# Patient Record
Sex: Female | Born: 1945 | Race: White | Hispanic: No | Smoking: Never smoker
Health system: Southern US, Community
[De-identification: ages and names within clinical notes are randomized; demographics above are authoritative.]

## PROBLEM LIST (undated history)

## (undated) DIAGNOSIS — K219 Gastro-esophageal reflux disease without esophagitis: Secondary | ICD-10-CM

## (undated) DIAGNOSIS — F32A Depression, unspecified: Secondary | ICD-10-CM

## (undated) DIAGNOSIS — C259 Malignant neoplasm of pancreas, unspecified: Secondary | ICD-10-CM

## (undated) DIAGNOSIS — I2699 Other pulmonary embolism without acute cor pulmonale: Secondary | ICD-10-CM

## (undated) DIAGNOSIS — F329 Major depressive disorder, single episode, unspecified: Secondary | ICD-10-CM

## (undated) DIAGNOSIS — M1990 Unspecified osteoarthritis, unspecified site: Secondary | ICD-10-CM

## (undated) DIAGNOSIS — K863 Pseudocyst of pancreas: Secondary | ICD-10-CM

## (undated) DIAGNOSIS — F419 Anxiety disorder, unspecified: Secondary | ICD-10-CM

## (undated) DIAGNOSIS — I499 Cardiac arrhythmia, unspecified: Secondary | ICD-10-CM

## (undated) HISTORY — DX: Other pulmonary embolism without acute cor pulmonale: I26.99

---

## 1972-01-26 HISTORY — PX: TUBAL LIGATION: SHX77

## 2015-10-30 ENCOUNTER — Other Ambulatory Visit: Payer: Self-pay | Admitting: Neurosurgery

## 2015-12-08 ENCOUNTER — Encounter (HOSPITAL_COMMUNITY): Payer: Self-pay | Admitting: *Deleted

## 2015-12-08 ENCOUNTER — Encounter (HOSPITAL_COMMUNITY)
Admission: RE | Admit: 2015-12-08 | Discharge: 2015-12-08 | Disposition: A | Discharge status: Home / Self Care | Payer: Medicare Other | Source: Ambulatory Visit | Attending: Neurosurgery | Admitting: Neurosurgery

## 2015-12-08 DIAGNOSIS — Z01812 Encounter for preprocedural laboratory examination: Secondary | ICD-10-CM | POA: Diagnosis present

## 2015-12-08 HISTORY — DX: Gastro-esophageal reflux disease without esophagitis: K21.9

## 2015-12-08 HISTORY — DX: Unspecified osteoarthritis, unspecified site: M19.90

## 2015-12-08 HISTORY — DX: Anxiety disorder, unspecified: F41.9

## 2015-12-08 HISTORY — DX: Depression, unspecified: F32.A

## 2015-12-08 HISTORY — DX: Cardiac arrhythmia, unspecified: I49.9

## 2015-12-08 HISTORY — DX: Major depressive disorder, single episode, unspecified: F32.9

## 2015-12-08 LAB — CBC
HCT: 42.9 % (ref 36.0–46.0)
Hemoglobin: 14.3 g/dL (ref 12.0–15.0)
MCH: 30.7 pg (ref 26.0–34.0)
MCHC: 33.3 g/dL (ref 30.0–36.0)
MCV: 92.1 fL (ref 78.0–100.0)
Platelets: 212 10*3/uL (ref 150–400)
RBC: 4.66 MIL/uL (ref 3.87–5.11)
RDW: 13.2 % (ref 11.5–15.5)
WBC: 5.3 10*3/uL (ref 4.0–10.5)

## 2015-12-08 LAB — BASIC METABOLIC PANEL
Anion gap: 7 (ref 5–15)
BUN: 21 mg/dL — ABNORMAL HIGH (ref 6–20)
CO2: 27 mmol/L (ref 22–32)
Calcium: 9 mg/dL (ref 8.9–10.3)
Chloride: 106 mmol/L (ref 101–111)
Creatinine, Ser: 0.72 mg/dL (ref 0.44–1.00)
GFR calc Af Amer: >60 mL/min (ref >60)
GFR calc non Af Amer: >60 mL/min (ref >60)
Glucose, Bld: 92 mg/dL (ref 65–99)
Potassium: 4 mmol/L (ref 3.5–5.1)
Sodium: 140 mmol/L (ref 135–145)

## 2015-12-08 LAB — SURGICAL PCR SCREEN
MRSA, PCR: NEGATIVE
Staphylococcus aureus: NEGATIVE

## 2015-12-08 NOTE — Pre-Procedure Instructions (Addendum)
Sheryl Bender  12/08/2015      [id number] [place], [place] - [place] [address] Phone: [phone] Fax: [phone]    Your procedure is scheduled on 12/16/15.  Report to [facility] Admitting at 630 A.M.  Call this number if you have problems the morning of surgery:  [phone]   Remember:  Do not eat food or drink liquids after midnight.  Take these medicines the morning of surgery with A SIP OF WATER lexapro,fluvixamine,ativan if needed, metoprolol, seroquel  STOP all herbel meds, nsaids (aleve,naproxen,advil,ibuprofen) starting 12/09/15 tomorrow including aspirin,vitamins   Do not wear jewelry, make-up or nail polish.  Do not wear lotions, powders, or perfumes, or deoderant.  Do not shave 48 hours prior to surgery.  Men may shave face and neck.  Do not bring valuables to the hospital.  [facility] is not responsible for any belongings or valuables.  Contacts, dentures or bridgework may not be worn into surgery.  Leave your suitcase in the car.  After surgery it may be brought to your room.  For patients admitted to the hospital, discharge time will be determined by your treatment team.  Patients discharged the day of surgery will not be allowed to drive home.    Special instructions:   Special Instructions: [place] - Preparing for Surgery  Before surgery, you can play an important role.  Because skin is not sterile, your skin needs to be as free of germs as possible.  You can reduce the number of germs on you skin by washing with CHG (chlorahexidine gluconate) soap before surgery.  CHG is an antiseptic cleaner which kills germs and bonds with the skin to continue killing germs even after washing.  Please DO NOT use if you have an allergy to CHG or antibacterial soaps.  If your skin becomes reddened/irritated stop using the CHG and inform your nurse when you arrive at Short Stay.  Do not shave (including legs  and underarms) for at least 48 hours prior to the first CHG shower.  You may shave your face.  Please follow these instructions carefully:   1.  Shower with CHG Soap the night before surgery and the morning of Surgery.  2.  If you choose to wash your hair, wash your hair first as usual with your normal shampoo.  3.  After you shampoo, rinse your hair and body thoroughly to remove the Shampoo.  4.  Use CHG as you would any other liquid soap.  You can apply chg directly  to the skin and wash gently with scrungie or a clean washcloth.  5.  Apply the CHG Soap to your body ONLY FROM THE NECK DOWN.  Do not use on open wounds or open sores.  Avoid contact with your eyes ears, mouth and genitals (private parts).  Wash genitals (private parts)       with your normal soap.  6.  Wash thoroughly, paying special attention to the area where your surgery will be performed.  7.  Thoroughly rinse your body with warm water from the neck down.  8.  DO NOT shower/wash with your normal soap after using and rinsing off the CHG Soap.  9.  Pat yourself dry with a clean towel.            10.  Wear clean pajamas.            11.  Place clean sheets on your  bed the night of your first shower and do not sleep with pets.  Day of Surgery  Do not apply any lotions/deodorants the morning of surgery.  Please wear clean clothes to the hospital/surgery center.  Please read over the  fact sheets that you were given.

## 2015-12-09 NOTE — H&P (Signed)
Patient ID:   [id number] [name]  Date of Birth: May 04, 1945 Visit Type: Office Visit   Date: 10/29/2015 08:30 AM Provider: Marchia Meiers. Vertell Limber MD   This 70 year old female presents for back pain.  History of Present Illness: 1.  back pain    Sheryl Bender, 70 year old retired female visits for evaluation.  Initially seen in [place] by Dr. Willey Blade, who left the practice; then seen by Dr. Leafy Kindle who passed away, she visits to discuss right buttock, right lower extremity, right ankle pain increasing since March 2016.  Dr. Leafy Kindle had recommended L4-L5 laminectomies with Coflex. Symptoms began in his right buttock pain radiating to the ankle with increased activity.  Lately, the pain has increased in frequency and severity and includes supine positioning. Pain is an 8/10 today and increases as she walks. She has had a bone density scan done which showed no evidence of osteopenia or osteoporosis.  Of note, patient was voluntarily committed for depression and anxiety August 16 through the 24th in [place].  Symptoms are well controlled at present on medication.  Unfortunately, her pain has not been well controlled recently due to fear of medication interactions.  She was instructed to take only one naproxen per day which offered little benefit.  ESI x1 offered no relief Physical therapy caused increased pain  History: Tachycardia, anxiety/depression Surgical history: Tubal ligation 1974  X-rays on [place] reveal L3-4, L4-5, L5-S1 degeneration and arthritis, L5-S1 is sacralized, L4-5 anterolisthesis , L3-4 retrolisthesis, and L2-3 anterolisthesis  MRI reveals mulitlevel degeneration and arthritis at L2-3, L3-4, and L4-5, L4-5 foraminal stenosis worse on the right      Medical/Surgical/Interim History Reviewed, no change.   PAST MEDICAL HISTORY, SURGICAL HISTORY, FAMILY HISTORY, SOCIAL HISTORY AND REVIEW OF SYSTEMS  10/29/2015, which I have signed.  Family History: Reviewed,  no changes.    Social History: Tobacco use reviewed. Reviewed, no changes.     MEDICATIONS(added, continued or stopped this visit): Started Medication Directions Instruction Stopped   Fish Oil 120 mg-180 mg capsule      Glucosamine     10/28/2015 lorazepam 0.5 mg tablet take 1 tablet by oral route 2 times every day as needed     metoprolol succinate ER 25 mg tablet,extended release 24 hr take 1 tablet by oral route  every day     venlafaxine 50 mg tablet take 1 tablet by oral route  every day     vitamin d2        ALLERGIES: Ingredient Reaction Medication Name Comment  NO KNOWN ALLERGIES     No known allergies.    Vitals Date Temp F BP Pulse Ht In Wt Lb BMI BSA Pain Score  10/29/2015  111/71 68 63 145 25.69  8/10     PHYSICAL EXAM General Level of Distress: no acute distress Overall Appearance: normal    Cardiovascular Cardiac: regular rate and rhythm without murmur  Respiratory Lungs: clear to auscultation  Neurological Recent and Remote Memory: normal Attention Span and Concentration:   normal Language: normal Fund of Knowledge: normal  Right Left Sensation: normal normal Upper Extremity Coordination: normal normal  Lower Extremity Coordination: normal normal  Musculoskeletal Gait and Station: normal  Right Left Upper Extremity Muscle Strength: normal normal Lower Extremity Muscle Strength: normal normal Upper Extremity Muscle Tone:  normal normal Lower Extremity Muscle Tone: normal normal  Motor Strength Upper and lower extremity motor strength was tested in the clinically pertinent muscles. Any abnormal findings will be noted below.  Right Left EHL: 4-/5    Deep Tendon Reflexes  Right Left Biceps: normal normal Triceps: normal normal Brachiloradialis: normal normal Patellar: normal normal Achilles: normal normal  Sensory Sensation was tested at L1 to S1.   Cranial Nerves II. Optic Nerve/Visual Fields: normal III.  Oculomotor: normal IV. Trochlear: normal V. Trigeminal: normal VI. Abducens: normal VII. Facial: normal VIII. Acoustic/Vestibular: normal IX. Glossopharyngeal: normal X. Vagus: normal XI. Spinal Accessory: normal XII. Hypoglossal: normal  Motor and other Tests Lhermittes: negative Rhomberg: negative    Right Left Hoffman's: normal normal Clonus: normal normal Babinski: normal normal SLR: positive negative Patrick's Corky Sox): negative negative Toe Walk: normal normal Toe Lift: normal normal Heel Walk: normal normal SI Joint: nontender nontender   Additional Findings:  Right sciatic notch discomfort, toe touch 16 inches from floor, weak with right leg squat, UE strength is normal, 4/5 right dorsiflexion and hip abductor weakness, symmetric reflexes, hyperesthesia in right LE, positive SLR of 45 degrees on the right   DIAGNOSTIC RESULTS X-rays on [place]opy reveal L3-4, L4-5, L5-S1 degeneration and arthritis, L5-S1 is sacralized, L4-5 anterolisthesis , L3-4 retrolisthesis, and L2-3 anterolisthesis  MRI reveals mulitlevel degeneration and arthritis at L2-3, L3-4, and L4-5, L4-5 foraminal stenosis worse on the right    IMPRESSION The patient is experiencing right buttock and right leg pain that radiates into her ankle. On review of her imaging, she has significant degeneration of L2-3, L3-4, and L4-5 with foraminal stenosis at L4-5 on the right that is causing nerve compression. On confrontational testing, she has right sciatic notch discomfort, right EHL, dorsiflexion, and hip abductor weakness, hyperesthesia in her right LE, and positive SLR on the right. She has tried an epidural injection which offered no relief and PT only caused increased pain. Due to her weakness and significant lumbar degeneration, I recommend an L2-3, L3-4, L4-5 XLIF from the right with percutaneous pedicle screw fixation for alleviation of her right leg symptoms. She has had a bone density scan in the last  year or two, which showed no evidence of osteopenia or osteoporosis. If we are unable to perform an XLIF at L 45, then I would recommend an MIS TLIF at this level from the right.  Completed Orders (this encounter) Order Details Reason Side Interpretation Result Initial Treatment Date Region  Lumbar Spine- AP/Lat/Obls/Spot/Flex/Ex      10/29/2015 All Levels to All Levels  Scoliosis- AP/Lat      10/29/2015 All Levels to All Levels   Assessment/Plan # Detail Type Description   1. Assessment Lumbar radiculopathy (M54.16).       2. 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       3. 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       4. 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         Pain Assessment/Treatment Pain Scale: 8/10. Method: Numeric Pain Intensity Scale. Location: back. Onset: 04/11/2015. Duration: varies. Quality: discomforting. Pain Assessment/Treatment follow-up plan of care: Patient taking over the counter medication for relief..  Fall Risk Plan The patient has not fallen in the last year.  We got scoliosis X-rays today. Schedule L2-3, L3-4, and L4-5 XLIF. Nurse education given. Fitted for LSO brace.   Orders: Diagnostic Procedures: Assessment Procedure  M41.20 Scoliosis- AP/Lat  M54.16 Lumbar Spine- AP/Lat  M54.16 Lumbar Spine- AP/Lat/Obls/Spot/Flex/Ex             Provider:  Marchia Meiers. Vertell Limber MD  10/29/2015 10:34 AM Dictation edited by: Johnella Moloney    CC Providers: Milbert Coulter  [facility] [address] [place] [address]              Electronically signed by Marchia Meiers. Vertell Limber MD on 10/30/2015 05:16 PM

## 2015-12-16 ENCOUNTER — Inpatient Hospital Stay (HOSPITAL_COMMUNITY): Payer: Medicare Other

## 2015-12-16 ENCOUNTER — Inpatient Hospital Stay (HOSPITAL_COMMUNITY): Payer: Medicare Other | Admitting: Certified Registered Nurse Anesthetist

## 2015-12-16 ENCOUNTER — Inpatient Hospital Stay (HOSPITAL_COMMUNITY)
Admission: RE | Admit: 2015-12-16 | Discharge: 2015-12-19 | DRG: 458 | Disposition: A | Discharge status: Home Health | Payer: Medicare Other | Source: Ambulatory Visit | Attending: Neurosurgery | Admitting: Neurosurgery

## 2015-12-16 ENCOUNTER — Encounter (HOSPITAL_COMMUNITY)
Admission: RE | Disposition: A | Discharge status: Home Health | Payer: Self-pay | Source: Ambulatory Visit | Attending: Neurosurgery

## 2015-12-16 ENCOUNTER — Encounter (HOSPITAL_COMMUNITY): Payer: Self-pay | Admitting: General Practice

## 2015-12-16 ENCOUNTER — Inpatient Hospital Stay (HOSPITAL_COMMUNITY): Payer: Medicare Other | Admitting: Emergency Medicine

## 2015-12-16 DIAGNOSIS — M4126 Other idiopathic scoliosis, lumbar region: Secondary | ICD-10-CM | POA: Diagnosis present

## 2015-12-16 DIAGNOSIS — M4316 Spondylolisthesis, lumbar region: Secondary | ICD-10-CM | POA: Diagnosis present

## 2015-12-16 DIAGNOSIS — M48061 Spinal stenosis, lumbar region without neurogenic claudication: Secondary | ICD-10-CM | POA: Diagnosis present

## 2015-12-16 DIAGNOSIS — F419 Anxiety disorder, unspecified: Secondary | ICD-10-CM | POA: Diagnosis present

## 2015-12-16 DIAGNOSIS — M419 Scoliosis, unspecified: Secondary | ICD-10-CM | POA: Diagnosis present

## 2015-12-16 DIAGNOSIS — F329 Major depressive disorder, single episode, unspecified: Secondary | ICD-10-CM | POA: Diagnosis present

## 2015-12-16 DIAGNOSIS — M5116 Intervertebral disc disorders with radiculopathy, lumbar region: Secondary | ICD-10-CM | POA: Diagnosis present

## 2015-12-16 DIAGNOSIS — Z419 Encounter for procedure for purposes other than remedying health state, unspecified: Secondary | ICD-10-CM

## 2015-12-16 DIAGNOSIS — M549 Dorsalgia, unspecified: Secondary | ICD-10-CM | POA: Diagnosis present

## 2015-12-16 HISTORY — PX: ANTERIOR LAT LUMBAR FUSION: SHX1168

## 2015-12-16 HISTORY — PX: LUMBAR PERCUTANEOUS PEDICLE SCREW 3 LEVEL: SHX5562

## 2015-12-16 LAB — ABO/RH: ABO/RH(D): A POS

## 2015-12-16 LAB — TYPE AND SCREEN
ABO/RH(D): A POS
Antibody Screen: NEGATIVE

## 2015-12-16 SURGERY — ANTERIOR LATERAL LUMBAR FUSION 3 LEVELS
Anesthesia: General | Site: Flank | Laterality: Right

## 2015-12-16 MED ORDER — SENNOSIDES-DOCUSATE SODIUM 8.6-50 MG PO TABS
1.0000 | ORAL_TABLET | Freq: Every evening | ORAL | Status: DC | PRN
  Administered 2015-12-19: 1 via ORAL
  Filled 2015-12-16: qty 1

## 2015-12-16 MED ORDER — SODIUM CHLORIDE 0.9 % IV SOLN
0.0125 ug/kg/min | INTRAVENOUS | Status: AC
  Administered 2015-12-16: 11:00:00 via INTRAVENOUS
  Administered 2015-12-16: .2 ug/kg/min via INTRAVENOUS
  Filled 2015-12-16: qty 2000

## 2015-12-16 MED ORDER — GLYCOPYRROLATE 0.2 MG/ML IJ SOLN
INTRAMUSCULAR | Status: DC | PRN
  Administered 2015-12-16 (×2): 0.2 mg via INTRAVENOUS

## 2015-12-16 MED ORDER — FENTANYL CITRATE (PF) 100 MCG/2ML IJ SOLN
INTRAMUSCULAR | Status: AC
  Filled 2015-12-16: qty 2

## 2015-12-16 MED ORDER — LIDOCAINE HCL (CARDIAC) 20 MG/ML IV SOLN
INTRAVENOUS | Status: DC | PRN
  Administered 2015-12-16: 80 mg via INTRAVENOUS

## 2015-12-16 MED ORDER — OXYCODONE-ACETAMINOPHEN 5-325 MG PO TABS
1.0000 | ORAL_TABLET | ORAL | Status: DC | PRN
  Administered 2015-12-17 – 2015-12-19 (×9): 2 via ORAL
  Filled 2015-12-16 (×9): qty 2

## 2015-12-16 MED ORDER — BUPIVACAINE HCL (PF) 0.5 % IJ SOLN
INTRAMUSCULAR | Status: AC
  Filled 2015-12-16: qty 30

## 2015-12-16 MED ORDER — PROMETHAZINE HCL 25 MG/ML IJ SOLN
6.2500 mg | INTRAMUSCULAR | Status: DC | PRN
  Administered 2015-12-16: 6.25 mg via INTRAVENOUS

## 2015-12-16 MED ORDER — SODIUM CHLORIDE 0.9 % IV SOLN: 250.0000 mL | INTRAVENOUS | Status: DC

## 2015-12-16 MED ORDER — CEFAZOLIN SODIUM-DEXTROSE 2-4 GM/100ML-% IV SOLN
2.0000 g | INTRAVENOUS | Status: AC
  Administered 2015-12-16: 2 g via INTRAVENOUS
  Filled 2015-12-16: qty 100

## 2015-12-16 MED ORDER — HYDROMORPHONE HCL 1 MG/ML IJ SOLN
0.5000 mg | INTRAMUSCULAR | Status: DC | PRN
  Administered 2015-12-16 – 2015-12-18 (×3): 1 mg via INTRAVENOUS
  Filled 2015-12-16 (×3): qty 1

## 2015-12-16 MED ORDER — QUETIAPINE FUMARATE 25 MG PO TABS
12.5000 mg | ORAL_TABLET | Freq: Two times a day (BID) | ORAL | Status: DC
  Administered 2015-12-17 – 2015-12-19 (×6): 12.5 mg via ORAL
  Filled 2015-12-16 (×6): qty 1

## 2015-12-16 MED ORDER — SODIUM CHLORIDE 0.9% FLUSH
3.0000 mL | Freq: Two times a day (BID) | INTRAVENOUS | Status: DC
  Administered 2015-12-16 – 2015-12-19 (×5): 3 mL via INTRAVENOUS

## 2015-12-16 MED ORDER — THROMBIN 5000 UNITS EX SOLR
OROMUCOSAL | Status: DC | PRN
  Administered 2015-12-16: 11:00:00 via TOPICAL

## 2015-12-16 MED ORDER — CEFAZOLIN IN D5W 1 GM/50ML IV SOLN
1.0000 g | Freq: Three times a day (TID) | INTRAVENOUS | Status: AC
  Administered 2015-12-16 – 2015-12-17 (×2): 1 g via INTRAVENOUS
  Filled 2015-12-16 (×2): qty 50

## 2015-12-16 MED ORDER — LOPERAMIDE HCL 2 MG PO CAPS: 2.0000 mg | ORAL_CAPSULE | ORAL | Status: DC | PRN

## 2015-12-16 MED ORDER — EPHEDRINE SULFATE 50 MG/ML IJ SOLN
INTRAMUSCULAR | Status: DC | PRN
  Administered 2015-12-16: 10 mg via INTRAVENOUS
  Administered 2015-12-16 (×2): 5 mg via INTRAVENOUS

## 2015-12-16 MED ORDER — BISACODYL 10 MG RE SUPP: 10.0000 mg | Freq: Every day | RECTAL | Status: DC | PRN

## 2015-12-16 MED ORDER — SODIUM CHLORIDE 0.9 % IV SOLN
0.0125 ug/kg/min | INTRAVENOUS | Status: DC
  Filled 2015-12-16: qty 2000

## 2015-12-16 MED ORDER — LIDOCAINE 2% (20 MG/ML) 5 ML SYRINGE
INTRAMUSCULAR | Status: AC
  Filled 2015-12-16: qty 15

## 2015-12-16 MED ORDER — CHLORHEXIDINE GLUCONATE CLOTH 2 % EX PADS: 6.0000 | MEDICATED_PAD | Freq: Once | CUTANEOUS | Status: DC

## 2015-12-16 MED ORDER — PROPOFOL 10 MG/ML IV BOLUS
INTRAVENOUS | Status: DC | PRN
  Administered 2015-12-16: 140 mg via INTRAVENOUS

## 2015-12-16 MED ORDER — METOPROLOL SUCCINATE ER 25 MG PO TB24
25.0000 mg | ORAL_TABLET | Freq: Every day | ORAL | Status: DC
  Administered 2015-12-17 – 2015-12-19 (×2): 25 mg via ORAL
  Filled 2015-12-16 (×4): qty 1

## 2015-12-16 MED ORDER — 0.9 % SODIUM CHLORIDE (POUR BTL) OPTIME
TOPICAL | Status: DC | PRN
  Administered 2015-12-16: 1000 mL

## 2015-12-16 MED ORDER — FENTANYL CITRATE (PF) 100 MCG/2ML IJ SOLN
INTRAMUSCULAR | Status: AC
Start: 2015-12-16 — End: 2015-12-16
  Filled 2015-12-16: qty 2

## 2015-12-16 MED ORDER — LIDOCAINE-EPINEPHRINE (PF) 2 %-1:200000 IJ SOLN
INTRAMUSCULAR | Status: AC
  Filled 2015-12-16: qty 20

## 2015-12-16 MED ORDER — FLUVOXAMINE MALEATE 100 MG PO TABS
200.0000 mg | ORAL_TABLET | Freq: Every day | ORAL | Status: DC
  Administered 2015-12-17 – 2015-12-19 (×3): 200 mg via ORAL
  Filled 2015-12-16 (×3): qty 2

## 2015-12-16 MED ORDER — DEXAMETHASONE SODIUM PHOSPHATE 10 MG/ML IJ SOLN
INTRAMUSCULAR | Status: DC | PRN
  Administered 2015-12-16: 8 mg via INTRAVENOUS

## 2015-12-16 MED ORDER — THROMBIN 5000 UNITS EX SOLR
CUTANEOUS | Status: AC
  Filled 2015-12-16: qty 5000

## 2015-12-16 MED ORDER — FLEET ENEMA 7-19 GM/118ML RE ENEM: 1.0000 | ENEMA | Freq: Once | RECTAL | Status: DC | PRN

## 2015-12-16 MED ORDER — SODIUM CHLORIDE 0.9% FLUSH: 3.0000 mL | INTRAVENOUS | Status: DC | PRN

## 2015-12-16 MED ORDER — BUPIVACAINE LIPOSOME 1.3 % IJ SUSP
20.0000 mL | INTRAMUSCULAR | Status: AC
  Administered 2015-12-16: 20 mL
  Filled 2015-12-16: qty 20

## 2015-12-16 MED ORDER — ACETAMINOPHEN 500 MG PO TABS: 1000.0000 mg | ORAL_TABLET | Freq: Four times a day (QID) | ORAL | Status: DC | PRN

## 2015-12-16 MED ORDER — ONDANSETRON HCL 4 MG/2ML IJ SOLN
INTRAMUSCULAR | Status: DC | PRN
  Administered 2015-12-16: 4 mg via INTRAVENOUS

## 2015-12-16 MED ORDER — METHOCARBAMOL 500 MG PO TABS
500.0000 mg | ORAL_TABLET | Freq: Four times a day (QID) | ORAL | Status: DC | PRN
  Administered 2015-12-16 – 2015-12-19 (×6): 500 mg via ORAL
  Filled 2015-12-16 (×6): qty 1

## 2015-12-16 MED ORDER — FENTANYL CITRATE (PF) 100 MCG/2ML IJ SOLN
INTRAMUSCULAR | Status: DC | PRN
  Administered 2015-12-16: 50 ug via INTRAVENOUS
  Administered 2015-12-16: 100 ug via INTRAVENOUS
  Administered 2015-12-16: 50 ug via INTRAVENOUS

## 2015-12-16 MED ORDER — QUETIAPINE FUMARATE 25 MG PO TABS
25.0000 mg | ORAL_TABLET | Freq: Every day | ORAL | Status: DC
  Administered 2015-12-16 – 2015-12-18 (×3): 25 mg via ORAL
  Filled 2015-12-16 (×3): qty 1

## 2015-12-16 MED ORDER — MENTHOL 3 MG MT LOZG: 1.0000 | LOZENGE | OROMUCOSAL | Status: DC | PRN

## 2015-12-16 MED ORDER — ALUM & MAG HYDROXIDE-SIMETH 200-200-20 MG/5ML PO SUSP: 30.0000 mL | Freq: Four times a day (QID) | ORAL | Status: DC | PRN

## 2015-12-16 MED ORDER — LIDOCAINE-EPINEPHRINE (PF) 2 %-1:200000 IJ SOLN
INTRAMUSCULAR | Status: DC | PRN
  Administered 2015-12-16 (×2): 4 mL
  Administered 2015-12-16: 5 mL

## 2015-12-16 MED ORDER — HYDROCODONE-ACETAMINOPHEN 5-325 MG PO TABS: 1.0000 | ORAL_TABLET | ORAL | Status: DC | PRN

## 2015-12-16 MED ORDER — ACETAMINOPHEN 650 MG RE SUPP: 650.0000 mg | RECTAL | Status: DC | PRN

## 2015-12-16 MED ORDER — PROPOFOL 10 MG/ML IV BOLUS
INTRAVENOUS | Status: AC
  Filled 2015-12-16: qty 20

## 2015-12-16 MED ORDER — ACETAMINOPHEN 325 MG PO TABS: 650.0000 mg | ORAL_TABLET | ORAL | Status: DC | PRN

## 2015-12-16 MED ORDER — ZOLPIDEM TARTRATE 5 MG PO TABS
5.0000 mg | ORAL_TABLET | Freq: Every evening | ORAL | Status: DC | PRN
  Administered 2015-12-16: 5 mg via ORAL
  Filled 2015-12-16: qty 1

## 2015-12-16 MED ORDER — PROMETHAZINE HCL 25 MG/ML IJ SOLN
INTRAMUSCULAR | Status: AC
  Filled 2015-12-16: qty 1

## 2015-12-16 MED ORDER — METHOCARBAMOL 1000 MG/10ML IJ SOLN
500.0000 mg | Freq: Four times a day (QID) | INTRAVENOUS | Status: DC | PRN
  Filled 2015-12-16: qty 5

## 2015-12-16 MED ORDER — LACTATED RINGERS IV SOLN
INTRAVENOUS | Status: DC
  Administered 2015-12-16 (×4): via INTRAVENOUS

## 2015-12-16 MED ORDER — KCL IN DEXTROSE-NACL 20-5-0.45 MEQ/L-%-% IV SOLN
INTRAVENOUS | Status: DC
  Administered 2015-12-16: 17:00:00 via INTRAVENOUS
  Filled 2015-12-16 (×2): qty 1000

## 2015-12-16 MED ORDER — ACETAMINOPHEN 10 MG/ML IV SOLN
1000.0000 mg | Freq: Once | INTRAVENOUS | Status: AC
  Administered 2015-12-16: 1000 mg via INTRAVENOUS
  Filled 2015-12-16: qty 100

## 2015-12-16 MED ORDER — PANTOPRAZOLE SODIUM 40 MG IV SOLR
40.0000 mg | Freq: Every day | INTRAVENOUS | Status: DC
  Administered 2015-12-16: 40 mg via INTRAVENOUS
  Filled 2015-12-16: qty 40

## 2015-12-16 MED ORDER — ONDANSETRON HCL 4 MG/2ML IJ SOLN
4.0000 mg | INTRAMUSCULAR | Status: DC | PRN
  Administered 2015-12-16 (×2): 4 mg via INTRAVENOUS
  Filled 2015-12-16 (×2): qty 2

## 2015-12-16 MED ORDER — ESCITALOPRAM OXALATE 10 MG PO TABS
20.0000 mg | ORAL_TABLET | Freq: Every day | ORAL | Status: DC
  Administered 2015-12-17 – 2015-12-19 (×3): 20 mg via ORAL
  Filled 2015-12-16 (×3): qty 2

## 2015-12-16 MED ORDER — PHENYLEPHRINE HCL 10 MG/ML IJ SOLN
INTRAVENOUS | Status: DC | PRN
  Administered 2015-12-16: 25 ug/min via INTRAVENOUS

## 2015-12-16 MED ORDER — LORAZEPAM 0.5 MG PO TABS
0.2500 mg | ORAL_TABLET | Freq: Four times a day (QID) | ORAL | Status: DC | PRN
  Administered 2015-12-16 – 2015-12-19 (×9): 0.25 mg via ORAL
  Filled 2015-12-16 (×9): qty 1

## 2015-12-16 MED ORDER — FENTANYL CITRATE (PF) 100 MCG/2ML IJ SOLN
25.0000 ug | INTRAMUSCULAR | Status: DC | PRN
  Administered 2015-12-16: 25 ug via INTRAVENOUS
  Administered 2015-12-16: 50 ug via INTRAVENOUS
  Administered 2015-12-16: 25 ug via INTRAVENOUS

## 2015-12-16 MED ORDER — BUPIVACAINE HCL (PF) 0.5 % IJ SOLN
INTRAMUSCULAR | Status: DC | PRN
  Administered 2015-12-16: 5 mL
  Administered 2015-12-16 (×2): 4 mL

## 2015-12-16 MED ORDER — PHENOL 1.4 % MT LIQD: 1.0000 | OROMUCOSAL | Status: DC | PRN

## 2015-12-16 MED ORDER — DOCUSATE SODIUM 100 MG PO CAPS
100.0000 mg | ORAL_CAPSULE | Freq: Two times a day (BID) | ORAL | Status: DC
  Administered 2015-12-16 – 2015-12-19 (×6): 100 mg via ORAL
  Filled 2015-12-16 (×6): qty 1

## 2015-12-16 SURGICAL SUPPLY — 92 items
BLADE CLIPPER SURG (BLADE) IMPLANT
CARTRIDGE OIL MAESTRO DRILL (MISCELLANEOUS) IMPLANT
CLIP NEUROVISION LG (CLIP) ×4 IMPLANT
CONT SPEC 4OZ CLIKSEAL STRL BL (MISCELLANEOUS) ×4 IMPLANT
CORENT WIDE 10X22X55 (Orthopedic Implant) ×4 IMPLANT
COROENT WIDE 10X22X55 (Orthopedic Implant) ×2 IMPLANT
COUNTER NEEDLE 20 DBL MAG RED (NEEDLE) ×4 IMPLANT
COVER BACK TABLE 24X17X13 BIG (DRAPES) IMPLANT
COVER BACK TABLE 60X90IN (DRAPES) ×4 IMPLANT
DECANTER SPIKE VIAL GLASS SM (MISCELLANEOUS) ×8 IMPLANT
DERMABOND ADVANCED (GAUZE/BANDAGES/DRESSINGS) ×8
DERMABOND ADVANCED .7 DNX12 (GAUZE/BANDAGES/DRESSINGS) ×8 IMPLANT
DIFFUSER DRILL AIR PNEUMATIC (MISCELLANEOUS) IMPLANT
DRAPE C-ARM 42X72 X-RAY (DRAPES) ×8 IMPLANT
DRAPE C-ARMOR (DRAPES) ×8 IMPLANT
DRAPE LAPAROTOMY 100X72X124 (DRAPES) ×8 IMPLANT
DRAPE POUCH INSTRU U-SHP 10X18 (DRAPES) ×8 IMPLANT
DRAPE SURG 17X23 STRL (DRAPES) ×4 IMPLANT
DRSG OPSITE POSTOP 3X4 (GAUZE/BANDAGES/DRESSINGS) ×8 IMPLANT
DRSG OPSITE POSTOP 4X6 (GAUZE/BANDAGES/DRESSINGS) ×12 IMPLANT
DURAPREP 26ML APPLICATOR (WOUND CARE) ×8 IMPLANT
ELECT REM PT RETURN 9FT ADLT (ELECTROSURGICAL) ×8
ELECTRODE REM PT RTRN 9FT ADLT (ELECTROSURGICAL) ×4 IMPLANT
GAUZE SPONGE 4X4 12PLY STRL (GAUZE/BANDAGES/DRESSINGS) IMPLANT
GAUZE SPONGE 4X4 16PLY XRAY LF (GAUZE/BANDAGES/DRESSINGS) ×4 IMPLANT
GLOVE BIO SURGEON STRL SZ8 (GLOVE) ×8 IMPLANT
GLOVE BIOGEL PI IND STRL 7.0 (GLOVE) ×2 IMPLANT
GLOVE BIOGEL PI IND STRL 7.5 (GLOVE) ×2 IMPLANT
GLOVE BIOGEL PI IND STRL 8 (GLOVE) ×10 IMPLANT
GLOVE BIOGEL PI IND STRL 8.5 (GLOVE) ×4 IMPLANT
GLOVE BIOGEL PI INDICATOR 7.0 (GLOVE) ×2
GLOVE BIOGEL PI INDICATOR 7.5 (GLOVE) ×2
GLOVE BIOGEL PI INDICATOR 8 (GLOVE) ×10
GLOVE BIOGEL PI INDICATOR 8.5 (GLOVE) ×4
GLOVE ECLIPSE 7.5 STRL STRAW (GLOVE) ×12 IMPLANT
GLOVE ECLIPSE 8.0 STRL XLNG CF (GLOVE) ×8 IMPLANT
GLOVE EXAM NITRILE LRG STRL (GLOVE) IMPLANT
GLOVE EXAM NITRILE XL STR (GLOVE) IMPLANT
GLOVE EXAM NITRILE XS STR PU (GLOVE) IMPLANT
GLOVE SS BIOGEL STRL SZ 7.5 (GLOVE) ×2 IMPLANT
GLOVE SUPERSENSE BIOGEL SZ 7.5 (GLOVE) ×2
GLOVE SURG SS PI 7.0 STRL IVOR (GLOVE) ×12 IMPLANT
GOWN STRL REUS W/ TWL LRG LVL3 (GOWN DISPOSABLE) ×4 IMPLANT
GOWN STRL REUS W/ TWL XL LVL3 (GOWN DISPOSABLE) ×4 IMPLANT
GOWN STRL REUS W/TWL 2XL LVL3 (GOWN DISPOSABLE) ×12 IMPLANT
GOWN STRL REUS W/TWL LRG LVL3 (GOWN DISPOSABLE) ×4
GOWN STRL REUS W/TWL XL LVL3 (GOWN DISPOSABLE) ×4
GUIDEWIRE NITINOL BEVEL TIP (WIRE) ×32 IMPLANT
HEMOSTAT POWDER KIT SURGIFOAM (HEMOSTASIS) ×4 IMPLANT
IMPL COROENT LDTXL 10X18X55 (Intraocular Lens) ×2 IMPLANT
IMPLANT COROENT LDTXL 10X18X55 (Intraocular Lens) ×4 IMPLANT
IMPLANT COROENT XL 10X22X50MM (Intraocular Lens) ×4 IMPLANT
KIT BASIN OR (CUSTOM PROCEDURE TRAY) ×4 IMPLANT
KIT DILATOR XLIF 5 (KITS) ×2 IMPLANT
KIT INFUSE SMALL (Orthopedic Implant) ×4 IMPLANT
KIT POSITION SURG JACKSON T1 (MISCELLANEOUS) ×4 IMPLANT
KIT ROOM TURNOVER OR (KITS) ×4 IMPLANT
KIT SURGICAL ACCESS MAXCESS 4 (KITS) ×4 IMPLANT
KIT XLIF (KITS) ×2
MARKER SKIN DUAL TIP RULER LAB (MISCELLANEOUS) ×8 IMPLANT
MODULE NVM5 NEXT GEN EMG (NEEDLE) ×4 IMPLANT
NEEDLE HYPO 21X1.5 SAFETY (NEEDLE) ×4 IMPLANT
NEEDLE HYPO 25X1 1.5 SAFETY (NEEDLE) ×8 IMPLANT
NEEDLE I PASS (NEEDLE) ×4 IMPLANT
NS IRRIG 1000ML POUR BTL (IV SOLUTION) ×4 IMPLANT
OIL CARTRIDGE MAESTRO DRILL (MISCELLANEOUS)
PACK LAMINECTOMY NEURO (CUSTOM PROCEDURE TRAY) ×8 IMPLANT
PAD ARMBOARD 7.5X6 YLW CONV (MISCELLANEOUS) ×12 IMPLANT
PATTIES SURGICAL .5 X.5 (GAUZE/BANDAGES/DRESSINGS) IMPLANT
PATTIES SURGICAL .5 X1 (DISPOSABLE) IMPLANT
PATTIES SURGICAL 1X1 (DISPOSABLE) IMPLANT
PUTTY BONE ATTRAX 10CC STRIP (Putty) ×4 IMPLANT
PUTTY BONE ATTRAX 5CC STRIP (Putty) ×4 IMPLANT
ROD RELINE MAS LORD 5.5X100MM (Rod) ×8 IMPLANT
SCREW LOCK RELINE 5.5 TULIP (Screw) ×32 IMPLANT
SCREW MAS RELINE POLY 6.5X40 (Screw) ×16 IMPLANT
SCREW RELINE MAAS POLY 6.5X35 (Screw) ×8 IMPLANT
SCREW SHANK MAS MOD 6.5X40MM (Screw) ×8 IMPLANT
SPINE TULIP RELINE MOD (Neuro Prosthesis/Implant) ×8 IMPLANT
SPONGE LAP 4X18 X RAY DECT (DISPOSABLE) IMPLANT
STAPLER SKIN PROX WIDE 3.9 (STAPLE) ×4 IMPLANT
SUT VIC AB 1 CT1 18XBRD ANBCTR (SUTURE) ×4 IMPLANT
SUT VIC AB 1 CT1 8-18 (SUTURE) ×4
SUT VIC AB 2-0 CT1 18 (SUTURE) ×20 IMPLANT
SUT VIC AB 3-0 SH 8-18 (SUTURE) ×20 IMPLANT
SYR 20CC LL (SYRINGE) ×4 IMPLANT
SYR INSULIN 1ML 31GX6 SAFETY (SYRINGE) IMPLANT
TOWEL OR 17X24 6PK STRL BLUE (TOWEL DISPOSABLE) ×4 IMPLANT
TOWEL OR 17X26 10 PK STRL BLUE (TOWEL DISPOSABLE) ×8 IMPLANT
TRAY FOLEY W/METER SILVER 16FR (SET/KITS/TRAYS/PACK) ×4 IMPLANT
TULIP SPINE RELINE MOD (Neuro Prosthesis/Implant) ×4 IMPLANT
WATER STERILE IRR 1000ML POUR (IV SOLUTION) ×4 IMPLANT

## 2015-12-16 NOTE — Interval H&P Note (Signed)
History and Physical Interval Note:  12/16/2015 7:53 AM  Darrol Jump  has presented today for surgery, with the diagnosis of SPONDYLOLISTHESIS, LUMBAR REGION  The various methods of treatment have been discussed with the patient and family. After consideration of risks, benefits and other options for treatment, the patient has consented to  Procedure(s) with comments: RIGHT L2-3 L3-4 L4-5 ANTEROLATERAL LUMBAR INTERBODY FUSION/POSSIBLE MINIMALLY INVASIVE TRANSFORAMINAL LUMBAR INTERBODY FUSION AT L4-5/PERCUTANEOUS PEDICLE SCREWS BILATERALLY AT L2-5 (Right) - RIGHT L2-3 L3-4 L4-5 ANTEROLATERAL LUMBAR INTERBODY FUSION/POSSIBLE MINIMALLY INVASIVE TRANSFORAMINAL LUMBAR INTERBODY FUSION AT L4-5/PERCUTANEOUS PEDICLE SCREWS BILATERALLY AT L2-5 PERCUTANEOUS PEDICLE SCREWS BILATERALLY AT L2-5 (Bilateral) - PERCUTANEOUS PEDICLE SCREWS BILATERALLY AT L2-5 as a surgical intervention .  The patient's history has been reviewed, patient examined, no change in status, stable for surgery.  I have reviewed the patient's chart and labs.  Questions were answered to the patient's satisfaction.     Anitra Doxtater D

## 2015-12-16 NOTE — Brief Op Note (Signed)
12/16/2015  1:39 PM  PATIENT:  Sheryl Bender  70 y.o. female  PRE-OPERATIVE DIAGNOSIS:  SPONDYLOLISTHESIS, [place], scoliosis, stenosis, lumbago, radiculopathy L 23, L 34, L 45 levels  POST-OPERATIVE DIAGNOSIS:  SPONDYLOLISTHESIS, LUMBAR REGION, scoliosis, stenosis, lumbago, radiculopathy L 23, L 34, L 45 levels  PROCEDURE:  Procedure(s): RIGHT LUMBAR TWO-THREE, LUMBAR THREE-FOUR, LUMBAR FOUR-FIVE ANTEROLATERAL LUMBAR INTERBODY FUSION (Right) PERCUTANEOUS PEDICLE SCREWS BILATERALLY AT LUMBAR TWO-FIVE (Bilateral)  SURGEON:  Surgeon(s) and Role:    * Erline Levine, MD - Primary    * Kevan Ny Ditty, MD - Assisting  PHYSICIAN ASSISTANT:   ASSISTANTS: [place], RN   ANESTHESIA:   general  EBL:  Total I/O In: 2100 [I.V.:2100] Out: 1035 [Urine:955; Blood:80]  BLOOD ADMINISTERED:none  DRAINS: none   LOCAL MEDICATIONS USED:  MARCAINE    and LIDOCAINE   SPECIMEN:  No Specimen  DISPOSITION OF SPECIMEN:  N/A  COUNTS:  YES  TOURNIQUET:  * No tourniquets in log *  DICTATION: DICTATION: Patient is a 70 year old with 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 It was elected to take her to surgery for anterolateral decompression and posterior pedicle screw fixation.  Procedure: Patient was brought to the operating room and placed in a left lateral decubitus position on the operative table and using orthogonally projected C-arm fluoroscopy the patient was placed so that the L2-3 L3-4 and L4-5 levels were visualized in AP and lateral plane. The patient was then taped into position. The table was flexed so as to expose the L4-5 level as the patient has a high iliac crest. Skin was marked along with a posterior finger dissection incision. His flank was then prepped and draped in usual sterile fashion and incisions were made sequentially at L4-5 L3-4 and L2-3 levels. Posterior finger dissection was made to enter the retroperitoneal space and then subsequently the  probe was inserted into the psoas muscle from the right side initially at the L4-5 level. After mapping the neural elements were able to dock the probe per the midpoint of this vertebral level and without indications electrically of too close proximity to the neural tissues. Subsequently the self-retaining tractor was.after sequential dilators were utilized the shim was employed and the interspace was cleared of psoas muscle and then incised. A thorough discectomy was performed. Angled instruments were used to clear the interspace of disc material. After thorough discectomy was performed and this was performed using AP and lateral fluoroscopy a 10 lordotic by 55 x 18 mm implant was packed with BMP and Attrax. This was tamped into position using the slides and its position was confirmed on AP and lateral fluoroscopy. Subsequently exposure was performed at the L3-4 level and similar dissection was performed with locking of the self-retaining retractor. At this level were able to place a 10 lordotic by 22 x 55 mm implant packed in a similar fashion. At the L2-3 level were able to place an 8 mm standard by 50 x 22 mm implant packed in a similar fashion. He states his was assured the wounds were irrigated interrupted Vicryl sutures.  Patient was then turned into a prone position on the operating table on [place] table and using AP and lateral fluoroscopy throughout this portion of the procedure, pedicle screws were placed using Nuvasive Reline cannulated percutaneous screws. 2 screws were placed at L2 and (6.5 x 40 mm) and 2 at L3  (6.5x 40 left and 6.5 x 35 right) and 2 at L4 of a similar size and 2 at  L5 (6.5 x 40 mm). 100 mm rods were then affixed to the screw heads do a separate stab incision and locked down on the screws. All connections were then torqued and the Towers were disassembled. The wounds were irrigated and then closed with 1, 2-0 and 3-0 Vicryl stitches with long-acting Marcaine in the deep  musculature. Sterile occlusive dressing was placed with Dermabond. The patient was then extubated in the operating room and taken to recovery in stable and satisfactory condition having tolerated his operation well. Counts were correct at the end of the case.   PLAN OF CARE: Admit to inpatient   PATIENT DISPOSITION:  PACU - hemodynamically stable.   Delay start of Pharmacological VTE agent (>24hrs) due to surgical blood loss or risk of bleeding: yes

## 2015-12-16 NOTE — Progress Notes (Signed)
Awake, alert, conversant.  MAEW with good strength.  Doing well. 

## 2015-12-16 NOTE — Anesthesia Postprocedure Evaluation (Signed)
Anesthesia Post Note  [name]  Procedure(s) Performed: Procedure(s) (LRB): RIGHT LUMBAR TWO-THREE, LUMBAR THREE-FOUR, LUMBAR FOUR-FIVE ANTEROLATERAL LUMBAR INTERBODY FUSION (Right) PERCUTANEOUS PEDICLE SCREWS BILATERALLY AT LUMBAR TWO-FIVE (Bilateral)  Patient location during evaluation: PACU Anesthesia Type: General Level of consciousness: awake and alert Pain management: pain level controlled Vital Signs Assessment: post-procedure vital signs reviewed and stable Respiratory status: spontaneous breathing, nonlabored ventilation, respiratory function stable and patient connected to nasal cannula oxygen Cardiovascular status: blood pressure returned to baseline and stable Postop Assessment: no signs of nausea or vomiting Anesthetic complications: no    Last Vitals:  Vitals:   12/16/15 1415 12/16/15 1445  BP: (!) 143/93 (!) 145/78  Pulse: 71   Resp: 16 16  Temp: 36.3 C     Last Pain:  Vitals:   12/16/15 1445  TempSrc:   PainSc: [place]

## 2015-12-16 NOTE — Progress Notes (Deleted)
Patient admitted from PACU. Patient alert and oriented x 4. Patient oriented to room and made comfortable. 

## 2015-12-16 NOTE — Anesthesia Preprocedure Evaluation (Addendum)
Anesthesia Evaluation  Patient identified by MRN, date of birth, ID band Patient awake    Reviewed: Allergy & Precautions, NPO status , Patient's Chart, lab work & pertinent test results  Airway Mallampati: II  TM Distance: >3 FB Neck ROM: Full    Dental no notable dental hx.    Pulmonary neg pulmonary ROS,    Pulmonary exam normal        Cardiovascular negative cardio ROS Normal cardiovascular exam+ dysrhythmias (taking metoprolol for sinus tachycardia, EKG reviewed and showing NSR)      Neuro/Psych PSYCHIATRIC DISORDERS Anxiety Depression negative neurological ROS     GI/Hepatic Neg liver ROS, GERD  Medicated and Controlled,  Endo/Other  negative endocrine ROS  Renal/GU negative Renal ROS     Musculoskeletal negative musculoskeletal ROS (+) Arthritis ,   Abdominal   Peds  Hematology negative hematology ROS (+)   Anesthesia Other Findings   Reproductive/Obstetrics                            Anesthesia Physical Anesthesia Plan  ASA: II  Anesthesia Plan: General   Post-op Pain Management:    Induction: Intravenous  Airway Management Planned: Oral ETT  Additional Equipment: Arterial line  Intra-op Plan:   Post-operative Plan: Extubation in OR  Informed Consent: I have reviewed the patients History and Physical, chart, labs and discussed the procedure including the risks, benefits and alternatives for the proposed anesthesia with the patient or authorized representative who has indicated his/her understanding and acceptance.   Dental advisory given  Plan Discussed with: CRNA and Surgeon  Anesthesia Plan Comments:         Anesthesia Quick Evaluation

## 2015-12-16 NOTE — Transfer of Care (Signed)
Immediate Anesthesia Transfer of Care Note  [name]  Procedure(s) Performed: Procedure(s): RIGHT LUMBAR TWO-THREE, LUMBAR THREE-FOUR, LUMBAR FOUR-FIVE ANTEROLATERAL LUMBAR INTERBODY FUSION (Right) PERCUTANEOUS PEDICLE SCREWS BILATERALLY AT LUMBAR TWO-FIVE (Bilateral)  Patient Location: PACU  Anesthesia Type:General  Level of Consciousness: awake and patient cooperative  Airway & Oxygen Therapy: Patient Spontanous Breathing and Patient connected to face mask oxygen  Post-op Assessment: Report given to RN and Post -op Vital signs reviewed and stable  Post vital signs: Reviewed and stable  Last Vitals:  Vitals:   12/16/15 0648  BP: (!) 128/49  Pulse: (!) 54  Resp: 20  Temp: 36.5 C    Last Pain:  Vitals:   12/16/15 0648  TempSrc: Oral      Patients Stated Pain Goal: 3 ([id number])  Complications: No apparent anesthesia complications

## 2015-12-16 NOTE — Op Note (Signed)
12/16/2015  1:39 PM  PATIENT:  Sheryl Bender  70 y.o. female  PRE-OPERATIVE DIAGNOSIS:  SPONDYLOLISTHESIS, [place], scoliosis, stenosis, lumbago, radiculopathy L 23, L 34, L 45 levels  POST-OPERATIVE DIAGNOSIS:  SPONDYLOLISTHESIS, LUMBAR REGION, scoliosis, stenosis, lumbago, radiculopathy L 23, L 34, L 45 levels  PROCEDURE:  Procedure(s): RIGHT LUMBAR TWO-THREE, LUMBAR THREE-FOUR, LUMBAR FOUR-FIVE ANTEROLATERAL LUMBAR INTERBODY FUSION (Right) PERCUTANEOUS PEDICLE SCREWS BILATERALLY AT LUMBAR TWO-FIVE (Bilateral)  SURGEON:  Surgeon(s) and Role:    * Erline Levine, MD - Primary    * Kevan Ny Ditty, MD - Assisting  PHYSICIAN ASSISTANT:   ASSISTANTS: Poteat, RN   ANESTHESIA:   general  EBL:  Total I/O In: 2100 [I.V.:2100] Out: 1035 [Urine:955; Blood:80]  BLOOD ADMINISTERED:none  DRAINS: none   LOCAL MEDICATIONS USED:  MARCAINE    and LIDOCAINE   SPECIMEN:  No Specimen  DISPOSITION OF SPECIMEN:  N/A  COUNTS:  YES  TOURNIQUET:  * No tourniquets in log *  DICTATION: DICTATION: Patient is a 70 year old with 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 It was elected to take her to surgery for anterolateral decompression and posterior pedicle screw fixation.  Procedure: Patient was brought to the operating room and placed in a left lateral decubitus position on the operative table and using orthogonally projected C-arm fluoroscopy the patient was placed so that the L2-3 L3-4 and L4-5 levels were visualized in AP and lateral plane. The patient was then taped into position. The table was flexed so as to expose the L4-5 level as the patient has a high iliac crest. Skin was marked along with a posterior finger dissection incision. His flank was then prepped and draped in usual sterile fashion and incisions were made sequentially at L4-5 L3-4 and L2-3 levels. Posterior finger dissection was made to enter the retroperitoneal space and then subsequently the  probe was inserted into the psoas muscle from the right side initially at the L4-5 level. After mapping the neural elements were able to dock the probe per the midpoint of this vertebral level and without indications electrically of too close proximity to the neural tissues. Subsequently the self-retaining tractor was.after sequential dilators were utilized the shim was employed and the interspace was cleared of psoas muscle and then incised. A thorough discectomy was performed. Angled instruments were used to clear the interspace of disc material. After thorough discectomy was performed and this was performed using AP and lateral fluoroscopy a 10 lordotic by 55 x 18 mm implant was packed with BMP and Attrax. This was tamped into position using the slides and its position was confirmed on AP and lateral fluoroscopy. Subsequently exposure was performed at the L3-4 level and similar dissection was performed with locking of the self-retaining retractor. At this level were able to place a 10 lordotic by 22 x 55 mm implant packed in a similar fashion. At the L2-3 level were able to place an 8 mm standard by 50 x 22 mm implant packed in a similar fashion. He states his was assured the wounds were irrigated interrupted Vicryl sutures.  Patient was then turned into a prone position on the operating table on Reynolds table and using AP and lateral fluoroscopy throughout this portion of the procedure, pedicle screws were placed using Nuvasive Reline cannulated percutaneous screws. 2 screws were placed at L2 and (6.5 x 40 mm) and 2 at L3  (6.5x 40 left and 6.5 x 35 right) and 2 at L4 of a similar size and 2 at  L5 (6.5 x 40 mm). 100 mm rods were then affixed to the screw heads do a separate stab incision and locked down on the screws. All connections were then torqued and the Towers were disassembled. The wounds were irrigated and then closed with 1, 2-0 and 3-0 Vicryl stitches with long-acting Marcaine in the deep  musculature. Sterile occlusive dressing was placed with Dermabond. The patient was then extubated in the operating room and taken to recovery in stable and satisfactory condition having tolerated his operation well. Counts were correct at the end of the case.   PLAN OF CARE: Admit to inpatient   PATIENT DISPOSITION:  PACU - hemodynamically stable.   Delay start of Pharmacological VTE agent (>24hrs) due to surgical blood loss or risk of bleeding: yes

## 2015-12-16 NOTE — Anesthesia Procedure Notes (Signed)
Procedure Name: Intubation Date/Time: 12/16/2015 8:40 AM Performed by: Oletta Lamas Pre-anesthesia Checklist: Patient identified, Emergency Drugs available, Suction available and Patient being monitored Patient Re-evaluated:Patient Re-evaluated prior to inductionOxygen Delivery Method: Circle System Utilized Preoxygenation: Pre-oxygenation with 100% oxygen Intubation Type: IV induction Ventilation: Mask ventilation without difficulty Laryngoscope Size: Mac and 3 Grade View: Grade I Tube type: Oral Number of attempts: 1 Airway Equipment and Method: Stylet Placement Confirmation: ETT inserted through vocal cords under direct vision,  positive ETCO2 and breath sounds checked- equal and bilateral Secured at: 22 cm Tube secured with: Tape Dental Injury: Teeth and Oropharynx as per pre-operative assessment

## 2015-12-16 NOTE — Progress Notes (Signed)
Patient admitted from PACU. Patient alert and oriented x 4. Patient made comfortable at this time.

## 2015-12-17 ENCOUNTER — Encounter (HOSPITAL_COMMUNITY): Payer: Self-pay | Admitting: Neurosurgery

## 2015-12-17 MED ORDER — PANTOPRAZOLE SODIUM 40 MG PO TBEC
40.0000 mg | DELAYED_RELEASE_TABLET | Freq: Every day | ORAL | Status: DC
  Administered 2015-12-17 – 2015-12-18 (×2): 40 mg via ORAL
  Filled 2015-12-17 (×2): qty 1

## 2015-12-17 MED FILL — Sodium Chloride IV Soln 0.9%: INTRAVENOUS | Qty: 1000 | Status: AC

## 2015-12-17 MED FILL — Heparin Sodium (Porcine) Inj 1000 Unit/ML: INTRAMUSCULAR | Qty: 30 | Status: AC

## 2015-12-17 NOTE — Evaluation (Signed)
Occupational Therapy Evaluation Patient Details Name: Sheryl Bender MRN: [id number] DOB: 06-Mar-1945 Today's Date: 12/17/2015    History of Present Illness Adm for RIGHT L2-L5  ANTEROLATERAL LUMBAR INTERBODY FUSION (Right) PERCUTANEOUS PEDICLE SCREWS BILATERALLY AT LUMBAR TWO-FIVE (Bilateral) PMHx-anxiety, depression (voluntarily committed for this 08/2015)   Clinical Impression   Pt was independent in self care and short distance mobility prior to admission. Present with post operative pain, anxiety and generalized weakness interfering with ability to perform ADL and ADL transfers. Pt and family educated in back precautions related to ADL and IADL and multiple uses of 3 in 1. Family verbalizing understanding of all information. Will follow acutely. Family will be providing 24 hour care of pt for several more weeks.    Follow Up Recommendations  No OT follow up;Supervision/Assistance - 24 hour    Equipment Recommendations  3 in 1 bedside comode    Recommendations for Other Services       Precautions / Restrictions Precautions Precautions: Back Precaution Booklet Issued: Yes (comment) Precaution Comments: educated pt and family in back precautions related to ADL Required Braces or Orthoses: Spinal Brace Spinal Brace: Lumbar corset;Applied in sitting position      Mobility Bed Mobility        General bed mobility comments: pt in chair  Transfers Overall transfer level: Needs assistance Equipment used: None Transfers: Sit to/from Stand Sit to Stand: Min assist         General transfer comment: hands-on for safety    Balance                                            ADL Overall ADL's : Needs assistance/impaired Eating/Feeding: Independent;Sitting   Grooming: Wash/dry hands;Standing;Supervision/safety   Upper Body Bathing: Set up;Sitting   Lower Body Bathing: Min guard;Sit to/from stand   Upper Body Dressing : Minimal  assistance;Sitting   Lower Body Dressing: Min guard;Sit to/from stand Lower Body Dressing Details (indicate cue type and reason): able to cross foot over opposite knee Toilet Transfer: Minimal assistance;Ambulation;BSC Toilet Transfer Details (indicate cue type and reason): instructed in use of 3 in 1 over toilet and beside bed at night Toileting- Clothing Manipulation and Hygiene: Sit to/from stand;Min guard Toileting - Clothing Manipulation Details (indicate cue type and reason): instructed to avoid twisting with pericare   Tub/Shower Transfer Details (indicate cue type and reason): instructed in use of 3 in 1 as shower seat   General ADL Comments: Instructed in lifting precautions during IADL.     Vision     Perception     Praxis      Pertinent Vitals/Pain Pain Assessment: Faces Faces Pain Scale: Hurts even more Pain Location: back Pain Descriptors / Indicators: Aching;Grimacing;Guarding Pain Intervention(s): Monitored during session;Repositioned;Premedicated before session     Hand Dominance Right   Extremity/Trunk Assessment Upper Extremity Assessment Upper Extremity Assessment: Overall WFL for tasks assessed   Lower Extremity Assessment Lower Extremity Assessment: Defer to PT evaluation   Cervical / Trunk Assessment Cervical / Trunk Assessment: Normal   Communication Communication Communication: No difficulties   Cognition Arousal/Alertness: Awake/alert Behavior During Therapy: Anxious Overall Cognitive Status: Within Functional Limits for tasks assessed                     General Comments       Exercises       Shoulder Instructions  Home Living Family/patient expects to be discharged to:: Private residence Living Arrangements: Alone;Other relatives Available Help at Discharge: Family;Available 24 hours/day Type of Home: House Home Access: Stairs to enter [employer] of Steps: 1 (threshold) Entrance Stairs-Rails: None Home  Layout: One level     Bathroom Shower/Tub: Corporate investment banker: Standard Bathroom Accessibility: No   Home Equipment: Financial controller: Reacher        Prior Functioning/Environment Level of Independence: Independent                 OT Problem List: Decreased activity tolerance;Impaired balance (sitting and/or standing);Decreased knowledge of use of DME or AE;Pain   OT Treatment/Interventions: Self-care/ADL training;DME and/or AE instruction;Patient/family education    OT Goals(Current goals can be found in the care plan section) Acute Rehab OT Goals Patient Stated Goal: go back to bed OT Goal Formulation: With patient Time For Goal Achievement: 12/24/15 Potential to Achieve Goals: Good ADL Goals Pt Will Perform Grooming: standing;with modified independence Pt Will Perform Lower Body Bathing: with modified independence;sit to/from stand Pt Will Perform Lower Body Dressing: with modified independence;sit to/from stand Pt Will Transfer to Toilet: with modified independence;ambulating;bedside commode (over toilet) Pt Will Perform Toileting - Clothing Manipulation and hygiene: with modified independence;sit to/from stand Pt Will Perform Tub/Shower Transfer: Shower transfer;with supervision;ambulating;3 in 1 Additional ADL Goal #1: Pt will generalize back precautions in ADL and mobility.  OT Frequency: Min 2X/week   Barriers to D/C:            Co-evaluation              End of Session Equipment Utilized During Treatment: Gait belt;Back brace  Activity Tolerance: Patient tolerated treatment well Patient left: in chair;with call bell/phone within reach;with chair alarm set;with family/visitor present   Time: [id number] OT Time Calculation (min): 20 min Charges:  OT General Charges $OT Visit: 1 Procedure OT Evaluation $OT Eval Moderate Complexity: 1 Procedure G-Codes:    Malka So 12/17/2015, 1:50 PM   [phone]

## 2015-12-17 NOTE — Progress Notes (Addendum)
Pt seems confused and anxious family is in room with her. Pt is very apprehensive daughter says this is not her normal demeanor gave pt .25 mg ativan twice seems to help for a short while but, she returns back to a frightened anxious state.

## 2015-12-17 NOTE — Progress Notes (Signed)
Subjective: Patient reports anxious about back pain  Objective: Vital signs in last 24 hours: Temp:  [97.3 F (36.3 C)-98.7 F (37.1 C)] 98.4 F (36.9 C) (11/22 0450) Pulse Rate:  [66-81] 81 (11/22 0450) Resp:  [16-20] 18 (11/22 0450) BP: (124-156)/(56-93) 124/56 (11/22 0450) SpO2:  [96 %-98 %] 97 % (11/22 0450)  Intake/Output from previous day: 11/21 0701 - 11/22 0700 In: 2786.8 [I.V.:2686.8; IV Piggyback:100] Out: 1985 [Urine:1905; Blood:80] Intake/Output this shift: No intake/output data recorded.  Physical Exam: Full strength both legs.  Some discomfort (anticipated) right thigh.  Incisions CDI.  Lab Results: No results for input(s): WBC, HGB, HCT, PLT in the last 72 hours. BMET No results for input(s): NA, K, CL, CO2, GLUCOSE, BUN, CREATININE, CALCIUM in the last 72 hours.  Studies/Results: Dg Lumbar Spine 2-3 Views  Result Date: 12/16/2015 CLINICAL DATA:  Lumbar fusion EXAM: LUMBAR SPINE - 2-3 VIEW; DG C-ARM GT 120 MIN COMPARISON:  None. FINDINGS: Two views of lumbar spine submitted. There is posterior lumbar fusion with transpedicular screws and metallic rods at 123XX123 L4 and L5 level. Postsurgical disc spacer material noted at L2-L3,L3-L4 and L4-L5 level. There is anatomic alignment. Fluoroscopy time 6 minutes 11 seconds. Please see the operative report. IMPRESSION: Posterior lumbar fusion at L2, L3, L4 and L5 level with anatomic alignment. Please see the operative report. Electronically Signed   By: Lahoma Crocker M.D.   On: 12/16/2015 14:15   Dg C-arm Gt 120 Min  Result Date: 12/16/2015 CLINICAL DATA:  Lumbar fusion EXAM: LUMBAR SPINE - 2-3 VIEW; DG C-ARM GT 120 MIN COMPARISON:  None. FINDINGS: Two views of lumbar spine submitted. There is posterior lumbar fusion with transpedicular screws and metallic rods at 123XX123 L4 and L5 level. Postsurgical disc spacer material noted at L2-L3,L3-L4 and L4-L5 level. There is anatomic alignment. Fluoroscopy time 6 minutes 11 seconds.  Please see the operative report. IMPRESSION: Posterior lumbar fusion at L2, L3, L4 and L5 level with anatomic alignment. Please see the operative report. Electronically Signed   By: Lahoma Crocker M.D.   On: 12/16/2015 14:15    Assessment/Plan: Patient is doing well.  At baseline, she has problems with anxiety, rumination and depression.  I reassured her that she is doing extremely well and that she should take pain medication when she needs it, work with PT and mobilize as tolerated.    LOS: 1 day    Sheryl Shoals, MD 12/17/2015, 8:28 AM

## 2015-12-17 NOTE — Evaluation (Signed)
Physical Therapy Evaluation Patient Details Name: Sheryl Bender MRN: [id number] DOB: 11/16/1945 Today's Date: 12/17/2015   History of Present Illness  Adm for RIGHT L2-L5  ANTEROLATERAL LUMBAR INTERBODY FUSION (Right) PERCUTANEOUS PEDICLE SCREWS BILATERALLY AT LUMBAR TWO-FIVE (Bilateral) PMHx-anxiety, depression (voluntarily committed for this 08/2015)  Clinical Impression  Patient is s/p above surgery resulting in the deficits listed below (see PT Problem List). Anxiety on top of pain (and fear of pain) are most limiting factors for pt. Patient will benefit from skilled PT to increase their independence and safety with mobility (while adhering to their precautions) to allow discharge to the venue listed below.     Follow Up Recommendations No PT follow up;Supervision for mobility/OOB    Equipment Recommendations  None recommended by PT    Recommendations for Other Services OT consult     Precautions / Restrictions Precautions Precautions: Back Precaution Booklet Issued: Yes (comment) Precaution Comments: Educated briefly as pt very sleepy; daughter-in-law requested rephrasing (instead of what NOT to do, focus on what she can do) Required Braces or Orthoses: Spinal Brace Spinal Brace: Lumbar corset;Applied in sitting position      Mobility  Bed Mobility Overal bed mobility: Needs Assistance Bed Mobility: Rolling;Sidelying to Sit Rolling: Min assist Sidelying to sit: Mod assist       General bed mobility comments: HOB flat, +rail; vc for technique plus facilitation   Transfers Overall transfer level: Needs assistance Equipment used: None Transfers: Sit to/from Stand Sit to Stand: Min assist         General transfer comment: hands-on for safety  Ambulation/Gait Ambulation/Gait assistance: Min assist Ambulation Distance (Feet): 30 Feet Assistive device: None Gait Pattern/deviations: Step-through pattern;Decreased stride length   Gait velocity interpretation:  Below normal speed for age/gender General Gait Details: only wanted to walk in room due to sleepiness; hands-on full time for safety; no imbalance  Stairs            Wheelchair Mobility    Modified Rankin (Stroke Patients Only)       Balance                                             Pertinent Vitals/Pain Pain Assessment: Faces (did not request # did not want pt to focus on pain) Faces Pain Scale: Hurts even more Pain Location: back Pain Descriptors / Indicators: Operative site guarding Pain Intervention(s): Limited activity within patient's tolerance;Monitored during session;Premedicated before session;Repositioned    Home Living Family/patient expects to be discharged to:: Private residence Living Arrangements: Alone;Other relatives Available Help at Discharge: Family;Available 24 hours/day Type of Home: House (duplex) Home Access: Stairs to enter Entrance Stairs-Rails: None Entrance Stairs-Number of Steps: 1 (threshold) Home Layout: One level        Prior Function Level of Independence: Independent (walking distance of grocery store was difficult/pain)               Hand Dominance        Extremity/Trunk Assessment   Upper Extremity Assessment: Defer to OT evaluation           Lower Extremity Assessment: Overall WFL for tasks assessed      Cervical / Trunk Assessment: Normal  Communication   Communication: Other (comment) (uses child-like voice at times; family cues "strong voice")  Cognition Arousal/Alertness: Lethargic;Suspect due to medications Behavior During Therapy: Flat affect Overall Cognitive Status: Within Functional  Limits for tasks assessed                      General Comments General comments (skin integrity, edema, etc.): Family present and plan to assist pt on discharge    Exercises     Assessment/Plan    PT Assessment Patient needs continued PT services  PT Problem List Decreased activity  tolerance;Decreased mobility;Decreased knowledge of use of DME;Decreased knowledge of precautions;Pain          PT Treatment Interventions DME instruction;Gait training;Functional mobility training;Therapeutic activities;Patient/family education    PT Goals (Current goals can be found in the Care Plan section)  Acute Rehab PT Goals Patient Stated Goal: get back to walking 2 miles  PT Goal Formulation: With patient Time For Goal Achievement: 12/20/15 Potential to Achieve Goals: Good    Frequency Min 5X/week   Barriers to discharge        Co-evaluation               End of Session Equipment Utilized During Treatment: Gait belt;Back brace Activity Tolerance: Patient limited by lethargy Patient left: in chair;with call bell/phone within reach;with chair alarm set;with family/visitor present Nurse Communication: Mobility status         Time: [id number] PT Time Calculation (min) (ACUTE ONLY): 31 min   Charges:   PT Evaluation $PT Eval Low Complexity: 1 Procedure PT Treatments $Therapeutic Activity: 8-22 mins   PT G CodesJeanie Bender Sheryl Bender 2015-12-27, 11:55 AM Pager [phone]

## 2015-12-17 NOTE — Progress Notes (Signed)
RN has helped patient to use the bathroom at the said time, RN was waiting at the bathroom door to help patient back to bed when she had finished. While waiting, patient trip on her sock and her leg gave up so was seen on her her knees. She was then helped back to bed... No injury was sustained and patient denied any pains. Will keep monitoring.

## 2015-12-18 MED ORDER — GABAPENTIN 300 MG PO CAPS
300.0000 mg | ORAL_CAPSULE | Freq: Two times a day (BID) | ORAL | Status: DC
  Administered 2015-12-18 – 2015-12-19 (×3): 300 mg via ORAL
  Filled 2015-12-18 (×3): qty 1

## 2015-12-18 NOTE — Progress Notes (Signed)
Physical Therapy Treatment Patient Details Name: Sheryl Bender MRN: [id number] DOB: 01/25/1946 Today's Date: 12/18/2015    History of Present Illness Adm for RIGHT L2-L5  ANTEROLATERAL LUMBAR INTERBODY FUSION (Right) PERCUTANEOUS PEDICLE SCREWS BILATERALLY AT LUMBAR TWO-FIVE (Bilateral) PMHx-anxiety, depression (voluntarily committed for this 08/2015)    PT Comments    Pt/family had a lot of questions in regards to spinal precautions.  Pt tolerated tx well and PTA re-educated on precautions to ensure safety at home.  Pt appears less anxious during tx.    Follow Up Recommendations  No PT follow up;Supervision for mobility/OOB     Equipment Recommendations  None recommended by PT    Recommendations for Other Services       Precautions / Restrictions Precautions Precautions: Back Precaution Booklet Issued: Yes (comment) Precaution Comments: educated pt and family in back precautions related to ADL Required Braces or Orthoses: Spinal Brace Spinal Brace: Lumbar corset;Applied in sitting position    Mobility  Bed Mobility Overal bed mobility: Needs Assistance Bed Mobility: Rolling;Sidelying to Sit;Sit to Supine Rolling: Min assist Sidelying to sit: Min assist   Sit to supine: Min assist   General bed mobility comments: Pt required cues for log rolling, and assist with trunk during sidelying to sit, and sit to side lying.  Pt has difficulty maintaining precautions for back to bed.    Transfers Overall transfer level: Needs assistance Equipment used: None Transfers: Sit to/from Stand Sit to Stand: Min assist         General transfer comment: Cues for hand placement to and from seated surface.  Pt requires max cues to control eccentric loading.    Ambulation/Gait Ambulation/Gait assistance: Min guard Ambulation Distance (Feet): 200 Feet Assistive device: Rolling walker (2 wheeled) Gait Pattern/deviations: Step-through pattern;Decreased stride length;Trunk flexed    Gait velocity interpretation: Below normal speed for age/gender General Gait Details: Cues for forward gaze and RW safety in position.  Pt with increased gait speed and good tolerance to activity.     Stairs            Wheelchair Mobility    Modified Rankin (Stroke Patients Only)       Balance Overall balance assessment: Needs assistance   Sitting balance-Leahy Scale: Good       Standing balance-Leahy Scale: Fair                      Cognition Arousal/Alertness: Awake/alert Behavior During Therapy: Anxious Overall Cognitive Status: Within Functional Limits for tasks assessed                      Exercises      General Comments        Pertinent Vitals/Pain Pain Assessment: 0-10 Pain Score: 2  Pain Location: R thigh Pain Descriptors / Indicators: Aching;Guarding;Grimacing Pain Intervention(s): Monitored during session;Repositioned    Home Living                      Prior Function            PT Goals (current goals can now be found in the care plan section) Acute Rehab PT Goals Patient Stated Goal: go back to bed Potential to Achieve Goals: Good Additional Goals Additional Goal #1: Patient will demonstrate correct techniques to maintain back precautions Progress towards PT goals: Progressing toward goals    Frequency    Min 5X/week      PT Plan Current plan remains appropriate  Co-evaluation             End of Session Equipment Utilized During Treatment: Gait belt;Back brace Activity Tolerance: Patient tolerated treatment well Patient left: in bed;with call bell/phone within reach;with family/visitor present     Time: [id number] PT Time Calculation (min) (ACUTE ONLY): 21 min  Charges:  $Gait Training: 8-22 mins                    G Codes:      Cristela Blue 01-17-2016, 8:08 AM  Governor Rooks, PTA pager [phone]

## 2015-12-18 NOTE — Progress Notes (Signed)
Pt report very painful burning sensation that starts from her RLQ abd and spreads to her right thigh. Dr. Saintclair Halsted paged and notified; new order received for gabapentin 300mg  BID. Delia Heady RN

## 2015-12-18 NOTE — Progress Notes (Signed)
Patient ID: Sheryl Bender, female   DOB: Nov 05, 1945, 70 y.o.   MRN: [id number] Subjective: Patient reports some soreness in the side and upper leg. Pain not that bad. has not slept much.  Objective: Vital signs in last 24 hours: Temp:  [98.2 F (36.8 C)-99.7 F (37.6 C)] 99.7 F (37.6 C) (11/23 0045) Pulse Rate:  [73-85] 73 (11/23 0045) Resp:  [16-18] 16 (11/23 0045) BP: (100-126)/(47-58) 117/50 (11/23 0045) SpO2:  [95 %-98 %] 96 % (11/23 0045)  Intake/Output from previous day: No intake/output data recorded. Intake/Output this shift: No intake/output data recorded.  Neurologic: Grossly normal  Lab Results: Lab Results  Component Value Date   WBC 5.3 12/08/2015   HGB 14.3 12/08/2015   HCT 42.9 12/08/2015   MCV 92.1 12/08/2015   PLT 212 12/08/2015   No results found for: INR, PROTIME BMET Lab Results  Component Value Date   NA 140 12/08/2015   K 4.0 12/08/2015   CL 106 12/08/2015   CO2 27 12/08/2015   GLUCOSE 92 12/08/2015   BUN 21 (H) 12/08/2015   CREATININE 0.72 12/08/2015   CALCIUM 9.0 12/08/2015    Studies/Results: Dg Lumbar Spine 2-3 Views  Result Date: 12/16/2015 CLINICAL DATA:  Lumbar fusion EXAM: LUMBAR SPINE - 2-3 VIEW; DG C-ARM GT 120 MIN COMPARISON:  None. FINDINGS: Two views of lumbar spine submitted. There is posterior lumbar fusion with transpedicular screws and metallic rods at 123XX123 L4 and L5 level. Postsurgical disc spacer material noted at L2-L3,L3-L4 and L4-L5 level. There is anatomic alignment. Fluoroscopy time 6 minutes 11 seconds. Please see the operative report. IMPRESSION: Posterior lumbar fusion at L2, L3, L4 and L5 level with anatomic alignment. Please see the operative report. Electronically Signed   By: Lahoma Crocker M.D.   On: 12/16/2015 14:15   Dg C-arm Gt 120 Min  Result Date: 12/16/2015 CLINICAL DATA:  Lumbar fusion EXAM: LUMBAR SPINE - 2-3 VIEW; DG C-ARM GT 120 MIN COMPARISON:  None. FINDINGS: Two views of lumbar spine submitted. There  is posterior lumbar fusion with transpedicular screws and metallic rods at 123XX123 L4 and L5 level. Postsurgical disc spacer material noted at L2-L3,L3-L4 and L4-L5 level. There is anatomic alignment. Fluoroscopy time 6 minutes 11 seconds. Please see the operative report. IMPRESSION: Posterior lumbar fusion at L2, L3, L4 and L5 level with anatomic alignment. Please see the operative report. Electronically Signed   By: Lahoma Crocker M.D.   On: 12/16/2015 14:15    Assessment/Plan: Making the expected recovery. Continue to mobilize   LOS: 2 days    Uilani Sanville S 12/18/2015, 5:12 AM

## 2015-12-19 MED ORDER — HYDROCODONE-ACETAMINOPHEN 5-325 MG PO TABS: 1.0000 | ORAL_TABLET | ORAL | 0 refills | Status: DC | PRN

## 2015-12-19 NOTE — Progress Notes (Signed)
Patient ID: Sheryl Bender, female   DOB: 05-04-1945, 70 y.o.   MRN: [id number] Doing well  Pain controlled  Neuro stable strength out of 5  Mobilized with physical therapy and discharge home

## 2015-12-19 NOTE — Progress Notes (Signed)
Occupational Therapy Treatment Patient Details Name: Sheryl Bender MRN: [id number] DOB: Jun 17, 1945 Today's Date: 12/19/2015    History of present illness Adm for RIGHT L2-L5  ANTEROLATERAL LUMBAR INTERBODY FUSION (Right) PERCUTANEOUS PEDICLE SCREWS BILATERALLY AT LUMBAR TWO-FIVE (Bilateral) PMHx-anxiety, depression (voluntarily committed for this 08/2015)   OT comments  This 70 yo female admitted and underwent above seen today for tub/shower transfer education and LBD education. Pt will have 24/7 S/prn A initially. She will continue to benefit from [facility] to get independent to Mod I in her home. We will D/C due to pt to D/C today. Have asked nurse to get a HHOT/PT order from neuro MD and I have made CM aware as well of our change in recommendations.  Follow Up Recommendations  Home health OT;Supervision/Assistance - 24 hour    Equipment Recommendations  3 in 1 bedside comode       Precautions / Restrictions Precautions Precautions: Back Precaution Comments: Reinforced proper ways to move to avoid bending and twisting Required Braces or Orthoses: Spinal Brace Spinal Brace: Lumbar corset;Applied in sitting position (pt doffed) Restrictions Weight Bearing Restrictions: No       Mobility Bed Mobility Overal bed mobility: Needs Assistance     Sit to sidelying: Min guard General bed mobility comments: HOB and no rail with cues to keep both arms in front of her as she came down on her side  Transfers Overall transfer level: Needs assistance Equipment used: None Transfers: Sit to/from Stand Sit to Stand: Supervision         General transfer comment: for safety and assure pt (anxiety)    Balance Overall balance assessment: Needs assistance Sitting-balance support: No upper extremity supported;Feet supported Sitting balance-Leahy Scale: Good Sitting balance - Comments: back straight while using UEs to cross legs over to don socks   Standing balance support: No upper extremity  supported Standing balance-Leahy Scale: Good                     ADL Overall ADL's : Needs assistance/impaired                       Lower Body Dressing Details (indicate cue type and reason): Pt can cross one leg over other at knee/ankle with A of arms and pants legs for LBD         Tub/ Shower Transfer:  (side stepping) Tub/Shower Transfer Details (indicate cue type and reason): son and dtr-in-law also present                    Cognition   Behavior During Therapy: Anxious Overall Cognitive Status: Within Functional Limits for tasks assessed       Memory: Decreased short-term memory                            Pertinent Vitals/ Pain       Pain Assessment: Faces Faces Pain Scale: Hurts little more Pain Location: back Pain Descriptors / Indicators: Sore Pain Intervention(s): Limited activity within patient's tolerance;Monitored during session;Repositioned         Frequency  Min 2X/week        Progress Toward Goals  OT Goals(current goals can now be found in the care plan section)  Progress towards OT goals:  (acute OT education completed and now recommending HHOT)  Acute Rehab OT Goals Patient Stated Goal: get back to walking 2 miles   Plan Discharge  plan needs to be updated       End of Session Equipment Utilized During Treatment: Gait belt;Back brace   Activity Tolerance Patient tolerated treatment well   Patient Left in bed;with call bell/phone within reach;with family/visitor present   Nurse Communication          Time: [id number] OT Time Calculation (min): 20 min  Charges: OT General Charges $OT Visit: 1 Procedure OT Treatments $Self Care/Home Management : 8-22 mins  Almon Register 12/19/2015, 11:41 AM

## 2015-12-19 NOTE — Clinical Social Work Note (Signed)
CSW consulted for New SNF. PT is not recommending any follow up. RNCM is aware. CSW is signing off as no further needs identified.   Darden Dates, MSW, LCSW Clinical Social Worker [phone]

## 2015-12-19 NOTE — Discharge Instructions (Signed)
No lifting no bending no twisting no driving a riding a car unless he is calm back and forth to see Dr. Vertell Limber.

## 2015-12-19 NOTE — Progress Notes (Signed)
Physical Therapy Treatment Patient Details Name: Sheryl Bender MRN: [id number] DOB: 30-May-1945 Today's Date: 12/19/2015    History of Present Illness Adm for RIGHT L2-L5  ANTEROLATERAL LUMBAR INTERBODY FUSION (Right) PERCUTANEOUS PEDICLE SCREWS BILATERALLY AT LUMBAR TWO-FIVE (Bilateral) PMHx-anxiety, depression (voluntarily committed for this 08/2015)    PT Comments    Patient remains anxious re: all aspects of her mobility. She required min cues for maintaining back precautions with bed mobility. She will have family with her for a couple of weeks. Now feel HHPT will be important to continue education and complete home safety evaluation (i.e. Reassure pt she is using an appropriate chair, etc).    Follow Up Recommendations  Supervision for mobility/OOB;Home health PT     Equipment Recommendations  None recommended by PT    Recommendations for Other Services       Precautions / Restrictions Precautions Precautions: Back Precaution Comments: Reinforced proper ways to move to avoid bending and twisting Required Braces or Orthoses: Spinal Brace Spinal Brace: Lumbar corset;Applied in sitting position (pt donned)    Mobility  Bed Mobility Overal bed mobility: Needs Assistance Bed Mobility: Rolling;Sidelying to Sit Rolling: Modified independent (Device/Increase time) Sidelying to sit: Min assist       General bed mobility comments: HOB flat, used edge of mattress; vc for technique plus facilitation   Transfers Overall transfer level: Needs assistance Equipment used: None Transfers: Sit to/from Stand Sit to Stand: Supervision         General transfer comment: for safety and assure pt (anxiety)  Ambulation/Gait Ambulation/Gait assistance: Min assist Ambulation Distance (Feet): 200 Feet Assistive device: None Gait Pattern/deviations: Step-through pattern;Decreased stride length;Drifts right/left;Wide base of support Gait velocity: decr, encouraged to incr velocity for  incr stability Gait velocity interpretation: Below normal speed for age/gender General Gait Details: initially more guarded, however as she progressed, became more fluid; slight drift left with head turn during conversation   Stairs            Wheelchair Mobility    Modified Rankin (Stroke Patients Only)       Balance Overall balance assessment: Needs assistance Sitting-balance support: No upper extremity supported;Feet supported Sitting balance-Leahy Scale: Good Sitting balance - Comments: back straight while using UEs to cross legs over to don socks   Standing balance support: No upper extremity supported Standing balance-Leahy Scale: Good                      Cognition Arousal/Alertness: Awake/alert Behavior During Therapy: Anxious Overall Cognitive Status: Within Functional Limits for tasks assessed       Memory: Decreased short-term memory              Exercises      General Comments General comments (skin integrity, edema, etc.): son and dtr-in-law present      Pertinent Vitals/Pain Pain Assessment: Faces Faces Pain Scale: Hurts even more Pain Location: back Pain Descriptors / Indicators:  (pulling) Pain Intervention(s): Limited activity within patient's tolerance;Monitored during session;Premedicated before session;Repositioned    Home Living                      Prior Function            PT Goals (current goals can now be found in the care plan section) Acute Rehab PT Goals Patient Stated Goal: get back to walking 2 miles  Time For Goal Achievement: 12/20/15 Progress towards PT goals: Progressing toward goals    Frequency  Min 5X/week      PT Plan Discharge plan needs to be updated    Co-evaluation             End of Session Equipment Utilized During Treatment: Gait belt;Back brace Activity Tolerance: Patient tolerated treatment well Patient left: with family/visitor present (in gym with OT for shower  training)     Time: [id number] PT Time Calculation (min) (ACUTE ONLY): 18 min  Charges:  $Gait Training: 8-22 mins                    G CodesJeanie Cooks Gleason Ardoin January 18, 2016, 11:29 AM Pager [phone]

## 2015-12-19 NOTE — Discharge Summary (Signed)
  Physician Discharge Summary  Patient ID: Sheryl Bender MRN: [id number] DOB/AGE: Jun 29, 1945 70 y.o.  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 Postoperatively patient did fairly well with recovered in the floor on the floor was angling and voiding spontaneously tolerating her diet stable for discharge home.  Consults: Significant Diagnostic Studies: Treatments: X LIF L2-3, L3-4, L4-L5 Discharge Exam: Blood pressure 127/61, pulse 75, temperature 98.1 F (36.7 C), temperature source Oral, resp. rate 20, height 5\' 2"  (1.575 m), weight 66.5 kg (146 lb 11.2 oz), SpO2 95 %. Strength out of 5 wound clean dry and intact home  Disposition: Home  Discharge Instructions    Elevated toilet seat    Complete by:  As directed        Medication List    TAKE these medications   acetaminophen 500 MG tablet Commonly known as:  TYLENOL Take 1,000 mg by mouth every 6 (six) hours as needed for mild pain.   escitalopram 20 MG tablet Commonly known as:  LEXAPRO Take 20 mg by mouth daily.   fluvoxaMINE 100 MG tablet Commonly known as:  LUVOX Take 2 tablets by mouth daily.   HYDROcodone-acetaminophen 5-325 MG tablet Commonly known as:  NORCO/VICODIN Take 1-2 tablets by mouth every 4 (four) hours as needed (mild pain).   loperamide 2 MG capsule Commonly known as:  IMODIUM Take 2 mg by mouth as needed for diarrhea or loose stools.   LORazepam 0.5 MG tablet Commonly known as:  ATIVAN Take 0.5 tablets by mouth 4 (four) times daily as needed for anxiety.   metoprolol succinate 25 MG 24 hr tablet Commonly known as:  TOPROL-XL Take 25 mg by mouth daily.   naproxen sodium 220 MG  tablet Commonly known as:  ANAPROX Take 440 mg by mouth 2 (two) times daily with a meal.   QUEtiapine 25 MG tablet Commonly known as:  SEROQUEL TAKE 1/2 (HALF) TABLET BY MOUTH TWICE A DAY AND TAKE 1 TABLET AT BEDTIME      Follow-up Information    STERN,JOSEPH D, MD Follow up in 2 day(s).   Specialty:  Neurosurgery Contact information: [address] [place] [address] [phone]        Peggyann Shoals, MD .   Specialty:  Neurosurgery Contact information: [address] [place] [address] [phone]           Signed: Deasia Chiu P 12/19/2015, 10:03 AM

## 2015-12-19 NOTE — Care Management Note (Addendum)
Case Management Note  Patient Details  Name: Paisleigh Kubena MRN: [id number] Date of Birth: Aug 14, 1945  Subjective/Objective:                    Action/Plan: Pt discharging home with self care. Pt with orders for 3 in 1. Larene Beach with [facility] DME notified and will deliver the equipment to the room. Pt states she has family support at home.  Addendum (1530): PT now recommending [facility] services. MD placed orders. Pt given choice of [facility] agencies in [place], [place]. He son Information systems manager. CM spoke with [facility] and they accepted the referral. Information they requested was faxed.   Expected Discharge Date:                  Expected Discharge Plan:  Home/Self Care  In-House Referral:     Discharge planning Services  CM Consult  Post Acute Care Choice:  Durable Medical Equipment Choice offered to:  Patient, Adult Children  DME Arranged:  3-N-1 DME Agency:  [place]:    [place] Agency:     Status of Service:  Completed, signed off  If discussed at [place] of Stay Meetings, dates discussed:    Additional Comments:  Pollie Friar, RN 12/19/2015, 10:58 AM

## 2020-09-24 ENCOUNTER — Encounter: Payer: Self-pay | Admitting: Internal Medicine

## 2021-01-21 ENCOUNTER — Encounter: Payer: Self-pay | Admitting: Internal Medicine

## 2021-01-21 ENCOUNTER — Encounter: Payer: Self-pay | Admitting: Gastroenterology

## 2021-01-28 ENCOUNTER — Other Ambulatory Visit: Payer: Self-pay

## 2021-01-28 ENCOUNTER — Ambulatory Visit: Payer: Medicare Other | Admitting: Gastroenterology

## 2021-01-28 ENCOUNTER — Telehealth: Payer: Self-pay | Admitting: Gastroenterology

## 2021-01-28 ENCOUNTER — Encounter: Payer: Self-pay | Admitting: Gastroenterology

## 2021-01-28 VITALS — BP 128/75 | HR 75 | Temp 97.1°F | Ht 62.5 in | Wt 133.0 lb

## 2021-01-28 DIAGNOSIS — Z1211 Encounter for screening for malignant neoplasm of colon: Secondary | ICD-10-CM | POA: Diagnosis not present

## 2021-01-28 DIAGNOSIS — K8689 Other specified diseases of pancreas: Secondary | ICD-10-CM

## 2021-01-28 NOTE — Addendum Note (Signed)
Addended by: Hassan Rowan on: 01/28/2021 03:25 PM   Modules accepted: Orders

## 2021-01-28 NOTE — Progress Notes (Signed)
Referring Provider: Sherrilee Gilles, DO Primary Care Physician:  Sherrilee Gilles, DO Primary Gastroenterologist:  Dr. Gala Romney  Chief Complaint  Patient presents with   Consult    Last TCS 10 years ago in [place]    HPI:   Sheryl Bender is a 76 y.o. female presenting today at the request of Sherrilee Gilles, DO for consult colonoscopy, office visit due to age.  Reviewed referral information.  Office visit with PCP in August 2022 for routine medical wellness visit.  Her chronic medical conditions were stable.  She was referred to GI for screening colonoscopy.  She received pneumonia vaccine.    Blood work included in referral completed 08/29/2020 with no significant abnormalities on CBC or CMP.  Today:  Patient reports her last colonoscopy was about 10 years ago in [place]. Normal exam. Brother had a polyp. No Fhx of colon cancer.  States she is not having any lower GI issues.  She denies constipation, diarrhea, BRBPR, melena.  She wants to hold off on colonoscopy for now.    Her primary concern today is a pancreatic abnormality.  She reports being diagnosed with a blood clot in her lung a couple of months ago of unknown etiology.  States she was hospitalized at [facility] and had a CT scan that showed something abnormal with her pancreas.  States she was referred to Dr. Nancy Fetter, hematologist in [place], who told her she needed to see a GI doctor for her pancreas.  She is currently on Eliquis for the blood clot.  Denies ever having any shortness of breath or chest pain.  States the leg symptoms that she had was sudden onset pain when she took a deep breath.  This has resolved.  She is not sure how long she will be on Eliquis.  She denies any history of problems with her pancreas in the past.  No history of pancreatitis.  No family history of pancreatic cancer.  Her brother did have pancreatitis secondary to alcohol. Denies ever having any abdominal pain even at the time of her CT.  Reports chronic  history of intermittent morning dry heaves.  This has been present for years and she associates this with her anxiety.  Denies nausea or vomiting.  Reports she had gained up to 155 pounds over a year ago.  She did not like this as her close were fitting tight.  She started decreasing portion sizes and limiting sweets.  She lost down to 145 pounds which was her usual baseline.  States she continued to lose some weight thereafter.  She was 133 pounds today, but is unable to tell me if her weight loss is ongoing or has been stable in the 130s. Per review of referral information, patient weighed 130 pounds in August at the time of her office visit with primary care.  States she does not have any problem eating and could eat more if she wanted to, but she does not want to gain weight.  She eats a peanut butter sandwich for breakfast, leftovers for lunch, frozen meals for dinner.  Denies reflux or dysphagia.  Denies alcohol use. No NSAIDs.    Initially, I did not have patient CT report for review; however, after further review, and receptionist was able to locate her CT report which was also attached to an office visit with Dr. Nancy Fetter on 01/01/2021.  Per his note, patient was diagnosed with pulmonary embolism in September.  She also was found to have an elevated lipase of over 3000  and a CT scan revealing pancreatic duct dilation with irregular opacities.  He had ordered a repeat CT scan which was completed on 12/17/2020 and again revealed fatty atrophy of the pancreas, pancreatic duct dilation from the level of the pancreatic neck to the tail, duct within the pancreatic head not well visualized.  He recommended she see a gastroenterologist.  Patient reports she did have blood work updated with her primary care provider after her hospitalization.  Past Medical History:  Diagnosis Date   Anxiety    Arthritis    Depression    Dysrhythmia    hx palpitations greater than 5 yrs -neg echo, stress per pt ? where or  dr   GERD (gastroesophageal reflux disease)    occ   Pulmonary embolism ([place])    September 2022    Past Surgical History:  Procedure Laterality Date   ANTERIOR LAT LUMBAR FUSION Right 12/16/2015   Procedure: RIGHT LUMBAR TWO-THREE, LUMBAR THREE-FOUR, LUMBAR FOUR-FIVE ANTEROLATERAL LUMBAR INTERBODY FUSION;  Surgeon: Erline Levine, MD;  Location: [facility];  Service: Neurosurgery;  Laterality: Right;   LUMBAR PERCUTANEOUS PEDICLE SCREW 3 LEVEL Bilateral 12/16/2015   Procedure: PERCUTANEOUS PEDICLE SCREWS BILATERALLY AT LUMBAR TWO-FIVE;  Surgeon: Erline Levine, MD;  Location: [facility];  Service: Neurosurgery;  Laterality: Bilateral;   TUBAL LIGATION  1974    Current Outpatient Medications  Medication Sig Dispense Refill   busPIRone (BUSPAR) 15 MG tablet 69m daily     clonazePAM (KLONOPIN) 0.5 MG tablet Take 0.75 mg by mouth daily.     ELIQUIS 5 MG TABS tablet Take 5 mg by mouth 2 (two) times daily.     Metoprolol Succinate 25 MG CS24 Take by mouth daily.     TRINTELLIX 20 MG TABS tablet Take 20 mg by mouth daily.     No current facility-administered medications for this visit.    Allergies as of 01/28/2021 - Review Complete 01/28/2021  Allergen Reaction Noted   Other Hives 12/08/2015    Family History  Problem Relation Age of Onset   Pancreatitis Brother        alcoholic pancreatitis   Pancreatic cancer Neg Hx    Colon cancer Neg Hx     Social History   Socioeconomic History   Marital status: Widowed    Spouse name: Not on file   Number of children: Not on file   Years of education: Not on file   Highest education level: Not on file  Occupational History   Not on file  Tobacco Use   Smoking status: Never   Smokeless tobacco: Never  Substance and Sexual Activity   Alcohol use: No   Drug use: Never   Sexual activity: Not on file  Other Topics Concern   Not on file  Social History Narrative   Not on file   Social Determinants of Health   Financial Resource Strain:  Not on file  Food Insecurity: Not on file  Transportation Needs: Not on file  Physical Activity: Not on file  Stress: Not on file  Social Connections: Not on file  Intimate Partner Violence: Not on file    Review of Systems: Gen: Denies any fever, chills, cold or flulike symptoms, presyncope, syncope. CV: Denies chest pain, heart palpitations. Resp: Denies shortness of breath or cough. GI: See HPI GU : Denies urinary burning, urinary frequency, urinary hesitancy MS: Denies joint pain. Derm: Denies rash. Psych: Denies depression, anxiety. Heme: See HPI  Physical Exam: BP 128/75    Pulse 75  Temp (!) 97.1 F (36.2 C)    Ht 5' 2.5" (1.588 m)    Wt 133 lb (60.3 kg)    BMI 23.94 kg/m  General:   Alert and oriented. Pleasant and cooperative. Well-nourished and well-developed.  Head:  Normocephalic and atraumatic. Eyes:  Without icterus, sclera clear and conjunctiva pink.  Ears:  Normal auditory acuity. Lungs:  Clear to auscultation bilaterally. No wheezes, rales, or rhonchi. No distress.  Heart:  S1, S2 present without murmurs appreciated.  Abdomen:  +BS, soft, non-tender and non-distended. No HSM noted. No guarding or rebound. No masses appreciated.  Rectal:  Deferred  Msk:  Symmetrical without gross deformities. Normal posture. Pulses:  Normal pulses noted. Extremities:  Without edema. Neurologic:  Alert and  oriented x4;  grossly normal neurologically. Skin:  Intact without significant lesions or rashes. Psych: Normal mood and affect.   Labs:  08/29/2020 CBC: WBC 4.9, hemoglobin 14.9, hematocrit 45.1, MCV 94, MCH 31, MCHC 33, platelets 290. CMP: Glucose 90, BUN 18, creatinine 0.8, calcium 9.7, sodium 139, potassium 4.2, albumin 4.0, total bilirubin 0.7, alk phos 106, AST 25, ALT 16 Lipid panel: Total cholesterol 220 (H), HDL 85, LDL 118 (H), to lesser is 100.  Imaging: CT A/P with contrast 12/17/2020 Findings: Subcentimeter hypodensities in the liver are too small to  characterize but are likely cysts.  Gallbladder is within normal limits.  Spleen is normal in size and CT density.  Fatty atrophy of the pancreas.  Pancreatic duct is dilated from the level of the pancreatic neck to the tail.  The duct within the pancreatic head is not well visualized.  Kidneys are normal in size.  There is no hydronephrosis.  Bilateral renal hypodensities are consistent with cortical and parapelvic cysts.  Adrenal glands are unremarkable.  Scattered calcified atherosclerotic plaque throughout the aorta and branch vessels.  No significant abdominopelvic adenopathy is identified.  No evidence of bowel obstruction.  No free air or free fluid.  No appendicitis.  Minimal colonic diverticulosis without acute inflammation.  Visualized osseous structures are intact.  Posterior spinal fixation hardware extends from L2-L5.  The bladder distended normally.  The uterus and ovaries appear within... (the rest of the report was cut off).    Assessment:  76 year old female with history of anxiety, depression, recent pulmonary embolism in September 2022 on Eliquis, presenting today at the request of Sherrilee Gilles, DO for consult colonoscopy.  We also received referral information from Dr. Pandora Leiter, patient's hematologist regarding pancreatic duct dilation.  Colon cancer screening: Patient reports last colonoscopy was about 10 years ago in [place] with normal exam.  No family history of colon cancer, but brother has history of colon polyp.  She has no significant lower GI symptoms and prefers to hold off on colonoscopy for now until we complete evaluation of her pancreas.  Pancreatic duct dilation: Received CT A/P with contrast report dated 12/17/2020 at [facility] imaging center revealing fatty atrophy of the pancreas, pancreatic duct dilation from pancreatic neck to the tail, duct within the pancreatic head not well visualized, subcentimeter hypodensities in the liver too small to  characterize, likely cyst.  Per review of Dr. Mindi Curling ([place]) note in December, patient was admitted in September 2022 with pulmonary embolism, also found to have elevated lipase over 3000 with CT scan revealing pancreatic duct dilation with irregular opacities.  I did not receive these lab results or CT that was completed during hospitalization.  Prior CT A/P with contrast on file from May 2021 with  unremarkable pancreas.   Patient denies ever having any abdominal pain.  She has chronic history of intermittent morning dry heaving which she relates to her anxiety, no other nausea or vomiting.  She has had some weight loss over the last year or so which was intentional initially through dietary restriction, but reports weight continued to decline; however, appears her weight has been stable at least since August 2022.  She continues to be very careful with her dietary intake as she does not want to gain weight.  She denies any known history of pancreatic issues, history of pancreatitis, family history of pancreatic cancer.  Brother with history of alcoholic pancreatitis.  She denies history of alcohol use.  LFTs prior to admission in August were normal. No recent blood work received, but patient reports having labs updated with her after hospitalization. Exam today with no evidence of jaundice/scleral icterus and abdominal exam was benign.  Etiology of pancreatic duct dilation is unknown. We will plan for MRI/MRCP with pancreatic protocol for further evaluation.   Plan:  MRI/MRCP with pancreatic protocol.  Verified with MRI tech that patient's history of screws and rods in her lumbar spine are compatible with MRI. Request prior CT scan and labs that were completed during admission in September from [facility].  Request most recent labs from PCP.  We will hold off on colonoscopy for now per patient's request. This is also ideal with recent PE. Would like to wait 6 months following PE  (March).  Further recommendations to follow imaging and review of records.   Aliene Altes, PA-C [facility] 01/28/2021

## 2021-01-28 NOTE — Telephone Encounter (Signed)
[facility] does not do MRCP. Tried to call pt, LMOVM for return call.

## 2021-01-28 NOTE — Patient Instructions (Signed)
We will work on getting you scheduled for an MRI to further evaluate pancreatic duct dilation.  I am going to reach out to the radiologist to verify that we are okay to proceed with this imaging study as you have screws and rods in your back.  We will hold off on scheduling you for colonoscopy for now as you have requested.  Further recommendations to follow additional imaging.  It was a pleasure meeting you today!   Aliene Altes, PA-C [facility]

## 2021-01-28 NOTE — Telephone Encounter (Signed)
Discuss her prior back surgeries with hardware implants with MRI tech who states this is compatible with MRI.  No contraindications.  [facility] clinical pool: Please arrange MRI/MRCP with pancreatic protocol. Would like for this to be completed in the next couple of weeks.  Dx: Abnormal CT pancreas; pancreatic duct dilation *Patient is requesting to have MRI completed in [place] if she can.   Stacey:  Can we request CT and labs from [facility] that were completed during admission in September 2022? Can we also request recent labs from PCP?

## 2021-01-28 NOTE — Addendum Note (Signed)
Addended by: Zara Council C on: 01/28/2021 03:08 PM   Modules accepted: Orders

## 2021-01-28 NOTE — Telephone Encounter (Signed)
Spoke to pt, informed her MRI/MRCP isn't done in [place]. She is ok with having MRI/MRCP at [facility].  MRI/MRCP scheduled for 02/06/21 at 2:00pm. NPO 4 hours prior to test.   Called pt, informed her of MRI appt. Letter mailed.

## 2021-01-28 NOTE — Telephone Encounter (Signed)
PA for MRI submitted via AIM website. Case "adheres". Medicare decision support#: [id number] [place].

## 2021-02-02 ENCOUNTER — Telehealth: Payer: Self-pay | Admitting: Gastroenterology

## 2021-02-02 NOTE — Telephone Encounter (Signed)
Received and reviewed hospital records from [facility].  Patient was admitted 10/08/2020 - 10/09/2020 with acute unprovoked right large PE with no right heart strain but mild pulmonary hypertension.  She was treated with heparin which was transitioned to Eliquis.  Also found to have elevated lipase of 5767 on admission.  She was asymptomatic to this and lipase trended down to 3905 by the time of discharge.  LFTs wnl. CT A/P without contrast with irregular pancreatic duct measuring up to 8.3 mm, duct within the pancreatic head not visualized.  4.3 mm and 4.6 mm lesions too small to characterize within the right lobe of the liver which could represent a small cyst or hemangioma.  Gallbladder and bile ducts within normal limits.   Received and reviewed most recent labs completed with PCP dated 12/09/2020. CMP: Sodium 141, potassium 4.6, chloride 107, calcium 9.5, glucose 91, BUN 19, creatinine 0.7, total protein 6.6, albumin 3.9, total bilirubin 0.5, alk phos 98, AST 20, ALT 17. Cholesterol panel: Total cholesterol 172, HDL 88, triglycerides 61, LDL 74.  Courtney:  Please let patient know I have reviewed her records from [facility] along with her most recent labs from primary care.  Recommend we proceed with MRI as scheduled.  I would like to update her lipase at the time of her MRI as her lipase was still quite elevated when she was discharged from the hospital.    Please arrange lipase.  DX: Elevated lipase, abnormal CT pancreas

## 2021-02-03 ENCOUNTER — Telehealth: Payer: Self-pay | Admitting: Internal Medicine

## 2021-02-03 ENCOUNTER — Other Ambulatory Visit: Payer: Self-pay | Admitting: *Deleted

## 2021-02-03 DIAGNOSIS — R748 Abnormal levels of other serum enzymes: Secondary | ICD-10-CM

## 2021-02-03 DIAGNOSIS — R9389 Abnormal findings on diagnostic imaging of other specified body structures: Secondary | ICD-10-CM

## 2021-02-03 NOTE — Telephone Encounter (Signed)
See previous note

## 2021-02-03 NOTE — Telephone Encounter (Signed)
Spoke to pt informed her of recommendations. Pt voiced understanding. Informed her to have labs drawn after the MRI. Labs entered into Epic.

## 2021-02-03 NOTE — Telephone Encounter (Signed)
Noted  

## 2021-02-03 NOTE — Telephone Encounter (Signed)
Pt said she received a VM and was returning call. Not sure who called. Please call [phone]

## 2021-02-06 ENCOUNTER — Other Ambulatory Visit (HOSPITAL_COMMUNITY)
Admission: RE | Admit: 2021-02-06 | Discharge: 2021-02-06 | Disposition: A | Discharge status: Home / Self Care | Payer: Medicare Other | Source: Ambulatory Visit | Attending: Gastroenterology | Admitting: Gastroenterology

## 2021-02-06 ENCOUNTER — Ambulatory Visit (HOSPITAL_COMMUNITY)
Admission: RE | Admit: 2021-02-06 | Discharge: 2021-02-06 | Disposition: A | Discharge status: Home / Self Care | Payer: Medicare Other | Source: Ambulatory Visit | Attending: Gastroenterology | Admitting: Gastroenterology

## 2021-02-06 ENCOUNTER — Ambulatory Visit: Payer: Medicare Other | Admitting: Gastroenterology

## 2021-02-06 ENCOUNTER — Other Ambulatory Visit: Payer: Self-pay

## 2021-02-06 ENCOUNTER — Other Ambulatory Visit: Payer: Self-pay | Admitting: Gastroenterology

## 2021-02-06 DIAGNOSIS — K8689 Other specified diseases of pancreas: Secondary | ICD-10-CM | POA: Insufficient documentation

## 2021-02-06 DIAGNOSIS — R748 Abnormal levels of other serum enzymes: Secondary | ICD-10-CM | POA: Insufficient documentation

## 2021-02-06 DIAGNOSIS — R9389 Abnormal findings on diagnostic imaging of other specified body structures: Secondary | ICD-10-CM

## 2021-02-06 LAB — LIPASE, BLOOD: Lipase: 151 U/L — ABNORMAL HIGH (ref 11–51)

## 2021-02-06 MED ORDER — GADOBUTROL 1 MMOL/ML IV SOLN
7.0000 mL | Freq: Once | INTRAVENOUS | Status: AC | PRN
  Administered 2021-02-06: 7 mL via INTRAVENOUS

## 2021-02-13 ENCOUNTER — Telehealth: Payer: Self-pay

## 2021-02-13 NOTE — Telephone Encounter (Signed)
-----   Message from Irving Copas., MD sent at 02/13/2021 10:20 AM EST ----- Regarding: RE: Abnormal pancreas, elevated lipase KSA, Thanks for the update and speaking with the patient.  Bette Brienza, Please move forward with scheduling this patient an EUS with DJ or myself for pancreatic duct abnormality as per my notation below and discussion with PA Jodi Mourning.  We will have to get approval for Eliquis hold for the procedure and potential biopsy if something is found. Thanks.  GM  Tomma Rakers  ----- Message ----- From: Roselyn Reef Sent: 02/13/2021   9:28 AM EST To: Irving Copas., MD Subject: RE: Abnormal pancreas, elevated lipase         Hi Dr. Rush Landmark,   The patient is agreeable to proceeding with endoscopic ultrasound.   Thank you for your help! Please keep me posted on when she is scheduled.   Thanks again,   Aliene Altes, [facility]   ----- Message ----- From: Irving Copas., MD Sent: 02/11/2021  12:31 PM EST To: Erenest Rasher, PA-C Subject: RE: Abnormal pancreas, elevated lipase         KH, I do think that there is a role for endoscopic ultrasound even in the setting of the MRI not showing any evidence of a mass. Subtle lesions can sometimes be missed. Recommend that we pursue an EUS. We will need to get the approval for hold of Eliquis. If after you discussed this with the patient, she is amenable to consideration of EUS, please let me know and I will forward this then to my nurse so that we can work on trying to get her scheduled with DJ or myself. Will likely be a few weeks out based on our availability at this time. Thanks for reaching out and let let me know about the conversation. GM ----- Message ----- From: Roselyn Reef Sent: 02/09/2021   4:32 PM EST To: Irving Copas., MD Subject: Abnormal pancreas, elevated lipase             Hi Dr. Rush Landmark,   Wanted to get your opinion on this  case.   This 76 y.o. lady was admitted in September 2022 at [facility] with unprovoked PE and also found to have Lipase >5000 and dilated pancreatic duct on CT without pancreatitis. LFTs wnl. She had no abdominal pain and denies any history of pancreatitis or Fhx of pancreatic cancer. Repeat Lipase on 1/13 down to 151. MRI/MRCP 1/13 with no evidence of pancreatic mass or acute inflammatory changes. Diffuse dilatation of main pancreatic duct and pancreatic duct side branches are seen, with a "beaded" appearance. Pancreatic duct stricture is noted in the pancreatic neck, however no mass is identified. This is consistent with chronic pancreatitis. No evidence of peripancreatic fluid collections.  Do you think there is any role for EUS/ERCP in this case. I am not sure why she has such significant changes of chronic pancreatitis without any significant history.    Thanks,   Aliene Altes, [place] [facility]

## 2021-02-16 ENCOUNTER — Other Ambulatory Visit: Payer: Self-pay

## 2021-02-16 DIAGNOSIS — K8689 Other specified diseases of pancreas: Secondary | ICD-10-CM

## 2021-02-16 NOTE — Telephone Encounter (Signed)
The pt has been scheduled for EUS with GM at [facility] on 04/02/21 at 945 am Left message on machine to call back   Need to know who prescribes the Eliquis.

## 2021-02-16 NOTE — Telephone Encounter (Signed)
EUS scheduled, pt instructed and medications reviewed.  Patient instructions mailed to home.  Patient to call with any questions or concerns.    The pt states that she is no longer taking Eliquis. She was told to call if she resumes.

## 2021-03-25 ENCOUNTER — Encounter (HOSPITAL_COMMUNITY): Payer: Self-pay | Admitting: Gastroenterology

## 2021-03-25 NOTE — Progress Notes (Signed)
Attempted to obtain medical history via telephone, unable to reach at this time. I left a voicemail to return pre surgical testing department's phone call.  

## 2021-03-31 ENCOUNTER — Telehealth: Payer: Self-pay | Admitting: Gastroenterology

## 2021-03-31 NOTE — Telephone Encounter (Signed)
The pt is currently in the hospital in [place] and her daughter would like to cancel her 3/9 EUS case. I have cancelled the case and moved another patient into that spot from the wait list. The daughter will call back if/when she decides to reschedule. ?

## 2021-03-31 NOTE — Telephone Encounter (Signed)
Noted. Thank you for the update.

## 2021-03-31 NOTE — Telephone Encounter (Signed)
Patty, ?Thanks for the update. ?Sorry to hear about her hospitalization and I hope she is going to feel better soon. ?As the daughter wants to call back if/when she wants to have the procedure, we will update the referring providers. ?Thanks. ?GM ? ?FYI [facility] and [facility] ?

## 2021-04-02 ENCOUNTER — Encounter (HOSPITAL_COMMUNITY): Admission: RE | Payer: Self-pay | Source: Home / Self Care

## 2021-04-02 ENCOUNTER — Ambulatory Visit (HOSPITAL_COMMUNITY): Admission: RE | Admit: 2021-04-02 | Payer: Medicare Other | Source: Home / Self Care | Admitting: Gastroenterology

## 2021-04-02 SURGERY — UPPER ENDOSCOPIC ULTRASOUND (EUS) RADIAL
Anesthesia: Monitor Anesthesia Care

## 2021-05-13 NOTE — Telephone Encounter (Signed)
Patient is requesting to re-schedule her EUS with Dr Rush Landmark. She cancelled her procedure in March 2022. ?

## 2021-05-13 NOTE — Telephone Encounter (Signed)
The pt has been rescheduled to 07/09/21 at 1115 am at [facility] with GM ?She has been re instructed and new instructions mailed to home ?

## 2021-05-25 ENCOUNTER — Ambulatory Visit: Payer: Medicare Other | Admitting: Gastroenterology

## 2021-06-29 ENCOUNTER — Encounter (HOSPITAL_COMMUNITY): Payer: Self-pay | Admitting: Gastroenterology

## 2021-06-30 NOTE — Progress Notes (Signed)
Attempted to obtain medical history via telephone, unable to reach at this time. HIPAA compliant voicemail message left requesting return call to pre surgical testing department. 

## 2021-07-09 ENCOUNTER — Ambulatory Visit (HOSPITAL_COMMUNITY): Payer: Medicare Other | Admitting: Certified Registered"

## 2021-07-09 ENCOUNTER — Ambulatory Visit (HOSPITAL_COMMUNITY)
Admission: RE | Admit: 2021-07-09 | Discharge: 2021-07-09 | Disposition: A | Discharge status: Home / Self Care | Payer: Medicare Other | Source: Ambulatory Visit | Attending: Gastroenterology | Admitting: Gastroenterology

## 2021-07-09 ENCOUNTER — Encounter (HOSPITAL_COMMUNITY)
Admission: RE | Disposition: A | Discharge status: Home / Self Care | Payer: Self-pay | Source: Ambulatory Visit | Attending: Gastroenterology

## 2021-07-09 ENCOUNTER — Encounter (HOSPITAL_COMMUNITY): Payer: Self-pay | Admitting: Gastroenterology

## 2021-07-09 ENCOUNTER — Ambulatory Visit (HOSPITAL_BASED_OUTPATIENT_CLINIC_OR_DEPARTMENT_OTHER): Payer: Medicare Other | Admitting: Certified Registered"

## 2021-07-09 ENCOUNTER — Other Ambulatory Visit: Payer: Self-pay | Admitting: Gastroenterology

## 2021-07-09 ENCOUNTER — Other Ambulatory Visit: Payer: Self-pay

## 2021-07-09 DIAGNOSIS — K2289 Other specified disease of esophagus: Secondary | ICD-10-CM | POA: Insufficient documentation

## 2021-07-09 DIAGNOSIS — R12 Heartburn: Secondary | ICD-10-CM | POA: Insufficient documentation

## 2021-07-09 DIAGNOSIS — Z86711 Personal history of pulmonary embolism: Secondary | ICD-10-CM | POA: Diagnosis not present

## 2021-07-09 DIAGNOSIS — K449 Diaphragmatic hernia without obstruction or gangrene: Secondary | ICD-10-CM

## 2021-07-09 DIAGNOSIS — K2970 Gastritis, unspecified, without bleeding: Secondary | ICD-10-CM | POA: Diagnosis not present

## 2021-07-09 DIAGNOSIS — Z86718 Personal history of other venous thrombosis and embolism: Secondary | ICD-10-CM | POA: Diagnosis not present

## 2021-07-09 DIAGNOSIS — K209 Esophagitis, unspecified without bleeding: Secondary | ICD-10-CM | POA: Insufficient documentation

## 2021-07-09 DIAGNOSIS — K8689 Other specified diseases of pancreas: Secondary | ICD-10-CM | POA: Diagnosis not present

## 2021-07-09 DIAGNOSIS — I899 Noninfective disorder of lymphatic vessels and lymph nodes, unspecified: Secondary | ICD-10-CM | POA: Diagnosis not present

## 2021-07-09 DIAGNOSIS — C253 Malignant neoplasm of pancreatic duct: Secondary | ICD-10-CM | POA: Diagnosis not present

## 2021-07-09 HISTORY — PX: FINE NEEDLE ASPIRATION: SHX5430

## 2021-07-09 HISTORY — PX: EUS: SHX5427

## 2021-07-09 HISTORY — PX: ESOPHAGOGASTRODUODENOSCOPY: SHX5428

## 2021-07-09 HISTORY — PX: BIOPSY: SHX5522

## 2021-07-09 SURGERY — UPPER ENDOSCOPIC ULTRASOUND (EUS) RADIAL
Anesthesia: Monitor Anesthesia Care

## 2021-07-09 MED ORDER — ESOMEPRAZOLE MAGNESIUM 40 MG PO CPDR: 40.0000 mg | DELAYED_RELEASE_CAPSULE | Freq: Every day | ORAL | 1 refills | Status: DC

## 2021-07-09 MED ORDER — LACTATED RINGERS IV SOLN: INTRAVENOUS | Status: DC | PRN

## 2021-07-09 MED ORDER — FENTANYL CITRATE (PF) 100 MCG/2ML IJ SOLN: 25.0000 ug | INTRAMUSCULAR | Status: DC | PRN

## 2021-07-09 MED ORDER — LIDOCAINE 2% (20 MG/ML) 5 ML SYRINGE
INTRAMUSCULAR | Status: DC | PRN
  Administered 2021-07-09: 20 mg via INTRAVENOUS

## 2021-07-09 MED ORDER — OXYCODONE HCL 5 MG PO TABS: 5.0000 mg | ORAL_TABLET | Freq: Once | ORAL | Status: DC | PRN

## 2021-07-09 MED ORDER — PROPOFOL 10 MG/ML IV BOLUS
INTRAVENOUS | Status: DC | PRN
  Administered 2021-07-09 (×3): 20 mg via INTRAVENOUS

## 2021-07-09 MED ORDER — ONDANSETRON HCL 4 MG/2ML IJ SOLN: 4.0000 mg | Freq: Four times a day (QID) | INTRAMUSCULAR | Status: DC | PRN

## 2021-07-09 MED ORDER — OXYCODONE HCL 5 MG/5ML PO SOLN: 5.0000 mg | Freq: Once | ORAL | Status: DC | PRN

## 2021-07-09 MED ORDER — PROPOFOL 500 MG/50ML IV EMUL
INTRAVENOUS | Status: DC | PRN
  Administered 2021-07-09: 100 ug/kg/min via INTRAVENOUS

## 2021-07-09 MED ORDER — CIPROFLOXACIN IN D5W 400 MG/200ML IV SOLN
INTRAVENOUS | Status: DC | PRN
  Administered 2021-07-09: 400 mg via INTRAVENOUS

## 2021-07-09 MED ORDER — CIPROFLOXACIN IN D5W 400 MG/200ML IV SOLN
INTRAVENOUS | Status: AC
  Filled 2021-07-09: qty 200

## 2021-07-09 NOTE — Anesthesia Preprocedure Evaluation (Signed)
Anesthesia Evaluation  Patient identified by MRN, date of birth, ID band Patient awake    Reviewed: Allergy & Precautions, H&P , NPO status , Patient's Chart, lab work & pertinent test results  Airway Mallampati: II   Neck ROM: full    Dental   Pulmonary neg pulmonary ROS,    breath sounds clear to auscultation       Cardiovascular + dysrhythmias  Rhythm:regular Rate:Normal     Neuro/Psych PSYCHIATRIC DISORDERS Anxiety Depression    GI/Hepatic GERD  ,Pancreatic duct abnormality   Endo/Other    Renal/GU      Musculoskeletal  (+) Arthritis ,   Abdominal   Peds  Hematology   Anesthesia Other Findings   Reproductive/Obstetrics                             Anesthesia Physical Anesthesia Plan  ASA: 3  Anesthesia Plan: MAC   Post-op Pain Management:    Induction: Intravenous  PONV Risk Score and Plan: 2 and Propofol infusion and Treatment may vary due to age or medical condition  Airway Management Planned: Nasal Cannula  Additional Equipment:   Intra-op Plan:   Post-operative Plan:   Informed Consent: I have reviewed the patients History and Physical, chart, labs and discussed the procedure including the risks, benefits and alternatives for the proposed anesthesia with the patient or authorized representative who has indicated his/her understanding and acceptance.     Dental advisory given  Plan Discussed with: CRNA, Anesthesiologist and Surgeon  Anesthesia Plan Comments:         Anesthesia Quick Evaluation

## 2021-07-09 NOTE — Anesthesia Postprocedure Evaluation (Signed)
Anesthesia Post Note  [name]  Procedure(s) Performed: UPPER ENDOSCOPIC ULTRASOUND (EUS) RADIAL ESOPHAGOGASTRODUODENOSCOPY (EGD) BIOPSY FINE NEEDLE ASPIRATION (FNA) LINEAR     Patient location during evaluation: Endoscopy Anesthesia Type: MAC Level of consciousness: awake and alert Pain management: pain level controlled Vital Signs Assessment: post-procedure vital signs reviewed and stable Respiratory status: spontaneous breathing, nonlabored ventilation, respiratory function stable and patient connected to nasal cannula oxygen Cardiovascular status: stable and blood pressure returned to baseline Postop Assessment: no apparent nausea or vomiting Anesthetic complications: no   No notable events documented.  Last Vitals:  Vitals:   07/09/21 1145 07/09/21 1200  BP: (!) 145/91 (!) 153/82  Pulse: 73 65  Resp: 18 20  Temp:    SpO2: 97% 98%    Last Pain:  Vitals:   07/09/21 1200  TempSrc:   PainSc: 0-No pain                 Aubra Pappalardo S

## 2021-07-09 NOTE — H&P (Signed)
GASTROENTEROLOGY PROCEDURE H&P NOTE   Primary Care Physician: Sherrilee Gilles, DO  HPI: Sheryl Bender is a 76 y.o. female who presents for EGD/EUS to evaluate abnormal MRI pancreas with pancreatic duct dilation.  Patient also having new onset heartburn symptoms.  No dysphagia.  Past Medical History:  Diagnosis Date   Anxiety    Arthritis    Depression    Dysrhythmia    hx palpitations greater than 5 yrs -neg echo, stress per pt ? where or dr   GERD (gastroesophageal reflux disease)    occ   Pulmonary embolism ([place])    September 2022   Past Surgical History:  Procedure Laterality Date   ANTERIOR LAT LUMBAR FUSION Right 12/16/2015   Procedure: RIGHT LUMBAR TWO-THREE, LUMBAR THREE-FOUR, LUMBAR FOUR-FIVE ANTEROLATERAL LUMBAR INTERBODY FUSION;  Surgeon: Erline Levine, MD;  Location: [facility];  Service: Neurosurgery;  Laterality: Right;   LUMBAR PERCUTANEOUS PEDICLE SCREW 3 LEVEL Bilateral 12/16/2015   Procedure: PERCUTANEOUS PEDICLE SCREWS BILATERALLY AT LUMBAR TWO-FIVE;  Surgeon: Erline Levine, MD;  Location: [facility];  Service: Neurosurgery;  Laterality: Bilateral;   TUBAL LIGATION  1974   No current facility-administered medications for this encounter.   No current facility-administered medications for this encounter. Allergies  Allergen Reactions   Other Hives    Cherry wood just cut- "smelled it and broke out"      [employer]   Family History  Problem Relation Age of Onset   Pancreatitis Brother        alcoholic pancreatitis   Pancreatic cancer Neg Hx    Colon cancer Neg Hx    Social History   Socioeconomic History   Marital status: Widowed    Spouse name: Not on file   Number of children: Not on file   Years of education: Not on file   Highest education level: Not on file  Occupational History   Not on file  Tobacco Use   Smoking status: Never   Smokeless tobacco: Never  Substance and Sexual Activity   Alcohol use: No   Drug use: Never   Sexual activity:  Not on file  Other Topics Concern   Not on file  Social History Narrative   Not on file   Social Determinants of Health   Financial Resource Strain: Not on file  Food Insecurity: Not on file  Transportation Needs: Not on file  Physical Activity: Not on file  Stress: Not on file  Social Connections: Not on file  Intimate Partner Violence: Not on file    Physical Exam: There were no vitals filed for this visit. There is no height or weight on file to calculate BMI. GEN: NAD EYE: Sclerae anicteric ENT: MMM CV: Non-tachycardic GI: Soft, NT/ND NEURO:  Alert & Oriented x 3  Lab Results: No results for input(s): "WBC", "HGB", "HCT", "PLT" in the last 72 hours. BMET No results for input(s): "NA", "K", "CL", "CO2", "GLUCOSE", "BUN", "CREATININE", "CALCIUM" in the last 72 hours. LFT No results for input(s): "PROT", "ALBUMIN", "AST", "ALT", "ALKPHOS", "BILITOT", "BILIDIR", "IBILI" in the last 72 hours. PT/INR No results for input(s): "LABPROT", "INR" in the last 72 hours.   Impression / Plan: This is a 76 y.o.female who presents for EGD/EUS to evaluate abnormal MRI pancreas with pancreatic duct dilation.  Patient also having new onset heartburn symptoms.  No dysphagia.  The risks and benefits of endoscopic evaluation/treatment were discussed with the patient and/or family; these include but are not limited to the risk of perforation, infection,  bleeding, missed lesions, lack of diagnosis, severe illness requiring hospitalization, as well as anesthesia and sedation related illnesses.  The patient's history has been reviewed, patient examined, no change in status, and deemed stable for procedure.  The patient and/or family is agreeable to proceed.    Justice Britain, MD [facility] Office # [phone]

## 2021-07-09 NOTE — Anesthesia Procedure Notes (Signed)
Procedure Name: MAC Date/Time: 07/09/2021 10:32 AM  Performed by: Lavell Luster, CRNAPre-anesthesia Checklist: Patient identified, Emergency Drugs available, Suction available, Patient being monitored and Timeout performed Patient Re-evaluated:Patient Re-evaluated prior to induction Oxygen Delivery Method: Circle system utilized Preoxygenation: Pre-oxygenation with 100% oxygen Induction Type: IV induction Placement Confirmation: breath sounds checked- equal and bilateral and positive ETCO2 Dental Injury: Teeth and Oropharynx as per pre-operative assessment

## 2021-07-09 NOTE — Transfer of Care (Signed)
Immediate Anesthesia Transfer of Care Note  [name]  Procedure(s) Performed: UPPER ENDOSCOPIC ULTRASOUND (EUS) RADIAL ESOPHAGOGASTRODUODENOSCOPY (EGD) BIOPSY FINE NEEDLE ASPIRATION (FNA) LINEAR  Patient Location: PACU  Anesthesia Type:MAC  Level of Consciousness: drowsy and patient cooperative  Airway & Oxygen Therapy: Patient Spontanous Breathing  Post-op Assessment: Report given to RN, Post -op Vital signs reviewed and stable and Patient moving all extremities  Post vital signs: Reviewed and stable  Last Vitals:  Vitals Value Taken Time  BP 115/75 07/09/21 1131  Temp 36.9 C 07/09/21 1130  Pulse 72 07/09/21 1141  Resp 13 07/09/21 1141  SpO2 98 % 07/09/21 1141  Vitals shown include unvalidated device data.  Last Pain:  Vitals:   07/09/21 1130  TempSrc:   PainSc: 0-No pain         Complications: No notable events documented.

## 2021-07-09 NOTE — Op Note (Signed)
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 Estimated Blood Loss:     Estimated blood loss was minimal. Procedure:                Pre-Anesthesia Assessment:                           - Prior to the procedure, a History and Physical                            was performed, and patient medications and                            allergies were reviewed. The patient's tolerance of                            previous anesthesia was also reviewed. The risks                            and benefits of the procedure and the sedation                            options and risks were discussed with the patient.                            All questions were answered, and informed consent                            was obtained. Prior Anticoagulants: The patient has                            taken no previous anticoagulant or antiplatelet  agents. ASA Grade Assessment: III - A patient with                            severe systemic disease. After reviewing the risks                            and benefits, the patient was deemed in                            satisfactory condition to undergo the procedure.                           After obtaining informed consent, the  endoscope was                            passed under direct vision. Throughout the                            procedure, the patient's blood pressure, pulse, and                            oxygen saturations were monitored continuously. The                            [id number]-H190 ([id number]) Olympus endoscope was introduced                            through the mouth, and advanced to the second part                            of duodenum. The [id number]-Q190V ([id number]) Olympus                            duodenoscope was introduced through the mouth, and                            advanced to the area of papilla. The [id number]-[id number]                            ([id number]) Olympus linear ultrasound scope was                            introduced through the mouth, and advanced to the                            duodenum for ultrasound examination from the                            stomach and duodenum. The upper EUS was                            accomplished without difficulty. The patient  tolerated the procedure. Scope In: Scope Out: Findings:      ENDOSCOPIC FINDING: :      No gross lesions were noted in the majority esophagus.      Grade B distal esophagitis noted.      Two scattered islands of salmon-colored mucosa were present from 35 to       36 cm. No other visible abnormalities. Biopsies were taken with a cold       forceps for histology.      The Z-line was irregular and was found 36 cm from the incisors.      A 3 cm hiatal hernia was present.      Patchy moderate inflammation characterized by erosions, erythema and       granularity was found in the entire examined stomach. Biopsies were       taken with a cold forceps for histology and Helicobacter pylori testing.      No gross lesions were noted in the duodenal bulb, in the first portion       of the duodenum and in the second portion of the duodenum.      The ampulla was normal but hidden under a hood.       ENDOSONOGRAPHIC FINDING: :      An irregular region was identified in the genu of the pancreas (right       where the PD dilates significantly where the PD is strictured). The area       was a bit more homogenous and darker (not overtly hypoechoic). The area       measured 10 mm by 8 mm in maximal cross-sectional diameter. The outer       margins were irregular. An intact interface was seen between the       superior mesenteric artery, celiac trunk and splenoportal confluence       suggesting a lack of invasion. The remainder of the pancreas was       examined. The endosonographic appearance of parenchyma and the upstream       pancreatic duct indicated duct dilation (PDH - 1.1 mm -> 2.0 mm, PDN -       6.4 mm -> 7.8 mm, PDB - 4.2 mm, PDT - 3.9 mm), an irregularly contoured       duct, prominent ductal side-branches, a tortuous/ectatic duct and       parenchymal atrophy. Fine needle biopsy was performed of the region.       Color Doppler imaging was utilized prior to needle puncture to confirm a       lack of significant vascular structures within the needle path. Six       passes were made with the Acquire 22 gauge ultrasound core biopsy needle       using a transgastric approach. Visible cores of tissue was obtained.       Touch preps were performed. Final cytology results are pending.      Pancreatic parenchymal abnormalities were noted in the entire pancreas.       These consisted of atrophy and hyperechoic strands.      There was no sign of significant endosonographic abnormality in the       common bile duct (1.9 mm -> 3.1 mm). No stones and ducts with regular       contour were identified.      Endosonographic imaging of the ampulla showed no intramural       (subepithelial) lesion.  Endosonographic imaging in the visualized portion of the liver showed no       mass.      No malignant-appearing lymph nodes were visualized in the celiac region       (level 20), peripancreatic  region and porta hepatis region.      The celiac region was visualized. Impression:               EGD Impression:                           - No gross lesions in majority of esophagus. Grade                            B distal esophagitis noted. Salmon-colored mucosal                            islands distally suspicious for Barrett's                            esophagus. Biopsied.                           - Z-line irregular, 36 cm from the incisors.                           - 3 cm hiatal hernia.                           - Gastritis. Biopsied.                           - No gross lesions in the duodenal bulb, in the                            first portion of the duodenum and in the second                            portion of the duodenum.                           - Normal ampulla under a hood.                           EUS Impression:                           - The region where the PD is strictured was closely                            evaluated and a region of darker appearance was                            identified. This is not clearly a hypoechoic mass                            and could be chronic pancreatitis changes,  but was                            concerning in the genu of the pancreas. Cytology                            results are pending. However, the endosonographic                            appearance is suggestive of either chronic                            pancreatitis changes vs possibility of malignancy.                            Fine needle biopsy performed. This was staged T1 N0                            Mx by endosonographic criteria. The staging applies                            if a malignancy is confirmed.                           - Pancreatic parenchymal abnormalities consisting                            of atrophy and hyperechoic strands were noted in                            the entire pancreas.                           - There was no  sign of significant pathology in the                            common bile duct.                           - No malignant-appearing lymph nodes were                            visualized in the celiac region (level 20),                            peripancreatic region and porta hepatis region. Recommendation:           - The patient will be observed post-procedure,                            until all discharge criteria are met.                           - Discharge patient to home.                           -  Patient has a contact number available for                            emergencies. The signs and symptoms of potential                            delayed complications were discussed with the                            patient. Return to normal activities tomorrow.                            Written discharge instructions were provided to the                            patient.                           - Low fat diet.                           - Observe patient's clinical course.                           - Await cytology results, await path results and                            await tumor markers.                           - Send CA19-9 today.                           - Pending pathology and the results of CA19-9, will                            consider next steps in evaluation, if this returns                            as more chronic pancreatitis in nature.                           - The findings and recommendations were discussed                            with the patient.                           - The findings and recommendations were discussed                            with the designated responsible adult. Procedure Code(s):        --- Professional ---                           [phone], Esophagogastroduodenoscopy, flexible,  transoral; with transendoscopic ultrasound-guided                            intramural or transmural fine needle                             aspiration/biopsy(s), (includes endoscopic                            ultrasound examination limited to the esophagus,                            stomach or duodenum, and adjacent structures) Diagnosis Code(s):        --- Professional ---                           K22.8, Other specified diseases of esophagus                           K44.9, Diaphragmatic hernia without obstruction or                            gangrene                           K29.70, Gastritis, unspecified, without bleeding                           K86.89, Other specified diseases of pancreas                           K86.9, Disease of pancreas, unspecified                           I89.9, Noninfective disorder of lymphatic vessels                            and lymph nodes, unspecified                           R12, Heartburn CPT copyright 2019 [employer]. All rights reserved. The codes documented in this report are preliminary and upon coder review may  be revised to meet current compliance requirements. Justice Britain, MD 07/09/2021 11:58:51 AM Number of Addenda: 0

## 2021-07-10 ENCOUNTER — Telehealth: Payer: Self-pay | Admitting: Gastroenterology

## 2021-07-10 ENCOUNTER — Encounter: Payer: Self-pay | Admitting: Gastroenterology

## 2021-07-10 LAB — CYTOLOGY - NON PAP

## 2021-07-10 LAB — SURGICAL PATHOLOGY

## 2021-07-10 LAB — CANCER ANTIGEN 19-9: CA 19-9: 31 U/mL (ref 0–35)

## 2021-07-10 NOTE — Telephone Encounter (Signed)
The pt had EUS on 6/15 with GM and began to have esophageal pain and mid abd pain, and gas.  She is unable to eat due to the throat sensitivity.  The pain is not worsening but is about the same as yesterday.  Please advise

## 2021-07-10 NOTE — Telephone Encounter (Signed)
I called and spoke with the patient this afternoon before procedures.  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  She is urinating.  The discomfort is in the mid abdomen but is nonradiating.  She is not having nausea or any vomitus.  At this point, she could have postprocedural mild pancreatitis though I would have expected her to be feeling much worse.  I have given her the okay to use Tylenol 500 mg to 650 mg every 6 hours for the next few days.  If the pain progresses or worsens while taking this or she gets a point where she is no longer able to stay hydrated, she most certainly will need to come into the emergency department for further evaluation and rule out of pancreatitis post procedure.  The patient's gastric and esophageal biopsies have returned showing no evidence of H. pylori infection.  The patient's fine-needle biopsies are pending at the time of this telephone note.  The patient is aware that if things progress or worsen or she feels uncomfortable with this plan she should go to the hospital.  She prefers not going to [facility] if possible but would likely come to [facility] or [facility].  I told her we would update her once the fine-needle biopsies returned and we will put in a call for the patient to be followed up on Monday.  Patty, please call the patient on Monday to see how she is done.  Thanks. GM

## 2021-07-10 NOTE — Telephone Encounter (Signed)
Patient had procedure with Dr. Rush Landmark in the hospital yesterday.  She is still experiencing a lot of pain in her middrift area and wanted to know if this was normal or if she needed to come back in.  She lives in [place], [place] and would have to find someone to bring her.  Please call patient and advise.  Thank you.

## 2021-07-10 NOTE — Telephone Encounter (Signed)
Noted will call pt on Monday

## 2021-07-12 ENCOUNTER — Encounter (HOSPITAL_COMMUNITY): Payer: Self-pay | Admitting: Gastroenterology

## 2021-07-13 ENCOUNTER — Other Ambulatory Visit: Payer: Self-pay

## 2021-07-13 ENCOUNTER — Emergency Department (HOSPITAL_COMMUNITY): Payer: Medicare Other

## 2021-07-13 ENCOUNTER — Inpatient Hospital Stay (HOSPITAL_COMMUNITY)
Admission: EM | Admit: 2021-07-13 | Discharge: 2021-07-21 | DRG: 439 | Disposition: A | Discharge status: Home Health | Payer: Medicare Other | Attending: Internal Medicine | Admitting: Internal Medicine

## 2021-07-13 ENCOUNTER — Encounter (HOSPITAL_COMMUNITY): Payer: Self-pay | Admitting: *Deleted

## 2021-07-13 DIAGNOSIS — F32A Depression, unspecified: Secondary | ICD-10-CM | POA: Diagnosis present

## 2021-07-13 DIAGNOSIS — K219 Gastro-esophageal reflux disease without esophagitis: Secondary | ICD-10-CM | POA: Diagnosis not present

## 2021-07-13 DIAGNOSIS — K2970 Gastritis, unspecified, without bleeding: Secondary | ICD-10-CM | POA: Diagnosis present

## 2021-07-13 DIAGNOSIS — E538 Deficiency of other specified B group vitamins: Secondary | ICD-10-CM | POA: Diagnosis present

## 2021-07-13 DIAGNOSIS — D638 Anemia in other chronic diseases classified elsewhere: Secondary | ICD-10-CM | POA: Diagnosis present

## 2021-07-13 DIAGNOSIS — K863 Pseudocyst of pancreas: Secondary | ICD-10-CM | POA: Diagnosis present

## 2021-07-13 DIAGNOSIS — K5900 Constipation, unspecified: Secondary | ICD-10-CM | POA: Diagnosis present

## 2021-07-13 DIAGNOSIS — R5381 Other malaise: Secondary | ICD-10-CM

## 2021-07-13 DIAGNOSIS — C253 Malignant neoplasm of pancreatic duct: Secondary | ICD-10-CM | POA: Diagnosis present

## 2021-07-13 DIAGNOSIS — Z9109 Other allergy status, other than to drugs and biological substances: Secondary | ICD-10-CM

## 2021-07-13 DIAGNOSIS — Z638 Other specified problems related to primary support group: Secondary | ICD-10-CM

## 2021-07-13 DIAGNOSIS — C259 Malignant neoplasm of pancreas, unspecified: Secondary | ICD-10-CM

## 2021-07-13 DIAGNOSIS — D509 Iron deficiency anemia, unspecified: Secondary | ICD-10-CM | POA: Diagnosis present

## 2021-07-13 DIAGNOSIS — R1013 Epigastric pain: Secondary | ICD-10-CM | POA: Diagnosis not present

## 2021-07-13 DIAGNOSIS — Z981 Arthrodesis status: Secondary | ICD-10-CM

## 2021-07-13 DIAGNOSIS — F39 Unspecified mood [affective] disorder: Secondary | ICD-10-CM | POA: Diagnosis present

## 2021-07-13 DIAGNOSIS — Z79899 Other long term (current) drug therapy: Secondary | ICD-10-CM

## 2021-07-13 DIAGNOSIS — M1990 Unspecified osteoarthritis, unspecified site: Secondary | ICD-10-CM | POA: Diagnosis present

## 2021-07-13 DIAGNOSIS — K859 Acute pancreatitis without necrosis or infection, unspecified: Secondary | ICD-10-CM

## 2021-07-13 DIAGNOSIS — K2100 Gastro-esophageal reflux disease with esophagitis, without bleeding: Secondary | ICD-10-CM | POA: Diagnosis present

## 2021-07-13 DIAGNOSIS — K858 Other acute pancreatitis without necrosis or infection: Principal | ICD-10-CM | POA: Diagnosis present

## 2021-07-13 DIAGNOSIS — K861 Other chronic pancreatitis: Secondary | ICD-10-CM

## 2021-07-13 DIAGNOSIS — I10 Essential (primary) hypertension: Secondary | ICD-10-CM | POA: Diagnosis present

## 2021-07-13 DIAGNOSIS — E876 Hypokalemia: Secondary | ICD-10-CM | POA: Diagnosis not present

## 2021-07-13 DIAGNOSIS — R6881 Early satiety: Secondary | ICD-10-CM | POA: Diagnosis present

## 2021-07-13 DIAGNOSIS — R739 Hyperglycemia, unspecified: Secondary | ICD-10-CM | POA: Diagnosis present

## 2021-07-13 DIAGNOSIS — Z91018 Allergy to other foods: Secondary | ICD-10-CM

## 2021-07-13 DIAGNOSIS — Z6823 Body mass index (BMI) 23.0-23.9, adult: Secondary | ICD-10-CM

## 2021-07-13 DIAGNOSIS — R630 Anorexia: Secondary | ICD-10-CM | POA: Diagnosis present

## 2021-07-13 DIAGNOSIS — Z800 Family history of malignant neoplasm of digestive organs: Secondary | ICD-10-CM

## 2021-07-13 DIAGNOSIS — F410 Panic disorder [episodic paroxysmal anxiety] without agoraphobia: Secondary | ICD-10-CM | POA: Diagnosis present

## 2021-07-13 DIAGNOSIS — Z86711 Personal history of pulmonary embolism: Secondary | ICD-10-CM

## 2021-07-13 LAB — TROPONIN I (HIGH SENSITIVITY)
Troponin I (High Sensitivity): 4 ng/L (ref <18)
Troponin I (High Sensitivity): 4 ng/L (ref <18)

## 2021-07-13 LAB — COMPREHENSIVE METABOLIC PANEL
ALT: 25 U/L (ref 0–44)
AST: 29 U/L (ref 15–41)
Albumin: 3.4 g/dL — ABNORMAL LOW (ref 3.5–5.0)
Alkaline Phosphatase: 88 U/L (ref 38–126)
Anion gap: 8 (ref 5–15)
BUN: 16 mg/dL (ref 8–23)
CO2: 25 mmol/L (ref 22–32)
Calcium: 8.7 mg/dL — ABNORMAL LOW (ref 8.9–10.3)
Chloride: 105 mmol/L (ref 98–111)
Creatinine, Ser: 0.88 mg/dL (ref 0.44–1.00)
GFR, Estimated: >60 mL/min (ref >60)
Glucose, Bld: 114 mg/dL — ABNORMAL HIGH (ref 70–99)
Potassium: 3.4 mmol/L — ABNORMAL LOW (ref 3.5–5.1)
Sodium: 138 mmol/L (ref 135–145)
Total Bilirubin: 0.3 mg/dL (ref 0.3–1.2)
Total Protein: 7 g/dL (ref 6.5–8.1)

## 2021-07-13 LAB — LIPASE, BLOOD: Lipase: 313 U/L — ABNORMAL HIGH (ref 11–51)

## 2021-07-13 LAB — CBC WITH DIFFERENTIAL/PLATELET
Abs Immature Granulocytes: 0.05 10*3/uL (ref 0.00–0.07)
Basophils Absolute: 0 10*3/uL (ref 0.0–0.1)
Basophils Relative: 0 %
Eosinophils Absolute: 0.1 10*3/uL (ref 0.0–0.5)
Eosinophils Relative: 1 %
HCT: 40.3 % (ref 36.0–46.0)
Hemoglobin: 12.9 g/dL (ref 12.0–15.0)
Immature Granulocytes: 1 %
Lymphocytes Relative: 11 %
Lymphs Abs: 1.2 10*3/uL (ref 0.7–4.0)
MCH: 31.2 pg (ref 26.0–34.0)
MCHC: 32 g/dL (ref 30.0–36.0)
MCV: 97.6 fL (ref 80.0–100.0)
Monocytes Absolute: 1 10*3/uL (ref 0.1–1.0)
Monocytes Relative: 10 %
Neutro Abs: 8.1 10*3/uL — ABNORMAL HIGH (ref 1.7–7.7)
Neutrophils Relative %: 77 %
Platelets: 239 10*3/uL (ref 150–400)
RBC: 4.13 MIL/uL (ref 3.87–5.11)
RDW: 13.5 % (ref 11.5–15.5)
WBC: 10.4 10*3/uL (ref 4.0–10.5)
nRBC: 0 % (ref 0.0–0.2)

## 2021-07-13 LAB — AMYLASE: Amylase: 368 U/L — ABNORMAL HIGH (ref 28–100)

## 2021-07-13 MED ORDER — ONDANSETRON HCL 4 MG/2ML IJ SOLN
4.0000 mg | Freq: Four times a day (QID) | INTRAMUSCULAR | Status: DC | PRN
Start: 2021-07-13 — End: 2021-07-17
  Administered 2021-07-13 – 2021-07-17 (×4): 4 mg via INTRAVENOUS
  Filled 2021-07-13 (×4): qty 2

## 2021-07-13 MED ORDER — ESCITALOPRAM OXALATE 10 MG PO TABS
20.0000 mg | ORAL_TABLET | Freq: Every day | ORAL | Status: DC
  Administered 2021-07-14 – 2021-07-16 (×3): 20 mg via ORAL
  Filled 2021-07-13 (×4): qty 2

## 2021-07-13 MED ORDER — ESCITALOPRAM OXALATE 10 MG PO TABS: 30.0000 mg | ORAL_TABLET | ORAL | Status: DC

## 2021-07-13 MED ORDER — QUETIAPINE FUMARATE 25 MG PO TABS
25.0000 mg | ORAL_TABLET | Freq: Three times a day (TID) | ORAL | Status: DC
  Administered 2021-07-14 – 2021-07-21 (×24): 25 mg via ORAL
  Filled 2021-07-13 (×24): qty 1

## 2021-07-13 MED ORDER — MORPHINE SULFATE (PF) 2 MG/ML IV SOLN: 2.0000 mg | INTRAVENOUS | Status: DC | PRN

## 2021-07-13 MED ORDER — IOHEXOL 300 MG/ML  SOLN
100.0000 mL | Freq: Once | INTRAMUSCULAR | Status: AC | PRN
  Administered 2021-07-13: 100 mL via INTRAVENOUS

## 2021-07-13 MED ORDER — SODIUM CHLORIDE 0.9 % IV SOLN: INTRAVENOUS | Status: DC

## 2021-07-13 MED ORDER — MIRTAZAPINE 15 MG PO TABS
30.0000 mg | ORAL_TABLET | Freq: Every day | ORAL | Status: DC
  Administered 2021-07-14 – 2021-07-16 (×3): 30 mg via ORAL
  Filled 2021-07-13 (×4): qty 2

## 2021-07-13 MED ORDER — ACETAMINOPHEN 325 MG PO TABS
650.0000 mg | ORAL_TABLET | Freq: Four times a day (QID) | ORAL | Status: DC | PRN
  Administered 2021-07-13 – 2021-07-21 (×14): 650 mg via ORAL
  Filled 2021-07-13 (×15): qty 2

## 2021-07-13 MED ORDER — LACTATED RINGERS IV BOLUS
1000.0000 mL | Freq: Once | INTRAVENOUS | Status: AC
  Administered 2021-07-13: 1000 mL via INTRAVENOUS

## 2021-07-13 MED ORDER — HEPARIN SODIUM (PORCINE) 5000 UNIT/ML IJ SOLN
5000.0000 [IU] | Freq: Three times a day (TID) | INTRAMUSCULAR | Status: DC
  Administered 2021-07-14 – 2021-07-15 (×5): 5000 [IU] via SUBCUTANEOUS
  Filled 2021-07-13 (×5): qty 1

## 2021-07-13 MED ORDER — OXYCODONE HCL 5 MG PO TABS
5.0000 mg | ORAL_TABLET | ORAL | Status: DC | PRN
  Filled 2021-07-13: qty 1

## 2021-07-13 MED ORDER — LACTATED RINGERS IV SOLN: INTRAVENOUS | Status: DC

## 2021-07-13 MED ORDER — ACETAMINOPHEN 650 MG RE SUPP: 650.0000 mg | Freq: Four times a day (QID) | RECTAL | Status: DC | PRN

## 2021-07-13 MED ORDER — BUSPIRONE HCL 5 MG PO TABS
20.0000 mg | ORAL_TABLET | Freq: Two times a day (BID) | ORAL | Status: DC
  Administered 2021-07-14 – 2021-07-17 (×7): 20 mg via ORAL
  Filled 2021-07-13 (×8): qty 4

## 2021-07-13 MED ORDER — POTASSIUM CHLORIDE 10 MEQ/100ML IV SOLN
10.0000 meq | INTRAVENOUS | Status: AC
  Administered 2021-07-13 – 2021-07-14 (×3): 10 meq via INTRAVENOUS
  Filled 2021-07-13 (×3): qty 100

## 2021-07-13 MED ORDER — PANTOPRAZOLE SODIUM 40 MG PO TBEC
40.0000 mg | DELAYED_RELEASE_TABLET | Freq: Every day | ORAL | Status: DC
  Administered 2021-07-14 – 2021-07-15 (×2): 40 mg via ORAL
  Filled 2021-07-13 (×2): qty 1

## 2021-07-13 MED ORDER — ONDANSETRON HCL 4 MG PO TABS
4.0000 mg | ORAL_TABLET | Freq: Four times a day (QID) | ORAL | Status: DC | PRN
  Administered 2021-07-14: 4 mg via ORAL
  Filled 2021-07-13: qty 1

## 2021-07-13 MED ORDER — ESCITALOPRAM OXALATE 10 MG PO TABS
10.0000 mg | ORAL_TABLET | Freq: Every day | ORAL | Status: DC
  Administered 2021-07-14 – 2021-07-21 (×8): 10 mg via ORAL
  Filled 2021-07-13 (×8): qty 1

## 2021-07-13 MED ORDER — FENTANYL CITRATE PF 50 MCG/ML IJ SOSY: 25.0000 ug | PREFILLED_SYRINGE | INTRAMUSCULAR | Status: DC | PRN

## 2021-07-13 MED ORDER — BUPROPION HCL ER (XL) 300 MG PO TB24
300.0000 mg | ORAL_TABLET | Freq: Every day | ORAL | Status: DC
  Administered 2021-07-14 – 2021-07-21 (×8): 300 mg via ORAL
  Filled 2021-07-13 (×8): qty 1

## 2021-07-13 MED ORDER — METOPROLOL SUCCINATE ER 25 MG PO TB24
25.0000 mg | ORAL_TABLET | Freq: Every day | ORAL | Status: DC
  Administered 2021-07-14 – 2021-07-20 (×7): 25 mg via ORAL
  Filled 2021-07-13 (×8): qty 1

## 2021-07-13 MED ORDER — QUETIAPINE FUMARATE 100 MG PO TABS
200.0000 mg | ORAL_TABLET | Freq: Every day | ORAL | Status: DC
  Administered 2021-07-14 – 2021-07-16 (×3): 200 mg via ORAL
  Filled 2021-07-13 (×4): qty 2

## 2021-07-13 NOTE — Assessment & Plan Note (Signed)
Continue PPI ?

## 2021-07-13 NOTE — Telephone Encounter (Signed)
I spoke with the pt and she tells me she is not doing well.  She is weak, very dehydrated and continues to have bloating and abd discomfort.  She is calling her church and getting someone to take her to [facility].

## 2021-07-13 NOTE — ED Provider Notes (Signed)
[facility] EMERGENCY DEPARTMENT Provider Note   CSN: [id number] Arrival date & time: 07/13/21  1217     History {Add pertinent medical, surgical, social history, OB history to HPI:1} Chief Complaint  Patient presents with   Abdominal Pain   Chest Pain    Sheryl Bender is a 76 y.o. female.   Abdominal Pain Associated symptoms: chest pain and nausea   Chest Pain Associated symptoms: abdominal pain and nausea   Patient presents for abdominal pain.  Her medical history includes anxiety, arthritis, depression, GERD, PE.  She recently had an abnormal MRI finding on her pancreas.  She underwent EGD/EUS with Dr. Rush Landmark.  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  She has been informed of this.  Since her procedure, she has had worsened epigastric pain.  Pain does not radiate.  She has been treating her pain at home with Tylenol.  She has had some mild nausea but has not had vomiting.  She states that she forces herself to eat.  She has had some constipation which she treats with cashew nuts.  She did have a bowel movement yesterday.  Patient currently lives alone and has history of anxiety and depression.  These have worsened with her recent diagnosis.     Home Medications Prior to Admission medications   Medication Sig Start Date End Date Taking? Authorizing Provider  acetaminophen (TYLENOL) 500 MG tablet Take 1,000 mg by mouth every 8 (eight) hours as needed for moderate pain.    [provider]  buPROPion (WELLBUTRIN XL) 300 MG 24 hr tablet Take 300 mg by mouth daily. 06/15/21   [provider]  busPIRone (BUSPAR) 10 MG tablet Take 20 mg by mouth 2 (two) times daily. 01/27/21   [provider]  Cholecalciferol (VITAMIN D) 125 MCG (5000 UT) CAPS Take 5,000 Units by mouth every other day.    [provider]  ciclopirox (LOPROX) 0.77 % cream Apply 1 application. topically at bedtime. 01/20/21   [provider]  escitalopram (LEXAPRO) 20 MG tablet Take 30 mg by mouth daily.    [provider]  esomeprazole (NEXIUM) 40 MG capsule Take 1 capsule (40 mg total) by mouth daily. 07/09/21 07/09/22  Mansouraty, Telford Nab., MD  metoprolol succinate (TOPROL-XL) 25 MG 24 hr tablet Take 25 mg by mouth daily.    [provider]  mirtazapine (REMERON) 30 MG tablet Take 30 mg by mouth at bedtime. 06/15/21   [provider]  QUEtiapine (SEROQUEL) 200 MG tablet Take 200 mg by mouth at bedtime. 03/27/21   [provider]  QUEtiapine (SEROQUEL) 50 MG tablet Take 25 mg by mouth 3 (three) times daily. 04/20/21   [provider]      Allergies    Other and [place]    Review of Systems   Review of Systems  Cardiovascular:  Positive for chest pain.  Gastrointestinal:  Positive for abdominal pain and nausea.  Psychiatric/Behavioral:  Positive for dysphoric mood. The patient is nervous/anxious.   All other systems reviewed and are negative.   Physical Exam Updated Vital Signs BP 126/74 (BP Location: Right Arm)   Pulse 88   Temp 97.7 F (36.5 C) (Oral)   Resp 16   SpO2 98%  Physical Exam Vitals and nursing note reviewed.  Constitutional:      General: She is not in acute distress.    Appearance: She is well-developed and normal weight. She is not ill-appearing, toxic-appearing or diaphoretic.  HENT:  Head: Normocephalic and atraumatic.     Mouth/Throat:     Mouth: Mucous membranes are moist.     Pharynx: Oropharynx is clear.  Eyes:     Extraocular Movements: Extraocular movements intact.     Conjunctiva/sclera: Conjunctivae normal.  Cardiovascular:     Rate and Rhythm: Normal rate and regular rhythm.     Heart sounds: No murmur heard. Pulmonary:     Effort: Pulmonary effort is normal. No respiratory distress.     Breath sounds: Normal breath sounds. No wheezing or rales.  Chest:     Chest wall: No tenderness.  Abdominal:     Palpations: Abdomen  is soft.     Tenderness: There is abdominal tenderness in the epigastric area. There is no guarding or rebound.  Musculoskeletal:        General: No swelling.     Cervical back: Neck supple.  Skin:    General: Skin is warm and dry.     Capillary Refill: Capillary refill takes less than 2 seconds.     Coloration: Skin is not cyanotic, jaundiced or pale.  Neurological:     General: No focal deficit present.     Mental Status: She is alert and oriented to person, place, and time.     Cranial Nerves: No cranial nerve deficit.     Motor: No weakness.  Psychiatric:        Mood and Affect: Mood is anxious and depressed.        Behavior: Behavior normal.     ED Results / Procedures / Treatments   Labs (all labs ordered are listed, but only abnormal results are displayed) Labs Reviewed  CBC WITH DIFFERENTIAL/PLATELET - Abnormal; Notable for the following components:      Result Value   Neutro Abs 8.1 (*)    All other components within normal limits  COMPREHENSIVE METABOLIC PANEL - Abnormal; Notable for the following components:   Potassium 3.4 (*)    Glucose, Bld 114 (*)    Calcium 8.7 (*)    Albumin 3.4 (*)    All other components within normal limits  TROPONIN I (HIGH SENSITIVITY)  TROPONIN I (HIGH SENSITIVITY)    EKG EKG Interpretation  Date/Time:  Monday July 13 2021 12:40:03 EDT Ventricular Rate:  85 PR Interval:  158 QRS Duration: 68 QT Interval:  346 QTC Calculation: 411 R Axis:   40 Text Interpretation: Normal sinus rhythm Low voltage QRS No previous ECGs available Confirmed by Lajean Saver [phone]) on 07/13/2021 1:31:26 PM  Radiology DG Chest 2 View  Result Date: 07/13/2021 CLINICAL DATA:  Chest pain EXAM: CHEST - 2 VIEW COMPARISON:  None Available. FINDINGS: Heart size and mediastinal contours are within normal limits. No suspicious pulmonary opacities identified. No pleural effusion or pneumothorax visualized. No acute osseous abnormality appreciated. IMPRESSION:  No acute intrathoracic process identified. Electronically Signed   By: Ofilia Neas M.D.   On: 07/13/2021 13:16    Procedures Procedures  {Document cardiac monitor, telemetry assessment procedure when appropriate:1}  Medications Ordered in ED Medications - No data to display  ED Course/ Medical Decision Making/ A&P                           Medical Decision Making Amount and/or Complexity of Data Reviewed Labs: ordered. Radiology: ordered.   ***  {Document critical care time when appropriate:1} {Document review of labs and clinical decision tools ie heart score, Chads2Vasc2 etc:1}  {Document your  independent review of radiology images, and any outside records:1} {Document your discussion with family members, caretakers, and with consultants:1} {Document social determinants of health affecting pt's care:1} {Document your decision making why or why not admission, treatments were needed:1} Final Clinical Impression(s) / ED Diagnoses Final diagnoses:  None    Rx / DC Orders ED Discharge Orders     None

## 2021-07-13 NOTE — Telephone Encounter (Signed)
Thank you for update and getting the referrals and imaging setup. GM

## 2021-07-13 NOTE — ED Triage Notes (Signed)
Pt with recent endoscopy on Thursday, was called and told that she has pancreatic cancer.  Pt with abd pain, instructed to come through the ER for possible admission. + nausea

## 2021-07-13 NOTE — Assessment & Plan Note (Signed)
Continue Wellbutrin, BuSpar, Seroquel, Lexapro Continue to monitor

## 2021-07-13 NOTE — ED Triage Notes (Signed)
Pt states left chest pain off and on since endoscopy as well. +belching

## 2021-07-13 NOTE — H&P (Signed)
History and Physical    [name] OVZ:[id number] DOB: [birth year] DOA: 07/13/2021 DOS: the patient was seen and examined on 07/13/2021 PCP: Sherrilee Gilles, DO  Patient coming from: Home  Chief Complaint:  Chief Complaint  Patient presents with   Abdominal Pain   Chest Pain   HPI: Sheryl Bender is a 76 y.o. female with medical history significant of anxiety, depression, GERD, pulmonary embolism, and recent diagnosis of pancreatic cancer presents to the ED with a chief complaint of abdominal pain.  Patient reports that 4 days ago she had an EGD with dilation of her pancreatic duct and biopsies.  She reports the biopsies came back positive for cancer.  She reports that her GI doctors and they will have to plan for several scans to determine what treatment plan to take.  She discussed with her GI doctor that she was having terrible abdominal pain after her procedure, and he told her she was not better in 24-36 hours then to come into the ER.  Patient reports her pain started immediately after her endoscopy.  Her abdomen felt swollen and she describes the pain as sharp and cramping.  The pain is intermittent, and unpredictable.  It is no worse at night or during the day.  She took Tylenol 500-750 mg and it offered some relief, but the pain was still there.  Her last normal meal was yesterday and was obtained but her sandwich.  Her last normal bowel movement was yesterday but it was constipated.  She does take cashews to help with her constipation and reports that she took some cashews today.  She is felt nauseous but no vomiting.  She has had burning in her epigastrium and esophagus consistent with her GERD symptoms.  She has not slept well for 2 nights.  She has not been drinking enough fluids and reports her mouth is quite dry at night.  On review of systems she does report her chest pain is associated with the pancreatic pain.  She reports it feels like a tightness radiating up from the left  upper quadrant all the way to her ear.  Is also associated with a sore throat.  She has not had a chest pain today.  She has no other complaints at this time.  Of note: Patient reports she was diagnosed with her PE in [place].  She was put on a blood thinner and told that she could stop it after 60 days.  The PE is what started her work-up that revealed pancreatic abnormalities.  Patient does not smoke, does not drink alcohol, does not use illicit drugs.  She is vaccinated for COVID.  Patient is full code. Review of Systems: As mentioned in the history of present illness. All other systems reviewed and are negative. Past Medical History:  Diagnosis Date   Anxiety    Arthritis    Depression    Dysrhythmia    hx palpitations greater than 5 yrs -neg echo, stress per pt ? where or dr   GERD (gastroesophageal reflux disease)    occ   Pulmonary embolism ([place])    September 2022   Past Surgical History:  Procedure Laterality Date   ANTERIOR LAT LUMBAR FUSION Right 12/16/2015   Procedure: RIGHT LUMBAR TWO-THREE, LUMBAR THREE-FOUR, LUMBAR FOUR-FIVE ANTEROLATERAL LUMBAR INTERBODY FUSION;  Surgeon: Erline Levine, MD;  Location: [place];  Service: Neurosurgery;  Laterality: Right;   BIOPSY  07/09/2021   Procedure: BIOPSY;  Surgeon: Rush Landmark Telford Nab., MD;  Location: [place];  Service: Gastroenterology;;   ESOPHAGOGASTRODUODENOSCOPY N/A 07/09/2021   Procedure: ESOPHAGOGASTRODUODENOSCOPY (EGD);  Surgeon: Irving Copas., MD;  Location: [place];  Service: Gastroenterology;  Laterality: N/A;   EUS N/A 07/09/2021   Procedure: UPPER ENDOSCOPIC ULTRASOUND (EUS) RADIAL;  Surgeon: Irving Copas., MD;  Location: [place];  Service: Gastroenterology;  Laterality: N/A;   FINE NEEDLE ASPIRATION  07/09/2021   Procedure: FINE NEEDLE ASPIRATION (FNA) LINEAR;  Surgeon: Irving Copas., MD;  Location: [place];  Service: Gastroenterology;;   LUMBAR PERCUTANEOUS PEDICLE SCREW  3 LEVEL Bilateral 12/16/2015   Procedure: PERCUTANEOUS PEDICLE SCREWS BILATERALLY AT LUMBAR TWO-FIVE;  Surgeon: Erline Levine, MD;  Location: [facility];  Service: Neurosurgery;  Laterality: Bilateral;   TUBAL LIGATION  1974   Social History:  reports that she has never smoked. She has never used smokeless tobacco. She reports that she does not drink alcohol and does not use drugs.  Allergies  Allergen Reactions   Other Hives    Cherry wood just cut- "smelled it and broke out"      [employer]    Family History  Problem Relation Age of Onset   Pancreatitis Brother        alcoholic pancreatitis   Pancreatic cancer Neg Hx    Colon cancer Neg Hx     Prior to Admission medications   Medication Sig Start Date End Date Taking? Authorizing Provider  buPROPion (WELLBUTRIN XL) 300 MG 24 hr tablet Take 300 mg by mouth daily. 06/15/21  Yes [provider]  busPIRone (BUSPAR) 10 MG tablet Take 20 mg by mouth 2 (two) times daily. 01/27/21  Yes [provider]  Cholecalciferol (VITAMIN D) 125 MCG (5000 UT) CAPS Take 5,000 Units by mouth every other day.   Yes [provider]  Cholecalciferol (VITAMIN D3) 10 MCG (400 UNIT) tablet Take by mouth. 04/20/21  Yes [provider]  escitalopram (LEXAPRO) 20 MG tablet Take 30 mg by mouth See admin instructions. Take 1/2 tablet in the morning and 1 tablet at 830pm   Yes [provider]  esomeprazole (NEXIUM) 40 MG capsule Take 1 capsule (40 mg total) by mouth daily. Patient taking differently: Take 20 mg by mouth daily. 07/09/21 07/09/22 Yes Mansouraty, Telford Nab., MD  metoprolol succinate (TOPROL-XL) 25 MG 24 hr tablet Take 25 mg by mouth daily.   Yes [provider]  mirtazapine (REMERON) 30 MG tablet Take 30 mg by mouth at bedtime. 06/15/21  Yes [provider]  QUEtiapine (SEROQUEL) 200 MG tablet Take 200 mg by mouth at bedtime. 03/27/21  Yes [provider]  QUEtiapine (SEROQUEL) 50 MG  tablet Take 25 mg by mouth 3 (three) times daily. 04/20/21  Yes [provider]  acetaminophen (TYLENOL) 500 MG tablet Take 1,000 mg by mouth every 8 (eight) hours as needed for moderate pain.    [provider]    Physical Exam: Vitals:   07/13/21 2000 07/13/21 2030 07/13/21 2100 07/13/21 2130  BP: 120/63 137/71 129/67 131/69  Pulse: 69 81 79 80  Resp: '16 16 15 16  '$ Temp:      TempSrc:      SpO2: 94% 100% 99% 94%   1.  General: Patient lying supine in bed, visibly anxious, no acute distress   2. Psychiatric: Alert and oriented x 3, mood and behavior normal for situation, pleasant and cooperative with exam   3. Neurologic: Speech and language are normal, face is symmetric, moves all 4 extremities voluntarily, at baseline without  acute deficits on limited exam   4. HEENMT:  Head is atraumatic, normocephalic, pupils reactive to light, neck is supple, trachea is midline, mucous membranes are moist   5. Respiratory : Lungs are clear to auscultation bilaterally without wheezing, rhonchi, rales, no cyanosis, no increase in work of breathing or accessory muscle use   6. Cardiovascular : Heart rate normal, rhythm is regular, no murmurs, rubs or gallops, no peripheral edema, peripheral pulses palpated   7. Gastrointestinal:  Abdomen is soft, nondistended, tender to palpation in the right upper quadrant and left upper quadrant, bowel sounds active, no masses or organomegaly palpated   8. Skin:  Skin is warm, dry and intact without rashes, acute lesions, or ulcers on limited exam   9.Musculoskeletal:  No acute deformities or trauma, no asymmetry in tone, no peripheral edema, peripheral pulses palpated, tenderness to palpation of left calf  Data Reviewed: In the ED  Temp 97.7, heart rate 69-93, respiratory rate 13-18, blood pressure 120/63-157/88, satting at 94% No leukocytosis with a white blood cell count of 10.4, hemoglobin 12.9, platelets 239 Chemistry shows a  hypokalemia at 3.4 otherwise unremarkable Albumin is mildly low at 3.4 Amylase is elevated at 368, lipase 315 Troponin 4, 4 CT abdomen 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  The consensus is patient can be admitted here, no further procedures should be needed, and to call Dr. Rush Landmark with any extra questions.  Assessment and Plan: Mood disorder (HCC) Continue Wellbutrin, BuSpar, Seroquel, Lexapro Continue to monitor  GERD (gastroesophageal reflux disease) Continue PPI  Hypokalemia Potassium 3.4 replaced with 30 mEq potassium Recheck along with magnesium in the a.m.  Pancreatitis, acute - Recent pancreatic duct dilation 4 days ago -Abdominal pain, bloating, lipase 313, amylase 368 -CT scan shows a new fluid collection posterior to the gastric antrum and ventral to the pancreas that is most consistent with pancreatic pseudocyst -N.p.o. except for sips and chips -Trend lipase in the a.m. -Pain control with pain scale -Continue to monitor        Advance Care Planning:   Code Status: Prior full  Consults: GI  Family Communication: No family at bedside  Severity of Illness: The appropriate patient status for this patient is OBSERVATION. Observation status is judged to be reasonable and necessary in order to provide the required intensity of service to ensure the patient's safety. The patient's presenting symptoms, physical exam findings, and initial radiographic and laboratory data in the context of their medical condition is felt to place them at decreased risk for further clinical deterioration. Furthermore, it is anticipated that the patient will be medically stable for discharge from the hospital within 2 midnights of admission.   Author: Rolla Plate,  DO 07/13/2021 10:13 PM  For on call review [email or web address].

## 2021-07-13 NOTE — Telephone Encounter (Signed)
Please see separate note with new diagnosis of Cancer based on final cytology last week.  If she continues to not feel well then CBC/CMP/Lipase/Amylase should be drawn (could have drawn at [facility] if she desires).  If she can stay hydrated and has been using the pain medication and can do the labs that is OK.  Otherwise, she should go to the hospital for further evaluation (she should come to [facility] or [facility] or [facility] if that is the case).  Thanks. GM

## 2021-07-13 NOTE — ED Notes (Signed)
Patient transported to CT 

## 2021-07-13 NOTE — Telephone Encounter (Signed)
The pt tells me that she is going to [facility] for evaluation due to her not feeling well at all.  (She is in tears on the phone)

## 2021-07-13 NOTE — Assessment & Plan Note (Addendum)
Post procedure pancreatitis (fine needle biopsy).  Pancreatic cancer.   Abdominal pain continue to improve, but she has intermittent nausea with no vomiting. Continue to be very weak and deconditioned.  Diet has been advanced to soft with good toleration.   CT chest with no signs of metastatic lesions.   Plan to continue supportive medical care with as needed analgesics and antiemetics. Increase frequency of ondansetron to as needed every 4 hrs, will give one dose of phenergan today. Continue soft diet Continue antiacid therapy Out of bed to chair tid with meals, continue with Pt and Ot.   Patient would like to continue with cancer therapy with Oncology in [place]

## 2021-07-13 NOTE — Assessment & Plan Note (Addendum)
Stable renal function and electrolytes. IV fluids have been discontinued  Renal function today with serum cr at 0,74, K is 4,2 and serum bicarbonate at 25.  Plan to continue close follow up renal function.

## 2021-07-14 DIAGNOSIS — K858 Other acute pancreatitis without necrosis or infection: Secondary | ICD-10-CM | POA: Diagnosis present

## 2021-07-14 DIAGNOSIS — Z91018 Allergy to other foods: Secondary | ICD-10-CM | POA: Diagnosis not present

## 2021-07-14 DIAGNOSIS — C253 Malignant neoplasm of pancreatic duct: Secondary | ICD-10-CM | POA: Diagnosis present

## 2021-07-14 DIAGNOSIS — Z6823 Body mass index (BMI) 23.0-23.9, adult: Secondary | ICD-10-CM | POA: Diagnosis not present

## 2021-07-14 DIAGNOSIS — K5900 Constipation, unspecified: Secondary | ICD-10-CM | POA: Diagnosis not present

## 2021-07-14 DIAGNOSIS — Z981 Arthrodesis status: Secondary | ICD-10-CM | POA: Diagnosis not present

## 2021-07-14 DIAGNOSIS — F32A Depression, unspecified: Secondary | ICD-10-CM | POA: Diagnosis present

## 2021-07-14 DIAGNOSIS — D638 Anemia in other chronic diseases classified elsewhere: Secondary | ICD-10-CM | POA: Diagnosis present

## 2021-07-14 DIAGNOSIS — R739 Hyperglycemia, unspecified: Secondary | ICD-10-CM | POA: Diagnosis present

## 2021-07-14 DIAGNOSIS — E876 Hypokalemia: Secondary | ICD-10-CM | POA: Diagnosis present

## 2021-07-14 DIAGNOSIS — Z800 Family history of malignant neoplasm of digestive organs: Secondary | ICD-10-CM | POA: Diagnosis not present

## 2021-07-14 DIAGNOSIS — D509 Iron deficiency anemia, unspecified: Secondary | ICD-10-CM | POA: Diagnosis present

## 2021-07-14 DIAGNOSIS — M1990 Unspecified osteoarthritis, unspecified site: Secondary | ICD-10-CM | POA: Diagnosis present

## 2021-07-14 DIAGNOSIS — K2100 Gastro-esophageal reflux disease with esophagitis, without bleeding: Secondary | ICD-10-CM | POA: Diagnosis present

## 2021-07-14 DIAGNOSIS — F410 Panic disorder [episodic paroxysmal anxiety] without agoraphobia: Secondary | ICD-10-CM | POA: Diagnosis present

## 2021-07-14 DIAGNOSIS — K2970 Gastritis, unspecified, without bleeding: Secondary | ICD-10-CM | POA: Diagnosis present

## 2021-07-14 DIAGNOSIS — Z638 Other specified problems related to primary support group: Secondary | ICD-10-CM | POA: Diagnosis not present

## 2021-07-14 DIAGNOSIS — K863 Pseudocyst of pancreas: Secondary | ICD-10-CM | POA: Diagnosis present

## 2021-07-14 DIAGNOSIS — R1013 Epigastric pain: Secondary | ICD-10-CM | POA: Diagnosis present

## 2021-07-14 DIAGNOSIS — K861 Other chronic pancreatitis: Secondary | ICD-10-CM | POA: Diagnosis not present

## 2021-07-14 DIAGNOSIS — R6881 Early satiety: Secondary | ICD-10-CM | POA: Diagnosis present

## 2021-07-14 DIAGNOSIS — Z86711 Personal history of pulmonary embolism: Secondary | ICD-10-CM | POA: Diagnosis not present

## 2021-07-14 DIAGNOSIS — K859 Acute pancreatitis without necrosis or infection, unspecified: Secondary | ICD-10-CM | POA: Diagnosis not present

## 2021-07-14 DIAGNOSIS — I10 Essential (primary) hypertension: Secondary | ICD-10-CM | POA: Diagnosis present

## 2021-07-14 DIAGNOSIS — R630 Anorexia: Secondary | ICD-10-CM | POA: Diagnosis present

## 2021-07-14 DIAGNOSIS — E538 Deficiency of other specified B group vitamins: Secondary | ICD-10-CM | POA: Diagnosis present

## 2021-07-14 LAB — IRON AND TIBC
Iron: 17 ug/dL — ABNORMAL LOW (ref 28–170)
Saturation Ratios: 11 % (ref 10.4–31.8)
TIBC: 154 ug/dL — ABNORMAL LOW (ref 250–450)
UIBC: 137 ug/dL

## 2021-07-14 LAB — COMPREHENSIVE METABOLIC PANEL
ALT: 29 U/L (ref 0–44)
AST: 32 U/L (ref 15–41)
Albumin: 2.8 g/dL — ABNORMAL LOW (ref 3.5–5.0)
Alkaline Phosphatase: 76 U/L (ref 38–126)
Anion gap: 8 (ref 5–15)
BUN: 13 mg/dL (ref 8–23)
CO2: 20 mmol/L — ABNORMAL LOW (ref 22–32)
Calcium: 8.1 mg/dL — ABNORMAL LOW (ref 8.9–10.3)
Chloride: 106 mmol/L (ref 98–111)
Creatinine, Ser: 0.71 mg/dL (ref 0.44–1.00)
GFR, Estimated: >60 mL/min (ref >60)
Glucose, Bld: 88 mg/dL (ref 70–99)
Potassium: 4.3 mmol/L (ref 3.5–5.1)
Sodium: 134 mmol/L — ABNORMAL LOW (ref 135–145)
Total Bilirubin: 1 mg/dL (ref 0.3–1.2)
Total Protein: 5.9 g/dL — ABNORMAL LOW (ref 6.5–8.1)

## 2021-07-14 LAB — CBC WITH DIFFERENTIAL/PLATELET
Abs Immature Granulocytes: 0.08 10*3/uL — ABNORMAL HIGH (ref 0.00–0.07)
Basophils Absolute: 0 10*3/uL (ref 0.0–0.1)
Basophils Relative: 0 %
Eosinophils Absolute: 0.1 10*3/uL (ref 0.0–0.5)
Eosinophils Relative: 1 %
HCT: 35.7 % — ABNORMAL LOW (ref 36.0–46.0)
Hemoglobin: 11.4 g/dL — ABNORMAL LOW (ref 12.0–15.0)
Immature Granulocytes: 1 %
Lymphocytes Relative: 10 %
Lymphs Abs: 1 10*3/uL (ref 0.7–4.0)
MCH: 31 pg (ref 26.0–34.0)
MCHC: 31.9 g/dL (ref 30.0–36.0)
MCV: 97 fL (ref 80.0–100.0)
Monocytes Absolute: 1 10*3/uL (ref 0.1–1.0)
Monocytes Relative: 10 %
Neutro Abs: 8.1 10*3/uL — ABNORMAL HIGH (ref 1.7–7.7)
Neutrophils Relative %: 78 %
Platelets: 223 10*3/uL (ref 150–400)
RBC: 3.68 MIL/uL — ABNORMAL LOW (ref 3.87–5.11)
RDW: 13.6 % (ref 11.5–15.5)
WBC: 10.2 10*3/uL (ref 4.0–10.5)
nRBC: 0 % (ref 0.0–0.2)

## 2021-07-14 LAB — FERRITIN: Ferritin: 273 ng/mL (ref 11–307)

## 2021-07-14 LAB — LIPASE, BLOOD: Lipase: 355 U/L — ABNORMAL HIGH (ref 11–51)

## 2021-07-14 LAB — VITAMIN B12: Vitamin B-12: 175 pg/mL — ABNORMAL LOW (ref 180–914)

## 2021-07-14 LAB — RETICULOCYTES
Immature Retic Fract: 10.1 % (ref 2.3–15.9)
RBC.: 3.49 MIL/uL — ABNORMAL LOW (ref 3.87–5.11)
Retic Count, Absolute: 38.4 10*3/uL (ref 19.0–186.0)
Retic Ct Pct: 1.1 % (ref 0.4–3.1)

## 2021-07-14 LAB — FOLATE: Folate: 6.5 ng/mL (ref >5.9)

## 2021-07-14 LAB — MAGNESIUM: Magnesium: 2.2 mg/dL (ref 1.7–2.4)

## 2021-07-14 MED ORDER — OXYMETAZOLINE HCL 0.05 % NA SOLN
1.0000 | Freq: Two times a day (BID) | NASAL | Status: AC
  Administered 2021-07-14 – 2021-07-16 (×5): 1 via NASAL
  Filled 2021-07-14: qty 30

## 2021-07-14 MED ORDER — LACTATED RINGERS IV SOLN: INTRAVENOUS | Status: DC

## 2021-07-14 MED ORDER — POLYETHYLENE GLYCOL 3350 17 G PO PACK
17.0000 g | PACK | Freq: Every day | ORAL | Status: DC
  Administered 2021-07-14 – 2021-07-16 (×3): 17 g via ORAL
  Filled 2021-07-14 (×3): qty 1

## 2021-07-14 NOTE — TOC Progression Note (Signed)
Transition of Care [facility]) - Progression Note    Patient Details  Name: Sheryl Bender MRN: [id number] Date of Birth: [birth year]  [facility] CM/SW Contact  Salome Arnt, [place] Phone Number: 07/14/2021, 8:17 AM  Clinical Narrative:  Transition of Care [facility]) Screening Note   Patient Details  Name: Sheryl Bender Date of Birth: Mar 29, 1945   Transition of Care [employer] [facility]) CM/SW Contact:    Salome Arnt, [place] Phone Number: 07/14/2021, 8:17 AM    Transition of Care Department [facility]) has reviewed patient and no TOC needs have been identified at this time. We will continue to monitor patient advancement through interdisciplinary progression rounds. If new patient transition needs arise, please place a TOC consult.             Expected Discharge Plan and Services                                                 Social Determinants of Health (SDOH) Interventions    Readmission Risk Interventions     No data to display

## 2021-07-14 NOTE — Plan of Care (Signed)
Pt alert and oriented x 4. Up with assist due to weakness. Alarm on bed. Pt pain controlled with tylenol per pt request.  Problem: Education: Goal: Knowledge of General Education information will improve Description: Including pain rating scale, medication(s)/side effects and non-pharmacologic comfort measures Outcome: Progressing   Problem: Health Behavior/Discharge Planning: Goal: Ability to manage health-related needs will improve Outcome: Progressing   Problem: Clinical Measurements: Goal: Ability to maintain clinical measurements within normal limits will improve Outcome: Progressing Goal: Will remain free from infection Outcome: Progressing Goal: Diagnostic test results will improve Outcome: Progressing Goal: Respiratory complications will improve Outcome: Progressing Goal: Cardiovascular complication will be avoided Outcome: Progressing   Problem: Activity: Goal: Risk for activity intolerance will decrease Outcome: Progressing   Problem: Nutrition: Goal: Adequate nutrition will be maintained Outcome: Progressing   Problem: Coping: Goal: Level of anxiety will decrease Outcome: Progressing   Problem: Elimination: Goal: Will not experience complications related to bowel motility Outcome: Progressing Goal: Will not experience complications related to urinary retention Outcome: Progressing   Problem: Pain Managment: Goal: General experience of comfort will improve Outcome: Progressing   Problem: Safety: Goal: Ability to remain free from injury will improve Outcome: Progressing   Problem: Skin Integrity: Goal: Risk for impaired skin integrity will decrease Outcome: Progressing

## 2021-07-14 NOTE — Progress Notes (Addendum)
PROGRESS NOTE    Sheryl Bender  EKC:[id number] DOB: [birth year] DOA: 07/13/2021 PCP: Sherrilee Gilles, DO   Brief Narrative:    Sheryl Bender is a 76 y.o. female with medical history significant of anxiety, depression, GERD, pulmonary embolism, and recent diagnosis of pancreatic cancer presents to the ED with a chief complaint of abdominal pain.  Patient reports that 4 days ago she had an EGD with dilation of her pancreatic duct and biopsies.  She reports the biopsies came back positive for cancer.  She has now been admitted for postprocedural acute pancreatitis and associated pseudocyst.  Assessment & Plan:   Principal Problem:   Pancreatitis, acute Active Problems:   Hypokalemia   GERD (gastroesophageal reflux disease)   Mood disorder (HCC)   Constipation  Assessment and Plan:   Pancreatitis, acute with pseudocyst - Recent pancreatic duct dilation 4 days ago -CT scan shows a new fluid collection posterior to the gastric antrum and ventral to the pancreas that is most consistent with pancreatic pseudocyst -N.p.o. except for sips and chips -Trend lipase in the a.m. 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   Consultants:   GI  Procedures:  None  Antimicrobials:  None   Subjective: Patient seen and evaluated today with ongoing abdominal pain.  She denies any significant nausea or vomiting.  Objective: Vitals:   07/13/21 2345 07/14/21 0413 07/14/21 0815 07/14/21 0931  BP: 130/71 124/63 (!) 120/59 (!) 108/50  Pulse: 77 84 77 73  Resp: '16 18  18  '$ Temp: 98.5 F (36.9 C) 98.8 F (37.1 C)  98.6 F (37 C)  TempSrc: Oral Oral  Oral  SpO2: 97% 96%  97%  Weight: 58.3 kg     Height: 5' 2.5" (1.588 m)       Intake/Output Summary (Last 24 hours) at 07/14/2021 1246 Last data filed at 07/14/2021 1204 Gross per 24 hour  Intake 1132.64 ml  Output 200 ml  Net 932.64 ml   Filed Weights   07/13/21 2345  Weight: 58.3 kg    Examination:  General exam: Appears calm and comfortable  Respiratory system: Clear to auscultation. Respiratory effort normal. Cardiovascular system: S1 & S2 heard, RRR.  Gastrointestinal system: Abdomen is soft Central nervous system: Alert and awake Extremities: No edema Skin: No significant lesions noted Psychiatry: Flat affect.    Data Reviewed: I have personally reviewed following labs and imaging studies  CBC: Recent Labs  Lab 07/13/21 1318 07/14/21 0434  WBC 10.4 10.2  NEUTROABS 8.1* 8.1*  HGB 12.9 11.4*  HCT 40.3 35.7*  MCV 97.6 97.0  PLT 239 505   Basic Metabolic Panel: Recent Labs  Lab 07/13/21 1318 07/14/21 0434  NA 138 134*  K 3.4* 4.3  CL 105 106  CO2 25 20*  GLUCOSE 114* 88  BUN 16 13  CREATININE 0.88 0.71  CALCIUM 8.7* 8.1*  MG  --  2.2  GFR: Estimated Creatinine Clearance: 47.7 mL/min (by C-G formula based on SCr of 0.71 mg/dL). Liver Function Tests: Recent Labs  Lab 07/13/21 1318 07/14/21 0434  AST 29 32  ALT 25 29  ALKPHOS 88 76  BILITOT 0.3 1.0  PROT 7.0 5.9*  ALBUMIN 3.4* 2.8*   Recent Labs  Lab 07/13/21 1318 07/14/21 0434  LIPASE 313* 355*  AMYLASE 368*  --    No results for input(s): "AMMONIA" in the last 168  hours. Coagulation Profile: No results for input(s): "INR", "PROTIME" in the last 168 hours. Cardiac Enzymes: No results for input(s): "CKTOTAL", "CKMB", "CKMBINDEX", "TROPONINI" in the last 168 hours. BNP (last 3 results) No results for input(s): "PROBNP" in the last 8760 hours. HbA1C: No results for input(s): "HGBA1C" in the last 72 hours. CBG: No results for input(s): "GLUCAP" in the last 168 hours. Lipid Profile: No results for input(s): "CHOL", "HDL", "LDLCALC", "TRIG", "CHOLHDL", "LDLDIRECT" in the last 72 hours. Thyroid Function Tests: No results for input(s): "TSH", "T4TOTAL", "FREET4", "T3FREE", "THYROIDAB" in the last 72 hours. Anemia Panel: No results for input(s): "VITAMINB12", "FOLATE", "FERRITIN", "TIBC", "IRON", "RETICCTPCT" in the last 72 hours. Sepsis Labs: No results for input(s): "PROCALCITON", "LATICACIDVEN" in the last 168 hours.  No results found for this or any previous visit (from the past 240 hour(s)).       Radiology Studies: CT ABDOMEN PELVIS W CONTRAST  Result Date: 07/13/2021 CLINICAL DATA:  Abdominal pain.  Recent upper endoscopy 07/09/2021 EXAM: CT ABDOMEN AND PELVIS WITH CONTRAST TECHNIQUE: Multidetector CT imaging of the abdomen and pelvis was performed using the standard protocol following bolus administration of intravenous contrast. RADIATION DOSE REDUCTION: This exam was performed according to the departmental dose-optimization program which includes automated exposure control, adjustment of the mA and/or kV according to patient size and/or use of iterative reconstruction technique. CONTRAST:  133m OMNIPAQUE IOHEXOL 300 MG/ML  SOLN COMPARISON:  MRI 02/06/2021 FINDINGS: Lower chest: Lung bases are clear. Hepatobiliary: Small cyst in the RIGHT hepatic lobe. No biliary duct dilatation. The gallbladder is collapsed. Common bile duct normal caliber. Pancreas: There is duct dilatation in the mid body and tail of the pancreas. There is abrupt termination of  this duct dilatation in the head of the pancreas (images 18-23 of series 2). These findings are similar to comparison MRI. There is however a new elongated fluid collection extending from just above the junction of the stomach and duodenum continuing inferiorly to the greater curvature of stomach. The collection is just ventral to the head of the pancreas. This collection measures 4.0 x 3.1 cm in axial dimension (image 28/2) and 7.9 cm in craniocaudad dimension (image 23.5). This elongated collection has appearance of the pancreatic pseudocysts. Spleen: Normal spleen Adrenals/urinary tract: Adrenal glands normal. Bilateral parapelvic cysts renal hilum. Ureters and bladder normal. Stomach/Bowel: Stomach is normal. There is compression of the antrum the stomach by the fluid collection described the pancreatic section. Duodenum small-bowel normal. No bowel obstruction. Normal colon. Rectum normal. Vascular/Lymphatic: Abdominal aorta is normal caliber with atherosclerotic calcification. There is no retroperitoneal or periportal lymphadenopathy. No pelvic lymphadenopathy. Reproductive: Uterus normal Other: No free fluid. Musculoskeletal: No aggressive osseous lesion IMPRESSION: Number 1. New fluid collection extending posterior to the gastric antrum and ventral to the pancreas. This elongated collection extends to the greater curvature of the stomach with inflammation surrounding the collection. 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 2. Chronic dilatation of the pancreatic duct with abrupt termination of the duct dilatation in the head  of the pancreas. Findings are not changed from comparison MRI of 02/06/2021. Differential include benign and malignant stricturing of the pancreatic duct. 3. Normal gallbladder.  No biliary duct dilatation Electronically Signed   By: Suzy Bouchard M.D.   On: 07/13/2021 20:42   DG Chest 2 View  Result Date: 07/13/2021 CLINICAL DATA:  Chest pain EXAM: CHEST - 2 VIEW  COMPARISON:  None Available. FINDINGS: Heart size and mediastinal contours are within normal limits. No suspicious pulmonary opacities identified. No pleural effusion or pneumothorax visualized. No acute osseous abnormality appreciated. IMPRESSION: No acute intrathoracic process identified. Electronically Signed   By: Ofilia Neas M.D.   On: 07/13/2021 13:16        Scheduled Meds:  buPROPion  300 mg Oral Daily   busPIRone  20 mg Oral BID   escitalopram  10 mg Oral Daily   escitalopram  20 mg Oral QHS   heparin  5,000 Units Subcutaneous Q8H   metoprolol succinate  25 mg Oral Daily   mirtazapine  30 mg Oral QHS   pantoprazole  40 mg Oral Daily   polyethylene glycol  17 g Oral Daily   QUEtiapine  200 mg Oral QHS   QUEtiapine  25 mg Oral TID WC   Continuous Infusions:  lactated ringers 150 mL/hr at 07/14/21 1021     LOS: 0 days    Time spent: 35 minutes    Yusra Ravert Darleen Crocker, DO [facility]  If 7PM-7AM, please contact night-coverage [email or web address] 07/14/2021, 12:46 PM

## 2021-07-14 NOTE — Consult Note (Addendum)
$'@LOGO'E$ @   Referring Provider: Godfrey Pick, MD Primary Care Physician:  Sherrilee Gilles, DO Primary Gastroenterologist:  Dr. Gala Romney  Date of Admission: 07/13/21 Date of Consultation: 07/14/21  Reason for Consultation:  Acute pancreatitis.   HPI:  Sheryl Bender is a 76 y.o. year old female with history of anxiety, depression, admitted to [facility] in September 2022 with pulmonary embolism and with elevated Lipase of 5767 with pancreatic duct dilation but asymptomatic, subsequent MRI outpatient with chronic pancreatitis, diffuse dilation of pancreatic duct with beaded appearance and pancreatic duct stricture, recently underwent EUS on 07/09/2021 revealing grade B distal esophagitis biopsied, 3 cm hiatal hernia, gastritis biopsied, area of PD stricturing with region of darker appearance identified with no clear hypoechoic mass s/p fine-needle biopsy, pancreatic atrophy and hyperechoic strands in the entire pancreas, no malignant appearing lymph nodes.  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 pylori, esophageal biopsies negative for Barrett's.  Patient presented to the ED on 6/19 with abdominal pain, nausea, left-sided chest pain since having her procedure.  ED Course:  VSS CBC without significant normalities.  CMP with mild hypercalcemia, mild hyperglycemia, mild hypokalemia.  Lipase 313, amylase 368.  Troponins flat. CT A/P with contrast revealing new fluid collection extending posterior to the gastric antrum and ventral to the pancreas extending to the greater curvature of the stomach with inflammation surrounding the collection, suggestive of pancreatic pseudocyst.  Also with chronic dilation of pancreatic duct with abrupt termination of the pancreatic duct in the head of the pancreas, not changed from prior imaging.  ED provider discussed the case with [facility] gastroenterologist on-call, Dr. Loletha Carrow.  Dr. Loletha Carrow reviewed CT imaging and  feels that this is consistent with postprocedural pancreatitis.  Given the reassuring results of her other lab work and her minimal symptoms at this time, he does advise admission here at [facility] for observation, bowel rest, and serial lab work to ensure that patient's clinical condition does not worsen overnight.  He does not feel that any further procedures are indicated.  Patient was admitted to hospitalist for further management.  Today:  Started having 7-8/10 in severity epigastric and LUQ abdominal pain after her procedure that had been persistent aside from when taking Tylenol. Overall about the same since admission. No worsening. Pain is worsened with activity/when straightening up. Was forcing herself to eat regular meals. Not sure if eating was causing any worsening pain. Was able to drink fluids ok. Wants to at least have water now as this is causing her any problems. Denies nausea or vomiting, but was having a lot of acid reflux after the procedure. Started taking Nexium over the counter but didn't notice much improvement has she had only taken it for a few days. Reports having a little reflux today.   Mentally feels worse since being here. She is very stressed about her recent diagnosis of pancreatic cancer. States she thinks she was having a panic attack this morning. Not getting much sleep and feels this is worsening things. Reports she used to see a Marketing executive in [place], but hasn't seen her in a while. Not thinking about hurting herself.   Not having BRBPR or melena. Has been dealing with constipation and eats cashews to help with this. Bowels usually move every other day. Last BM was Saturday or Sunday. She is passing a little gas.   No longer taking Eliquis. Stopped after 60 days.   Past Medical History:  Diagnosis Date   Anxiety  Arthritis    Depression    Dysrhythmia    hx palpitations greater than 5 yrs -neg echo, stress per pt ? where or dr   GERD  (gastroesophageal reflux disease)    occ   Pulmonary embolism ([place])    September 2022    Past Surgical History:  Procedure Laterality Date   ANTERIOR LAT LUMBAR FUSION Right 12/16/2015   Procedure: RIGHT LUMBAR TWO-THREE, LUMBAR THREE-FOUR, LUMBAR FOUR-FIVE ANTEROLATERAL LUMBAR INTERBODY FUSION;  Surgeon: Erline Levine, MD;  Location: [place];  Service: Neurosurgery;  Laterality: Right;   BIOPSY  07/09/2021   Procedure: BIOPSY;  Surgeon: Rush Landmark Telford Nab., MD;  Location: [place];  Service: Gastroenterology;;   ESOPHAGOGASTRODUODENOSCOPY N/A 07/09/2021   Procedure: ESOPHAGOGASTRODUODENOSCOPY (EGD);  Surgeon: Irving Copas., MD;  Location: [place];  Service: Gastroenterology;  Laterality: N/A;   EUS N/A 07/09/2021   Procedure: UPPER ENDOSCOPIC ULTRASOUND (EUS) RADIAL;  Surgeon: Irving Copas., MD;  Location: [place];  Service: Gastroenterology;  Laterality: N/A;   FINE NEEDLE ASPIRATION  07/09/2021   Procedure: FINE NEEDLE ASPIRATION (FNA) LINEAR;  Surgeon: Irving Copas., MD;  Location: [place];  Service: Gastroenterology;;   LUMBAR PERCUTANEOUS PEDICLE SCREW 3 LEVEL Bilateral 12/16/2015   Procedure: PERCUTANEOUS PEDICLE SCREWS BILATERALLY AT LUMBAR TWO-FIVE;  Surgeon: Erline Levine, MD;  Location: [place];  Service: Neurosurgery;  Laterality: Bilateral;   TUBAL LIGATION  1974    Prior to Admission medications   Medication Sig Start Date End Date Taking? Authorizing Provider  buPROPion (WELLBUTRIN XL) 300 MG 24 hr tablet Take 300 mg by mouth daily. 06/15/21  Yes [provider]  busPIRone (BUSPAR) 10 MG tablet Take 20 mg by mouth 2 (two) times daily. 01/27/21  Yes [provider]  Cholecalciferol (VITAMIN D) 125 MCG (5000 UT) CAPS Take 5,000 Units by mouth every other day.   Yes [provider]  Cholecalciferol (VITAMIN D3) 10 MCG (400 UNIT) tablet Take by mouth. 04/20/21  Yes [provider]  escitalopram  (LEXAPRO) 20 MG tablet Take 30 mg by mouth See admin instructions. Take 1/2 tablet in the morning and 1 tablet at 830pm   Yes [provider]  esomeprazole (NEXIUM) 40 MG capsule Take 1 capsule (40 mg total) by mouth daily. Patient taking differently: Take 20 mg by mouth daily. 07/09/21 07/09/22 Yes Mansouraty, Telford Nab., MD  metoprolol succinate (TOPROL-XL) 25 MG 24 hr tablet Take 25 mg by mouth daily.   Yes [provider]  mirtazapine (REMERON) 30 MG tablet Take 30 mg by mouth at bedtime. 06/15/21  Yes [provider]  QUEtiapine (SEROQUEL) 200 MG tablet Take 200 mg by mouth at bedtime. 03/27/21  Yes [provider]  QUEtiapine (SEROQUEL) 50 MG tablet Take 25 mg by mouth 3 (three) times daily. 04/20/21  Yes [provider]  acetaminophen (TYLENOL) 500 MG tablet Take 1,000 mg by mouth every 8 (eight) hours as needed for moderate pain.    [provider]    Current Facility-Administered Medications  Medication Dose Route Frequency Provider Last Rate Last Admin   acetaminophen (TYLENOL) tablet 650 mg  650 mg Oral Q6H PRN Zierle-Ghosh, Asia B, DO   650 mg at 07/14/21 3016   Or   acetaminophen (TYLENOL) suppository 650 mg  650 mg Rectal Q6H PRN Zierle-Ghosh, Asia B, DO       buPROPion (WELLBUTRIN XL) 24 hr tablet 300 mg  300 mg Oral Daily Zierle-Ghosh, Asia B, DO   300 mg at 07/14/21  7253   busPIRone (BUSPAR) tablet 20 mg  20 mg Oral BID Zierle-Ghosh, Asia B, DO   20 mg at 07/14/21 0816   escitalopram (LEXAPRO) tablet 10 mg  10 mg Oral Daily Zierle-Ghosh, Asia B, DO   10 mg at 07/14/21 0815   escitalopram (LEXAPRO) tablet 20 mg  20 mg Oral QHS Zierle-Ghosh, Asia B, DO       fentaNYL (SUBLIMAZE) injection 25 mcg  25 mcg Intravenous Q1H PRN Zierle-Ghosh, Asia B, DO       heparin injection 5,000 Units  5,000 Units Subcutaneous Q8H Zierle-Ghosh, Asia B, DO   5,000 Units at 07/14/21 0002   lactated ringers infusion   Intravenous Continuous Erenest Rasher, PA-C 150 mL/hr at 07/14/21 1021 New Bag at 07/14/21 1021   metoprolol succinate (TOPROL-XL) 24 hr tablet 25 mg  25 mg Oral Daily Zierle-Ghosh, Asia B, DO   25 mg at 07/14/21 0815   mirtazapine (REMERON) tablet 30 mg  30 mg Oral QHS Zierle-Ghosh, Asia B, DO       morphine (PF) 2 MG/ML injection 2 mg  2 mg Intravenous Q2H PRN Zierle-Ghosh, Asia B, DO       ondansetron (ZOFRAN) tablet 4 mg  4 mg Oral Q6H PRN Zierle-Ghosh, Asia B, DO   4 mg at 07/14/21 6644   Or   ondansetron (ZOFRAN) injection 4 mg  4 mg Intravenous Q6H PRN Zierle-Ghosh, Asia B, DO   4 mg at 07/13/21 2358   oxyCODONE (Oxy IR/ROXICODONE) immediate release tablet 5 mg  5 mg Oral Q4H PRN Zierle-Ghosh, Asia B, DO       pantoprazole (PROTONIX) EC tablet 40 mg  40 mg Oral Daily Zierle-Ghosh, Asia B, DO       polyethylene glycol (MIRALAX / GLYCOLAX) packet 17 g  17 g Oral Daily Whittaker Lenis S, PA-C       QUEtiapine (SEROQUEL) tablet 200 mg  200 mg Oral QHS Zierle-Ghosh, Asia B, DO       QUEtiapine (SEROQUEL) tablet 25 mg  25 mg Oral TID WC Zierle-Ghosh, Asia B, DO   25 mg at 07/14/21 0817    Allergies as of 07/13/2021 - Review Complete 07/13/2021  Allergen Reaction Noted   Other Hives 12/08/2015   Cherry Hives 03/30/2021    Family History  Problem Relation Age of Onset   Pancreatitis Brother        alcoholic pancreatitis   Pancreatic cancer Neg Hx    Colon cancer Neg Hx     Social History   Socioeconomic History   Marital status: Widowed    Spouse name: Not on file   Number of children: Not on file   Years of education: Not on file   Highest education level: Not on file  Occupational History   Not on file  Tobacco Use   Smoking status: Never   Smokeless tobacco: Never  Substance and Sexual Activity   Alcohol use: No   Drug use: Never   Sexual activity: Not on file  Other Topics Concern   Not on file  Social History Narrative   Not on file   Social Determinants of Health   Financial Resource  Strain: Not on file  Food Insecurity: Not on file  Transportation Needs: Not on file  Physical Activity: Not on file  Stress: Not on file  Social Connections: Not on file  Intimate Partner Violence: Not on file    Review of Systems: Gen: Denies fever, chills, pre-syncope, or syncope.  CV: Denies chest pain, heart palpitations. Resp: Denies shortness of breath or cough.  GI: See HPI GU : Denies urinary burning, urinary frequency, urinary incontinence.  MS: Denies joint pain. Derm: Denies rash. Psych: Admits to anxiety and depression. No SI.  Heme: See HPI  Physical Exam: Vital signs in last 24 hours: Temp:  [97.7 F (36.5 C)-98.8 F (37.1 C)] 98.6 F (37 C) (06/20 0931) Pulse Rate:  [69-93] 73 (06/20 0931) Resp:  [15-18] 18 (06/20 0931) BP: (108-157)/(50-88) 108/50 (06/20 0931) SpO2:  [91 %-100 %] 97 % (06/20 0931) FiO2 (%):  [21 %] 21 % (06/19 2321) Weight:  [58.3 kg] 58.3 kg (06/19 2345) Last BM Date : 07/12/21 General:   Alert,  Well-developed, well-nourished, pleasant and cooperative in NAD Head:  Normocephalic and atraumatic. Eyes:  Sclera clear, no icterus.   Conjunctiva pink. Ears:  Normal auditory acuity. Lungs:  Clear throughout to auscultation.   No wheezes, crackles, or rhonchi. No acute distress. Heart:  Regular rate and rhythm; no murmurs, clicks, rubs,  or gallops. Abdomen:  Soft and nondistended. Moderate TTP in epigastric area, very mild TTP across the lower abdomen. No masses, hepatosplenomegaly or hernias noted. Normal bowel sounds, without guarding, and without rebound.   Rectal:  Deferred   Msk:  Symmetrical without gross deformities. Normal posture. Extremities:  Without edema. Neurologic:  Alert and  oriented x4;  grossly normal neurologically. Skin:  Intact without significant lesions or rashes. Psych:  Normal mood and affect.  Intake/Output from previous day: 06/19 0701 - 06/20 0700 In: 1132.6 [I.V.:946.8; IV Piggyback:185.8] Out: -   Intake/Output this shift: No intake/output data recorded.  Lab Results: Recent Labs    07/13/21 1318 07/14/21 0434  WBC 10.4 10.2  HGB 12.9 11.4*  HCT 40.3 35.7*  PLT 239 223   BMET Recent Labs    07/13/21 1318 07/14/21 0434  NA 138 134*  K 3.4* 4.3  CL 105 106  CO2 25 20*  GLUCOSE 114* 88  BUN 16 13  CREATININE 0.88 0.71  CALCIUM 8.7* 8.1*   LFT Recent Labs    07/13/21 1318 07/14/21 0434  PROT 7.0 5.9*  ALBUMIN 3.4* 2.8*  AST 29 32  ALT 25 29  ALKPHOS 88 76  BILITOT 0.3 1.0    Studies/Results: CT ABDOMEN PELVIS W CONTRAST  Result Date: 07/13/2021 CLINICAL DATA:  Abdominal pain.  Recent upper endoscopy 07/09/2021 EXAM: CT ABDOMEN AND PELVIS WITH CONTRAST TECHNIQUE: Multidetector CT imaging of the abdomen and pelvis was performed using the standard protocol following bolus administration of intravenous contrast. RADIATION DOSE REDUCTION: This exam was performed according to the departmental dose-optimization program which includes automated exposure control, adjustment of the mA and/or kV according to patient size and/or use of iterative reconstruction technique. CONTRAST:  159m OMNIPAQUE IOHEXOL 300 MG/ML  SOLN COMPARISON:  MRI 02/06/2021 FINDINGS: Lower chest: Lung bases are clear. Hepatobiliary: Small cyst in the RIGHT hepatic lobe. No biliary duct dilatation. The gallbladder is collapsed. Common bile duct normal caliber. Pancreas: There is duct dilatation in the mid body and tail of the pancreas. There is abrupt termination of this duct dilatation in the head of the pancreas (images 18-23 of series 2). These findings are similar to comparison MRI. There is however a new elongated fluid collection extending from just above the junction of the stomach and duodenum continuing inferiorly to the greater curvature of stomach. The collection is just ventral to the head of the pancreas. This collection measures 4.0 x 3.1 cm  in axial dimension (image 28/2) and 7.9 cm in  craniocaudad dimension (image 23.5). This elongated collection has appearance of the pancreatic pseudocysts. Spleen: Normal spleen Adrenals/urinary tract: Adrenal glands normal. Bilateral parapelvic cysts renal hilum. Ureters and bladder normal. Stomach/Bowel: Stomach is normal. There is compression of the antrum the stomach by the fluid collection described the pancreatic section. Duodenum small-bowel normal. No bowel obstruction. Normal colon. Rectum normal. Vascular/Lymphatic: Abdominal aorta is normal caliber with atherosclerotic calcification. There is no retroperitoneal or periportal lymphadenopathy. No pelvic lymphadenopathy. Reproductive: Uterus normal Other: No free fluid. Musculoskeletal: No aggressive osseous lesion IMPRESSION: Number 1. New fluid collection extending posterior to the gastric antrum and ventral to the pancreas. This elongated collection extends to the greater curvature of the stomach with inflammation surrounding the collection. 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 2. Chronic dilatation of the pancreatic duct with abrupt termination of the duct dilatation in the head of the pancreas. Findings are not changed from comparison MRI of 02/06/2021. Differential include benign and malignant stricturing of the pancreatic duct. 3. Normal gallbladder.  No biliary duct dilatation Electronically Signed   By: Suzy Bouchard M.D.   On: 07/13/2021 20:42   DG Chest 2 View  Result Date: 07/13/2021 CLINICAL DATA:  Chest pain EXAM: CHEST - 2 VIEW COMPARISON:  None Available. FINDINGS: Heart size and mediastinal contours are within normal limits. No suspicious pulmonary opacities identified. No pleural effusion or pneumothorax visualized. No acute osseous abnormality appreciated. IMPRESSION: No acute intrathoracic process identified. Electronically Signed   By: Ofilia Neas M.D.   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 pylori, who presented to the emergency room on 6/19 with abdominal pain, nausea, left-sided chest pain started after EUS, found to have elevated lipase and amylase, CT with new fluid collection ventral to the pancreas extending posterior to the gastric antrum into the greater curvature of the stomach with inflammation surrounding the collection suggestive of pancreatic pseudocyst.  She was admitted with acute pancreatitis, GI consulted for further management.  Acute pancreatitis with pseudocyst:  Likely postprocedural pancreatitis following EUS on 07/09/21. 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 Continues with 7-8/10 epigastric/LUQ abdominal pain, about the same since admission, but denies nausea or vomiting and is asking to at least have clear liquids. On exam, she had moderate TTP in epigastric area and very mild TTP across the lower abdomen, no abdominal distension, rebound or guarding. Lower abdominal pain may be referred pain related to pancreatic inflammation, but may also be influenced by constipation as she hasn't had a BM since Saturday or Sunday, but is passing some gas. I will start her on MiraLAX daily, keep her NPO aside from sips of water, change IV fluids to LR 150 cc/hr for the next 24 hours, and reassess in the morning. Of note, it is possible that her current picture could be related to pancreatic duct leak following recent EUS with biopsy of pancreatic duct stricture. If she doesn't improve or begins to clinically worsen, will need to re-image.   Constipation:  Start  MiraLAX 17g daily.   GERD:  Reports reflux since having EUS though she was found to have grade B distal esophagitis on EUS. Had not been on PPI prior to EUS. Started Nexium 20 mg daily after EUS without improvement. Currently on Protonix 40 mg daily with improvement. Reports very little reflux today.   Pancreatic cancer:  EUS 07/09/2021 with Dr. Rush Landmark s/p fine-needle biopsy of PD stricturing with region of darker appearance identified, pathology consistent with adenocarcinoma.  Staging exams have been ordered by Dr. Rush Landmark and referral has been placed to oncology. At this point, as she has already had CT A/P this admission, she will only need CT chest which can be completed outpatient.   Anxiety/Depression:  Chronic with some worsening since pancreatic cancer diagnosis. No SI. Reports she used to see a Marketing executive in [place], but has not seen her in a while.  States she will reach out to her after her hospital discharge.  Will defer any additional management/evaluation to hospitalist as indicated.     Plan: Change IV fluids to LR 150 cc/hr for 24 hrs and reassess.  Keep NPO aside from sips of water.  Start MiraLAX 17g daily. Encouraged patient to change positions in bed every 1-2 hours.  Continue Protonix 40 mg daily.  We will reassess in the morning.  If she doesn't improve or begins to worsen, she will need re-imaging of her abdomen. She will need outpatient CT chest for staging purposes due to recent pancreatic cancer diagnosis. Outpatient evaluation with oncology for pancreatic cancer.  Referral was placed on 6/19 by Dr. Rush Landmark.  Management of anxiety/depression per hospitalist.    LOS: 0 days    07/14/2021, 10:33 AM   Aliene Altes, PA-C [facility]

## 2021-07-14 NOTE — Progress Notes (Signed)
Went in to apply DNR band. Pt educated regarding band meaning. Pt states she didn't want to be DNR. Pt would like to be FC until speaking with DR. On call DR. Adefeso made aware.

## 2021-07-15 DIAGNOSIS — K858 Other acute pancreatitis without necrosis or infection: Secondary | ICD-10-CM | POA: Diagnosis not present

## 2021-07-15 DIAGNOSIS — C250 Malignant neoplasm of head of pancreas: Secondary | ICD-10-CM

## 2021-07-15 DIAGNOSIS — D509 Iron deficiency anemia, unspecified: Secondary | ICD-10-CM | POA: Diagnosis present

## 2021-07-15 DIAGNOSIS — K861 Other chronic pancreatitis: Secondary | ICD-10-CM

## 2021-07-15 DIAGNOSIS — I10 Essential (primary) hypertension: Secondary | ICD-10-CM | POA: Diagnosis present

## 2021-07-15 LAB — COMPREHENSIVE METABOLIC PANEL
ALT: 32 U/L (ref 0–44)
AST: 24 U/L (ref 15–41)
Albumin: 2.4 g/dL — ABNORMAL LOW (ref 3.5–5.0)
Alkaline Phosphatase: 74 U/L (ref 38–126)
Anion gap: 4 — ABNORMAL LOW (ref 5–15)
BUN: 6 mg/dL — ABNORMAL LOW (ref 8–23)
CO2: 25 mmol/L (ref 22–32)
Calcium: 8.2 mg/dL — ABNORMAL LOW (ref 8.9–10.3)
Chloride: 111 mmol/L (ref 98–111)
Creatinine, Ser: 0.68 mg/dL (ref 0.44–1.00)
GFR, Estimated: >60 mL/min (ref >60)
Glucose, Bld: 176 mg/dL — ABNORMAL HIGH (ref 70–99)
Potassium: 3.4 mmol/L — ABNORMAL LOW (ref 3.5–5.1)
Sodium: 140 mmol/L (ref 135–145)
Total Bilirubin: 0.3 mg/dL (ref 0.3–1.2)
Total Protein: 5.4 g/dL — ABNORMAL LOW (ref 6.5–8.1)

## 2021-07-15 LAB — CBC
HCT: 31.9 % — ABNORMAL LOW (ref 36.0–46.0)
Hemoglobin: 10.4 g/dL — ABNORMAL LOW (ref 12.0–15.0)
MCH: 31.5 pg (ref 26.0–34.0)
MCHC: 32.6 g/dL (ref 30.0–36.0)
MCV: 96.7 fL (ref 80.0–100.0)
Platelets: 200 10*3/uL (ref 150–400)
RBC: 3.3 MIL/uL — ABNORMAL LOW (ref 3.87–5.11)
RDW: 13.6 % (ref 11.5–15.5)
WBC: 8.3 10*3/uL (ref 4.0–10.5)
nRBC: 0 % (ref 0.0–0.2)

## 2021-07-15 LAB — LIPASE, BLOOD: Lipase: 282 U/L — ABNORMAL HIGH (ref 11–51)

## 2021-07-15 LAB — MAGNESIUM: Magnesium: 1.9 mg/dL (ref 1.7–2.4)

## 2021-07-15 MED ORDER — HYDROMORPHONE HCL 1 MG/ML IJ SOLN: 1.0000 mg | INTRAMUSCULAR | Status: DC | PRN

## 2021-07-15 MED ORDER — ENOXAPARIN SODIUM 40 MG/0.4ML IJ SOSY
40.0000 mg | PREFILLED_SYRINGE | INTRAMUSCULAR | Status: DC
  Administered 2021-07-16 – 2021-07-20 (×5): 40 mg via SUBCUTANEOUS
  Filled 2021-07-15 (×5): qty 0.4

## 2021-07-15 MED ORDER — POTASSIUM CHLORIDE CRYS ER 20 MEQ PO TBCR
40.0000 meq | EXTENDED_RELEASE_TABLET | ORAL | Status: AC
  Administered 2021-07-15 (×2): 40 meq via ORAL
  Filled 2021-07-15 (×2): qty 2

## 2021-07-15 MED ORDER — PANTOPRAZOLE SODIUM 40 MG PO TBEC
40.0000 mg | DELAYED_RELEASE_TABLET | Freq: Two times a day (BID) | ORAL | Status: DC
  Administered 2021-07-15 – 2021-07-21 (×12): 40 mg via ORAL
  Filled 2021-07-15 (×12): qty 1

## 2021-07-15 NOTE — Assessment & Plan Note (Signed)
Continue metoprolol for blood pressure control 

## 2021-07-15 NOTE — Progress Notes (Signed)
Subjective: States she is feeling very tired today. She had in epigastric area. She took some tylenol which helped some. Her mouth feels very dry. She has no nausea or vomiting. Feels that pain may have improved some since admission, though tends to worsen when she gets up and moves around. She has not had a BM today, last was maybe 'Sunday. Still attempting to finish dose of miralax. No GERD symptoms this morning but notes more acid regurgitation at night which sometimes induces coughing.   Objective: Vital signs in last 24 hours: Temp:  [98.1 F (36.7 C)-99.1 F (37.3 C)] 99.1 F (37.3 C) (06/21 0449) Pulse Rate:  [71-83] 83 (06/21 0449) Resp:  [16-18] 16 (06/21 0449) BP: (108-138)/(50-75) 116/56 (06/21 0449) SpO2:  [95 %-97 %] 95 % (06/21 0449) Last BM Date : 07/12/21 General:   Alert and oriented, pleasant Head:  Normocephalic and atraumatic. Eyes:  No icterus, sclera clear. Conjuctiva pink.  Mouth:  Without lesions, mucosa pink and moist.  Heart:  S1, S2 present, no murmurs noted.  Lungs: Clear to auscultation bilaterally, without wheezing, rales, or rhonchi.  Abdomen:  Bowel sounds present, soft, very mild distention. TTP of epigastric and Left sided abdomen No HSM or hernias noted. No rebound or guarding. No masses appreciated  Msk:  Symmetrical without gross deformities. Normal posture. Pulses:  Normal pulses noted. Extremities:  Without clubbing or edema. Neurologic:  Alert and  oriented x4;  grossly normal neurologically. Skin:  Warm and dry, intact without significant lesions.  Psych:  Alert and cooperative. Normal mood and affect.  Intake/Output from previous day: 06/20 0701 - 06/21 0700 In: 751.7 [I.V.:751.7] Out: 500 [Urine:500] Intake/Output this shift: No intake/output data recorded.  Lab Results: Recent Labs    07/13/21 1318 07/14/21 0434 07/15/21 0419  WBC 10.4 10.2 8.3  HGB 12.9 11.4* 10.4*  HCT 40.3 35.7* 31.9*  PLT 239 223 200   BMET Recent Labs     06'$ /19/23 1318 07/14/21 0434 07/15/21 0419  NA 138 134* 140  K 3.4* 4.3 3.4*  CL 105 106 111  CO2 25 20* 25  GLUCOSE 114* 88 176*  BUN 16 13 6*  CREATININE 0.88 0.71 0.68  CALCIUM 8.7* 8.1* 8.2*   LFT Recent Labs    07/13/21 1318 07/14/21 0434 07/15/21 0419  PROT 7.0 5.9* 5.4*  ALBUMIN 3.4* 2.8* 2.4*  AST 29 32 24  ALT 25 29 32  ALKPHOS 88 76 74  BILITOT 0.3 1.0 0.3   Studies/Results: CT ABDOMEN PELVIS W CONTRAST  Result Date: 07/13/2021 CLINICAL DATA:  Abdominal pain.  Recent upper endoscopy 07/09/2021 EXAM: CT ABDOMEN AND PELVIS WITH CONTRAST TECHNIQUE: Multidetector CT imaging of the abdomen and pelvis was performed using the standard protocol following bolus administration of intravenous contrast. RADIATION DOSE REDUCTION: This exam was performed according to the departmental dose-optimization program which includes automated exposure control, adjustment of the mA and/or kV according to patient size and/or use of iterative reconstruction technique. CONTRAST:  130m OMNIPAQUE IOHEXOL 300 MG/ML  SOLN COMPARISON:  MRI 02/06/2021 FINDINGS: Lower chest: Lung bases are clear. Hepatobiliary: Small cyst in the RIGHT hepatic lobe. No biliary duct dilatation. The gallbladder is collapsed. Common bile duct normal caliber. Pancreas: There is duct dilatation in the mid body and tail of the pancreas. There is abrupt termination of this duct dilatation in the head of the pancreas (images 18-23 of series 2). These findings are similar to comparison MRI. There is however a new elongated fluid collection extending from  just above the junction of the stomach and duodenum continuing inferiorly to the greater curvature of stomach. The collection is just ventral to the head of the pancreas. This collection measures 4.0 x 3.1 cm in axial dimension (image 28/2) and 7.9 cm in craniocaudad dimension (image 23.5). This elongated collection has appearance of the pancreatic pseudocysts. Spleen: Normal spleen  Adrenals/urinary tract: Adrenal glands normal. Bilateral parapelvic cysts renal hilum. Ureters and bladder normal. Stomach/Bowel: Stomach is normal. There is compression of the antrum the stomach by the fluid collection described the pancreatic section. Duodenum small-bowel normal. No bowel obstruction. Normal colon. Rectum normal. Vascular/Lymphatic: Abdominal aorta is normal caliber with atherosclerotic calcification. There is no retroperitoneal or periportal lymphadenopathy. No pelvic lymphadenopathy. Reproductive: Uterus normal Other: No free fluid. Musculoskeletal: No aggressive osseous lesion IMPRESSION: Number 1. New fluid collection extending posterior to the gastric antrum and ventral to the pancreas. This elongated collection extends to the greater curvature of the stomach with inflammation surrounding the collection. 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 2. Chronic dilatation of the pancreatic duct with abrupt termination of the duct dilatation in the head of the pancreas. Findings are not changed from comparison MRI of 02/06/2021. Differential include benign and malignant stricturing of the pancreatic duct. 3. Normal gallbladder.  No biliary duct dilatation Electronically Signed   By: Suzy Bouchard M.D.   On: 07/13/2021 20:42   DG Chest 2 View  Result Date: 07/13/2021 CLINICAL DATA:  Chest pain EXAM: CHEST - 2 VIEW COMPARISON:  None Available. FINDINGS: Heart size and mediastinal contours are within normal limits. No suspicious pulmonary opacities identified. No pleural effusion or pneumothorax visualized. No acute osseous abnormality appreciated. IMPRESSION: No acute intrathoracic process identified. Electronically Signed   By: Ofilia Neas M.D.   On: 07/13/2021 13:16    Assessment: Sheryl Bender is a 76 year old female with past medical history of anxiety, depression, recent diagnosis of pancreatic cancer, pulmonary embolism, who came to the hospital after presenting  with new onset of abdominal pain after undergoing endoscopic ultrasound.   Acute Pancreatitis with pseudocyst: upper abdominal pain that started after recent EUS as well as malaise. CT of the abdomen and pelvis with IV contrast showed presence of a new fluid collection extending posterior to the gastric antrum and ventral to the pancreas with inflammation surrounding the collection which was suggestive of possible pseudocyst versus leak.  There was also presence of chronic dilatation of the pancreatic duct with abrupt termination in the head of the pancreas. Suspected acute pancreatitis etiology as postprocedural complication which could be related to iatrogenic pancreatitis versus pancreatic ductal leak, though ductal leak less likely. Dr. Jenetta Downer discussed case with  Dr. Rush Landmark who performed her procedure and he agreed with conservative management for acute pancreatitis at this time, though, if worsening pain or decline in clinical presentation, will need re imaging and consideration of transfer to [place] [facility] for possible pancreatic duct stenting at that time.    Lipase 282, down from 355 yesterday. WBC and LFTs remains WNL. Patient feels that pain may be slightly improved since admission, though still with epigastric to Left abdominal pain with movement. Denies nausea. Tylenol has helped some with her pain. recommendations to transition to clear liquid diet yesterday, however, she remains with NPO order. Will place clear liquid diet orders. Continued pain management per hospitalist.   Pancreatic Cancer: EUS 07/09/21 with Dr. 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  Staging exams  have been ordered by Dr. Rush Landmark and referral has been placed to oncology. has already had CT A/P this admission, she will only need CT chest which can be completed outpatient.   Constipation: no BM since Sunday,  Miralax 17g started yesterday. She reports that she started having constipation a few weeks ago with pebble like stools, previously having larger, soft but formed stools, she started miralax at home but then added cashews which seemed to help move her bowels. She is still working on dose of miralax she was given this morning, reports she has not had much food intake recently, last food was probably on Sunday when she ate some toast. Consider increasing to BID dosing tomorrow if no BM.   GERD with esophagitis: reflux since EUS with grade B distal esophagitis found at that time, no PPI prior to EUS. Started on Nexium '20mg'$  thereafter, she tells me she could not get Rx nexium as her insurance would not cover it so had to get it OTC. Currently on protonix '40mg'$  daily.  Feels that symptoms have been okay so far today, but usually flare up during the end of the day. She states symptoms were really bad Sunday night with acid regurgitation which caused a lot of coughing and she was concerned she may have aspirated some.    Plan: Increase Protonix '40mg'$  to BID dosing Continue miralax, increase to BID if no BM by tomorrow  Outpatient CT chest for staging purposes due to recent pancreatic cancer diagnosis Oncology evaluation on outpatient basis Will order clear liquid diet, can slowly advance as tolerated Continue to follow for worsening pain/clinical decline as she will need re imaging at that time Pain management per hospitalist   LOS: 1 day    07/15/2021, 9:23 AM   Sheryl Bender L. Alver Sorrow, MSN, APRN, AGNP-C Adult-Gerontology Nurse Practitioner [facility]

## 2021-07-15 NOTE — Hospital Course (Addendum)
Sheryl Bender was admitted to the hospital with the working diagnosis of pancreatitis.  76 yo female with the past medical history of depression, pulmonary embolism and recent diagnosis of pancreatic cancer who presented with abdominal pain. 4 days prior to hospitalization patient had EGD with dilatation of pancreatic duct, biopsy resulted positive for pancreatic cancer. Patient reported abdominal pain post procedure, that remained constant over the following days, prompting her to come to the hospital. On her initial physical examination her blood pressure was 120/63, HR 69, RR 16 and 02 saturation 94%, lungs clear to auscultation, heart with S1 and S2 present and rhythmic, abdomen tender to palpation in the right upper quadrant, and left upper quadrant with no rebound, no lower extremity edema.   CT abdomen and pelvis with new fluid collection extending to the gastric antrum and ventral pancreas. Chronic dilatation of the pancreatic duct with abrupt termination of the duct dilatation in the head of the pancreas. Normal gallbladder.   Patient was placed on IV fluids and NPO, IV analgesics and as needed antiemetics.   GI was consulted.   06/21 diet advanced to clears.  06/22 improving symptoms, advanced to full liquids and getting CT chest for cancer staging.

## 2021-07-15 NOTE — Progress Notes (Signed)
Progress Note   [name] NWG:[id number] DOB: 28-Nov-1945 DOA: 07/13/2021     1 DOS: the patient was seen and examined on 07/15/2021   Brief hospital course: Mrs. Dorwart was admitted to the hospital with the working diagnosis of pancreatitis.  76 yo female with the past medical history of depression, pulmonary embolism and recent diagnosis of pancreatic cancer who presented with abdominal pain. 4 days prior to hospitalization patient had EGD with dilatation of pancreatic duct, biopsy resulted positive for pancreatic cancer. Patient reported abdominal pain post procedure, that remained constant over the following days, prompting her to come to the hospital. On her initial physical examination her blood pressure was 120/63, HR 69, RR 16 and 02 saturation 94%, lungs clear to auscultation, heart with S1 and S2 present and rhythmic, abdomen tender to palpation in the right upper quadrant, and left upper quadrant with no rebound, no lower extremity edema.   CT abdomen and pelvis with new fluid collection extending to the gastric antrum and ventral pancreas. Chronic dilatation of the pancreatic duct with abrupt termination of the duct dilatation in the head of the pancreas. Normal gallbladder.   Patient was placed on IV fluids and NPO, IV analgesics and as needed antiemetics.   GI was consulted.   06/21 diet advanced to clears.     Assessment and Plan: * Pancreatitis, acute Post procedure pancreatitis. Pancreatic cancer.   Patient with improvement in her abdominal pain but not yet back to baseline. Today will advance to clears per GI recommendations.  Plan to continue supportive medical therapy with IV fluids, as needed analgesics and antiemetics.  Plan for CT chest for cancer staging in am.  Out of bed to chair tid with meals.     Hypokalemia Renal function with serum cr at 0,68 K is 3,4 and serum bicarbonate at 25,  Plan to continue K correction with oral Kcl and follow up  renal function in am, Avoid hypotension and nephrotoxic medications.   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  Plan to add IV iron during this hospitalization,         Subjective: Patient continue to have abdominal pain, mild improvement but continue to be moderate to severe in intensity   Physical Exam: Vitals:   07/14/21 1545 07/14/21 2129 07/15/21 0449 07/15/21 1436  BP: 134/75 138/65 (!) 116/56 (!) 115/51  Pulse: 71 72 83 71  Resp: '18 18 16 19  '$ Temp: 98.1 F (36.7 C) 98.6 F (37 C) 99.1 F (37.3 C) 97.9 F (36.6 C)  TempSrc:  Oral Oral Oral  SpO2: 95% 96% 95% 98%  Weight:      Height:       Neurology awake and alert ENT with mild pallor Cardiovascular with S1 and S2 present and rhythmic Respiratory with no rales or wheezing Abdomen tender to deep palpation with no rebound, no distention  No lower extremity edema  Data Reviewed:    Family Communication: no family at the bedside. I spoke over the phone with the patient's daughter in law about patient's  condition, plan of care, prognosis and all questions were addressed.   Disposition: Status is: Inpatient Remains inpatient appropriate because: pancreatitis   Planned Discharge Destination: Home    Author: Tawni Millers,  MD 07/15/2021 3:49 PM  For on call review [email or web address].

## 2021-07-15 NOTE — Care Management Important Message (Signed)
Important Message  Patient Details  Name: Sheryl Bender MRN: [id number] Date of Birth: [birth year]   Medicare Important Message Given:  N/A - LOS <3 / Initial given by admissions     Tommy Medal 07/15/2021, 11:39 AM

## 2021-07-15 NOTE — Assessment & Plan Note (Addendum)
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  Continue with IV iron 2 doses.

## 2021-07-16 ENCOUNTER — Inpatient Hospital Stay (HOSPITAL_COMMUNITY): Payer: Medicare Other

## 2021-07-16 DIAGNOSIS — I10 Essential (primary) hypertension: Secondary | ICD-10-CM | POA: Diagnosis not present

## 2021-07-16 DIAGNOSIS — K2100 Gastro-esophageal reflux disease with esophagitis, without bleeding: Secondary | ICD-10-CM

## 2021-07-16 DIAGNOSIS — K858 Other acute pancreatitis without necrosis or infection: Secondary | ICD-10-CM | POA: Diagnosis not present

## 2021-07-16 DIAGNOSIS — D509 Iron deficiency anemia, unspecified: Secondary | ICD-10-CM

## 2021-07-16 LAB — BASIC METABOLIC PANEL
Anion gap: 5 (ref 5–15)
BUN: 5 mg/dL — ABNORMAL LOW (ref 8–23)
CO2: 26 mmol/L (ref 22–32)
Calcium: 8.6 mg/dL — ABNORMAL LOW (ref 8.9–10.3)
Chloride: 110 mmol/L (ref 98–111)
Creatinine, Ser: 0.76 mg/dL (ref 0.44–1.00)
GFR, Estimated: >60 mL/min (ref >60)
Glucose, Bld: 85 mg/dL (ref 70–99)
Potassium: 4.4 mmol/L (ref 3.5–5.1)
Sodium: 141 mmol/L (ref 135–145)

## 2021-07-16 MED ORDER — ALUM & MAG HYDROXIDE-SIMETH 200-200-20 MG/5ML PO SUSP
30.0000 mL | Freq: Four times a day (QID) | ORAL | Status: DC | PRN
  Administered 2021-07-16: 30 mL via ORAL
  Filled 2021-07-16: qty 30

## 2021-07-16 MED ORDER — LINACLOTIDE 72 MCG PO CAPS
72.0000 ug | ORAL_CAPSULE | Freq: Every day | ORAL | Status: DC
  Administered 2021-07-16 – 2021-07-21 (×6): 72 ug via ORAL
  Filled 2021-07-16 (×7): qty 1

## 2021-07-16 MED ORDER — SODIUM CHLORIDE 0.9 % IV SOLN
250.0000 mg | Freq: Every day | INTRAVENOUS | Status: AC
  Administered 2021-07-16 – 2021-07-17 (×2): 250 mg via INTRAVENOUS
  Filled 2021-07-16 (×3): qty 20

## 2021-07-16 NOTE — Progress Notes (Signed)
Progress Note   [name]:[id number] DOB: 1945/10/12 DOA: 07/13/2021     2 DOS: the patient was seen and examined on 07/16/2021   Brief hospital course: Sheryl Bender was admitted to the hospital with the working diagnosis of pancreatitis.  76 yo female with the past medical history of depression, pulmonary embolism and recent diagnosis of pancreatic cancer who presented with abdominal pain. 4 days prior to hospitalization patient had EGD with dilatation of pancreatic duct, biopsy resulted positive for pancreatic cancer. Patient reported abdominal pain post procedure, that remained constant over the following days, prompting her to come to the hospital. On her initial physical examination her blood pressure was 120/63, HR 69, RR 16 and 02 saturation 94%, lungs clear to auscultation, heart with S1 and S2 present and rhythmic, abdomen tender to palpation in the right upper quadrant, and left upper quadrant with no rebound, no lower extremity edema.   CT abdomen and pelvis with new fluid collection extending to the gastric antrum and ventral pancreas. Chronic dilatation of the pancreatic duct with abrupt termination of the duct dilatation in the head of the pancreas. Normal gallbladder.   Patient was placed on IV fluids and NPO, IV analgesics and as needed antiemetics.   GI was consulted.   06/21 diet advanced to clears.  06/22 improving symptoms, advanced to full liquids and getting CT chest for cancer staging.     Assessment and Plan: * Pancreatitis, acute Post procedure pancreatitis. Pancreatic cancer.   Improved abdominal pain and tolerating clear, continue to have intermittent nausea but not vomiting.   Plan to discontinue IV fluids Continue as needed analgesics and antiemetics Continue with antiacids Advance diet to full liquids.  Out of bed to chair tid with meals, PT and OT.  Further cancer staging with chest CT. Patient would like to continue cancer treatment  in [place].     Hypokalemia Electrolytes have been corrected. Her renal function is stable with serum cr at 0,76, K is 4,4 and serum bicarbonate at 26.  Plan to hold on IV fluids and continue to monitor electrolytes.   GERD (gastroesophageal reflux disease) Continue PPI  Constipation Improved with bowel regimen.   Mood disorder (HCC) Continue Wellbutrin, BuSpar, Seroquel, Lexapro Continue to monitor  Essential hypertension Continue metoprolol for blood pressure control.   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  Add IV iron during this hospitalization,         Subjective: Patient with no chest pain or dyspnea, abdominal pain with mild improvement but not back to baseline  Physical Exam: Vitals:   07/15/21 0449 07/15/21 1436 07/15/21 2059 07/16/21 1412  BP: (!) 116/56 (!) 115/51 124/81 133/68  Pulse: 83 71 77 74  Resp: '16 19 19 20  '$ Temp: 99.1 F (37.3 C) 97.9 F (36.6 C) 98.4 F (36.9 C) 98 F (36.7 C)  TempSrc: Oral Oral  Oral  SpO2: 95% 98% 96% 97%  Weight:      Height:       Neurology awake and alert ENT with positive pallor Cardiovascular with S1 and S2 present and rhythmic Respiratory with no rales or wheezing Abdomen not distended not tender to superficial palpation  No lower extremity edema  Data Reviewed:    Family Communication: no family at the bedside   Disposition: Status is: Inpatient Remains inpatient appropriate because: pancreatitis   Planned  Discharge Destination: Home    Author: Tawni Millers, MD 07/16/2021 2:32 PM  For on call review [email or web address].

## 2021-07-16 NOTE — Assessment & Plan Note (Signed)
Improved with bowel regimen

## 2021-07-17 DIAGNOSIS — K5900 Constipation, unspecified: Secondary | ICD-10-CM

## 2021-07-17 DIAGNOSIS — K2100 Gastro-esophageal reflux disease with esophagitis, without bleeding: Secondary | ICD-10-CM | POA: Diagnosis not present

## 2021-07-17 DIAGNOSIS — I10 Essential (primary) hypertension: Secondary | ICD-10-CM | POA: Diagnosis not present

## 2021-07-17 DIAGNOSIS — D509 Iron deficiency anemia, unspecified: Secondary | ICD-10-CM | POA: Diagnosis not present

## 2021-07-17 DIAGNOSIS — K858 Other acute pancreatitis without necrosis or infection: Secondary | ICD-10-CM | POA: Diagnosis not present

## 2021-07-17 LAB — BASIC METABOLIC PANEL
Anion gap: 5 (ref 5–15)
BUN: 6 mg/dL — ABNORMAL LOW (ref 8–23)
CO2: 25 mmol/L (ref 22–32)
Calcium: 8.4 mg/dL — ABNORMAL LOW (ref 8.9–10.3)
Chloride: 110 mmol/L (ref 98–111)
Creatinine, Ser: 0.74 mg/dL (ref 0.44–1.00)
GFR, Estimated: >60 mL/min (ref >60)
Glucose, Bld: 82 mg/dL (ref 70–99)
Potassium: 4.2 mmol/L (ref 3.5–5.1)
Sodium: 140 mmol/L (ref 135–145)

## 2021-07-17 MED ORDER — ESCITALOPRAM OXALATE 10 MG PO TABS
20.0000 mg | ORAL_TABLET | Freq: Every evening | ORAL | Status: DC
  Administered 2021-07-17 – 2021-07-21 (×5): 20 mg via ORAL
  Filled 2021-07-17 (×7): qty 2

## 2021-07-17 MED ORDER — QUETIAPINE FUMARATE 100 MG PO TABS
200.0000 mg | ORAL_TABLET | Freq: Every evening | ORAL | Status: DC
  Administered 2021-07-17 – 2021-07-20 (×4): 200 mg via ORAL
  Filled 2021-07-17 (×7): qty 2

## 2021-07-17 MED ORDER — BUSPIRONE HCL 5 MG PO TABS
20.0000 mg | ORAL_TABLET | Freq: Two times a day (BID) | ORAL | Status: DC
  Administered 2021-07-17 – 2021-07-21 (×8): 20 mg via ORAL
  Filled 2021-07-17 (×8): qty 4

## 2021-07-17 MED ORDER — ONDANSETRON HCL 4 MG/2ML IJ SOLN
4.0000 mg | INTRAMUSCULAR | Status: DC | PRN
  Administered 2021-07-17: 4 mg via INTRAVENOUS
  Filled 2021-07-17: qty 2

## 2021-07-17 MED ORDER — MIRTAZAPINE 15 MG PO TABS
30.0000 mg | ORAL_TABLET | Freq: Every evening | ORAL | Status: DC
  Administered 2021-07-17 – 2021-07-20 (×4): 30 mg via ORAL
  Filled 2021-07-17 (×7): qty 2

## 2021-07-17 MED ORDER — PROCHLORPERAZINE EDISYLATE 10 MG/2ML IJ SOLN
10.0000 mg | Freq: Once | INTRAMUSCULAR | Status: AC
  Administered 2021-07-17: 10 mg via INTRAVENOUS
  Filled 2021-07-17: qty 2

## 2021-07-17 MED ORDER — ONDANSETRON HCL 4 MG PO TABS
4.0000 mg | ORAL_TABLET | ORAL | Status: DC | PRN
  Administered 2021-07-17: 4 mg via ORAL
  Filled 2021-07-17: qty 1

## 2021-07-17 MED ORDER — ENSURE ENLIVE PO LIQD
237.0000 mL | Freq: Two times a day (BID) | ORAL | Status: DC
  Administered 2021-07-17 – 2021-07-21 (×7): 237 mL via ORAL

## 2021-07-17 NOTE — Progress Notes (Signed)
Nursing Progress Note   Patient assisted from bed side chair to bed. She's been up the [facility] a few times today as well. She attempted to eat some of her solid food today, however took only a few small bites. Nauseated more than yesterday. Extra one time dose of nausea meds given.  Patient could not attempt to eat any lunch due to nausea.   Would like night time meds given at 8pm due to drowsiness that follows, the order adjusted by physician.   Vitals:   07/16/21 2131 07/17/21 0611  BP: 120/63 124/62  Pulse: 73 77  Resp: 19 19  Temp: 98.7 F (37.1 C) 98.5 F (36.9 C)  SpO2: 93% 94%    Luther Bradley RN, BSN 07/17/2021 12:39 PM

## 2021-07-17 NOTE — Plan of Care (Signed)
  Problem: Acute Rehab PT Goals(only PT should resolve) Goal: Pt Will Go Supine/Side To Sit Outcome: Progressing Flowsheets (Taken 07/17/2021 1355) Pt will go Supine/Side to Sit: Independently Goal: Patient Will Transfer Sit To/From Stand Outcome: Progressing Flowsheets (Taken 07/17/2021 1355) Patient will transfer sit to/from stand: Independently Goal: Pt Will Transfer Bed To Chair/Chair To Bed Outcome: Progressing Flowsheets (Taken 07/17/2021 1355) Pt will Transfer Bed to Chair/Chair to Bed: Independently Goal: Pt Will Ambulate Outcome: Progressing Flowsheets (Taken 07/17/2021 1355) Pt will Ambulate:  > 125 feet  with modified independence  Independently  with cane   1:55 PM, 07/17/21 [name], MPT Physical Therapist with [facility] [phone] office [phone] mobile phone

## 2021-07-18 DIAGNOSIS — I10 Essential (primary) hypertension: Secondary | ICD-10-CM | POA: Diagnosis not present

## 2021-07-18 DIAGNOSIS — K858 Other acute pancreatitis without necrosis or infection: Secondary | ICD-10-CM | POA: Diagnosis not present

## 2021-07-18 DIAGNOSIS — K2100 Gastro-esophageal reflux disease with esophagitis, without bleeding: Secondary | ICD-10-CM | POA: Diagnosis not present

## 2021-07-18 DIAGNOSIS — K5900 Constipation, unspecified: Secondary | ICD-10-CM | POA: Diagnosis not present

## 2021-07-18 MED ORDER — METOPROLOL TARTRATE 5 MG/5ML IV SOLN: 5.0000 mg | INTRAVENOUS | Status: DC | PRN

## 2021-07-18 MED ORDER — SENNOSIDES-DOCUSATE SODIUM 8.6-50 MG PO TABS: 1.0000 | ORAL_TABLET | Freq: Every evening | ORAL | Status: DC | PRN

## 2021-07-18 MED ORDER — IPRATROPIUM-ALBUTEROL 0.5-2.5 (3) MG/3ML IN SOLN: 3.0000 mL | RESPIRATORY_TRACT | Status: DC | PRN

## 2021-07-18 MED ORDER — HYDRALAZINE HCL 20 MG/ML IJ SOLN: 10.0000 mg | INTRAMUSCULAR | Status: DC | PRN

## 2021-07-18 MED ORDER — GUAIFENESIN 100 MG/5ML PO LIQD: 5.0000 mL | ORAL | Status: DC | PRN

## 2021-07-18 MED ORDER — VITAMIN B-12 100 MCG PO TABS
500.0000 ug | ORAL_TABLET | Freq: Every day | ORAL | Status: DC
  Administered 2021-07-18 – 2021-07-21 (×4): 500 ug via ORAL
  Filled 2021-07-18 (×4): qty 5

## 2021-07-18 NOTE — Progress Notes (Signed)
PROGRESS NOTE    Sheryl Bender  ZOX:[id number] DOB: 04/12/1945 DOA: 07/13/2021 PCP: [name], DO   Brief Narrative:  76 year old with history of depression, PE, recent diagnosis of pancreatic cancer presented to the hospital with abdominal pain.  EUS on 6/15 showed pancreatic lesion with signs of ductal dilation underwent needle biopsy and postprocedure complained of abdominal pain.  CT abdomen pelvis showed new fluid collection to gastric antrum and ventral pancreas with chronic ductal dilation.  GI team was consulted.  Diet as tolerated.  Overall very weak.   Assessment & Plan:  Principal Problem:   Pancreatitis, acute Active Problems:   Hypokalemia   GERD (gastroesophageal reflux disease)   Constipation   Mood disorder (HCC)   Essential hypertension   Iron deficiency anemia     Assessment and Plan: * Pancreatitis, acute.  Pancreatic pseudocyst Pancreatic cancer Post procedure pancreatitis (fine needle biopsy).  Abdominal pain slowly improving but still has nausea.  Advance as tolerated.  CT chest does not show any evidence of metastatic disease.  Pain control, antiemetics, IV fluids, PPI.  Out of bed to chair. Patient would like to continue with cancer therapy with Oncology in [facility] but they are considering transitioning to [facility]   Hypokalemia Replete as needed  GERD (gastroesophageal reflux disease) PPI  Constipation Bowel regimen  Mood disorder (HCC) Continue Wellbutrin, BuSpar, Seroquel, Lexapro Need to time medications to 20:00 hrs to prevent somnolence.   Essential hypertension Continue metoprolol for blood pressure control.  IV as needed hydralazine and Lopressor  Iron deficiency anemia Iron deficiency anemia combined with anemia of chronic disease  Plan to 2 doses of IV iron during the hospitalization.  B12 deficiency - Replacement  PT/OT-evaluation   DVT prophylaxis: Lovenox Code Status: Full code Family Communication: Family is at  bedside  Status is: Inpatient Remains inpatient appropriate because: Maintain hospital stay until cleared by gastroenterology and she is able to tolerate oral without any issues.   Nutritional status    Signs/Symptoms: estimated needs  Interventions: Ensure Enlive (each supplement provides 350kcal and 20 grams of protein)  Body mass index is 23.13 kg/m.         Subjective: Seen and examined at bedside.  Tells me overall she feels very weak.  She still has some abdominal discomfort and difficulty with tolerating normal food.    Examination:  General exam: Appears calm and comfortable  Respiratory system: Clear to auscultation. Respiratory effort normal. Cardiovascular system: S1 & S2 heard, RRR. No JVD, murmurs, rubs, gallops or clicks. No pedal edema. Gastrointestinal system: Abdomen is slightly tender to deep palpation.  Has positive bowel sounds. Central nervous system: Alert and oriented. No focal neurological deficits. Extremities: Symmetric 5 x 5 power. Skin: No rashes, lesions or ulcers Psychiatry: Judgement and insight appear normal. Mood & affect appropriate.     Objective: Vitals:   07/16/21 2131 07/17/21 0611 07/17/21 1332 07/17/21 2035  BP: 120/63 124/62 121/66 (!) 144/70  Pulse: 73 77 72 80  Resp: 19 19 18 20   Temp: 98.7 F (37.1 C) 98.5 F (36.9 C) 98.6 F (37 C) 98.2 F (36.8 C)  TempSrc:   Oral Oral  SpO2: 93% 94% 95% 95%  Weight:      Height:        Intake/Output Summary (Last 24 hours) at 07/18/2021 0805 Last data filed at 07/17/2021 1300 Gross per 24 hour  Intake 480 ml  Output --  Net 480 ml   Filed Weights   07/13/21 2345  Weight: 58.3 kg     Data Reviewed:   CBC: Recent Labs  Lab 07/13/21 1318 07/14/21 0434 07/15/21 0419  WBC 10.4 10.2 8.3  NEUTROABS 8.1* 8.1*  --   HGB 12.9 11.4* 10.4*  HCT 40.3 35.7* 31.9*  MCV 97.6 97.0 96.7  PLT 239 223 200   Basic Metabolic Panel: Recent Labs  Lab 07/13/21 1318  07/14/21 0434 07/15/21 0419 07/16/21 0439 07/17/21 0442  NA 138 134* 140 141 140  K 3.4* 4.3 3.4* 4.4 4.2  CL 105 106 111 110 110  CO2 25 20* 25 26 25   GLUCOSE 114* 88 176* 85 82  BUN 16 13 6* 5* 6*  CREATININE 0.88 0.71 0.68 0.76 0.74  CALCIUM 8.7* 8.1* 8.2* 8.6* 8.4*  MG  --  2.2 1.9  --   --    GFR: Estimated Creatinine Clearance: 47.7 mL/min (by C-G formula based on SCr of 0.74 mg/dL). Liver Function Tests: Recent Labs  Lab 07/13/21 1318 07/14/21 0434 07/15/21 0419  AST 29 32 24  ALT 25 29 32  ALKPHOS 88 76 74  BILITOT 0.3 1.0 0.3  PROT 7.0 5.9* 5.4*  ALBUMIN 3.4* 2.8* 2.4*   Recent Labs  Lab 07/13/21 1318 07/14/21 0434 07/15/21 0419  LIPASE 313* 355* 282*  AMYLASE 368*  --   --    No results for input(s): "AMMONIA" in the last 168 hours. Coagulation Profile: No results for input(s): "INR", "PROTIME" in the last 168 hours. Cardiac Enzymes: No results for input(s): "CKTOTAL", "CKMB", "CKMBINDEX", "TROPONINI" in the last 168 hours. BNP (last 3 results) No results for input(s): "PROBNP" in the last 8760 hours. HbA1C: No results for input(s): "HGBA1C" in the last 72 hours. CBG: No results for input(s): "GLUCAP" in the last 168 hours. Lipid Profile: No results for input(s): "CHOL", "HDL", "LDLCALC", "TRIG", "CHOLHDL", "LDLDIRECT" in the last 72 hours. Thyroid Function Tests: No results for input(s): "TSH", "T4TOTAL", "FREET4", "T3FREE", "THYROIDAB" in the last 72 hours. Anemia Panel: No results for input(s): "VITAMINB12", "FOLATE", "FERRITIN", "TIBC", "IRON", "RETICCTPCT" in the last 72 hours. Sepsis Labs: No results for input(s): "PROCALCITON", "LATICACIDVEN" in the last 168 hours.  No results found for this or any previous visit (from the past 240 hour(s)).       Radiology Studies: CT CHEST WO CONTRAST  Result Date: 07/16/2021 CLINICAL DATA:  History of pancreas cancer.  Staging. EXAM: CT CHEST WITHOUT CONTRAST TECHNIQUE: Multidetector CT imaging of  the chest was performed following the standard protocol without IV contrast. RADIATION DOSE REDUCTION: This exam was performed according to the departmental dose-optimization program which includes automated exposure control, adjustment of the mA and/or kV according to patient size and/or use of iterative reconstruction technique. *body oncCOMPARISON:  None FINDINGS: Cardiovascular: Normal heart size. No pericardial effusion. Aortic atherosclerosis and coronary artery calcifications noted. Mediastinum/Nodes: Thyroid gland, trachea and esophagus are unremarkable. No enlarged axillary, supraclavicular, or mediastinal lymph nodes. Hilar lymph nodes are suboptimally evaluated due to lack of IV contrast material. Lungs/Pleura: There are small bilateral pleural effusions noted, right greater than left. Scattered areas of scar versus platelike atelectasis noted within both lower lobes. No airspace consolidation or pneumothorax identified. No suspicious pulmonary nodules. Upper Abdomen: Mass within the right upper quadrant of the abdomen around the head of pancreas and porta hepatic region is again noted corresponding to the recently described pancreatic pseudocyst. There is surrounding soft tissue stranding. Dilatation of the main pancreatic duct secondary to known pancreatic neoplasm is again noted. Musculoskeletal: Spondylosis identified  within the thoracic spine. No suspicious bone lesions identified IMPRESSION: 1. No evidence for metastatic disease to the chest. 2. Small bilateral pleural effusions, right greater than left. 3. Mass within the right upper quadrant of the abdomen around the head of pancreas and porta hepatic region is again noted corresponding to the recently described pancreatic pseudocyst. 4. Aortic Atherosclerosis (ICD10-I70.0). Electronically Signed   By: Signa Kell M.D.   On: 07/16/2021 16:21        Scheduled Meds:  buPROPion  300 mg Oral Daily   busPIRone  20 mg Oral BID   enoxaparin  (LOVENOX) injection  40 mg Subcutaneous Q24H   escitalopram  10 mg Oral Daily   escitalopram  20 mg Oral QPM   feeding supplement  237 mL Oral BID BM   linaclotide  72 mcg Oral QAC breakfast   metoprolol succinate  25 mg Oral Daily   mirtazapine  30 mg Oral QPM   pantoprazole  40 mg Oral BID   QUEtiapine  200 mg Oral QPM   QUEtiapine  25 mg Oral TID WC   Continuous Infusions:   LOS: 4 days   Time spent= 35 mins    Rakesha Dalporto Joline Maxcy, MD [facility]  If 7PM-7AM, please contact night-coverage  07/18/2021, 8:05 AM

## 2021-07-19 DIAGNOSIS — K2100 Gastro-esophageal reflux disease with esophagitis, without bleeding: Secondary | ICD-10-CM | POA: Diagnosis not present

## 2021-07-19 DIAGNOSIS — K858 Other acute pancreatitis without necrosis or infection: Secondary | ICD-10-CM | POA: Diagnosis not present

## 2021-07-19 DIAGNOSIS — D509 Iron deficiency anemia, unspecified: Secondary | ICD-10-CM | POA: Diagnosis not present

## 2021-07-19 DIAGNOSIS — K5900 Constipation, unspecified: Secondary | ICD-10-CM | POA: Diagnosis not present

## 2021-07-19 LAB — PROTIME-INR
INR: 1 (ref 0.8–1.2)
Prothrombin Time: 13.4 seconds (ref 11.4–15.2)

## 2021-07-19 LAB — COMPREHENSIVE METABOLIC PANEL
ALT: 52 U/L — ABNORMAL HIGH (ref 0–44)
AST: 58 U/L — ABNORMAL HIGH (ref 15–41)
Albumin: 2.6 g/dL — ABNORMAL LOW (ref 3.5–5.0)
Alkaline Phosphatase: 101 U/L (ref 38–126)
Anion gap: 9 (ref 5–15)
BUN: 12 mg/dL (ref 8–23)
CO2: 26 mmol/L (ref 22–32)
Calcium: 8.5 mg/dL — ABNORMAL LOW (ref 8.9–10.3)
Chloride: 101 mmol/L (ref 98–111)
Creatinine, Ser: 0.79 mg/dL (ref 0.44–1.00)
GFR, Estimated: >60 mL/min (ref >60)
Glucose, Bld: 95 mg/dL (ref 70–99)
Potassium: 3.6 mmol/L (ref 3.5–5.1)
Sodium: 136 mmol/L (ref 135–145)
Total Bilirubin: 0.7 mg/dL (ref 0.3–1.2)
Total Protein: 6.3 g/dL — ABNORMAL LOW (ref 6.5–8.1)

## 2021-07-19 LAB — MAGNESIUM: Magnesium: 2.1 mg/dL (ref 1.7–2.4)

## 2021-07-19 MED ORDER — POTASSIUM CHLORIDE CRYS ER 20 MEQ PO TBCR
40.0000 meq | EXTENDED_RELEASE_TABLET | Freq: Once | ORAL | Status: AC
  Administered 2021-07-19: 40 meq via ORAL
  Filled 2021-07-19: qty 2

## 2021-07-19 MED ORDER — SIMETHICONE 80 MG PO CHEW: 80.0000 mg | CHEWABLE_TABLET | Freq: Four times a day (QID) | ORAL | Status: DC | PRN

## 2021-07-19 MED ORDER — SODIUM CHLORIDE 0.9 % IV SOLN: INTRAVENOUS | Status: AC

## 2021-07-20 ENCOUNTER — Other Ambulatory Visit (HOSPITAL_COMMUNITY): Payer: Self-pay

## 2021-07-20 DIAGNOSIS — K859 Acute pancreatitis without necrosis or infection, unspecified: Secondary | ICD-10-CM

## 2021-07-20 DIAGNOSIS — I10 Essential (primary) hypertension: Secondary | ICD-10-CM | POA: Diagnosis not present

## 2021-07-20 DIAGNOSIS — K5900 Constipation, unspecified: Secondary | ICD-10-CM | POA: Diagnosis not present

## 2021-07-20 DIAGNOSIS — K2100 Gastro-esophageal reflux disease with esophagitis, without bleeding: Secondary | ICD-10-CM | POA: Diagnosis not present

## 2021-07-20 DIAGNOSIS — K858 Other acute pancreatitis without necrosis or infection: Secondary | ICD-10-CM | POA: Diagnosis not present

## 2021-07-20 DIAGNOSIS — C259 Malignant neoplasm of pancreas, unspecified: Secondary | ICD-10-CM

## 2021-07-20 LAB — COMPREHENSIVE METABOLIC PANEL
ALT: 59 U/L — ABNORMAL HIGH (ref 0–44)
AST: 57 U/L — ABNORMAL HIGH (ref 15–41)
Albumin: 2.5 g/dL — ABNORMAL LOW (ref 3.5–5.0)
Alkaline Phosphatase: 88 U/L (ref 38–126)
Anion gap: 7 (ref 5–15)
BUN: 13 mg/dL (ref 8–23)
CO2: 25 mmol/L (ref 22–32)
Calcium: 8.2 mg/dL — ABNORMAL LOW (ref 8.9–10.3)
Chloride: 105 mmol/L (ref 98–111)
Creatinine, Ser: 0.77 mg/dL (ref 0.44–1.00)
GFR, Estimated: >60 mL/min (ref >60)
Glucose, Bld: 99 mg/dL (ref 70–99)
Potassium: 3.9 mmol/L (ref 3.5–5.1)
Sodium: 137 mmol/L (ref 135–145)
Total Bilirubin: 0.4 mg/dL (ref 0.3–1.2)
Total Protein: 5.7 g/dL — ABNORMAL LOW (ref 6.5–8.1)

## 2021-07-20 LAB — MAGNESIUM: Magnesium: 2.1 mg/dL (ref 1.7–2.4)

## 2021-07-20 MED ORDER — POLYETHYLENE GLYCOL 3350 17 G PO PACK
17.0000 g | PACK | Freq: Every day | ORAL | Status: DC
  Administered 2021-07-20: 17 g via ORAL
  Filled 2021-07-20 (×2): qty 1

## 2021-07-20 NOTE — TOC Progression Note (Signed)
Transition of Care [facility]) - Progression Note    Patient Details  Name: Sheryl Bender MRN: [id number] Date of Birth: Nov 17, 1945  Transition [facility] Care [facility]) CM/SW Contact  Karn Cassis, [place] Phone Number: 07/20/2021, 1:51 PM  Clinical Narrative:  Discussed recommendation for rolling walker with pt who agrees and no preference on agency. Referred to [facility] with [employer] to drop ship to pt's home. Francis Dowse notified of anticipate d/c tomorrow. [facility] also aware of plan to d/c tomorrow. LCSW spoke with pt's son [name] with Fayrene Fearing, PT. [name] is aware that walker has been ordered and home health set up. He plans to call [facility] to discuss services further. TOC will continue to follow.      Expected Discharge Plan: Home w Home Health Services Barriers to Discharge: Continued Medical Work up  Expected Discharge Plan and Services Expected Discharge Plan: Home w Home Health Services     Post Acute Care Choice: Home Health Living arrangements for the past 2 months: Single Family Home                           HH Arranged: PT, OT HH Agency: [facility] Date HH Agency Contacted: 07/17/21       Social Determinants of Health (SDOH) Interventions    Readmission Risk Interventions    07/17/2021    1:22 PM  Readmission Risk Prevention Plan  Transportation Screening Complete  Home Care Screening Complete  Medication Review (RN CM) Complete

## 2021-07-20 NOTE — Progress Notes (Signed)
Patient has been stable, no only 1 complaint of pain.  Give tylenol prn.  Patient stated she had bm yesterday on 07-19-21. No new issues per patient.

## 2021-07-21 DIAGNOSIS — K858 Other acute pancreatitis without necrosis or infection: Secondary | ICD-10-CM | POA: Diagnosis not present

## 2021-07-21 DIAGNOSIS — I10 Essential (primary) hypertension: Secondary | ICD-10-CM | POA: Diagnosis not present

## 2021-07-21 DIAGNOSIS — K5900 Constipation, unspecified: Secondary | ICD-10-CM | POA: Diagnosis not present

## 2021-07-21 DIAGNOSIS — R5381 Other malaise: Secondary | ICD-10-CM

## 2021-07-21 DIAGNOSIS — K2100 Gastro-esophageal reflux disease with esophagitis, without bleeding: Secondary | ICD-10-CM | POA: Diagnosis not present

## 2021-07-21 DIAGNOSIS — C259 Malignant neoplasm of pancreas, unspecified: Secondary | ICD-10-CM

## 2021-07-21 LAB — COMPREHENSIVE METABOLIC PANEL
ALT: 43 U/L (ref 0–44)
AST: 30 U/L (ref 15–41)
Albumin: 2.5 g/dL — ABNORMAL LOW (ref 3.5–5.0)
Alkaline Phosphatase: 81 U/L (ref 38–126)
Anion gap: 9 (ref 5–15)
BUN: 15 mg/dL (ref 8–23)
CO2: 25 mmol/L (ref 22–32)
Calcium: 8.5 mg/dL — ABNORMAL LOW (ref 8.9–10.3)
Chloride: 107 mmol/L (ref 98–111)
Creatinine, Ser: 0.88 mg/dL (ref 0.44–1.00)
GFR, Estimated: >60 mL/min (ref >60)
Glucose, Bld: 92 mg/dL (ref 70–99)
Potassium: 3.6 mmol/L (ref 3.5–5.1)
Sodium: 141 mmol/L (ref 135–145)
Total Bilirubin: 0.3 mg/dL (ref 0.3–1.2)
Total Protein: 5.6 g/dL — ABNORMAL LOW (ref 6.5–8.1)

## 2021-07-21 LAB — MAGNESIUM: Magnesium: 2.1 mg/dL (ref 1.7–2.4)

## 2021-07-21 MED ORDER — ONDANSETRON 8 MG PO TBDP: 8.0000 mg | ORAL_TABLET | Freq: Three times a day (TID) | ORAL | 0 refills | Status: DC | PRN

## 2021-07-21 MED ORDER — POLYETHYLENE GLYCOL 3350 17 G PO PACK: 17.0000 g | PACK | Freq: Every day | ORAL | 1 refills | Status: DC

## 2021-07-21 MED ORDER — LINACLOTIDE 72 MCG PO CAPS: 72.0000 ug | ORAL_CAPSULE | Freq: Every day | ORAL | 2 refills | Status: DC

## 2021-07-21 MED ORDER — CYANOCOBALAMIN 500 MCG PO TABS: 500.0000 ug | ORAL_TABLET | Freq: Every day | ORAL | 2 refills | Status: DC

## 2021-07-21 MED ORDER — OXYCODONE HCL 5 MG PO TABS
5.0000 mg | ORAL_TABLET | Freq: Four times a day (QID) | ORAL | 0 refills | Status: DC | PRN
Start: 2021-07-21 — End: 2022-02-19

## 2021-07-21 MED ORDER — ENSURE ENLIVE PO LIQD: 237.0000 mL | Freq: Two times a day (BID) | ORAL | Status: DC

## 2021-07-21 MED ORDER — ACETAMINOPHEN 500 MG PO TABS: 1000.0000 mg | ORAL_TABLET | Freq: Three times a day (TID) | ORAL | Status: DC | PRN

## 2021-07-21 NOTE — TOC Transition Note (Signed)
Transition of Care [facility]) - CM/SW Discharge Note   Patient Details  Name: Sheryl Bender MRN: [id number] Date of Birth: March 06, 1945  Transition of Care [facility]) CM/SW Contact:  Villa Herb, LCSWA Phone Number: 07/21/2021, 4:25 PM   Clinical Narrative:    CSW updated [facility] of plan for pt to D/C home today. They requested CSW fax DC summary, H&P, face sheet, and orders. CSW faxed to [facility]. They will see pt tomorrow for start of care. TOC signing off.   Final next level of care: Home w Home Health Services Barriers to Discharge: Barriers Resolved   Patient Goals and CMS Choice Patient states their goals for this hospitalization and ongoing recovery are:: feel better CMS Medicare.gov Compare Post Acute Care list provided to:: Patient Choice offered to / list presented to : Patient  Discharge Placement                       Discharge Plan and Services     Post Acute Care Choice: Home Health                    HH Arranged: PT, OT [facility] Agency: [facility] Date HH Agency Contacted: 07/17/21      Social Determinants of Health (SDOH) Interventions     Readmission Risk Interventions    07/17/2021    1:22 PM  Readmission Risk Prevention Plan  Transportation Screening Complete  Home Care Screening Complete  Medication Review (RN CM) Complete

## 2021-07-21 NOTE — Discharge Summary (Signed)
Physician Discharge Summary   [name] MRN: [id number] DOB: 1945/09/13  Admit date:     07/13/2021  Discharge date: 07/21/21  Discharge Physician: Vassie Loll   PCP: [name], DO   Recommendations at discharge:  Repeat basic metabolic panel to follow electrolytes and renal function Repeat CBC to follow hemoglobin trend/stability Continue assessing patient response to pain and further adjust analgesic therapy Goals of care and advance care planning discussion recommended.  Discharge Diagnoses: Principal Problem:   Pancreatitis, acute Active Problems:   Hypokalemia   GERD (gastroesophageal reflux disease)   Constipation   Mood disorder (HCC)   Essential hypertension   Iron deficiency anemia   Physical deconditioning   Malignant neoplasm of pancreas [facility])  Hospital Course: Sheryl Bender was admitted to the hospital with the working diagnosis of pancreatitis, post pancreatic fine needle biopsy. New diagnosis of pancreatic adenocarcinoma.   76 yo female with the past medical history of depression, pulmonary embolism and recent diagnosis of pancreatic cancer who presented with abdominal pain. 4 days prior to hospitalization (June 15)  patient had endoscopic [place] finding a pancreatic lesion with signs of pancreatic duct dilatation. She underwent fine needle biopsy of the lesion which resulted positive for pancreatic cancer.  Patient reported abdominal pain post procedure, that remained constant over the following days, prompting her to come to the hospital.  On her initial physical examination her blood pressure was 120/63, HR 69, RR 16 and 02 saturation 94%, lungs clear to auscultation, heart with S1 and S2 present and rhythmic, abdomen tender to palpation in the right upper quadrant, and left upper quadrant with no rebound, no lower extremity edema.   CT abdomen and pelvis with new fluid collection extending to the gastric antrum and ventral pancreas. Chronic dilatation  of the pancreatic duct with abrupt termination of the duct dilatation in the head of the pancreas. Normal gallbladder.   Patient was placed on IV fluids and NPO, IV analgesics and as needed antiemetics.   GI was consulted.   Assessment and Plan: * Pancreatitis, acute.  Pancreatic pseudocyst Pancreatic cancer Post procedure pancreatitis (fine needle biopsy).  Abdominal pain slowly improving but still has nausea.  Advance as tolerated at GI discretion.   -CT chest does not show any evidence of metastatic disease.   -Patient tolerating diet; advised to maintain adequate hydration and continue the use of as needed antiemetics and analgesics. -Patient would like to continue with cancer therapy with Oncology.  Appointment has been arranged for July 29, 2021.   Hypokalemia -Continue to follow electrolytes and replete as needed.   GERD (gastroesophageal reflux disease) -Continue PPI.   Constipation -Continue to maintain adequate hydration and continue bowel regimen -Per GI recommendations MiraLAX and Linzess has been added.   Mood disorder (HCC) -Continue Wellbutrin, BuSpar, Seroquel, Lexapro -Overall stable mood appreciated today.   Essential hypertension -Continue metoprolol for blood pressure control.   -Vital signs stable currently.   Iron deficiency anemia -Iron deficiency anemia combined with anemia of chronic disease  -Status post 2 doses of IV iron -Continue outpatient follow-up of patient's hemoglobin trend. -No overt bleeding appreciated.   B12 deficiency -Continue replacement.   Physical deconditioning PT/OT Patient has been seen by physical therapy who has recommended home health PT and a walker at time of discharge.  Appreciate assistance by case manager recruiting services and equipment.    Consultants: Gastroenterology service Procedures performed: See below for x-ray reports. Disposition: Home with home health services.  Diet recommendation: Heart  healthy/low-fat diet.  DISCHARGE MEDICATION: Allergies as of 07/21/2021       Reactions   Other Hives   Cherry wood just cut- "smelled it and broke out"   [employer]        Medication List     TAKE these medications    acetaminophen 500 MG tablet Commonly known as: TYLENOL Take 2 tablets (1,000 mg total) by mouth every 8 (eight) hours as needed for moderate pain, headache or fever. What changed: reasons to take this   buPROPion 300 MG 24 hr tablet Commonly known as: WELLBUTRIN XL Take 300 mg by mouth daily.   busPIRone 10 MG tablet Commonly known as: BUSPAR Take 20 mg by mouth 2 (two) times daily.   escitalopram 20 MG tablet Commonly known as: LEXAPRO Take 30 mg by mouth See admin instructions. Take 1/2 tablet in the morning and 1 tablet at 830pm   esomeprazole 40 MG capsule Commonly known as: NexIUM Take 1 capsule (40 mg total) by mouth daily. What changed: how much to take   feeding supplement Liqd Take 237 mLs by mouth 2 (two) times daily between meals. Start taking on: July 22, 2021   linaclotide 72 MCG capsule Commonly known as: LINZESS Take 1 capsule (72 mcg total) by mouth daily before breakfast. Start taking on: July 22, 2021   metoprolol succinate 25 MG 24 hr tablet Commonly known as: TOPROL-XL Take 25 mg by mouth daily.   mirtazapine 30 MG tablet Commonly known as: REMERON Take 30 mg by mouth at bedtime.   ondansetron 8 MG disintegrating tablet Commonly known as: ZOFRAN-ODT Take 1 tablet (8 mg total) by mouth every 8 (eight) hours as needed for nausea or vomiting.   oxyCODONE 5 MG immediate release tablet Commonly known as: Oxy IR/ROXICODONE Take 1 tablet (5 mg total) by mouth every 6 (six) hours as needed for severe pain.   polyethylene glycol 17 g packet Commonly known as: MIRALAX / GLYCOLAX Take 17 g by mouth daily. Start taking on: July 22, 2021   QUEtiapine 200 MG tablet Commonly known as: SEROQUEL Take 200 mg by mouth at  bedtime.   QUEtiapine 50 MG tablet Commonly known as: SEROQUEL Take 25 mg by mouth 3 (three) times daily.   vitamin B-12 500 MCG tablet Commonly known as: CYANOCOBALAMIN Take 1 tablet (500 mcg total) by mouth daily. Start taking on: July 22, 2021   Vitamin D 125 MCG (5000 UT) Caps Take 5,000 Units by mouth every other day.   Vitamin D3 10 MCG (400 UNIT) tablet Take by mouth.               Durable Medical Equipment  (From admission, onward)           Start     Ordered   07/20/21 1142  For home use only DME Walker rolling  Once       Comments: Unsteady on feet with frequent staggering left/right with near loss of balance, safer using RW demonstrating good carryover without loss of balance  Question Answer Comment  Walker: With 5 Inch Wheels   Patient needs a walker to treat with the following condition Gait difficulty      07/20/21 1143   07/20/21 1135  For home use only DME Walker rolling  Once       Comments: Patient unsteady on feet with frequent staggering left/right with near loss of balance, safer using RW demonstrating good carryover without loss of balance or staggering.  Question  Answer Comment  Walker: With 5 Inch Wheels   Patient needs a walker to treat with the following condition Gait difficulty      07/20/21 1137   07/17/21 1430  For home use only DME Cane  Once        07/17/21 1429            Follow-up Information     [facility] Follow up.   Why: [facility] staff will call you to schedule in home therapy visits Contact information: [address] [place] [address] [phone]                Discharge Exam: Ceasar Mons Weights   07/13/21 2345  Weight: 58.3 kg   General exam: Alert, awake, oriented x 3; in no major distress.  Tolerating diet.  No fever, no nausea, no vomiting.  Still with intermittent abdominal discomfort. Respiratory system: Clear to auscultation. Respiratory effort normal.  No requiring  oxygen supplementation. Cardiovascular system:RRR. No murmurs, rubs, gallops. Gastrointestinal system: Abdomen is nondistended, soft and mildly tender to palpation in mid abdomen.  No guarding; no organomegaly or masses felt. Normal bowel sounds heard. Central nervous system: Alert and oriented. No focal neurological deficits. Extremities: No cyanosis or clubbing. Skin: No rashes, lesions or ulcers Psychiatry: Judgement and insight appear normal. Mood & affect appropriate.   Condition at discharge: Stable and improved.  The results of significant diagnostics from this hospitalization (including imaging, microbiology, ancillary and laboratory) are listed below for reference.   Imaging Studies: CT CHEST WO CONTRAST  Result Date: 07/16/2021 CLINICAL DATA:  History of pancreas cancer.  Staging. EXAM: CT CHEST WITHOUT CONTRAST TECHNIQUE: Multidetector CT imaging of the chest was performed following the standard protocol without IV contrast. RADIATION DOSE REDUCTION: This exam was performed according to the departmental dose-optimization program which includes automated exposure control, adjustment of the mA and/or kV according to patient size and/or use of iterative reconstruction technique. body onc  COMPARISON:  None FINDINGS: Cardiovascular: Normal heart size. No pericardial effusion. Aortic atherosclerosis and coronary artery calcifications noted. Mediastinum/Nodes: Thyroid gland, trachea and esophagus are unremarkable. No enlarged axillary, supraclavicular, or mediastinal lymph nodes. Hilar lymph nodes are suboptimally evaluated due to lack of IV contrast material. Lungs/Pleura: There are small bilateral pleural effusions noted, right greater than left. Scattered areas of scar versus platelike atelectasis noted within both lower lobes. No airspace consolidation or pneumothorax identified. No suspicious pulmonary nodules. Upper Abdomen: Mass within the right upper quadrant of the abdomen around the  head of pancreas and porta hepatic region is again noted corresponding to the recently described pancreatic pseudocyst. There is surrounding soft tissue stranding. Dilatation of the main pancreatic duct secondary to known pancreatic neoplasm is again noted. Musculoskeletal: Spondylosis identified within the thoracic spine. No suspicious bone lesions identified IMPRESSION: 1. No evidence for metastatic disease to the chest. 2. Small bilateral pleural effusions, right greater than left. 3. Mass within the right upper quadrant of the abdomen around the head of pancreas and porta hepatic region is again noted corresponding to the recently described pancreatic pseudocyst. 4. Aortic Atherosclerosis (ICD10-I70.0). Electronically Signed   By: Signa Kell M.D.   On: 07/16/2021 16:21   CT ABDOMEN PELVIS W CONTRAST  Result Date: 07/13/2021 CLINICAL DATA:  Abdominal pain.  Recent upper endoscopy 07/09/2021 EXAM: CT ABDOMEN AND PELVIS WITH CONTRAST TECHNIQUE: Multidetector CT imaging of the abdomen and pelvis was performed using the standard protocol following bolus administration of intravenous contrast. RADIATION DOSE  REDUCTION: This exam was performed according to the departmental dose-optimization program which includes automated exposure control, adjustment of the mA and/or kV according to patient size and/or use of iterative reconstruction technique. CONTRAST:  OMNIPAQUE IOHEXOL 300 MG/ML  SOLN COMPARISON:  MRI 02/06/2021 FINDINGS: Lower chest: Lung bases are clear. Hepatobiliary: Small cyst in the RIGHT hepatic lobe. No biliary duct dilatation. The gallbladder is collapsed. Common bile duct normal caliber. Pancreas: There is duct dilatation in the mid body and tail of the pancreas. There is abrupt termination of this duct dilatation in the head of the pancreas (images 18-23 of series 2). These findings are similar to comparison MRI. There is however a new elongated fluid collection extending from just above  the junction of the stomach and duodenum continuing inferiorly to the greater curvature of stomach. The collection is just ventral to the head of the pancreas. This collection measures 4.0 x 3.1 cm in axial dimension (image 28/2) and 7.9 cm in craniocaudad dimension (image 23.5). This elongated collection has appearance of the pancreatic pseudocysts. Spleen: Normal spleen Adrenals/urinary tract: Adrenal glands normal. Bilateral parapelvic cysts renal hilum. Ureters and bladder normal. Stomach/Bowel: Stomach is normal. There is compression of the antrum the stomach by the fluid collection described the pancreatic section. Duodenum small-bowel normal. No bowel obstruction. Normal colon. Rectum normal. Vascular/Lymphatic: Abdominal aorta is normal caliber with atherosclerotic calcification. There is no retroperitoneal or periportal lymphadenopathy. No pelvic lymphadenopathy. Reproductive: Uterus normal Other: No free fluid. Musculoskeletal: No aggressive osseous lesion IMPRESSION: Number 1. New fluid collection extending posterior to the gastric antrum and ventral to the pancreas. This elongated collection extends to the greater curvature of the stomach with inflammation surrounding the collection. 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 2. Chronic dilatation of the pancreatic duct with abrupt termination of the duct dilatation in the head of the pancreas. Findings are not changed from comparison MRI of 02/06/2021. Differential include benign and malignant stricturing of the pancreatic duct. 3. Normal gallbladder.  No biliary duct dilatation Electronically Signed   By: Genevive Bi M.D.   On: 07/13/2021 20:42   DG Chest 2 View  Result Date: 07/13/2021 CLINICAL DATA:  Chest pain EXAM: CHEST - 2 VIEW COMPARISON:  None Available. FINDINGS: Heart size and mediastinal contours are within normal limits. No suspicious pulmonary opacities identified. No pleural effusion or pneumothorax visualized. No  acute osseous abnormality appreciated. IMPRESSION: No acute intrathoracic process identified. Electronically Signed   By: Jannifer Hick M.D.   On: 07/13/2021 13:16    Microbiology: Results for orders placed or performed during the hospital encounter of 12/08/15  Surgical pcr screen     Status: None   Collection Time: 12/08/15  3:00 PM   Specimen: Nasal Mucosa; Nasal Swab  Result Value Ref Range Status   MRSA, PCR NEGATIVE NEGATIVE Final   Staphylococcus aureus NEGATIVE NEGATIVE Final    Comment:        The Xpert SA Assay (FDA approved for NASAL specimens in patients over 9 years of age), is one component of a comprehensive surveillance program.  Test performance has been validated by [facility] for patients greater than or equal to 33 year old. It is not intended to diagnose infection nor to guide or monitor treatment.     Labs: CBC: Recent Labs  Lab 07/15/21 0419  WBC 8.3  HGB 10.4*  HCT 31.9*  MCV 96.7  PLT 200   Basic Metabolic Panel: Recent Labs  Lab 07/15/21 0419 07/16/21 0439  07/17/21 0442 07/19/21 0419 07/20/21 0528 07/21/21 0356  NA 140 141 140 136 137 141  K 3.4* 4.4 4.2 3.6 3.9 3.6  CL 111 110 110 101 105 107  CO2 25 26 25 26 25 25   GLUCOSE 176* 85 82 95 99 92  BUN 6* 5* 6* 12 13 15   CREATININE 0.68 0.76 0.74 0.79 0.77 0.88  CALCIUM 8.2* 8.6* 8.4* 8.5* 8.2* 8.5*  MG 1.9  --   --  2.1 2.1 2.1   Liver Function Tests: Recent Labs  Lab 07/15/21 0419 07/19/21 0419 07/20/21 0528 07/21/21 0356  AST 24 58* 57* 30  ALT 32 52* 59* 43  ALKPHOS 74 101 88 81  BILITOT 0.3 0.7 0.4 0.3  PROT 5.4* 6.3* 5.7* 5.6*  ALBUMIN 2.4* 2.6* 2.5* 2.5*   Discharge time spent: greater than 30 minutes.  Signed: Vassie Loll, MD [facility] 07/21/2021

## 2021-07-29 ENCOUNTER — Inpatient Hospital Stay (HOSPITAL_COMMUNITY): Payer: Medicare Other | Attending: Hematology | Admitting: Hematology

## 2021-07-29 DIAGNOSIS — Z7901 Long term (current) use of anticoagulants: Secondary | ICD-10-CM | POA: Insufficient documentation

## 2021-07-29 DIAGNOSIS — C253 Malignant neoplasm of pancreatic duct: Secondary | ICD-10-CM | POA: Insufficient documentation

## 2021-07-29 DIAGNOSIS — C259 Malignant neoplasm of pancreas, unspecified: Secondary | ICD-10-CM

## 2021-07-29 DIAGNOSIS — Z86711 Personal history of pulmonary embolism: Secondary | ICD-10-CM | POA: Diagnosis not present

## 2021-07-29 DIAGNOSIS — K861 Other chronic pancreatitis: Secondary | ICD-10-CM | POA: Insufficient documentation

## 2021-07-29 NOTE — Progress Notes (Signed)
[place], [place] [address]   CLINIC:  Medical Oncology/Hematology  CONSULT NOTE  Patient Care Team: Sherrilee Gilles, DO as PCP - General (Family Medicine) [name], MD as Consulting Physician (Gastroenterology) [name], MD as Medical Oncologist (Medical Oncology) Brien Mates, RN as Oncology Nurse Navigator (Medical Oncology)  CHIEF COMPLAINTS/PURPOSE OF CONSULTATION:  Evaluation for newly diagnosed pancreatic cancer  HISTORY OF PRESENTING ILLNESS:  [name] 76 y.o. female is here because of evaluation for pancreatic cancer, at the request of [facility].  Today she reports feeling good, and she is accompanied by her son. She reports she had a PE in January diagnosed at [facility] in [place] for which she was place on Eliquis for 2 months. She denies knowledge of any infection immediately prior to this PE. She denies surgery immediately prior to her PE.  She reports losing 8-9 lbs in the past 3 months. She reports reduced appetite since developing pancreatitis in June. She denies nausea and vomiting. She reports occasional abdominal pain which she rates 2-3/10 and increased belching following eating. She reports depression. She denies history of cardiac issues, CVA, and MI. She denies tingling/numbness.  She lives at home on her own, and she is able to do all of her typical home activities. She is able to drive. 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 She denies smoking history. She reports second hand smoke exposure at her workplace. She reports her brother and maternal uncle had pancreatitis. She denies family history of cancer.    MEDICAL HISTORY:  Past Medical History:  Diagnosis Date   Anxiety    Arthritis    Depression    Dysrhythmia    hx palpitations greater than 5 yrs -neg echo, stress per pt ? where or dr   GERD (gastroesophageal reflux disease)    occ   Pulmonary embolism ([place])    September 2022    SURGICAL  HISTORY: Past Surgical History:  Procedure Laterality Date   ANTERIOR LAT LUMBAR FUSION Right 12/16/2015   Procedure: RIGHT LUMBAR TWO-THREE, LUMBAR THREE-FOUR, LUMBAR FOUR-FIVE ANTEROLATERAL LUMBAR INTERBODY FUSION;  Surgeon: Erline Levine, MD;  Location: [place];  Service: Neurosurgery;  Laterality: Right;   BIOPSY  07/09/2021   Procedure: BIOPSY;  Surgeon: Rush Landmark Telford Nab., MD;  Location: [place];  Service: Gastroenterology;;   ESOPHAGOGASTRODUODENOSCOPY N/A 07/09/2021   Procedure: ESOPHAGOGASTRODUODENOSCOPY (EGD);  Surgeon: Irving Copas., MD;  Location: [place];  Service: Gastroenterology;  Laterality: N/A;   EUS N/A 07/09/2021   Procedure: UPPER ENDOSCOPIC ULTRASOUND (EUS) RADIAL;  Surgeon: Irving Copas., MD;  Location: [place];  Service: Gastroenterology;  Laterality: N/A;   FINE NEEDLE ASPIRATION  07/09/2021   Procedure: FINE NEEDLE ASPIRATION (FNA) LINEAR;  Surgeon: Irving Copas., MD;  Location: [place];  Service: Gastroenterology;;   LUMBAR PERCUTANEOUS PEDICLE SCREW 3 LEVEL Bilateral 12/16/2015   Procedure: PERCUTANEOUS PEDICLE SCREWS BILATERALLY AT LUMBAR TWO-FIVE;  Surgeon: Erline Levine, MD;  Location: [place];  Service: Neurosurgery;  Laterality: Bilateral;   TUBAL LIGATION  1974    SOCIAL HISTORY: Social History   Socioeconomic History   Marital status: Widowed    Spouse name: Not on file   Number of children: Not on file   Years of education: Not on file   Highest education level: Not on file  Occupational History   Not on file  Tobacco Use   Smoking status: Never   Smokeless tobacco: Never  Substance and Sexual  Activity   Alcohol use: No   Drug use: Never   Sexual activity: Not on file  Other Topics Concern   Not on file  Social History Narrative   Not on file   Social Determinants of Health   Financial Resource Strain: Not on file  Food Insecurity: Not on file  Transportation Needs: Not on file  Physical  Activity: Not on file  Stress: Not on file  Social Connections: Not on file  Intimate Partner Violence: Not on file    FAMILY HISTORY: Family History  Problem Relation Age of Onset   Pancreatitis Brother        alcoholic pancreatitis   Pancreatic cancer Neg Hx    Colon cancer Neg Hx     ALLERGIES:  is allergic to other and cherry.  MEDICATIONS:  Current Outpatient Medications  Medication Sig Dispense Refill   acetaminophen (TYLENOL) 500 MG tablet Take 2 tablets (1,000 mg total) by mouth every 8 (eight) hours as needed for moderate pain, headache or fever.     buPROPion (WELLBUTRIN XL) 300 MG 24 hr tablet Take 300 mg by mouth daily.     busPIRone (BUSPAR) 10 MG tablet Take 20 mg by mouth 2 (two) times daily.     Cholecalciferol (VITAMIN D) 125 MCG (5000 UT) CAPS Take 5,000 Units by mouth every other day.     Cholecalciferol (VITAMIN D3) 10 MCG (400 UNIT) tablet Take by mouth.     escitalopram (LEXAPRO) 20 MG tablet Take 30 mg by mouth See admin instructions. Take 1/2 tablet in the morning and 1 tablet at 830pm     esomeprazole (NEXIUM) 40 MG capsule Take 1 capsule (40 mg total) by mouth daily. (Patient taking differently: Take 20 mg by mouth daily.) 30 capsule 1   feeding supplement (ENSURE ENLIVE / ENSURE PLUS) LIQD Take 237 mLs by mouth 2 (two) times daily between meals.     linaclotide (LINZESS) 72 MCG capsule Take 1 capsule (72 mcg total) by mouth daily before breakfast. 30 capsule 2   metoprolol succinate (TOPROL-XL) 25 MG 24 hr tablet Take 25 mg by mouth daily.     mirtazapine (REMERON) 30 MG tablet Take 30 mg by mouth at bedtime.     ondansetron (ZOFRAN-ODT) 8 MG disintegrating tablet Take 1 tablet (8 mg total) by mouth every 8 (eight) hours as needed for nausea or vomiting. 30 tablet 0   oxyCODONE (OXY IR/ROXICODONE) 5 MG immediate release tablet Take 1 tablet (5 mg total) by mouth every 6 (six) hours as needed for severe pain. 30 tablet 0   polyethylene glycol (MIRALAX /  GLYCOLAX) 17 g packet Take 17 g by mouth daily. 28 each 1   QUEtiapine (SEROQUEL) 200 MG tablet Take 200 mg by mouth at bedtime.     QUEtiapine (SEROQUEL) 50 MG tablet Take 25 mg by mouth 3 (three) times daily.     vitamin B-12 (CYANOCOBALAMIN) 500 MCG tablet Take 1 tablet (500 mcg total) by mouth daily. 30 tablet 2   No current facility-administered medications for this visit.    REVIEW OF SYSTEMS:   Review of Systems  Constitutional:  Positive for unexpected weight change. Negative for appetite change and fatigue.  Gastrointestinal:  Positive for abdominal pain (2/10 occasional) and constipation. Negative for nausea and vomiting.  Neurological:  Positive for dizziness. Negative for numbness.  Psychiatric/Behavioral:  Positive for depression. The patient is nervous/anxious.   All other systems reviewed and are negative.    PHYSICAL EXAMINATION: ECOG  PERFORMANCE STATUS: 1 - Symptomatic but completely ambulatory  There were no vitals filed for this visit. There were no vitals filed for this visit. Physical Exam Vitals reviewed.  Constitutional:      Appearance: Normal appearance.  Cardiovascular:     Rate and Rhythm: Normal rate and regular rhythm.     Pulses: Normal pulses.     Heart sounds: Normal heart sounds.  Pulmonary:     Effort: Pulmonary effort is normal.     Breath sounds: Normal breath sounds.  Abdominal:     Palpations: Abdomen is soft. There is no hepatomegaly, splenomegaly or mass.     Tenderness: There is abdominal tenderness in the epigastric area.  Musculoskeletal:     Right lower leg: No edema.     Left lower leg: No edema.  Lymphadenopathy:     Cervical: No cervical adenopathy.     Right cervical: No superficial, deep or posterior cervical adenopathy.    Left cervical: No superficial, deep or posterior cervical adenopathy.     Upper Body:     Right upper body: No supraclavicular adenopathy.     Left upper body: No supraclavicular adenopathy.     Lower  Body: No right inguinal adenopathy. No left inguinal adenopathy.  Neurological:     General: No focal deficit present.     Mental Status: She is alert and oriented to person, place, and time.  Psychiatric:        Mood and Affect: Mood normal.        Behavior: Behavior normal.      LABORATORY DATA:  I have reviewed the data as listed    Latest Ref Rng & Units 07/15/2021    4:19 AM 07/14/2021    4:34 AM 07/13/2021    1:18 PM  CBC  WBC 4.0 - 10.5 K/uL 8.3  10.2  10.4   Hemoglobin 12.0 - 15.0 g/dL 10.4  11.4  12.9   Hematocrit 36.0 - 46.0 % 31.9  35.7  40.3   Platelets 150 - 400 K/uL 200  223  239       Latest Ref Rng & Units 07/21/2021    3:56 AM 07/20/2021    5:28 AM 07/19/2021    4:19 AM  CMP  Glucose 70 - 99 mg/dL 92  99  95   BUN 8 - 23 mg/dL '15  13  12   '$ Creatinine 0.44 - 1.00 mg/dL 0.88  0.77  0.79   Sodium 135 - 145 mmol/L 141  137  136   Potassium 3.5 - 5.1 mmol/L 3.6  3.9  3.6   Chloride 98 - 111 mmol/L 107  105  101   CO2 22 - 32 mmol/L '25  25  26   '$ Calcium 8.9 - 10.3 mg/dL 8.5  8.2  8.5   Total Protein 6.5 - 8.1 g/dL 5.6  5.7  6.3   Total Bilirubin 0.3 - 1.2 mg/dL 0.3  0.4  0.7   Alkaline Phos 38 - 126 U/L 81  88  101   AST 15 - 41 U/L 30  57  58   ALT 0 - 44 U/L 43  59  52     RADIOGRAPHIC STUDIES: I have personally reviewed the radiological images as listed and agreed with the findings in the report.   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 - EGD/EUS (07/09/2021) by  Dr. Rush Landmark: The region where the PD is strictured, darker appearance was identified.  There was no clear hypoechoic mass.  Endosonographic appearance suggestive of either chronic pancreatitis changes versus possible malignancy.  Endosonographic staging T1 N0 MX.  Pancreatic parenchymal abnormalities consisting of atrophy and hyperechoic strands.  No sign of  significant pathology in the CBD.  No malignant appearing lymph nodes in the celiac region, peripancreatic region and porta hepatic region - Pathology (pancreatic duct FNA): Malignant cells consistent with adenocarcinoma - CA 19-9: 31 (0-35) - CTAP (07/13/2021): Fluid collection extending posterior to the gastric antrum and ventral to the pancreas.  Elongated collection extends to the greater curvature of the stomach with inflammation surrounding the collection suggestive of a pancreatic pseudocyst.  Chronic dilatation of pancreatic duct with abrupt termination of the duct dilatation in the head of the pancreas. - CT chest (07/16/2021): No evidence of metastatic disease in the chest. - 8 to 9 pound weight loss in the last 3 months, decreased appetite.   Social/family history: - She is accompanied by her son Sheryl Bender today.  She lives at home by herself.  She is independent of ADLs and IADLs.  She did office work prior to retirement.  Non-smoker and nonalcoholic. - Brother (alcoholic) and maternal half uncle had pancreatitis.  No history of malignancy.   PLAN:  Stage I (T1 N0 MX) pancreatic adenocarcinoma: - I have reviewed findings on the scans, pathology with the patient and her son in detail. - As there is no clear pancreatic mass visualized on regular CT scan of the abdomen and pelvis, I have recommended MRI of the abdomen with and without contrast, pancreatic protocol. - She will proceed with PET CT scan to evaluate for distant metastatic disease. - We have discussed further treatment options including surgery followed by adjuvant chemotherapy.  If there are any surprising findings on the scan which will make her borderline resectable, she will receive neoadjuvant chemotherapy followed by surgery. - Also recommend germline mutation testing.  We will make referral to our geneticist. - RTC after scans to discuss further plan.  2.  Pulmonary embolism: - She was diagnosed with pulmonary embolism in  January 2023 and then will hospital.  There does not appear to be any provoking factors for PE.  Reportedly Doppler was negative for DVT.  She was treated with Eliquis for 60 days, discontinued after that.   All questions were answered. The patient knows to call the clinic with any problems, questions or concerns.   [name], MD, 07/29/21 2:56 PM  [place] [phone]   I, Thana Ates, am acting as a scribe for Dr. [name].  I, [name] MD, have reviewed the above documentation for accuracy and completeness, and I agree with the above.

## 2021-07-29 NOTE — Patient Instructions (Addendum)
[place] at [facility] Discharge Instructions  You were seen and examined today by Dr. Delton Coombes. Dr. Delton Coombes is a medical oncologist, meaning that he specializes in the treatment of cancer diagnoses. Dr. Delton Coombes discussed your past medical history, family history of cancers, and the events that led to you being here today.  You were referred to Dr. Delton Coombes due to a new diagnosis of Pancreatic Cancer.  Dr. Delton Coombes has recommended a PET scan. A PET scan is a whole body CT scan that illuminates where there is cancer present within the body. This will accurately stage your cancer.  Dr. Delton Coombes has also recommended a Pancreatic Protocol MRI. This will be helpful in identifying surgical candidacy.  Follow-up with Dr. Delton Coombes as scheduled.   Thank you for choosing [place] at [facility] to provide your oncology and hematology care.  To afford each patient quality time with our provider, please arrive at least 15 minutes before your scheduled appointment time.   If you have a lab appointment with the [facility] please come in thru the Main Entrance and check in at the main information desk.  You need to re-schedule your appointment should you arrive 10 or more minutes late.  We strive to give you quality time with our providers, and arriving late affects you and other patients whose appointments are after yours.  Also, if you no show three or more times for appointments you may be dismissed from the clinic at the providers discretion.     Again, thank you for choosing [facility].  Our hope is that these requests will decrease the amount of time that you wait before being seen by our physicians.       _____________________________________________________________  Should you have questions after your visit to [facility], please contact our office at [phone] and follow the prompts.  Our office  hours are 8:00 a.m. and 4:30 p.m. Monday - Friday.  Please note that voicemails left after 4:00 p.m. may not be returned until the following business day.  We are closed weekends and major holidays.  You do have access to a nurse 24-7, just call the main number to the clinic [phone] and do not press any options, hold on the line and a nurse will answer the phone.    For prescription refill requests, have your pharmacy contact our office and allow 72 hours.

## 2021-07-30 ENCOUNTER — Other Ambulatory Visit (HOSPITAL_COMMUNITY): Payer: Self-pay

## 2021-07-30 ENCOUNTER — Ambulatory Visit (HOSPITAL_COMMUNITY)
Admission: RE | Admit: 2021-07-30 | Discharge: 2021-07-30 | Disposition: A | Discharge status: Home / Self Care | Payer: Medicare Other | Source: Ambulatory Visit | Attending: Hematology | Admitting: Hematology

## 2021-07-30 DIAGNOSIS — C259 Malignant neoplasm of pancreas, unspecified: Secondary | ICD-10-CM | POA: Insufficient documentation

## 2021-07-30 MED ORDER — FLUDEOXYGLUCOSE F - 18 (FDG) INJECTION
6.7000 | Freq: Once | INTRAVENOUS | Status: AC | PRN
  Administered 2021-07-30: 6.7 via INTRAVENOUS

## 2021-08-03 ENCOUNTER — Ambulatory Visit (HOSPITAL_COMMUNITY)
Admission: RE | Admit: 2021-08-03 | Discharge: 2021-08-03 | Disposition: A | Discharge status: Home / Self Care | Payer: Medicare Other | Source: Ambulatory Visit | Attending: Hematology | Admitting: Hematology

## 2021-08-03 ENCOUNTER — Other Ambulatory Visit (HOSPITAL_COMMUNITY): Payer: Self-pay | Admitting: Hematology

## 2021-08-03 DIAGNOSIS — C259 Malignant neoplasm of pancreas, unspecified: Secondary | ICD-10-CM | POA: Insufficient documentation

## 2021-08-03 MED ORDER — GADOBUTROL 1 MMOL/ML IV SOLN
6.0000 mL | Freq: Once | INTRAVENOUS | Status: AC | PRN
  Administered 2021-08-03: 6 mL via INTRAVENOUS

## 2021-08-05 ENCOUNTER — Inpatient Hospital Stay (HOSPITAL_BASED_OUTPATIENT_CLINIC_OR_DEPARTMENT_OTHER): Payer: Medicare Other | Admitting: Hematology

## 2021-08-05 VITALS — HR 80 | Temp 98.2°F | Resp 18 | Ht 62.5 in | Wt 120.3 lb

## 2021-08-05 DIAGNOSIS — C259 Malignant neoplasm of pancreas, unspecified: Secondary | ICD-10-CM

## 2021-08-05 DIAGNOSIS — C253 Malignant neoplasm of pancreatic duct: Secondary | ICD-10-CM | POA: Diagnosis not present

## 2021-08-05 NOTE — Patient Instructions (Signed)
[place] at [facility] Discharge Instructions  You were seen and examined today by Dr. Delton Coombes.  Dr. Delton Coombes discussed your most recent PET and MRI scan which revealed that your cancer has not spread. It shows that you have some inflammation from your pancreatis.   Dr. Delton Coombes is referring you to see [facility] Dr. Marcello Moores.    Follow-up as scheduled.    Thank you for choosing [place] at [facility] to provide your oncology and hematology care.  To afford each patient quality time with our provider, please arrive at least 15 minutes before your scheduled appointment time.   If you have a lab appointment with the [facility] please come in thru the Main Entrance and check in at the main information desk.  You need to re-schedule your appointment should you arrive 10 or more minutes late.  We strive to give you quality time with our providers, and arriving late affects you and other patients whose appointments are after yours.  Also, if you no show three or more times for appointments you may be dismissed from the clinic at the providers discretion.     Again, thank you for choosing [facility].  Our hope is that these requests will decrease the amount of time that you wait before being seen by our physicians.       _____________________________________________________________  Should you have questions after your visit to [facility] please contact our office at [phone] and follow the prompts.  Our office hours are 8:00 a.m. and 4:30 p.m. Monday - Friday.  Please note that voicemails left after 4:00 p.m. may not be returned until the following business day.  We are closed weekends and major holidays.  You do have access to a nurse 24-7, just call the main number to the clinic [phone] and do not press any options, hold on the line and a nurse will answer the phone.    For  prescription refill requests, have your pharmacy contact our office and allow 72 hours.    Due to Covid, you will need to wear a mask upon entering the hospital. If you do not have a mask, a mask will be given to you at the Main Entrance upon arrival. For doctor visits, patients may have 1 support person age 80 or older with them. For treatment visits, patients can not have anyone with them due to social distancing guidelines and our immunocompromised population.

## 2021-08-05 NOTE — Progress Notes (Signed)
Sheryl Bender,  [id number]   CLINIC:  Medical Oncology/Hematology  PCP:  Sherrilee Gilles, DO [address] [place] [address] [phone]   REASON FOR VISIT:  Follow-up for pancreatic cancer  PRIOR THERAPY: none  NGS Results: not done  CURRENT THERAPY: under work-up  BRIEF ONCOLOGIC HISTORY:  Oncology History   No history exists.    CANCER STAGING: Cancer Staging  Malignant neoplasm of pancreas [facility] Staging form: Exocrine Pancreas, AJCC 8th Edition - Clinical stage from 07/29/2021: Stage IA (cT1, cN0, cM0) - Unsigned   INTERVAL HISTORY:  [name], a 76 y.o. female, returns for routine follow-up of her pancreatic cancer. Sheryl Bender was last seen on 07/29/2021.   Today she reports feeling well, and she is accompanied by her son. She reports continued abdominal pain which has improved, and she denies sharp abdominal pain. She has been trying to eat a low-fat diet. She reports reduced appetite.  REVIEW OF SYSTEMS:  Review of Systems  Constitutional:  Positive for appetite change. Negative for fatigue.  Respiratory:  Positive for shortness of breath.   Gastrointestinal:  Positive for abdominal pain and nausea.  Neurological:  Positive for dizziness.  All other systems reviewed and are negative.   PAST MEDICAL/SURGICAL HISTORY:  Past Medical History:  Diagnosis Date   Anxiety    Arthritis    Depression    Dysrhythmia    hx palpitations greater than 5 yrs -neg echo, stress per pt ? where or dr   GERD (gastroesophageal reflux disease)    occ   Pulmonary embolism ([place])    September 2022   Past Surgical History:  Procedure Laterality Date   ANTERIOR LAT LUMBAR FUSION Right 12/16/2015   Procedure: RIGHT LUMBAR TWO-THREE, LUMBAR THREE-FOUR, LUMBAR FOUR-FIVE ANTEROLATERAL LUMBAR INTERBODY FUSION;  Surgeon: Erline Levine, MD;  Location: [place];  Service: Neurosurgery;  Laterality: Right;   BIOPSY  07/09/2021   Procedure:  BIOPSY;  Surgeon: Rush Landmark Telford Nab., MD;  Location: [place];  Service: Gastroenterology;;   ESOPHAGOGASTRODUODENOSCOPY N/A 07/09/2021   Procedure: ESOPHAGOGASTRODUODENOSCOPY (EGD);  Surgeon: Irving Copas., MD;  Location: [place];  Service: Gastroenterology;  Laterality: N/A;   EUS N/A 07/09/2021   Procedure: UPPER ENDOSCOPIC ULTRASOUND (EUS) RADIAL;  Surgeon: Irving Copas., MD;  Location: [place];  Service: Gastroenterology;  Laterality: N/A;   FINE NEEDLE ASPIRATION  07/09/2021   Procedure: FINE NEEDLE ASPIRATION (FNA) LINEAR;  Surgeon: Irving Copas., MD;  Location: [place];  Service: Gastroenterology;;   LUMBAR PERCUTANEOUS PEDICLE SCREW 3 LEVEL Bilateral 12/16/2015   Procedure: PERCUTANEOUS PEDICLE SCREWS BILATERALLY AT LUMBAR TWO-FIVE;  Surgeon: Erline Levine, MD;  Location: [place];  Service: Neurosurgery;  Laterality: Bilateral;   TUBAL LIGATION  1974    SOCIAL HISTORY:  Social History   Socioeconomic History   Marital status: Widowed    Spouse name: Not on file   Number of children: Not on file   Years of education: Not on file   Highest education level: Not on file  Occupational History   Not on file  Tobacco Use   Smoking status: Never   Smokeless tobacco: Never  Substance and Sexual Activity   Alcohol use: No   Drug use: Never   Sexual activity: Not on file  Other Topics Concern   Not on file  Social History Narrative   Not on file   Social Determinants of Health   Financial Resource Strain: Not on file  Food Insecurity: Not on file  Transportation Needs: Not on file  Physical Activity: Not on file  Stress: Not on file  Social Connections: Not on file  Intimate Partner Violence: Not on file    FAMILY HISTORY:  Family History  Problem Relation Age of Onset   Pancreatitis Brother        alcoholic pancreatitis   Pancreatic cancer Neg Hx    Colon cancer Neg Hx     CURRENT MEDICATIONS:  Current Outpatient  Medications  Medication Sig Dispense Refill   acetaminophen (TYLENOL) 500 MG tablet Take 2 tablets (1,000 mg total) by mouth every 8 (eight) hours as needed for moderate pain, headache or fever.     buPROPion (WELLBUTRIN XL) 300 MG 24 hr tablet Take 300 mg by mouth daily.     busPIRone (BUSPAR) 10 MG tablet Take 20 mg by mouth 2 (two) times daily.     Cholecalciferol (VITAMIN D) 125 MCG (5000 UT) CAPS Take 5,000 Units by mouth every other day.     Cholecalciferol (VITAMIN D3) 10 MCG (400 UNIT) tablet Take by mouth.     escitalopram (LEXAPRO) 20 MG tablet Take 30 mg by mouth See admin instructions. Take 1/2 tablet in the morning and 1 tablet at 830pm     esomeprazole (NEXIUM) 40 MG capsule Take 1 capsule (40 mg total) by mouth daily. (Patient taking differently: Take 20 mg by mouth daily.) 30 capsule 1   feeding supplement (ENSURE ENLIVE / ENSURE PLUS) LIQD Take 237 mLs by mouth 2 (two) times daily between meals.     linaclotide (LINZESS) 72 MCG capsule Take 1 capsule (72 mcg total) by mouth daily before breakfast. 30 capsule 2   metoprolol succinate (TOPROL-XL) 25 MG 24 hr tablet Take 25 mg by mouth daily.     mirtazapine (REMERON) 30 MG tablet Take 30 mg by mouth at bedtime.     ondansetron (ZOFRAN-ODT) 8 MG disintegrating tablet Take 1 tablet (8 mg total) by mouth every 8 (eight) hours as needed for nausea or vomiting. 30 tablet 0   oxyCODONE (OXY IR/ROXICODONE) 5 MG immediate release tablet Take 1 tablet (5 mg total) by mouth every 6 (six) hours as needed for severe pain. 30 tablet 0   polyethylene glycol (MIRALAX / GLYCOLAX) 17 g packet Take 17 g by mouth daily. 28 each 1   QUEtiapine (SEROQUEL) 200 MG tablet Take 200 mg by mouth at bedtime.     QUEtiapine (SEROQUEL) 50 MG tablet Take 25 mg by mouth 3 (three) times daily.     vitamin B-12 (CYANOCOBALAMIN) 500 MCG tablet Take 1 tablet (500 mcg total) by mouth daily. 30 tablet 2   No current facility-administered medications for this visit.     ALLERGIES:  Allergies  Allergen Reactions   Other Hives    Cherry wood just cut- "smelled it and broke out"      [employer]    PHYSICAL EXAM:  Performance status (ECOG): 1 - Symptomatic but completely ambulatory  There were no vitals filed for this visit. Wt Readings from Last 3 Encounters:  07/13/21 128 lb 8.5 oz (58.3 kg)  07/09/21 125 lb (56.7 kg)  01/28/21 133 lb (60.3 kg)   Physical Exam Vitals reviewed.  Constitutional:      Appearance: Normal appearance.  Cardiovascular:     Rate and Rhythm: Normal rate and regular rhythm.     Pulses: Normal pulses.     Heart sounds: Normal heart sounds.  Pulmonary:  Effort: Pulmonary effort is normal.     Breath sounds: Normal breath sounds.  Neurological:     General: No focal deficit present.     Mental Status: She is alert and oriented to person, place, and time.  Psychiatric:        Mood and Affect: Mood normal.        Behavior: Behavior normal.      LABORATORY DATA:  I have reviewed the labs as listed.     Latest Ref Rng & Units 07/15/2021    4:19 AM 07/14/2021    4:34 AM 07/13/2021    1:18 PM  CBC  WBC 4.0 - 10.5 K/uL 8.3  10.2  10.4   Hemoglobin 12.0 - 15.0 g/dL 10.4  11.4  12.9   Hematocrit 36.0 - 46.0 % 31.9  35.7  40.3   Platelets 150 - 400 K/uL 200  223  239       Latest Ref Rng & Units 07/21/2021    3:56 AM 07/20/2021    5:28 AM 07/19/2021    4:19 AM  CMP  Glucose 70 - 99 mg/dL 92  99  95   BUN 8 - 23 mg/dL '15  13  12   '$ Creatinine 0.44 - 1.00 mg/dL 0.88  0.77  0.79   Sodium 135 - 145 mmol/L 141  137  136   Potassium 3.5 - 5.1 mmol/L 3.6  3.9  3.6   Chloride 98 - 111 mmol/L 107  105  101   CO2 22 - 32 mmol/L '25  25  26   '$ Calcium 8.9 - 10.3 mg/dL 8.5  8.2  8.5   Total Protein 6.5 - 8.1 g/dL 5.6  5.7  6.3   Total Bilirubin 0.3 - 1.2 mg/dL 0.3  0.4  0.7   Alkaline Phos 38 - 126 U/L 81  88  101   AST 15 - 41 U/L 30  57  58   ALT 0 - 44 U/L 43  59  52     DIAGNOSTIC IMAGING:  I have  independently reviewed the scans and discussed with the patient.    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 - EGD/EUS (07/09/2021) by Dr. Rush Landmark: The region where the PD is strictured, darker appearance was identified.  There was no clear hypoechoic mass.  Endosonographic appearance suggestive of either chronic pancreatitis changes versus possible malignancy.  Endosonographic staging T1 N0 MX.  Pancreatic parenchymal abnormalities consisting of atrophy and hyperechoic strands.  No sign of significant pathology in the CBD.  No malignant appearing lymph nodes in the celiac region, peripancreatic region and porta hepatic region - Pathology (pancreatic duct FNA): Malignant cells consistent with adenocarcinoma - CA 19-9: 31 (0-35) - CTAP (07/13/2021): Fluid collection extending posterior to the gastric antrum and ventral to the pancreas.  Elongated collection extends to the greater curvature of the stomach with inflammation surrounding the collection suggestive of a pancreatic pseudocyst.  Chronic dilatation of pancreatic duct with abrupt termination of the duct dilatation in the head of the pancreas. - CT chest (07/16/2021): No evidence of metastatic disease in the chest. - 8 to 9 pound weight loss in the last 3 months, decreased appetite.    Social/family history: - She is accompanied by her son Sheryl Bender today.  She lives at home by herself.  She is independent of ADLs and IADLs.  She did office  work prior to retirement.  Non-smoker and nonalcoholic. - Brother (alcoholic) and maternal half uncle had pancreatitis.  No history of malignancy.  3. Pulmonary embolism: - She was diagnosed with pulmonary embolism in January 2023 and then will hospital.  There does not appear to be any provoking factors for PE.  Reportedly Doppler was negative for DVT.  She was treated with  Eliquis for 60 days, discontinued after that.   PLAN:  Stage I (T1 N0 MX) pancreatic adenocarcinoma: - PET scan (07/30/2021): Faint focus of increased metabolic activity, SUV 3.6, measuring 9 mm at the junction of the pancreatic head and body, suspicious for pancreatic adenocarcinoma.  No obvious extrapancreatic spread of tumor.  Cystic lesion along the posterior margin of the stomach and anterior margin of the pancreatic head favored pseudocyst.  Small amount of pelvic ascites. - MRI of the abdomen pancreatic protocol (08/03/2021): Similar appearance of the pancreas, with abrupt transition from dilated to decompressed duct in the pancreatic head with no evidence of underlying mass.  Pseudocyst enlarged position between pancreatic head and GE junction with significant mass effect upon the pyloric region and gastric outlet obstruction radiologically.  No evidence of abdominal metastatic disease. - I have recommended a consultation with Dr. Marcello Moores regarding surgical resection.  We will see her back 4 weeks after surgery to discuss adjuvant chemotherapy.    Orders placed this encounter:  No orders of the defined types were placed in this encounter.    [name], MD [place] [phone]   I, Thana Ates, am acting as a scribe for Dr. [name].  I, [name] MD, have reviewed the above documentation for accuracy and completeness, and I agree with the above.

## 2021-08-06 ENCOUNTER — Encounter (HOSPITAL_COMMUNITY): Payer: Self-pay | Admitting: Lab

## 2021-08-06 NOTE — Progress Notes (Unsigned)
Referral sent to [facility].  Records faxed on 7/13

## 2021-08-23 ENCOUNTER — Inpatient Hospital Stay (HOSPITAL_COMMUNITY)
Admission: AD | Admit: 2021-08-23 | Discharge: 2021-08-31 | DRG: 439 | Disposition: A | Discharge status: Home / Self Care | Payer: Medicare Other | Source: Other Acute Inpatient Hospital | Attending: Internal Medicine | Admitting: Internal Medicine

## 2021-08-23 ENCOUNTER — Encounter: Payer: Self-pay | Admitting: Gastroenterology

## 2021-08-23 ENCOUNTER — Other Ambulatory Visit: Payer: Self-pay

## 2021-08-23 DIAGNOSIS — I10 Essential (primary) hypertension: Secondary | ICD-10-CM | POA: Diagnosis not present

## 2021-08-23 DIAGNOSIS — Z91018 Allergy to other foods: Secondary | ICD-10-CM | POA: Diagnosis not present

## 2021-08-23 DIAGNOSIS — Z6822 Body mass index (BMI) 22.0-22.9, adult: Secondary | ICD-10-CM | POA: Diagnosis not present

## 2021-08-23 DIAGNOSIS — K5900 Constipation, unspecified: Secondary | ICD-10-CM | POA: Diagnosis not present

## 2021-08-23 DIAGNOSIS — K311 Adult hypertrophic pyloric stenosis: Secondary | ICD-10-CM | POA: Diagnosis present

## 2021-08-23 DIAGNOSIS — K862 Cyst of pancreas: Secondary | ICD-10-CM | POA: Diagnosis not present

## 2021-08-23 DIAGNOSIS — R630 Anorexia: Secondary | ICD-10-CM | POA: Diagnosis not present

## 2021-08-23 DIAGNOSIS — Z86711 Personal history of pulmonary embolism: Secondary | ICD-10-CM | POA: Diagnosis not present

## 2021-08-23 DIAGNOSIS — R112 Nausea with vomiting, unspecified: Secondary | ICD-10-CM | POA: Diagnosis not present

## 2021-08-23 DIAGNOSIS — R109 Unspecified abdominal pain: Secondary | ICD-10-CM | POA: Diagnosis present

## 2021-08-23 DIAGNOSIS — K3189 Other diseases of stomach and duodenum: Secondary | ICD-10-CM | POA: Diagnosis not present

## 2021-08-23 DIAGNOSIS — Z79899 Other long term (current) drug therapy: Secondary | ICD-10-CM

## 2021-08-23 DIAGNOSIS — F32A Depression, unspecified: Secondary | ICD-10-CM | POA: Diagnosis present

## 2021-08-23 DIAGNOSIS — M1990 Unspecified osteoarthritis, unspecified site: Secondary | ICD-10-CM | POA: Diagnosis present

## 2021-08-23 DIAGNOSIS — K863 Pseudocyst of pancreas: Secondary | ICD-10-CM | POA: Diagnosis present

## 2021-08-23 DIAGNOSIS — Z981 Arthrodesis status: Secondary | ICD-10-CM

## 2021-08-23 DIAGNOSIS — Z9851 Tubal ligation status: Secondary | ICD-10-CM | POA: Diagnosis not present

## 2021-08-23 DIAGNOSIS — E440 Moderate protein-calorie malnutrition: Secondary | ICD-10-CM | POA: Diagnosis present

## 2021-08-23 DIAGNOSIS — R1013 Epigastric pain: Secondary | ICD-10-CM | POA: Diagnosis present

## 2021-08-23 DIAGNOSIS — R6881 Early satiety: Secondary | ICD-10-CM | POA: Diagnosis present

## 2021-08-23 DIAGNOSIS — F419 Anxiety disorder, unspecified: Secondary | ICD-10-CM | POA: Diagnosis present

## 2021-08-23 DIAGNOSIS — Z91048 Other nonmedicinal substance allergy status: Secondary | ICD-10-CM

## 2021-08-23 DIAGNOSIS — K219 Gastro-esophageal reflux disease without esophagitis: Secondary | ICD-10-CM | POA: Diagnosis present

## 2021-08-23 DIAGNOSIS — K2100 Gastro-esophageal reflux disease with esophagitis, without bleeding: Secondary | ICD-10-CM | POA: Diagnosis not present

## 2021-08-23 DIAGNOSIS — C259 Malignant neoplasm of pancreas, unspecified: Secondary | ICD-10-CM | POA: Diagnosis present

## 2021-08-23 DIAGNOSIS — K2289 Other specified disease of esophagus: Secondary | ICD-10-CM | POA: Diagnosis not present

## 2021-08-23 HISTORY — DX: Pseudocyst of pancreas: K86.3

## 2021-08-23 HISTORY — DX: Malignant neoplasm of pancreas, unspecified: C25.9

## 2021-08-23 LAB — COMPREHENSIVE METABOLIC PANEL
ALT: 20 U/L (ref 0–44)
AST: 12 U/L — ABNORMAL LOW (ref 15–41)
Albumin: 2.7 g/dL — ABNORMAL LOW (ref 3.5–5.0)
Alkaline Phosphatase: 66 U/L (ref 38–126)
Anion gap: 9 (ref 5–15)
BUN: 15 mg/dL (ref 8–23)
CO2: 25 mmol/L (ref 22–32)
Calcium: 8.2 mg/dL — ABNORMAL LOW (ref 8.9–10.3)
Chloride: 108 mmol/L (ref 98–111)
Creatinine, Ser: 0.74 mg/dL (ref 0.44–1.00)
GFR, Estimated: >60 mL/min (ref >60)
Glucose, Bld: 91 mg/dL (ref 70–99)
Potassium: 3.7 mmol/L (ref 3.5–5.1)
Sodium: 142 mmol/L (ref 135–145)
Total Bilirubin: 0.5 mg/dL (ref 0.3–1.2)
Total Protein: 6.2 g/dL — ABNORMAL LOW (ref 6.5–8.1)

## 2021-08-23 LAB — CBC WITH DIFFERENTIAL/PLATELET
Abs Immature Granulocytes: 0.04 10*3/uL (ref 0.00–0.07)
Basophils Absolute: 0 10*3/uL (ref 0.0–0.1)
Basophils Relative: 0 %
Eosinophils Absolute: 0.1 10*3/uL (ref 0.0–0.5)
Eosinophils Relative: 1 %
HCT: 31.8 % — ABNORMAL LOW (ref 36.0–46.0)
Hemoglobin: 10.2 g/dL — ABNORMAL LOW (ref 12.0–15.0)
Immature Granulocytes: 1 %
Lymphocytes Relative: 14 %
Lymphs Abs: 1.2 10*3/uL (ref 0.7–4.0)
MCH: 31.5 pg (ref 26.0–34.0)
MCHC: 32.1 g/dL (ref 30.0–36.0)
MCV: 98.1 fL (ref 80.0–100.0)
Monocytes Absolute: 0.7 10*3/uL (ref 0.1–1.0)
Monocytes Relative: 8 %
Neutro Abs: 6.5 10*3/uL (ref 1.7–7.7)
Neutrophils Relative %: 76 %
Platelets: 323 10*3/uL (ref 150–400)
RBC: 3.24 MIL/uL — ABNORMAL LOW (ref 3.87–5.11)
RDW: 14 % (ref 11.5–15.5)
WBC: 8.6 10*3/uL (ref 4.0–10.5)
nRBC: 0 % (ref 0.0–0.2)

## 2021-08-23 MED ORDER — ONDANSETRON HCL 4 MG/2ML IJ SOLN
4.0000 mg | Freq: Four times a day (QID) | INTRAMUSCULAR | Status: DC | PRN
  Administered 2021-08-23 – 2021-08-28 (×6): 4 mg via INTRAVENOUS
  Filled 2021-08-23 (×7): qty 2

## 2021-08-23 MED ORDER — ACETAMINOPHEN 650 MG RE SUPP: 650.0000 mg | Freq: Four times a day (QID) | RECTAL | Status: DC | PRN

## 2021-08-23 MED ORDER — ESCITALOPRAM OXALATE 20 MG PO TABS
30.0000 mg | ORAL_TABLET | Freq: Every day | ORAL | Status: DC
  Administered 2021-08-23 – 2021-08-30 (×7): 30 mg via ORAL
  Filled 2021-08-23 (×8): qty 1

## 2021-08-23 MED ORDER — MIRTAZAPINE 30 MG PO TABS
30.0000 mg | ORAL_TABLET | Freq: Every day | ORAL | Status: DC
  Administered 2021-08-23 – 2021-08-29 (×7): 30 mg via ORAL
  Filled 2021-08-23 (×7): qty 1

## 2021-08-23 MED ORDER — FENTANYL CITRATE PF 50 MCG/ML IJ SOSY
50.0000 ug | PREFILLED_SYRINGE | INTRAMUSCULAR | Status: DC | PRN
  Administered 2021-08-23 – 2021-08-25 (×4): 50 ug via INTRAVENOUS
  Filled 2021-08-23 (×5): qty 1

## 2021-08-23 MED ORDER — ESCITALOPRAM OXALATE 10 MG PO TABS
15.0000 mg | ORAL_TABLET | Freq: Every day | ORAL | Status: DC
Start: 2021-08-24 — End: 2021-08-26
  Administered 2021-08-25: 15 mg via ORAL
  Filled 2021-08-23: qty 2

## 2021-08-23 MED ORDER — ACETAMINOPHEN 325 MG PO TABS
650.0000 mg | ORAL_TABLET | Freq: Four times a day (QID) | ORAL | Status: DC | PRN
  Administered 2021-08-25 – 2021-08-30 (×5): 650 mg via ORAL
  Filled 2021-08-23 (×5): qty 2

## 2021-08-23 MED ORDER — NALOXONE HCL 0.4 MG/ML IJ SOLN: 0.4000 mg | INTRAMUSCULAR | Status: DC | PRN

## 2021-08-23 MED ORDER — QUETIAPINE FUMARATE 200 MG PO TABS
200.0000 mg | ORAL_TABLET | Freq: Every day | ORAL | Status: DC
  Administered 2021-08-23 – 2021-08-30 (×8): 200 mg via ORAL
  Filled 2021-08-23 (×8): qty 1

## 2021-08-23 NOTE — Progress Notes (Signed)
Received call from [facility] about this patient who is currently at [facility]. Looks like she presented with increasing abdominal pain and nausea and vomiting and had repeat imaging. Concerned that the pseudocyst has continued to increase in size. Unable to transfer or see images unfortunately. Reviewed a recent note from Dr. Zenia Resides that looks like she was planning to monitor this and get the patient started on chemotherapy before decision of whether surgery could be offered or not and potential discussion at a multidisciplinary conference was going to be occurring soon. I told the outside hospital that if the patient were to be transferred I am happy to be available for consultation at either [facility] or [place] long.  I heard from [facility] that it could be a few days before this occurs.  As I am the hospital doctor for the next few days I can be available for potential imaging review and for attempt at cyst drainage, though it looks like based on the MRI done in July this likely is mature enough for a potential cystgastrostomy I will leave it up to the [facility] to discuss with hospital medicine should the patient come and then be available for potential procedures. I will forward this information to her primary GI, her oncologist, her hepatobiliary surgeon.   Justice Britain, MD [facility] Office # [phone]

## 2021-08-24 ENCOUNTER — Inpatient Hospital Stay (HOSPITAL_COMMUNITY): Payer: Medicare Other | Admitting: Hematology

## 2021-08-24 ENCOUNTER — Other Ambulatory Visit: Payer: Self-pay | Admitting: Licensed Clinical Social Worker

## 2021-08-24 ENCOUNTER — Encounter (HOSPITAL_COMMUNITY): Payer: Self-pay | Admitting: Internal Medicine

## 2021-08-24 DIAGNOSIS — C259 Malignant neoplasm of pancreas, unspecified: Secondary | ICD-10-CM | POA: Diagnosis not present

## 2021-08-24 DIAGNOSIS — R112 Nausea with vomiting, unspecified: Secondary | ICD-10-CM

## 2021-08-24 DIAGNOSIS — R1013 Epigastric pain: Secondary | ICD-10-CM | POA: Diagnosis present

## 2021-08-24 DIAGNOSIS — K863 Pseudocyst of pancreas: Secondary | ICD-10-CM | POA: Diagnosis not present

## 2021-08-24 HISTORY — DX: Nausea with vomiting, unspecified: R11.2

## 2021-08-24 LAB — MAGNESIUM: Magnesium: 2.1 mg/dL (ref 1.7–2.4)

## 2021-08-24 LAB — CBC WITH DIFFERENTIAL/PLATELET
Abs Immature Granulocytes: 0.04 10*3/uL (ref 0.00–0.07)
Basophils Absolute: 0 10*3/uL (ref 0.0–0.1)
Basophils Relative: 0 %
Eosinophils Absolute: 0.1 10*3/uL (ref 0.0–0.5)
Eosinophils Relative: 2 %
HCT: 32.5 % — ABNORMAL LOW (ref 36.0–46.0)
Hemoglobin: 10.3 g/dL — ABNORMAL LOW (ref 12.0–15.0)
Immature Granulocytes: 1 %
Lymphocytes Relative: 15 %
Lymphs Abs: 1 10*3/uL (ref 0.7–4.0)
MCH: 31 pg (ref 26.0–34.0)
MCHC: 31.7 g/dL (ref 30.0–36.0)
MCV: 97.9 fL (ref 80.0–100.0)
Monocytes Absolute: 0.6 10*3/uL (ref 0.1–1.0)
Monocytes Relative: 8 %
Neutro Abs: 4.8 10*3/uL (ref 1.7–7.7)
Neutrophils Relative %: 74 %
Platelets: 324 10*3/uL (ref 150–400)
RBC: 3.32 MIL/uL — ABNORMAL LOW (ref 3.87–5.11)
RDW: 14 % (ref 11.5–15.5)
WBC: 6.6 10*3/uL (ref 4.0–10.5)
nRBC: 0 % (ref 0.0–0.2)

## 2021-08-24 LAB — COMPREHENSIVE METABOLIC PANEL
ALT: 20 U/L (ref 0–44)
AST: 13 U/L — ABNORMAL LOW (ref 15–41)
Albumin: 2.5 g/dL — ABNORMAL LOW (ref 3.5–5.0)
Alkaline Phosphatase: 70 U/L (ref 38–126)
Anion gap: 9 (ref 5–15)
BUN: 19 mg/dL (ref 8–23)
CO2: 24 mmol/L (ref 22–32)
Calcium: 8.6 mg/dL — ABNORMAL LOW (ref 8.9–10.3)
Chloride: 105 mmol/L (ref 98–111)
Creatinine, Ser: 0.66 mg/dL (ref 0.44–1.00)
GFR, Estimated: >60 mL/min (ref >60)
Glucose, Bld: 83 mg/dL (ref 70–99)
Potassium: 3.6 mmol/L (ref 3.5–5.1)
Sodium: 138 mmol/L (ref 135–145)
Total Bilirubin: 0.6 mg/dL (ref 0.3–1.2)
Total Protein: 6.2 g/dL — ABNORMAL LOW (ref 6.5–8.1)

## 2021-08-24 LAB — PROTIME-INR
INR: 1.2 (ref 0.8–1.2)
Prothrombin Time: 14.7 seconds (ref 11.4–15.2)

## 2021-08-24 LAB — LIPASE, BLOOD: Lipase: 340 U/L — ABNORMAL HIGH (ref 11–51)

## 2021-08-24 MED ORDER — CALCIUM CARBONATE ANTACID 500 MG PO CHEW
1.0000 | CHEWABLE_TABLET | Freq: Two times a day (BID) | ORAL | Status: DC | PRN
Start: 2021-08-24 — End: 2021-08-31

## 2021-08-24 MED ORDER — PANTOPRAZOLE SODIUM 40 MG IV SOLR
40.0000 mg | INTRAVENOUS | Status: DC
  Administered 2021-08-24 – 2021-08-28 (×5): 40 mg via INTRAVENOUS
  Filled 2021-08-24 (×5): qty 10

## 2021-08-24 MED ORDER — HALOPERIDOL LACTATE 5 MG/ML IJ SOLN: 1.0000 mg | Freq: Four times a day (QID) | INTRAMUSCULAR | Status: DC | PRN

## 2021-08-24 MED ORDER — ORAL CARE MOUTH RINSE: 15.0000 mL | OROMUCOSAL | Status: DC | PRN

## 2021-08-24 MED ORDER — LACTATED RINGERS IV SOLN: INTRAVENOUS | Status: DC

## 2021-08-24 MED ORDER — LACTATED RINGERS IV SOLN: INTRAVENOUS | Status: AC

## 2021-08-24 NOTE — Progress Notes (Signed)
Initial Nutrition Assessment  DOCUMENTATION CODES:   Non-severe (moderate) malnutrition in context of chronic illness  INTERVENTION:   Once diet advanced: -Boost Plus TID, Each supplement provides 360kcal and 14g protein.     If diet unable to be advanced past liquids, agree with GI's recommendations for post-pyloric small bore NGT. Please consult if placed.  NUTRITION DIAGNOSIS:   Moderate Malnutrition related to chronic illness, cancer and cancer related treatments as evidenced by moderate fat depletion, severe muscle depletion.  GOAL:   Patient will meet greater than or equal to 90% of their needs  MONITOR:   PO intake, Supplement acceptance, Labs, Weight trends, I & O's  REASON FOR ASSESSMENT:   Malnutrition Screening Tool    ASSESSMENT:   HPI: Sheryl Bender is a 76 y.o. 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  Patient lying in bed, reports she has no appetite most days. She still makes herself eat 3 meals a day and tries to drink 2 Boosts daily. She is agreeable to having Boost ordered once diet is advanced. Currently says she still is having abdominal pain and some heartburn. She has had increased abdominal pain x 4-5 days. Currently NPO awaiting possible drain.  Per GI note, pt may have partial GOO. May need to consider post-pyloric small bore tube placement for nutrition support.  Per weight records, pt has lost 10 lbs since 1/4 (7% wt loss x 7 months, insignificant for time frame).  Medications: Remeron, Lactated ringers, Zofran  Labs reviewed.  NUTRITION - FOCUSED PHYSICAL EXAM:  Flowsheet Row Most Recent Value  Orbital  Region Moderate depletion  Upper Arm Region Moderate depletion  Thoracic and Lumbar Region Unable to assess  [pain]  Buccal Region Moderate depletion  Temple Region Moderate depletion  Clavicle Bone Region Severe depletion  Clavicle and Acromion Bone Region Severe depletion  Scapular Bone Region Moderate depletion  Dorsal Hand Moderate depletion  Patellar Region Unable to assess  Anterior Thigh Region Unable to assess  Posterior Calf Region Unable to assess  Edema (RD Assessment) None  Hair Reviewed  Eyes Reviewed  Mouth Reviewed  Skin Reviewed       Diet Order:   Diet Order             Diet NPO time specified  Diet effective midnight                   EDUCATION NEEDS:   No education needs have been identified at this time  Skin:  Skin Assessment: Reviewed RN Assessment  Last BM:  PTA  Height:   Ht Readings from Last 1 Encounters:  08/23/21 '5\' 2"'$  (1.575 m)    Weight:   Wt Readings from Last 1 Encounters:  08/24/21 55.7 kg    BMI:  Body mass index is 22.46 kg/m.  Estimated Nutritional Needs:   Kcal:  1650-1850  Protein:  80-90g  Fluid:  1.8L/day   Clayton Bibles, MS, RD, LDN Inpatient Clinical Dietitian Contact information available via Amion

## 2021-08-24 NOTE — Progress Notes (Signed)
PROGRESS NOTE    Jesenia Spera  EZM:[id number]  DOB: June 30, 1945  DOA: 08/23/2021 PCP: Sherrilee Gilles, DO Outpatient Specialists:   Hospital course:  76 year old female with pancreatic CA complicated by pancreatic pseudocyst was admitted last night with abdominal pain and concern for enlarging pancreatic pseudocyst.   Subjective:  Patient states that she feels unwell, still has abdominal pain but it might be somewhat improved, no further nausea or vomiting since last night, has persistent anorexia.   Objective: Vitals:   08/24/21 0500 08/24/21 0512 08/24/21 0920 08/24/21 1430  BP:  (!) 101/58 110/60 117/61  Pulse:  83 84 78  Resp:  '18 16 16  '$ Temp:  98.1 F (36.7 C) 98.5 F (36.9 C) 98.5 F (36.9 C)  TempSrc:  Oral Oral Oral  SpO2:  93% 92% 96%  Weight: 55.7 kg     Height:        Intake/Output Summary (Last 24 hours) at 08/24/2021 1809 Last data filed at 08/24/2021 1542 Gross per 24 hour  Intake 1056.56 ml  Output 650 ml  Net 406.56 ml   Filed Weights   08/23/21 2246 08/24/21 0500  Weight: 55.7 kg 55.7 kg     Exam:  General: Thin elderly female lying in bed looking unwell and tired. Eyes: sclera anicteric, conjuctiva mild injection bilaterally CVS: S1-S2, regular  Respiratory:  decreased air entry bilaterally secondary to decreased inspiratory effort, rales at bases  GI: Nondistended, positive bowel sounds, minimal tenderness to light palpation in the epigastrium and RUQ area.  No guarding.  No rebound. LE: No edema.  Neuro: A/O x 3, Moving all extremities equally with normal strength, CN 3-12 intact, grossly nonfocal.  Psych: patient is logical and coherent, judgement and insight appear normal, mood and affect appropriate to situation.   Assessment & Plan:   Ongoing abdominal pain in setting of pancreatic CA and possible enlarging pseudocyst Appreciate ongoing GI consultation There is some concern that her enlarging pseudocyst may be causing some  gastric outlet obstruction.  Repeat CT scans are being reviewed by GI for further decision making. Possible endoscopic drainage is planned. Trial of clear liquids.  Pancreatic CA T1N0, further treatment pending as an outpatient  Anxiety and depression Continue Lexapro Wellbutrin being held for possible induction of nausea although would have low threshold for restarting once her nausea is resolved.  H/oh PE September 2022 Would have low threshold for initiating Lovenox or heparin for DVT/VTE prophylaxis if no procedure is planned    DVT prophylaxis: SCD however would have low threshold for starting heparin if no procedure is planned given history of PE in September 2022 Code Status: Full Family Communication: None   Scheduled Meds:  escitalopram  15 mg Oral Daily   escitalopram  30 mg Oral Q2000   mirtazapine  30 mg Oral QHS   pantoprazole (PROTONIX) IV  40 mg Intravenous Q24H   QUEtiapine  200 mg Oral QHS   Continuous Infusions:  lactated ringers 100 mL/hr at 08/24/21 1542    Data Reviewed:  Basic Metabolic Panel: Recent Labs  Lab 08/23/21 2052 08/24/21 0522  NA 142 138  K 3.7 3.6  CL 108 105  CO2 25 24  GLUCOSE 91 83  BUN 15 19  CREATININE 0.74 0.66  CALCIUM 8.2* 8.6*  MG  --  2.1    CBC: Recent Labs  Lab 08/23/21 2052 08/24/21 0522  WBC 8.6 6.6  NEUTROABS 6.5 4.8  HGB 10.2* 10.3*  HCT 31.8* 32.5*  MCV 98.1 97.9  PLT 323 324    Studies: No results found.  Principal Problem:   Pancreatic pseudocyst Active Problems:   GERD (gastroesophageal reflux disease)   Depression   Epigastric pain   Nausea & vomiting     Draya Felker [name], [facility]  If 7PM-7AM, please contact night-coverage [email or web address]   LOS: 1 day

## 2021-08-24 NOTE — H&P (Signed)
History and Physical    PLEASE NOTE THAT DRAGON DICTATION SOFTWARE WAS USED IN THE CONSTRUCTION OF THIS NOTE.   Zorah Backes NKN:[id number] DOB: 1946-01-20 DOA: 08/23/2021  PCP: Sherrilee Gilles, DO  Patient coming from: home   I have personally briefly reviewed patient's old medical records in [place]  Chief Complaint: Abdominal pain  HPI: Sheryl Bender is a 76 y.o. 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   Patient with a known history of pancreatic adenocarcinoma for which she follows with Dr. Delton Coombes Of heme-onc at [facility], complicated by pancreatic pseudocyst, for which she follows with Dr. Zenia Resides of General surgery.  Over the last 4 to 5 days, she notes worsening sharp epigastric pain radiating into the left upper quadrant, worsening with palpation over the abdomen.  She notes poor associated pain control with outpatient prn oxycodone IR, and is noted associated intermittent nausea resulting in at least 3-4 episodes of nonbloody, nonbilious emesis over the last few days, which she notes is further impairing her ability to pursue oral analgesia at this time.  Denies any associated new onset diarrhea, melena, hematochezia.  No associated any subjective fever, chills, rigors, or generalized myalgias.  No recent chest pain, shortness of breath, cough.  Denies any dysuria or gross hematuria.  Not on any blood thinners as an outpatient, including no aspirin.    Mindi Junker ED Course:  Per EDP at [facility] [place], Conveyed that updated CT abdomen/pelvis showed enlarging pancreatic pseudocyst, now measuring 8 x 6 x 11 cm relative to  most previous dimensions of 6 x 10 cm.  Subsequently, Dr. Tamala Julian discussed patient's case and imaging with on-call gastroenterology, Dr. Rush Landmark , Who conveyed that he is amenable to consulting regarding potential drainage of the pseudocyst.  The patient was subsequently excepted by the hospitalist for transfer to [facility] long for further evaluation and management of enlarging pseudocyst resulting in poorly controlled abdominal discomfort complicated by intractable nausea/vomiting.   Review of Systems: As per HPI otherwise 10 point review of systems negative.   Past Medical History:  Diagnosis Date   Anxiety    Arthritis    Depression    Dysrhythmia    hx palpitations greater than 5 yrs -neg echo, stress per pt ? where or dr   GERD (gastroesophageal reflux disease)    occ   Pancreatic adenocarcinoma ([place])    Pancreatic pseudocyst    Pulmonary embolism [facility])    September 2022    Past Surgical History:  Procedure Laterality Date   ANTERIOR LAT LUMBAR FUSION Right 12/16/2015   Procedure: RIGHT LUMBAR TWO-THREE, LUMBAR THREE-FOUR, LUMBAR FOUR-FIVE ANTEROLATERAL LUMBAR INTERBODY FUSION;  Surgeon: Erline Levine, MD;  Location: [place];  Service: Neurosurgery;  Laterality: Right;   BIOPSY  07/09/2021   Procedure: BIOPSY;  Surgeon: Rush Landmark Telford Nab., MD;  Location: [place];  Service: Gastroenterology;;   ESOPHAGOGASTRODUODENOSCOPY N/A 07/09/2021   Procedure: ESOPHAGOGASTRODUODENOSCOPY (EGD);  Surgeon: Irving Copas., MD;  Location: [place];  Service: Gastroenterology;  Laterality: N/A;   EUS N/A 07/09/2021   Procedure: UPPER ENDOSCOPIC ULTRASOUND (EUS) RADIAL;  Surgeon: Irving Copas., MD;  Location: [place];  Service: Gastroenterology;  Laterality: N/A;   FINE NEEDLE ASPIRATION  07/09/2021  Procedure: FINE NEEDLE ASPIRATION (FNA) LINEAR;  Surgeon: Irving Copas., MD;  Location: [place];  Service: Gastroenterology;;   LUMBAR PERCUTANEOUS PEDICLE  SCREW 3 LEVEL Bilateral 12/16/2015   Procedure: PERCUTANEOUS PEDICLE SCREWS BILATERALLY AT LUMBAR TWO-FIVE;  Surgeon: Erline Levine, MD;  Location: [place];  Service: Neurosurgery;  Laterality: Bilateral;   TUBAL LIGATION  1974    Social History:  reports that she has never smoked. She has never used smokeless tobacco. She reports that she does not drink alcohol and does not use drugs.   Allergies  Allergen Reactions   Other Hives    Cherry wood just cut- "smelled it and broke out"      [employer]    Family History  Problem Relation Age of Onset   Pancreatitis Brother        alcoholic pancreatitis   Pancreatic cancer Neg Hx    Colon cancer Neg Hx     Family history reviewed and not pertinent    Prior to Admission medications   Medication Sig Start Date End Date Taking? Authorizing Provider  acetaminophen (TYLENOL) 500 MG tablet Take 2 tablets (1,000 mg total) by mouth every 8 (eight) hours as needed for moderate pain, headache or fever. 07/21/21   Barton Dubois, MD  buPROPion (WELLBUTRIN XL) 300 MG 24 hr tablet Take 300 mg by mouth daily. 06/15/21   [provider]  busPIRone (BUSPAR) 10 MG tablet Take 20 mg by mouth 2 (two) times daily. 01/27/21   [provider]  Cholecalciferol (VITAMIN D) 125 MCG (5000 UT) CAPS Take 5,000 Units by mouth every other day.    [provider]  Cholecalciferol (VITAMIN D3) 10 MCG (400 UNIT) tablet Take by mouth. 04/20/21   [provider]  escitalopram (LEXAPRO) 20 MG tablet Take 30 mg by mouth See admin instructions. Take 1/2 tablet in the morning and 1 tablet at 830pm    [provider]  esomeprazole (NEXIUM) 40 MG capsule Take 1 capsule (40 mg total) by mouth daily. Patient taking differently: Take 20 mg by mouth daily. 07/09/21 07/09/22  Mansouraty, Telford Nab., MD  feeding supplement (ENSURE ENLIVE / ENSURE PLUS) LIQD Take 237 mLs by mouth 2 (two) times daily between meals. 07/22/21   Barton Dubois, MD   linaclotide [facility]) 72 MCG capsule Take 1 capsule (72 mcg total) by mouth daily before breakfast. 07/22/21   Barton Dubois, MD  metoprolol succinate (TOPROL-XL) 25 MG 24 hr tablet Take 25 mg by mouth daily.    [provider]  mirtazapine (REMERON) 30 MG tablet Take 30 mg by mouth at bedtime. 06/15/21   [provider]  ondansetron (ZOFRAN-ODT) 8 MG disintegrating tablet Take 1 tablet (8 mg total) by mouth every 8 (eight) hours as needed for nausea or vomiting. 07/21/21   Barton Dubois, MD  oxyCODONE (OXY IR/ROXICODONE) 5 MG immediate release tablet Take 1 tablet (5 mg total) by mouth every 6 (six) hours as needed for severe pain. 07/21/21   Barton Dubois, MD  polyethylene glycol (MIRALAX / GLYCOLAX) 17 g packet Take 17 g by mouth daily. 07/22/21   Barton Dubois, MD  QUEtiapine (SEROQUEL) 200 MG tablet Take 200 mg by mouth at bedtime. 03/27/21   [provider]  QUEtiapine (SEROQUEL) 50 MG tablet Take 25 mg by mouth 3 (three) times daily. 04/20/21   [provider]  vitamin B-12 (CYANOCOBALAMIN) 500 MCG tablet Take 1 tablet (500 mcg total) by mouth daily. 07/22/21   Barton Dubois, MD  Objective    Physical Exam: Vitals:   08/23/21 2019 08/23/21 2211 08/23/21 2246 08/24/21 0028  BP: (!) 117/55   (!) 97/54  Pulse: 78   75  Resp: 18   18  Temp: 98.5 F (36.9 C)   98.5 F (36.9 C)  TempSrc: Oral   Oral  SpO2: 96%   92%  Weight:   55.7 kg   Height:  '5\' 2"'$  (1.575 m)      General: appears to be stated age; alert, oriented Skin: warm, dry, no rash Head:  AT/[place] Mouth:  Oral mucosa membranes appear dry, normal dentition Neck: supple; trachea midline Heart:  RRR; did not appreciate any M/R/G Lungs: CTAB, did not appreciate any wheezes, rales, or rhonchi Abdomen: + BS; soft, ND, tenderness over the epigastrium/left upper quadrant, in the absence of any associated guarding, rigidity, or rebound tenderness. Vascular: 2+ pedal pulses b/l; 2+ radial pulses  b/l Extremities: no peripheral edema, no muscle wasting Neuro: strength and sensation intact in upper and lower extremities b/l    Labs on Admission: I have personally reviewed following labs and imaging studies  CBC: Recent Labs  Lab 08/23/21 2052  WBC 8.6  NEUTROABS 6.5  HGB 10.2*  HCT 31.8*  MCV 98.1  PLT 093   Basic Metabolic Panel: Recent Labs  Lab 08/23/21 2052  NA 142  K 3.7  CL 108  CO2 25  GLUCOSE 91  BUN 15  CREATININE 0.74  CALCIUM 8.2*   GFR: Estimated Creatinine Clearance: 46.6 mL/min (by C-G formula based on SCr of 0.74 mg/dL). Liver Function Tests: Recent Labs  Lab 08/23/21 2052  AST 12*  ALT 20  ALKPHOS 66  BILITOT 0.5  PROT 6.2*  ALBUMIN 2.7*   No results for input(s): "LIPASE", "AMYLASE" in the last 168 hours. No results for input(s): "AMMONIA" in the last 168 hours. Coagulation Profile: No results for input(s): "INR", "PROTIME" in the last 168 hours. Cardiac Enzymes: No results for input(s): "CKTOTAL", "CKMB", "CKMBINDEX", "TROPONINI" in the last 168 hours. BNP (last 3 results) No results for input(s): "PROBNP" in the last 8760 hours. HbA1C: No results for input(s): "HGBA1C" in the last 72 hours. CBG: No results for input(s): "GLUCAP" in the last 168 hours. Lipid Profile: No results for input(s): "CHOL", "HDL", "LDLCALC", "TRIG", "CHOLHDL", "LDLDIRECT" in the last 72 hours. Thyroid Function Tests: No results for input(s): "TSH", "T4TOTAL", "FREET4", "T3FREE", "THYROIDAB" in the last 72 hours. Anemia Panel: No results for input(s): "VITAMINB12", "FOLATE", "FERRITIN", "TIBC", "IRON", "RETICCTPCT" in the last 72 hours. Urine analysis: No results found for: "COLORURINE", "APPEARANCEUR", "LABSPEC", "PHURINE", "GLUCOSEU", "HGBUR", "BILIRUBINUR", "KETONESUR", "PROTEINUR", "UROBILINOGEN", "NITRITE", "LEUKOCYTESUR"  Radiological Exams on Admission: No results found.   Assessment/Plan    Principal Problem:   Pancreatic  pseudocyst Active Problems:   GERD (gastroesophageal reflux disease)   Depression   Epigastric pain   Nausea & vomiting     #) Enlarging pancreatic pseudocyst: In the context of a history of pancreatic adenocarcinoma, dimensions of the patient's known pancreatic pseudocyst increasing per updated CT scan on 08/23/2021, as further quantified above, which appears to be associated with the patient's presenting worsening abdominal discomfort with suboptimal outpatient pain control has been further complicated by intractable nausea/vomiting.  As noted above, EDP at [place] patient's case and imaging with on-call gastroenterology, Dr. Rush Landmark , Who conveyed that he is amenable to consulting on this patient regarding potential drainage of enlarging pseudocyst..   Plan: GI consult, as above.  N.p.o. after midnight to  leave given the opportunity for drainage of pancreatic pseudocyst.  Check INR.  CMP/CBC in the morning.  Prn IV fentanyl.  Prn IV Zofran.  Continuous LR at 100 cc/h x 8 hours.  Check lipase.           #) GERD: documented h/o such; on Nexium as outpatient.   Plan: In the setting of n.p.o. status, will initiate Protonix 40 mg IV daily.         #) Depression: Documented history of such, on Lexapro and Wellbutrin as an outpatient.  Given the pro-dopaminergic nature of Wellbutrin, with associated increased risk for induction of nausea, will hold home Wellbutrin for now while continuing Lexapro in the setting of element of intractable nausea/vomiting over the last few days.  Plan: Hold Wellbutrin, and continue home Lexapro, as above.       DVT prophylaxis: SCD's   Code Status: Full code Family Communication: none Disposition Plan: Per Rounding Team Consults called: EDP at [facility] discussed patient's case with on-call GI, as further detailed above;  Admission status: Inpatient   PLEASE NOTE THAT DRAGON DICTATION SOFTWARE WAS USED IN THE  CONSTRUCTION OF THIS NOTE.   [place] DO [facility] [place]   08/24/2021, 5:06 AM

## 2021-08-24 NOTE — Consult Note (Addendum)
Consultation  Referring Provider:   [facility] Primary Care Physician:  Sherrilee Gilles, DO Primary Gastroenterologist:  Dr. Gala Romney (consulted Mansouraty for EUS)       Reason for Consultation:     Increasing pancreatic pseudocyst, epigastric pain with nausea and vomiting   Impression    Pancreatic adenocarcinoma EUS was completed on 07/09/21, and showed a stricture near the genu of the pancreas, but no discrete mass or malignant-appearing lymph nodes. This was staged as a uT1N0, and FNA of this area did confirm adenocarcinoma following with Dr. Zenia Resides for possible hepatobiliary surgery, Dr. 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 Pending repeat CT from from so above her Dr. Rush Landmark are endoscopic specialist to review-called [facility] and have requested they loaded most recent CT results from [place] patient is having some partial gastric outlet obstruction from this mass if able Dr. Rush Landmark will plan on endoscopic draining if amendable. Can consider see how patient is doing with clear liquids, if patient is unable to tolerate this consider postpyloric tube feeding/TPN if prolonged NPO  GERD On Protonix outpatient  mild protein calorie malnutrition Albumin 08/24/2021  2.5  BMI body mass index is 22.46 kg/m.  Secondary to  carcinoma - RD consult Count calories, increase protein We will need to think long-term for patients that needs nutritional replacement with possible chemotherapy    Plan   Continue PPI, can increase to BID and change to IV Daily CMET, CBC -Pain control per the inpatient medical team. -Encourage early ambulation -recommend consideration of nutritional support with a nasogastric or nasojejunal postpyloric feedings/TPN if patient will be NPO/unable to tolerate   [facility] course will depend on if pseudocyst appears amendable to endoscopic drainage.  If unable can consider IR or surgical referral.  Thank you for your kind consultation, we will continue to follow.         HPI:   Sheryl Bender is a 76 y.o. female with past medical history significant for depression, anxiety, GERD, pulmonary embolism 2022 on Eliquis.  Patient had PE September 2022 found to have abnormal CT scan of pancreatic ductal dilatation with irregular opacities.  Repeat CT scan 12/17/2020 showed fat atrophy of the pancreas, pancreatic ductal dilatation from level of pancreatic neck to the tail.  Saw Dr. Roseanne Kaufman PA on 01/28/2021. 07/09/2021 EUS with Dr. Rush Landmark no gross lesions in the esophagus grade B esophagitis, salmon-colored mucosal islands suspicious for Barrett's, 3 cm hiatal hernia, gastritis, normal duodenal bulb.  Pathology negative for H. pylori, atrophy, Barrett's EUS showed PD strictured hypoechoic mass versus chronic pancreatic changes chronic pancreatitis versus possibility of malignancy no malignant appearing lymph nodes.  Fine-needle biopsy came back positive for pancreatic adenocarcinoma.  Had subsequent pancreatitis from procedure. 08/04/2021 proceeded with MRCP to evaluate further.  Abrupt transition from dilated to [facility] compressed duct and pancreatic head no defined underlying mass.  Enlargement since 07/13/2021 of presumed pseudocyst position between the pancreatic head and gastro duodenal junction significant mass effect upon the pyloric region and gastric outlet obstruction should be clinically excluded.  Well-defined retrogastric edema fluid suggest ongoing pancreatitis no evidence of abdominal metastatic disease tiny pleural effusions. Has been following with Dr. Zenia Resides for possible hepatobiliary surgery, Dr. Delton Coombes with oncology, was being monitored and planning on starting chemotherapy before decision of whether or not surgery would be offered at  multidisciplinary  conference.  Patient states over the past 2 weeks has had about a 5 pound weight loss decreased appetite. Saturday night had woke up at 2 AM with epigastric pain no radiation to her back, nausea and vomiting.  A few nights prior to that had some mild epigastric pain but was able to go to sleep.  Otherwise denies any further issues in health. Does have history of constipation and has been put on Linzess and MiraLAX, has now been having diarrhea last bowel movement was Friday.  She has been decreasing passing gas and having abdominal bloating, has been having a lot of belching with nausea and vomiting. Patient denies melena, hematochezia.   Denies yellowing of skin or eyes, clay colored stool, dark urine, lower extremity edema. Had 1 episode of nonexertional chest pain with radiation to her neck with associated epigastric pain denies shortness of breath, diaphoresis. Denies NSAIDs, alcohol.   Abnormal ED labs: Abnormal Labs Reviewed  COMPREHENSIVE METABOLIC PANEL - Abnormal; Notable for the following components:      Result Value   Calcium 8.6 (*)    Total Protein 6.2 (*)    Albumin 2.5 (*)    AST 13 (*)    All other components within normal limits  CBC WITH DIFFERENTIAL/PLATELET - Abnormal; Notable for the following components:   RBC 3.32 (*)    Hemoglobin 10.3 (*)    HCT 32.5 (*)    All other components within normal limits  LIPASE, BLOOD - Abnormal; Notable for the following components:   Lipase 340 (*)    All other components within normal limits  COMPREHENSIVE METABOLIC PANEL - Abnormal; Notable for the following components:   Calcium 8.2 (*)    Total Protein 6.2 (*)    Albumin 2.7 (*)    AST 12 (*)    All other components within normal limits  CBC WITH DIFFERENTIAL/PLATELET - Abnormal; Notable for the following components:   RBC 3.24 (*)    Hemoglobin 10.2 (*)    HCT 31.8 (*)    All other components within normal limits     Past Medical History:   Diagnosis Date   Anxiety    Arthritis    Depression    Dysrhythmia    hx palpitations greater than 5 yrs -neg echo, stress per pt ? where or dr   GERD (gastroesophageal reflux disease)    occ   Pancreatic adenocarcinoma [facility])    Pancreatic pseudocyst    Pulmonary embolism [facility])    September 2022    Surgical History:  She  has a past surgical history that includes Tubal ligation (1974); Anterior lat lumbar fusion (Right, 12/16/2015); Lumbar percutaneous pedicle screw 3 level (Bilateral, 12/16/2015); EUS (N/A, 07/09/2021); Esophagogastroduodenoscopy (N/A, 07/09/2021); biopsy (07/09/2021); and Fine needle aspiration (07/09/2021). Family History:  Her family history includes Pancreatitis in her brother. Social History:   reports that she has never smoked. She has never used smokeless tobacco. She reports that she does not drink alcohol and does not use drugs.  Prior to Admission medications   Medication Sig Start Date End Date Taking? Authorizing Provider  acetaminophen (TYLENOL) 500 MG tablet Take 2 tablets (1,000 mg total) by mouth every 8 (eight) hours as needed for moderate pain, headache or fever. 07/21/21   Barton Dubois, MD  buPROPion (WELLBUTRIN XL) 300 MG 24 hr tablet Take 300 mg by mouth daily. 06/15/21   [provider]  busPIRone (BUSPAR) 10 MG tablet Take 20 mg by mouth 2 (two) times daily. 01/27/21  [provider]  Cholecalciferol (VITAMIN D) 125 MCG (5000 UT) CAPS Take 5,000 Units by mouth every other day.    [provider]  Cholecalciferol (VITAMIN D3) 10 MCG (400 UNIT) tablet Take by mouth. 04/20/21   [provider]  escitalopram (LEXAPRO) 20 MG tablet Take 30 mg by mouth See admin instructions. Take 1/2 tablet in the morning and 1 tablet at 830pm    [provider]  esomeprazole (NEXIUM) 40 MG capsule Take 1 capsule (40 mg total) by mouth daily. Patient taking differently: Take 20 mg by mouth daily. 07/09/21 07/09/22  Mansouraty,  Telford Nab., MD  feeding supplement (ENSURE ENLIVE / ENSURE PLUS) LIQD Take 237 mLs by mouth 2 (two) times daily between meals. 07/22/21   Barton Dubois, MD  linaclotide [facility]) 72 MCG capsule Take 1 capsule (72 mcg total) by mouth daily before breakfast. 07/22/21   Barton Dubois, MD  metoprolol succinate (TOPROL-XL) 25 MG 24 hr tablet Take 25 mg by mouth daily.    [provider]  mirtazapine (REMERON) 30 MG tablet Take 30 mg by mouth at bedtime. 06/15/21   [provider]  ondansetron (ZOFRAN-ODT) 8 MG disintegrating tablet Take 1 tablet (8 mg total) by mouth every 8 (eight) hours as needed for nausea or vomiting. 07/21/21   Barton Dubois, MD  oxyCODONE (OXY IR/ROXICODONE) 5 MG immediate release tablet Take 1 tablet (5 mg total) by mouth every 6 (six) hours as needed for severe pain. 07/21/21   Barton Dubois, MD  polyethylene glycol (MIRALAX / GLYCOLAX) 17 g packet Take 17 g by mouth daily. 07/22/21   Barton Dubois, MD  QUEtiapine (SEROQUEL) 200 MG tablet Take 200 mg by mouth at bedtime. 03/27/21   [provider]  QUEtiapine (SEROQUEL) 50 MG tablet Take 25 mg by mouth 3 (three) times daily. 04/20/21   [provider]  vitamin B-12 (CYANOCOBALAMIN) 500 MCG tablet Take 1 tablet (500 mcg total) by mouth daily. 07/22/21   Barton Dubois, MD    Current Facility-Administered Medications  Medication Dose Route Frequency Provider Last Rate Last Admin   acetaminophen (TYLENOL) tablet 650 mg  650 mg Oral Q6H PRN Howerter, Justin B, DO       Or   acetaminophen (TYLENOL) suppository 650 mg  650 mg Rectal Q6H PRN Howerter, Justin B, DO       calcium carbonate (TUMS - dosed in mg elemental calcium) chewable tablet 200 mg of elemental calcium  1 tablet Oral BID PRN Vashti Hey, MD       escitalopram (LEXAPRO) tablet 15 mg  15 mg Oral Daily Howerter, Justin B, DO       escitalopram (LEXAPRO) tablet 30 mg  30 mg Oral Q2000 Howerter, Justin B, DO   30 mg at  08/23/21 2219   fentaNYL (SUBLIMAZE) injection 50 mcg  50 mcg Intravenous Q2H PRN Howerter, Justin B, DO   50 mcg at 08/23/21 2220   lactated ringers infusion   Intravenous Continuous Howerter, Justin B, DO 100 mL/hr at 08/24/21 0528 New Bag at 08/24/21 0528   mirtazapine (REMERON) tablet 30 mg  30 mg Oral QHS Howerter, Justin B, DO   30 mg at 08/23/21 2235   naloxone [facility]) injection 0.4 mg  0.4 mg Intravenous PRN Howerter, Justin B, DO       ondansetron (ZOFRAN) injection 4 mg  4 mg Intravenous Q6H PRN Howerter, Justin B, DO   4 mg at 08/24/21 0433   Oral care mouth rinse  15 mL Mouth Rinse PRN Vashti Hey, MD       pantoprazole (PROTONIX) injection 40 mg  40 mg Intravenous Q24H Howerter, Justin B, DO   40 mg at 08/24/21 4196   QUEtiapine (SEROQUEL) tablet 200 mg  200 mg Oral QHS Howerter, Justin B, DO   200 mg at 08/23/21 2235    Allergies as of 08/23/2021 - Review Complete 08/23/2021  Allergen Reaction Noted   Other Hives 12/08/2015   Marcelline Mates Hives 03/30/2021    Review of Systems:    Constitutional: No weight loss, fever, chills, weakness or fatigue HEENT: Eyes: No change in vision               Ears, Nose, Throat:  No change in hearing or congestion Skin: No rash or itching Cardiovascular: No chest pain, chest pressure or palpitations   Respiratory: No SOB or cough Gastrointestinal: See HPI and otherwise negative Genitourinary: No dysuria or change in urinary frequency Neurological: No headache, dizziness or syncope Musculoskeletal: No new muscle or joint pain Hematologic: No bleeding or bruising Psychiatric: No history of depression or anxiety     Physical Exam:  Vital signs in last 24 hours: Temp:  [98.1 F (36.7 C)-98.5 F (36.9 C)] 98.1 F (36.7 C) (07/31 0512) Pulse Rate:  [75-83] 83 (07/31 0512) Resp:  [18] 18 (07/31 0512) BP: (97-117)/(54-58) 101/58 (07/31 0512) SpO2:  [92 %-96 %] 93 % (07/31 0512) Weight:  [55.7 kg] 55.7 kg (07/31 0500)   Last  BM recorded by nurses in past 5 days No data recorded  General:   Thin elderly chronically ill appearing female in no acute distress Head:  Normocephalic and atraumatic. Eyes: sclerae anicteric,conjunctive pink  Heart:  regular rate and rhythm Pulm: Clear anteriorly; no wheezing Abdomen:  Soft, Non-distended AB, Sluggish bowel sounds. mild tenderness in the epigastrium and in the RUQ with a fullness palpated Without guarding and Without rebound, No organomegaly appreciated. Extremities:  Without edema. Msk:  Symmetrical without gross deformities. Peripheral pulses intact.  Neurologic:  Alert and  oriented x4;  No focal deficits.  Skin:   Dry and intact without significant lesions or rashes. Psychiatric:  Cooperative. Normal mood and affect.  LAB RESULTS: Recent Labs    08/23/21 2052 08/24/21 0522  WBC 8.6 6.6  HGB 10.2* 10.3*  HCT 31.8* 32.5*  PLT 323 324   BMET Recent Labs    08/23/21 2052 08/24/21 0522  NA 142 138  K 3.7 3.6  CL 108 105  CO2 25 24  GLUCOSE 91 83  BUN 15 19  CREATININE 0.74 0.66  CALCIUM 8.2* 8.6*   LFT Recent Labs    08/24/21 0522  PROT 6.2*  ALBUMIN 2.5*  AST 13*  ALT 20  ALKPHOS 70  BILITOT 0.6   PT/INR Recent Labs    08/24/21 0522  LABPROT 14.7  INR 1.2    STUDIES: No results found.   Vladimir Crofts  08/24/2021, 9:01 AM  -----------------------------------------------------------------------------------  I have taken a history, reviewed the chart and examined the patient. I performed a substantive portion of this encounter, including complete performance of at least one of the key components, in conjunction with the APP. I agree with the APP's note, impression and recommendations  76 year old female with recently diagnosed localized pancreatic cancer, with post-EUS pancreatitis complicated by pseudocyst formation, admitted with abdominal pain and nausea/vomiting, reportedly found to have enlarging pseudocyst with mass effect on  the stomach.  The patient had been eating  a regular diet (low fat, high protein) the day up to her presentation.  She admits to forcing herself to eat because she had decreased appetite, but denied severe post-prandial abdominal pain or nausea/vomiting until last night.  She currently feels and is not in significant discomfort, but continues to report poor appetite. I have not been able to view images from the outside ED as of this moment. Dr. Rush Landmark is also following the patient remotely and will review the images and determine whether the pseudocyst is yet amenable for drainage. For now, I think a clear liquid diet today is reasonable.  She will need to be NPO for at least 2 hours before a EUS/cystgastrostomy; will hopefully be able to make that determination tomorrow.  Medford Staheli E. Candis Schatz, MD [facility]

## 2021-08-25 DIAGNOSIS — K863 Pseudocyst of pancreas: Secondary | ICD-10-CM | POA: Diagnosis not present

## 2021-08-25 DIAGNOSIS — E440 Moderate protein-calorie malnutrition: Secondary | ICD-10-CM | POA: Insufficient documentation

## 2021-08-25 LAB — CBC
HCT: 30.9 % — ABNORMAL LOW (ref 36.0–46.0)
Hemoglobin: 10 g/dL — ABNORMAL LOW (ref 12.0–15.0)
MCH: 31.5 pg (ref 26.0–34.0)
MCHC: 32.4 g/dL (ref 30.0–36.0)
MCV: 97.5 fL (ref 80.0–100.0)
Platelets: 281 10*3/uL (ref 150–400)
RBC: 3.17 MIL/uL — ABNORMAL LOW (ref 3.87–5.11)
RDW: 13.7 % (ref 11.5–15.5)
WBC: 4.5 10*3/uL (ref 4.0–10.5)
nRBC: 0 % (ref 0.0–0.2)

## 2021-08-25 LAB — COMPREHENSIVE METABOLIC PANEL
ALT: 19 U/L (ref 0–44)
AST: 15 U/L (ref 15–41)
Albumin: 2.6 g/dL — ABNORMAL LOW (ref 3.5–5.0)
Alkaline Phosphatase: 63 U/L (ref 38–126)
Anion gap: 10 (ref 5–15)
BUN: 17 mg/dL (ref 8–23)
CO2: 25 mmol/L (ref 22–32)
Calcium: 8.5 mg/dL — ABNORMAL LOW (ref 8.9–10.3)
Chloride: 107 mmol/L (ref 98–111)
Creatinine, Ser: 0.56 mg/dL (ref 0.44–1.00)
GFR, Estimated: >60 mL/min (ref >60)
Glucose, Bld: 73 mg/dL (ref 70–99)
Potassium: 3.9 mmol/L (ref 3.5–5.1)
Sodium: 142 mmol/L (ref 135–145)
Total Bilirubin: 0.6 mg/dL (ref 0.3–1.2)
Total Protein: 5.9 g/dL — ABNORMAL LOW (ref 6.5–8.1)

## 2021-08-25 NOTE — Progress Notes (Addendum)
[facility] Progress Note  CC:   Increasing pancreatic pseudocyst, epigastric pain with nausea and vomiting  Subjective: She vomited nonbloody yellow emesis x 1 this morning. She continues to have waves of central abdominal pain. She feels too lethargic after taking pain meds.  She is taking sips of clear liquids.  No bowel movement x 3 days.  No gas per the rectum today.  No chest pain or shortness of breath.  No family at the bedside.  Objective:   CTAP at [facility] 08/23/2021:  CT films requested, to be uploaded to [facility] radiology  Abdominal MRI 08/03/2021 with and without contrast: 1. Similar appearance of the pancreas, with abrupt transition from dilated to decompressed duct in the pancreatic head. No well-defined underlying mass identified. 2. Enlargement since 07/13/2021 of a presumed pseudocyst positioned between the pancreatic head and gastroduodenal junction. This causes significant mass-effect upon the pyloric region and gastric outlet obstruction should be clinically excluded. 3. More ill-defined retro gastric edema/fluid suggests ongoing pancreatitis. 4. No evidence of abdominal metastatic disease. 5. Tiny bilateral pleural effusions  CTAP 07/13/2021: Lower chest: Lung bases are clear.   Hepatobiliary: Small cyst in the RIGHT hepatic lobe. No biliary duct dilatation. The gallbladder is collapsed. Common bile duct normal caliber.   Pancreas: There is duct dilatation in the mid body and tail of the pancreas. There is abrupt termination of this duct dilatation in the head of the pancreas (images 18-23 of series 2). These findings are similar to comparison MRI. There is however a new elongated fluid collection extending from just above the junction of the stomach and duodenum continuing inferiorly to the greater curvature of stomach. The collection is just ventral to the head of the pancreas. This collection measures 4.0 x 3.1 cm in  axial dimension (image 28/2) and 7.9 cm in craniocaudad dimension (image 23.5). This elongated collection has appearance of the pancreatic pseudocysts.   Spleen: Normal spleen   Adrenals/urinary tract: Adrenal glands normal. Bilateral parapelvic cysts renal hilum. Ureters and bladder normal.   Stomach/Bowel: Stomach is normal. There is compression of the antrum the stomach by the fluid collection described the pancreatic section. Duodenum small-bowel normal. No bowel obstruction. Normal colon. Rectum normal.   Vascular/Lymphatic: Abdominal aorta is normal caliber with atherosclerotic calcification. There is no retroperitoneal or periportal lymphadenopathy. No pelvic lymphadenopathy.   Reproductive: Uterus normal   Other: No free fluid.   Musculoskeletal: No aggressive osseous lesion   IMPRESSION: Number   1. New fluid collection extending posterior to the gastric antrum and ventral to the pancreas. This elongated collection extends to the greater curvature of the stomach with inflammation surrounding the collection. 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 2. Chronic dilatation of the pancreatic duct with abrupt termination of the duct dilatation in the head of the pancreas. Findings are not changed from comparison MRI of 02/06/2021. Differential include benign and malignant stricturing of the pancreatic duct. 3. Normal gallbladder.  No biliary duct dilatation    Vital signs in last 24 hours: Temp:  [97.5 F (36.4 C)-99.7 F (37.6 C)] 99.7 F (37.6 C) (08/01 0644) Pulse Rate:  [73-81] 79 (08/01 0644) Resp:  [16-18] 16 (08/01 0644) BP: (117-125)/(59-68) 125/60 (08/01 0644) SpO2:  [90 %-96 %] 90 % (08/01 0644) Weight:  [56 kg] 56 kg (08/01 0500)   General: Fatigued appearing 76 year old female in no acute distress. Heart: Regular rate and rhythm, no murmurs Pulm: Breath sounds clear throughout. Abdomen: Soft, mild  upper abdominal distention with  tenderness to the epigastric area without rebound or guarding.  Hypoactive bowel sounds x 4 quads. Extremities:  Without edema. Neurologic:  Alert and  oriented x 4.  Speech is clear.  Moves all extremities. Psych:  Alert and cooperative. Normal mood and affect.  Intake/Output from previous day: 07/31 0701 - 08/01 0700 In: 2037.5 [I.V.:2037.5] Out: 850 [Urine:850] Intake/Output this shift: Total I/O In: -  Out: 100 [Emesis/NG output:100]  Lab Results: Recent Labs    08/23/21 2052 08/24/21 0522 08/25/21 0445  WBC 8.6 6.6 4.5  HGB 10.2* 10.3* 10.0*  HCT 31.8* 32.5* 30.9*  PLT 323 324 281   BMET Recent Labs    08/23/21 2052 08/24/21 0522 08/25/21 0445  NA 142 138 142  K 3.7 3.6 3.9  CL 108 105 107  CO2 '25 24 25  '$ GLUCOSE 91 83 73  BUN '15 19 17  '$ CREATININE 0.74 0.66 0.56  CALCIUM 8.2* 8.6* 8.5*   LFT Recent Labs    08/25/21 0445  PROT 5.9*  ALBUMIN 2.6*  AST 15  ALT 19  ALKPHOS 63  BILITOT 0.6   PT/INR Recent Labs    08/24/21 0522  LABPROT 14.7  INR 1.2   Hepatitis Panel No results for input(s): "HEPBSAG", "HCVAB", "HEPAIGM", "HEPBIGM" in the last 72 hours.  No results found.  Assessment / Plan:  56) 76 year old female with pancreatic adenocarcinoma S/P EUS 07/09/2021, FNA confirmed adenocarcinoma. 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  Followed by Dr. Zenia Resides for possible hepatobiliary surgery and Dr. Delton Coombes with oncology. She was admitted to [facility] 08/23/2021 with worsening N/V and abdominal pain. Repeat CT showed enlargement of her pseudocyst with mass effect. Transferred to [facility] 08/24/2021 for possible cystogastrostomy. CTAP films from [facility] requested, not yet uploaded.  -Dr. Rush Landmark to review [place] CT films to verify if pseudocyst amenable for possible cystgastrostomy Repeat CTAP with IV contrast today if [facility] CT films not received within the next few hours -Zofran 4 mg IV every 6  hours as needed -Pain management per the hospitalist -Continue IV fluid -Continue clear liquid diet   ADDENDUM: Dr. Rush Landmark reviewed CT imaging from [facility]. EUS with gastrostomy scheduled tomorrow. NPO after midnight.   2) GERD -Continue pantoprazole 40 mg IV every 24 hours  3) PE 09/2020  Principal Problem:   Pancreatic pseudocyst Active Problems:   GERD (gastroesophageal reflux disease)   Depression   Epigastric pain   Nausea & vomiting     LOS: 2 days   Noralyn Pick  08/25/2021, 11:20AM  ------------------------------------------------------------ I have taken a history, reviewed the chart and examined the patient. I performed a substantive portion of this encounter, including complete performance of at least one of the key components, in conjunction with the APP. I agree with the APP's note, impression and recommendations  Images available for review in PACS.  Show a large pseudocyst compressing the stomach, approximately 11x8 cm in size, appears to have a mature rind, suitable for cystgastrostomy.   Although Dr. Rush Landmark would be able to do procedure tomorrow, we are told that we will not have anesthesia support, and so the procedure will most likely be on Thursday afternoon. Would still have patient NPO after 6 am in the event there is an opening or cancellation.   Kimarion Chery E. Candis Schatz, MD [facility]

## 2021-08-25 NOTE — Plan of Care (Signed)
Pt c/o N/V this morning. PRN administered per MAR.  Problem: Education: Goal: Knowledge of General Education information will improve Description: Including pain rating scale, medication(s)/side effects and non-pharmacologic comfort measures Outcome: Progressing   Problem: Health Behavior/Discharge Planning: Goal: Ability to manage health-related needs will improve Outcome: Progressing   Problem: Clinical Measurements: Goal: Ability to maintain clinical measurements within normal limits will improve Outcome: Progressing Goal: Will remain free from infection Outcome: Progressing Goal: Diagnostic test results will improve Outcome: Progressing Goal: Respiratory complications will improve Outcome: Progressing Goal: Cardiovascular complication will be avoided Outcome: Progressing   Problem: Coping: Goal: Level of anxiety will decrease Outcome: Progressing   Problem: Elimination: Goal: Will not experience complications related to bowel motility Outcome: Progressing Goal: Will not experience complications related to urinary retention Outcome: Progressing   Problem: Pain Managment: Goal: General experience of comfort will improve Outcome: Progressing   Problem: Safety: Goal: Ability to remain free from injury will improve Outcome: Progressing   Problem: Skin Integrity: Goal: Risk for impaired skin integrity will decrease Outcome: Progressing   Problem: Activity: Goal: Risk for activity intolerance will decrease Outcome: Not Progressing   Problem: Nutrition: Goal: Adequate nutrition will be maintained Outcome: Not Progressing

## 2021-08-25 NOTE — Progress Notes (Signed)
   08/25/21 1500  Mobility  Activity Ambulated independently in hallway  Range of Motion/Exercises Active  Assistive Device None  Distance Ambulated (ft) 200 ft  Activity Response Tolerated well  Transport method Ambulatory  $Mobility charge 1 Mobility   Pt complained of pain prior to ambulation but was eager to get up & ambulate after taking pain meds. Pt was aware that ambulating was beneficial for her. Pt left in recliner with necessities in reach.   [facility]

## 2021-08-25 NOTE — Progress Notes (Signed)
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  Last abdominal imaging was MRCP on 08/03/2021 which showed enlargement of the pseudocyst when compared to June imaging. She initially presented to [place] and underwent CT abdomen/pelvis which showed enlargement of pseudocyst with mass effect.  Films were reviewed by GI after patient was transferred to [facility].  Interval History:  Patient resting in bed when seen this afternoon.  Still having ongoing abdominal pain.  She also endorses a poor appetite and did not touch much of her liquid tray.  Assessment and Plan:  Abdominal pain Pancreatic pseudocyst - GI following, appreciate assistance - Sovah images reviewed by GI; tentative plan is for EUS with gastrostomy scheduled for 08/26/2021 - N.p.o. at midnight - Continue fluids, nausea, pain control   Pancreatic CA T1N0, further treatment pending as an outpatient   Anxiety and depression Continue Lexapro Wellbutrin being held for possible induction of nausea although would have low threshold for restarting once her nausea is resolved.   H/oh PE September 2022 - SCD for now  Old records reviewed in assessment of this patient  Antimicrobials:   DVT prophylaxis:  SCDs Start: 08/23/21 2014   Code Status:   Code Status: Full Code  Mobility Assessment (last 72 hours)     Mobility Assessment     Row Name 08/25/21 0845 08/24/21 2010 08/24/21 0939 08/23/21 2248     Does patient have an order for bedrest or is patient medically unstable No - Continue assessment No - Continue assessment No - Continue assessment No - Continue assessment    What is the highest level of mobility based on the progressive mobility  assessment? Level 5 (Walks with assist in room/hall) - Balance while stepping forward/back and can walk in room with assist - Complete Level 5 (Walks with assist in room/hall) - Balance while stepping forward/back and can walk in room with assist - Complete Level 4 (Walks with assist in room) - Balance while marching in place and cannot step forward and back - Complete Level 3 (Stands with assist) - Balance while standing  and cannot march in place    Is the above level different from baseline mobility prior to current illness? -- Yes - Recommend PT order Yes - Recommend PT order Yes - Recommend PT order             Disposition Plan:  Home 2-3 days Status is: Inpt  Objective: Blood pressure 129/65, pulse 72, temperature 98.6 F (37 C), temperature source Oral, resp. rate 18, height '5\' 2"'$  (1.575 m), weight 56 kg, SpO2 94 %.  Examination:  Physical Exam Constitutional:      Appearance: Normal appearance.  HENT:     Head: Normocephalic and atraumatic.     Mouth/Throat:     Mouth: Mucous membranes are dry.  Eyes:     Extraocular Movements: Extraocular movements intact.  Cardiovascular:     Rate and Rhythm: Normal rate and regular rhythm.  Pulmonary:     Effort: Pulmonary effort is normal.     Breath sounds: Normal breath sounds.  Abdominal:     General: Bowel sounds are normal. There is no distension.     Palpations: Abdomen is soft.     Tenderness: There  is abdominal tenderness.  Musculoskeletal:        General: Normal range of motion.     Cervical back: Normal range of motion and neck supple.  Skin:    General: Skin is warm and dry.  Neurological:     General: No focal deficit present.     Mental Status: She is alert.  Psychiatric:        Mood and Affect: Mood normal.      Consultants:  GI  Procedures:    Data Reviewed: Results for orders placed or performed during the hospital encounter of 08/23/21 (from the past 24 hour(s))  Comprehensive metabolic panel      Status: Abnormal   Collection Time: 08/25/21  4:45 AM  Result Value Ref Range   Sodium 142 135 - 145 mmol/L   Potassium 3.9 3.5 - 5.1 mmol/L   Chloride 107 98 - 111 mmol/L   CO2 25 22 - 32 mmol/L   Glucose, Bld 73 70 - 99 mg/dL   BUN 17 8 - 23 mg/dL   Creatinine, Ser 0.56 0.44 - 1.00 mg/dL   Calcium 8.5 (L) 8.9 - 10.3 mg/dL   Total Protein 5.9 (L) 6.5 - 8.1 g/dL   Albumin 2.6 (L) 3.5 - 5.0 g/dL   AST 15 15 - 41 U/L   ALT 19 0 - 44 U/L   Alkaline Phosphatase 63 38 - 126 U/L   Total Bilirubin 0.6 0.3 - 1.2 mg/dL   GFR, Estimated >60 >60 mL/min   Anion gap 10 5 - 15  CBC     Status: Abnormal   Collection Time: 08/25/21  4:45 AM  Result Value Ref Range   WBC 4.5 4.0 - 10.5 K/uL   RBC 3.17 (L) 3.87 - 5.11 MIL/uL   Hemoglobin 10.0 (L) 12.0 - 15.0 g/dL   HCT 30.9 (L) 36.0 - 46.0 %   MCV 97.5 80.0 - 100.0 fL   MCH 31.5 26.0 - 34.0 pg   MCHC 32.4 30.0 - 36.0 g/dL   RDW 13.7 11.5 - 15.5 %   Platelets 281 150 - 400 K/uL   nRBC 0.0 0.0 - 0.2 %    I have Reviewed nursing notes, Vitals, and Lab results since pt's last encounter. Pertinent lab results : see above I have ordered test including BMP, CBC, Mg I have reviewed the last note from staff over past 24 hours I have discussed pt's care plan and test results with nursing staff, case manager   LOS: 2 days   Dwyane Dee, MD [facility] 08/25/2021, 4:48 PM

## 2021-08-25 NOTE — Hospital Course (Addendum)
Ms. 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  Last abdominal imaging was MRCP on 08/03/2021 which showed enlargement of the pseudocyst when compared to June imaging. She initially presented to [place] and underwent CT abdomen/pelvis which showed enlargement of pseudocyst with mass effect.  Films were reviewed by GI after patient was transferred to [facility]. She underwent EUS with cystgastrostomy creation and AXIOS stent placement with GI on 08/27/21.

## 2021-08-26 ENCOUNTER — Other Ambulatory Visit: Payer: Self-pay

## 2021-08-26 DIAGNOSIS — K863 Pseudocyst of pancreas: Secondary | ICD-10-CM | POA: Diagnosis not present

## 2021-08-26 MED ORDER — BUSPIRONE HCL 5 MG PO TABS
20.0000 mg | ORAL_TABLET | Freq: Two times a day (BID) | ORAL | Status: DC
  Administered 2021-08-26 – 2021-08-31 (×11): 20 mg via ORAL
  Filled 2021-08-26 (×12): qty 4

## 2021-08-26 MED ORDER — SALINE SPRAY 0.65 % NA SOLN
1.0000 | NASAL | Status: DC | PRN
  Filled 2021-08-26: qty 44

## 2021-08-26 MED ORDER — QUETIAPINE FUMARATE 25 MG PO TABS
25.0000 mg | ORAL_TABLET | Freq: Three times a day (TID) | ORAL | Status: DC
  Administered 2021-08-26 – 2021-08-31 (×15): 25 mg via ORAL
  Filled 2021-08-26 (×16): qty 1

## 2021-08-26 MED ORDER — BUPROPION HCL ER (XL) 300 MG PO TB24
300.0000 mg | ORAL_TABLET | Freq: Every morning | ORAL | Status: DC
  Administered 2021-08-26 – 2021-08-31 (×6): 300 mg via ORAL
  Filled 2021-08-26 (×6): qty 1

## 2021-08-26 MED ORDER — METOPROLOL SUCCINATE ER 25 MG PO TB24
25.0000 mg | ORAL_TABLET | Freq: Every day | ORAL | Status: DC
  Administered 2021-08-26 – 2021-08-31 (×6): 25 mg via ORAL
  Filled 2021-08-26 (×6): qty 1

## 2021-08-26 NOTE — Care Management Important Message (Signed)
Important Message  Patient Details IM Letter placed in Patients room. Name: Sheryl Bender MRN: [id number] Date of Birth: 09/13/1945   Medicare Important Message Given:  Yes     Kerin Salen 08/26/2021, 10:24 AM

## 2021-08-26 NOTE — H&P (View-Only) (Signed)
Gastroenterology Progress Note  CC:   Increasing pancreatic pseudocyst, epigastric pain with nausea and vomiting  Subjective: She continues to have upper abdominal pain with some nausea without vomiting.  She is taking small sips of clear liquids.  No BM but is passing gas per the rectum.  No chest pain or shortness of breath.  She ambulated in the hall earlier today and sat up in the chair for a while.    Objective:  Vital signs in last 24 hours: Temp:  [98.1 F (36.7 C)-98.6 F (37 C)] 98.1 F (36.7 C) (08/02 0558) Pulse Rate:  [72-79] 78 (08/02 0558) Resp:  [16-18] 16 (08/02 0558) BP: (116-129)/(65-88) 121/68 (08/02 0558) SpO2:  [91 %-94 %] 91 % (08/02 0558) Weight:  [61.4 kg] 61.4 kg (08/02 0558) Last BM Date :  (PTA)  General: Fatigued appearing 76 year old female in no acute distress. Heart: Regular rate and rhythm, no murmurs. Pulm: Breath sounds clear throughout. Abdomen: Soft, generalized abdominal tenderness without rebound or guarding.  Positive bowel sounds to all 4 quadrants. Extremities:  Without edema. Neurologic:  Alert and  oriented x 4. Grossly normal neurologically. Psych:  Alert and cooperative. Normal mood and affect.  Intake/Output from previous day: 08/01 0701 - 08/02 0700 In: 674 [P.O.:674] Out: 1200 [Urine:1100; Emesis/NG output:100] Intake/Output this shift: Total I/O In: 0  Out: 300 [Urine:300]  Lab Results: Recent Labs    08/23/21 2052 08/24/21 0522 08/25/21 0445  WBC 8.6 6.6 4.5  HGB 10.2* 10.3* 10.0*  HCT 31.8* 32.5* 30.9*  PLT 323 324 281   BMET Recent Labs    08/23/21 2052 08/24/21 0522 08/25/21 0445  NA 142 138 142  K 3.7 3.6 3.9  CL 108 105 107  CO2 '25 24 25  '$ GLUCOSE 91 83 73  BUN '15 19 17  '$ CREATININE 0.74 0.66 0.56  CALCIUM 8.2* 8.6* 8.5*   LFT Recent Labs    08/25/21 0445  PROT 5.9*  ALBUMIN 2.6*  AST 15  ALT 19  ALKPHOS 63  BILITOT 0.6   PT/INR Recent Labs    08/24/21 0522  LABPROT 14.7   INR 1.2   Hepatitis Panel No results for input(s): "HEPBSAG", "HCVAB", "HEPAIGM", "HEPBIGM" in the last 72 hours.  No results found.  Assessment / Plan:  63) 76 year old female with pancreatic adenocarcinoma S/P EUS 07/09/2021, FNA confirmed adenocarcinoma. 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  Followed by Dr. Zenia Resides for possible hepatobiliary surgery and Dr. Delton Coombes with oncology. She was admitted to [facility] 08/23/2021 with worsening N/V and abdominal pain. Repeat CT showed enlargement of her pseudocyst with mass effect. Transferred to [facility] 08/24/2021 for possible cystogastrostomy.  -NPO after midnight -EUS/cystogastrostomy scheduled Thurs 8/3 at 1pm with Dr. Rush Landmark -Zofran 4 mg IV every 6 hours as needed -Pain management per the hospitalist -Continue IV fluid -Continue clear liquid diet   -Await further recommendations per Dr. Candis Schatz   2) GERD -Continue Pantoprazole 40 mg IV every 24 hours   3) PE 09/2020, not on anticoagulation  Principal Problem:   Pancreatic pseudocyst Active Problems:   GERD (gastroesophageal reflux disease)   Depression   Epigastric pain   Nausea & vomiting   Malnutrition of moderate degree     LOS: 3 days   Patrecia Pour Kennedy-Smith  08/26/2021, 1:13 PM  --------------------------------------------------------------------------------  I have taken a history, reviewed the chart and examined the patient. I performed a substantive portion of this encounter, including  complete performance of at least one of the key components, in conjunction with the APP. I agree with the APP's note, impression and recommendations  No further recommendations.  Patient clinical status unchanged with mild abdominal discomfort, tolerating only small amounts of a clear liquid diet. Patient scheduled for EUS with cystgastrostomy tomorrow afternoon with Dr. Garwin Brothers E. Candis Schatz, MD [facility]

## 2021-08-26 NOTE — Progress Notes (Signed)
   08/26/21 1406  Mobility  Activity Ambulated independently in hallway  Range of Motion/Exercises Active  Level of Assistance Contact guard assist, steadying assist  Assistive Device None  Distance Ambulated (ft) 250 ft  Activity Response Tolerated well  Transport method Ambulatory  $Mobility charge 1 Mobility   Pt was eager to ambulate to ease her anxiety. Pt had a few scissor steps that were self corrected. Pt mentioned having less anxiety post ambulation. Pt left in bed with necessities in reach.    [facility]

## 2021-08-26 NOTE — Progress Notes (Signed)
The proposed treatment discussed in conference is for discussion purpose only and is not a binding recommendation.  The patients have not been physically examined, or presented with their treatment options.  Therefore, final treatment plans cannot be decided.  

## 2021-08-26 NOTE — Progress Notes (Signed)
Progress Note    Saya Mccoll   OEU:[id number]  DOB: 06/21/1945  DOA: 08/23/2021     3 PCP: Sherrilee Gilles, DO  Initial CC: Abdominal pain  Hospital Course: Ms. 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  Last abdominal imaging was MRCP on 08/03/2021 which showed enlargement of the pseudocyst when compared to June imaging. She initially presented to [place] and underwent CT abdomen/pelvis which showed enlargement of pseudocyst with mass effect.  Films were reviewed by GI after patient was transferred to [facility].  Interval History:  Patient resting in bed when seen this afternoon.  Still having ongoing abdominal pain.  She also endorses a poor appetite and did not touch much of her liquid tray.  Assessment and Plan:  Abdominal pain Pancreatic pseudocyst - GI following, appreciate assistance - Sovah images reviewed by GI; tentative plan is for EUS with gastrostomy scheduled for 08/27/2021 - N.p.o. at midnight - Continue fluids, nausea, pain control   Pancreatic CA T1N0, further treatment pending as an outpatient   Anxiety and depression - home meds reviewed with patient as she is on a large amount of psychotropics - resumed home regimen in efforts to prevent withdrawal    H/oh PE September 2022 - SCD for now - no longer on anticoagulation   Old records reviewed in assessment of this patient  Antimicrobials:   DVT prophylaxis:  SCDs Start: 08/23/21 2014   Code Status:   Code Status: Full Code  Mobility Assessment (last 72 hours)     Mobility Assessment     Row Name 08/25/21 0845 08/24/21 2010 08/24/21 0939 08/23/21 2248     Does patient have an order for bedrest or is patient medically unstable No - Continue assessment No - Continue assessment No - Continue assessment No - Continue assessment    What is the highest level of mobility based on the  progressive mobility assessment? Level 5 (Walks with assist in room/hall) - Balance while stepping forward/back and can walk in room with assist - Complete Level 5 (Walks with assist in room/hall) - Balance while stepping forward/back and can walk in room with assist - Complete Level 4 (Walks with assist in room) - Balance while marching in place and cannot step forward and back - Complete Level 3 (Stands with assist) - Balance while standing  and cannot march in place    Is the above level different from baseline mobility prior to current illness? -- Yes - Recommend PT order Yes - Recommend PT order Yes - Recommend PT order             Disposition Plan:  Home 2-3 days Status is: Inpt  Objective: Blood pressure 120/67, pulse 71, temperature 98.6 F (37 C), temperature source Oral, resp. rate 16, height '5\' 2"'$  (1.575 m), weight 61.4 kg, SpO2 93 %.  Examination:  Physical Exam Constitutional:      Appearance: Normal appearance.  HENT:     Head: Normocephalic and atraumatic.     Mouth/Throat:     Mouth: Mucous membranes are moist.  Eyes:     Extraocular Movements: Extraocular movements intact.  Cardiovascular:     Rate and Rhythm: Normal rate and regular rhythm.  Pulmonary:     Effort: Pulmonary effort is normal.     Breath sounds: Normal breath sounds.  Abdominal:     General: Bowel sounds are normal. There is no distension.     Palpations:  Abdomen is soft.     Tenderness: There is abdominal tenderness.  Musculoskeletal:        General: Normal range of motion.     Cervical back: Normal range of motion and neck supple.  Skin:    General: Skin is warm and dry.  Neurological:     General: No focal deficit present.     Mental Status: She is alert.  Psychiatric:        Mood and Affect: Mood normal.      Consultants:  GI  Procedures:    Data Reviewed: No results found for this or any previous visit (from the past 24 hour(s)).   I have Reviewed nursing notes, Vitals, and  Lab results since pt's last encounter. Pertinent lab results : see above I have ordered test including BMP, CBC, Mg I have reviewed the last note from staff over past 24 hours I have discussed pt's care plan and test results with nursing staff, case manager   LOS: 3 days   Dwyane Dee, MD [facility] 08/26/2021, 3:43 PM

## 2021-08-26 NOTE — TOC Progression Note (Signed)
Transition of Care [facility]) - Progression Note    Patient Details  Name: Sheryl Bender MRN: [id number] Date of Birth: 1945/10/11  Transition of Care [facility]) CM/SW Contact  Purcell Mouton, RN Phone Number: 08/26/2021, 2:45 PM  Clinical Narrative:    Pt from home alone. TOC will continue to follow for discharge needs.   Expected Discharge Plan: [place] Barriers to Discharge: No Barriers Identified  Expected Discharge Plan and Services Expected Discharge Plan: [place] arrangements for the past 2 months: Single Family Home                                       Social Determinants of Health (SDOH) Interventions    Readmission Risk Interventions    07/17/2021    1:22 PM  Readmission Risk Prevention Plan  Transportation Screening Complete  Home Care Screening Complete  Medication Review (RN CM) Complete

## 2021-08-26 NOTE — Progress Notes (Addendum)
[facility] Progress Note  CC:   Increasing pancreatic pseudocyst, epigastric pain with nausea and vomiting  Subjective: She continues to have upper abdominal pain with some nausea without vomiting.  She is taking small sips of clear liquids.  No BM but is passing gas per the rectum.  No chest pain or shortness of breath.  She ambulated in the hall earlier today and sat up in the chair for a while.    Objective:  Vital signs in last 24 hours: Temp:  [98.1 F (36.7 C)-98.6 F (37 C)] 98.1 F (36.7 C) (08/02 0558) Pulse Rate:  [72-79] 78 (08/02 0558) Resp:  [16-18] 16 (08/02 0558) BP: (116-129)/(65-88) 121/68 (08/02 0558) SpO2:  [91 %-94 %] 91 % (08/02 0558) Weight:  [61.4 kg] 61.4 kg (08/02 0558) Last BM Date :  (PTA)  General: Fatigued appearing 76 year old female in no acute distress. Heart: Regular rate and rhythm, no murmurs. Pulm: Breath sounds clear throughout. Abdomen: Soft, generalized abdominal tenderness without rebound or guarding.  Positive bowel sounds to all 4 quadrants. Extremities:  Without edema. Neurologic:  Alert and  oriented x 4. Grossly normal neurologically. Psych:  Alert and cooperative. Normal mood and affect.  Intake/Output from previous day: 08/01 0701 - 08/02 0700 In: 674 [P.O.:674] Out: 1200 [Urine:1100; Emesis/NG output:100] Intake/Output this shift: Total I/O In: 0  Out: 300 [Urine:300]  Lab Results: Recent Labs    08/23/21 2052 08/24/21 0522 08/25/21 0445  WBC 8.6 6.6 4.5  HGB 10.2* 10.3* 10.0*  HCT 31.8* 32.5* 30.9*  PLT 323 324 281   BMET Recent Labs    08/23/21 2052 08/24/21 0522 08/25/21 0445  NA 142 138 142  K 3.7 3.6 3.9  CL 108 105 107  CO2 '25 24 25  '$ GLUCOSE 91 83 73  BUN '15 19 17  '$ CREATININE 0.74 0.66 0.56  CALCIUM 8.2* 8.6* 8.5*   LFT Recent Labs    08/25/21 0445  PROT 5.9*  ALBUMIN 2.6*  AST 15  ALT 19  ALKPHOS 63  BILITOT 0.6   PT/INR Recent Labs    08/24/21 0522  LABPROT 14.7   INR 1.2   Hepatitis Panel No results for input(s): "HEPBSAG", "HCVAB", "HEPAIGM", "HEPBIGM" in the last 72 hours.  No results found.  Assessment / Plan:  69) 76 year old female with pancreatic adenocarcinoma S/P EUS 07/09/2021, FNA confirmed adenocarcinoma. 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  Followed by Dr. Zenia Resides for possible hepatobiliary surgery and Dr. Delton Coombes with oncology. She was admitted to [facility] 08/23/2021 with worsening N/V and abdominal pain. Repeat CT showed enlargement of her pseudocyst with mass effect. Transferred to [facility] 08/24/2021 for possible cystogastrostomy.  -NPO after midnight -EUS/cystogastrostomy scheduled Thurs 8/3 at 1pm with Dr. Rush Landmark -Zofran 4 mg IV every 6 hours as needed -Pain management per the hospitalist -Continue IV fluid -Continue clear liquid diet   -Await further recommendations per Dr. Candis Schatz   2) GERD -Continue Pantoprazole 40 mg IV every 24 hours   3) PE 09/2020, not on anticoagulation  Principal Problem:   Pancreatic pseudocyst Active Problems:   GERD (gastroesophageal reflux disease)   Depression   Epigastric pain   Nausea & vomiting   Malnutrition of moderate degree     LOS: 3 days   Patrecia Pour Kennedy-Smith  08/26/2021, 1:13 PM  --------------------------------------------------------------------------------  I have taken a history, reviewed the chart and examined the patient. I performed a substantive portion of this encounter, including  complete performance of at least one of the key components, in conjunction with the APP. I agree with the APP's note, impression and recommendations  No further recommendations.  Patient clinical status unchanged with mild abdominal discomfort, tolerating only small amounts of a clear liquid diet. Patient scheduled for EUS with cystgastrostomy tomorrow afternoon with Dr. Garwin Brothers E. Candis Schatz, MD [facility]

## 2021-08-27 ENCOUNTER — Inpatient Hospital Stay (HOSPITAL_COMMUNITY): Payer: Medicare Other | Admitting: Anesthesiology

## 2021-08-27 ENCOUNTER — Telehealth: Payer: Self-pay

## 2021-08-27 ENCOUNTER — Encounter (HOSPITAL_COMMUNITY): Admission: AD | Disposition: A | Discharge status: Home / Self Care | Payer: Self-pay | Attending: Internal Medicine

## 2021-08-27 ENCOUNTER — Encounter (HOSPITAL_COMMUNITY): Payer: Self-pay | Admitting: Internal Medicine

## 2021-08-27 DIAGNOSIS — K3189 Other diseases of stomach and duodenum: Secondary | ICD-10-CM

## 2021-08-27 DIAGNOSIS — K862 Cyst of pancreas: Secondary | ICD-10-CM

## 2021-08-27 DIAGNOSIS — R6881 Early satiety: Secondary | ICD-10-CM

## 2021-08-27 DIAGNOSIS — C259 Malignant neoplasm of pancreas, unspecified: Secondary | ICD-10-CM

## 2021-08-27 DIAGNOSIS — I10 Essential (primary) hypertension: Secondary | ICD-10-CM

## 2021-08-27 DIAGNOSIS — R630 Anorexia: Secondary | ICD-10-CM

## 2021-08-27 DIAGNOSIS — K2289 Other specified disease of esophagus: Secondary | ICD-10-CM

## 2021-08-27 DIAGNOSIS — K863 Pseudocyst of pancreas: Secondary | ICD-10-CM | POA: Diagnosis not present

## 2021-08-27 DIAGNOSIS — K8689 Other specified diseases of pancreas: Secondary | ICD-10-CM

## 2021-08-27 HISTORY — PX: PANCREATIC STENT PLACEMENT: SHX5539

## 2021-08-27 HISTORY — PX: BALLOON DILATION: SHX5330

## 2021-08-27 HISTORY — PX: CYST GASTROSTOMY: SHX6862

## 2021-08-27 HISTORY — PX: EUS: SHX5427

## 2021-08-27 HISTORY — PX: ESOPHAGOGASTRODUODENOSCOPY (EGD) WITH PROPOFOL: SHX5813

## 2021-08-27 LAB — CBC WITH DIFFERENTIAL/PLATELET
Abs Immature Granulocytes: 0.06 10*3/uL (ref 0.00–0.07)
Basophils Absolute: 0 10*3/uL (ref 0.0–0.1)
Basophils Relative: 1 %
Eosinophils Absolute: 0.1 10*3/uL (ref 0.0–0.5)
Eosinophils Relative: 1 %
HCT: 32.5 % — ABNORMAL LOW (ref 36.0–46.0)
Hemoglobin: 10.5 g/dL — ABNORMAL LOW (ref 12.0–15.0)
Immature Granulocytes: 2 %
Lymphocytes Relative: 25 %
Lymphs Abs: 0.9 10*3/uL (ref 0.7–4.0)
MCH: 31 pg (ref 26.0–34.0)
MCHC: 32.3 g/dL (ref 30.0–36.0)
MCV: 95.9 fL (ref 80.0–100.0)
Monocytes Absolute: 0.4 10*3/uL (ref 0.1–1.0)
Monocytes Relative: 11 %
Neutro Abs: 2.1 10*3/uL (ref 1.7–7.7)
Neutrophils Relative %: 60 %
Platelets: 323 10*3/uL (ref 150–400)
RBC: 3.39 MIL/uL — ABNORMAL LOW (ref 3.87–5.11)
RDW: 13.4 % (ref 11.5–15.5)
WBC: 3.5 10*3/uL — ABNORMAL LOW (ref 4.0–10.5)
nRBC: 0 % (ref 0.0–0.2)

## 2021-08-27 LAB — BASIC METABOLIC PANEL
Anion gap: 6 (ref 5–15)
BUN: 6 mg/dL — ABNORMAL LOW (ref 8–23)
CO2: 28 mmol/L (ref 22–32)
Calcium: 8.6 mg/dL — ABNORMAL LOW (ref 8.9–10.3)
Chloride: 109 mmol/L (ref 98–111)
Creatinine, Ser: 0.67 mg/dL (ref 0.44–1.00)
GFR, Estimated: >60 mL/min (ref >60)
Glucose, Bld: 80 mg/dL (ref 70–99)
Potassium: 3.9 mmol/L (ref 3.5–5.1)
Sodium: 143 mmol/L (ref 135–145)

## 2021-08-27 LAB — MAGNESIUM: Magnesium: 1.9 mg/dL (ref 1.7–2.4)

## 2021-08-27 SURGERY — ESOPHAGOGASTRODUODENOSCOPY (EGD) WITH PROPOFOL
Anesthesia: General

## 2021-08-27 MED ORDER — CIPROFLOXACIN IN D5W 400 MG/200ML IV SOLN
INTRAVENOUS | Status: AC
  Filled 2021-08-27: qty 200

## 2021-08-27 MED ORDER — PROPOFOL 10 MG/ML IV BOLUS
INTRAVENOUS | Status: AC
  Filled 2021-08-27: qty 20

## 2021-08-27 MED ORDER — DEXAMETHASONE SODIUM PHOSPHATE 10 MG/ML IJ SOLN
INTRAMUSCULAR | Status: DC | PRN
  Administered 2021-08-27: 4 mg via INTRAVENOUS

## 2021-08-27 MED ORDER — PROPOFOL 10 MG/ML IV BOLUS
INTRAVENOUS | Status: DC | PRN
  Administered 2021-08-27: 100 mg via INTRAVENOUS

## 2021-08-27 MED ORDER — ALUM & MAG HYDROXIDE-SIMETH 200-200-20 MG/5ML PO SUSP
30.0000 mL | ORAL | Status: DC | PRN
  Administered 2021-08-27 – 2021-08-28 (×2): 30 mL via ORAL
  Filled 2021-08-27: qty 30

## 2021-08-27 MED ORDER — SUGAMMADEX SODIUM 500 MG/5ML IV SOLN
INTRAVENOUS | Status: DC | PRN
  Administered 2021-08-27: 200 mg via INTRAVENOUS

## 2021-08-27 MED ORDER — FENTANYL CITRATE (PF) 100 MCG/2ML IJ SOLN
INTRAMUSCULAR | Status: AC
  Filled 2021-08-27: qty 2

## 2021-08-27 MED ORDER — ONDANSETRON HCL 4 MG/2ML IJ SOLN
INTRAMUSCULAR | Status: DC | PRN
  Administered 2021-08-27: 4 mg via INTRAVENOUS

## 2021-08-27 MED ORDER — LIDOCAINE HCL (CARDIAC) PF 100 MG/5ML IV SOSY
PREFILLED_SYRINGE | INTRAVENOUS | Status: DC | PRN
  Administered 2021-08-27: 60 mg via INTRAVENOUS

## 2021-08-27 MED ORDER — EPHEDRINE SULFATE (PRESSORS) 50 MG/ML IJ SOLN
INTRAMUSCULAR | Status: DC | PRN
  Administered 2021-08-27: 10 mg via INTRAVENOUS

## 2021-08-27 MED ORDER — FENTANYL CITRATE (PF) 100 MCG/2ML IJ SOLN
INTRAMUSCULAR | Status: DC | PRN
  Administered 2021-08-27: 100 ug via INTRAVENOUS

## 2021-08-27 MED ORDER — ROCURONIUM BROMIDE 100 MG/10ML IV SOLN
INTRAVENOUS | Status: DC | PRN
  Administered 2021-08-27: 50 mg via INTRAVENOUS

## 2021-08-27 MED ORDER — CIPROFLOXACIN HCL 500 MG PO TABS
500.0000 mg | ORAL_TABLET | Freq: Two times a day (BID) | ORAL | Status: DC
Start: 2021-08-27 — End: 2021-08-29
  Administered 2021-08-27 – 2021-08-29 (×4): 500 mg via ORAL
  Filled 2021-08-27 (×4): qty 1

## 2021-08-27 MED ORDER — SODIUM CHLORIDE 0.9 % IV SOLN: INTRAVENOUS | Status: DC

## 2021-08-27 MED ORDER — CIPROFLOXACIN IN D5W 400 MG/200ML IV SOLN
INTRAVENOUS | Status: DC | PRN
  Administered 2021-08-27: 400 mg via INTRAVENOUS

## 2021-08-27 MED ORDER — LACTATED RINGERS IV SOLN: INTRAVENOUS | Status: DC

## 2021-08-27 NOTE — Telephone Encounter (Signed)
-----   Message from Irving Copas., MD sent at 08/27/2021  4:20 PM EDT ----- Regarding: AXIOS stent follow-up Sheryl Bender, Patient needs a CT abdomen in 3 weeks. Also go ahead and look for an EGD stent pull in the hospital 45-minute case 1 to 2 weeks after the CT scan. Thanks. GM

## 2021-08-27 NOTE — Progress Notes (Signed)
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  Last abdominal imaging was MRCP on 08/03/2021 which showed enlargement of the pseudocyst when compared to June imaging. She initially presented to [place] and underwent CT abdomen/pelvis which showed enlargement of pseudocyst with mass effect.  Films were reviewed by GI after patient was transferred to [facility].  Interval History:  No events overnight.  Resting in bed when seen this morning.  She was a little nervous about the procedure later today.  Tried answering her questions to the best of my ability.  Assessment and Plan:  Abdominal pain Pancreatic pseudocyst - GI following, appreciate assistance - [place] images reviewed by GI; tentative plan is for EUS with gastrostomy scheduled for 08/27/2021 - Continue fluids, nausea, pain control   Pancreatic CA T1N0, further treatment pending as an outpatient   Anxiety and depression - home meds reviewed with patient as she is on a large amount of psychotropics - resumed home regimen in efforts to prevent withdrawal    H/oh PE September 2022 - SCD for now - no longer on anticoagulation   Old records reviewed in assessment of this patient  Antimicrobials:   DVT prophylaxis:  SCDs Start: 08/23/21 2014   Code Status:   Code Status: Full Code  Mobility Assessment (last 72 hours)     Mobility Assessment     Row Name 08/26/21 2000 08/25/21 0845 08/24/21 2010       Does patient have an order for bedrest or is patient medically unstable No - Continue assessment No - Continue assessment No - Continue assessment     What is the highest level of mobility based on the progressive mobility assessment? Level 5 (Walks with  assist in room/hall) - Balance while stepping forward/back and can walk in room with assist - Complete Level 5 (Walks with assist in room/hall) - Balance while stepping forward/back and can walk in room with assist - Complete Level 5 (Walks with assist in room/hall) - Balance while stepping forward/back and can walk in room with assist - Complete     Is the above level different from baseline mobility prior to current illness? -- -- Yes - Recommend PT order              Disposition Plan:  Home 2-3 days Status is: Inpt  Objective: Blood pressure 138/69, pulse 74, temperature 98.6 F (37 C), temperature source Oral, resp. rate 18, height '5\' 2"'$  (1.575 m), weight 59.8 kg, SpO2 92 %.  Examination:  Physical Exam Constitutional:      Appearance: Normal appearance.  HENT:     Head: Normocephalic and atraumatic.     Mouth/Throat:     Mouth: Mucous membranes are moist.  Eyes:     Extraocular Movements: Extraocular movements intact.  Cardiovascular:     Rate and Rhythm: Normal rate and regular rhythm.  Pulmonary:     Effort: Pulmonary effort is normal.     Breath sounds: Normal breath sounds.  Abdominal:     General: Bowel sounds are normal. There is no distension.     Palpations: Abdomen is soft.     Tenderness: There is abdominal tenderness.  Musculoskeletal:        General: Normal range of motion.  Cervical back: Normal range of motion and neck supple.  Skin:    General: Skin is warm and dry.  Neurological:     General: No focal deficit present.     Mental Status: She is alert.  Psychiatric:        Mood and Affect: Mood normal.      Consultants:  GI  Procedures:    Data Reviewed: Results for orders placed or performed during the hospital encounter of 08/23/21 (from the past 24 hour(s))  Basic metabolic panel     Status: Abnormal   Collection Time: 08/27/21  5:14 AM  Result Value Ref Range   Sodium 143 135 - 145 mmol/L   Potassium 3.9 3.5 - 5.1 mmol/L   Chloride  109 98 - 111 mmol/L   CO2 28 22 - 32 mmol/L   Glucose, Bld 80 70 - 99 mg/dL   BUN 6 (L) 8 - 23 mg/dL   Creatinine, Ser 0.67 0.44 - 1.00 mg/dL   Calcium 8.6 (L) 8.9 - 10.3 mg/dL   GFR, Estimated >60 >60 mL/min   Anion gap 6 5 - 15  CBC with Differential/Platelet     Status: Abnormal   Collection Time: 08/27/21  5:14 AM  Result Value Ref Range   WBC 3.5 (L) 4.0 - 10.5 K/uL   RBC 3.39 (L) 3.87 - 5.11 MIL/uL   Hemoglobin 10.5 (L) 12.0 - 15.0 g/dL   HCT 32.5 (L) 36.0 - 46.0 %   MCV 95.9 80.0 - 100.0 fL   MCH 31.0 26.0 - 34.0 pg   MCHC 32.3 30.0 - 36.0 g/dL   RDW 13.4 11.5 - 15.5 %   Platelets 323 150 - 400 K/uL   nRBC 0.0 0.0 - 0.2 %   Neutrophils Relative % 60 %   Neutro Abs 2.1 1.7 - 7.7 K/uL   Lymphocytes Relative 25 %   Lymphs Abs 0.9 0.7 - 4.0 K/uL   Monocytes Relative 11 %   Monocytes Absolute 0.4 0.1 - 1.0 K/uL   Eosinophils Relative 1 %   Eosinophils Absolute 0.1 0.0 - 0.5 K/uL   Basophils Relative 1 %   Basophils Absolute 0.0 0.0 - 0.1 K/uL   Immature Granulocytes 2 %   Abs Immature Granulocytes 0.06 0.00 - 0.07 K/uL  Magnesium     Status: None   Collection Time: 08/27/21  5:14 AM  Result Value Ref Range   Magnesium 1.9 1.7 - 2.4 mg/dL     I have Reviewed nursing notes, Vitals, and Lab results since pt's last encounter. Pertinent lab results : see above I have ordered test including BMP, CBC, Mg I have reviewed the last note from staff over past 24 hours I have discussed pt's care plan and test results with nursing staff, case manager   LOS: 4 days   Sheryl Dee, MD [facility] 08/27/2021, 12:08 PM

## 2021-08-27 NOTE — Interval H&P Note (Signed)
History and Physical Interval Note:  08/27/2021 3:00 PM  Sheryl Bender  has presented today for surgery, with the diagnosis of Pancreactic pseudocyst.  The various methods of treatment have been discussed with the patient and family. After consideration of risks, benefits and other options for treatment, the patient has consented to  Procedure(s) with comments: UPPER ENDOSCOPIC ULTRASOUND (EUS) LINEAR (N/A) - with cystgastrotomy as a surgical intervention.  The patient's history has been reviewed, patient examined, no change in status, stable for surgery.  I have reviewed the patient's chart and labs.  Questions were answered to the patient's satisfaction.    The risks of an EUS, including intestinal perforation, bleeding, infection, aspiration, and medication effects were discussed.  When a cystgastrostomy/cystenterostomy is performed as part of the EUS, there is an additional risk of pancreatitis at the rate of about 1-2%.  It was explained that procedure related pancreatitis is typically mild, although at times it can be severe and even life threatening.  The risks and benefits of endoscopic evaluation were discussed with the patient; these include but are not limited to the risk of perforation, infection, bleeding, missed lesions, lack of diagnosis, severe illness requiring hospitalization, as well as anesthesia and sedation related illnesses.  The patient and/or family is agreeable to proceed.    [employer]

## 2021-08-27 NOTE — Anesthesia Procedure Notes (Signed)
Procedure Name: Intubation Date/Time: 08/27/2021 3:09 PM  Performed by: Jonna Munro, CRNAPre-anesthesia Checklist: Patient identified, Emergency Drugs available, Suction available, Patient being monitored and Timeout performed Patient Re-evaluated:Patient Re-evaluated prior to induction Oxygen Delivery Method: Circle system utilized Preoxygenation: Pre-oxygenation with 100% oxygen Induction Type: IV induction Ventilation: Mask ventilation without difficulty Laryngoscope Size: Mac and 3 Grade View: Grade I Tube type: Oral Tube size: 7.0 mm Number of attempts: 1 Airway Equipment and Method: Stylet Placement Confirmation: ETT inserted through vocal cords under direct vision, positive ETCO2 and breath sounds checked- equal and bilateral Secured at: 22 cm Tube secured with: Tape Dental Injury: Teeth and Oropharynx as per pre-operative assessment

## 2021-08-27 NOTE — Anesthesia Postprocedure Evaluation (Signed)
Anesthesia Post Note  Patient: Kynzley Dowson  Procedure(s) Performed: UPPER ENDOSCOPIC ULTRASOUND (EUS) LINEAR ESOPHAGOGASTRODUODENOSCOPY (EGD) WITH PROPOFOL CYST GASTROSTOMY PANCREATIC STENT PLACEMENT BALLOON DILATION     Patient location during evaluation: PACU Anesthesia Type: General Level of consciousness: awake and alert Pain management: pain level controlled Vital Signs Assessment: post-procedure vital signs reviewed and stable Respiratory status: spontaneous breathing, nonlabored ventilation, respiratory function stable and patient connected to nasal cannula oxygen Cardiovascular status: blood pressure returned to baseline and stable Postop Assessment: no apparent nausea or vomiting Anesthetic complications: no   No notable events documented.  Last Vitals:  Vitals:   08/27/21 1605 08/27/21 1610  BP: (!) 166/72 (!) 159/71  Pulse: 71 68  Resp: 13 14  Temp:    SpO2: 99% 95%    Last Pain:  Vitals:   08/27/21 1602  TempSrc: Temporal  PainSc: 0-No pain                 Santa Lighter

## 2021-08-27 NOTE — Op Note (Signed)
[facility] Patient Name: Nahiara Kretzschmar Procedure Date: 08/27/2021 MRN: [id number] Attending MD: Justice Britain , MD Date of Birth: 11-05-1945 CSN: [id number] Age: 76 Admit Type: Inpatient Procedure:                Upper EUS Indications:              Pancreatic cyst on CT scan, Epigastric abdominal                            pain, Anorexia, Early satiety, Nausea with vomiting Providers:                Justice Britain, MD, Carlyn Reichert, RN, Frazier Richards, Technician Referring MD:             Blanchard Mane. Ellin Mayhew, MD,                            Hospitalists Medicines:                General Anesthesia, Cipro 390 mg IV Complications:            No immediate complications. Estimated Blood Loss:     Estimated blood loss was minimal. Procedure:                Pre-Anesthesia Assessment:                           - Prior to the procedure, a History and Physical                            was performed, and patient medications and                            allergies were reviewed. The patient's tolerance of                            previous anesthesia was also reviewed. The risks                            and benefits of the procedure and the sedation                            options and risks were discussed with the patient.                            All questions were answered, and informed consent                            was obtained. Prior Anticoagulants: The patient has                            taken no previous anticoagulant or antiplatelet  agents. ASA Grade Assessment: III - A patient with                            severe systemic disease. After reviewing the risks                            and benefits, the patient was deemed in                            satisfactory condition to undergo the procedure.                           After obtaining informed consent, the endoscope was                             passed under direct vision. Throughout the                            procedure, the patient's blood pressure, pulse, and                            oxygen saturations were monitored continuously. The                            [id number]-[id number] ([id number]) Olympus endoscope was introduced                            through the mouth, and advanced to the second part                            of duodenum. The [id number] ([id number] Olympus linear                            ultrasound scope was introduced through the mouth,                            and advanced to the stomach for ultrasound                            examination. The upper EUS was accomplished without                            difficulty. The patient tolerated the procedure. Scope In: Scope Out: Findings:      ENDOSCOPIC FINDING: :      No gross lesions were noted in the entire esophagus.      The Z-line was irregular and was found 38 cm from the incisors.      An extrinsic impression/deformity was found in the gastric       antrum/prepyloric region/pylorus.      Patchy mildly erythematous mucosa without bleeding was found in the       entire examined stomach. Previously biopsied and negative for HP so not       rebiopsied.      A moderate extrinsic impression/deformity was found in the duodenal  bulb, in the first portion of the duodenum and in the second portion of       the duodenum.      ENDOSONOGRAPHIC FINDING: :      An anechoic lesion suggestive of a cyst was identified in the pancreatic       head/genu. The lesion measured 90 mm by 62 mm in maximal cross-sectional       diameter. There was a single compartment without septae. The outer wall       of the lesion was thick. There was no associated mass. There was no       internal debris within the fluid-filled cavity. The decision was made to       create a cystogastrostomy using the AXIOS stent system to improve       symptoms of gastric  outlet. Once an appropriate position in the stomach       was identified, the common wall between the stomach and the cyst was       interrogated utilizing color Doppler imaging to identify interposed       vessels. The AXIOS stent and electrocautery device was introduced       through the working channel and advanced to the wall. Current was       applied to the cautery tip and then used to increase the diameter of the       stoma. The AXIOS device was advanced into the cyst, and a 15 x 10 mm       AXIOS stent was placed with the flanges in close approximation to the       walls of the cyst and the stomach through the cystogastrostomy. The       stent was successfully placed. Placement of a long 0.035 inch Soft       Jagwire was attempted. This passed successfully. A TTS dilator was       passed through the scope over the wire. Dilation with an 09-02-08 mm       balloon dilator was performed to 8 mm to allow for stent expansion. A 5       cm 7 Fr double pigtail stent was placed through the AXIOS into the       pseudocyst cavity using a stent introducer set. The stent was       successfully placed. Suction via Endoscope was performed with removal of       250 cc of fluid in total. Part of fluid sent for culture. Impression:               EGD Impression:                           - No gross lesions in esophagus. Z-line irregular,                            38 cm from the incisors.                           - Acquired extrinsic impression deformity in the                            gastric antrum/prepyloric region/pylorus.                           -  Erythematous mucosa in the stomach - previously                            negative for HP.                           - Duodenal extrinsic impression deformity.                           EUS Impression:                           - A cystic lesion was seen in the pancreatic head                            and genu of the pancreas. Tissue has not  been                            obtained. However, the endosonographic appearance                            is consistent with a pancreatic pseudocyst. AXIOS                            Cystgastrostomy created. Dilated AXIOS tract.                            Stented. Suctioned 250 cc of fluid in total. Fluid                            culture specimen has been sent. Moderate Sedation:      Not Applicable - Patient had care per Anesthesia. Recommendation:           - The patient will be observed post-procedure,                            until all discharge criteria are met.                           - Return patient to hospital ward for ongoing care.                           - Clear liquid diet.                           - Observe patient's clinical course.                           - Would hold chemical VTE prophylaxis for 48 hours                            to decrease risk of post-interventional bleeding.                            If anticoagulation is necessary consider heparin  drip without bolus in 6-12 hours and monitor                            closely.                           - Will stop PPI for now. If able would allow                            patient to not need PPI so that acids can enter                            into cyst cavity and help break it down.                           - Follow up cystgastrostomy fluid culture.                           - Ciprofloxacin 500 mg BID x 3-days unless any                            growth from the cystgastrostomy fluid.                           - Observe patient's clinical course.                           - Repeat CT-Abdomen in 3-weeks. If cyst has                            decompressed completely, will plan EGD with stent                            pull 1-2 weeks later.                           - When stent has been pulled, if cyst recollects,                            this would be concerning for  a pancreatic duct leak                            that could require repeat cystgastrostomy as well                            as consideration of Pancreatic duct ERCP stenting                            attempt. Hopefully this will not be the case.                           - The findings and recommendations were discussed  with the patient.                           - The findings and recommendations were discussed                            with the patient's family.                           - The findings and recommendations were discussed                            with the referring physician. Procedure Code(s):        --- Professional ---                           [phone], Esophagogastroduodenoscopy, flexible,                            transoral; with transmural drainage of pseudocyst                            (includes placement of transmural drainage                            catheter[s]/stent[s], when performed, and                            endoscopic ultrasound, when performed)                           [id number], Esophagogastroduodenoscopy, flexible,                            transoral; with dilation of gastric/duodenal                            stricture(s) (eg, balloon, bougie) Diagnosis Code(s):        --- Professional ---                           K22.8, Other specified diseases of esophagus                           K31.89, Other diseases of stomach and duodenum                           K86.2, Cyst of pancreas                           R10.13, Epigastric pain                           R63.0, Anorexia                           R68.81, Early satiety  R11.2, Nausea with vomiting, unspecified CPT copyright 2019 American Medical Association. All rights reserved. The codes documented in this report are preliminary and upon coder review may  be revised to meet current compliance requirements. Justice Britain,  MD 08/27/2021 4:08:19 PM Number of Addenda: 0

## 2021-08-27 NOTE — Transfer of Care (Signed)
Immediate Anesthesia Transfer of Care Note  Patient: Marlin Brys  Procedure(s) Performed: UPPER ENDOSCOPIC ULTRASOUND (EUS) LINEAR ESOPHAGOGASTRODUODENOSCOPY (EGD) WITH PROPOFOL CYST GASTROSTOMY PANCREATIC STENT PLACEMENT BALLOON DILATION  Patient Location: PACU  Anesthesia Type:General  Level of Consciousness: awake, alert , oriented and patient cooperative  Airway & Oxygen Therapy: Patient Spontanous Breathing and Patient connected to face mask oxygen  Post-op Assessment: Report given to RN, Post -op Vital signs reviewed and stable and Patient moving all extremities X 4  Post vital signs: Reviewed and stable  Last Vitals:  Vitals Value Taken Time  BP 166/72 08/27/21 1602  Temp    Pulse 71 08/27/21 1603  Resp 14 08/27/21 1603  SpO2 96 % 08/27/21 1603  Vitals shown include unvalidated device data.  Last Pain:  Vitals:   08/27/21 1426  TempSrc:   PainSc: 0-No pain         Complications: No notable events documented.

## 2021-08-27 NOTE — Anesthesia Preprocedure Evaluation (Signed)
Anesthesia Evaluation  Patient identified by MRN, date of birth, ID band Patient awake    Reviewed: Allergy & Precautions, NPO status , Patient's Chart, lab work & pertinent test results, reviewed documented beta blocker date and time   Airway Mallampati: II  TM Distance: >3 FB Neck ROM: Full    Dental  (+) Teeth Intact, Dental Advisory Given   Pulmonary PE (9/22)   Pulmonary exam normal breath sounds clear to auscultation       Cardiovascular hypertension, Pt. on home beta blockers Normal cardiovascular exam Rhythm:Regular Rate:Normal     Neuro/Psych PSYCHIATRIC DISORDERS Anxiety Depression negative neurological ROS     GI/Hepatic GERD  Medicated,Pancreatic pseudocyst Pancreatic adenocarcinoma    Endo/Other  negative endocrine ROS  Renal/GU negative Renal ROS     Musculoskeletal  (+) Arthritis ,   Abdominal   Peds  Hematology  (+) Blood dyscrasia, anemia ,   Anesthesia Other Findings Day of surgery medications reviewed with the patient.  Reproductive/Obstetrics                             Anesthesia Physical Anesthesia Plan  ASA: 4  Anesthesia Plan:    Post-op Pain Management:    Induction: Intravenous  PONV Risk Score and Plan: 2 and Treatment may vary due to age or medical condition  Airway Management Planned:   Additional Equipment:   Intra-op Plan:   Post-operative Plan:   Informed Consent: I have reviewed the patients History and Physical, chart, labs and discussed the procedure including the risks, benefits and alternatives for the proposed anesthesia with the patient or authorized representative who has indicated his/her understanding and acceptance.     Dental advisory given  Plan Discussed with: CRNA  Anesthesia Plan Comments:         Anesthesia Quick Evaluation

## 2021-08-27 NOTE — Plan of Care (Signed)
  Problem: Education: Goal: Knowledge of General Education information will improve Description: Including pain rating scale, medication(s)/side effects and non-pharmacologic comfort measures Outcome: Progressing   Problem: Health Behavior/Discharge Planning: Goal: Ability to manage health-related needs will improve Outcome: Progressing   Problem: Clinical Measurements: Goal: Ability to maintain clinical measurements within normal limits will improve Outcome: Progressing Goal: Will remain free from infection Outcome: Progressing Goal: Diagnostic test results will improve Outcome: Progressing Goal: Cardiovascular complication will be avoided Outcome: Progressing   Problem: Activity: Goal: Risk for activity intolerance will decrease Outcome: Progressing   Problem: Coping: Goal: Level of anxiety will decrease Outcome: Progressing   Problem: Elimination: Goal: Will not experience complications related to bowel motility Outcome: Progressing Goal: Will not experience complications related to urinary retention Outcome: Progressing   Problem: Safety: Goal: Ability to remain free from injury will improve Outcome: Progressing   Problem: Skin Integrity: Goal: Risk for impaired skin integrity will decrease Outcome: Progressing   

## 2021-08-28 ENCOUNTER — Encounter (HOSPITAL_COMMUNITY): Payer: Self-pay | Admitting: Gastroenterology

## 2021-08-28 DIAGNOSIS — K863 Pseudocyst of pancreas: Secondary | ICD-10-CM | POA: Diagnosis not present

## 2021-08-28 DIAGNOSIS — K2100 Gastro-esophageal reflux disease with esophagitis, without bleeding: Secondary | ICD-10-CM | POA: Diagnosis not present

## 2021-08-28 LAB — CBC WITH DIFFERENTIAL/PLATELET
Abs Immature Granulocytes: 0.11 10*3/uL — ABNORMAL HIGH (ref 0.00–0.07)
Basophils Absolute: 0 10*3/uL (ref 0.0–0.1)
Basophils Relative: 0 %
Eosinophils Absolute: 0 10*3/uL (ref 0.0–0.5)
Eosinophils Relative: 0 %
HCT: 32.1 % — ABNORMAL LOW (ref 36.0–46.0)
Hemoglobin: 10.2 g/dL — ABNORMAL LOW (ref 12.0–15.0)
Immature Granulocytes: 2 %
Lymphocytes Relative: 16 %
Lymphs Abs: 1.1 10*3/uL (ref 0.7–4.0)
MCH: 30.2 pg (ref 26.0–34.0)
MCHC: 31.8 g/dL (ref 30.0–36.0)
MCV: 95 fL (ref 80.0–100.0)
Monocytes Absolute: 0.6 10*3/uL (ref 0.1–1.0)
Monocytes Relative: 9 %
Neutro Abs: 5.1 10*3/uL (ref 1.7–7.7)
Neutrophils Relative %: 73 %
Platelets: 337 10*3/uL (ref 150–400)
RBC: 3.38 MIL/uL — ABNORMAL LOW (ref 3.87–5.11)
RDW: 13.2 % (ref 11.5–15.5)
WBC: 6.9 10*3/uL (ref 4.0–10.5)
nRBC: 0 % (ref 0.0–0.2)

## 2021-08-28 LAB — COMPREHENSIVE METABOLIC PANEL
ALT: 15 U/L (ref 0–44)
AST: 14 U/L — ABNORMAL LOW (ref 15–41)
Albumin: 2.3 g/dL — ABNORMAL LOW (ref 3.5–5.0)
Alkaline Phosphatase: 54 U/L (ref 38–126)
Anion gap: 8 (ref 5–15)
BUN: 7 mg/dL — ABNORMAL LOW (ref 8–23)
CO2: 26 mmol/L (ref 22–32)
Calcium: 8.1 mg/dL — ABNORMAL LOW (ref 8.9–10.3)
Chloride: 103 mmol/L (ref 98–111)
Creatinine, Ser: 0.63 mg/dL (ref 0.44–1.00)
GFR, Estimated: >60 mL/min (ref >60)
Glucose, Bld: 90 mg/dL (ref 70–99)
Potassium: 3.7 mmol/L (ref 3.5–5.1)
Sodium: 137 mmol/L (ref 135–145)
Total Bilirubin: 0.6 mg/dL (ref 0.3–1.2)
Total Protein: 5.3 g/dL — ABNORMAL LOW (ref 6.5–8.1)

## 2021-08-28 LAB — MAGNESIUM: Magnesium: 1.7 mg/dL (ref 1.7–2.4)

## 2021-08-28 MED ORDER — PROCHLORPERAZINE EDISYLATE 10 MG/2ML IJ SOLN
10.0000 mg | Freq: Four times a day (QID) | INTRAMUSCULAR | Status: DC | PRN
  Administered 2021-08-28 (×2): 10 mg via INTRAVENOUS
  Filled 2021-08-28 (×2): qty 2

## 2021-08-28 MED ORDER — SUCRALFATE 1 GM/10ML PO SUSP
1.0000 g | Freq: Three times a day (TID) | ORAL | Status: DC | PRN
  Administered 2021-08-29: 1 g via ORAL
  Filled 2021-08-28: qty 10

## 2021-08-28 MED ORDER — POLYETHYLENE GLYCOL 3350 17 G PO PACK
17.0000 g | PACK | Freq: Every day | ORAL | Status: DC
  Administered 2021-08-28 – 2021-08-31 (×4): 17 g via ORAL
  Filled 2021-08-28 (×4): qty 1

## 2021-08-28 MED ORDER — PANTOPRAZOLE SODIUM 40 MG IV SOLR: 40.0000 mg | Freq: Two times a day (BID) | INTRAVENOUS | Status: DC

## 2021-08-28 NOTE — Care Management Important Message (Signed)
Important Message  Patient Details  Name: Sheryl Bender MRN: [id number] Date of Birth: 09/19/1945   Medicare Important Message Given:  Yes     Memory Argue 08/28/2021, 1:45 PM

## 2021-08-28 NOTE — Progress Notes (Signed)
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  Last abdominal imaging was MRCP on 08/03/2021 which showed enlargement of the pseudocyst when compared to June imaging. She initially presented to [facility] and underwent CT abdomen/pelvis which showed enlargement of pseudocyst with mass effect.  Films were reviewed by GI after patient was transferred to [facility]. She underwent EUS with cystgastrostomy creation and AXIOS stent placement with GI on 08/27/21.   Interval History:  Underwent cystgastrostomy via EUS on 8/3.  This morning she is still somewhat nauseous and not wanting to take in much liquid.  Otherwise seems to be resting comfortably.  Assessment and Plan:  Abdominal pain Pancreatic pseudocyst - GI following, appreciate assistance - Sovah images reviewed by GI - s/p EUS with cystgastrostomy creation and AXIOS stent placement with GI on 08/27/21 - Continue fluids, nausea, pain control - no VTE ppx for 48 hrs post procedure -Slow diet advancement per GI - Repeat CT A/P in approximately 3 weeks with stent pulling 1-2 weeks later if cyst has decompressed per GI - continue cipro x 3 days; cultures growing GPR/GPC in pairs and may need longer course still - protonix increased but EUS report states d/c PPI to allow acid to enter cyst cavity   Pancreatic CA T1N0, further treatment pending as an outpatient   Anxiety and depression - home meds reviewed with patient as she is on a large amount of psychotropics - resumed home regimen in efforts to prevent withdrawal    H/oh PE September 2022 - SCD for now - no longer on anticoagulation   Old records reviewed in assessment of this patient  Antimicrobials:   DVT  prophylaxis:  SCDs Start: 08/23/21 2014   Code Status:   Code Status: Full Code  Mobility Assessment (last 72 hours)     Mobility Assessment     Row Name 08/27/21 2151 08/27/21 0834 08/26/21 2000       Does patient have an order for bedrest or is patient medically unstable No - Continue assessment No - Continue assessment No - Continue assessment     What is the highest level of mobility based on the progressive mobility assessment? Level 5 (Walks with assist in room/hall) - Balance while stepping forward/back and can walk in room with assist - Complete Level 5 (Walks with assist in room/hall) - Balance while stepping forward/back and can walk in room with assist - Complete Level 5 (Walks with assist in room/hall) - Balance while stepping forward/back and can walk in room with assist - Complete              Disposition Plan:  Home 2-3 days Status is: Inpt  Objective: Blood pressure 126/69, pulse 72, temperature 98.7 F (37.1 C), temperature source Oral, resp. rate 18, height '5\' 2"'$  (1.575 m), weight 59.8 kg, SpO2 92 %.  Examination:  Physical Exam Constitutional:      Appearance: Normal appearance.  HENT:     Head: Normocephalic and atraumatic.     Mouth/Throat:     Mouth: Mucous membranes are moist.  Eyes:     Extraocular Movements: Extraocular movements intact.  Cardiovascular:     Rate and Rhythm: Normal rate and regular rhythm.  Pulmonary:     Effort:  Pulmonary effort is normal.     Breath sounds: Normal breath sounds.  Abdominal:     General: Bowel sounds are normal. There is no distension.     Palpations: Abdomen is soft.     Tenderness: There is abdominal tenderness.  Musculoskeletal:        General: Normal range of motion.     Cervical back: Normal range of motion and neck supple.  Skin:    General: Skin is warm and dry.  Neurological:     General: No focal deficit present.     Mental Status: She is alert.  Psychiatric:        Mood and Affect: Mood  normal.      Consultants:  GI  Procedures:  EUS with cystgastrostomy creation and AXIOS stent placement with GI on 08/27/21  Data Reviewed: Results for orders placed or performed during the hospital encounter of 08/23/21 (from the past 24 hour(s))  Aerobic/Anaerobic Culture w Gram Stain (surgical/deep wound)     Status: None (Preliminary result)   Collection Time: 08/27/21  3:40 PM   Specimen: PATH GI biopsy; Body Fluid  Result Value Ref Range   Specimen Description      FLUID Performed at [facility] [address] [place], [place] [address]    Special Requests      NONE PANCREATIC PSEUDOCYST ASPIRATE Performed at [facility] [address] [place], [place] [address]    Gram Stain      NO WBC SEEN RARE GRAM POSITIVE RODS FEW GRAM POSITIVE COCCI IN PAIRS    Culture      TOO YOUNG TO READ Performed at [facility], [place] [address] [place], [place] [address]    Report Status PENDING   CBC with Differential/Platelet     Status: Abnormal   Collection Time: 08/28/21  4:52 AM  Result Value Ref Range   WBC 6.9 4.0 - 10.5 K/uL   RBC 3.38 (L) 3.87 - 5.11 MIL/uL   Hemoglobin 10.2 (L) 12.0 - 15.0 g/dL   HCT 32.1 (L) 36.0 - 46.0 %   MCV 95.0 80.0 - 100.0 fL   MCH 30.2 26.0 - 34.0 pg   MCHC 31.8 30.0 - 36.0 g/dL   RDW 13.2 11.5 - 15.5 %   Platelets 337 150 - 400 K/uL   nRBC 0.0 0.0 - 0.2 %   Neutrophils Relative % 73 %   Neutro Abs 5.1 1.7 - 7.7 K/uL   Lymphocytes Relative 16 %   Lymphs Abs 1.1 0.7 - 4.0 K/uL   Monocytes Relative 9 %   Monocytes Absolute 0.6 0.1 - 1.0 K/uL   Eosinophils Relative 0 %   Eosinophils Absolute 0.0 0.0 - 0.5 K/uL   Basophils Relative 0 %   Basophils Absolute 0.0 0.0 - 0.1 K/uL   Immature Granulocytes 2 %   Abs Immature Granulocytes 0.11 (H) 0.00 - 0.07 K/uL  Magnesium     Status: None   Collection Time: 08/28/21  4:52 AM  Result Value Ref Range   Magnesium 1.7 1.7 - 2.4 mg/dL  Comprehensive  metabolic panel     Status: Abnormal   Collection Time: 08/28/21  4:52 AM  Result Value Ref Range   Sodium 137 135 - 145 mmol/L   Potassium 3.7 3.5 - 5.1 mmol/L   Chloride 103 98 - 111 mmol/L   CO2 26 22 - 32 mmol/L   Glucose, Bld 90 70 - 99 mg/dL   BUN  7 (L) 8 - 23 mg/dL   Creatinine, Ser 0.63 0.44 - 1.00 mg/dL   Calcium 8.1 (L) 8.9 - 10.3 mg/dL   Total Protein 5.3 (L) 6.5 - 8.1 g/dL   Albumin 2.3 (L) 3.5 - 5.0 g/dL   AST 14 (L) 15 - 41 U/L   ALT 15 0 - 44 U/L   Alkaline Phosphatase 54 38 - 126 U/L   Total Bilirubin 0.6 0.3 - 1.2 mg/dL   GFR, Estimated >60 >60 mL/min   Anion gap 8 5 - 15     I have Reviewed nursing notes, Vitals, and Lab results since pt's last encounter. Pertinent lab results : see above I have ordered test including BMP, CBC, Mg I have reviewed the last note from staff over past 24 hours I have discussed pt's care plan and test results with nursing staff, case manager   LOS: 5 days   Dwyane Dee, MD [facility] 08/28/2021, 1:48 PM

## 2021-08-28 NOTE — Progress Notes (Addendum)
[facility] Progress Note  CC:   Increasing pancreatic pseudocyst, epigastric pain with nausea and vomiting  Subjective: She continues to have upper abdominal pain with some nausea, small episode of vomiting.   She is taking small sips of clear liquids. Having increase GERD per patient. Some continuing upper AB pain but improved.  No BM but is passing gas per the rectum.  No chest pain or shortness of breath.    Objective:  Vital signs in last 24 hours: Temp:  [97.7 F (36.5 C)-99.5 F (37.5 C)] 98.7 F (37.1 C) (08/04 0521) Pulse Rate:  [65-138] 72 (08/04 0521) Resp:  [9-20] 18 (08/04 0521) BP: (126-166)/(69-77) 126/69 (08/04 0521) SpO2:  [90 %-100 %] 92 % (08/04 0521) Weight:  [59.8 kg] 59.8 kg (08/03 1602) Last BM Date : 08/26/21  General: Fatigued appearing 76 year old female in no acute distress. Heart: Regular rate and rhythm, no murmurs. Pulm: Breath sounds clear throughout. Abdomen: Soft, generalized abdominal tenderness without rebound or guarding.  Positive bowel sounds to all 4 quadrants. Extremities:  Without edema. Neurologic:  Alert and  oriented x 4. Grossly normal neurologically. Psych:  Alert and cooperative. Normal mood and affect.  Intake/Output from previous day: 08/03 0701 - 08/04 0700 In: 840 [P.O.:240; I.V.:400; IV Piggyback:200] Out: 2450 [Urine:2450] Intake/Output this shift: Total I/O In: -  Out: 500 [Urine:500]  Lab Results: Recent Labs    08/27/21 0514 08/28/21 0452  WBC 3.5* 6.9  HGB 10.5* 10.2*  HCT 32.5* 32.1*  PLT 323 337   BMET Recent Labs    08/27/21 0514 08/28/21 0452  NA 143 137  K 3.9 3.7  CL 109 103  CO2 28 26  GLUCOSE 80 90  BUN 6* 7*  CREATININE 0.67 0.63  CALCIUM 8.6* 8.1*   LFT Recent Labs    08/28/21 0452  PROT 5.3*  ALBUMIN 2.3*  AST 14*  ALT 15  ALKPHOS 54  BILITOT 0.6   PT/INR No results for input(s): "LABPROT", "INR" in the last 72 hours.  Hepatitis Panel No results for  input(s): "HEPBSAG", "HCVAB", "HEPAIGM", "HEPBIGM" in the last 72 hours.  No results found.  Assessment / Plan:  43) 76 year old female with pancreatic adenocarcinoma S/P EUS 07/09/2021, FNA confirmed adenocarcinoma. 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   Followed by Dr. Zenia Resides for possible hepatobiliary surgery and Dr. Delton Coombes with oncology.  She was admitted to [facility] 08/23/2021 with worsening N/V and abdominal pain. Repeat CT showed enlargement of her pseudocyst with mass effect. Transferred to [facility] 08/24/2021 for possible cystogastrostomy.  S/p Cystgastrostomy with Dr. Rush Landmark .08/03 Stent in place. Will set up for outpatient CT next 2 to 3 weeks for follow-up, patient will need repeat endoscopic procedure outpatient. We will set this up with her office. Clear liquids advance a full liquids and then to soft diet over the next 48 to 72 hours. No VT prophylaxis for at least 24 hours.  Pain management per the hospitalist Continue IV fluid  Await further recommendations per Dr. Candis Schatz   2) GERD-planing of some worsening reflux this morning with some nausea and vomiting of bile -Increase pantoprazole to every 12 hours, added on Carafate as needed.   3) PE 09/2020, not on anticoagulation -Hold for at least another 24 hours.  4) constipation Added on MiraLAX once daily, Dulcolax suppository as needed.  Principal Problem:   Pancreatic pseudocyst Active Problems:   GERD (gastroesophageal reflux disease)   Depression  Epigastric pain   Nausea & vomiting   Malnutrition of moderate degree     LOS: 5 days   Sheryl Bender  08/28/2021, 11:52 AM  -----------------------------------------------------------------------------------------  I have taken a history, reviewed the chart and examined the patient. I performed a substantive portion of this encounter, including complete performance of at least one of the key components, in  conjunction with the APP. I agree with the APP's note, impression and recommendations  Patient doing fairly well following her cystgastrostomy.   Overall her symptoms of pain and nausea are improved, but she continues to have little appetite and is tolerating only small amounts of clear liquids currently.  Expect that this will improve.   Patient is having increased GERD symptoms but recommendations for limiting acid suppression to improve cyst drainage noted.  Will discontinue Protonix and try antacids PRN alone.  If symptoms intolerable off PPI, may need to restart.  Esperansa Sarabia E. Candis Schatz, MD [facility]

## 2021-08-29 DIAGNOSIS — K2100 Gastro-esophageal reflux disease with esophagitis, without bleeding: Secondary | ICD-10-CM | POA: Diagnosis not present

## 2021-08-29 DIAGNOSIS — K863 Pseudocyst of pancreas: Secondary | ICD-10-CM | POA: Diagnosis not present

## 2021-08-29 LAB — COMPREHENSIVE METABOLIC PANEL
ALT: 12 U/L (ref 0–44)
AST: 12 U/L — ABNORMAL LOW (ref 15–41)
Albumin: 2.4 g/dL — ABNORMAL LOW (ref 3.5–5.0)
Alkaline Phosphatase: 56 U/L (ref 38–126)
Anion gap: 8 (ref 5–15)
BUN: 5 mg/dL — ABNORMAL LOW (ref 8–23)
CO2: 28 mmol/L (ref 22–32)
Calcium: 8 mg/dL — ABNORMAL LOW (ref 8.9–10.3)
Chloride: 103 mmol/L (ref 98–111)
Creatinine, Ser: 0.75 mg/dL (ref 0.44–1.00)
GFR, Estimated: >60 mL/min (ref >60)
Glucose, Bld: 102 mg/dL — ABNORMAL HIGH (ref 70–99)
Potassium: 3.4 mmol/L — ABNORMAL LOW (ref 3.5–5.1)
Sodium: 139 mmol/L (ref 135–145)
Total Bilirubin: 0.4 mg/dL (ref 0.3–1.2)
Total Protein: 5.3 g/dL — ABNORMAL LOW (ref 6.5–8.1)

## 2021-08-29 LAB — CBC WITH DIFFERENTIAL/PLATELET
Abs Immature Granulocytes: 0.11 10*3/uL — ABNORMAL HIGH (ref 0.00–0.07)
Basophils Absolute: 0 10*3/uL (ref 0.0–0.1)
Basophils Relative: 0 %
Eosinophils Absolute: 0.1 10*3/uL (ref 0.0–0.5)
Eosinophils Relative: 1 %
HCT: 32.8 % — ABNORMAL LOW (ref 36.0–46.0)
Hemoglobin: 10.4 g/dL — ABNORMAL LOW (ref 12.0–15.0)
Immature Granulocytes: 1 %
Lymphocytes Relative: 9 %
Lymphs Abs: 0.9 10*3/uL (ref 0.7–4.0)
MCH: 30.7 pg (ref 26.0–34.0)
MCHC: 31.7 g/dL (ref 30.0–36.0)
MCV: 96.8 fL (ref 80.0–100.0)
Monocytes Absolute: 0.8 10*3/uL (ref 0.1–1.0)
Monocytes Relative: 8 %
Neutro Abs: 8 10*3/uL — ABNORMAL HIGH (ref 1.7–7.7)
Neutrophils Relative %: 81 %
Platelets: 320 10*3/uL (ref 150–400)
RBC: 3.39 MIL/uL — ABNORMAL LOW (ref 3.87–5.11)
RDW: 13.8 % (ref 11.5–15.5)
WBC: 9.9 10*3/uL (ref 4.0–10.5)
nRBC: 0 % (ref 0.0–0.2)

## 2021-08-29 LAB — MAGNESIUM: Magnesium: 1.9 mg/dL (ref 1.7–2.4)

## 2021-08-29 MED ORDER — POTASSIUM CHLORIDE 20 MEQ PO PACK
40.0000 meq | PACK | Freq: Once | ORAL | Status: AC
  Administered 2021-08-29: 40 meq via ORAL
  Filled 2021-08-29: qty 2

## 2021-08-29 MED ORDER — CIPROFLOXACIN HCL 500 MG PO TABS
500.0000 mg | ORAL_TABLET | Freq: Two times a day (BID) | ORAL | Status: AC
Start: 2021-08-29 — End: 2021-08-30
  Administered 2021-08-29 – 2021-08-30 (×3): 500 mg via ORAL
  Filled 2021-08-29 (×3): qty 1

## 2021-08-29 NOTE — Progress Notes (Signed)
Progress Note    Sheryl Bender   [id number]  DOB: [birth year]  DOA: 08/23/2021     6 PCP: Sherrilee Gilles, DO  Initial CC: Abdominal pain  Hospital Course: Ms. 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  Last abdominal imaging was MRCP on 08/03/2021 which showed enlargement of the pseudocyst when compared to June imaging. She initially presented to [place] and underwent CT abdomen/pelvis which showed enlargement of pseudocyst with mass effect.  Films were reviewed by GI after patient was transferred to [facility]. She underwent EUS with cystgastrostomy creation and AXIOS stent placement with GI on 08/27/21.   Interval History:  Underwent cystgastrostomy via EUS on 8/3.   Actually looks to be feeling a little better today.  Denying as much indigestion/reflux today.  Assessment and Plan:  Abdominal pain Pancreatic pseudocyst - GI following, appreciate assistance - Sovah images reviewed by GI - s/p EUS with cystgastrostomy creation and AXIOS stent placement with GI on 08/27/21 - Continue fluids, nausea, pain control - no VTE ppx for 48 hrs post procedure -Slow diet advancement per GI - Repeat CT A/P in approximately 3 weeks with stent pulling 1-2 weeks later if cyst has decompressed per GI - continue cipro x 3 days; cultures growing GPR/GPC in pairs and may need longer course still -Patient informed that we are trying to hold PPI if able but we will restart if necessary.  For now she says her reflux feels better today and is okay holding Protonix and will attempt asking for the Maalox or Tums PRN   Pancreatic CA T1N0, further treatment pending as an outpatient   Anxiety and depression - home meds reviewed with patient as she is on a large amount of psychotropics - resumed home regimen in efforts to prevent withdrawal    H/oh PE September 2022 - SCD for now - no longer on  anticoagulation   Old records reviewed in assessment of this patient  Antimicrobials: Cipro 8/3 >> current  DVT prophylaxis:  SCDs Start: 08/23/21 2014   Code Status:   Code Status: Full Code  Mobility Assessment (last 72 hours)     Mobility Assessment     Row Name 08/27/21 2151 08/27/21 0834 08/26/21 2000       Does patient have an order for bedrest or is patient medically unstable No - Continue assessment No - Continue assessment No - Continue assessment     What is the highest level of mobility based on the progressive mobility assessment? Level 5 (Walks with assist in room/hall) - Balance while stepping forward/back and can walk in room with assist - Complete Level 5 (Walks with assist in room/hall) - Balance while stepping forward/back and can walk in room with assist - Complete Level 5 (Walks with assist in room/hall) - Balance while stepping forward/back and can walk in room with assist - Complete              Disposition Plan:  Home 2-3 days Status is: Inpt  Objective: Blood pressure 124/67, pulse 76, temperature 98.3 F (36.8 C), temperature source Oral, resp. rate 20, height '5\' 2"'$  (1.575 m), weight 60.2 kg, SpO2 93 %.  Examination:  Physical Exam Constitutional:      Appearance: Normal appearance.  HENT:     Head: Normocephalic and atraumatic.     Mouth/Throat:     Mouth: Mucous membranes are moist.  Eyes:     Extraocular Movements: Extraocular  movements intact.  Cardiovascular:     Rate and Rhythm: Normal rate and regular rhythm.  Pulmonary:     Effort: Pulmonary effort is normal.     Breath sounds: Normal breath sounds.  Abdominal:     General: Bowel sounds are normal. There is no distension.     Palpations: Abdomen is soft.     Tenderness: There is abdominal tenderness.  Musculoskeletal:        General: Normal range of motion.     Cervical back: Normal range of motion and neck supple.  Skin:    General: Skin is warm and dry.  Neurological:      General: No focal deficit present.     Mental Status: She is alert.  Psychiatric:        Mood and Affect: Mood normal.      Consultants:  GI  Procedures:  EUS with cystgastrostomy creation and AXIOS stent placement with GI on 08/27/21  Data Reviewed: Results for orders placed or performed during the hospital encounter of 08/23/21 (from the past 24 hour(s))  CBC with Differential/Platelet     Status: Abnormal   Collection Time: 08/29/21  5:48 AM  Result Value Ref Range   WBC 9.9 4.0 - 10.5 K/uL   RBC 3.39 (L) 3.87 - 5.11 MIL/uL   Hemoglobin 10.4 (L) 12.0 - 15.0 g/dL   HCT 32.8 (L) 36.0 - 46.0 %   MCV 96.8 80.0 - 100.0 fL   MCH 30.7 26.0 - 34.0 pg   MCHC 31.7 30.0 - 36.0 g/dL   RDW 13.8 11.5 - 15.5 %   Platelets 320 150 - 400 K/uL   nRBC 0.0 0.0 - 0.2 %   Neutrophils Relative % 81 %   Neutro Abs 8.0 (H) 1.7 - 7.7 K/uL   Lymphocytes Relative 9 %   Lymphs Abs 0.9 0.7 - 4.0 K/uL   Monocytes Relative 8 %   Monocytes Absolute 0.8 0.1 - 1.0 K/uL   Eosinophils Relative 1 %   Eosinophils Absolute 0.1 0.0 - 0.5 K/uL   Basophils Relative 0 %   Basophils Absolute 0.0 0.0 - 0.1 K/uL   Immature Granulocytes 1 %   Abs Immature Granulocytes 0.11 (H) 0.00 - 0.07 K/uL  Magnesium     Status: None   Collection Time: 08/29/21  5:48 AM  Result Value Ref Range   Magnesium 1.9 1.7 - 2.4 mg/dL  Comprehensive metabolic panel     Status: Abnormal   Collection Time: 08/29/21  5:48 AM  Result Value Ref Range   Sodium 139 135 - 145 mmol/L   Potassium 3.4 (L) 3.5 - 5.1 mmol/L   Chloride 103 98 - 111 mmol/L   CO2 28 22 - 32 mmol/L   Glucose, Bld 102 (H) 70 - 99 mg/dL   BUN 5 (L) 8 - 23 mg/dL   Creatinine, Ser 0.75 0.44 - 1.00 mg/dL   Calcium 8.0 (L) 8.9 - 10.3 mg/dL   Total Protein 5.3 (L) 6.5 - 8.1 g/dL   Albumin 2.4 (L) 3.5 - 5.0 g/dL   AST 12 (L) 15 - 41 U/L   ALT 12 0 - 44 U/L   Alkaline Phosphatase 56 38 - 126 U/L   Total Bilirubin 0.4 0.3 - 1.2 mg/dL   GFR, Estimated >60 >60 mL/min    Anion gap 8 5 - 15     I have Reviewed nursing notes, Vitals, and Lab results since pt's last encounter. Pertinent lab results : see above I  have ordered test including BMP, CBC, Mg I have reviewed the last note from staff over past 24 hours I have discussed pt's care plan and test results with nursing staff, case manager   LOS: 6 days   Dwyane Dee, MD [facility] 08/29/2021, 12:41 PM

## 2021-08-29 NOTE — Progress Notes (Signed)
[name] ROUNDING NOTE   Subjective: Doing better today.  Minimal nausea, less discomfort.  Tolerating higher volumes of CLD, but still hesitant to consume large amounts. Small BM yesterday No f/c.   Objective: Vital signs in last 24 hours: Temp:  [98 F (36.7 C)-99.1 F (37.3 C)] 98 F (36.7 C) (08/05 1333) Pulse Rate:  [65-76] 75 (08/05 1333) Resp:  [16-20] 19 (08/05 1333) BP: (124-152)/(67-73) 124/72 (08/05 1333) SpO2:  [92 %-93 %] 92 % (08/05 1333) Weight:  [60.2 kg] 60.2 kg (08/05 0507) Last BM Date : 08/26/21 General: NAD, pleasant Caucasian fm, lying bed, comfortable.  Son at bedisde Lungs:  CTA b/l, no w/r/r Heart:  RRR, no m/r/g Abdomen:  Soft, NT, ND, +BS Ext:  No c/c/e    Intake/Output from previous day: 08/04 0701 - 08/05 0700 In: 577 [P.O.:577] Out: 1700 [Urine:1700] Intake/Output this shift: Total I/O In: -  Out: 600 [Urine:600]   Lab Results: Recent Labs    08/27/21 0514 08/28/21 0452 08/29/21 0548  WBC 3.5* 6.9 9.9  HGB 10.5* 10.2* 10.4*  PLT 323 337 320  MCV 95.9 95.0 96.8   BMET Recent Labs    08/27/21 0514 08/28/21 0452 08/29/21 0548  NA 143 137 139  K 3.9 3.7 3.4*  CL 109 103 103  CO2 '28 26 28  '$ GLUCOSE 80 90 102*  BUN 6* 7* 5*  CREATININE 0.67 0.63 0.75  CALCIUM 8.6* 8.1* 8.0*   LFT Recent Labs    08/28/21 0452 08/29/21 0548  PROT 5.3* 5.3*  ALBUMIN 2.3* 2.4*  AST 14* 12*  ALT 15 12  ALKPHOS 54 56  BILITOT 0.6 0.4   PT/INR No results for input(s): "INR" in the last 72 hours.    Imaging/Other results: No results found.    Assessment and Plan: 72) 76 year old female with pancreatic adenocarcinoma S/P EUS 07/09/2021, FNA confirmed adenocarcinoma. 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   Followed by Dr. Zenia Resides for possible hepatobiliary surgery and Dr. Delton Coombes with oncology.  She was admitted to [facility] 08/23/2021 with worsening N/V and abdominal pain.  Repeat CT showed enlargement of her pseudocyst with mass effect. Transferred to [facility] 08/24/2021 for possible cystogastrostomy.  S/p Cystgastrostomy with Dr. Rush Landmark .08/03 Stent in place. Will set up for outpatient CT next 2 to 3 weeks for follow-up, patient will need repeat endoscopic procedure outpatient. We will set this up with her office. Plan to advance diet to soft diet tomorrow Continue Ciprofloxacin one more day   2) GERD Minimal symptoms today Continue PRN Maalox/TUMS Holding PPI while cystgastrostomy in place   3) PE 09/2020, not on anticoagulation - Ok to restart anticoagulation tomorrow   4) constipation Continue MiraLAX once daily, Dulcolax suppository as needed.     Daryel November, MD  08/29/2021, 3:52 PM [facility]

## 2021-08-30 DIAGNOSIS — F32A Depression, unspecified: Secondary | ICD-10-CM | POA: Diagnosis not present

## 2021-08-30 DIAGNOSIS — K863 Pseudocyst of pancreas: Secondary | ICD-10-CM | POA: Diagnosis not present

## 2021-08-30 LAB — CBC WITH DIFFERENTIAL/PLATELET
Abs Immature Granulocytes: 0.15 10*3/uL — ABNORMAL HIGH (ref 0.00–0.07)
Basophils Absolute: 0 10*3/uL (ref 0.0–0.1)
Basophils Relative: 0 %
Eosinophils Absolute: 0.2 10*3/uL (ref 0.0–0.5)
Eosinophils Relative: 2 %
HCT: 32.6 % — ABNORMAL LOW (ref 36.0–46.0)
Hemoglobin: 10.5 g/dL — ABNORMAL LOW (ref 12.0–15.0)
Immature Granulocytes: 2 %
Lymphocytes Relative: 15 %
Lymphs Abs: 1.4 10*3/uL (ref 0.7–4.0)
MCH: 31.3 pg (ref 26.0–34.0)
MCHC: 32.2 g/dL (ref 30.0–36.0)
MCV: 97.3 fL (ref 80.0–100.0)
Monocytes Absolute: 0.8 10*3/uL (ref 0.1–1.0)
Monocytes Relative: 8 %
Neutro Abs: 6.9 10*3/uL (ref 1.7–7.7)
Neutrophils Relative %: 73 %
Platelets: 310 10*3/uL (ref 150–400)
RBC: 3.35 MIL/uL — ABNORMAL LOW (ref 3.87–5.11)
RDW: 14.3 % (ref 11.5–15.5)
WBC: 9.5 10*3/uL (ref 4.0–10.5)
nRBC: 0 % (ref 0.0–0.2)

## 2021-08-30 LAB — COMPREHENSIVE METABOLIC PANEL
ALT: 12 U/L (ref 0–44)
AST: 12 U/L — ABNORMAL LOW (ref 15–41)
Albumin: 2.4 g/dL — ABNORMAL LOW (ref 3.5–5.0)
Alkaline Phosphatase: 58 U/L (ref 38–126)
Anion gap: 5 (ref 5–15)
BUN: <5 mg/dL — ABNORMAL LOW (ref 8–23)
CO2: 27 mmol/L (ref 22–32)
Calcium: 8.6 mg/dL — ABNORMAL LOW (ref 8.9–10.3)
Chloride: 112 mmol/L — ABNORMAL HIGH (ref 98–111)
Creatinine, Ser: 0.69 mg/dL (ref 0.44–1.00)
GFR, Estimated: >60 mL/min (ref >60)
Glucose, Bld: 90 mg/dL (ref 70–99)
Potassium: 3.6 mmol/L (ref 3.5–5.1)
Sodium: 144 mmol/L (ref 135–145)
Total Bilirubin: 0.4 mg/dL (ref 0.3–1.2)
Total Protein: 5.5 g/dL — ABNORMAL LOW (ref 6.5–8.1)

## 2021-08-30 LAB — MAGNESIUM: Magnesium: 2 mg/dL (ref 1.7–2.4)

## 2021-08-30 MED ORDER — ENOXAPARIN SODIUM 40 MG/0.4ML IJ SOSY
40.0000 mg | PREFILLED_SYRINGE | INTRAMUSCULAR | Status: DC
  Administered 2021-08-30: 40 mg via SUBCUTANEOUS
  Filled 2021-08-30: qty 0.4

## 2021-08-30 NOTE — Progress Notes (Signed)
[facility] ROUNDING NOTE   Subjective: Patient continues to improve from a GI standpoint.  Nausea and abdominal pain greatly improved.  No vomiting.  She tolerated a soft diet this morning.  She remains concerned about some restless leg symptoms and night sweats last night.  She is concerned she may have serotonin syndrome, I provided reassurance but deferred any further evaluation to her hospitalist. Patient had bowel movement today.  No GERD symptoms off PPI.   Objective: Vital signs in last 24 hours: Temp:  [98.7 F (37.1 C)] 98.7 F (37.1 C) (08/06 0619) Pulse Rate:  [73-74] 73 (08/06 0619) Resp:  [16-18] 16 (08/06 0619) BP: (119-128)/(65-71) 128/71 (08/06 0619) SpO2:  [91 %-93 %] 91 % (08/06 0619) Weight:  [60.1 kg] 60.1 kg (08/06 0700) Last BM Date : 08/29/21 General: NAD, pleasant Caucasian female Lungs:  CTA b/l, no w/r/r Heart:  RRR, no m/r/g Abdomen:  Soft, NT, ND, +BS Ext:  No c/c/e    Intake/Output from previous day: 08/05 0701 - 08/06 0700 In: -  Out: 2950 [Urine:2950] Intake/Output this shift: Total I/O In: -  Out: 200 [Urine:200]   Lab Results: Recent Labs    08/28/21 0452 08/29/21 0548 08/30/21 0612  WBC 6.9 9.9 9.5  HGB 10.2* 10.4* 10.5*  PLT 337 320 310  MCV 95.0 96.8 97.3   BMET Recent Labs    08/28/21 0452 08/29/21 0548 08/30/21 0612  NA 137 139 144  K 3.7 3.4* 3.6  CL 103 103 112*  CO2 '26 28 27  '$ GLUCOSE 90 102* 90  BUN 7* 5* <5*  CREATININE 0.63 0.75 0.69  CALCIUM 8.1* 8.0* 8.6*   LFT Recent Labs    08/28/21 0452 08/29/21 0548 08/30/21 0612  PROT 5.3* 5.3* 5.5*  ALBUMIN 2.3* 2.4* 2.4*  AST 14* 12* 12*  ALT '15 12 12  '$ ALKPHOS 54 56 58  BILITOT 0.6 0.4 0.4   PT/INR No results for input(s): "INR" in the last 72 hours.    Imaging/Other results: No results found.    Assessment and Plan:  57) 76 year old female with pancreatic adenocarcinoma S/P EUS 07/09/2021, FNA confirmed adenocarcinoma. Post EUS  pancreatitis complicated by the development of a large pseudocyst.   Followed by Dr. Zenia Resides for possible hepatobiliary surgery and Dr. Delton Coombes with oncology.  She was admitted to [facility] 08/23/2021 with worsening N/V and abdominal pain. Repeat CT showed enlargement of her pseudocyst with mass effect. Transferred to [facility] 08/24/2021 for possible cystogastrostomy.  S/p Cystgastrostomy with Dr. Rush Landmark 08/03 Stent in place. Will set up for outpatient CT next 2 to 3 weeks for follow-up, patient will need repeat endoscopic procedure outpatient. We will set this up with her office. Continue soft diet Okay to discontinue ciprofloxacin today  Patient has follow-up appointment with her oncologist August 15     2) GERD Minimal symptoms off PPI Continue PRN Maalox/TUMS Holding PPI while cystgastrostomy in place   3) PE 09/2020, not on anticoagulation - Ok to restart anticoagulation today   4) constipation Continue MiraLAX once daily, Dulcolax suppository as needed.  GI will sign off at this time Dr. Carlean Purl will assume inpatient coverage tomorrow, please contact us with any further questions, concerns  Daryel November, MD  08/30/2021, 1:48 PM [facility]

## 2021-08-30 NOTE — Progress Notes (Signed)
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  Last abdominal imaging was MRCP on 08/03/2021 which showed enlargement of the pseudocyst when compared to June imaging. She initially presented to [place] and underwent CT abdomen/pelvis which showed enlargement of pseudocyst with mass effect.  Films were reviewed by GI after patient was transferred to [facility]. She underwent EUS with cystgastrostomy creation and AXIOS stent placement with GI on 08/27/21.   Interval History:  Underwent cystgastrostomy via EUS on 8/3.   Starting on soft diet today as she continues to feel improved.  Also tolerating no PPI still. She also expressed concern to me this morning for serotonin syndrome.  Discussed her symptoms with her and provided further reassurance that she does not describe warning signs of serotonin syndrome although she is on quite a bit of psychotropic medications.  I encouraged her to discuss further with her psychiatrist.  Assessment and Plan:  Abdominal pain Pancreatic pseudocyst - GI following, appreciate assistance - Sovah images reviewed by GI - s/p EUS with cystgastrostomy creation and AXIOS stent placement with GI on 08/27/21 - Continue fluids, nausea, pain control - no VTE ppx for 48 hrs post procedure -Slow diet advancement per GI - Repeat CT A/P in approximately 3 weeks with stent pulling 1-2 weeks later if cyst has decompressed per GI - continue cipro x 3 days; cultures reviewed. Course completed on 08/30/21 -Continues to tolerate being off of PPI.  Continue as is for now and if does develop symptoms, can always restart at that time   Pancreatic CA T1N0, further treatment pending as an outpatient   Anxiety and  depression - home meds reviewed with patient as she is on a large amount of psychotropics - resumed home regimen in efforts to prevent withdrawal  - patient worried about "serotonin syndrome" on 8/6 due to restless legs last night; I provided reassurance again for now; after reviewing regimen, can trial off of mirtazapine for now with close followup with psych    H/oh PE September 2022 - SCD for now - no longer on anticoagulation   Old records reviewed in assessment of this patient  Antimicrobials: Cipro 8/3 >> current  DVT prophylaxis:  enoxaparin (LOVENOX) injection 40 mg Start: 08/30/21 1500 SCDs Start: 08/23/21 2014  Code Status:   Code Status: Full Code  Mobility Assessment (last 72 hours)     Mobility Assessment     Row Name 08/27/21 2151           Does patient have an order for bedrest or is patient medically unstable No - Continue assessment       What is the highest level of mobility based on the progressive mobility assessment? Level 5 (Walks with assist in room/hall) - Balance while stepping forward/back and can walk in room with assist - Complete                Disposition Plan:  Home possibly Monday  Status is: Inpt  Objective: Blood pressure (!) 123/95, pulse 76, temperature 98.7 F (37.1 C), temperature source Oral, resp. rate 16, height '5\' 2"'$  (1.575 m), weight 60.1 kg, SpO2 (!) 89 %.  Examination:  Physical Exam Constitutional:      Appearance: Normal appearance.  HENT:  Head: Normocephalic and atraumatic.     Mouth/Throat:     Mouth: Mucous membranes are moist.  Eyes:     Extraocular Movements: Extraocular movements intact.  Cardiovascular:     Rate and Rhythm: Normal rate and regular rhythm.  Pulmonary:     Effort: Pulmonary effort is normal.     Breath sounds: Normal breath sounds.  Abdominal:     General: Bowel sounds are normal. There is no distension.     Palpations: Abdomen is soft.     Tenderness: There is abdominal tenderness.   Musculoskeletal:        General: Normal range of motion.     Cervical back: Normal range of motion and neck supple.  Skin:    General: Skin is warm and dry.  Neurological:     General: No focal deficit present.     Mental Status: She is alert.  Psychiatric:        Mood and Affect: Mood normal.      Consultants:  GI  Procedures:  EUS with cystgastrostomy creation and AXIOS stent placement with GI on 08/27/21  Data Reviewed: Results for orders placed or performed during the hospital encounter of 08/23/21 (from the past 24 hour(s))  CBC with Differential/Platelet     Status: Abnormal   Collection Time: 08/30/21  6:12 AM  Result Value Ref Range   WBC 9.5 4.0 - 10.5 K/uL   RBC 3.35 (L) 3.87 - 5.11 MIL/uL   Hemoglobin 10.5 (L) 12.0 - 15.0 g/dL   HCT 32.6 (L) 36.0 - 46.0 %   MCV 97.3 80.0 - 100.0 fL   MCH 31.3 26.0 - 34.0 pg   MCHC 32.2 30.0 - 36.0 g/dL   RDW 14.3 11.5 - 15.5 %   Platelets 310 150 - 400 K/uL   nRBC 0.0 0.0 - 0.2 %   Neutrophils Relative % 73 %   Neutro Abs 6.9 1.7 - 7.7 K/uL   Lymphocytes Relative 15 %   Lymphs Abs 1.4 0.7 - 4.0 K/uL   Monocytes Relative 8 %   Monocytes Absolute 0.8 0.1 - 1.0 K/uL   Eosinophils Relative 2 %   Eosinophils Absolute 0.2 0.0 - 0.5 K/uL   Basophils Relative 0 %   Basophils Absolute 0.0 0.0 - 0.1 K/uL   Immature Granulocytes 2 %   Abs Immature Granulocytes 0.15 (H) 0.00 - 0.07 K/uL  Magnesium     Status: None   Collection Time: 08/30/21  6:12 AM  Result Value Ref Range   Magnesium 2.0 1.7 - 2.4 mg/dL  Comprehensive metabolic panel     Status: Abnormal   Collection Time: 08/30/21  6:12 AM  Result Value Ref Range   Sodium 144 135 - 145 mmol/L   Potassium 3.6 3.5 - 5.1 mmol/L   Chloride 112 (H) 98 - 111 mmol/L   CO2 27 22 - 32 mmol/L   Glucose, Bld 90 70 - 99 mg/dL   BUN <5 (L) 8 - 23 mg/dL   Creatinine, Ser 0.69 0.44 - 1.00 mg/dL   Calcium 8.6 (L) 8.9 - 10.3 mg/dL   Total Protein 5.5 (L) 6.5 - 8.1 g/dL   Albumin 2.4 (L)  3.5 - 5.0 g/dL   AST 12 (L) 15 - 41 U/L   ALT 12 0 - 44 U/L   Alkaline Phosphatase 58 38 - 126 U/L   Total Bilirubin 0.4 0.3 - 1.2 mg/dL   GFR, Estimated >60 >60 mL/min   Anion gap 5 5 -  15     I have Reviewed nursing notes, Vitals, and Lab results since pt's last encounter. Pertinent lab results : see above I have ordered test including BMP, CBC, Mg I have reviewed the last note from staff over past 24 hours I have discussed pt's care plan and test results with nursing staff, case manager   LOS: 7 days   Dwyane Dee, MD [facility] 08/30/2021, 2:07 PM

## 2021-08-31 ENCOUNTER — Other Ambulatory Visit: Payer: Self-pay

## 2021-08-31 DIAGNOSIS — K8689 Other specified diseases of pancreas: Secondary | ICD-10-CM

## 2021-08-31 DIAGNOSIS — K863 Pseudocyst of pancreas: Secondary | ICD-10-CM | POA: Diagnosis not present

## 2021-08-31 DIAGNOSIS — C259 Malignant neoplasm of pancreas, unspecified: Secondary | ICD-10-CM

## 2021-08-31 LAB — COMPREHENSIVE METABOLIC PANEL
ALT: 13 U/L (ref 0–44)
AST: 13 U/L — ABNORMAL LOW (ref 15–41)
Albumin: 2.5 g/dL — ABNORMAL LOW (ref 3.5–5.0)
Alkaline Phosphatase: 57 U/L (ref 38–126)
Anion gap: 7 (ref 5–15)
BUN: 6 mg/dL — ABNORMAL LOW (ref 8–23)
CO2: 24 mmol/L (ref 22–32)
Calcium: 8.2 mg/dL — ABNORMAL LOW (ref 8.9–10.3)
Chloride: 110 mmol/L (ref 98–111)
Creatinine, Ser: 0.7 mg/dL (ref 0.44–1.00)
GFR, Estimated: >60 mL/min (ref >60)
Glucose, Bld: 86 mg/dL (ref 70–99)
Potassium: 3.4 mmol/L — ABNORMAL LOW (ref 3.5–5.1)
Sodium: 141 mmol/L (ref 135–145)
Total Bilirubin: 0.5 mg/dL (ref 0.3–1.2)
Total Protein: 5.6 g/dL — ABNORMAL LOW (ref 6.5–8.1)

## 2021-08-31 LAB — CBC WITH DIFFERENTIAL/PLATELET
Abs Immature Granulocytes: 0.11 10*3/uL — ABNORMAL HIGH (ref 0.00–0.07)
Basophils Absolute: 0 10*3/uL (ref 0.0–0.1)
Basophils Relative: 1 %
Eosinophils Absolute: 0.2 10*3/uL (ref 0.0–0.5)
Eosinophils Relative: 3 %
HCT: 32.8 % — ABNORMAL LOW (ref 36.0–46.0)
Hemoglobin: 10.3 g/dL — ABNORMAL LOW (ref 12.0–15.0)
Immature Granulocytes: 2 %
Lymphocytes Relative: 14 %
Lymphs Abs: 0.9 10*3/uL (ref 0.7–4.0)
MCH: 30.5 pg (ref 26.0–34.0)
MCHC: 31.4 g/dL (ref 30.0–36.0)
MCV: 97 fL (ref 80.0–100.0)
Monocytes Absolute: 0.6 10*3/uL (ref 0.1–1.0)
Monocytes Relative: 9 %
Neutro Abs: 4.6 10*3/uL (ref 1.7–7.7)
Neutrophils Relative %: 71 %
Platelets: 300 10*3/uL (ref 150–400)
RBC: 3.38 MIL/uL — ABNORMAL LOW (ref 3.87–5.11)
RDW: 14.3 % (ref 11.5–15.5)
WBC: 6.5 10*3/uL (ref 4.0–10.5)
nRBC: 0 % (ref 0.0–0.2)

## 2021-08-31 LAB — MAGNESIUM: Magnesium: 1.9 mg/dL (ref 1.7–2.4)

## 2021-08-31 MED ORDER — ALUM & MAG HYDROXIDE-SIMETH 200-200-20 MG/5ML PO SUSP
30.0000 mL | ORAL | 0 refills | Status: DC | PRN
Start: 2021-08-31 — End: 2022-04-06

## 2021-08-31 MED ORDER — CALCIUM CARBONATE ANTACID 500 MG PO CHEW: 1.0000 | CHEWABLE_TABLET | Freq: Two times a day (BID) | ORAL | Status: DC | PRN

## 2021-08-31 MED ORDER — MAGNESIUM SULFATE 2 GM/50ML IV SOLN
2.0000 g | Freq: Once | INTRAVENOUS | Status: AC
  Administered 2021-08-31: 2 g via INTRAVENOUS
  Filled 2021-08-31: qty 50

## 2021-08-31 MED ORDER — POTASSIUM CHLORIDE 20 MEQ PO PACK
40.0000 meq | PACK | Freq: Once | ORAL | Status: AC
  Administered 2021-08-31: 40 meq via ORAL
  Filled 2021-08-31: qty 2

## 2021-08-31 NOTE — Telephone Encounter (Signed)
Pt needs CT order sent to the schedulers  EGD stent pull 45  min case 10/08/21 at 945 am at [facility] with GM

## 2021-08-31 NOTE — Progress Notes (Signed)
Mobility Specialist - Progress Note   08/31/21 1129  Mobility  Activity Ambulated with assistance in hallway  Level of Assistance Contact guard assist, steadying assist  Assistive Device None  Distance Ambulated (ft) 250 ft  Activity Response Tolerated well  $Mobility charge 1 Mobility   Pt received in recliner and agreeable to mobility. Pt scissor stepped during ambulation and claimed she had a walker at home to use for steadying assist. Pt to recliner after session with all needs met and call bell and phone in reach.   Roderick Pee Mobility Specialist

## 2021-08-31 NOTE — Discharge Instructions (Signed)
Okay to take maalox or tums if you feel indigestion symptoms. If still present despite this, you may need to call for a prescription for protonix but since you tolerated not requiring any medication at the end of the hospitalization, we hope this continues as well.   Please call gastroenterology for a follow up appointment  Go to your oncology appointment as already planned.

## 2021-08-31 NOTE — Care Management Important Message (Signed)
Important Message  Patient Details IM Letter placed in Patients room. Name: Sheryl Bender MRN: [id number] Date of Birth: November 28, 1945   Medicare Important Message Given:  Yes     Kerin Salen 08/31/2021, 9:47 AM

## 2021-08-31 NOTE — Discharge Summary (Signed)
Physician Discharge Summary   Sheryl Bender VOH:[id number] DOB: Sep 04, 1945 DOA: 08/23/2021  PCP: Sheryl Gilles, DO  Admit date: 08/23/2021 Discharge date:  08/31/2021 Barriers to discharge: none  Admitted From: Home Disposition:  Home Discharging physician: Sheryl Dee, MD  Recommendations for Outpatient Follow-up:  Follow-up with GI  Home Health:  Equipment/Devices:   Discharge Condition: stable CODE STATUS: Full Diet recommendation:  Diet Orders (From admission, onward)     Start     Ordered   08/30/21 0914  DIET SOFT Room service appropriate? Yes; Fluid consistency: Thin  Diet effective now       Question Answer Comment  Room service appropriate? 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  Last abdominal imaging was MRCP on 08/03/2021 which showed enlargement of the pseudocyst when compared to June imaging. She initially presented to [facility] and underwent CT abdomen/pelvis which showed enlargement of pseudocyst with mass effect.  Films were reviewed by GI after patient was transferred to [facility]. She underwent EUS with cystgastrostomy creation and AXIOS stent placement with GI on 08/27/21.   Assessment and Plan:  Abdominal pain Pancreatic pseudocyst - GI following, appreciate assistance - Sovah images reviewed by GI - s/p EUS with cystgastrostomy creation and AXIOS stent placement with GI on 08/27/21 - Continue fluids, nausea, pain control - no VTE ppx for 48 hrs post procedure -Slow diet advancement per GI - Repeat CT A/P in approximately 3 weeks with stent pulling 1-2 weeks later if cyst has decompressed per GI - continue cipro x 3 days; cultures reviewed. Course completed on 08/30/21 -Continues to tolerate being off of PPI. She did not require tums or maalox either    Pancreatic CA T1N0, further treatment pending as an outpatient   Anxiety and depression - home meds reviewed with patient as she is on a large amount of psychotropics - resumed home regimen in efforts to prevent withdrawal  -Reassured patient she had no signs of serotonin syndrome -Home regimen continued at discharge   H/oh PE September 2022 - SCD for now - no longer on anticoagulation    The patient's chronic medical conditions were treated accordingly per the patient's home medication regimen except as noted.  On day of discharge, patient was felt deemed stable for discharge. Patient/family member advised to call PCP or come back to ER if needed.   Principal Diagnosis: Pancreatic pseudocyst  Discharge Diagnoses: Active Hospital Problems   Diagnosis Date Noted   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   GERD (gastroesophageal reflux disease) 07/13/2021    Priority: 3.   Depression 07/13/2021    Priority: 6.   Malnutrition of moderate degree 08/25/2021   Epigastric pain 08/24/2021   Nausea & vomiting 08/24/2021    Resolved Hospital Problems  No resolved problems to display.     Discharge Instructions     Increase activity slowly   Complete by: As directed       Allergies as of 08/31/2021       Reactions   Other Hives, Other (See Comments)   Cherry wood just cut- "smelled it and broke out"; cannot tolerate ANY cherry fragrances, either   Cherry Hives   Wound Dressing Adhesive Rash, Other (See Comments)   Band-Aids = local reaction        Medication List  STOP taking these medications    esomeprazole 40 MG capsule Commonly known as: NexIUM   NexIUM 24HR 20 MG capsule Generic drug: esomeprazole       TAKE these medications    acetaminophen 500 MG tablet Commonly known as: TYLENOL Take 2 tablets (1,000 mg total) by mouth every 8 (eight) hours as needed for moderate pain, headache or fever.   alum & mag hydroxide-simeth 200-200-20 MG/5ML  suspension Commonly known as: MAALOX/MYLANTA Take 30 mLs by mouth every 4 (four) hours as needed for indigestion.   buPROPion 300 MG 24 hr tablet Commonly known as: WELLBUTRIN XL Take 300 mg by mouth in the morning.   busPIRone 10 MG tablet Commonly known as: BUSPAR Take 20 mg by mouth in the morning and at bedtime.   calcium carbonate 500 MG chewable tablet Commonly known as: TUMS - dosed in mg elemental calcium Chew 1 tablet (200 mg of elemental calcium total) by mouth 2 (two) times daily as needed for indigestion or heartburn.   ciclopirox 0.77 % cream Commonly known as: LOPROX Apply 1 application  topically See admin instructions. Apply to the fingernails once a day   cyanocobalamin 500 MCG tablet Commonly known as: VITAMIN B12 Take 1 tablet (500 mcg total) by mouth daily.   escitalopram 20 MG tablet Commonly known as: LEXAPRO Take 10-20 mg by mouth See admin instructions. Take 10 mg by mouth in the morning and 20 mg at 8:30 PM   lactose free nutrition Liqd Take 237 mLs by mouth 2 (two) times daily between meals. What changed: Another medication with the same name was removed. Continue taking this medication, and follow the directions you see here.   linaclotide 72 MCG capsule Commonly known as: LINZESS Take 1 capsule (72 mcg total) by mouth daily before breakfast.   metoprolol succinate 25 MG 24 hr tablet Commonly known as: TOPROL-XL Take 25 mg by mouth daily.   mirtazapine 30 MG tablet Commonly known as: REMERON Take 30 mg by mouth at bedtime.   ondansetron 8 MG disintegrating tablet Commonly known as: ZOFRAN-ODT Take 1 tablet (8 mg total) by mouth every 8 (eight) hours as needed for nausea or vomiting. What changed: reasons to take this   oxyCODONE 5 MG immediate release tablet Commonly known as: Oxy IR/ROXICODONE Take 1 tablet (5 mg total) by mouth every 6 (six) hours as needed for severe pain.   polyethylene glycol 17 g packet Commonly known as: MIRALAX  / GLYCOLAX Take 17 g by mouth daily. What changed: how much to take   QUEtiapine 25 MG tablet Commonly known as: SEROQUEL Take 25 mg by mouth 3 (three) times daily after meals.   QUEtiapine 200 MG tablet Commonly known as: SEROQUEL Take 200 mg by mouth at bedtime.   Vitamin D3 125 MCG (5000 UT) Caps Take 5,000 Units by mouth every other day.        Follow-up Information     Mansouraty, Telford Nab., MD. Schedule an appointment as soon as possible for a visit in 2 week(s).   Specialties: Gastroenterology, Internal Medicine Why: Call for appointment Contact information: [place] [address] [phone]         Sheryl Gilles, DO. Schedule an appointment as soon as possible for a visit in 2 week(s).   Specialty: Family Medicine Contact information: [address] [place] [address] [phone]                Allergies  Allergen Reactions  Other Hives and Other (See Comments)    Cherry wood just cut- "smelled it and broke out"; cannot tolerate ANY cherry fragrances, either      Cherry Hives   Wound Dressing Adhesive Rash and Other (See Comments)    Band-Aids = local reaction    Consultations: GI  Procedures: EUS with cystgastrostomy creation and AXIOS stent placement with GI on 08/27/21  Discharge Exam: BP 134/79 (BP Location: Right Arm)   Pulse 73   Temp 98.9 F (37.2 C) (Oral)   Resp 16   Ht '5\' 2"'$  (1.575 m)   Wt 58.7 kg   SpO2 91%   BMI 23.67 kg/m  Physical Exam Constitutional:      Appearance: Normal appearance.  HENT:     Head: Normocephalic and atraumatic.     Mouth/Throat:     Mouth: Mucous membranes are moist.  Eyes:     Extraocular Movements: Extraocular movements intact.  Cardiovascular:     Rate and Rhythm: Normal rate and regular rhythm.  Pulmonary:     Effort: Pulmonary effort is normal.     Breath sounds: Normal breath sounds.  Abdominal:     General: Bowel sounds are normal. There is no distension.      Palpations: Abdomen is soft.     Comments: Minimal TTP  Musculoskeletal:        General: Normal range of motion.     Cervical back: Normal range of motion and neck supple.  Skin:    General: Skin is warm and dry.  Neurological:     General: No focal deficit present.     Mental Status: She is alert.  Psychiatric:        Mood and Affect: Mood normal.      The results of significant diagnostics from this hospitalization (including imaging, microbiology, ancillary and laboratory) are listed below for reference.   Microbiology: Recent Results (from the past 240 hour(s))  Aerobic/Anaerobic Culture w Gram Stain (surgical/deep wound)     Status: Abnormal (Preliminary result)   Collection Time: 08/27/21  3:40 PM   Specimen: PATH GI biopsy; Body Fluid  Result Value Ref Range Status   Specimen Description   Final    FLUID Performed at [facility] [address] [place], [place] [address]    Special Requests   Final    NONE PANCREATIC PSEUDOCYST ASPIRATE Performed at [facility] [address] [place], [place] [address]    Gram Stain   Final    NO WBC SEEN RARE GRAM POSITIVE RODS FEW GRAM POSITIVE COCCI IN PAIRS Performed at [facility], [place] [address], [place], [place] [address]    Culture (A)  Final    MULTIPLE ORGANISMS PRESENT, NONE PREDOMINANT NO ANAEROBES ISOLATED; CULTURE IN PROGRESS FOR 5 DAYS    Report Status PENDING  Incomplete     Labs: BNP (last 3 results) No results for input(s): "BNP" in the last 8760 hours. Basic Metabolic Panel: Recent Labs  Lab 08/27/21 0514 08/28/21 0452 08/29/21 0548 08/30/21 0612 08/31/21 0505  NA 143 137 139 144 141  K 3.9 3.7 3.4* 3.6 3.4*  CL 109 103 103 112* 110  CO2 '28 26 28 27 24  '$ GLUCOSE 80 90 102* 90 86  BUN 6* 7* 5* <5* 6*  CREATININE 0.67 0.63 0.75 0.69 0.70  CALCIUM 8.6* 8.1* 8.0* 8.6* 8.2*  MG 1.9 1.7 1.9 2.0 1.9   Liver Function Tests: Recent Labs  Lab  08/25/21  8469 08/28/21 0452 08/29/21 0548 08/30/21 0612 08/31/21 0505  AST 15 14* 12* 12* 13*  ALT '19 15 12 12 13  '$ ALKPHOS 63 54 56 58 57  BILITOT 0.6 0.6 0.4 0.4 0.5  PROT 5.9* 5.3* 5.3* 5.5* 5.6*  ALBUMIN 2.6* 2.3* 2.4* 2.4* 2.5*   No results for input(s): "LIPASE", "AMYLASE" in the last 168 hours. No results for input(s): "AMMONIA" in the last 168 hours. CBC: Recent Labs  Lab 08/27/21 0514 08/28/21 0452 08/29/21 0548 08/30/21 0612 08/31/21 0505  WBC 3.5* 6.9 9.9 9.5 6.5  NEUTROABS 2.1 5.1 8.0* 6.9 4.6  HGB 10.5* 10.2* 10.4* 10.5* 10.3*  HCT 32.5* 32.1* 32.8* 32.6* 32.8*  MCV 95.9 95.0 96.8 97.3 97.0  PLT 323 337 320 310 300   Cardiac Enzymes: No results for input(s): "CKTOTAL", "CKMB", "CKMBINDEX", "TROPONINI" in the last 168 hours. BNP: Invalid input(s): "POCBNP" CBG: No results for input(s): "GLUCAP" in the last 168 hours. D-Dimer No results for input(s): "DDIMER" in the last 72 hours. Hgb A1c No results for input(s): "HGBA1C" in the last 72 hours. Lipid Profile No results for input(s): "CHOL", "HDL", "LDLCALC", "TRIG", "CHOLHDL", "LDLDIRECT" in the last 72 hours. Thyroid function studies No results for input(s): "TSH", "T4TOTAL", "T3FREE", "THYROIDAB" in the last 72 hours.  Invalid input(s): "FREET3" Anemia work up No results for input(s): "VITAMINB12", "FOLATE", "FERRITIN", "TIBC", "IRON", "RETICCTPCT" in the last 72 hours. Urinalysis No results found for: "COLORURINE", "APPEARANCEUR", "LABSPEC", "PHURINE", "GLUCOSEU", "HGBUR", "BILIRUBINUR", "KETONESUR", "PROTEINUR", "UROBILINOGEN", "NITRITE", "LEUKOCYTESUR" Sepsis Labs Recent Labs  Lab 08/28/21 0452 08/29/21 0548 08/30/21 0612 08/31/21 0505  WBC 6.9 9.9 9.5 6.5   Microbiology Recent Results (from the past 240 hour(s))  Aerobic/Anaerobic Culture w Gram Stain (surgical/deep wound)     Status: Abnormal (Preliminary result)   Collection Time: 08/27/21  3:40 PM   Specimen: PATH GI biopsy; Body Fluid   Result Value Ref Range Status   Specimen Description   Final    FLUID Performed at [facility] [address], [place], [place] [address]    Special Requests   Final    NONE PANCREATIC PSEUDOCYST ASPIRATE Performed at [facility] [address], [place], [place] [address]    Gram Stain   Final    NO WBC SEEN RARE GRAM POSITIVE RODS FEW GRAM POSITIVE COCCI IN PAIRS Performed at [facility], [place] [address] [place],  [address]    Culture (A)  Final    MULTIPLE ORGANISMS PRESENT, NONE PREDOMINANT NO ANAEROBES ISOLATED; CULTURE IN PROGRESS FOR 5 DAYS    Report Status PENDING  Incomplete    Procedures/Studies: MR Abdomen W Wo Contrast  Result Date: 08/04/2021 CLINICAL DATA:  History of pancreatic cancer. Restaging. Upper abdominal pain. EXAM: MRI ABDOMEN WITHOUT AND WITH CONTRAST TECHNIQUE: Multiplanar multisequence MR imaging of the abdomen was performed both before and after the administration of intravenous contrast. CONTRAST:  72m GADAVIST GADOBUTROL 1 MMOL/ML IV SOLN COMPARISON:  PET of 07/30/2021. Abdominal CT of 07/13/2021. Prior MRI of 02/06/2021. FINDINGS: Lower chest: Mild cardiomegaly.  Tiny bilateral pleural effusions. Hepatobiliary: Tiny cystic lesions throughout the liver are similar to the prior MRI of 02/06/2021, favoring simple cysts. No findings to suggest hepatic metastasis. Normal gallbladder. No intrahepatic biliary duct dilatation. The common duct is normal in caliber proximally, but difficult to follow distally secondary to below findings. Pancreas: Again identified is pancreatic main and side branch duct ectasia throughout the body, tail, and neck. Surrounding parenchymal atrophy. The common  duct measures 8 mm within the head/neck junction on 20/4, similar to the prior MRI. Again followed to the level of the pancreatic head, where an abrupt transition occurs including on 20/4. There is no border deforming mass in  this area and no differential enhancement to localize a well-defined mass. The fluid collection positioned between the pancreatic head and gastroduodenal junction measures 6.0 x 6.3 by 9.8 cm on 26/4 and 11/3. This is most directly compared to the CT of 07/13/2021, where it measured 4.1 x 3.1 x 7.9 cm. There is more ill-defined edema and fluid posterior to the gastric body on 11/06 and antrum on 19/6. Spleen:  Normal in size, without focal abnormality. Adrenals/Urinary Tract: Normal adrenal glands. Bilateral renal sinus cysts. Stomach/Bowel: The gastroduodenal junction is markedly displaced by the peripancreatic presumed pseudocyst. Example image 59/18 and 11/22. Remainder of large and small bowel normal in caliber. Vascular/Lymphatic: Aortic atherosclerosis. No vascular involvement with tumor. Patent portal and splenic veins. No retroperitoneal or retrocrural adenopathy. Other:  No ascites. Musculoskeletal: Lumbar spine fixation. IMPRESSION: 1. Similar appearance of the pancreas, with abrupt transition from dilated to decompressed duct in the pancreatic head. No well-defined underlying mass identified. 2. Enlargement since 07/13/2021 of a presumed pseudocyst positioned between the pancreatic head and gastroduodenal junction. This causes significant mass-effect upon the pyloric region and gastric outlet obstruction should be clinically excluded. 3. More ill-defined retro gastric edema/fluid suggests ongoing pancreatitis. 4. No evidence of abdominal metastatic disease. 5. Tiny bilateral pleural effusions. Electronically Signed   By: Abigail Miyamoto M.D.   On: 08/04/2021 16:34   MR 3D Recon At Scanner  Result Date: 08/04/2021 CLINICAL DATA:  History of pancreatic cancer. Restaging. Upper abdominal pain. EXAM: MRI ABDOMEN WITHOUT AND WITH CONTRAST TECHNIQUE: Multiplanar multisequence MR imaging of the abdomen was performed both before and after the administration of intravenous contrast. CONTRAST:  8m GADAVIST  GADOBUTROL 1 MMOL/ML IV SOLN COMPARISON:  PET of 07/30/2021. Abdominal CT of 07/13/2021. Prior MRI of 02/06/2021. FINDINGS: Lower chest: Mild cardiomegaly.  Tiny bilateral pleural effusions. Hepatobiliary: Tiny cystic lesions throughout the liver are similar to the prior MRI of 02/06/2021, favoring simple cysts. No findings to suggest hepatic metastasis. Normal gallbladder. No intrahepatic biliary duct dilatation. The common duct is normal in caliber proximally, but difficult to follow distally secondary to below findings. Pancreas: Again identified is pancreatic main and side branch duct ectasia throughout the body, tail, and neck. Surrounding parenchymal atrophy. The common duct measures 8 mm within the head/neck junction on 20/4, similar to the prior MRI. Again followed to the level of the pancreatic head, where an abrupt transition occurs including on 20/4. There is no border deforming mass in this area and no differential enhancement to localize a well-defined mass. The fluid collection positioned between the pancreatic head and gastroduodenal junction measures 6.0 x 6.3 by 9.8 cm on 26/4 and 11/3. This is most directly compared to the CT of 07/13/2021, where it measured 4.1 x 3.1 x 7.9 cm. There is more ill-defined edema and fluid posterior to the gastric body on 11/06 and antrum on 19/6. Spleen:  Normal in size, without focal abnormality. Adrenals/Urinary Tract: Normal adrenal glands. Bilateral renal sinus cysts. Stomach/Bowel: The gastroduodenal junction is markedly displaced by the peripancreatic presumed pseudocyst. Example image 59/18 and 11/22. Remainder of large and small bowel normal in caliber. Vascular/Lymphatic: Aortic atherosclerosis. No vascular involvement with tumor. Patent portal and splenic veins. No retroperitoneal or retrocrural adenopathy. Other:  No ascites. Musculoskeletal: Lumbar spine fixation. IMPRESSION:  1. Similar appearance of the pancreas, with abrupt transition from dilated to  decompressed duct in the pancreatic head. No well-defined underlying mass identified. 2. Enlargement since 07/13/2021 of a presumed pseudocyst positioned between the pancreatic head and gastroduodenal junction. This causes significant mass-effect upon the pyloric region and gastric outlet obstruction should be clinically excluded. 3. More ill-defined retro gastric edema/fluid suggests ongoing pancreatitis. 4. No evidence of abdominal metastatic disease. 5. Tiny bilateral pleural effusions. Electronically Signed   By: Abigail Miyamoto M.D.   On: 08/04/2021 16:34     Time coordinating discharge: Over 30 minutes    Sheryl Dee, MD  [facility] 08/31/2021, 1:24 PM

## 2021-09-01 LAB — AEROBIC/ANAEROBIC CULTURE W GRAM STAIN (SURGICAL/DEEP WOUND): Gram Stain: NONE SEEN

## 2021-09-01 NOTE — Telephone Encounter (Signed)
EGD scheduled, pt instructed and medications reviewed.  Patient instructions mailed to home.  Patient to call with any questions or concerns.  The pt is aware to expect a call from the schedulers to set up the CT.  She will call back if she has not heard from them in 1 week.

## 2021-09-08 ENCOUNTER — Inpatient Hospital Stay: Payer: Medicare Other | Attending: Hematology | Admitting: Hematology

## 2021-09-08 ENCOUNTER — Inpatient Hospital Stay: Payer: Medicare Other

## 2021-09-08 VITALS — BP 102/62 | HR 70 | Temp 97.7°F | Resp 16 | Ht 62.5 in | Wt 118.0 lb

## 2021-09-08 DIAGNOSIS — C259 Malignant neoplasm of pancreas, unspecified: Secondary | ICD-10-CM

## 2021-09-08 DIAGNOSIS — Z5111 Encounter for antineoplastic chemotherapy: Secondary | ICD-10-CM | POA: Insufficient documentation

## 2021-09-08 DIAGNOSIS — Z86711 Personal history of pulmonary embolism: Secondary | ICD-10-CM | POA: Insufficient documentation

## 2021-09-08 DIAGNOSIS — C253 Malignant neoplasm of pancreatic duct: Secondary | ICD-10-CM | POA: Diagnosis present

## 2021-09-08 DIAGNOSIS — Z95828 Presence of other vascular implants and grafts: Secondary | ICD-10-CM

## 2021-09-08 DIAGNOSIS — C250 Malignant neoplasm of head of pancreas: Secondary | ICD-10-CM

## 2021-09-08 LAB — GENETIC SCREENING ORDER

## 2021-09-08 NOTE — Progress Notes (Signed)
Sheryl Bender, [place] [address]   CLINIC:  Medical Oncology/Hematology  PCP:  Sherrilee Gilles, DO [address] [place] [address] [phone]   REASON FOR VISIT:  Follow-up for pancreatic cancer  PRIOR THERAPY: none  NGS Results: not done  CURRENT THERAPY: under work-up  BRIEF ONCOLOGIC HISTORY:  Oncology History  Malignant neoplasm of pancreas ([facility])  07/21/2021 Initial Diagnosis   Malignant neoplasm of pancreas ([facility])   09/16/2021 -  Chemotherapy   Patient is on Treatment Plan : PANCREAS Modified FOLFIRINOX q14d x 4 cycles       CANCER STAGING:  Cancer Staging  Malignant neoplasm of pancreas [facility]) Staging form: Exocrine Pancreas, AJCC 8th Edition - Clinical stage from 07/29/2021: Stage IA (cT1, cN0, cM0) - Unsigned   INTERVAL HISTORY:  Sheryl Bender, a 76 y.o. female, seen for follow-up of pancreatic cancer.  She was recently hospitalized and discharged on 08/31/2021 after cyst gastrostomy creation and axial stent placement with GI on 08/27/2021.  She is eating better since discharge.  REVIEW OF SYSTEMS:  Review of Systems  Constitutional:  Negative for appetite change and fatigue.  Respiratory:  Negative for shortness of breath.   Gastrointestinal:  Positive for abdominal pain. Negative for nausea.  Neurological:  Negative for dizziness.  All other systems reviewed and are negative.   PAST MEDICAL/SURGICAL HISTORY:  Past Medical History:  Diagnosis Date   Anxiety    Arthritis    Depression    Dysrhythmia    hx palpitations greater than 5 yrs -neg echo, stress per pt ? where or dr   GERD (gastroesophageal reflux disease)    occ   Pancreatic adenocarcinoma ([place])    Pancreatic pseudocyst    Pulmonary embolism [facility])    September 2022   Past Surgical History:  Procedure Laterality Date   ANTERIOR LAT LUMBAR FUSION Right 12/16/2015   Procedure: RIGHT LUMBAR TWO-THREE, LUMBAR THREE-FOUR, LUMBAR FOUR-FIVE ANTEROLATERAL  LUMBAR INTERBODY FUSION;  Surgeon: Erline Levine, MD;  Location: [place];  Service: Neurosurgery;  Laterality: Right;   BALLOON DILATION N/A 08/27/2021   Procedure: BALLOON DILATION;  Surgeon: Rush Landmark Telford Nab., MD;  Location: [facility];  Service: Gastroenterology;  Laterality: N/A;   BIOPSY  07/09/2021   Procedure: BIOPSY;  Surgeon: Rush Landmark Telford Nab., MD;  Location: [facility];  Service: Gastroenterology;;   CYST GASTROSTOMY  08/27/2021   Procedure: CYST GASTROSTOMY;  Surgeon: Irving Copas., MD;  Location: [facility];  Service: Gastroenterology;;   ESOPHAGOGASTRODUODENOSCOPY N/A 07/09/2021   Procedure: ESOPHAGOGASTRODUODENOSCOPY (EGD);  Surgeon: Irving Copas., MD;  Location: [facility];  Service: Gastroenterology;  Laterality: N/A;   ESOPHAGOGASTRODUODENOSCOPY (EGD) WITH PROPOFOL N/A 08/27/2021   Procedure: ESOPHAGOGASTRODUODENOSCOPY (EGD) WITH PROPOFOL;  Surgeon: Rush Landmark Telford Nab., MD;  Location: [facility];  Service: Gastroenterology;  Laterality: N/A;   EUS N/A 07/09/2021   Procedure: UPPER ENDOSCOPIC ULTRASOUND (EUS) RADIAL;  Surgeon: Irving Copas., MD;  Location: [facility];  Service: Gastroenterology;  Laterality: N/A;   EUS N/A 08/27/2021   Procedure: UPPER ENDOSCOPIC ULTRASOUND (EUS) LINEAR;  Surgeon: Irving Copas., MD;  Location: [facility];  Service: Gastroenterology;  Laterality: N/A;   FINE NEEDLE ASPIRATION  07/09/2021   Procedure: FINE NEEDLE ASPIRATION (FNA) LINEAR;  Surgeon: Irving Copas., MD;  Location: [facility];  Service: Gastroenterology;;   LUMBAR PERCUTANEOUS PEDICLE SCREW 3 LEVEL Bilateral 12/16/2015   Procedure: PERCUTANEOUS PEDICLE SCREWS BILATERALLY AT LUMBAR TWO-FIVE;  Surgeon: Erline Levine, MD;  Location: [facility]  OR;  Service: Neurosurgery;  Laterality: Bilateral;   PANCREATIC STENT PLACEMENT  08/27/2021   Procedure: PANCREATIC STENT PLACEMENT;  Surgeon: Mansouraty, Gabriel Jr., MD;  Location: [facility];  Service: Gastroenterology;;   TUBAL LIGATION  1974    SOCIAL HISTORY:  Social History   Socioeconomic History   Marital status: Widowed    Spouse name: Not on file   Number of children: Not on file   Years of education: Not on file   Highest education level: Not on file  Occupational History   Not on file  Tobacco Use   Smoking status: Never   Smokeless tobacco: Never  Substance and Sexual Activity   Alcohol use: No   Drug use: Never   Sexual activity: Not on file  Other Topics Concern   Not on file  Social History Narrative   Not on file   Social Determinants of Health   Financial Resource Strain: Not on file  Food Insecurity: Not on file  Transportation Needs: Not on file  Physical Activity: Not on file  Stress: Not on file  Social Connections: Not on file  Intimate Partner Violence: Not on file    FAMILY HISTORY:  Family History  Problem Relation Age of Onset   Pancreatitis Brother        alcoholic pancreatitis   Pancreatic cancer Neg Hx    Colon cancer Neg Hx     CURRENT MEDICATIONS:  Current Outpatient Medications  Medication Sig Dispense Refill   acetaminophen (TYLENOL) 500 MG tablet Take 2 tablets (1,000 mg total) by mouth every 8 (eight) hours as needed for moderate pain, headache or fever.     alum & mag hydroxide-simeth (MAALOX/MYLANTA) 200-200-20 MG/5ML suspension Take 30 mLs by mouth every 4 (four) hours as needed for indigestion. 355 mL 0   buPROPion (WELLBUTRIN XL) 300 MG 24 hr tablet Take 300 mg by mouth in the morning.     busPIRone (BUSPAR) 10 MG tablet Take 20 mg by mouth in the morning and at bedtime.     calcium carbonate (TUMS - DOSED IN MG ELEMENTAL CALCIUM) 500 MG chewable tablet Chew 1 tablet (200 mg of elemental calcium total) by mouth 2 (two) times daily as needed for indigestion or heartburn.     Cholecalciferol (VITAMIN D3) 125 MCG (5000 UT) CAPS Take 5,000 Units by mouth every other day.     ciclopirox (LOPROX) 0.77 %  cream Apply 1 application  topically See admin instructions. Apply to the fingernails once a day     escitalopram (LEXAPRO) 20 MG tablet Take 10-20 mg by mouth See admin instructions. Take 10 mg by mouth in the morning and 20 mg at 8:30 PM     lactose free nutrition (BOOST) LIQD Take 237 mLs by mouth 2 (two) times daily between meals.     linaclotide (LINZESS) 72 MCG capsule Take 1 capsule (72 mcg total) by mouth daily before breakfast. 30 capsule 2   metoprolol succinate (TOPROL-XL) 25 MG 24 hr tablet Take 25 mg by mouth daily.     mirtazapine (REMERON) 30 MG tablet Take 30 mg by mouth at bedtime.     oxyCODONE (OXY IR/ROXICODONE) 5 MG immediate release tablet Take 1 tablet (5 mg total) by mouth every 6 (six) hours as needed for severe pain. 30 tablet 0   polyethylene glycol (MIRALAX / GLYCOLAX) 17 g packet Take 17 g by mouth daily. (Patient taking differently: Take 5 g by mouth daily.) 28 each 1   QUEtiapine (  SEROQUEL) 200 MG tablet Take 200 mg by mouth at bedtime.     QUEtiapine (SEROQUEL) 25 MG tablet Take 25 mg by mouth 3 (three) times daily after meals.     vitamin B-12 (CYANOCOBALAMIN) 500 MCG tablet Take 1 tablet (500 mcg total) by mouth daily. 30 tablet 2   ondansetron (ZOFRAN-ODT) 8 MG disintegrating tablet Take 1 tablet (8 mg total) by mouth every 8 (eight) hours as needed for nausea or vomiting. (Patient not taking: Reported on 09/08/2021) 30 tablet 0   No current facility-administered medications for this visit.    ALLERGIES:  Allergies  Allergen Reactions   Other Hives and Other (See Comments)    Cherry wood just cut- "smelled it and broke out"; cannot tolerate ANY cherry fragrances, either      Cherry Hives   Wound Dressing Adhesive Rash and Other (See Comments)    Band-Aids = local reaction    PHYSICAL EXAM:  Performance status (ECOG): 1 - Symptomatic but completely ambulatory  Vitals:   09/08/21 1304  BP: 102/62  Pulse: 70  Resp: 16  Temp: 97.7 F (36.5 C)   SpO2: 99%   Wt Readings from Last 3 Encounters:  09/08/21 118 lb (53.5 kg)  08/31/21 129 lb 6.6 oz (58.7 kg)  08/05/21 120 lb 4.8 oz (54.6 kg)   Physical Exam Vitals reviewed.  Constitutional:      Appearance: Normal appearance.  Cardiovascular:     Rate and Rhythm: Normal rate and regular rhythm.     Pulses: Normal pulses.     Heart sounds: Normal heart sounds.  Pulmonary:     Effort: Pulmonary effort is normal.     Breath sounds: Normal breath sounds.  Neurological:     General: No focal deficit present.     Mental Status: She is alert and oriented to person, place, and time.  Psychiatric:        Mood and Affect: Mood normal.        Behavior: Behavior normal.      LABORATORY DATA:  I have reviewed the labs as listed.     Latest Ref Rng & Units 08/31/2021    5:05 AM 08/30/2021    6:12 AM 08/29/2021    5:48 AM  CBC  WBC 4.0 - 10.5 K/uL 6.5  9.5  9.9   Hemoglobin 12.0 - 15.0 g/dL 10.3  10.5  10.4   Hematocrit 36.0 - 46.0 % 32.8  32.6  32.8   Platelets 150 - 400 K/uL 300  310  320       Latest Ref Rng & Units 08/31/2021    5:05 AM 08/30/2021    6:12 AM 08/29/2021    5:48 AM  CMP  Glucose 70 - 99 mg/dL 86  90  102   BUN 8 - 23 mg/dL 6  <5  5   Creatinine 0.44 - 1.00 mg/dL 0.70  0.69  0.75   Sodium 135 - 145 mmol/L 141  144  139   Potassium 3.5 - 5.1 mmol/L 3.4  3.6  3.4   Chloride 98 - 111 mmol/L 110  112  103   CO2 22 - 32 mmol/L 24  27  28   Calcium 8.9 - 10.3 mg/dL 8.2  8.6  8.0   Total Protein 6.5 - 8.1 g/dL 5.6  5.5  5.3   Total Bilirubin 0.3 - 1.2 mg/dL 0.5  0.4  0.4   Alkaline Phos 38 - 126 U/L 57  58  56     AST 15 - 41 U/L 13  12  12   ALT 0 - 44 U/L 13  12  12     DIAGNOSTIC IMAGING:  I have independently reviewed the scans and discussed with the patient.    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 - EGD/EUS (07/09/2021) by  Dr. Mansouraty: The region where the PD is strictured, darker appearance was identified.  There was no clear hypoechoic mass.  Endosonographic appearance suggestive of either chronic pancreatitis changes versus possible malignancy.  Endosonographic staging T1 N0 MX.  Pancreatic parenchymal abnormalities consisting of atrophy and hyperechoic strands.  No sign of significant pathology in the CBD.  No malignant appearing lymph nodes in the celiac region, peripancreatic region and porta hepatic region - Pathology (pancreatic duct FNA): Malignant cells consistent with adenocarcinoma - CA 19-9: 31 (0-35) - CTAP (07/13/2021): Fluid collection extending posterior to the gastric antrum and ventral to the pancreas.  Elongated collection extends to the greater curvature of the stomach with inflammation surrounding the collection suggestive of a pancreatic pseudocyst.  Chronic dilatation of pancreatic duct with abrupt termination of the duct dilatation in the head of the pancreas. - CT chest (07/16/2021): No evidence of metastatic disease in the chest. - 8 to 9 pound weight loss in the last 3 months, decreased appetite.       - PET scan (07/30/2021): Faint focus of increased metabolic activity, SUV 3.6, measuring 9 mm at the junction of the pancreatic head and body, suspicious for pancreatic adenocarcinoma.  No obvious extrapancreatic spread of tumor.  Cystic lesion along the posterior margin of the stomach and anterior margin of the pancreatic head favored pseudocyst.  Small amount of pelvic ascites. - MRI of the abdomen pancreatic protocol (08/03/2021): Similar appearance of the pancreas, with abrupt transition from dilated to decompressed duct in the pancreatic head with no evidence of underlying mass.  Pseudocyst enlarged position between pancreatic head and GE junction with significant mass effect upon the pyloric region and gastric outlet obstruction radiologically.  No evidence of abdominal metastatic disease.     Social/family history: - She is accompanied by her son Chris today.  She lives at home by herself.  She is independent of ADLs and IADLs.  She did office work prior to retirement.  Non-smoker and nonalcoholic. - Brother (alcoholic) and maternal half uncle had pancreatitis.  No history of malignancy.  3. Pulmonary embolism: - She was diagnosed with pulmonary embolism in January 2023 and then will hospital.  There does not appear to be any provoking factors for PE.  Reportedly Doppler was negative for DVT.  She was treated with Eliquis for 60 days, discontinued after that.   PLAN:  Stage I (T1 N0 MX) pancreatic adenocarcinoma: - She was evaluated by Dr. Allen.  She had cystogastrostomy and stent placement during recent hospitalization.       - Because of pseudocyst formation and pancreatitis, she was recommended to have neoadjuvant chemotherapy followed by surgery.       - We discussed neoadjuvant chemotherapy options including FOLFIRINOX and gemcitabine/Abraxane.       - Her performance status is improving gradually.  I will start out with FOLFOX and and Irinotecan with cycle 2 if she tolerates it well.  We will start FOLFOX at 80% dose.  We discussed side effects and the regimen in detail.  We will request a port placement prior   to start of therapy. - She was instructed to proceed with genetic testing for BRCA/PALB2 mutation testing. - Tentatively will start her on 09/16/2021. - Total duration of therapy will be 3 months and rescanning. - She is having a CT scan done for stent removal.    Orders placed this encounter:  Orders Placed This Encounter  Procedures   IR IMAGING GUIDED PORT INSERTION   Magnesium   CBC with Differential   Comprehensive metabolic panel   Magnesium   CBC with Differential   Comprehensive metabolic panel   Magnesium   CBC with Differential   Comprehensive metabolic panel   Magnesium   CBC with Differential   Comprehensive metabolic panel   Magnesium   CBC  with Differential   Comprehensive metabolic panel   Magnesium   CBC with Differential   Comprehensive metabolic panel       , MD [facility] [phone]      

## 2021-09-08 NOTE — Progress Notes (Signed)
START ON PATHWAY REGIMEN - Pancreatic Adenocarcinoma     A cycle is every 14 days:     Irinotecan      Oxaliplatin      Leucovorin      Fluorouracil   **Always confirm dose/schedule in your pharmacy ordering system**  Patient Characteristics: Preoperative, M0 (Clinical Staging), Resectable, Neoadjuvant Therapy Planned, BRCA1/2 and PALB2 Mutation Absent/Unknown Therapeutic Status: Preoperative, M0 (Clinical Staging) AJCC T Category: cT1b AJCC N Category: cN0 Resectability Status: Resectable AJCC M Category: cM0 AJCC 8 Stage Grouping: IA BRCA1/2 Mutation Status: Awaiting Test Results PALB2 Mutation Status: Awaiting Test Results Intent of Therapy: Curative Intent, Discussed with Patient

## 2021-09-08 NOTE — Patient Instructions (Addendum)
[place] at [facility] Discharge Instructions   You were seen and examined today by Dr. Delton Coombes.  He discussed with you the plan recommended by Dr. Zenia Resides to start chemotherapy first and then do the surgery. She is unable to do surgery right now due to the inflammation.   He discussed with you a chemotherapy regimen with you called FOLFIRINOX. The medications in this regimen are oxaliplatin, leucovorin (not chemo), irinotecan, and fluorouracil (5 FU). This regimen is given every 2 weeks. You will do this every 2 weeks for a total of three months.   We will arrange for you to have a port a cath placed for chemotherapy administration.    Thank you for choosing [place] at [facility] to provide your oncology and hematology care.  To afford each patient quality time with our provider, please arrive at least 15 minutes before your scheduled appointment time.   If you have a lab appointment with the [place] please come in thru the Main Entrance and check in at the main information desk.  You need to re-schedule your appointment should you arrive 10 or more minutes late.  We strive to give you quality time with our providers, and arriving late affects you and other patients whose appointments are after yours.  Also, if you no show three or more times for appointments you may be dismissed from the clinic at the providers discretion.     Again, thank you for choosing [facility].  Our hope is that these requests will decrease the amount of time that you wait before being seen by our physicians.       _____________________________________________________________  Should you have questions after your visit to [facility], please contact our office at [phone] and follow the prompts.  Our office hours are 8:00 a.m. and 4:30 p.m. Monday - Friday.  Please note that voicemails left after 4:00 p.m. may not be returned  until the following business day.  We are closed weekends and major holidays.  You do have access to a nurse 24-7, just call the main number to the clinic [phone] and do not press any options, hold on the line and a nurse will answer the phone.    For prescription refill requests, have your pharmacy contact our office and allow 72 hours.    Due to Covid, you will need to wear a mask upon entering the hospital. If you do not have a mask, a mask will be given to you at the Main Entrance upon arrival. For doctor visits, patients may have 1 support person age 78 or older with them. For treatment visits, patients can not have anyone with them due to social distancing guidelines and our immunocompromised population.

## 2021-09-10 ENCOUNTER — Inpatient Hospital Stay: Payer: Medicare Other | Admitting: Licensed Clinical Social Worker

## 2021-09-10 ENCOUNTER — Other Ambulatory Visit: Payer: Self-pay | Admitting: Radiology

## 2021-09-10 ENCOUNTER — Telehealth: Payer: Self-pay | Admitting: Licensed Clinical Social Worker

## 2021-09-10 NOTE — Telephone Encounter (Signed)
Called patient to see if she was still planning to keep her genetics appointment 8/17 at 1 pm. She states she is too overwhelmed for any other appointments now but would like the genetic testing to be ordered. [facility] BRCAPlus + CancerNext-Expanded+RNA panel placed today. I will call Ms. Heldman with results when they are back and counsel on the back end.

## 2021-09-11 ENCOUNTER — Other Ambulatory Visit: Payer: Self-pay

## 2021-09-11 ENCOUNTER — Other Ambulatory Visit (HOSPITAL_COMMUNITY): Payer: Self-pay | Admitting: Radiology

## 2021-09-11 ENCOUNTER — Ambulatory Visit (HOSPITAL_COMMUNITY)
Admission: RE | Admit: 2021-09-11 | Discharge: 2021-09-11 | Disposition: A | Discharge status: Home / Self Care | Payer: Medicare Other | Source: Ambulatory Visit | Attending: Gastroenterology | Admitting: Gastroenterology

## 2021-09-11 DIAGNOSIS — C259 Malignant neoplasm of pancreas, unspecified: Secondary | ICD-10-CM | POA: Insufficient documentation

## 2021-09-11 DIAGNOSIS — K8689 Other specified diseases of pancreas: Secondary | ICD-10-CM | POA: Insufficient documentation

## 2021-09-11 MED ORDER — IOHEXOL 300 MG/ML  SOLN
100.0000 mL | Freq: Once | INTRAMUSCULAR | Status: AC | PRN
  Administered 2021-09-11: 100 mL via INTRAVENOUS

## 2021-09-14 ENCOUNTER — Ambulatory Visit (HOSPITAL_COMMUNITY)
Admission: RE | Admit: 2021-09-14 | Discharge: 2021-09-14 | Disposition: A | Discharge status: Home / Self Care | Payer: Medicare Other | Source: Ambulatory Visit | Attending: Hematology | Admitting: Hematology

## 2021-09-14 ENCOUNTER — Other Ambulatory Visit: Payer: Self-pay

## 2021-09-14 ENCOUNTER — Encounter (HOSPITAL_COMMUNITY): Payer: Self-pay

## 2021-09-14 DIAGNOSIS — K219 Gastro-esophageal reflux disease without esophagitis: Secondary | ICD-10-CM | POA: Diagnosis not present

## 2021-09-14 DIAGNOSIS — C259 Malignant neoplasm of pancreas, unspecified: Secondary | ICD-10-CM

## 2021-09-14 DIAGNOSIS — F419 Anxiety disorder, unspecified: Secondary | ICD-10-CM | POA: Insufficient documentation

## 2021-09-14 DIAGNOSIS — F329 Major depressive disorder, single episode, unspecified: Secondary | ICD-10-CM | POA: Insufficient documentation

## 2021-09-14 HISTORY — PX: IR IMAGING GUIDED PORT INSERTION: IMG5740

## 2021-09-14 MED ORDER — MIDAZOLAM HCL 2 MG/2ML IJ SOLN
INTRAMUSCULAR | Status: AC | PRN
  Administered 2021-09-14 (×2): .5 mg via INTRAVENOUS
  Administered 2021-09-14: 1 mg via INTRAVENOUS

## 2021-09-14 MED ORDER — LIDOCAINE-EPINEPHRINE 1 %-1:100000 IJ SOLN
INTRAMUSCULAR | Status: AC | PRN
  Administered 2021-09-14: 20 mL

## 2021-09-14 MED ORDER — LIDOCAINE-EPINEPHRINE 1 %-1:100000 IJ SOLN
INTRAMUSCULAR | Status: AC
  Filled 2021-09-14: qty 1

## 2021-09-14 MED ORDER — FENTANYL CITRATE (PF) 100 MCG/2ML IJ SOLN
INTRAMUSCULAR | Status: AC
  Filled 2021-09-14: qty 2

## 2021-09-14 MED ORDER — FENTANYL CITRATE (PF) 100 MCG/2ML IJ SOLN
INTRAMUSCULAR | Status: AC | PRN
  Administered 2021-09-14 (×2): 25 ug via INTRAVENOUS
  Administered 2021-09-14: 50 ug via INTRAVENOUS

## 2021-09-14 MED ORDER — SODIUM CHLORIDE 0.9 % IV SOLN: INTRAVENOUS | Status: DC

## 2021-09-14 MED ORDER — HEPARIN SOD (PORK) LOCK FLUSH 100 UNIT/ML IV SOLN
INTRAVENOUS | Status: AC
  Filled 2021-09-14: qty 5

## 2021-09-14 MED ORDER — MIDAZOLAM HCL 2 MG/2ML IJ SOLN
INTRAMUSCULAR | Status: AC
  Filled 2021-09-14: qty 2

## 2021-09-14 MED ORDER — LIDOCAINE HCL 1 % IJ SOLN
INTRAMUSCULAR | Status: AC
  Filled 2021-09-14: qty 20

## 2021-09-14 NOTE — Procedures (Signed)
Interventional Radiology Procedure:   Indications: Pancreatic cancer  Procedure: Port placement  Findings: Right jugular port, tip at SVC/RA junction  Complications: None     EBL: Minimal, less than 10 ml  Plan: Discharge in one hour.  Keep port site and incisions dry for at least 24 hours.     Sheryl Zechman R. Jorden Mahl, MD  Pager: [phone]   

## 2021-09-14 NOTE — H&P (Signed)
Chief Complaint: Patient was seen in consultation today for pancreatic cancer  Referring Physician(s): Katragadda,Sreedhar  Supervising Physician: Markus Daft  Patient Status: [facility] - Out-pt  History of Present Illness: Sheryl Bender is a 76 y.o. female with past medical history of anxiety, depression, GERD, pancreatic adenocarcinoma who has plans for upcoming chemotherapy who is in need of durable venous access.  Patient referred to IR for Port-A-Cath placement.   Patient presents to [facility] today in her usual state of health.  She has been NPO.  She is not currently on blood thinners.  Her friend, [name] will be providing transportation and post-recovery care. She denies new complaints or concerns.  She has upcoming appointments this week at the cancer center.   Past Medical History:  Diagnosis Date   Anxiety    Arthritis    Depression    Dysrhythmia    hx palpitations greater than 5 yrs -neg echo, stress per pt ? where or dr   GERD (gastroesophageal reflux disease)    occ   Pancreatic adenocarcinoma ([place])    Pancreatic pseudocyst    Pulmonary embolism [facility])    September 2022    Past Surgical History:  Procedure Laterality Date   ANTERIOR LAT LUMBAR FUSION Right 12/16/2015   Procedure: RIGHT LUMBAR TWO-THREE, LUMBAR THREE-FOUR, LUMBAR FOUR-FIVE ANTEROLATERAL LUMBAR INTERBODY FUSION;  Surgeon: Erline Levine, MD;  Location: [place];  Service: Neurosurgery;  Laterality: Right;   BALLOON DILATION N/A 08/27/2021   Procedure: BALLOON DILATION;  Surgeon: Rush Landmark Telford Nab., MD;  Location: [facility];  Service: Gastroenterology;  Laterality: N/A;   BIOPSY  07/09/2021   Procedure: BIOPSY;  Surgeon: Rush Landmark Telford Nab., MD;  Location: [place];  Service: Gastroenterology;;   CYST GASTROSTOMY  08/27/2021   Procedure: CYST GASTROSTOMY;  Surgeon: Irving Copas., MD;  Location: [facility];  Service: Gastroenterology;;   ESOPHAGOGASTRODUODENOSCOPY N/A  07/09/2021   Procedure: ESOPHAGOGASTRODUODENOSCOPY (EGD);  Surgeon: Irving Copas., MD;  Location: [place];  Service: Gastroenterology;  Laterality: N/A;   ESOPHAGOGASTRODUODENOSCOPY (EGD) WITH PROPOFOL N/A 08/27/2021   Procedure: ESOPHAGOGASTRODUODENOSCOPY (EGD) WITH PROPOFOL;  Surgeon: Rush Landmark Telford Nab., MD;  Location: [facility];  Service: Gastroenterology;  Laterality: N/A;   EUS N/A 07/09/2021   Procedure: UPPER ENDOSCOPIC ULTRASOUND (EUS) RADIAL;  Surgeon: Irving Copas., MD;  Location: [place];  Service: Gastroenterology;  Laterality: N/A;   EUS N/A 08/27/2021   Procedure: UPPER ENDOSCOPIC ULTRASOUND (EUS) LINEAR;  Surgeon: Irving Copas., MD;  Location: [facility];  Service: Gastroenterology;  Laterality: N/A;   FINE NEEDLE ASPIRATION  07/09/2021   Procedure: FINE NEEDLE ASPIRATION (FNA) LINEAR;  Surgeon: Irving Copas., MD;  Location: [place];  Service: Gastroenterology;;   LUMBAR PERCUTANEOUS PEDICLE SCREW 3 LEVEL Bilateral 12/16/2015   Procedure: PERCUTANEOUS PEDICLE SCREWS BILATERALLY AT LUMBAR TWO-FIVE;  Surgeon: Erline Levine, MD;  Location: [place];  Service: Neurosurgery;  Laterality: Bilateral;   PANCREATIC STENT PLACEMENT  08/27/2021   Procedure: PANCREATIC STENT PLACEMENT;  Surgeon: Rush Landmark Telford Nab., MD;  Location: [facility];  Service: Gastroenterology;;   TUBAL LIGATION  1974    Allergies: Other, Cherry, and Wound dressing adhesive  Medications: Prior to Admission medications   Medication Sig Start Date End Date Taking? Authorizing Provider  acetaminophen (TYLENOL) 500 MG tablet Take 2 tablets (1,000 mg total) by mouth every 8 (eight) hours as needed for moderate pain, headache or fever. Patient taking differently: Take 250-500 mg by mouth every 8 (eight) hours as needed for moderate pain,  headache or fever. 07/21/21  Yes Barton Dubois, MD  Aromatic Inhalants (VICKS VAPOINHALER) INHA Inhale 1 puff into the lungs  daily as needed (congestion).   Yes [provider]  ascorbic acid (VITAMIN C) 500 MG tablet Take 500 mg by mouth daily.   Yes [provider]  buPROPion (WELLBUTRIN XL) 300 MG 24 hr tablet Take 300 mg by mouth in the morning. 06/15/21  Yes [provider]  busPIRone (BUSPAR) 10 MG tablet Take 20 mg by mouth in the morning and at bedtime. 01/27/21  Yes [provider]  calcium carbonate (TUMS - DOSED IN MG ELEMENTAL CALCIUM) 500 MG chewable tablet Chew 1 tablet (200 mg of elemental calcium total) by mouth 2 (two) times daily as needed for indigestion or heartburn. 08/31/21  Yes Dwyane Dee, MD  Cholecalciferol (VITAMIN D3) 125 MCG (5000 UT) CAPS Take 5,000 Units by mouth every other day.   Yes [provider]  ciclopirox (LOPROX) 0.77 % cream Apply 1 application  topically at bedtime. Apply to the fingernails   Yes [provider]  escitalopram (LEXAPRO) 20 MG tablet Take 20 mg by mouth daily.   Yes [provider]  lactose free nutrition (BOOST) LIQD Take 237 mLs by mouth 2 (two) times daily between meals.   Yes [provider]  linaclotide Rolan Lipa) 72 MCG capsule Take 1 capsule (72 mcg total) by mouth daily before breakfast. 07/22/21  Yes Barton Dubois, MD  metoprolol succinate (TOPROL-XL) 25 MG 24 hr tablet Take 25 mg by mouth daily.   Yes [provider]  mirtazapine (REMERON) 30 MG tablet Take 30 mg by mouth at bedtime. 06/15/21  Yes [provider]  polyethylene glycol powder (MIRALAX) 17 GM/SCOOP powder Take 1 teaspoonful once daily   Yes [provider]  QUEtiapine (SEROQUEL) 200 MG tablet Take 200 mg by mouth at bedtime. 03/27/21  Yes [provider]  QUEtiapine (SEROQUEL) 25 MG tablet Take 25 mg by mouth 3 (three) times daily after meals.   Yes [provider]  sodium chloride (OCEAN) 0.65 % SOLN nasal spray Place 1 spray into both nostrils as needed for congestion.   Yes  [provider]  vitamin B-12 (CYANOCOBALAMIN) 500 MCG tablet Take 1 tablet (500 mcg total) by mouth daily. 07/22/21  Yes Barton Dubois, MD  Zinc 25 MG TABS Take 25 mg by mouth daily.   Yes [provider]  alum & mag hydroxide-simeth (MAALOX/MYLANTA) 200-200-20 MG/5ML suspension Take 30 mLs by mouth every 4 (four) hours as needed for indigestion. Patient not taking: Reported on 09/09/2021 08/31/21   Dwyane Dee, MD  ondansetron (ZOFRAN-ODT) 8 MG disintegrating tablet Take 1 tablet (8 mg total) by mouth every 8 (eight) hours as needed for nausea or vomiting. 07/21/21   Barton Dubois, MD  oxyCODONE (OXY IR/ROXICODONE) 5 MG immediate release tablet Take 1 tablet (5 mg total) by mouth every 6 (six) hours as needed for severe pain. Patient not taking: Reported on 09/09/2021 07/21/21   Barton Dubois, MD     Family History  Problem Relation Age of Onset   Pancreatitis Brother        alcoholic pancreatitis   Pancreatic cancer Neg Hx    Colon cancer Neg Hx     Social History   Socioeconomic History   Marital status: Widowed    Spouse name: Not on file   Number of children: Not on file   Years of education: Not on file   Highest education level:  Not on file  Occupational History   Not on file  Tobacco Use   Smoking status: Never   Smokeless tobacco: Never  Substance and Sexual Activity   Alcohol use: No   Drug use: Never   Sexual activity: Not on file  Other Topics Concern   Not on file  Social History Narrative   Not on file   Social Determinants of Health   Financial Resource Strain: Not on file  Food Insecurity: Not on file  Transportation Needs: Not on file  Physical Activity: Not on file  Stress: Not on file  Social Connections: Not on file     Review of Systems: A 12 point ROS discussed and pertinent positives are indicated in the HPI above.  All other systems are negative.  Review of Systems  Constitutional:  Negative for fatigue and fever.   Respiratory:  Negative for cough and shortness of breath.   Cardiovascular:  Negative for chest pain.  Gastrointestinal:  Negative for abdominal pain and nausea.  Musculoskeletal:  Negative for back pain.  Psychiatric/Behavioral:  Negative for behavioral problems and confusion.     Vital Signs: BP 123/78   Pulse 77   Temp 97.6 F (36.4 C) (Oral)   Resp 14   Ht 5' 2.5" (1.588 m)   Wt 122 lb (55.3 kg)   SpO2 99%   BMI 21.96 kg/m   Physical Exam Vitals and nursing note reviewed.  Constitutional:      General: She is not in acute distress.    Appearance: Normal appearance. She is not ill-appearing.  HENT:     Mouth/Throat:     Mouth: Mucous membranes are moist.     Pharynx: Oropharynx is clear.  Cardiovascular:     Rate and Rhythm: Normal rate and regular rhythm.  Skin:    General: Skin is warm and dry.  Neurological:     Mental Status: She is alert.      MD Evaluation Airway: WNL Heart: WNL Abdomen: WNL Chest/ Lungs: WNL ASA  Classification: 3 Mallampati/Airway Score: One   Imaging: CT ABDOMEN W CONTRAST  Result Date: 09/14/2021 CLINICAL DATA:  76 year old female with history of pancreatic cancer. Follow-up study. EXAM: CT ABDOMEN WITH CONTRAST TECHNIQUE: Multidetector CT imaging of the abdomen was performed using the standard protocol following bolus administration of intravenous contrast. RADIATION DOSE REDUCTION: This exam was performed according to the departmental dose-optimization program which includes automated exposure control, adjustment of the mA and/or kV according to patient size and/or use of iterative reconstruction technique. CONTRAST:  125m OMNIPAQUE IOHEXOL 300 MG/ML  SOLN COMPARISON:  Abdominal MRI 08/03/2021. PET-CT 07/30/2021. CT of the abdomen and pelvis 07/13/2021. FINDINGS: Lower chest: Unremarkable. Hepatobiliary: No suspicious cystic or solid hepatic lesions. No intra or extrahepatic biliary ductal dilatation. Gallbladder is normal in  appearance. Pancreas: Again noted is diffuse dilatation of the main pancreatic duct which measures up to 11 mm in the body of the pancreas. Atrophy of the pancreatic parenchyma throughout the body and tail of the pancreas. Pancreatic parenchyma in the head and uncinate process appears preserved. No discrete pancreatic mass confidently identified on today's examination. Compared to the prior study there has been interval cystogastrostomy with complete decompression of the previously noted pancreatic pseudocyst adjacent to the head and proximal body the of the pancreas and the distal aspect of the stomach. Spleen: Unremarkable. Adrenals/Urinary Tract: Multiple low-attenuation lesions in the renal pelvises bilaterally, compatible with parapelvic cysts. No suspicious renal lesions are noted. No hydroureteronephrosis in the  visualized portions of the abdomen. Bilateral adrenal glands are normal in appearance. Stomach/Bowel: Postprocedural changes of prior cysto gastrostomy. Thickening of the antral pre-pyloric region of the stomach, which could suggest antral gastritis. No pathologic dilatation of visualized portions of small bowel or colon. Vascular/Lymphatic: Atherosclerosis of the abdominal aorta. No aneurysm or dissection noted in the abdominal or pelvic vasculature. Superior mesenteric vein, splenic vein, splenoportal confluence and portal vein are all widely patent at this time. No lymphadenopathy noted in the abdomen. Other: No significant volume of ascites noted in the visualized portions of the peritoneal cavity. Musculoskeletal: Postoperative changes of PLIF from L2-L5 with interbody grafts at L2-L3, L3-L4 and L4-L5. There are no aggressive appearing lytic or blastic lesions noted in the visualized portions of the skeleton. IMPRESSION: 1. Compared to the prior examination there has been interval cystogastrostomy with decompression of the previously noted pseudocyst between the distal body and head of the  pancreas, and the adjacent stomach. There is persistent dilatation of the main pancreatic duct which measures up to 11 mm in the body of the pancreas. This abruptly terminates in the region of the pancreatic neck. No discrete pancreatic mass confidently identified on today's examination. 2. Aortic atherosclerosis. 3. Additional incidental findings, as above. Electronically Signed   By: Vinnie Langton M.D.   On: 09/14/2021 08:52    Labs:  CBC: Recent Labs    08/28/21 0452 08/29/21 0548 08/30/21 0612 08/31/21 0505  WBC 6.9 9.9 9.5 6.5  HGB 10.2* 10.4* 10.5* 10.3*  HCT 32.1* 32.8* 32.6* 32.8*  PLT 337 320 310 300    COAGS: Recent Labs    07/19/21 0419 08/24/21 0522  INR 1.0 1.2    BMP: Recent Labs    08/28/21 0452 08/29/21 0548 08/30/21 0612 08/31/21 0505  NA 137 139 144 141  K 3.7 3.4* 3.6 3.4*  CL 103 103 112* 110  CO2 '26 28 27 24  '$ GLUCOSE 90 102* 90 86  BUN 7* 5* <5* 6*  CALCIUM 8.1* 8.0* 8.6* 8.2*  CREATININE 0.63 0.75 0.69 0.70  GFRNONAA >60 >60 >60 >60    LIVER FUNCTION TESTS: Recent Labs    08/28/21 0452 08/29/21 0548 08/30/21 0612 08/31/21 0505  BILITOT 0.6 0.4 0.4 0.5  AST 14* 12* 12* 13*  ALT '15 12 12 13  '$ ALKPHOS 54 56 58 57  PROT 5.3* 5.3* 5.5* 5.6*  ALBUMIN 2.3* 2.4* 2.4* 2.5*    TUMOR MARKERS: No results for input(s): "AFPTM", "CEA", "CA199", "CHROMGRNA" in the last 8760 hours.  Assessment and Plan: Patient with past medical history of anxiety, depression, GERD, recent diagnosis of pancreatic cancer presents with complaint of poor venous access.  IR consulted for Port-A-Cath placement at the request of Dr. Delton Coombes. Case reviewed by Dr. Anselm Pancoast who approves patient for procedure.  Patient presents today in their usual state of health.  She has been NPO and is not currently on blood thinners.   Risks and benefits of image guided port-a-catheter placement was discussed with the patient including, but not limited to bleeding, infection,  pneumothorax, or fibrin sheath development and need for additional procedures.  All of the patient's questions were answered, patient is agreeable to proceed. Consent signed and in chart.  Thank you for this interesting consult.  I greatly enjoyed meeting Delany Steury and look forward to participating in their care.  A copy of this report was sent to the requesting provider on this date.  Electronically Signed: Docia Barrier, PA 09/14/2021, 10:49 AM  I spent a total of  30 Minutes   in face to face in clinical consultation, greater than 50% of which was counseling/coordinating care for pancreatic cancer

## 2021-09-15 ENCOUNTER — Other Ambulatory Visit: Payer: Self-pay

## 2021-09-15 ENCOUNTER — Inpatient Hospital Stay: Payer: Medicare Other

## 2021-09-15 ENCOUNTER — Encounter: Payer: Self-pay | Admitting: Hematology

## 2021-09-15 DIAGNOSIS — C259 Malignant neoplasm of pancreas, unspecified: Secondary | ICD-10-CM

## 2021-09-15 DIAGNOSIS — Z95828 Presence of other vascular implants and grafts: Secondary | ICD-10-CM | POA: Insufficient documentation

## 2021-09-15 DIAGNOSIS — C250 Malignant neoplasm of head of pancreas: Secondary | ICD-10-CM

## 2021-09-15 HISTORY — DX: Presence of other vascular implants and grafts: Z95.828

## 2021-09-15 MED ORDER — PROCHLORPERAZINE MALEATE 10 MG PO TABS: 10.0000 mg | ORAL_TABLET | Freq: Four times a day (QID) | ORAL | 3 refills | Status: DC | PRN

## 2021-09-15 MED ORDER — LIDOCAINE-PRILOCAINE 2.5-2.5 % EX CREA: TOPICAL_CREAM | CUTANEOUS | 3 refills | Status: DC

## 2021-09-15 NOTE — Patient Instructions (Addendum)
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 You will be treated in the clinic every 2 weeks with a combination of chemotherapy drugs. Those drugs are irinotecan, oxaliplatin, and fluorouracil (5FU). For your first cycle of treatment, we will not give the irinotecan, just to see how you tolerate the other 2 drugs. If you do well after your first treatment, we will add the irinotecan to your subsequent treatments. There is also another drug you will receive called leucovorin - this is not chemotherapy. This is a vitamin that helps the 5FU work better. The intent of treatment is cure. You will see the doctor regularly throughout treatment.  We will obtain blood work from you prior to every treatment and monitor your results to make sure it is safe to give your treatment. The doctor monitors your response to treatment by the way you are feeling, your blood work, and by obtaining scans periodically. There will be wait times while you are here for treatment.  It will take about 30 minutes to 1 hour for your lab work to result.  Then there will be wait times while pharmacy mixes your medications.    Medications you will receive in the clinic prior to your chemotherapy medications:  Aloxi:  ALOXI is used in adults to help prevent nausea and vomiting that happens with certain chemotherapy drugs.  Aloxi is a long acting medication, and will remain in your system for about two days.   Emend:  This is an anti-nausea medication that is used with Aloxi to help prevent nausea and vomiting caused by chemotherapy.  Dexamethasone:  This is a steroid given prior to chemotherapy to help prevent allergic reactions; it may also help prevent and control nausea and diarrhea.     Oxaliplatin (Eloxatin)  About This Drug  Oxaliplatin is used to treat cancer. It is given in the vein (IV).  It takes two hours to infuse.  Possible Side Effects   Bone marrow  suppression. This is a decrease in the number of white blood cells, red blood cells, and platelets. This may raise your risk of infection, make you tired and weak (fatigue), and raise your risk of bleeding.   Tiredness   Soreness of the mouth and throat. You may have red areas, white patches, or sores that hurt.   Nausea and vomiting (throwing up)   Diarrhea (loose bowel movements)   Changes in your liver function   Effects on the nerves called peripheral neuropathy. You may feel numbness, tingling, or pain in your hands and feet, and may be worse in cold temperatures. It may be hard for you to button your clothes, open jars, or walk as usual. The effect on the nerves may get worse with more doses of the drug. These effects get better in some people after the drug is stopped but it does not get better in all people  Note: Each of the side effects above was reported in 40% or greater of patients treated with oxaliplatin. Not all possible side effects are included above.   Warnings and Precautions   Allergic reactions, including anaphylaxis, which may be life-threatening are rare but may happen in some patients. Signs of allergic reaction to this drug may be swelling of the face, feeling like your tongue or throat are swelling, trouble breathing, rash, itching, fever, chills, feeling dizzy, and/or feeling that your heart is beating in a fast or not normal way. If this happens, do not  take another dose of this drug. You should get urgent medical treatment.   Inflammation (swelling) of the lungs, which may be life-threatening. You may have a dry cough or trouble breathing.   Effects on the nerves (neuropathy) may resolve within 14 days, or it may persist beyond 14 days.   Severe decrease in white blood cells when combined with the chemotherapy agents 5-fluorouracil and leucovorin. This may be life-threatening.   Severe changes in your liver function   Abnormal heart beat and/or EKG, which  can be life-threatening   Rhabdomyolysis- damage to your muscles which may release proteins in your blood and affect how your kidneys work, which can be life-threatening. You may have severe muscle weakness and/or pain, or dark urine.  Important Information   This drug may impair your ability to drive or use machinery. Talk to your doctor and/or nurse about precautions you may need to take.   This drug may be present in the saliva, tears, sweat, urine, stool, vomit, semen, and vaginal secretions. Talk to your doctor and/or your nurse about the necessary precautions to take during this time.  * The effects on the nerves can be aggravated by exposure to cold. Avoid cold beverages, use of ice and make sure you cover your skin and dress warmly prior to being exposed to cold temperatures while you are receiving treatment with oxaliplatin*   Treating Side Effects   Manage tiredness by pacing your activities for the day.   Be sure to include periods of rest between energy-draining activities.   To decrease the risk of infection, wash your hands regularly.   Avoid close contact with people who have a cold, the flu, or other infections.  Take your temperature as your doctor or nurse tells you, and whenever you feel like you may have a fever.   To help decrease the risk of bleeding, use a soft toothbrush. Check with your nurse before using dental floss.   Be very careful when using knives or tools.   Use an electric shaver instead of a razor.   Drink plenty of fluids (a minimum of eight glasses per day is recommended).   Mouth care is very important. Your mouth care should consist of routine, gentle cleaning of your teeth or dentures and rinsing your mouth with a mixture of 1/2 teaspoon of salt in 8 ounces of water or 1/2 teaspoon of baking soda in 8 ounces of water. This should be done at least after each meal and at bedtime.   If you have mouth sores, avoid mouthwash that has alcohol. Also  avoid alcohol and smoking because they can bother your mouth and throat.   To help with nausea and vomiting, eat small, frequent meals instead of three large meals a day. Choose foods and drinks that are at room temperature. Ask your nurse or doctor about other helpful tips and medicine that is available to help stop or lessen these symptoms.   If you throw up or have loose bowel movements, you should drink more fluids so that you do not become dehydrated (lack of water in the body from losing too much fluid).   If you have diarrhea, eat low-fiber foods that are high in protein and calories and avoid foods that can irritate your digestive tracts or lead to cramping.   Ask your nurse or doctor about medicine that can lessen or stop your diarrhea.   If you have numbness and tingling in your hands and feet, be careful when cooking,  walking, and handling sharp objects and hot liquids.   Do not drink cold drinks or use ice in beverages. Drink fluids at room temperature or warmer, and drink through a straw.   Wear gloves to touch cold objects, and wear warm clothing and cover you skin during cold weather.   Food and Drug Interactions   There are no known interactions of oxaliplatin with food and other medications.   This drug may interact with other medicines. Tell your doctor and pharmacist about all the prescription and over-the-counter medicines and dietary supplements (vitamins, minerals, herbs and others) that you are taking at this time. Also, check with your doctor or pharmacist before starting any new prescription or over-the-counter medicines, or dietary supplements to make sure that there are no interactions   When to Call the Doctor  Call your doctor or nurse if you have any of these symptoms and/or any new or unusual symptoms:   Fever of 100.4 F (38 C) or higher   Chills   Tiredness that interferes with your daily activities   Feeling dizzy or lightheaded   Easy bleeding  or bruising   Feeling that your heart is beating in a fast or not normal way (palpitations)   Pain in your chest   Dry cough   Trouble breathing   Pain in your mouth or throat that makes it hard to eat or drink   Nausea that stops you from eating or drinking and/or is not relieved by prescribed medicines   Throwing up   Diarrhea, 4 times in one day or diarrhea with lack of strength or a feeling of being dizzy   Numbness, tingling, or pain in your hands and feet   Signs of possible liver problems: dark urine, pale bowel movements, bad stomach pain, feeling very tired and weak, unusual itching, or yellowing of the eyes or skin   Signs of rhabdomyolysis: decreased urine, very dark urine, muscle pain in the shoulders, thighs, or lower back; muscle weakness or trouble moving arms and legs   Signs of allergic reaction: swelling of the face, feeling like your tongue or throat are swelling, trouble breathing, rash, itching, fever, chills, feeling dizzy, and/or feeling that your heart is beating in a fast or not normal way. If this happens, call 911 for emergency care.   If you think you may be pregnant  Reproduction Warnings   Pregnancy warning: This drug may have harmful effects on the unborn baby. Women of childbearing potential should use effective methods of birth control during your cancer treatment. Let your doctor know right away if you think you may be pregnant or may have impregnated your partner.   Breastfeeding warning: It is not known if this drug passes into breast milk. For this reason, women should talk to their doctor about the risks and benefits of breastfeeding during treatment with this drug because this drug may enter the breast milk and cause harm to a breastfeeding baby.   Fertility warning: Human fertility studies have not been done with this drug. Talk with your doctor or nurse if you plan to have children. Ask for information on sperm or egg banking.   Irinotecan  (Camptosar)    About This Drug  Irinotecan is used to treat cancer. It is given in the vein (IV). It will take 1.5 hours to infuse.    Possible Side Effects   Bone marrow suppression. This is a decrease in the number of white blood cells, red blood cells, and platelets.  This may raise your risk of infection, make you tired and weak, and raise your risk of bleeding.    Soreness of the mouth and throat. You may have red areas, white patches, or sores that hurt.    Nausea and vomiting (throwing up)    Pain in your abdomen  Diarrhea (loose bowel movements)    Constipation (unable to move bowels)    Decreased appetite (decreased hunger)    Weight loss    Changes in your liver function    Pain    Weakness    Fever    Infection    Hair loss. Hair loss is often temporary, although with certain medicine, hair loss can sometimes be permanent. Hair loss may happen suddenly or gradually. If you lose hair, you may lose it from your head, face, armpits, pubic area, chest, and/or legs. You may also notice your hair getting thin.   Note: Each of the side effects above was reported in 30% or greater of patients treated with irinotecan alone or in combination with other chemotherapy. Not all possible side effects are included above.   Warnings and Precautions    Severe diarrhea and colitis which is swelling (inflammation) in the colon - symptoms are diarrhea, stomach cramping, and sometimes blood in the bowel movements. Very rarely, an abnormal hole in your stomach, small and/or large intestine can happen. Diarrhea can begin shortly after the infusion or up to a week or two after, and can be life-threatening if it leads to dehydration, and other complications.     Severe bone marrow suppression which can be life-threatening    Allergic reactions, including anaphylaxis are rare but may happen in some patients. Signs of allergic reaction to this drug may be swelling of the face, feeling like your  tongue or throat are swelling, trouble breathing, rash, itching, fever, chills, feeling dizzy, and/or feeling that your heart is beating in a fast or not normal way. If this happens, do not take another dose of this drug. You should get urgent medical treatment.    Changes in your kidney function which can cause kidney failure and be life-threatening    Inflammation (swelling) or scarring of the lungs, which can be life-threatening. You may have a cough or trouble breathing. Note: Some of the side effects above are very rare. If you have concerns and/or questions, please discuss them with your medical team. Important Information    This drug may be present in the saliva, tears, sweat, urine, stool, vomit, semen, and vaginal secretions. Talk to your doctor and/or your nurse about the necessary precautions to take during this time.    It is important that you notify your doctor and/or nurse at the first sign of diarrhea, so they can provide you with anti-diarrheal medication and give you further instructions. Notify your doctor and/ or nurse if you are taking anti-diarrheal medication and your symptoms have not improved or are worsening.    Treating Side Effects    Manage tiredness by pacing your activities for the day.    Be sure to include periods of rest between energy-draining activities.    To decrease the risk of infection, wash your hands regularly.  Avoid close contact with people who have a cold, the flu, or other infections.    Take your temperature as your doctor or nurse tells you, and whenever you feel like you may have a fever.    To help decrease the risk of bleeding, use a soft toothbrush. Check  with your nurse before using dental floss.    Be very careful when using knives or tools.    Use an electric shaver instead of a razor.    Drink plenty of fluids (a minimum of eight glasses per day is recommended).    If you throw up or have diarrhea, you should drink more fluids so  that you do not become dehydrated (lack of water in the body from losing too much fluid).    Mouth care is very important. Your mouth care should consist of routine, gentle cleaning of your teeth or dentures and rinsing your mouth with a mixture of 1/2 teaspoon of salt in 8 ounces of water or 1/2 teaspoon of baking soda in 8 ounces of water. This should be done at least after each meal and at bedtime.    If you have mouth sores, avoid mouthwash that has alcohol. Also avoid alcohol and smoking because they can bother your mouth and throat.     To help with nausea and vomiting, eat small, frequent meals instead of three large meals a day. Choose foods and drinks that are at room temperature. Ask your nurse or doctor about other helpful tips and medicine that is available to help stop or lessen these symptoms.    If you have diarrhea, eat low-fiber foods that are high in protein and calories and avoid foods that can irritate your digestive tracts or lead to cramping.    If you are not able to move your bowels, check with your doctor or nurse before you use enemas, laxatives, or suppositories.    Ask your doctor or nurse about medicines that are available to help stop or lessen constipation and/ or diarrhea.    To help with decreased appetite, eat small, frequent meals. Eat foods high in calories and protein, such as meat, poultry, fish, dry beans, tofu, eggs, nuts, milk, yogurt, cheese, ice cream, pudding, and nutritional supplements.    Consider using sauces and spices to increase taste. Daily exercise, with your doctor's approval, may increase your appetite.    To help with weight loss, drink fluids that contribute calories (whole milk, juice, soft drinks, sweetened beverages, milkshakes, and nutritional supplements) instead of water.    Keeping your pain under control is important to your well-being. Please tell your doctor or nurse if you are experiencing pain.    To help with hair loss, wash  with a mild shampoo and avoid washing your hair every day. Avoid coloring your hair.    Avoid rubbing your scalp, pat your hair or scalp dry.    Limit your use of hair spray, electric curlers, blow dryers, and curling irons.    If you are interested in getting a wig, talk to your nurse and they can help you get in touch with programs in your local area.    Food and Drug Interactions    This drug may interact with grapefruit and grapefruit juice. Talk to your doctor as this could make side effects worse.    Check with your doctor or pharmacist about all other prescription medicines and over-the-counter medicines and dietary supplements (vitamins, minerals, herbs, and others) you are taking before starting this medicine as there are known drug interactions with irinotecan. Also, check with your doctor or pharmacist before starting any new prescription or over-the-counter medicines, or dietary supplements to make sure that there are no interactions.    Avoid the use of St. John's Wort while taking irinotecan as this may  lower the levels of the drug in your body, which can make it less effective.    When to Call the Doctor   Call your doctor or nurse if you have any of these symptoms and/or any new or unusual symptoms:    Fever of 100.4 F (38 C) or higher    Chills    Pain in your chest     Dry cough    Trouble breathing and/or wheezing    Feeling dizzy or lightheaded    Easy bleeding or bruising    Tiredness or weakness that interferes with your daily activities    Pain in your mouth or throat that makes it hard to eat or drink    Nausea that stops you from eating or drinking and/or is not relieved by prescribed medicines    Throwing up   Diarrhea, 4 times in one day or diarrhea with lack of strength or a feeling of being dizzy    No bowel movement in 3 days or when you feel uncomfortable    Severe abdominal pain that does not go away    Difficulty swallowing     General pain that does not go away, or is not relieved by prescribed medicines    Blood in your stool    Lasting loss of appetite or rapid weight loss of five pounds in a week    Decreased or very dark urine    Signs of allergic reaction: swelling of the face, feeling like your tongue or throat are swelling, trouble breathing, rash, itching, fever, chills, feeling dizzy, and/or feeling that your heart is beating in a fast or not normal way. If this happens, call 911 for emergency care.    Signs of possible liver problems: dark urine, pale bowel movements, pain in your abdomen, feeling very tired and weak, unusual itching, or yellowing of the eyes or skin    If you think you may be pregnant or may have impregnated your partner    Reproduction Warnings    Pregnancy warning: This drug can have harmful effects on the unborn baby. Women of childbearing potential should use effective methods of birth control during your cancer treatment and for 6 months after treatment. Men with female partners of childbearing potential should use effective methods of birth control during your cancer treatment and for 3 months after your cancer treatment. Let your doctor know right away if you think you may be pregnant or may have impregnated your partner.    Breastfeeding warning: Women should not breastfeed during treatment and for at least 7 days after treatment because this drug can enter the breast milk and cause harm to a breastfeeding baby.  Fertility warning: In men and women both, this drug may affect your ability to have children in the future. Talk with your doctor or nurse if you plan to have children. Ask for information on sperm or egg banking. Revised August 2021   Leucovorin Calcium  About This Drug  Leucovorin is a vitamin. It is used in combination with other cancer fighting drugs such as 5-fluorouracil and methotrexate. Leucovorin is given in the vein (IV).  This drug runs at the same time as  the oxaliplatin and takes 2 hours to infuse.   Possible Side Effects  Rash and itching  Note: Leucovorin by itself has very few side effects. Other side effects you may have can be caused by the other drugs you are taking, such as 5-fluorouracil.    Warnings and Precautions  Allergic reactions, including anaphylaxis are rare but may happen in some patients. Signs of allergic reaction to this drug may be swelling of the face, feeling like your tongue or throat are swelling, trouble breathing, rash, itching, fever, chills, feeling dizzy, and/or feeling that your heart is beating in a fast or not normal way. If this happens, do not take another dose of this drug. You should get urgent medical treatment.   Food and Drug Interactions   There are no known interactions of leucovorin with food.   This drug may interact with other medicines. Tell your doctor and pharmacist about all the prescription and over-the-counter medicines and dietary supplements (vitamins, minerals, herbs and others) that you are taking at this time.   Also, check with your doctor or pharmacist before starting any new prescription or over-the-counter medicines, or dietary supplements to make sure that there are no interactions.   When to Call the Doctor  Call your doctor or nurse if you have any of these symptoms and/or any new or unusual symptoms:   A new rash or a rash that is not relieved by prescribed medicines   Signs of allergic reaction: swelling of the face, feeling like your tongue or throat are swelling, trouble breathing, rash, itching, fever, chills, feeling dizzy, and/or feeling that your heart is beating in a fast or not normal way. If this happens, call 911 for emergency care.   If you think you may be pregnant   Reproduction Warnings   Pregnancy warning: It is not known if this drug may harm an unborn child. For this reason, be sure to talk with your doctor if you are pregnant or planning to become  pregnant while receiving this drug. Let your doctor know right away if you think you may be pregnant   Breastfeeding warning: It is not known if this drug passes into breast milk. For this reason, women should talk to their doctor about the risks and benefits of breastfeeding during treatment with this drug because this drug may enter the breast milk and cause harm to a breastfeeding baby.   Fertility warning: Human fertility studies have not been done with this drug. Talk with your doctor or nurse if you plan to have children. Ask for information on sperm or egg banking.   5-Fluorouracil (Adrucil; 5FU)  About This Drug  Fluorouracil is used to treat cancer. It is given in the vein (IV). It is given as an IV push from a syringe and also as a continuous infusion given via an ambulatory pump (a pump you take home and wear for a specified amount of time).  Possible Side Effects   Bone marrow suppression. This is a decrease in the number of white blood cells, red blood cells, and platelets. This may raise your risk of infection, make you tired and weak (fatigue), and raise your risk of bleeding   Changes in the tissue of the heart and/or heart attack. Some changes may happen that can cause your heart to have less ability to pump blood.   Blurred vision or other changes in eyesight   Nausea and throwing up (vomiting)   Diarrhea (loose bowel movements)   Ulcers - sores that may cause pain or bleeding in your digestive tract, which includes your mouth, esophagus, stomach, small/large intestines and rectum   Soreness of the mouth and throat. You may have red areas, white patches, or sores that hurt.   Allergic reactions, including anaphylaxis are rare but may happen in  some patients. Signs of allergic reaction to this drug may be swelling of the face, feeling like your tongue or throat are swelling, trouble breathing, rash, itching, fever, chills, feeling dizzy, and/or feeling that your heart  is beating in a fast or not normal way. If this happens, do not take another dose of this drug. You should get urgent medical treatment.   Sensitivity to light (photosensitivity). Photosensitivity means that you may become more sensitive to the sun and/or light. You may get a skin rash/reaction if you are in the sun or are exposed to sun lamps and tanning beds. Your eyes may water more, mostly in bright light.   Changes in your nail color, nail loss and/or brittle nail   Darkening of the skin, or changes to the color of your skin and/or veins used for infusion   Rash, dry skin, or itching  Note: Not all possible side effects are included above.  Warnings and Precautions   Hand-and-foot syndrome. The palms of your hands or soles of your feet may tingle, become numb, painful, swollen, or red.   Changes in your central nervous system can happen. The central nervous system is made up of your brain and spinal cord. You could feel extreme tiredness, agitation, confusion, hallucinations (see or hear things that are not there), trouble understanding or speaking, loss of control of your bowels or bladder, eyesight changes, numbness or lack of strength to your arms, legs, face, or body, or coma. If you start to have any of these symptoms let your doctor know right away.   Side effects of this drug may be unexpectedly severe in some patients  Note: Some of the side effects above are very rare. If you have concerns and/or questions, please discuss them with your medical team.   Important Information   This drug may be present in the saliva, tears, sweat, urine, stool, vomit, semen, and vaginal secretions. Talk to your doctor and/or your nurse about the necessary precautions to take during this time.   Treating Side Effects   Manage tiredness by pacing your activities for the day.   Be sure to include periods of rest between energy-draining activities.   To help decrease the risk of infections,  wash your hands regularly.   Avoid close contact with people who have a cold, the flu, or other infections.   Take your temperature as your doctor or nurse tells you, and whenever you feel like you may have a fever.   Use a soft toothbrush. Check with your nurse before using dental floss.   Be very careful when using knives or tools.   Use an electric shaver instead of a razor.   If you have a nose bleed, sit with your head tipped slightly forward. Apply pressure by lightly pinching the bridge of your nose between your thumb and forefinger. Call your doctor if you feel dizzy or faint or if the bleeding doesn't stop after 10 to 15 minutes.   Drink plenty of fluids (a minimum of eight glasses per day is recommended).   If you throw up or have loose bowel movements, you should drink more fluids so that you do not  become dehydrated (lack of water in the body from losing too much fluid).   To help with nausea and vomiting, eat small, frequent meals instead of three large meals a day. Choose foods and drinks that are at room temperature. Ask your nurse or doctor about other helpful tips and medicine that is  available to help, stop, or lessen these symptoms.   If you have diarrhea, eat low-fiber foods that are high in protein and calories and avoid foods that can irritate your digestive tracts or lead to cramping.   Ask your nurse or doctor about medicine that can lessen or stop your diarrhea.   Mouth care is very important. Your mouth care should consist of routine, gentle cleaning of your teeth or dentures and rinsing your mouth with a mixture of 1/2 teaspoon of salt in 8 ounces of water or 1/2 teaspoon of baking soda in 8 ounces of water. This should be done at least after each meal and at bedtime.   If you have mouth sores, avoid mouthwash that has alcohol. Also avoid alcohol and smoking because they can bother your mouth and throat.   Keeping your nails moisturized may help with  brittleness.   To help with itching, moisturize your skin several times day.   Use sunscreen with SPF 30 or higher when you are outdoors even for a short time. Cover up when you are out in the sun. Wear wide-brimmed hats, long-sleeved shirts, and pants. Keep your neck, chest, and back covered. Wear dark sun glasses when in the sun or bright lights.   If you get a rash do not put anything on it unless your doctor or nurse says you may. Keep the area around the rash clean and dry. Ask your doctor for medicine if your rash bothers you.   Keeping your pain under control is important to your well-being. Please tell your doctor or nurse if you are experiencing pain.   Food and Drug Interactions   There are no known interactions of fluorouracil with food.   Check with your doctor or pharmacist about all other prescription medicines and over-the-counter medicines and dietary supplements (vitamins, minerals, herbs and others) you are taking before starting this medicine as there are known drug interactions with 5-fluoroucacil. Also, check with your doctor or pharmacist before starting any new prescription or over-the-counter medicines, or dietary supplements to make sure that there are no interactions.   When to Call the Doctor  Call your doctor or nurse if you have any of these symptoms and/or any new or unusual symptoms:   Fever of 100.4 F (38 C) or higher   Chills   Easy bleeding or bruising   Nose bleed that doesn't stop bleeding after 10-15 minutes   Trouble breathing   Feeling dizzy or lightheaded   Feeling that your heart is beating in a fast or not normal way (palpitations)   Chest pain or symptoms of a heart attack. Most heart attacks involve pain in the center of the chest that lasts more than a few minutes. The pain may go away and come back or it can be constant. It can feel like pressure, squeezing, fullness, or pain. Sometimes pain is felt in one or both arms, the back,  neck, jaw, or stomach. If any of these symptoms last 2 minutes, call 911.   Confusion and/or agitation   Hallucinations   Trouble understanding or speaking   Loss of control of bowels or bladder   Blurry vision or changes in your eyesight   Headache that does not go away   Numbness or lack of strength to your arms, legs, face, or body   Nausea that stops you from eating or drinking and/or is not relieved by prescribed medicines   Throwing up    Diarrhea, 4 times in one  day or diarrhea with lack of strength or a feeling of being dizzy   Pain in your mouth or throat that makes it hard to eat or drink   Pain along the digestive tract - especially if worse after eating   Blood in your vomit (bright red or coffee-ground) and/or stools (bright red, or black/tarry)   Coughing up blood   Tiredness that interferes with your daily activities   Painful, red, or swollen areas on your hands or feet or around your nails   A new rash or a rash that is not relieved by prescribed medicines   Develop sensitivity to sunlight/light   Numbness and/or tingling of your hands and/or feet   Signs of allergic reaction: swelling of the face, feeling like your tongue or throat are swelling, trouble breathing, rash, itching, fever, chills, feeling dizzy, and/or feeling that your heart is beating in a fast or not normal way. If this happens, call 911 for emergency care.   If you think you are pregnant or may have impregnated your partner  Reproduction Warnings   Pregnancy warning: This drug may have harmful effects on the unborn baby. Women of child bearing potential should use effective methods of birth control during your cancer treatment and 3 months after treatment. Men with female partners of childbearing potential should use effective methods of birth control during your cancer treatment and for 3 months after your cancer treatment. Let your doctor know right away if you think you may be  pregnant or may have impregnated your partner.   Breastfeeding warning: It is not known if this drug passes into breast milk. For this reason, Women should not breastfeed during treatment because this drug could enter the breast milk and cause harm to a breastfeeding baby.   Fertility warning: In men and women both, this drug may affect your ability to have children in the future. Talk with your doctor or nurse if you plan to have children. Ask for information on sperm or egg banking.   SELF CARE ACTIVITIES WHILE RECEIVING CHEMOTHERAPY:  Hydration Increase your fluid intake and drink at least 8 to 12 cups (64 ounces) of water/decaffeinated beverages per day after treatment. You can still have your cup of coffee or soda but these beverages do not count as part of your 8 to 12 cups that you need to drink daily. No alcohol intake.  Medications Continue taking your normal prescription medication as prescribed.  If you start any new herbal or new supplements please let us know first to make sure it is safe.  Mouth Care Have teeth cleaned professionally before starting treatment. Keep dentures and partial plates clean. Use soft toothbrush and do not use mouthwashes that contain alcohol. Biotene is a good mouthwash that is available at most pharmacies or may be ordered by calling [phone]. Use warm salt water gargles (1 teaspoon salt per 1 quart warm water) before and after meals and at bedtime. If you need dental work, please let the doctor know before you go for your appointment so that we can coordinate the best possible time for you in regards to your chemo regimen. You need to also let your dentist know that you are actively taking chemo. We may need to do labs prior to your dental appointment.  Skin Care Always use sunscreen that has not expired and with SPF (Sun Protection Factor) of 50 or higher. Wear hats to protect your head from the sun. Remember to use sunscreen on your hands,  ears,  face, & feet.  Use good moisturizing lotions such as udder cream, eucerin, or even Vaseline. Some chemotherapies can cause dry skin, color changes in your skin and nails.    Avoid long, hot showers or baths. Use gentle, fragrance-free soaps and laundry detergent. Use moisturizers, preferably creams or ointments rather than lotions because the thicker consistency is better at preventing skin dehydration. Apply the cream or ointment within 15 minutes of showering. Reapply moisturizer at night, and moisturize your hands every time after you wash them.  Hair Loss (if your doctor says your hair will fall out)  If your doctor says that your hair is likely to fall out, decide before you begin chemo whether you want to wear a wig. You may want to shop before treatment to match your hair color. Hats, turbans, and scarves can also camouflage hair loss, although some people prefer to leave their heads uncovered. If you go bare-headed outdoors, be sure to use sunscreen on your scalp. Cut your hair short. It eases the inconvenience of shedding lots of hair, but it also can reduce the emotional impact of watching your hair fall out. Don't perm or color your hair during chemotherapy. Those chemical treatments are already damaging to hair and can enhance hair loss. Once your chemo treatments are done and your hair has grown back, it's OK to resume dyeing or perming hair.  With chemotherapy, hair loss is almost always temporary. But when it grows back, it may be a different color or texture. In older adults who still had hair color before chemotherapy, the new growth may be completely gray.  Often, new hair is very fine and soft.  Infection Prevention Please wash your hands for at least 30 seconds using warm soapy water. Handwashing is the #1 way to prevent the spread of germs. Stay away from sick people or people who are getting over a cold. If you develop respiratory systems such as green/yellow mucus production or  productive cough or persistent cough let us know and we will see if you need an antibiotic. It is a good idea to keep a pair of gloves on when going into grocery stores/[employer] to decrease your risk of coming into contact with germs on the carts, etc. Carry alcohol hand gel with you at all times and use it frequently if out in public. If your temperature reaches 100.5 or higher please call the clinic and let us know.  If it is after hours or on the weekend please go to the ER if your temperature is over 100.5.  Please have your own personal thermometer at home to use.    Sex and bodily fluids If you are going to have sex, a condom must be used to protect the person that isn't taking chemotherapy. Chemo can decrease your libido (sex drive). For a few days after chemotherapy, chemotherapy can be excreted through your bodily fluids.  When using the toilet please close the lid and flush the toilet twice.  Do this for a few day after you have had chemotherapy.   Effects of chemotherapy on your sex life Some changes are simple and won't last long. They won't affect your sex life permanently.  Sometimes you may feel: too tired not strong enough to be very active sick or sore  not in the mood anxious or low Your anxiety might not seem related to sex. For example, you may be worried about the cancer and how your treatment is going. Or you may be  worried about money, or about how you family are coping with your illness.  These things can cause stress, which can affect your interest in sex. It's important to talk to your partner about how you feel.  Remember - the changes to your sex life don't usually last long. There's usually no medical reason to stop having sex during chemo. The drugs won't have any long term physical effects on your performance or enjoyment of sex. Cancer can't be passed on to your partner during sex  Contraception It's important to use reliable contraception during treatment. Avoid  getting pregnant while you or your partner are having chemotherapy. This is because the drugs may harm the baby. Sometimes chemotherapy drugs can leave a man or woman infertile.  This means you would not be able to have children in the future. You might want to talk to someone about permanent infertility. It can be very difficult to learn that you may no longer be able to have children. Some people find counselling helpful. There might be ways to preserve your fertility, although this is easier for men than for women. You may want to speak to a fertility expert. You can talk about sperm banking or harvesting your eggs. You can also ask about other fertility options, such as donor eggs. If you have or have had breast cancer, your doctor might advise you not to take the contraceptive pill. This is because the hormones in it might affect the cancer. It is not known for sure whether or not chemotherapy drugs can be passed on through semen or secretions from the vagina. Because of this some doctors advise people to use a barrier method if you have sex during treatment. This applies to vaginal, anal or oral sex. Generally, doctors advise a barrier method only for the time you are actually having the treatment and for about a week after your treatment. Advice like this can be worrying, but this does not mean that you have to avoid being intimate with your partner. You can still have close contact with your partner and continue to enjoy sex.  Animals If you have cats or birds we just ask that you not change the litter or change the cage.  Please have someone else do this for you while you are on chemotherapy.   Food Safety During and After Cancer Treatment Food safety is important for people both during and after cancer treatment. Cancer and cancer treatments, such as chemotherapy, radiation therapy, and stem cell/bone marrow transplantation, often weaken the immune system. This makes it harder for your body to  protect itself from foodborne illness, also called food poisoning. Foodborne illness is caused by eating food that contains harmful bacteria, parasites, or viruses.  Foods to avoid Some foods have a higher risk of becoming tainted with bacteria. These include: Unwashed fresh fruit and vegetables, especially leafy vegetables that can hide dirt and other contaminants Raw sprouts, such as alfalfa sprouts Raw or undercooked beef, especially ground beef, or other raw or undercooked meat and poultry Fatty, fried, or spicy foods immediately before or after treatment.  These can sit heavy on your stomach and make you feel nauseous. Raw or undercooked shellfish, such as oysters. Sushi and sashimi, which often contain raw fish.  Unpasteurized beverages, such as unpasteurized fruit juices, raw milk, raw yogurt, or cider Undercooked eggs, such as soft boiled, over easy, and poached; raw, unpasteurized eggs; or foods made with raw egg, such as homemade raw cookie dough and homemade mayonnaise  Simple steps for food safety  Shop smart. Do not buy food stored or displayed in an unclean area. Do not buy bruised or damaged fruits or vegetables. Do not buy cans that have cracks, dents, or bulges. Pick up foods that can spoil at the end of your shopping trip and store them in a cooler on the way home.  Prepare and clean up foods carefully. Rinse all fresh fruits and vegetables under running water, and dry them with a clean towel or paper towel. Clean the top of cans before opening them. After preparing food, wash your hands for 20 seconds with hot water and soap. Pay special attention to areas between fingers and under nails. Clean your utensils and dishes with hot water and soap. Disinfect your kitchen and cutting boards using 1 teaspoon of liquid, unscented bleach mixed into 1 quart of water.    Dispose of old food. Eat canned and packaged food before its expiration date (the "use by" or "best before"  date). Consume refrigerated leftovers within 3 to 4 days. After that time, throw out the food. Even if the food does not smell or look spoiled, it still may be unsafe. Some bacteria, such as Listeria, can grow even on foods stored in the refrigerator if they are kept for too long.  Take precautions when eating out. At restaurants, avoid buffets and salad bars where food sits out for a long time and comes in contact with many people. Food can become contaminated when someone with a virus, often a norovirus, or another "bug" handles it. Put any leftover food in a "to-go" container yourself, rather than having the server do it. And, refrigerate leftovers as soon as you get home. Choose restaurants that are clean and that are willing to prepare your food as you order it cooked.   AT HOME MEDICATIONS:                                                                                                                                                                Compazine/Prochlorperazine '10mg'$  tablet. Take 1 tablet every 6 hours as needed for nausea/vomiting. (This can make you sleepy)   EMLA cream. Apply a quarter size amount to port site 1 hour prior to chemo. Do not rub in. Cover with plastic wrap.    Diarrhea Sheet   If you are having loose stools/diarrhea, please purchase Imodium and begin taking as outlined:  At the first sign of poorly formed or loose stools you should begin taking Imodium (loperamide) 2 mg capsules.  Take two tablets ('4mg'$ ) followed by one tablet ('2mg'$ ) every 2 hours - DO NOT EXCEED 8 tablets in 24 hours.  If it is bedtime and you are having loose stools, take 2 tablets at bedtime, then 2 tablets  every 4 hours until morning.   Always call the [facility] if you are having loose stools/diarrhea that you can't get under control.  Loose stools/diarrhea leads to dehydration (loss of water) in your body.  We have other options of trying to get the loose stools/diarrhea to stop but  you must let us know!   Constipation Sheet  Colace - 100 mg capsules - take 2 capsules daily.  If this doesn't help then you can increase to 2 capsules twice daily.  Please call if the above does not work for you. Do not go more than 2 days without a bowel movement.  It is very important that you do not become constipated.  It will make you feel sick to your stomach (nausea) and can cause abdominal pain and vomiting.  Nausea Sheet   Compazine/Prochlorperazine '10mg'$  tablet. Take 1 tablet every 6 hours as needed for nausea/vomiting (This can make you drowsy).  If you are having persistent nausea (nausea that does not stop) please call the [facility] and let us know the amount of nausea that you are experiencing.  If you begin to vomit, you need to call the [facility] and if it is the weekend and you have vomited more than one time and can't get it to stop-go to the Emergency Room.  Persistent nausea/vomiting can lead to dehydration (loss of fluid in your body) and will make you feel very weak and unwell. Ice chips, sips of clear liquids, foods that are at room temperature, crackers, and toast tend to be better tolerated.   SYMPTOMS TO REPORT AS SOON AS POSSIBLE AFTER TREATMENT:  FEVER GREATER THAN 100.4 F  CHILLS WITH OR WITHOUT FEVER  NAUSEA AND VOMITING THAT IS NOT CONTROLLED WITH YOUR NAUSEA MEDICATION  UNUSUAL SHORTNESS OF BREATH  UNUSUAL BRUISING OR BLEEDING  TENDERNESS IN MOUTH AND THROAT WITH OR WITHOUT   PRESENCE OF ULCERS  URINARY PROBLEMS  BOWEL PROBLEMS  UNUSUAL RASH      Wear comfortable clothing and clothing appropriate for easy access to any Portacath or PICC line. Let us know if there is anything that we can do to make your therapy better!    What to do if you need assistance after hours or on the weekends: CALL [phone].  HOLD on the line, do not hang up.  You will hear multiple messages but at the end you will be connected with a nurse triage line.   They will contact the doctor if necessary.  Most of the time they will be able to assist you.  Do not call the hospital operator.      I have been informed and understand all of the instructions given to me and have received a copy. I have been instructed to call the clinic [phone] or my family physician as soon as possible for continued medical care, if indicated. I do not have any more questions at this time but understand that I may call the [facility] or the Patient Navigator at [phone] during office hours should I have questions or need assistance in obtaining follow-up care.

## 2021-09-15 NOTE — Progress Notes (Signed)

## 2021-09-15 NOTE — Progress Notes (Signed)
Pharmacist Chemotherapy Monitoring - Initial Assessment    Anticipated start date: 09/16/21   The following has been reviewed per standard work regarding the patient's treatment regimen: The patient's diagnosis, treatment plan and drug doses, and organ/hematologic function Lab orders and baseline tests specific to treatment regimen  The treatment plan start date, drug sequencing, and pre-medications Prior authorization status  Patient's documented medication list, including drug-drug interaction screen and prescriptions for anti-emetics and supportive care specific to the treatment regimen The drug concentrations, fluid compatibility, administration routes, and timing of the medications to be used The patient's access for treatment and lifetime cumulative dose history, if applicable  The patient's medication allergies and previous infusion related reactions, if applicable   Changes made to treatment plan:  N/A  Follow up needed:  N/A   Wynona Neat, [facility], 09/15/2021  4:26 PM

## 2021-09-16 ENCOUNTER — Inpatient Hospital Stay: Payer: Medicare Other

## 2021-09-16 VITALS — BP 108/50 | HR 65 | Temp 98.4°F | Resp 17 | Wt 116.0 lb

## 2021-09-16 DIAGNOSIS — Z95828 Presence of other vascular implants and grafts: Secondary | ICD-10-CM

## 2021-09-16 DIAGNOSIS — Z5111 Encounter for antineoplastic chemotherapy: Secondary | ICD-10-CM | POA: Diagnosis not present

## 2021-09-16 DIAGNOSIS — C250 Malignant neoplasm of head of pancreas: Secondary | ICD-10-CM

## 2021-09-16 LAB — CBC WITH DIFFERENTIAL/PLATELET
Abs Immature Granulocytes: 0.01 10*3/uL (ref 0.00–0.07)
Basophils Absolute: 0 10*3/uL (ref 0.0–0.1)
Basophils Relative: 1 %
Eosinophils Absolute: 0.1 10*3/uL (ref 0.0–0.5)
Eosinophils Relative: 2 %
HCT: 38.2 % (ref 36.0–46.0)
Hemoglobin: 12.2 g/dL (ref 12.0–15.0)
Immature Granulocytes: 0 %
Lymphocytes Relative: 26 %
Lymphs Abs: 1 10*3/uL (ref 0.7–4.0)
MCH: 30.5 pg (ref 26.0–34.0)
MCHC: 31.9 g/dL (ref 30.0–36.0)
MCV: 95.5 fL (ref 80.0–100.0)
Monocytes Absolute: 0.4 10*3/uL (ref 0.1–1.0)
Monocytes Relative: 10 %
Neutro Abs: 2.4 10*3/uL (ref 1.7–7.7)
Neutrophils Relative %: 61 %
Platelets: 245 10*3/uL (ref 150–400)
RBC: 4 MIL/uL (ref 3.87–5.11)
RDW: 14.6 % (ref 11.5–15.5)
WBC: 3.9 10*3/uL — ABNORMAL LOW (ref 4.0–10.5)
nRBC: 0 % (ref 0.0–0.2)

## 2021-09-16 LAB — COMPREHENSIVE METABOLIC PANEL
ALT: 27 U/L (ref 0–44)
AST: 24 U/L (ref 15–41)
Albumin: 3.6 g/dL (ref 3.5–5.0)
Alkaline Phosphatase: 94 U/L (ref 38–126)
Anion gap: 7 (ref 5–15)
BUN: 22 mg/dL (ref 8–23)
CO2: 25 mmol/L (ref 22–32)
Calcium: 9.2 mg/dL (ref 8.9–10.3)
Chloride: 107 mmol/L (ref 98–111)
Creatinine, Ser: 0.87 mg/dL (ref 0.44–1.00)
GFR, Estimated: >60 mL/min (ref >60)
Glucose, Bld: 118 mg/dL — ABNORMAL HIGH (ref 70–99)
Potassium: 3.8 mmol/L (ref 3.5–5.1)
Sodium: 139 mmol/L (ref 135–145)
Total Bilirubin: 0.7 mg/dL (ref 0.3–1.2)
Total Protein: 7.1 g/dL (ref 6.5–8.1)

## 2021-09-16 LAB — MAGNESIUM: Magnesium: 2.3 mg/dL (ref 1.7–2.4)

## 2021-09-16 MED ORDER — OXALIPLATIN CHEMO INJECTION 100 MG/20ML
66.0000 mg/m2 | Freq: Once | INTRAVENOUS | Status: AC
  Administered 2021-09-16: 100 mg via INTRAVENOUS
  Filled 2021-09-16: qty 20

## 2021-09-16 MED ORDER — DEXTROSE 5 % IV SOLN: Freq: Once | INTRAVENOUS | Status: AC

## 2021-09-16 MED ORDER — SODIUM CHLORIDE 0.9 % IV SOLN
150.0000 mg | Freq: Once | INTRAVENOUS | Status: AC
  Administered 2021-09-16: 150 mg via INTRAVENOUS
  Filled 2021-09-16: qty 150

## 2021-09-16 MED ORDER — SODIUM CHLORIDE 0.9 % IV SOLN
1920.0000 mg/m2 | INTRAVENOUS | Status: DC
  Administered 2021-09-16: 2950 mg via INTRAVENOUS
  Filled 2021-09-16: qty 59

## 2021-09-16 MED ORDER — LEUCOVORIN CALCIUM INJECTION 350 MG
320.0000 mg/m2 | Freq: Once | INTRAVENOUS | Status: AC
  Administered 2021-09-16: 492 mg via INTRAVENOUS
  Filled 2021-09-16: qty 24.6

## 2021-09-16 MED ORDER — SODIUM CHLORIDE 0.9 % IV SOLN
320.0000 mg/m2 | Freq: Once | INTRAVENOUS | Status: DC
  Filled 2021-09-16: qty 24.6

## 2021-09-16 MED ORDER — ATROPINE SULFATE 1 MG/ML IV SOLN
0.5000 mg | Freq: Once | INTRAVENOUS | Status: AC | PRN
  Administered 2021-09-16: 0.5 mg via INTRAVENOUS
  Filled 2021-09-16: qty 1

## 2021-09-16 MED ORDER — SODIUM CHLORIDE 0.9 % IV SOLN
10.0000 mg | Freq: Once | INTRAVENOUS | Status: AC
  Administered 2021-09-16: 10 mg via INTRAVENOUS
  Filled 2021-09-16: qty 10

## 2021-09-16 MED ORDER — PALONOSETRON HCL INJECTION 0.25 MG/5ML
0.2500 mg | Freq: Once | INTRAVENOUS | Status: AC
  Administered 2021-09-16: 0.25 mg via INTRAVENOUS
  Filled 2021-09-16: qty 5

## 2021-09-16 NOTE — Patient Instructions (Signed)
[facility]  Discharge Instructions: Thank you for choosing [facility] to provide your oncology and hematology care.  If you have a lab appointment with the [facility], please come in thru the Main Entrance and check in at the main information desk.  Wear comfortable clothing and clothing appropriate for easy access to any Portacath or PICC line.   We strive to give you quality time with your provider. You may need to reschedule your appointment if you arrive late (15 or more minutes).  Arriving late affects you and other patients whose appointments are after yours.  Also, if you miss three or more appointments without notifying the office, you may be dismissed from the clinic at the provider's discretion.      For prescription refill requests, have your pharmacy contact our office and allow 72 hours for refills to be completed.    Today you received the following chemotherapy and/or immunotherapy agents 5FU.  Oxaliplatin Injection What is this medication? OXALIPLATIN (ox AL i PLA tin) treats some types of cancer. It works by slowing down the growth of cancer cells. This medicine may be used for other purposes; ask your health care provider or pharmacist if you have questions. COMMON BRAND NAME(S): Eloxatin What should I tell my care team before I take this medication? They need to know if you have any of these conditions: Heart disease History of irregular heartbeat or rhythm Liver disease Low blood cell levels (white cells, red cells, and platelets) Lung or breathing disease, such as asthma Take medications that treat or prevent blood clots Tingling of the fingers, toes, or other nerve disorder An unusual or allergic reaction to oxaliplatin, other medications, foods, dyes, or preservatives If you or your partner are pregnant or trying to get pregnant Breast-feeding How should I use this medication? This medication is injected into a vein. It is  given by your care team in a hospital or clinic setting. Talk to your care team about the use of this medication in children. Special care may be needed. Overdosage: If you think you have taken too much of this medicine contact a poison control center or emergency room at once. NOTE: This medicine is only for you. Do not share this medicine with others. What if I miss a dose? Keep appointments for follow-up doses. It is important not to miss a dose. Call your care team if you are unable to keep an appointment. What may interact with this medication? Do not take this medication with any of the following: Cisapride Dronedarone Pimozide Thioridazine This medication may also interact with the following: Aspirin and aspirin-like medications Certain medications that treat or prevent blood clots, such as warfarin, apixaban, dabigatran, and rivaroxaban Cisplatin Cyclosporine Diuretics Medications for infection, such as acyclovir, adefovir, amphotericin B, bacitracin, cidofovir, foscarnet, ganciclovir, gentamicin, pentamidine, vancomycin NSAIDs, medications for pain and inflammation, such as ibuprofen or naproxen Other medications that cause heart rhythm changes Pamidronate Zoledronic acid This list may not describe all possible interactions. Give your health care provider a list of all the medicines, herbs, non-prescription drugs, or dietary supplements you use. Also tell them if you smoke, drink alcohol, or use illegal drugs. Some items may interact with your medicine. What should I watch for while using this medication? Your condition will be monitored carefully while you are receiving this medication. You may need blood work while taking this medication. This medication may make you feel generally unwell. This is not uncommon as chemotherapy can  affect healthy cells as well as cancer cells. Report any side effects. Continue your course of treatment even though you feel ill unless your care team  tells you to stop. This medication may increase your risk of getting an infection. Call your care team for advice if you get a fever, chills, sore throat, or other symptoms of a cold or flu. Do not treat yourself. Try to avoid being around people who are sick. Avoid taking medications that contain aspirin, acetaminophen, ibuprofen, naproxen, or ketoprofen unless instructed by your care team. These medications may hide a fever. Be careful brushing or flossing your teeth or using a toothpick because you may get an infection or bleed more easily. If you have any dental work done, tell your dentist you are receiving this medication. This medication can make you more sensitive to cold. Do not drink cold drinks or use ice. Cover exposed skin before coming in contact with cold temperatures or cold objects. When out in cold weather wear warm clothing and cover your mouth and nose to warm the air that goes into your lungs. Tell your care team if you get sensitive to the cold. Talk to your care team if you or your partner are pregnant or think either of you might be pregnant. This medication can cause serious birth defects if taken during pregnancy and for 9 months after the last dose. A negative pregnancy test is required before starting this medication. A reliable form of contraception is recommended while taking this medication and for 9 months after the last dose. Talk to your care team about effective forms of contraception. Do not father a child while taking this medication and for 6 months after the last dose. Use a condom while having sex during this time period. Do not breastfeed while taking this medication and for 3 months after the last dose. This medication may cause infertility. Talk to your care team if you are concerned about your fertility. What side effects may I notice from receiving this medication? Side effects that you should report to your care team as soon as possible: Allergic  reactions--skin rash, itching, hives, swelling of the face, lips, tongue, or throat Bleeding--bloody or black, tar-like stools, vomiting blood or Federico Maiorino material that looks like coffee grounds, red or dark Nobuo Nunziata urine, small red or purple spots on skin, unusual bruising or bleeding Dry cough, shortness of breath or trouble breathing Heart rhythm changes--fast or irregular heartbeat, dizziness, feeling faint or lightheaded, chest pain, trouble breathing Infection--fever, chills, cough, sore throat, wounds that don't heal, pain or trouble when passing urine, general feeling of discomfort or being unwell Liver injury--right upper belly pain, loss of appetite, nausea, light-colored stool, dark yellow or Tasheka Houseman urine, yellowing skin or eyes, unusual weakness or fatigue Low red blood cell level--unusual weakness or fatigue, dizziness, headache, trouble breathing Muscle injury--unusual weakness or fatigue, muscle pain, dark yellow or Eddis Pingleton urine, decrease in amount of urine Pain, tingling, or numbness in the hands or feet Sudden and severe headache, confusion, change in vision, seizures, which may be signs of posterior reversible encephalopathy syndrome (PRES) Unusual bruising or bleeding Side effects that usually do not require medical attention (report to your care team if they continue or are bothersome): Diarrhea Nausea Pain, redness, or swelling with sores inside the mouth or throat Unusual weakness or fatigue Vomiting This list may not describe all possible side effects. Call your doctor for medical advice about side effects. You may report side effects to FDA at 1-800-FDA-1088.  Where should I keep my medication? This medication is given in a hospital or clinic. It will not be stored at home. NOTE: This sheet is a summary. It may not cover all possible information. If you have questions about this medicine, talk to your doctor, pharmacist, or health care provider.  2023 Elsevier/Gold Standard  (2021-05-08 00:00:00)   Leucovorin Injection What is this medication? LEUCOVORIN (loo koe VOR in) prevents side effects from certain medications, such as methotrexate. It works by increasing folate levels. This helps protect healthy cells in your body. It may also be used to treat anemia caused by low levels of folate. It can also be used with fluorouracil, a type of chemotherapy, to treat colorectal cancer. It works by increasing the effects of fluorouracil in the body. This medicine may be used for other purposes; ask your health care provider or pharmacist if you have questions. What should I tell my care team before I take this medication? They need to know if you have any of these conditions: Anemia from low levels of vitamin B12 in the blood An unusual or allergic reaction to leucovorin, folic acid, other medications, foods, dyes, or preservatives Pregnant or trying to get pregnant Breastfeeding How should I use this medication? This medication is injected into a vein or a muscle. It is given by your care team in a hospital or clinic setting. Talk to your care team about the use of this medication in children. Special care may be needed. Overdosage: If you think you have taken too much of this medicine contact a poison control center or emergency room at once. NOTE: This medicine is only for you. Do not share this medicine with others. What if I miss a dose? Keep appointments for follow-up doses. It is important not to miss your dose. Call your care team if you are unable to keep an appointment. What may interact with this medication? Capecitabine Fluorouracil Phenobarbital Phenytoin Primidone Trimethoprim;sulfamethoxazole This list may not describe all possible interactions. Give your health care provider a list of all the medicines, herbs, non-prescription drugs, or dietary supplements you use. Also tell them if you smoke, drink alcohol, or use illegal drugs. Some items may  interact with your medicine. What should I watch for while using this medication? Your condition will be monitored carefully while you are receiving this medication. This medication may increase the side effects of 5-fluorouracil. Tell your care team if you have diarrhea or mouth sores that do not get better or that get worse. What side effects may I notice from receiving this medication? Side effects that you should report to your care team as soon as possible: Allergic reactions--skin rash, itching, hives, swelling of the face, lips, tongue, or throat This list may not describe all possible side effects. Call your doctor for medical advice about side effects. You may report side effects to FDA at 1-800-FDA-1088. Where should I keep my medication? This medication is given in a hospital or clinic. It will not be stored at home. NOTE: This sheet is a summary. It may not cover all possible information. If you have questions about this medicine, talk to your doctor, pharmacist, or health care provider.  2023 Elsevier/Gold Standard (2021-05-22 00:00:00)        To help prevent nausea and vomiting after your treatment, we encourage you to take your nausea medication as directed.  BELOW ARE SYMPTOMS THAT SHOULD BE REPORTED IMMEDIATELY: *FEVER GREATER THAN 100.4 F (38 C) OR HIGHER *CHILLS OR SWEATING *NAUSEA  AND VOMITING THAT IS NOT CONTROLLED WITH YOUR NAUSEA MEDICATION *UNUSUAL SHORTNESS OF BREATH *UNUSUAL BRUISING OR BLEEDING *URINARY PROBLEMS (pain or burning when urinating, or frequent urination) *BOWEL PROBLEMS (unusual diarrhea, constipation, pain near the anus) TENDERNESS IN MOUTH AND THROAT WITH OR WITHOUT PRESENCE OF ULCERS (sore throat, sores in mouth, or a toothache) UNUSUAL RASH, SWELLING OR PAIN  UNUSUAL VAGINAL DISCHARGE OR ITCHING   Items with * indicate a potential emergency and should be followed up as soon as possible or go to the Emergency Department if any problems should  occur.  Please show the CHEMOTHERAPY ALERT CARD or IMMUNOTHERAPY ALERT CARD at check-in to the Emergency Department and triage nurse.  Should you have questions after your visit or need to cancel or reschedule your appointment, please contact [facility] [phone]  and follow the prompts.  Office hours are 8:00 a.m. to 4:30 p.m. Monday - Friday. Please note that voicemails left after 4:00 p.m. may not be returned until the following business day.  We are closed weekends and major holidays. You have access to a nurse at all times for urgent questions. Please call the main number to the clinic [phone] and follow the prompts.  For any non-urgent questions, you may also contact your provider using MyChart. We now offer e-Visits for anyone 84 and older to request care online for non-urgent symptoms. For details visit [email or web address].   Also download the MyChart app! Go to the app store, search "MyChart", open the app, select [facility], and log in with your MyChart username and password.  Masks are optional in the cancer centers. If you would like for your care team to wear a mask while they are taking care of you, please let them know. You may have one support person who is at least 78 years old accompany you for your appointments.

## 2021-09-16 NOTE — Progress Notes (Signed)
Patient presents today for C1D1 of FOLFOX chemotherapy.  Patient is in satisfactory condition with only complaints of anxiety related to today's treatment.  Vital signs are stable.  Labs reviewed and all labs are within treatment parameters.  We will proceed with treatment per MD orders.  Patient tolerated treatment well with no complaints voiced.  5FU home infusion pump connected with no difficulty.  Patient education completed and patient verbalized understanding.  Patient left ambulatory in stable condition.  Vital signs stable at discharge.  Follow up as scheduled.

## 2021-09-17 ENCOUNTER — Encounter: Payer: Self-pay | Admitting: *Deleted

## 2021-09-17 ENCOUNTER — Other Ambulatory Visit: Payer: Self-pay

## 2021-09-17 ENCOUNTER — Inpatient Hospital Stay: Payer: Medicare Other | Admitting: Licensed Clinical Social Worker

## 2021-09-17 DIAGNOSIS — C250 Malignant neoplasm of head of pancreas: Secondary | ICD-10-CM

## 2021-09-17 NOTE — Progress Notes (Signed)
Approval received from [facility] for Lidocaine-Prilocaine Cream 01/25/21-12/16/21.

## 2021-09-17 NOTE — Progress Notes (Signed)
Sheryl Bender  Initial Assessment   Sheryl Bender is a 76 y.o. year old female contacted by phone. Clinical Social Bender was referred by medical provider for assessment of psychosocial needs.   SDOH (Social Determinants of Health) assessments performed: Yes   SDOH Screenings   Alcohol Screen: Not on file  Depression (JTT0-1): Not on file  Financial Resource Strain: Not on file  Food Insecurity: Not on file  Housing: Not on file  Physical Activity: Not on file  Social Connections: Not on file  Stress: Not on file  Tobacco Use: Low Risk  (09/15/2021)   Patient History    Smoking Tobacco Use: Never    Smokeless Tobacco Use: Never    Passive Exposure: Not on file  Transportation Needs: Not on file     Distress Screen completed: No     No data to display            Family/Social Information:  Housing Arrangement: patient lives alone Family members/support persons in your life? [name]) resides in [place] and is trying to accompany pt to all significant appointments.  Pt has another son who resides in [place] and is not able to provide support. Transportation concerns: Pt states she has a program through her health insurance that provides transportation benefits.  Pt has a number she calls to book and is then informed which transportation company will be taking the trip.  Employment: Retired Pt worked in an office prior to retiring.  Income source: Paediatric nurse concerns: No Type of concern: None Food access concerns: no Religious or spiritual practice: Editor, commissioning Currently in place:  Pt follows w/ a psychiatrist for a history of anxiety and depression for which she has taken medication for a number of years.  Pt was seeing a counselor, but did not find it to be beneficial and has felt stable on her current medication regiment.    Coping/ Adjustment to diagnosis: Patient understands treatment plan and what happens next?  yes Concerns about diagnosis and/or treatment: Overwhelmed by information and How will I care for myself Patient reported stressors: Depression, Anxiety/ nervousness, and Adjusting to my illness Hopes and/or priorities: Pt's priority is to continue w/ treatment w/ the hope of positive results. Patient enjoys  not addressed Current coping skills/ strengths: Motivation for treatment/growth     SUMMARY: Current SDOH Barriers:  Pt reports being overwhelmed by information and the follow up she needs to schedule.  Pt has been trying to write everything down as she also experiences some "brain fog" at baseline w/ her anxiety medication.  Clinical Social Bender Clinical Goal(s):  No clinical social Bender goals at this time  Interventions: Discussed common feeling and emotions when being diagnosed with cancer, and the importance of support during treatment Informed patient of the support team roles and support services at [facility] Provided [facility] contact information and encouraged patient to call with any questions or concerns Provided pt w/ contact information for [place] for additional support closer to home.   Follow Up Plan: Patient will contact CSW with any support or resource needs and CSW will follow-up with patient by phone  Patient verbalizes understanding of plan: Yes    Sheryl Combs, LCSW

## 2021-09-18 ENCOUNTER — Inpatient Hospital Stay: Payer: Medicare Other

## 2021-09-18 VITALS — BP 111/58 | HR 64 | Temp 97.7°F | Resp 18

## 2021-09-18 DIAGNOSIS — Z5111 Encounter for antineoplastic chemotherapy: Secondary | ICD-10-CM | POA: Diagnosis not present

## 2021-09-18 DIAGNOSIS — C250 Malignant neoplasm of head of pancreas: Secondary | ICD-10-CM

## 2021-09-18 DIAGNOSIS — Z95828 Presence of other vascular implants and grafts: Secondary | ICD-10-CM

## 2021-09-18 MED ORDER — SODIUM CHLORIDE 0.9% FLUSH
10.0000 mL | INTRAVENOUS | Status: DC | PRN
  Administered 2021-09-18: 10 mL

## 2021-09-18 MED ORDER — HEPARIN SOD (PORK) LOCK FLUSH 100 UNIT/ML IV SOLN
500.0000 [IU] | Freq: Once | INTRAVENOUS | Status: AC | PRN
  Administered 2021-09-18: 500 [IU]

## 2021-09-18 NOTE — Progress Notes (Signed)
Sheryl Bender presents to have home infusion pump d/c'd and for port-a-cath deaccess with flush.  Portacath located right chest wall accessed with  H 20 needle.  Good blood return present. Portacath flushed with NS and 500U/56m Heparin, and needle removed intact.  Procedure tolerated well and without incident.

## 2021-09-18 NOTE — Patient Instructions (Signed)
[facility]  Discharge Instructions: Thank you for choosing [facility] to provide your oncology and hematology care.  If you have a lab appointment with the [facility], please come in thru the Main Entrance and check in at the main information desk.  Wear comfortable clothing and clothing appropriate for easy access to any Portacath or PICC line.   We strive to give you quality time with your provider. You may need to reschedule your appointment if you arrive late (15 or more minutes).  Arriving late affects you and other patients whose appointments are after yours.  Also, if you miss three or more appointments without notifying the office, you may be dismissed from the clinic at the provider's discretion.      For prescription refill requests, have your pharmacy contact our office and allow 72 hours for refills to be completed.    Pump d/c done today.       To help prevent nausea and vomiting after your treatment, we encourage you to take your nausea medication as directed.  BELOW ARE SYMPTOMS THAT SHOULD BE REPORTED IMMEDIATELY: *FEVER GREATER THAN 100.4 F (38 C) OR HIGHER *CHILLS OR SWEATING *NAUSEA AND VOMITING THAT IS NOT CONTROLLED WITH YOUR NAUSEA MEDICATION *UNUSUAL SHORTNESS OF BREATH *UNUSUAL BRUISING OR BLEEDING *URINARY PROBLEMS (pain or burning when urinating, or frequent urination) *BOWEL PROBLEMS (unusual diarrhea, constipation, pain near the anus) TENDERNESS IN MOUTH AND THROAT WITH OR WITHOUT PRESENCE OF ULCERS (sore throat, sores in mouth, or a toothache) UNUSUAL RASH, SWELLING OR PAIN  UNUSUAL VAGINAL DISCHARGE OR ITCHING   Items with * indicate a potential emergency and should be followed up as soon as possible or go to the Emergency Department if any problems should occur.  Please show the CHEMOTHERAPY ALERT CARD or IMMUNOTHERAPY ALERT CARD at check-in to the Emergency Department and triage nurse.  Should you have questions  after your visit or need to cancel or reschedule your appointment, please contact [facility] [phone]  and follow the prompts.  Office hours are 8:00 a.m. to 4:30 p.m. Monday - Friday. Please note that voicemails left after 4:00 p.m. may not be returned until the following business day.  We are closed weekends and major holidays. You have access to a nurse at all times for urgent questions. Please call the main number to the clinic [phone] and follow the prompts.  For any non-urgent questions, you may also contact your provider using MyChart. We now offer e-Visits for anyone 7 and older to request care online for non-urgent symptoms. For details visit [email or web address].   Also download the MyChart app! Go to the app store, search "MyChart", open the app, select [facility], and log in with your MyChart username and password.  Masks are optional in the cancer centers. If you would like for your care team to wear a mask while they are taking care of you, please let them know. You may have one support person who is at least 78 years old accompany you for your appointments.

## 2021-09-23 ENCOUNTER — Telehealth: Payer: Self-pay | Admitting: Licensed Clinical Social Worker

## 2021-09-23 ENCOUNTER — Telehealth: Payer: Medicare Other | Admitting: Licensed Clinical Social Worker

## 2021-09-23 ENCOUNTER — Ambulatory Visit: Payer: Self-pay | Admitting: Licensed Clinical Social Worker

## 2021-09-23 DIAGNOSIS — C250 Malignant neoplasm of head of pancreas: Secondary | ICD-10-CM

## 2021-09-23 DIAGNOSIS — Z1379 Encounter for other screening for genetic and chromosomal anomalies: Secondary | ICD-10-CM

## 2021-09-23 NOTE — Telephone Encounter (Signed)
I contacted [name] to discuss her genetic testing results. 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 She does not have this condition herself. No other pathogenic variants were identified in the 77 genes analyzed. Detailed clinic note to follow.   The test report has been scanned into EPIC and is located under the Molecular Pathology section of the Results Review tab.  A portion of the result report is included below for reference.      Faith Rogue, MS, [facility] Genetic Counselor [email or web address] Phone: [phone]

## 2021-09-23 NOTE — Progress Notes (Signed)
REFERRING PROVIDER: Dr. Delton Coombes  PRIMARY PROVIDER:  Sherrilee Gilles, DO  PRIMARY REASON FOR VISIT:  1. 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 Genetic testing      HISTORY OF PRESENT ILLNESS:   Sheryl Bender, a 76 y.o. female, was seen for a  cancer genetics consultation at the request of Dr. Delton Coombes due to a personal history of pancreatic cancer.  Sheryl Bender presents to clinic today to discuss the possibility of a hereditary predisposition to cancer, genetic testing, and to further clarify her future cancer risks, as well as potential cancer risks for family members.   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 VUS in ATM called c.4367G>A and in [facility] called c.238G>A also identified. The remainder of testing was negative/normal. The report date is 09/20/2021.  The CancerNext-Expanded + RNAinsight gene panel offered by [employer] and includes sequencing and rearrangement analysis for the following 77 genes: IP, ALK, APC*, ATM*, AXIN2, BAP1, BARD1, BLM, BMPR1A, BRCA1*, BRCA2*, BRIP1*, CDC73, CDH1*,CDK4, CDKN1B, CDKN2A, CHEK2*, CTNNA1, DICER1, FANCC, FH, FLCN, GALNT12, KIF1B, LZTR1, MAX, MEN1, MET, MLH1*, MSH2*, MSH3, MSH6*, MUTYH*, NBN, NF1*, NF2, NTHL1, PALB2*, PHOX2B, PMS2*, POT1, PRKAR1A, PTCH1, PTEN*, RAD51C*, RAD51D*,RB1, RECQL, RET, SDHA, SDHAF2, SDHB, SDHC, SDHD, SMAD4, SMARCA4, SMARCB1, SMARCE1, STK11, SUFU, TMEM127, TP53*,TSC1, TSC2, VHL and XRCC2 (sequencing and deletion/duplication); EGFR, EGLN1,  HOXB13, KIT, MITF, PDGFRA, POLD1 and POLE (sequencing only); EPCAM and GREM1 (deletion/duplication only).     RISK FACTORS:  Ovaries intact: yes.  Hysterectomy: no.  Menopausal status: postmenopausal.  Colonoscopy: yes; normal.  Past Medical History:  Diagnosis Date   Anxiety    Arthritis    Depression    Dysrhythmia    hx palpitations greater than 5 yrs -neg echo, stress per pt ? where or dr   GERD (gastroesophageal reflux disease)    occ   Pancreatic adenocarcinoma ([place])    Pancreatic pseudocyst    Port-A-Cath in place 09/15/2021   Pulmonary embolism [facility])    September 2022    Past Surgical History:  Procedure Laterality Date   ANTERIOR LAT LUMBAR FUSION Right 12/16/2015   Procedure: RIGHT LUMBAR TWO-THREE, LUMBAR THREE-FOUR, LUMBAR FOUR-FIVE ANTEROLATERAL LUMBAR INTERBODY FUSION;  Surgeon: Erline Levine, MD;  Location: [place];  Service: Neurosurgery;  Laterality: Right;   BALLOON DILATION N/A 08/27/2021   Procedure: BALLOON DILATION;  Surgeon: Rush Landmark Telford Nab., MD;  Location: [facility];  Service: Gastroenterology;  Laterality: N/A;   BIOPSY  07/09/2021   Procedure: BIOPSY;  Surgeon: Rush Landmark Telford Nab., MD;  Location: [place];  Service: Gastroenterology;;   CYST GASTROSTOMY  08/27/2021   Procedure: CYST GASTROSTOMY;  Surgeon: Irving Copas., MD;  Location: [facility];  Service: Gastroenterology;;   ESOPHAGOGASTRODUODENOSCOPY N/A 07/09/2021   Procedure: ESOPHAGOGASTRODUODENOSCOPY (EGD);  Surgeon: Irving Copas., MD;  Location: [place];  Service: Gastroenterology;  Laterality: N/A;   ESOPHAGOGASTRODUODENOSCOPY (EGD) WITH PROPOFOL N/A 08/27/2021   Procedure: ESOPHAGOGASTRODUODENOSCOPY (EGD) WITH PROPOFOL;  Surgeon: Rush Landmark Telford Nab., MD;  Location: [facility];  Service: Gastroenterology;  Laterality: N/A;  EUS N/A 07/09/2021   Procedure: UPPER ENDOSCOPIC ULTRASOUND (EUS) RADIAL;  Surgeon: Irving Copas., MD;  Location: [place];  Service: Gastroenterology;  Laterality: N/A;   EUS N/A 08/27/2021   Procedure: UPPER ENDOSCOPIC ULTRASOUND (EUS) LINEAR;  Surgeon: Irving Copas., MD;  Location: [facility];  Service: Gastroenterology;  Laterality: N/A;   FINE NEEDLE ASPIRATION  07/09/2021   Procedure: FINE NEEDLE ASPIRATION (FNA) LINEAR;  Surgeon: Irving Copas., MD;  Location: [place];  Service: Gastroenterology;;   IR IMAGING GUIDED PORT INSERTION  09/14/2021   LUMBAR PERCUTANEOUS PEDICLE SCREW 3 LEVEL Bilateral 12/16/2015   Procedure: PERCUTANEOUS PEDICLE SCREWS BILATERALLY AT LUMBAR TWO-FIVE;  Surgeon: Erline Levine, MD;  Location: [place];  Service: Neurosurgery;  Laterality: Bilateral;   PANCREATIC STENT PLACEMENT  08/27/2021   Procedure: PANCREATIC STENT PLACEMENT;  Surgeon: Rush Landmark Telford Nab., MD;  Location: [facility];  Service: Gastroenterology;;   TUBAL LIGATION  1974    FAMILY HISTORY:  We obtained a detailed, 4-generation family history.  Significant diagnoses are listed below: Family History  Problem Relation Age of Onset   Pancreatitis Brother        alcoholic pancreatitis   Pancreatic cancer Neg Hx    Colon cancer Neg Hx    Sheryl Bender has 2 sons, 58 and 55, and she had 2 brothers. No cancers.  No known cancers in maternal or paternal relatives.  Sheryl Bender is unaware of previous family history of genetic testing for hereditary cancer risks. There is no reported Ashkenazi Jewish ancestry. There is no known consanguinity.    GENETIC COUNSELING ASSESSMENT: Sheryl Bender is a 76 y.o. female with a personal history of pancreatic cancer which is somewhat suggestive of a hereditary cancer syndrome and predisposition to cancer. We, therefore, discussed and recommended the following at today's visit.   DISCUSSION: We discussed that approximately 10% of pancreatic cancer is hereditary. Most cases of hereditary pancreatic cancer are associated with BRCA1/BRCA2  genes, although  there are other genes associated with hereditary  cancer as well. Cancers and risks are gene specific. We discussed that testing is beneficial for several reasons including knowing about cancer risks, identifying potential screening and risk-reduction options that may be appropriate, and to understand if other family members could be at risk for cancer and allow them to undergo genetic testing.   We reviewed the characteristics, features and inheritance patterns of hereditary cancer syndromes. We also discussed genetic testing, including the appropriate family members to test, the process of testing, insurance coverage and turn-around-time for results. We discussed the implications of a negative, positive and/or variant of uncertain significant result. Sheryl Bender had testing ordered earlier this month through the [facility] CancerNext-Expanded+RNA panel.   GENETIC TEST RESULTS:  The CancerNext-Expanded+RNA Panel found a single, pathogenic variant in MSH3 called c.1256C>G. 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 The remainder of testing was negative/normal.  The CancerNext-Expanded + RNAinsight gene panel offered by [employer] and includes sequencing and rearrangement analysis for the following 77 genes: IP, ALK, APC*, ATM*, AXIN2, BAP1, BARD1, BLM, BMPR1A, BRCA1*, BRCA2*, BRIP1*, CDC73, CDH1*,CDK4, CDKN1B, CDKN2A, CHEK2*, CTNNA1, DICER1, FANCC, FH, FLCN, GALNT12, KIF1B, LZTR1, MAX, MEN1, MET, MLH1*, MSH2*, MSH3, MSH6*, MUTYH*, NBN, NF1*, NF2, NTHL1, PALB2*, PHOX2B, PMS2*, POT1, PRKAR1A, PTCH1, PTEN*, RAD51C*, RAD51D*,RB1, RECQL, RET, SDHA, SDHAF2, SDHB, SDHC, SDHD, SMAD4, SMARCA4, SMARCB1, SMARCE1, STK11, SUFU, TMEM127, TP53*,TSC1, TSC2, VHL and XRCC2 (sequencing and deletion/duplication); EGFR, EGLN1,  HOXB13, KIT, MITF, PDGFRA, POLD1 and POLE (sequencing only); EPCAM and GREM1 (deletion/duplication only).  The test  report has been scanned into EPIC and is located under the Molecular Pathology section of the Results Review tab.  A portion of the result report is included below for reference. Genetic testing reported out on 09/20/2021.      Genetic testing identified a variant of uncertain significance (VUS) in the ATM and SDHC genes.  At this time, it is unknown if these variants are associated with an increased risk for cancer or if they are benign, but most uncertain variants are reclassified to benign. They should not be used to make medical management decisions. With time, we suspect the laboratory will determine the significance of these variants if any. If the laboratory reclassifies these variants, we will attempt to contact Sheryl Bender to discuss it further.   Even though a pathogenic variant was not identified, possible explanations for the cancer in the family may include: There may be no hereditary risk for cancer in the family. The cancers in Sheryl Bender and/or her family may be sporadic/familial or due to other genetic and environmental factors. There may be a gene mutation in one of these genes that current testing methods cannot detect but that chance is small. There could be another gene that has not yet been discovered, or that we have not yet tested, that is responsible for the cancer diagnoses in the family.  It is also possible there is a hereditary cause for the cancer in the family that Sheryl Bender did not inherit. The variant of uncertain significance detected in the ATM/SDHC gene may be reclassified as a pathogenic variant in the future. At this time, we do not know if this variant increases the risk for cancer.  Therefore, it is important to remain in touch with cancer genetics in the future so that we can continue to offer Sheryl Bender the most up to date genetic testing.   MSH3 People with one MSH3 mutation are carriers. People with two MSH3 mutations have an increased change to develop  colorectal polyps and colorectal cancer. This result shows Sheryl Bender does not have this increased risk since she only has one MSH3 mutation. There is insufficient evidence to suggest an increased cancer risk above that of the general population for people with only one MSH3 mutation Family members may be at risk to have inherited this mutation  ADDITIONAL GENETIC TESTING:  We discussed with Sheryl Bender that her genetic testing was fairly extensive.  If there are additional relevant genes identified to increase cancer risk that can be analyzed in the future, we would be happy to discuss and coordinate this testing at that time.     CANCER SCREENING RECOMMENDATIONS:  Sheryl Bender test result is considered negative (normal).  This means that we have not identified a hereditary cause for her personal history of cancer at this time.   An individual's cancer risk and medical management are not determined by genetic test results alone. Overall cancer risk assessment incorporates additional factors, including personal medical history, family history, and any available genetic information that may result in a personalized plan for cancer prevention and surveillance. Therefore, it is recommended she continue to follow the cancer management and screening guidelines provided by her oncology and primary healthcare provider.  RECOMMENDATIONS FOR FAMILY MEMBERS:   Relatives can have testing for the [facility] mutation. If her sons inherited the MSH3 mutation from her, and their father also carries an MSH3  mutation, they could be at risk to have MSH3-related cancer risks/increased chance for polyps. Since she did not inherit a identifiable mutation in a cancer predisposition gene included on this panel, her children could not have inherited a known mutation from her in one of these genes. Individuals in this family might be at some increased risk of developing cancer, over the general population risk, due to the family  history of cancer.  Individuals in the family should notify their providers of the family history of cancer. We recommend women in this family have a yearly mammogram beginning at age 47, or 69 years younger than the earliest onset of cancer, an annual clinical breast exam, and perform monthly breast self-exams.  Family members should have colonoscopies by at age 40, or earlier, as recommended by their providers. We do not recommend familial testing for the ATM and [place] variantsof uncertain significance (VUS).  FOLLOW-UP:  Lastly, we discussed with Sheryl Bender that cancer genetics is a rapidly advancing field and it is possible that new genetic tests will be appropriate for her and/or her family members in the future. We encouraged her to remain in contact with cancer genetics on an annual basis so we can update her personal and family histories and let her know of advances in cancer genetics that may benefit this family.   Our contact number was provided. Sheryl Bender questions were answered to her satisfaction, and she knows she is welcome to call us at anytime with additional questions or concerns.    Faith Rogue, MS, [facility] Genetic Counselor [email or web address] Phone: [phone]

## 2021-09-30 ENCOUNTER — Inpatient Hospital Stay: Payer: Medicare Other | Attending: Hematology

## 2021-09-30 ENCOUNTER — Inpatient Hospital Stay: Payer: Medicare Other

## 2021-09-30 ENCOUNTER — Inpatient Hospital Stay (HOSPITAL_BASED_OUTPATIENT_CLINIC_OR_DEPARTMENT_OTHER): Payer: Medicare Other | Admitting: Hematology

## 2021-09-30 VITALS — BP 104/61 | HR 79 | Temp 98.2°F | Resp 18

## 2021-09-30 DIAGNOSIS — C250 Malignant neoplasm of head of pancreas: Secondary | ICD-10-CM | POA: Diagnosis not present

## 2021-09-30 DIAGNOSIS — Z5111 Encounter for antineoplastic chemotherapy: Secondary | ICD-10-CM | POA: Insufficient documentation

## 2021-09-30 DIAGNOSIS — C253 Malignant neoplasm of pancreatic duct: Secondary | ICD-10-CM | POA: Diagnosis present

## 2021-09-30 DIAGNOSIS — Z95828 Presence of other vascular implants and grafts: Secondary | ICD-10-CM

## 2021-09-30 DIAGNOSIS — Z5189 Encounter for other specified aftercare: Secondary | ICD-10-CM | POA: Insufficient documentation

## 2021-09-30 DIAGNOSIS — D72819 Decreased white blood cell count, unspecified: Secondary | ICD-10-CM | POA: Diagnosis not present

## 2021-09-30 LAB — MAGNESIUM: Magnesium: 2.4 mg/dL (ref 1.7–2.4)

## 2021-09-30 LAB — CBC WITH DIFFERENTIAL/PLATELET
Abs Immature Granulocytes: 0.02 10*3/uL (ref 0.00–0.07)
Basophils Absolute: 0 10*3/uL (ref 0.0–0.1)
Basophils Relative: 0 %
Eosinophils Absolute: 0.1 10*3/uL (ref 0.0–0.5)
Eosinophils Relative: 2 %
HCT: 35.9 % — ABNORMAL LOW (ref 36.0–46.0)
Hemoglobin: 11.5 g/dL — ABNORMAL LOW (ref 12.0–15.0)
Immature Granulocytes: 1 %
Lymphocytes Relative: 33 %
Lymphs Abs: 1 10*3/uL (ref 0.7–4.0)
MCH: 30.7 pg (ref 26.0–34.0)
MCHC: 32 g/dL (ref 30.0–36.0)
MCV: 95.7 fL (ref 80.0–100.0)
Monocytes Absolute: 0.5 10*3/uL (ref 0.1–1.0)
Monocytes Relative: 15 %
Neutro Abs: 1.5 10*3/uL — ABNORMAL LOW (ref 1.7–7.7)
Neutrophils Relative %: 49 %
Platelets: 194 10*3/uL (ref 150–400)
RBC: 3.75 MIL/uL — ABNORMAL LOW (ref 3.87–5.11)
RDW: 15.2 % (ref 11.5–15.5)
WBC: 3.1 10*3/uL — ABNORMAL LOW (ref 4.0–10.5)
nRBC: 0 % (ref 0.0–0.2)

## 2021-09-30 LAB — COMPREHENSIVE METABOLIC PANEL
ALT: 25 U/L (ref 0–44)
AST: 22 U/L (ref 15–41)
Albumin: 3.5 g/dL (ref 3.5–5.0)
Alkaline Phosphatase: 91 U/L (ref 38–126)
Anion gap: 5 (ref 5–15)
BUN: 25 mg/dL — ABNORMAL HIGH (ref 8–23)
CO2: 28 mmol/L (ref 22–32)
Calcium: 8.9 mg/dL (ref 8.9–10.3)
Chloride: 106 mmol/L (ref 98–111)
Creatinine, Ser: 0.87 mg/dL (ref 0.44–1.00)
GFR, Estimated: >60 mL/min (ref >60)
Glucose, Bld: 128 mg/dL — ABNORMAL HIGH (ref 70–99)
Potassium: 3.8 mmol/L (ref 3.5–5.1)
Sodium: 139 mmol/L (ref 135–145)
Total Bilirubin: 0.5 mg/dL (ref 0.3–1.2)
Total Protein: 6.5 g/dL (ref 6.5–8.1)

## 2021-09-30 MED ORDER — SODIUM CHLORIDE 0.9 % IV SOLN: INTRAVENOUS | Status: DC

## 2021-09-30 MED ORDER — DEXTROSE 5 % IV SOLN: Freq: Once | INTRAVENOUS | Status: AC

## 2021-09-30 MED ORDER — PALONOSETRON HCL INJECTION 0.25 MG/5ML
0.2500 mg | Freq: Once | INTRAVENOUS | Status: AC
  Administered 2021-09-30: 0.25 mg via INTRAVENOUS
  Filled 2021-09-30: qty 5

## 2021-09-30 MED ORDER — OXALIPLATIN CHEMO INJECTION 100 MG/20ML
66.0000 mg/m2 | Freq: Once | INTRAVENOUS | Status: AC
  Administered 2021-09-30: 100 mg via INTRAVENOUS
  Filled 2021-09-30: qty 20

## 2021-09-30 MED ORDER — SODIUM CHLORIDE 0.9 % IV SOLN
1920.0000 mg/m2 | INTRAVENOUS | Status: DC
  Administered 2021-09-30: 2950 mg via INTRAVENOUS
  Filled 2021-09-30: qty 59

## 2021-09-30 MED ORDER — SODIUM CHLORIDE 0.9 % IV SOLN
150.0000 mg | Freq: Once | INTRAVENOUS | Status: AC
  Administered 2021-09-30: 150 mg via INTRAVENOUS
  Filled 2021-09-30: qty 150

## 2021-09-30 MED ORDER — SODIUM CHLORIDE 0.9 % IV SOLN
10.0000 mg | Freq: Once | INTRAVENOUS | Status: AC
  Administered 2021-09-30: 10 mg via INTRAVENOUS
  Filled 2021-09-30: qty 10

## 2021-09-30 MED ORDER — SODIUM CHLORIDE 0.9 % IV SOLN
320.0000 mg/m2 | Freq: Once | INTRAVENOUS | Status: AC
  Administered 2021-09-30: 492 mg via INTRAVENOUS
  Filled 2021-09-30: qty 24.6

## 2021-09-30 MED ORDER — SODIUM CHLORIDE 0.9 % IV SOLN
120.0000 mg/m2 | Freq: Once | INTRAVENOUS | Status: AC
  Administered 2021-09-30: 180 mg via INTRAVENOUS
  Filled 2021-09-30: qty 5

## 2021-09-30 MED ORDER — ATROPINE SULFATE 1 MG/ML IV SOLN
0.5000 mg | Freq: Once | INTRAVENOUS | Status: AC
  Administered 2021-09-30: 0.5 mg via INTRAVENOUS
  Filled 2021-09-30: qty 1

## 2021-09-30 NOTE — Patient Instructions (Signed)
[facility]  Discharge Instructions: Thank you for choosing [facility] to provide your oncology and hematology care.  If you have a lab appointment with the [facility], please come in thru the Main Entrance and check in at the main information desk.  Wear comfortable clothing and clothing appropriate for easy access to any Portacath or PICC line.   We strive to give you quality time with your provider. You may need to reschedule your appointment if you arrive late (15 or more minutes).  Arriving late affects you and other patients whose appointments are after yours.  Also, if you miss three or more appointments without notifying the office, you may be dismissed from the clinic at the provider's discretion.      For prescription refill requests, have your pharmacy contact our office and allow 72 hours for refills to be completed.    Today you received the following chemotherapy and/or immunotherapy agents Folfirinox   To help prevent nausea and vomiting after your treatment, we encourage you to take your nausea medication as directed.  BELOW ARE SYMPTOMS THAT SHOULD BE REPORTED IMMEDIATELY: *FEVER GREATER THAN 100.4 F (38 C) OR HIGHER *CHILLS OR SWEATING *NAUSEA AND VOMITING THAT IS NOT CONTROLLED WITH YOUR NAUSEA MEDICATION *UNUSUAL SHORTNESS OF BREATH *UNUSUAL BRUISING OR BLEEDING *URINARY PROBLEMS (pain or burning when urinating, or frequent urination) *BOWEL PROBLEMS (unusual diarrhea, constipation, pain near the anus) TENDERNESS IN MOUTH AND THROAT WITH OR WITHOUT PRESENCE OF ULCERS (sore throat, sores in mouth, or a toothache) UNUSUAL RASH, SWELLING OR PAIN  UNUSUAL VAGINAL DISCHARGE OR ITCHING   Items with * indicate a potential emergency and should be followed up as soon as possible or go to the Emergency Department if any problems should occur.  Please show the CHEMOTHERAPY ALERT CARD or IMMUNOTHERAPY ALERT CARD at check-in to the Emergency  Department and triage nurse.  Should you have questions after your visit or need to cancel or reschedule your appointment, please contact [facility] [phone]  and follow the prompts.  Office hours are 8:00 a.m. to 4:30 p.m. Monday - Friday. Please note that voicemails left after 4:00 p.m. may not be returned until the following business day.  We are closed weekends and major holidays. You have access to a nurse at all times for urgent questions. Please call the main number to the clinic [phone] and follow the prompts.  For any non-urgent questions, you may also contact your provider using MyChart. We now offer e-Visits for anyone 38 and older to request care online for non-urgent symptoms. For details visit [email or web address].   Also download the MyChart app! Go to the app store, search "MyChart", open the app, select , and log in with your MyChart username and password.  Masks are optional in the cancer centers. If you would like for your care team to wear a mask while they are taking care of you, please let them know. You may have one support person who is at least 78 years old accompany you for your appointments.  Oxaliplatin Injection What is this medication? OXALIPLATIN (ox AL i PLA tin) treats some types of cancer. It works by slowing down the growth of cancer cells. This medicine may be used for other purposes; ask your health care provider or pharmacist if you have questions. COMMON BRAND NAME(S): Eloxatin What should I tell my care team before I take this medication? They need to know if you have any of  these conditions: Heart disease History of irregular heartbeat or rhythm Liver disease Low blood cell levels (white cells, red cells, and platelets) Lung or breathing disease, such as asthma Take medications that treat or prevent blood clots Tingling of the fingers, toes, or other nerve disorder An unusual or allergic reaction to  oxaliplatin, other medications, foods, dyes, or preservatives If you or your partner are pregnant or trying to get pregnant Breast-feeding How should I use this medication? This medication is injected into a vein. It is given by your care team in a hospital or clinic setting. Talk to your care team about the use of this medication in children. Special care may be needed. Overdosage: If you think you have taken too much of this medicine contact a poison control center or emergency room at once. NOTE: This medicine is only for you. Do not share this medicine with others. What if I miss a dose? Keep appointments for follow-up doses. It is important not to miss a dose. Call your care team if you are unable to keep an appointment. What may interact with this medication? Do not take this medication with any of the following: Cisapride Dronedarone Pimozide Thioridazine This medication may also interact with the following: Aspirin and aspirin-like medications Certain medications that treat or prevent blood clots, such as warfarin, apixaban, dabigatran, and rivaroxaban Cisplatin Cyclosporine Diuretics Medications for infection, such as acyclovir, adefovir, amphotericin B, bacitracin, cidofovir, foscarnet, ganciclovir, gentamicin, pentamidine, vancomycin NSAIDs, medications for pain and inflammation, such as ibuprofen or naproxen Other medications that cause heart rhythm changes Pamidronate Zoledronic acid This list may not describe all possible interactions. Give your health care provider a list of all the medicines, herbs, non-prescription drugs, or dietary supplements you use. Also tell them if you smoke, drink alcohol, or use illegal drugs. Some items may interact with your medicine. What should I watch for while using this medication? Your condition will be monitored carefully while you are receiving this medication. You may need blood work while taking this medication. This medication may  make you feel generally unwell. This is not uncommon as chemotherapy can affect healthy cells as well as cancer cells. Report any side effects. Continue your course of treatment even though you feel ill unless your care team tells you to stop. This medication may increase your risk of getting an infection. Call your care team for advice if you get a fever, chills, sore throat, or other symptoms of a cold or flu. Do not treat yourself. Try to avoid being around people who are sick. Avoid taking medications that contain aspirin, acetaminophen, ibuprofen, naproxen, or ketoprofen unless instructed by your care team. These medications may hide a fever. Be careful brushing or flossing your teeth or using a toothpick because you may get an infection or bleed more easily. If you have any dental work done, tell your dentist you are receiving this medication. This medication can make you more sensitive to cold. Do not drink cold drinks or use ice. Cover exposed skin before coming in contact with cold temperatures or cold objects. When out in cold weather wear warm clothing and cover your mouth and nose to warm the air that goes into your lungs. Tell your care team if you get sensitive to the cold. Talk to your care team if you or your partner are pregnant or think either of you might be pregnant. This medication can cause serious birth defects if taken during pregnancy and for 9 months after the last dose.  A negative pregnancy test is required before starting this medication. A reliable form of contraception is recommended while taking this medication and for 9 months after the last dose. Talk to your care team about effective forms of contraception. Do not father a child while taking this medication and for 6 months after the last dose. Use a condom while having sex during this time period. Do not breastfeed while taking this medication and for 3 months after the last dose. This medication may cause infertility. Talk  to your care team if you are concerned about your fertility. What side effects may I notice from receiving this medication? Side effects that you should report to your care team as soon as possible: Allergic reactions--skin rash, itching, hives, swelling of the face, lips, tongue, or throat Bleeding--bloody or black, tar-like stools, vomiting blood or brown material that looks like coffee grounds, red or dark brown urine, small red or purple spots on skin, unusual bruising or bleeding Dry cough, shortness of breath or trouble breathing Heart rhythm changes--fast or irregular heartbeat, dizziness, feeling faint or lightheaded, chest pain, trouble breathing Infection--fever, chills, cough, sore throat, wounds that don't heal, pain or trouble when passing urine, general feeling of discomfort or being unwell Liver injury--right upper belly pain, loss of appetite, nausea, light-colored stool, dark yellow or brown urine, yellowing skin or eyes, unusual weakness or fatigue Low red blood cell level--unusual weakness or fatigue, dizziness, headache, trouble breathing Muscle injury--unusual weakness or fatigue, muscle pain, dark yellow or brown urine, decrease in amount of urine Pain, tingling, or numbness in the hands or feet Sudden and severe headache, confusion, change in vision, seizures, which may be signs of posterior reversible encephalopathy syndrome (PRES) Unusual bruising or bleeding Side effects that usually do not require medical attention (report to your care team if they continue or are bothersome): Diarrhea Nausea Pain, redness, or swelling with sores inside the mouth or throat Unusual weakness or fatigue Vomiting This list may not describe all possible side effects. Call your doctor for medical advice about side effects. You may report side effects to FDA at 1-800-FDA-1088. Where should I keep my medication? This medication is given in a hospital or clinic. It will not be stored at  home. NOTE: This sheet is a summary. It may not cover all possible information. If you have questions about this medicine, talk to your doctor, pharmacist, or health care provider.  2023 Elsevier/Gold Standard (2021-05-08 00:00:00)  Irinotecan Injection What is this medication? IRINOTECAN (ir in oh TEE kan) treats some types of cancer. It works by slowing down the growth of cancer cells. This medicine may be used for other purposes; ask your health care provider or pharmacist if you have questions. COMMON BRAND NAME(S): Camptosar What should I tell my care team before I take this medication? They need to know if you have any of these conditions: Dehydration Diarrhea Infection, especially a viral infection, such as chickenpox, cold sores, herpes Liver disease Low blood cell levels (white cells, red cells, and platelets) Low levels of electrolytes, such as calcium, magnesium, or potassium in your blood Recent or ongoing radiation An unusual or allergic reaction to irinotecan, other medications, foods, dyes, or preservatives If you or your partner are pregnant or trying to get pregnant Breast-feeding How should I use this medication? This medication is injected into a vein. It is given by your care team in a hospital or clinic setting. Talk to your care team about the use of this medication in  children. Special care may be needed. Overdosage: If you think you have taken too much of this medicine contact a poison control center or emergency room at once. NOTE: This medicine is only for you. Do not share this medicine with others. What if I miss a dose? Keep appointments for follow-up doses. It is important not to miss your dose. Call your care team if you are unable to keep an appointment. What may interact with this medication? Do not take this medication with any of the following: Cobicistat Itraconazole This medication may also interact with the following: Certain antibiotics, such  as clarithromycin, rifampin, rifabutin Certain antivirals for HIV or AIDS Certain medications for fungal infections, such as ketoconazole, posaconazole, voriconazole Certain medications for seizures, such as carbamazepine, phenobarbital, phenytoin Gemfibrozil Nefazodone St. John's wort This list may not describe all possible interactions. Give your health care provider a list of all the medicines, herbs, non-prescription drugs, or dietary supplements you use. Also tell them if you smoke, drink alcohol, or use illegal drugs. Some items may interact with your medicine. What should I watch for while using this medication? Your condition will be monitored carefully while you are receiving this medication. You may need blood work while taking this medication. This medication may make you feel generally unwell. This is not uncommon as chemotherapy can affect healthy cells as well as cancer cells. Report any side effects. Continue your course of treatment even though you feel ill unless your care team tells you to stop. This medication can cause serious side effects. To reduce the risk, your care team may give you other medications to take before receiving this one. Be sure to follow the directions from your care team. This medication may affect your coordination, reaction time, or judgement. Do not drive or operate machinery until you know how this medication affects you. Sit up or stand slowly to reduce the risk of dizzy or fainting spells. Drinking alcohol with this medication can increase the risk of these side effects. This medication may increase your risk of getting an infection. Call your care team for advice if you get a fever, chills, sore throat, or other symptoms of a cold or flu. Do not treat yourself. Try to avoid being around people who are sick. Avoid taking medications that contain aspirin, acetaminophen, ibuprofen, naproxen, or ketoprofen unless instructed by your care team. These  medications may hide a fever. This medication may increase your risk to bruise or bleed. Call your care team if you notice any unusual bleeding. Be careful brushing or flossing your teeth or using a toothpick because you may get an infection or bleed more easily. If you have any dental work done, tell your dentist you are receiving this medication. Talk to your care team if you or your partner are pregnant or think either of you might be pregnant. This medication can cause serious birth defects if taken during pregnancy and for 6 months after the last dose. You will need a negative pregnancy test before starting this medication. Contraception is recommended while taking this medication and for 6 months after the last dose. Your care team can help you find the option that works for you. Do not father a child while taking this medication and for 3 months after the last dose. Use a condom for contraception during this time period. Do not breastfeed while taking this medication and for 7 days after the last dose. This medication may cause infertility. Talk to your care team if you  are concerned about your fertility. What side effects may I notice from receiving this medication? Side effects that you should report to your care team as soon as possible: Allergic reactions--skin rash, itching, hives, swelling of the face, lips, tongue, or throat Dry cough, shortness of breath or trouble breathing Increased saliva or tears, increased sweating, stomach cramping, diarrhea, small pupils, unusual weakness or fatigue, slow heartbeat Infection--fever, chills, cough, sore throat, wounds that don't heal, pain or trouble when passing urine, general feeling of discomfort or being unwell Kidney injury--decrease in the amount of urine, swelling of the ankles, hands, or feet Low red blood cell level--unusual weakness or fatigue, dizziness, headache, trouble breathing Severe or prolonged diarrhea Unusual bruising or  bleeding Side effects that usually do not require medical attention (report to your care team if they continue or are bothersome): Constipation Diarrhea Hair loss Loss of appetite Nausea Stomach pain This list may not describe all possible side effects. Call your doctor for medical advice about side effects. You may report side effects to FDA at 1-800-FDA-1088. Where should I keep my medication? This medication is given in a hospital or clinic. It will not be stored at home. NOTE: This sheet is a summary. It may not cover all possible information. If you have questions about this medicine, talk to your doctor, pharmacist, or health care provider.  2023 Elsevier/Gold Standard (2021-05-21 00:00:00)  Leucovorin Injection What is this medication? LEUCOVORIN (loo koe VOR in) prevents side effects from certain medications, such as methotrexate. It works by increasing folate levels. This helps protect healthy cells in your body. It may also be used to treat anemia caused by low levels of folate. It can also be used with fluorouracil, a type of chemotherapy, to treat colorectal cancer. It works by increasing the effects of fluorouracil in the body. This medicine may be used for other purposes; ask your health care provider or pharmacist if you have questions. What should I tell my care team before I take this medication? They need to know if you have any of these conditions: Anemia from low levels of vitamin B12 in the blood An unusual or allergic reaction to leucovorin, folic acid, other medications, foods, dyes, or preservatives Pregnant or trying to get pregnant Breastfeeding How should I use this medication? This medication is injected into a vein or a muscle. It is given by your care team in a hospital or clinic setting. Talk to your care team about the use of this medication in children. Special care may be needed. Overdosage: If you think you have taken too much of this medicine contact a  poison control center or emergency room at once. NOTE: This medicine is only for you. Do not share this medicine with others. What if I miss a dose? Keep appointments for follow-up doses. It is important not to miss your dose. Call your care team if you are unable to keep an appointment. What may interact with this medication? Capecitabine Fluorouracil Phenobarbital Phenytoin Primidone Trimethoprim;sulfamethoxazole This list may not describe all possible interactions. Give your health care provider a list of all the medicines, herbs, non-prescription drugs, or dietary supplements you use. Also tell them if you smoke, drink alcohol, or use illegal drugs. Some items may interact with your medicine. What should I watch for while using this medication? Your condition will be monitored carefully while you are receiving this medication. This medication may increase the side effects of 5-fluorouracil. Tell your care team if you have diarrhea or mouth  sores that do not get better or that get worse. What side effects may I notice from receiving this medication? Side effects that you should report to your care team as soon as possible: Allergic reactions--skin rash, itching, hives, swelling of the face, lips, tongue, or throat This list may not describe all possible side effects. Call your doctor for medical advice about side effects. You may report side effects to FDA at 1-800-FDA-1088. Where should I keep my medication? This medication is given in a hospital or clinic. It will not be stored at home. NOTE: This sheet is a summary. It may not cover all possible information. If you have questions about this medicine, talk to your doctor, pharmacist, or health care provider.  2023 Elsevier/Gold Standard (2021-05-22 00:00:00)  Fluorouracil Injection What is this medication? FLUOROURACIL (flure oh YOOR a sil) treats some types of cancer. It works by slowing down the growth of cancer cells. This  medicine may be used for other purposes; ask your health care provider or pharmacist if you have questions. COMMON BRAND NAME(S): Adrucil What should I tell my care team before I take this medication? They need to know if you have any of these conditions: Blood disorders Dihydropyrimidine dehydrogenase (DPD) deficiency Infection, such as chickenpox, cold sores, herpes Kidney disease Liver disease Poor nutrition Recent or ongoing radiation therapy An unusual or allergic reaction to fluorouracil, other medications, foods, dyes, or preservatives If you or your partner are pregnant or trying to get pregnant Breast-feeding How should I use this medication? This medication is injected into a vein. It is administered by your care team in a hospital or clinic setting. Talk to your care team about the use of this medication in children. Special care may be needed. Overdosage: If you think you have taken too much of this medicine contact a poison control center or emergency room at once. NOTE: This medicine is only for you. Do not share this medicine with others. What if I miss a dose? Keep appointments for follow-up doses. It is important not to miss your dose. Call your care team if you are unable to keep an appointment. What may interact with this medication? Do not take this medication with any of the following: Live virus vaccines This medication may also interact with the following: Medications that treat or prevent blood clots, such as warfarin, enoxaparin, dalteparin This list may not describe all possible interactions. Give your health care provider a list of all the medicines, herbs, non-prescription drugs, or dietary supplements you use. Also tell them if you smoke, drink alcohol, or use illegal drugs. Some items may interact with your medicine. What should I watch for while using this medication? Your condition will be monitored carefully while you are receiving this medication. This  medication may make you feel generally unwell. This is not uncommon as chemotherapy can affect healthy cells as well as cancer cells. Report any side effects. Continue your course of treatment even though you feel ill unless your care team tells you to stop. In some cases, you may be given additional medications to help with side effects. Follow all directions for their use. This medication may increase your risk of getting an infection. Call your care team for advice if you get a fever, chills, sore throat, or other symptoms of a cold or flu. Do not treat yourself. Try to avoid being around people who are sick. This medication may increase your risk to bruise or bleed. Call your care team if you  notice any unusual bleeding. Be careful brushing or flossing your teeth or using a toothpick because you may get an infection or bleed more easily. If you have any dental work done, tell your dentist you are receiving this medication. Avoid taking medications that contain aspirin, acetaminophen, ibuprofen, naproxen, or ketoprofen unless instructed by your care team. These medications may hide a fever. Do not treat diarrhea with over the counter products. Contact your care team if you have diarrhea that lasts more than 2 days or if it is severe and watery. This medication can make you more sensitive to the sun. Keep out of the sun. If you cannot avoid being in the sun, wear protective clothing and sunscreen. Do not use sun lamps, tanning beds, or tanning booths. Talk to your care team if you or your partner wish to become pregnant or think you might be pregnant. This medication can cause serious birth defects if taken during pregnancy and for 3 months after the last dose. A reliable form of contraception is recommended while taking this medication and for 3 months after the last dose. Talk to your care team about effective forms of contraception. Do not father a child while taking this medication and for 3 months  after the last dose. Use a condom while having sex during this time period. Do not breastfeed while taking this medication. This medication may cause infertility. Talk to your care team if you are concerned about your fertility. What side effects may I notice from receiving this medication? Side effects that you should report to your care team as soon as possible: Allergic reactions--skin rash, itching, hives, swelling of the face, lips, tongue, or throat Heart attack--pain or tightness in the chest, shoulders, arms, or jaw, nausea, shortness of breath, cold or clammy skin, feeling faint or lightheaded Heart failure--shortness of breath, swelling of the ankles, feet, or hands, sudden weight gain, unusual weakness or fatigue Heart rhythm changes--fast or irregular heartbeat, dizziness, feeling faint or lightheaded, chest pain, trouble breathing High ammonia level--unusual weakness or fatigue, confusion, loss of appetite, nausea, vomiting, seizures Infection--fever, chills, cough, sore throat, wounds that don't heal, pain or trouble when passing urine, general feeling of discomfort or being unwell Low red blood cell level--unusual weakness or fatigue, dizziness, headache, trouble breathing Pain, tingling, or numbness in the hands or feet, muscle weakness, change in vision, confusion or trouble speaking, loss of balance or coordination, trouble walking, seizures Redness, swelling, and blistering of the skin over hands and feet Severe or prolonged diarrhea Unusual bruising or bleeding Side effects that usually do not require medical attention (report to your care team if they continue or are bothersome): Dry skin Headache Increased tears Nausea Pain, redness, or swelling with sores inside the mouth or throat Sensitivity to light Vomiting This list may not describe all possible side effects. Call your doctor for medical advice about side effects. You may report side effects to FDA at  1-800-FDA-1088. Where should I keep my medication? This medication is given in a hospital or clinic. It will not be stored at home. NOTE: This sheet is a summary. It may not cover all possible information. If you have questions about this medicine, talk to your doctor, pharmacist, or health care provider.  2023 Elsevier/Gold Standard (2021-05-19 00:00:00)

## 2021-09-30 NOTE — Progress Notes (Signed)
Patient has been examined by Dr. Delton Coombes, and vital signs and labs have been reviewed. ANC, Creatinine, LFTs, hemoglobin, and platelets are within treatment parameters per M.D. - pt may proceed with treatment. Adding irinotecan 80% dose to tx plan today per MD.  Primary RN and pharmacy notified.

## 2021-09-30 NOTE — Progress Notes (Signed)
[facility] [address], [place] [address]   CLINIC:  Medical Oncology/Hematology  PCP:  Sherrilee Gilles, DO [address] [place] [address] [phone]   REASON FOR VISIT:  Follow-up for pancreatic cancer  PRIOR THERAPY: none  NGS Results: not done  CURRENT THERAPY: under work-up  BRIEF ONCOLOGIC HISTORY:  Oncology History  Malignant neoplasm of pancreas ([place])  07/21/2021 Initial Diagnosis   Malignant neoplasm of pancreas (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 VUS in ATM called c.4367G>A and in [facility] called c.238G>A also identified. The remainder of testing was negative/normal. The report date is 09/20/2021.  The CancerNext-Expanded + RNAinsight gene panel offered by [employer] and includes sequencing and rearrangement analysis for the following 77 genes: IP, ALK, APC*, ATM*, AXIN2, BAP1, BARD1, BLM, BMPR1A, BRCA1*, BRCA2*, BRIP1*, CDC73, CDH1*,CDK4, CDKN1B, CDKN2A, CHEK2*, CTNNA1, DICER1, FANCC, FH, FLCN, GALNT12, KIF1B, LZTR1, MAX, MEN1, MET, MLH1*, MSH2*, MSH3, MSH6*, MUTYH*, NBN, NF1*, NF2, NTHL1, PALB2*, PHOX2B, PMS2*, POT1, PRKAR1A, PTCH1, PTEN*, RAD51C*, RAD51D*,RB1, RECQL, RET, SDHA, SDHAF2, SDHB, SDHC, SDHD, SMAD4, SMARCA4, SMARCB1, SMARCE1, STK11, SUFU, TMEM127, TP53*,TSC1, TSC2, VHL and XRCC2 (sequencing and deletion/duplication); EGFR, EGLN1, HOXB13, KIT, MITF, PDGFRA, POLD1 and POLE (sequencing only); EPCAM and GREM1 (deletion/duplication only).     CANCER STAGING:  Cancer Staging  Malignant neoplasm of pancreas [facility]) Staging form: Exocrine Pancreas, AJCC 8th Edition - Clinical stage from 07/29/2021: Stage IA  (cT1, cN0, cM0) - Unsigned   INTERVAL HISTORY:  Sheryl Bender, a 76 y.o. female, seen for follow-up of pancreatic cancer and toxicity assessment prior to cycle 2 of chemotherapy.  She has received first cycle of FOLFOX 2 weeks ago.  She had slight nausea but denied any vomiting.  She had cold sensitivity in the extremities lasted about 2 days.  Denied any tingling or numbness in the extremities.  Slight fatigue is tolerable.  She is drinking about 2 cans of boost per day.  REVIEW OF SYSTEMS:  Review of Systems  Constitutional:  Negative for appetite change and fatigue.  Respiratory:  Negative for shortness of breath.   Gastrointestinal:  Positive for nausea.  Neurological:  Negative for dizziness.  All other systems reviewed and are negative.   PAST MEDICAL/SURGICAL HISTORY:  Past Medical History:  Diagnosis Date   Anxiety    Arthritis    Depression    Dysrhythmia    hx palpitations greater than 5 yrs -neg echo, stress per pt ? where or dr   GERD (gastroesophageal reflux disease)    occ   Pancreatic adenocarcinoma ([place])    Pancreatic pseudocyst    Port-A-Cath in place 09/15/2021   Pulmonary embolism [facility])    September 2022   Past Surgical History:  Procedure Laterality Date   ANTERIOR LAT LUMBAR FUSION Right 12/16/2015   Procedure: RIGHT LUMBAR TWO-THREE, LUMBAR THREE-FOUR, LUMBAR FOUR-FIVE ANTEROLATERAL LUMBAR INTERBODY FUSION;  Surgeon: Erline Levine, MD;  Location: [place];  Service: Neurosurgery;  Laterality: Right;   BALLOON DILATION N/A 08/27/2021   Procedure: BALLOON DILATION;  Surgeon: Rush Landmark Telford Nab., MD;  Location: [facility];  Service: Gastroenterology;  Laterality: N/A;   BIOPSY  07/09/2021  Procedure: BIOPSY;  Surgeon: Irving Copas., MD;  Location: [place];  Service: Gastroenterology;;   CYST GASTROSTOMY  08/27/2021   Procedure: CYST GASTROSTOMY;  Surgeon: Irving Copas., MD;  Location: [facility];  Service: Gastroenterology;;    ESOPHAGOGASTRODUODENOSCOPY N/A 07/09/2021   Procedure: ESOPHAGOGASTRODUODENOSCOPY (EGD);  Surgeon: Irving Copas., MD;  Location: [place];  Service: Gastroenterology;  Laterality: N/A;   ESOPHAGOGASTRODUODENOSCOPY (EGD) WITH PROPOFOL N/A 08/27/2021   Procedure: ESOPHAGOGASTRODUODENOSCOPY (EGD) WITH PROPOFOL;  Surgeon: Rush Landmark Telford Nab., MD;  Location: [facility];  Service: Gastroenterology;  Laterality: N/A;   EUS N/A 07/09/2021   Procedure: UPPER ENDOSCOPIC ULTRASOUND (EUS) RADIAL;  Surgeon: Irving Copas., MD;  Location: [place];  Service: Gastroenterology;  Laterality: N/A;   EUS N/A 08/27/2021   Procedure: UPPER ENDOSCOPIC ULTRASOUND (EUS) LINEAR;  Surgeon: Irving Copas., MD;  Location: [facility];  Service: Gastroenterology;  Laterality: N/A;   FINE NEEDLE ASPIRATION  07/09/2021   Procedure: FINE NEEDLE ASPIRATION (FNA) LINEAR;  Surgeon: Irving Copas., MD;  Location: [place];  Service: Gastroenterology;;   IR IMAGING GUIDED PORT INSERTION  09/14/2021   LUMBAR PERCUTANEOUS PEDICLE SCREW 3 LEVEL Bilateral 12/16/2015   Procedure: PERCUTANEOUS PEDICLE SCREWS BILATERALLY AT LUMBAR TWO-FIVE;  Surgeon: Erline Levine, MD;  Location: [place];  Service: Neurosurgery;  Laterality: Bilateral;   PANCREATIC STENT PLACEMENT  08/27/2021   Procedure: PANCREATIC STENT PLACEMENT;  Surgeon: Rush Landmark Telford Nab., MD;  Location: [facility];  Service: Gastroenterology;;   TUBAL LIGATION  1974    SOCIAL HISTORY:  Social History   Socioeconomic History   Marital status: Widowed    Spouse name: Not on file   Number of children: Not on file   Years of education: Not on file   Highest education level: Not on file  Occupational History   Not on file  Tobacco Use   Smoking status: Never   Smokeless tobacco: Never  Substance and Sexual Activity   Alcohol use: No   Drug use: Never   Sexual activity: Not on file  Other Topics Concern   Not on file   Social History Narrative   Not on file   Social Determinants of Health   Financial Resource Strain: Not on file  Food Insecurity: Not on file  Transportation Needs: Not on file  Physical Activity: Not on file  Stress: Not on file  Social Connections: Not on file  Intimate Partner Violence: Not on file    FAMILY HISTORY:  Family History  Problem Relation Age of Onset   Pancreatitis Brother        alcoholic pancreatitis   Pancreatic cancer Neg Hx    Colon cancer Neg Hx     CURRENT MEDICATIONS:  Current Outpatient Medications  Medication Sig Dispense Refill   acetaminophen (TYLENOL) 500 MG tablet Take 2 tablets (1,000 mg total) by mouth every 8 (eight) hours as needed for moderate pain, headache or fever. (Patient taking differently: Take 250-500 mg by mouth every 8 (eight) hours as needed for moderate pain, headache or fever.)     alum & mag hydroxide-simeth (MAALOX/MYLANTA) 200-200-20 MG/5ML suspension Take 30 mLs by mouth every 4 (four) hours as needed for indigestion. 355 mL 0   Aromatic Inhalants (VICKS VAPOINHALER) INHA Inhale 1 puff into the lungs daily as needed (congestion).     ascorbic acid (VITAMIN C) 500 MG tablet Take 500 mg by mouth daily.     buPROPion (WELLBUTRIN XL) 300 MG 24 hr tablet Take 300 mg by  mouth in the morning.     busPIRone (BUSPAR) 10 MG tablet Take 20 mg by mouth in the morning and at bedtime.     calcium carbonate (TUMS - DOSED IN MG ELEMENTAL CALCIUM) 500 MG chewable tablet Chew 1 tablet (200 mg of elemental calcium total) by mouth 2 (two) times daily as needed for indigestion or heartburn.     Cholecalciferol (VITAMIN D3) 125 MCG (5000 UT) CAPS Take 5,000 Units by mouth every other day.     ciclopirox (LOPROX) 0.77 % cream Apply 1 application  topically at bedtime. Apply to the fingernails     escitalopram (LEXAPRO) 20 MG tablet Take 20 mg by mouth daily.     fluorouracil CALGB 26378 1,920 mg/m2 in sodium chloride 0.9 % 150 mL Inject 1,920 mg/m2  into the vein over 48 hr.     lactose free nutrition (BOOST) LIQD Take 237 mLs by mouth 2 (two) times daily between meals.     LEUCOVORIN CALCIUM IV Inject into the vein every 14 (fourteen) days.     lidocaine-prilocaine (EMLA) cream Apply a small amount to port a cath site (do not rub in) and cover with plastic wrap 1 hour prior to infusion appointments 30 g 3   linaclotide (LINZESS) 72 MCG capsule Take 1 capsule (72 mcg total) by mouth daily before breakfast. 30 capsule 2   metoprolol succinate (TOPROL-XL) 25 MG 24 hr tablet Take 25 mg by mouth daily.     mirtazapine (REMERON) 30 MG tablet Take 30 mg by mouth at bedtime.     OXALIPLATIN IV Inject into the vein every 14 (fourteen) days.     oxyCODONE (OXY IR/ROXICODONE) 5 MG immediate release tablet Take 1 tablet (5 mg total) by mouth every 6 (six) hours as needed for severe pain. 30 tablet 0   polyethylene glycol powder (MIRALAX) 17 GM/SCOOP powder Take 1 teaspoonful once daily     prochlorperazine (COMPAZINE) 10 MG tablet Take 1 tablet (10 mg total) by mouth every 6 (six) hours as needed for nausea or vomiting (Nausea or vomiting). 60 tablet 3   QUEtiapine (SEROQUEL) 200 MG tablet Take 200 mg by mouth at bedtime.     QUEtiapine (SEROQUEL) 25 MG tablet Take 25 mg by mouth 3 (three) times daily after meals.     sodium chloride (OCEAN) 0.65 % SOLN nasal spray Place 1 spray into both nostrils as needed for congestion.     vitamin B-12 (CYANOCOBALAMIN) 500 MCG tablet Take 1 tablet (500 mcg total) by mouth daily. 30 tablet 2   Zinc 25 MG TABS Take 25 mg by mouth daily.     ondansetron (ZOFRAN-ODT) 8 MG disintegrating tablet Take 1 tablet (8 mg total) by mouth every 8 (eight) hours as needed for nausea or vomiting. 30 tablet 0   No current facility-administered medications for this visit.   Facility-Administered Medications Ordered in Other Visits  Medication Dose Route Frequency Provider Last Rate Last Admin   0.9 %  sodium chloride infusion    Intravenous Continuous [name], MD   Stopped at 09/30/21 1528   fluorouracil (ADRUCIL) 2,950 mg in sodium chloride 0.9 % 91 mL chemo infusion  1,920 mg/m2 (Treatment Plan Recorded) Intravenous 1 day or 1 dose [name], MD   Infusion Verify at 09/30/21 1552    ALLERGIES:  Allergies  Allergen Reactions   Other Hives and Other (See Comments)    Cherry wood just cut- "smelled it and broke out"; cannot tolerate ANY cherry fragrances, either  Cherry Hives   Wound Dressing Adhesive Rash and Other (See Comments)    Band-Aids = local reaction    PHYSICAL EXAM:  Performance status (ECOG): 1 - Symptomatic but completely ambulatory  There were no vitals filed for this visit.  Wt Readings from Last 3 Encounters:  09/30/21 114 lb 4.8 oz (51.8 kg)  09/16/21 116 lb (52.6 kg)  09/14/21 122 lb (55.3 kg)   Physical Exam Vitals reviewed.  Constitutional:      Appearance: Normal appearance.  Cardiovascular:     Rate and Rhythm: Normal rate and regular rhythm.     Pulses: Normal pulses.     Heart sounds: Normal heart sounds.  Pulmonary:     Effort: Pulmonary effort is normal.     Breath sounds: Normal breath sounds.  Neurological:     General: No focal deficit present.     Mental Status: She is alert and oriented to person, place, and time.  Psychiatric:        Mood and Affect: Mood normal.        Behavior: Behavior normal.      LABORATORY DATA:  I have reviewed the labs as listed.     Latest Ref Rng & Units 09/30/2021    8:28 AM 09/16/2021    7:55 AM 08/31/2021    5:05 AM  CBC  WBC 4.0 - 10.5 K/uL 3.1  3.9  6.5   Hemoglobin 12.0 - 15.0 g/dL 11.5  12.2  10.3   Hematocrit 36.0 - 46.0 % 35.9  38.2  32.8   Platelets 150 - 400 K/uL 194  245  300       Latest Ref Rng & Units 09/30/2021    8:28 AM 09/16/2021    7:55 AM 08/31/2021    5:05 AM  CMP  Glucose 70 - 99 mg/dL 128  118  86   BUN 8 - 23 mg/dL 25  22  6    Creatinine 0.44 - 1.00 mg/dL 0.87  0.87  0.70    Sodium 135 - 145 mmol/L 139  139  141   Potassium 3.5 - 5.1 mmol/L 3.8  3.8  3.4   Chloride 98 - 111 mmol/L 106  107  110   CO2 22 - 32 mmol/L 28  25  24    Calcium 8.9 - 10.3 mg/dL 8.9  9.2  8.2   Total Protein 6.5 - 8.1 g/dL 6.5  7.1  5.6   Total Bilirubin 0.3 - 1.2 mg/dL 0.5  0.7  0.5   Alkaline Phos 38 - 126 U/L 91  94  57   AST 15 - 41 U/L 22  24  13    ALT 0 - 44 U/L 25  27  13      DIAGNOSTIC IMAGING:  I have independently reviewed the scans and discussed with the patient.    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 - EGD/EUS (07/09/2021) by Dr. Rush Landmark: The region where the PD is strictured, darker appearance was identified.  There was no clear hypoechoic mass.  Endosonographic appearance suggestive of either chronic pancreatitis changes versus possible malignancy.  Endosonographic staging T1 N0 MX.  Pancreatic parenchymal abnormalities consisting of atrophy and hyperechoic strands.  No sign of significant pathology in the CBD.  No malignant appearing lymph nodes in the celiac region, peripancreatic region and porta hepatic region - Pathology (pancreatic duct FNA): Malignant cells consistent  with adenocarcinoma - CA 19-9: 31 (0-35) - CTAP (07/13/2021): Fluid collection extending posterior to the gastric antrum and ventral to the pancreas.  Elongated collection extends to the greater curvature of the stomach with inflammation surrounding the collection suggestive of a pancreatic pseudocyst.  Chronic dilatation of pancreatic duct with abrupt termination of the duct dilatation in the head of the pancreas. - CT chest (07/16/2021): No evidence of metastatic disease in the chest. - 8 to 9 pound weight loss in the last 3 months, decreased appetite.       - PET scan (07/30/2021): Faint focus of increased metabolic activity, SUV 3.6, measuring 9 mm at the junction  of the pancreatic head and body, suspicious for pancreatic adenocarcinoma.  No obvious extrapancreatic spread of tumor.  Cystic lesion along the posterior margin of the stomach and anterior margin of the pancreatic head favored pseudocyst.  Small amount of pelvic ascites. - MRI of the abdomen pancreatic protocol (08/03/2021): Similar appearance of the pancreas, with abrupt transition from dilated to decompressed duct in the pancreatic head with no evidence of underlying mass.  Pseudocyst enlarged position between pancreatic head and GE junction with significant mass effect upon the pyloric region and gastric outlet obstruction radiologically.  No evidence of abdominal metastatic disease. - FOLFOX on 09/16/2021, FOLFIRINOX cycle 2 on 09/30/2021    Social/family history: - She is accompanied by her son [name] today.  She lives at home by herself.  She is independent of ADLs and IADLs.  She did office work prior to retirement.  Non-smoker and nonalcoholic. - Brother (alcoholic) and maternal half uncle had pancreatitis.  No history of malignancy.  3. Pulmonary embolism: - She was diagnosed with pulmonary embolism in January 2023 and then will hospital.  There does not appear to be any provoking factors for PE.  Reportedly Doppler was negative for DVT.  She was treated with Eliquis for 60 days, discontinued after that.   PLAN:  Stage I (T1 N0 MX) pancreatic adenocarcinoma: -Received cycle 1 of FOLFOX, 20% dose reduction on 09/16/2021 and tolerated reasonably well except slight nausea and fatigue. - Reviewed labs today which showed normal LFTs.  CBC was grossly normal with mild leukopenia and ANC of 1.5. - We talked about introducing Irinotecan to the current regimen.  We discussed side effects in detail. - She will proceed with cycle 2 of FOLFIRINOX today with 20% dose reduction. - I will add pegylated G-CSF on day 3 because of leukopenia and neutropenia. - RTC 2 weeks for follow-up with repeat labs and  treatment.    Orders placed this encounter:  No orders of the defined types were placed in this encounter.     [name], MD [place] [phone]

## 2021-09-30 NOTE — Progress Notes (Signed)
Pt presents today for Folfirinox per provider's order. Vital signs and labs WNL for treatment today. Dr.K is adding irinotecan 80% dose. Pt also needs Udenyca (or similar) added to day 3 pump disconnection per Dr.K  Okay to proceed with treatment today per Dr.K.   Folfirinox given today per MD orders. Tolerated infusion without adverse affects. Vital signs stable. No complaints at this time. Discharged from clinic via wheelchair in stable condition. Alert and oriented x 3. F/U with [facility] as scheduled.

## 2021-09-30 NOTE — Patient Instructions (Addendum)
[place] at [facility] Discharge Instructions   You were seen and examined today by Dr. Delton Coombes.  He reviewed the results of your lab work which are normal/stable. Your white blood cell count is low. This is an effect from the chemotherapy. We will add a white blood cell booster shot to your treatment plan. These shots may cause bone aches and pain. You should take Claritin starting the day of treatment and for several days after having your pump taken off to help alleviate the body aches.   We will proceed with your treatment today.   Return as scheduled.    Thank you for choosing [place] at [facility] to provide your oncology and hematology care.  To afford each patient quality time with our provider, please arrive at least 15 minutes before your scheduled appointment time.   If you have a lab appointment with the [facility] please come in thru the Main Entrance and check in at the main information desk.  You need to re-schedule your appointment should you arrive 10 or more minutes late.  We strive to give you quality time with our providers, and arriving late affects you and other patients whose appointments are after yours.  Also, if you no show three or more times for appointments you may be dismissed from the clinic at the providers discretion.     Again, thank you for choosing [facility].  Our hope is that these requests will decrease the amount of time that you wait before being seen by our physicians.       _____________________________________________________________  Should you have questions after your visit to [facility], please contact our office at [phone] and follow the prompts.  Our office hours are 8:00 a.m. and 4:30 p.m. Monday - Friday.  Please note that voicemails left after 4:00 p.m. may not be returned until the following business day.  We are closed weekends and major holidays.   You do have access to a nurse 24-7, just call the main number to the clinic [phone] and do not press any options, hold on the line and a nurse will answer the phone.    For prescription refill requests, have your pharmacy contact our office and allow 72 hours.    Due to Covid, you will need to wear a mask upon entering the hospital. If you do not have a mask, a mask will be given to you at the Main Entrance upon arrival. For doctor visits, patients may have 1 support person age 62 or older with them. For treatment visits, patients can not have anyone with them due to social distancing guidelines and our immunocompromised population.

## 2021-10-01 ENCOUNTER — Encounter (HOSPITAL_COMMUNITY): Payer: Self-pay | Admitting: Gastroenterology

## 2021-10-02 ENCOUNTER — Inpatient Hospital Stay: Payer: Medicare Other

## 2021-10-02 ENCOUNTER — Other Ambulatory Visit: Payer: Self-pay

## 2021-10-02 VITALS — BP 124/70 | HR 81 | Temp 98.0°F | Resp 18

## 2021-10-02 DIAGNOSIS — Z95828 Presence of other vascular implants and grafts: Secondary | ICD-10-CM

## 2021-10-02 DIAGNOSIS — Z5111 Encounter for antineoplastic chemotherapy: Secondary | ICD-10-CM | POA: Diagnosis not present

## 2021-10-02 DIAGNOSIS — C250 Malignant neoplasm of head of pancreas: Secondary | ICD-10-CM

## 2021-10-02 MED ORDER — SODIUM CHLORIDE 0.9% FLUSH
10.0000 mL | INTRAVENOUS | Status: DC | PRN
  Administered 2021-10-02: 10 mL

## 2021-10-02 MED ORDER — LACTULOSE 10 GM/15ML PO SOLN: 10.0000 g | Freq: Every day | ORAL | 0 refills | Status: DC

## 2021-10-02 MED ORDER — PEGFILGRASTIM-JMDB 6 MG/0.6ML ~~LOC~~ SOSY
6.0000 mg | PREFILLED_SYRINGE | Freq: Once | SUBCUTANEOUS | Status: AC
  Administered 2021-10-02: 6 mg via SUBCUTANEOUS
  Filled 2021-10-02: qty 0.6

## 2021-10-02 MED ORDER — HEPARIN SOD (PORK) LOCK FLUSH 100 UNIT/ML IV SOLN
500.0000 [IU] | Freq: Once | INTRAVENOUS | Status: AC | PRN
  Administered 2021-10-02: 500 [IU]

## 2021-10-02 NOTE — Patient Instructions (Signed)
[facility]  Discharge Instructions: Thank you for choosing [facility] to provide your oncology and hematology care.  If you have a lab appointment with the Cancer Center, please come in thru the Main Entrance and check in at the main information desk.  Wear comfortable clothing and clothing appropriate for easy access to any Portacath or PICC line.   We strive to give you quality time with your provider. You may need to reschedule your appointment if you arrive late (15 or more minutes).  Arriving late affects you and other patients whose appointments are after yours.  Also, if you miss three or more appointments without notifying the office, you may be dismissed from the clinic at the provider's discretion.      For prescription refill requests, have your pharmacy contact our office and allow 72 hours for refills to be completed.     To help prevent nausea and vomiting after your treatment, we encourage you to take your nausea medication as directed.  BELOW ARE SYMPTOMS THAT SHOULD BE REPORTED IMMEDIATELY: *FEVER GREATER THAN 100.4 F (38 C) OR HIGHER *CHILLS OR SWEATING *NAUSEA AND VOMITING THAT IS NOT CONTROLLED WITH YOUR NAUSEA MEDICATION *UNUSUAL SHORTNESS OF BREATH *UNUSUAL BRUISING OR BLEEDING *URINARY PROBLEMS (pain or burning when urinating, or frequent urination) *BOWEL PROBLEMS (unusual diarrhea, constipation, pain near the anus) TENDERNESS IN MOUTH AND THROAT WITH OR WITHOUT PRESENCE OF ULCERS (sore throat, sores in mouth, or a toothache) UNUSUAL RASH, SWELLING OR PAIN  UNUSUAL VAGINAL DISCHARGE OR ITCHING   Items with * indicate a potential emergency and should be followed up as soon as possible or go to the Emergency Department if any problems should occur.  Please show the CHEMOTHERAPY ALERT CARD or IMMUNOTHERAPY ALERT CARD at check-in to the Emergency Department and triage nurse.  Should you have questions after your visit or need to  cancel or reschedule your appointment, please contact [facility] [phone]  and follow the prompts.  Office hours are 8:00 a.m. to 4:30 p.m. Monday - Friday. Please note that voicemails left after 4:00 p.m. may not be returned until the following business day.  We are closed weekends and major holidays. You have access to a nurse at all times for urgent questions. Please call the main number to the clinic [phone] and follow the prompts.  For any non-urgent questions, you may also contact your provider using MyChart. We now offer e-Visits for anyone 18 and older to request care online for non-urgent symptoms. For details visit [email or web address].   Also download the MyChart app! Go to the app store, search "MyChart", open the app, select [facility], and log in with your MyChart username and password.  Masks are optional in the cancer centers. If you would like for your care team to wear a mask while they are taking care of you, please let them know. You may have one support person who is at least 78 years old accompany you for your appointments.  

## 2021-10-02 NOTE — Progress Notes (Signed)
Reviewed constipation medications and regimen.  A prescription for lactulose was sent to the pharmacy with instructions.  Reviewed the triage line to call if no bowel movement for further instructions.  Patient verbalized understanding.    Patient tolerated injection with no complaints voiced.  Site clean and dry with no bruising or swelling noted at site.  See MAR for details.  Band aid applied.  Patient stable during and after injection.  Vss with discharge and left in satisfactory condition with no s/s of distress noted.    Patient for chemotherapy pump disconnect with no complaints voiced.  Patients port flushed without difficulty.  Good blood return noted with no bruising or swelling noted at site.  Band aid applied.  VSS with discharge and left ambulatory with no s/s of distress noted.

## 2021-10-07 ENCOUNTER — Other Ambulatory Visit: Payer: Self-pay

## 2021-10-08 ENCOUNTER — Encounter (HOSPITAL_COMMUNITY)
Admission: RE | Disposition: A | Discharge status: Home / Self Care | Payer: Self-pay | Source: Ambulatory Visit | Attending: Gastroenterology

## 2021-10-08 ENCOUNTER — Other Ambulatory Visit: Payer: Self-pay

## 2021-10-08 ENCOUNTER — Ambulatory Visit (HOSPITAL_BASED_OUTPATIENT_CLINIC_OR_DEPARTMENT_OTHER): Payer: Medicare Other | Admitting: Anesthesiology

## 2021-10-08 ENCOUNTER — Ambulatory Visit (HOSPITAL_COMMUNITY): Payer: Medicare Other | Admitting: Anesthesiology

## 2021-10-08 ENCOUNTER — Ambulatory Visit (HOSPITAL_COMMUNITY)
Admission: RE | Admit: 2021-10-08 | Discharge: 2021-10-08 | Disposition: A | Discharge status: Home / Self Care | Payer: Medicare Other | Source: Ambulatory Visit | Attending: Gastroenterology | Admitting: Gastroenterology

## 2021-10-08 ENCOUNTER — Encounter (HOSPITAL_COMMUNITY): Payer: Self-pay | Admitting: Gastroenterology

## 2021-10-08 DIAGNOSIS — K219 Gastro-esophageal reflux disease without esophagitis: Secondary | ICD-10-CM | POA: Insufficient documentation

## 2021-10-08 DIAGNOSIS — I10 Essential (primary) hypertension: Secondary | ICD-10-CM

## 2021-10-08 DIAGNOSIS — Z859 Personal history of malignant neoplasm, unspecified: Secondary | ICD-10-CM

## 2021-10-08 DIAGNOSIS — Z09 Encounter for follow-up examination after completed treatment for conditions other than malignant neoplasm: Secondary | ICD-10-CM | POA: Insufficient documentation

## 2021-10-08 DIAGNOSIS — Z8507 Personal history of malignant neoplasm of pancreas: Secondary | ICD-10-CM | POA: Insufficient documentation

## 2021-10-08 DIAGNOSIS — Z08 Encounter for follow-up examination after completed treatment for malignant neoplasm: Secondary | ICD-10-CM

## 2021-10-08 DIAGNOSIS — K449 Diaphragmatic hernia without obstruction or gangrene: Secondary | ICD-10-CM | POA: Insufficient documentation

## 2021-10-08 DIAGNOSIS — K3189 Other diseases of stomach and duodenum: Secondary | ICD-10-CM | POA: Insufficient documentation

## 2021-10-08 DIAGNOSIS — Z4659 Encounter for fitting and adjustment of other gastrointestinal appliance and device: Secondary | ICD-10-CM | POA: Diagnosis not present

## 2021-10-08 DIAGNOSIS — K8689 Other specified diseases of pancreas: Secondary | ICD-10-CM

## 2021-10-08 DIAGNOSIS — K2289 Other specified disease of esophagus: Secondary | ICD-10-CM | POA: Insufficient documentation

## 2021-10-08 DIAGNOSIS — C259 Malignant neoplasm of pancreas, unspecified: Secondary | ICD-10-CM

## 2021-10-08 DIAGNOSIS — F418 Other specified anxiety disorders: Secondary | ICD-10-CM

## 2021-10-08 HISTORY — PX: ESOPHAGOGASTRODUODENOSCOPY (EGD) WITH PROPOFOL: SHX5813

## 2021-10-08 HISTORY — PX: STENT REMOVAL: SHX6421

## 2021-10-08 SURGERY — ESOPHAGOGASTRODUODENOSCOPY (EGD) WITH PROPOFOL
Anesthesia: Monitor Anesthesia Care

## 2021-10-08 MED ORDER — LIDOCAINE HCL (CARDIAC) PF 100 MG/5ML IV SOSY
PREFILLED_SYRINGE | INTRAVENOUS | Status: DC | PRN
  Administered 2021-10-08: 60 mg via INTRAVENOUS

## 2021-10-08 MED ORDER — PROPOFOL 10 MG/ML IV BOLUS
INTRAVENOUS | Status: DC | PRN
  Administered 2021-10-08 (×2): 20 mg via INTRAVENOUS
  Administered 2021-10-08: 40 mg via INTRAVENOUS
  Administered 2021-10-08: 20 mg via INTRAVENOUS

## 2021-10-08 MED ORDER — SODIUM CHLORIDE 0.9 % IV SOLN: INTRAVENOUS | Status: DC

## 2021-10-08 MED ORDER — MIDAZOLAM HCL 2 MG/2ML IJ SOLN
INTRAMUSCULAR | Status: AC
  Filled 2021-10-08: qty 2

## 2021-10-08 MED ORDER — LACTATED RINGERS IV SOLN: INTRAVENOUS | Status: DC

## 2021-10-08 MED ORDER — OMEPRAZOLE 40 MG PO CPDR: 40.0000 mg | DELAYED_RELEASE_CAPSULE | Freq: Every day | ORAL | 12 refills | Status: DC

## 2021-10-08 MED ORDER — LINACLOTIDE 72 MCG PO CAPS: 72.0000 ug | ORAL_CAPSULE | Freq: Every day | ORAL | 12 refills | Status: DC

## 2021-10-08 MED ORDER — PROPOFOL 10 MG/ML IV BOLUS
INTRAVENOUS | Status: AC
  Filled 2021-10-08: qty 20

## 2021-10-08 SURGICAL SUPPLY — 15 items
BLOCK BITE 60FR ADLT L/F BLUE (MISCELLANEOUS) ×2 IMPLANT
ELECT REM PT RETURN 9FT ADLT (ELECTROSURGICAL) IMPLANT
ELECTRODE REM PT RTRN 9FT ADLT (ELECTROSURGICAL) IMPLANT
FORCEP RJ3 GP 1.8X160 W-NEEDLE (CUTTING FORCEPS) IMPLANT
FORCEPS BIOP RAD 4 LRG CAP 4 (CUTTING FORCEPS) IMPLANT
NDL SCLEROTHERAPY 25GX240 (NEEDLE) IMPLANT
NEEDLE SCLEROTHERAPY 25GX240 (NEEDLE) IMPLANT
PROBE APC STR FIRE (PROBE) IMPLANT
PROBE INJECTION GOLD (MISCELLANEOUS)
PROBE INJECTION GOLD 7FR (MISCELLANEOUS) IMPLANT
SNARE SHORT THROW 13M SML OVAL (MISCELLANEOUS) IMPLANT
SYR 50ML LL SCALE MARK (SYRINGE) IMPLANT
TUBING ENDO SMARTCAP PENTAX (MISCELLANEOUS) ×4 IMPLANT
TUBING IRRIGATION ENDOGATOR (MISCELLANEOUS) ×2 IMPLANT
WATER STERILE IRR 1000ML POUR (IV SOLUTION) IMPLANT

## 2021-10-08 NOTE — Transfer of Care (Signed)
Immediate Anesthesia Transfer of Care Note  [name]  Procedure(s) Performed: ESOPHAGOGASTRODUODENOSCOPY (EGD) WITH PROPOFOL STENT REMOVAL  Patient Location: PACU  Anesthesia Type:MAC  Level of Consciousness: awake, drowsy and patient cooperative  Airway & Oxygen Therapy: Patient Spontanous Breathing  Post-op Assessment: Report given to RN, Post -op Vital signs reviewed and stable and Patient moving all extremities X 4  Post vital signs: Reviewed and stable  Last Vitals:  Vitals Value Taken Time  BP 94/45 10/08/21 1036  Temp    Pulse 69 10/08/21 1037  Resp 21 10/08/21 1037  SpO2 93 % 10/08/21 1037  Vitals shown include unvalidated device data.  Last Pain:  Vitals:   10/08/21 0824  TempSrc: Temporal  PainSc: 0-No pain         Complications: No notable events documented.

## 2021-10-08 NOTE — Op Note (Addendum)
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 Ellin Mayhew, MD Medicines:                Monitored Anesthesia Care Complications:            No immediate complications. Estimated Blood Loss:     Estimated blood loss was minimal. Procedure:                Pre-Anesthesia Assessment:                           - Prior to the procedure, a History and Physical                            was performed, and patient medications and                            allergies were reviewed. The patient's tolerance of                            previous anesthesia was also reviewed. The risks                            and benefits of the procedure and the sedation                            options and risks were discussed with the patient.                            All questions were answered, and informed consent                            was obtained. Prior Anticoagulants: The patient has                            taken no previous anticoagulant or antiplatelet  agents. ASA Grade Assessment: III - A patient with                            severe systemic disease. After reviewing the risks                            and benefits, the patient was deemed in                            satisfactory condition to undergo the procedure.                           After obtaining informed consent, the endoscope was                             passed under direct vision. Throughout the                            procedure, the patient's blood pressure, pulse, and                            oxygen saturations were monitored continuously. The                            [id number] ([id number]) Olympus therapeutic endoscope                            was introduced through the mouth, and advanced to                            the second part of duodenum. The upper GI endoscopy                            was accomplished without difficulty. The patient                            tolerated the procedure. Scope In: Scope Out: Findings:      No gross lesions were noted in the entire esophagus.      The Z-line was irregular and was found 33 cm from the incisors.      A 3 cm hiatal hernia was present.      Multiple dispersed small erosions with no bleeding and no stigmata of       recent bleeding were found in the entire examined stomach. Biopsies have       previously been obtained so they were not repeated.      A previously placed AXIOS cystgastrostomy stent with a double-pigtail       stent were found on the greater curvature of the stomach. Stent removals       were accomplished with a Raptor grasping device.      The cystgastrostomy tract was then entered and direct vision pursued.       The cyst was clean with some mild oozing noted but no large component       still present. A large amount of pink, viable tissue was  found within       the pseudocyst on direct vision.      No gross lesions were noted in the duodenal bulb, in the first portion       of the duodenum and in the second portion of the duodenum. Impression:               - No gross lesions in esophagus. Z-line irregular,                            33 cm from the incisors.                           - 3 cm hiatal hernia.                           - Erosive gastropathy with no bleeding and no                            stigmata of recent bleeding -  previously biopsied                            and negative for HP.                           - Pre-existing AXIOS/double pigtail cystgastrostomy                            stents were noted and removed.                           - Pseudocyst cavity entered and has substantially                            decreased in size and has healthy wall appearance.                            Mild ooze noted after stent removal.                           - No gross lesions in the duodenal bulb, in the                            first portion of the duodenum and in the second                            portion of the duodenum. Moderate Sedation:      Not Applicable - Patient had care per Anesthesia. Recommendation:           - The patient will be observed post-procedure,                            until all discharge criteria are met.                           - Discharge patient to home.                           -  Patient has a contact number available for                            emergencies. The signs and symptoms of potential                            delayed complications were discussed with the                            patient. Return to normal activities tomorrow.                            Written discharge instructions were provided to the                            patient.                           - Low fat diet.                           - Restart PPI once daily.                           - Continue present medications otherwise.                           - Repeat a CT-Abdomen in 43-monthto re-evaluate and                            ensure we are not seeing recurrence of cyst that                            could require repeat drainage +/- ERCP attempt at                            PD stenting.                           - The findings and recommendations were discussed                            with the patient.                           - The findings and recommendations were  discussed                            with the patient's family. Procedure Code(s):        --- Professional ---                           [phone] Esophagogastroduodenoscopy, flexible,                            transoral; with removal of foreign body(s)  48999, Unlisted procedure, pancreas Diagnosis Code(s):        --- Professional ---                           K22.8, Other specified diseases of esophagus                           K44.9, Diaphragmatic hernia without obstruction or                            gangrene                           K31.89, Other diseases of stomach and duodenum                           Z97.8, Presence of other specified devices                           Z46.59, Encounter for fitting and adjustment of                            other gastrointestinal appliance and device                           Z09, Encounter for follow-up examination after                            completed treatment for conditions other than                            malignant neoplasm                           Z85.9, Personal history of malignant neoplasm,                            unspecified CPT copyright 2019 [employer]. All rights reserved. The codes documented in this report are preliminary and upon coder review may  be revised to meet current compliance requirements. Justice Britain, MD 10/08/2021 10:36:45 AM Number of Addenda: 0

## 2021-10-08 NOTE — Discharge Instructions (Signed)
YOU HAD AN ENDOSCOPIC PROCEDURE TODAY: Refer to the procedure report and other information in the discharge instructions given to you for any specific questions about what was found during the examination. If this information does not answer your questions, please call [place] office at [phone] to clarify.   YOU SHOULD EXPECT: Some feelings of bloating in the abdomen. Passage of more gas than usual. Walking can help get rid of the air that was put into your GI tract during the procedure and reduce the bloating. If you had a lower endoscopy (such as a colonoscopy or flexible sigmoidoscopy) you may notice spotting of blood in your stool or on the toilet paper. Some abdominal soreness may be present for a day or two, also.  DIET: Your first meal following the procedure should be a light meal and then it is ok to progress to your normal diet. A half-sandwich or bowl of soup is an example of a good first meal. Heavy or fried foods are harder to digest and may make you feel nauseous or bloated. Drink plenty of fluids but you should avoid alcoholic beverages for 24 hours. If you had a esophageal dilation, please see attached instructions for diet.    ACTIVITY: Your care partner should take you home directly after the procedure. You should plan to take it easy, moving slowly for the rest of the day. You can resume normal activity the day after the procedure however YOU SHOULD NOT DRIVE, use power tools, machinery or perform tasks that involve climbing or major physical exertion for 24 hours (because of the sedation medicines used during the test).   SYMPTOMS TO REPORT IMMEDIATELY: A gastroenterologist can be reached at any hour. Please call [phone]  for any of the following symptoms:   Following upper endoscopy (EGD, EUS, ERCP, esophageal dilation) Vomiting of blood or coffee ground material  New, significant abdominal pain  New, significant chest pain or pain under the shoulder blades  Painful or  persistently difficult swallowing  New shortness of breath  Black, tarry-looking or red, bloody stools  FOLLOW UP:  If any biopsies were taken you will be contacted by phone or by letter within the next 1-3 weeks. Call [phone]  if you have not heard about the biopsies in 3 weeks.  Please also call with any specific questions about appointments or follow up tests.  

## 2021-10-08 NOTE — Anesthesia Postprocedure Evaluation (Addendum)
Anesthesia Post Note  [name]  Procedure(s) Performed: ESOPHAGOGASTRODUODENOSCOPY (EGD) WITH PROPOFOL STENT REMOVAL     Patient location during evaluation: PACU Anesthesia Type: MAC Level of consciousness: awake and alert Pain management: pain level controlled Vital Signs Assessment: post-procedure vital signs reviewed and stable Respiratory status: spontaneous breathing, nonlabored ventilation and respiratory function stable Cardiovascular status: stable and blood pressure returned to baseline Postop Assessment: no apparent nausea or vomiting Anesthetic complications: no   No notable events documented.  Last Vitals:  Vitals:   10/08/21 0824  BP: 127/83  Resp: 12  Temp: 36.5 C  SpO2: 94%    Last Pain:  Vitals:   10/08/21 0824  TempSrc: Temporal  PainSc: 0-No pain                 Al Bracewell A.

## 2021-10-08 NOTE — Anesthesia Preprocedure Evaluation (Addendum)
Anesthesia Evaluation  Patient identified by MRN, date of birth, ID band Patient awake    Reviewed: Allergy & Precautions, NPO status , Patient's Chart, lab work & pertinent test results, reviewed documented beta blocker date and time   Airway Mallampati: II  TM Distance: >3 FB Neck ROM: Full    Dental  (+) Teeth Intact, Dental Advisory Given   Pulmonary PE (9/22)   Pulmonary exam normal breath sounds clear to auscultation       Cardiovascular hypertension, Pt. on home beta blockers and Pt. on medications Normal cardiovascular exam+ dysrhythmias  Rhythm:Regular Rate:Normal     Neuro/Psych PSYCHIATRIC DISORDERS Anxiety Depression negative neurological ROS     GI/Hepatic GERD  Medicated,Pancreatic pseudocyst Pancreatic adenocarcinoma  Indwelling pancreatic stent   Endo/Other  negative endocrine ROS  Renal/GU negative Renal ROS     Musculoskeletal  (+) Arthritis , Osteoarthritis,    Abdominal   Peds  Hematology  (+) Blood dyscrasia, anemia ,   Anesthesia Other Findings   Reproductive/Obstetrics                            Anesthesia Physical  Anesthesia Plan  ASA: 4  Anesthesia Plan: MAC   Post-op Pain Management: Minimal or no pain anticipated   Induction: Intravenous  PONV Risk Score and Plan: 2 and Treatment may vary due to age or medical condition and Propofol infusion  Airway Management Planned: Natural Airway, Simple Face Mask and Nasal Cannula  Additional Equipment:   Intra-op Plan:   Post-operative Plan:   Informed Consent: I have reviewed the patients History and Physical, chart, labs and discussed the procedure including the risks, benefits and alternatives for the proposed anesthesia with the patient or authorized representative who has indicated his/her understanding and acceptance.     Dental advisory given  Plan Discussed with: CRNA and  Anesthesiologist  Anesthesia Plan Comments:        Anesthesia Quick Evaluation

## 2021-10-08 NOTE — Progress Notes (Unsigned)
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 VUS in ATM called c.4367G>A and in [facility] called c.238G>A also identified. The remainder of testing was negative/normal. The report date is 09/20/2021.  The CancerNext-Expanded + RNAinsight gene panel offered by [employer] and includes sequencing and rearrangement analysis for the following 77 genes: IP, ALK, APC*, ATM*, AXIN2, BAP1, BARD1, BLM, BMPR1A, BRCA1*, BRCA2*, BRIP1*, CDC73, CDH1*,CDK4, CDKN1B, CDKN2A, CHEK2*, CTNNA1, DICER1, FANCC, FH, FLCN, GALNT12, KIF1B, LZTR1, MAX, MEN1, MET, MLH1*, MSH2*, MSH3, MSH6*, MUTYH*, NBN, NF1*, NF2, NTHL1, PALB2*, PHOX2B, PMS2*, POT1, PRKAR1A, PTCH1, PTEN*, RAD51C*, RAD51D*,RB1, RECQL, RET, SDHA, SDHAF2, SDHB, SDHC, SDHD, SMAD4, SMARCA4, SMARCB1, SMARCE1, STK11, SUFU, TMEM127, TP53*,TSC1, TSC2, VHL and XRCC2 (sequencing and deletion/duplication); EGFR, EGLN1, HOXB13, KIT, MITF, PDGFRA, POLD1 and POLE (sequencing only); EPCAM and GREM1 (deletion/duplication only).     CANCER STAGING:  Cancer Staging  Malignant neoplasm of pancreas [facility]) Staging form: Exocrine Pancreas, AJCC 8th Edition - Clinical stage from 07/29/2021: Stage IA  (cT1, cN0, cM0) - Unsigned   INTERVAL HISTORY:  Sheryl Bender, a 76 y.o. female, seen for follow-up of pancreatic cancer and toxicity assessment prior to cycle 3 of chemotherapy.  She has received two cycles of FOLFOX. She had slight nausea but denied any vomiting.  She had cold sensitivity in the extremities lasted about 2 days.  Denied any tingling or numbness in the extremities.  Slight fatigue is tolerable.  She is drinking about 2 cans of boost per day.  Today she is seen unaccompanied and states that she has struggled with anxiety.  She is followed by a psychiatrist in [place].  She is on multiple meds including BuSpar, Wellbutrin, and Lexapro.  She is having night sweats.  She has a history of hypoglycemia in the past.  I have advised her to try having a snack before bed that includes protein and/or fat to see if her night sweats are related to her blood sugar dropping.  We we will proceed with treatment today.  REVIEW OF SYSTEMS:  Review of Systems  Constitutional:  Positive for diaphoresis (has had night sweats since going on antidepressants). Negative for appetite change and fatigue.  Respiratory:  Negative for shortness of breath.   Gastrointestinal:  Positive for constipation (managed with meds) and diarrhea (alternates with constipation and managed now with meds). Negative for nausea.  Neurological:  Negative for dizziness (if she gets up too fast).  Psychiatric/Behavioral:  Positive for sleep disturbance (wakes up often).   All other systems reviewed and are negative.   PAST MEDICAL/SURGICAL HISTORY:  Past Medical History:  Diagnosis Date   Anxiety    Arthritis    Depression    Dysrhythmia    hx palpitations greater  than 5 yrs -neg echo, stress per pt ? where or dr   GERD (gastroesophageal reflux disease)    occ   Pancreatic adenocarcinoma ([place])    Pancreatic pseudocyst    Port-A-Cath in place 09/15/2021   Pulmonary embolism [facility])    September 2022   Past Surgical  History:  Procedure Laterality Date   ANTERIOR LAT LUMBAR FUSION Right 12/16/2015   Procedure: RIGHT LUMBAR TWO-THREE, LUMBAR THREE-FOUR, LUMBAR FOUR-FIVE ANTEROLATERAL LUMBAR INTERBODY FUSION;  Surgeon: Erline Levine, MD;  Location: [place] [facility];  Service: Neurosurgery;  Laterality: Right;   BALLOON DILATION N/A 08/27/2021   Procedure: BALLOON DILATION;  Surgeon: Rush Landmark Telford Nab., MD;  Location: [facility];  Service: Gastroenterology;  Laterality: N/A;   BIOPSY  07/09/2021   Procedure: BIOPSY;  Surgeon: Rush Landmark Telford Nab., MD;  Location: [place];  Service: Gastroenterology;;   CYST GASTROSTOMY  08/27/2021   Procedure: CYST GASTROSTOMY;  Surgeon: Irving Copas., MD;  [facility];  Service: Gastroenterology;;   ESOPHAGOGASTRODUODENOSCOPY N/A 07/09/2021   Procedure: ESOPHAGOGASTRODUODENOSCOPY (EGD);  Surgeon: Irving Copas., MD;  Location: [place];  Service: Gastroenterology;  Laterality: N/A;   ESOPHAGOGASTRODUODENOSCOPY (EGD) WITH PROPOFOL N/A 08/27/2021   Procedure: ESOPHAGOGASTRODUODENOSCOPY (EGD) WITH PROPOFOL;  Surgeon: Rush Landmark Telford Nab., MD;  [facility];  Service: Gastroenterology;  Laterality: N/A;   EUS N/A 07/09/2021   Procedure: UPPER ENDOSCOPIC ULTRASOUND (EUS) RADIAL;  Surgeon: Irving Copas., MD;  Location: [place];  Service: Gastroenterology;  Laterality: N/A;   EUS N/A 08/27/2021   Procedure: UPPER ENDOSCOPIC ULTRASOUND (EUS) LINEAR;  Surgeon: Irving Copas., MD;  [facility];  Service: Gastroenterology;  Laterality: N/A;   FINE NEEDLE ASPIRATION  07/09/2021   Procedure: FINE NEEDLE ASPIRATION (FNA) LINEAR;  Surgeon: Irving Copas., MD;  Location: [place];  Service: Gastroenterology;;   IR IMAGING GUIDED PORT INSERTION  09/14/2021   LUMBAR PERCUTANEOUS PEDICLE SCREW 3 LEVEL Bilateral 12/16/2015   Procedure: PERCUTANEOUS PEDICLE SCREWS BILATERALLY AT LUMBAR TWO-FIVE;  Surgeon: Erline Levine, MD;  Location: [place];  Service: Neurosurgery;  Laterality: Bilateral;   PANCREATIC STENT PLACEMENT  08/27/2021   Procedure: PANCREATIC STENT PLACEMENT;  Surgeon: Rush Landmark Telford Nab., MD;  Location: [facility];  Service: Gastroenterology;;   TUBAL LIGATION  1974    SOCIAL HISTORY:  Social History   Socioeconomic History   Marital status: Widowed    Spouse name: Not on file   Number of children: Not on file   Years of education: Not on file   Highest education level: Not on file  Occupational History   Not on file  Tobacco Use   Smoking status: Never   Smokeless tobacco: Never  Substance and Sexual Activity   Alcohol use: No   Drug use: Never   Sexual activity: Not on file  Other Topics Concern   Not on file  Social History Narrative   Not on file   Social Determinants of Health   Financial Resource Strain: Not on file  Food Insecurity: Not on file  Transportation Needs: Not on file  Physical Activity: Not on file  Stress: Not on file  Social Connections: Not on file  Intimate Partner Violence: Not on file    FAMILY HISTORY:  Family History  Problem Relation Age of Onset   Pancreatitis Brother        alcoholic pancreatitis   Pancreatic cancer Neg Hx    Colon cancer Neg Hx     CURRENT MEDICATIONS:  Current Outpatient Medications  Medication Sig Dispense Refill   acetaminophen (TYLENOL) 500 MG tablet Take 2 tablets (1,000 mg total) by mouth every 8 (eight) hours as needed for moderate pain, headache or fever. (Patient taking differently: Take 250-1,000 mg by mouth every 8 (eight) hours as needed for moderate pain, headache or fever.)     alum & mag hydroxide-simeth (MAALOX/MYLANTA) 200-200-20 MG/5ML suspension Take 30 mLs by mouth every 4 (four) hours as needed for indigestion. 355 mL 0   Aromatic Inhalants (VICKS VAPOINHALER) INHA Inhale 1 puff into the lungs daily as needed (congestion).     ascorbic acid (VITAMIN C) 500 MG tablet Take 500 mg by mouth  in the morning.     buPROPion (WELLBUTRIN XL) 300 MG 24 hr tablet Take 300 mg by mouth daily after breakfast.     busPIRone (BUSPAR) 10 MG tablet Take 20 mg by mouth in the morning and at bedtime.     calcium carbonate (TUMS - DOSED IN MG ELEMENTAL CALCIUM) 500 MG chewable tablet Chew 1 tablet (200 mg of elemental calcium total) by mouth 2 (two) times daily as needed for indigestion or heartburn. (Patient taking differently: Chew 1-2 tablets by mouth 2 (two) times daily as needed for indigestion or heartburn.)     Cholecalciferol (VITAMIN D3) 125 MCG (5000 UT) CAPS Take 5,000 Units by mouth every other day.     ciclopirox (LOPROX) 0.77 % cream Apply 1 application  topically at bedtime. Apply to the fingernails     escitalopram (LEXAPRO) 20 MG tablet Take 20 mg by mouth at bedtime.     fluorouracil CALGB 40102 1,920 mg/m2 in sodium chloride 0.9 % 150 mL Inject 1,920 mg/m2 into the vein over 48 hr.     lactose free nutrition (BOOST) LIQD Take 237 mLs by mouth 2 (two) times daily between meals.     lactulose (CHRONULAC) 10 GM/15ML solution Take 15 mLs (10 g total) by mouth at bedtime. Take 15 ml at bedtime every night to assist with bowel movements. If bowel movement has not occurred in 3-4 days take 15 ml every 3 hours until bowel movement has occurred. 236 mL 0   LEUCOVORIN CALCIUM IV Inject into the vein every 14 (fourteen) days.     lidocaine-prilocaine (EMLA) cream Apply a small amount to port a cath site (do not rub in) and cover with plastic wrap 1 hour prior to infusion appointments 30 g 3   linaclotide (LINZESS) 72 MCG capsule Take 1 capsule (72 mcg total) by mouth daily before breakfast. 30 capsule 12   metoprolol succinate (TOPROL-XL) 25 MG 24 hr tablet Take 25 mg by mouth daily after breakfast.     mirtazapine (REMERON) 30 MG tablet Take 30 mg by mouth at bedtime.     omeprazole (PRILOSEC) 40 MG capsule Take 1 capsule (40 mg total) by mouth daily. 30 capsule 12   ondansetron (ZOFRAN-ODT) 8  MG disintegrating tablet Take 1 tablet (8 mg total) by mouth every 8 (eight) hours as needed for nausea or vomiting. 30 tablet 0   OXALIPLATIN IV Inject into the vein every 14 (fourteen) days.     oxyCODONE (OXY IR/ROXICODONE) 5 MG immediate release tablet Take 1 tablet (5 mg total) by mouth every 6 (six) hours as needed for severe pain. 30 tablet 0   polyethylene glycol powder (MIRALAX) 17 GM/SCOOP powder Take 17 g by mouth daily. Hold for diarrhea/loose stools     prochlorperazine (COMPAZINE) 10 MG tablet Take 1 tablet (10 mg total) by mouth  every 6 (six) hours as needed for nausea or vomiting (Nausea or vomiting). 60 tablet 3   QUEtiapine (SEROQUEL) 200 MG tablet Take 200 mg by mouth at bedtime.     QUEtiapine (SEROQUEL) 25 MG tablet Take 25 mg by mouth 3 (three) times daily after meals. (In the morning, 1300 & 1800)     sodium chloride (OCEAN) 0.65 % SOLN nasal spray Place 1 spray into both nostrils as needed for congestion.     vitamin B-12 (CYANOCOBALAMIN) 500 MCG tablet Take 1 tablet (500 mcg total) by mouth daily. 30 tablet 2   Zinc 25 MG TABS Take 25 mg by mouth daily.     No current facility-administered medications for this visit.   Facility-Administered Medications Ordered in Other Visits  Medication Dose Route Frequency Provider Last Rate Last Admin   lactated ringers infusion   Intravenous Continuous Mansouraty, Telford Nab., MD   Stopped at 10/08/21 1052    ALLERGIES:  Allergies  Allergen Reactions   Other Hives and Other (See Comments)    Cherry wood just cut- "smelled it and broke out"; cannot tolerate ANY cherry fragrances, either      Cherry Hives   Wound Dressing Adhesive Rash and Other (See Comments)    Band-Aids = local reaction    PHYSICAL EXAM:  Performance status (ECOG): 1 - Symptomatic but completely ambulatory Vital Signs: Blood pressure 129/81, pulse 86, respirations 18, temp 98.2, O2 sat 96%, weight 114.4 pounds, height 5 foot 2 inches, appetite level 30%,  energy level 50%, pain    Wt Readings from Last 3 Encounters:  10/08/21 114 lb (51.7 kg)  09/30/21 114 lb 4.8 oz (51.8 kg)  09/16/21 116 lb (52.6 kg)   Physical Exam Vitals reviewed.  Constitutional:      Appearance: Normal appearance.  Cardiovascular:     Rate and Rhythm: Normal rate and regular rhythm.     Pulses: Normal pulses.     Heart sounds: Normal heart sounds.  Pulmonary:     Effort: Pulmonary effort is normal.     Breath sounds: Normal breath sounds.  Neurological:     General: No focal deficit present.     Mental Status: She is alert and oriented to person, place, and time.  Psychiatric:        Mood and Affect: Mood normal.        Behavior: Behavior normal.     LABORATORY DATA:  I have reviewed the labs as listed.     Latest Ref Rng & Units 09/30/2021    8:28 AM 09/16/2021    7:55 AM 08/31/2021    5:05 AM  CBC  WBC 4.0 - 10.5 K/uL 3.1  3.9  6.5   Hemoglobin 12.0 - 15.0 g/dL 11.5  12.2  10.3   Hematocrit 36.0 - 46.0 % 35.9  38.2  32.8   Platelets 150 - 400 K/uL 194  245  300       Latest Ref Rng & Units 09/30/2021    8:28 AM 09/16/2021    7:55 AM 08/31/2021    5:05 AM  CMP  Glucose 70 - 99 mg/dL 128  118  86   BUN 8 - 23 mg/dL 25  22  6    Creatinine 0.44 - 1.00 mg/dL 0.87  0.87  0.70   Sodium 135 - 145 mmol/L 139  139  141   Potassium 3.5 - 5.1 mmol/L 3.8  3.8  3.4   Chloride 98 - 111 mmol/L 106  107  110   CO2 22 - 32 mmol/L 28  25  24    Calcium 8.9 - 10.3 mg/dL 8.9  9.2  8.2   Total Protein 6.5 - 8.1 g/dL 6.5  7.1  5.6   Total Bilirubin 0.3 - 1.2 mg/dL 0.5  0.7  0.5   Alkaline Phos 38 - 126 U/L 91  94  57   AST 15 - 41 U/L 22  24  13    ALT 0 - 44 U/L 25  27  13      DIAGNOSTIC IMAGING:  I have independently reviewed the scans and discussed with the patient.    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 - EGD/EUS  (07/09/2021) by Dr. Rush Landmark: The region where the PD is strictured, darker appearance was identified.  There was no clear hypoechoic mass.  Endosonographic appearance suggestive of either chronic pancreatitis changes versus possible malignancy.  Endosonographic staging T1 N0 MX.  Pancreatic parenchymal abnormalities consisting of atrophy and hyperechoic strands.  No sign of significant pathology in the CBD.  No malignant appearing lymph nodes in the celiac region, peripancreatic region and porta hepatic region - Pathology (pancreatic duct FNA): Malignant cells consistent with adenocarcinoma - CA 19-9: 31 (0-35) - CTAP (07/13/2021): Fluid collection extending posterior to the gastric antrum and ventral to the pancreas.  Elongated collection extends to the greater curvature of the stomach with inflammation surrounding the collection suggestive of a pancreatic pseudocyst.  Chronic dilatation of pancreatic duct with abrupt termination of the duct dilatation in the head of the pancreas. - CT chest (07/16/2021): No evidence of metastatic disease in the chest. - 8 to 9 pound weight loss in the last 3 months, decreased appetite.       - PET scan (07/30/2021): Faint focus of increased metabolic activity, SUV 3.6, measuring 9 mm at the junction of the pancreatic head and body, suspicious for pancreatic adenocarcinoma.  No obvious extrapancreatic spread of tumor.  Cystic lesion along the posterior margin of the stomach and anterior margin of the pancreatic head favored pseudocyst.  Small amount of pelvic ascites. - MRI of the abdomen pancreatic protocol (08/03/2021): Similar appearance of the pancreas, with abrupt transition from dilated to decompressed duct in the pancreatic head with no evidence of underlying mass.  Pseudocyst enlarged position between pancreatic head and GE junction with significant mass effect upon the pyloric region and gastric outlet obstruction radiologically.  No evidence of abdominal metastatic  disease. - FOLFOX on 09/16/2021, FOLFIRINOX cycle 2 on 09/30/2021    Social/family history: - She is unaccompanied today. She lives at home by herself.  She is independent of ADLs and IADLs.  She did office work prior to retirement.  Non-smoker and nonalcoholic. - Brother (alcoholic) and maternal half uncle had pancreatitis.  No history of malignancy.  3. Pulmonary embolism: - She was diagnosed with pulmonary embolism in January 2023 and then was in hospital.  There does not appear to be any provoking factors for PE.  Reportedly Doppler was negative for DVT.  She was treated with Eliquis for 60 days, discontinued after that.  4. Anxiety/ Depression -we will defer management of meds to patient's psychiatrist  PLAN:  Stage I (T1 N0 MX) pancreatic adenocarcinoma: -Received cycle 1 of FOLFOX, 20% dose reduction on 09/16/2021 and tolerated reasonably well except slight nausea and fatigue. - Reviewed labs today which showed normal LFTs.  CBC was  grossly normal with mild leukopenia and ANC of 1.5. - We talked about introducing Irinotecan to the current regimen.  We discussed side effects in detail. - She will proceed with cycle 3 of FOLFIRINOX today with existing 20% dose reduction. - I will add pegylated G-CSF on day 3 because of leukopenia and neutropenia. - RTC 2 weeks for follow-up with repeat labs and treatment.    Orders placed this encounter:  No orders of the defined types were placed in this encounter.    Arnell Asal, NP, [place] [phone]

## 2021-10-08 NOTE — H&P (Signed)
GASTROENTEROLOGY PROCEDURE H&P NOTE   Primary Care Physician: Sherrilee Gilles, DO  HPI: Sheryl Bender is a 76 y.o. female who presents for EGD for stent pull of previous pancreatic pseudocyst gastrostomy.  Past Medical History:  Diagnosis Date   Anxiety    Arthritis    Depression    Dysrhythmia    hx palpitations greater than 5 yrs -neg echo, stress per pt ? where or dr   GERD (gastroesophageal reflux disease)    occ   Pancreatic adenocarcinoma ([place])    Pancreatic pseudocyst    Port-A-Cath in place 09/15/2021   Pulmonary embolism [facility])    September 2022   Past Surgical History:  Procedure Laterality Date   ANTERIOR LAT LUMBAR FUSION Right 12/16/2015   Procedure: RIGHT LUMBAR TWO-THREE, LUMBAR THREE-FOUR, LUMBAR FOUR-FIVE ANTEROLATERAL LUMBAR INTERBODY FUSION;  Surgeon: Erline Levine, MD;  Location: [place];  Service: Neurosurgery;  Laterality: Right;   BALLOON DILATION N/A 08/27/2021   Procedure: BALLOON DILATION;  Surgeon: Rush Landmark Telford Nab., MD;  Location: [facility];  Service: Gastroenterology;  Laterality: N/A;   BIOPSY  07/09/2021   Procedure: BIOPSY;  Surgeon: Rush Landmark Telford Nab., MD;  Location: [place];  Service: Gastroenterology;;   CYST GASTROSTOMY  08/27/2021   Procedure: CYST GASTROSTOMY;  Surgeon: Irving Copas., MD;  [facility];  Service: Gastroenterology;;   ESOPHAGOGASTRODUODENOSCOPY N/A 07/09/2021   Procedure: ESOPHAGOGASTRODUODENOSCOPY (EGD);  Surgeon: Irving Copas., MD;  Location: [place];  Service: Gastroenterology;  Laterality: N/A;   ESOPHAGOGASTRODUODENOSCOPY (EGD) WITH PROPOFOL N/A 08/27/2021   Procedure: ESOPHAGOGASTRODUODENOSCOPY (EGD) WITH PROPOFOL;  Surgeon: Rush Landmark Telford Nab., MD;  [facility];  Service: Gastroenterology;  Laterality: N/A;   EUS N/A 07/09/2021   Procedure: UPPER ENDOSCOPIC ULTRASOUND (EUS) RADIAL;  Surgeon: Irving Copas., MD;  Location: [place];  Service:  Gastroenterology;  Laterality: N/A;   EUS N/A 08/27/2021   Procedure: UPPER ENDOSCOPIC ULTRASOUND (EUS) LINEAR;  Surgeon: Irving Copas., MD;  [facility];  Service: Gastroenterology;  Laterality: N/A;   FINE NEEDLE ASPIRATION  07/09/2021   Procedure: FINE NEEDLE ASPIRATION (FNA) LINEAR;  Surgeon: Irving Copas., MD;  Location: [place];  Service: Gastroenterology;;   IR IMAGING GUIDED PORT INSERTION  09/14/2021   LUMBAR PERCUTANEOUS PEDICLE SCREW 3 LEVEL Bilateral 12/16/2015   Procedure: PERCUTANEOUS PEDICLE SCREWS BILATERALLY AT LUMBAR TWO-FIVE;  Surgeon: Erline Levine, MD;  Location: [place];  Service: Neurosurgery;  Laterality: Bilateral;   PANCREATIC STENT PLACEMENT  08/27/2021   Procedure: PANCREATIC STENT PLACEMENT;  Surgeon: Rush Landmark Telford Nab., MD;  [facility];  Service: Gastroenterology;;   TUBAL LIGATION  1974   Current Facility-Administered Medications  Medication Dose Route Frequency Provider Last Rate Last Admin   lactated ringers infusion   Intravenous Continuous Mansouraty, Telford Nab., MD 20 mL/hr at 10/08/21 0831 New Bag at 10/08/21 0831    Current Facility-Administered Medications:    lactated ringers infusion, , Intravenous, Continuous, Mansouraty, Telford Nab., MD, Last Rate: 20 mL/hr at 10/08/21 0831, New Bag at 10/08/21 0831 Allergies  Allergen Reactions   Other Hives and Other (See Comments)    Cherry wood just cut- "smelled it and broke out"; cannot tolerate ANY cherry fragrances, either      Cherry Hives   Wound Dressing Adhesive Rash and Other (See Comments)    Band-Aids = local reaction   Family History  Problem Relation Age of Onset   Pancreatitis Brother        alcoholic pancreatitis   Pancreatic cancer  Neg Hx    Colon cancer Neg Hx    Social History   Socioeconomic History   Marital status: Widowed    Spouse name: Not on file   Number of children: Not on file   Years of education: Not on file   Highest  education level: Not on file  Occupational History   Not on file  Tobacco Use   Smoking status: Never   Smokeless tobacco: Never  Substance and Sexual Activity   Alcohol use: No   Drug use: Never   Sexual activity: Not on file  Other Topics Concern   Not on file  Social History Narrative   Not on file   Social Determinants of Health   Financial Resource Strain: Not on file  Food Insecurity: Not on file  Transportation Needs: Not on file  Physical Activity: Not on file  Stress: Not on file  Social Connections: Not on file  Intimate Partner Violence: Not on file    Physical Exam: Today's Vitals   10/08/21 0824  BP: 127/83  Resp: 12  Temp: 97.7 F (36.5 C)  TempSrc: Temporal  SpO2: 94%  Weight: 51.7 kg  Height: '5\' 2"'$  (1.575 m)  PainSc: 0-No pain   Body mass index is 20.85 kg/m. GEN: NAD EYE: Sclerae anicteric ENT: MMM CV: Non-tachycardic GI: Soft, NT/ND NEURO:  Alert & Oriented x 3  Lab Results: No results for input(s): "WBC", "HGB", "HCT", "PLT" in the last 72 hours. BMET No results for input(s): "NA", "K", "CL", "CO2", "GLUCOSE", "BUN", "CREATININE", "CALCIUM" in the last 72 hours. LFT No results for input(s): "PROT", "ALBUMIN", "AST", "ALT", "ALKPHOS", "BILITOT", "BILIDIR", "IBILI" in the last 72 hours. PT/INR No results for input(s): "LABPROT", "INR" in the last 72 hours.   Impression / Plan: This is a 76 y.o.female who presents for EGD for stent pull of previous pancreatic pseudocyst gastrostomy.  The risks and benefits of endoscopic evaluation/treatment were discussed with the patient and/or family; these include but are not limited to the risk of perforation, infection, bleeding, missed lesions, lack of diagnosis, severe illness requiring hospitalization, as well as anesthesia and sedation related illnesses.  The patient's history has been reviewed, patient examined, no change in status, and deemed stable for procedure.  The patient and/or family is  agreeable to proceed.    Justice Britain, MD [facility] Office # [phone]

## 2021-10-11 ENCOUNTER — Encounter (HOSPITAL_COMMUNITY): Payer: Self-pay | Admitting: Gastroenterology

## 2021-10-13 ENCOUNTER — Inpatient Hospital Stay: Payer: Medicare Other

## 2021-10-13 ENCOUNTER — Inpatient Hospital Stay (HOSPITAL_BASED_OUTPATIENT_CLINIC_OR_DEPARTMENT_OTHER): Payer: Medicare Other | Admitting: Nurse Practitioner

## 2021-10-13 VITALS — BP 128/74 | HR 86 | Temp 97.7°F | Resp 18 | Ht 62.0 in | Wt 114.4 lb

## 2021-10-13 DIAGNOSIS — C250 Malignant neoplasm of head of pancreas: Secondary | ICD-10-CM

## 2021-10-13 DIAGNOSIS — F419 Anxiety disorder, unspecified: Secondary | ICD-10-CM

## 2021-10-13 DIAGNOSIS — Z5111 Encounter for antineoplastic chemotherapy: Secondary | ICD-10-CM | POA: Diagnosis not present

## 2021-10-13 DIAGNOSIS — Z95828 Presence of other vascular implants and grafts: Secondary | ICD-10-CM

## 2021-10-13 DIAGNOSIS — F32A Depression, unspecified: Secondary | ICD-10-CM | POA: Diagnosis not present

## 2021-10-13 LAB — CBC WITH DIFFERENTIAL/PLATELET
Abs Immature Granulocytes: 0.2 10*3/uL — ABNORMAL HIGH (ref 0.00–0.07)
Band Neutrophils: 3 %
Basophils Absolute: 0 10*3/uL (ref 0.0–0.1)
Basophils Relative: 0 %
Eosinophils Absolute: 0.1 10*3/uL (ref 0.0–0.5)
Eosinophils Relative: 1 %
HCT: 38.1 % (ref 36.0–46.0)
Hemoglobin: 12.4 g/dL (ref 12.0–15.0)
Lymphocytes Relative: 14 %
Lymphs Abs: 1.2 10*3/uL (ref 0.7–4.0)
MCH: 31.1 pg (ref 26.0–34.0)
MCHC: 32.5 g/dL (ref 30.0–36.0)
MCV: 95.5 fL (ref 80.0–100.0)
Metamyelocytes Relative: 2 %
Monocytes Absolute: 0.7 10*3/uL (ref 0.1–1.0)
Monocytes Relative: 8 %
Neutro Abs: 6.7 10*3/uL (ref 1.7–7.7)
Neutrophils Relative %: 72 %
Platelets: 160 10*3/uL (ref 150–400)
RBC: 3.99 MIL/uL (ref 3.87–5.11)
RDW: 16.4 % — ABNORMAL HIGH (ref 11.5–15.5)
WBC: 8.9 10*3/uL (ref 4.0–10.5)
nRBC: 0 % (ref 0.0–0.2)

## 2021-10-13 LAB — COMPREHENSIVE METABOLIC PANEL
ALT: 37 U/L (ref 0–44)
AST: 30 U/L (ref 15–41)
Albumin: 3.8 g/dL (ref 3.5–5.0)
Alkaline Phosphatase: 120 U/L (ref 38–126)
Anion gap: 9 (ref 5–15)
BUN: 28 mg/dL — ABNORMAL HIGH (ref 8–23)
CO2: 26 mmol/L (ref 22–32)
Calcium: 9.2 mg/dL (ref 8.9–10.3)
Chloride: 103 mmol/L (ref 98–111)
Creatinine, Ser: 0.9 mg/dL (ref 0.44–1.00)
GFR, Estimated: >60 mL/min (ref >60)
Glucose, Bld: 101 mg/dL — ABNORMAL HIGH (ref 70–99)
Potassium: 4.1 mmol/L (ref 3.5–5.1)
Sodium: 138 mmol/L (ref 135–145)
Total Bilirubin: 0.4 mg/dL (ref 0.3–1.2)
Total Protein: 7 g/dL (ref 6.5–8.1)

## 2021-10-13 LAB — MAGNESIUM: Magnesium: 2.3 mg/dL (ref 1.7–2.4)

## 2021-10-13 MED ORDER — SODIUM CHLORIDE 0.9 % IV SOLN
320.0000 mg/m2 | Freq: Once | INTRAVENOUS | Status: AC
  Administered 2021-10-13: 492 mg via INTRAVENOUS
  Filled 2021-10-13: qty 24.6

## 2021-10-13 MED ORDER — SODIUM CHLORIDE 0.9% FLUSH
10.0000 mL | INTRAVENOUS | Status: DC | PRN
  Administered 2021-10-13: 10 mL

## 2021-10-13 MED ORDER — SODIUM CHLORIDE 0.9 % IV SOLN
150.0000 mg | Freq: Once | INTRAVENOUS | Status: AC
  Administered 2021-10-13: 150 mg via INTRAVENOUS
  Filled 2021-10-13: qty 150

## 2021-10-13 MED ORDER — DEXTROSE 5 % IV SOLN: Freq: Once | INTRAVENOUS | Status: AC

## 2021-10-13 MED ORDER — SODIUM CHLORIDE 0.9 % IV SOLN
120.0000 mg/m2 | Freq: Once | INTRAVENOUS | Status: AC
  Administered 2021-10-13: 180 mg via INTRAVENOUS
  Filled 2021-10-13: qty 4

## 2021-10-13 MED ORDER — PALONOSETRON HCL INJECTION 0.25 MG/5ML
0.2500 mg | Freq: Once | INTRAVENOUS | Status: AC
  Administered 2021-10-13: 0.25 mg via INTRAVENOUS
  Filled 2021-10-13: qty 5

## 2021-10-13 MED ORDER — ATROPINE SULFATE 1 MG/ML IV SOLN
0.5000 mg | Freq: Once | INTRAVENOUS | Status: AC
  Administered 2021-10-13: 0.5 mg via INTRAVENOUS
  Filled 2021-10-13: qty 1

## 2021-10-13 MED ORDER — OXALIPLATIN CHEMO INJECTION 100 MG/20ML
66.0000 mg/m2 | Freq: Once | INTRAVENOUS | Status: AC
  Administered 2021-10-13: 100 mg via INTRAVENOUS
  Filled 2021-10-13: qty 20

## 2021-10-13 MED ORDER — SODIUM CHLORIDE 0.9 % IV SOLN
10.0000 mg | Freq: Once | INTRAVENOUS | Status: AC
  Administered 2021-10-13: 10 mg via INTRAVENOUS
  Filled 2021-10-13: qty 10

## 2021-10-13 MED ORDER — SODIUM CHLORIDE 0.9 % IV SOLN
1920.0000 mg/m2 | INTRAVENOUS | Status: DC
  Administered 2021-10-13: 2950 mg via INTRAVENOUS
  Filled 2021-10-13: qty 59

## 2021-10-13 NOTE — Progress Notes (Signed)
Chaplain engaged in an initial visit with Sheryl Bender.  Sheryl Bender shared that she had not been able to rest the night before due to some anxiety and fear.  She was afraid of missing the appointment and oversleeping.  Chaplain encouraged her to rest during her treatment.  Chaplain also offered support and community.     10/13/21 1100  Clinical Encounter Type  Visited With Patient  Visit Type Initial;Spiritual support

## 2021-10-15 ENCOUNTER — Inpatient Hospital Stay: Payer: Medicare Other

## 2021-10-15 ENCOUNTER — Inpatient Hospital Stay: Payer: Medicare Other | Admitting: Dietician

## 2021-10-15 ENCOUNTER — Other Ambulatory Visit: Payer: Medicare Other

## 2021-10-15 VITALS — BP 102/52 | HR 80 | Temp 98.6°F | Resp 18

## 2021-10-15 DIAGNOSIS — C250 Malignant neoplasm of head of pancreas: Secondary | ICD-10-CM

## 2021-10-15 DIAGNOSIS — Z5111 Encounter for antineoplastic chemotherapy: Secondary | ICD-10-CM | POA: Diagnosis not present

## 2021-10-15 DIAGNOSIS — Z95828 Presence of other vascular implants and grafts: Secondary | ICD-10-CM

## 2021-10-15 MED ORDER — PEGFILGRASTIM-JMDB 6 MG/0.6ML ~~LOC~~ SOSY
6.0000 mg | PREFILLED_SYRINGE | Freq: Once | SUBCUTANEOUS | Status: AC
  Administered 2021-10-15: 6 mg via SUBCUTANEOUS
  Filled 2021-10-15: qty 0.6

## 2021-10-15 MED ORDER — HEPARIN SOD (PORK) LOCK FLUSH 100 UNIT/ML IV SOLN
500.0000 [IU] | Freq: Once | INTRAVENOUS | Status: AC | PRN
  Administered 2021-10-15: 500 [IU]

## 2021-10-15 MED ORDER — SODIUM CHLORIDE 0.9% FLUSH
10.0000 mL | INTRAVENOUS | Status: DC | PRN
  Administered 2021-10-15: 10 mL

## 2021-10-15 NOTE — Progress Notes (Signed)
Jenah Vanasten presents to have home infusion pump d/c'd and for port-a-cath deaccess with flush.  Portacath located right chest wall accessed with  H 20 needle.  Good blood return present. Portacath flushed with NS and 500U/40m Heparin, and needle removed intact.  Procedure tolerated well and without incident.   Fulphila injection was given per orders as well.   Vitals stable and discharged home from clinic ambulatory. Follow up as scheduled.

## 2021-10-15 NOTE — Patient Instructions (Signed)
[facility]  Discharge Instructions: Thank you for choosing [facility] to provide your oncology and hematology care.  If you have a lab appointment with the [facility], please come in thru the Main Entrance and check in at the main information desk.  Wear comfortable clothing and clothing appropriate for easy access to any Portacath or PICC line.   We strive to give you quality time with your provider. You may need to reschedule your appointment if you arrive late (15 or more minutes).  Arriving late affects you and other patients whose appointments are after yours.  Also, if you miss three or more appointments without notifying the office, you may be dismissed from the clinic at the provider's discretion.      For prescription refill requests, have your pharmacy contact our office and allow 72 hours for refills to be completed.    Pump d/c, and injection today.    To help prevent nausea and vomiting after your treatment, we encourage you to take your nausea medication as directed.  BELOW ARE SYMPTOMS THAT SHOULD BE REPORTED IMMEDIATELY: *FEVER GREATER THAN 100.4 F (38 C) OR HIGHER *CHILLS OR SWEATING *NAUSEA AND VOMITING THAT IS NOT CONTROLLED WITH YOUR NAUSEA MEDICATION *UNUSUAL SHORTNESS OF BREATH *UNUSUAL BRUISING OR BLEEDING *URINARY PROBLEMS (pain or burning when urinating, or frequent urination) *BOWEL PROBLEMS (unusual diarrhea, constipation, pain near the anus) TENDERNESS IN MOUTH AND THROAT WITH OR WITHOUT PRESENCE OF ULCERS (sore throat, sores in mouth, or a toothache) UNUSUAL RASH, SWELLING OR PAIN  UNUSUAL VAGINAL DISCHARGE OR ITCHING   Items with * indicate a potential emergency and should be followed up as soon as possible or go to the Emergency Department if any problems should occur.  Please show the CHEMOTHERAPY ALERT CARD or IMMUNOTHERAPY ALERT CARD at check-in to the Emergency Department and triage nurse.  Should you have  questions after your visit or need to cancel or reschedule your appointment, please contact [facility] [phone]  and follow the prompts.  Office hours are 8:00 a.m. to 4:30 p.m. Monday - Friday. Please note that voicemails left after 4:00 p.m. may not be returned until the following business day.  We are closed weekends and major holidays. You have access to a nurse at all times for urgent questions. Please call the main number to the clinic [phone] and follow the prompts.  For any non-urgent questions, you may also contact your provider using MyChart. We now offer e-Visits for anyone 74 and older to request care online for non-urgent symptoms. For details visit [email or web address].   Also download the MyChart app! Go to the app store, search "MyChart", open the app, select [facility], and log in with your MyChart username and password.  Masks are optional in the cancer centers. If you would like for your care team to wear a mask while they are taking care of you, please let them know. You may have one support person who is at least 78 years old accompany you for your appointments.

## 2021-10-15 NOTE — Progress Notes (Signed)
Nutrition Assessment   Reason for Assessment: MST   ASSESSMENT: 76 year old female with stage I pancreatic cancer. She is receiving modified FOLFIRINOX q14d.   Past medical history includes GERD, acute pancreatitis with pseudocyst, hypokalemia, depression, anxiety, IDA, constipation  Met with patient during pump disconnect. She reports unable to sleep the last 2 nights due to pump. Patient states she is exhausted. Patient denies nausea, vomiting. She has intermittent constipation. Patient is taking linzess + miralax. She endorses cold sensitivity lasting longer in extremities. Patient avoiding cold drinks. Says her appetite is not good, but forcing herself to eat. Patient lives alone and cooking for one person is hard. Patient is unsure what to eat. Reports low-fat diet order per GI. She reports having anxiety/depression which has been difficult to manage recently. Patient no longer feels comfortable driving, states she is too anxious. Patient is followed by psychiatry.   Nutrition Focused Physical Exam:   Orbital Region: mild Buccal Region: moderate Upper Arm Region: severe Thoracic and Lumbar Region: uta  Temple Region: mild Clavicle Bone Region: moderate Shoulder and Acromion Bone Region: moderate Scapular Bone Region: uta Dorsal Hand: mild Patellar Region: uta Anterior Thigh Region: uta Posterior Calf Region: uta Edema (RD assessment): uta Hair: reviewed Eyes: reviewed Mouth: reviewed Skin: reviewed Nails: reviewed  Medications: D3, buspar, lexapro, linzess, toprol, prilosec, zofran, oxycodone, compazine, seroquel, B12   Labs: 9/19 - BUN 28, glucose 101   Anthropometrics: Weights have decreased 14 lbs (11%) from usual weight in 3 months which is significant  Height: 5'2" Weight: 114 lb 6.4 oz  UBW: 128 lb (6/19) BMI: 20.92  NUTRITION DIAGNOSIS: Unintentional weight loss related to pancreatic cancer as evidenced by low fat diet, 11% wt loss in 3 months which is  significant   INTERVENTION:  Educated on small frequent meals and snacks with adequate calories and protein Discussed types of fat, encouraged limited intake of saturated fats (avoid fried, greasy, fatty meats)  Continue drinking 2 Boost Plus/equivalent - coupons provided Discussed strategies for constipation - handout with tips provided Snack ideas + high protein foods provided  Supportive listening and encouragement   MONITORING, EVALUATION, GOAL: Patient will tolerate increased calories and protein to minimize further weight loss   Next Visit: Monday October 16 during infusion

## 2021-10-28 ENCOUNTER — Inpatient Hospital Stay: Payer: Medicare Other

## 2021-10-28 ENCOUNTER — Inpatient Hospital Stay: Payer: Medicare Other | Attending: Hematology

## 2021-10-28 ENCOUNTER — Inpatient Hospital Stay (HOSPITAL_BASED_OUTPATIENT_CLINIC_OR_DEPARTMENT_OTHER): Payer: Medicare Other | Admitting: Hematology

## 2021-10-28 VITALS — BP 127/71 | HR 84 | Temp 98.4°F | Resp 18

## 2021-10-28 DIAGNOSIS — C253 Malignant neoplasm of pancreatic duct: Secondary | ICD-10-CM | POA: Diagnosis present

## 2021-10-28 DIAGNOSIS — Z5111 Encounter for antineoplastic chemotherapy: Secondary | ICD-10-CM | POA: Diagnosis present

## 2021-10-28 DIAGNOSIS — C250 Malignant neoplasm of head of pancreas: Secondary | ICD-10-CM

## 2021-10-28 DIAGNOSIS — Z5189 Encounter for other specified aftercare: Secondary | ICD-10-CM | POA: Insufficient documentation

## 2021-10-28 DIAGNOSIS — Z95828 Presence of other vascular implants and grafts: Secondary | ICD-10-CM

## 2021-10-28 LAB — CBC WITH DIFFERENTIAL/PLATELET
Abs Immature Granulocytes: 0.54 10*3/uL — ABNORMAL HIGH (ref 0.00–0.07)
Basophils Absolute: 0.1 10*3/uL (ref 0.0–0.1)
Basophils Relative: 0 %
Eosinophils Absolute: 0.1 10*3/uL (ref 0.0–0.5)
Eosinophils Relative: 1 %
HCT: 37.5 % (ref 36.0–46.0)
Hemoglobin: 12.1 g/dL (ref 12.0–15.0)
Immature Granulocytes: 5 %
Lymphocytes Relative: 13 %
Lymphs Abs: 1.5 10*3/uL (ref 0.7–4.0)
MCH: 31.6 pg (ref 26.0–34.0)
MCHC: 32.3 g/dL (ref 30.0–36.0)
MCV: 97.9 fL (ref 80.0–100.0)
Monocytes Absolute: 0.9 10*3/uL (ref 0.1–1.0)
Monocytes Relative: 8 %
Neutro Abs: 8.7 10*3/uL — ABNORMAL HIGH (ref 1.7–7.7)
Neutrophils Relative %: 73 %
Platelets: 141 10*3/uL — ABNORMAL LOW (ref 150–400)
RBC: 3.83 MIL/uL — ABNORMAL LOW (ref 3.87–5.11)
RDW: 18.6 % — ABNORMAL HIGH (ref 11.5–15.5)
WBC: 11.7 10*3/uL — ABNORMAL HIGH (ref 4.0–10.5)
nRBC: 0 % (ref 0.0–0.2)

## 2021-10-28 LAB — COMPREHENSIVE METABOLIC PANEL
ALT: 68 U/L — ABNORMAL HIGH (ref 0–44)
AST: 45 U/L — ABNORMAL HIGH (ref 15–41)
Albumin: 3.8 g/dL (ref 3.5–5.0)
Alkaline Phosphatase: 138 U/L — ABNORMAL HIGH (ref 38–126)
Anion gap: 8 (ref 5–15)
BUN: 30 mg/dL — ABNORMAL HIGH (ref 8–23)
CO2: 26 mmol/L (ref 22–32)
Calcium: 9.2 mg/dL (ref 8.9–10.3)
Chloride: 105 mmol/L (ref 98–111)
Creatinine, Ser: 0.83 mg/dL (ref 0.44–1.00)
GFR, Estimated: >60 mL/min (ref >60)
Glucose, Bld: 111 mg/dL — ABNORMAL HIGH (ref 70–99)
Potassium: 3.6 mmol/L (ref 3.5–5.1)
Sodium: 139 mmol/L (ref 135–145)
Total Bilirubin: 0.3 mg/dL (ref 0.3–1.2)
Total Protein: 6.8 g/dL (ref 6.5–8.1)

## 2021-10-28 LAB — MAGNESIUM: Magnesium: 2.4 mg/dL (ref 1.7–2.4)

## 2021-10-28 MED ORDER — SODIUM CHLORIDE 0.9 % IV SOLN
10.0000 mg | Freq: Once | INTRAVENOUS | Status: AC
  Administered 2021-10-28: 10 mg via INTRAVENOUS
  Filled 2021-10-28: qty 1

## 2021-10-28 MED ORDER — SODIUM CHLORIDE 0.9% FLUSH: 10.0000 mL | INTRAVENOUS | Status: DC | PRN

## 2021-10-28 MED ORDER — PALONOSETRON HCL INJECTION 0.25 MG/5ML
0.2500 mg | Freq: Once | INTRAVENOUS | Status: AC
  Administered 2021-10-28: 0.25 mg via INTRAVENOUS
  Filled 2021-10-28: qty 5

## 2021-10-28 MED ORDER — ATROPINE SULFATE 1 MG/ML IV SOLN
0.5000 mg | Freq: Once | INTRAVENOUS | Status: AC
  Administered 2021-10-28: 0.5 mg via INTRAVENOUS
  Filled 2021-10-28: qty 1

## 2021-10-28 MED ORDER — OXALIPLATIN CHEMO INJECTION 100 MG/20ML
66.0000 mg/m2 | Freq: Once | INTRAVENOUS | Status: AC
  Administered 2021-10-28: 100 mg via INTRAVENOUS
  Filled 2021-10-28: qty 20

## 2021-10-28 MED ORDER — SODIUM CHLORIDE 0.9 % IV SOLN
150.0000 mg | Freq: Once | INTRAVENOUS | Status: AC
  Administered 2021-10-28: 150 mg via INTRAVENOUS
  Filled 2021-10-28: qty 5

## 2021-10-28 MED ORDER — SODIUM CHLORIDE 0.9 % IV SOLN
320.0000 mg/m2 | Freq: Once | INTRAVENOUS | Status: AC
  Administered 2021-10-28: 492 mg via INTRAVENOUS
  Filled 2021-10-28: qty 24.6

## 2021-10-28 MED ORDER — SODIUM CHLORIDE 0.9 % IV SOLN
1920.0000 mg/m2 | INTRAVENOUS | Status: DC
  Administered 2021-10-28: 2950 mg via INTRAVENOUS
  Filled 2021-10-28: qty 59

## 2021-10-28 MED ORDER — SODIUM CHLORIDE 0.9 % IV SOLN
120.0000 mg/m2 | Freq: Once | INTRAVENOUS | Status: AC
  Administered 2021-10-28: 180 mg via INTRAVENOUS
  Filled 2021-10-28: qty 5

## 2021-10-28 MED ORDER — DEXTROSE 5 % IV SOLN: Freq: Once | INTRAVENOUS | Status: AC

## 2021-10-28 NOTE — Patient Instructions (Addendum)
[facility] at [facility] Discharge Instructions   You were seen and examined today by Dr. Delton Coombes.  He reviewed the results of your lab work which are normal/stable.   We will proceed with the 3rd cycle of treatment today. You will receive a total of 6 cycles. We will repeat a scan after cycle 6.   Return as scheduled in 2 weeks.    Thank you for choosing [facility] at [facility] to provide your oncology and hematology care.  To afford each patient quality time with our provider, please arrive at least 15 minutes before your scheduled appointment time.   If you have a lab appointment with the [facility] please come in thru the Main Entrance and check in at the main information desk.  You need to re-schedule your appointment should you arrive 10 or more minutes late.  We strive to give you quality time with our providers, and arriving late affects you and other patients whose appointments are after yours.  Also, if you no show three or more times for appointments you may be dismissed from the clinic at the providers discretion.     Again, thank you for choosing [facility].  Our hope is that these requests will decrease the amount of time that you wait before being seen by our physicians.       _____________________________________________________________  Should you have questions after your visit to [facility], please contact our office at [phone] and follow the prompts.  Our office hours are 8:00 a.m. and 4:30 p.m. Monday - Friday.  Please note that voicemails left after 4:00 p.m. may not be returned until the following business day.  We are closed weekends and major holidays.  You do have access to a nurse 24-7, just call the main number to the clinic [phone] and do not press any options, hold on the line and a nurse will answer the phone.    For prescription refill requests, have your pharmacy  contact our office and allow 72 hours.    Due to Covid, you will need to wear a mask upon entering the hospital. If you do not have a mask, a mask will be given to you at the Main Entrance upon arrival. For doctor visits, patients may have 1 support person age 14 or older with them. For treatment visits, patients can not have anyone with them due to social distancing guidelines and our immunocompromised population.

## 2021-10-28 NOTE — Progress Notes (Signed)
Patients port flushed without difficulty.  Good blood return noted with no bruising or swelling noted at site.  Patient remains accessed for chemotherapy treatment.  

## 2021-10-28 NOTE — Progress Notes (Signed)
Per Dr Delton Coombes -   Please give chemo at previous doses:  Oxaliplatin 65 mg/m2 IVPB x 1 Irinotecan 120 mg/m2 IVPB x 1 Leucovorin 320 mg/m2 IVPB x 1 Fluorouracil 1920 mg/m2 CIV x 1  T.O. Dr Rhys Martini, PharmD

## 2021-10-28 NOTE — Progress Notes (Signed)
Patient has been examined by Dr. Katragadda, and vital signs and labs have been reviewed. ANC, Creatinine, LFTs, hemoglobin, and platelets are within treatment parameters per M.D. - pt may proceed with treatment.  Primary RN and pharmacy notified.  

## 2021-10-28 NOTE — Progress Notes (Signed)
Ok for treatment verbal order Dr. Delton Coombes.   Patient tolerated chemotherapy with no complaints voiced.  Side effects with management reviewed with understanding verbalized.  Port site clean and dry with no bruising or swelling noted at site.  Good blood return noted before and after administration of chemotherapy.  Chemotherapy pump running with no alarms noted.  Patient left in satisfactory condition with VSS and no s/s of distress noted.

## 2021-10-28 NOTE — Patient Instructions (Signed)
[facility]  Discharge Instructions: Thank you for choosing [facility] to provide your oncology and hematology care.  If you have a lab appointment with the [facility], please come in thru the Main Entrance and check in at the main information desk.  Wear comfortable clothing and clothing appropriate for easy access to any Portacath or PICC line.   We strive to give you quality time with your provider. You may need to reschedule your appointment if you arrive late (15 or more minutes).  Arriving late affects you and other patients whose appointments are after yours.  Also, if you miss three or more appointments without notifying the office, you may be dismissed from the clinic at the provider's discretion.      For prescription refill requests, have your pharmacy contact our office and allow 72 hours for refills to be completed.    Today you received the following chemotherapy and/or immunotherapy agents oxaliplatin, leucovorin, irinotecan and adrucil.        To help prevent nausea and vomiting after your treatment, we encourage you to take your nausea medication as directed.  BELOW ARE SYMPTOMS THAT SHOULD BE REPORTED IMMEDIATELY: *FEVER GREATER THAN 100.4 F (38 C) OR HIGHER *CHILLS OR SWEATING *NAUSEA AND VOMITING THAT IS NOT CONTROLLED WITH YOUR NAUSEA MEDICATION *UNUSUAL SHORTNESS OF BREATH *UNUSUAL BRUISING OR BLEEDING *URINARY PROBLEMS (pain or burning when urinating, or frequent urination) *BOWEL PROBLEMS (unusual diarrhea, constipation, pain near the anus) TENDERNESS IN MOUTH AND THROAT WITH OR WITHOUT PRESENCE OF ULCERS (sore throat, sores in mouth, or a toothache) UNUSUAL RASH, SWELLING OR PAIN  UNUSUAL VAGINAL DISCHARGE OR ITCHING   Items with * indicate a potential emergency and should be followed up as soon as possible or go to the Emergency Department if any problems should occur.  Please show the CHEMOTHERAPY ALERT CARD or  IMMUNOTHERAPY ALERT CARD at check-in to the Emergency Department and triage nurse.  Should you have questions after your visit or need to cancel or reschedule your appointment, please contact [facility] [phone]  and follow the prompts.  Office hours are 8:00 a.m. to 4:30 p.m. Monday - Friday. Please note that voicemails left after 4:00 p.m. may not be returned until the following business day.  We are closed weekends and major holidays. You have access to a nurse at all times for urgent questions. Please call the main number to the clinic [phone] and follow the prompts.  For any non-urgent questions, you may also contact your provider using MyChart. We now offer e-Visits for anyone 62 and older to request care online for non-urgent symptoms. For details visit [email or web address].   Also download the MyChart app! Go to the app store, search "MyChart", open the app, select [facility], and log in with your MyChart username and password.  Masks are optional in the cancer centers. If you would like for your care team to wear a mask while they are taking care of you, please let them know. You may have one support person who is at least 78 years old accompany you for your appointments.

## 2021-10-30 ENCOUNTER — Inpatient Hospital Stay: Payer: Medicare Other

## 2021-10-30 VITALS — BP 124/67 | HR 72 | Temp 97.6°F | Resp 18

## 2021-10-30 DIAGNOSIS — C250 Malignant neoplasm of head of pancreas: Secondary | ICD-10-CM

## 2021-10-30 DIAGNOSIS — Z5111 Encounter for antineoplastic chemotherapy: Secondary | ICD-10-CM | POA: Diagnosis not present

## 2021-10-30 DIAGNOSIS — Z95828 Presence of other vascular implants and grafts: Secondary | ICD-10-CM

## 2021-10-30 MED ORDER — HEPARIN SOD (PORK) LOCK FLUSH 100 UNIT/ML IV SOLN
500.0000 [IU] | Freq: Once | INTRAVENOUS | Status: AC | PRN
  Administered 2021-10-30: 500 [IU]

## 2021-10-30 MED ORDER — SODIUM CHLORIDE 0.9% FLUSH
10.0000 mL | INTRAVENOUS | Status: DC | PRN
  Administered 2021-10-30: 10 mL

## 2021-10-30 MED ORDER — PEGFILGRASTIM-JMDB 6 MG/0.6ML ~~LOC~~ SOSY
6.0000 mg | PREFILLED_SYRINGE | Freq: Once | SUBCUTANEOUS | Status: AC
  Administered 2021-10-30: 6 mg via SUBCUTANEOUS
  Filled 2021-10-30: qty 0.6

## 2021-10-30 NOTE — Patient Instructions (Signed)
[facility]  Discharge Instructions: Thank you for choosing [facility] to provide your oncology and hematology care.  If you have a lab appointment with the [facility], please come in thru the Main Entrance and check in at the main information desk.  Wear comfortable clothing and clothing appropriate for easy access to any Portacath or PICC line.   We strive to give you quality time with your provider. You may need to reschedule your appointment if you arrive late (15 or more minutes).  Arriving late affects you and other patients whose appointments are after yours.  Also, if you miss three or more appointments without notifying the office, you may be dismissed from the clinic at the provider's discretion.      For prescription refill requests, have your pharmacy contact our office and allow 72 hours for refills to be completed.    Today you received the following chemotherapy and/or immunotherapy agents Pump stop fulphila      To help prevent nausea and vomiting after your treatment, we encourage you to take your nausea medication as directed.  BELOW ARE SYMPTOMS THAT SHOULD BE REPORTED IMMEDIATELY: *FEVER GREATER THAN 100.4 F (38 C) OR HIGHER *CHILLS OR SWEATING *NAUSEA AND VOMITING THAT IS NOT CONTROLLED WITH YOUR NAUSEA MEDICATION *UNUSUAL SHORTNESS OF BREATH *UNUSUAL BRUISING OR BLEEDING *URINARY PROBLEMS (pain or burning when urinating, or frequent urination) *BOWEL PROBLEMS (unusual diarrhea, constipation, pain near the anus) TENDERNESS IN MOUTH AND THROAT WITH OR WITHOUT PRESENCE OF ULCERS (sore throat, sores in mouth, or a toothache) UNUSUAL RASH, SWELLING OR PAIN  UNUSUAL VAGINAL DISCHARGE OR ITCHING   Items with * indicate a potential emergency and should be followed up as soon as possible or go to the Emergency Department if any problems should occur.  Please show the CHEMOTHERAPY ALERT CARD or IMMUNOTHERAPY ALERT CARD at check-in to  the Emergency Department and triage nurse.  Should you have questions after your visit or need to cancel or reschedule your appointment, please contact [facility] [phone]  and follow the prompts.  Office hours are 8:00 a.m. to 4:30 p.m. Monday - Friday. Please note that voicemails left after 4:00 p.m. may not be returned until the following business day.  We are closed weekends and major holidays. You have access to a nurse at all times for urgent questions. Please call the main number to the clinic [phone] and follow the prompts.  For any non-urgent questions, you may also contact your provider using MyChart. We now offer e-Visits for anyone 36 and older to request care online for non-urgent symptoms. For details visit [email or web address].   Also download the MyChart app! Go to the app store, search "MyChart", open the app, select [facility], and log in with your MyChart username and password.  Masks are optional in the cancer centers. If you would like for your care team to wear a mask while they are taking care of you, please let them know. You may have one support person who is at least 78 years old accompany you for your appointments.

## 2021-10-30 NOTE — Progress Notes (Signed)
Patient presents today for 5FU pump stop and disconnection after 46 hour continous infusion.   5FU pump deaccessed.  Patients port flushed without difficulty.  Good blood return noted with no bruising or swelling noted at site.  needle removed intact.  Band aid applied.  Fulphila administration without incident; injection site WNL; see MAR for injection details.  Patient tolerated procedure well and without incident.  No questions or complaints noted at this time. VSS with discharge and left in satisfactory condition via ambulatory with no s/s of distress noted.

## 2021-11-06 ENCOUNTER — Encounter: Payer: Self-pay | Admitting: Hematology

## 2021-11-06 NOTE — Progress Notes (Signed)
[place], [place] [address]   CLINIC:  Medical Oncology/Hematology  PCP:  Sherrilee Gilles, DO [address] [place] [address] [phone]   REASON FOR VISIT:  Follow-up for pancreatic cancer  PRIOR THERAPY: none  NGS Results: not done  CURRENT THERAPY: under work-up  BRIEF ONCOLOGIC HISTORY:  Oncology History  Malignant neoplasm of pancreas ([place])  07/21/2021 Initial Diagnosis   Malignant neoplasm of pancreas (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 VUS in ATM called c.4367G>A and in [facility] called c.238G>A also identified. The remainder of testing was negative/normal. The report date is 09/20/2021.  The CancerNext-Expanded + RNAinsight gene panel offered by [employer] and includes sequencing and rearrangement analysis for the following 77 genes: IP, ALK, APC*, ATM*, AXIN2, BAP1, BARD1, BLM, BMPR1A, BRCA1*, BRCA2*, BRIP1*, CDC73, CDH1*,CDK4, CDKN1B, CDKN2A, CHEK2*, CTNNA1, DICER1, FANCC, FH, FLCN, GALNT12, KIF1B, LZTR1, MAX, MEN1, MET, MLH1*, MSH2*, MSH3, MSH6*, MUTYH*, NBN, NF1*, NF2, NTHL1, PALB2*, PHOX2B, PMS2*, POT1, PRKAR1A, PTCH1, PTEN*, RAD51C*, RAD51D*,RB1, RECQL, RET, SDHA, SDHAF2, SDHB, SDHC, SDHD, SMAD4, SMARCA4, SMARCB1, SMARCE1, STK11, SUFU, TMEM127, TP53*,TSC1, TSC2, VHL and XRCC2 (sequencing and deletion/duplication); EGFR, EGLN1, HOXB13, KIT, MITF, PDGFRA, POLD1 and POLE (sequencing only); EPCAM and GREM1 (deletion/duplication only).     CANCER STAGING:  Cancer Staging  Malignant neoplasm of pancreas [facility]) Staging form: Exocrine Pancreas, AJCC 8th Edition - Clinical stage from 07/29/2021: Stage IA  (cT1, cN0, cM0) - Unsigned   INTERVAL HISTORY:  Ms. Shatiqua Heroux, a 76 y.o. female, seen for follow-up of pancreatic cancer and toxicity assessment prior to cycle 4 of chemotherapy.  Cycle 3 of chemotherapy was on 10/13/2021.  She reported cold sensitivity lasting for 10 days.  Denied any tingling or numbness next of days.  Weight is stable.  REVIEW OF SYSTEMS:  Review of Systems  Constitutional:  Negative for appetite change and fatigue.  Respiratory:  Negative for shortness of breath.   Gastrointestinal:  Positive for constipation and diarrhea.  Neurological:  Positive for dizziness.  Psychiatric/Behavioral:  Positive for depression.   All other systems reviewed and are negative.   PAST MEDICAL/SURGICAL HISTORY:  Past Medical History:  Diagnosis Date   Anxiety    Arthritis    Depression    Dysrhythmia    hx palpitations greater than 5 yrs -neg echo, stress per pt ? where or dr   GERD (gastroesophageal reflux disease)    occ   Pancreatic adenocarcinoma ([place])    Pancreatic pseudocyst    Port-A-Cath in place 09/15/2021   Pulmonary embolism [facility])    September 2022   Past Surgical History:  Procedure Laterality Date   ANTERIOR LAT LUMBAR FUSION Right 12/16/2015   Procedure: RIGHT LUMBAR TWO-THREE, LUMBAR THREE-FOUR, LUMBAR FOUR-FIVE ANTEROLATERAL LUMBAR INTERBODY FUSION;  Surgeon: Erline Levine, MD;  Location: [facility];  Service: Neurosurgery;  Laterality: Right;   BALLOON DILATION N/A 08/27/2021   Procedure: BALLOON DILATION;  Surgeon: Rush Landmark Telford Nab., MD;  Location: [facility];  Service: Gastroenterology;  Laterality: N/A;   BIOPSY  07/09/2021   Procedure: BIOPSY;  Surgeon: Rush Landmark Telford Nab., MD;  Location: [facility];  Service: Gastroenterology;;  CYST GASTROSTOMY  08/27/2021   Procedure: CYST GASTROSTOMY;  Surgeon: Rush Landmark Telford Nab., MD;  Location: [facility];  Service: Gastroenterology;;   ESOPHAGOGASTRODUODENOSCOPY N/A 07/09/2021   Procedure:  ESOPHAGOGASTRODUODENOSCOPY (EGD);  Surgeon: Irving Copas., MD;  Location: [facility];  Service: Gastroenterology;  Laterality: N/A;   ESOPHAGOGASTRODUODENOSCOPY (EGD) WITH PROPOFOL N/A 08/27/2021   Procedure: ESOPHAGOGASTRODUODENOSCOPY (EGD) WITH PROPOFOL;  Surgeon: Rush Landmark Telford Nab., MD;  Location: [facility];  Service: Gastroenterology;  Laterality: N/A;   ESOPHAGOGASTRODUODENOSCOPY (EGD) WITH PROPOFOL N/A 10/08/2021   Procedure: ESOPHAGOGASTRODUODENOSCOPY (EGD) WITH PROPOFOL;  Surgeon: Rush Landmark Telford Nab., MD;  Location: [facility];  Service: Gastroenterology;  Laterality: N/A;   EUS N/A 07/09/2021   Procedure: UPPER ENDOSCOPIC ULTRASOUND (EUS) RADIAL;  Surgeon: Irving Copas., MD;  Location: [facility];  Service: Gastroenterology;  Laterality: N/A;   EUS N/A 08/27/2021   Procedure: UPPER ENDOSCOPIC ULTRASOUND (EUS) LINEAR;  Surgeon: Irving Copas., MD;  Location: [facility];  Service: Gastroenterology;  Laterality: N/A;   FINE NEEDLE ASPIRATION  07/09/2021   Procedure: FINE NEEDLE ASPIRATION (FNA) LINEAR;  Surgeon: Irving Copas., MD;  Location: [facility];  Service: Gastroenterology;;   IR IMAGING GUIDED PORT INSERTION  09/14/2021   LUMBAR PERCUTANEOUS PEDICLE SCREW 3 LEVEL Bilateral 12/16/2015   Procedure: PERCUTANEOUS PEDICLE SCREWS BILATERALLY AT LUMBAR TWO-FIVE;  Surgeon: Erline Levine, MD;  Location: [facility];  Service: Neurosurgery;  Laterality: Bilateral;   PANCREATIC STENT PLACEMENT  08/27/2021   Procedure: PANCREATIC STENT PLACEMENT;  Surgeon: Rush Landmark Telford Nab., MD;  Location: [facility];  Service: Gastroenterology;;   Lavell Islam REMOVAL  10/08/2021   Procedure: STENT REMOVAL;  Surgeon: Irving Copas., MD;  Location: [facility];  Service: Gastroenterology;;   TUBAL LIGATION  1974    SOCIAL HISTORY:  Social History   Socioeconomic History   Marital status: Widowed    Spouse name: Not on file   Number of children: Not on  file   Years of education: Not on file   Highest education level: Not on file  Occupational History   Not on file  Tobacco Use   Smoking status: Never   Smokeless tobacco: Never  Substance and Sexual Activity   Alcohol use: No   Drug use: Never   Sexual activity: Not on file  Other Topics Concern   Not on file  Social History Narrative   Not on file   Social Determinants of Health   Financial Resource Strain: Not on file  Food Insecurity: Not on file  Transportation Needs: Not on file  Physical Activity: Not on file  Stress: Not on file  Social Connections: Not on file  Intimate Partner Violence: Not on file    FAMILY HISTORY:  Family History  Problem Relation Age of Onset   Pancreatitis Brother        alcoholic pancreatitis   Pancreatic cancer Neg Hx    Colon cancer Neg Hx     CURRENT MEDICATIONS:  Current Outpatient Medications  Medication Sig Dispense Refill   acetaminophen (TYLENOL) 500 MG tablet Take 2 tablets (1,000 mg total) by mouth every 8 (eight) hours as needed for moderate pain, headache or fever. (Patient taking differently: Take 250-1,000 mg by mouth every 8 (eight) hours as needed for moderate pain, headache or fever.)     alum & mag hydroxide-simeth (MAALOX/MYLANTA) 200-200-20 MG/5ML suspension Take 30 mLs by mouth every 4 (four) hours as needed for indigestion. 355 mL 0   Aromatic Inhalants (VICKS VAPOINHALER) INHA Inhale 1 puff into the  lungs daily as needed (congestion).     ascorbic acid (VITAMIN C) 500 MG tablet Take 500 mg by mouth in the morning.     buPROPion (WELLBUTRIN XL) 300 MG 24 hr tablet Take 300 mg by mouth daily after breakfast.     busPIRone (BUSPAR) 10 MG tablet Take 20 mg by mouth in the morning and at bedtime.     calcium carbonate (TUMS - DOSED IN MG ELEMENTAL CALCIUM) 500 MG chewable tablet Chew 1 tablet (200 mg of elemental calcium total) by mouth 2 (two) times daily as needed for indigestion or heartburn. (Patient taking  differently: Chew 1-2 tablets by mouth 2 (two) times daily as needed for indigestion or heartburn.)     Cholecalciferol (VITAMIN D3) 125 MCG (5000 UT) CAPS Take 5,000 Units by mouth every other day.     ciclopirox (LOPROX) 0.77 % cream Apply 1 application  topically at bedtime. Apply to the fingernails     escitalopram (LEXAPRO) 20 MG tablet Take 20 mg by mouth at bedtime.     fluorouracil CALGB 16109 1,920 mg/m2 in sodium chloride 0.9 % 150 mL Inject 1,920 mg/m2 into the vein over 48 hr.     lactose free nutrition (BOOST) LIQD Take 237 mLs by mouth 2 (two) times daily between meals.     lactulose (CHRONULAC) 10 GM/15ML solution Take 15 mLs (10 g total) by mouth at bedtime. Take 15 ml at bedtime every night to assist with bowel movements. If bowel movement has not occurred in 3-4 days take 15 ml every 3 hours until bowel movement has occurred. 236 mL 0   LEUCOVORIN CALCIUM IV Inject into the vein every 14 (fourteen) days.     lidocaine-prilocaine (EMLA) cream Apply a small amount to port a cath site (do not rub in) and cover with plastic wrap 1 hour prior to infusion appointments 30 g 3   linaclotide (LINZESS) 72 MCG capsule Take 1 capsule (72 mcg total) by mouth daily before breakfast. 30 capsule 12   metoprolol succinate (TOPROL-XL) 25 MG 24 hr tablet Take 25 mg by mouth daily after breakfast.     mirtazapine (REMERON) 30 MG tablet Take 30 mg by mouth at bedtime.     omeprazole (PRILOSEC) 40 MG capsule Take 1 capsule (40 mg total) by mouth daily. 30 capsule 12   ondansetron (ZOFRAN-ODT) 8 MG disintegrating tablet Take 1 tablet (8 mg total) by mouth every 8 (eight) hours as needed for nausea or vomiting. 30 tablet 0   OXALIPLATIN IV Inject into the vein every 14 (fourteen) days.     oxyCODONE (OXY IR/ROXICODONE) 5 MG immediate release tablet Take 1 tablet (5 mg total) by mouth every 6 (six) hours as needed for severe pain. 30 tablet 0   polyethylene glycol powder (MIRALAX) 17 GM/SCOOP powder Take 17  g by mouth daily. Hold for diarrhea/loose stools     prochlorperazine (COMPAZINE) 10 MG tablet Take 1 tablet (10 mg total) by mouth every 6 (six) hours as needed for nausea or vomiting (Nausea or vomiting). 60 tablet 3   QUEtiapine (SEROQUEL) 200 MG tablet Take 200 mg by mouth at bedtime.     QUEtiapine (SEROQUEL) 25 MG tablet Take 25 mg by mouth 3 (three) times daily after meals. (In the morning, 1300 & 1800)     sodium chloride (OCEAN) 0.65 % SOLN nasal spray Place 1 spray into both nostrils as needed for congestion.     vitamin B-12 (CYANOCOBALAMIN) 500 MCG tablet Take 1 tablet (  500 mcg total) by mouth daily. 30 tablet 2   Zinc 25 MG TABS Take 25 mg by mouth daily.     No current facility-administered medications for this visit.    ALLERGIES:  Allergies  Allergen Reactions   Other Hives and Other (See Comments)    Cherry wood just cut- "smelled it and broke out"; cannot tolerate ANY cherry fragrances, either      Cherry Hives   Wound Dressing Adhesive Rash and Other (See Comments)    Band-Aids = local reaction    PHYSICAL EXAM:  Performance status (ECOG): 1 - Symptomatic but completely ambulatory  There were no vitals filed for this visit.  Wt Readings from Last 3 Encounters:  10/28/21 115 lb 11.2 oz (52.5 kg)  10/13/21 114 lb 6.4 oz (51.9 kg)  10/08/21 114 lb (51.7 kg)   Physical Exam Vitals reviewed.  Constitutional:      Appearance: Normal appearance.  Cardiovascular:     Rate and Rhythm: Normal rate and regular rhythm.     Pulses: Normal pulses.     Heart sounds: Normal heart sounds.  Pulmonary:     Effort: Pulmonary effort is normal.     Breath sounds: Normal breath sounds.  Neurological:     General: No focal deficit present.     Mental Status: She is alert and oriented to person, place, and time.  Psychiatric:        Mood and Affect: Mood normal.        Behavior: Behavior normal.      LABORATORY DATA:  I have reviewed the labs as listed.     Latest  Ref Rng & Units 10/28/2021    8:11 AM 10/13/2021    8:01 AM 09/30/2021    8:28 AM  CBC  WBC 4.0 - 10.5 K/uL 11.7  8.9  3.1   Hemoglobin 12.0 - 15.0 g/dL 12.1  12.4  11.5   Hematocrit 36.0 - 46.0 % 37.5  38.1  35.9   Platelets 150 - 400 K/uL 141  160  194       Latest Ref Rng & Units 10/28/2021    8:11 AM 10/13/2021    8:01 AM 09/30/2021    8:28 AM  CMP  Glucose 70 - 99 mg/dL 111  101  128   BUN 8 - 23 mg/dL _0 Creatinine 0.44 - 1.00 mg/dL 0.83  0.90  0.87   Sodium 135 - 145 mmol/L 139  138  139   Potassium 3.5 - 5.1 mmol/L 3.6  4.1  3.8   Chloride 98 - 111 mmol/L 105  103  106   CO2 22 - 32 mmol/L _1 Calcium 8.9 - 10.3 mg/dL 9.2  9.2  8.9   Total Protein 6.5 - 8.1 g/dL 6.8  7.0  6.5   Total Bilirubin 0.3 - 1.2 mg/dL 0.3  0.4  0.5   Alkaline Phos 38 - 126 U/L 138  120  91   AST 15 - 41 U/L 45  30  22   ALT 0 - 44 U/L 68  37  25     DIAGNOSTIC IMAGING:  I have independently reviewed the scans and discussed with the patient.    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 - EGD/EUS (07/09/2021) by Dr. Rush Landmark: The region where the PD  is strictured, darker appearance was identified.  There was no clear hypoechoic mass.  Endosonographic appearance suggestive of either chronic pancreatitis changes versus possible malignancy.  Endosonographic staging T1 N0 MX.  Pancreatic parenchymal abnormalities consisting of atrophy and hyperechoic strands.  No sign of significant pathology in the CBD.  No malignant appearing lymph nodes in the celiac region, peripancreatic region and porta hepatic region - Pathology (pancreatic duct FNA): Malignant cells consistent with adenocarcinoma - CA 19-9: 31 (0-35) - CTAP (07/13/2021): Fluid collection extending posterior to the gastric antrum and ventral to the pancreas.  Elongated collection extends to the greater  curvature of the stomach with inflammation surrounding the collection suggestive of a pancreatic pseudocyst.  Chronic dilatation of pancreatic duct with abrupt termination of the duct dilatation in the head of the pancreas. - CT chest (07/16/2021): No evidence of metastatic disease in the chest. - 8 to 9 pound weight loss in the last 3 months, decreased appetite.       - PET scan (07/30/2021): Faint focus of increased metabolic activity, SUV 3.6, measuring 9 mm at the junction of the pancreatic head and body, suspicious for pancreatic adenocarcinoma.  No obvious extrapancreatic spread of tumor.  Cystic lesion along the posterior margin of the stomach and anterior margin of the pancreatic head favored pseudocyst.  Small amount of pelvic ascites. - MRI of the abdomen pancreatic protocol (08/03/2021): Similar appearance of the pancreas, with abrupt transition from dilated to decompressed duct in the pancreatic head with no evidence of underlying mass.  Pseudocyst enlarged position between pancreatic head and GE junction with significant mass effect upon the pyloric region and gastric outlet obstruction radiologically.  No evidence of abdominal metastatic disease. - FOLFOX on 09/16/2021, FOLFIRINOX cycle 2 on 09/30/2021    Social/family history: - She is accompanied by her son [name] today.  She lives at home by herself.  She is independent of ADLs and IADLs.  She did office work prior to retirement.  Non-smoker and nonalcoholic. - Brother (alcoholic) and maternal half uncle had pancreatitis.  No history of malignancy.  3. Pulmonary embolism: - She was diagnosed with pulmonary embolism in January 2023 and then will hospital.  There does not appear to be any provoking factors for PE.  Reportedly Doppler was negative for DVT.  She was treated with Eliquis for 60 days, discontinued after that.   PLAN:  Stage I (T1 N0 MX) pancreatic adenocarcinoma: - Cycle 3 was on 10/13/2021. - She had cold sensitivity lasting 10  days.  Denied any tingling or numbness in extremities. - Weight is stable.  Has slight diarrhea alternating with constipation. - Reviewed labs today which showed elevated AST and ALT of 45 and 68 respectively. - CBC was grossly normal. - Proceed with cycle 4 today with 20% dose reduction. - RTC 2 weeks for follow-up with repeat labs and tumor marker.    Orders placed this encounter:  Orders Placed This Encounter  Procedures   Cancer antigen 19-9       [name], MD [place] [phone]

## 2021-11-09 ENCOUNTER — Inpatient Hospital Stay: Payer: Medicare Other

## 2021-11-09 ENCOUNTER — Inpatient Hospital Stay: Payer: Medicare Other | Admitting: Dietician

## 2021-11-09 ENCOUNTER — Inpatient Hospital Stay (HOSPITAL_BASED_OUTPATIENT_CLINIC_OR_DEPARTMENT_OTHER): Payer: Medicare Other | Admitting: Hematology

## 2021-11-09 VITALS — BP 125/62 | HR 84 | Temp 97.9°F | Resp 18

## 2021-11-09 DIAGNOSIS — C250 Malignant neoplasm of head of pancreas: Secondary | ICD-10-CM | POA: Diagnosis not present

## 2021-11-09 DIAGNOSIS — Z95828 Presence of other vascular implants and grafts: Secondary | ICD-10-CM | POA: Diagnosis not present

## 2021-11-09 DIAGNOSIS — Z5111 Encounter for antineoplastic chemotherapy: Secondary | ICD-10-CM | POA: Diagnosis not present

## 2021-11-09 LAB — CBC WITH DIFFERENTIAL/PLATELET
Abs Immature Granulocytes: 0.3 10*3/uL — ABNORMAL HIGH (ref 0.00–0.07)
Band Neutrophils: 2 %
Basophils Absolute: 0 10*3/uL (ref 0.0–0.1)
Basophils Relative: 0 %
Eosinophils Absolute: 0 10*3/uL (ref 0.0–0.5)
Eosinophils Relative: 0 %
HCT: 35.9 % — ABNORMAL LOW (ref 36.0–46.0)
Hemoglobin: 11.7 g/dL — ABNORMAL LOW (ref 12.0–15.0)
Lymphocytes Relative: 13 %
Lymphs Abs: 1.7 10*3/uL (ref 0.7–4.0)
MCH: 32.2 pg (ref 26.0–34.0)
MCHC: 32.6 g/dL (ref 30.0–36.0)
MCV: 98.9 fL (ref 80.0–100.0)
Metamyelocytes Relative: 2 %
Monocytes Absolute: 0.6 10*3/uL (ref 0.1–1.0)
Monocytes Relative: 5 %
Neutro Abs: 10.2 10*3/uL — ABNORMAL HIGH (ref 1.7–7.7)
Neutrophils Relative %: 78 %
Platelets: 158 10*3/uL (ref 150–400)
RBC: 3.63 MIL/uL — ABNORMAL LOW (ref 3.87–5.11)
RDW: 19.6 % — ABNORMAL HIGH (ref 11.5–15.5)
WBC: 12.8 10*3/uL — ABNORMAL HIGH (ref 4.0–10.5)
nRBC: 0.2 % (ref 0.0–0.2)

## 2021-11-09 LAB — COMPREHENSIVE METABOLIC PANEL
ALT: 48 U/L — ABNORMAL HIGH (ref 0–44)
AST: 37 U/L (ref 15–41)
Albumin: 3.5 g/dL (ref 3.5–5.0)
Alkaline Phosphatase: 143 U/L — ABNORMAL HIGH (ref 38–126)
Anion gap: 9 (ref 5–15)
BUN: 21 mg/dL (ref 8–23)
CO2: 26 mmol/L (ref 22–32)
Calcium: 9.3 mg/dL (ref 8.9–10.3)
Chloride: 105 mmol/L (ref 98–111)
Creatinine, Ser: 0.81 mg/dL (ref 0.44–1.00)
GFR, Estimated: >60 mL/min (ref >60)
Glucose, Bld: 104 mg/dL — ABNORMAL HIGH (ref 70–99)
Potassium: 3.9 mmol/L (ref 3.5–5.1)
Sodium: 140 mmol/L (ref 135–145)
Total Bilirubin: 0.2 mg/dL — ABNORMAL LOW (ref 0.3–1.2)
Total Protein: 6.5 g/dL (ref 6.5–8.1)

## 2021-11-09 LAB — MAGNESIUM: Magnesium: 2.1 mg/dL (ref 1.7–2.4)

## 2021-11-09 MED ORDER — SODIUM CHLORIDE 0.9% FLUSH
10.0000 mL | INTRAVENOUS | Status: DC | PRN
  Administered 2021-11-09: 10 mL

## 2021-11-09 MED ORDER — SODIUM CHLORIDE 0.9 % IV SOLN
150.0000 mg | Freq: Once | INTRAVENOUS | Status: AC
  Administered 2021-11-09: 150 mg via INTRAVENOUS
  Filled 2021-11-09: qty 150

## 2021-11-09 MED ORDER — SODIUM CHLORIDE 0.9 % IV SOLN
10.0000 mg | Freq: Once | INTRAVENOUS | Status: AC
  Administered 2021-11-09: 10 mg via INTRAVENOUS
  Filled 2021-11-09: qty 10

## 2021-11-09 MED ORDER — DEXTROSE 5 % IV SOLN: Freq: Once | INTRAVENOUS | Status: AC

## 2021-11-09 MED ORDER — OXALIPLATIN CHEMO INJECTION 100 MG/20ML
65.0000 mg/m2 | Freq: Once | INTRAVENOUS | Status: AC
  Administered 2021-11-09: 100 mg via INTRAVENOUS
  Filled 2021-11-09: qty 20

## 2021-11-09 MED ORDER — SODIUM CHLORIDE 0.9 % IV SOLN
320.0000 mg/m2 | Freq: Once | INTRAVENOUS | Status: AC
  Administered 2021-11-09: 492 mg via INTRAVENOUS
  Filled 2021-11-09: qty 24.6

## 2021-11-09 MED ORDER — PALONOSETRON HCL INJECTION 0.25 MG/5ML
0.2500 mg | Freq: Once | INTRAVENOUS | Status: AC
  Administered 2021-11-09: 0.25 mg via INTRAVENOUS
  Filled 2021-11-09: qty 5

## 2021-11-09 MED ORDER — ATROPINE SULFATE 1 MG/ML IV SOLN
0.5000 mg | Freq: Once | INTRAVENOUS | Status: AC
  Administered 2021-11-09: 0.5 mg via INTRAVENOUS
  Filled 2021-11-09: qty 1

## 2021-11-09 MED ORDER — SODIUM CHLORIDE 0.9 % IV SOLN
120.0000 mg/m2 | Freq: Once | INTRAVENOUS | Status: AC
  Administered 2021-11-09: 180 mg via INTRAVENOUS
  Filled 2021-11-09: qty 4

## 2021-11-09 MED ORDER — SODIUM CHLORIDE 0.9 % IV SOLN
1920.0000 mg/m2 | INTRAVENOUS | Status: DC
  Administered 2021-11-09: 2950 mg via INTRAVENOUS
  Filled 2021-11-09: qty 59

## 2021-11-09 NOTE — Progress Notes (Signed)
Per Dr Delton Coombes -    Please give today and all future chemo at previous doses:   Oxaliplatin 65 mg/m2 IVPB x 1 Irinotecan 120 mg/m2 IVPB x 1 Leucovorin 320 mg/m2 IVPB x 1 Fluorouracil 1920 mg/m2 CIV x 1   T.O. Dr Rhys Martini, PharmD

## 2021-11-09 NOTE — Progress Notes (Signed)
Nutrition Follow-up:  Pt with stage I pancreatic cancer. She is receiving modified FOLFIRINOX q14d.     Met with patient in infusion. She reports appetite is about the same and makes herself eat several small meals/day. Patient remains on low-fat diet. This has been challenging. Patient recalls eating a variety of foods (greek yogurt, whole wheat bread, belvita bars, english muffin pizza, [place] hotdogs, air fryer chicken nuggets, low-fat frozen entrees, raisin bran, peaches, sweet potatoes). Patient is drinking 2 Boost HP (250 kcal, 20 g). She reports getting reimbursed for oral supplements via [place] program. Patient denies nausea, vomiting. Patient has intermittent constipation at baseline. She is taking linzess + miralax as needed. Patient reports cold sensitivity lasting longer. This improved over the weekend from prior treatment.   Medications: improved   Labs: glucose 104  Anthropometrics: Wt 118 lb 12.8 oz today increased   10/4 - 115 lb 11.2 oz 9/19 - 114 lb 6.4 oz   NUTRITION DIAGNOSIS: Unintentional weight loss improved    INTERVENTION:  Continue strategies for increasing calories and protein with small frequent meals and snacks Continue drinking 2 Boost HP     MONITORING, EVALUATION, GOAL: weight trends, intake   NEXT VISIT: To be scheduled as needed

## 2021-11-09 NOTE — Progress Notes (Signed)
[place], [place] [address]   CLINIC:  Medical Oncology/Hematology  PCP:  Sherrilee Gilles, DO [address] 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 VUS in ATM called c.4367G>A and in [facility] called c.238G>A also identified. The remainder of testing was negative/normal. The report date is 09/20/2021.  The CancerNext-Expanded + RNAinsight gene panel offered by [employer] and includes sequencing and rearrangement analysis for the following 77 genes: IP, ALK, APC*, ATM*, AXIN2, BAP1, BARD1, BLM, BMPR1A, BRCA1*, BRCA2*, BRIP1*, CDC73, CDH1*,CDK4, CDKN1B, CDKN2A, CHEK2*, CTNNA1, DICER1, FANCC, FH, FLCN, GALNT12, KIF1B, LZTR1, MAX, MEN1, MET, MLH1*, MSH2*, MSH3, MSH6*, MUTYH*, NBN, NF1*, NF2, NTHL1, PALB2*, PHOX2B, PMS2*, POT1, PRKAR1A, PTCH1, PTEN*, RAD51C*, RAD51D*,RB1, RECQL, RET, SDHA, SDHAF2, SDHB, SDHC, SDHD, SMAD4, SMARCA4, SMARCB1, SMARCE1, STK11, SUFU, TMEM127, TP53*,TSC1, TSC2, VHL and XRCC2 (sequencing and deletion/duplication); EGFR, EGLN1, HOXB13, KIT, MITF, PDGFRA, POLD1 and POLE (sequencing only); EPCAM and GREM1 (deletion/duplication only).     CANCER STAGING:  Cancer Staging  Malignant neoplasm of pancreas [facility]) Staging form: Exocrine Pancreas, AJCC 8th Edition - Clinical stage from 07/29/2021: Stage IA  (cT1, cN0, cM0) - Unsigned   INTERVAL HISTORY:  Sheryl Bender, a 76 y.o. female, seen for follow-up of pancreatic cancer and toxicity assessment prior to cycle 5.  Energy levels are 50%.  Appetite is low at 30% but she is forcing herself to eat.  She gained 3 pounds.  She felt more tired after last cycle.  Cold sensitivity lasted longer for 11 days.  REVIEW OF SYSTEMS:  Review of Systems  Constitutional:  Negative for appetite change and fatigue.  Respiratory:  Negative for shortness of breath.   Gastrointestinal:  Positive for constipation and diarrhea.  Psychiatric/Behavioral:  Positive for depression.   All other systems reviewed and are negative.   PAST MEDICAL/SURGICAL HISTORY:  Past Medical History:  Diagnosis Date   Anxiety    Arthritis    Depression    Dysrhythmia    hx palpitations greater than 5 yrs -neg echo, stress per pt ? where or dr   GERD (gastroesophageal reflux disease)    occ   Pancreatic adenocarcinoma ([place])    Pancreatic pseudocyst    Port-A-Cath in place 09/15/2021   Pulmonary embolism [facility])    September 2022   Past Surgical History:  Procedure Laterality Date   ANTERIOR LAT LUMBAR FUSION Right 12/16/2015   Procedure: RIGHT LUMBAR TWO-THREE, LUMBAR THREE-FOUR, LUMBAR FOUR-FIVE ANTEROLATERAL LUMBAR INTERBODY FUSION;  Surgeon: Erline Levine, MD;  Location: [place];  Service: Neurosurgery;  Laterality: Right;   BALLOON DILATION N/A 08/27/2021   Procedure: BALLOON DILATION;  Surgeon: Rush Landmark Telford Nab., MD;  Location: [facility];  Service: Gastroenterology;  Laterality: N/A;   BIOPSY  07/09/2021   Procedure: BIOPSY;  Surgeon: Rush Landmark Telford Nab., MD;  Location: [place];  Service: Gastroenterology;;  CYST GASTROSTOMY  08/27/2021   Procedure: CYST GASTROSTOMY;  Surgeon: Rush Landmark Telford Nab., MD;  Location: [facility];  Service: Gastroenterology;;   ESOPHAGOGASTRODUODENOSCOPY N/A 07/09/2021   Procedure: ESOPHAGOGASTRODUODENOSCOPY (EGD);   Surgeon: Irving Copas., MD;  Location: [place];  Service: Gastroenterology;  Laterality: N/A;   ESOPHAGOGASTRODUODENOSCOPY (EGD) WITH PROPOFOL N/A 08/27/2021   Procedure: ESOPHAGOGASTRODUODENOSCOPY (EGD) WITH PROPOFOL;  Surgeon: Rush Landmark Telford Nab., MD;  Location: [facility];  Service: Gastroenterology;  Laterality: N/A;   ESOPHAGOGASTRODUODENOSCOPY (EGD) WITH PROPOFOL N/A 10/08/2021   Procedure: ESOPHAGOGASTRODUODENOSCOPY (EGD) WITH PROPOFOL;  Surgeon: Rush Landmark Telford Nab., MD;  Location: [facility];  Service: Gastroenterology;  Laterality: N/A;   EUS N/A 07/09/2021   Procedure: UPPER ENDOSCOPIC ULTRASOUND (EUS) RADIAL;  Surgeon: Irving Copas., MD;  Location: [place];  Service: Gastroenterology;  Laterality: N/A;   EUS N/A 08/27/2021   Procedure: UPPER ENDOSCOPIC ULTRASOUND (EUS) LINEAR;  Surgeon: Irving Copas., MD;  Location: [facility];  Service: Gastroenterology;  Laterality: N/A;   FINE NEEDLE ASPIRATION  07/09/2021   Procedure: FINE NEEDLE ASPIRATION (FNA) LINEAR;  Surgeon: Irving Copas., MD;  Location: [place];  Service: Gastroenterology;;   IR IMAGING GUIDED PORT INSERTION  09/14/2021   LUMBAR PERCUTANEOUS PEDICLE SCREW 3 LEVEL Bilateral 12/16/2015   Procedure: PERCUTANEOUS PEDICLE SCREWS BILATERALLY AT LUMBAR TWO-FIVE;  Surgeon: Erline Levine, MD;  Location: [place];  Service: Neurosurgery;  Laterality: Bilateral;   PANCREATIC STENT PLACEMENT  08/27/2021   Procedure: PANCREATIC STENT PLACEMENT;  Surgeon: Rush Landmark Telford Nab., MD;  Location: [facility];  Service: Gastroenterology;;   Lavell Islam REMOVAL  10/08/2021   Procedure: STENT REMOVAL;  Surgeon: Irving Copas., MD;  Location: [facility];  Service: Gastroenterology;;   TUBAL LIGATION  1974    SOCIAL HISTORY:  Social History   Socioeconomic History   Marital status: Widowed    Spouse name: Not on file   Number of children: Not on file   Years of education: Not on  file   Highest education level: Not on file  Occupational History   Not on file  Tobacco Use   Smoking status: Never   Smokeless tobacco: Never  Substance and Sexual Activity   Alcohol use: No   Drug use: Never   Sexual activity: Not on file  Other Topics Concern   Not on file  Social History Narrative   Not on file   Social Determinants of Health   Financial Resource Strain: Not on file  Food Insecurity: Not on file  Transportation Needs: Not on file  Physical Activity: Not on file  Stress: Not on file  Social Connections: Not on file  Intimate Partner Violence: Not on file    FAMILY HISTORY:  Family History  Problem Relation Age of Onset   Pancreatitis Brother        alcoholic pancreatitis   Pancreatic cancer Neg Hx    Colon cancer Neg Hx     CURRENT MEDICATIONS:  Current Outpatient Medications  Medication Sig Dispense Refill   acetaminophen (TYLENOL) 500 MG tablet Take 2 tablets (1,000 mg total) by mouth every 8 (eight) hours as needed for moderate pain, headache or fever. (Patient taking differently: Take 250-1,000 mg by mouth every 8 (eight) hours as needed for moderate pain, headache or fever.)     alum & mag hydroxide-simeth (MAALOX/MYLANTA) 200-200-20 MG/5ML suspension Take 30 mLs by mouth every 4 (four) hours as needed for indigestion. 355 mL 0   Aromatic Inhalants (VICKS VAPOINHALER) INHA Inhale 1 puff into the  lungs daily as needed (congestion).     ascorbic acid (VITAMIN C) 500 MG tablet Take 500 mg by mouth in the morning.     buPROPion (WELLBUTRIN XL) 300 MG 24 hr tablet Take 300 mg by mouth daily after breakfast.     busPIRone (BUSPAR) 10 MG tablet Take 20 mg by mouth in the morning and at bedtime.     calcium carbonate (TUMS - DOSED IN MG ELEMENTAL CALCIUM) 500 MG chewable tablet Chew 1 tablet (200 mg of elemental calcium total) by mouth 2 (two) times daily as needed for indigestion or heartburn. (Patient taking differently: Chew 1-2 tablets by mouth 2  (two) times daily as needed for indigestion or heartburn.)     Cholecalciferol (VITAMIN D3) 125 MCG (5000 UT) CAPS Take 5,000 Units by mouth every other day.     ciclopirox (LOPROX) 0.77 % cream Apply 1 application  topically at bedtime. Apply to the fingernails     escitalopram (LEXAPRO) 20 MG tablet Take 20 mg by mouth at bedtime.     fluorouracil CALGB 22633 1,920 mg/m2 in sodium chloride 0.9 % 150 mL Inject 1,920 mg/m2 into the vein over 48 hr.     lactose free nutrition (BOOST) LIQD Take 237 mLs by mouth 2 (two) times daily between meals.     lactulose (CHRONULAC) 10 GM/15ML solution Take 15 mLs (10 g total) by mouth at bedtime. Take 15 ml at bedtime every night to assist with bowel movements. If bowel movement has not occurred in 3-4 days take 15 ml every 3 hours until bowel movement has occurred. 236 mL 0   LEUCOVORIN CALCIUM IV Inject into the vein every 14 (fourteen) days.     lidocaine-prilocaine (EMLA) cream Apply a small amount to port a cath site (do not rub in) and cover with plastic wrap 1 hour prior to infusion appointments 30 g 3   linaclotide (LINZESS) 72 MCG capsule Take 1 capsule (72 mcg total) by mouth daily before breakfast. 30 capsule 12   metoprolol succinate (TOPROL-XL) 25 MG 24 hr tablet Take 25 mg by mouth daily after breakfast.     mirtazapine (REMERON) 30 MG tablet Take 30 mg by mouth at bedtime.     omeprazole (PRILOSEC) 40 MG capsule Take 1 capsule (40 mg total) by mouth daily. 30 capsule 12   ondansetron (ZOFRAN-ODT) 8 MG disintegrating tablet Take 1 tablet (8 mg total) by mouth every 8 (eight) hours as needed for nausea or vomiting. 30 tablet 0   OXALIPLATIN IV Inject into the vein every 14 (fourteen) days.     oxyCODONE (OXY IR/ROXICODONE) 5 MG immediate release tablet Take 1 tablet (5 mg total) by mouth every 6 (six) hours as needed for severe pain. 30 tablet 0   polyethylene glycol powder (MIRALAX) 17 GM/SCOOP powder Take 17 g by mouth daily. Hold for  diarrhea/loose stools     prochlorperazine (COMPAZINE) 10 MG tablet Take 1 tablet (10 mg total) by mouth every 6 (six) hours as needed for nausea or vomiting (Nausea or vomiting). 60 tablet 3   QUEtiapine (SEROQUEL) 200 MG tablet Take 200 mg by mouth at bedtime.     QUEtiapine (SEROQUEL) 25 MG tablet Take 25 mg by mouth 3 (three) times daily after meals. (In the morning, 1300 & 1800)     sodium chloride (OCEAN) 0.65 % SOLN nasal spray Place 1 spray into both nostrils as needed for congestion.     vitamin B-12 (CYANOCOBALAMIN) 500 MCG tablet Take 1 tablet (  500 mcg total) by mouth daily. 30 tablet 2   Zinc 25 MG TABS Take 25 mg by mouth daily.     No current facility-administered medications for this visit.    ALLERGIES:  Allergies  Allergen Reactions   Other Hives and Other (See Comments)    Cherry wood just cut- "smelled it and broke out"; cannot tolerate ANY cherry fragrances, either      Cherry Hives   Wound Dressing Adhesive Rash and Other (See Comments)    Band-Aids = local reaction    PHYSICAL EXAM:  Performance status (ECOG): 1 - Symptomatic but completely ambulatory  There were no vitals filed for this visit.  Wt Readings from Last 3 Encounters:  10/28/21 115 lb 11.2 oz (52.5 kg)  10/13/21 114 lb 6.4 oz (51.9 kg)  10/08/21 114 lb (51.7 kg)   Physical Exam Vitals reviewed.  Constitutional:      Appearance: Normal appearance.  Cardiovascular:     Rate and Rhythm: Normal rate and regular rhythm.     Pulses: Normal pulses.     Heart sounds: Normal heart sounds.  Pulmonary:     Effort: Pulmonary effort is normal.     Breath sounds: Normal breath sounds.  Neurological:     General: No focal deficit present.     Mental Status: She is alert and oriented to person, place, and time.  Psychiatric:        Mood and Affect: Mood normal.        Behavior: Behavior normal.      LABORATORY DATA:  I have reviewed the labs as listed.     Latest Ref Rng & Units 10/28/2021     8:11 AM 10/13/2021    8:01 AM 09/30/2021    8:28 AM  CBC  WBC 4.0 - 10.5 K/uL 11.7  8.9  3.1   Hemoglobin 12.0 - 15.0 g/dL 12.1  12.4  11.5   Hematocrit 36.0 - 46.0 % 37.5  38.1  35.9   Platelets 150 - 400 K/uL 141  160  194       Latest Ref Rng & Units 10/28/2021    8:11 AM 10/13/2021    8:01 AM 09/30/2021    8:28 AM  CMP  Glucose 70 - 99 mg/dL 111  101  128   BUN 8 - 23 mg/dL _0 Creatinine 0.44 - 1.00 mg/dL 0.83  0.90  0.87   Sodium 135 - 145 mmol/L 139  138  139   Potassium 3.5 - 5.1 mmol/L 3.6  4.1  3.8   Chloride 98 - 111 mmol/L 105  103  106   CO2 22 - 32 mmol/L _1 Calcium 8.9 - 10.3 mg/dL 9.2  9.2  8.9   Total Protein 6.5 - 8.1 g/dL 6.8  7.0  6.5   Total Bilirubin 0.3 - 1.2 mg/dL 0.3  0.4  0.5   Alkaline Phos 38 - 126 U/L 138  120  91   AST 15 - 41 U/L 45  30  22   ALT 0 - 44 U/L 68  37  25     DIAGNOSTIC IMAGING:  I have independently reviewed the scans and discussed with the patient.    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 - EGD/EUS (07/09/2021) by Dr. Rush Landmark: The region where the PD  is strictured, darker appearance was identified.  There was no clear hypoechoic mass.  Endosonographic appearance suggestive of either chronic pancreatitis changes versus possible malignancy.  Endosonographic staging T1 N0 MX.  Pancreatic parenchymal abnormalities consisting of atrophy and hyperechoic strands.  No sign of significant pathology in the CBD.  No malignant appearing lymph nodes in the celiac region, peripancreatic region and porta hepatic region - Pathology (pancreatic duct FNA): Malignant cells consistent with adenocarcinoma - CA 19-9: 31 (0-35) - CTAP (07/13/2021): Fluid collection extending posterior to the gastric antrum and ventral to the pancreas.  Elongated collection extends to the greater curvature of the stomach  with inflammation surrounding the collection suggestive of a pancreatic pseudocyst.  Chronic dilatation of pancreatic duct with abrupt termination of the duct dilatation in the head of the pancreas. - CT chest (07/16/2021): No evidence of metastatic disease in the chest. - 8 to 9 pound weight loss in the last 3 months, decreased appetite.       - PET scan (07/30/2021): Faint focus of increased metabolic activity, SUV 3.6, measuring 9 mm at the junction of the pancreatic head and body, suspicious for pancreatic adenocarcinoma.  No obvious extrapancreatic spread of tumor.  Cystic lesion along the posterior margin of the stomach and anterior margin of the pancreatic head favored pseudocyst.  Small amount of pelvic ascites. - MRI of the abdomen pancreatic protocol (08/03/2021): Similar appearance of the pancreas, with abrupt transition from dilated to decompressed duct in the pancreatic head with no evidence of underlying mass.  Pseudocyst enlarged position between pancreatic head and GE junction with significant mass effect upon the pyloric region and gastric outlet obstruction radiologically.  No evidence of abdominal metastatic disease. - FOLFOX on 09/16/2021, FOLFIRINOX cycle 2 on 09/30/2021    Social/family history: - She is accompanied by her son Sheryl Bender today.  She lives at home by herself.  She is independent of ADLs and IADLs.  She did office work prior to retirement.  Non-smoker and nonalcoholic. - Brother (alcoholic) and maternal half uncle had pancreatitis.  No history of malignancy.  3. Pulmonary embolism: - She was diagnosed with pulmonary embolism in January 2023 and then will hospital.  There does not appear to be any provoking factors for PE.  Reportedly Doppler was negative for DVT.  She was treated with Eliquis for 60 days, discontinued after that.   PLAN:  Stage I (T1 N0 MX) pancreatic adenocarcinoma: - She felt more tired after cycle 4. - Cold sensitivity lasted longer for about 11 days.   Denies any continuous tingling or numbness in the extremities. - Reviewed labs today which showed AST has normalized.  ALT improved to 48 from 68.  Total bilirubin is normal.  CBC was grossly normal. - Proceed with cycle 5 today.  RTC 2 weeks for follow-up.  We will check tumor marker.  We will consider scans after cycle 6.  2.  Weight loss: - She is continuing to eat better for the last 1 month.  She Has gained about 3 pounds.  She has not started Megace.    Orders placed this encounter:  No orders of the defined types were placed in this encounter.      [name], MD [place] [phone]

## 2021-11-09 NOTE — Patient Instructions (Signed)
[facility]  Discharge Instructions: Thank you for choosing [facility] to provide your oncology and hematology care.  If you have a lab appointment with the Cancer Center, please come in thru the Main Entrance and check in at the main information desk.  Wear comfortable clothing and clothing appropriate for easy access to any Portacath or PICC line.   We strive to give you quality time with your provider. You may need to reschedule your appointment if you arrive late (15 or more minutes).  Arriving late affects you and other patients whose appointments are after yours.  Also, if you miss three or more appointments without notifying the office, you may be dismissed from the clinic at the provider's discretion.      For prescription refill requests, have your pharmacy contact our office and allow 72 hours for refills to be completed.    Today you received the following chemotherapy and/or immunotherapy agents FOLFIRINOX, return as scheduled.   To help prevent nausea and vomiting after your treatment, we encourage you to take your nausea medication as directed.  BELOW ARE SYMPTOMS THAT SHOULD BE REPORTED IMMEDIATELY: *FEVER GREATER THAN 100.4 F (38 C) OR HIGHER *CHILLS OR SWEATING *NAUSEA AND VOMITING THAT IS NOT CONTROLLED WITH YOUR NAUSEA MEDICATION *UNUSUAL SHORTNESS OF BREATH *UNUSUAL BRUISING OR BLEEDING *URINARY PROBLEMS (pain or burning when urinating, or frequent urination) *BOWEL PROBLEMS (unusual diarrhea, constipation, pain near the anus) TENDERNESS IN MOUTH AND THROAT WITH OR WITHOUT PRESENCE OF ULCERS (sore throat, sores in mouth, or a toothache) UNUSUAL RASH, SWELLING OR PAIN  UNUSUAL VAGINAL DISCHARGE OR ITCHING   Items with * indicate a potential emergency and should be followed up as soon as possible or go to the Emergency Department if any problems should occur.  Please show the CHEMOTHERAPY ALERT CARD or IMMUNOTHERAPY ALERT CARD at  check-in to the Emergency Department and triage nurse.  Should you have questions after your visit or need to cancel or reschedule your appointment, please contact [facility] [phone]  and follow the prompts.  Office hours are 8:00 a.m. to 4:30 p.m. Monday - Friday. Please note that voicemails left after 4:00 p.m. may not be returned until the following business day.  We are closed weekends and major holidays. You have access to a nurse at all times for urgent questions. Please call the main number to the clinic [phone] and follow the prompts.  For any non-urgent questions, you may also contact your provider using MyChart. We now offer e-Visits for anyone 18 and older to request care online for non-urgent symptoms. For details visit [email or web address].   Also download the MyChart app! Go to the app store, search "MyChart", open the app, select [facility], and log in with your MyChart username and password.  Masks are optional in the cancer centers. If you would like for your care team to wear a mask while they are taking care of you, please let them know. You may have one support person who is at least 78 years old accompany you for your appointments.  

## 2021-11-09 NOTE — Patient Instructions (Addendum)
[facility] Discharge Instructions   You were seen and examined today by Dr. Katragadda.  He reviewed the results of your lab work which are normal/stable.   We will proceed with your treatment today.    Thank you for choosing [facility] to provide your oncology and hematology care.  To afford each patient quality time with our provider, please arrive at least 15 minutes before your scheduled appointment time.   If you have a lab appointment with the Cancer Center please come in thru the Main Entrance and check in at the main information desk.  You need to re-schedule your appointment should you arrive 10 or more minutes late.  We strive to give you quality time with our providers, and arriving late affects you and other patients whose appointments are after yours.  Also, if you no show three or more times for appointments you may be dismissed from the clinic at the providers discretion.     Again, thank you for choosing [facility].  Our hope is that these requests will decrease the amount of time that you wait before being seen by our physicians.       _____________________________________________________________  Should you have questions after your visit to [facility], please contact our office at [phone] and follow the prompts.  Our office hours are 8:00 a.m. and 4:30 p.m. Monday - Friday.  Please note that voicemails left after 4:00 p.m. may not be returned until the following business day.  We are closed weekends and major holidays.  You do have access to a nurse 24-7, just call the main number to the clinic [phone] and do not press any options, hold on the line and a nurse will answer the phone.    For prescription refill requests, have your pharmacy contact our office and allow 72 hours.    Due to Covid, you will need to wear a mask upon entering the hospital. If you do  not have a mask, a mask will be given to you at the Main Entrance upon arrival. For doctor visits, patients may have 1 support person age 18 or older with them. For treatment visits, patients can not have anyone with them due to social distancing guidelines and our immunocompromised population.      

## 2021-11-09 NOTE — Progress Notes (Signed)
Patient okay for treatment today per Dr. Delton Coombes with patient remaining at 20% dose reduction, pharmacy aware. Patient tolerated chemotherapy with no complaints voiced. Side effects with management reviewed understanding verbalized. Port site clean and dry with no bruising or swelling noted at site. Good blood return noted before and after administration of chemotherapy. Chemo pump connected with no alarms noted. Patient left in satisfactory condition with VSS and no s/s of distress noted.

## 2021-11-09 NOTE — Progress Notes (Signed)
Patient has been examined by Dr. Katragadda, and vital signs and labs have been reviewed. ANC, Creatinine, LFTs, hemoglobin, and platelets are within treatment parameters per M.D. - pt may proceed with treatment.  Primary RN and pharmacy notified.  

## 2021-11-10 LAB — CANCER ANTIGEN 19-9: CA 19-9: 18 U/mL (ref 0–35)

## 2021-11-11 ENCOUNTER — Inpatient Hospital Stay: Payer: Medicare Other

## 2021-11-11 VITALS — BP 110/71 | HR 82 | Temp 97.6°F | Resp 16

## 2021-11-11 DIAGNOSIS — Z95828 Presence of other vascular implants and grafts: Secondary | ICD-10-CM

## 2021-11-11 DIAGNOSIS — C250 Malignant neoplasm of head of pancreas: Secondary | ICD-10-CM

## 2021-11-11 DIAGNOSIS — Z5111 Encounter for antineoplastic chemotherapy: Secondary | ICD-10-CM | POA: Diagnosis not present

## 2021-11-11 MED ORDER — PEGFILGRASTIM-JMDB 6 MG/0.6ML ~~LOC~~ SOSY
6.0000 mg | PREFILLED_SYRINGE | Freq: Once | SUBCUTANEOUS | Status: AC
  Administered 2021-11-11: 6 mg via SUBCUTANEOUS
  Filled 2021-11-11: qty 0.6

## 2021-11-11 MED ORDER — HEPARIN SOD (PORK) LOCK FLUSH 100 UNIT/ML IV SOLN
500.0000 [IU] | Freq: Once | INTRAVENOUS | Status: AC | PRN
  Administered 2021-11-11: 500 [IU]

## 2021-11-11 MED ORDER — SODIUM CHLORIDE 0.9% FLUSH
10.0000 mL | INTRAVENOUS | Status: DC | PRN
  Administered 2021-11-11: 10 mL

## 2021-11-11 NOTE — Progress Notes (Signed)
Patient presents today for 5FU pump stop and disconnection after 46 hour continous infusion.   5FU pump deaccessed.  Patients port flushed without difficulty.  Good blood return noted with no bruising or swelling noted at site.  Needle removed intact.  Band aid applied.  Fuphila administration without incident; injection site WNL; see MAR for injection details.  Patient tolerated procedure well and without incident.  No questions or complaints noted at this time.

## 2021-11-11 NOTE — Patient Instructions (Signed)
[facility]  Discharge Instructions: Thank you for choosing [facility] to provide your oncology and hematology care.  If you have a lab appointment with the [facility], please come in thru the Main Entrance and check in at the main information desk.  Wear comfortable clothing and clothing appropriate for easy access to any Portacath or PICC line.   We strive to give you quality time with your provider. You may need to reschedule your appointment if you arrive late (15 or more minutes).  Arriving late affects you and other patients whose appointments are after yours.  Also, if you miss three or more appointments without notifying the office, you may be dismissed from the clinic at the provider's discretion.      For prescription refill requests, have your pharmacy contact our office and allow 72 hours for refills to be completed.    Today you received the following chemotherapy and/or immunotherapy agents Fulphila      To help prevent nausea and vomiting after your treatment, we encourage you to take your nausea medication as directed.  BELOW ARE SYMPTOMS THAT SHOULD BE REPORTED IMMEDIATELY: *FEVER GREATER THAN 100.4 F (38 C) OR HIGHER *CHILLS OR SWEATING *NAUSEA AND VOMITING THAT IS NOT CONTROLLED WITH YOUR NAUSEA MEDICATION *UNUSUAL SHORTNESS OF BREATH *UNUSUAL BRUISING OR BLEEDING *URINARY PROBLEMS (pain or burning when urinating, or frequent urination) *BOWEL PROBLEMS (unusual diarrhea, constipation, pain near the anus) TENDERNESS IN MOUTH AND THROAT WITH OR WITHOUT PRESENCE OF ULCERS (sore throat, sores in mouth, or a toothache) UNUSUAL RASH, SWELLING OR PAIN  UNUSUAL VAGINAL DISCHARGE OR ITCHING   Items with * indicate a potential emergency and should be followed up as soon as possible or go to the Emergency Department if any problems should occur.  Please show the CHEMOTHERAPY ALERT CARD or IMMUNOTHERAPY ALERT CARD at check-in to the  Emergency Department and triage nurse.  Should you have questions after your visit or need to cancel or reschedule your appointment, please contact [facility] [phone]  and follow the prompts.  Office hours are 8:00 a.m. to 4:30 p.m. Monday - Friday. Please note that voicemails left after 4:00 p.m. may not be returned until the following business day.  We are closed weekends and major holidays. You have access to a nurse at all times for urgent questions. Please call the main number to the clinic [phone] and follow the prompts.  For any non-urgent questions, you may also contact your provider using MyChart. We now offer e-Visits for anyone 34 and older to request care online for non-urgent symptoms. For details visit [email or web address].   Also download the MyChart app! Go to the app store, search "MyChart", open the app, select [facility], and log in with your MyChart username and password.  Masks are optional in the cancer centers. If you would like for your care team to wear a mask while they are taking care of you, please let them know. You may have one support person who is at least 78 years old accompany you for your appointments.

## 2021-11-24 ENCOUNTER — Inpatient Hospital Stay: Payer: Medicare Other

## 2021-11-24 ENCOUNTER — Inpatient Hospital Stay (HOSPITAL_BASED_OUTPATIENT_CLINIC_OR_DEPARTMENT_OTHER): Payer: Medicare Other | Admitting: Hematology

## 2021-11-24 VITALS — BP 118/66 | HR 78 | Temp 97.9°F | Resp 18

## 2021-11-24 DIAGNOSIS — Z95828 Presence of other vascular implants and grafts: Secondary | ICD-10-CM

## 2021-11-24 DIAGNOSIS — C250 Malignant neoplasm of head of pancreas: Secondary | ICD-10-CM

## 2021-11-24 DIAGNOSIS — Z5111 Encounter for antineoplastic chemotherapy: Secondary | ICD-10-CM | POA: Diagnosis not present

## 2021-11-24 LAB — COMPREHENSIVE METABOLIC PANEL
ALT: 58 U/L — ABNORMAL HIGH (ref 0–44)
AST: 46 U/L — ABNORMAL HIGH (ref 15–41)
Albumin: 3.4 g/dL — ABNORMAL LOW (ref 3.5–5.0)
Alkaline Phosphatase: 119 U/L (ref 38–126)
Anion gap: 8 (ref 5–15)
BUN: 22 mg/dL (ref 8–23)
CO2: 26 mmol/L (ref 22–32)
Calcium: 9.1 mg/dL (ref 8.9–10.3)
Chloride: 106 mmol/L (ref 98–111)
Creatinine, Ser: 0.79 mg/dL (ref 0.44–1.00)
GFR, Estimated: >60 mL/min (ref >60)
Glucose, Bld: 108 mg/dL — ABNORMAL HIGH (ref 70–99)
Potassium: 3.8 mmol/L (ref 3.5–5.1)
Sodium: 140 mmol/L (ref 135–145)
Total Bilirubin: 0.3 mg/dL (ref 0.3–1.2)
Total Protein: 6.5 g/dL (ref 6.5–8.1)

## 2021-11-24 LAB — CBC WITH DIFFERENTIAL/PLATELET
Abs Immature Granulocytes: 0.54 10*3/uL — ABNORMAL HIGH (ref 0.00–0.07)
Basophils Absolute: 0.1 10*3/uL (ref 0.0–0.1)
Basophils Relative: 1 %
Eosinophils Absolute: 0 10*3/uL (ref 0.0–0.5)
Eosinophils Relative: 0 %
HCT: 35.1 % — ABNORMAL LOW (ref 36.0–46.0)
Hemoglobin: 11.5 g/dL — ABNORMAL LOW (ref 12.0–15.0)
Immature Granulocytes: 4 %
Lymphocytes Relative: 11 %
Lymphs Abs: 1.4 10*3/uL (ref 0.7–4.0)
MCH: 31.8 pg (ref 26.0–34.0)
MCHC: 32.8 g/dL (ref 30.0–36.0)
MCV: 97 fL (ref 80.0–100.0)
Monocytes Absolute: 0.9 10*3/uL (ref 0.1–1.0)
Monocytes Relative: 8 %
Neutro Abs: 9.5 10*3/uL — ABNORMAL HIGH (ref 1.7–7.7)
Neutrophils Relative %: 76 %
Platelets: 201 10*3/uL (ref 150–400)
RBC: 3.62 MIL/uL — ABNORMAL LOW (ref 3.87–5.11)
RDW: 18.6 % — ABNORMAL HIGH (ref 11.5–15.5)
WBC: 12.5 10*3/uL — ABNORMAL HIGH (ref 4.0–10.5)
nRBC: 0 % (ref 0.0–0.2)

## 2021-11-24 LAB — MAGNESIUM: Magnesium: 2.1 mg/dL (ref 1.7–2.4)

## 2021-11-24 MED ORDER — SODIUM CHLORIDE 0.9% FLUSH
10.0000 mL | INTRAVENOUS | Status: DC | PRN
  Administered 2021-11-24: 10 mL via INTRAVENOUS

## 2021-11-24 MED ORDER — SODIUM CHLORIDE 0.9 % IV SOLN
150.0000 mg | Freq: Once | INTRAVENOUS | Status: AC
  Administered 2021-11-24: 150 mg via INTRAVENOUS
  Filled 2021-11-24: qty 150

## 2021-11-24 MED ORDER — SODIUM CHLORIDE 0.9 % IV SOLN
120.0000 mg/m2 | Freq: Once | INTRAVENOUS | Status: AC
  Administered 2021-11-24: 180 mg via INTRAVENOUS
  Filled 2021-11-24: qty 5

## 2021-11-24 MED ORDER — OXALIPLATIN CHEMO INJECTION 100 MG/20ML
65.0000 mg/m2 | Freq: Once | INTRAVENOUS | Status: AC
  Administered 2021-11-24: 100 mg via INTRAVENOUS
  Filled 2021-11-24: qty 20

## 2021-11-24 MED ORDER — SODIUM CHLORIDE 0.9 % IV SOLN
10.0000 mg | Freq: Once | INTRAVENOUS | Status: AC
  Administered 2021-11-24: 10 mg via INTRAVENOUS
  Filled 2021-11-24: qty 1

## 2021-11-24 MED ORDER — SODIUM CHLORIDE 0.9 % IV SOLN
320.0000 mg/m2 | Freq: Once | INTRAVENOUS | Status: AC
  Administered 2021-11-24: 492 mg via INTRAVENOUS
  Filled 2021-11-24: qty 24.6

## 2021-11-24 MED ORDER — DEXTROSE 5 % IV SOLN: Freq: Once | INTRAVENOUS | Status: AC

## 2021-11-24 MED ORDER — SODIUM CHLORIDE 0.9 % IV SOLN
1920.0000 mg/m2 | INTRAVENOUS | Status: DC
  Administered 2021-11-24: 2950 mg via INTRAVENOUS
  Filled 2021-11-24: qty 59

## 2021-11-24 MED ORDER — PALONOSETRON HCL INJECTION 0.25 MG/5ML
0.2500 mg | Freq: Once | INTRAVENOUS | Status: AC
  Administered 2021-11-24: 0.25 mg via INTRAVENOUS
  Filled 2021-11-24: qty 5

## 2021-11-24 MED ORDER — ATROPINE SULFATE 1 MG/ML IV SOLN
0.5000 mg | Freq: Once | INTRAVENOUS | Status: AC
  Administered 2021-11-24: 0.5 mg via INTRAVENOUS
  Filled 2021-11-24: qty 1

## 2021-11-24 MED ORDER — SODIUM CHLORIDE 0.9 % IV SOLN: INTRAVENOUS | Status: DC

## 2021-11-24 NOTE — Patient Instructions (Signed)
[facility]  Discharge Instructions: Thank you for choosing [facility] to provide your oncology and hematology care.  If you have a lab appointment with the [facility], please come in thru the Main Entrance and check in at the main information desk.  Wear comfortable clothing and clothing appropriate for easy access to any Portacath or PICC line.   We strive to give you quality time with your provider. You may need to reschedule your appointment if you arrive late (15 or more minutes).  Arriving late affects you and other patients whose appointments are after yours.  Also, if you miss three or more appointments without notifying the office, you may be dismissed from the clinic at the provider's discretion.      For prescription refill requests, have your pharmacy contact our office and allow 72 hours for refills to be completed.    Today you received the following chemotherapy and/or immunotherapy agents Folfirinox   To help prevent nausea and vomiting after your treatment, we encourage you to take your nausea medication as directed.  BELOW ARE SYMPTOMS THAT SHOULD BE REPORTED IMMEDIATELY: *FEVER GREATER THAN 100.4 F (38 C) OR HIGHER *CHILLS OR SWEATING *NAUSEA AND VOMITING THAT IS NOT CONTROLLED WITH YOUR NAUSEA MEDICATION *UNUSUAL SHORTNESS OF BREATH *UNUSUAL BRUISING OR BLEEDING *URINARY PROBLEMS (pain or burning when urinating, or frequent urination) *BOWEL PROBLEMS (unusual diarrhea, constipation, pain near the anus) TENDERNESS IN MOUTH AND THROAT WITH OR WITHOUT PRESENCE OF ULCERS (sore throat, sores in mouth, or a toothache) UNUSUAL RASH, SWELLING OR PAIN  UNUSUAL VAGINAL DISCHARGE OR ITCHING   Items with * indicate a potential emergency and should be followed up as soon as possible or go to the Emergency Department if any problems should occur.  Please show the CHEMOTHERAPY ALERT CARD or IMMUNOTHERAPY ALERT CARD at check-in to the Emergency  Department and triage nurse.  Should you have questions after your visit or need to cancel or reschedule your appointment, please contact [facility] [phone]  and follow the prompts.  Office hours are 8:00 a.m. to 4:30 p.m. Monday - Friday. Please note that voicemails left after 4:00 p.m. may not be returned until the following business day.  We are closed weekends and major holidays. You have access to a nurse at all times for urgent questions. Please call the main number to the clinic [phone] and follow the prompts.  For any non-urgent questions, you may also contact your provider using MyChart. We now offer e-Visits for anyone 87 and older to request care online for non-urgent symptoms. For details visit [email or web address].   Also download the MyChart app! Go to the app store, search "MyChart", open the app, select [facility], and log in with your MyChart username and password.  Masks are optional in the cancer centers. If you would like for your care team to wear a mask while they are taking care of you, please let them know. You may have one support person who is at least 78 years old accompany you for your appointments.

## 2021-11-24 NOTE — Progress Notes (Signed)
Patients port flushed without difficulty.  Good blood return noted with no bruising or swelling noted at site.  Patient remains accessed for chemotherapy treatment.  

## 2021-11-24 NOTE — Progress Notes (Signed)
[facility] [place], [place] [address]   CLINIC:  Medical Oncology/Hematology  PCP:  Sherrilee Gilles, DO [address] [place] [address] [phone]   REASON FOR VISIT:  Follow-up for pancreatic cancer  PRIOR THERAPY: none  NGS Results: not done  CURRENT THERAPY: under work-up  BRIEF ONCOLOGIC HISTORY:  Oncology History  Malignant neoplasm of pancreas ([facility])  07/21/2021 Initial Diagnosis   Malignant neoplasm of pancreas (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 VUS in ATM called c.4367G>A and in [facility] called c.238G>A also identified. The remainder of testing was negative/normal. The report date is 09/20/2021.  The CancerNext-Expanded + RNAinsight gene panel offered by [employer] and includes sequencing and rearrangement analysis for the following 77 genes: IP, ALK, APC*, ATM*, AXIN2, BAP1, BARD1, BLM, BMPR1A, BRCA1*, BRCA2*, BRIP1*, CDC73, CDH1*,CDK4, CDKN1B, CDKN2A, CHEK2*, CTNNA1, DICER1, FANCC, FH, FLCN, GALNT12, KIF1B, LZTR1, MAX, MEN1, MET, MLH1*, MSH2*, MSH3, MSH6*, MUTYH*, NBN, NF1*, NF2, NTHL1, PALB2*, PHOX2B, PMS2*, POT1, PRKAR1A, PTCH1, PTEN*, RAD51C*, RAD51D*,RB1, RECQL, RET, SDHA, SDHAF2, SDHB, SDHC, SDHD, SMAD4, SMARCA4, SMARCB1, SMARCE1, STK11, SUFU, TMEM127, TP53*,TSC1, TSC2, VHL and XRCC2 (sequencing and deletion/duplication); EGFR, EGLN1, HOXB13, KIT, MITF, PDGFRA, POLD1 and POLE (sequencing only); EPCAM and GREM1 (deletion/duplication only).     CANCER STAGING:  Cancer Staging  Malignant neoplasm of pancreas [facility]) Staging form: Exocrine Pancreas, AJCC 8th Edition - Clinical stage from 07/29/2021: Stage IA  (cT1, cN0, cM0) - Unsigned   INTERVAL HISTORY:  Ms. Khushboo Chuck, a 76 y.o. female, seen for follow-up of pancreatic cancer and toxicity assessment prior to cycle 6 of chemotherapy.  She is accompanied by her son today.  She reported cold sensitivity which lasted about 12 days.  She lost about 2 pounds since last visit.  She had diarrhea for a day or 2.  She also had nausea and vomiting for 1 day.  No constant tingling or numbness reported.  REVIEW OF SYSTEMS:  Review of Systems  Constitutional:  Negative for appetite change and fatigue.  Respiratory:  Positive for shortness of breath (On exertion).   Gastrointestinal:  Positive for diarrhea, nausea and vomiting.  Psychiatric/Behavioral:  Positive for depression.   All other systems reviewed and are negative.   PAST MEDICAL/SURGICAL HISTORY:  Past Medical History:  Diagnosis Date   Anxiety    Arthritis    Depression    Dysrhythmia    hx palpitations greater than 5 yrs -neg echo, stress per pt ? where or dr   GERD (gastroesophageal reflux disease)    occ   Pancreatic adenocarcinoma ([facility])    Pancreatic pseudocyst    Port-A-Cath in place 09/15/2021   Pulmonary embolism [facility])    September 2022   Past Surgical History:  Procedure Laterality Date   ANTERIOR LAT LUMBAR FUSION Right 12/16/2015   Procedure: RIGHT LUMBAR TWO-THREE, LUMBAR THREE-FOUR, LUMBAR FOUR-FIVE ANTEROLATERAL LUMBAR INTERBODY FUSION;  Surgeon: Erline Levine, MD;  Location: [facility];  Service: Neurosurgery;  Laterality: Right;   BALLOON DILATION N/A 08/27/2021   Procedure: BALLOON DILATION;  Surgeon: Rush Landmark Telford Nab., MD;  Location: [facility];  Service: Gastroenterology;  Laterality: N/A;   BIOPSY  07/09/2021   Procedure: BIOPSY;  Surgeon: Irving Copas., MD;  Location: [facility];  Service: Gastroenterology;;   CYST GASTROSTOMY  08/27/2021   Procedure: CYST GASTROSTOMY;  Surgeon: Irving Copas., MD;  Location: [facility];  Service:  Gastroenterology;;   ESOPHAGOGASTRODUODENOSCOPY N/A 07/09/2021   Procedure: ESOPHAGOGASTRODUODENOSCOPY (EGD);  Surgeon: Irving Copas., MD;  Location: [facility];  Service: Gastroenterology;  Laterality: N/A;   ESOPHAGOGASTRODUODENOSCOPY (EGD) WITH PROPOFOL N/A 08/27/2021   Procedure: ESOPHAGOGASTRODUODENOSCOPY (EGD) WITH PROPOFOL;  Surgeon: Rush Landmark Telford Nab., MD;  Location: [facility];  Service: Gastroenterology;  Laterality: N/A;   ESOPHAGOGASTRODUODENOSCOPY (EGD) WITH PROPOFOL N/A 10/08/2021   Procedure: ESOPHAGOGASTRODUODENOSCOPY (EGD) WITH PROPOFOL;  Surgeon: Rush Landmark Telford Nab., MD;  Location: [facility];  Service: Gastroenterology;  Laterality: N/A;   EUS N/A 07/09/2021   Procedure: UPPER ENDOSCOPIC ULTRASOUND (EUS) RADIAL;  Surgeon: Irving Copas., MD;  Location: [facility];  Service: Gastroenterology;  Laterality: N/A;   EUS N/A 08/27/2021   Procedure: UPPER ENDOSCOPIC ULTRASOUND (EUS) LINEAR;  Surgeon: Irving Copas., MD;  Location: [facility];  Service: Gastroenterology;  Laterality: N/A;   FINE NEEDLE ASPIRATION  07/09/2021   Procedure: FINE NEEDLE ASPIRATION (FNA) LINEAR;  Surgeon: Irving Copas., MD;  Location: [facility];  Service: Gastroenterology;;   IR IMAGING GUIDED PORT INSERTION  09/14/2021   LUMBAR PERCUTANEOUS PEDICLE SCREW 3 LEVEL Bilateral 12/16/2015   Procedure: PERCUTANEOUS PEDICLE SCREWS BILATERALLY AT LUMBAR TWO-FIVE;  Surgeon: Erline Levine, MD;  Location: [facility];  Service: Neurosurgery;  Laterality: Bilateral;   PANCREATIC STENT PLACEMENT  08/27/2021   Procedure: PANCREATIC STENT PLACEMENT;  Surgeon: Rush Landmark Telford Nab., MD;  Location: [facility];  Service: Gastroenterology;;   Lavell Islam REMOVAL  10/08/2021   Procedure: STENT REMOVAL;  Surgeon: Irving Copas., MD;  Location: [facility];  Service: Gastroenterology;;   TUBAL LIGATION  1974    SOCIAL HISTORY:  Social History   Socioeconomic History    Marital status: Widowed    Spouse name: Not on file   Number of children: Not on file   Years of education: Not on file   Highest education level: Not on file  Occupational History   Not on file  Tobacco Use   Smoking status: Never   Smokeless tobacco: Never  Substance and Sexual Activity   Alcohol use: No   Drug use: Never   Sexual activity: Not on file  Other Topics Concern   Not on file  Social History Narrative   Not on file   Social Determinants of Health   Financial Resource Strain: Not on file  Food Insecurity: Not on file  Transportation Needs: Not on file  Physical Activity: Not on file  Stress: Not on file  Social Connections: Not on file  Intimate Partner Violence: Not on file    FAMILY HISTORY:  Family History  Problem Relation Age of Onset   Pancreatitis Brother        alcoholic pancreatitis   Pancreatic cancer Neg Hx    Colon cancer Neg Hx     CURRENT MEDICATIONS:  Current Outpatient Medications  Medication Sig Dispense Refill   acetaminophen (TYLENOL) 500 MG tablet Take 2 tablets (1,000 mg total) by mouth every 8 (eight) hours as needed for moderate pain, headache or fever. (Patient taking differently: Take 250-1,000 mg by mouth every 8 (eight) hours as needed for moderate pain, headache or fever.)     alum & mag hydroxide-simeth (MAALOX/MYLANTA) 200-200-20 MG/5ML suspension Take 30 mLs by mouth every 4 (four)  hours as needed for indigestion. 355 mL 0   Aromatic Inhalants (VICKS VAPOINHALER) INHA Inhale 1 puff into the lungs daily as needed (congestion).     ascorbic acid (VITAMIN C) 500 MG tablet Take 500 mg by mouth in the morning.     buPROPion (WELLBUTRIN XL) 300 MG 24 hr tablet Take 300 mg by mouth daily after breakfast.     busPIRone (BUSPAR) 10 MG tablet Take 20 mg by mouth in the morning and at bedtime.     calcium carbonate (TUMS - DOSED IN MG ELEMENTAL CALCIUM) 500 MG chewable tablet Chew 1 tablet (200 mg of elemental calcium total) by mouth 2  (two) times daily as needed for indigestion or heartburn. (Patient taking differently: Chew 1-2 tablets by mouth 2 (two) times daily as needed for indigestion or heartburn.)     Cholecalciferol (VITAMIN D3) 125 MCG (5000 UT) CAPS Take 5,000 Units by mouth every other day.     ciclopirox (LOPROX) 0.77 % cream Apply 1 application  topically at bedtime. Apply to the fingernails     escitalopram (LEXAPRO) 20 MG tablet Take 20 mg by mouth at bedtime.     fluorouracil CALGB 02774 1,920 mg/m2 in sodium chloride 0.9 % 150 mL Inject 1,920 mg/m2 into the vein over 48 hr.     lactose free nutrition (BOOST) LIQD Take 237 mLs by mouth 2 (two) times daily between meals.     lactulose (CHRONULAC) 10 GM/15ML solution Take 15 mLs (10 g total) by mouth at bedtime. Take 15 ml at bedtime every night to assist with bowel movements. If bowel movement has not occurred in 3-4 days take 15 ml every 3 hours until bowel movement has occurred. 236 mL 0   LEUCOVORIN CALCIUM IV Inject into the vein every 14 (fourteen) days.     lidocaine-prilocaine (EMLA) cream Apply a small amount to port a cath site (do not rub in) and cover with plastic wrap 1 hour prior to infusion appointments 30 g 3   linaclotide (LINZESS) 72 MCG capsule Take 1 capsule (72 mcg total) by mouth daily before breakfast. 30 capsule 12   metoprolol succinate (TOPROL-XL) 25 MG 24 hr tablet Take 25 mg by mouth daily after breakfast.     mirtazapine (REMERON) 30 MG tablet Take 30 mg by mouth at bedtime.     omeprazole (PRILOSEC) 40 MG capsule Take 1 capsule (40 mg total) by mouth daily. 30 capsule 12   ondansetron (ZOFRAN-ODT) 8 MG disintegrating tablet Take 1 tablet (8 mg total) by mouth every 8 (eight) hours as needed for nausea or vomiting. 30 tablet 0   OXALIPLATIN IV Inject into the vein every 14 (fourteen) days.     oxyCODONE (OXY IR/ROXICODONE) 5 MG immediate release tablet Take 1 tablet (5 mg total) by mouth every 6 (six) hours as needed for severe pain. 30  tablet 0   polyethylene glycol powder (MIRALAX) 17 GM/SCOOP powder Take 17 g by mouth daily. Hold for diarrhea/loose stools     prochlorperazine (COMPAZINE) 10 MG tablet Take 1 tablet (10 mg total) by mouth every 6 (six) hours as needed for nausea or vomiting (Nausea or vomiting). 60 tablet 3   QUEtiapine (SEROQUEL) 200 MG tablet Take 200 mg by mouth at bedtime.     QUEtiapine (SEROQUEL) 25 MG tablet Take 25 mg by mouth 3 (three) times daily after meals. (In the morning, 1300 & 1800)     sodium chloride (OCEAN) 0.65 % SOLN nasal spray Place 1 spray  into both nostrils as needed for congestion.     vitamin B-12 (CYANOCOBALAMIN) 500 MCG tablet Take 1 tablet (500 mcg total) by mouth daily. 30 tablet 2   Zinc 25 MG TABS Take 25 mg by mouth daily.     No current facility-administered medications for this visit.   Facility-Administered Medications Ordered in Other Visits  Medication Dose Route Frequency Provider Last Rate Last Admin   0.9 %  sodium chloride infusion   Intravenous Continuous [name], MD   Stopped at 11/24/21 1630   fluorouracil (ADRUCIL) 2,950 mg in sodium chloride 0.9 % 91 mL chemo infusion  1,920 mg/m2 (Treatment Plan Recorded) Intravenous 1 day or 1 dose [name], MD   Infusion Verify at 11/24/21 1652   sodium chloride flush (NS) 0.9 % injection 10 mL  10 mL Intravenous PRN [name], MD   10 mL at 11/24/21 0804    ALLERGIES:  Allergies  Allergen Reactions   Other Hives and Other (See Comments)    Cherry wood just cut- "smelled it and broke out"; cannot tolerate ANY cherry fragrances, either      Cherry Hives   Wound Dressing Adhesive Rash and Other (See Comments)    Band-Aids = local reaction    PHYSICAL EXAM:  Performance status (ECOG): 1 - Symptomatic but completely ambulatory  Vitals:   11/24/21 0913  BP: (!) 103/59  Pulse: 82  Resp: 18  Temp: 97.7 F (36.5 C)  SpO2: 97%    Wt Readings from Last 3 Encounters:  11/24/21  116 lb 1.6 oz (52.7 kg)  11/09/21 118 lb 12.8 oz (53.9 kg)  10/28/21 115 lb 11.2 oz (52.5 kg)   Physical Exam Vitals reviewed.  Constitutional:      Appearance: Normal appearance.  Cardiovascular:     Rate and Rhythm: Normal rate and regular rhythm.     Pulses: Normal pulses.     Heart sounds: Normal heart sounds.  Pulmonary:     Effort: Pulmonary effort is normal.     Breath sounds: Normal breath sounds.  Neurological:     General: No focal deficit present.     Mental Status: She is alert and oriented to person, place, and time.  Psychiatric:        Mood and Affect: Mood normal.        Behavior: Behavior normal.      LABORATORY DATA:  I have reviewed the labs as listed.     Latest Ref Rng & Units 11/24/2021    8:32 AM 11/09/2021    8:16 AM 10/28/2021    8:11 AM  CBC  WBC 4.0 - 10.5 K/uL 12.5  12.8  11.7   Hemoglobin 12.0 - 15.0 g/dL 11.5  11.7  12.1   Hematocrit 36.0 - 46.0 % 35.1  35.9  37.5   Platelets 150 - 400 K/uL 201  158  141       Latest Ref Rng & Units 11/24/2021    8:32 AM 11/09/2021    8:16 AM 10/28/2021    8:11 AM  CMP  Glucose 70 - 99 mg/dL 108  104  111   BUN 8 - 23 mg/dL _0 Creatinine 0.44 - 1.00 mg/dL 0.79  0.81  0.83   Sodium 135 - 145 mmol/L 140  140  139   Potassium 3.5 - 5.1 mmol/L 3.8  3.9  3.6   Chloride 98 - 111 mmol/L 106  105  105   CO2  22 - 32 mmol/L _0 Calcium 8.9 - 10.3 mg/dL 9.1  9.3  9.2   Total Protein 6.5 - 8.1 g/dL 6.5  6.5  6.8   Total Bilirubin 0.3 - 1.2 mg/dL 0.3  0.2  0.3   Alkaline Phos 38 - 126 U/L 119  143  138   AST 15 - 41 U/L 46  37  45   ALT 0 - 44 U/L 58  48  68     DIAGNOSTIC IMAGING:  I have independently reviewed the scans and discussed with the patient.    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 - EGD/EUS (07/09/2021) by Dr. Rush Landmark: The region where  the PD is strictured, darker appearance was identified.  There was no clear hypoechoic mass.  Endosonographic appearance suggestive of either chronic pancreatitis changes versus possible malignancy.  Endosonographic staging T1 N0 MX.  Pancreatic parenchymal abnormalities consisting of atrophy and hyperechoic strands.  No sign of significant pathology in the CBD.  No malignant appearing lymph nodes in the celiac region, peripancreatic region and porta hepatic region - Pathology (pancreatic duct FNA): Malignant cells consistent with adenocarcinoma - CA 19-9: 31 (0-35) - CTAP (07/13/2021): Fluid collection extending posterior to the gastric antrum and ventral to the pancreas.  Elongated collection extends to the greater curvature of the stomach with inflammation surrounding the collection suggestive of a pancreatic pseudocyst.  Chronic dilatation of pancreatic duct with abrupt termination of the duct dilatation in the head of the pancreas. - CT chest (07/16/2021): No evidence of metastatic disease in the chest. - 8 to 9 pound weight loss in the last 3 months, decreased appetite.       - PET scan (07/30/2021): Faint focus of increased metabolic activity, SUV 3.6, measuring 9 mm at the junction of the pancreatic head and body, suspicious for pancreatic adenocarcinoma.  No obvious extrapancreatic spread of tumor.  Cystic lesion along the posterior margin of the stomach and anterior margin of the pancreatic head favored pseudocyst.  Small amount of pelvic ascites. - MRI of the abdomen pancreatic protocol (08/03/2021): Similar appearance of the pancreas, with abrupt transition from dilated to decompressed duct in the pancreatic head with no evidence of underlying mass.  Pseudocyst enlarged position between pancreatic head and GE junction with significant mass effect upon the pyloric region and gastric outlet obstruction radiologically.  No evidence of abdominal metastatic disease. - FOLFOX on 09/16/2021, FOLFIRINOX cycle  2 on 09/30/2021    Social/family history: - She is accompanied by her son [name] today.  She lives at home by herself.  She is independent of ADLs and IADLs.  She did office work prior to retirement.  Non-smoker and nonalcoholic. - Brother (alcoholic) and maternal half uncle had pancreatitis.  No history of malignancy.  3. Pulmonary embolism: - She was diagnosed with pulmonary embolism in January 2023 and then will hospital.  There does not appear to be any provoking factors for PE.  Reportedly Doppler was negative for DVT.  She was treated with Eliquis for 60 days, discontinued after that.   PLAN:  Stage I (T1 N0 MX) pancreatic adenocarcinoma: - She tolerated last cycle reasonably well. - She has cold sensitivity lasted about 12 days. - Reviewed labs today which showed mildly elevated AST and ALT with normal bilirubin.  CBC was grossly normal.  CA 19-9 was 18. - Proceed  with cycle 6 today with decreased dose oxaliplatin. - RTC 2 weeks for follow-up with CT pancreatic protocol. - We will also arrange follow-up with Dr. Zenia Resides for surgery.  2.  Weight loss: - She lost 2 pounds since last visit although she gained 3 pounds 2 weeks ago. - She was encouraged to eat better and not lose weight.    Orders placed this encounter:  Orders Placed This Encounter  Procedures   CT Abdomen Pelvis [place], MD [facility] [phone]

## 2021-11-24 NOTE — Progress Notes (Signed)
Patient has been examined by Dr. Katragadda, and vital signs and labs have been reviewed. ANC, Creatinine, LFTs, hemoglobin, and platelets are within treatment parameters per M.D. - pt may proceed with treatment.  Primary RN and pharmacy notified.  

## 2021-11-24 NOTE — Patient Instructions (Addendum)
[place] at [facility] Discharge Instructions   You were seen and examined today by Dr. Delton Coombes.  He reviewed the results of your lab work which are normal/stable.   We will proceed with your treatment today.  We will obtain a scan prior to your next visit.   You may get the RSV and Covid vaccines.   We will refer you back to Dr. Zenia Resides for surgical consult.   Return as scheduled.    Thank you for choosing [place] at [facility] to provide your oncology and hematology care.  To afford each patient quality time with our provider, please arrive at least 15 minutes before your scheduled appointment time.   If you have a lab appointment with the [facility] please come in thru the Main Entrance and check in at the main information desk.  You need to re-schedule your appointment should you arrive 10 or more minutes late.  We strive to give you quality time with our providers, and arriving late affects you and other patients whose appointments are after yours.  Also, if you no show three or more times for appointments you may be dismissed from the clinic at the providers discretion.     Again, thank you for choosing [facility].  Our hope is that these requests will decrease the amount of time that you wait before being seen by our physicians.       _____________________________________________________________  Should you have questions after your visit to [facility], please contact our office at [phone] and follow the prompts.  Our office hours are 8:00 a.m. and 4:30 p.m. Monday - Friday.  Please note that voicemails left after 4:00 p.m. may not be returned until the following business day.  We are closed weekends and major holidays.  You do have access to a nurse 24-7, just call the main number to the clinic [phone] and do not press any options, hold on the line and a nurse will answer the phone.     For prescription refill requests, have your pharmacy contact our office and allow 72 hours.    Due to Covid, you will need to wear a mask upon entering the hospital. If you do not have a mask, a mask will be given to you at the Main Entrance upon arrival. For doctor visits, patients may have 1 support person age 1 or older with them. For treatment visits, patients can not have anyone with them due to social distancing guidelines and our immunocompromised population.

## 2021-11-24 NOTE — Progress Notes (Signed)
Pt presents today for Folfirinox per provider's order. Vital signs and labs WNL for treatment. Okay to proceed with treatment today per Dr.K.  Folfirinox given today per MD orders. Tolerated infusion without adverse affects. Vital signs stable. No complaints at this time. Discharged from clinic ambulatory in stable condition. Alert and oriented x 3. F/U with [facility] as scheduled. 5FU ambulatory pump infusing.

## 2021-11-25 ENCOUNTER — Encounter: Payer: Self-pay | Admitting: Lab

## 2021-11-25 ENCOUNTER — Other Ambulatory Visit: Payer: Self-pay

## 2021-11-25 NOTE — Progress Notes (Unsigned)
Referral sent to [facility] Dr Zenia Resides.  Records faxed on 11/1

## 2021-11-26 ENCOUNTER — Inpatient Hospital Stay: Payer: Medicare Other | Attending: Hematology

## 2021-11-26 VITALS — BP 113/71 | HR 83 | Temp 97.2°F | Resp 18

## 2021-11-26 DIAGNOSIS — Z95828 Presence of other vascular implants and grafts: Secondary | ICD-10-CM

## 2021-11-26 DIAGNOSIS — C253 Malignant neoplasm of pancreatic duct: Secondary | ICD-10-CM | POA: Diagnosis not present

## 2021-11-26 DIAGNOSIS — C250 Malignant neoplasm of head of pancreas: Secondary | ICD-10-CM

## 2021-11-26 DIAGNOSIS — Z5189 Encounter for other specified aftercare: Secondary | ICD-10-CM | POA: Diagnosis not present

## 2021-11-26 MED ORDER — HEPARIN SOD (PORK) LOCK FLUSH 100 UNIT/ML IV SOLN
500.0000 [IU] | Freq: Once | INTRAVENOUS | Status: AC | PRN
  Administered 2021-11-26: 500 [IU]

## 2021-11-26 MED ORDER — SODIUM CHLORIDE 0.9% FLUSH
10.0000 mL | INTRAVENOUS | Status: DC | PRN
  Administered 2021-11-26: 10 mL

## 2021-11-26 MED ORDER — PEGFILGRASTIM-JMDB 6 MG/0.6ML ~~LOC~~ SOSY
6.0000 mg | PREFILLED_SYRINGE | Freq: Once | SUBCUTANEOUS | Status: AC
  Administered 2021-11-26: 6 mg via SUBCUTANEOUS
  Filled 2021-11-26: qty 0.6

## 2021-11-26 NOTE — Progress Notes (Signed)
Patient presents today for 5FU pump stop and disconnection after 46 hour continous infusion.   5FU pump deaccessed.  Patients port flushed without difficulty.  Good blood return noted with no bruising or swelling noted at site.  Needle removed intact.  Band aid applied.  Fulphila administration without incident; injection site WNL; see MAR for injection details. VSS with discharge and left in satisfactory condition via wheelchair with no s/s of distress noted.

## 2021-11-26 NOTE — Patient Instructions (Signed)
[facility]  Discharge Instructions: Thank you for choosing [facility] to provide your oncology and hematology care.  If you have a lab appointment with the [facility], please come in thru the Main Entrance and check in at the main information desk.  Wear comfortable clothing and clothing appropriate for easy access to any Portacath or PICC line.   We strive to give you quality time with your provider. You may need to reschedule your appointment if you arrive late (15 or more minutes).  Arriving late affects you and other patients whose appointments are after yours.  Also, if you miss three or more appointments without notifying the office, you may be dismissed from the clinic at the provider's discretion.      For prescription refill requests, have your pharmacy contact our office and allow 72 hours for refills to be completed.    Today you received the following chemotherapy and/or immunotherapy agents Pump dc      To help prevent nausea and vomiting after your treatment, we encourage you to take your nausea medication as directed.  BELOW ARE SYMPTOMS THAT SHOULD BE REPORTED IMMEDIATELY: *FEVER GREATER THAN 100.4 F (38 C) OR HIGHER *CHILLS OR SWEATING *NAUSEA AND VOMITING THAT IS NOT CONTROLLED WITH YOUR NAUSEA MEDICATION *UNUSUAL SHORTNESS OF BREATH *UNUSUAL BRUISING OR BLEEDING *URINARY PROBLEMS (pain or burning when urinating, or frequent urination) *BOWEL PROBLEMS (unusual diarrhea, constipation, pain near the anus) TENDERNESS IN MOUTH AND THROAT WITH OR WITHOUT PRESENCE OF ULCERS (sore throat, sores in mouth, or a toothache) UNUSUAL RASH, SWELLING OR PAIN  UNUSUAL VAGINAL DISCHARGE OR ITCHING   Items with * indicate a potential emergency and should be followed up as soon as possible or go to the Emergency Department if any problems should occur.  Please show the CHEMOTHERAPY ALERT CARD or IMMUNOTHERAPY ALERT CARD at check-in to the Emergency  Department and triage nurse.  Should you have questions after your visit or need to cancel or reschedule your appointment, please contact [facility] [phone]  and follow the prompts.  Office hours are 8:00 a.m. to 4:30 p.m. Monday - Friday. Please note that voicemails left after 4:00 p.m. may not be returned until the following business day.  We are closed weekends and major holidays. You have access to a nurse at all times for urgent questions. Please call the main number to the clinic [phone] and follow the prompts.  For any non-urgent questions, you may also contact your provider using MyChart. We now offer e-Visits for anyone 54 and older to request care online for non-urgent symptoms. For details visit [email or web address].   Also download the MyChart app! Go to the app store, search "MyChart", open the app, select [facility], and log in with your MyChart username and password.  Masks are optional in the cancer centers. If you would like for your care team to wear a mask while they are taking care of you, please let them know. You may have one support person who is at least 78 years old accompany you for your appointments.

## 2021-12-03 DIAGNOSIS — K861 Other chronic pancreatitis: Secondary | ICD-10-CM

## 2021-12-08 ENCOUNTER — Ambulatory Visit (HOSPITAL_COMMUNITY)
Admission: RE | Admit: 2021-12-08 | Discharge: 2021-12-08 | Disposition: A | Discharge status: Home / Self Care | Payer: Medicare Other | Source: Ambulatory Visit | Attending: Hematology | Admitting: Hematology

## 2021-12-08 ENCOUNTER — Encounter (HOSPITAL_COMMUNITY): Payer: Self-pay | Admitting: Radiology

## 2021-12-08 DIAGNOSIS — C250 Malignant neoplasm of head of pancreas: Secondary | ICD-10-CM | POA: Diagnosis present

## 2021-12-08 MED ORDER — IOHEXOL 300 MG/ML  SOLN
100.0000 mL | Freq: Once | INTRAMUSCULAR | Status: AC | PRN
  Administered 2021-12-08: 100 mL via INTRAVENOUS

## 2021-12-15 ENCOUNTER — Inpatient Hospital Stay (HOSPITAL_BASED_OUTPATIENT_CLINIC_OR_DEPARTMENT_OTHER): Payer: Medicare Other | Admitting: Hematology

## 2021-12-15 VITALS — BP 115/71 | HR 82 | Temp 97.8°F | Resp 18 | Ht 63.39 in | Wt 115.7 lb

## 2021-12-15 DIAGNOSIS — C250 Malignant neoplasm of head of pancreas: Secondary | ICD-10-CM | POA: Diagnosis not present

## 2021-12-15 DIAGNOSIS — C253 Malignant neoplasm of pancreatic duct: Secondary | ICD-10-CM | POA: Diagnosis not present

## 2021-12-15 NOTE — Progress Notes (Signed)
[place], [place] [address]   CLINIC:  Medical Oncology/Hematology  PCP:  Sherrilee Gilles, DO [address] 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 VUS in ATM called c.4367G>A and in [facility] called c.238G>A also identified. The remainder of testing was negative/normal. The report date is 09/20/2021.  The CancerNext-Expanded + RNAinsight gene panel offered by [employer] and includes sequencing and rearrangement analysis for the following 77 genes: IP, ALK, APC*, ATM*, AXIN2, BAP1, BARD1, BLM, BMPR1A, BRCA1*, BRCA2*, BRIP1*, CDC73, CDH1*,CDK4, CDKN1B, CDKN2A, CHEK2*, CTNNA1, DICER1, FANCC, FH, FLCN, GALNT12, KIF1B, LZTR1, MAX, MEN1, MET, MLH1*, MSH2*, MSH3, MSH6*, MUTYH*, NBN, NF1*, NF2, NTHL1, PALB2*, PHOX2B, PMS2*, POT1, PRKAR1A, PTCH1, PTEN*, RAD51C*, RAD51D*,RB1, RECQL, RET, SDHA, SDHAF2, SDHB, SDHC, SDHD, SMAD4, SMARCA4, SMARCB1, SMARCE1, STK11, SUFU, TMEM127, TP53*,TSC1, TSC2, VHL and XRCC2 (sequencing and deletion/duplication); EGFR, EGLN1, HOXB13, KIT, MITF, PDGFRA, POLD1 and POLE (sequencing only); EPCAM and GREM1 (deletion/duplication only).     CANCER STAGING:  Cancer Staging  Malignant neoplasm of pancreas [facility]) Staging form: Exocrine Pancreas, AJCC 8th Edition - Clinical stage from 07/29/2021: Stage IA  (cT1, cN0, cM0) - Unsigned   INTERVAL HISTORY:  [name], a 76 y.o. female, seen for follow-up of pancreatic cancer.  She has completed 6 cycles of FOLFIRINOX chemotherapy on 11/24/2021.  She reports energy levels are 50%.  REVIEW OF SYSTEMS:  Review of Systems  Constitutional:  Negative for appetite change and fatigue.  Gastrointestinal:  Positive for constipation and diarrhea.  Psychiatric/Behavioral:  Positive for sleep disturbance.   All other systems reviewed and are negative.   PAST MEDICAL/SURGICAL HISTORY:  Past Medical History:  Diagnosis Date   Anxiety    Arthritis    Depression    Dysrhythmia    hx palpitations greater than 5 yrs -neg echo, stress per pt ? where or dr   GERD (gastroesophageal reflux disease)    occ   Pancreatic adenocarcinoma ([place])    Pancreatic pseudocyst    Port-A-Cath in place 09/15/2021   Pulmonary embolism [facility])    September 2022   Past Surgical History:  Procedure Laterality Date   ANTERIOR LAT LUMBAR FUSION Right 12/16/2015   Procedure: RIGHT LUMBAR TWO-THREE, LUMBAR THREE-FOUR, LUMBAR FOUR-FIVE ANTEROLATERAL LUMBAR INTERBODY FUSION;  Surgeon: Erline Levine, MD;  Location: [facility];  Service: Neurosurgery;  Laterality: Right;   BALLOON DILATION N/A 08/27/2021   Procedure: BALLOON DILATION;  Surgeon: Rush Landmark Telford Nab., MD;  Location: [facility];  Service: Gastroenterology;  Laterality: N/A;   BIOPSY  07/09/2021   Procedure: BIOPSY;  Surgeon: Rush Landmark Telford Nab., MD;  Location: [facility];  Service: Gastroenterology;;   CYST GASTROSTOMY  08/27/2021   Procedure: CYST GASTROSTOMY;  Surgeon: Irving Copas., MD;  Location: [facility];  Service: Gastroenterology;;   ESOPHAGOGASTRODUODENOSCOPY N/A 07/09/2021   Procedure: ESOPHAGOGASTRODUODENOSCOPY (EGD);  Surgeon: Rush Landmark,  Telford Nab., MD;  Location[facility];  Service: Gastroenterology;  Laterality: N/A;   ESOPHAGOGASTRODUODENOSCOPY (EGD) WITH PROPOFOL N/A 08/27/2021    Procedure: ESOPHAGOGASTRODUODENOSCOPY (EGD) WITH PROPOFOL;  Surgeon: Rush Landmark Telford Nab., MD;  Location: [facility];  Service: Gastroenterology;  Laterality: N/A;   ESOPHAGOGASTRODUODENOSCOPY (EGD) WITH PROPOFOL N/A 10/08/2021   Procedure: ESOPHAGOGASTRODUODENOSCOPY (EGD) WITH PROPOFOL;  Surgeon: Rush Landmark Telford Nab., MD;  Location: [facility];  Service: Gastroenterology;  Laterality: N/A;   EUS N/A 07/09/2021   Procedure: UPPER ENDOSCOPIC ULTRASOUND (EUS) RADIAL;  Surgeon: Irving Copas., MD;  Location: [facility];  Service: Gastroenterology;  Laterality: N/A;   EUS N/A 08/27/2021   Procedure: UPPER ENDOSCOPIC ULTRASOUND (EUS) LINEAR;  Surgeon: Irving Copas., MD;  Location: [facility];  Service: Gastroenterology;  Laterality: N/A;   FINE NEEDLE ASPIRATION  07/09/2021   Procedure: FINE NEEDLE ASPIRATION (FNA) LINEAR;  Surgeon: Irving Copas., MD;  Location: [facility];  Service: Gastroenterology;;   IR IMAGING GUIDED PORT INSERTION  09/14/2021   LUMBAR PERCUTANEOUS PEDICLE SCREW 3 LEVEL Bilateral 12/16/2015   Procedure: PERCUTANEOUS PEDICLE SCREWS BILATERALLY AT LUMBAR TWO-FIVE;  Surgeon: Erline Levine, MD;  Location: [place];  Service: Neurosurgery;  Laterality: Bilateral;   PANCREATIC STENT PLACEMENT  08/27/2021   Procedure: PANCREATIC STENT PLACEMENT;  Surgeon: Rush Landmark Telford Nab., MD;  Location: [facility];  Service: Gastroenterology;;   Lavell Islam REMOVAL  10/08/2021   Procedure: STENT REMOVAL;  Surgeon: Irving Copas., MD;  Location: [facility];  Service: Gastroenterology;;   TUBAL LIGATION  1974    SOCIAL HISTORY:  Social History   Socioeconomic History   Marital status: Widowed    Spouse name: Not on file   Number of children: Not on file   Years of education: Not on file   Highest education level: Not on file  Occupational History   Not on file  Tobacco Use   Smoking status: Never   Smokeless tobacco: Never  Substance and Sexual  Activity   Alcohol use: No   Drug use: Never   Sexual activity: Not on file  Other Topics Concern   Not on file  Social History Narrative   Not on file   Social Determinants of Health   Financial Resource Strain: Not on file  Food Insecurity: Not on file  Transportation Needs: Not on file  Physical Activity: Not on file  Stress: Not on file  Social Connections: Not on file  Intimate Partner Violence: Not on file    FAMILY HISTORY:  Family History  Problem Relation Age of Onset   Pancreatitis Brother        alcoholic pancreatitis   Pancreatic cancer Neg Hx    Colon cancer Neg Hx     CURRENT MEDICATIONS:  Current Outpatient Medications  Medication Sig Dispense Refill   acetaminophen (TYLENOL) 500 MG tablet Take 2 tablets (1,000 mg total) by mouth every 8 (eight) hours as needed for moderate pain, headache or fever. (Patient taking differently: Take 250-1,000 mg by mouth every 8 (eight) hours as needed for moderate pain, headache or fever.)     alum & mag hydroxide-simeth (MAALOX/MYLANTA) 200-200-20 MG/5ML suspension Take 30 mLs by mouth every 4 (four) hours as needed for indigestion. 355 mL 0   Aromatic Inhalants (VICKS VAPOINHALER) INHA Inhale 1 puff into the lungs daily as needed (congestion).     ascorbic acid (VITAMIN C) 500 MG tablet Take 500 mg by mouth in the morning.     buPROPion (WELLBUTRIN XL) 300 MG 24 hr  tablet Take 300 mg by mouth daily after breakfast.     busPIRone (BUSPAR) 10 MG tablet Take 20 mg by mouth in the morning and at bedtime.     calcium carbonate (TUMS - DOSED IN MG ELEMENTAL CALCIUM) 500 MG chewable tablet Chew 1 tablet (200 mg of elemental calcium total) by mouth 2 (two) times daily as needed for indigestion or heartburn. (Patient taking differently: Chew 1-2 tablets by mouth 2 (two) times daily as needed for indigestion or heartburn.)     Cholecalciferol (VITAMIN D3) 125 MCG (5000 UT) CAPS Take 5,000 Units by mouth every other day.     ciclopirox  (LOPROX) 0.77 % cream Apply 1 application  topically at bedtime. Apply to the fingernails     escitalopram (LEXAPRO) 20 MG tablet Take 20 mg by mouth at bedtime.     fluorouracil CALGB 83419 1,920 mg/m2 in sodium chloride 0.9 % 150 mL Inject 1,920 mg/m2 into the vein over 48 hr.     lactose free nutrition (BOOST) LIQD Take 237 mLs by mouth 2 (two) times daily between meals.     lactulose (CHRONULAC) 10 GM/15ML solution Take 15 mLs (10 g total) by mouth at bedtime. Take 15 ml at bedtime every night to assist with bowel movements. If bowel movement has not occurred in 3-4 days take 15 ml every 3 hours until bowel movement has occurred. 236 mL 0   LEUCOVORIN CALCIUM IV Inject into the vein every 14 (fourteen) days.     lidocaine-prilocaine (EMLA) cream Apply a small amount to port a cath site (do not rub in) and cover with plastic wrap 1 hour prior to infusion appointments 30 g 3   linaclotide (LINZESS) 72 MCG capsule Take 1 capsule (72 mcg total) by mouth daily before breakfast. 30 capsule 12   metoprolol succinate (TOPROL-XL) 25 MG 24 hr tablet Take 25 mg by mouth daily after breakfast.     mirtazapine (REMERON) 30 MG tablet Take 30 mg by mouth at bedtime.     omeprazole (PRILOSEC) 40 MG capsule Take 1 capsule (40 mg total) by mouth daily. 30 capsule 12   ondansetron (ZOFRAN-ODT) 8 MG disintegrating tablet Take 1 tablet (8 mg total) by mouth every 8 (eight) hours as needed for nausea or vomiting. 30 tablet 0   OXALIPLATIN IV Inject into the vein every 14 (fourteen) days.     oxyCODONE (OXY IR/ROXICODONE) 5 MG immediate release tablet Take 1 tablet (5 mg total) by mouth every 6 (six) hours as needed for severe pain. 30 tablet 0   polyethylene glycol powder (MIRALAX) 17 GM/SCOOP powder Take 17 g by mouth daily. Hold for diarrhea/loose stools     prochlorperazine (COMPAZINE) 10 MG tablet Take 1 tablet (10 mg total) by mouth every 6 (six) hours as needed for nausea or vomiting (Nausea or vomiting). 60  tablet 3   QUEtiapine (SEROQUEL) 200 MG tablet Take 200 mg by mouth at bedtime.     QUEtiapine (SEROQUEL) 25 MG tablet Take 25 mg by mouth 3 (three) times daily after meals. (In the morning, 1300 & 1800)     sodium chloride (OCEAN) 0.65 % SOLN nasal spray Place 1 spray into both nostrils as needed for congestion.     vitamin B-12 (CYANOCOBALAMIN) 500 MCG tablet Take 1 tablet (500 mcg total) by mouth daily. 30 tablet 2   Zinc 25 MG TABS Take 25 mg by mouth daily.     No current facility-administered medications for this visit.  ALLERGIES:  Allergies  Allergen Reactions   Other Hives and Other (See Comments)    Cherry wood just cut- "smelled it and broke out"; cannot tolerate ANY cherry fragrances, either      Cherry Hives   Wound Dressing Adhesive Rash and Other (See Comments)    Band-Aids = local reaction    PHYSICAL EXAM:  Performance status (ECOG): 1 - Symptomatic but completely ambulatory  There were no vitals filed for this visit.   Wt Readings from Last 3 Encounters:  11/24/21 116 lb 1.6 oz (52.7 kg)  11/09/21 118 lb 12.8 oz (53.9 kg)  10/28/21 115 lb 11.2 oz (52.5 kg)   Physical Exam Vitals reviewed.  Constitutional:      Appearance: Normal appearance.  Cardiovascular:     Rate and Rhythm: Normal rate and regular rhythm.     Pulses: Normal pulses.     Heart sounds: Normal heart sounds.  Pulmonary:     Effort: Pulmonary effort is normal.     Breath sounds: Normal breath sounds.  Neurological:     General: No focal deficit present.     Mental Status: She is alert and oriented to person, place, and time.  Psychiatric:        Mood and Affect: Mood normal.        Behavior: Behavior normal.     LABORATORY DATA:  I have reviewed the labs as listed.     Latest Ref Rng & Units 11/24/2021    8:32 AM 11/09/2021    8:16 AM 10/28/2021    8:11 AM  CBC  WBC 4.0 - 10.5 K/uL 12.5  12.8  11.7   Hemoglobin 12.0 - 15.0 g/dL 11.5  11.7  12.1   Hematocrit 36.0 - 46.0 %  35.1  35.9  37.5   Platelets 150 - 400 K/uL 201  158  141       Latest Ref Rng & Units 11/24/2021    8:32 AM 11/09/2021    8:16 AM 10/28/2021    8:11 AM  CMP  Glucose 70 - 99 mg/dL 108  104  111   BUN 8 - 23 mg/dL _0 Creatinine 0.44 - 1.00 mg/dL 0.79  0.81  0.83   Sodium 135 - 145 mmol/L 140  140  139   Potassium 3.5 - 5.1 mmol/L 3.8  3.9  3.6   Chloride 98 - 111 mmol/L 106  105  105   CO2 22 - 32 mmol/L _1 Calcium 8.9 - 10.3 mg/dL 9.1  9.3  9.2   Total Protein 6.5 - 8.1 g/dL 6.5  6.5  6.8   Total Bilirubin 0.3 - 1.2 mg/dL 0.3  0.2  0.3   Alkaline Phos 38 - 126 U/L 119  143  138   AST 15 - 41 U/L 46  37  45   ALT 0 - 44 U/L 58  48  68     DIAGNOSTIC IMAGING:  I have independently reviewed the scans and discussed with the patient.    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 - EGD/EUS (07/09/2021) by Dr. Rush Landmark: The region where the PD is strictured, darker appearance was identified.  There was no clear hypoechoic mass.  Endosonographic appearance suggestive of either chronic pancreatitis changes versus possible malignancy.  Endosonographic staging T1 N0 MX.  Pancreatic  parenchymal abnormalities consisting of atrophy and hyperechoic strands.  No sign of significant pathology in the CBD.  No malignant appearing lymph nodes in the celiac region, peripancreatic region and porta hepatic region - Pathology (pancreatic duct FNA): Malignant cells consistent with adenocarcinoma - CA 19-9: 31 (0-35) - CTAP (07/13/2021): Fluid collection extending posterior to the gastric antrum and ventral to the pancreas.  Elongated collection extends to the greater curvature of the stomach with inflammation surrounding the collection suggestive of a pancreatic pseudocyst.  Chronic dilatation of pancreatic duct with abrupt termination of the duct dilatation  in the head of the pancreas. - CT chest (07/16/2021): No evidence of metastatic disease in the chest. - 8 to 9 pound weight loss in the last 3 months, decreased appetite.       - PET scan (07/30/2021): Faint focus of increased metabolic activity, SUV 3.6, measuring 9 mm at the junction of the pancreatic head and body, suspicious for pancreatic adenocarcinoma.  No obvious extrapancreatic spread of tumor.  Cystic lesion along the posterior margin of the stomach and anterior margin of the pancreatic head favored pseudocyst.  Small amount of pelvic ascites. - MRI of the abdomen pancreatic protocol (08/03/2021): Similar appearance of the pancreas, with abrupt transition from dilated to decompressed duct in the pancreatic head with no evidence of underlying mass.  Pseudocyst enlarged position between pancreatic head and GE junction with significant mass effect upon the pyloric region and gastric outlet obstruction radiologically.  No evidence of abdominal metastatic disease. - FOLFOX on 09/16/2021, FOLFIRINOX cycle 2 on 09/30/2021    Social/family history: - She is accompanied by her son [name] today.  She lives at home by herself.  She is independent of ADLs and IADLs.  She did office work prior to retirement.  Non-smoker and nonalcoholic. - Brother (alcoholic) and maternal half uncle had pancreatitis.  No history of malignancy.  3. Pulmonary embolism: - She was diagnosed with pulmonary embolism in January 2023 and then will hospital.  There does not appear to be any provoking factors for PE.  Reportedly Doppler was negative for DVT.  She was treated with Eliquis for 60 days, discontinued after that.   PLAN:  Stage I (T1 N0 MX) pancreatic adenocarcinoma: - CTAP pancreatic protocol (12/08/2021): Unchanged appearance of the pancreas.  No evidence of lymphadenopathy or metastatic disease.  Previously seen large pseudocyst anterior to the Peri pancreatic head is resolved. - I have recommended follow-up with Dr.  Zenia Resides for input regarding pancreatectomy.  2.  Weight loss: - Her weight is stable. - She was encouraged to improve on eating.    Orders placed this encounter:  No orders of the defined types were placed in this encounter.       [name], MD [facility] [phone]

## 2021-12-15 NOTE — Patient Instructions (Addendum)
[place]  Discharge Instructions   You were seen and examined today by Dr. Delton Coombes.  He reviewed the results of your CT scan. It shows that the pseudocyst is gone.   Follow up with Dr. Zenia Resides as scheduled on 12/23/21.    Thank you for choosing [place] [facility] to provide your oncology and hematology care.   To afford each patient quality time with our provider, please arrive at least 15 minutes before your scheduled appointment time. You may need to reschedule your appointment if you arrive late (10 or more minutes). Arriving late affects you and other patients whose appointments are after yours.  Also, if you miss three or more appointments without notifying the office, you may be dismissed from the clinic at the provider's discretion.    Again, thank you for choosing [facility].  Our hope is that these requests will decrease the amount of time that you wait before being seen by our physicians.   If you have a lab appointment with the [facility] please come in thru the Main Entrance and check in at the main information desk.           _____________________________________________________________  Should you have questions after your visit to [facility], please contact our office at [phone] and follow the prompts.  Our office hours are 8:00 a.m. to 4:30 p.m. Monday - Thursday and 8:00 a.m. to 2:30 p.m. Friday.  Please note that voicemails left after 4:00 p.m. may not be returned until the following business day.  We are closed weekends and all major holidays.  You do have access to a nurse 24-7, just call the main number to the clinic [phone] and do not press any options, hold on the line and a nurse will answer the phone.    For prescription refill requests, have your pharmacy contact our office and allow 72 hours.    Masks are optional in the cancer centers. If you would like for your care  team to wear a mask while they are taking care of you, please let them know. You may have one support person who is at least 78 years old accompany you for your appointments.

## 2021-12-16 ENCOUNTER — Other Ambulatory Visit: Payer: Self-pay

## 2021-12-23 ENCOUNTER — Ambulatory Visit: Payer: Self-pay | Admitting: Surgery

## 2021-12-23 DIAGNOSIS — C250 Malignant neoplasm of head of pancreas: Secondary | ICD-10-CM

## 2021-12-23 MED ORDER — PIPERACILLIN SOD-TAZOBACTAM SO 2.25 (2-0.25) G IV SOLR: 4.5000 g | INTRAVENOUS | Status: AC

## 2021-12-23 MED ORDER — PIPERACILLIN-TAZOBACTAM 3.375 G IVPB 30 MIN: 3.3750 g | INTRAVENOUS | Status: DC

## 2021-12-23 NOTE — H&P (Signed)
History of Present Illness: Sheryl Bender is a 76 y.o. female who is seen today for follow up of pancreatic adenocarcinoma. Briefly, she had this diagnosed in June of 2023 on EUS, which showed a stricture in the genu of the pancreatic duct but no discrete mass (stage uT1N0). She subsequently developed acute pancreatitis with a large pseudocyst. She saw me initially in consultation in July. 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 Rush Landmark. The stents were removed on 9/14. She has been undergoing systemic treatment with modified FOLFIRINOX and recently completed cycle 6 earlier this month. Recent restaging scans on 11/14 showed no evidence of disease progression or metastatic disease, and her pseudocyst had resolved. Today she says her appetite is improving. She has intermittent pain in the upper abdomen but maintained a stable weight throughout chemotherapy. She still lives independently.   CA19-9 remains normal at 18, but was never elevated.     Review of Systems: A complete review of systems was obtained from the patient.  I have reviewed this information and discussed as appropriate with the patient.  See HPI as well for other ROS.       Medical History: Past Medical History Past Medical History: Diagnosis Date  Anxiety    Arthritis    Hypertension        Patient Active Problem List Diagnosis  Pancreatic adenocarcinoma (CMS-HCC)  Acute pancreatitis     Past Surgical History Past Surgical History: Procedure Laterality Date  MINI-LAPAROTOMY W/ TUBAL LIGATION   06/1972  Back Surgery    11/2015      AllergiesExpand by Default Allergies Allergen Reactions  Lampeter wood just cut- "smelled it and broke out"  Adhesive Rash     bandades-local reaction      Current Outpatient Medications on  File Prior to Visit Medication Sig Dispense Refill  buPROPion (WELLBUTRIN XL) 300 MG XL tablet Take by mouth      busPIRone (BUSPAR) 15 MG tablet Take 1 tablet by mouth 2 (two) times daily      cholecalciferol (VITAMIN D3) 5,000 unit capsule Take by mouth      cyanocobalamin (VITAMIN B12) 500 MCG tablet Take by mouth      escitalopram oxalate (LEXAPRO) 20 MG tablet Take 40 mg by mouth once daily      linaCLOtide (LINZESS) 72 mcg capsule Take by mouth      metoprolol succinate (TOPROL-XL) 25 MG XL tablet Take 1 tablet by mouth once daily      mirtazapine (REMERON) 30 MG tablet Take 30 mg by mouth at bedtime      QUEtiapine (SEROQUEL) 25 MG tablet 1/2 TABLET TWICE PER DAY AND 1 TABLET AT BEDTIME AS DIRECTED ORALLY 30 DAYS       No current facility-administered medications on file prior to visit.     Family History No family history on file.     Social History   Tobacco Use Smoking Status Never Smokeless Tobacco Never     Social History Social History    Socioeconomic History  Marital status: Widowed Tobacco Use  Smoking status: Never  Smokeless tobacco: Never Substance and Sexual Activity  Alcohol use: Not Currently  Drug use: Never      Objective:     Vitals:   12/23/21 1022 BP: 120/66 Pulse: 87 Temp: 36.4 C (97.6 F) SpO2:  97% Weight: 54.5 kg (120 lb 3.2 oz)   Body mass index is 21.98 kg/m.   Physical Exam Vitals reviewed.  Constitutional:      General: She is not in acute distress.    Appearance: Normal appearance.  HENT:     Head: Normocephalic and atraumatic.  Eyes:     General: No scleral icterus.    Conjunctiva/sclera: Conjunctivae normal.  Cardiovascular:     Rate and Rhythm: Normal rate and regular rhythm.  Pulmonary:     Effort: Pulmonary effort is normal. No respiratory distress.     Breath sounds: Normal breath sounds.  Abdominal:     General: There is no distension.     Palpations: Abdomen is soft.     Tenderness: There is no  abdominal tenderness.  Musculoskeletal:        General: No swelling or deformity. Normal range of motion.  Skin:    General: Skin is warm and dry.     Coloration: Skin is not jaundiced.  Neurological:     General: No focal deficit present.     Mental Status: She is alert and oriented to person, place, and time.              Assessment and Plan:    Diagnoses and all orders for this visit:   Pancreatic adenocarcinoma (CMS-HCC)       This is a 76 yo female with a cT1 adenocarcinoma of the head of the pancreas. I reviewed her imaging. Her newest CT scan shows no visible mass, but there is main PD dilation distal to the known PD stricture, with gland atrophy. No signs of vascular invasion. This is resectable disease and she has had no progression or evidence of metastatic disease on chemotherapy. Her previous pseudocyst has resolved. Surgical resection via a Whipple procedure was recommended. I had an extensive discussion with the patient and her son, and the details of this operation were discussed, including the benefits and risks such as bleeding, infection, 15% risk of pancreatic fistula, risk of bile leak, 10-15% risk of delayed gastric emptying, and risks of pneumonia, blood clot, renal failure, and respiratory. The patient expressed understanding and agrees to proceed with surgery. She will be scheduled in January and should complete 1-2 more cycles of chemotherapy prior to surgery, I will discuss timing with Dr. Delton Coombes.  Michaelle Birks, [facility], Hepatobiliary and Pancreatic Surgery 12/23/21 11:42 AM

## 2021-12-24 ENCOUNTER — Other Ambulatory Visit: Payer: Self-pay

## 2021-12-25 ENCOUNTER — Encounter: Payer: Self-pay | Admitting: Hematology

## 2021-12-28 ENCOUNTER — Other Ambulatory Visit: Payer: Self-pay

## 2021-12-28 DIAGNOSIS — C250 Malignant neoplasm of head of pancreas: Secondary | ICD-10-CM

## 2021-12-29 ENCOUNTER — Inpatient Hospital Stay: Payer: Medicare Other

## 2021-12-29 ENCOUNTER — Inpatient Hospital Stay (HOSPITAL_BASED_OUTPATIENT_CLINIC_OR_DEPARTMENT_OTHER): Payer: Medicare Other | Admitting: Hematology

## 2021-12-29 ENCOUNTER — Inpatient Hospital Stay: Payer: Medicare Other | Attending: Hematology

## 2021-12-29 VITALS — BP 116/59 | HR 82 | Temp 97.9°F | Resp 18

## 2021-12-29 DIAGNOSIS — C250 Malignant neoplasm of head of pancreas: Secondary | ICD-10-CM | POA: Diagnosis not present

## 2021-12-29 DIAGNOSIS — Z95828 Presence of other vascular implants and grafts: Secondary | ICD-10-CM

## 2021-12-29 DIAGNOSIS — Z5189 Encounter for other specified aftercare: Secondary | ICD-10-CM | POA: Diagnosis not present

## 2021-12-29 DIAGNOSIS — Z5111 Encounter for antineoplastic chemotherapy: Secondary | ICD-10-CM | POA: Insufficient documentation

## 2021-12-29 DIAGNOSIS — C253 Malignant neoplasm of pancreatic duct: Secondary | ICD-10-CM | POA: Diagnosis present

## 2021-12-29 LAB — CBC WITH DIFFERENTIAL/PLATELET
Abs Immature Granulocytes: 0.01 10*3/uL (ref 0.00–0.07)
Basophils Absolute: 0 10*3/uL (ref 0.0–0.1)
Basophils Relative: 0 %
Eosinophils Absolute: 0.1 10*3/uL (ref 0.0–0.5)
Eosinophils Relative: 2 %
HCT: 35 % — ABNORMAL LOW (ref 36.0–46.0)
Hemoglobin: 11.3 g/dL — ABNORMAL LOW (ref 12.0–15.0)
Immature Granulocytes: 0 %
Lymphocytes Relative: 27 %
Lymphs Abs: 1 10*3/uL (ref 0.7–4.0)
MCH: 33.3 pg (ref 26.0–34.0)
MCHC: 32.3 g/dL (ref 30.0–36.0)
MCV: 103.2 fL — ABNORMAL HIGH (ref 80.0–100.0)
Monocytes Absolute: 0.5 10*3/uL (ref 0.1–1.0)
Monocytes Relative: 13 %
Neutro Abs: 2.1 10*3/uL (ref 1.7–7.7)
Neutrophils Relative %: 58 %
Platelets: 184 10*3/uL (ref 150–400)
RBC: 3.39 MIL/uL — ABNORMAL LOW (ref 3.87–5.11)
RDW: 16.8 % — ABNORMAL HIGH (ref 11.5–15.5)
WBC: 3.6 10*3/uL — ABNORMAL LOW (ref 4.0–10.5)
nRBC: 0 % (ref 0.0–0.2)

## 2021-12-29 LAB — COMPREHENSIVE METABOLIC PANEL
ALT: 32 U/L (ref 0–44)
AST: 30 U/L (ref 15–41)
Albumin: 3.5 g/dL (ref 3.5–5.0)
Alkaline Phosphatase: 118 U/L (ref 38–126)
Anion gap: 10 (ref 5–15)
BUN: 26 mg/dL — ABNORMAL HIGH (ref 8–23)
CO2: 26 mmol/L (ref 22–32)
Calcium: 9.2 mg/dL (ref 8.9–10.3)
Chloride: 106 mmol/L (ref 98–111)
Creatinine, Ser: 0.71 mg/dL (ref 0.44–1.00)
GFR, Estimated: >60 mL/min (ref >60)
Glucose, Bld: 101 mg/dL — ABNORMAL HIGH (ref 70–99)
Potassium: 4 mmol/L (ref 3.5–5.1)
Sodium: 142 mmol/L (ref 135–145)
Total Bilirubin: 0.3 mg/dL (ref 0.3–1.2)
Total Protein: 6.6 g/dL (ref 6.5–8.1)

## 2021-12-29 LAB — MAGNESIUM: Magnesium: 2.3 mg/dL (ref 1.7–2.4)

## 2021-12-29 MED ORDER — PALONOSETRON HCL INJECTION 0.25 MG/5ML
0.2500 mg | Freq: Once | INTRAVENOUS | Status: AC
  Administered 2021-12-29: 0.25 mg via INTRAVENOUS
  Filled 2021-12-29: qty 5

## 2021-12-29 MED ORDER — SODIUM CHLORIDE 0.9% FLUSH
10.0000 mL | INTRAVENOUS | Status: DC | PRN
  Administered 2021-12-29: 10 mL

## 2021-12-29 MED ORDER — SODIUM CHLORIDE 0.9 % IV SOLN
10.0000 mg | Freq: Once | INTRAVENOUS | Status: AC
  Administered 2021-12-29: 10 mg via INTRAVENOUS
  Filled 2021-12-29: qty 10

## 2021-12-29 MED ORDER — SODIUM CHLORIDE 0.9 % IV SOLN
120.0000 mg/m2 | Freq: Once | INTRAVENOUS | Status: AC
  Administered 2021-12-29: 180 mg via INTRAVENOUS
  Filled 2021-12-29: qty 5

## 2021-12-29 MED ORDER — ATROPINE SULFATE 1 MG/ML IV SOLN
0.5000 mg | Freq: Once | INTRAVENOUS | Status: AC | PRN
  Administered 2021-12-29: 0.5 mg via INTRAVENOUS
  Filled 2021-12-29: qty 1

## 2021-12-29 MED ORDER — SODIUM CHLORIDE 0.9% FLUSH
10.0000 mL | Freq: Once | INTRAVENOUS | Status: AC
  Administered 2021-12-29: 10 mL

## 2021-12-29 MED ORDER — HEPARIN SOD (PORK) LOCK FLUSH 100 UNIT/ML IV SOLN: 500.0000 [IU] | Freq: Once | INTRAVENOUS | Status: DC | PRN

## 2021-12-29 MED ORDER — SODIUM CHLORIDE 0.9 % IV SOLN
150.0000 mg | Freq: Once | INTRAVENOUS | Status: AC
  Administered 2021-12-29: 150 mg via INTRAVENOUS
  Filled 2021-12-29: qty 5

## 2021-12-29 MED ORDER — SODIUM CHLORIDE 0.9 % IV SOLN
320.0000 mg/m2 | Freq: Once | INTRAVENOUS | Status: AC
  Administered 2021-12-29: 492 mg via INTRAVENOUS
  Filled 2021-12-29: qty 24.6

## 2021-12-29 MED ORDER — DEXTROSE 5 % IV SOLN: Freq: Once | INTRAVENOUS | Status: AC

## 2021-12-29 MED ORDER — SODIUM CHLORIDE 0.9 % IV SOLN
1920.0000 mg/m2 | INTRAVENOUS | Status: DC
  Administered 2021-12-29: 2950 mg via INTRAVENOUS
  Filled 2021-12-29: qty 59

## 2021-12-29 MED ORDER — OXALIPLATIN CHEMO INJECTION 100 MG/20ML
65.0000 mg/m2 | Freq: Once | INTRAVENOUS | Status: AC
  Administered 2021-12-29: 100 mg via INTRAVENOUS
  Filled 2021-12-29: qty 20

## 2021-12-29 NOTE — Progress Notes (Signed)
Patient tolerated chemotherapy with no complaints voiced. Side effects with management reviewed understanding verbalized. Port site clean and dry with no bruising or swelling noted at site. Good blood return noted before and after administration of chemotherapy. Chemo pump connected. Patient left in satisfactory condition with VSS and no s/s of distress noted.

## 2021-12-29 NOTE — Progress Notes (Signed)
Patient has been examined by Dr. Katragadda, and vital signs and labs have been reviewed. ANC, Creatinine, LFTs, hemoglobin, and platelets are within treatment parameters per M.D. - pt may proceed with treatment.  Primary RN and pharmacy notified.  

## 2021-12-29 NOTE — Patient Instructions (Addendum)
[place] at [facility] Discharge Instructions   You were seen and examined today by Dr. Delton Coombes.  He reviewed the results of your lab work which are normal/stable. You protein in the blood (albumin) has improved to normal today. This indicates that your nutrition is much-improved. Continue to advance your diet as tolerated.   We will proceed with your treatment today.  Dr. Raliegh Ip will reach out to Dr. Zenia Resides to see if we need to arrange for you to have another treatment in 2 weeks.   Return as scheduled.    Thank you for choosing [place] at [facility] to provide your oncology and hematology care.  To afford each patient quality time with our provider, please arrive at least 15 minutes before your scheduled appointment time.   If you have a lab appointment with the [place] please come in thru the Main Entrance and check in at the main information desk.  You need to re-schedule your appointment should you arrive 10 or more minutes late.  We strive to give you quality time with our providers, and arriving late affects you and other patients whose appointments are after yours.  Also, if you no show three or more times for appointments you may be dismissed from the clinic at the providers discretion.     Again, thank you for choosing [facility]  Our hope is that these requests will decrease the amount of time that you wait before being seen by our physicians.       _____________________________________________________________  Should you have questions after your visit to [facility], please contact our office at [phone] and follow the prompts.  Our office hours are 8:00 a.m. and 4:30 p.m. Monday - Friday.  Please note that voicemails left after 4:00 p.m. may not be returned until the following business day.  We are closed weekends and major holidays.  You do have access to a nurse 24-7, just call the main  number to the clinic [phone] and do not press any options, hold on the line and a nurse will answer the phone.    For prescription refill requests, have your pharmacy contact our office and allow 72 hours.    Due to Covid, you will need to wear a mask upon entering the hospital. If you do not have a mask, a mask will be given to you at the Main Entrance upon arrival. For doctor visits, patients may have 1 support person age 18 or older with them. For treatment visits, patients can not have anyone with them due to social distancing guidelines and our immunocompromised population.

## 2021-12-29 NOTE — Patient Instructions (Signed)
[place]  Discharge Instructions: Thank you for choosing [place] to provide your oncology and hematology care.  If you have a lab appointment with the [facility], please come in thru the Main Entrance and check in at the main information desk.  Wear comfortable clothing and clothing appropriate for easy access to any Portacath or PICC line.   We strive to give you quality time with your provider. You may need to reschedule your appointment if you arrive late (15 or more minutes).  Arriving late affects you and other patients whose appointments are after yours.  Also, if you miss three or more appointments without notifying the office, you may be dismissed from the clinic at the provider's discretion.      For prescription refill requests, have your pharmacy contact our office and allow 72 hours for refills to be completed.    Today you received the following chemotherapy and/or immunotherapy agents FOLFIRINOX, return as scheduled.   To help prevent nausea and vomiting after your treatment, we encourage you to take your nausea medication as directed.  BELOW ARE SYMPTOMS THAT SHOULD BE REPORTED IMMEDIATELY: *FEVER GREATER THAN 100.4 F (38 C) OR HIGHER *CHILLS OR SWEATING *NAUSEA AND VOMITING THAT IS NOT CONTROLLED WITH YOUR NAUSEA MEDICATION *UNUSUAL SHORTNESS OF BREATH *UNUSUAL BRUISING OR BLEEDING *URINARY PROBLEMS (pain or burning when urinating, or frequent urination) *BOWEL PROBLEMS (unusual diarrhea, constipation, pain near the anus) TENDERNESS IN MOUTH AND THROAT WITH OR WITHOUT PRESENCE OF ULCERS (sore throat, sores in mouth, or a toothache) UNUSUAL RASH, SWELLING OR PAIN  UNUSUAL VAGINAL DISCHARGE OR ITCHING   Items with * indicate a potential emergency and should be followed up as soon as possible or go to the Emergency Department if any problems should occur.  Please show the CHEMOTHERAPY ALERT CARD or IMMUNOTHERAPY ALERT CARD at  check-in to the Emergency Department and triage nurse.  Should you have questions after your visit or need to cancel or reschedule your appointment, please contact [place] [phone]  and follow the prompts.  Office hours are 8:00 a.m. to 4:30 p.m. Monday - Friday. Please note that voicemails left after 4:00 p.m. may not be returned until the following business day.  We are closed weekends and major holidays. You have access to a nurse at all times for urgent questions. Please call the main number to the clinic [phone] and follow the prompts.  For any non-urgent questions, you may also contact your provider using MyChart. We now offer e-Visits for anyone 27 and older to request care online for non-urgent symptoms. For details visit [email or web address].   Also download the MyChart app! Go to the app store, search "MyChart", open the app, select [place], and log in with your MyChart username and password.  Masks are optional in the cancer centers. If you would like for your care team to wear a mask while they are taking care of you, please let them know. You may have one support person who is at least 78 years old accompany you for your appointments.

## 2021-12-31 ENCOUNTER — Inpatient Hospital Stay: Payer: Medicare Other

## 2021-12-31 ENCOUNTER — Ambulatory Visit: Payer: Medicare Other | Admitting: Dietician

## 2021-12-31 VITALS — BP 100/63 | HR 74 | Resp 18

## 2021-12-31 DIAGNOSIS — Z5111 Encounter for antineoplastic chemotherapy: Secondary | ICD-10-CM | POA: Diagnosis not present

## 2021-12-31 DIAGNOSIS — C250 Malignant neoplasm of head of pancreas: Secondary | ICD-10-CM

## 2021-12-31 DIAGNOSIS — Z95828 Presence of other vascular implants and grafts: Secondary | ICD-10-CM

## 2021-12-31 MED ORDER — SODIUM CHLORIDE 0.9% FLUSH
10.0000 mL | INTRAVENOUS | Status: DC | PRN
  Administered 2021-12-31: 10 mL

## 2021-12-31 MED ORDER — PEGFILGRASTIM-JMDB 6 MG/0.6ML ~~LOC~~ SOSY
6.0000 mg | PREFILLED_SYRINGE | Freq: Once | SUBCUTANEOUS | Status: AC
  Administered 2021-12-31: 6 mg via SUBCUTANEOUS
  Filled 2021-12-31: qty 0.6

## 2021-12-31 MED ORDER — HEPARIN SOD (PORK) LOCK FLUSH 100 UNIT/ML IV SOLN
500.0000 [IU] | Freq: Once | INTRAVENOUS | Status: AC | PRN
  Administered 2021-12-31: 500 [IU]

## 2021-12-31 NOTE — Progress Notes (Signed)
Pt presents today for Fulphila injection and 5FU chemotherapy pump disconnection per provider's order. Vital signs stable, port flushed easily without difficulty with 10 mL of normal saline and 5 mL of heparin. Good blood return noted. Needle removed intact and no swelling or bruising noted at the site.  Discharged from clinic ambulatory in stable condition. Alert and oriented x 3. F/U with [facility] as scheduled.

## 2021-12-31 NOTE — Patient Instructions (Signed)
[place]  Discharge Instructions: Thank you for choosing [facility] to provide your oncology and hematology care.  If you have a lab appointment with the [facility], please come in thru the Main Entrance and check in at the main information desk.  Wear comfortable clothing and clothing appropriate for easy access to any Portacath or PICC line.   We strive to give you quality time with your provider. You may need to reschedule your appointment if you arrive late (15 or more minutes).  Arriving late affects you and other patients whose appointments are after yours.  Also, if you miss three or more appointments without notifying the office, you may be dismissed from the clinic at the provider's discretion.      For prescription refill requests, have your pharmacy contact our office and allow 72 hours for refills to be completed.    Today you received 5FU pump d/c and Fulphila injection     BELOW ARE SYMPTOMS THAT SHOULD BE REPORTED IMMEDIATELY: *FEVER GREATER THAN 100.4 F (38 C) OR HIGHER *CHILLS OR SWEATING *NAUSEA AND VOMITING THAT IS NOT CONTROLLED WITH YOUR NAUSEA MEDICATION *UNUSUAL SHORTNESS OF BREATH *UNUSUAL BRUISING OR BLEEDING *URINARY PROBLEMS (pain or burning when urinating, or frequent urination) *BOWEL PROBLEMS (unusual diarrhea, constipation, pain near the anus) TENDERNESS IN MOUTH AND THROAT WITH OR WITHOUT PRESENCE OF ULCERS (sore throat, sores in mouth, or a toothache) UNUSUAL RASH, SWELLING OR PAIN  UNUSUAL VAGINAL DISCHARGE OR ITCHING   Items with * indicate a potential emergency and should be followed up as soon as possible or go to the Emergency Department if any problems should occur.  Please show the CHEMOTHERAPY ALERT CARD or IMMUNOTHERAPY ALERT CARD at check-in to the Emergency Department and triage nurse.  Should you have questions after your visit or need to cancel or reschedule your appointment, please contact  [place] [phone]  and follow the prompts.  Office hours are 8:00 a.m. to 4:30 p.m. Monday - Friday. Please note that voicemails left after 4:00 p.m. may not be returned until the following business day.  We are closed weekends and major holidays. You have access to a nurse at all times for urgent questions. Please call the main number to the clinic [phone] and follow the prompts.  For any non-urgent questions, you may also contact your provider using MyChart. We now offer e-Visits for anyone 8 and older to request care online for non-urgent symptoms. For details visit [email or web address].   Also download the MyChart app! Go to the app store, search "MyChart", open the app, select [place], and log in with your MyChart username and password.  Masks are optional in the cancer centers. If you would like for your care team to wear a mask while they are taking care of you, please let them know. You may have one support person who is at least 78 years old accompany you for your appointments.

## 2021-12-31 NOTE — Progress Notes (Signed)
Nutrition Follow-up:  Patient with stage I pancreatic cancer. She is receiving neoadjuvant Folfirnox q14d.  Met with patient during pump disconnect. She reports slowly increasing fat in her diet. Patient is fearful of recurrent acute pancreatitis that would result surgical delay. She is eating small frequent meals and snacks with good sources of lean proteins. Patient continues drinking 2 Boost HP. She denies nausea, vomiting, constipation. Patient endorses 3 small loose bowel movements today. She has taken an imodium.    Medications: reviewed   Labs: 12/5 - glucose 101, BUN 26  Anthropometrics: Last wt 121 lb 6.4 oz on 12/5 increased   11/21 - 115 lb 11.2 oz 10/31 - 116 lb 1.6 oz  NUTRITION DIAGNOSIS: Unintentional weight loss improved    INTERVENTION:  Reinforced including good sources of protein with meals and snacks  Continue drinking 2 Boost Plus HP/equivalent Discussed hydration -encouraged one cup fluid for each watery bowel movement     MONITORING, EVALUATION, GOAL: weight trends, intake   NEXT VISIT: To be scheduled - planning for Whipple 02/01/22

## 2022-01-12 ENCOUNTER — Ambulatory Visit: Payer: Medicare Other | Admitting: Hematology

## 2022-01-12 ENCOUNTER — Inpatient Hospital Stay (HOSPITAL_BASED_OUTPATIENT_CLINIC_OR_DEPARTMENT_OTHER): Payer: Medicare Other | Admitting: Hematology

## 2022-01-12 ENCOUNTER — Inpatient Hospital Stay: Payer: Medicare Other

## 2022-01-12 ENCOUNTER — Encounter: Payer: Self-pay | Admitting: Hematology

## 2022-01-12 ENCOUNTER — Other Ambulatory Visit: Payer: Medicare Other

## 2022-01-12 ENCOUNTER — Ambulatory Visit: Payer: Medicare Other

## 2022-01-12 VITALS — Wt 122.1 lb

## 2022-01-12 VITALS — BP 123/73 | HR 85 | Temp 98.9°F | Resp 18

## 2022-01-12 DIAGNOSIS — C259 Malignant neoplasm of pancreas, unspecified: Secondary | ICD-10-CM

## 2022-01-12 DIAGNOSIS — C250 Malignant neoplasm of head of pancreas: Secondary | ICD-10-CM

## 2022-01-12 DIAGNOSIS — Z95828 Presence of other vascular implants and grafts: Secondary | ICD-10-CM

## 2022-01-12 DIAGNOSIS — Z5111 Encounter for antineoplastic chemotherapy: Secondary | ICD-10-CM | POA: Diagnosis not present

## 2022-01-12 LAB — CBC WITH DIFFERENTIAL/PLATELET
Abs Immature Granulocytes: 0.08 10*3/uL — ABNORMAL HIGH (ref 0.00–0.07)
Basophils Absolute: 0 10*3/uL (ref 0.0–0.1)
Basophils Relative: 0 %
Eosinophils Absolute: 0.1 10*3/uL (ref 0.0–0.5)
Eosinophils Relative: 1 %
HCT: 34.9 % — ABNORMAL LOW (ref 36.0–46.0)
Hemoglobin: 11.2 g/dL — ABNORMAL LOW (ref 12.0–15.0)
Immature Granulocytes: 1 %
Lymphocytes Relative: 17 %
Lymphs Abs: 1.2 10*3/uL (ref 0.7–4.0)
MCH: 33.6 pg (ref 26.0–34.0)
MCHC: 32.1 g/dL (ref 30.0–36.0)
MCV: 104.8 fL — ABNORMAL HIGH (ref 80.0–100.0)
Monocytes Absolute: 0.6 10*3/uL (ref 0.1–1.0)
Monocytes Relative: 8 %
Neutro Abs: 5.3 10*3/uL (ref 1.7–7.7)
Neutrophils Relative %: 73 %
Platelets: 133 10*3/uL — ABNORMAL LOW (ref 150–400)
RBC: 3.33 MIL/uL — ABNORMAL LOW (ref 3.87–5.11)
RDW: 15.9 % — ABNORMAL HIGH (ref 11.5–15.5)
WBC: 7.3 10*3/uL (ref 4.0–10.5)
nRBC: 0 % (ref 0.0–0.2)

## 2022-01-12 LAB — COMPREHENSIVE METABOLIC PANEL
ALT: 28 U/L (ref 0–44)
AST: 28 U/L (ref 15–41)
Albumin: 3.5 g/dL (ref 3.5–5.0)
Alkaline Phosphatase: 145 U/L — ABNORMAL HIGH (ref 38–126)
Anion gap: 6 (ref 5–15)
BUN: 30 mg/dL — ABNORMAL HIGH (ref 8–23)
CO2: 25 mmol/L (ref 22–32)
Calcium: 8.7 mg/dL — ABNORMAL LOW (ref 8.9–10.3)
Chloride: 109 mmol/L (ref 98–111)
Creatinine, Ser: 0.78 mg/dL (ref 0.44–1.00)
GFR, Estimated: >60 mL/min (ref >60)
Glucose, Bld: 124 mg/dL — ABNORMAL HIGH (ref 70–99)
Potassium: 3.7 mmol/L (ref 3.5–5.1)
Sodium: 140 mmol/L (ref 135–145)
Total Bilirubin: 0.4 mg/dL (ref 0.3–1.2)
Total Protein: 6.5 g/dL (ref 6.5–8.1)

## 2022-01-12 LAB — MAGNESIUM: Magnesium: 2.1 mg/dL (ref 1.7–2.4)

## 2022-01-12 MED ORDER — SODIUM CHLORIDE 0.9 % IV SOLN
150.0000 mg | Freq: Once | INTRAVENOUS | Status: AC
  Administered 2022-01-12: 150 mg via INTRAVENOUS
  Filled 2022-01-12: qty 5

## 2022-01-12 MED ORDER — OXALIPLATIN CHEMO INJECTION 100 MG/20ML
65.0000 mg/m2 | Freq: Once | INTRAVENOUS | Status: AC
  Administered 2022-01-12: 100 mg via INTRAVENOUS
  Filled 2022-01-12: qty 20

## 2022-01-12 MED ORDER — SODIUM CHLORIDE 0.9 % IV SOLN
320.0000 mg/m2 | Freq: Once | INTRAVENOUS | Status: AC
  Administered 2022-01-12: 492 mg via INTRAVENOUS
  Filled 2022-01-12: qty 24.6

## 2022-01-12 MED ORDER — DEXTROSE 5 % IV SOLN: Freq: Once | INTRAVENOUS | Status: AC

## 2022-01-12 MED ORDER — PALONOSETRON HCL INJECTION 0.25 MG/5ML
0.2500 mg | Freq: Once | INTRAVENOUS | Status: AC
  Administered 2022-01-12: 0.25 mg via INTRAVENOUS
  Filled 2022-01-12: qty 5

## 2022-01-12 MED ORDER — ATROPINE SULFATE 1 MG/ML IV SOLN
0.5000 mg | Freq: Once | INTRAVENOUS | Status: AC
  Administered 2022-01-12: 0.5 mg via INTRAVENOUS

## 2022-01-12 MED ORDER — SODIUM CHLORIDE 0.9 % IV SOLN
120.0000 mg/m2 | Freq: Once | INTRAVENOUS | Status: AC
  Administered 2022-01-12: 180 mg via INTRAVENOUS
  Filled 2022-01-12: qty 5

## 2022-01-12 MED ORDER — SODIUM CHLORIDE 0.9% FLUSH: 10.0000 mL | Freq: Once | INTRAVENOUS | Status: DC

## 2022-01-12 MED ORDER — SODIUM CHLORIDE 0.9 % IV SOLN
1920.0000 mg/m2 | INTRAVENOUS | Status: DC
  Administered 2022-01-12: 2950 mg via INTRAVENOUS
  Filled 2022-01-12: qty 59

## 2022-01-12 MED ORDER — SODIUM CHLORIDE 0.9 % IV SOLN: INTRAVENOUS | Status: DC

## 2022-01-12 MED ORDER — SODIUM CHLORIDE 0.9 % IV SOLN
10.0000 mg | Freq: Once | INTRAVENOUS | Status: AC
  Administered 2022-01-12: 10 mg via INTRAVENOUS
  Filled 2022-01-12: qty 10

## 2022-01-12 NOTE — Progress Notes (Signed)
Patients port flushed without difficulty.  Good blood return noted with no bruising or swelling noted at site.  Stable during access and blood draw.  Patient to remain accessed for treatment. 

## 2022-01-12 NOTE — Progress Notes (Signed)
Sheryl Bender, [place] [address]   CLINIC:  Medical Oncology/Hematology  PCP:  Sherrilee Gilles, DO 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 VUS in ATM called c.4367G>A and in [facility] called c.238G>A also identified. The remainder of testing was negative/normal. The report date is 09/20/2021.  The CancerNext-Expanded + RNAinsight gene panel offered by [employer] and includes sequencing and rearrangement analysis for the following 77 genes: IP, ALK, APC*, ATM*, AXIN2, BAP1, BARD1, BLM, BMPR1A, BRCA1*, BRCA2*, BRIP1*, CDC73, CDH1*,CDK4, CDKN1B, CDKN2A, CHEK2*, CTNNA1, DICER1, FANCC, FH, FLCN, GALNT12, KIF1B, LZTR1, MAX, MEN1, MET, MLH1*, MSH2*, MSH3, MSH6*, MUTYH*, NBN, NF1*, NF2, NTHL1, PALB2*, PHOX2B, PMS2*, POT1, PRKAR1A, PTCH1, PTEN*, RAD51C*, RAD51D*,RB1, RECQL, RET, SDHA, SDHAF2, SDHB, SDHC, SDHD, SMAD4, SMARCA4, SMARCB1, SMARCE1, STK11, SUFU, TMEM127, TP53*,TSC1, TSC2, VHL and XRCC2 (sequencing and deletion/duplication); EGFR, EGLN1, HOXB13, KIT, MITF, PDGFRA, POLD1 and POLE (sequencing only); EPCAM and GREM1 (deletion/duplication only).     CANCER STAGING:  Cancer Staging  Malignant neoplasm of pancreas [facility]) Staging form: Exocrine Pancreas, AJCC 8th Edition - Clinical stage from 07/29/2021: Stage IA  (cT1, cN0, cM0) - Unsigned   INTERVAL HISTORY:  [facility] Bender, a 76 y.o. female, seen for follow-up of pancreatic cancer.  She has tolerated last cycle of chemotherapy 2 weeks ago reasonably well.  She had cold sensitivity lasting all 2 weeks.  She also reported some worsening of memory since the start of chemotherapy.  REVIEW OF SYSTEMS:  Review of Systems  Constitutional:  Negative for appetite change and fatigue.  Respiratory:  Positive for shortness of breath.   Cardiovascular:  Positive for chest pain.  Gastrointestinal:  Positive for constipation and diarrhea.  Neurological:  Positive for headaches.  Psychiatric/Behavioral:  Positive for depression and sleep disturbance. The patient is nervous/anxious.   All other systems reviewed and are negative.   PAST MEDICAL/SURGICAL HISTORY:  Past Medical History:  Diagnosis Date   Anxiety    Arthritis    Depression    Dysrhythmia    hx palpitations greater than 5 yrs -neg echo, stress per pt ? where or dr   GERD (gastroesophageal reflux disease)    occ   Pancreatic adenocarcinoma ([place])    Pancreatic pseudocyst    Port-A-Cath in place 09/15/2021   Pulmonary embolism [facility])    September 2022   Past Surgical History:  Procedure Laterality Date   ANTERIOR LAT LUMBAR FUSION Right 12/16/2015   Procedure: RIGHT LUMBAR TWO-THREE, LUMBAR THREE-FOUR, LUMBAR FOUR-FIVE ANTEROLATERAL LUMBAR INTERBODY FUSION;  Surgeon: Erline Levine, MD;  Location: [facility];  Service: Neurosurgery;  Laterality: Right;   BALLOON DILATION N/A 08/27/2021   Procedure: BALLOON DILATION;  Surgeon: Rush Landmark Telford Nab., MD;  Location[facility];  Service: Gastroenterology;  Laterality: N/A;   BIOPSY  07/09/2021   Procedure: BIOPSY;  Surgeon: Irving Copas.,  MD;  Location: [facility];  Service: Gastroenterology;;   CYST GASTROSTOMY  08/27/2021   Procedure: CYST GASTROSTOMY;  Surgeon: Irving Copas., MD;  [facility]: [facility];  Service:  Gastroenterology;;   ESOPHAGOGASTRODUODENOSCOPY N/A 07/09/2021   Procedure: ESOPHAGOGASTRODUODENOSCOPY (EGD);  Surgeon: Irving Copas., MD;  Location: [facility];  Service: Gastroenterology;  Laterality: N/A;   ESOPHAGOGASTRODUODENOSCOPY (EGD) WITH PROPOFOL N/A 08/27/2021   Procedure: ESOPHAGOGASTRODUODENOSCOPY (EGD) WITH PROPOFOL;  Surgeon: Rush Landmark Telford Nab., MD;  [facility];  Service: Gastroenterology;  Laterality: N/A;   ESOPHAGOGASTRODUODENOSCOPY (EGD) WITH PROPOFOL N/A 10/08/2021   Procedure: ESOPHAGOGASTRODUODENOSCOPY (EGD) WITH PROPOFOL;  Surgeon: Rush Landmark Telford Nab., MD;  [facility];  Service: Gastroenterology;  Laterality: N/A;   EUS N/A 07/09/2021   Procedure: UPPER ENDOSCOPIC ULTRASOUND (EUS) RADIAL;  Surgeon: Irving Copas., MD;  Location: [facility];  Service: Gastroenterology;  Laterality: N/A;   EUS N/A 08/27/2021   Procedure: UPPER ENDOSCOPIC ULTRASOUND (EUS) LINEAR;  Surgeon: Irving Copas., MD;  [facility];  Service: Gastroenterology;  Laterality: N/A;   FINE NEEDLE ASPIRATION  07/09/2021   Procedure: FINE NEEDLE ASPIRATION (FNA) LINEAR;  Surgeon: Irving Copas., MD;  Location: [facility];  Service: Gastroenterology;;   IR IMAGING GUIDED PORT INSERTION  09/14/2021   LUMBAR PERCUTANEOUS PEDICLE SCREW 3 LEVEL Bilateral 12/16/2015   Procedure: PERCUTANEOUS PEDICLE SCREWS BILATERALLY AT LUMBAR TWO-FIVE;  Surgeon: Erline Levine, MD;  Location: [facility];  Service: Neurosurgery;  Laterality: Bilateral;   PANCREATIC STENT PLACEMENT  08/27/2021   Procedure: PANCREATIC STENT PLACEMENT;  Surgeon: Rush Landmark Telford Nab., MD;  Location[facility];  Service: Gastroenterology;;   Lavell Islam REMOVAL  10/08/2021   Procedure: STENT REMOVAL;  Surgeon: Irving Copas., MD;  Location: [facility];  Service: Gastroenterology;;   TUBAL LIGATION  1974    SOCIAL HISTORY:  Social History   Socioeconomic History    Marital status: Widowed    Spouse name: Not on file   Number of children: Not on file   Years of education: Not on file   Highest education level: Not on file  Occupational History   Not on file  Tobacco Use   Smoking status: Never   Smokeless tobacco: Never  Substance and Sexual Activity   Alcohol use: No   Drug use: Never   Sexual activity: Not on file  Other Topics Concern   Not on file  Social History Narrative   Not on file   Social Determinants of Health   Financial Resource Strain: Not on file  Food Insecurity: Not on file  Transportation Needs: Not on file  Physical Activity: Not on file  Stress: Not on file  Social Connections: Not on file  Intimate Partner Violence: Not on file    FAMILY HISTORY:  Family History  Problem Relation Age of Onset   Pancreatitis Brother        alcoholic pancreatitis   Pancreatic cancer Neg Hx    Colon cancer Neg Hx     CURRENT MEDICATIONS:  Current Outpatient Medications  Medication Sig Dispense Refill   acetaminophen (TYLENOL) 500 MG tablet Take 2 tablets (1,000 mg total) by mouth every 8 (eight) hours as needed for moderate pain, headache or fever. (Patient taking differently: Take 250-1,000 mg by mouth every 8 (eight) hours as needed for moderate pain, headache or fever.)     alum & mag hydroxide-simeth (MAALOX/MYLANTA) 200-200-20 MG/5ML suspension Take 30 mLs by mouth every 4 (four) hours as needed for indigestion. 355 mL 0  Aromatic Inhalants (VICKS VAPOINHALER) INHA Inhale 1 puff into the lungs daily as needed (congestion).     ascorbic acid (VITAMIN C) 500 MG tablet Take 500 mg by mouth in the morning.     buPROPion (WELLBUTRIN XL) 300 MG 24 hr tablet Take 300 mg by mouth daily after breakfast.     busPIRone (BUSPAR) 10 MG tablet Take 20 mg by mouth in the morning and at bedtime.     calcium carbonate (TUMS - DOSED IN MG ELEMENTAL CALCIUM) 500 MG chewable tablet Chew 1 tablet (200 mg of elemental calcium total) by mouth 2  (two) times daily as needed for indigestion or heartburn. (Patient taking differently: Chew 1-2 tablets by mouth 2 (two) times daily as needed for indigestion or heartburn.)     Cholecalciferol (VITAMIN D3) 125 MCG (5000 UT) CAPS Take 5,000 Units by mouth every other day.     ciclopirox (LOPROX) 0.77 % cream Apply 1 application  topically at bedtime. Apply to the fingernails     escitalopram (LEXAPRO) 20 MG tablet Take 20 mg by mouth at bedtime.     fluorouracil CALGB 33354 1,920 mg/m2 in sodium chloride 0.9 % 150 mL Inject 1,920 mg/m2 into the vein over 48 hr.     lactose free nutrition (BOOST) LIQD Take 237 mLs by mouth 2 (two) times daily between meals.     lactulose (CHRONULAC) 10 GM/15ML solution Take 15 mLs (10 g total) by mouth at bedtime. Take 15 ml at bedtime every night to assist with bowel movements. If bowel movement has not occurred in 3-4 days take 15 ml every 3 hours until bowel movement has occurred. 236 mL 0   LEUCOVORIN CALCIUM IV Inject into the vein every 14 (fourteen) days.     lidocaine-prilocaine (EMLA) cream Apply a small amount to port a cath site (do not rub in) and cover with plastic wrap 1 hour prior to infusion appointments 30 g 3   linaclotide (LINZESS) 72 MCG capsule Take 1 capsule (72 mcg total) by mouth daily before breakfast. 30 capsule 12   metoprolol succinate (TOPROL-XL) 25 MG 24 hr tablet Take 25 mg by mouth daily after breakfast.     mirtazapine (REMERON) 30 MG tablet Take 30 mg by mouth at bedtime.     omeprazole (PRILOSEC) 40 MG capsule Take 1 capsule (40 mg total) by mouth daily. 30 capsule 12   ondansetron (ZOFRAN-ODT) 8 MG disintegrating tablet Take 1 tablet (8 mg total) by mouth every 8 (eight) hours as needed for nausea or vomiting. 30 tablet 0   OXALIPLATIN IV Inject into the vein every 14 (fourteen) days.     oxyCODONE (OXY IR/ROXICODONE) 5 MG immediate release tablet Take 1 tablet (5 mg total) by mouth every 6 (six) hours as needed for severe pain. 30  tablet 0   polyethylene glycol powder (MIRALAX) 17 GM/SCOOP powder Take 17 g by mouth daily. Hold for diarrhea/loose stools     prochlorperazine (COMPAZINE) 10 MG tablet Take 1 tablet (10 mg total) by mouth every 6 (six) hours as needed for nausea or vomiting (Nausea or vomiting). 60 tablet 3   QUEtiapine (SEROQUEL) 200 MG tablet Take 200 mg by mouth at bedtime.     QUEtiapine (SEROQUEL) 25 MG tablet Take 25 mg by mouth 3 (three) times daily after meals. (In the morning, 1300 & 1800)     sodium chloride (OCEAN) 0.65 % SOLN nasal spray Place 1 spray into both nostrils as needed for congestion.  vitamin B-12 (CYANOCOBALAMIN) 500 MCG tablet Take 1 tablet (500 mcg total) by mouth daily. 30 tablet 2   Zinc 25 MG TABS Take 25 mg by mouth daily.     No current facility-administered medications for this visit.   Facility-Administered Medications Ordered in Other Visits  Medication Dose Route Frequency Provider Last Rate Last Admin   0.9 %  sodium chloride infusion   Intravenous Continuous [name], MD   Stopped at 01/12/22 1539   fluorouracil (ADRUCIL) 2,950 mg in sodium chloride 0.9 % 91 mL chemo infusion  1,920 mg/m2 (Treatment Plan Recorded) Intravenous 1 day or 1 dose [name], MD   Infusion Verify at 01/12/22 1555    ALLERGIES:  Allergies  Allergen Reactions   Other Hives and Other (See Comments)    Cherry wood just cut- "smelled it and broke out"; cannot tolerate ANY cherry fragrances, either      Cherry Hives   Wound Dressing Adhesive Rash and Other (See Comments)    Band-Aids = local reaction    PHYSICAL EXAM:  Performance status (ECOG): 1 - Symptomatic but completely ambulatory  There were no vitals filed for this visit.   Wt Readings from Last 3 Encounters:  01/12/22 122 lb 2.2 oz (55.4 kg)  12/29/21 121 lb 6.4 oz (55.1 kg)  12/15/21 115 lb 11.2 oz (52.5 kg)   Physical Exam Vitals reviewed.  Constitutional:      Appearance: Normal appearance.   Cardiovascular:     Rate and Rhythm: Normal rate and regular rhythm.     Pulses: Normal pulses.     Heart sounds: Normal heart sounds.  Pulmonary:     Effort: Pulmonary effort is normal.     Breath sounds: Normal breath sounds.  Neurological:     General: No focal deficit present.     Mental Status: She is alert and oriented to person, place, and time.  Psychiatric:        Mood and Affect: Mood normal.        Behavior: Behavior normal.      LABORATORY DATA:  I have reviewed the labs as listed.     Latest Ref Rng & Units 01/12/2022    7:52 AM 12/29/2021    8:00 AM 11/24/2021    8:32 AM  CBC  WBC 4.0 - 10.5 K/uL 7.3  3.6  12.5   Hemoglobin 12.0 - 15.0 g/dL 11.2  11.3  11.5   Hematocrit 36.0 - 46.0 % 34.9  35.0  35.1   Platelets 150 - 400 K/uL 133  184  201       Latest Ref Rng & Units 01/12/2022    7:52 AM 12/29/2021    8:00 AM 11/24/2021    8:32 AM  CMP  Glucose 70 - 99 mg/dL 124  101  108   BUN 8 - 23 mg/dL _0 Creatinine 0.44 - 1.00 mg/dL 0.78  0.71  0.79   Sodium 135 - 145 mmol/L 140  142  140   Potassium 3.5 - 5.1 mmol/L 3.7  4.0  3.8   Chloride 98 - 111 mmol/L 109  106  106   CO2 22 - 32 mmol/L _1 Calcium 8.9 - 10.3 mg/dL 8.7  9.2  9.1   Total Protein 6.5 - 8.1 g/dL 6.5  6.6  6.5   Total Bilirubin 0.3 - 1.2 mg/dL 0.4  0.3  0.3   Alkaline Phos 38 - 126  U/L 145  118  119   AST 15 - 41 U/L 28  30  46   ALT 0 - 44 U/L 28  32  58     DIAGNOSTIC IMAGING:  I have independently reviewed the scans and discussed with the patient.    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 - EGD/EUS (07/09/2021) by Dr. Rush Landmark: The region where the PD is strictured, darker appearance was identified.  There was no clear hypoechoic mass.  Endosonographic appearance suggestive of either chronic pancreatitis changes versus possible  malignancy.  Endosonographic staging T1 N0 MX.  Pancreatic parenchymal abnormalities consisting of atrophy and hyperechoic strands.  No sign of significant pathology in the CBD.  No malignant appearing lymph nodes in the celiac region, peripancreatic region and porta hepatic region - Pathology (pancreatic duct FNA): Malignant cells consistent with adenocarcinoma - CA 19-9: 31 (0-35) - CTAP (07/13/2021): Fluid collection extending posterior to the gastric antrum and ventral to the pancreas.  Elongated collection extends to the greater curvature of the stomach with inflammation surrounding the collection suggestive of a pancreatic pseudocyst.  Chronic dilatation of pancreatic duct with abrupt termination of the duct dilatation in the head of the pancreas. - CT chest (07/16/2021): No evidence of metastatic disease in the chest. - 8 to 9 pound weight loss in the last 3 months, decreased appetite.       - PET scan (07/30/2021): Faint focus of increased metabolic activity, SUV 3.6, measuring 9 mm at the junction of the pancreatic head and body, suspicious for pancreatic adenocarcinoma.  No obvious extrapancreatic spread of tumor.  Cystic lesion along the posterior margin of the stomach and anterior margin of the pancreatic head favored pseudocyst.  Small amount of pelvic ascites. - MRI of the abdomen pancreatic protocol (08/03/2021): Similar appearance of the pancreas, with abrupt transition from dilated to decompressed duct in the pancreatic head with no evidence of underlying mass.  Pseudocyst enlarged position between pancreatic head and GE junction with significant mass effect upon the pyloric region and gastric outlet obstruction radiologically.  No evidence of abdominal metastatic disease. - FOLFOX on 09/16/2021, FOLFIRINOX cycle 2 on 09/30/2021    Social/family history: - She is accompanied by her son [name] today.  She lives at home by herself.  She is independent of ADLs and IADLs.  She did office work prior  to retirement.  Non-smoker and nonalcoholic. - Brother (alcoholic) and maternal half uncle had pancreatitis.  No history of malignancy.  3. Pulmonary embolism: - She was diagnosed with pulmonary embolism in January 2023 and then will hospital.  There does not appear to be any provoking factors for PE.  Reportedly Doppler was negative for DVT.  She was treated with Eliquis for 60 days, discontinued after that.   PLAN:  Stage I (T1 N0 MX) pancreatic adenocarcinoma: - CTAP (12/08/2021): Unchanged appearance of the pancreas.  No evidence of lymphadenopathy or metastatic disease.  Previously seen large pseudocyst anterior to the Peri pancreatic head is resolved. - She has tolerated cycle 7 on 12/28/2021 very well. - We reviewed labs today which showed alk phos elevated at 145 and normal LFTs.  CBC was grossly normal. - Recommend proceeding with cycle 8 today.  This will be her last cycle prior to her surgery on 02/15/2021. - RTC 4 weeks for follow-up with CT AP pancreatic protocol with and without contrast.  We  will also send a CA 19-9 level today.  2.  Weight loss: - She is eating reasonably well and maintaining her weight.    Orders placed this encounter:  Orders Placed This Encounter  Procedures   CT Abdomen Pelvis W Wo Contrast   Magnesium   CBC with Differential   Comprehensive metabolic panel     [name], MD [facility] [phone]

## 2022-01-12 NOTE — Patient Instructions (Signed)
[facility]  Discharge Instructions: Thank you for choosing [facility] to provide your oncology and hematology care.  If you have a lab appointment with the [facility], please come in thru the Main Entrance and check in at the main information desk.  Wear comfortable clothing and clothing appropriate for easy access to any Portacath or PICC line.   We strive to give you quality time with your provider. You may need to reschedule your appointment if you arrive late (15 or more minutes).  Arriving late affects you and other patients whose appointments are after yours.  Also, if you miss three or more appointments without notifying the office, you may be dismissed from the clinic at the provider's discretion.      For prescription refill requests, have your pharmacy contact our office and allow 72 hours for refills to be completed.    Today you received the following chemotherapy and/or immunotherapy agents Folfirinox    To help prevent nausea and vomiting after your treatment, we encourage you to take your nausea medication as directed.  BELOW ARE SYMPTOMS THAT SHOULD BE REPORTED IMMEDIATELY: *FEVER GREATER THAN 100.4 F (38 C) OR HIGHER *CHILLS OR SWEATING *NAUSEA AND VOMITING THAT IS NOT CONTROLLED WITH YOUR NAUSEA MEDICATION *UNUSUAL SHORTNESS OF BREATH *UNUSUAL BRUISING OR BLEEDING *URINARY PROBLEMS (pain or burning when urinating, or frequent urination) *BOWEL PROBLEMS (unusual diarrhea, constipation, pain near the anus) TENDERNESS IN MOUTH AND THROAT WITH OR WITHOUT PRESENCE OF ULCERS (sore throat, sores in mouth, or a toothache) UNUSUAL RASH, SWELLING OR PAIN  UNUSUAL VAGINAL DISCHARGE OR ITCHING   Items with * indicate a potential emergency and should be followed up as soon as possible or go to the Emergency Department if any problems should occur.  Please show the CHEMOTHERAPY ALERT CARD or IMMUNOTHERAPY ALERT CARD at check-in to the  Emergency Department and triage nurse.  Should you have questions after your visit or need to cancel or reschedule your appointment, please contact [facility] [phone]  and follow the prompts.  Office hours are 8:00 a.m. to 4:30 p.m. Monday - Friday. Please note that voicemails left after 4:00 p.m. may not be returned until the following business day.  We are closed weekends and major holidays. You have access to a nurse at all times for urgent questions. Please call the main number to the clinic [phone] and follow the prompts.  For any non-urgent questions, you may also contact your provider using MyChart. We now offer e-Visits for anyone 82 and older to request care online for non-urgent symptoms. For details visit [email or web address].   Also download the MyChart app! Go to the app store, search "MyChart", open the app, select [facility], and log in with your MyChart username and password.  Masks are optional in the cancer centers. If you would like for your care team to wear a mask while they are taking care of you, please let them know. You may have one support person who is at least 78 years old accompany you for your appointments.

## 2022-01-12 NOTE — Progress Notes (Signed)
Patient has been assessed, vital signs and labs have been reviewed by Dr. Katragadda. ANC, Creatinine, LFTs, and Platelets are within treatment parameters per Dr. Katragadda. The patient is good to proceed with treatment at this time. Primary RN and pharmacy aware.  

## 2022-01-12 NOTE — Progress Notes (Signed)
Pt presents today for Folfirinox per provider's order. Vital signs and labs WNL for treatment. Okay to proceed with treatment today per Dr.K.  Folfirinox given today per MD orders. Tolerated infusion without adverse affects. Vital signs stable. No complaints at this time. Discharged from clinic via wheelchair in stable condition. Alert and oriented x 3. F/U with [facility] as scheduled.  5FU ambulatory pump infusing.

## 2022-01-12 NOTE — Patient Instructions (Addendum)
[facility]  Discharge Instructions  You were seen and examined today by Dr. Delton Coombes.  Your labs are stable. Please proceed with your treatment as originally planned. This is the last treatment prior to surgery.  You will have a CT scan in 4 weeks.  Follow-up as scheduled.  Thank you for choosing [facility] to provide your oncology and hematology care.   To afford each patient quality time with our provider, please arrive at least 15 minutes before your scheduled appointment time. You may need to reschedule your appointment if you arrive late (10 or more minutes). Arriving late affects you and other patients whose appointments are after yours.  Also, if you miss three or more appointments without notifying the office, you may be dismissed from the clinic at the provider's discretion.    Again, thank you for choosing [facility].  Our hope is that these requests will decrease the amount of time that you wait before being seen by our physicians.   If you have a lab appointment with the [facility] please come in thru the Main Entrance and check in at the main information desk.           _____________________________________________________________  Should you have questions after your visit to [facility], please contact our office at [phone] and follow the prompts.  Our office hours are 8:00 a.m. to 4:30 p.m. Monday - Thursday and 8:00 a.m. to 2:30 p.m. Friday.  Please note that voicemails left after 4:00 p.m. may not be returned until the following business day.  We are closed weekends and all major holidays.  You do have access to a nurse 24-7, just call the main number to the clinic [phone] and do not press any options, hold on the line and a nurse will answer the phone.    For prescription refill requests, have your pharmacy contact our office and allow 72 hours.    Masks are optional in  the cancer centers. If you would like for your care team to wear a mask while they are taking care of you, please let them know. You may have one support person who is at least 78 years old accompany you for your appointments.

## 2022-01-14 ENCOUNTER — Inpatient Hospital Stay: Payer: Medicare Other

## 2022-01-14 VITALS — BP 100/54 | HR 83 | Temp 99.5°F | Resp 18

## 2022-01-14 DIAGNOSIS — Z95828 Presence of other vascular implants and grafts: Secondary | ICD-10-CM

## 2022-01-14 DIAGNOSIS — C250 Malignant neoplasm of head of pancreas: Secondary | ICD-10-CM

## 2022-01-14 DIAGNOSIS — Z5111 Encounter for antineoplastic chemotherapy: Secondary | ICD-10-CM | POA: Diagnosis not present

## 2022-01-14 LAB — CANCER ANTIGEN 19-9: CA 19-9: 10 U/mL (ref 0–35)

## 2022-01-14 MED ORDER — HEPARIN SOD (PORK) LOCK FLUSH 100 UNIT/ML IV SOLN
500.0000 [IU] | Freq: Once | INTRAVENOUS | Status: AC | PRN
  Administered 2022-01-14: 500 [IU]

## 2022-01-14 MED ORDER — SODIUM CHLORIDE 0.9% FLUSH
10.0000 mL | INTRAVENOUS | Status: DC | PRN
  Administered 2022-01-14: 10 mL

## 2022-01-14 MED ORDER — PEGFILGRASTIM-JMDB 6 MG/0.6ML ~~LOC~~ SOSY
6.0000 mg | PREFILLED_SYRINGE | Freq: Once | SUBCUTANEOUS | Status: AC
  Administered 2022-01-14: 6 mg via SUBCUTANEOUS
  Filled 2022-01-14: qty 0.6

## 2022-01-14 NOTE — Progress Notes (Signed)
Patient presents today for 5FU pump stop and disconnection after 46 hour continous infusion.   5FU pump deaccessed.  Patients port flushed without difficulty.  Good blood return noted with no bruising or swelling noted at site.  Needle removed intact.  Band aid applied. Fulphila administration without incident; injection site WNL; see MAR for injection details.  Patient tolerated procedure well and without incident.  No questions or complaints noted at this time. VSS with discharge and left in satisfactory condition via wheelchair with no s/s of distress noted.

## 2022-01-14 NOTE — Patient Instructions (Signed)
[facility]  Discharge Instructions: Thank you for choosing [facility] to provide your oncology and hematology care.  If you have a lab appointment with the [facility], please come in thru the Main Entrance and check in at the main information desk.  Wear comfortable clothing and clothing appropriate for easy access to any Portacath or PICC line.   We strive to give you quality time with your provider. You may need to reschedule your appointment if you arrive late (15 or more minutes).  Arriving late affects you and other patients whose appointments are after yours.  Also, if you miss three or more appointments without notifying the office, you may be dismissed from the clinic at the provider's discretion.      For prescription refill requests, have your pharmacy contact our office and allow 72 hours for refills to be completed.    Today you received the following chemotherapy and/or immunotherapy agents fulphila      To help prevent nausea and vomiting after your treatment, we encourage you to take your nausea medication as directed.  BELOW ARE SYMPTOMS THAT SHOULD BE REPORTED IMMEDIATELY: *FEVER GREATER THAN 100.4 F (38 C) OR HIGHER *CHILLS OR SWEATING *NAUSEA AND VOMITING THAT IS NOT CONTROLLED WITH YOUR NAUSEA MEDICATION *UNUSUAL SHORTNESS OF BREATH *UNUSUAL BRUISING OR BLEEDING *URINARY PROBLEMS (pain or burning when urinating, or frequent urination) *BOWEL PROBLEMS (unusual diarrhea, constipation, pain near the anus) TENDERNESS IN MOUTH AND THROAT WITH OR WITHOUT PRESENCE OF ULCERS (sore throat, sores in mouth, or a toothache) UNUSUAL RASH, SWELLING OR PAIN  UNUSUAL VAGINAL DISCHARGE OR ITCHING   Items with * indicate a potential emergency and should be followed up as soon as possible or go to the Emergency Department if any problems should occur.  Please show the CHEMOTHERAPY ALERT CARD or IMMUNOTHERAPY ALERT CARD at check-in to the  Emergency Department and triage nurse.  Should you have questions after your visit or need to cancel or reschedule your appointment, please contact [facility] [phone]  and follow the prompts.  Office hours are 8:00 a.m. to 4:30 p.m. Monday - Friday. Please note that voicemails left after 4:00 p.m. may not be returned until the following business day.  We are closed weekends and major holidays. You have access to a nurse at all times for urgent questions. Please call the main number to the clinic [phone] and follow the prompts.  For any non-urgent questions, you may also contact your provider using MyChart. We now offer e-Visits for anyone 46 and older to request care online for non-urgent symptoms. For details visit [email or web address].   Also download the MyChart app! Go to the app store, search "MyChart", open the app, select [facility], and log in with your MyChart username and password.  Masks are optional in the cancer centers. If you would like for your care team to wear a mask while they are taking care of you, please let them know. You may have one support person who is at least 78 years old accompany you for your appointments.

## 2022-01-22 ENCOUNTER — Encounter: Payer: Self-pay | Admitting: Hematology

## 2022-01-22 NOTE — Progress Notes (Signed)
[place], [place] [address]   CLINIC:  Medical Oncology/Hematology  PCP:  Sherrilee Gilles, DO [address] 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 VUS in ATM called c.4367G>A and in [facility] called c.238G>A also identified. The remainder of testing was negative/normal. The report date is 09/20/2021.  The CancerNext-Expanded + RNAinsight gene panel offered by [employer] and includes sequencing and rearrangement analysis for the following 77 genes: IP, ALK, APC*, ATM*, AXIN2, BAP1, BARD1, BLM, BMPR1A, BRCA1*, BRCA2*, BRIP1*, CDC73, CDH1*,CDK4, CDKN1B, CDKN2A, CHEK2*, CTNNA1, DICER1, FANCC, FH, FLCN, GALNT12, KIF1B, LZTR1, MAX, MEN1, MET, MLH1*, MSH2*, MSH3, MSH6*, MUTYH*, NBN, NF1*, NF2, NTHL1, PALB2*, PHOX2B, PMS2*, POT1, PRKAR1A, PTCH1, PTEN*, RAD51C*, RAD51D*,RB1, RECQL, RET, SDHA, SDHAF2, SDHB, SDHC, SDHD, SMAD4, SMARCA4, SMARCB1, SMARCE1, STK11, SUFU, TMEM127, TP53*,TSC1, TSC2, VHL and XRCC2 (sequencing and deletion/duplication); EGFR, EGLN1, HOXB13, KIT, MITF, PDGFRA, POLD1 and POLE (sequencing only); EPCAM and GREM1 (deletion/duplication only).     CANCER STAGING:  Cancer Staging  Malignant neoplasm of pancreas [facility]) Staging form: Exocrine Pancreas, AJCC 8th Edition - Clinical stage from 07/29/2021: Stage IA  (cT1, cN0, cM0) - Unsigned   INTERVAL HISTORY:  Ms. Kourtlyn Charlet, a 76 y.o. female, seen for follow-up of pancreatic cancer and toxicity assessment prior to cycle 7 of FOLFIRINOX chemotherapy.  She reports energy levels are 50%.  Diarrhea and constipation alternating and stable.  Dyspnea on exertion is also stable.  She has been eating well and gained 6 pounds.  REVIEW OF SYSTEMS:  Review of Systems  Constitutional:  Negative for appetite change and fatigue.  Respiratory:  Positive for shortness of breath.   Gastrointestinal:  Positive for constipation and diarrhea.  Neurological:  Positive for dizziness.  Psychiatric/Behavioral:  Positive for sleep disturbance.   All other systems reviewed and are negative.   PAST MEDICAL/SURGICAL HISTORY:  Past Medical History:  Diagnosis Date   Anxiety    Arthritis    Depression    Dysrhythmia    hx palpitations greater than 5 yrs -neg echo, stress per pt ? where or dr   GERD (gastroesophageal reflux disease)    occ   Pancreatic adenocarcinoma ([place])    Pancreatic pseudocyst    Port-A-Cath in place 09/15/2021   Pulmonary embolism [facility])    September 2022   Past Surgical History:  Procedure Laterality Date   ANTERIOR LAT LUMBAR FUSION Right 12/16/2015   Procedure: RIGHT LUMBAR TWO-THREE, LUMBAR THREE-FOUR, LUMBAR FOUR-FIVE ANTEROLATERAL LUMBAR INTERBODY FUSION;  Surgeon: Erline Levine, MD;  Location: [facility];  Service: Neurosurgery;  Laterality: Right;   BALLOON DILATION N/A 08/27/2021   Procedure: BALLOON DILATION;  Surgeon: Rush Landmark Telford Nab., MD;  Location: [facility];  Service: Gastroenterology;  Laterality: N/A;   BIOPSY  07/09/2021   Procedure: BIOPSY;  Surgeon: Rush Landmark Telford Nab., MD;  Location: [facility];  Service:  Gastroenterology;;   CYST GASTROSTOMY  08/27/2021   Procedure: CYST GASTROSTOMY;  Surgeon: Rush Landmark Telford Nab., MD;  Location: [facility];  Service: Gastroenterology;;   ESOPHAGOGASTRODUODENOSCOPY N/A  07/09/2021   Procedure: ESOPHAGOGASTRODUODENOSCOPY (EGD);  Surgeon: Irving Copas., MD;  Location: [facility];  Service: Gastroenterology;  Laterality: N/A;   ESOPHAGOGASTRODUODENOSCOPY (EGD) WITH PROPOFOL N/A 08/27/2021   Procedure: ESOPHAGOGASTRODUODENOSCOPY (EGD) WITH PROPOFOL;  Surgeon: Rush Landmark Telford Nab., MD;  Location: [facility];  Service: Gastroenterology;  Laterality: N/A;   ESOPHAGOGASTRODUODENOSCOPY (EGD) WITH PROPOFOL N/A 10/08/2021   Procedure: ESOPHAGOGASTRODUODENOSCOPY (EGD) WITH PROPOFOL;  Surgeon: Rush Landmark Telford Nab., MD;  Location: [facility];  Service: Gastroenterology;  Laterality: N/A;   EUS N/A 07/09/2021   Procedure: UPPER ENDOSCOPIC ULTRASOUND (EUS) RADIAL;  Surgeon: Irving Copas., MD;  Location: [facility];  Service: Gastroenterology;  Laterality: N/A;   EUS N/A 08/27/2021   Procedure: UPPER ENDOSCOPIC ULTRASOUND (EUS) LINEAR;  Surgeon: Irving Copas., MD;  Location: [facility];  Service: Gastroenterology;  Laterality: N/A;   FINE NEEDLE ASPIRATION  07/09/2021   Procedure: FINE NEEDLE ASPIRATION (FNA) LINEAR;  Surgeon: Irving Copas., MD;  Location: [facility];  Service: Gastroenterology;;   IR IMAGING GUIDED PORT INSERTION  09/14/2021   LUMBAR PERCUTANEOUS PEDICLE SCREW 3 LEVEL Bilateral 12/16/2015   Procedure: PERCUTANEOUS PEDICLE SCREWS BILATERALLY AT LUMBAR TWO-FIVE;  Surgeon: Erline Levine, MD;  Location: [facility];  Service: Neurosurgery;  Laterality: Bilateral;   PANCREATIC STENT PLACEMENT  08/27/2021   Procedure: PANCREATIC STENT PLACEMENT;  Surgeon: Rush Landmark Telford Nab., MD;  Location: [facility];  Service: Gastroenterology;;   Lavell Islam REMOVAL  10/08/2021   Procedure: STENT REMOVAL;  Surgeon: Irving Copas., MD;  Location: [facility];  Service: Gastroenterology;;   TUBAL LIGATION  1974    SOCIAL HISTORY:  Social History   Socioeconomic History   Marital status: Widowed    Spouse name: Not on file    Number of children: Not on file   Years of education: Not on file   Highest education level: Not on file  Occupational History   Not on file  Tobacco Use   Smoking status: Never   Smokeless tobacco: Never  Substance and Sexual Activity   Alcohol use: No   Drug use: Never   Sexual activity: Not on file  Other Topics Concern   Not on file  Social History Narrative   Not on file   Social Determinants of Health   Financial Resource Strain: Not on file  Food Insecurity: Not on file  Transportation Needs: Not on file  Physical Activity: Not on file  Stress: Not on file  Social Connections: Not on file  Intimate Partner Violence: Not on file    FAMILY HISTORY:  Family History  Problem Relation Age of Onset   Pancreatitis Brother        alcoholic pancreatitis   Pancreatic cancer Neg Hx    Colon cancer Neg Hx     CURRENT MEDICATIONS:  Current Outpatient Medications  Medication Sig Dispense Refill   acetaminophen (TYLENOL) 500 MG tablet Take 2 tablets (1,000 mg total) by mouth every 8 (eight) hours as needed for moderate pain, headache or fever. (Patient taking differently: Take 750-1,000 mg by mouth every 8 (eight) hours as needed for moderate pain, headache or fever.)     alum & mag hydroxide-simeth (MAALOX/MYLANTA) 200-200-20 MG/5ML suspension Take 30 mLs by mouth every 4 (four) hours as needed for indigestion. 355 mL 0   Aromatic Inhalants (VICKS VAPOINHALER) INHA Inhale 1  puff into the lungs daily as needed (congestion).     buPROPion (WELLBUTRIN XL) 300 MG 24 hr tablet Take 300 mg by mouth daily after breakfast.     busPIRone (BUSPAR) 10 MG tablet Take 20 mg by mouth 2 (two) times daily.     calcium carbonate (TUMS - DOSED IN MG ELEMENTAL CALCIUM) 500 MG chewable tablet Chew 1 tablet (200 mg of elemental calcium total) by mouth 2 (two) times daily as needed for indigestion or heartburn. (Patient taking differently: Chew 1-2 tablets by mouth 2 (two) times daily as needed for  indigestion or heartburn.)     Cholecalciferol (VITAMIN D3) 125 MCG (5000 UT) CAPS Take 5,000 Units by mouth every other day.     escitalopram (LEXAPRO) 20 MG tablet Take 20 mg by mouth at bedtime.     fluorouracil CALGB 75916 1,920 mg/m2 in sodium chloride 0.9 % 150 mL Inject 1,920 mg/m2 into the vein over 48 hr.     lactose free nutrition (BOOST) LIQD Take 237 mLs by mouth 2 (two) times daily between meals.     lactulose (CHRONULAC) 10 GM/15ML solution Take 15 mLs (10 g total) by mouth at bedtime. Take 15 ml at bedtime every night to assist with bowel movements. If bowel movement has not occurred in 3-4 days take 15 ml every 3 hours until bowel movement has occurred. (Patient not taking: Reported on 01/22/2022) 236 mL 0   LEUCOVORIN CALCIUM IV Inject into the vein every 14 (fourteen) days.     linaclotide (LINZESS) 72 MCG capsule Take 1 capsule (72 mcg total) by mouth daily before breakfast. 30 capsule 12   metoprolol succinate (TOPROL-XL) 25 MG 24 hr tablet Take 25 mg by mouth daily after breakfast.     mirtazapine (REMERON) 30 MG tablet Take 30 mg by mouth at bedtime.     omeprazole (PRILOSEC) 40 MG capsule Take 1 capsule (40 mg total) by mouth daily. 30 capsule 12   OXALIPLATIN IV Inject into the vein every 14 (fourteen) days.     oxyCODONE (OXY IR/ROXICODONE) 5 MG immediate release tablet Take 1 tablet (5 mg total) by mouth every 6 (six) hours as needed for severe pain. (Patient not taking: Reported on 01/22/2022) 30 tablet 0   QUEtiapine (SEROQUEL) 200 MG tablet Take 200 mg by mouth at bedtime.     QUEtiapine (SEROQUEL) 25 MG tablet Take 25 mg by mouth 3 (three) times daily after meals.     sodium chloride (OCEAN) 0.65 % SOLN nasal spray Place 1 spray into both nostrils as needed for congestion.     vitamin B-12 (CYANOCOBALAMIN) 500 MCG tablet Take 1 tablet (500 mcg total) by mouth daily. 30 tablet 2   lidocaine-prilocaine (EMLA) cream Apply a small amount to port a cath site (do not rub in)  and cover with plastic wrap 1 hour prior to infusion appointments 30 g 3   neomycin-bacitracin-polymyxin (NEOSPORIN) OINT Apply 1 Application topically as needed for wound care.     ondansetron (ZOFRAN-ODT) 8 MG disintegrating tablet Take 1 tablet (8 mg total) by mouth every 8 (eight) hours as needed for nausea or vomiting. 30 tablet 0   Polyethyl Glycol-Propyl Glycol (SYSTANE OP) Place 1 drop into both eyes at bedtime.     prochlorperazine (COMPAZINE) 10 MG tablet Take 1 tablet (10 mg total) by mouth every 6 (six) hours as needed for nausea or vomiting (Nausea or vomiting). 60 tablet 3   No current facility-administered medications for this visit.  ALLERGIES:  Allergies  Allergen Reactions   Other Hives and Other (See Comments)    Cherry wood just cut- "smelled it and broke out"; cannot tolerate ANY cherry fragrances, either      Cherry Hives   Wound Dressing Adhesive Rash and Other (See Comments)    Band-Aids = local reaction    PHYSICAL EXAM:  Performance status (ECOG): 1 - Symptomatic but completely ambulatory  There were no vitals filed for this visit.   Wt Readings from Last 3 Encounters:  01/12/22 122 lb 2.2 oz (55.4 kg)  12/29/21 121 lb 6.4 oz (55.1 kg)  12/15/21 115 lb 11.2 oz (52.5 kg)   Physical Exam Vitals reviewed.  Constitutional:      Appearance: Normal appearance.  Cardiovascular:     Rate and Rhythm: Normal rate and regular rhythm.     Pulses: Normal pulses.     Heart sounds: Normal heart sounds.  Pulmonary:     Effort: Pulmonary effort is normal.     Breath sounds: Normal breath sounds.  Neurological:     General: No focal deficit present.     Mental Status: She is alert and oriented to person, place, and time.  Psychiatric:        Mood and Affect: Mood normal.        Behavior: Behavior normal.      LABORATORY DATA:  I have reviewed the labs as listed.     Latest Ref Rng & Units 01/12/2022    7:52 AM 12/29/2021    8:00 AM 11/24/2021     8:32 AM  CBC  WBC 4.0 - 10.5 K/uL 7.3  3.6  12.5   Hemoglobin 12.0 - 15.0 g/dL 11.2  11.3  11.5   Hematocrit 36.0 - 46.0 % 34.9  35.0  35.1   Platelets 150 - 400 K/uL 133  184  201       Latest Ref Rng & Units 01/12/2022    7:52 AM 12/29/2021    8:00 AM 11/24/2021    8:32 AM  CMP  Glucose 70 - 99 mg/dL 124  101  108   BUN 8 - 23 mg/dL _0 Creatinine 0.44 - 1.00 mg/dL 0.78  0.71  0.79   Sodium 135 - 145 mmol/L 140  142  140   Potassium 3.5 - 5.1 mmol/L 3.7  4.0  3.8   Chloride 98 - 111 mmol/L 109  106  106   CO2 22 - 32 mmol/L _1 Calcium 8.9 - 10.3 mg/dL 8.7  9.2  9.1   Total Protein 6.5 - 8.1 g/dL 6.5  6.6  6.5   Total Bilirubin 0.3 - 1.2 mg/dL 0.4  0.3  0.3   Alkaline Phos 38 - 126 U/L 145  118  119   AST 15 - 41 U/L 28  30  46   ALT 0 - 44 U/L 28  32  58     DIAGNOSTIC IMAGING:  I have independently reviewed the scans and discussed with the patient.    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 - EGD/EUS (07/09/2021) by Dr. Rush Landmark: The region where the PD is strictured, darker appearance was identified.  There was no clear hypoechoic mass.  Endosonographic appearance suggestive of either chronic pancreatitis changes versus possible malignancy.  Endosonographic staging T1 N0 MX.  Pancreatic parenchymal abnormalities consisting of atrophy and hyperechoic strands.  No sign of significant pathology in the CBD.  No malignant appearing lymph nodes in the celiac region, peripancreatic region and porta hepatic region - Pathology (pancreatic duct FNA): Malignant cells consistent with adenocarcinoma - CA 19-9: 31 (0-35) - CTAP (07/13/2021): Fluid collection extending posterior to the gastric antrum and ventral to the pancreas.  Elongated collection extends to the greater curvature of the stomach with inflammation surrounding the  collection suggestive of a pancreatic pseudocyst.  Chronic dilatation of pancreatic duct with abrupt termination of the duct dilatation in the head of the pancreas. - CT chest (07/16/2021): No evidence of metastatic disease in the chest. - 8 to 9 pound weight loss in the last 3 months, decreased appetite.       - PET scan (07/30/2021): Faint focus of increased metabolic activity, SUV 3.6, measuring 9 mm at the junction of the pancreatic head and body, suspicious for pancreatic adenocarcinoma.  No obvious extrapancreatic spread of tumor.  Cystic lesion along the posterior margin of the stomach and anterior margin of the pancreatic head favored pseudocyst.  Small amount of pelvic ascites. - MRI of the abdomen pancreatic protocol (08/03/2021): Similar appearance of the pancreas, with abrupt transition from dilated to decompressed duct in the pancreatic head with no evidence of underlying mass.  Pseudocyst enlarged position between pancreatic head and GE junction with significant mass effect upon the pyloric region and gastric outlet obstruction radiologically.  No evidence of abdominal metastatic disease. - FOLFOX on 09/16/2021, FOLFIRINOX cycle 2 on 09/30/2021    Social/family history: - She is accompanied by her son [name] today.  She lives at home by herself.  She is independent of ADLs and IADLs.  She did office work prior to retirement.  Non-smoker and nonalcoholic. - Brother (alcoholic) and maternal half uncle had pancreatitis.  No history of malignancy.  3. Pulmonary embolism: - She was diagnosed with pulmonary embolism in January 2023 and then will hospital.  There does not appear to be any provoking factors for PE.  Reportedly Doppler was negative for DVT.  She was treated with Eliquis for 60 days, discontinued after that.   PLAN:  Stage I (T1 N0 MX) pancreatic adenocarcinoma: - CTAP pancreatic protocol on 12/08/2021 showed unchanged appearance of the pancreas with no evidence of lymphadenopathy or  metastatic disease.  Previously seen large pseudocyst anterior to the Peri pancreatic head is better. - I have communicated with Dr. Zenia Resides.  She wanted [place] to proceed with another cycle of chemotherapy with tentative date of surgery on 02/01/2022. - Reviewed labs which showed normal LFTs.  White count is 3.6 with normal ANC and platelet count. - Proceed with cycle 7 today.  RTC 2 weeks for follow-up with possible chemotherapy.  2.  Weight loss: - She is eating well.  She gained 6 pounds since last visit.    Orders placed this encounter:  Orders Placed This Encounter  Procedures   CBC with Differential         [name], MD [facility] [phone]

## 2022-01-26 ENCOUNTER — Other Ambulatory Visit: Payer: Medicare Other

## 2022-01-26 ENCOUNTER — Ambulatory Visit: Payer: Medicare Other | Admitting: Hematology

## 2022-01-26 ENCOUNTER — Other Ambulatory Visit (HOSPITAL_COMMUNITY): Payer: Medicare Other

## 2022-01-26 NOTE — Pre-Procedure Instructions (Signed)
Surgical Instructions    Your procedure is scheduled on Monday, January 22.  Report to [facility] Main Entrance "A" at 5:30 A.M., then check in with the Admitting office.  Call this number if you have problems the morning of surgery:  [phone]   If you have any questions prior to your surgery date call [phone]: Open Monday-Friday 8am-4pm If you experience any cold or flu symptoms such as cough, fever, chills, shortness of breath, etc. between now and your scheduled surgery, please notify us at the above number     Remember:  Do not eat after midnight the night before your surgery  You may drink clear liquids until 4:30AM the morning of your surgery.   Clear liquids allowed are: Water, Non-Citrus Juices (without pulp), Carbonated Beverages, Clear Tea, Black Coffee ONLY (NO MILK, CREAM OR POWDERED CREAMER of any kind), and Gatorade  Patient Instructions  The night before surgery:   The night before surgery (if you do NOT have diabetes):  Drink TWO (2) Pre-Surgery Clear Ensure then night before surgery. Drink in one sitting. Do not sip.  This drink was given to you during your hospital  pre-op appointment visit.  No food after midnight. ONLY clear liquids after midnight  The day of surgery (if you do NOT have diabetes):  Drink ONE (1) Pre-Surgery Clear Ensure by 4:30AM the morning of surgery. Drink in one sitting. Do not sip.  This drink was given to you during your hospital  pre-op appointment visit.  Nothing else to drink after completing the  Pre-Surgery Clear Ensure.           If you have questions, please contact your surgeon's office.     Take these medicines the morning of surgery with A SIP OF WATER:  buPROPion (WELLBUTRIN XL)  busPIRone (BUSPAR)  metoprolol succinate (TOPROL-XL)  omeprazole (PRILOSEC)  QUEtiapine (SEROQUEL)   ondansetron (ZOFRAN-ODT)  if needed acetaminophen (TYLENOL)  if needed prochlorperazine (COMPAZINE)  if needed sodium chloride  (OCEAN) 0.65 % SOLN nasal spray  if needed  As of today, STOP taking any Aspirin (unless otherwise instructed by your surgeon) Aleve, Naproxen, Ibuprofen, Motrin, Advil, Goody's, BC's, all herbal medications, fish oil, and all vitamins.             [facility] is not responsible for any belongings or valuables.    Do NOT Smoke (Tobacco/Vaping)  24 hours prior to your procedure  If you use a CPAP at night, you may bring your mask for your overnight stay.   Contacts, glasses, hearing aids, dentures or partials may not be worn into surgery, please bring cases for these belongings   For patients admitted to the hospital, discharge time will be determined by your treatment team.   Patients discharged the day of surgery will not be allowed to drive home, and someone needs to stay with them for 24 hours.   SURGICAL WAITING ROOM VISITATION Patients having surgery or a procedure may have no more than 2 support people in the waiting area - these visitors may rotate.   Children under the age of 71 must have an adult with them who is not the patient. If the patient needs to stay at the hospital during part of their recovery, the visitor guidelines for inpatient rooms apply. Pre-op nurse will coordinate an appropriate time for 1 support person to accompany patient in pre-op.  This support person may not rotate.   Please refer to [email or web address] for the visitor guidelines for Inpatients (after  your surgery is over and you are in a regular room).    Special instructions:    Oral Hygiene is also important to reduce your risk of infection.  Remember - BRUSH YOUR TEETH THE MORNING OF SURGERY WITH YOUR REGULAR TOOTHPASTE   [facility]- Preparing For Surgery  Before surgery, you can play an important role. Because skin is not sterile, your skin needs to be as free of germs as possible. You can reduce the number of germs on your skin by washing  with CHG (chlorahexidine gluconate) Soap before surgery.  CHG is an antiseptic cleaner which kills germs and bonds with the skin to continue killing germs even after washing.     Please do not use if you have an allergy to CHG or antibacterial soaps. If your skin becomes reddened/irritated stop using the CHG.  Do not shave (including legs and underarms) for at least 48 hours prior to first CHG shower. It is OK to shave your face.  Please follow these instructions carefully.     Shower the NIGHT BEFORE SURGERY and the MORNING OF SURGERY with CHG Soap.   If you chose to wash your hair, wash your hair first as usual with your normal shampoo. After you shampoo, rinse your hair and body thoroughly to remove the shampoo.  Then [employer] and genitals (private parts) with your normal soap and rinse thoroughly to remove soap.  After that Use CHG Soap as you would any other liquid soap. You can apply CHG directly to the skin and wash gently with a scrungie or a clean washcloth.   Apply the CHG Soap to your body ONLY FROM THE NECK DOWN.  Do not use on open wounds or open sores. Avoid contact with your eyes, ears, mouth and genitals (private parts). Wash Face and genitals (private parts)  with your normal soap.   Wash thoroughly, paying special attention to the area where your surgery will be performed.  Thoroughly rinse your body with warm water from the neck down.  DO NOT shower/wash with your normal soap after using and rinsing off the CHG Soap.  Pat yourself dry with a CLEAN TOWEL.  Wear CLEAN PAJAMAS to bed the night before surgery  Place CLEAN SHEETS on your bed the night before your surgery  DO NOT SLEEP WITH PETS.   Day of Surgery:  Take a shower with CHG soap. Wear Clean/Comfortable clothing the morning of surgery Do not wear jewelry or makeup. Do not wear lotions, powders, perfumes/cologne or deodorant. Do not shave 48 hours prior to surgery.  Men may shave face and neck. Do not  bring valuables to the hospital. Do not wear nail polish, gel polish, artificial nails, or any other type of covering on natural nails (fingers and toes) If you have artificial nails or gel coating that need to be removed by a nail salon, please have this removed prior to surgery. Artificial nails or gel coating may interfere with anesthesia's ability to adequately monitor your vital signs. Remember to brush your teeth WITH YOUR REGULAR TOOTHPASTE.    If you received a COVID test during your pre-op visit, it is requested that you wear a mask when out in public, stay away from anyone that may not be feeling well, and notify your surgeon if you develop symptoms. If you have been in contact with anyone that has tested positive in the last 10 days, please notify your surgeon.    Please read over the following fact sheets that  you were given.

## 2022-01-27 ENCOUNTER — Encounter (HOSPITAL_COMMUNITY): Payer: Self-pay

## 2022-01-27 ENCOUNTER — Encounter (HOSPITAL_COMMUNITY)
Admission: RE | Admit: 2022-01-27 | Discharge: 2022-01-27 | Disposition: A | Discharge status: Home / Self Care | Payer: Medicare Other | Source: Ambulatory Visit | Attending: Hematology | Admitting: Hematology

## 2022-01-27 ENCOUNTER — Other Ambulatory Visit: Payer: Self-pay

## 2022-01-27 VITALS — BP 132/76 | HR 77 | Temp 98.0°F | Resp 18 | Ht 63.0 in | Wt 122.9 lb

## 2022-01-27 DIAGNOSIS — I2510 Atherosclerotic heart disease of native coronary artery without angina pectoris: Secondary | ICD-10-CM | POA: Diagnosis not present

## 2022-01-27 DIAGNOSIS — Z01812 Encounter for preprocedural laboratory examination: Secondary | ICD-10-CM | POA: Diagnosis not present

## 2022-01-27 DIAGNOSIS — Z01818 Encounter for other preprocedural examination: Secondary | ICD-10-CM

## 2022-01-27 NOTE — Progress Notes (Addendum)
PCP - Sherrilee Gilles Cardiologist - Danton Clap- pt reports she had a recent cardiac workup- records requested. Pt denies cardiac issues other than palpitations that she has had for years. Pt reports that these get worse with anxiety.   PPM/ICD - denies    Chest x-ray - N/A EKG - 07/13/21 Stress Test - pt reports having echo and stress test done within the last year- records requested ECHO -  Cardiac Cath - N/A  Sleep Study - N/A   Fasting Blood Sugar - N/A   Last dose of GLP1 agonist-  N/A   Blood Thinner Instructions:N/A Aspirin Instructions:N/A  ERAS Protcol -ERAS + ensures   COVID TEST- N/A   Anesthesia review: Follow up requested records. Patient had labs done on 01/12/22. Pt has appointment with hematologist on 02/11/22. Labs ordered for future in epic per hematologist. Per Jeneen Rinks PA, ok to hold off on lab work today. If lab work not complete after 02/11/22, then lab work can be done [employer]. Pt needs T/S on day of surgery.   Pt reports that she is taking Keflex for a UTI. Pt states she had burning with urination yesterday, and she will complete her course of antibiotics prior to surgery. Pt states she will notify MD if symptoms worsen/don't get better.   Pt reports she has had some thoughts of harming herself/ending her life within the past month. Pt states she is not actively having these thoughts and has talked herself out of these thoughts. Pt reports she has felt much better since starting medications for anxiety/depression. Pt states that she does not need to talk to anyone today about these past thoughts. Pt states she has an MD that she sees for her anxiety and depression, and she will reach out to this MD if needed. Pt states she also has friends and family she can talk to. Jeneen Rinks, [place] aware.   Patient denies shortness of breath, fever, cough and chest pain at PAT appointment   All instructions explained to the patient, with a verbal understanding of the material.  Patient agrees to go over the instructions while at home for a better understanding.  The opportunity to ask questions was provided.

## 2022-02-04 NOTE — Anesthesia Preprocedure Evaluation (Addendum)
Anesthesia Evaluation  Patient identified by MRN, date of birth, ID band Patient awake    Reviewed: Allergy & Precautions, NPO status , Patient's Chart, lab work & pertinent test results  Airway Mallampati: II  TM Distance: >3 FB Neck ROM: Full    Dental no notable dental hx.    Pulmonary PE   Pulmonary exam normal        Cardiovascular hypertension, Pt. on medications and Pt. on home beta blockers  Rhythm:Regular Rate:Normal     Neuro/Psych   Anxiety Depression    negative neurological ROS     GI/Hepatic Neg liver ROS,GERD  ,,Pancreatic Ca   Endo/Other  negative endocrine ROS    Renal/GU negative Renal ROS  negative genitourinary   Musculoskeletal  (+) Arthritis , Osteoarthritis,    Abdominal Normal abdominal exam  (+)   Peds  Hematology  (+) Blood dyscrasia, anemia Lab Results      Component                Value               Date                      WBC                      4.2                 02/15/2022                HGB                      11.2 (L)            02/15/2022                HCT                      34.3 (L)            02/15/2022                MCV                      104.9 (H)           02/15/2022                PLT                      166                 02/15/2022             Lab Results      Component                Value               Date                      NA                       139                 02/15/2022                K  4.1                 02/15/2022                CO2                      25                  02/15/2022                GLUCOSE                  85                  02/15/2022                BUN                      29 (H)              02/15/2022                CREATININE               0.86                02/15/2022                CALCIUM                  8.6 (L)             02/15/2022                GFRNONAA                 >60                  02/15/2022              Anesthesia Other Findings   Reproductive/Obstetrics                             Anesthesia Physical Anesthesia Plan  ASA: 3  Anesthesia Plan: General   Post-op Pain Management: Tylenol PO (pre-op)*   Induction: Intravenous  PONV Risk Score and Plan: 3 and Ondansetron and Dexamethasone  Airway Management Planned: Mask and Oral ETT  Additional Equipment: Arterial line  Intra-op Plan:   Post-operative Plan: Possible Post-op intubation/ventilation  Informed Consent: I have reviewed the patients History and Physical, chart, labs and discussed the procedure including the risks, benefits and alternatives for the proposed anesthesia with the patient or authorized representative who has indicated his/her understanding and acceptance.     Dental advisory given  Plan Discussed with: CRNA  Anesthesia Plan Comments: (PAT note by Karoline Caldwell, PA-C: Follows with cardiology at [facility] for history of HTN, HLD, PE 09/2020 (completed 3 months of [place], occurred around the same time as her cancer diagnosis), sinus tachycardia.  Exercise stress echocardiogram in May 2022 was negative for ischemia.  She was last seen 10/21/2021 and noted to be doing well from cardiac standpoint.  Discussed plans to undergo surgery related to recent diagnosis of pancreatic cancer.  No changes were made to her management.  She was recommended to follow-up in 1 year.  CMP and CBC 01/12/2022 reviewed, mild anemia with hemoglobin 11.2, mild thrombocytopenia with platelets 133, otherwise unremarkable.  EKG 07/13/2021: NSR.  Rate 85.  Low voltage QRS.  Stress echocardiography 05/30/2020 (copy on chart): Impressions: Negative for ischemia.  Hypertensive response to exercise and chest pain with exercise-though patient could not say if this discomfort was similar to what led to testing. Stress echo results: Left ventricular ejection fraction was normal at rest  and with stress.  Normal stress echo.  )        Anesthesia Quick Evaluation

## 2022-02-04 NOTE — Progress Notes (Signed)
Anesthesia Chart Review:  Follows with cardiology at [facility] for history of HTN, HLD, PE 09/2020 (completed 3 months of [place], occurred around the same time as her cancer diagnosis), sinus tachycardia.  Exercise stress echocardiogram in May 2022 was negative for ischemia.  She was last seen 10/21/2021 and noted to be doing well from cardiac standpoint.  Discussed plans to undergo surgery related to recent diagnosis of pancreatic cancer.  No changes were made to her management.  She was recommended to follow-up in 1 year.  CMP and CBC 01/12/2022 reviewed, mild anemia with hemoglobin 11.2, mild thrombocytopenia with platelets 133, otherwise unremarkable.  EKG 07/13/2021: NSR.  Rate 85.  Low voltage QRS.  Stress echocardiography 05/30/2020 (copy on chart): Impressions: Negative for ischemia.  Hypertensive response to exercise and chest pain with exercise-though patient could not say if this discomfort was similar to what led to testing. Stress echo results: Left ventricular ejection fraction was normal at rest and with stress.  Normal stress echo.   Wynonia Musty [facility] Short Stay Center/Anesthesiology Phone [phone] 02/04/2022 2:18 PM

## 2022-02-08 ENCOUNTER — Ambulatory Visit (HOSPITAL_COMMUNITY)
Admission: RE | Admit: 2022-02-08 | Discharge: 2022-02-08 | Disposition: A | Discharge status: Home / Self Care | Payer: Medicare Other | Source: Ambulatory Visit | Attending: Hematology | Admitting: Hematology

## 2022-02-08 DIAGNOSIS — C250 Malignant neoplasm of head of pancreas: Secondary | ICD-10-CM | POA: Insufficient documentation

## 2022-02-08 MED ORDER — IOHEXOL 300 MG/ML  SOLN
100.0000 mL | Freq: Once | INTRAMUSCULAR | Status: AC | PRN
  Administered 2022-02-08: 100 mL via INTRAVENOUS

## 2022-02-11 ENCOUNTER — Inpatient Hospital Stay: Payer: Medicare Other | Attending: Hematology | Admitting: Hematology

## 2022-02-11 ENCOUNTER — Encounter: Payer: Self-pay | Admitting: Hematology

## 2022-02-11 VITALS — BP 118/74 | HR 80 | Temp 97.6°F | Resp 18 | Wt 125.8 lb

## 2022-02-11 DIAGNOSIS — R634 Abnormal weight loss: Secondary | ICD-10-CM | POA: Insufficient documentation

## 2022-02-11 DIAGNOSIS — C253 Malignant neoplasm of pancreatic duct: Secondary | ICD-10-CM | POA: Diagnosis present

## 2022-02-11 DIAGNOSIS — Z800 Family history of malignant neoplasm of digestive organs: Secondary | ICD-10-CM | POA: Diagnosis not present

## 2022-02-11 DIAGNOSIS — Z86711 Personal history of pulmonary embolism: Secondary | ICD-10-CM | POA: Insufficient documentation

## 2022-02-11 DIAGNOSIS — C250 Malignant neoplasm of head of pancreas: Secondary | ICD-10-CM

## 2022-02-11 NOTE — Progress Notes (Signed)
[place],  [address]   CLINIC:  Medical Oncology/Hematology  PCP:  Sherrilee Gilles, DO [address] [place] [address] [phone]   REASON FOR VISIT:  Follow-up for pancreatic cancer  PRIOR THERAPY: none  NGS Results: not done  CURRENT THERAPY: 8 cycles of FOLFIRINOX completed on 01/12/2022  BRIEF ONCOLOGIC HISTORY:  Oncology History  Malignant neoplasm of pancreas ([place])  07/21/2021 Initial Diagnosis   Malignant neoplasm of pancreas ([place])   09/16/2021 -  Chemotherapy   Patient is on Treatment Plan : PANCREAS Modified FOLFIRINOX q14d x 4 cycles      Genetic Testing   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 VUS in ATM called c.4367G>A and in [facility] called c.238G>A also identified. The remainder of testing was negative/normal. The report date is 09/20/2021.  The CancerNext-Expanded + RNAinsight gene panel offered by [employer] and includes sequencing and rearrangement analysis for the following 77 genes: IP, ALK, APC*, ATM*, AXIN2, BAP1, BARD1, BLM, BMPR1A, BRCA1*, BRCA2*, BRIP1*, CDC73, CDH1*,CDK4, CDKN1B, CDKN2A, CHEK2*, CTNNA1, DICER1, FANCC, FH, FLCN, GALNT12, KIF1B, LZTR1, MAX, MEN1, MET, MLH1*, MSH2*, MSH3, MSH6*, MUTYH*, NBN, NF1*, NF2, NTHL1, PALB2*, PHOX2B, PMS2*, POT1, PRKAR1A, PTCH1, PTEN*, RAD51C*, RAD51D*,RB1, RECQL, RET, SDHA, SDHAF2, SDHB, SDHC, SDHD, SMAD4, SMARCA4, SMARCB1, SMARCE1, STK11, SUFU, TMEM127, TP53*,TSC1, TSC2, VHL and XRCC2 (sequencing and deletion/duplication); EGFR, EGLN1, HOXB13, KIT, MITF, PDGFRA, POLD1 and POLE (sequencing only); EPCAM and GREM1 (deletion/duplication only).     CANCER STAGING:  Cancer Staging  Malignant neoplasm of pancreas [facility]) Staging form: Exocrine Pancreas, AJCC 8th Edition -  Clinical stage from 07/29/2021: Stage IA (cT1, cN0, cM0) - Unsigned   INTERVAL HISTORY:  Ms. Dynasty Holquin, a 77 y.o. female, seen for follow-up of pancreatic cancer.  She has finished cycle 8 on 01/12/2022.  She reportedly had a recent UTI which was treated with Keflex.  She was wondering if that is sufficient.  She still has very mild burning.  Her primary doctor reportedly sent a UA.  She is waiting a callback from them.  REVIEW OF SYSTEMS:  Review of Systems  Constitutional:  Negative for appetite change.  Psychiatric/Behavioral:  The patient is nervous/anxious.   All other systems reviewed and are negative.   PAST MEDICAL/SURGICAL HISTORY:  Past Medical History:  Diagnosis Date   Anxiety    Arthritis    Depression    Dysrhythmia    hx palpitations greater than 5 yrs -neg echo, stress per pt ? where or dr   GERD (gastroesophageal reflux disease)    occ   Nausea & vomiting 08/24/2021   Pancreatic adenocarcinoma ([place])    Pancreatic pseudocyst    Port-A-Cath in place 09/15/2021   Pulmonary embolism [facility])    September 2022   Past Surgical History:  Procedure Laterality Date   ANTERIOR LAT LUMBAR FUSION Right 12/16/2015   Procedure: RIGHT LUMBAR TWO-THREE, LUMBAR THREE-FOUR, LUMBAR FOUR-FIVE ANTEROLATERAL LUMBAR INTERBODY FUSION;  Surgeon: Erline Levine, MD;  Location: [place];  Service: Neurosurgery;  Laterality: Right;   BALLOON DILATION N/A 08/27/2021   Procedure: BALLOON DILATION;  Surgeon: Rush Landmark Telford Nab., MD;  Location: [facility];  Service: Gastroenterology;  Laterality: N/A;   BIOPSY  07/09/2021   Procedure: BIOPSY;  Surgeon: Rush Landmark Telford Nab., MD;  Location: [place];  Service: Gastroenterology;;   CYST  GASTROSTOMY  08/27/2021   Procedure: CYST GASTROSTOMY;  Surgeon: Rush Landmark Telford Nab., MD;  Location: [facility];  Service: Gastroenterology;;   ESOPHAGOGASTRODUODENOSCOPY N/A 07/09/2021   Procedure: ESOPHAGOGASTRODUODENOSCOPY (EGD);  Surgeon: Irving Copas., MD;  Location: [place];  Service: Gastroenterology;  Laterality: N/A;   ESOPHAGOGASTRODUODENOSCOPY (EGD) WITH PROPOFOL N/A 08/27/2021   Procedure: ESOPHAGOGASTRODUODENOSCOPY (EGD) WITH PROPOFOL;  Surgeon: Rush Landmark Telford Nab., MD;  Location: [facility];  Service: Gastroenterology;  Laterality: N/A;   ESOPHAGOGASTRODUODENOSCOPY (EGD) WITH PROPOFOL N/A 10/08/2021   Procedure: ESOPHAGOGASTRODUODENOSCOPY (EGD) WITH PROPOFOL;  Surgeon: Rush Landmark Telford Nab., MD;  Location: [facility];  Service: Gastroenterology;  Laterality: N/A;   EUS N/A 07/09/2021   Procedure: UPPER ENDOSCOPIC ULTRASOUND (EUS) RADIAL;  Surgeon: Irving Copas., MD;  Location: [place];  Service: Gastroenterology;  Laterality: N/A;   EUS N/A 08/27/2021   Procedure: UPPER ENDOSCOPIC ULTRASOUND (EUS) LINEAR;  Surgeon: Irving Copas., MD;  Location: [facility];  Service: Gastroenterology;  Laterality: N/A;   FINE NEEDLE ASPIRATION  07/09/2021   Procedure: FINE NEEDLE ASPIRATION (FNA) LINEAR;  Surgeon: Irving Copas., MD;  Location: [place];  Service: Gastroenterology;;   IR IMAGING GUIDED PORT INSERTION  09/14/2021   LUMBAR PERCUTANEOUS PEDICLE SCREW 3 LEVEL Bilateral 12/16/2015   Procedure: PERCUTANEOUS PEDICLE SCREWS BILATERALLY AT LUMBAR TWO-FIVE;  Surgeon: Erline Levine, MD;  Location: [place];  Service: Neurosurgery;  Laterality: Bilateral;   PANCREATIC STENT PLACEMENT  08/27/2021   Procedure: PANCREATIC STENT PLACEMENT;  Surgeon: Rush Landmark Telford Nab., MD;  Location: [facility];  Service: Gastroenterology;;   Lavell Islam REMOVAL  10/08/2021   Procedure: STENT REMOVAL;  Surgeon: Irving Copas., MD;  Location: [facility];  Service: Gastroenterology;;   TUBAL LIGATION  1974    SOCIAL HISTORY:  Social History   Socioeconomic History   Marital status: Widowed    Spouse name: Not on file   Number of children: Not on file   Years of education: Not on file   Highest  education level: Not on file  Occupational History   Not on file  Tobacco Use   Smoking status: Never   Smokeless tobacco: Never  Vaping Use   Vaping Use: Never used  Substance and Sexual Activity   Alcohol use: No   Drug use: Never   Sexual activity: Not on file  Other Topics Concern   Not on file  Social History Narrative   Not on file   Social Determinants of Health   Financial Resource Strain: Not on file  Food Insecurity: Not on file  Transportation Needs: Not on file  Physical Activity: Not on file  Stress: Not on file  Social Connections: Not on file  Intimate Partner Violence: Not on file    FAMILY HISTORY:  Family History  Problem Relation Age of Onset   Pancreatitis Brother        alcoholic pancreatitis   Pancreatic cancer Neg Hx    Colon cancer Neg Hx     CURRENT MEDICATIONS:  Current Outpatient Medications  Medication Sig Dispense Refill   acetaminophen (TYLENOL) 500 MG tablet Take 2 tablets (1,000 mg total) by mouth every 8 (eight) hours as needed for moderate pain, headache or fever. (Patient taking differently: Take 750-1,000 mg by mouth every 8 (eight) hours as needed for moderate pain, headache or fever.)     alum & mag hydroxide-simeth (MAALOX/MYLANTA) 200-200-20 MG/5ML suspension Take 30 mLs by mouth every 4 (four) hours as needed for indigestion. 355 mL 0   Aromatic Inhalants (  VICKS VAPOINHALER) INHA Inhale 1 puff into the lungs daily as needed (congestion).     buPROPion (WELLBUTRIN XL) 300 MG 24 hr tablet Take 300 mg by mouth daily after breakfast.     busPIRone (BUSPAR) 10 MG tablet Take 20 mg by mouth 2 (two) times daily.     calcium carbonate (TUMS - DOSED IN MG ELEMENTAL CALCIUM) 500 MG chewable tablet Chew 1 tablet (200 mg of elemental calcium total) by mouth 2 (two) times daily as needed for indigestion or heartburn. (Patient taking differently: Chew 1-2 tablets by mouth 2 (two) times daily as needed for indigestion or heartburn.)      cephALEXin (KEFLEX) 500 MG capsule Take 500 mg by mouth 2 (two) times daily.     Cholecalciferol (VITAMIN D3) 125 MCG (5000 UT) CAPS Take 5,000 Units by mouth every other day.     escitalopram (LEXAPRO) 20 MG tablet Take 20 mg by mouth at bedtime.     fluorouracil CALGB 41287 1,920 mg/m2 in sodium chloride 0.9 % 150 mL Inject 1,920 mg/m2 into the vein over 48 hr.     lactose free nutrition (BOOST) LIQD Take 237 mLs by mouth 2 (two) times daily between meals.     lactulose (CHRONULAC) 10 GM/15ML solution Take 15 mLs (10 g total) by mouth at bedtime. Take 15 ml at bedtime every night to assist with bowel movements. If bowel movement has not occurred in 3-4 days take 15 ml every 3 hours until bowel movement has occurred. 236 mL 0   LEUCOVORIN CALCIUM IV Inject into the vein every 14 (fourteen) days.     lidocaine-prilocaine (EMLA) cream Apply a small amount to port a cath site (do not rub in) and cover with plastic wrap 1 hour prior to infusion appointments 30 g 3   linaclotide (LINZESS) 72 MCG capsule Take 1 capsule (72 mcg total) by mouth daily before breakfast. 30 capsule 12   metoprolol succinate (TOPROL-XL) 25 MG 24 hr tablet Take 25 mg by mouth daily after breakfast.     mirtazapine (REMERON) 30 MG tablet Take 30 mg by mouth at bedtime.     neomycin-bacitracin-polymyxin (NEOSPORIN) OINT Apply 1 Application topically as needed for wound care.     omeprazole (PRILOSEC) 40 MG capsule Take 1 capsule (40 mg total) by mouth daily. 30 capsule 12   ondansetron (ZOFRAN-ODT) 8 MG disintegrating tablet Take 1 tablet (8 mg total) by mouth every 8 (eight) hours as needed for nausea or vomiting. 30 tablet 0   OXALIPLATIN IV Inject into the vein every 14 (fourteen) days.     oxyCODONE (OXY IR/ROXICODONE) 5 MG immediate release tablet Take 1 tablet (5 mg total) by mouth every 6 (six) hours as needed for severe pain. 30 tablet 0   Polyethyl Glycol-Propyl Glycol (SYSTANE OP) Place 1 drop into both eyes at bedtime.      prochlorperazine (COMPAZINE) 10 MG tablet Take 1 tablet (10 mg total) by mouth every 6 (six) hours as needed for nausea or vomiting (Nausea or vomiting). 60 tablet 3   QUEtiapine (SEROQUEL) 200 MG tablet Take 200 mg by mouth at bedtime.     QUEtiapine (SEROQUEL) 25 MG tablet Take 25 mg by mouth 3 (three) times daily after meals.     sodium chloride (OCEAN) 0.65 % SOLN nasal spray Place 1 spray into both nostrils as needed for congestion.     vitamin B-12 (CYANOCOBALAMIN) 500 MCG tablet Take 1 tablet (500 mcg total) by mouth daily. 30 tablet 2  No current facility-administered medications for this visit.    ALLERGIES:  Allergies  Allergen Reactions   Other Hives and Other (See Comments)    Cherry wood just cut- "smelled it and broke out"; cannot tolerate ANY cherry fragrances, either      Cherry Hives   Wound Dressing Adhesive Rash and Other (See Comments)    Band-Aids = local reaction    PHYSICAL EXAM:  Performance status (ECOG): 1 - Symptomatic but completely ambulatory  Vitals:   02/11/22 1535  BP: 118/74  Pulse: 80  Resp: 18  Temp: 97.6 F (36.4 C)  SpO2: 97%     Wt Readings from Last 3 Encounters:  02/11/22 125 lb 12.8 oz (57.1 kg)  01/27/22 122 lb 14.4 oz (55.7 kg)  01/12/22 122 lb 2.2 oz (55.4 kg)   Physical Exam Vitals reviewed.  Constitutional:      Appearance: Normal appearance.  Cardiovascular:     Rate and Rhythm: Normal rate and regular rhythm.     Pulses: Normal pulses.     Heart sounds: Normal heart sounds.  Pulmonary:     Effort: Pulmonary effort is normal.     Breath sounds: Normal breath sounds.  Neurological:     General: No focal deficit present.     Mental Status: She is alert and oriented to person, place, and time.  Psychiatric:        Mood and Affect: Mood normal.        Behavior: Behavior normal.      LABORATORY DATA:  I have reviewed the labs as listed.     Latest Ref Rng & Units 01/12/2022    7:52 AM 12/29/2021    8:00 AM  11/24/2021    8:32 AM  CBC  WBC 4.0 - 10.5 K/uL 7.3  3.6  12.5   Hemoglobin 12.0 - 15.0 g/dL 11.2  11.3  11.5   Hematocrit 36.0 - 46.0 % 34.9  35.0  35.1   Platelets 150 - 400 K/uL 133  184  201       Latest Ref Rng & Units 01/12/2022    7:52 AM 12/29/2021    8:00 AM 11/24/2021    8:32 AM  CMP  Glucose 70 - 99 mg/dL 124  101  108   BUN 8 - 23 mg/dL '30  26  22   '$ Creatinine 0.44 - 1.00 mg/dL 0.78  0.71  0.79   Sodium 135 - 145 mmol/L 140  142  140   Potassium 3.5 - 5.1 mmol/L 3.7  4.0  3.8   Chloride 98 - 111 mmol/L 109  106  106   CO2 22 - 32 mmol/L '25  26  26   '$ Calcium 8.9 - 10.3 mg/dL 8.7  9.2  9.1   Total Protein 6.5 - 8.1 g/dL 6.5  6.6  6.5   Total Bilirubin 0.3 - 1.2 mg/dL 0.4  0.3  0.3   Alkaline Phos 38 - 126 U/L 145  118  119   AST 15 - 41 U/L 28  30  46   ALT 0 - 44 U/L 28  32  58     DIAGNOSTIC IMAGING:  I have independently reviewed the scans and discussed with the patient.    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 - EGD/EUS (07/09/2021) by Dr. Rush Landmark: The region where the PD is strictured, darker appearance was  identified.  There was no clear hypoechoic mass.  Endosonographic appearance suggestive of either chronic pancreatitis changes versus possible malignancy.  Endosonographic staging T1 N0 MX.  Pancreatic parenchymal abnormalities consisting of atrophy and hyperechoic strands.  No sign of significant pathology in the CBD.  No malignant appearing lymph nodes in the celiac region, peripancreatic region and porta hepatic region - Pathology (pancreatic duct FNA): Malignant cells consistent with adenocarcinoma - CA 19-9: 31 (0-35) - CTAP (07/13/2021): Fluid collection extending posterior to the gastric antrum and ventral to the pancreas.  Elongated collection extends to the greater curvature of the stomach with inflammation  surrounding the collection suggestive of a pancreatic pseudocyst.  Chronic dilatation of pancreatic duct with abrupt termination of the duct dilatation in the head of the pancreas. - CT chest (07/16/2021): No evidence of metastatic disease in the chest. - 8 to 9 pound weight loss in the last 3 months, decreased appetite.       - PET scan (07/30/2021): Faint focus of increased metabolic activity, SUV 3.6, measuring 9 mm at the junction of the pancreatic head and body, suspicious for pancreatic adenocarcinoma.  No obvious extrapancreatic spread of tumor.  Cystic lesion along the posterior margin of the stomach and anterior margin of the pancreatic head favored pseudocyst.  Small amount of pelvic ascites. - MRI of the abdomen pancreatic protocol (08/03/2021): Similar appearance of the pancreas, with abrupt transition from dilated to decompressed duct in the pancreatic head with no evidence of underlying mass.  Pseudocyst enlarged position between pancreatic head and GE junction with significant mass effect upon the pyloric region and gastric outlet obstruction radiologically.  No evidence of abdominal metastatic disease. - FOLFOX on 09/16/2021, FOLFIRINOX cycle 2 on 09/30/2021    Social/family history: - She is accompanied by her son [name] today.  She lives at home by herself.  She is independent of ADLs and IADLs.  She did office work prior to retirement.  Non-smoker and nonalcoholic. - Brother (alcoholic) and maternal half uncle had pancreatitis.  No history of malignancy.  3. Pulmonary embolism: - She was diagnosed with pulmonary embolism in January 2023 and then will hospital.  There does not appear to be any provoking factors for PE.  Reportedly Doppler was negative for DVT.  She was treated with Eliquis for 60 days, discontinued after that.   PLAN:  Stage I (T1 N0 MX) pancreatic adenocarcinoma: - CTAP (02/08/2022): Stable exam with no new or progressive findings.  Similar appearance of diffuse main duct  dilatation in the body and tail of the pancreas with abrupt truncation at the level of the pancreatic head.  No evidence of metastatic disease in the abdomen or pelvis. - CA 19-9 is 10. - She will proceed with her surgery on 02/15/2022. - Recommend follow-up in 6 weeks to discuss pathology and further plan.  2.  Weight loss: - She is eating very well all sorts of foods.  She is continuing to gain weight.    Orders placed this encounter:  No orders of the defined types were placed in this encounter.    [name], MD [place] [phone]

## 2022-02-11 NOTE — Patient Instructions (Addendum)
[facility]  Discharge Instructions  You were seen and examined today by Dr. Delton Coombes.  Dr. Delton Coombes has reviewed your recent CT scan which is stable. The pseudocyst is gone.  Proceed with surgery as planned.  Follow-up as scheduled 4 weeks after surgery.  Thank you for choosing [facility] to provide your oncology and hematology care.   To afford each patient quality time with our provider, please arrive at least 15 minutes before your scheduled appointment time. You may need to reschedule your appointment if you arrive late (10 or more minutes). Arriving late affects you and other patients whose appointments are after yours.  Also, if you miss three or more appointments without notifying the office, you may be dismissed from the clinic at the provider's discretion.    Again, thank you for choosing [facility].  Our hope is that these requests will decrease the amount of time that you wait before being seen by our physicians.   If you have a lab appointment with the [facility] please come in thru the Main Entrance and check in at the main information desk.           _____________________________________________________________  Should you have questions after your visit to [facility], please contact our office at [phone] and follow the prompts.  Our office hours are 8:00 a.m. to 4:30 p.m. Monday - Thursday and 8:00 a.m. to 2:30 p.m. Friday.  Please note that voicemails left after 4:00 p.m. may not be returned until the following business day.  We are closed weekends and all major holidays.  You do have access to a nurse 24-7, just call the main number to the clinic [phone] and do not press any options, hold on the line and a nurse will answer the phone.    For prescription refill requests, have your pharmacy contact our office and allow 72 hours.    Masks are optional in the cancer centers.  If you would like for your care team to wear a mask while they are taking care of you, please let them know. You may have one support person who is at least 79 years old accompany you for your appointments.

## 2022-02-12 ENCOUNTER — Other Ambulatory Visit: Payer: Self-pay

## 2022-02-12 NOTE — Progress Notes (Addendum)
Patient called inquiring about vitamins prior to surgery. Patient instructed to stop all vitamins and supplements now (Fri 02/12/22) until after surgery.   Reviewed how to use CHG soap the night prior to surgery and on DOS.  Reviewed pre-surgery drink instructions and informed drink to be completed by 0430 am on DOS.  Nothing to drink after the pre-surgery drink.

## 2022-02-15 ENCOUNTER — Inpatient Hospital Stay (HOSPITAL_COMMUNITY): Payer: Medicare Other | Admitting: Physician Assistant

## 2022-02-15 ENCOUNTER — Encounter (HOSPITAL_COMMUNITY): Payer: Self-pay | Admitting: Surgery

## 2022-02-15 ENCOUNTER — Other Ambulatory Visit: Payer: Self-pay

## 2022-02-15 ENCOUNTER — Inpatient Hospital Stay (HOSPITAL_COMMUNITY)
Admission: RE | Admit: 2022-02-15 | Discharge: 2022-02-19 | DRG: 415 | Disposition: A | Discharge status: Home / Self Care | Payer: Medicare Other | Attending: Surgery | Admitting: Surgery

## 2022-02-15 ENCOUNTER — Encounter (HOSPITAL_COMMUNITY)
Admission: RE | Disposition: A | Discharge status: Home / Self Care | Payer: Self-pay | Source: Home / Self Care | Attending: Surgery

## 2022-02-15 DIAGNOSIS — Z86711 Personal history of pulmonary embolism: Secondary | ICD-10-CM | POA: Diagnosis not present

## 2022-02-15 DIAGNOSIS — Z91048 Other nonmedicinal substance allergy status: Secondary | ICD-10-CM | POA: Diagnosis not present

## 2022-02-15 DIAGNOSIS — D630 Anemia in neoplastic disease: Secondary | ICD-10-CM

## 2022-02-15 DIAGNOSIS — F419 Anxiety disorder, unspecified: Secondary | ICD-10-CM | POA: Diagnosis present

## 2022-02-15 DIAGNOSIS — K219 Gastro-esophageal reflux disease without esophagitis: Secondary | ICD-10-CM | POA: Diagnosis present

## 2022-02-15 DIAGNOSIS — K9171 Accidental puncture and laceration of a digestive system organ or structure during a digestive system procedure: Secondary | ICD-10-CM | POA: Diagnosis not present

## 2022-02-15 DIAGNOSIS — F418 Other specified anxiety disorders: Secondary | ICD-10-CM | POA: Diagnosis not present

## 2022-02-15 DIAGNOSIS — C259 Malignant neoplasm of pancreas, unspecified: Secondary | ICD-10-CM | POA: Diagnosis present

## 2022-02-15 DIAGNOSIS — F32A Depression, unspecified: Secondary | ICD-10-CM | POA: Diagnosis present

## 2022-02-15 DIAGNOSIS — Z981 Arthrodesis status: Secondary | ICD-10-CM

## 2022-02-15 DIAGNOSIS — Z79899 Other long term (current) drug therapy: Secondary | ICD-10-CM

## 2022-02-15 DIAGNOSIS — C250 Malignant neoplasm of head of pancreas: Secondary | ICD-10-CM | POA: Diagnosis present

## 2022-02-15 DIAGNOSIS — Y838 Other surgical procedures as the cause of abnormal reaction of the patient, or of later complication, without mention of misadventure at the time of the procedure: Secondary | ICD-10-CM | POA: Diagnosis not present

## 2022-02-15 DIAGNOSIS — I10 Essential (primary) hypertension: Secondary | ICD-10-CM | POA: Diagnosis present

## 2022-02-15 DIAGNOSIS — Z9221 Personal history of antineoplastic chemotherapy: Secondary | ICD-10-CM

## 2022-02-15 DIAGNOSIS — E785 Hyperlipidemia, unspecified: Secondary | ICD-10-CM | POA: Diagnosis present

## 2022-02-15 HISTORY — PX: LAPAROSCOPY: SHX197

## 2022-02-15 HISTORY — PX: WHIPPLE PROCEDURE: SHX2667

## 2022-02-15 LAB — BASIC METABOLIC PANEL
Anion gap: 8 (ref 5–15)
BUN: 29 mg/dL — ABNORMAL HIGH (ref 8–23)
CO2: 25 mmol/L (ref 22–32)
Calcium: 8.6 mg/dL — ABNORMAL LOW (ref 8.9–10.3)
Chloride: 106 mmol/L (ref 98–111)
Creatinine, Ser: 0.86 mg/dL (ref 0.44–1.00)
GFR, Estimated: >60 mL/min (ref >60)
Glucose, Bld: 85 mg/dL (ref 70–99)
Potassium: 4.1 mmol/L (ref 3.5–5.1)
Sodium: 139 mmol/L (ref 135–145)

## 2022-02-15 LAB — CBC
HCT: 34.3 % — ABNORMAL LOW (ref 36.0–46.0)
Hemoglobin: 11.2 g/dL — ABNORMAL LOW (ref 12.0–15.0)
MCH: 34.3 pg — ABNORMAL HIGH (ref 26.0–34.0)
MCHC: 32.7 g/dL (ref 30.0–36.0)
MCV: 104.9 fL — ABNORMAL HIGH (ref 80.0–100.0)
Platelets: 166 10*3/uL (ref 150–400)
RBC: 3.27 MIL/uL — ABNORMAL LOW (ref 3.87–5.11)
RDW: 13.8 % (ref 11.5–15.5)
WBC: 4.2 10*3/uL (ref 4.0–10.5)
nRBC: 0 % (ref 0.0–0.2)

## 2022-02-15 LAB — PREPARE RBC (CROSSMATCH)

## 2022-02-15 SURGERY — WHIPPLE PROCEDURE
Anesthesia: General

## 2022-02-15 MED ORDER — ONDANSETRON HCL 4 MG/2ML IJ SOLN: 4.0000 mg | Freq: Four times a day (QID) | INTRAMUSCULAR | Status: DC | PRN

## 2022-02-15 MED ORDER — MIRTAZAPINE 30 MG PO TABS
30.0000 mg | ORAL_TABLET | Freq: Every day | ORAL | Status: DC
  Administered 2022-02-15 – 2022-02-18 (×4): 30 mg via ORAL
  Filled 2022-02-15 (×4): qty 1

## 2022-02-15 MED ORDER — BUPROPION HCL ER (XL) 300 MG PO TB24
300.0000 mg | ORAL_TABLET | Freq: Every day | ORAL | Status: DC
  Administered 2022-02-16 – 2022-02-19 (×4): 300 mg via ORAL
  Filled 2022-02-15 (×5): qty 1

## 2022-02-15 MED ORDER — DEXAMETHASONE SODIUM PHOSPHATE 10 MG/ML IJ SOLN
INTRAMUSCULAR | Status: DC | PRN
  Administered 2022-02-15: 5 mg via INTRAVENOUS

## 2022-02-15 MED ORDER — FENTANYL CITRATE (PF) 250 MCG/5ML IJ SOLN
INTRAMUSCULAR | Status: AC
  Filled 2022-02-15: qty 5

## 2022-02-15 MED ORDER — DIPHENHYDRAMINE HCL 50 MG/ML IJ SOLN: 12.5000 mg | Freq: Four times a day (QID) | INTRAMUSCULAR | Status: DC | PRN

## 2022-02-15 MED ORDER — METHOCARBAMOL 500 MG PO TABS
500.0000 mg | ORAL_TABLET | Freq: Three times a day (TID) | ORAL | Status: DC
  Administered 2022-02-15 – 2022-02-18 (×11): 500 mg via ORAL
  Filled 2022-02-15 (×11): qty 1

## 2022-02-15 MED ORDER — PIPERACILLIN-TAZOBACTAM 3.375 G IVPB 30 MIN
3.3750 g | INTRAVENOUS | Status: AC
  Administered 2022-02-15: 3.375 g via INTRAVENOUS
  Filled 2022-02-15: qty 50

## 2022-02-15 MED ORDER — OXYCODONE HCL 5 MG PO TABS
5.0000 mg | ORAL_TABLET | ORAL | Status: DC | PRN
  Administered 2022-02-16: 10 mg via ORAL
  Administered 2022-02-17: 5 mg via ORAL
  Administered 2022-02-17: 10 mg via ORAL
  Administered 2022-02-18 (×2): 5 mg via ORAL
  Filled 2022-02-15: qty 2
  Filled 2022-02-15 (×3): qty 1
  Filled 2022-02-15 (×3): qty 2

## 2022-02-15 MED ORDER — ACETAMINOPHEN 500 MG PO TABS
1000.0000 mg | ORAL_TABLET | Freq: Once | ORAL | Status: AC
  Administered 2022-02-15: 1000 mg via ORAL
  Filled 2022-02-15: qty 2

## 2022-02-15 MED ORDER — PROPOFOL 500 MG/50ML IV EMUL
INTRAVENOUS | Status: DC | PRN
  Administered 2022-02-15: 20 ug/kg/min via INTRAVENOUS

## 2022-02-15 MED ORDER — LIDOCAINE 2% (20 MG/ML) 5 ML SYRINGE
INTRAMUSCULAR | Status: DC | PRN
  Administered 2022-02-15: 40 mg via INTRAVENOUS

## 2022-02-15 MED ORDER — ONDANSETRON HCL 4 MG/2ML IJ SOLN
INTRAMUSCULAR | Status: DC | PRN
  Administered 2022-02-15: 4 mg via INTRAVENOUS

## 2022-02-15 MED ORDER — FENTANYL CITRATE (PF) 100 MCG/2ML IJ SOLN
INTRAMUSCULAR | Status: AC
  Filled 2022-02-15: qty 2

## 2022-02-15 MED ORDER — POLYETHYL GLYCOL-PROPYL GLYCOL 0.4-0.3 % OP GEL
Freq: Every day | OPHTHALMIC | Status: DC
  Filled 2022-02-15: qty 10

## 2022-02-15 MED ORDER — 0.9 % SODIUM CHLORIDE (POUR BTL) OPTIME
TOPICAL | Status: DC | PRN
  Administered 2022-02-15 (×4): 1000 mL

## 2022-02-15 MED ORDER — FENTANYL CITRATE (PF) 100 MCG/2ML IJ SOLN
25.0000 ug | INTRAMUSCULAR | Status: DC | PRN
  Administered 2022-02-15 (×3): 50 ug via INTRAVENOUS

## 2022-02-15 MED ORDER — FENTANYL CITRATE (PF) 250 MCG/5ML IJ SOLN
INTRAMUSCULAR | Status: DC | PRN
  Administered 2022-02-15: 100 ug via INTRAVENOUS
  Administered 2022-02-15: 50 ug via INTRAVENOUS
  Administered 2022-02-15: 100 ug via INTRAVENOUS

## 2022-02-15 MED ORDER — ACETAMINOPHEN 500 MG PO TABS
1000.0000 mg | ORAL_TABLET | Freq: Three times a day (TID) | ORAL | Status: DC
  Administered 2022-02-15 – 2022-02-18 (×11): 1000 mg via ORAL
  Filled 2022-02-15 (×11): qty 2

## 2022-02-15 MED ORDER — QUETIAPINE FUMARATE 200 MG PO TABS
200.0000 mg | ORAL_TABLET | Freq: Every day | ORAL | Status: DC
  Administered 2022-02-15 – 2022-02-18 (×4): 200 mg via ORAL
  Filled 2022-02-15 (×4): qty 1

## 2022-02-15 MED ORDER — PROPOFOL 10 MG/ML IV BOLUS
INTRAVENOUS | Status: AC
  Filled 2022-02-15: qty 20

## 2022-02-15 MED ORDER — DIPHENHYDRAMINE HCL 12.5 MG/5ML PO ELIX: 12.5000 mg | ORAL_SOLUTION | Freq: Four times a day (QID) | ORAL | Status: DC | PRN

## 2022-02-15 MED ORDER — PHENYLEPHRINE HCL-NACL 20-0.9 MG/250ML-% IV SOLN
INTRAVENOUS | Status: DC | PRN
  Administered 2022-02-15: 30 ug/min via INTRAVENOUS

## 2022-02-15 MED ORDER — ENOXAPARIN SODIUM 40 MG/0.4ML IJ SOSY
40.0000 mg | PREFILLED_SYRINGE | INTRAMUSCULAR | Status: DC
  Administered 2022-02-16 – 2022-02-18 (×3): 40 mg via SUBCUTANEOUS
  Filled 2022-02-15 (×4): qty 0.4

## 2022-02-15 MED ORDER — CHLORHEXIDINE GLUCONATE 0.12 % MT SOLN
15.0000 mL | Freq: Once | OROMUCOSAL | Status: AC
  Administered 2022-02-15: 15 mL via OROMUCOSAL
  Filled 2022-02-15: qty 15

## 2022-02-15 MED ORDER — LACTATED RINGERS IV SOLN: INTRAVENOUS | Status: DC

## 2022-02-15 MED ORDER — KETAMINE HCL 10 MG/ML IJ SOLN
INTRAMUSCULAR | Status: DC | PRN
  Administered 2022-02-15: 25 mg via INTRAVENOUS
  Administered 2022-02-15: 5 mg via INTRAVENOUS
  Administered 2022-02-15 (×2): 10 mg via INTRAVENOUS

## 2022-02-15 MED ORDER — LACTATED RINGERS IV SOLN: INTRAVENOUS | Status: DC | PRN

## 2022-02-15 MED ORDER — ORAL CARE MOUTH RINSE: 15.0000 mL | Freq: Once | OROMUCOSAL | Status: AC

## 2022-02-15 MED ORDER — HEMOSTATIC AGENTS (NO CHARGE) OPTIME
TOPICAL | Status: DC | PRN
  Administered 2022-02-15 (×2): 1 via TOPICAL

## 2022-02-15 MED ORDER — ONDANSETRON 4 MG PO TBDP: 4.0000 mg | ORAL_TABLET | Freq: Four times a day (QID) | ORAL | Status: DC | PRN

## 2022-02-15 MED ORDER — QUETIAPINE FUMARATE 25 MG PO TABS
25.0000 mg | ORAL_TABLET | Freq: Three times a day (TID) | ORAL | Status: DC
  Administered 2022-02-15 – 2022-02-19 (×12): 25 mg via ORAL
  Filled 2022-02-15 (×15): qty 1

## 2022-02-15 MED ORDER — BUSPIRONE HCL 10 MG PO TABS
20.0000 mg | ORAL_TABLET | Freq: Two times a day (BID) | ORAL | Status: DC
  Administered 2022-02-15 – 2022-02-18 (×7): 20 mg via ORAL
  Filled 2022-02-15 (×8): qty 2

## 2022-02-15 MED ORDER — CHLORHEXIDINE GLUCONATE CLOTH 2 % EX PADS: 6.0000 | MEDICATED_PAD | Freq: Once | CUTANEOUS | Status: DC

## 2022-02-15 MED ORDER — PANTOPRAZOLE SODIUM 40 MG PO TBEC
40.0000 mg | DELAYED_RELEASE_TABLET | Freq: Every day | ORAL | Status: DC
  Administered 2022-02-16 – 2022-02-18 (×3): 40 mg via ORAL
  Filled 2022-02-15 (×3): qty 1

## 2022-02-15 MED ORDER — DOCUSATE SODIUM 100 MG PO CAPS
100.0000 mg | ORAL_CAPSULE | Freq: Two times a day (BID) | ORAL | Status: DC
  Administered 2022-02-15 – 2022-02-18 (×8): 100 mg via ORAL
  Filled 2022-02-15 (×8): qty 1

## 2022-02-15 MED ORDER — ARTIFICIAL TEARS OPHTHALMIC OINT
TOPICAL_OINTMENT | Freq: Every day | OPHTHALMIC | Status: DC
  Filled 2022-02-15: qty 3.5

## 2022-02-15 MED ORDER — SUGAMMADEX SODIUM 200 MG/2ML IV SOLN
INTRAVENOUS | Status: DC | PRN
  Administered 2022-02-15: 100 mg via INTRAVENOUS

## 2022-02-15 MED ORDER — ROCURONIUM BROMIDE 10 MG/ML (PF) SYRINGE
PREFILLED_SYRINGE | INTRAVENOUS | Status: DC | PRN
  Administered 2022-02-15: 30 mg via INTRAVENOUS
  Administered 2022-02-15: 50 mg via INTRAVENOUS

## 2022-02-15 MED ORDER — ENSURE PRE-SURGERY PO LIQD: 296.0000 mL | Freq: Once | ORAL | Status: DC

## 2022-02-15 MED ORDER — KETAMINE HCL 50 MG/5ML IJ SOSY
PREFILLED_SYRINGE | INTRAMUSCULAR | Status: AC
  Filled 2022-02-15: qty 5

## 2022-02-15 MED ORDER — METOPROLOL SUCCINATE ER 25 MG PO TB24
25.0000 mg | ORAL_TABLET | Freq: Every day | ORAL | Status: DC
  Administered 2022-02-16 – 2022-02-19 (×4): 25 mg via ORAL
  Filled 2022-02-15 (×4): qty 1

## 2022-02-15 MED ORDER — HYDROMORPHONE HCL 1 MG/ML IJ SOLN
0.5000 mg | INTRAMUSCULAR | Status: DC | PRN
  Administered 2022-02-15 – 2022-02-17 (×6): 0.5 mg via INTRAVENOUS
  Filled 2022-02-15 (×6): qty 0.5

## 2022-02-15 MED ORDER — ENSURE PRE-SURGERY PO LIQD: 592.0000 mL | Freq: Once | ORAL | Status: DC

## 2022-02-15 MED ORDER — PROPOFOL 10 MG/ML IV BOLUS
INTRAVENOUS | Status: DC | PRN
  Administered 2022-02-15: 70 mg via INTRAVENOUS

## 2022-02-15 MED ORDER — ESCITALOPRAM OXALATE 20 MG PO TABS
20.0000 mg | ORAL_TABLET | Freq: Every day | ORAL | Status: DC
  Administered 2022-02-15 – 2022-02-18 (×4): 20 mg via ORAL
  Filled 2022-02-15 (×4): qty 1

## 2022-02-15 SURGICAL SUPPLY — 118 items
BAG BILE T-TUBES STRL (MISCELLANEOUS) IMPLANT
BAG COUNTER SPONGE SURGICOUNT (BAG) ×1 IMPLANT
BAG DRAINAGE 600ML DEPOT (BAG) IMPLANT
BIOPATCH RED 1 DISK 7.0 (GAUZE/BANDAGES/DRESSINGS) ×1 IMPLANT
BLADE CLIPPER SURG (BLADE) IMPLANT
BOOT SUTURE AID YELLOW STND (SUTURE) ×2 IMPLANT
CANISTER SUCT 3000ML PPV (MISCELLANEOUS) ×1 IMPLANT
CHLORAPREP W/TINT 26 (MISCELLANEOUS) ×1 IMPLANT
CLIP TI LARGE 6 (CLIP) ×1 IMPLANT
CLIP TI MEDIUM 24 (CLIP) ×1 IMPLANT
CLIP TI WIDE RED SMALL 24 (CLIP) ×1 IMPLANT
CNTNR URN SCR LID CUP LEK RST (MISCELLANEOUS) IMPLANT
CONT SPEC 4OZ STRL OR WHT (MISCELLANEOUS)
COUNTER NEEDLE 20 DBL MAG RED (NEEDLE) IMPLANT
COVER MAYO STAND STRL (DRAPES) IMPLANT
COVER PROBE W GEL 5X96 (DRAPES) ×1 IMPLANT
COVER SURGICAL LIGHT HANDLE (MISCELLANEOUS) ×1 IMPLANT
DERMABOND ADVANCED .7 DNX12 (GAUZE/BANDAGES/DRESSINGS) ×2 IMPLANT
DRAIN CHANNEL 19F RND (DRAIN) ×1 IMPLANT
DRAIN PENROSE 0.5X18 (DRAIN) ×1 IMPLANT
DRAPE INCISE IOBAN 66X45 STRL (DRAPES) ×1 IMPLANT
DRAPE LAPAROSCOPIC ABDOMINAL (DRAPES) ×1 IMPLANT
DRAPE WARM FLUID 44X44 (DRAPES) ×1 IMPLANT
DRSG COVADERM PLUS 2X2 (GAUZE/BANDAGES/DRESSINGS) IMPLANT
DRSG TEGADERM 4X4.75 (GAUZE/BANDAGES/DRESSINGS) ×1 IMPLANT
DRSG TELFA 3X8 NADH STRL (GAUZE/BANDAGES/DRESSINGS) IMPLANT
ELECT BLADE 4.0 EZ CLEAN MEGAD (MISCELLANEOUS) ×1 IMPLANT
ELECT BLADE 6.5 EXT (BLADE) ×1 IMPLANT
ELECT CAUTERY BLADE 6.4 (BLADE) ×1 IMPLANT
ELECT NDL BLADE 2-5/6 (NEEDLE) ×1 IMPLANT
ELECT NEEDLE BLADE 2-5/6 (NEEDLE) ×1 IMPLANT
ELECT PAD DSPR THERM+ ADLT (MISCELLANEOUS) ×1 IMPLANT
ELECT REM PT RETURN 9FT ADLT (ELECTROSURGICAL) ×1 IMPLANT
ELECTRODE BLDE 4.0 EZ CLN MEGD (MISCELLANEOUS) ×1 IMPLANT
ELECTRODE REM PT RTRN 9FT ADLT (ELECTROSURGICAL) ×1 IMPLANT
EVACUATOR SILICONE 100CC (DRAIN) IMPLANT
GAUZE 4X4 16PLY ~~LOC~~+RFID DBL (SPONGE) IMPLANT
GAUZE SPONGE 4X4 12PLY STRL (GAUZE/BANDAGES/DRESSINGS) IMPLANT
GEL ULTRASOUND 20GR AQUASONIC (MISCELLANEOUS) IMPLANT
GLOVE SURG POLY MICRO LF SZ5.5 (GLOVE) ×1 IMPLANT
GLOVE SURG SYN 5.5 (GLOVE) ×2 IMPLANT
GLOVE SURG SYN 5.5 PF PI (GLOVE) ×2 IMPLANT
GLOVE SURG UNDER POLY LF SZ6 (GLOVE) ×1 IMPLANT
GOWN STRL REUS W/ TWL LRG LVL3 (GOWN DISPOSABLE) ×5 IMPLANT
GOWN STRL REUS W/TWL LRG LVL3 (GOWN DISPOSABLE) ×5
HAND PENCIL TRP OPTION (MISCELLANEOUS) ×1 IMPLANT
HANDLE SUCTION POOLE (INSTRUMENTS) ×1 IMPLANT
HEMOSTAT SNOW SURGICEL 2X4 (HEMOSTASIS) IMPLANT
HEMOSTAT SURGICEL 2X14 (HEMOSTASIS) IMPLANT
KIT BASIN OR (CUSTOM PROCEDURE TRAY) ×1 IMPLANT
KIT MARKER MARGIN INK (KITS) IMPLANT
KIT TUBE JEJUNAL 16FR (CATHETERS) IMPLANT
KIT TURNOVER KIT B (KITS) ×1 IMPLANT
L-HOOK LAP DISP 36CM (ELECTROSURGICAL) IMPLANT
LHOOK LAP DISP 36CM (ELECTROSURGICAL) IMPLANT
LOOP VESSEL MAXI BLUE (MISCELLANEOUS) ×1 IMPLANT
LOOP VESSEL MINI RED (MISCELLANEOUS) ×1 IMPLANT
MARKER SKIN DUAL TIP RULER LAB (MISCELLANEOUS) ×1 IMPLANT
NDL INSUFFLATION 14GA 120MM (NEEDLE) ×1 IMPLANT
NEEDLE INSUFFLATION 14GA 120MM (NEEDLE) ×1 IMPLANT
NS IRRIG 1000ML POUR BTL (IV SOLUTION) ×2 IMPLANT
PACK GENERAL/GYN (CUSTOM PROCEDURE TRAY) IMPLANT
PAD ARMBOARD 7.5X6 YLW CONV (MISCELLANEOUS) ×2 IMPLANT
PENCIL SMOKE EVACUATOR (MISCELLANEOUS) ×1 IMPLANT
RELOAD PROXIMATE 75MM BLUE (ENDOMECHANICALS) IMPLANT
RELOAD PROXIMATE 75MM GREEN (ENDOMECHANICALS) IMPLANT
RELOAD STAPLE 75 3.8 BLU REG (ENDOMECHANICALS) ×1 IMPLANT
RELOAD STAPLE 75 4.5 GRN THCK (ENDOMECHANICALS) IMPLANT
RETRACTOR WND ALEXIS 25 LRG (MISCELLANEOUS) IMPLANT
RETRACTOR WOUND ALXS 34CM XLRG (MISCELLANEOUS) IMPLANT
RTRCTR WOUND ALEXIS 25CM LRG (MISCELLANEOUS) IMPLANT
RTRCTR WOUND ALEXIS 34CM XLRG (MISCELLANEOUS) ×1 IMPLANT
SCISSORS LAP 5X35 DISP (ENDOMECHANICALS) IMPLANT
SET IRRIG TUBING LAPAROSCOPIC (IRRIGATION / IRRIGATOR) IMPLANT
SET TUBE SMOKE EVAC HIGH FLOW (TUBING) ×1 IMPLANT
SHEARS FOC LG CVD HARMONIC 17C (MISCELLANEOUS) IMPLANT
SHEARS HARMONIC 23CM COAG (MISCELLANEOUS) ×1 IMPLANT
SLEEVE Z-THREAD 5X100MM (TROCAR) IMPLANT
SPONGE INTESTINAL PEANUT (DISPOSABLE) IMPLANT
SPONGE SURGIFOAM ABS GEL 100 (HEMOSTASIS) IMPLANT
SPONGE T-LAP 18X18 ~~LOC~~+RFID (SPONGE) ×2 IMPLANT
STAPLER PROXIMATE 75MM BLUE (STAPLE) ×1 IMPLANT
STAPLER VISISTAT 35W (STAPLE) IMPLANT
SUCTION POOLE HANDLE (INSTRUMENTS) ×1 IMPLANT
SUT ETHILON 2 0 FS 18 (SUTURE) ×1 IMPLANT
SUT ETHILON 2 LR (SUTURE) IMPLANT
SUT MNCRL AB 4-0 PS2 18 (SUTURE) ×1 IMPLANT
SUT PDS AB 1 TP1 96 (SUTURE) ×2 IMPLANT
SUT PDS AB 3-0 SH 27 (SUTURE) IMPLANT
SUT PDS AB 4-0 RB1 27 (SUTURE) ×6 IMPLANT
SUT PDS II 5-0 RB-2 VIOLET (SUTURE) ×6 IMPLANT
SUT PROLENE 3 0 SH 48 (SUTURE) ×1 IMPLANT
SUT PROLENE 4 0 RB 1 (SUTURE) ×2
SUT PROLENE 4-0 RB1 .5 CRCL 36 (SUTURE) ×2 IMPLANT
SUT SILK 2 0 TIES 10X30 (SUTURE) ×1 IMPLANT
SUT SILK 2 0SH CR/8 30 (SUTURE) ×1 IMPLANT
SUT SILK 3 0 TIES 10X30 (SUTURE) ×1 IMPLANT
SUT SILK 3 0SH CR/8 30 (SUTURE) ×1 IMPLANT
SUT VIC AB 2-0 CT1 27 (SUTURE)
SUT VIC AB 2-0 CT1 TAPERPNT 27 (SUTURE) IMPLANT
SUT VIC AB 2-0 SH 18 (SUTURE) IMPLANT
SUT VIC AB 3-0 MH 27 (SUTURE) ×2 IMPLANT
SUT VIC AB 3-0 SH 18 (SUTURE) ×1 IMPLANT
SUT VIC AB 3-0 SH 27 (SUTURE) ×2
SUT VIC AB 3-0 SH 27X BRD (SUTURE) ×2 IMPLANT
SUT VIC AB 3-0 SH 8-18 (SUTURE) ×1 IMPLANT
SUT VICRYL AB 2 0 TIES (SUTURE) IMPLANT
SYR BULB IRRIG 60ML STRL (SYRINGE) IMPLANT
TAPE UMBILICAL 1/8 X36 TWILL (MISCELLANEOUS) IMPLANT
TOWEL GREEN STERILE (TOWEL DISPOSABLE) ×1 IMPLANT
TOWEL GREEN STERILE FF (TOWEL DISPOSABLE) ×1 IMPLANT
TRAY FOLEY MTR SLVR 14FR STAT (SET/KITS/TRAYS/PACK) ×1 IMPLANT
TRAY LAPAROSCOPIC MC (CUSTOM PROCEDURE TRAY) ×1 IMPLANT
TROCAR XCEL BLUNT TIP 100MML (ENDOMECHANICALS) IMPLANT
TROCAR Z-THREAD OPTICAL 5X100M (TROCAR) ×1 IMPLANT
TUBE FEEDING 8FR 16IN STR KANG (MISCELLANEOUS) IMPLANT
TUBE FEEDING ENTERAL 5FR 16IN (TUBING) IMPLANT
WARMER LAPAROSCOPE (MISCELLANEOUS) ×1 IMPLANT

## 2022-02-15 NOTE — Progress Notes (Signed)
Patient set on automatic trays

## 2022-02-15 NOTE — Anesthesia Procedure Notes (Addendum)
Arterial Line Insertion Start/End1/22/2024 7:18 AM, 02/15/2022 7:20 AM Performed by: Darral Dash, DO, anesthesiologist  Patient location: Pre-op. Preanesthetic checklist: patient identified, IV checked, site marked, risks and benefits discussed, surgical consent, monitors and equipment checked, pre-op evaluation, timeout performed and anesthesia consent Lidocaine 1% used for infiltration Right, radial was placed Catheter size: 20 G Hand hygiene performed  and maximum sterile barriers used   Attempts: 1 Procedure performed using ultrasound guided technique. Ultrasound Notes:anatomy identified, needle tip was noted to be adjacent to the nerve/plexus identified and no ultrasound evidence of intravascular and/or intraneural injection Following insertion, dressing applied and Biopatch. Post procedure assessment: normal and unchanged  Post procedure complications: unsuccessful attempts and second provider assisted. Patient tolerated the procedure well with no immediate complications.

## 2022-02-15 NOTE — Progress Notes (Signed)
Patient arrived to [facility] room 6 alert and oriented. Pain level 4/10/ foley in place. JP to charge position. Midline clean dry and ointact. Family at bedside. Bed in lowest position . Call light in reach will continue to monitor pt.

## 2022-02-15 NOTE — H&P (Signed)
Sheryl Bender is an 77 y.o. female.   Chief Complaint: pancreatic cancer HPI: Sheryl Bender is a 77 y.o. female with pancreatic adenocarcinoma. Briefly, she had this diagnosed in June of 2023 on EUS, which showed a stricture in the genu of the pancreatic duct but no discrete mass (stage uT1N0). She subsequently developed acute pancreatitis with a large pseudocyst. She saw me initially in consultation in July. 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 Rush Landmark. The stents were removed on 9/14. She has been undergoing systemic treatment with modified FOLFIRINOX and recently completed cycle 8 on 01/12/22. She had new restaging scans on 1/15 that showed no evidence of disease progression or metastatic disease. CA19-9 remains normal at 18, but was never elevated. She is here today for surgery.   Past Medical History:  Diagnosis Date   Anxiety    Arthritis    Depression    Dysrhythmia    hx palpitations greater than 5 yrs -neg echo, stress per pt ? where or dr   GERD (gastroesophageal reflux disease)    occ   Nausea & vomiting 08/24/2021   Pancreatic adenocarcinoma ([place])    Pancreatic pseudocyst    Port-A-Cath in place 09/15/2021   Pulmonary embolism [facility])    September 2022    Past Surgical History:  Procedure Laterality Date   ANTERIOR LAT LUMBAR FUSION Right 12/16/2015   Procedure: RIGHT LUMBAR TWO-THREE, LUMBAR THREE-FOUR, LUMBAR FOUR-FIVE ANTEROLATERAL LUMBAR INTERBODY FUSION;  Surgeon: Erline Levine, MD;  Location: [place];  Service: Neurosurgery;  Laterality: Right;   BALLOON DILATION N/A 08/27/2021   Procedure: BALLOON DILATION;  Surgeon: Rush Landmark Telford Nab., MD;  Location: [facility];  Service: Gastroenterology;  Laterality: N/A;   BIOPSY  07/09/2021   Procedure: BIOPSY;  Surgeon: Rush Landmark Telford Nab., MD;  Location: [place];   Service: Gastroenterology;;   CYST GASTROSTOMY  08/27/2021   Procedure: CYST GASTROSTOMY;  Surgeon: Irving Copas., MD;  [facility]: [facility];  Service: Gastroenterology;;   ESOPHAGOGASTRODUODENOSCOPY N/A 07/09/2021   Procedure: ESOPHAGOGASTRODUODENOSCOPY (EGD);  Surgeon: Irving Copas., MD;  Location: [place];  Service: Gastroenterology;  Laterality: N/A;   ESOPHAGOGASTRODUODENOSCOPY (EGD) WITH PROPOFOL N/A 08/27/2021   Procedure: ESOPHAGOGASTRODUODENOSCOPY (EGD) WITH PROPOFOL;  Surgeon: Rush Landmark Telford Nab., MD;  [facility];  Service: Gastroenterology;  Laterality: N/A;   ESOPHAGOGASTRODUODENOSCOPY (EGD) WITH PROPOFOL N/A 10/08/2021   Procedure: ESOPHAGOGASTRODUODENOSCOPY (EGD) WITH PROPOFOL;  Surgeon: Rush Landmark Telford Nab., MD;  [facility];  Service: Gastroenterology;  Laterality: N/A;   EUS N/A 07/09/2021   Procedure: UPPER ENDOSCOPIC ULTRASOUND (EUS) RADIAL;  Surgeon: Irving Copas., MD;  Location: [place];  Service: Gastroenterology;  Laterality: N/A;   EUS N/A 08/27/2021   Procedure: UPPER ENDOSCOPIC ULTRASOUND (EUS) LINEAR;  Surgeon: Irving Copas., MD;  [facility];  Service: Gastroenterology;  Laterality: N/A;   FINE NEEDLE ASPIRATION  07/09/2021   Procedure: FINE NEEDLE ASPIRATION (FNA) LINEAR;  Surgeon: Irving Copas., MD;  Location: [place];  Service: Gastroenterology;;   IR IMAGING GUIDED PORT INSERTION  09/14/2021   LUMBAR PERCUTANEOUS PEDICLE SCREW 3 LEVEL Bilateral 12/16/2015   Procedure: PERCUTANEOUS PEDICLE SCREWS BILATERALLY AT LUMBAR TWO-FIVE;  Surgeon: Erline Levine, MD;  Location: [place];  Service: Neurosurgery;  Laterality: Bilateral;   PANCREATIC STENT PLACEMENT  08/27/2021   Procedure: PANCREATIC STENT PLACEMENT;  Surgeon: Rush Landmark Telford Nab., MD;  [facility];  Service:  Gastroenterology;;   STENT REMOVAL  10/08/2021   Procedure: STENT REMOVAL;  Surgeon: Irving Copas., MD;  Location: [facility];  Service: Gastroenterology;;   TUBAL LIGATION  1974    Family History  Problem Relation Age of Onset   Pancreatitis Brother        alcoholic pancreatitis   Pancreatic cancer Neg Hx    Colon cancer Neg Hx    Social History:  reports that she has never smoked. She has never used smokeless tobacco. She reports that she does not drink alcohol and does not use drugs.  Allergies:  Allergies  Allergen Reactions   Other Hives and Other (See Comments)    Cherry wood just cut- "smelled it and broke out"; cannot tolerate ANY cherry fragrances, either      Cherry Hives   Wound Dressing Adhesive Rash and Other (See Comments)    Band-Aids = local reaction    Medications Prior to Admission  Medication Sig Dispense Refill   acetaminophen (TYLENOL) 500 MG tablet Take 2 tablets (1,000 mg total) by mouth every 8 (eight) hours as needed for moderate pain, headache or fever. (Patient taking differently: Take 750-1,000 mg by mouth every 8 (eight) hours as needed for moderate pain, headache or fever.)     Aromatic Inhalants (VICKS VAPOINHALER) INHA Inhale 1 puff into the lungs daily as needed (congestion).     buPROPion (WELLBUTRIN XL) 300 MG 24 hr tablet Take 300 mg by mouth daily after breakfast.     busPIRone (BUSPAR) 10 MG tablet Take 20 mg by mouth 2 (two) times daily.     cephALEXin (KEFLEX) 500 MG capsule Take 500 mg by mouth 2 (two) times daily.     Cholecalciferol (VITAMIN D3) 125 MCG (5000 UT) CAPS Take 5,000 Units by mouth every other day.     escitalopram (LEXAPRO) 20 MG tablet Take 20 mg by mouth at bedtime.     lactose free nutrition (BOOST) LIQD Take 237 mLs by mouth 2 (two) times daily between meals.     lidocaine-prilocaine (EMLA) cream Apply a small amount to port a cath site (do not rub in) and cover with plastic wrap 1 hour prior to infusion appointments 30 g 3   linaclotide (LINZESS) 72 MCG capsule Take 1 capsule (72 mcg total) by mouth  daily before breakfast. 30 capsule 12   metoprolol succinate (TOPROL-XL) 25 MG 24 hr tablet Take 25 mg by mouth daily after breakfast.     mirtazapine (REMERON) 30 MG tablet Take 30 mg by mouth at bedtime.     neomycin-bacitracin-polymyxin (NEOSPORIN) OINT Apply 1 Application topically as needed for wound care.     omeprazole (PRILOSEC) 40 MG capsule Take 1 capsule (40 mg total) by mouth daily. 30 capsule 12   ondansetron (ZOFRAN-ODT) 8 MG disintegrating tablet Take 1 tablet (8 mg total) by mouth every 8 (eight) hours as needed for nausea or vomiting. 30 tablet 0   Polyethyl Glycol-Propyl Glycol (SYSTANE OP) Place 1 drop into both eyes at bedtime.     QUEtiapine (SEROQUEL) 200 MG tablet Take 200 mg by mouth at bedtime.     QUEtiapine (SEROQUEL) 25 MG tablet Take 25 mg by mouth 3 (three) times daily after meals.     vitamin B-12 (CYANOCOBALAMIN) 500 MCG tablet Take 1 tablet (500 mcg total) by mouth daily. 30 tablet 2   alum & mag hydroxide-simeth (MAALOX/MYLANTA) 200-200-20 MG/5ML suspension Take 30 mLs by mouth every 4 (four) hours as needed for indigestion. [place]  mL 0   calcium carbonate (TUMS - DOSED IN MG ELEMENTAL CALCIUM) 500 MG chewable tablet Chew 1 tablet (200 mg of elemental calcium total) by mouth 2 (two) times daily as needed for indigestion or heartburn. (Patient taking differently: Chew 1-2 tablets by mouth 2 (two) times daily as needed for indigestion or heartburn.)     fluorouracil CALGB 32951 1,920 mg/m2 in sodium chloride 0.9 % 150 mL Inject 1,920 mg/m2 into the vein over 48 hr.     lactulose (CHRONULAC) 10 GM/15ML solution Take 15 mLs (10 g total) by mouth at bedtime. Take 15 ml at bedtime every night to assist with bowel movements. If bowel movement has not occurred in 3-4 days take 15 ml every 3 hours until bowel movement has occurred. 236 mL 0   LEUCOVORIN CALCIUM IV Inject into the vein every 14 (fourteen) days.     OXALIPLATIN IV Inject into the vein every 14 (fourteen) days.      oxyCODONE (OXY IR/ROXICODONE) 5 MG immediate release tablet Take 1 tablet (5 mg total) by mouth every 6 (six) hours as needed for severe pain. 30 tablet 0   prochlorperazine (COMPAZINE) 10 MG tablet Take 1 tablet (10 mg total) by mouth every 6 (six) hours as needed for nausea or vomiting (Nausea or vomiting). 60 tablet 3   sodium chloride (OCEAN) 0.65 % SOLN nasal spray Place 1 spray into both nostrils as needed for congestion.      Results for orders placed or performed during the hospital encounter of 02/15/22 (from the past 48 hour(s))  CBC     Status: Abnormal   Collection Time: 02/15/22  5:54 AM  Result Value Ref Range   WBC 4.2 4.0 - 10.5 K/uL   RBC 3.27 (L) 3.87 - 5.11 MIL/uL   Hemoglobin 11.2 (L) 12.0 - 15.0 g/dL   HCT 34.3 (L) 36.0 - 46.0 %   MCV 104.9 (H) 80.0 - 100.0 fL   MCH 34.3 (H) 26.0 - 34.0 pg   MCHC 32.7 30.0 - 36.0 g/dL   RDW 13.8 11.5 - 15.5 %   Platelets 166 150 - 400 K/uL   nRBC 0.0 0.0 - 0.2 %    Comment: Performed at [facility], [place] [address] [place], [place] [address]  Basic metabolic panel     Status: Abnormal   Collection Time: 02/15/22  5:54 AM  Result Value Ref Range   Sodium 139 135 - 145 mmol/L   Potassium 4.1 3.5 - 5.1 mmol/L    Comment: HEMOLYSIS AT THIS LEVEL MAY AFFECT RESULT   Chloride 106 98 - 111 mmol/L   CO2 25 22 - 32 mmol/L   Glucose, Bld 85 70 - 99 mg/dL    Comment: Glucose reference range applies only to samples taken after fasting for at least 8 hours.   BUN 29 (H) 8 - 23 mg/dL   Creatinine, Ser 0.86 0.44 - 1.00 mg/dL   Calcium 8.6 (L) 8.9 - 10.3 mg/dL   GFR, Estimated >60 >60 mL/min    Comment: (NOTE) Calculated using the CKD-EPI Creatinine Equation (2021)    Anion gap 8 5 - 15    Comment: Performed at [facility] [address], [place], [place] [address]  Type and screen     Status: None (Preliminary result)   Collection Time: 02/15/22  6:01 AM  Result Value Ref Range   ABO/RH(D) A POS    Antibody Screen NEG     Sample Expiration  02/18/2022,2359 Performed at [facility] [address] [place], [place] [address]    Unit Number [id number]    Blood Component Type RED CELLS,LR    Unit division 00    Status of Unit ALLOCATED    Transfusion Status OK TO TRANSFUSE    Crossmatch Result Compatible    Unit Number [id number]    Blood Component Type RBC LR PHER1    Unit division 00    Status of Unit ALLOCATED    Transfusion Status OK TO TRANSFUSE    Crossmatch Result Compatible   Prepare RBC (crossmatch)     Status: None   Collection Time: 02/15/22  6:30 AM  Result Value Ref Range   Order Confirmation      ORDER PROCESSED BY BLOOD BANK Performed at [facility], [place] [address] [place], [place] [address]    No results found.  Review of Systems  Blood pressure 130/66, pulse 72, temperature (!) 97.4 F (36.3 C), temperature source Oral, resp. rate 18, height '5\' 2"'$  (1.575 m), weight 56.7 kg, SpO2 97 %. Physical Exam Vitals reviewed.  Constitutional:      General: She is not in acute distress.    Appearance: Normal appearance.  HENT:     Head: Normocephalic and atraumatic.  Eyes:     General: No scleral icterus.    Conjunctiva/sclera: Conjunctivae normal.  Cardiovascular:     Rate and Rhythm: Normal rate and regular rhythm.  Pulmonary:     Effort: Pulmonary effort is normal. No respiratory distress.  Abdominal:     General: There is no distension.     Palpations: Abdomen is soft.     Tenderness: There is no abdominal tenderness.  Skin:    General: Skin is warm and dry.     Coloration: Skin is not jaundiced.  Neurological:     General: No focal deficit present.     Mental Status: She is alert and oriented to person, place, and time.      Assessment/Plan 77 yo female with adenocarcinoma of the pancreatic head. Proceed to OR for staging laparoscopy and [facility]. Informed consent obtained, benefits and risks have been extensively reviewed with the patient and  her son. Admit to ICU postoperatively.  Dwan Bolt, MD 02/15/2022, 7:22 AM

## 2022-02-15 NOTE — TOC Initial Note (Signed)
Transition of Care [facility]) - Initial/Assessment Note    Patient Details  Name: Sheryl Bender MRN: [id number] Date of Birth: Jan 01, 1946  [facility] CM/SW Contact:    Ninfa Meeker, RN Phone Number: 02/15/2022, 4:39 PM  Clinical Narrative:                 Transition of Care Screening Note:          Patient Goals and CMS Choice            Expected Discharge Plan and Services                                              Prior Living Arrangements/Services                       Activities of Daily Living      Permission Sought/Granted                  Emotional Assessment              Admission diagnosis:  Pancreatic adenocarcinoma ([place]) [C25.9] Pancreatic cancer [facility]) [C25.9] Patient Active Problem List   Diagnosis Date Noted   Pancreatic adenocarcinoma ([place]) 02/15/2022   Pancreatic cancer ([place]) 02/15/2022   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   Genetic testing 09/23/2021   Port-A-Cath in place 09/15/2021   Malnutrition of moderate degree 08/25/2021   Epigastric pain 08/24/2021   Nausea & vomiting 08/24/2021   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   Physical deconditioning    Malignant neoplasm of pancreas ([place])    Iron deficiency anemia 07/15/2021   Essential hypertension 07/15/2021   Constipation    Pancreatitis, acute 07/13/2021   Hypokalemia 07/13/2021   GERD (gastroesophageal reflux disease) 07/13/2021   Depression 07/13/2021   Dilation of pancreatic duct 01/28/2021   Colon cancer screening 01/28/2021   Lumbar scoliosis 12/16/2015   PCP:  Sherrilee Gilles, DO Pharmacy:   [id number] [place], [place] [id number]: [phone]: [phone]    Social Determinants of Health (SDOH) Social History: SDOH Screenings   Tobacco Use: Low Risk  (02/15/2022)   SDOH Interventions:     Readmission Risk Interventions    07/17/2021     1:22 PM  Readmission Risk Prevention Plan  Transportation Screening Complete  Home Care Screening Complete  Medication Review (RN CM) Complete

## 2022-02-15 NOTE — Op Note (Addendum)
Date: 02/15/22  [name] MRN: [id number]  Preoperative Diagnosis: Pancreatic adenocarcinoma Postoperative Diagnosis: Same  Procedure:  Staging laparoscopy Exploratory laparotomy Cholecystectomy  Surgeon: Michaelle Birks, MD Assistant: Johnathan Hausen, MD  EBL: 300 mL  Anesthesia: General endotracheal  Specimens: Gallbladder  Indications: Sheryl Bender is a 77 yo female who was diagnosed with pancreatic adenocarcinoma last year after being incidentally noted to have pancreatic duct dilation on a CT scan. She had an EUS in June of 2023 with a biopsy, which confirmed adenocarcinoma. Following the procedure she developed acute pancreatitis and developed a large pseudocyst, from which she developed gastric outlet obstruction and required endoscopic drainage of the cyst. She recovered and has completed 8 cycles neoadjuvant FOLFIRINOX. After an extensive discussion of the risks and benefits of surgery, she agreed to proceed with resection.  Findings: Changes of prior pancreatitis in the lesser sac, especially along the inferior border of the pancreas and root of the mesentery. There was extensive thick scarring with obliteration of tissue planes, preventing safe dissection of the SMV. Thus resection was aborted. Cholecystectomy was performed. No evidence of metastatic disease within the abdomen.  Procedure details: Informed consent was obtained in the preoperative area prior to the procedure. The patient was brought to the operating room and placed on the table in the supine position. General anesthesia was induced and appropriate lines and drains were placed for intraoperative monitoring. Perioperative antibiotics were administered per SCIP guidelines. The abdomen was prepped and draped in the usual sterile fashion. A pre-procedure timeout was taken verifying patient identity, surgical site and procedure to be performed.  A supraumbilical skin incision was made, and the subcutaneous  tissue was bluntly spread. The umbilical stalk was grasped and elevated, a Veress needle was inserted through the fascia, and intraperitoneal placement was confirmed with the saline drop test. A 1m Visiport was placed and the abdomen was insufflated. The peritoneal cavity was inspected, including the liver, both hemidiaphragms, and the peritoneal surface throughout the abdomen. There was no evidence of metastatic disease. The port was removed and the abdomen was desufflated.  An upper midline skin incision was made starting at the xiphoid process and extending just inferior to the umbilicus. The subcutaneous tissue was divided with cautery and linea alba was opened. The falciform ligament was divided with Harmonic shears and taken down off the abdominal wall. An Allexis wound protector and Bookwalter fixed retractor were placed. The liver was palpated and no nodules or masses were palpable. The duodenum was widely kocherized to expose the IVC. There was evidence of lymphatic obstruction in the retroperitoneum and in the root of the small bowel mesentery. Next the gastrocolic omentum was opened with harmonic shears to enter the lesser sac. The distal stomach was adherent to the anterior surface of the pancreas, consistent with prior pancreatitis. Some of these adhesions were thin and filmy and were taken down with cautery. The pancreas felt diffusely firm. The inferior border of the pancreas was initially difficult to distinguish as there was diffuse, thick inflammatory tissue present, which pulled the transverse mesocolon into this inflammatory process. Multiple attempts were made to create a plane at the inferior border of the pancreas using blunt dissection, lateral to the neck to avoid inadvertent injury to the SMV. This resulted in bleeding from multiple small venous branches, which was oversewn with 4-0 prolene figure-of-eight sutures. This area was gently packed with surgicel snow and gauze.  A  cholecystectomy was performed in dome-down fashion. The cystic artery was ligated with Harmonic  shears, and the cystic duct was ligated with a 2-0 silk tie. The gallbladder was passed off the field and sent for routine pathology. Next the portal dissection was started. The lesser omentum was opened with cautery. There was mildly thickened tissue over the porta, consistent with previous inflammation. The common hepatic artery pulse was palpated. The common bile duct was visualized and circumferentially dissected out just distal to the cystic duct insertion. There was no evidence of a replaced right hepatic artery. The right gastric artery was circumferentially dissected out and ligated with a 3-0 silk suture ligature.   At this point we again attempted to expose the SMV. Multiple attempts were made to create a plane at the inferior border of the pancreas, but this resulted in venous bleeding, which was controlled with a 4-0 prolene figure-of-eight suture. The more distal SMV also could not be dissected out due to extension of the inflammatory tissue towards the root of the mesentery. My partner and I had a discussion at this point and both agreed that we could not safely dissect out the SMV to obtain adequate vascular control, and due to the obliteration of the tissue planes by thick, firm inflammatory tissue, we felt that proceeding further would carry high risk of a major injury to the SMV, which would be extremely difficult to expose and repair. Thus the resection was aborted. The patient had not had any evidence of biliary obstruction, or symptoms of duodenal obstruction, so no bypass procedures were performed. The abdomen was irrigated with warmed saline and appeared hemostatic. There was a 4cm longitudinal serosal tear on the anterior surface of the third portion of the duodenum, which was repaired with 3-0 silk Lembert sutures. There was leakage of chyle noted in the retroperitoneum, although no specific  site of leakage could be identified. A 19-Fr JP drain was brought onto the field and placed in the right retroperitoneum. The drain was brought out through the RLQ abdominal wall and secured to the skin with 2-0 Nylon suture. The retractors and wound protector were removed. The fascia was closed at midline with a running looped 1 PDS suture. Scarpa's layer was closed with a running 3-0 Vicryl and the skin was closed with a running 4-0 monocryl suture. Dermabond was applied.  The patient tolerated the procedure well. All counts were correct x2 at the end of the procedure. The patient was extubated and taken to PACU in stable condition.  Michaelle Birks, MD 02/15/22 10:33 AM

## 2022-02-15 NOTE — Transfer of Care (Signed)
Immediate Anesthesia Transfer of Care Note  [name]  Procedure(s) Performed: ATTEMPTED WHIPPLE PROCEDURE STAGING DIAGNOSTIC  Patient Location: PACU  Anesthesia Type:General  Level of Consciousness: drowsy and patient cooperative  Airway & Oxygen Therapy: Patient Spontanous Breathing and Patient connected to nasal cannula oxygen  Post-op Assessment: Report given to RN, Post -op Vital signs reviewed and stable, and Patient moving all extremities X 4  Post vital signs: Reviewed and stable  Last Vitals:  Vitals Value Taken Time  BP 133/70 02/15/22 1025  Temp 36.6 C 02/15/22 1025  Pulse 66 02/15/22 1027  Resp 13 02/15/22 1027  SpO2 97 % 02/15/22 1027  Vitals shown include unvalidated device data.  Last Pain:  Vitals:   02/15/22 0641  TempSrc:   PainSc: 0-No pain         Complications: No notable events documented.

## 2022-02-15 NOTE — Anesthesia Procedure Notes (Signed)
Procedure Name: Intubation Date/Time: 02/15/2022 8:07 AM  Performed by: Darletta Moll, CRNAPre-anesthesia Checklist: Patient identified, Emergency Drugs available, Suction available and Patient being monitored Patient Re-evaluated:Patient Re-evaluated prior to induction Oxygen Delivery Method: Circle system utilized Preoxygenation: Pre-oxygenation with 100% oxygen Induction Type: IV induction Ventilation: Mask ventilation without difficulty Laryngoscope Size: Mac Grade View: Grade I Tube type: Oral Tube size: 7.0 mm Number of attempts: 1 Airway Equipment and Method: Stylet Placement Confirmation: ETT inserted through vocal cords under direct vision, positive ETCO2 and breath sounds checked- equal and bilateral Secured at: 21 cm Tube secured with: Tape Dental Injury: Teeth and Oropharynx as per pre-operative assessment

## 2022-02-16 ENCOUNTER — Encounter (HOSPITAL_COMMUNITY): Payer: Self-pay | Admitting: Surgery

## 2022-02-16 LAB — CBC
HCT: 28.5 % — ABNORMAL LOW (ref 36.0–46.0)
Hemoglobin: 9.6 g/dL — ABNORMAL LOW (ref 12.0–15.0)
MCH: 34.9 pg — ABNORMAL HIGH (ref 26.0–34.0)
MCHC: 33.7 g/dL (ref 30.0–36.0)
MCV: 103.6 fL — ABNORMAL HIGH (ref 80.0–100.0)
Platelets: 183 K/uL (ref 150–400)
RBC: 2.75 MIL/uL — ABNORMAL LOW (ref 3.87–5.11)
RDW: 14.1 % (ref 11.5–15.5)
WBC: 6.9 K/uL (ref 4.0–10.5)
nRBC: 0 % (ref 0.0–0.2)

## 2022-02-16 LAB — COMPREHENSIVE METABOLIC PANEL WITH GFR
ALT: 68 U/L — ABNORMAL HIGH (ref 0–44)
AST: 63 U/L — ABNORMAL HIGH (ref 15–41)
Albumin: 2.7 g/dL — ABNORMAL LOW (ref 3.5–5.0)
Alkaline Phosphatase: 92 U/L (ref 38–126)
Anion gap: 7 (ref 5–15)
BUN: 23 mg/dL (ref 8–23)
CO2: 25 mmol/L (ref 22–32)
Calcium: 8.1 mg/dL — ABNORMAL LOW (ref 8.9–10.3)
Chloride: 103 mmol/L (ref 98–111)
Creatinine, Ser: 0.76 mg/dL (ref 0.44–1.00)
GFR, Estimated: >60 mL/min (ref >60)
Glucose, Bld: 103 mg/dL — ABNORMAL HIGH (ref 70–99)
Potassium: 4.2 mmol/L (ref 3.5–5.1)
Sodium: 135 mmol/L (ref 135–145)
Total Bilirubin: 0.3 mg/dL (ref 0.3–1.2)
Total Protein: 5.1 g/dL — ABNORMAL LOW (ref 6.5–8.1)

## 2022-02-16 LAB — POCT I-STAT 7, (LYTES, BLD GAS, ICA,H+H)
Acid-Base Excess: 0 mmol/L (ref 0.0–2.0)
Bicarbonate: 25.1 mmol/L (ref 20.0–28.0)
Calcium, Ion: 1.2 mmol/L (ref 1.15–1.40)
HCT: 29 % — ABNORMAL LOW (ref 36.0–46.0)
Hemoglobin: 9.9 g/dL — ABNORMAL LOW (ref 12.0–15.0)
O2 Saturation: 100 %
Potassium: 4.2 mmol/L (ref 3.5–5.1)
Sodium: 140 mmol/L (ref 135–145)
TCO2: 26 mmol/L (ref 22–32)
pCO2 arterial: 41.3 mmHg (ref 32–48)
pH, Arterial: 7.392 (ref 7.35–7.45)
pO2, Arterial: 312 mmHg — ABNORMAL HIGH (ref 83–108)

## 2022-02-16 LAB — SURGICAL PATHOLOGY

## 2022-02-16 MED ORDER — CALCIUM CARBONATE ANTACID 500 MG PO CHEW
1.0000 | CHEWABLE_TABLET | Freq: Three times a day (TID) | ORAL | Status: DC | PRN
  Administered 2022-02-16: 200 mg via ORAL
  Filled 2022-02-16: qty 1

## 2022-02-16 NOTE — Progress Notes (Signed)
Mobility Specialist - Progress Note   02/16/22 1200  Mobility  Activity Transferred from chair to bed  Level of Assistance Standby assist, set-up cues, supervision of patient - no hands on  Assistive Device None  Distance Ambulated (ft) 4 ft  Activity Response Tolerated well  Mobility Referral Yes  $Mobility charge 1 Mobility    Pt received in recliner requesting to get back to bed. C/o pain that limited distance. Left in bed w/ bed alarm on and call bell in reach.   [facility] Specialist Please contact via SecureChat or Rehab office at [phone]

## 2022-02-16 NOTE — Anesthesia Postprocedure Evaluation (Signed)
Anesthesia Post Note  Patient: Alece Koppel  Procedure(s) Performed: ATTEMPTED WHIPPLE PROCEDURE STAGING DIAGNOSTIC     Patient location during evaluation: PACU Anesthesia Type: General Level of consciousness: awake and alert Pain management: pain level controlled Vital Signs Assessment: post-procedure vital signs reviewed and stable Respiratory status: spontaneous breathing, nonlabored ventilation, respiratory function stable and patient connected to nasal cannula oxygen Cardiovascular status: blood pressure returned to baseline and stable Postop Assessment: no apparent nausea or vomiting Anesthetic complications: no   No notable events documented.  Last Vitals:  Vitals:   02/16/22 0853 02/16/22 1506  BP: 118/62 (!) 99/54  Pulse:  74  Resp:  17  Temp:  37 C  SpO2:  96%    Last Pain:  Vitals:   02/16/22 1506  TempSrc: Oral  PainSc:                  March Rummage Sollie Vultaggio

## 2022-02-16 NOTE — TOC Initial Note (Addendum)
Transition of Care (TOC) - Initial/Assessment Note   Spoke to patient at bedside. Patient from home alone. Plans to continue treatment. She does not drive anymore but her insurance pays for transportation to treatments.   Son Sheryl Bender and DIL Sheryl Bender live in [place] , currently they are in [place] visiting a friend with medical issues. Son Sheryl Bender lives 15 minutes from her. PT recommendation no PT follow up. Patient has a JP drain, prior to discharge bedside nurse at hospital will teach patient and family how to manage / care for drain at home.   NCM asked if NCM could call a family member to discuss no PT follow up recommendation and drain care , to see if they were able to assist at discharge if needed. Patient declined. She stated Sheryl Bender is at work and does not want him to "get in trouble". She states he plans to visit her this evening and she will discuss JP drain etc with him.   She is planning on applying for medicaid then personnel care services ( she is aware lengthy process) , she has also spoken to [employer] regarding personnel care services.   Patient Details  Name: Sheryl Bender MRN: [id number] Date of Birth: [birth year]  Transition of Care [facility]) CM/SW Contact:    Marilu Favre, RN Phone Number: 02/16/2022, 12:15 PM  Clinical Narrative:                   Expected Discharge Plan: Home/Self Care Barriers to Discharge: Continued Medical Work up   Patient Goals and CMS Choice Patient states their goals for this hospitalization and ongoing recovery are:: to return to home          Expected Discharge Plan and Services   Discharge Planning Services: CM Consult   Living arrangements for the past 2 months: Single Family Home                 DME Arranged: N/A         HH Arranged: NA          Prior Living Arrangements/Services Living arrangements for the past 2 months: Single Family Home Lives with:: Self Patient language and need for interpreter  reviewed:: Yes Do you feel safe going back to the place where you live?: Yes      Need for Family Participation in Patient Care: Yes (Comment) Care giver support system in place?:  (see note, son lives 15 minutes away, she did NOT want me to call any family) Current home services: DME Criminal Activity/Legal Involvement Pertinent to Current Situation/Hospitalization: No - Comment as needed  Activities of Daily Living      Permission Sought/Granted   Permission granted to share information with : No              Emotional Assessment Appearance:: Appears stated age Attitude/Demeanor/Rapport: Engaged Affect (typically observed): Accepting Orientation: : Oriented to Self, Oriented to Place, Oriented to  Time, Oriented to Situation Alcohol / Substance Use: Not Applicable Psych Involvement: No (comment)  Admission diagnosis:  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] Pancreatic cancer [facility]) [C25.9] Patient Active Problem List   Diagnosis Date Noted   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   Pancreatic cancer ([place]) 02/15/2022   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   Genetic testing 09/23/2021   Port-A-Cath in place 09/15/2021   Malnutrition of moderate degree 08/25/2021   Epigastric pain 08/24/2021   Nausea & vomiting 08/24/2021   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   Physical deconditioning  Malignant neoplasm of pancreas ([place])    Iron deficiency anemia 07/15/2021   Essential hypertension 07/15/2021   Constipation    Pancreatitis, acute 07/13/2021   Hypokalemia 07/13/2021   GERD (gastroesophageal reflux disease) 07/13/2021   Depression 07/13/2021   Dilation of pancreatic duct 01/28/2021   Colon cancer screening 01/28/2021   Lumbar scoliosis 12/16/2015   PCP:  Sherrilee Gilles, DO Pharmacy:   [id number] [place], [place] [id number]: [phone]: [phone]    Social Determinants of Health  (SDOH) Social History: SDOH Screenings   Tobacco Use: Low Risk  (02/15/2022)   SDOH Interventions:     Readmission Risk Interventions    07/17/2021    1:22 PM  Readmission Risk Prevention Plan  Transportation Screening Complete  Home Care Screening Complete  Medication Review (RN CM) Complete

## 2022-02-16 NOTE — Progress Notes (Signed)
    1 Day Post-Op  Subjective: No acute issues overnight. Reports mild nausea last night, none this morning. Pain controlled.   Objective: Vital signs in last 24 hours: Temp:  [97.9 F (36.6 C)-98.8 F (37.1 C)] 98.8 F (37.1 C) (01/23 0513) Pulse Rate:  [67-84] 80 (01/23 0513) Resp:  [11-22] 18 (01/23 0059) BP: (114-137)/(53-75) 114/53 (01/23 0513) SpO2:  [94 %-99 %] 97 % (01/23 0513) Last BM Date : 02/14/22  Intake/Output from previous day: 01/22 0701 - 01/23 0700 In: 2008.3 [P.O.:120; I.V.:1838.3; IV Piggyback:50] Out: 1761 [Urine:1045; Drains:185; Blood:300] Intake/Output this shift: No intake/output data recorded.  PE: General: resting comfortably, NAD Neuro: alert and oriented, no focal deficits Resp: normal work of breathing on nasal cannula Abdomen: soft, nondistended, mildly tender to palpation. Upper midline incision clean and dry with mild ecchymosis, no erythema or induration. RUQ JP with slightly chylous fluid. Extremities: warm and well-perfused GU: foley draining clear yellow urine   Lab Results:  Recent Labs    02/15/22 0554 02/16/22 0603  WBC 4.2 6.9  HGB 11.2* 9.6*  HCT 34.3* 28.5*  PLT 166 183   BMET Recent Labs    02/15/22 0554 02/16/22 0603  NA 139 135  K 4.1 4.2  CL 106 103  CO2 25 25  GLUCOSE 85 103*  BUN 29* 23  CREATININE 0.86 0.76  CALCIUM 8.6* 8.1*   PT/INR No results for input(s): "LABPROT", "INR" in the last 72 hours. CMP     Component Value Date/Time   NA 135 02/16/2022 0603   K 4.2 02/16/2022 0603   CL 103 02/16/2022 0603   CO2 25 02/16/2022 0603   GLUCOSE 103 (H) 02/16/2022 0603   BUN 23 02/16/2022 0603   CREATININE 0.76 02/16/2022 0603   CALCIUM 8.1 (L) 02/16/2022 0603   PROT 5.1 (L) 02/16/2022 0603   ALBUMIN 2.7 (L) 02/16/2022 0603   AST 63 (H) 02/16/2022 0603   ALT 68 (H) 02/16/2022 0603   ALKPHOS 92 02/16/2022 0603   BILITOT 0.3 02/16/2022 0603   GFRNONAA >60 02/16/2022 0603   GFRAA >60 12/08/2015 1501    Lipase     Component Value Date/Time   LIPASE 340 (H) 08/24/2021 0522       Studies/Results: No results found.  Anti-infectives: Anti-infectives (From admission, onward)    Start     Dose/Rate Route Frequency Ordered Stop   02/15/22 0815  piperacillin-tazobactam (ZOSYN) IVPB 3.375 g        3.375 g 100 mL/hr over 30 Minutes Intravenous To Surgery 02/15/22 0805 02/15/22 0808        Assessment/Plan 77 yo female with pancreatic adenocarcinoma, POD1 s/p exploratory laparotomy, cholecystectomy (Whipple aborted). - Clear liquid diet - IVF at 75 ml/hr - D/C foley - Chyle leak: monitor volume - Multimodal pain control - Mobilize, PT ordered - VTE: lovenox, SCDs - Dispo: med-surg  I discussed with the patient this morning and yesterday with her family that her cancer was unfortunately unresectable due to the presence of significant chronic inflammation. Will plan to refer to radiation oncology after discharge. Patient will also continue chemotherapy.    LOS: 1 day    Michaelle Birks, MD [facility] Surgery General, Hepatobiliary and Pancreatic Surgery 02/16/22 8:07 AM

## 2022-02-16 NOTE — Progress Notes (Signed)
Pt requesting med for heart burn. Message sent to shelby allen,MD. waiting for order to be put in.

## 2022-02-16 NOTE — Evaluation (Signed)
Physical Therapy Evaluation Patient Details Name: Sheryl Bender MRN: [id number] DOB: 1945-07-18 Today's Date: 02/16/2022  History of Present Illness  77 y.o. female presents to [facility] hospital on 02/15/2022 for whipple procedure. PMH includes OA, anxiety, depression, pancreatic adenocarcinoma, PE.  Clinical Impression  Pt presents to PT with deficits in activity tolerance, power, gait, endurance. Pt is limited by abdominal pain at this time, but is mobilizing well for limited periods. Pt is able to transfer to recliner but declines further mobility due to pain. PT encourages frequent mobilization with increased ambulation distances with staff assistance once pain is better managed. PT anticipates the pt will progress well and that no post-acute PT services will be indicated.       Recommendations for follow up therapy are one component of a multi-disciplinary discharge planning process, led by the attending physician.  Recommendations may be updated based on patient status, additional functional criteria and insurance authorization.  Follow Up Recommendations No PT follow up      Assistance Recommended at Discharge PRN  Patient can return home with the following  Assist for transportation;Assistance with cooking/housework    Equipment Recommendations None recommended by PT  Recommendations for Other Services       Functional Status Assessment Patient has had a recent decline in their functional status and demonstrates the ability to make significant improvements in function in a reasonable and predictable amount of time.     Precautions / Restrictions Precautions Precautions: Fall Precaution Comments: JP drain abdomen Restrictions Weight Bearing Restrictions: No      Mobility  Bed Mobility Overal bed mobility: Needs Assistance Bed Mobility: Rolling, Sidelying to Sit Rolling: Min guard Sidelying to sit: Min assist            Transfers Overall transfer level: Needs  assistance Equipment used: None Transfers: Sit to/from Stand Sit to Stand: Min guard                Ambulation/Gait Ambulation/Gait assistance: Min guard Gait Distance (Feet): 3 Feet Assistive device: None Gait Pattern/deviations: Step-to pattern Gait velocity: reduced Gait velocity interpretation: <1.8 ft/sec, indicate of risk for recurrent falls   General Gait Details: slowed step-to gait, pt declines further ambulation due to pain  Stairs            Wheelchair Mobility    Modified Rankin (Stroke Patients Only)       Balance Overall balance assessment: Needs assistance Sitting-balance support: No upper extremity supported, Feet supported Sitting balance-Leahy Scale: Good     Standing balance support: No upper extremity supported, During functional activity Standing balance-Leahy Scale: Fair                               Pertinent Vitals/Pain Pain Assessment Pain Assessment: 0-10 Pain Score: 9  Pain Location: abdomen Pain Descriptors / Indicators: Sore Pain Intervention(s): Patient requesting pain meds-RN notified    Home Living Family/patient expects to be discharged to:: Private residence Living Arrangements: Alone Available Help at Discharge: Other (Comment);Family;Available PRN/intermittently;Friend(s) (church friends have been assisting with transport recently) Type of Home: House Home Access: Stairs to enter Entrance Stairs-Rails: None Entrance Stairs-Number of Steps: 1   Home Layout: One level Home Equipment: Conservation officer, nature (2 wheels);Shower seat      Prior Function Prior Level of Function : Independent/Modified Independent             Mobility Comments: has stopped driving recently  Hand Dominance   Dominant Hand: Right    Extremity/Trunk Assessment   Upper Extremity Assessment Upper Extremity Assessment: Overall WFL for tasks assessed    Lower Extremity Assessment Lower Extremity Assessment: Overall  WFL for tasks assessed    Cervical / Trunk Assessment Cervical / Trunk Assessment: Other exceptions (abdominal surgery)  Communication   Communication: No difficulties (does have hearing aides but cannot find them during session)  Cognition Arousal/Alertness: Awake/alert Behavior During Therapy: Anxious Overall Cognitive Status: Within Functional Limits for tasks assessed                                          General Comments General comments (skin integrity, edema, etc.): VSS on RA    Exercises     Assessment/Plan    PT Assessment Patient needs continued PT services  PT Problem List Decreased activity tolerance;Decreased balance;Decreased mobility;Pain       PT Treatment Interventions DME instruction;Gait training;Stair training;Functional mobility training;Therapeutic activities;Therapeutic exercise;Balance training;Neuromuscular re-education;Patient/family education    PT Goals (Current goals can be found in the Care Plan section)  Acute Rehab PT Goals Patient Stated Goal: to return to independence PT Goal Formulation: With patient Time For Goal Achievement: 03/02/22 Potential to Achieve Goals: Good    Frequency Min 3X/week     Co-evaluation               AM-PAC PT "6 Clicks" Mobility  Outcome Measure Help needed turning from your back to your side while in a flat bed without using bedrails?: A Little Help needed moving from lying on your back to sitting on the side of a flat bed without using bedrails?: A Little Help needed moving to and from a bed to a chair (including a wheelchair)?: A Little Help needed standing up from a chair using your arms (e.g., wheelchair or bedside chair)?: A Little Help needed to walk in hospital room?: A Little Help needed climbing 3-5 steps with a railing? : Total 6 Click Score: 16    End of Session   Activity Tolerance: Patient limited by pain Patient left: in chair;with chair alarm set;with call  bell/phone within reach Nurse Communication: Mobility status PT Visit Diagnosis: Other abnormalities of gait and mobility (R26.89);Pain Pain - part of body:  (abdomen)    Time: 2248-2500 PT Time Calculation (min) (ACUTE ONLY): 30 min   Charges:   PT Evaluation $PT Eval Low Complexity: 1 Low          Zenaida Niece, PT, DPT Acute Rehabilitation Office [phone]   Zenaida Niece 02/16/2022, 10:08 AM

## 2022-02-17 MED ORDER — ORAL CARE MOUTH RINSE: 15.0000 mL | OROMUCOSAL | Status: DC | PRN

## 2022-02-17 NOTE — Progress Notes (Signed)
    2 Days Post-Op  Subjective: No acute issues overnight. Tolerating clear liquids, no nausea or vomiting. Mobilized with PT.   Objective: Vital signs in last 24 hours: Temp:  [98 F (36.7 C)-98.6 F (37 C)] 98 F (36.7 C) (01/24 0431) Pulse Rate:  [74-83] 75 (01/24 0431) Resp:  [17] 17 (01/24 0431) BP: (99-118)/(53-62) 100/53 (01/24 0431) SpO2:  [93 %-96 %] 93 % (01/24 0431) Last BM Date : 02/14/22  Intake/Output from previous day: 01/23 0701 - 01/24 0700 In: 1697.1 [P.O.:920; I.V.:777.1] Out: 58 [Drains:85] Intake/Output this shift: No intake/output data recorded.  PE: General: resting comfortably, NAD Neuro: alert and oriented, no focal deficits Resp: normal work of breathing on nasal cannula Abdomen: soft, mildly distended, nontender. Upper midline incision clean and dry with mild ecchymosis, no erythema or induration. RUQ JP with serosanguinous fluid. Extremities: warm and well-perfused    Lab Results:  Recent Labs    02/15/22 0554 02/15/22 0855 02/16/22 0603  WBC 4.2  --  6.9  HGB 11.2* 9.9* 9.6*  HCT 34.3* 29.0* 28.5*  PLT 166  --  183   BMET Recent Labs    02/15/22 0554 02/15/22 0855 02/16/22 0603  NA 139 140 135  K 4.1 4.2 4.2  CL 106  --  103  CO2 25  --  25  GLUCOSE 85  --  103*  BUN 29*  --  23  CREATININE 0.86  --  0.76  CALCIUM 8.6*  --  8.1*   PT/INR No results for input(s): "LABPROT", "INR" in the last 72 hours. CMP     Component Value Date/Time   NA 135 02/16/2022 0603   K 4.2 02/16/2022 0603   CL 103 02/16/2022 0603   CO2 25 02/16/2022 0603   GLUCOSE 103 (H) 02/16/2022 0603   BUN 23 02/16/2022 0603   CREATININE 0.76 02/16/2022 0603   CALCIUM 8.1 (L) 02/16/2022 0603   PROT 5.1 (L) 02/16/2022 0603   ALBUMIN 2.7 (L) 02/16/2022 0603   AST 63 (H) 02/16/2022 0603   ALT 68 (H) 02/16/2022 0603   ALKPHOS 92 02/16/2022 0603   BILITOT 0.3 02/16/2022 0603   GFRNONAA >60 02/16/2022 0603   GFRAA >60 12/08/2015 1501   Lipase      Component Value Date/Time   LIPASE 340 (H) 08/24/2021 0522       Studies/Results: No results found.    Assessment/Plan 77 yo female with pancreatic adenocarcinoma, POD2 s/p exploratory laparotomy, cholecystectomy (Whipple aborted). - Advance to soft diet, SLIV - Drain output is no longer chylous, anticipate removal prior to discharge. - Multimodal pain control - Ambulate TID - VTE: lovenox, SCDs - Dispo: med-surg    LOS: 2 days    Michaelle Birks, MD [facility] Surgery General, Hepatobiliary and Pancreatic Surgery 02/17/22 7:47 AM

## 2022-02-17 NOTE — Progress Notes (Signed)
Mobility Specialist - Progress Note   02/17/22 0900  Mobility  Activity Ambulated with assistance in hallway  Level of Assistance Contact guard assist, steadying assist  Assistive Device Other (Comment) (Hallway Rails/ Furniture)  Distance Ambulated (ft) 100 ft  Activity Response Tolerated well  Mobility Referral Yes  $Mobility charge 1 Mobility    Pt received in recliner agreeable to mobility. MinG assist d/t lateral stumbling x4 in hallway. Stated she uses furniture in home to remain steady, encouraged use of hallway rails. Left in recliner w/ call bell in her lap and all needs met.   [place] Specialist Please contact via SecureChat or Rehab office at [phone]

## 2022-02-18 MED ORDER — ALUM & MAG HYDROXIDE-SIMETH 200-200-20 MG/5ML PO SUSP: 30.0000 mL | ORAL | Status: DC | PRN

## 2022-02-18 MED ORDER — LINACLOTIDE 72 MCG PO CAPS
72.0000 ug | ORAL_CAPSULE | Freq: Every day | ORAL | Status: DC
  Administered 2022-02-18 – 2022-02-19 (×2): 72 ug via ORAL
  Filled 2022-02-18 (×2): qty 1

## 2022-02-18 MED ORDER — POLYETHYLENE GLYCOL 3350 17 G PO PACK
17.0000 g | PACK | Freq: Every day | ORAL | Status: DC
  Administered 2022-02-18: 17 g via ORAL
  Filled 2022-02-18: qty 1

## 2022-02-18 NOTE — Progress Notes (Signed)
Mobility Specialist - Progress Note   02/18/22 1557  Mobility  Activity Ambulated with assistance in hallway  Level of Assistance Contact guard assist, steadying assist  Assistive Device None  Distance Ambulated (ft) 100 ft  Activity Response Tolerated well  Mobility Referral Yes  $Mobility charge 1 Mobility    Pt received in room and agreeable. No complaints throughout. Left in bed w/ all needs met.   [place] Specialist Please contact via SecureChat or Rehab office at [phone]

## 2022-02-18 NOTE — Progress Notes (Signed)
    3 Days Post-Op  Subjective: No acute issues. Tolerating soft diet, denies nausea/vomiting. Pain controlled. Ambulating. Passing small amounts of flatus, no bowel movements yet.   Objective: Vital signs in last 24 hours: Temp:  [97.9 F (36.6 C)-99.7 F (37.6 C)] 97.9 F (36.6 C) (01/25 0441) Pulse Rate:  [71-86] 73 (01/25 0441) Resp:  [16-18] 17 (01/25 0441) BP: (107-121)/(46-63) 107/46 (01/25 0441) SpO2:  [95 %-97 %] 95 % (01/25 0441) Last BM Date : 02/14/22  Intake/Output from previous day: 01/24 0701 - 01/25 0700 In: 1000 [P.O.:1000] Out: 105 [Drains:105] Intake/Output this shift: No intake/output data recorded.  PE: General: resting comfortably, NAD Neuro: alert and oriented, no focal deficits Resp: normal work of breathing Abdomen: soft, minimally distended, nontender. Upper midline incision clean and dry with mild ecchymosis, no erythema or induration. RUQ JP with serosanguinous fluid. Extremities: warm and well-perfused    Lab Results:  Recent Labs    02/15/22 0855 02/16/22 0603  WBC  --  6.9  HGB 9.9* 9.6*  HCT 29.0* 28.5*  PLT  --  183   BMET Recent Labs    02/15/22 0855 02/16/22 0603  NA 140 135  K 4.2 4.2  CL  --  103  CO2  --  25  GLUCOSE  --  103*  BUN  --  23  CREATININE  --  0.76  CALCIUM  --  8.1*   PT/INR No results for input(s): "LABPROT", "INR" in the last 72 hours. CMP     Component Value Date/Time   NA 135 02/16/2022 0603   K 4.2 02/16/2022 0603   CL 103 02/16/2022 0603   CO2 25 02/16/2022 0603   GLUCOSE 103 (H) 02/16/2022 0603   BUN 23 02/16/2022 0603   CREATININE 0.76 02/16/2022 0603   CALCIUM 8.1 (L) 02/16/2022 0603   PROT 5.1 (L) 02/16/2022 0603   ALBUMIN 2.7 (L) 02/16/2022 0603   AST 63 (H) 02/16/2022 0603   ALT 68 (H) 02/16/2022 0603   ALKPHOS 92 02/16/2022 0603   BILITOT 0.3 02/16/2022 0603   GFRNONAA >60 02/16/2022 0603   GFRAA >60 12/08/2015 1501   Lipase     Component Value Date/Time   LIPASE 340  (H) 08/24/2021 0522       Studies/Results: No results found.    Assessment/Plan 77 yo female with pancreatic adenocarcinoma, POD3 s/p exploratory laparotomy, cholecystectomy (Whipple aborted). - Regular diet - Drain remains serosanguinous, remove today. - Multimodal pain control - Ambulate TID - Bowel regimen: miralax, colace, resume home Linzess. - VTE: lovenox, SCDs - Dispo: med-surg, anticipate discharge home tomorrow.    LOS: 3 days    Michaelle Birks, MD [facility] Surgery General, Hepatobiliary and Pancreatic Surgery 02/18/22 7:42 AM

## 2022-02-18 NOTE — Care Management Important Message (Signed)
Important Message  Patient Details  Name: Sheryl Bender MRN: [id number] Date of Birth: 1945/11/24   Medicare Important Message Given:  Yes     Hannah Beat 02/18/2022, 11:26 AM

## 2022-02-18 NOTE — Progress Notes (Signed)
Mobility Specialist - Progress Note   02/18/22 0900  Mobility  Activity Transferred from bed to chair  Level of Assistance Standby assist, set-up cues, supervision of patient - no hands on  Assistive Device None  Distance Ambulated (ft) 4 ft  Activity Response Tolerated well  Mobility Referral Yes  $Mobility charge 1 Mobility    Pt received in bed agreeable to mobility. Requested to sit in recliner for breakfast. No complaints throughout. Left in recliner w/ call bell in reach and all needs met.   [place] Specialist Please contact via SecureChat or Rehab office at [phone]

## 2022-02-19 LAB — BPAM RBC
Blood Product Expiration Date: 202402142359
Blood Product Expiration Date: 202402142359
ISSUE DATE / TIME: 202401220858
ISSUE DATE / TIME: 202401220858
Unit Type and Rh: 6200
Unit Type and Rh: 6200

## 2022-02-19 LAB — TYPE AND SCREEN
ABO/RH(D): A POS
Antibody Screen: NEGATIVE
Unit division: 0
Unit division: 0

## 2022-02-19 MED ORDER — OXYCODONE HCL 5 MG PO TABS: 5.0000 mg | ORAL_TABLET | Freq: Four times a day (QID) | ORAL | 0 refills | Status: DC | PRN

## 2022-02-19 MED ORDER — METHOCARBAMOL 500 MG PO TABS: 500.0000 mg | ORAL_TABLET | Freq: Three times a day (TID) | ORAL | 0 refills | Status: DC | PRN

## 2022-02-19 NOTE — Progress Notes (Signed)
Attempted to call patient's son who is also POA to discuss discharge plan multiple times, with no answer.

## 2022-02-19 NOTE — Progress Notes (Signed)
Mobility Specialist - Progress Note   02/19/22 0900  Mobility  Activity Ambulated with assistance in hallway  Level of Assistance Contact guard assist, steadying assist  Assistive Device None  Distance Ambulated (ft) 80 ft  Activity Response Tolerated well  Mobility Referral Yes  $Mobility charge 1 Mobility    Pt received in recliner agreeable to mobility. Distance limited by c/o fatigue. Left in recliner w/ all needs met and call bell in reach.   [place] Specialist Please contact via SecureChat or Rehab office at [phone]

## 2022-02-19 NOTE — Discharge Summary (Signed)
Physician Discharge Summary   Patient ID: Sheryl Bender [id number] 77 y.o. 1945/01/31  Admit date: 02/15/2022  Discharge date and time: 02/19/2022 11:09 AM   Admitting Physician: Dwan Bolt, MD   Discharge Physician: Michaelle Birks, MD  Admission Diagnoses: Pancreatic adenocarcinoma [facility]) [C25.9] Pancreatic cancer [facility]) [C25.9]  Discharge Diagnoses: Same  Admission Condition: stable  Discharged Condition: stable  Indication for Admission: Sheryl Bender is a 77 yo female with pancreatic adenocarcinoma, and developed acute pancreatitis followed by a large symptomatic pancreatic pseudocyst following an EUS with biopsy. This required treatment with an endoscopic cystgastrostomy. She was treated with neoadjuvant chemotherapy, and restaging scans showed complete resolution of her pseudocyst with no evidence of disease progression. After an extensive discussion of the risks and benefits of surgery, she agreed to proceed with a Whipple.  Hospital Course: The patient was taken to the OR on 02/15/22 for a planned Whipple, however intra-op was found to have extensive thick adhesions surrounding the SMV and root of the mesentery secondary to prior pancreatitis, and the resection was aborted. A cholecystectomy was performed. For further details of the procedure please see separately dictated operative note. Postoperatively she was admitted to the med-surg floor in stable condition. Her diet was advanced from clear liquids to a soft diet, which she tolerated without difficulty. She was evaluated by physical therapy and cleared for discharge home without further follow up. Her drain was initially chylous on POD1, but cleared and was removed prior to discharge. She had return of bowel function. On the morning of POD4, she was ambulating, tolerating a regular diet, having bowel function, and pain was controlled on oral medications. She was examined and deemed appropriate for discharge home with outpatient  follow up.  Consults: None  Significant Diagnostic Studies: N/A  Treatments: analgesia: acetaminophen, Dilaudid, and oxycodone and surgery: exploratory laparotomy, cholecystectomy  Discharge Exam: General: resting comfortably, NAD Neuro: alert and oriented, no focal deficits Resp: normal work of breathing on room air Abdomen: soft, nondistended, nontender to palpation, incision clean and dry Extremities: warm and well-perfused   Disposition: Discharge disposition: 01-Home or Self Care       Patient Instructions:  Allergies as of 02/19/2022       Reactions   Other Hives, Other (See Comments)   Cherry wood just cut- "smelled it and broke out"; cannot tolerate ANY cherry fragrances, either   Cherry Hives   Wound Dressing Adhesive Rash, Other (See Comments)   Band-Aids = local reaction        Medication List     TAKE these medications    acetaminophen 500 MG tablet Commonly known as: TYLENOL Take 2 tablets (1,000 mg total) by mouth every 8 (eight) hours as needed for moderate pain, headache or fever. What changed: how much to take   alum & mag hydroxide-simeth 200-200-20 MG/5ML suspension Commonly known as: MAALOX/MYLANTA Take 30 mLs by mouth every 4 (four) hours as needed for indigestion.   buPROPion 300 MG 24 hr tablet Commonly known as: WELLBUTRIN XL Take 300 mg by mouth daily after breakfast.   busPIRone 10 MG tablet Commonly known as: BUSPAR Take 20 mg by mouth 2 (two) times daily.   calcium carbonate 500 MG chewable tablet Commonly known as: TUMS - dosed in mg elemental calcium Chew 1 tablet (200 mg of elemental calcium total) by mouth 2 (two) times daily as needed for indigestion or heartburn. What changed: how much to take   cephALEXin 500 MG capsule Commonly known as: KEFLEX Take 500  mg by mouth 2 (two) times daily.   cyanocobalamin 500 MCG tablet Commonly known as: VITAMIN B12 Take 1 tablet (500 mcg total) by mouth daily.   escitalopram 20  MG tablet Commonly known as: LEXAPRO Take 20 mg by mouth at bedtime.   fluorouracil CALGB 27782 1,920 mg/m2 in sodium chloride 0.9 % 150 mL Inject 1,920 mg/m2 into the vein over 48 hr.   lactose free nutrition Liqd Take 237 mLs by mouth 2 (two) times daily between meals.   lactulose 10 GM/15ML solution Commonly known as: CHRONULAC Take 15 mLs (10 g total) by mouth at bedtime. Take 15 ml at bedtime every night to assist with bowel movements. If bowel movement has not occurred in 3-4 days take 15 ml every 3 hours until bowel movement has occurred.   LEUCOVORIN CALCIUM IV Inject into the vein every 14 (fourteen) days.   lidocaine-prilocaine cream Commonly known as: EMLA Apply a small amount to port a cath site (do not rub in) and cover with plastic wrap 1 hour prior to infusion appointments   linaclotide 72 MCG capsule Commonly known as: LINZESS Take 1 capsule (72 mcg total) by mouth daily before breakfast.   methocarbamol 500 MG tablet Commonly known as: ROBAXIN Take 1 tablet (500 mg total) by mouth every 8 (eight) hours as needed for muscle spasms.   metoprolol succinate 25 MG 24 hr tablet Commonly known as: TOPROL-XL Take 25 mg by mouth daily after breakfast.   mirtazapine 30 MG tablet Commonly known as: REMERON Take 30 mg by mouth at bedtime.   neomycin-bacitracin-polymyxin Oint Commonly known as: NEOSPORIN Apply 1 Application topically as needed for wound care.   omeprazole 40 MG capsule Commonly known as: PRILOSEC Take 1 capsule (40 mg total) by mouth daily.   ondansetron 8 MG disintegrating tablet Commonly known as: ZOFRAN-ODT Take 1 tablet (8 mg total) by mouth every 8 (eight) hours as needed for nausea or vomiting.   OXALIPLATIN IV Inject into the vein every 14 (fourteen) days.   oxyCODONE 5 MG immediate release tablet Commonly known as: Oxy IR/ROXICODONE Take 1 tablet (5 mg total) by mouth every 6 (six) hours as needed for severe pain.   prochlorperazine  10 MG tablet Commonly known as: COMPAZINE Take 1 tablet (10 mg total) by mouth every 6 (six) hours as needed for nausea or vomiting (Nausea or vomiting).   QUEtiapine 25 MG tablet Commonly known as: SEROQUEL Take 25 mg by mouth 3 (three) times daily after meals.   QUEtiapine 200 MG tablet Commonly known as: SEROQUEL Take 200 mg by mouth at bedtime.   sodium chloride 0.65 % Soln nasal spray Commonly known as: OCEAN Place 1 spray into both nostrils as needed for congestion.   SYSTANE OP Place 1 drop into both eyes at bedtime.   Vicks VapoInhaler Inha Inhale 1 puff into the lungs daily as needed (congestion).   Vitamin D3 125 MCG (5000 UT) Caps Take 5,000 Units by mouth every other day.       Activity: no driving for 2 weeks and no heavy lifting for 8 weeks Diet: regular diet Wound Care: keep wound clean and dry  Follow-up with Dr. Zenia Resides on 03/10/22.  Signed: Dwan Bolt 02/19/2022 5:46 PM

## 2022-02-19 NOTE — Progress Notes (Signed)
Pain well-controlled with oral medications. Patient is tolerating a regular diet and had a bowel movement yesterday. She has been ambulating. Drain removed yesterday. Anticipate discharge home today.

## 2022-02-19 NOTE — Discharge Instructions (Addendum)
[facility] DISCHARGE INSTRUCTIONS  Activity No heavy lifting greater than 15 pounds for 6 weeks after surgery. Ok to shower, but do not bathe or submerge incision underwater. Do not drive while taking narcotic pain medication.  Wound Care Your incision is covered with skin glue called Dermabond. This will peel off on its own over time. You may shower and allow warm soapy water to run over your incision. Gently pat dry. Do not submerge your incision underwater. Monitor your incision for any new redness, tenderness, or drainage.  When to Call us: Fever greater than 100.5 New redness, drainage, or swelling at incision site Severe pain, nausea, or vomiting Jaundice (yellowing of the whites of the eyes or skin)  Follow-up You have an appointment scheduled with Dr. Zenia Resides on March 10, 2022 at 1:50pm. This will be at the [facility] Surgery office at [address] [place], [place], [place]. Please arrive at least 15 minutes prior to your scheduled appointment time.  For questions or concerns, please call the office at [phone].

## 2022-03-01 ENCOUNTER — Inpatient Hospital Stay: Payer: Medicare Other | Attending: Hematology | Admitting: Hematology

## 2022-03-01 ENCOUNTER — Other Ambulatory Visit (HOSPITAL_COMMUNITY): Payer: Self-pay

## 2022-03-01 ENCOUNTER — Encounter: Payer: Self-pay | Admitting: Hematology

## 2022-03-01 VITALS — BP 115/65 | HR 79 | Temp 98.0°F | Resp 18 | Wt 122.9 lb

## 2022-03-01 DIAGNOSIS — R634 Abnormal weight loss: Secondary | ICD-10-CM | POA: Insufficient documentation

## 2022-03-01 DIAGNOSIS — C250 Malignant neoplasm of head of pancreas: Secondary | ICD-10-CM

## 2022-03-01 DIAGNOSIS — R140 Abdominal distension (gaseous): Secondary | ICD-10-CM | POA: Diagnosis not present

## 2022-03-01 DIAGNOSIS — Z86711 Personal history of pulmonary embolism: Secondary | ICD-10-CM | POA: Diagnosis not present

## 2022-03-01 DIAGNOSIS — R5383 Other fatigue: Secondary | ICD-10-CM | POA: Diagnosis not present

## 2022-03-01 DIAGNOSIS — C253 Malignant neoplasm of pancreatic duct: Secondary | ICD-10-CM | POA: Diagnosis present

## 2022-03-01 MED ORDER — CAPECITABINE 500 MG PO TABS
1500.0000 mg | ORAL_TABLET | Freq: Two times a day (BID) | ORAL | 0 refills | Status: DC
  Filled 2022-03-01: qty 180, 30d supply, fill #0

## 2022-03-01 NOTE — Patient Instructions (Addendum)
[facility]  Discharge Instructions  You were seen and examined today by Dr. Delton Coombes.  Dr. Delton Coombes has recommended chemotherapy and radiation therapy as discussed with Dr. Zenia Resides due to the inability to do the curative surgery.  Because the surgery was not achievable, there is likely not a curative option for your cancer.  Dr. Delton Coombes will refer you to Radiation Oncology in [place].  Dr. Delton Coombes will start you on a chemotherapy pill known as Xeloda. This is taken only on the days that you receive radiation. While you are on Xeloda, Dr. Delton Coombes will follow-up with you weekly.  Follow-up as scheduled.  Thank you for choosing [facility] to provide your oncology and hematology care.   To afford each patient quality time with our provider, please arrive at least 15 minutes before your scheduled appointment time. You may need to reschedule your appointment if you arrive late (10 or more minutes). Arriving late affects you and other patients whose appointments are after yours.  Also, if you miss three or more appointments without notifying the office, you may be dismissed from the clinic at the provider's discretion.    Again, thank you for choosing [facility].  Our hope is that these requests will decrease the amount of time that you wait before being seen by our physicians.   If you have a lab appointment with the [facility] please come in thru the Main Entrance and check in at the main information desk.           _____________________________________________________________  Should you have questions after your visit to [facility], please contact our office at [phone] and follow the prompts.  Our office hours are 8:00 a.m. to 4:30 p.m. Monday - Thursday and 8:00 a.m. to 2:30 p.m. Friday.  Please note that voicemails left after 4:00 p.m. may not be returned until the following business  day.  We are closed weekends and all major holidays.  You do have access to a nurse 24-7, just call the main number to the clinic [phone] and do not press any options, hold on the line and a nurse will answer the phone.    For prescription refill requests, have your pharmacy contact our office and allow 72 hours.    Masks are optional in the cancer centers. If you would like for your care team to wear a mask while they are taking care of you, please let them know. You may have one support person who is at least 79 years old accompany you for your appointments.

## 2022-03-01 NOTE — Progress Notes (Signed)
[employer], [place] [address]   CLINIC:  Medical Oncology/Hematology  PCP:  Sherrilee Gilles, DO [address] 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 VUS in ATM called c.4367G>A and in [facility] called c.238G>A also identified. The remainder of testing was negative/normal. The report date is 09/20/2021.  The CancerNext-Expanded + RNAinsight gene panel offered by [employer] and includes sequencing and rearrangement analysis for the following 77 genes: IP, ALK, APC*, ATM*, AXIN2, BAP1, BARD1, BLM, BMPR1A, BRCA1*, BRCA2*, BRIP1*, CDC73, CDH1*,CDK4, CDKN1B, CDKN2A, CHEK2*, CTNNA1, DICER1, FANCC, FH, FLCN, GALNT12, KIF1B, LZTR1, MAX, MEN1, MET, MLH1*, MSH2*, MSH3, MSH6*, MUTYH*, NBN, NF1*, NF2, NTHL1, PALB2*, PHOX2B, PMS2*, POT1, PRKAR1A, PTCH1, PTEN*, RAD51C*, RAD51D*,RB1, RECQL, RET, SDHA, SDHAF2, SDHB, SDHC, SDHD, SMAD4, SMARCA4, SMARCB1, SMARCE1, STK11, SUFU, TMEM127, TP53*,TSC1, TSC2, VHL and XRCC2 (sequencing and deletion/duplication); EGFR, EGLN1, HOXB13, KIT, MITF, PDGFRA, POLD1 and POLE (sequencing only); EPCAM and GREM1 (deletion/duplication only).     CANCER STAGING:  Cancer Staging  Malignant neoplasm of pancreas [facility]) Staging form: Exocrine Pancreas, AJCC 8th Edition -  Clinical stage from 07/29/2021: Stage IA (cT1, cN0, cM0) - Unsigned   INTERVAL HISTORY:  Ms. Shandora Koogler, a 77 y.o. female, seen for follow-up of pancreatic cancer.  She has completed 6 cycles of FOLFIRINOX chemotherapy on 11/24/2021. She was last seen by me on 12/15/21.  The patient was taken to the OR on 02/15/22 for a planned Whipple. Intraoperatively, she was found to have extensive thick adhesions surrounding the SMV and root of the mesentery secondary to prior pancreatitis, and the resection was aborted. A cholecystectomy was performed.   Today, she states that she is disappointed that her procedure did not go as planned. Her appetite level is at 70%, and she often has to make herself eat. Her energy level is at 70% with her lingering fatigue. She reports having mild abdominal bloating which has progressively improved since her surgery.   REVIEW OF SYSTEMS:  Review of Systems  Constitutional:  Positive for fatigue. Negative for chills and fever.  HENT:   Negative for lump/mass, mouth sores, nosebleeds, sore throat and trouble swallowing.   Eyes:  Negative for eye problems.  Respiratory:  Negative for cough and shortness of breath.   Cardiovascular:  Negative for chest pain, leg swelling and palpitations.  Gastrointestinal:  Negative for abdominal pain, constipation, diarrhea, nausea and vomiting.  Genitourinary:  Negative for bladder incontinence, difficulty urinating, dysuria, frequency, hematuria and nocturia.   Musculoskeletal:  Negative for arthralgias, back pain, flank pain, myalgias and neck pain.  Skin:  Negative for itching and rash.  Neurological:  Positive for dizziness. Negative for headaches and numbness.  Hematological:  Does not bruise/bleed easily.  Psychiatric/Behavioral:  Negative for depression, sleep disturbance and suicidal ideas. The patient is not nervous/anxious.   All other  systems reviewed and are negative.   PAST MEDICAL/SURGICAL HISTORY:  Past Medical  History:  Diagnosis Date   Anxiety    Arthritis    Depression    Dysrhythmia    hx palpitations greater than 5 yrs -neg echo, stress per pt ? where or dr   GERD (gastroesophageal reflux disease)    occ   Nausea & vomiting 08/24/2021   Pancreatic adenocarcinoma ([place])    Pancreatic pseudocyst    Port-A-Cath in place 09/15/2021   Pulmonary embolism [facility])    September 2022   Past Surgical History:  Procedure Laterality Date   ANTERIOR LAT LUMBAR FUSION Right 12/16/2015   Procedure: RIGHT LUMBAR TWO-THREE, LUMBAR THREE-FOUR, LUMBAR FOUR-FIVE ANTEROLATERAL LUMBAR INTERBODY FUSION;  Surgeon: Erline Levine, MD;  Location: [facility];  Service: Neurosurgery;  Laterality: Right;   BALLOON DILATION N/A 08/27/2021   Procedure: BALLOON DILATION;  Surgeon: Rush Landmark Telford Nab., MD;  Location: [facility];  Service: Gastroenterology;  Laterality: N/A;   BIOPSY  07/09/2021   Procedure: BIOPSY;  Surgeon: Rush Landmark Telford Nab., MD;  Location: [facility];  Service: Gastroenterology;;   CYST GASTROSTOMY  08/27/2021   Procedure: CYST GASTROSTOMY;  Surgeon: Irving Copas., MD;  Location: [facility];  Service: Gastroenterology;;   ESOPHAGOGASTRODUODENOSCOPY N/A 07/09/2021   Procedure: ESOPHAGOGASTRODUODENOSCOPY (EGD);  Surgeon: Irving Copas., MD;  Location: [facility];  Service: Gastroenterology;  Laterality: N/A;   ESOPHAGOGASTRODUODENOSCOPY (EGD) WITH PROPOFOL N/A 08/27/2021   Procedure: ESOPHAGOGASTRODUODENOSCOPY (EGD) WITH PROPOFOL;  Surgeon: Rush Landmark Telford Nab., MD;  Location: [facility];  Service: Gastroenterology;  Laterality: N/A;   ESOPHAGOGASTRODUODENOSCOPY (EGD) WITH PROPOFOL N/A 10/08/2021   Procedure: ESOPHAGOGASTRODUODENOSCOPY (EGD) WITH PROPOFOL;  Surgeon: Rush Landmark Telford Nab., MD;  Location: [facility];  Service: Gastroenterology;  Laterality: N/A;   EUS N/A 07/09/2021   Procedure: UPPER ENDOSCOPIC ULTRASOUND (EUS) RADIAL;  Surgeon: Irving Copas., MD;   Location: [facility];  Service: Gastroenterology;  Laterality: N/A;   EUS N/A 08/27/2021   Procedure: UPPER ENDOSCOPIC ULTRASOUND (EUS) LINEAR;  Surgeon: Irving Copas., MD;  Location: [facility];  Service: Gastroenterology;  Laterality: N/A;   FINE NEEDLE ASPIRATION  07/09/2021   Procedure: FINE NEEDLE ASPIRATION (FNA) LINEAR;  Surgeon: Irving Copas., MD;  Location: [facility];  Service: Gastroenterology;;   IR IMAGING GUIDED PORT INSERTION  09/14/2021   LAPAROSCOPY N/A 02/15/2022   Procedure: STAGING DIAGNOSTIC;  Surgeon: Dwan Bolt, MD;  Location: [facility];  Service: General;  Laterality: N/A;   LUMBAR PERCUTANEOUS PEDICLE SCREW 3 LEVEL Bilateral 12/16/2015   Procedure: PERCUTANEOUS PEDICLE SCREWS BILATERALLY AT LUMBAR TWO-FIVE;  Surgeon: Erline Levine, MD;  Location: [facility];  Service: Neurosurgery;  Laterality: Bilateral;   PANCREATIC STENT PLACEMENT  08/27/2021   Procedure: PANCREATIC STENT PLACEMENT;  Surgeon: Irving Copas., MD;  Location: [facility];  Service: Gastroenterology;;   Lavell Islam REMOVAL  10/08/2021   Procedure: STENT REMOVAL;  Surgeon: Irving Copas., MD;  Location: [facility];  Service: Gastroenterology;;   TUBAL LIGATION  [place] N/A 02/15/2022   Procedure: ATTEMPTED WHIPPLE PROCEDURE;  Surgeon: Dwan Bolt, MD;  Location: [facility];  Service: General;  Laterality: N/A;    SOCIAL HISTORY:  Social History   Socioeconomic History   Marital status: Widowed    Spouse name: Not on file   Number of children: Not on file   Years of education: Not on file   Highest education level: Not on file  Occupational History   Not on file  Tobacco Use   Smoking status: Never   Smokeless tobacco: Never  Vaping Use   Vaping Use: Never used  Substance and Sexual Activity   Alcohol use: No   Drug use: Never   Sexual activity: Not on file  Other Topics Concern   Not on file  Social History Narrative   Not on file   Social  Determinants of Health   Financial Resource Strain: Not on file  Food Insecurity: Not on file  Transportation Needs: Not on file  Physical Activity: Not on file  Stress: Not on file  Social Connections: Not on file  Intimate Partner Violence: Not on file    FAMILY HISTORY:  Family History  Problem Relation Age of Onset   Pancreatitis Brother        alcoholic pancreatitis   Pancreatic cancer Neg Hx    Colon cancer Neg Hx     CURRENT MEDICATIONS:  Current Outpatient Medications  Medication Sig Dispense Refill   acetaminophen (TYLENOL) 500 MG tablet Take 2 tablets (1,000 mg total) by mouth every 8 (eight) hours as needed for moderate pain, headache or fever. (Patient taking differently: Take 750-1,000 mg by mouth every 8 (eight) hours as needed for moderate pain, headache or fever.)     alum & mag hydroxide-simeth (MAALOX/MYLANTA) 200-200-20 MG/5ML suspension Take 30 mLs by mouth every 4 (four) hours as needed for indigestion. 355 mL 0   Aromatic Inhalants (VICKS VAPOINHALER) INHA Inhale 1 puff into the lungs daily as needed (congestion).     buPROPion (WELLBUTRIN XL) 300 MG 24 hr tablet Take 300 mg by mouth daily after breakfast.     busPIRone (BUSPAR) 10 MG tablet Take 20 mg by mouth 2 (two) times daily.     calcium carbonate (TUMS - DOSED IN MG ELEMENTAL CALCIUM) 500 MG chewable tablet Chew 1 tablet (200 mg of elemental calcium total) by mouth 2 (two) times daily as needed for indigestion or heartburn. (Patient taking differently: Chew 1-2 tablets by mouth 2 (two) times daily as needed for indigestion or heartburn.)     capecitabine (XELODA) 500 MG tablet Take 3 tablets (1,500 mg total) by mouth 2 (two) times daily after a meal. 180 tablet 0   cephALEXin (KEFLEX) 500 MG capsule Take 500 mg by mouth 2 (two) times daily.     Cholecalciferol (VITAMIN D3) 125 MCG (5000 UT) CAPS Take 5,000 Units by mouth every other day.     escitalopram (LEXAPRO) 20 MG tablet Take 20 mg by mouth at  bedtime.     fluorouracil CALGB 40981 1,920 mg/m2 in sodium chloride 0.9 % 150 mL Inject 1,920 mg/m2 into the vein over 48 hr.     lactose free nutrition (BOOST) LIQD Take 237 mLs by mouth 2 (two) times daily between meals.     lactulose (CHRONULAC) 10 GM/15ML solution Take 15 mLs (10 g total) by mouth at bedtime. Take 15 ml at bedtime every night to assist with bowel movements. If bowel movement has not occurred in 3-4 days take 15 ml every 3 hours until bowel movement has occurred. 236 mL 0   lidocaine-prilocaine (EMLA) cream Apply a small amount to port a cath site (do not rub in) and cover with plastic wrap 1 hour prior to infusion appointments 30 g 3   linaclotide (LINZESS) 72 MCG capsule Take 1 capsule (72 mcg total) by mouth daily before breakfast. 30 capsule 12   methocarbamol (ROBAXIN) 500 MG tablet Take 1 tablet (500 mg total) by mouth  every 8 (eight) hours as needed for muscle spasms. 15 tablet 0   metoprolol succinate (TOPROL-XL) 25 MG 24 hr tablet Take 25 mg by mouth daily after breakfast.     mirtazapine (REMERON) 30 MG tablet Take 30 mg by mouth at bedtime.     neomycin-bacitracin-polymyxin (NEOSPORIN) OINT Apply 1 Application topically as needed for wound care.     omeprazole (PRILOSEC) 40 MG capsule Take 1 capsule (40 mg total) by mouth daily. 30 capsule 12   ondansetron (ZOFRAN-ODT) 8 MG disintegrating tablet Take 1 tablet (8 mg total) by mouth every 8 (eight) hours as needed for nausea or vomiting. 30 tablet 0   oxyCODONE (OXY IR/ROXICODONE) 5 MG immediate release tablet Take 1 tablet (5 mg total) by mouth every 6 (six) hours as needed for severe pain. 20 tablet 0   Polyethyl Glycol-Propyl Glycol (SYSTANE OP) Place 1 drop into both eyes at bedtime.     prochlorperazine (COMPAZINE) 10 MG tablet Take 1 tablet (10 mg total) by mouth every 6 (six) hours as needed for nausea or vomiting (Nausea or vomiting). 60 tablet 3   QUEtiapine (SEROQUEL) 200 MG tablet Take 200 mg by mouth at  bedtime.     QUEtiapine (SEROQUEL) 25 MG tablet Take 25 mg by mouth 3 (three) times daily after meals.     sodium chloride (OCEAN) 0.65 % SOLN nasal spray Place 1 spray into both nostrils as needed for congestion.     vitamin B-12 (CYANOCOBALAMIN) 500 MCG tablet Take 1 tablet (500 mcg total) by mouth daily. 30 tablet 2   LEUCOVORIN CALCIUM IV Inject into the vein every 14 (fourteen) days. (Patient not taking: Reported on 03/01/2022)     OXALIPLATIN IV Inject into the vein every 14 (fourteen) days. (Patient not taking: Reported on 03/01/2022)     No current facility-administered medications for this visit.    ALLERGIES:  Allergies  Allergen Reactions   Other Hives and Other (See Comments)    Cherry wood just cut- "smelled it and broke out"; cannot tolerate ANY cherry fragrances, either      Cherry Hives   Wound Dressing Adhesive Rash and Other (See Comments)    Band-Aids = local reaction    PHYSICAL EXAM:  Performance status (ECOG): 1 - Symptomatic but completely ambulatory  Vitals:   03/01/22 0953  BP: 115/65  Pulse: 79  Resp: 18  Temp: 98 F (36.7 C)  SpO2: 96%    Wt Readings from Last 3 Encounters:  03/01/22 55.7 kg (122 lb 14.4 oz)  02/15/22 56.7 kg (125 lb)  02/11/22 57.1 kg (125 lb 12.8 oz)   Physical Exam Vitals reviewed. Exam conducted with a chaperone present.  Constitutional:      Appearance: Normal appearance.  Cardiovascular:     Rate and Rhythm: Normal rate and regular rhythm.     Pulses: Normal pulses.     Heart sounds: Normal heart sounds.  Pulmonary:     Effort: Pulmonary effort is normal.     Breath sounds: Normal breath sounds.  Abdominal:     General: A surgical scar is present.     Palpations: Abdomen is soft. There is no hepatomegaly, splenomegaly or mass.     Tenderness: There is no abdominal tenderness.     Comments: Midline abdominal surgical scar healing well no signs or symptoms of infection  Lymphadenopathy:     Upper Body:     Right  upper body: No supraclavicular, axillary or pectoral adenopathy.  Left upper body: No supraclavicular, axillary or pectoral adenopathy.  Neurological:     General: No focal deficit present.     Mental Status: She is alert and oriented to person, place, and time.  Psychiatric:        Mood and Affect: Mood normal.        Behavior: Behavior normal.      LABORATORY DATA:  I have reviewed the labs as listed.     Latest Ref Rng & Units 02/16/2022    6:03 AM 02/15/2022    8:55 AM 02/15/2022    5:54 AM  CBC  WBC 4.0 - 10.5 K/uL 6.9   4.2   Hemoglobin 12.0 - 15.0 g/dL 9.6  9.9  11.2   Hematocrit 36.0 - 46.0 % 28.5  29.0  34.3   Platelets 150 - 400 K/uL 183   166       Latest Ref Rng & Units 02/16/2022    6:03 AM 02/15/2022    8:55 AM 02/15/2022    5:54 AM  CMP  Glucose 70 - 99 mg/dL 103   85   BUN 8 - 23 mg/dL 23   29   Creatinine 0.44 - 1.00 mg/dL 0.76   0.86   Sodium 135 - 145 mmol/L 135  140  139   Potassium 3.5 - 5.1 mmol/L 4.2  4.2  4.1   Chloride 98 - 111 mmol/L 103   106   CO2 22 - 32 mmol/L 25   25   Calcium 8.9 - 10.3 mg/dL 8.1   8.6   Total Protein 6.5 - 8.1 g/dL 5.1     Total Bilirubin 0.3 - 1.2 mg/dL 0.3     Alkaline Phos 38 - 126 U/L 92     AST 15 - 41 U/L 63     ALT 0 - 44 U/L 68       DIAGNOSTIC IMAGING:  I have independently reviewed the scans and discussed with the patient. CT Abdomen Pelvis W Wo Contrast  Result Date: 02/08/2022 CLINICAL DATA:  Pancreatic cancer.  Restaging. EXAM: CT ABDOMEN AND PELVIS WITHOUT AND WITH CONTRAST TECHNIQUE: Multidetector CT imaging of the abdomen and pelvis was performed following the standard protocol before and following the bolus administration of intravenous contrast. RADIATION DOSE REDUCTION: This exam was performed according to the departmental dose-optimization program which includes automated exposure control, adjustment of the mA and/or kV according to patient size and/or use of iterative reconstruction technique.  CONTRAST:  165m OMNIPAQUE IOHEXOL 300 MG/ML  SOLN COMPARISON:  02/07/2021 FINDINGS: Lower chest: Unremarkable. Hepatobiliary: No suspicious focal abnormality within the liver parenchyma. Stable tiny hypodensities in the liver parenchyma are too small to characterize but likely benign. No followup imaging is recommended. There is no evidence for gallstones, gallbladder wall thickening, or pericholecystic fluid. No intrahepatic or extrahepatic biliary dilation. Pancreas: Similar appearance of diffuse main duct dilatation in the body and tail of the pancreas with abrupt truncation at the level of the pancreatic head. As on prior imaging, there is no discrete or measurable mass lesion in the pancreas. Spleen: Bilobed splenic parenchyma without a discrete or focal abnormality. Adrenals/Urinary Tract: No adrenal nodule or mass. Central sinus cysts are noted in both kidneys as before. No evidence for hydroureter. The urinary bladder appears normal for the degree of distention. Stomach/Bowel: Stomach is unremarkable. No gastric wall thickening. No evidence of outlet obstruction. Wall thickening in the antral region is similar to prior, likely related to peristalsis. Duodenum is normally  positioned as is the ligament of Treitz. No small bowel wall thickening. No small bowel dilatation. The terminal ileum is normal. The appendix is normal. No gross colonic mass. No colonic wall thickening. Vascular/Lymphatic: There is mild atherosclerotic calcification of the abdominal aorta without aneurysm. There is no gastrohepatic or hepatoduodenal ligament lymphadenopathy. No retroperitoneal or mesenteric lymphadenopathy. No pelvic sidewall lymphadenopathy. Reproductive: The uterus is unremarkable.  There is no adnexal mass. Other: No intraperitoneal free fluid. Musculoskeletal: No worrisome lytic or sclerotic osseous abnormality. Posterior lumbar fusion hardware evident from L2-L5. IMPRESSION: 1. Stable exam. No new or progressive  interval findings. 2. Similar appearance of diffuse main duct dilatation in the body and tail of the pancreas with abrupt truncation at the level of the pancreatic head. As on prior imaging, there is no discrete or measurable mass lesion in the pancreas. 3. No evidence for metastatic disease in the abdomen or pelvis. 4.  Aortic Atherosclerosis (ICD10-I70.0). Electronically Signed   By: Misty Stanley M.D.   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 - EGD/EUS (07/09/2021) by Dr. Rush Landmark: The region where the PD is strictured, darker appearance was identified.  There was no clear hypoechoic mass.  Endosonographic appearance suggestive of either chronic pancreatitis changes versus possible malignancy.  Endosonographic staging T1 N0 MX.  Pancreatic parenchymal abnormalities consisting of atrophy and hyperechoic strands.  No sign of significant pathology in the CBD.  No malignant appearing lymph nodes in the celiac region, peripancreatic region and porta hepatic region - Pathology (pancreatic duct FNA): Malignant cells consistent with adenocarcinoma - CA 19-9: 31 (0-35) - CTAP (07/13/2021): Fluid collection extending posterior to the gastric antrum and ventral to the pancreas.  Elongated collection extends to the greater curvature of the stomach with inflammation surrounding the collection suggestive of a pancreatic pseudocyst.  Chronic dilatation of pancreatic duct with abrupt termination of the duct dilatation in the head of the pancreas. - CT chest (07/16/2021): No evidence of metastatic disease in the chest. - 8 to 9 pound weight loss in the last 3 months, decreased appetite.       - PET scan (07/30/2021): Faint focus of increased metabolic activity, SUV 3.6, measuring 9 mm at the junction of the pancreatic head and body, suspicious for pancreatic  adenocarcinoma.  No obvious extrapancreatic spread of tumor.  Cystic lesion along the posterior margin of the stomach and anterior margin of the pancreatic head favored pseudocyst.  Small amount of pelvic ascites. - MRI of the abdomen pancreatic protocol (08/03/2021): Similar appearance of the pancreas, with abrupt transition from dilated to decompressed duct in the pancreatic head with no evidence of underlying mass.  Pseudocyst enlarged position between pancreatic head and GE junction with significant mass effect upon the pyloric region and gastric outlet obstruction radiologically.  No evidence of abdominal metastatic disease. - FOLFOX on 09/16/2021, FOLFIRINOX cycle 2 on 09/30/2021 - 02/15/2022: Staging laparoscopy, exploratory laparotomy.  Whipple's aborted due to extensive thick scarring with obliteration of tissue planes preventing safe dissection of SMV.    Social/family history: - She is accompanied by her son [name] today.  She lives at home by herself.  She is independent of ADLs and IADLs.  She did office work prior to retirement.  Non-smoker and nonalcoholic. - Brother (alcoholic) and maternal half uncle had pancreatitis.  No history of malignancy.  3. Pulmonary embolism: -  She was diagnosed with pulmonary embolism in January 2023 and then will hospital.  There does not appear to be any provoking factors for PE.  Reportedly Doppler was negative for DVT.  She was treated with Eliquis for 60 days, discontinued after that.   PLAN:  Stage I (T1 N0 MX) pancreatic adenocarcinoma: - CTAP pancreatic protocol on 12/08/2021: Unchanged appearance of the pancreas with no evidence of lymphadenopathy or metastatic disease.  Previously seen large pseudocyst anterior to the Peri pancreatic head is resolved. - Staging laparoscopy, exploratory laparotomy and cholecystectomy on 02/15/2022.  Changes of prior pancreatitis in the lesser sac, especially along the inferior border of the pancreas and root of the  mesentery with extensive thick scarring with obliteration of tissue planes preventing safe dissection of the SMV.  Thus resection was aborted.  No evidence of metastatic disease within the abdomen. - I have talked to her about chemoradiation therapy as the best next step.  There is no clear curative therapy at this point as she could not have surgery done. - I will make a referral to radiation oncology in [place] due to proximity. - We talked about Xeloda with concurrent radiation.  We will give her 1500 mg twice daily Monday through Friday throughout the course of radiation. - We discussed side effects including the rare chance of hand-foot skin reaction. - She will come back at the time of start of radiation.  2.  Weight loss: - Weight is stable.  She is eating all kinds of foods.    Orders placed this encounter:  Orders Placed This Encounter  Procedures   CBC with Differential   Comprehensive metabolic panel   Magnesium   Cancer antigen 19-9     I,[name],acting as a scribe for [name], MD.,have documented all relevant documentation on the behalf of [name], MD,as directed by  [name], MD while in the presence of [name], MD.  I, [name] MD, have reviewed the above documentation for accuracy and completeness, and I agree with the above.    [name], MD [place] [phone]

## 2022-03-02 ENCOUNTER — Other Ambulatory Visit: Payer: Self-pay

## 2022-03-02 ENCOUNTER — Other Ambulatory Visit (HOSPITAL_COMMUNITY): Payer: Self-pay

## 2022-03-02 ENCOUNTER — Telehealth: Payer: Self-pay

## 2022-03-02 NOTE — Telephone Encounter (Signed)
Oral Oncology [facility]ocate Encounter   Received notification that prior authorization for Capecitabine is required.   PA submitted verbally to [employer]/ [facility] on 03/03/22  Case ID: [id number]  Status is pending  [facility]'s Phone Number for follow-up Sheryl Bender, Sheryl Bender Sheryl Bender  [phone] (phone) [phone] (fax) 03/02/2022 9:31 AM

## 2022-03-03 ENCOUNTER — Other Ambulatory Visit: Payer: Self-pay

## 2022-03-03 ENCOUNTER — Other Ambulatory Visit (HOSPITAL_COMMUNITY): Payer: Self-pay

## 2022-03-03 ENCOUNTER — Telehealth: Payer: Self-pay | Admitting: Pharmacist

## 2022-03-03 DIAGNOSIS — C250 Malignant neoplasm of head of pancreas: Secondary | ICD-10-CM

## 2022-03-03 MED ORDER — CAPECITABINE 500 MG PO TABS
1500.0000 mg | ORAL_TABLET | Freq: Two times a day (BID) | ORAL | 0 refills | Status: DC
  Filled 2022-03-03 – 2022-03-12 (×3): qty 180, 30d supply, fill #0

## 2022-03-03 NOTE — Telephone Encounter (Signed)
Oral Oncology Pharmacist Encounter  Received new prescription for Xeloda (capecitabine) for the treatment of stage I pancreatic cancer (non-surgical candidate) in conjunction with radiation, planned duration of until the end of radiation.  CMP from 02/16/22 assessed, no relevant lab abnormalities. Prescription dose and frequency assessed.   Current medication list in Epic reviewed, a few DDIs with capecitabine identified: Omeprazole: Proton Pump Inhibitors (PPI) may diminish the therapeutic effect of capecitabine, varying information on the clinical impact. Recommend evaluating the need for a PPI/acid suppression. If acid suppression is needed, attempt switching to a H2 antagonist (eg, famotidine) if possible. Quetiapine: QT-prolonging Antipsychotics may enhance the QTc-prolonging effect of fluorouracil products. Recommended to monitor for QTc interval prolongation and ventricular arrhythmias (including torsades de pointes) when these drugs are combined.   Evaluated chart and no patient barriers to medication adherence identified.   Prescription has been e-scribed to the [facility] for benefits analysis and approval.  Oral Oncology Clinic will continue to follow for insurance authorization, copayment issues, initial counseling and start date.   Darl Pikes, PharmD, BCPS, BCOP, CPP Hematology/Oncology Clinical Pharmacist Practitioner [place]/DB/AP Oral [facility] [phone]  03/03/2022 10:51 AM

## 2022-03-05 ENCOUNTER — Other Ambulatory Visit (HOSPITAL_COMMUNITY): Payer: Self-pay

## 2022-03-05 ENCOUNTER — Encounter: Payer: Self-pay | Admitting: Hematology

## 2022-03-05 NOTE — Telephone Encounter (Addendum)
Oral Oncology Patient Advocate Encounter  Received notification that the request for prior authorization for Capecitabine has been denied due to medication being a Medicare Part B Drug.   Patient has a separate PPO insurance to cover Part B Drugs.  Patient's co-pay is $151.39 through Part B Plan: [employer]  Patient's co-pay using [facility] would be ~ $115 - $121   [name], [name] [facility] [place]  [phone] (phone) [phone] (fax) 03/05/2022 11:38 AM

## 2022-03-08 ENCOUNTER — Other Ambulatory Visit: Payer: Self-pay

## 2022-03-08 NOTE — Telephone Encounter (Signed)
Called patient to go over medication cost. Patient is agreeable to cost of Capecitabine (~$115 to $121) through [facility].

## 2022-03-12 ENCOUNTER — Other Ambulatory Visit (HOSPITAL_COMMUNITY): Payer: Self-pay

## 2022-03-12 ENCOUNTER — Other Ambulatory Visit: Payer: Self-pay

## 2022-03-12 NOTE — Telephone Encounter (Signed)
Oral Chemotherapy Pharmacist Encounter  Patient Education I spoke with patient for overview of new oral chemotherapy medication: Xeloda (capecitabine) for the treatment of pancreatic cancer, planned duration during radiation.   Pt is doing well. Counseled patient on administration, dosing, side effects, monitoring, drug-food interactions, safe handling, storage, and disposal. Patient will take 3 tablets (1528m) by mouth 2 times a day. She will only take on days she receives radiation therapy.  Side effects include but not limited to:  Diarrhea: patient has loperamide at home and knows how to take it. She was instructed to call the office if she has 4 or more loose stools per day. Nausea/vomiting: patient has anti-emetics on hand. Hand-Foot Syndrome: patient use a urea-based moisturizer. Mouth irritation Fatigue Infection  Reviewed with patient importance of keeping a medication schedule and plan for any missed doses.  After discussion with patient there are no patient barriers to medication adherence identified.   Ms. LShetleyvoiced understanding and appreciation. All questions answered. Medication handout provided.  [facility]. Patient knows to call the office with questions or concerns. Oral Chemotherapy Navigation Clinic will continue to follow.  KLaray Anger PharmD PGY-2 Pharmacy Resident Hematology/Oncology [phone] 03/12/2022 10:20 AM

## 2022-03-15 ENCOUNTER — Other Ambulatory Visit: Payer: Self-pay

## 2022-03-15 ENCOUNTER — Ambulatory Visit: Payer: Medicare Other | Admitting: Hematology

## 2022-03-15 NOTE — Telephone Encounter (Signed)
Patient radiation start date is pending her consult with Radonc. She knows not to start her capecitabine until her first day of radiation.

## 2022-03-16 ENCOUNTER — Other Ambulatory Visit (HOSPITAL_COMMUNITY): Payer: Self-pay

## 2022-03-24 ENCOUNTER — Other Ambulatory Visit (HOSPITAL_COMMUNITY): Payer: Self-pay

## 2022-03-30 ENCOUNTER — Inpatient Hospital Stay: Payer: Medicare Other | Admitting: Hematology

## 2022-03-30 ENCOUNTER — Inpatient Hospital Stay: Payer: Medicare Other

## 2022-04-02 NOTE — Progress Notes (Signed)
Notification received from [facility] that patient will start XRT on Monday, March 11th. I have called the patient to remind her how to take Xeloda only on the days of Radiation, 10 to 12 hours between doses. Reinforced the importance of weekly follow-up with Dr. Delton Coombes while taking Xeloda. Patient scheduled for XRT 04/05/2022 through 05/13/2022 therefore patient has been subsequently scheduled for follow-up with Dr. Delton Coombes 04/06/2022 through 05/18/2022. Patient verbalized understanding and is agreeable with plan of care. All questions addressed and answered to the patient's satisfaction.

## 2022-04-05 ENCOUNTER — Other Ambulatory Visit: Payer: Self-pay

## 2022-04-06 ENCOUNTER — Encounter: Payer: Self-pay | Admitting: Hematology

## 2022-04-06 ENCOUNTER — Inpatient Hospital Stay: Payer: Medicare Other

## 2022-04-06 ENCOUNTER — Inpatient Hospital Stay: Payer: Medicare Other | Attending: Hematology | Admitting: Hematology

## 2022-04-06 DIAGNOSIS — D649 Anemia, unspecified: Secondary | ICD-10-CM | POA: Diagnosis not present

## 2022-04-06 DIAGNOSIS — K1379 Other lesions of oral mucosa: Secondary | ICD-10-CM | POA: Diagnosis not present

## 2022-04-06 DIAGNOSIS — C250 Malignant neoplasm of head of pancreas: Secondary | ICD-10-CM | POA: Diagnosis not present

## 2022-04-06 DIAGNOSIS — Z86711 Personal history of pulmonary embolism: Secondary | ICD-10-CM | POA: Diagnosis not present

## 2022-04-06 DIAGNOSIS — C253 Malignant neoplasm of pancreatic duct: Secondary | ICD-10-CM | POA: Diagnosis not present

## 2022-04-06 DIAGNOSIS — D72819 Decreased white blood cell count, unspecified: Secondary | ICD-10-CM | POA: Diagnosis not present

## 2022-04-06 DIAGNOSIS — R42 Dizziness and giddiness: Secondary | ICD-10-CM | POA: Insufficient documentation

## 2022-04-06 LAB — CBC WITH DIFFERENTIAL/PLATELET
Abs Immature Granulocytes: 0 10*3/uL (ref 0.00–0.07)
Basophils Absolute: 0 10*3/uL (ref 0.0–0.1)
Basophils Relative: 0 %
Eosinophils Absolute: 0.1 10*3/uL (ref 0.0–0.5)
Eosinophils Relative: 2 %
HCT: 33.5 % — ABNORMAL LOW (ref 36.0–46.0)
Hemoglobin: 11.1 g/dL — ABNORMAL LOW (ref 12.0–15.0)
Immature Granulocytes: 0 %
Lymphocytes Relative: 31 %
Lymphs Abs: 1.1 10*3/uL (ref 0.7–4.0)
MCH: 32.8 pg (ref 26.0–34.0)
MCHC: 33.1 g/dL (ref 30.0–36.0)
MCV: 99.1 fL (ref 80.0–100.0)
Monocytes Absolute: 0.5 10*3/uL (ref 0.1–1.0)
Monocytes Relative: 14 %
Neutro Abs: 1.9 10*3/uL (ref 1.7–7.7)
Neutrophils Relative %: 53 %
Platelets: 204 10*3/uL (ref 150–400)
RBC: 3.38 MIL/uL — ABNORMAL LOW (ref 3.87–5.11)
RDW: 13.4 % (ref 11.5–15.5)
WBC: 3.6 10*3/uL — ABNORMAL LOW (ref 4.0–10.5)
nRBC: 0 % (ref 0.0–0.2)

## 2022-04-06 LAB — COMPREHENSIVE METABOLIC PANEL
ALT: 22 U/L (ref 0–44)
AST: 25 U/L (ref 15–41)
Albumin: 3.5 g/dL (ref 3.5–5.0)
Alkaline Phosphatase: 111 U/L (ref 38–126)
Anion gap: 8 (ref 5–15)
BUN: 34 mg/dL — ABNORMAL HIGH (ref 8–23)
CO2: 24 mmol/L (ref 22–32)
Calcium: 8.6 mg/dL — ABNORMAL LOW (ref 8.9–10.3)
Chloride: 105 mmol/L (ref 98–111)
Creatinine, Ser: 0.79 mg/dL (ref 0.44–1.00)
GFR, Estimated: >60 mL/min (ref >60)
Glucose, Bld: 111 mg/dL — ABNORMAL HIGH (ref 70–99)
Potassium: 4.2 mmol/L (ref 3.5–5.1)
Sodium: 137 mmol/L (ref 135–145)
Total Bilirubin: 0.5 mg/dL (ref 0.3–1.2)
Total Protein: 6.7 g/dL (ref 6.5–8.1)

## 2022-04-06 LAB — MAGNESIUM: Magnesium: 2.3 mg/dL (ref 1.7–2.4)

## 2022-04-06 MED ORDER — SODIUM CHLORIDE 0.9% FLUSH
10.0000 mL | Freq: Once | INTRAVENOUS | Status: AC
  Administered 2022-04-06: 10 mL

## 2022-04-06 MED ORDER — HEPARIN SOD (PORK) LOCK FLUSH 100 UNIT/ML IV SOLN
500.0000 [IU] | Freq: Once | INTRAVENOUS | Status: AC
  Administered 2022-04-06: 500 [IU] via INTRAVENOUS

## 2022-04-06 NOTE — Patient Instructions (Signed)
[facility]  Discharge Instructions: Thank you for choosing [facility] to provide your oncology and hematology care.  If you have a lab appointment with the [facility], please come in thru the Main Entrance and check in at the main information desk.  Wear comfortable clothing and clothing appropriate for easy access to any Portacath or PICC line.   We strive to give you quality time with your provider. You may need to reschedule your appointment if you arrive late (15 or more minutes).  Arriving late affects you and other patients whose appointments are after yours.  Also, if you miss three or more appointments without notifying the office, you may be dismissed from the clinic at the provider's discretion.      For prescription refill requests, have your pharmacy contact our office and allow 72 hours for refills to be completed.    Today you received the following chemotherapy and/or immunotherapy agents port flush with lab work.       To help prevent nausea and vomiting after your treatment, we encourage you to take your nausea medication as directed.  BELOW ARE SYMPTOMS THAT SHOULD BE REPORTED IMMEDIATELY: *FEVER GREATER THAN 100.4 F (38 C) OR HIGHER *CHILLS OR SWEATING *NAUSEA AND VOMITING THAT IS NOT CONTROLLED WITH YOUR NAUSEA MEDICATION *UNUSUAL SHORTNESS OF BREATH *UNUSUAL BRUISING OR BLEEDING *URINARY PROBLEMS (pain or burning when urinating, or frequent urination) *BOWEL PROBLEMS (unusual diarrhea, constipation, pain near the anus) TENDERNESS IN MOUTH AND THROAT WITH OR WITHOUT PRESENCE OF ULCERS (sore throat, sores in mouth, or a toothache) UNUSUAL RASH, SWELLING OR PAIN  UNUSUAL VAGINAL DISCHARGE OR ITCHING   Items with * indicate a potential emergency and should be followed up as soon as possible or go to the Emergency Department if any problems should occur.  Please show the CHEMOTHERAPY ALERT CARD or IMMUNOTHERAPY ALERT CARD at  check-in to the Emergency Department and triage nurse.  Should you have questions after your visit or need to cancel or reschedule your appointment, please contact [facility] [phone]  and follow the prompts.  Office hours are 8:00 a.m. to 4:30 p.m. Monday - Friday. Please note that voicemails left after 4:00 p.m. may not be returned until the following business day.  We are closed weekends and major holidays. You have access to a nurse at all times for urgent questions. Please call the main number to the clinic [phone] and follow the prompts.  For any non-urgent questions, you may also contact your provider using MyChart. We now offer e-Visits for anyone 54 and older to request care online for non-urgent symptoms. For details visit [email or web address].   Also download the MyChart app! Go to the app store, search "MyChart", open the app, select [facility], and log in with your MyChart username and password.

## 2022-04-06 NOTE — Progress Notes (Signed)
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 - EGD/EUS (07/09/2021) by Dr. Rush Landmark: The region where the PD is strictured, darker appearance was identified.  There was no clear hypoechoic mass.  Endosonographic appearance suggestive of either chronic pancreatitis changes versus possible malignancy.  Endosonographic staging T1 N0 MX.  Pancreatic parenchymal abnormalities consisting of atrophy and hyperechoic strands.  No sign of significant pathology in the CBD.  No malignant appearing lymph nodes in the celiac region, peripancreatic region and porta hepatic region - Pathology (pancreatic duct FNA): Malignant cells consistent with adenocarcinoma - CA 19-9: 31 (0-35) - CTAP (07/13/2021): Fluid collection extending posterior to the gastric antrum and ventral to the pancreas.  Elongated collection extends to the greater curvature of the stomach with inflammation surrounding the collection suggestive of a pancreatic pseudocyst.  Chronic dilatation of pancreatic duct with abrupt termination of the duct dilatation in the head of the pancreas. - CT chest (07/16/2021): No evidence of metastatic disease in the chest. - 8 to 9 pound weight loss in the last 3 months, decreased appetite.       - PET scan (07/30/2021): Faint focus of increased metabolic activity, SUV 3.6, measuring 9 mm at the junction of the pancreatic head and body, suspicious for pancreatic  adenocarcinoma.  No obvious extrapancreatic spread of tumor.  Cystic lesion along the posterior margin of the stomach and anterior margin of the pancreatic head favored pseudocyst.  Small amount of pelvic ascites. - MRI of the abdomen pancreatic protocol (08/03/2021): Similar appearance of the pancreas, with abrupt transition from dilated to decompressed duct in the pancreatic head with no evidence of underlying mass.  Pseudocyst enlarged position between pancreatic head and GE junction with significant mass effect upon the pyloric region and gastric outlet obstruction radiologically.  No evidence of abdominal metastatic disease. - FOLFOX on 09/16/2021, FOLFIRINOX cycle 2 on 09/30/2021 - 02/15/2022: Staging laparoscopy, exploratory laparotomy.  Whipple's aborted due to extensive thick scarring with obliteration of tissue planes preventing safe dissection of SMV. - XRT with Xeloda started on 04/05/2022    Social/family history: - She is accompanied by her son Sheryl Bender today.  She lives at home by herself.  She is independent of ADLs and IADLs.  She did office work prior to retirement.  Non-smoker and nonalcoholic. - Brother (alcoholic) and maternal half uncle had pancreatitis.  No history of malignancy.  3. Pulmonary embolism: - She was diagnosed with pulmonary embolism in January 2023 and then will hospital.  There does not appear to be any provoking factors for PE.  Reportedly Doppler was negative for DVT.  She was treated with Eliquis for 60 days, discontinued after that.  Plan: Stage I (T1 N0 MX) pancreatic adenocarcinoma: - XRT started on 04/05/2022, for total of 6 weeks. - She started taking Xeloda 3 tablets twice daily yesterday. - She denies any nausea/vomiting/diarrhea. - I have counseled her to use moisturizing lotion on the palms and soles. - Reviewed labs today which showed normal LFTs.  CBC  shows mild leukopenia with ANC of 1.9.  Hemoglobin improved to 11.1. - Continue Xeloda 3 tablets twice  daily Monday through Friday.  Use Compazine if she develops nausea or vomiting. - Use a solution of water, baking soda and salt to rinse her mouth every 2-3 hours during daytime. - RTC 1 week for follow-up with repeat labs.   2.  Weight loss: - She is eating multiple small meals per day.  She has gained 3 pounds since last visit.  Orders Placed This Encounter  Procedures   CBC with Differential/Platelet    Standing Status:   Future    Standing Expiration Date:   04/06/2023    Order Specific Question:   Release to patient    Answer:   Immediate   Comprehensive metabolic panel    Standing Status:   Future    Standing Expiration Date:   04/06/2023    Order Specific Question:   Release to patient    Answer:   Immediate   Magnesium    Standing Status:   Future    Standing Expiration Date:   04/06/2023    Order Specific Question:   Release to patient    Answer:   Immediate      I,[name],acting as a scribe for [name], MD.,have documented all relevant documentation on the behalf of [name], MD,as directed by  [name], MD while in the presence of [name], MD.   I, [name] MD, have reviewed the above documentation for accuracy and completeness, and I agree with the above.   [name], MD   3/12/20244:41 PM  CHIEF COMPLAINT:   Diagnosis: pancreatic cancer    Cancer Staging  Malignant neoplasm of pancreas [facility]) Staging form: Exocrine Pancreas, AJCC 8th Edition - Clinical stage from 07/29/2021: Stage IA (cT1, cN0, cM0) - Unsigned    Prior Therapy: none  Current Therapy:  FOLFIRINOX   HISTORY OF PRESENT ILLNESS:   Oncology History  Malignant neoplasm of pancreas ([place])  07/21/2021 Initial Diagnosis   Malignant neoplasm of pancreas ([place])   09/16/2021 -  Chemotherapy   Patient is on Treatment Plan : PANCREAS Modified FOLFIRINOX q14d x 4 cycles      Genetic Testing   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 VUS in ATM called c.4367G>A and in [facility] called c.238G>A also identified. The remainder of testing was negative/normal. The report date is 09/20/2021.  The CancerNext-Expanded + RNAinsight gene panel offered by [employer] and includes sequencing and rearrangement analysis for the following 77 genes: IP, ALK, APC*, ATM*, AXIN2, BAP1, BARD1, BLM, BMPR1A, BRCA1*, BRCA2*, BRIP1*, CDC73, CDH1*,CDK4, CDKN1B, CDKN2A, CHEK2*, CTNNA1, DICER1, FANCC, FH, FLCN, GALNT12, KIF1B, LZTR1, MAX, MEN1, MET, MLH1*, MSH2*, MSH3, MSH6*, MUTYH*, NBN, NF1*, NF2, NTHL1, PALB2*, PHOX2B, PMS2*, POT1, PRKAR1A, PTCH1, PTEN*, RAD51C*, RAD51D*,RB1, RECQL, RET, SDHA, SDHAF2, SDHB, SDHC, SDHD, SMAD4, SMARCA4, SMARCB1, SMARCE1, STK11, SUFU, TMEM127, TP53*,TSC1, TSC2, VHL and XRCC2 (sequencing and deletion/duplication); EGFR, EGLN1, HOXB13, KIT, MITF, PDGFRA, POLD1 and POLE (sequencing only); EPCAM and GREM1 (deletion/duplication only).      INTERVAL HISTORY:   Sheryl is a 77 y.o. female presenting to clinic today for follow up of pancreatic cancer. She was last seen by me on 03/01/22.  Today, she states that she is doing well overall. Her appetite level is at 75%. Her energy  level is at 50%. She denies any nausea/vomiting/diarrhea.  She started radiation and Xeloda yesterday.   PAST MEDICAL HISTORY:   Past Medical History: Past Medical History:  Diagnosis Date   Anxiety    Arthritis    Depression    Dysrhythmia    hx palpitations greater than 5 yrs -neg echo, stress per pt ? where or dr   GERD (gastroesophageal reflux disease)    occ   Nausea & vomiting 08/24/2021   Pancreatic adenocarcinoma ([place])    Pancreatic pseudocyst    Port-A-Cath in place 09/15/2021   Pulmonary embolism [facility])    September 2022    Surgical  History: Past Surgical History:  Procedure Laterality Date   ANTERIOR LAT LUMBAR FUSION Right 12/16/2015   Procedure: RIGHT LUMBAR TWO-THREE, LUMBAR THREE-FOUR, LUMBAR FOUR-FIVE ANTEROLATERAL LUMBAR INTERBODY FUSION;  Surgeon: Erline Levine, MD;  Location: [place];  Service: Neurosurgery;  Laterality: Right;   BALLOON DILATION N/A 08/27/2021   Procedure: BALLOON DILATION;  Surgeon: Rush Landmark Telford Nab., MD;  Location[facility];  Service: Gastroenterology;  Laterality: N/A;   BIOPSY  07/09/2021   Procedure: BIOPSY;  Surgeon: Rush Landmark Telford Nab., MD;  Location: [place];  Service: Gastroenterology;;   CYST GASTROSTOMY  08/27/2021   Procedure: CYST GASTROSTOMY;  Surgeon: Irving Copas., MD;  Location: [facility];  Service: Gastroenterology;;   ESOPHAGOGASTRODUODENOSCOPY N/A 07/09/2021   Procedure: ESOPHAGOGASTRODUODENOSCOPY (EGD);  Surgeon: Irving Copas., MD;  Location: [place];  Service: Gastroenterology;  Laterality: N/A;   ESOPHAGOGASTRODUODENOSCOPY (EGD) WITH PROPOFOL N/A 08/27/2021   Procedure: ESOPHAGOGASTRODUODENOSCOPY (EGD) WITH PROPOFOL;  Surgeon: Rush Landmark Telford Nab., MD;  Location: [facility];  Service: Gastroenterology;  Laterality: N/A;   ESOPHAGOGASTRODUODENOSCOPY (EGD) WITH PROPOFOL N/A 10/08/2021   Procedure: ESOPHAGOGASTRODUODENOSCOPY (EGD) WITH PROPOFOL;  Surgeon: Rush Landmark Telford Nab., MD;  Location: [facility];  Service: Gastroenterology;  Laterality: N/A;   EUS N/A 07/09/2021   Procedure: UPPER ENDOSCOPIC ULTRASOUND (EUS) RADIAL;  Surgeon: Irving Copas., MD;  Location: [place];  Service: Gastroenterology;  Laterality: N/A;   EUS N/A 08/27/2021   Procedure: UPPER ENDOSCOPIC ULTRASOUND (EUS) LINEAR;  Surgeon: Irving Copas., MD;  Location: [facility];  Service: Gastroenterology;  Laterality: N/A;   FINE NEEDLE ASPIRATION  07/09/2021   Procedure: FINE NEEDLE ASPIRATION (FNA) LINEAR;  Surgeon: Irving Copas., MD;   Location: [facility];  Service: Gastroenterology;;   IR IMAGING GUIDED PORT INSERTION  09/14/2021   LAPAROSCOPY N/A 02/15/2022   Procedure: STAGING DIAGNOSTIC;  Surgeon: Dwan Bolt, MD;  Location: [place];  Service: General;  Laterality: N/A;   LUMBAR PERCUTANEOUS PEDICLE SCREW 3 LEVEL Bilateral 12/16/2015   Procedure: PERCUTANEOUS PEDICLE SCREWS BILATERALLY AT LUMBAR TWO-FIVE;  Surgeon: Erline Levine, MD;  Location: [place];  Service: Neurosurgery;  Laterality: Bilateral;   PANCREATIC STENT PLACEMENT  08/27/2021   Procedure: PANCREATIC STENT PLACEMENT;  Surgeon: Irving Copas., MD;  Location: [facility];  Service: Gastroenterology;;   Lavell Islam REMOVAL  10/08/2021   Procedure: STENT REMOVAL;  Surgeon: Irving Copas., MD;  Location: [facility];  Service: Gastroenterology;;   TUBAL LIGATION  1974   WHIPPLE PROCEDURE N/A 02/15/2022   Procedure: ATTEMPTED WHIPPLE PROCEDURE;  Surgeon: Dwan Bolt, MD;  Location: [place];  Service: General;  Laterality: N/A;    Social History: Social History   Socioeconomic History   Marital status: Widowed    Spouse name: Not on file   Number of children: Not on file   Years of education: Not on file  Highest education level: Not on file  Occupational History   Not on file  Tobacco Use   Smoking status: Never   Smokeless tobacco: Never  Vaping Use   Vaping Use: Never used  Substance and Sexual Activity   Alcohol use: No   Drug use: Never   Sexual activity: Not on file  Other Topics Concern   Not on file  Social History Narrative   Not on file   Social Determinants of Health   Financial Resource Strain: Not on file  Food Insecurity: Not on file  Transportation Needs: Not on file  Physical Activity: Not on file  Stress: Not on file  Social Connections: Not on file  Intimate Partner Violence: Not on file    Family History: Family History  Problem Relation Age of Onset   Pancreatitis Brother        alcoholic  pancreatitis   Pancreatic cancer Neg Hx    Colon cancer Neg Hx     Current Medications:  Current Outpatient Medications:    acetaminophen (TYLENOL) 500 MG tablet, Take 2 tablets (1,000 mg total) by mouth every 8 (eight) hours as needed for moderate pain, headache or fever. (Patient taking differently: Take 750-1,000 mg by mouth every 8 (eight) hours as needed for moderate pain, headache or fever.), Disp: , Rfl:    Aromatic Inhalants (VICKS VAPOINHALER) INHA, Inhale 1 puff into the lungs daily as needed (congestion)., Disp: , Rfl:    buPROPion (WELLBUTRIN XL) 300 MG 24 hr tablet, Take 300 mg by mouth daily after breakfast., Disp: , Rfl:    busPIRone (BUSPAR) 10 MG tablet, Take 20 mg by mouth 2 (two) times daily., Disp: , Rfl:    calcium carbonate (TUMS - DOSED IN MG ELEMENTAL CALCIUM) 500 MG chewable tablet, Chew 1 tablet (200 mg of elemental calcium total) by mouth 2 (two) times daily as needed for indigestion or heartburn. (Patient taking differently: Chew 1-2 tablets by mouth 2 (two) times daily as needed for indigestion or heartburn.), Disp: , Rfl:    capecitabine (XELODA) 500 MG tablet, Take 3 tablets (1,500 mg total) by mouth 2 (two) times daily after a meal. Take Monday- Friday. Take only on days of radiation., Disp: 180 tablet, Rfl: 0   Cholecalciferol (VITAMIN D3) 125 MCG (5000 UT) CAPS, Take 5,000 Units by mouth every other day., Disp: , Rfl:    escitalopram (LEXAPRO) 20 MG tablet, Take 20 mg by mouth at bedtime., Disp: , Rfl:    fluorouracil CALGB 25956 1,920 mg/m2 in sodium chloride 0.9 % 150 mL, Inject 1,920 mg/m2 into the vein over 48 hr., Disp: , Rfl:    lactose free nutrition (BOOST) LIQD, Take 237 mLs by mouth 2 (two) times daily between meals., Disp: , Rfl:    lactulose (CHRONULAC) 10 GM/15ML solution, Take 15 mLs (10 g total) by mouth at bedtime. Take 15 ml at bedtime every night to assist with bowel movements. If bowel movement has not occurred in 3-4 days take 15 ml every 3  hours until bowel movement has occurred., Disp: 236 mL, Rfl: 0   lidocaine-prilocaine (EMLA) cream, Apply a small amount to port a cath site (do not rub in) and cover with plastic wrap 1 hour prior to infusion appointments, Disp: 30 g, Rfl: 3   linaclotide (LINZESS) 72 MCG capsule, Take 1 capsule (72 mcg total) by mouth daily before breakfast., Disp: 30 capsule, Rfl: 12   methocarbamol (ROBAXIN) 500 MG tablet, Take 1 tablet (500 mg total) by  mouth every 8 (eight) hours as needed for muscle spasms., Disp: 15 tablet, Rfl: 0   metoprolol succinate (TOPROL-XL) 25 MG 24 hr tablet, Take 25 mg by mouth daily after breakfast., Disp: , Rfl:    mirtazapine (REMERON) 30 MG tablet, Take 30 mg by mouth at bedtime., Disp: , Rfl:    omeprazole (PRILOSEC) 40 MG capsule, Take 1 capsule (40 mg total) by mouth daily., Disp: 30 capsule, Rfl: 12   ondansetron (ZOFRAN-ODT) 8 MG disintegrating tablet, Take 1 tablet (8 mg total) by mouth every 8 (eight) hours as needed for nausea or vomiting., Disp: 30 tablet, Rfl: 0   Polyethyl Glycol-Propyl Glycol (SYSTANE OP), Place 1 drop into both eyes at bedtime., Disp: , Rfl:    QUEtiapine (SEROQUEL) 25 MG tablet, Take 25 mg by mouth 3 (three) times daily after meals., Disp: , Rfl:    vitamin B-12 (CYANOCOBALAMIN) 500 MCG tablet, Take 1 tablet (500 mcg total) by mouth daily., Disp: 30 tablet, Rfl: 2   Allergies: Allergies  Allergen Reactions   Other Hives and Other (See Comments)    Cherry wood just cut- "smelled it and broke out"; cannot tolerate ANY cherry fragrances, either      Cherry Hives   Wound Dressing Adhesive Rash and Other (See Comments)    Band-Aids = local reaction    REVIEW OF SYSTEMS:   Review of Systems  Constitutional:  Negative for chills, fatigue and fever.  HENT:   Negative for lump/mass, mouth sores, nosebleeds, sore throat and trouble swallowing.   Eyes:  Negative for eye problems.  Respiratory:  Negative for cough and shortness of breath.    Cardiovascular:  Negative for chest pain, leg swelling and palpitations.  Gastrointestinal:  Negative for abdominal pain, constipation, diarrhea, nausea and vomiting.  Genitourinary:  Negative for bladder incontinence, difficulty urinating, dysuria, frequency, hematuria and nocturia.   Musculoskeletal:  Negative for arthralgias, back pain, flank pain, myalgias and neck pain.  Skin:  Negative for itching and rash.  Neurological:  Negative for dizziness, headaches and numbness.  Hematological:  Does not bruise/bleed easily.  Psychiatric/Behavioral:  Negative for depression, sleep disturbance and suicidal ideas. The patient is not nervous/anxious.   All other systems reviewed and are negative.    VITALS:   There were no vitals taken for this visit.  Wt Readings from Last 3 Encounters:  04/06/22 125 lb 9.6 oz (57 kg)  03/01/22 122 lb 14.4 oz (55.7 kg)  02/15/22 125 lb (56.7 kg)    There is no height or weight on file to calculate BMI.  Performance status (ECOG): 1 - Symptomatic but completely ambulatory  PHYSICAL EXAM:   Physical Exam Vitals and nursing note reviewed. Exam conducted with a chaperone present.  Constitutional:      Appearance: Normal appearance.  Cardiovascular:     Rate and Rhythm: Normal rate and regular rhythm.     Pulses: Normal pulses.     Heart sounds: Normal heart sounds.  Pulmonary:     Effort: Pulmonary effort is normal.     Breath sounds: Normal breath sounds.  Abdominal:     Palpations: Abdomen is soft. There is no hepatomegaly, splenomegaly or mass.     Tenderness: There is no abdominal tenderness.  Musculoskeletal:     Right lower leg: No edema.     Left lower leg: No edema.  Lymphadenopathy:     Cervical: No cervical adenopathy.     Right cervical: No superficial, deep or posterior cervical adenopathy.  Left cervical: No superficial, deep or posterior cervical adenopathy.     Upper Body:     Right upper body: No supraclavicular or axillary  adenopathy.     Left upper body: No supraclavicular or axillary adenopathy.  Neurological:     General: No focal deficit present.     Mental Status: She is alert and oriented to person, place, and time.  Psychiatric:        Mood and Affect: Mood normal.        Behavior: Behavior normal.     LABS:      Latest Ref Rng & Units 04/06/2022    3:03 PM 02/16/2022    6:03 AM 02/15/2022    8:55 AM  CBC  WBC 4.0 - 10.5 K/uL 3.6  6.9    Hemoglobin 12.0 - 15.0 g/dL 11.1  9.6  9.9   Hematocrit 36.0 - 46.0 % 33.5  28.5  29.0   Platelets 150 - 400 K/uL 204  183        Latest Ref Rng & Units 04/06/2022    3:03 PM 02/16/2022    6:03 AM 02/15/2022    8:55 AM  CMP  Glucose 70 - 99 mg/dL 111  103    BUN 8 - 23 mg/dL 34  23    Creatinine 0.44 - 1.00 mg/dL 0.79  0.76    Sodium 135 - 145 mmol/L 137  135  140   Potassium 3.5 - 5.1 mmol/L 4.2  4.2  4.2   Chloride 98 - 111 mmol/L 105  103    CO2 22 - 32 mmol/L 24  25    Calcium 8.9 - 10.3 mg/dL 8.6  8.1    Total Protein 6.5 - 8.1 g/dL 6.7  5.1    Total Bilirubin 0.3 - 1.2 mg/dL 0.5  0.3    Alkaline Phos 38 - 126 U/L 111  92    AST 15 - 41 U/L 25  63    ALT 0 - 44 U/L 22  68       No results found for: "CEA1", "CEA" / No results found for: "CEA1", "CEA" No results found for: "PSA1" Lab Results  Component Value Date   [id number] 10 01/12/2022   No results found for: "CAN125"  No results found for: "TOTALPROTELP", "ALBUMINELP", "A1GS", "A2GS", "BETS", "BETA2SER", "GAMS", "MSPIKE", "SPEI" Lab Results  Component Value Date   TIBC 154 (L) 07/14/2021   FERRITIN 273 07/14/2021   IRONPCTSAT 11 07/14/2021   No results found for: "LDH"   STUDIES:   No results found.

## 2022-04-06 NOTE — Patient Instructions (Signed)
[facility]  Discharge Instructions  You were seen and examined today by Dr. Delton Coombes.  Dr. Delton Coombes discussed your most recent lab work which revealed that everything looks good.  Follow-up as scheduled.    Thank you for choosing [facility] to provide your oncology and hematology care.   To afford each patient quality time with our provider, please arrive at least 15 minutes before your scheduled appointment time. You may need to reschedule your appointment if you arrive late (10 or more minutes). Arriving late affects you and other patients whose appointments are after yours.  Also, if you miss three or more appointments without notifying the office, you may be dismissed from the clinic at the provider's discretion.    Again, thank you for choosing [facility].  Our hope is that these requests will decrease the amount of time that you wait before being seen by our physicians.   If you have a lab appointment with the [facility] please come in thru the Main Entrance and check in at the main information desk.           _____________________________________________________________  Should you have questions after your visit to [facility], please contact our office at [phone] and follow the prompts.  Our office hours are 8:00 a.m. to 4:30 p.m. Monday - Thursday and 8:00 a.m. to 2:30 p.m. Friday.  Please note that voicemails left after 4:00 p.m. may not be returned until the following business day.  We are closed weekends and all major holidays.  You do have access to a nurse 24-7, just call the main number to the clinic [phone] and do not press any options, hold on the line and a nurse will answer the phone.    For prescription refill requests, have your pharmacy contact our office and allow 72 hours.    Masks are optional in the cancer centers. If you would like for your care team to wear a  mask while they are taking care of you, please let them know. You may have one support person who is at least 79 years old accompany you for your appointments.

## 2022-04-06 NOTE — Progress Notes (Signed)
Darrol Jump presented for Portacath access and flush with lab work.   Portacath located right chest wall accessed with  H 20 needle.  Good blood return present. Portacath flushed with 39m NS and 500U/573mHeparin and needle removed intact.  Procedure tolerated well and without incident. No complaints at this time. Discharged from clinic ambulatory in stable condition. Alert and oriented x 3. [facility]

## 2022-04-08 ENCOUNTER — Other Ambulatory Visit (HOSPITAL_COMMUNITY): Payer: Self-pay

## 2022-04-12 NOTE — Progress Notes (Signed)
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 - EGD/EUS (07/09/2021) by Dr. Rush Landmark: The region where the PD is strictured, darker appearance was identified.  There was no clear hypoechoic mass.  Endosonographic appearance suggestive of either chronic pancreatitis changes versus possible malignancy.  Endosonographic staging T1 N0 MX.  Pancreatic parenchymal abnormalities consisting of atrophy and hyperechoic strands.  No sign of significant pathology in the CBD.  No malignant appearing lymph nodes in the celiac region, peripancreatic region and porta hepatic region - Pathology (pancreatic duct FNA): Malignant cells consistent with adenocarcinoma - CA 19-9: 31 (0-35) - CTAP (07/13/2021): Fluid collection extending posterior to the gastric antrum and ventral to the pancreas.  Elongated collection extends to the greater curvature of the stomach with inflammation surrounding the collection suggestive of a pancreatic pseudocyst.  Chronic dilatation of pancreatic duct with abrupt termination of the duct dilatation in the head of the pancreas. - CT chest (07/16/2021): No evidence of metastatic disease in the chest. - 8 to 9 pound weight loss in the last 3 months, decreased appetite.       - PET scan (07/30/2021): Faint focus of increased metabolic activity, SUV 3.6, measuring 9 mm at the junction of the pancreatic head and body, suspicious for pancreatic  adenocarcinoma.  No obvious extrapancreatic spread of tumor.  Cystic lesion along the posterior margin of the stomach and anterior margin of the pancreatic head favored pseudocyst.  Small amount of pelvic ascites. - MRI of the abdomen pancreatic protocol (08/03/2021): Similar appearance of the pancreas, with abrupt transition from dilated to decompressed duct in the pancreatic head with no evidence of underlying mass.  Pseudocyst enlarged position between pancreatic head and GE junction with significant mass effect upon the pyloric region and gastric outlet obstruction radiologically.  No evidence of abdominal metastatic disease. - FOLFOX on 09/16/2021, FOLFIRINOX cycle 2 on 09/30/2021 - 02/15/2022: Staging laparoscopy, exploratory laparotomy.  Whipple's aborted due to extensive thick scarring with obliteration of tissue planes preventing safe dissection of SMV. - XRT with Xeloda started on 04/05/2022    Social/family history: - She is accompanied by her son [name] today.  She lives at home by herself.  She is independent of ADLs and IADLs.  She did office work prior to retirement.  Non-smoker and nonalcoholic. - Brother (alcoholic) and maternal half uncle had pancreatitis.  No history of malignancy.  3. Pulmonary embolism: - She was diagnosed with pulmonary embolism in January 2023 and then will hospital.  There does not appear to be any provoking factors for PE.  Reportedly Doppler was negative for DVT.  She was treated with Eliquis for 60 days, discontinued after that.  Plan: Stage I (T1 N0 MX) pancreatic adenocarcinoma: - XRT started on 04/05/2022, for a total of 6 weeks. - She is taking Xeloda 3 tablets twice daily. - She denies any GI side effects including nausea, vomiting, diarrhea or constipation. - She reports tiredness which started on 04/10/2022.  I have encouraged her to be physically  active to improve on the tiredness. - She has reported soreness in the right inner upper lip.  I have  recommended that she use a mouthwash with 1 quart of water and 1 teaspoon of baking soda and salt and rinse her mouth 3-4 times daily. - We reviewed labs today which showed normal electrolytes and renal function.  LFTs are normal.  CBC was grossly normal with mild anemia with hemoglobin 11.1. - Continue Xeloda 1500 mg twice daily Monday through Friday.  RTC 1 week for follow-up with repeat labs.   2.  Weight loss: - She was encouraged to eat multiple small meals per day. - She has gained 3 pounds since last week.  Orders Placed This Encounter  Procedures   CBC with Differential/Platelet    Standing Status:   Future    Standing Expiration Date:   04/13/2023    Order Specific Question:   Release to patient    Answer:   Immediate   Comprehensive metabolic panel    Standing Status:   Future    Standing Expiration Date:   04/13/2023    Order Specific Question:   Release to patient    Answer:   Immediate   Magnesium    Standing Status:   Future    Standing Expiration Date:   04/13/2023    Order Specific Question:   Release to patient    Answer:   Immediate    I,[name],acting as a scribe for [name], MD.,have documented all relevant documentation on the behalf of [name], MD,as directed by  [name], MD while in the presence of [name], MD.  I, [name] MD, have reviewed the above documentation for accuracy and completeness, and I agree with the above.   [name], MD   3/19/20244:17 PM  CHIEF COMPLAINT:   Diagnosis: pancreatic cancer    Cancer Staging  Malignant neoplasm of pancreas [facility]) Staging form: Exocrine Pancreas, AJCC 8th Edition - Clinical stage from 07/29/2021: Stage IA (cT1, cN0, cM0) - Unsigned    Prior Therapy: FOLFIRINOX  Current Therapy: Xeloda and radiation   HISTORY OF PRESENT ILLNESS:   Oncology History  Malignant neoplasm of pancreas ([place])  07/21/2021 Initial Diagnosis   Malignant  neoplasm of pancreas ([place])   09/16/2021 -  Chemotherapy   Patient is on Treatment Plan : PANCREAS Modified FOLFIRINOX q14d x 4 cycles      Genetic Testing   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 VUS in ATM called c.4367G>A and in [facility] called c.238G>A also identified. The remainder of testing was negative/normal. The report date is 09/20/2021.  The CancerNext-Expanded + RNAinsight gene panel offered by [employer] and includes sequencing and rearrangement analysis for the following 77 genes: IP, ALK, APC*, ATM*, AXIN2, BAP1, BARD1, BLM, BMPR1A, BRCA1*, BRCA2*, BRIP1*, CDC73, CDH1*,CDK4, CDKN1B, CDKN2A, CHEK2*, CTNNA1, DICER1, FANCC, FH, FLCN, GALNT12, KIF1B, LZTR1, MAX, MEN1, MET, MLH1*, MSH2*, MSH3, MSH6*, MUTYH*, NBN, NF1*, NF2, NTHL1, PALB2*, PHOX2B, PMS2*, POT1, PRKAR1A, PTCH1, PTEN*, RAD51C*, RAD51D*,RB1, RECQL, RET, SDHA, SDHAF2, SDHB, SDHC, SDHD, SMAD4, SMARCA4, SMARCB1, SMARCE1, STK11, SUFU, TMEM127, TP53*,TSC1, TSC2, VHL and XRCC2 (sequencing and deletion/duplication); EGFR, EGLN1, HOXB13, KIT, MITF, PDGFRA, POLD1 and POLE (sequencing only); EPCAM and GREM1 (deletion/duplication only).      INTERVAL HISTORY:   Barbette is a 77 y.o. female presenting to clinic  today for follow up of pancreatic cancer. She was last seen by me on 04/06/22.  Today, she states that she is doing well overall. Her appetite level is at 75%. Her energy level is at 10%.  Denies any nausea, vomiting, diarrhea or constipation.  Feels tired for the last 3 days.  PAST MEDICAL HISTORY:   Past Medical History: Past Medical History:  Diagnosis Date   Anxiety    Arthritis    Depression    Dysrhythmia    hx palpitations greater than 5 yrs -neg echo, stress per pt ? where or dr   GERD (gastroesophageal reflux disease)     occ   Nausea & vomiting 08/24/2021   Pancreatic adenocarcinoma ([place])    Pancreatic pseudocyst    Port-A-Cath in place 09/15/2021   Pulmonary embolism [facility])    September 2022    Surgical History: Past Surgical History:  Procedure Laterality Date   ANTERIOR LAT LUMBAR FUSION Right 12/16/2015   Procedure: RIGHT LUMBAR TWO-THREE, LUMBAR THREE-FOUR, LUMBAR FOUR-FIVE ANTEROLATERAL LUMBAR INTERBODY FUSION;  Surgeon: Erline Levine, MD;  Location: [place];  Service: Neurosurgery;  Laterality: Right;   BALLOON DILATION N/A 08/27/2021   Procedure: BALLOON DILATION;  Surgeon: Rush Landmark Telford Nab., MD;  Location: [facility];  Service: Gastroenterology;  Laterality: N/A;   BIOPSY  07/09/2021   Procedure: BIOPSY;  Surgeon: Rush Landmark Telford Nab., MD;  Location: [place];  Service: Gastroenterology;;   CYST GASTROSTOMY  08/27/2021   Procedure: CYST GASTROSTOMY;  Surgeon: Irving Copas., MD;  Location: [facility];  Service: Gastroenterology;;   ESOPHAGOGASTRODUODENOSCOPY N/A 07/09/2021   Procedure: ESOPHAGOGASTRODUODENOSCOPY (EGD);  Surgeon: Irving Copas., MD;  Location: [place];  Service: Gastroenterology;  Laterality: N/A;   ESOPHAGOGASTRODUODENOSCOPY (EGD) WITH PROPOFOL N/A 08/27/2021   Procedure: ESOPHAGOGASTRODUODENOSCOPY (EGD) WITH PROPOFOL;  Surgeon: Rush Landmark Telford Nab., MD;  Location: [facility];  Service: Gastroenterology;  Laterality: N/A;   ESOPHAGOGASTRODUODENOSCOPY (EGD) WITH PROPOFOL N/A 10/08/2021   Procedure: ESOPHAGOGASTRODUODENOSCOPY (EGD) WITH PROPOFOL;  Surgeon: Rush Landmark Telford Nab., MD;  Location: [facility];  Service: Gastroenterology;  Laterality: N/A;   EUS N/A 07/09/2021   Procedure: UPPER ENDOSCOPIC ULTRASOUND (EUS) RADIAL;  Surgeon: Irving Copas., MD;  Location: [place];  Service: Gastroenterology;  Laterality: N/A;   EUS N/A 08/27/2021   Procedure: UPPER ENDOSCOPIC ULTRASOUND (EUS) LINEAR;  Surgeon: Irving Copas., MD;   Location: [facility];  Service: Gastroenterology;  Laterality: N/A;   FINE NEEDLE ASPIRATION  07/09/2021   Procedure: FINE NEEDLE ASPIRATION (FNA) LINEAR;  Surgeon: Irving Copas., MD;  Location: [facility];  Service: Gastroenterology;;   IR IMAGING GUIDED PORT INSERTION  09/14/2021   LAPAROSCOPY N/A 02/15/2022   Procedure: STAGING DIAGNOSTIC;  Surgeon: Dwan Bolt, MD;  Location: [place];  Service: General;  Laterality: N/A;   LUMBAR PERCUTANEOUS PEDICLE SCREW 3 LEVEL Bilateral 12/16/2015   Procedure: PERCUTANEOUS PEDICLE SCREWS BILATERALLY AT LUMBAR TWO-FIVE;  Surgeon: Erline Levine, MD;  Location: [place];  Service: Neurosurgery;  Laterality: Bilateral;   PANCREATIC STENT PLACEMENT  08/27/2021   Procedure: PANCREATIC STENT PLACEMENT;  Surgeon: Irving Copas., MD;  Location: [facility];  Service: Gastroenterology;;   Lavell Islam REMOVAL  10/08/2021   Procedure: STENT REMOVAL;  Surgeon: Irving Copas., MD;  Location: [facility];  Service: Gastroenterology;;   TUBAL LIGATION  1974   WHIPPLE PROCEDURE N/A 02/15/2022   Procedure: ATTEMPTED WHIPPLE PROCEDURE;  Surgeon: Dwan Bolt, MD;  Location: [place];  Service: General;  Laterality: N/A;    Social  History: Social History   Socioeconomic History   Marital status: Widowed    Spouse name: Not on file   Number of children: Not on file   Years of education: Not on file   Highest education level: Not on file  Occupational History   Not on file  Tobacco Use   Smoking status: Never   Smokeless tobacco: Never  Vaping Use   Vaping Use: Never used  Substance and Sexual Activity   Alcohol use: No   Drug use: Never   Sexual activity: Not on file  Other Topics Concern   Not on file  Social History Narrative   Not on file   Social Determinants of Health   Financial Resource Strain: Not on file  Food Insecurity: Not on file  Transportation Needs: Not on file  Physical Activity: Not on file  Stress: Not on file   Social Connections: Not on file  Intimate Partner Violence: Not on file    Family History: Family History  Problem Relation Age of Onset   Pancreatitis Brother        alcoholic pancreatitis   Pancreatic cancer Neg Hx    Colon cancer Neg Hx     Current Medications:  Current Outpatient Medications:    acetaminophen (TYLENOL) 500 MG tablet, Take 2 tablets (1,000 mg total) by mouth every 8 (eight) hours as needed for moderate pain, headache or fever. (Patient taking differently: Take 750-1,000 mg by mouth every 8 (eight) hours as needed for moderate pain, headache or fever.), Disp: , Rfl:    Aromatic Inhalants (VICKS VAPOINHALER) INHA, Inhale 1 puff into the lungs daily as needed (congestion)., Disp: , Rfl:    buPROPion (WELLBUTRIN XL) 300 MG 24 hr tablet, Take 300 mg by mouth daily after breakfast., Disp: , Rfl:    busPIRone (BUSPAR) 10 MG tablet, Take 20 mg by mouth 2 (two) times daily., Disp: , Rfl:    calcium carbonate (TUMS - DOSED IN MG ELEMENTAL CALCIUM) 500 MG chewable tablet, Chew 1 tablet (200 mg of elemental calcium total) by mouth 2 (two) times daily as needed for indigestion or heartburn. (Patient taking differently: Chew 1-2 tablets by mouth 2 (two) times daily as needed for indigestion or heartburn.), Disp: , Rfl:    capecitabine (XELODA) 500 MG tablet, Take 3 tablets (1,500 mg total) by mouth 2 (two) times daily after a meal. Take Monday- Friday. Take only on days of radiation., Disp: 180 tablet, Rfl: 0   Cholecalciferol (VITAMIN D3) 125 MCG (5000 UT) CAPS, Take 5,000 Units by mouth every other day., Disp: , Rfl:    escitalopram (LEXAPRO) 20 MG tablet, Take 20 mg by mouth at bedtime., Disp: , Rfl:    fluorouracil CALGB 57846 1,920 mg/m2 in sodium chloride 0.9 % 150 mL, Inject 1,920 mg/m2 into the vein over 48 hr., Disp: , Rfl:    lactose free nutrition (BOOST) LIQD, Take 237 mLs by mouth 2 (two) times daily between meals., Disp: , Rfl:    lactulose (CHRONULAC) 10 GM/15ML  solution, Take 15 mLs (10 g total) by mouth at bedtime. Take 15 ml at bedtime every night to assist with bowel movements. If bowel movement has not occurred in 3-4 days take 15 ml every 3 hours until bowel movement has occurred., Disp: 236 mL, Rfl: 0   lidocaine-prilocaine (EMLA) cream, Apply a small amount to port a cath site (do not rub in) and cover with plastic wrap 1 hour prior to infusion appointments, Disp: 30 g, Rfl:  3   linaclotide (LINZESS) 72 MCG capsule, Take 1 capsule (72 mcg total) by mouth daily before breakfast., Disp: 30 capsule, Rfl: 12   methocarbamol (ROBAXIN) 500 MG tablet, Take 1 tablet (500 mg total) by mouth every 8 (eight) hours as needed for muscle spasms., Disp: 15 tablet, Rfl: 0   metoprolol succinate (TOPROL-XL) 25 MG 24 hr tablet, Take 25 mg by mouth daily after breakfast., Disp: , Rfl:    mirtazapine (REMERON) 30 MG tablet, Take 30 mg by mouth at bedtime., Disp: , Rfl:    omeprazole (PRILOSEC) 40 MG capsule, Take 1 capsule (40 mg total) by mouth daily., Disp: 30 capsule, Rfl: 12   ondansetron (ZOFRAN-ODT) 8 MG disintegrating tablet, Take 1 tablet (8 mg total) by mouth every 8 (eight) hours as needed for nausea or vomiting., Disp: 30 tablet, Rfl: 0   Polyethyl Glycol-Propyl Glycol (SYSTANE OP), Place 1 drop into both eyes at bedtime., Disp: , Rfl:    QUEtiapine (SEROQUEL) 25 MG tablet, Take 25 mg by mouth 3 (three) times daily after meals., Disp: , Rfl:    vitamin B-12 (CYANOCOBALAMIN) 500 MCG tablet, Take 1 tablet (500 mcg total) by mouth daily., Disp: 30 tablet, Rfl: 2   Allergies: Allergies  Allergen Reactions   Other Hives and Other (See Comments)    Cherry wood just cut- "smelled it and broke out"; cannot tolerate ANY cherry fragrances, either      Cherry Hives   Wound Dressing Adhesive Rash and Other (See Comments)    Band-Aids = local reaction    REVIEW OF SYSTEMS:   Review of Systems  Constitutional:  Positive for fatigue. Negative for chills and  fever.  HENT:   Negative for lump/mass, mouth sores, nosebleeds, sore throat and trouble swallowing.   Eyes:  Negative for eye problems.  Respiratory:  Negative for cough and shortness of breath.   Cardiovascular:  Negative for chest pain, leg swelling and palpitations.  Gastrointestinal:  Negative for abdominal pain, constipation, diarrhea, nausea and vomiting.  Genitourinary:  Negative for bladder incontinence, difficulty urinating, dysuria, frequency, hematuria and nocturia.   Musculoskeletal:  Negative for arthralgias, back pain, flank pain, myalgias and neck pain.  Skin:  Negative for itching and rash.  Neurological:  Negative for dizziness, headaches and numbness.  Hematological:  Does not bruise/bleed easily.  Psychiatric/Behavioral:  Negative for depression, sleep disturbance and suicidal ideas. The patient is not nervous/anxious.   All other systems reviewed and are negative.    VITALS:   There were no vitals taken for this visit.  Wt Readings from Last 3 Encounters:  04/13/22 128 lb 9.6 oz (58.3 kg)  04/06/22 125 lb 9.6 oz (57 kg)  03/01/22 122 lb 14.4 oz (55.7 kg)    There is no height or weight on file to calculate BMI.  Performance status (ECOG): 1 - Symptomatic but completely ambulatory  PHYSICAL EXAM:   Physical Exam Vitals and nursing note reviewed. Exam conducted with a chaperone present.  Constitutional:      Appearance: Normal appearance.  Cardiovascular:     Rate and Rhythm: Normal rate and regular rhythm.     Pulses: Normal pulses.     Heart sounds: Normal heart sounds.  Pulmonary:     Effort: Pulmonary effort is normal.     Breath sounds: Normal breath sounds.  Abdominal:     Palpations: Abdomen is soft. There is no hepatomegaly, splenomegaly or mass.     Tenderness: There is no abdominal tenderness.  Musculoskeletal:  Right lower leg: No edema.     Left lower leg: No edema.  Lymphadenopathy:     Cervical: No cervical adenopathy.     Right  cervical: No superficial, deep or posterior cervical adenopathy.    Left cervical: No superficial, deep or posterior cervical adenopathy.     Upper Body:     Right upper body: No supraclavicular or axillary adenopathy.     Left upper body: No supraclavicular or axillary adenopathy.  Neurological:     General: No focal deficit present.     Mental Status: She is alert and oriented to person, place, and time.  Psychiatric:        Mood and Affect: Mood normal.        Behavior: Behavior normal.     LABS:      Latest Ref Rng & Units 04/13/2022    1:16 PM 04/06/2022    3:03 PM 02/16/2022    6:03 AM  CBC  WBC 4.0 - 10.5 K/uL 4.6  3.6  6.9   Hemoglobin 12.0 - 15.0 g/dL 11.1  11.1  9.6   Hematocrit 36.0 - 46.0 % 33.5  33.5  28.5   Platelets 150 - 400 K/uL 215  204  183       Latest Ref Rng & Units 04/13/2022    1:16 PM 04/06/2022    3:03 PM 02/16/2022    6:03 AM  CMP  Glucose 70 - 99 mg/dL 110  111  103   BUN 8 - 23 mg/dL 32  34  23   Creatinine 0.44 - 1.00 mg/dL 0.72  0.79  0.76   Sodium 135 - 145 mmol/L 135  137  135   Potassium 3.5 - 5.1 mmol/L 4.2  4.2  4.2   Chloride 98 - 111 mmol/L 105  105  103   CO2 22 - 32 mmol/L 23  24  25    Calcium 8.9 - 10.3 mg/dL 8.6  8.6  8.1   Total Protein 6.5 - 8.1 g/dL 6.7  6.7  5.1   Total Bilirubin 0.3 - 1.2 mg/dL 0.5  0.5  0.3   Alkaline Phos 38 - 126 U/L 106  111  92   AST 15 - 41 U/L 24  25  63   ALT 0 - 44 U/L 17  22  68      No results found for: "CEA1", "CEA" / No results found for: "CEA1", "CEA" No results found for: "PSA1" Lab Results  Component Value Date   [id number] 10 01/12/2022   No results found for: "CAN125"  No results found for: "TOTALPROTELP", "ALBUMINELP", "A1GS", "A2GS", "BETS", "BETA2SER", "GAMS", "MSPIKE", "SPEI" Lab Results  Component Value Date   TIBC 154 (L) 07/14/2021   FERRITIN 273 07/14/2021   IRONPCTSAT 11 07/14/2021   No results found for: "LDH"   STUDIES:   No results found.

## 2022-04-13 ENCOUNTER — Inpatient Hospital Stay: Payer: Medicare Other

## 2022-04-13 ENCOUNTER — Inpatient Hospital Stay: Payer: Medicare Other | Admitting: Hematology

## 2022-04-13 ENCOUNTER — Encounter: Payer: Self-pay | Admitting: Hematology

## 2022-04-13 VITALS — BP 118/65 | HR 72 | Temp 98.6°F | Resp 20 | Ht 63.39 in | Wt 128.6 lb

## 2022-04-13 DIAGNOSIS — C250 Malignant neoplasm of head of pancreas: Secondary | ICD-10-CM

## 2022-04-13 DIAGNOSIS — Z95828 Presence of other vascular implants and grafts: Secondary | ICD-10-CM

## 2022-04-13 DIAGNOSIS — C253 Malignant neoplasm of pancreatic duct: Secondary | ICD-10-CM | POA: Diagnosis not present

## 2022-04-13 LAB — CBC WITH DIFFERENTIAL/PLATELET
Abs Immature Granulocytes: 0.02 10*3/uL (ref 0.00–0.07)
Basophils Absolute: 0 10*3/uL (ref 0.0–0.1)
Basophils Relative: 0 %
Eosinophils Absolute: 0.1 10*3/uL (ref 0.0–0.5)
Eosinophils Relative: 2 %
HCT: 33.5 % — ABNORMAL LOW (ref 36.0–46.0)
Hemoglobin: 11.1 g/dL — ABNORMAL LOW (ref 12.0–15.0)
Immature Granulocytes: 0 %
Lymphocytes Relative: 18 %
Lymphs Abs: 0.8 10*3/uL (ref 0.7–4.0)
MCH: 32.9 pg (ref 26.0–34.0)
MCHC: 33.1 g/dL (ref 30.0–36.0)
MCV: 99.4 fL (ref 80.0–100.0)
Monocytes Absolute: 0.4 10*3/uL (ref 0.1–1.0)
Monocytes Relative: 9 %
Neutro Abs: 3.2 10*3/uL (ref 1.7–7.7)
Neutrophils Relative %: 71 %
Platelets: 215 10*3/uL (ref 150–400)
RBC: 3.37 MIL/uL — ABNORMAL LOW (ref 3.87–5.11)
RDW: 13 % (ref 11.5–15.5)
WBC: 4.6 10*3/uL (ref 4.0–10.5)
nRBC: 0 % (ref 0.0–0.2)

## 2022-04-13 LAB — COMPREHENSIVE METABOLIC PANEL
ALT: 17 U/L (ref 0–44)
AST: 24 U/L (ref 15–41)
Albumin: 3.6 g/dL (ref 3.5–5.0)
Alkaline Phosphatase: 106 U/L (ref 38–126)
Anion gap: 7 (ref 5–15)
BUN: 32 mg/dL — ABNORMAL HIGH (ref 8–23)
CO2: 23 mmol/L (ref 22–32)
Calcium: 8.6 mg/dL — ABNORMAL LOW (ref 8.9–10.3)
Chloride: 105 mmol/L (ref 98–111)
Creatinine, Ser: 0.72 mg/dL (ref 0.44–1.00)
GFR, Estimated: >60 mL/min (ref >60)
Glucose, Bld: 110 mg/dL — ABNORMAL HIGH (ref 70–99)
Potassium: 4.2 mmol/L (ref 3.5–5.1)
Sodium: 135 mmol/L (ref 135–145)
Total Bilirubin: 0.5 mg/dL (ref 0.3–1.2)
Total Protein: 6.7 g/dL (ref 6.5–8.1)

## 2022-04-13 LAB — MAGNESIUM: Magnesium: 2.1 mg/dL (ref 1.7–2.4)

## 2022-04-13 MED ORDER — SODIUM CHLORIDE 0.9% FLUSH
10.0000 mL | Freq: Once | INTRAVENOUS | Status: AC
  Administered 2022-04-13: 10 mL via INTRAVENOUS

## 2022-04-13 MED ORDER — HEPARIN SOD (PORK) LOCK FLUSH 100 UNIT/ML IV SOLN
500.0000 [IU] | Freq: Once | INTRAVENOUS | Status: AC
  Administered 2022-04-13: 500 [IU] via INTRAVENOUS

## 2022-04-13 NOTE — Progress Notes (Signed)
Port flushed with good blood return noted. No bruising or swelling at site. Bandaid applied and patient discharged in satisfactory condition. VVS stable with no signs or symptoms of distressed noted. 

## 2022-04-13 NOTE — Patient Instructions (Addendum)
[facility]  Discharge Instructions  You were seen and examined today by Dr. Delton Coombes.  Dr. Delton Coombes discussed your most recent lab work which revealed that everything looks good.  Mix a quart of water with one teaspoon of salt and one teaspoon of baking soda and swish and spit it every 2 hours daily at least 3 times daily.  Follow-up as scheduled in 1 week.    Thank you for choosing [facility] to provide your oncology and hematology care.   To afford each patient quality time with our provider, please arrive at least 15 minutes before your scheduled appointment time. You may need to reschedule your appointment if you arrive late (10 or more minutes). Arriving late affects you and other patients whose appointments are after yours.  Also, if you miss three or more appointments without notifying the office, you may be dismissed from the clinic at the provider's discretion.    Again, thank you for choosing [facility].  Our hope is that these requests will decrease the amount of time that you wait before being seen by our physicians.   If you have a lab appointment with the [facility] please come in thru the Main Entrance and check in at the main information desk.           _____________________________________________________________  Should you have questions after your visit to [facility], please contact our office at [phone] and follow the prompts.  Our office hours are 8:00 a.m. to 4:30 p.m. Monday - Thursday and 8:00 a.m. to 2:30 p.m. Friday.  Please note that voicemails left after 4:00 p.m. may not be returned until the following business day.  We are closed weekends and all major holidays.  You do have access to a nurse 24-7, just call the main number to the clinic [phone] and do not press any options, hold on the line and a nurse will answer the phone.    For prescription refill  requests, have your pharmacy contact our office and allow 72 hours.    Masks are optional in the cancer centers. If you would like for your care team to wear a mask while they are taking care of you, please let them know. You may have one support person who is at least 79 years old accompany you for your appointments.

## 2022-04-14 ENCOUNTER — Other Ambulatory Visit: Payer: Self-pay | Admitting: *Deleted

## 2022-04-14 ENCOUNTER — Encounter: Payer: Self-pay | Admitting: *Deleted

## 2022-04-14 MED ORDER — GABAPENTIN 100 MG PO CAPS: 100.0000 mg | ORAL_CAPSULE | Freq: Two times a day (BID) | ORAL | 0 refills | Status: DC

## 2022-04-14 NOTE — Telephone Encounter (Signed)
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  Per Dr. Delton Coombes, will send Gabapentin 100 mg bid.  Patient aware.

## 2022-04-19 ENCOUNTER — Other Ambulatory Visit (HOSPITAL_COMMUNITY): Payer: Self-pay

## 2022-04-20 ENCOUNTER — Inpatient Hospital Stay: Payer: Medicare Other | Admitting: Hematology

## 2022-04-20 ENCOUNTER — Inpatient Hospital Stay: Payer: Medicare Other

## 2022-04-20 DIAGNOSIS — C250 Malignant neoplasm of head of pancreas: Secondary | ICD-10-CM

## 2022-04-20 DIAGNOSIS — C253 Malignant neoplasm of pancreatic duct: Secondary | ICD-10-CM | POA: Diagnosis not present

## 2022-04-20 LAB — MAGNESIUM: Magnesium: 2 mg/dL (ref 1.7–2.4)

## 2022-04-20 LAB — COMPREHENSIVE METABOLIC PANEL
ALT: 15 U/L (ref 0–44)
AST: 25 U/L (ref 15–41)
Albumin: 3.4 g/dL — ABNORMAL LOW (ref 3.5–5.0)
Alkaline Phosphatase: 90 U/L (ref 38–126)
Anion gap: 9 (ref 5–15)
BUN: 36 mg/dL — ABNORMAL HIGH (ref 8–23)
CO2: 24 mmol/L (ref 22–32)
Calcium: 8.4 mg/dL — ABNORMAL LOW (ref 8.9–10.3)
Chloride: 103 mmol/L (ref 98–111)
Creatinine, Ser: 0.83 mg/dL (ref 0.44–1.00)
GFR, Estimated: >60 mL/min (ref >60)
Glucose, Bld: 122 mg/dL — ABNORMAL HIGH (ref 70–99)
Potassium: 3.8 mmol/L (ref 3.5–5.1)
Sodium: 136 mmol/L (ref 135–145)
Total Bilirubin: 0.2 mg/dL — ABNORMAL LOW (ref 0.3–1.2)
Total Protein: 6.3 g/dL — ABNORMAL LOW (ref 6.5–8.1)

## 2022-04-20 LAB — CBC WITH DIFFERENTIAL/PLATELET
Abs Immature Granulocytes: 0.01 10*3/uL (ref 0.00–0.07)
Basophils Absolute: 0 10*3/uL (ref 0.0–0.1)
Basophils Relative: 0 %
Eosinophils Absolute: 0.1 10*3/uL (ref 0.0–0.5)
Eosinophils Relative: 2 %
HCT: 33.9 % — ABNORMAL LOW (ref 36.0–46.0)
Hemoglobin: 11.1 g/dL — ABNORMAL LOW (ref 12.0–15.0)
Immature Granulocytes: 0 %
Lymphocytes Relative: 13 %
Lymphs Abs: 0.5 10*3/uL — ABNORMAL LOW (ref 0.7–4.0)
MCH: 32.5 pg (ref 26.0–34.0)
MCHC: 32.7 g/dL (ref 30.0–36.0)
MCV: 99.1 fL (ref 80.0–100.0)
Monocytes Absolute: 0.6 10*3/uL (ref 0.1–1.0)
Monocytes Relative: 14 %
Neutro Abs: 2.9 10*3/uL (ref 1.7–7.7)
Neutrophils Relative %: 71 %
Platelets: 193 10*3/uL (ref 150–400)
RBC: 3.42 MIL/uL — ABNORMAL LOW (ref 3.87–5.11)
RDW: 13.5 % (ref 11.5–15.5)
WBC: 4.2 10*3/uL (ref 4.0–10.5)
nRBC: 0 % (ref 0.0–0.2)

## 2022-04-20 MED ORDER — SODIUM CHLORIDE 0.9% FLUSH
10.0000 mL | Freq: Once | INTRAVENOUS | Status: AC
  Administered 2022-04-20: 10 mL

## 2022-04-20 MED ORDER — HEPARIN SOD (PORK) LOCK FLUSH 100 UNIT/ML IV SOLN
500.0000 [IU] | Freq: Once | INTRAVENOUS | Status: AC
  Administered 2022-04-20: 500 [IU] via INTRAVENOUS

## 2022-04-20 MED ORDER — BETAMETHASONE DIPROPIONATE 0.05 % EX CREA: TOPICAL_CREAM | Freq: Two times a day (BID) | CUTANEOUS | 3 refills | Status: DC

## 2022-04-20 NOTE — Patient Instructions (Addendum)
[facility]  Discharge Instructions  You were seen and examined today by Dr. Delton Coombes.  Dr. Delton Coombes discussed your most recent lab work which revealed that everything looks stable.  Dr. Delton Coombes is decreasing the Xeloda to 1000 mg to twice daily.  Take the Compazine for nausea. Use the Betamethasone cream twice daily.  Follow-up as scheduled in 1 week with labs.    Thank you for choosing [facility] to provide your oncology and hematology care.   To afford each patient quality time with our provider, please arrive at least 15 minutes before your scheduled appointment time. You may need to reschedule your appointment if you arrive late (10 or more minutes). Arriving late affects you and other patients whose appointments are after yours.  Also, if you miss three or more appointments without notifying the office, you may be dismissed from the clinic at the provider's discretion.    Again, thank you for choosing [facility].  Our hope is that these requests will decrease the amount of time that you wait before being seen by our physicians.   If you have a lab appointment with the [facility] please come in thru the Main Entrance and check in at the main information desk.           _____________________________________________________________  Should you have questions after your visit to [facility], please contact our office at [phone] and follow the prompts.  Our office hours are 8:00 a.m. to 4:30 p.m. Monday - Thursday and 8:00 a.m. to 2:30 p.m. Friday.  Please note that voicemails left after 4:00 p.m. may not be returned until the following business day.  We are closed weekends and all major holidays.  You do have access to a nurse 24-7, just call the main number to the clinic [phone] and do not press any options, hold on the line and a nurse will answer the phone.    For prescription  refill requests, have your pharmacy contact our office and allow 72 hours.    Masks are optional in the cancer centers. If you would like for your care team to wear a mask while they are taking care of you, please let them know. You may have one support person who is at least 79 years old accompany you for your appointments.

## 2022-04-20 NOTE — Patient Instructions (Signed)
[facility]  Discharge Instructions: Thank you for choosing [facility] to provide your oncology and hematology care.  If you have a lab appointment with the Cancer Center, please come in thru the Main Entrance and check in at the main information desk.  Wear comfortable clothing and clothing appropriate for easy access to any Portacath or PICC line.   We strive to give you quality time with your provider. You may need to reschedule your appointment if you arrive late (15 or more minutes).  Arriving late affects you and other patients whose appointments are after yours.  Also, if you miss three or more appointments without notifying the office, you may be dismissed from the clinic at the provider's discretion.      For prescription refill requests, have your pharmacy contact our office and allow 72 hours for refills to be completed.    Today you received the following chemotherapy and/or immunotherapy agents port flush with lab work.       To help prevent nausea and vomiting after your treatment, we encourage you to take your nausea medication as directed.  BELOW ARE SYMPTOMS THAT SHOULD BE REPORTED IMMEDIATELY: *FEVER GREATER THAN 100.4 F (38 C) OR HIGHER *CHILLS OR SWEATING *NAUSEA AND VOMITING THAT IS NOT CONTROLLED WITH YOUR NAUSEA MEDICATION *UNUSUAL SHORTNESS OF BREATH *UNUSUAL BRUISING OR BLEEDING *URINARY PROBLEMS (pain or burning when urinating, or frequent urination) *BOWEL PROBLEMS (unusual diarrhea, constipation, pain near the anus) TENDERNESS IN MOUTH AND THROAT WITH OR WITHOUT PRESENCE OF ULCERS (sore throat, sores in mouth, or a toothache) UNUSUAL RASH, SWELLING OR PAIN  UNUSUAL VAGINAL DISCHARGE OR ITCHING   Items with * indicate a potential emergency and should be followed up as soon as possible or go to the Emergency Department if any problems should occur.  Please show the CHEMOTHERAPY ALERT CARD or IMMUNOTHERAPY ALERT CARD at  check-in to the Emergency Department and triage nurse.  Should you have questions after your visit or need to cancel or reschedule your appointment, please contact [facility] [phone]  and follow the prompts.  Office hours are 8:00 a.m. to 4:30 p.m. Monday - Friday. Please note that voicemails left after 4:00 p.m. may not be returned until the following business day.  We are closed weekends and major holidays. You have access to a nurse at all times for urgent questions. Please call the main number to the clinic [phone] and follow the prompts.  For any non-urgent questions, you may also contact your provider using MyChart. We now offer e-Visits for anyone 18 and older to request care online for non-urgent symptoms. For details visit [email or web address].   Also download the MyChart app! Go to the app store, search "MyChart", open the app, select [facility], and log in with your MyChart username and password.   

## 2022-04-20 NOTE — Progress Notes (Signed)
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 - EGD/EUS (07/09/2021) by Dr. Rush Landmark: The region where the PD is strictured, darker appearance was identified.  There was no clear hypoechoic mass.  Endosonographic appearance suggestive of either chronic pancreatitis changes versus possible malignancy.  Endosonographic staging T1 N0 MX.  Pancreatic parenchymal abnormalities consisting of atrophy and hyperechoic strands.  No sign of significant pathology in the CBD.  No malignant appearing lymph nodes in the celiac region, peripancreatic region and porta hepatic region - Pathology (pancreatic duct FNA): Malignant cells consistent with adenocarcinoma - CA 19-9: 31 (0-35) - CTAP (07/13/2021): Fluid collection extending posterior to the gastric antrum and ventral to the pancreas.  Elongated collection extends to the greater curvature of the stomach with inflammation surrounding the collection suggestive of a pancreatic pseudocyst.  Chronic dilatation of pancreatic duct with abrupt termination of the duct dilatation in the head of the pancreas. - CT chest (07/16/2021): No evidence of metastatic disease in the chest. - 8 to 9 pound weight loss in the last 3 months, decreased appetite.       - PET scan (07/30/2021): Faint focus of increased metabolic activity, SUV 3.6, measuring 9 mm at the junction of the pancreatic head and body, suspicious for pancreatic  adenocarcinoma.  No obvious extrapancreatic spread of tumor.  Cystic lesion along the posterior margin of the stomach and anterior margin of the pancreatic head favored pseudocyst.  Small amount of pelvic ascites. - MRI of the abdomen pancreatic protocol (08/03/2021): Similar appearance of the pancreas, with abrupt transition from dilated to decompressed duct in the pancreatic head with no evidence of underlying mass.  Pseudocyst enlarged position between pancreatic head and GE junction with significant mass effect upon the pyloric region and gastric outlet obstruction radiologically.  No evidence of abdominal metastatic disease. - FOLFOX on 09/16/2021, FOLFIRINOX cycle 2 on 09/30/2021 - 02/15/2022: Staging laparoscopy, exploratory laparotomy.  Whipple's aborted due to extensive thick scarring with obliteration of tissue planes preventing safe dissection of SMV. - XRT with Xeloda started on 04/05/2022    Social/family history: - She is accompanied by her son Sheryl Bender today.  She lives at home by herself.  She is independent of ADLs and IADLs.  She did office work prior to retirement.  Non-smoker and nonalcoholic. - Brother (alcoholic) and maternal half uncle had pancreatitis.  No history of malignancy.  3. Pulmonary embolism: - She was diagnosed with pulmonary embolism in January 2023 and then will hospital.  There does not appear to be any provoking factors for PE.  Reportedly Doppler was negative for DVT.  She was treated with Eliquis for 60 days, discontinued after that.  Plan: Stage I (T1 N0 MX) pancreatic adenocarcinoma: - She is taking Xeloda 3 tablets twice daily.  She mistakenly took Xeloda on last Saturday. - She reported erythema in the feet, right more than left which started few days ago.  She had nausea since Saturday.  She reports some decrease in energy.  She has  occasional dizziness. - Reviewed labs today which shows normal LFTs.  CBC was grossly normal. - Continue Xeloda 1000 mg twice  daily Monday through Friday with radiation.  RTC 1 week for follow-up.   2.  Weight loss: - She is eating multiple small meals per day.  She has gained close to 4 pounds since start of chemo XRT.  3.  Grade 1 HFSR: -She has erythema in the bottom of the feet, right more than left with mild soreness.  No blistering or skin peeling. - Recommend starting betamethasone dipropionate 0.05% to twice daily. - Recommend decreasing Xeloda dose to 1000 mg twice daily.  Orders Placed This Encounter  Procedures   CBC with Differential/Platelet    Standing Status:   Future    Standing Expiration Date:   04/20/2023    Order Specific Question:   Release to patient    Answer:   Immediate   Comprehensive metabolic panel    Standing Status:   Future    Standing Expiration Date:   04/20/2023    Order Specific Question:   Release to patient    Answer:   Immediate   Magnesium    Standing Status:   Future    Standing Expiration Date:   04/20/2023    Order Specific Question:   Release to patient    Answer:   Immediate    I,[name],acting as a scribe for Sheryl Jack, MD.,have documented all relevant documentation on the behalf of Sheryl Jack, MD,as directed by  Sheryl Jack, MD while in the presence of Sheryl Jack, MD.  I, Sheryl Jack MD, have reviewed the above documentation for accuracy and completeness, and I agree with the above.    Sheryl Jack, MD   3/26/20245:54 PM  CHIEF COMPLAINT:   Diagnosis: pancreatic cancer    Cancer Staging  Malignant neoplasm of pancreas [facility]) Staging form: Exocrine Pancreas, AJCC 8th Edition - Clinical stage from 07/29/2021: Stage IA (cT1, cN0, cM0) - Unsigned    Prior Therapy: FOLFIRINOX  Current Therapy: Xeloda and radiation  HISTORY OF PRESENT ILLNESS:   Oncology History  Malignant neoplasm of pancreas ([place])  07/21/2021 Initial Diagnosis   Malignant neoplasm of pancreas ([place])   09/16/2021 -   Chemotherapy   Patient is on Treatment Plan : PANCREAS Modified FOLFIRINOX q14d x 4 cycles      Genetic Testing   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 VUS in ATM called c.4367G>A and in [facility] called c.238G>A also identified. The remainder of testing was negative/normal. The report date is 09/20/2021.  The CancerNext-Expanded + RNAinsight gene panel offered by [employer] and includes sequencing and rearrangement analysis for the following 77 genes: IP, ALK, APC*, ATM*, AXIN2, BAP1, BARD1, BLM, BMPR1A, BRCA1*, BRCA2*, BRIP1*, CDC73, CDH1*,CDK4, CDKN1B, CDKN2A, CHEK2*, CTNNA1, DICER1, FANCC, FH, FLCN, GALNT12, KIF1B, LZTR1, MAX, MEN1, MET, MLH1*, MSH2*, MSH3, MSH6*, MUTYH*, NBN, NF1*, NF2, NTHL1, PALB2*, PHOX2B, PMS2*, POT1, PRKAR1A, PTCH1, PTEN*, RAD51C*, RAD51D*,RB1, RECQL, RET, SDHA, SDHAF2, SDHB, SDHC, SDHD, SMAD4, SMARCA4, SMARCB1, SMARCE1, STK11, SUFU, TMEM127, TP53*,TSC1, TSC2, VHL and XRCC2 (sequencing and deletion/duplication); EGFR, EGLN1, HOXB13, KIT, MITF, PDGFRA, POLD1 and POLE (sequencing only); EPCAM and GREM1 (deletion/duplication only).      INTERVAL HISTORY:   Sheryl Bender is a 77 y.o. female presenting to clinic today for follow up of pancreatic cancer. She was last  seen by me on 04/13/22.  Today, she states that she is doing okay overall. Her appetite level is at 50%. Her energy level is at 40%. She reports swelling and redness on the bottom of her feet R>L. She has been applying Vaseline to her feet in the morning and at night before bed with improvement. She states that the soles of her feet feel sore. She avoids walking around barefoot even in her house. She states that she struggles to trim her toenails and was told that she would need to get a letter in order for her insurance to  cover this being done with podiatry. She reports that her lower lip has been chapped and sore. She is using Chapstick. She states that her mouth is very dry when she wakes up in the middle of the night. She notes a day of nausea last week. She takes Zofran prn. Patient also reports intermittent dizziness, particularly when leaning her head back too far. She tries to stand slowly to avoid feeling lightheaded.  PAST MEDICAL HISTORY:   Past Medical History: Past Medical History:  Diagnosis Date   Anxiety    Arthritis    Depression    Dysrhythmia    hx palpitations greater than 5 yrs -neg echo, stress per pt ? where or dr   GERD (gastroesophageal reflux disease)    occ   Nausea & vomiting 08/24/2021   Pancreatic adenocarcinoma ([place])    Pancreatic pseudocyst    Port-A-Cath in place 09/15/2021   Pulmonary embolism [facility])    September 2022    Surgical History: Past Surgical History:  Procedure Laterality Date   ANTERIOR LAT LUMBAR FUSION Right 12/16/2015   Procedure: RIGHT LUMBAR TWO-THREE, LUMBAR THREE-FOUR, LUMBAR FOUR-FIVE ANTEROLATERAL LUMBAR INTERBODY FUSION;  Surgeon: Erline Levine, MD;  Location: [place];  Service: Neurosurgery;  Laterality: Right;   BALLOON DILATION N/A 08/27/2021   Procedure: BALLOON DILATION;  Surgeon: Rush Landmark Telford Nab., MD;  Location: [facility];  Service: Gastroenterology;  Laterality: N/A;   BIOPSY  07/09/2021   Procedure: BIOPSY;  Surgeon: Rush Landmark Telford Nab., MD;  Location: [place];  Service: Gastroenterology;;   CYST GASTROSTOMY  08/27/2021   Procedure: CYST GASTROSTOMY;  Surgeon: Irving Copas., MD;  Location: [facility];  Service: Gastroenterology;;   ESOPHAGOGASTRODUODENOSCOPY N/A 07/09/2021   Procedure: ESOPHAGOGASTRODUODENOSCOPY (EGD);  Surgeon: Irving Copas., MD;  Location: [place];  Service: Gastroenterology;  Laterality: N/A;   ESOPHAGOGASTRODUODENOSCOPY (EGD) WITH PROPOFOL N/A 08/27/2021   Procedure:  ESOPHAGOGASTRODUODENOSCOPY (EGD) WITH PROPOFOL;  Surgeon: Rush Landmark Telford Nab., MD;  Location: [facility];  Service: Gastroenterology;  Laterality: N/A;   ESOPHAGOGASTRODUODENOSCOPY (EGD) WITH PROPOFOL N/A 10/08/2021   Procedure: ESOPHAGOGASTRODUODENOSCOPY (EGD) WITH PROPOFOL;  Surgeon: Rush Landmark Telford Nab., MD;  Location: [facility];  Service: Gastroenterology;  Laterality: N/A;   EUS N/A 07/09/2021   Procedure: UPPER ENDOSCOPIC ULTRASOUND (EUS) RADIAL;  Surgeon: Irving Copas., MD;  Location: [place];  Service: Gastroenterology;  Laterality: N/A;   EUS N/A 08/27/2021   Procedure: UPPER ENDOSCOPIC ULTRASOUND (EUS) LINEAR;  Surgeon: Irving Copas., MD;  Location: [facility];  Service: Gastroenterology;  Laterality: N/A;   FINE NEEDLE ASPIRATION  07/09/2021   Procedure: FINE NEEDLE ASPIRATION (FNA) LINEAR;  Surgeon: Irving Copas., MD;  Location: [place];  Service: Gastroenterology;;   IR IMAGING GUIDED PORT INSERTION  09/14/2021   LAPAROSCOPY N/A 02/15/2022   Procedure: STAGING DIAGNOSTIC;  Surgeon: Dwan Bolt, MD;  Location: [place];  Service: General;  Laterality: N/A;  LUMBAR PERCUTANEOUS PEDICLE SCREW 3 LEVEL Bilateral 12/16/2015   Procedure: PERCUTANEOUS PEDICLE SCREWS BILATERALLY AT LUMBAR TWO-FIVE;  Surgeon: Erline Levine, MD;  Location: [place];  Service: Neurosurgery;  Laterality: Bilateral;   PANCREATIC STENT PLACEMENT  08/27/2021   Procedure: PANCREATIC STENT PLACEMENT;  Surgeon: Irving Copas., MD;  Location: [facility];  Service: Gastroenterology;;   Lavell Islam REMOVAL  10/08/2021   Procedure: STENT REMOVAL;  Surgeon: Irving Copas., MD;  Location: [facility];  Service: Gastroenterology;;   TUBAL LIGATION  1974   WHIPPLE PROCEDURE N/A 02/15/2022   Procedure: ATTEMPTED WHIPPLE PROCEDURE;  Surgeon: Dwan Bolt, MD;  Location: [place];  Service: General;  Laterality: N/A;    Social History: Social History   Socioeconomic  History   Marital status: Widowed    Spouse name: Not on file   Number of children: Not on file   Years of education: Not on file   Highest education level: Not on file  Occupational History   Not on file  Tobacco Use   Smoking status: Never   Smokeless tobacco: Never  Vaping Use   Vaping Use: Never used  Substance and Sexual Activity   Alcohol use: No   Drug use: Never   Sexual activity: Not on file  Other Topics Concern   Not on file  Social History Narrative   Not on file   Social Determinants of Health   Financial Resource Strain: Not on file  Food Insecurity: Not on file  Transportation Needs: Not on file  Physical Activity: Not on file  Stress: Not on file  Social Connections: Not on file  Intimate Partner Violence: Not on file    Family History: Family History  Problem Relation Age of Onset   Pancreatitis Brother        alcoholic pancreatitis   Pancreatic cancer Neg Hx    Colon cancer Neg Hx     Current Medications:  Current Outpatient Medications:    acetaminophen (TYLENOL) 500 MG tablet, Take 2 tablets (1,000 mg total) by mouth every 8 (eight) hours as needed for moderate pain, headache or fever. (Patient taking differently: Take 750-1,000 mg by mouth every 8 (eight) hours as needed for moderate pain, headache or fever.), Disp: , Rfl:    Aromatic Inhalants (VICKS VAPOINHALER) INHA, Inhale 1 puff into the lungs daily as needed (congestion)., Disp: , Rfl:    betamethasone dipropionate 0.05 % cream, Apply topically 2 (two) times daily., Disp: 30 g, Rfl: 3   buPROPion (WELLBUTRIN XL) 300 MG 24 hr tablet, Take 300 mg by mouth daily after breakfast., Disp: , Rfl:    busPIRone (BUSPAR) 10 MG tablet, Take 20 mg by mouth 2 (two) times daily., Disp: , Rfl:    calcium carbonate (TUMS - DOSED IN MG ELEMENTAL CALCIUM) 500 MG chewable tablet, Chew 1 tablet (200 mg of elemental calcium total) by mouth 2 (two) times daily as needed for indigestion or heartburn. (Patient  taking differently: Chew 1-2 tablets by mouth 2 (two) times daily as needed for indigestion or heartburn.), Disp: , Rfl:    capecitabine (XELODA) 500 MG tablet, Take 3 tablets (1,500 mg total) by mouth 2 (two) times daily after a meal. Take Monday- Friday. Take only on days of radiation., Disp: 180 tablet, Rfl: 0   Cholecalciferol (VITAMIN D3) 125 MCG (5000 UT) CAPS, Take 5,000 Units by mouth every other day., Disp: , Rfl:    escitalopram (LEXAPRO) 20 MG tablet, Take 20 mg by mouth at bedtime., Disp: ,  Rfl:    fluorouracil CALGB 91478 1,920 mg/m2 in sodium chloride 0.9 % 150 mL, Inject 1,920 mg/m2 into the vein over 48 hr., Disp: , Rfl:    gabapentin (NEURONTIN) 100 MG capsule, Take 1 capsule (100 mg total) by mouth 2 (two) times daily., Disp: 60 capsule, Rfl: 0   lactose free nutrition (BOOST) LIQD, Take 237 mLs by mouth 2 (two) times daily between meals., Disp: , Rfl:    lactulose (CHRONULAC) 10 GM/15ML solution, Take 15 mLs (10 g total) by mouth at bedtime. Take 15 ml at bedtime every night to assist with bowel movements. If bowel movement has not occurred in 3-4 days take 15 ml every 3 hours until bowel movement has occurred., Disp: 236 mL, Rfl: 0   lidocaine-prilocaine (EMLA) cream, Apply a small amount to port a cath site (do not rub in) and cover with plastic wrap 1 hour prior to infusion appointments, Disp: 30 g, Rfl: 3   linaclotide (LINZESS) 72 MCG capsule, Take 1 capsule (72 mcg total) by mouth daily before breakfast., Disp: 30 capsule, Rfl: 12   methocarbamol (ROBAXIN) 500 MG tablet, Take 1 tablet (500 mg total) by mouth every 8 (eight) hours as needed for muscle spasms., Disp: 15 tablet, Rfl: 0   metoprolol succinate (TOPROL-XL) 25 MG 24 hr tablet, Take 25 mg by mouth daily after breakfast., Disp: , Rfl:    mirtazapine (REMERON) 30 MG tablet, Take 30 mg by mouth at bedtime., Disp: , Rfl:    omeprazole (PRILOSEC) 40 MG capsule, Take 1 capsule (40 mg total) by mouth daily., Disp: 30 capsule,  Rfl: 12   ondansetron (ZOFRAN-ODT) 8 MG disintegrating tablet, Take 1 tablet (8 mg total) by mouth every 8 (eight) hours as needed for nausea or vomiting., Disp: 30 tablet, Rfl: 0   Polyethyl Glycol-Propyl Glycol (SYSTANE OP), Place 1 drop into both eyes at bedtime., Disp: , Rfl:    QUEtiapine (SEROQUEL) 25 MG tablet, Take 25 mg by mouth 3 (three) times daily after meals., Disp: , Rfl:    vitamin B-12 (CYANOCOBALAMIN) 500 MCG tablet, Take 1 tablet (500 mcg total) by mouth daily., Disp: 30 tablet, Rfl: 2   Allergies: Allergies  Allergen Reactions   Other Hives and Other (See Comments)    Cherry wood just cut- "smelled it and broke out"; cannot tolerate ANY cherry fragrances, either      Cherry Hives   Wound Dressing Adhesive Rash and Other (See Comments)    Band-Aids = local reaction    REVIEW OF SYSTEMS:   Review of Systems  Constitutional:  Positive for appetite change and fatigue. Negative for chills and fever.  HENT:   Negative for lump/mass, mouth sores, nosebleeds, sore throat and trouble swallowing.   Eyes:  Negative for eye problems.  Respiratory:  Positive for shortness of breath (with exertion). Negative for cough.   Cardiovascular:  Negative for chest pain, leg swelling and palpitations.  Gastrointestinal:  Positive for constipation, diarrhea and nausea. Negative for abdominal pain and vomiting.  Genitourinary:  Negative for bladder incontinence, difficulty urinating, dysuria, frequency, hematuria and nocturia.   Musculoskeletal:  Negative for arthralgias, back pain, flank pain, myalgias and neck pain.  Skin:  Negative for itching and rash.  Neurological:  Positive for dizziness. Negative for headaches and numbness.       + tingling in hands and feet  Hematological:  Does not bruise/bleed easily.  Psychiatric/Behavioral:  Positive for depression and sleep disturbance. Negative for suicidal ideas. The patient is nervous/anxious.  All other systems reviewed and are  negative.    VITALS:   There were no vitals taken for this visit.  Wt Readings from Last 3 Encounters:  04/20/22 128 lb 12.8 oz (58.4 kg)  04/13/22 128 lb 9.6 oz (58.3 kg)  04/06/22 125 lb 9.6 oz (57 kg)    There is no height or weight on file to calculate BMI.  Performance status (ECOG): 1 - Symptomatic but completely ambulatory  PHYSICAL EXAM:   Physical Exam Vitals and nursing note reviewed. Exam conducted with a chaperone present.  Constitutional:      Appearance: Normal appearance.  HENT:     Mouth/Throat:     Comments: 1 tiny ulcer on lower lip Cardiovascular:     Rate and Rhythm: Normal rate and regular rhythm.     Pulses: Normal pulses.     Heart sounds: Normal heart sounds.  Pulmonary:     Effort: Pulmonary effort is normal.     Breath sounds: Normal breath sounds.  Abdominal:     Palpations: Abdomen is soft. There is no hepatomegaly, splenomegaly or mass.     Tenderness: There is no abdominal tenderness.  Musculoskeletal:     Right lower leg: No edema.     Left lower leg: No edema.  Lymphadenopathy:     Cervical: No cervical adenopathy.     Right cervical: No superficial, deep or posterior cervical adenopathy.    Left cervical: No superficial, deep or posterior cervical adenopathy.     Upper Body:     Right upper body: No supraclavicular or axillary adenopathy.     Left upper body: No supraclavicular or axillary adenopathy.  Skin:    Comments: Redness soles of the feet R>L, no blisters or skin peeling  Neurological:     General: No focal deficit present.     Mental Status: She is alert and oriented to person, place, and time.  Psychiatric:        Mood and Affect: Mood normal.        Behavior: Behavior normal.     LABS:      Latest Ref Rng & Units 04/20/2022    2:35 PM 04/13/2022    1:16 PM 04/06/2022    3:03 PM  CBC  WBC 4.0 - 10.5 K/uL 4.2  4.6  3.6   Hemoglobin 12.0 - 15.0 g/dL 11.1  11.1  11.1   Hematocrit 36.0 - 46.0 % 33.9  33.5  33.5    Platelets 150 - 400 K/uL 193  215  204       Latest Ref Rng & Units 04/20/2022    2:35 PM 04/13/2022    1:16 PM 04/06/2022    3:03 PM  CMP  Glucose 70 - 99 mg/dL 122  110  111   BUN 8 - 23 mg/dL 36  32  34   Creatinine 0.44 - 1.00 mg/dL 0.83  0.72  0.79   Sodium 135 - 145 mmol/L 136  135  137   Potassium 3.5 - 5.1 mmol/L 3.8  4.2  4.2   Chloride 98 - 111 mmol/L 103  105  105   CO2 22 - 32 mmol/L 24  23  24    Calcium 8.9 - 10.3 mg/dL 8.4  8.6  8.6   Total Protein 6.5 - 8.1 g/dL 6.3  6.7  6.7   Total Bilirubin 0.3 - 1.2 mg/dL 0.2  0.5  0.5   Alkaline Phos 38 - 126 U/L 90  106  111  AST 15 - 41 U/L 25  24  25    ALT 0 - 44 U/L 15  17  22     No results found for: "CEA1", "CEA" / No results found for: "CEA1", "CEA" No results found for: "PSA1" Lab Results  Component Value Date   [id number] 01/12/2022   No results found for: "CAN125"  No results found for: "TOTALPROTELP", "ALBUMINELP", "A1GS", "A2GS", "BETS", "BETA2SER", "GAMS", "MSPIKE", "SPEI" Lab Results  Component Value Date   TIBC 154 (L) 07/14/2021   FERRITIN 273 07/14/2021   IRONPCTSAT 11 07/14/2021   No results found for: "LDH"  STUDIES:   No results found.

## 2022-04-20 NOTE — Progress Notes (Signed)
Sheryl Bender presented for Portacath access and flush with lab work. Portacath located right chest wall accessed with  H 20 needle.  Good blood return present. Portacath flushed with 5ml NS and 500U/16ml Heparin and needle removed intact. Vital signs stable. No complaints at this time. Discharged from clinic ambulatory in stable condition. Alert and oriented x 3. F/U with [facility] as scheduled.

## 2022-04-26 NOTE — Progress Notes (Signed)
[facility] [address] [place] [address]    Clinic Day:  04/27/2022  Referring physician: Sherrilee Gilles, DO  Patient Care Team: Sherrilee Gilles, DO as PCP - General (Family Medicine) Gala Romney, Cristopher Estimable, MD as Consulting Physician (Gastroenterology) [name], MD as Medical Oncologist (Medical Oncology) Brien Mates, RN as Oncology Nurse Navigator (Medical Oncology)   ASSESSMENT & PLAN:   Assessment: 1. 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 - EGD/EUS (07/09/2021) by Dr. Rush Landmark: The region where the PD is strictured, darker appearance was identified.  There was no clear hypoechoic mass.  Endosonographic appearance suggestive of either chronic pancreatitis changes versus possible malignancy.  Endosonographic staging T1 N0 MX.  Pancreatic parenchymal abnormalities consisting of atrophy and hyperechoic strands.  No sign of significant pathology in the CBD.  No malignant appearing lymph nodes in the celiac region, peripancreatic region and porta hepatic region - Pathology (pancreatic duct FNA): Malignant cells consistent with adenocarcinoma - CA 19-9: 31 (0-35) - CTAP (07/13/2021): Fluid collection extending posterior to the gastric antrum and ventral to the pancreas.  Elongated collection extends to the greater curvature of the stomach with inflammation surrounding the collection suggestive of a pancreatic pseudocyst.  Chronic dilatation of pancreatic duct with abrupt termination of the duct dilatation in the head of the pancreas. - CT chest (07/16/2021): No evidence of metastatic disease in the chest. - 8 to 9 pound weight loss in the last 3 months, decreased appetite. - PET scan (07/30/2021): Faint focus of increased metabolic activity, SUV 3.6, measuring 9 mm at the junction of the pancreatic head and body, suspicious for pancreatic  adenocarcinoma.  No obvious extrapancreatic spread of tumor.  Cystic lesion along the posterior margin of the stomach and anterior margin of the pancreatic head favored pseudocyst.  Small amount of pelvic ascites. - MRI of the abdomen pancreatic protocol (08/03/2021): Similar appearance of the pancreas, with abrupt transition from dilated to decompressed duct in the pancreatic head with no evidence of underlying mass.  Pseudocyst enlarged position between pancreatic head and GE junction with significant mass effect upon the pyloric region and gastric outlet obstruction radiologically.  No evidence of abdominal metastatic disease. - FOLFOX on 09/16/2021, FOLFIRINOX cycle 2 on 09/30/2021 - 02/15/2022: Staging laparoscopy, exploratory laparotomy.  Whipple's aborted due to extensive thick scarring with obliteration of tissue planes preventing safe dissection of SMV. - XRT with Xeloda started on 04/05/2022, Xeloda dose reduced to 1000 mg twice daily on 04/20/2022 due to grade 1 HFSR   2. Social/family history: - She is accompanied by her son Sheryl Bender today.  She lives at home by herself.  She is independent of ADLs and IADLs.  She did office work prior to retirement.  Non-smoker and nonalcoholic. - Brother (alcoholic) and maternal half uncle had pancreatitis.  No history of malignancy.  3. Pulmonary embolism: - She was diagnosed with pulmonary embolism in January 2023 and then will hospital.  There does not appear to be any provoking factors for PE.  Reportedly Doppler was negative for DVT.  She was treated with Eliquis for 60 days, discontinued after that.   Plan: 1. Stage I (T1 N0 MX) pancreatic adenocarcinoma: - At last visit, I decreased Xeloda 2000 mg twice daily. - She is tolerating it well.  She complains of feeling tired.  Reports a decreased appetite since Saturday.  Occasional nausea today and took Zofran  which helps. - Labs: Normal LFTs with albumin 3.1.  Creatinine and electrolytes normal.  Potassium  low at 3.2.  CBC shows mild leukopenia with white count 3.2 but ANC normal. - Continue Xeloda 1000 mg twice daily.  RTC 1 week for follow-up.   2.  Weight loss: - Continue multiple small meals per day.  Her appetite is low and she gained 1.6 pounds.  She will increase boost if she is not eating solid foods.   3.  Grade 1 HFSR: - Erythema in the bottom of the feet, right more than left with mild soreness has improved.  No blistering or skin peeling. - Continue betamethasone dipropionate 0.05% twice daily.  Orders Placed This Encounter  Procedures   CBC with Differential/Platelet    Standing Status:   Future    Standing Expiration Date:   04/27/2023    Order Specific Question:   Release to patient    Answer:   Immediate   Comprehensive metabolic panel    Standing Status:   Future    Standing Expiration Date:   04/27/2023    Order Specific Question:   Release to patient    Answer:   Immediate   Magnesium    Standing Status:   Future    Standing Expiration Date:   04/27/2023    Order Specific Question:   Release to patient    Answer:   Immediate      I,Katie Daubenspeck,acting as a scribe for [name], MD.,have documented all relevant documentation on the behalf of [name], MD,as directed by  [name], MD while in the presence of [name], MD.   I, [name] MD, have reviewed the above documentation for accuracy and completeness, and I agree with the above.   [name], MD   4/2/20244:25 PM  CHIEF COMPLAINT:   Diagnosis: pancreatic cancer    Cancer Staging  Malignant neoplasm of pancreas Staging form: Exocrine Pancreas, AJCC 8th Edition - Clinical stage from 07/29/2021: Stage IA (cT1, cN0, cM0) - Unsigned    Prior Therapy: FOLFIRINOX   Current Therapy:  Xeloda and radiation    HISTORY OF PRESENT ILLNESS:   Oncology History  Malignant neoplasm of pancreas  07/21/2021 Initial Diagnosis   Malignant neoplasm  of pancreas ([place])   09/16/2021 -  Chemotherapy   Patient is on Treatment Plan : PANCREAS Modified FOLFIRINOX q14d x 4 cycles      Genetic Testing   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 VUS in ATM called c.4367G>A and in [facility] called c.238G>A also identified. The remainder of testing was negative/normal. The report date is 09/20/2021.  The CancerNext-Expanded + RNAinsight gene panel offered by [employer] and includes sequencing and rearrangement analysis for the following 77 genes: IP, ALK, APC*, ATM*, AXIN2, BAP1, BARD1, BLM, BMPR1A, BRCA1*, BRCA2*, BRIP1*, CDC73, CDH1*,CDK4, CDKN1B, CDKN2A, CHEK2*, CTNNA1, DICER1, FANCC, FH, FLCN, GALNT12, KIF1B, LZTR1, MAX, MEN1, MET, MLH1*, MSH2*, MSH3, MSH6*, MUTYH*, NBN, NF1*, NF2, NTHL1, PALB2*, PHOX2B, PMS2*, POT1, PRKAR1A, PTCH1, PTEN*, RAD51C*, RAD51D*,RB1, RECQL, RET, SDHA, SDHAF2, SDHB, SDHC, SDHD, SMAD4, SMARCA4, SMARCB1, SMARCE1, STK11, SUFU, TMEM127, TP53*,TSC1, TSC2, VHL and XRCC2 (sequencing and deletion/duplication); EGFR, EGLN1, HOXB13, KIT, MITF, PDGFRA, POLD1 and POLE (sequencing only); EPCAM and GREM1 (deletion/duplication only).      INTERVAL HISTORY:   Sheryl Bender is a 77 y.o. female presenting  to clinic today for follow up of pancreatic cancer . She was last seen by me on 04/20/22.  Today, she states that she is doing well overall. Her appetite level is at 10%. Her energy level is at 25%.  PAST MEDICAL HISTORY:   Past Medical History: Past Medical History:  Diagnosis Date   Anxiety    Arthritis    Depression    Dysrhythmia    hx palpitations greater than 5 yrs -neg echo, stress per pt ? where or dr   GERD (gastroesophageal reflux disease)    occ   Nausea & vomiting 08/24/2021   Pancreatic adenocarcinoma    Pancreatic pseudocyst     Port-A-Cath in place 09/15/2021   Pulmonary embolism    September 2022    Surgical History: Past Surgical History:  Procedure Laterality Date   ANTERIOR LAT LUMBAR FUSION Right 12/16/2015   Procedure: RIGHT LUMBAR TWO-THREE, LUMBAR THREE-FOUR, LUMBAR FOUR-FIVE ANTEROLATERAL LUMBAR INTERBODY FUSION;  Surgeon: Erline Levine, MD;  Location: [facility];  Service: Neurosurgery;  Laterality: Right;   BALLOON DILATION N/A 08/27/2021   Procedure: BALLOON DILATION;  Surgeon: Rush Landmark Telford Nab., MD;  Location: [facility];  Service: Gastroenterology;  Laterality: N/A;   BIOPSY  07/09/2021   Procedure: BIOPSY;  Surgeon: Rush Landmark Telford Nab., MD;  Location: [facility];  Service: Gastroenterology;;   CYST GASTROSTOMY  08/27/2021   Procedure: CYST GASTROSTOMY;  Surgeon: Irving Copas., MD;  Location: [facility];  Service: Gastroenterology;;   ESOPHAGOGASTRODUODENOSCOPY N/A 07/09/2021   Procedure: ESOPHAGOGASTRODUODENOSCOPY (EGD);  Surgeon: Irving Copas., MD;  Location: [facility];  Service: Gastroenterology;  Laterality: N/A;   ESOPHAGOGASTRODUODENOSCOPY (EGD) WITH PROPOFOL N/A 08/27/2021   Procedure: ESOPHAGOGASTRODUODENOSCOPY (EGD) WITH PROPOFOL;  Surgeon: Rush Landmark Telford Nab., MD;  Location: [facility];  Service: Gastroenterology;  Laterality: N/A;   ESOPHAGOGASTRODUODENOSCOPY (EGD) WITH PROPOFOL N/A 10/08/2021   Procedure: ESOPHAGOGASTRODUODENOSCOPY (EGD) WITH PROPOFOL;  Surgeon: Rush Landmark Telford Nab., MD;  Location: [facility];  Service: Gastroenterology;  Laterality: N/A;   EUS N/A 07/09/2021   Procedure: UPPER ENDOSCOPIC ULTRASOUND (EUS) RADIAL;  Surgeon: Irving Copas., MD;  Location: [facility];  Service: Gastroenterology;  Laterality: N/A;   EUS N/A 08/27/2021   Procedure: UPPER ENDOSCOPIC ULTRASOUND (EUS) LINEAR;  Surgeon: Irving Copas., MD;  Location: [facility];  Service: Gastroenterology;  Laterality: N/A;   FINE NEEDLE ASPIRATION  07/09/2021    Procedure: FINE NEEDLE ASPIRATION (FNA) LINEAR;  Surgeon: Irving Copas., MD;  Location: [facility];  Service: Gastroenterology;;   IR IMAGING GUIDED PORT INSERTION  09/14/2021   LAPAROSCOPY N/A 02/15/2022   Procedure: STAGING DIAGNOSTIC;  Surgeon: Dwan Bolt, MD;  Location: [facility];  Service: General;  Laterality: N/A;   LUMBAR PERCUTANEOUS PEDICLE SCREW 3 LEVEL Bilateral 12/16/2015   Procedure: PERCUTANEOUS PEDICLE SCREWS BILATERALLY AT LUMBAR TWO-FIVE;  Surgeon: Erline Levine, MD;  Location: [facility];  Service: Neurosurgery;  Laterality: Bilateral;   PANCREATIC STENT PLACEMENT  08/27/2021   Procedure: PANCREATIC STENT PLACEMENT;  Surgeon: Irving Copas., MD;  Location: [facility];  Service: Gastroenterology;;   Lavell Islam REMOVAL  10/08/2021   Procedure: STENT REMOVAL;  Surgeon: Irving Copas., MD;  Location: [facility];  Service: Gastroenterology;;   TUBAL LIGATION  1974   WHIPPLE PROCEDURE N/A 02/15/2022   Procedure: ATTEMPTED WHIPPLE PROCEDURE;  Surgeon: Dwan Bolt, MD;  Location: [facility];  Service: General;  Laterality: N/A;    Social History: Social History   Socioeconomic History   Marital status: Widowed  Spouse name: Not on file   Number of children: Not on file   Years of education: Not on file   Highest education level: Not on file  Occupational History   Not on file  Tobacco Use   Smoking status: Never   Smokeless tobacco: Never  Vaping Use   Vaping Use: Never used  Substance and Sexual Activity   Alcohol use: No   Drug use: Never   Sexual activity: Not on file  Other Topics Concern   Not on file  Social History Narrative   Not on file   Social Determinants of Health   Financial Resource Strain: Not on file  Food Insecurity: Not on file  Transportation Needs: Not on file  Physical Activity: Not on file  Stress: Not on file  Social Connections: Not on file  Intimate Partner Violence: Not on file    Family History: Family  History  Problem Relation Age of Onset   Pancreatitis Brother        alcoholic pancreatitis   Pancreatic cancer Neg Hx    Colon cancer Neg Hx     Current Medications:  Current Outpatient Medications:    acetaminophen (TYLENOL) 500 MG tablet, Take 2 tablets (1,000 mg total) by mouth every 8 (eight) hours as needed for moderate pain, headache or fever. (Patient taking differently: Take 750-1,000 mg by mouth every 8 (eight) hours as needed for moderate pain, headache or fever.), Disp: , Rfl:    Aromatic Inhalants (VICKS VAPOINHALER) INHA, Inhale 1 puff into the lungs daily as needed (congestion)., Disp: , Rfl:    betamethasone dipropionate 0.05 % cream, Apply topically 2 (two) times daily., Disp: 30 g, Rfl: 3   buPROPion (WELLBUTRIN XL) 300 MG 24 hr tablet, Take 300 mg by mouth daily after breakfast., Disp: , Rfl:    busPIRone (BUSPAR) 10 MG tablet, Take 20 mg by mouth 2 (two) times daily., Disp: , Rfl:    calcium carbonate (TUMS - DOSED IN MG ELEMENTAL CALCIUM) 500 MG chewable tablet, Chew 1 tablet (200 mg of elemental calcium total) by mouth 2 (two) times daily as needed for indigestion or heartburn. (Patient taking differently: Chew 1-2 tablets by mouth 2 (two) times daily as needed for indigestion or heartburn.), Disp: , Rfl:    capecitabine (XELODA) 500 MG tablet, Take 3 tablets (1,500 mg total) by mouth 2 (two) times daily after a meal. Take Monday- Friday. Take only on days of radiation., Disp: 180 tablet, Rfl: 0   Cholecalciferol (VITAMIN D3) 125 MCG (5000 UT) CAPS, Take 5,000 Units by mouth every other day., Disp: , Rfl:    escitalopram (LEXAPRO) 20 MG tablet, Take 20 mg by mouth at bedtime., Disp: , Rfl:    fluorouracil CALGB 09811 1,920 mg/m2 in sodium chloride 0.9 % 150 mL, Inject 1,920 mg/m2 into the vein over 48 hr., Disp: , Rfl:    gabapentin (NEURONTIN) 100 MG capsule, Take 1 capsule (100 mg total) by mouth 2 (two) times daily., Disp: 60 capsule, Rfl: 0   lactose free nutrition  (BOOST) LIQD, Take 237 mLs by mouth 2 (two) times daily between meals., Disp: , Rfl:    lactulose (CHRONULAC) 10 GM/15ML solution, Take 15 mLs (10 g total) by mouth at bedtime. Take 15 ml at bedtime every night to assist with bowel movements. If bowel movement has not occurred in 3-4 days take 15 ml every 3 hours until bowel movement has occurred., Disp: 236 mL, Rfl: 0   lidocaine-prilocaine (EMLA) cream, Apply  a small amount to port a cath site (do not rub in) and cover with plastic wrap 1 hour prior to infusion appointments, Disp: 30 g, Rfl: 3   linaclotide (LINZESS) 72 MCG capsule, Take 1 capsule (72 mcg total) by mouth daily before breakfast., Disp: 30 capsule, Rfl: 12   methocarbamol (ROBAXIN) 500 MG tablet, Take 1 tablet (500 mg total) by mouth every 8 (eight) hours as needed for muscle spasms., Disp: 15 tablet, Rfl: 0   metoprolol succinate (TOPROL-XL) 25 MG 24 hr tablet, Take 25 mg by mouth daily after breakfast., Disp: , Rfl:    mirtazapine (REMERON) 30 MG tablet, Take 30 mg by mouth at bedtime., Disp: , Rfl:    omeprazole (PRILOSEC) 40 MG capsule, Take 1 capsule (40 mg total) by mouth daily., Disp: 30 capsule, Rfl: 12   ondansetron (ZOFRAN-ODT) 8 MG disintegrating tablet, Take 1 tablet (8 mg total) by mouth every 8 (eight) hours as needed for nausea or vomiting., Disp: 30 tablet, Rfl: 0   Polyethyl Glycol-Propyl Glycol (SYSTANE OP), Place 1 drop into both eyes at bedtime., Disp: , Rfl:    QUEtiapine (SEROQUEL) 25 MG tablet, Take 25 mg by mouth 3 (three) times daily after meals., Disp: , Rfl:    vitamin B-12 (CYANOCOBALAMIN) 500 MCG tablet, Take 1 tablet (500 mcg total) by mouth daily., Disp: 30 tablet, Rfl: 2   Allergies: Allergies  Allergen Reactions   Other Hives and Other (See Comments)    Cherry wood just cut- "smelled it and broke out"; cannot tolerate ANY cherry fragrances, either      Cherry Hives   Wound Dressing Adhesive Rash and Other (See Comments)    Band-Aids = local  reaction    REVIEW OF SYSTEMS:   Review of Systems  Constitutional:  Negative for chills, fatigue and fever.  HENT:   Negative for lump/mass, mouth sores, nosebleeds, sore throat and trouble swallowing.   Eyes:  Negative for eye problems.  Respiratory:  Negative for cough and shortness of breath.   Cardiovascular:  Negative for chest pain, leg swelling and palpitations.  Gastrointestinal:  Positive for constipation, diarrhea and nausea. Negative for abdominal pain and vomiting.  Genitourinary:  Negative for bladder incontinence, difficulty urinating, dysuria, frequency, hematuria and nocturia.   Musculoskeletal:  Negative for arthralgias, back pain, flank pain, myalgias and neck pain.  Skin:  Negative for itching and rash.  Neurological:  Positive for dizziness and numbness. Negative for headaches.  Hematological:  Does not bruise/bleed easily.  Psychiatric/Behavioral:  Negative for depression, sleep disturbance and suicidal ideas. The patient is not nervous/anxious.   All other systems reviewed and are negative.    VITALS:   Blood pressure 116/68, pulse 96, temperature 99 F (37.2 C), temperature source Oral, resp. rate 18, weight 129 lb 9.6 oz (58.8 kg), SpO2 91 %.  Wt Readings from Last 3 Encounters:  04/27/22 129 lb 9.6 oz (58.8 kg)  04/27/22 129 lb 9.6 oz (58.8 kg)  04/20/22 128 lb 12.8 oz (58.4 kg)    Body mass index is 22.68 kg/m.  Performance status (ECOG): 1 - Symptomatic but completely ambulatory  PHYSICAL EXAM:   Physical Exam Vitals and nursing note reviewed. Exam conducted with a chaperone present.  Constitutional:      Appearance: Normal appearance.  Cardiovascular:     Rate and Rhythm: Normal rate and regular rhythm.     Pulses: Normal pulses.     Heart sounds: Normal heart sounds.  Pulmonary:     Effort:  Pulmonary effort is normal.     Breath sounds: Normal breath sounds.  Abdominal:     Palpations: Abdomen is soft. There is no hepatomegaly,  splenomegaly or mass.     Tenderness: There is no abdominal tenderness.  Musculoskeletal:     Right lower leg: No edema.     Left lower leg: No edema.  Lymphadenopathy:     Cervical: No cervical adenopathy.     Right cervical: No superficial, deep or posterior cervical adenopathy.    Left cervical: No superficial, deep or posterior cervical adenopathy.     Upper Body:     Right upper body: No supraclavicular or axillary adenopathy.     Left upper body: No supraclavicular or axillary adenopathy.  Skin:    Findings: Erythema (In both soles of the feet) present.  Neurological:     General: No focal deficit present.     Mental Status: She is alert and oriented to person, place, and time.  Psychiatric:        Mood and Affect: Mood normal.        Behavior: Behavior normal.     LABS:      Latest Ref Rng & Units 04/27/2022    1:58 PM 04/20/2022    2:35 PM 04/13/2022    1:16 PM  CBC  WBC 4.0 - 10.5 K/uL 3.2  4.2  4.6   Hemoglobin 12.0 - 15.0 g/dL 10.9  11.1  11.1   Hematocrit 36.0 - 46.0 % 32.6  33.9  33.5   Platelets 150 - 400 K/uL 179  193  215       Latest Ref Rng & Units 04/27/2022    1:58 PM 04/20/2022    2:35 PM 04/13/2022    1:16 PM  CMP  Glucose 70 - 99 mg/dL 114  122  110   BUN 8 - 23 mg/dL 21  36  32   Creatinine 0.44 - 1.00 mg/dL 0.71  0.83  0.72   Sodium 135 - 145 mmol/L 135  136  135   Potassium 3.5 - 5.1 mmol/L 3.2  3.8  4.2   Chloride 98 - 111 mmol/L 105  103  105   CO2 22 - 32 mmol/L 24  24  23    Calcium 8.9 - 10.3 mg/dL 8.2  8.4  8.6   Total Protein 6.5 - 8.1 g/dL 5.7  6.3  6.7   Total Bilirubin 0.3 - 1.2 mg/dL 0.4  0.2  0.5   Alkaline Phos 38 - 126 U/L 85  90  106   AST 15 - 41 U/L 25  25  24    ALT 0 - 44 U/L 20  15  17       No results found for: "CEA1", "CEA" / No results found for: "CEA1", "CEA" No results found for: "PSA1" Lab Results  Component Value Date   [id number] 10 01/12/2022   No results found for: "CAN125"  No results found for:  "TOTALPROTELP", "ALBUMINELP", "A1GS", "A2GS", "BETS", "BETA2SER", "GAMS", "MSPIKE", "SPEI" Lab Results  Component Value Date   TIBC 154 (L) 07/14/2021   FERRITIN 273 07/14/2021   IRONPCTSAT 11 07/14/2021   No results found for: "LDH"   STUDIES:   No results found.

## 2022-04-27 ENCOUNTER — Inpatient Hospital Stay: Payer: Medicare Other | Attending: Hematology | Admitting: Hematology

## 2022-04-27 ENCOUNTER — Other Ambulatory Visit: Payer: Self-pay

## 2022-04-27 ENCOUNTER — Encounter: Payer: Self-pay | Admitting: Hematology

## 2022-04-27 ENCOUNTER — Inpatient Hospital Stay: Payer: Medicare Other

## 2022-04-27 VITALS — BP 116/68 | HR 96 | Temp 99.0°F | Resp 18 | Wt 129.6 lb

## 2022-04-27 DIAGNOSIS — Z86711 Personal history of pulmonary embolism: Secondary | ICD-10-CM | POA: Diagnosis not present

## 2022-04-27 DIAGNOSIS — D72819 Decreased white blood cell count, unspecified: Secondary | ICD-10-CM | POA: Diagnosis not present

## 2022-04-27 DIAGNOSIS — L539 Erythematous condition, unspecified: Secondary | ICD-10-CM | POA: Insufficient documentation

## 2022-04-27 DIAGNOSIS — C253 Malignant neoplasm of pancreatic duct: Secondary | ICD-10-CM | POA: Insufficient documentation

## 2022-04-27 DIAGNOSIS — C250 Malignant neoplasm of head of pancreas: Secondary | ICD-10-CM

## 2022-04-27 LAB — CBC WITH DIFFERENTIAL/PLATELET
Abs Immature Granulocytes: 0.01 10*3/uL (ref 0.00–0.07)
Basophils Absolute: 0 10*3/uL (ref 0.0–0.1)
Basophils Relative: 0 %
Eosinophils Absolute: 0.1 10*3/uL (ref 0.0–0.5)
Eosinophils Relative: 2 %
HCT: 32.6 % — ABNORMAL LOW (ref 36.0–46.0)
Hemoglobin: 10.9 g/dL — ABNORMAL LOW (ref 12.0–15.0)
Immature Granulocytes: 0 %
Lymphocytes Relative: 11 %
Lymphs Abs: 0.4 10*3/uL — ABNORMAL LOW (ref 0.7–4.0)
MCH: 32.6 pg (ref 26.0–34.0)
MCHC: 33.4 g/dL (ref 30.0–36.0)
MCV: 97.6 fL (ref 80.0–100.0)
Monocytes Absolute: 0.8 10*3/uL (ref 0.1–1.0)
Monocytes Relative: 25 %
Neutro Abs: 1.9 10*3/uL (ref 1.7–7.7)
Neutrophils Relative %: 62 %
Platelets: 179 10*3/uL (ref 150–400)
RBC: 3.34 MIL/uL — ABNORMAL LOW (ref 3.87–5.11)
RDW: 14.6 % (ref 11.5–15.5)
WBC: 3.2 10*3/uL — ABNORMAL LOW (ref 4.0–10.5)
nRBC: 0 % (ref 0.0–0.2)

## 2022-04-27 LAB — COMPREHENSIVE METABOLIC PANEL
ALT: 20 U/L (ref 0–44)
AST: 25 U/L (ref 15–41)
Albumin: 3.1 g/dL — ABNORMAL LOW (ref 3.5–5.0)
Alkaline Phosphatase: 85 U/L (ref 38–126)
Anion gap: 6 (ref 5–15)
BUN: 21 mg/dL (ref 8–23)
CO2: 24 mmol/L (ref 22–32)
Calcium: 8.2 mg/dL — ABNORMAL LOW (ref 8.9–10.3)
Chloride: 105 mmol/L (ref 98–111)
Creatinine, Ser: 0.71 mg/dL (ref 0.44–1.00)
GFR, Estimated: >60 mL/min (ref >60)
Glucose, Bld: 114 mg/dL — ABNORMAL HIGH (ref 70–99)
Potassium: 3.2 mmol/L — ABNORMAL LOW (ref 3.5–5.1)
Sodium: 135 mmol/L (ref 135–145)
Total Bilirubin: 0.4 mg/dL (ref 0.3–1.2)
Total Protein: 5.7 g/dL — ABNORMAL LOW (ref 6.5–8.1)

## 2022-04-27 LAB — MAGNESIUM: Magnesium: 1.7 mg/dL (ref 1.7–2.4)

## 2022-04-27 MED ORDER — HEPARIN SOD (PORK) LOCK FLUSH 100 UNIT/ML IV SOLN
500.0000 [IU] | Freq: Once | INTRAVENOUS | Status: AC
  Administered 2022-04-27: 500 [IU] via INTRAVENOUS

## 2022-04-27 MED ORDER — SODIUM CHLORIDE 0.9% FLUSH
10.0000 mL | INTRAVENOUS | Status: DC | PRN
  Administered 2022-04-27: 10 mL via INTRAVENOUS

## 2022-04-27 NOTE — Patient Instructions (Addendum)
[facility]  Discharge Instructions  You were seen and examined today by Dr. Delton Coombes.  Dr. Delton Coombes discussed your most recent lab work which revealed that everything looks good.  Continue taking the Xeloda 1000 mg twice daily while your doing there radiation.  Follow-up as scheduled in 1 week.    Thank you for choosing [facility] to provide your oncology and hematology care.   To afford each patient quality time with our provider, please arrive at least 15 minutes before your scheduled appointment time. You may need to reschedule your appointment if you arrive late (10 or more minutes). Arriving late affects you and other patients whose appointments are after yours.  Also, if you miss three or more appointments without notifying the office, you may be dismissed from the clinic at the provider's discretion.    Again, thank you for choosing [facility].  Our hope is that these requests will decrease the amount of time that you wait before being seen by our physicians.   If you have a lab appointment with the [facility] - please note that after April 8th, all labs will be drawn in the cancer center.  You do not have to check in or register with the main entrance as you have in the past but will complete your check-in at the cancer center.            _____________________________________________________________  Should you have questions after your visit to [facility], please contact our office at [phone] and follow the prompts.  Our office hours are 8:00 a.m. to 4:30 p.m. Monday - Thursday and 8:00 a.m. to 2:30 p.m. Friday.  Please note that voicemails left after 4:00 p.m. may not be returned until the following business day.  We are closed weekends and all major holidays.  You do have access to a nurse 24-7, just call the main number to the clinic [phone] and do not press any options, hold on  the line and a nurse will answer the phone.    For prescription refill requests, have your pharmacy contact our office and allow 72 hours.    Masks are no longer required in the cancer centers. If you would like for your care team to wear a mask while they are taking care of you, please let them know. You may have one support person who is at least 79 years old accompany you for your appointments.

## 2022-04-27 NOTE — Progress Notes (Signed)
Darrol Jump presented for Portacath access and flush. Portacath located left chest wall accessed with  H 20 needle. Good blood return present. Portacath flushed with 20ml NS and 500U/35ml Heparin and needle removed intact. Procedure without incident. Patient tolerated procedure well.

## 2022-04-28 ENCOUNTER — Telehealth: Payer: Self-pay | Admitting: *Deleted

## 2022-04-28 ENCOUNTER — Emergency Department (HOSPITAL_COMMUNITY)
Admission: EM | Admit: 2022-04-28 | Discharge: 2022-04-28 | Disposition: A | Discharge status: Home / Self Care | Payer: Medicare Other | Attending: Emergency Medicine | Admitting: Emergency Medicine

## 2022-04-28 ENCOUNTER — Emergency Department (HOSPITAL_COMMUNITY): Payer: Medicare Other

## 2022-04-28 ENCOUNTER — Other Ambulatory Visit: Payer: Self-pay

## 2022-04-28 ENCOUNTER — Encounter (HOSPITAL_COMMUNITY): Payer: Self-pay

## 2022-04-28 DIAGNOSIS — R112 Nausea with vomiting, unspecified: Secondary | ICD-10-CM | POA: Insufficient documentation

## 2022-04-28 DIAGNOSIS — Z79899 Other long term (current) drug therapy: Secondary | ICD-10-CM | POA: Insufficient documentation

## 2022-04-28 DIAGNOSIS — D72819 Decreased white blood cell count, unspecified: Secondary | ICD-10-CM | POA: Insufficient documentation

## 2022-04-28 DIAGNOSIS — C259 Malignant neoplasm of pancreas, unspecified: Secondary | ICD-10-CM | POA: Diagnosis not present

## 2022-04-28 DIAGNOSIS — R531 Weakness: Secondary | ICD-10-CM

## 2022-04-28 DIAGNOSIS — R110 Nausea: Secondary | ICD-10-CM

## 2022-04-28 DIAGNOSIS — R1013 Epigastric pain: Secondary | ICD-10-CM | POA: Diagnosis present

## 2022-04-28 DIAGNOSIS — E876 Hypokalemia: Secondary | ICD-10-CM | POA: Insufficient documentation

## 2022-04-28 DIAGNOSIS — R1010 Upper abdominal pain, unspecified: Secondary | ICD-10-CM

## 2022-04-28 LAB — COMPREHENSIVE METABOLIC PANEL
ALT: 32 U/L (ref 0–44)
AST: 32 U/L (ref 15–41)
Albumin: 2.8 g/dL — ABNORMAL LOW (ref 3.5–5.0)
Alkaline Phosphatase: 78 U/L (ref 38–126)
Anion gap: 6 (ref 5–15)
BUN: 17 mg/dL (ref 8–23)
CO2: 24 mmol/L (ref 22–32)
Calcium: 7.8 mg/dL — ABNORMAL LOW (ref 8.9–10.3)
Chloride: 104 mmol/L (ref 98–111)
Creatinine, Ser: 0.67 mg/dL (ref 0.44–1.00)
GFR, Estimated: >60 mL/min (ref >60)
Glucose, Bld: 116 mg/dL — ABNORMAL HIGH (ref 70–99)
Potassium: 2.9 mmol/L — ABNORMAL LOW (ref 3.5–5.1)
Sodium: 134 mmol/L — ABNORMAL LOW (ref 135–145)
Total Bilirubin: 0.4 mg/dL (ref 0.3–1.2)
Total Protein: 5.3 g/dL — ABNORMAL LOW (ref 6.5–8.1)

## 2022-04-28 LAB — CBC
HCT: 31.4 % — ABNORMAL LOW (ref 36.0–46.0)
Hemoglobin: 10.5 g/dL — ABNORMAL LOW (ref 12.0–15.0)
MCH: 32.7 pg (ref 26.0–34.0)
MCHC: 33.4 g/dL (ref 30.0–36.0)
MCV: 97.8 fL (ref 80.0–100.0)
Platelets: 155 10*3/uL (ref 150–400)
RBC: 3.21 MIL/uL — ABNORMAL LOW (ref 3.87–5.11)
RDW: 14.8 % (ref 11.5–15.5)
WBC: 3.1 10*3/uL — ABNORMAL LOW (ref 4.0–10.5)
nRBC: 0 % (ref 0.0–0.2)

## 2022-04-28 LAB — MAGNESIUM: Magnesium: 1.7 mg/dL (ref 1.7–2.4)

## 2022-04-28 LAB — LIPASE, BLOOD: Lipase: 20 U/L (ref 11–51)

## 2022-04-28 MED ORDER — IOHEXOL 300 MG/ML  SOLN
100.0000 mL | Freq: Once | INTRAMUSCULAR | Status: AC | PRN
  Administered 2022-04-28: 100 mL via INTRAVENOUS

## 2022-04-28 MED ORDER — MORPHINE SULFATE (PF) 2 MG/ML IV SOLN
2.0000 mg | Freq: Once | INTRAVENOUS | Status: AC
  Administered 2022-04-28: 2 mg via INTRAVENOUS
  Filled 2022-04-28: qty 1

## 2022-04-28 MED ORDER — ONDANSETRON HCL 4 MG/2ML IJ SOLN
4.0000 mg | Freq: Once | INTRAMUSCULAR | Status: AC
  Administered 2022-04-28: 4 mg via INTRAVENOUS
  Filled 2022-04-28: qty 2

## 2022-04-28 MED ORDER — POTASSIUM CHLORIDE CRYS ER 20 MEQ PO TBCR
40.0000 meq | EXTENDED_RELEASE_TABLET | Freq: Once | ORAL | Status: AC
  Administered 2022-04-28: 40 meq via ORAL
  Filled 2022-04-28: qty 2

## 2022-04-28 MED ORDER — POTASSIUM CHLORIDE CRYS ER 20 MEQ PO TBCR: EXTENDED_RELEASE_TABLET | ORAL | 0 refills | Status: DC

## 2022-04-28 MED ORDER — HEPARIN SOD (PORK) LOCK FLUSH 100 UNIT/ML IV SOLN
500.0000 [IU] | Freq: Once | INTRAVENOUS | Status: AC
  Administered 2022-04-28: 500 [IU]
  Filled 2022-04-28: qty 5

## 2022-04-28 MED ORDER — LACTATED RINGERS IV BOLUS
1000.0000 mL | Freq: Once | INTRAVENOUS | Status: AC
  Administered 2022-04-28: 1000 mL via INTRAVENOUS

## 2022-04-28 NOTE — ED Triage Notes (Addendum)
Pt c/o increasing n/v/d x3 days and upper abdominal pain starting today.  Pain score 5/10.  Hx of pancreatic CA.  Pt has been taking radiation and oral chemo medication x almost 4 weeks.    Pt was seen by Oncologist yesterday and directed to take Imodium and Zofran.

## 2022-04-28 NOTE — ED Provider Notes (Signed)
[place] Provider Note   CSN: [id number] Arrival date & time: 04/28/22  [id number]     History  Chief Complaint  Patient presents with   Abdominal Pain   Emesis   Diarrhea    Sheryl Bender is a 77 y.o. female.  Pt with hx pancreatitic ca, on xeloda and radiation tx (attempt at resection/[facility] aborted 01/2022 due to extensive adhesions/scarring in area), presents with epigastric paina nausea. Saw oncologist w same yesterday - was advised to decrease xeloda dose. Episodes of emesis, denies bloody or bilious emesis.  1-2 loose bms within past day, no severe diarrhea. No fever or chills. No dysuria. No chest pain or sob.   The history is provided by the patient, medical records and a relative.  Abdominal Pain Associated symptoms: diarrhea and vomiting   Associated symptoms: no chest pain, no chills, no cough, no dysuria, no fever, no shortness of breath and no sore throat   Emesis Associated symptoms: abdominal pain and diarrhea   Associated symptoms: no chills, no cough, no fever, no headaches and no sore throat   Diarrhea Associated symptoms: abdominal pain and vomiting   Associated symptoms: no chills, no fever and no headaches        Home Medications Prior to Admission medications   Medication Sig Start Date End Date Taking? Authorizing Provider  acetaminophen (TYLENOL) 500 MG tablet Take 2 tablets (1,000 mg total) by mouth every 8 (eight) hours as needed for moderate pain, headache or fever. Patient taking differently: Take 750-1,000 mg by mouth every 8 (eight) hours as needed for moderate pain, headache or fever. 07/21/21   Barton Dubois, MD  Aromatic Inhalants (VICKS VAPOINHALER) INHA Inhale 1 puff into the lungs daily as needed (congestion).    [provider]  betamethasone dipropionate 0.05 % cream Apply topically 2 (two) times daily. 04/20/22   [name], MD  buPROPion (WELLBUTRIN XL) 300 MG 24 hr tablet  Take 300 mg by mouth daily after breakfast. 06/15/21   [provider]  busPIRone (BUSPAR) 10 MG tablet Take 20 mg by mouth 2 (two) times daily. 01/27/21   [provider]  calcium carbonate (TUMS - DOSED IN MG ELEMENTAL CALCIUM) 500 MG chewable tablet Chew 1 tablet (200 mg of elemental calcium total) by mouth 2 (two) times daily as needed for indigestion or heartburn. Patient taking differently: Chew 1-2 tablets by mouth 2 (two) times daily as needed for indigestion or heartburn. 08/31/21   Dwyane Dee, MD  capecitabine (XELODA) 500 MG tablet Take 3 tablets (1,500 mg total) by mouth 2 (two) times daily after a meal. Take Monday- Friday. Take only on days of radiation. 03/03/22   [name], MD  Cholecalciferol (VITAMIN D3) 125 MCG (5000 UT) CAPS Take 5,000 Units by mouth every other day.    [provider]  escitalopram (LEXAPRO) 20 MG tablet Take 20 mg by mouth at bedtime.    [provider]  fluorouracil CALGB 96295 1,920 mg/m2 in sodium chloride 0.9 % 150 mL Inject 1,920 mg/m2 into the vein over 48 hr.    [provider]  gabapentin (NEURONTIN) 100 MG capsule Take 1 capsule (100 mg total) by mouth 2 (two) times daily. 04/14/22   [name], MD  lactose free nutrition (BOOST) LIQD Take 237 mLs by mouth 2 (two) times daily between meals.    [provider]  lactulose (CHRONULAC) 10 GM/15ML solution Take 15 mLs (10 g total) by mouth at bedtime.  Take 15 ml at bedtime every night to assist with bowel movements. If bowel movement has not occurred in 3-4 days take 15 ml every 3 hours until bowel movement has occurred. 10/02/21   Harriett Rush, PA-C  lidocaine-prilocaine (EMLA) cream Apply a small amount to port a cath site (do not rub in) and cover with plastic wrap 1 hour prior to infusion appointments 09/15/21   [name], MD  linaclotide [facility]) 72 MCG capsule Take 1 capsule (72 mcg total) by mouth daily before  breakfast. 10/08/21   Mansouraty, Telford Nab., MD  methocarbamol (ROBAXIN) 500 MG tablet Take 1 tablet (500 mg total) by mouth every 8 (eight) hours as needed for muscle spasms. 02/19/22   Dwan Bolt, MD  metoprolol succinate (TOPROL-XL) 25 MG 24 hr tablet Take 25 mg by mouth daily after breakfast.    [provider]  mirtazapine (REMERON) 30 MG tablet Take 30 mg by mouth at bedtime. 06/15/21   [provider]  omeprazole (PRILOSEC) 40 MG capsule Take 1 capsule (40 mg total) by mouth daily. 10/08/21   Mansouraty, Telford Nab., MD  ondansetron (ZOFRAN-ODT) 8 MG disintegrating tablet Take 1 tablet (8 mg total) by mouth every 8 (eight) hours as needed for nausea or vomiting. 07/21/21   Barton Dubois, MD  Polyethyl Glycol-Propyl Glycol (SYSTANE OP) Place 1 drop into both eyes at bedtime.    [provider]  QUEtiapine (SEROQUEL) 25 MG tablet Take 25 mg by mouth 3 (three) times daily after meals.    [provider]  vitamin B-12 (CYANOCOBALAMIN) 500 MCG tablet Take 1 tablet (500 mcg total) by mouth daily. 07/22/21   Barton Dubois, MD      Allergies    Other, [place], and Wound dressing adhesive    Review of Systems   Review of Systems  Constitutional:  Negative for chills and fever.  HENT:  Negative for sore throat.   Eyes:  Negative for redness.  Respiratory:  Negative for cough and shortness of breath.   Cardiovascular:  Negative for chest pain.  Gastrointestinal:  Positive for abdominal pain, diarrhea and vomiting.  Genitourinary:  Negative for dysuria and flank pain.  Musculoskeletal:  Negative for back pain and neck pain.  Skin:  Negative for rash.  Neurological:  Negative for headaches.  Hematological:  Does not bruise/bleed easily.  Psychiatric/Behavioral:  Negative for confusion.     Physical Exam Updated Vital Signs BP 113/72   Pulse 90   Temp 97.7 F (36.5 C) (Oral)   Resp 16   Ht 1.6 m (5\' 3" )   Wt 58.5 kg   SpO2 95%   BMI 22.85 kg/m   Physical Exam Vitals and nursing note reviewed.  Constitutional:      Appearance: Normal appearance. She is well-developed.  HENT:     Head: Atraumatic.     Nose: Nose normal.     Mouth/Throat:     Mouth: Mucous membranes are moist.  Eyes:     General: No scleral icterus.    Conjunctiva/sclera: Conjunctivae normal.  Neck:     Trachea: No tracheal deviation.  Cardiovascular:     Rate and Rhythm: Normal rate and regular rhythm.     Pulses: Normal pulses.     Heart sounds: Normal heart sounds. No murmur heard.    No friction rub. No gallop.  Pulmonary:     Effort: Pulmonary effort is normal. No respiratory distress.     Breath sounds: Normal breath sounds.  Abdominal:  General: Bowel sounds are normal. There is no distension.     Palpations: Abdomen is soft.     Tenderness: There is no abdominal tenderness. There is no guarding.  Genitourinary:    Comments: No cva tenderness.  Musculoskeletal:        General: No swelling.     Cervical back: Normal range of motion and neck supple. No rigidity. No muscular tenderness.  Skin:    General: Skin is warm and dry.     Findings: No rash.  Neurological:     Mental Status: She is alert.     Comments: Alert, speech normal.   Psychiatric:        Mood and Affect: Mood normal.     ED Results / Procedures / Treatments   Labs (all labs ordered are listed, but only abnormal results are displayed) Results for orders placed or performed during the hospital encounter of 04/28/22  CBC  Result Value Ref Range   WBC 3.1 (L) 4.0 - 10.5 K/uL   RBC 3.21 (L) 3.87 - 5.11 MIL/uL   Hemoglobin 10.5 (L) 12.0 - 15.0 g/dL   HCT 31.4 (L) 36.0 - 46.0 %   MCV 97.8 80.0 - 100.0 fL   MCH 32.7 26.0 - 34.0 pg   MCHC 33.4 30.0 - 36.0 g/dL   RDW 14.8 11.5 - 15.5 %   Platelets 155 150 - 400 K/uL   nRBC 0.0 0.0 - 0.2 %  Comprehensive metabolic panel  Result Value Ref Range   Sodium 134 (L) 135 - 145 mmol/L   Potassium 2.9 (L) 3.5 - 5.1 mmol/L    Chloride 104 98 - 111 mmol/L   CO2 24 22 - 32 mmol/L   Glucose, Bld 116 (H) 70 - 99 mg/dL   BUN 17 8 - 23 mg/dL   Creatinine, Ser 0.67 0.44 - 1.00 mg/dL   Calcium 7.8 (L) 8.9 - 10.3 mg/dL   Total Protein 5.3 (L) 6.5 - 8.1 g/dL   Albumin 2.8 (L) 3.5 - 5.0 g/dL   AST 32 15 - 41 U/L   ALT 32 0 - 44 U/L   Alkaline Phosphatase 78 38 - 126 U/L   Total Bilirubin 0.4 0.3 - 1.2 mg/dL   GFR, Estimated >60 >60 mL/min   Anion gap 6 5 - 15  Lipase, blood  Result Value Ref Range   Lipase 20 11 - 51 U/L      EKG None  Radiology CT Abdomen Pelvis W Contrast  Result Date: 04/28/2022 CLINICAL DATA:  Abdominal pain.  History of pancreatic cancer. EXAM: CT ABDOMEN AND PELVIS WITH CONTRAST TECHNIQUE: Multidetector CT imaging of the abdomen and pelvis was performed using the standard protocol following bolus administration of intravenous contrast. RADIATION DOSE REDUCTION: This exam was performed according to the departmental dose-optimization program which includes automated exposure control, adjustment of the mA and/or kV according to patient size and/or use of iterative reconstruction technique. CONTRAST:  168mL OMNIPAQUE IOHEXOL 300 MG/ML  SOLN COMPARISON:  02/08/2022 FINDINGS: Lower chest: No acute pulmonary findings. No pleural effusion. The heart is normal in size. No pericardial effusion. Hepatobiliary: No hepatic lesions or intrahepatic biliary dilatation. The gallbladder is not identified and may be surgically absent or completely collapsed. Mild common bile duct dilatation. Pancreas: Severe atrophy of the pancreatic body and tail with marked pancreatic ductal dilatation with an abrupt cut off at the body head junction region without measurable tumor. Spleen: 2 separate spleens are noted.  No splenic lesions. Adrenals/Urinary Tract:  Adrenal glands and kidneys are unremarkable and stable. Stable bilateral parapelvic renal cysts not requiring any further imaging evaluation or follow-up. The bladder is  unremarkable. Moderate distension. Stomach/Bowel: Gastric wall thickening and surrounding interstitial changes could be due to gastritis or peptic ulcer disease or possibly secondary to radiation change. Interstitial changes surround the pancreatic head, second and third portions of the duodenum and the mesenteric vessels likely radiation related or inflammatory process. Vascular/Lymphatic: Stable aortic and branch vessel calcifications but no aneurysm or dissection. The major venous structures are patent. No mesenteric or retroperitoneal lymphadenopathy. Reproductive: The uterus and ovaries are unremarkable. Other: Small amount of free pelvic fluid. Musculoskeletal: No significant bony findings. 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 No worrisome bone lesions. IMPRESSION: 1. 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 2. Interstitial changes surround the pancreatic head, second and third portions of the duodenum and the mesenteric vessels likely radiation related or inflammatory process. 3. Gastric wall thickening and surrounding interstitial changes could be due to gastritis or peptic ulcer disease or possibly secondary to radiation change. 4. Small amount of free pelvic fluid. 5. Aortic atherosclerosis. Aortic Atherosclerosis (ICD10-I70.0). Electronically Signed   By: Marijo Sanes M.D.   On: 04/28/2022 14:43    Procedures Procedures    Medications Ordered in ED Medications  potassium chloride SA (KLOR-CON M) CR tablet 40 mEq (has no administration in time range)  morphine (PF) 2 MG/ML injection 2 mg (2 mg Intravenous Given 04/28/22 1122)  ondansetron (ZOFRAN) injection 4 mg (4 mg Intravenous Given 04/28/22 1121)  lactated ringers bolus 1,000 mL (1,000 mLs Intravenous New Bag/Given 04/28/22 1123)    ED Course/ Medical Decision  Making/ A&P                             Medical Decision Making Problems Addressed: General weakness: acute illness or injury with systemic symptoms that poses a threat to life or bodily functions Hypokalemia: acute illness or injury Malignant neoplasm of pancreas, unspecified location of malignancy: chronic illness or injury with exacerbation, progression, or side effects of treatment that poses a threat to life or bodily functions Nausea: acute illness or injury with systemic symptoms that poses a threat to life or bodily functions Upper abdominal pain: acute illness or injury  Amount and/or Complexity of Data Reviewed Independent Historian:     Details: Family/friend, hx External Data Reviewed: labs, radiology and notes. Labs: ordered. Decision-making details documented in ED Course. Radiology: ordered and independent interpretation performed. Decision-making details documented in ED Course.  Risk Prescription drug management. Parenteral controlled substances. Decision regarding hospitalization.   Iv ns. Continuous pulse ox and cardiac monitoring. Labs ordered/sent. Imaging ordered.   Differential diagnosis includes pancreatitis, sbo, gastroenteritis, gastritis, treatment side effect, etc. Dispo decision including potential need for admission considered - will get labs and imaging and reassess.   Reviewed nursing notes and prior charts for additional history. External reports reviewed. Additional history from: family/friend.   Morphine iv, zofran iv, LR bolus iv.   Cardiac monitor: sinus rhythm, rate 90.  Labs reviewed/interpreted by me - wbc 3. K low. Mg added. Kcl po.   CT reviewed/interpreted by me - radiation changes, but no new/increased mass, no abscess, no sbo.   Discussed labs, imaging w pt, recommend continue prilosec, rehydration/push fluids, close pcp/onc f/u.  Return precautions provided.            Final Clinical Impression(s) / ED  Diagnoses Final diagnoses:  None    Rx / DC Orders ED Discharge Orders     None         Lajean Saver, MD 04/28/22 1528

## 2022-04-28 NOTE — Telephone Encounter (Signed)
Patient called this morning to advise that she has been vomiting and diarrhea, associated with profound weakness.  Denies significant pain, however says she feels like she dis previously with pancreatitis.  She is going to have a friend bring her to the ER for evaluation.

## 2022-04-28 NOTE — Discharge Instructions (Addendum)
It was our pleasure to provide your ER care today - we hope that you feel better.  Drink plenty of fluids/stay well hydrated. Consider supplementing nutrition with Boost, Ensure or other nutritious shake. Continue prilosec. Take zofran as need.   Follow up closely with your doctor/oncologist - have them review recent ct scan.   From today's, labs, your potassium level is low - eat plenty of fruits and vegetables, take potassium supplement as prescribed, and follow up with primary care doctor in one week.   Return to ER if worse, new symptoms, fevers, new/severe pain, persistent vomiting, or other emergency concern.

## 2022-05-03 NOTE — Progress Notes (Signed)
[facility] [address], [place] [address]    Clinic Day:  05/04/2022  Referring physician: [name], DO  Patient Care Team: [name], DO as PCP - General (Family Medicine) Jena Gauss, Gerrit Friends, MD as Consulting Physician (Gastroenterology) Doreatha Massed, MD as Medical Oncologist (Medical Oncology) Therese Sarah, RN as Oncology Nurse Navigator (Medical Oncology)   ASSESSMENT & PLAN:   Assessment: 1. 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 - EGD/EUS (07/09/2021) by Dr. Meridee Score: The region where the PD is strictured, darker appearance was identified.  There was no clear hypoechoic mass.  Endosonographic appearance suggestive of either chronic pancreatitis changes versus possible malignancy.  Endosonographic staging T1 N0 MX.  Pancreatic parenchymal abnormalities consisting of atrophy and hyperechoic strands.  No sign of significant pathology in the CBD.  No malignant appearing lymph nodes in the celiac region, peripancreatic region and porta hepatic region - Pathology (pancreatic duct FNA): Malignant cells consistent with adenocarcinoma - CA 19-9: 31 (0-35) - CTAP (07/13/2021): Fluid collection extending posterior to the gastric antrum and ventral to the pancreas.  Elongated collection extends to the greater curvature of the stomach with inflammation surrounding the collection suggestive of a pancreatic pseudocyst.  Chronic dilatation of pancreatic duct with abrupt termination of the duct dilatation in the head of the pancreas. - CT chest (07/16/2021): No evidence of metastatic disease in the chest. - 8 to 9 pound weight loss in the last 3 months, decreased appetite. - PET scan (07/30/2021): Faint focus of increased metabolic activity, SUV 3.6, measuring 9 mm at the junction of the pancreatic head and body, suspicious for pancreatic  adenocarcinoma.  No obvious extrapancreatic spread of tumor.  Cystic lesion along the posterior margin of the stomach and anterior margin of the pancreatic head favored pseudocyst.  Small amount of pelvic ascites. - MRI of the abdomen pancreatic protocol (08/03/2021): Similar appearance of the pancreas, with abrupt transition from dilated to decompressed duct in the pancreatic head with no evidence of underlying mass.  Pseudocyst enlarged position between pancreatic head and GE junction with significant mass effect upon the pyloric region and gastric outlet obstruction radiologically.  No evidence of abdominal metastatic disease. - FOLFOX on 09/16/2021, FOLFIRINOX cycle 2 on 09/30/2021 - 02/15/2022: Staging laparoscopy, exploratory laparotomy.  Whipple's aborted due to extensive thick scarring with obliteration of tissue planes preventing safe dissection of SMV. - XRT with Xeloda started on 04/05/2022, Xeloda dose reduced to 1000 mg twice daily on 04/20/2022 due to grade 1 HFSR   2. Social/family history: - She is accompanied by her son [name] today.  She lives at home by herself.  She is independent of ADLs and IADLs.  She did office work prior to retirement.  Non-smoker and nonalcoholic. - Brother (alcoholic) and maternal half uncle had pancreatitis.  No history of malignancy.  3. Pulmonary embolism: - She was diagnosed with pulmonary embolism in January 2023 and then will hospital.  There does not appear to be any provoking factors for PE.  Reportedly Doppler was negative for DVT.  She was treated with Eliquis for 60 days, discontinued after that.   Plan: 1. Stage I (T1 N0 MX) pancreatic adenocarcinoma: - She was seen by me on 04/27/2022 and went to the ER on 04/28/2022 due to weakness, abdominal pain and nausea.  She was also having intermittent diarrhea. - CT AP (04/28/2022): Stable severe atrophy of  the pancreatic body and tail with no clear mass.  Marked pancreatic ductal dilatation stable.  Interstitial  changes around the pancreatic head, second and third portions of duodenum and mesenteric vessels likely radiation related or inflammatory process.  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 - She was sent by Dr. Jeannette HowQuaranta today for fluids.  She has missed XRT on 04/28/2022.  She did not take any Xeloda yesterday.  She took this morning dose of 2 pills. - We reviewed labs from today with normal magnesium, creatinine and LFTs.  Potassium was low at 3.1.  CBC was grossly normal. - I have recommended 1 L of fluid with electrolytes today. - Continue Imodium for diarrhea. - Recommend holding Xeloda for the rest of the week. - She was told to come back on Thursday for labs and more fluids. - RTC 1 week for follow-up.   2.  Weight loss: - Her weight was stable on last visit on 04/27/2022.  However she lost 300 pounds in the last 1 week.  She is not eating much because of decreased taste.  She is drinking 2 boost per day. - I have recommended that she drink boost +3 to 5 cans/day.   3.  Grade 1 HFSR: - Erythema in the bottom of the feet, right more than left is stable. - Continue betamethasone dipropionate 0.05% twice daily.  Orders Placed This Encounter  Procedures   Comprehensive metabolic panel    Standing Status:   Future    Standing Expiration Date:   05/04/2023    Order Specific Question:   Release to patient    Answer:   Immediate   Magnesium    Standing Status:   Future    Standing Expiration Date:   05/04/2023    Order Specific Question:   Release to patient    Answer:   Immediate      I,Katie Daubenspeck,acting as a scribe for Doreatha MassedSreedhar Jacob Chamblee, MD.,have documented all relevant documentation on the behalf of Doreatha MassedSreedhar Kashaun Bebo, MD,as directed by  Doreatha MassedSreedhar Damarri Rampy, MD while in the presence of Doreatha MassedSreedhar Danajah Birdsell, MD.   I, Doreatha MassedSreedhar Trendon Zaring MD, have reviewed the above documentation for accuracy and completeness, and I agree  with the above.   Doreatha MassedSreedhar Adrean Heitz, MD   4/9/20243:40 PM  CHIEF COMPLAINT:   Diagnosis: pancreatic cancer    Cancer Staging  Malignant neoplasm of pancreas Staging form: Exocrine Pancreas, AJCC 8th Edition - Clinical stage from 07/29/2021: Stage IA (cT1, cN0, cM0) - Unsigned    Prior Therapy: FOLFIRINOX   Current Therapy:  Xeloda and radiation    HISTORY OF PRESENT ILLNESS:   Oncology History  Malignant neoplasm of pancreas  07/21/2021 Initial Diagnosis   Malignant neoplasm of pancreas (HCC)   09/16/2021 -  Chemotherapy   Patient is on Treatment Plan : PANCREAS Modified FOLFIRINOX q14d x 4 cycles      Genetic Testing   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 VUS in ATM called c.4367G>A and in [facility] called c.238G>A also identified. The remainder of testing was negative/normal. The report date is 09/20/2021.  The CancerNext-Expanded + RNAinsight gene panel offered by [employer] [employer] and includes sequencing and rearrangement analysis for the following 77 genes: IP, ALK, APC*, ATM*, AXIN2, BAP1, BARD1, BLM, BMPR1A, BRCA1*, BRCA2*, BRIP1*, CDC73, CDH1*,CDK4, CDKN1B, CDKN2A, CHEK2*, CTNNA1,  DICER1, FANCC, FH, FLCN, GALNT12, KIF1B, LZTR1, MAX, MEN1, MET, MLH1*, MSH2*, MSH3, MSH6*, MUTYH*, NBN, NF1*, NF2, NTHL1, PALB2*, PHOX2B, PMS2*, POT1, PRKAR1A, PTCH1, PTEN*, RAD51C*, RAD51D*,RB1, RECQL, RET, SDHA, SDHAF2, SDHB, SDHC, SDHD, SMAD4, SMARCA4, SMARCB1, SMARCE1, STK11, SUFU, TMEM127, TP53*,TSC1, TSC2, VHL and XRCC2 (sequencing and deletion/duplication); EGFR, EGLN1, HOXB13, KIT, MITF, PDGFRA, POLD1 and POLE (sequencing only); EPCAM and GREM1 (deletion/duplication only).      INTERVAL HISTORY:   Sheryl Bender is a 77 y.o. female presenting to clinic today for follow up of pancreatic cancer. She was  last seen by me on 04/27/22.  After our last visit, she presented to the ED the following day with abdominal pain, emesis, and diarrhea. CT A/P showed: stable severe atrophy of pancreatic body and tail with marked pancreatic ductal dilatation without measurable tumor; interstitial changes around pancreatic head, likely radiation related; gastric wall thickening and surrounding interstitial changes.  Today, she states that she is doing well overall. Her appetite level is at 0%. Her energy level is at 25%.  PAST MEDICAL HISTORY:   Past Medical History: Past Medical History:  Diagnosis Date   Anxiety    Arthritis    Depression    Dysrhythmia    hx palpitations greater than 5 yrs -neg echo, stress per pt ? where or dr   GERD (gastroesophageal reflux disease)    occ   Nausea & vomiting 08/24/2021   Pancreatic adenocarcinoma    Pancreatic pseudocyst    Port-A-Cath in place 09/15/2021   Pulmonary embolism    September 2022    Surgical History: Past Surgical History:  Procedure Laterality Date   ANTERIOR LAT LUMBAR FUSION Right 12/16/2015   Procedure: RIGHT LUMBAR TWO-THREE, LUMBAR THREE-FOUR, LUMBAR FOUR-FIVE ANTEROLATERAL LUMBAR INTERBODY FUSION;  Surgeon: Maeola Harman, MD;  Location: [facility] OR;  Service: Neurosurgery;  Laterality: Right;   BALLOON DILATION N/A 08/27/2021   Procedure: BALLOON DILATION;  Surgeon: Meridee Score Netty Starring., MD;  Location: [facility] ENDOSCOPY;  Service: Gastroenterology;  Laterality: N/A;   BIOPSY  07/09/2021   Procedure: BIOPSY;  Surgeon: Meridee Score Netty Starring., MD;  Location: [facility] ENDOSCOPY;  Service: Gastroenterology;;   CYST GASTROSTOMY  08/27/2021   Procedure: CYST GASTROSTOMY;  Surgeon: Lemar Lofty., MD;  Location: [facility] ENDOSCOPY;  Service: Gastroenterology;;   ESOPHAGOGASTRODUODENOSCOPY N/A 07/09/2021   Procedure: ESOPHAGOGASTRODUODENOSCOPY (EGD);  Surgeon: Lemar Lofty., MD;  Location: [facility] ENDOSCOPY;  Service: Gastroenterology;  Laterality: N/A;    ESOPHAGOGASTRODUODENOSCOPY (EGD) WITH PROPOFOL N/A 08/27/2021   Procedure: ESOPHAGOGASTRODUODENOSCOPY (EGD) WITH PROPOFOL;  Surgeon: Meridee Score Netty Starring., MD;  Location: [facility] ENDOSCOPY;  Service: Gastroenterology;  Laterality: N/A;   ESOPHAGOGASTRODUODENOSCOPY (EGD) WITH PROPOFOL N/A 10/08/2021   Procedure: ESOPHAGOGASTRODUODENOSCOPY (EGD) WITH PROPOFOL;  Surgeon: Meridee Score Netty Starring., MD;  Location: [facility] ENDOSCOPY;  Service: Gastroenterology;  Laterality: N/A;   EUS N/A 07/09/2021   Procedure: UPPER ENDOSCOPIC ULTRASOUND (EUS) RADIAL;  Surgeon: Lemar Lofty., MD;  Location: [facility] ENDOSCOPY;  Service: Gastroenterology;  Laterality: N/A;   EUS N/A 08/27/2021   Procedure: UPPER ENDOSCOPIC ULTRASOUND (EUS) LINEAR;  Surgeon: Lemar Lofty., MD;  Location: [facility] ENDOSCOPY;  Service: Gastroenterology;  Laterality: N/A;   FINE NEEDLE ASPIRATION  07/09/2021   Procedure: FINE NEEDLE ASPIRATION (FNA) LINEAR;  Surgeon: Lemar Lofty., MD;  Location: [facility] ENDOSCOPY;  Service: Gastroenterology;;   IR IMAGING GUIDED PORT INSERTION  09/14/2021   LAPAROSCOPY N/A 02/15/2022   Procedure: STAGING DIAGNOSTIC;  Surgeon: Fritzi Mandes, MD;  Location[facility] OR;  Service: General;  Laterality: N/A;   LUMBAR PERCUTANEOUS  PEDICLE SCREW 3 LEVEL Bilateral 12/16/2015   Procedure: PERCUTANEOUS PEDICLE SCREWS BILATERALLY AT LUMBAR TWO-FIVE;  Surgeon: Maeola Harman, MD;  Location: [facility] OR;  Service: Neurosurgery;  Laterality: Bilateral;   PANCREATIC STENT PLACEMENT  08/27/2021   Procedure: PANCREATIC STENT PLACEMENT;  Surgeon: Lemar Lofty., MD;  Location: [facility]COPY;  Service: Gastroenterology;;   Francine Graven REMOVAL  10/08/2021   Procedure: STENT REMOVAL;  Surgeon: Lemar Lofty., MD;  Location: [facility]OSCOPY;  Service: Gastroenterology;;   TUBAL LIGATION  1974   WHIPPLE PROCEDURE N/A 02/15/2022   Procedure: ATTEMPTED WHIPPLE PROCEDURE;  Surgeon: Fritzi Mandes, MD;  Location: [facility] OR;  Service: General;   Laterality: N/A;    Social History: Social History   Socioeconomic History   Marital status: Widowed    Spouse name: Not on file   Number of children: Not on file   Years of education: Not on file   Highest education level: Not on file  Occupational History   Not on file  Tobacco Use   Smoking status: Never   Smokeless tobacco: Never  Vaping Use   Vaping Use: Never used  Substance and Sexual Activity   Alcohol use: No   Drug use: Never   Sexual activity: Not on file  Other Topics Concern   Not on file  Social History Narrative   Not on file   Social Determinants of Health   Financial Resource Strain: Not on file  Food Insecurity: Not on file  Transportation Needs: Not on file  Physical Activity: Not on file  Stress: Not on file  Social Connections: Not on file  Intimate Partner Violence: Not on file    Family History: Family History  Problem Relation Age of Onset   Pancreatitis Brother        alcoholic pancreatitis   Pancreatic cancer Neg Hx    Colon cancer Neg Hx     Current Medications:  Current Outpatient Medications:    acetaminophen (TYLENOL) 500 MG tablet, Take 2 tablets (1,000 mg total) by mouth every 8 (eight) hours as needed for moderate pain, headache or fever. (Patient taking differently: Take 750-1,000 mg by mouth every 8 (eight) hours as needed for moderate pain, headache or fever.), Disp: , Rfl:    Aromatic Inhalants (VICKS VAPOINHALER) INHA, Inhale 1 puff into the lungs daily as needed (congestion)., Disp: , Rfl:    betamethasone dipropionate 0.05 % cream, Apply topically 2 (two) times daily., Disp: 30 g, Rfl: 3   buPROPion (WELLBUTRIN XL) 300 MG 24 hr tablet, Take 300 mg by mouth daily after breakfast., Disp: , Rfl:    busPIRone (BUSPAR) 10 MG tablet, Take 20 mg by mouth 2 (two) times daily., Disp: , Rfl:    calcium carbonate (TUMS - DOSED IN MG ELEMENTAL CALCIUM) 500 MG chewable tablet, Chew 1 tablet (200 mg of elemental calcium total) by mouth  2 (two) times daily as needed for indigestion or heartburn. (Patient taking differently: Chew 1-2 tablets by mouth 2 (two) times daily as needed for indigestion or heartburn.), Disp: , Rfl:    capecitabine (XELODA) 500 MG tablet, Take 3 tablets (1,500 mg total) by mouth 2 (two) times daily after a meal. Take Monday- Friday. Take only on days of radiation., Disp: 180 tablet, Rfl: 0   Cholecalciferol (VITAMIN D3) 125 MCG (5000 UT) CAPS, Take 5,000 Units by mouth every other day., Disp: , Rfl:    escitalopram (LEXAPRO) 20 MG tablet, Take 20 mg by mouth at bedtime., Disp: , Rfl:  fluorouracil CALGB 54008 1,920 mg/m2 in sodium chloride 0.9 % 150 mL, Inject 1,920 mg/m2 into the vein over 48 hr., Disp: , Rfl:    lactose free nutrition (BOOST) LIQD, Take 237 mLs by mouth 2 (two) times daily between meals., Disp: , Rfl:    lidocaine-prilocaine (EMLA) cream, Apply a small amount to port a cath site (do not rub in) and cover with plastic wrap 1 hour prior to infusion appointments, Disp: 30 g, Rfl: 3   linaclotide (LINZESS) 72 MCG capsule, Take 1 capsule (72 mcg total) by mouth daily before breakfast., Disp: 30 capsule, Rfl: 12   methocarbamol (ROBAXIN) 500 MG tablet, Take 1 tablet (500 mg total) by mouth every 8 (eight) hours as needed for muscle spasms., Disp: 15 tablet, Rfl: 0   metoprolol succinate (TOPROL-XL) 25 MG 24 hr tablet, Take 25 mg by mouth daily after breakfast., Disp: , Rfl:    mirtazapine (REMERON) 30 MG tablet, Take 30 mg by mouth at bedtime., Disp: , Rfl:    omeprazole (PRILOSEC) 40 MG capsule, Take 1 capsule (40 mg total) by mouth daily., Disp: 30 capsule, Rfl: 12   ondansetron (ZOFRAN-ODT) 8 MG disintegrating tablet, Take 1 tablet (8 mg total) by mouth every 8 (eight) hours as needed for nausea or vomiting., Disp: 30 tablet, Rfl: 0   Polyethyl Glycol-Propyl Glycol (SYSTANE OP), Place 1 drop into both eyes at bedtime., Disp: , Rfl:    potassium chloride SA (KLOR-CON M) 20 MEQ tablet, One po  bid x 3 days, then one po once a day, Disp: 20 tablet, Rfl: 0   QUEtiapine (SEROQUEL) 25 MG tablet, Take 25 mg by mouth 3 (three) times daily after meals., Disp: , Rfl:    vitamin B-12 (CYANOCOBALAMIN) 500 MCG tablet, Take 1 tablet (500 mcg total) by mouth daily., Disp: 30 tablet, Rfl: 2   gabapentin (NEURONTIN) 100 MG capsule, Take 1 capsule (100 mg total) by mouth 2 (two) times daily. (Patient not taking: Reported on 04/28/2022), Disp: 60 capsule, Rfl: 0   lactulose (CHRONULAC) 10 GM/15ML solution, Take 15 mLs (10 g total) by mouth at bedtime. Take 15 ml at bedtime every night to assist with bowel movements. If bowel movement has not occurred in 3-4 days take 15 ml every 3 hours until bowel movement has occurred. (Patient not taking: Reported on 04/28/2022), Disp: 236 mL, Rfl: 0   Allergies: Allergies  Allergen Reactions   Other Hives and Other (See Comments)    Cherry wood just cut- "smelled it and broke out"; cannot tolerate ANY cherry fragrances, either      Cherry Hives   Wound Dressing Adhesive Rash and Other (See Comments)    Band-Aids = local reaction    REVIEW OF SYSTEMS:   Review of Systems  Constitutional:  Positive for fatigue. Negative for chills and fever.  HENT:   Negative for lump/mass, mouth sores, nosebleeds, sore throat and trouble swallowing.   Eyes:  Negative for eye problems.  Respiratory:  Negative for cough and shortness of breath.   Cardiovascular:  Negative for chest pain, leg swelling and palpitations.  Gastrointestinal:  Positive for diarrhea and nausea. Negative for abdominal pain, constipation and vomiting.  Genitourinary:  Negative for bladder incontinence, difficulty urinating, dysuria, frequency, hematuria and nocturia.   Musculoskeletal:  Negative for arthralgias, back pain, flank pain, myalgias and neck pain.  Skin:  Negative for itching and rash.  Neurological:  Positive for dizziness and numbness. Negative for headaches.  Hematological:  Does not  bruise/bleed  easily.  Psychiatric/Behavioral:  Positive for sleep disturbance. Negative for depression and suicidal ideas. The patient is not nervous/anxious.   All other systems reviewed and are negative.    VITALS:   Blood pressure 120/71, pulse 94, temperature (!) 97.4 F (36.3 C), temperature source Tympanic, resp. rate 18, weight 125 lb 9.6 oz (57 kg), SpO2 97 %.  Wt Readings from Last 3 Encounters:  05/04/22 125 lb 9.6 oz (57 kg)  04/28/22 129 lb (58.5 kg)  04/27/22 129 lb 9.6 oz (58.8 kg)    Body mass index is 22.25 kg/m.  Performance status (ECOG): 1 - Symptomatic but completely ambulatory  PHYSICAL EXAM:   Physical Exam Vitals and nursing note reviewed. Exam conducted with a chaperone present.  Constitutional:      Appearance: Normal appearance.  Cardiovascular:     Rate and Rhythm: Normal rate and regular rhythm.     Pulses: Normal pulses.     Heart sounds: Normal heart sounds.  Pulmonary:     Effort: Pulmonary effort is normal.     Breath sounds: Normal breath sounds.  Abdominal:     Palpations: Abdomen is soft. There is no hepatomegaly, splenomegaly or mass.     Tenderness: There is abdominal tenderness.     Comments: Generalized tenderness with distention  Musculoskeletal:     Right lower leg: No edema.     Left lower leg: No edema.  Lymphadenopathy:     Cervical: No cervical adenopathy.     Right cervical: No superficial, deep or posterior cervical adenopathy.    Left cervical: No superficial, deep or posterior cervical adenopathy.     Upper Body:     Right upper body: No supraclavicular or axillary adenopathy.     Left upper body: No supraclavicular or axillary adenopathy.  Neurological:     General: No focal deficit present.     Mental Status: She is alert and oriented to person, place, and time.  Psychiatric:        Mood and Affect: Mood normal.        Behavior: Behavior normal.     LABS:      Latest Ref Rng & Units 05/04/2022    1:14 PM  04/28/2022   11:20 AM 04/27/2022    1:58 PM  CBC  WBC 4.0 - 10.5 K/uL 5.0  3.1  3.2   Hemoglobin 12.0 - 15.0 g/dL 16.1  09.6  04.5   Hematocrit 36.0 - 46.0 % 34.0  31.4  32.6   Platelets 150 - 400 K/uL 197  155  179       Latest Ref Rng & Units 05/04/2022    1:14 PM 04/28/2022   11:20 AM 04/27/2022    1:58 PM  CMP  Glucose 70 - 99 mg/dL 99  409  811   BUN 8 - 23 mg/dL 18  17  21    Creatinine 0.44 - 1.00 mg/dL 9.14  7.82  9.56   Sodium 135 - 145 mmol/L 135  134  135   Potassium 3.5 - 5.1 mmol/L 3.1  2.9  3.2   Chloride 98 - 111 mmol/L 100  104  105   CO2 22 - 32 mmol/L 26  24  24    Calcium 8.9 - 10.3 mg/dL 8.4  7.8  8.2   Total Protein 6.5 - 8.1 g/dL 5.8  5.3  5.7   Total Bilirubin 0.3 - 1.2 mg/dL 0.6  0.4  0.4   Alkaline Phos 38 - 126 U/L 80  78  85   AST 15 - 41 U/L 20  32  25   ALT 0 - 44 U/L 24  32  20      No results found for: "CEA1", "CEA" / No results found for: "CEA1", "CEA" No results found for: "PSA1" Lab Results  Component Value Date   ZOX096 10 01/12/2022   No results found for: "CAN125"  No results found for: "TOTALPROTELP", "ALBUMINELP", "A1GS", "A2GS", "BETS", "BETA2SER", "GAMS", "MSPIKE", "SPEI" Lab Results  Component Value Date   TIBC 154 (L) 07/14/2021   FERRITIN 273 07/14/2021   IRONPCTSAT 11 07/14/2021   No results found for: "LDH"   STUDIES:   CT Abdomen Pelvis W Contrast  Result Date: 04/28/2022 CLINICAL DATA:  Abdominal pain.  History of pancreatic cancer. EXAM: CT ABDOMEN AND PELVIS WITH CONTRAST TECHNIQUE: Multidetector CT imaging of the abdomen and pelvis was performed using the standard protocol following bolus administration of intravenous contrast. RADIATION DOSE REDUCTION: This exam was performed according to the departmental dose-optimization program which includes automated exposure control, adjustment of the mA and/or kV according to patient size and/or use of iterative reconstruction technique. CONTRAST:  OMNIPAQUE IOHEXOL 300 MG/ML   SOLN COMPARISON:  02/08/2022 FINDINGS: Lower chest: No acute pulmonary findings. No pleural effusion. The heart is normal in size. No pericardial effusion. Hepatobiliary: No hepatic lesions or intrahepatic biliary dilatation. The gallbladder is not identified and may be surgically absent or completely collapsed. Mild common bile duct dilatation. Pancreas: Severe atrophy of the pancreatic body and tail with marked pancreatic ductal dilatation with an abrupt cut off at the body head junction region without measurable tumor. Spleen: 2 separate spleens are noted.  No splenic lesions. Adrenals/Urinary Tract: Adrenal glands and kidneys are unremarkable and stable. Stable bilateral parapelvic renal cysts not requiring any further imaging evaluation or follow-up. The bladder is unremarkable. Moderate distension. Stomach/Bowel: Gastric wall thickening and surrounding interstitial changes could be due to gastritis or peptic ulcer disease or possibly secondary to radiation change. Interstitial changes surround the pancreatic head, second and third portions of the duodenum and the mesenteric vessels likely radiation related or inflammatory process. Vascular/Lymphatic: Stable aortic and branch vessel calcifications but no aneurysm or dissection. The major venous structures are patent. No mesenteric or retroperitoneal lymphadenopathy. Reproductive: The uterus and ovaries are unremarkable. Other: Small amount of free pelvic fluid. Musculoskeletal: No significant bony findings. 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 No worrisome bone lesions. IMPRESSION: 1. 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 2. Interstitial changes surround the pancreatic head, second and third portions of the duodenum and the mesenteric vessels likely radiation related or  inflammatory process. 3. Gastric wall thickening and surrounding interstitial changes could be due to gastritis or peptic ulcer disease or possibly secondary to radiation change. 4. Small amount of free pelvic fluid. 5. Aortic atherosclerosis. Aortic Atherosclerosis (ICD10-I70.0). Electronically Signed   By: Rudie Meyer M.D.   On: 04/28/2022 14:43

## 2022-05-04 ENCOUNTER — Inpatient Hospital Stay: Payer: Medicare Other

## 2022-05-04 ENCOUNTER — Inpatient Hospital Stay: Payer: Medicare Other | Admitting: Hematology

## 2022-05-04 ENCOUNTER — Encounter: Payer: Self-pay | Admitting: *Deleted

## 2022-05-04 ENCOUNTER — Encounter: Payer: Self-pay | Admitting: Hematology

## 2022-05-04 VITALS — BP 120/71 | HR 94 | Temp 97.4°F | Resp 18 | Wt 125.6 lb

## 2022-05-04 DIAGNOSIS — C250 Malignant neoplasm of head of pancreas: Secondary | ICD-10-CM

## 2022-05-04 DIAGNOSIS — Z95828 Presence of other vascular implants and grafts: Secondary | ICD-10-CM

## 2022-05-04 DIAGNOSIS — C253 Malignant neoplasm of pancreatic duct: Secondary | ICD-10-CM | POA: Diagnosis not present

## 2022-05-04 LAB — CBC WITH DIFFERENTIAL/PLATELET
Abs Immature Granulocytes: 0.2 10*3/uL — ABNORMAL HIGH (ref 0.00–0.07)
Band Neutrophils: 4 %
Basophils Absolute: 0.1 10*3/uL (ref 0.0–0.1)
Basophils Relative: 1 %
Eosinophils Absolute: 0.1 10*3/uL (ref 0.0–0.5)
Eosinophils Relative: 2 %
HCT: 34 % — ABNORMAL LOW (ref 36.0–46.0)
Hemoglobin: 11.2 g/dL — ABNORMAL LOW (ref 12.0–15.0)
Lymphocytes Relative: 14 %
Lymphs Abs: 0.7 10*3/uL (ref 0.7–4.0)
MCH: 32.4 pg (ref 26.0–34.0)
MCHC: 32.9 g/dL (ref 30.0–36.0)
MCV: 98.3 fL (ref 80.0–100.0)
Metamyelocytes Relative: 1 %
Monocytes Absolute: 0.5 10*3/uL (ref 0.1–1.0)
Monocytes Relative: 9 %
Myelocytes: 2 %
Neutro Abs: 3.6 10*3/uL (ref 1.7–7.7)
Neutrophils Relative %: 67 %
Platelets: 197 10*3/uL (ref 150–400)
RBC: 3.46 MIL/uL — ABNORMAL LOW (ref 3.87–5.11)
RDW: 16 % — ABNORMAL HIGH (ref 11.5–15.5)
WBC: 5 10*3/uL (ref 4.0–10.5)
nRBC: 0 % (ref 0.0–0.2)

## 2022-05-04 LAB — COMPREHENSIVE METABOLIC PANEL
ALT: 24 U/L (ref 0–44)
AST: 20 U/L (ref 15–41)
Albumin: 2.9 g/dL — ABNORMAL LOW (ref 3.5–5.0)
Alkaline Phosphatase: 80 U/L (ref 38–126)
Anion gap: 9 (ref 5–15)
BUN: 18 mg/dL (ref 8–23)
CO2: 26 mmol/L (ref 22–32)
Calcium: 8.4 mg/dL — ABNORMAL LOW (ref 8.9–10.3)
Chloride: 100 mmol/L (ref 98–111)
Creatinine, Ser: 0.7 mg/dL (ref 0.44–1.00)
GFR, Estimated: >60 mL/min (ref >60)
Glucose, Bld: 99 mg/dL (ref 70–99)
Potassium: 3.1 mmol/L — ABNORMAL LOW (ref 3.5–5.1)
Sodium: 135 mmol/L (ref 135–145)
Total Bilirubin: 0.6 mg/dL (ref 0.3–1.2)
Total Protein: 5.8 g/dL — ABNORMAL LOW (ref 6.5–8.1)

## 2022-05-04 LAB — MAGNESIUM: Magnesium: 1.8 mg/dL (ref 1.7–2.4)

## 2022-05-04 MED ORDER — POTASSIUM CHLORIDE IN NACL 20-0.9 MEQ/L-% IV SOLN: INTRAVENOUS | Status: DC

## 2022-05-04 MED ORDER — SODIUM CHLORIDE 0.9% FLUSH
10.0000 mL | Freq: Once | INTRAVENOUS | Status: AC
  Administered 2022-05-04: 10 mL via INTRAVENOUS

## 2022-05-04 MED ORDER — MAGNESIUM SULFATE 2 GM/50ML IV SOLN
2.0000 g | Freq: Once | INTRAVENOUS | Status: AC
  Administered 2022-05-04: 2 g via INTRAVENOUS
  Filled 2022-05-04: qty 50

## 2022-05-04 MED ORDER — POTASSIUM CHLORIDE IN NACL 20-0.9 MEQ/L-% IV SOLN
Freq: Once | INTRAVENOUS | Status: AC
  Filled 2022-05-04: qty 1000

## 2022-05-04 MED ORDER — HEPARIN SOD (PORK) LOCK FLUSH 100 UNIT/ML IV SOLN
500.0000 [IU] | Freq: Once | INTRAVENOUS | Status: AC
  Administered 2022-05-04: 500 [IU] via INTRAVENOUS

## 2022-05-04 NOTE — Progress Notes (Signed)
Patient presents today for fluids per providers order.  Vital stable.  Per [name] okay to start House fluids prior to lab results.    Stable during infusion without adverse affects.  Vital signs stable.  No complaints at this time.  Discharge from clinic via wheelchair in stable condition.  Alert and oriented X 3.  Follow up with [facility] as scheduled.

## 2022-05-04 NOTE — Patient Instructions (Signed)
[facility]  Discharge Instructions: Thank you for choosing [facility] to provide your oncology and hematology care.  If you have a lab appointment with the Cancer Center - please note that after April 8th, 2024, all labs will be drawn in the cancer center.  You do not have to check in or register with the main entrance as you have in the past but will complete your check-in in the cancer center.  Wear comfortable clothing and clothing appropriate for easy access to any Portacath or PICC line.   We strive to give you quality time with your provider. You may need to reschedule your appointment if you arrive late (15 or more minutes).  Arriving late affects you and other patients whose appointments are after yours.  Also, if you miss three or more appointments without notifying the office, you may be dismissed from the clinic at the provider's discretion.      For prescription refill requests, have your pharmacy contact our office and allow 72 hours for refills to be completed.    Today you received the following chemotherapy and/or immunotherapy agents House fluids      To help prevent nausea and vomiting after your treatment, we encourage you to take your nausea medication as directed.  BELOW ARE SYMPTOMS THAT SHOULD BE REPORTED IMMEDIATELY: *FEVER GREATER THAN 100.4 F (38 C) OR HIGHER *CHILLS OR SWEATING *NAUSEA AND VOMITING THAT IS NOT CONTROLLED WITH YOUR NAUSEA MEDICATION *UNUSUAL SHORTNESS OF BREATH *UNUSUAL BRUISING OR BLEEDING *URINARY PROBLEMS (pain or burning when urinating, or frequent urination) *BOWEL PROBLEMS (unusual diarrhea, constipation, pain near the anus) TENDERNESS IN MOUTH AND THROAT WITH OR WITHOUT PRESENCE OF ULCERS (sore throat, sores in mouth, or a toothache) UNUSUAL RASH, SWELLING OR PAIN  UNUSUAL VAGINAL DISCHARGE OR ITCHING   Items with * indicate a potential emergency and should be followed up as soon as possible or go to  the Emergency Department if any problems should occur.  Please show the CHEMOTHERAPY ALERT CARD or IMMUNOTHERAPY ALERT CARD at check-in to the Emergency Department and triage nurse.  Should you have questions after your visit or need to cancel or reschedule your appointment, please contact [facility] AT [facility] [phone]  and follow the prompts.  Office hours are 8:00 a.m. to 4:30 p.m. Monday - Friday. Please note that voicemails left after 4:00 p.m. may not be returned until the following business day.  We are closed weekends and major holidays. You have access to a nurse at all times for urgent questions. Please call the main number to the clinic [phone] and follow the prompts.  For any non-urgent questions, you may also contact your provider using MyChart. We now offer e-Visits for anyone 80 and older to request care online for non-urgent symptoms. For details visit [email or web address].   Also download the MyChart app! Go to the app store, search "MyChart", open the app, select [facility], and log in with your MyChart username and password.

## 2022-05-04 NOTE — Patient Instructions (Signed)
[facility]  Discharge Instructions  You were seen and examined today by [name].  [name] discussed your most recent lab work and CT scan which revealed that everything looks improved since your Emergency Department visit. Except your protein is low.   [name] wants you to get Boost plus and drink 3 boost daily if you are eating some. If you are not eating then drink 5 boost plus daily but be careful for diarrhea due to the sugar content in the boost being high.  Hold the Xeloda tomorrow and Thursday. We will have you scheduled for IV fluids and labs.  Follow-up as scheduled.    Thank you for choosing [facility] to provide your oncology and hematology care.   To afford each patient quality time with our provider, please arrive at least 15 minutes before your scheduled appointment time. You may need to reschedule your appointment if you arrive late (10 or more minutes). Arriving late affects you and other patients whose appointments are after yours.  Also, if you miss three or more appointments without notifying the office, you may be dismissed from the clinic at the provider's discretion.    Again, thank you for choosing [facility].  Our hope is that these requests will decrease the amount of time that you wait before being seen by our physicians.   If you have a lab appointment with the Cancer Center - please note that after April 8th, all labs will be drawn in the cancer center.  You do not have to check in or register with the main entrance as you have in the past but will complete your check-in at the cancer center.            _____________________________________________________________  Should you have questions after your visit to [facility], please contact our office at [phone] and follow the prompts.  Our office hours are 8:00 a.m. to 4:30 p.m. Monday - Thursday and 8:00  a.m. to 2:30 p.m. Friday.  Please note that voicemails left after 4:00 p.m. may not be returned until the following business day.  We are closed weekends and all major holidays.  You do have access to a nurse 24-7, just call the main number to the clinic [phone] and do not press any options, hold on the line and a nurse will answer the phone.    For prescription refill requests, have your pharmacy contact our office and allow 72 hours.    Masks are no longer required in the cancer centers. If you would like for your care team to wear a mask while they are taking care of you, please let them know. You may have one support person who is at least 79 years old accompany you for your appointments.

## 2022-05-04 NOTE — Progress Notes (Signed)
Copy of recent CT AP faxed to [facility] per their request.  She is a mutual patient.  F [phone] P - [phone].

## 2022-05-04 NOTE — Progress Notes (Signed)
Per Dr.Katragadda, ok to start hydration fluids with potassium with out waiting for lab results.

## 2022-05-05 ENCOUNTER — Observation Stay (HOSPITAL_COMMUNITY)
Admission: EM | Admit: 2022-05-05 | Discharge: 2022-05-09 | Disposition: A | Discharge status: Home Health | Payer: Medicare Other | Attending: Internal Medicine | Admitting: Internal Medicine

## 2022-05-05 ENCOUNTER — Emergency Department (HOSPITAL_COMMUNITY): Payer: Medicare Other

## 2022-05-05 ENCOUNTER — Encounter (HOSPITAL_COMMUNITY): Payer: Self-pay | Admitting: Emergency Medicine

## 2022-05-05 ENCOUNTER — Other Ambulatory Visit: Payer: Self-pay

## 2022-05-05 DIAGNOSIS — Z79899 Other long term (current) drug therapy: Secondary | ICD-10-CM | POA: Diagnosis not present

## 2022-05-05 DIAGNOSIS — K560 Paralytic ileus: Secondary | ICD-10-CM | POA: Diagnosis not present

## 2022-05-05 DIAGNOSIS — M6281 Muscle weakness (generalized): Secondary | ICD-10-CM | POA: Insufficient documentation

## 2022-05-05 DIAGNOSIS — C259 Malignant neoplasm of pancreas, unspecified: Secondary | ICD-10-CM | POA: Diagnosis not present

## 2022-05-05 DIAGNOSIS — Z86711 Personal history of pulmonary embolism: Secondary | ICD-10-CM | POA: Diagnosis not present

## 2022-05-05 DIAGNOSIS — R2689 Other abnormalities of gait and mobility: Secondary | ICD-10-CM | POA: Insufficient documentation

## 2022-05-05 DIAGNOSIS — R2681 Unsteadiness on feet: Secondary | ICD-10-CM | POA: Insufficient documentation

## 2022-05-05 DIAGNOSIS — R197 Diarrhea, unspecified: Secondary | ICD-10-CM

## 2022-05-05 DIAGNOSIS — I10 Essential (primary) hypertension: Secondary | ICD-10-CM | POA: Diagnosis not present

## 2022-05-05 DIAGNOSIS — F32A Depression, unspecified: Secondary | ICD-10-CM | POA: Diagnosis present

## 2022-05-05 DIAGNOSIS — R112 Nausea with vomiting, unspecified: Secondary | ICD-10-CM | POA: Diagnosis present

## 2022-05-05 DIAGNOSIS — K219 Gastro-esophageal reflux disease without esophagitis: Secondary | ICD-10-CM | POA: Diagnosis present

## 2022-05-05 DIAGNOSIS — E876 Hypokalemia: Secondary | ICD-10-CM | POA: Diagnosis not present

## 2022-05-05 LAB — COMPREHENSIVE METABOLIC PANEL
ALT: 21 U/L (ref 0–44)
AST: 22 U/L (ref 15–41)
Albumin: 2.6 g/dL — ABNORMAL LOW (ref 3.5–5.0)
Alkaline Phosphatase: 67 U/L (ref 38–126)
Anion gap: 7 (ref 5–15)
BUN: 13 mg/dL (ref 8–23)
CO2: 26 mmol/L (ref 22–32)
Calcium: 8.1 mg/dL — ABNORMAL LOW (ref 8.9–10.3)
Chloride: 101 mmol/L (ref 98–111)
Creatinine, Ser: 0.57 mg/dL (ref 0.44–1.00)
GFR, Estimated: >60 mL/min (ref >60)
Glucose, Bld: 117 mg/dL — ABNORMAL HIGH (ref 70–99)
Potassium: 3.6 mmol/L (ref 3.5–5.1)
Sodium: 134 mmol/L — ABNORMAL LOW (ref 135–145)
Total Bilirubin: 0.2 mg/dL — ABNORMAL LOW (ref 0.3–1.2)
Total Protein: 5.3 g/dL — ABNORMAL LOW (ref 6.5–8.1)

## 2022-05-05 LAB — CBC
HCT: 31.8 % — ABNORMAL LOW (ref 36.0–46.0)
Hemoglobin: 10.4 g/dL — ABNORMAL LOW (ref 12.0–15.0)
MCH: 31.9 pg (ref 26.0–34.0)
MCHC: 32.7 g/dL (ref 30.0–36.0)
MCV: 97.5 fL (ref 80.0–100.0)
Platelets: 181 10*3/uL (ref 150–400)
RBC: 3.26 MIL/uL — ABNORMAL LOW (ref 3.87–5.11)
RDW: 16.4 % — ABNORMAL HIGH (ref 11.5–15.5)
WBC: 4.9 10*3/uL (ref 4.0–10.5)
nRBC: 0 % (ref 0.0–0.2)

## 2022-05-05 LAB — LACTIC ACID, PLASMA: Lactic Acid, Venous: 1.1 mmol/L (ref 0.5–1.9)

## 2022-05-05 LAB — LIPASE, BLOOD: Lipase: 17 U/L (ref 11–51)

## 2022-05-05 MED ORDER — FENTANYL CITRATE PF 50 MCG/ML IJ SOSY
50.0000 ug | PREFILLED_SYRINGE | Freq: Once | INTRAMUSCULAR | Status: AC
  Administered 2022-05-05: 50 ug via INTRAVENOUS
  Filled 2022-05-05: qty 1

## 2022-05-05 MED ORDER — SODIUM CHLORIDE 0.9 % IV BOLUS
500.0000 mL | Freq: Once | INTRAVENOUS | Status: AC
  Administered 2022-05-05: 500 mL via INTRAVENOUS

## 2022-05-05 MED ORDER — SIMETHICONE 40 MG/0.6ML PO SUSP
40.0000 mg | Freq: Once | ORAL | Status: AC
  Administered 2022-05-06: 40 mg via ORAL
  Filled 2022-05-05: qty 0.6

## 2022-05-05 MED ORDER — ONDANSETRON HCL 4 MG/2ML IJ SOLN
4.0000 mg | Freq: Once | INTRAMUSCULAR | Status: AC
  Administered 2022-05-05: 4 mg via INTRAVENOUS
  Filled 2022-05-05: qty 2

## 2022-05-05 MED ORDER — IOHEXOL 300 MG/ML  SOLN
100.0000 mL | Freq: Once | INTRAMUSCULAR | Status: AC | PRN
  Administered 2022-05-05: 100 mL via INTRAVENOUS

## 2022-05-05 NOTE — ED Notes (Signed)
Introduced myself to patient. Patient reports projectile vomiting and feeling unwell. Patient received her last dose of chemo this week. Patient states normally she receives it through her port, but the gave it to her orally and since then she has felt sick.

## 2022-05-05 NOTE — ED Triage Notes (Signed)
Pt complains of vomiting started a few hours ago. Pt had diarrhea 1 week ago and was seen here and treated. Pt has been taking immodium for diarrhea. Last bowel movement 2 days ago. Pt feels bloated.    Pt took chemo pill yesterday morning. Saw oncologist yesterday and told them she was nauseated but wasn't vomiting at that time. Today the vomiting started.

## 2022-05-05 NOTE — ED Notes (Signed)
Port accessed

## 2022-05-05 NOTE — ED Notes (Signed)
With CT 

## 2022-05-06 ENCOUNTER — Observation Stay (HOSPITAL_COMMUNITY): Payer: Medicare Other

## 2022-05-06 ENCOUNTER — Other Ambulatory Visit: Payer: Medicare Other

## 2022-05-06 ENCOUNTER — Ambulatory Visit: Payer: Medicare Other

## 2022-05-06 DIAGNOSIS — F32A Depression, unspecified: Secondary | ICD-10-CM | POA: Diagnosis not present

## 2022-05-06 DIAGNOSIS — K2100 Gastro-esophageal reflux disease with esophagitis, without bleeding: Secondary | ICD-10-CM

## 2022-05-06 DIAGNOSIS — K560 Paralytic ileus: Secondary | ICD-10-CM | POA: Diagnosis not present

## 2022-05-06 DIAGNOSIS — I10 Essential (primary) hypertension: Secondary | ICD-10-CM | POA: Diagnosis not present

## 2022-05-06 DIAGNOSIS — R197 Diarrhea, unspecified: Secondary | ICD-10-CM

## 2022-05-06 DIAGNOSIS — C250 Malignant neoplasm of head of pancreas: Secondary | ICD-10-CM

## 2022-05-06 DIAGNOSIS — R112 Nausea with vomiting, unspecified: Secondary | ICD-10-CM | POA: Diagnosis not present

## 2022-05-06 DIAGNOSIS — C259 Malignant neoplasm of pancreas, unspecified: Secondary | ICD-10-CM

## 2022-05-06 LAB — COMPREHENSIVE METABOLIC PANEL
ALT: 19 U/L (ref 0–44)
AST: 17 U/L (ref 15–41)
Albumin: 2.3 g/dL — ABNORMAL LOW (ref 3.5–5.0)
Alkaline Phosphatase: 63 U/L (ref 38–126)
Anion gap: 4 — ABNORMAL LOW (ref 5–15)
BUN: 12 mg/dL (ref 8–23)
CO2: 27 mmol/L (ref 22–32)
Calcium: 7.9 mg/dL — ABNORMAL LOW (ref 8.9–10.3)
Chloride: 102 mmol/L (ref 98–111)
Creatinine, Ser: 0.52 mg/dL (ref 0.44–1.00)
GFR, Estimated: >60 mL/min (ref >60)
Glucose, Bld: 102 mg/dL — ABNORMAL HIGH (ref 70–99)
Potassium: 3.9 mmol/L (ref 3.5–5.1)
Sodium: 133 mmol/L — ABNORMAL LOW (ref 135–145)
Total Bilirubin: 0.5 mg/dL (ref 0.3–1.2)
Total Protein: 4.7 g/dL — ABNORMAL LOW (ref 6.5–8.1)

## 2022-05-06 LAB — CBC WITH DIFFERENTIAL/PLATELET
Abs Immature Granulocytes: 0.06 10*3/uL (ref 0.00–0.07)
Basophils Absolute: 0 10*3/uL (ref 0.0–0.1)
Basophils Relative: 1 %
Eosinophils Absolute: 0.1 10*3/uL (ref 0.0–0.5)
Eosinophils Relative: 1 %
HCT: 30 % — ABNORMAL LOW (ref 36.0–46.0)
Hemoglobin: 9.8 g/dL — ABNORMAL LOW (ref 12.0–15.0)
Immature Granulocytes: 2 %
Lymphocytes Relative: 7 %
Lymphs Abs: 0.3 10*3/uL — ABNORMAL LOW (ref 0.7–4.0)
MCH: 32.2 pg (ref 26.0–34.0)
MCHC: 32.7 g/dL (ref 30.0–36.0)
MCV: 98.7 fL (ref 80.0–100.0)
Monocytes Absolute: 0.9 10*3/uL (ref 0.1–1.0)
Monocytes Relative: 23 %
Neutro Abs: 2.7 10*3/uL (ref 1.7–7.7)
Neutrophils Relative %: 66 %
Platelets: 172 10*3/uL (ref 150–400)
RBC: 3.04 MIL/uL — ABNORMAL LOW (ref 3.87–5.11)
RDW: 16.6 % — ABNORMAL HIGH (ref 11.5–15.5)
WBC: 4.1 10*3/uL (ref 4.0–10.5)
nRBC: 0 % (ref 0.0–0.2)

## 2022-05-06 LAB — URINALYSIS, ROUTINE W REFLEX MICROSCOPIC
Bilirubin Urine: NEGATIVE
Bilirubin Urine: NEGATIVE
Glucose, UA: NEGATIVE mg/dL
Glucose, UA: NEGATIVE mg/dL
Hgb urine dipstick: NEGATIVE
Hgb urine dipstick: NEGATIVE
Ketones, ur: NEGATIVE mg/dL
Ketones, ur: NEGATIVE mg/dL
Leukocytes,Ua: NEGATIVE
Nitrite: NEGATIVE
Nitrite: NEGATIVE
Protein, ur: NEGATIVE mg/dL
Protein, ur: NEGATIVE mg/dL
Renal Epithelial: 2
Specific Gravity, Urine: >1.046 — ABNORMAL HIGH (ref 1.005–1.030)
Specific Gravity, Urine: 1.017 (ref 1.005–1.030)
pH: 5 (ref 5.0–8.0)
pH: 5 (ref 5.0–8.0)

## 2022-05-06 LAB — MAGNESIUM: Magnesium: 2 mg/dL (ref 1.7–2.4)

## 2022-05-06 MED ORDER — METHOCARBAMOL 500 MG PO TABS: 500.0000 mg | ORAL_TABLET | Freq: Three times a day (TID) | ORAL | Status: DC | PRN

## 2022-05-06 MED ORDER — LACTATED RINGERS IV SOLN: INTRAVENOUS | Status: DC

## 2022-05-06 MED ORDER — BISACODYL 10 MG RE SUPP
10.0000 mg | Freq: Once | RECTAL | Status: AC
  Administered 2022-05-06: 10 mg via RECTAL
  Filled 2022-05-06: qty 1

## 2022-05-06 MED ORDER — PANTOPRAZOLE SODIUM 40 MG IV SOLR
40.0000 mg | INTRAVENOUS | Status: DC
  Administered 2022-05-06 – 2022-05-09 (×4): 40 mg via INTRAVENOUS
  Filled 2022-05-06 (×4): qty 10

## 2022-05-06 MED ORDER — METOPROLOL SUCCINATE ER 25 MG PO TB24
25.0000 mg | ORAL_TABLET | Freq: Every day | ORAL | Status: DC
  Administered 2022-05-06: 25 mg via ORAL
  Filled 2022-05-06: qty 1

## 2022-05-06 MED ORDER — ACETAMINOPHEN 650 MG RE SUPP: 650.0000 mg | Freq: Four times a day (QID) | RECTAL | Status: DC | PRN

## 2022-05-06 MED ORDER — MORPHINE SULFATE (PF) 2 MG/ML IV SOLN
2.0000 mg | INTRAVENOUS | Status: DC | PRN
  Filled 2022-05-06: qty 1

## 2022-05-06 MED ORDER — ESCITALOPRAM OXALATE 10 MG PO TABS
20.0000 mg | ORAL_TABLET | Freq: Every day | ORAL | Status: DC
  Administered 2022-05-06 – 2022-05-08 (×3): 20 mg via ORAL
  Filled 2022-05-06 (×3): qty 2

## 2022-05-06 MED ORDER — NUTRISOURCE FIBER PO PACK
1.0000 | PACK | Freq: Two times a day (BID) | ORAL | Status: DC
  Administered 2022-05-07 – 2022-05-09 (×4): 1 via ORAL
  Filled 2022-05-06 (×8): qty 1

## 2022-05-06 MED ORDER — ACETAMINOPHEN 325 MG PO TABS
650.0000 mg | ORAL_TABLET | Freq: Four times a day (QID) | ORAL | Status: DC | PRN
  Administered 2022-05-08: 650 mg via ORAL
  Filled 2022-05-06: qty 2

## 2022-05-06 MED ORDER — SIMETHICONE 80 MG PO CHEW
80.0000 mg | CHEWABLE_TABLET | Freq: Four times a day (QID) | ORAL | Status: DC
  Administered 2022-05-06 – 2022-05-09 (×11): 80 mg via ORAL
  Filled 2022-05-06 (×11): qty 1

## 2022-05-06 MED ORDER — MIRTAZAPINE 15 MG PO TABS
30.0000 mg | ORAL_TABLET | Freq: Every day | ORAL | Status: DC
  Administered 2022-05-06 – 2022-05-08 (×3): 30 mg via ORAL
  Filled 2022-05-06 (×3): qty 2

## 2022-05-06 MED ORDER — METOCLOPRAMIDE HCL 5 MG/ML IJ SOLN
10.0000 mg | Freq: Once | INTRAMUSCULAR | Status: AC
  Administered 2022-05-06: 10 mg via INTRAVENOUS
  Filled 2022-05-06: qty 2

## 2022-05-06 MED ORDER — PANTOPRAZOLE SODIUM 40 MG PO TBEC: 40.0000 mg | DELAYED_RELEASE_TABLET | Freq: Every day | ORAL | Status: DC

## 2022-05-06 MED ORDER — HEPARIN SODIUM (PORCINE) 5000 UNIT/ML IJ SOLN
5000.0000 [IU] | Freq: Three times a day (TID) | INTRAMUSCULAR | Status: DC
  Administered 2022-05-06 – 2022-05-09 (×11): 5000 [IU] via SUBCUTANEOUS
  Filled 2022-05-06 (×11): qty 1

## 2022-05-06 MED ORDER — BUSPIRONE HCL 5 MG PO TABS
20.0000 mg | ORAL_TABLET | Freq: Two times a day (BID) | ORAL | Status: DC
  Administered 2022-05-06 – 2022-05-07 (×3): 20 mg via ORAL
  Administered 2022-05-07: 10 mg via ORAL
  Administered 2022-05-08 – 2022-05-09 (×3): 20 mg via ORAL
  Filled 2022-05-06 (×7): qty 4

## 2022-05-06 MED ORDER — QUETIAPINE FUMARATE 25 MG PO TABS
25.0000 mg | ORAL_TABLET | Freq: Three times a day (TID) | ORAL | Status: DC
  Administered 2022-05-06 – 2022-05-09 (×11): 25 mg via ORAL
  Filled 2022-05-06 (×11): qty 1

## 2022-05-06 MED ORDER — ONDANSETRON HCL 4 MG PO TABS: 4.0000 mg | ORAL_TABLET | Freq: Four times a day (QID) | ORAL | Status: DC | PRN

## 2022-05-06 MED ORDER — DICYCLOMINE HCL 10 MG PO CAPS
10.0000 mg | ORAL_CAPSULE | Freq: Once | ORAL | Status: AC
  Administered 2022-05-06: 10 mg via ORAL
  Filled 2022-05-06: qty 1

## 2022-05-06 MED ORDER — OXYCODONE HCL 5 MG PO TABS: 5.0000 mg | ORAL_TABLET | ORAL | Status: DC | PRN

## 2022-05-06 MED ORDER — ONDANSETRON HCL 4 MG/2ML IJ SOLN
4.0000 mg | Freq: Four times a day (QID) | INTRAMUSCULAR | Status: DC | PRN
  Administered 2022-05-08: 4 mg via INTRAVENOUS
  Filled 2022-05-06 (×2): qty 2

## 2022-05-06 MED ORDER — BUPROPION HCL ER (XL) 300 MG PO TB24
300.0000 mg | ORAL_TABLET | Freq: Every day | ORAL | Status: DC
  Administered 2022-05-06 – 2022-05-09 (×4): 300 mg via ORAL
  Filled 2022-05-06 (×3): qty 1
  Filled 2022-05-06: qty 2

## 2022-05-06 NOTE — Assessment & Plan Note (Signed)
Continue metoprolol. 

## 2022-05-06 NOTE — Assessment & Plan Note (Signed)
-   Continue Wellbutrin, Lexapro - Continue BuSpar and Seroquel - Continue to monitor

## 2022-05-06 NOTE — Assessment & Plan Note (Signed)
-   Holding Xeloda - Consult oncology

## 2022-05-06 NOTE — TOC Progression Note (Signed)
  Transition of Care [facility]) Screening Note   Patient Details  Name: Afsheen Lewallen Date of Birth: 10/13/1945   Transition of Care [facility]) CM/SW Contact:    Elliot Gault, LCSW Phone Number: 05/06/2022, 10:53 AM    Transition of Care Department [facility]) has reviewed patient and no TOC needs have been identified at this time. We will continue to monitor patient advancement through interdisciplinary progression rounds. If new patient transition needs arise, please place a TOC consult.

## 2022-05-06 NOTE — ED Provider Notes (Signed)
[place] EMERGENCY DEPARTMENT AT [facility] Provider Note   CSN: [id number] Arrival date & time: 05/05/22  2103     History  Chief Complaint  Patient presents with   Emesis    Sheryl Bender is a 77 y.o. female.  77 year old female who presents the ER today secondary to emesis.  Patient has a history of pancreatic cancer status post cystectomy.  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  She is getting radiation and chemo currently.  She states that she had diarrhea about a week ago and was taken some Imodium.  Her diarrhea improved but now she has diminished bowel movements, abdominal bloating and abdominal pain.  Last bowel movement was earlier today but not normal.  Last time she passed gas was earlier today as well.  No fevers.  Had episode of nonbloody nonbilious emesis after trying to drink some fluids.   Emesis      Home Medications Prior to Admission medications   Medication Sig Start Date End Date Taking? Authorizing Provider  acetaminophen (TYLENOL) 500 MG tablet Take 2 tablets (1,000 mg total) by mouth every 8 (eight) hours as needed for moderate pain, headache or fever. Patient taking differently: Take 750-1,000 mg by mouth every 8 (eight) hours as needed for moderate pain, headache or fever. 07/21/21   Vassie LollMadera, Carlos, MD  Aromatic Inhalants (VICKS VAPOINHALER) INHA Inhale 1 puff into the lungs daily as needed (congestion).    [provider]  betamethasone dipropionate 0.05 % cream Apply topically 2 (two) times daily. 04/20/22   Doreatha MassedKatragadda, Sreedhar, MD  buPROPion (WELLBUTRIN XL) 300 MG 24 hr tablet Take 300 mg by mouth daily after breakfast. 06/15/21   [provider]  busPIRone (BUSPAR) 10 MG tablet Take 20 mg by mouth 2 (two) times daily. 01/27/21   [provider]  calcium carbonate (TUMS - DOSED IN MG ELEMENTAL CALCIUM) 500 MG chewable tablet Chew 1 tablet (200 mg of elemental calcium total) by mouth 2 (two) times daily as  needed for indigestion or heartburn. Patient taking differently: Chew 1-2 tablets by mouth 2 (two) times daily as needed for indigestion or heartburn. 08/31/21   Lewie ChamberGirguis, David, MD  capecitabine (XELODA) 500 MG tablet Take 3 tablets (1,500 mg total) by mouth 2 (two) times daily after a meal. Take Monday- Friday. Take only on days of radiation. 03/03/22   Doreatha MassedKatragadda, Sreedhar, MD  Cholecalciferol (VITAMIN D3) 125 MCG (5000 UT) CAPS Take 5,000 Units by mouth every other day.    [provider]  escitalopram (LEXAPRO) 20 MG tablet Take 20 mg by mouth at bedtime.    [provider]  fluorouracil CALGB [id number] 1,920 mg/m2 in sodium chloride 0.9 % 150 mL Inject 1,920 mg/m2 into the vein over 48 hr.    [provider]  gabapentin (NEURONTIN) 100 MG capsule Take 1 capsule (100 mg total) by mouth 2 (two) times daily. Patient not taking: Reported on 04/28/2022 04/14/22   Doreatha MassedKatragadda, Sreedhar, MD  lactose free nutrition (BOOST) LIQD Take 237 mLs by mouth 2 (two) times daily between meals.    [provider]  lactulose (CHRONULAC) 10 GM/15ML solution Take 15 mLs (10 g total) by mouth at bedtime. Take 15 ml at bedtime every night to assist with bowel movements. If bowel movement has not occurred in 3-4 days take 15 ml every 3 hours until bowel movement has occurred. Patient not taking: Reported on 04/28/2022 10/02/21   Carnella GuadalajaraPennington, Rebekah M, PA-C  lidocaine-prilocaine (EMLA) cream Apply a  small amount to port a cath site (do not rub in) and cover with plastic wrap 1 hour prior to infusion appointments 09/15/21   Doreatha Massed, MD  linaclotide [facility]) 72 MCG capsule Take 1 capsule (72 mcg total) by mouth daily before breakfast. 10/08/21   Mansouraty, Netty Starring., MD  methocarbamol (ROBAXIN) 500 MG tablet Take 1 tablet (500 mg total) by mouth every 8 (eight) hours as needed for muscle spasms. 02/19/22   Fritzi Mandes, MD  metoprolol succinate (TOPROL-XL) 25 MG 24 hr tablet Take 25 mg by  mouth daily after breakfast.    [provider]  mirtazapine (REMERON) 30 MG tablet Take 30 mg by mouth at bedtime. 06/15/21   [provider]  omeprazole (PRILOSEC) 40 MG capsule Take 1 capsule (40 mg total) by mouth daily. 10/08/21   Mansouraty, Netty Starring., MD  ondansetron (ZOFRAN-ODT) 8 MG disintegrating tablet Take 1 tablet (8 mg total) by mouth every 8 (eight) hours as needed for nausea or vomiting. 07/21/21   Vassie Loll, MD  Polyethyl Glycol-Propyl Glycol (SYSTANE OP) Place 1 drop into both eyes at bedtime.    [provider]  potassium chloride SA (KLOR-CON M) 20 MEQ tablet One po bid x 3 days, then one po once a day 04/28/22   Cathren Laine, MD  QUEtiapine (SEROQUEL) 25 MG tablet Take 25 mg by mouth 3 (three) times daily after meals.    [provider]  vitamin B-12 (CYANOCOBALAMIN) 500 MCG tablet Take 1 tablet (500 mcg total) by mouth daily. 07/22/21   Vassie Loll, MD      Allergies    Other, Cherry, and Wound dressing adhesive    Review of Systems   Review of Systems  Gastrointestinal:  Positive for vomiting.    Physical Exam Updated Vital Signs BP 113/61   Pulse 85   Temp 99.1 F (37.3 C) (Oral)   Resp 15   Wt 56.9 kg   SpO2 96%   BMI 22.22 kg/m  Physical Exam Vitals and nursing note reviewed.  Constitutional:      Appearance: She is well-developed.  HENT:     Head: Normocephalic and atraumatic.     Mouth/Throat:     Mouth: Mucous membranes are moist.     Pharynx: Oropharynx is clear.  Eyes:     Pupils: Pupils are equal, round, and reactive to light.  Cardiovascular:     Rate and Rhythm: Normal rate and regular rhythm.  Pulmonary:     Effort: No respiratory distress.     Breath sounds: No stridor.  Abdominal:     General: Abdomen is flat. There is no distension.     Tenderness: There is abdominal tenderness (Mostly in the epigastric area but also mild distention.).  Musculoskeletal:        General: No swelling. Normal  range of motion.     Cervical back: Normal range of motion.  Skin:    General: Skin is warm and dry.  Neurological:     General: No focal deficit present.     Mental Status: She is alert.     ED Results / Procedures / Treatments   Labs (all labs ordered are listed, but only abnormal results are displayed) Labs Reviewed  COMPREHENSIVE METABOLIC PANEL - Abnormal; Notable for the following components:      Result Value   Sodium 134 (*)    Glucose, Bld 117 (*)    Calcium 8.1 (*)    Total Protein 5.3 (*)  Albumin 2.6 (*)    Total Bilirubin 0.2 (*)    All other components within normal limits  CBC - Abnormal; Notable for the following components:   RBC 3.26 (*)    Hemoglobin 10.4 (*)    HCT 31.8 (*)    RDW 16.4 (*)    All other components within normal limits  URINALYSIS, ROUTINE W REFLEX MICROSCOPIC - Abnormal; Notable for the following components:   APPearance HAZY (*)    Specific Gravity, Urine >1.046 (*)    Leukocytes,Ua SMALL (*)    Bacteria, UA RARE (*)    All other components within normal limits  LIPASE, BLOOD  LACTIC ACID, PLASMA    EKG None  Radiology CT ABDOMEN PELVIS W CONTRAST  Result Date: 05/06/2022 CLINICAL DATA:  Abdominal pain, acute, nonlocalized. Vomiting. History of pancreatic cancer. EXAM: CT ABDOMEN AND PELVIS WITH CONTRAST TECHNIQUE: Multidetector CT imaging of the abdomen and pelvis was performed using the standard protocol following bolus administration of intravenous contrast. RADIATION DOSE REDUCTION: This exam was performed according to the departmental dose-optimization program which includes automated exposure control, adjustment of the mA and/or kV according to patient size and/or use of iterative reconstruction technique. CONTRAST:  OMNIPAQUE IOHEXOL 300 MG/ML  SOLN COMPARISON:  04/28/2022 FINDINGS: Lower chest: Trace bilateral pleural effusion. Left base atelectasis. Hepatobiliary: Prior cholecystectomy. No biliary ductal dilatation or  suspicious focal hepatic abnormality. Pancreas: Severe atrophy of the pancreatic body and tail with ductal dilatation. Appearance is stable since prior study. Spleen: Two separate spleens again noted. No focal abnormality. No change. Adrenals/Urinary Tract: Bilateral renal parapelvic cysts, stable. No hydronephrosis. Adrenal glands and urinary bladder unremarkable. Stomach/Bowel: Dilated small bowel loops into the pelvis. Small bowel is fairly dilated to the ileocecal valve without significant caliber change. Moderate stool burden throughout the colon which is normal caliber. Stomach decompressed. Vascular/Lymphatic: Aortic atherosclerosis. No evidence of aneurysm or adenopathy. Reproductive: Uterus and adnexa unremarkable.  No mass. Other: Small amount of free fluid in the cul-de-sac, increased since prior study. Interstitial changes/stranding around the pancreatic head and 2/3 portions of the duodenum are stable since prior study and may be related to radiation changes. Musculoskeletal: Postoperative changes in the lumbar spine. Degenerative changes in the thoracolumbar spine. No acute bony abnormality. IMPRESSION: Dilated small bowel diffusely without significant caliber change in the distal small bowel. This could reflect ileus although the colon is not significantly dilated. Distal small bowel obstruction difficult to completely exclude. Small amount of free fluid in the pelvis. Trace bilateral pleural effusions. Stable pancreatic atrophy and ductal dilatation. Stable stranding/interstitial changes around the pancreatic head and adjacent duodenum. Aortic atherosclerosis. Electronically Signed   By: Charlett Nose M.D.   On: 05/06/2022 00:11    Procedures Procedures    Medications Ordered in ED Medications  lactated ringers infusion ( Intravenous New Bag/Given 05/06/22 0302)  sodium chloride 0.9 % bolus 500 mL (0 mLs Intravenous Stopped 05/05/22 2332)  ondansetron (ZOFRAN) injection 4 mg (4 mg Intravenous  Given 05/05/22 2256)  fentaNYL (SUBLIMAZE) injection 50 mcg (50 mcg Intravenous Given 05/05/22 2337)  simethicone (MYLICON) 40 MG/0.6ML suspension 40 mg (40 mg Oral Given 05/06/22 0041)  iohexol (OMNIPAQUE) 300 MG/ML solution 100 mL (100 mLs Intravenous Contrast Given 05/05/22 2344)  metoCLOPramide (REGLAN) injection 10 mg (10 mg Intravenous Given 05/06/22 0228)  dicyclomine (BENTYL) capsule 10 mg (10 mg Oral Given 05/06/22 0227)    ED Course/ Medical Decision Making/ A&P  Medical Decision Making Amount and/or Complexity of Data Reviewed Labs: ordered. Radiology: ordered.  Risk OTC drugs. Prescription drug management. Decision regarding hospitalization.  Eval for ileus vs obstruction. Symptomatic treatment in the meantime.   CT A/P done and showed small bowel distension without obvious free air (independently viewed and interpreted by myself and radiology read reviewed). Radiology considers ileus vs obstruction. Persistent nausea and pain, will treat and d/w medicine about obs for symptomatic treatment and resolution of symptoms.    Final Clinical Impression(s) / ED Diagnoses Final diagnoses:  Nausea and vomiting, unspecified vomiting type    Rx / DC Orders ED Discharge Orders     None         Meg Niemeier, Barbara Cower, MD 05/06/22 [phone]

## 2022-05-06 NOTE — Assessment & Plan Note (Signed)
Continue PPI ?

## 2022-05-06 NOTE — Consult Note (Signed)
[facility] Consult  Reason for Consult: ? pSBO versus ileus  Referring Physician: Dr. Renford Dills   Chief Complaint   Emesis     HPI: Sheryl Bender is a 77 y.o. female with a history of pancreatic cancer s/p neoadjuvant chemotherapy, Ex lap and cholecystectomy with aborted whipple as her cancer was not operable due to scar tissue she reports. She had this surgery in January with Dr. Freida Busman at [facility]. She has been seen by [name] and started on Xeloda. She has been having diarrhea and been taking imodium to control the diarrhea.   She then started to have abdominal pain, nausea and vomiting. She came to the ED. She reports now starting to have diarrhea again.   I reviewed patient's chart earlier today and her imaging and did not feel like she had a SBO but more likely ileus or dysmotility induced by the imodium. She is on Xeloda and this causes diarrhea. Recommended starting diet, and fiber to help control the diarrhea instead of imodium.   Past Medical History:  Diagnosis Date   Anxiety    Arthritis    Depression    Dysrhythmia    hx palpitations greater than 5 yrs -neg echo, stress per pt ? where or dr   GERD (gastroesophageal reflux disease)    occ   Nausea & vomiting 08/24/2021   Pancreatic adenocarcinoma    Pancreatic pseudocyst    Port-A-Cath in place 09/15/2021   Pulmonary embolism    September 2022    Past Surgical History:  Procedure Laterality Date   ANTERIOR LAT LUMBAR FUSION Right 12/16/2015   Procedure: RIGHT LUMBAR TWO-THREE, LUMBAR THREE-FOUR, LUMBAR FOUR-FIVE ANTEROLATERAL LUMBAR INTERBODY FUSION;  Surgeon: Maeola Harman, MD;  Location: [facility] OR;  Service: Neurosurgery;  Laterality: Right;   BALLOON DILATION N/A 08/27/2021   Procedure: BALLOON DILATION;  Surgeon: Meridee Score Netty Starring., MD;  Location: [facility];  Service: Gastroenterology;  Laterality: N/A;   BIOPSY  07/09/2021   Procedure: BIOPSY;  Surgeon: Meridee Score Netty Starring., MD;   Location: [facility] ENDOSCOPY;  Service: Gastroenterology;;   CYST GASTROSTOMY  08/27/2021   Procedure: CYST GASTROSTOMY;  Surgeon: Lemar Lofty., MD;  Location: [facility] ENDOSCOPY;  Service: Gastroenterology;;   ESOPHAGOGASTRODUODENOSCOPY N/A 07/09/2021   Procedure: ESOPHAGOGASTRODUODENOSCOPY (EGD);  Surgeon: Lemar Lofty., MD;  Location: [facility] ENDOSCOPY;  Service: Gastroenterology;  Laterality: N/A;   ESOPHAGOGASTRODUODENOSCOPY (EGD) WITH PROPOFOL N/A 08/27/2021   Procedure: ESOPHAGOGASTRODUODENOSCOPY (EGD) WITH PROPOFOL;  Surgeon: Meridee Score Netty Starring., MD;  Location: [facility] ENDOSCOPY;  Service: Gastroenterology;  Laterality: N/A;   ESOPHAGOGASTRODUODENOSCOPY (EGD) WITH PROPOFOL N/A 10/08/2021   Procedure: ESOPHAGOGASTRODUODENOSCOPY (EGD) WITH PROPOFOL;  Surgeon: Meridee Score Netty Starring., MD;  Location: [facility] ENDOSCOPY;  Service: Gastroenterology;  Laterality: N/A;   EUS N/A 07/09/2021   Procedure: UPPER ENDOSCOPIC ULTRASOUND (EUS) RADIAL;  Surgeon: Lemar Lofty., MD;  Location: [facility] ENDOSCOPY;  Service: Gastroenterology;  Laterality: N/A;   EUS N/A 08/27/2021   Procedure: UPPER ENDOSCOPIC ULTRASOUND (EUS) LINEAR;  Surgeon: Lemar Lofty., MD;  Location: [facility] ENDOSCOPY;  Service: Gastroenterology;  Laterality: N/A;   FINE NEEDLE ASPIRATION  07/09/2021   Procedure: FINE NEEDLE ASPIRATION (FNA) LINEAR;  Surgeon: Lemar Lofty., MD;  Location: [facility] ENDOSCOPY;  Service: Gastroenterology;;   IR IMAGING GUIDED PORT INSERTION  09/14/2021   LAPAROSCOPY N/A 02/15/2022   Procedure: STAGING DIAGNOSTIC;  Surgeon: Fritzi Mandes, MD;  Location: [facility] OR;  Service: General;  Laterality: N/A;   LUMBAR PERCUTANEOUS PEDICLE SCREW 3 LEVEL Bilateral 12/16/2015   Procedure:  PERCUTANEOUS PEDICLE SCREWS BILATERALLY AT LUMBAR TWO-FIVE;  Surgeon: Maeola HarmanJoseph Stern, MD;  [facility];  Service: Neurosurgery;  Laterality: Bilateral;   PANCREATIC STENT PLACEMENT  08/27/2021   Procedure: PANCREATIC STENT PLACEMENT;   Surgeon: Meridee ScoreMansouraty, Netty StarringGabriel Jr., MD;  [facility];  Service: Gastroenterology;;   Francine GravenSTENT REMOVAL  10/08/2021   Procedure: STENT REMOVAL;  Surgeon: Lemar LoftyMansouraty, Gabriel Jr., MD;  [facility];  Service: Gastroenterology;;   TUBAL LIGATION  1974   WHIPPLE PROCEDURE N/A 02/15/2022   Procedure: ATTEMPTED WHIPPLE PROCEDURE;  Surgeon: Fritzi MandesAllen, Shelby L, MD;  Location: [facility] OR;  Service: General;  Laterality: N/A;    Family History  Problem Relation Age of Onset   Pancreatitis Brother        alcoholic pancreatitis   Pancreatic cancer Neg Hx    Colon cancer Neg Hx     Social History   Tobacco Use   Smoking status: Never   Smokeless tobacco: Never  Vaping Use   Vaping Use: Never used  Substance Use Topics   Alcohol use: No   Drug use: Never    Medications: I have reviewed the patient's current medications. Prior to Admission:  Medications Prior to Admission  Medication Sig Dispense Refill Last Dose   acetaminophen (TYLENOL) 500 MG tablet Take 2 tablets (1,000 mg total) by mouth every 8 (eight) hours as needed for moderate pain, headache or fever. (Patient taking differently: Take 750-1,000 mg by mouth every 8 (eight) hours as needed for moderate pain, headache or fever.)   unk   Aromatic Inhalants (VICKS VAPOINHALER) INHA Inhale 1 puff into the lungs daily as needed (congestion).   unk   betamethasone dipropionate 0.05 % cream Apply topically 2 (two) times daily. 30 g 3 05/05/2022   buPROPion (WELLBUTRIN XL) 300 MG 24 hr tablet Take 300 mg by mouth daily after breakfast.   05/05/2022   busPIRone (BUSPAR) 10 MG tablet Take 20 mg by mouth 3 (three) times daily.   05/05/2022   calcium carbonate (TUMS - DOSED IN MG ELEMENTAL CALCIUM) 500 MG chewable tablet Chew 1 tablet (200 mg of elemental calcium total) by mouth 2 (two) times daily as needed for indigestion or heartburn. (Patient taking differently: Chew 1-2 tablets by mouth 2 (two) times daily as needed for indigestion or  heartburn.)   unk   Cholecalciferol (VITAMIN D3) 125 MCG (5000 UT) CAPS Take 5,000 Units by mouth every other day.   05/05/2022   escitalopram (LEXAPRO) 20 MG tablet Take 20 mg by mouth at bedtime.   05/05/2022   lactose free nutrition (BOOST) LIQD Take 237 mLs by mouth 2 (two) times daily between meals.   05/05/2022   linaclotide (LINZESS) 72 MCG capsule Take 1 capsule (72 mcg total) by mouth daily before breakfast. 30 capsule 12 05/05/2022   methocarbamol (ROBAXIN) 500 MG tablet Take 1 tablet (500 mg total) by mouth every 8 (eight) hours as needed for muscle spasms. 15 tablet 0 unk   metoprolol succinate (TOPROL-XL) 25 MG 24 hr tablet Take 25 mg by mouth daily after breakfast.   05/05/2022 at am   mirtazapine (REMERON) 30 MG tablet Take 30 mg by mouth at bedtime.   Past Week   omeprazole (PRILOSEC) 40 MG capsule Take 1 capsule (40 mg total) by mouth daily. 30 capsule 12 05/05/2022   ondansetron (ZOFRAN-ODT) 8 MG disintegrating tablet Take 1 tablet (8 mg total) by mouth every 8 (eight) hours as needed for nausea or vomiting. 30 tablet 0 Past Week  Polyethyl Glycol-Propyl Glycol (SYSTANE OP) Place 1 drop into both eyes at bedtime.   Past Week   potassium chloride SA (KLOR-CON M) 20 MEQ tablet One po bid x 3 days, then one po once a day (Patient taking differently: Take 20 mEq by mouth daily. One po bid x 3 days, then one po once a day) 20 tablet 0 05/05/2022   QUEtiapine (SEROQUEL) 200 MG tablet Take 200 mg by mouth at bedtime.   Past Week   QUEtiapine (SEROQUEL) 25 MG tablet Take 25 mg by mouth 3 (three) times daily.   05/05/2022   vitamin B-12 (CYANOCOBALAMIN) 500 MCG tablet Take 1 tablet (500 mcg total) by mouth daily. 30 tablet 2 05/05/2022   capecitabine (XELODA) 500 MG tablet Take 3 tablets (1,500 mg total) by mouth 2 (two) times daily after a meal. Take Monday- Friday. Take only on days of radiation. (Patient not taking: Reported on 05/06/2022) 180 tablet 0 Not Taking   fluorouracil CALGB [address] 1,920  mg/m2 in sodium chloride 0.9 % 150 mL Inject 1,920 mg/m2 into the vein over 48 hr.      gabapentin (NEURONTIN) 100 MG capsule Take 1 capsule (100 mg total) by mouth 2 (two) times daily. (Patient not taking: Reported on 04/28/2022) 60 capsule 0 Not Taking   lactulose (CHRONULAC) 10 GM/15ML solution Take 15 mLs (10 g total) by mouth at bedtime. Take 15 ml at bedtime every night to assist with bowel movements. If bowel movement has not occurred in 3-4 days take 15 ml every 3 hours until bowel movement has occurred. (Patient not taking: Reported on 04/28/2022) 236 mL 0 Not Taking   lidocaine-prilocaine (EMLA) cream Apply a small amount to port a cath site (do not rub in) and cover with plastic wrap 1 hour prior to infusion appointments 30 g 3    Scheduled:  buPROPion  300 mg Oral QPC breakfast   busPIRone  20 mg Oral BID   escitalopram  20 mg Oral QHS   fiber  1 packet Oral BID   heparin  5,000 Units Subcutaneous Q8H   mirtazapine  30 mg Oral QHS   pantoprazole (PROTONIX) IV  40 mg Intravenous Q24H   QUEtiapine  25 mg Oral TID PC   simethicone  80 mg Oral QID   Continuous:  lactated ringers Stopped (05/06/22 0452)   UEA:[id number] **OR** acetaminophen, methocarbamol, morphine injection, ondansetron **OR** ondansetron (ZOFRAN) IV, oxyCODONE  Allergies  Allergen Reactions   Other Hives and Other (See Comments)    Cherry wood just cut- "smelled it and broke out"; cannot tolerate ANY cherry fragrances, either      Cherry Hives   Wound Dressing Adhesive Rash and Other (See Comments)    Band-Aids = local reaction     ROS:  A comprehensive review of systems was negative except for: Gastrointestinal: positive for abdominal pain, diarrhea, nausea, and distention  Blood pressure 111/64, pulse 83, temperature 97.8 F (36.6 C), temperature source Oral, resp. rate 16, weight 56.9 kg, SpO2 97 %. Physical Exam Vitals reviewed.  HENT:     Head: Normocephalic.     Nose: Nose normal.  Eyes:      Extraocular Movements: Extraocular movements intact.  Cardiovascular:     Rate and Rhythm: Normal rate.  Pulmonary:     Effort: Pulmonary effort is normal.  Abdominal:     General: There is distension.     Palpations: Abdomen is soft.     Tenderness: There is abdominal tenderness.  Comments: Mild tenderness   Musculoskeletal:        General: No swelling.  Skin:    General: Skin is warm.  Neurological:     General: No focal deficit present.     Mental Status: She is alert.  Psychiatric:        Mood and Affect: Mood normal.     Results: Results for orders placed or performed during the hospital encounter of 05/05/22 (from the past 48 hour(s))  Lipase, blood     Status: None   Collection Time: 05/05/22 10:58 PM  Result Value Ref Range   Lipase 17 11 - 51 U/L    Comment: Performed at [facility], [address], [place], [place] [address]  Comprehensive metabolic panel     Status: Abnormal   Collection Time: 05/05/22 10:58 PM  Result Value Ref Range   Sodium 134 (L) 135 - 145 mmol/L   Potassium 3.6 3.5 - 5.1 mmol/L   Chloride 101 98 - 111 mmol/L   CO2 26 22 - 32 mmol/L   Glucose, Bld 117 (H) 70 - 99 mg/dL    Comment: Glucose reference range applies only to samples taken after fasting for at least 8 hours.   BUN 13 8 - 23 mg/dL   Creatinine, Ser 6.21 0.44 - 1.00 mg/dL   Calcium 8.1 (L) 8.9 - 10.3 mg/dL   Total Protein 5.3 (L) 6.5 - 8.1 g/dL   Albumin 2.6 (L) 3.5 - 5.0 g/dL   AST 22 15 - 41 U/L   ALT 21 0 - 44 U/L   Alkaline Phosphatase 67 38 - 126 U/L   Total Bilirubin 0.2 (L) 0.3 - 1.2 mg/dL   GFR, Estimated >30 >86 mL/min    Comment: (NOTE) Calculated using the CKD-EPI Creatinine Equation (2021)    Anion gap 7 5 - 15    Comment: Performed at [facility], [address], [place], [place] [address]  CBC     Status: Abnormal   Collection Time: 05/05/22 10:58 PM  Result Value Ref Range   WBC 4.9 4.0 - 10.5 K/uL   RBC 3.26 (L) 3.87 - 5.11 MIL/uL   Hemoglobin 10.4  (L) 12.0 - 15.0 g/dL   HCT 96.2 (L) 95.2 - 84.1 %   MCV 97.5 80.0 - 100.0 fL   MCH 31.9 26.0 - 34.0 pg   MCHC 32.7 30.0 - 36.0 g/dL   RDW 32.4 (H) 40.1 - 02.7 %   Platelets 181 150 - 400 K/uL   nRBC 0.0 0.0 - 0.2 %    Comment: Performed at [facility], [address] [place], [place] [address]  Lactic acid, plasma     Status: None   Collection Time: 05/05/22 11:41 PM  Result Value Ref Range   Lactic Acid, Venous 1.1 0.5 - 1.9 mmol/L    Comment: Performed at [facility], [address] [place], [place] [address]  Urinalysis, Routine w reflex microscopic -Urine, Clean Catch     Status: Abnormal   Collection Time: 05/06/22  2:04 AM  Result Value Ref Range   Color, Urine YELLOW YELLOW   APPearance HAZY (A) CLEAR   Specific Gravity, Urine >1.046 (H) 1.005 - 1.030   pH 5.0 5.0 - 8.0   Glucose, UA NEGATIVE NEGATIVE mg/dL   Hgb urine dipstick NEGATIVE NEGATIVE   Bilirubin Urine NEGATIVE NEGATIVE   Ketones, ur NEGATIVE NEGATIVE mg/dL   Protein, ur NEGATIVE NEGATIVE mg/dL   Nitrite NEGATIVE NEGATIVE   Leukocytes,Ua SMALL (A)  NEGATIVE   RBC / HPF 6-10 0 - 5 RBC/hpf   WBC, UA 0-5 0 - 5 WBC/hpf   Bacteria, UA RARE (A) NONE SEEN   Squamous Epithelial / HPF 21-50 0 - 5 /HPF   Renal Epithelial 2     Comment: Performed at [facility], [address], [place], [place] [address]  Comprehensive metabolic panel     Status: Abnormal   Collection Time: 05/06/22  5:28 AM  Result Value Ref Range   Sodium 133 (L) 135 - 145 mmol/L   Potassium 3.9 3.5 - 5.1 mmol/L   Chloride 102 98 - 111 mmol/L   CO2 27 22 - 32 mmol/L   Glucose, Bld 102 (H) 70 - 99 mg/dL    Comment: Glucose reference range applies only to samples taken after fasting for at least 8 hours.   BUN 12 8 - 23 mg/dL   Creatinine, Ser 6.04 0.44 - 1.00 mg/dL   Calcium 7.9 (L) 8.9 - 10.3 mg/dL   Total Protein 4.7 (L) 6.5 - 8.1 g/dL   Albumin 2.3 (L) 3.5 - 5.0 g/dL   AST 17 15 - 41 U/L   ALT 19 0 - 44 U/L   Alkaline Phosphatase 63 38 -  126 U/L   Total Bilirubin 0.5 0.3 - 1.2 mg/dL   GFR, Estimated >54 >09 mL/min    Comment: (NOTE) Calculated using the CKD-EPI Creatinine Equation (2021)    Anion gap 4 (L) 5 - 15    Comment: Performed at [facility], [address] [place], [place] [address]  Magnesium     Status: None   Collection Time: 05/06/22  5:28 AM  Result Value Ref Range   Magnesium 2.0 1.7 - 2.4 mg/dL    Comment: Performed at [facility], [address] [place], [place] [address]  CBC with Differential/Platelet     Status: Abnormal   Collection Time: 05/06/22  5:28 AM  Result Value Ref Range   WBC 4.1 4.0 - 10.5 K/uL   RBC 3.04 (L) 3.87 - 5.11 MIL/uL   Hemoglobin 9.8 (L) 12.0 - 15.0 g/dL   HCT 56.2 (L) 13.0 - 86.5 %   MCV 98.7 80.0 - 100.0 fL   MCH 32.2 26.0 - 34.0 pg   MCHC 32.7 30.0 - 36.0 g/dL   RDW 78.4 (H) 69.6 - 29.5 %   Platelets 172 150 - 400 K/uL   nRBC 0.0 0.0 - 0.2 %   Neutrophils Relative % 66 %   Neutro Abs 2.7 1.7 - 7.7 K/uL   Lymphocytes Relative 7 %   Lymphs Abs 0.3 (L) 0.7 - 4.0 K/uL   Monocytes Relative 23 %   Monocytes Absolute 0.9 0.1 - 1.0 K/uL   Eosinophils Relative 1 %   Eosinophils Absolute 0.1 0.0 - 0.5 K/uL   Basophils Relative 1 %   Basophils Absolute 0.0 0.0 - 0.1 K/uL   Immature Granulocytes 2 %   Abs Immature Granulocytes 0.06 0.00 - 0.07 K/uL    Comment: Performed at [facility], [address] [place], [place] [address]  Urinalysis, Routine w reflex microscopic -Urine, Clean Catch     Status: None   Collection Time: 05/06/22 12:25 PM  Result Value Ref Range   Color, Urine YELLOW YELLOW   APPearance CLEAR CLEAR   Specific Gravity, Urine 1.017 1.005 - 1.030   pH 5.0 5.0 - 8.0   Glucose, UA NEGATIVE NEGATIVE mg/dL   Hgb urine dipstick NEGATIVE NEGATIVE   Bilirubin Urine  NEGATIVE NEGATIVE   Ketones, ur NEGATIVE NEGATIVE mg/dL   Protein, ur NEGATIVE NEGATIVE mg/dL   Nitrite NEGATIVE NEGATIVE   Leukocytes,Ua NEGATIVE NEGATIVE    Comment: Performed at [facility], [address] [place], [place] [address]   Personally reviewed- some dilated small bowel, no obvious transition DG Abd 1 View  Result Date: 05/06/2022 CLINICAL DATA:  Nausea and vomiting EXAM: ABDOMEN - 1 VIEW COMPARISON:  Previous studies including the CT done on May 07, 2022 FINDINGS: There is dilation of small bowel loops measuring up to 3.3 cm in diameter. There is prominence of mucosal folds in dilated small bowel loops. Gas and stool are present in the colon. Stomach is not distended. Degenerative changes and postsurgical changes are noted in the lumbar spine. There is contrast in the urinary bladder from previous CT. IMPRESSION: Lays mild dilation of small bowel loops suggesting ileus or partial obstruction. Electronically Signed   By: Ernie Avena M.D.   On: 05/06/2022 08:18   CT ABDOMEN PELVIS W CONTRAST  Result Date: 05/06/2022 CLINICAL DATA:  Abdominal pain, acute, nonlocalized. Vomiting. History of pancreatic cancer. EXAM: CT ABDOMEN AND PELVIS WITH CONTRAST TECHNIQUE: Multidetector CT imaging of the abdomen and pelvis was performed using the standard protocol following bolus administration of intravenous contrast. RADIATION DOSE REDUCTION: This exam was performed according to the departmental dose-optimization program which includes automated exposure control, adjustment of the mA and/or kV according to patient size and/or use of iterative reconstruction technique. CONTRAST:  OMNIPAQUE IOHEXOL 300 MG/ML  SOLN COMPARISON:  04/28/2022 FINDINGS: Lower chest: Trace bilateral pleural effusion. Left base atelectasis. Hepatobiliary: Prior cholecystectomy. No biliary ductal dilatation or suspicious focal hepatic abnormality. Pancreas: Severe atrophy of the pancreatic body and tail with ductal dilatation. Appearance is stable since prior study. Spleen: Two separate spleens again noted. No focal abnormality. No change. Adrenals/Urinary Tract: Bilateral renal parapelvic cysts, stable.  No hydronephrosis. Adrenal glands and urinary bladder unremarkable. Stomach/Bowel: Dilated small bowel loops into the pelvis. Small bowel is fairly dilated to the ileocecal valve without significant caliber change. Moderate stool burden throughout the colon which is normal caliber. Stomach decompressed. Vascular/Lymphatic: Aortic atherosclerosis. No evidence of aneurysm or adenopathy. Reproductive: Uterus and adnexa unremarkable.  No mass. Other: Small amount of free fluid in the cul-de-sac, increased since prior study. Interstitial changes/stranding around the pancreatic head and 2/3 portions of the duodenum are stable since prior study and may be related to radiation changes. Musculoskeletal: Postoperative changes in the lumbar spine. Degenerative changes in the thoracolumbar spine. No acute bony abnormality. IMPRESSION: Dilated small bowel diffusely without significant caliber change in the distal small bowel. This could reflect ileus although the colon is not significantly dilated. Distal small bowel obstruction difficult to completely exclude. Small amount of free fluid in the pelvis. Trace bilateral pleural effusions. Stable pancreatic atrophy and ductal dilatation. Stable stranding/interstitial changes around the pancreatic head and adjacent duodenum. Aortic atherosclerosis. Electronically Signed   By: Charlett Nose M.D.   On: 05/06/2022 00:11     Assessment & Plan:  Sheryl Bender is a 77 y.o. female with what is more likely ileus/ dysmotility from the imodium/ Xeloda combination.  She has known pancreatic adenocarcinoma that is non operative and she is on Xeloda and radiation. She is on a full liquid diet now.  -Ordered fiber  supplement BID -Ordered GasX QID to see if that helps with her distention  -No acute surgical intervention needed    All questions were answered to the satisfaction of the  patient.    Lucretia Roers 05/06/2022, 6:20 PM

## 2022-05-06 NOTE — Progress Notes (Signed)
Brief same day  note:  Patient is a 77 year old female with history of anxiety, depression, GERD, pancreatic adenocarcinoma, PE who presents with complaint of vomiting.  Recent history of diarrhea and was taking Imodium which subsequently caused constipation and then she started nauseated and vomiting ,patient also reported abdominal pain.  Abdominal imaging showed possible ileus versus partial SBO.  General surgery also consulted today.  Patient seen and examined at bedside this morning.  She feels better today.  Her abdominal distention has improved.  She has good bowel sounds and she passed gas.  She did not complain of severe abdominal pain but he still has some discomfort.  KUB done this morning features of ileus/partial SBO.  Assessment and plan:  Intractable nausea/vomiting/ileitis/partial SBO: Started on IV fluids, antiemetics.  Xeloda will hold.  CT abdomen showed possible ileus versus distal small bowel obstruction. KUB this morning showed features of ileus, could not rule out partial small bowel obstruction.  General surgery already following. Abdomen appears benign today, mild distended but has good bowel sounds.  Given a dose of Dulcolax suppository.  Started on clear liquid diet.  GERD: Continue PPI  History of pancreatic adenocarcinoma: Follows with oncology. she had a ex lap and cholecystectomy and they aborted the whipple as they could not get clear margins . CT abdomen/pelvis showed stable pancreatic atrophy and ductal dilatation. Stable stranding/interstitial changes around the pancreatic head and adjacent duodenum.  Lipase level normal.  Depression: Continue Wellbutrin, Lexapro, BuSpar, Seroquel

## 2022-05-06 NOTE — Assessment & Plan Note (Signed)
-   Continue Zofran as needed for nausea and vomiting - Holding Xeloda - CT abdomen showed possible ileus versus distal small bowel obstruction - Monitor any changes with KUB in a.m. - N.p.o. except for ice chips and sips with meds - Consults oncology, this consult has not been called, for intractable nausea and vomiting on chemo - Continue to monitor

## 2022-05-06 NOTE — H&P (Addendum)
History and Physical    [name] TMH:[id number] DOB: 01/21/1946 DOA: 05/05/2022 DOS: the patient was seen and examined on 05/06/2022 PCP: [name], DO  Patient coming from: Home  Chief Complaint:  Chief Complaint  Patient presents with   Emesis   HPI: Sheryl Bender is a 77 y.o. female with medical history significant of anxiety, depression, GERD, pancreatic adenocarcinoma, PE, and more presents the ED with a chief complaint of projectile vomiting.  Patient reports that her GI system has been "out of whack."  She reports 1 week ago she was having diarrhea.  She took Imodium.  She was taking it once or twice a day.  When the diarrhea stopped she no acute of bowel movements.  She then started having nausea and vomiting.  She reports she can eat, because anything would come right back up.  She called her oncologist who told her she can eat solid foods and to drink 5 boost per day.  On the day of presentation she was working her second boost when it came right back up, and she decided she could not stay at home.  Patient reports she has not had a bowel movement since Monday.  Normal for her is every other day.  Since being on Xeloda she has been having a bowel movement every day.  She is on fluid and radiation for about 5 weeks.  Patient denies any hematochezia or melena.  She does have associated abdominal pain that feels like a pressure and an ache.  She has had worsening acid reflux since being on Xeloda as well.  Her PPI helps with the reflux.  Patient reports that the pain in her abdomen moves around.  On the day of presentation she felt her abdomen was so tight That she might have a baby.  Now it is just sore, but the tightness in the distention has improved per her report.  She denies any fevers.  She reports a change in her urine output, with feeling like she has the urge to go but cannot get a few drops out.  She does understand that this is likely due to dehydration since she  has not been able to eat or drink.  Patient reports a decrease in flatulence as well.  She has no other complaints at this time.  Patient does not smoke, does not drink, does not use illicit drugs.  She is not vaccinated for COVID or flu this year.  Patient is full code. Review of Systems: As mentioned in the history of present illness. All other systems reviewed and are negative. Past Medical History:  Diagnosis Date   Anxiety    Arthritis    Depression    Dysrhythmia    hx palpitations greater than 5 yrs -neg echo, stress per pt ? where or dr   GERD (gastroesophageal reflux disease)    occ   Nausea & vomiting 08/24/2021   Pancreatic adenocarcinoma    Pancreatic pseudocyst    Port-A-Cath in place 09/15/2021   Pulmonary embolism    September 2022   Past Surgical History:  Procedure Laterality Date   ANTERIOR LAT LUMBAR FUSION Right 12/16/2015   Procedure: RIGHT LUMBAR TWO-THREE, LUMBAR THREE-FOUR, LUMBAR FOUR-FIVE ANTEROLATERAL LUMBAR INTERBODY FUSION;  Surgeon: Maeola Harman, MD;  Location: [facility];  Service: Neurosurgery;  Laterality: Right;   BALLOON DILATION N/A 08/27/2021   Procedure: BALLOON DILATION;  Surgeon: Meridee Score Netty Starring., MD;  Location: [facility];  Service: Gastroenterology;  Laterality: N/A;   BIOPSY  07/09/2021   Procedure: BIOPSY;  Surgeon: Lemar LoftyMansouraty, Gabriel Jr., MD;  [facility];  Service: Gastroenterology;;   CYST GASTROSTOMY  08/27/2021   Procedure: CYST GASTROSTOMY;  Surgeon: Lemar LoftyMansouraty, Gabriel Jr., MD;  [facility];  Service: Gastroenterology;;   ESOPHAGOGASTRODUODENOSCOPY N/A 07/09/2021   Procedure: ESOPHAGOGASTRODUODENOSCOPY (EGD);  Surgeon: Lemar LoftyMansouraty, Gabriel Jr., MD;  [facility];  Service: Gastroenterology;  Laterality: N/A;   ESOPHAGOGASTRODUODENOSCOPY (EGD) WITH PROPOFOL N/A 08/27/2021   Procedure: ESOPHAGOGASTRODUODENOSCOPY (EGD) WITH PROPOFOL;  Surgeon: Meridee ScoreMansouraty, Netty StarringGabriel Jr., MD;  Location: [facility];  Service:  Gastroenterology;  Laterality: N/A;   ESOPHAGOGASTRODUODENOSCOPY (EGD) WITH PROPOFOL N/A 10/08/2021   Procedure: ESOPHAGOGASTRODUODENOSCOPY (EGD) WITH PROPOFOL;  Surgeon: Meridee ScoreMansouraty, Netty StarringGabriel Jr., MD;  Location: [facility];  Service: Gastroenterology;  Laterality: N/A;   EUS N/A 07/09/2021   Procedure: UPPER ENDOSCOPIC ULTRASOUND (EUS) RADIAL;  Surgeon: Lemar LoftyMansouraty, Gabriel Jr., MD;  [facility];  Service: Gastroenterology;  Laterality: N/A;   EUS N/A 08/27/2021   Procedure: UPPER ENDOSCOPIC ULTRASOUND (EUS) LINEAR;  Surgeon: Lemar LoftyMansouraty, Gabriel Jr., MD;  Location: [facility];  Service: Gastroenterology;  Laterality: N/A;   FINE NEEDLE ASPIRATION  07/09/2021   Procedure: FINE NEEDLE ASPIRATION (FNA) LINEAR;  Surgeon: Lemar LoftyMansouraty, Gabriel Jr., MD;  [facility];  Service: Gastroenterology;;   IR IMAGING GUIDED PORT INSERTION  09/14/2021   LAPAROSCOPY N/A 02/15/2022   Procedure: STAGING DIAGNOSTIC;  Surgeon: Fritzi MandesAllen, Shelby L, MD;  [facility];  Service: General;  Laterality: N/A;   LUMBAR PERCUTANEOUS PEDICLE SCREW 3 LEVEL Bilateral 12/16/2015   Procedure: PERCUTANEOUS PEDICLE SCREWS BILATERALLY AT LUMBAR TWO-FIVE;  Surgeon: Maeola HarmanJoseph Stern, MD;  [facility];  Service: Neurosurgery;  Laterality: Bilateral;   PANCREATIC STENT PLACEMENT  08/27/2021   Procedure: PANCREATIC STENT PLACEMENT;  Surgeon: Lemar LoftyMansouraty, Gabriel Jr., MD;  [facility];  Service: Gastroenterology;;   Francine GravenSTENT REMOVAL  10/08/2021   Procedure: STENT REMOVAL;  Surgeon: Lemar LoftyMansouraty, Gabriel Jr., MD;  [facility];  Service: Gastroenterology;;   TUBAL LIGATION  1974   WHIPPLE PROCEDURE N/A 02/15/2022   Procedure: ATTEMPTED WHIPPLE PROCEDURE;  Surgeon: Fritzi MandesAllen, Shelby L, MD;  [facility];  Service: General;  Laterality: N/A;   Social History:  reports that she has never smoked. She has never used smokeless tobacco. She reports that she does not drink alcohol and does not use drugs.  Allergies   Allergen Reactions   Other Hives and Other (See Comments)    Cherry wood just cut- "smelled it and broke out"; cannot tolerate ANY cherry fragrances, either      Cherry Hives   Wound Dressing Adhesive Rash and Other (See Comments)    Band-Aids = local reaction    Family History  Problem Relation Age of Onset   Pancreatitis Brother        alcoholic pancreatitis   Pancreatic cancer Neg Hx    Colon cancer Neg Hx     Prior to Admission medications   Medication Sig Start Date End Date Taking? Authorizing Provider  acetaminophen (TYLENOL) 500 MG tablet Take 2 tablets (1,000 mg total) by mouth every 8 (eight) hours as needed for moderate pain, headache or fever. Patient taking differently: Take 750-1,000 mg by mouth every 8 (eight) hours as needed for moderate pain, headache or fever. 07/21/21   Vassie LollMadera, Carlos, MD  Aromatic Inhalants (VICKS VAPOINHALER) INHA Inhale 1 puff into the lungs daily as needed (congestion).    [provider]  betamethasone dipropionate 0.05 % cream Apply topically 2 (two) times daily. 04/20/22  Doreatha Massed, MD  buPROPion (WELLBUTRIN XL) 300 MG 24 hr tablet Take 300 mg by mouth daily after breakfast. 06/15/21   [provider]  busPIRone (BUSPAR) 10 MG tablet Take 20 mg by mouth 2 (two) times daily. 01/27/21   [provider]  calcium carbonate (TUMS - DOSED IN MG ELEMENTAL CALCIUM) 500 MG chewable tablet Chew 1 tablet (200 mg of elemental calcium total) by mouth 2 (two) times daily as needed for indigestion or heartburn. Patient taking differently: Chew 1-2 tablets by mouth 2 (two) times daily as needed for indigestion or heartburn. 08/31/21   Lewie Chamber, MD  capecitabine (XELODA) 500 MG tablet Take 3 tablets (1,500 mg total) by mouth 2 (two) times daily after a meal. Take Monday- Friday. Take only on days of radiation. 03/03/22   Doreatha Massed, MD  Cholecalciferol (VITAMIN D3) 125 MCG (5000 UT) CAPS Take 5,000 Units by mouth  every other day.    [provider]  escitalopram (LEXAPRO) 20 MG tablet Take 20 mg by mouth at bedtime.    [provider]  fluorouracil CALGB 92446 1,920 mg/m2 in sodium chloride 0.9 % 150 mL Inject 1,920 mg/m2 into the vein over 48 hr.    [provider]  gabapentin (NEURONTIN) 100 MG capsule Take 1 capsule (100 mg total) by mouth 2 (two) times daily. Patient not taking: Reported on 04/28/2022 04/14/22   Doreatha Massed, MD  lactose free nutrition (BOOST) LIQD Take 237 mLs by mouth 2 (two) times daily between meals.    [provider]  lactulose (CHRONULAC) 10 GM/15ML solution Take 15 mLs (10 g total) by mouth at bedtime. Take 15 ml at bedtime every night to assist with bowel movements. If bowel movement has not occurred in 3-4 days take 15 ml every 3 hours until bowel movement has occurred. Patient not taking: Reported on 04/28/2022 10/02/21   Carnella Guadalajara, PA-C  lidocaine-prilocaine (EMLA) cream Apply a small amount to port a cath site (do not rub in) and cover with plastic wrap 1 hour prior to infusion appointments 09/15/21   Doreatha Massed, MD  linaclotide Karlene Einstein) 72 MCG capsule Take 1 capsule (72 mcg total) by mouth daily before breakfast. 10/08/21   Mansouraty, Netty Starring., MD  methocarbamol (ROBAXIN) 500 MG tablet Take 1 tablet (500 mg total) by mouth every 8 (eight) hours as needed for muscle spasms. 02/19/22   Fritzi Mandes, MD  metoprolol succinate (TOPROL-XL) 25 MG 24 hr tablet Take 25 mg by mouth daily after breakfast.    [provider]  mirtazapine (REMERON) 30 MG tablet Take 30 mg by mouth at bedtime. 06/15/21   [provider]  omeprazole (PRILOSEC) 40 MG capsule Take 1 capsule (40 mg total) by mouth daily. 10/08/21   Mansouraty, Netty Starring., MD  ondansetron (ZOFRAN-ODT) 8 MG disintegrating tablet Take 1 tablet (8 mg total) by mouth every 8 (eight) hours as needed for nausea or vomiting. 07/21/21   Vassie Loll, MD   Polyethyl Glycol-Propyl Glycol (SYSTANE OP) Place 1 drop into both eyes at bedtime.    [provider]  potassium chloride SA (KLOR-CON M) 20 MEQ tablet One po bid x 3 days, then one po once a day 04/28/22   Cathren Laine, MD  QUEtiapine (SEROQUEL) 25 MG tablet Take 25 mg by mouth 3 (three) times daily after meals.    [provider]  vitamin B-12 (CYANOCOBALAMIN) 500 MCG tablet Take 1 tablet (500 mcg total) by mouth daily. 07/22/21  Vassie Loll, MD    Physical Exam: Vitals:   05/06/22 0130 05/06/22 0205 05/06/22 0230 05/06/22 0232  BP: (!) 106/56 (!) 101/54 113/61   Pulse: 82 (!) 104 85   Resp:  17 15   Temp:    99.1 F (37.3 C)  TempSrc:    Oral  SpO2: 93% 96% 96%   Weight:       1.  General: Patient lying supine in bed,  no acute distress   2. Psychiatric: Alert and oriented x 3, mood and behavior normal for situation, pleasant and cooperative with exam   3. Neurologic: Speech and language are normal, face is symmetric, moves all 4 extremities voluntarily, at baseline without acute deficits on limited exam   4. HEENMT:  Head is atraumatic, normocephalic, pupils reactive to light, neck is supple, trachea is midline, mucous membranes are moist   5. Respiratory : Lungs are clear to auscultation bilaterally without wheezing, rhonchi, rales, no cyanosis, no increase in work of breathing or accessory muscle use   6. Cardiovascular : Heart rate normal, rhythm is regular, no murmurs, rubs or gallops, no peripheral edema, peripheral pulses palpated   7. Gastrointestinal:  Abdomen is soft, slightly distended more towards the upper abdomen than the lower abdomen, moderately tender to palpation, bowel sounds hypoactive, no masses or organomegaly palpated   8. Skin:  Skin is warm, dry and intact without rashes, acute lesions, or ulcers on limited exam   9.Musculoskeletal:  No acute deformities or trauma, no asymmetry in tone, no peripheral edema, peripheral  pulses palpated, no tenderness to palpation in the extremities  Data Reviewed: In the ED Afebrile, heart rate 82-104, respiratory rate 15-19, blood pressure within normal limits, satting 93-98% No leukocytosis with white blood cell count of 4.9, hemoglobin 10.4, platelets 181 Chemistries unremarkable Lactic acid 1.1 UA is not necessarily indicative of UTI Lipase 17 CT abdomen pelvis shows ileus versus distal small bowel obstruction Patient was given Bentyl, fentanyl, Reglan, Zofran, simethicone, NS 500 mL bolus, LR infusion in the ED Admission requested for intractable nausea and vomiting  Assessment and Plan: * Intractable nausea and vomiting - Continue Zofran as needed for nausea and vomiting - Holding Xeloda - CT abdomen showed possible ileus versus distal small bowel obstruction - Monitor any changes with KUB in a.m. - N.p.o. except for ice chips and sips with meds - Consults oncology, this consult has not been called, for intractable nausea and vomiting on chemo - Continue to monitor  GERD (gastroesophageal reflux disease) - Continue PPI  Depression - Continue Wellbutrin, Lexapro - Continue BuSpar and Seroquel - Continue to monitor  Essential hypertension - Continue metoprolol      Advance Care Planning:   Code Status: Full Code  Consults: Oncology  Family Communication: No family at bedside  Severity of Illness: The appropriate patient status for this patient is OBSERVATION. Observation status is judged to be reasonable and necessary in order to provide the required intensity of service to ensure the patient's safety. The patient's presenting symptoms, physical exam findings, and initial radiographic and laboratory data in the context of their medical condition is felt to place them at decreased risk for further clinical deterioration. Furthermore, it is anticipated that the patient will be medically stable for discharge from the hospital within 2 midnights of  admission.   Author: Lilyan Gilford, DO 05/06/2022 4:43 AM  For on call review [email or web address].

## 2022-05-07 DIAGNOSIS — R197 Diarrhea, unspecified: Secondary | ICD-10-CM | POA: Diagnosis not present

## 2022-05-07 DIAGNOSIS — R112 Nausea with vomiting, unspecified: Secondary | ICD-10-CM | POA: Diagnosis not present

## 2022-05-07 LAB — BASIC METABOLIC PANEL
Anion gap: 4 — ABNORMAL LOW (ref 5–15)
BUN: 9 mg/dL (ref 8–23)
CO2: 28 mmol/L (ref 22–32)
Calcium: 7.9 mg/dL — ABNORMAL LOW (ref 8.9–10.3)
Chloride: 106 mmol/L (ref 98–111)
Creatinine, Ser: 0.66 mg/dL (ref 0.44–1.00)
GFR, Estimated: >60 mL/min (ref >60)
Glucose, Bld: 89 mg/dL (ref 70–99)
Potassium: 3.6 mmol/L (ref 3.5–5.1)
Sodium: 138 mmol/L (ref 135–145)

## 2022-05-07 MED ORDER — SODIUM CHLORIDE 0.9 % IV SOLN: INTRAVENOUS | Status: DC

## 2022-05-07 MED ORDER — CHLORHEXIDINE GLUCONATE CLOTH 2 % EX PADS
6.0000 | MEDICATED_PAD | Freq: Every day | CUTANEOUS | Status: DC
  Administered 2022-05-07 – 2022-05-09 (×3): 6 via TOPICAL

## 2022-05-07 NOTE — Progress Notes (Signed)
PROGRESS NOTE  Sheryl Bender  KZL:[id number] DOB: May 06, 1945 DOA: 05/05/2022 PCP: [name], DO   Brief Narrative: Patient is a 77 year old female with history of anxiety, depression, GERD, pancreatic adenocarcinoma, PE who presents with complaint of vomiting.  Recent history of diarrhea and was taking Imodium which subsequently caused constipation and then she started nauseated and vomiting ,patient also reported abdominal pain.  Abdominal imaging showed possible ileus versus partial SBO.  General surgery also consulted. She started having large liquid bowel movement on the day of admission.  Planning for monitoring 1 more night with possible plan to discharge home tomorrow as per general surgery  Assessment & Plan:  Principal Problem:   Intractable nausea and vomiting Active Problems:   GERD (gastroesophageal reflux disease)   Depression   Essential hypertension   Pancreatic cancer   Adynamic ileus   Diarrhea   Intractable nausea/vomiting/ileitis/partial SBO: Started on IV fluids, antiemetics.  Xeloda on hold.  CT abdomen showed possible ileus versus distal small bowel obstruction. KUB  showed features of ileus, could not rule out partial small bowel obstruction.  General surgery already following. She started having multiple liquidy bowel movements since 4/11. Has some mild abdominal distention today but tolerating soft diet.  Continue bowel regimen.  General surgery recommend to monitor 1 more day with current diet.   GERD: Continue PPI   History of pancreatic adenocarcinoma: Follows with oncology. she had a ex lap and cholecystectomy and they aborted the whipple as they could not get clear margins . CT abdomen/pelvis showed stable pancreatic atrophy and ductal dilatation. Stable stranding/interstitial changes around the pancreatic head and adjacent duodenum.  Lipase level normal. Sent message to Dr [name] about her presence here.   Depression: Continue Wellbutrin,  Lexapro, BuSpar, Seroquel          DVT prophylaxis:heparin injection 5,000 Units Start: 05/06/22 0600 SCDs Start: 05/06/22 0442     Code Status: Full Code  Family Communication: Called both sons and daughter on phone for update, calls received  Patient status:Obs  Patient is from :Home  Anticipated discharge [id number]  Estimated DC date:tomorrow   Consultants: General surgery  Procedures: None  Antimicrobials:  Anti-infectives (From admission, onward)    None       Subjective:  Patient seen and examined at bedside today.  Hemodynamically stable.  Had multiple bowel movements yesterday.  None today.  Complains of some mild abdominal distention today but denies any significant abdominal discomfort or pain.  No nausea or vomiting.  Tolerating soft diet Objective: Vitals:   05/06/22 1734 05/06/22 2016 05/07/22 0151 05/07/22 0420  BP: 124/63 111/83 (!) 106/55 (!) 120/99  Pulse: 76 80 77 79  Resp: 18 18 16 18   Temp: 98.4 F (36.9 C) 97.9 F (36.6 C) 98 F (36.7 C) 98.7 F (37.1 C)  TempSrc: Oral Oral  Oral  SpO2: 94% 96% 94% 95%  Weight:        Intake/Output Summary (Last 24 hours) at 05/07/2022 1206 Last data filed at 05/07/2022 9233 Gross per 24 hour  Intake 240 ml  Output --  Net 240 ml   Filed Weights   05/05/22 2136  Weight: 56.9 kg    Examination:  General exam: Overall comfortable, not in distress HEENT: PERRL Respiratory system:  no wheezes or crackles  Cardiovascular system: S1 & S2 heard, RRR.  Gastrointestinal system: Abdomen is distended, soft and nontender.BS present Central nervous system: Alert and oriented Extremities: No edema, no clubbing ,no cyanosis Skin: No rashes, no ulcers,no  icterus     Data Reviewed: I have personally reviewed following labs and imaging studies  CBC: Recent Labs  Lab 05/04/22 1314 05/29/22 2258 05/06/22 0528  WBC 5.0 4.9 4.1  NEUTROABS 3.6  --  2.7  HGB 11.2* 10.4* 9.8*  HCT 34.0* 31.8* 30.0*   MCV 98.3 97.5 98.7  PLT 197 181 172   Basic Metabolic Panel: Recent Labs  Lab 05/04/22 1314 May 29, 2022 2258 05/06/22 0528 05/07/22 0415  NA 135 134* 133* 138  K 3.1* 3.6 3.9 3.6  CL 100 101 102 106  CO2 GLUCOSE 99 117* 102* 89  BUN CREATININE 0.70 0.57 0.52 0.66  CALCIUM 8.4* 8.1* 7.9* 7.9*  MG 1.8  --  2.0  --      No results found for this or any previous visit (from the past 240 hour(s)).   Radiology Studies: DG Abd 1 View  Result Date: 05/06/2022 CLINICAL DATA:  Nausea and vomiting EXAM: ABDOMEN - 1 VIEW COMPARISON:  Previous studies including the CT done on 05/29/22 FINDINGS: There is dilation of small bowel loops measuring up to 3.3 cm in diameter. There is prominence of mucosal folds in dilated small bowel loops. Gas and stool are present in the colon. Stomach is not distended. Degenerative changes and postsurgical changes are noted in the lumbar spine. There is contrast in the urinary bladder from previous CT. IMPRESSION: Lays mild dilation of small bowel loops suggesting ileus or partial obstruction. Electronically Signed   By: Ernie Avena M.D.   On: 05/06/2022 08:18   CT ABDOMEN PELVIS W CONTRAST  Result Date: 05/06/2022 CLINICAL DATA:  Abdominal pain, acute, nonlocalized. Vomiting. History of pancreatic cancer. EXAM: CT ABDOMEN AND PELVIS WITH CONTRAST TECHNIQUE: Multidetector CT imaging of the abdomen and pelvis was performed using the standard protocol following bolus administration of intravenous contrast. RADIATION DOSE REDUCTION: This exam was performed according to the departmental dose-optimization program which includes automated exposure control, adjustment of the mA and/or kV according to patient size and/or use of iterative reconstruction technique. CONTRAST:  OMNIPAQUE IOHEXOL 300 MG/ML  SOLN COMPARISON:  04/28/2022 FINDINGS: Lower chest: Trace bilateral pleural effusion. Left base atelectasis. Hepatobiliary: Prior  cholecystectomy. No biliary ductal dilatation or suspicious focal hepatic abnormality. Pancreas: Severe atrophy of the pancreatic body and tail with ductal dilatation. Appearance is stable since prior study. Spleen: Two separate spleens again noted. No focal abnormality. No change. Adrenals/Urinary Tract: Bilateral renal parapelvic cysts, stable. No hydronephrosis. Adrenal glands and urinary bladder unremarkable. Stomach/Bowel: Dilated small bowel loops into the pelvis. Small bowel is fairly dilated to the ileocecal valve without significant caliber change. Moderate stool burden throughout the colon which is normal caliber. Stomach decompressed. Vascular/Lymphatic: Aortic atherosclerosis. No evidence of aneurysm or adenopathy. Reproductive: Uterus and adnexa unremarkable.  No mass. Other: Small amount of free fluid in the cul-de-sac, increased since prior study. Interstitial changes/stranding around the pancreatic head and 2/3 portions of the duodenum are stable since prior study and may be related to radiation changes. Musculoskeletal: Postoperative changes in the lumbar spine. Degenerative changes in the thoracolumbar spine. No acute bony abnormality. IMPRESSION: Dilated small bowel diffusely without significant caliber change in the distal small bowel. This could reflect ileus although the colon is not significantly dilated. Distal small bowel obstruction difficult to completely exclude. Small amount of free fluid in the pelvis. Trace bilateral pleural effusions. Stable pancreatic atrophy and ductal dilatation. Stable stranding/interstitial changes around the pancreatic head and  adjacent duodenum. Aortic atherosclerosis. Electronically Signed   By: Charlett Nose M.D.   On: 05/06/2022 00:11    Scheduled Meds:  buPROPion  300 mg Oral QPC breakfast   busPIRone  20 mg Oral BID   Chlorhexidine Gluconate Cloth  6 each Topical Daily   escitalopram  20 mg Oral QHS   fiber  1 packet Oral BID   heparin  5,000 Units  Subcutaneous Q8H   mirtazapine  30 mg Oral QHS   pantoprazole (PROTONIX) IV  40 mg Intravenous Q24H   QUEtiapine  25 mg Oral TID PC   simethicone  80 mg Oral QID   Continuous Infusions:  lactated ringers 100 mL/hr at 05/06/22 2024     LOS: 0 days   Burnadette Pop, MD [facility] P4/12/2022, 12:06 PM

## 2022-05-07 NOTE — Care Management Obs Status (Signed)
MEDICARE OBSERVATION STATUS NOTIFICATION   Patient Details  Name: Sheryl Bender MRN: [id number] Date of Birth: March 03, 1945   Medicare Observation Status Notification Given:  Yes    [name] 05/07/2022, 2:02 PM

## 2022-05-07 NOTE — Discharge Instructions (Signed)
Xeloda Diarrhea  Try eating bland foods, such as white rice and boiled or baked chicken. Avoid raw fruits, vegetables, whole-grain breads, cereals, and seeds. Soluble fiber is found in some foods and absorbs fluid, which can help relieve diarrhea. Foods high in soluble fiber include: applesauce, bananas (ripe), canned fruit, orange sections, boiled potatoes, white rice, products made with white flour, oatmeal, cream of rice, cream of wheat, and farina.

## 2022-05-07 NOTE — Progress Notes (Signed)
[facility] Progress Note     Subjective: Feels less distended. Eating her diet. Had a diarrhea Bm this Am. Worried about going home.   Objective: Vital signs in last 24 hours: Temp:  [97.9 F (36.6 C)-98.7 F (37.1 C)] 98.4 F (36.9 C) (04/12 1404) Pulse Rate:  [77-85] 85 (04/12 1404) Resp:  [16-18] 18 (04/12 1404) BP: (106-120)/(55-99) 120/71 (04/12 1404) SpO2:  [94 %-99 %] 99 % (04/12 1404) Last BM Date : 05/06/22  Intake/Output from previous day: 04/11 0701 - 04/12 0700 In: 646.7 [P.O.:240; I.V.:406.7] Out: -  Intake/Output this shift: Total I/O In: 2136.6 [P.O.:360; I.V.:1776.6] Out: -   General appearance: alert and no distress GI: soft, mildly distended, nontender  Lab Results:  Recent Labs    2022/05/22 2258 05/06/22 0528  WBC 4.9 4.1  HGB 10.4* 9.8*  HCT 31.8* 30.0*  PLT 181 172   BMET Recent Labs    05/06/22 0528 05/07/22 0415  NA 133* 138  K 3.9 3.6  CL 102 106  CO2 27 28  GLUCOSE 102* 89  BUN 12 9  CREATININE 0.52 0.66  CALCIUM 7.9* 7.9*   PT/INR No results for input(s): "LABPROT", "INR" in the last 72 hours.  Studies/Results: DG Abd 1 View  Result Date: 05/06/2022 CLINICAL DATA:  Nausea and vomiting EXAM: ABDOMEN - 1 VIEW COMPARISON:  Previous studies including the CT done on 05-22-22 FINDINGS: There is dilation of small bowel loops measuring up to 3.3 cm in diameter. There is prominence of mucosal folds in dilated small bowel loops. Gas and stool are present in the colon. Stomach is not distended. Degenerative changes and postsurgical changes are noted in the lumbar spine. There is contrast in the urinary bladder from previous CT. IMPRESSION: Lays mild dilation of small bowel loops suggesting ileus or partial obstruction. Electronically Signed   By: Ernie Avena M.D.   On: 05/06/2022 08:18   CT ABDOMEN PELVIS W CONTRAST  Result Date: 05/06/2022 CLINICAL DATA:  Abdominal pain, acute, nonlocalized. Vomiting. History  of pancreatic cancer. EXAM: CT ABDOMEN AND PELVIS WITH CONTRAST TECHNIQUE: Multidetector CT imaging of the abdomen and pelvis was performed using the standard protocol following bolus administration of intravenous contrast. RADIATION DOSE REDUCTION: This exam was performed according to the departmental dose-optimization program which includes automated exposure control, adjustment of the mA and/or kV according to patient size and/or use of iterative reconstruction technique. CONTRAST:  OMNIPAQUE IOHEXOL 300 MG/ML  SOLN COMPARISON:  04/28/2022 FINDINGS: Lower chest: Trace bilateral pleural effusion. Left base atelectasis. Hepatobiliary: Prior cholecystectomy. No biliary ductal dilatation or suspicious focal hepatic abnormality. Pancreas: Severe atrophy of the pancreatic body and tail with ductal dilatation. Appearance is stable since prior study. Spleen: Two separate spleens again noted. No focal abnormality. No change. Adrenals/Urinary Tract: Bilateral renal parapelvic cysts, stable. No hydronephrosis. Adrenal glands and urinary bladder unremarkable. Stomach/Bowel: Dilated small bowel loops into the pelvis. Small bowel is fairly dilated to the ileocecal valve without significant caliber change. Moderate stool burden throughout the colon which is normal caliber. Stomach decompressed. Vascular/Lymphatic: Aortic atherosclerosis. No evidence of aneurysm or adenopathy. Reproductive: Uterus and adnexa unremarkable.  No mass. Other: Small amount of free fluid in the cul-de-sac, increased since prior study. Interstitial changes/stranding around the pancreatic head and 2/3 portions of the duodenum are stable since prior study and may be related to radiation changes. Musculoskeletal: Postoperative changes in the lumbar spine. Degenerative changes in the thoracolumbar spine. No acute bony abnormality. IMPRESSION: Dilated small bowel diffusely  without significant caliber change in the distal small bowel. This could  reflect ileus although the colon is not significantly dilated. Distal small bowel obstruction difficult to completely exclude. Small amount of free fluid in the pelvis. Trace bilateral pleural effusions. Stable pancreatic atrophy and ductal dilatation. Stable stranding/interstitial changes around the pancreatic head and adjacent duodenum. Aortic atherosclerosis. Electronically Signed   By: Charlett Nose M.D.   On: 05/06/2022 00:11    Anti-infectives: Anti-infectives (From admission, onward)    None       Assessment/Plan: Patient with what is likely dysmotility issues after having xeloda diarrhea and taking imodium. She is feeling better.  She is on fiber supplementation and gas x.   She is nervous about discharge today given the continued diarrhea.   Xeloda Diarrhea  Try eating bland foods, such as white rice and boiled or baked chicken. Avoid raw fruits, vegetables, whole-grain breads, cereals, and seeds. Soluble fiber is found in some foods and absorbs fluid, which can help relieve diarrhea. Foods high in soluble fiber include: applesauce, bananas (ripe), canned fruit, orange sections, boiled potatoes, white rice, products made with white flour, oatmeal, cream of rice, cream of wheat, and farina.  Updated team. Hopefully home in 24-48 hours after eating and having less diarrhea.    LOS: 0 days    Lucretia Roers 05/07/2022

## 2022-05-08 DIAGNOSIS — R112 Nausea with vomiting, unspecified: Secondary | ICD-10-CM | POA: Diagnosis not present

## 2022-05-08 DIAGNOSIS — R197 Diarrhea, unspecified: Secondary | ICD-10-CM | POA: Diagnosis not present

## 2022-05-08 LAB — BASIC METABOLIC PANEL
Anion gap: 3 — ABNORMAL LOW (ref 5–15)
BUN: 6 mg/dL — ABNORMAL LOW (ref 8–23)
CO2: 27 mmol/L (ref 22–32)
Calcium: 7.7 mg/dL — ABNORMAL LOW (ref 8.9–10.3)
Chloride: 107 mmol/L (ref 98–111)
Creatinine, Ser: 0.64 mg/dL (ref 0.44–1.00)
GFR, Estimated: >60 mL/min (ref >60)
Glucose, Bld: 93 mg/dL (ref 70–99)
Potassium: 3.2 mmol/L — ABNORMAL LOW (ref 3.5–5.1)
Sodium: 137 mmol/L (ref 135–145)

## 2022-05-08 MED ORDER — POTASSIUM CHLORIDE CRYS ER 20 MEQ PO TBCR
40.0000 meq | EXTENDED_RELEASE_TABLET | Freq: Once | ORAL | Status: AC
  Administered 2022-05-08: 40 meq via ORAL
  Filled 2022-05-08: qty 2

## 2022-05-08 MED ORDER — BISACODYL 10 MG RE SUPP
10.0000 mg | Freq: Once | RECTAL | Status: AC
  Administered 2022-05-08: 10 mg via RECTAL
  Filled 2022-05-08: qty 1

## 2022-05-08 NOTE — Progress Notes (Signed)
[facility] Progress Note     Subjective: Doing fair. Feels like she is having cramps. Received suppository this AM and had a Bm mixed with formed and loose stool. She is nervous about home due to being alone. She denies 3BM yesterday. She says she may have had one.   Objective: Vital signs in last 24 hours: Temp:  [98.4 F (36.9 C)-99.3 F (37.4 C)] 99.3 F (37.4 C) (04/13 0430) Pulse Rate:  [85-92] 91 (04/13 0911) Resp:  [16-18] 16 (04/13 0430) BP: (120-129)/(64-71) 124/64 (04/13 0911) SpO2:  [92 %-99 %] 92 % (04/13 0430) Last BM Date : 05/06/22  Intake/Output from previous day: 04/12 0701 - 04/13 0700 In: 2496.6 [P.O.:720; I.V.:1776.6] Out: -  Intake/Output this shift: Total I/O In: 120 [P.O.:120] Out: -   General appearance: alert and no distress GI: less distended, mildly tender  Lab Results:  Recent Labs    05/05/22 2258 05/06/22 0528  WBC 4.9 4.1  HGB 10.4* 9.8*  HCT 31.8* 30.0*  PLT 181 172   BMET Recent Labs    05/07/22 0415 05/08/22 0340  NA 138 137  K 3.6 3.2*  CL 106 107  CO2 28 27  GLUCOSE 89 93  BUN 9 6*  CREATININE 0.66 0.64  CALCIUM 7.9* 7.7*   PT/INR No results for input(s): "LABPROT", "INR" in the last 72 hours.  Studies/Results: No results found.  Anti-infectives: Anti-infectives (From admission, onward)    None       Assessment/Plan: Patient with some dysmotility issues after having diarrhea likely from xeloda and  taking imodium 4mg  BID. She is off imodium. She had a BM today. I encouraged her to try to manage her with fiber and less imodium.  She reports some cramping today.   Could try bentyl if continues to have cramping No operative intervention needed Will sign off.  Discussed with Dr. Margo Aye.   LOS: 0 days    Lucretia Roers 05/08/2022

## 2022-05-08 NOTE — Progress Notes (Signed)
PROGRESS NOTE  Sheryl Bender  ZOX:[id number] DOB: 12/17/1945 DOA: 05/05/2022 PCP: [name], DO   Brief Narrative: Patient is a 77 year old female with history of anxiety, depression, GERD, pancreatic adenocarcinoma, PE who presents with complaint of vomiting.  Recent history of diarrhea and was taking Imodium which subsequently caused constipation and then she started nauseated and vomiting ,patient also reported abdominal pain.  Abdominal imaging showed possible ileus versus partial SBO.  General surgery also consulted. She started having large liquid bowel movement on the day of admission.  Seen by general surgery, following.  05/08/2022: The patient was seen and examined at bedside.  States she feels weak.  Nausea is improved.  No reported vomiting today.  Plan to monitor another day per surgery, possible discharge tomorrow 05/09/22.  PT OT evaluation with fall precautions added.   Assessment & Plan:  Principal Problem:   Intractable nausea and vomiting Active Problems:   GERD (gastroesophageal reflux disease)   Depression   Essential hypertension   Pancreatic cancer   Adynamic ileus   Diarrhea   Improving intractable nausea/vomiting/ileitis/partial SBO:  Currently on IV fluid, as needed antiemetics Admission CT abdomen and pelvis showed possible ileus versus distal small bowel obstruction.  Seen by general surgery, following. Dulcolax suppository given on 05/08/2022 Monitor stool output Tolerating a diet Continue to monitor today with possible discharge on 05/09/2022.  Generalized weakness PT OT assessment Fall precautions Lives at home alone.  GERD Continue Protonix.   History of pancreatic adenocarcinoma:  Follows with oncology. she had a ex lap and cholecystectomy and they aborted the whipple as they could not get clear margins . CT abdomen/pelvis showed stable pancreatic atrophy and ductal dilatation. Stable stranding/interstitial changes around the pancreatic head  and adjacent duodenum.  Lipase level normal. Followed by [name], medical oncology, outpatient.  Chronic anxiety/depression Resume home regimen.   Hypokalemia Serum potassium 3.2 Repleted intravenously. Obtain magnesium level in the morning and repeat BMP.   Anemia of chronic disease Likely related to her pancreatic adenocarcinoma No reported overt bleeding Hemoglobin 9.8, continue to monitor.    DVT prophylaxis:heparin injection 5,000 Units Start: 05/06/22 0600 SCDs Start: 05/06/22 0442     Code Status: Full Code  Family Communication: None at bedside.  Patient status:Obs  Patient is from :Home  Anticipated discharge WJ:XBJY  Estimated DC date: 05/09/2022.   Consultants: General surgery  Procedures: None  Antimicrobials:  Anti-infectives (From admission, onward)    None        Objective: Vitals:   05/07/22 1404 05/07/22 2013 05/08/22 0430 05/08/22 0911  BP: 120/71 129/66 123/71 124/64  Pulse: 85 87 92 91  Resp: Temp: 98.4 F (36.9 C) 98.4 F (36.9 C) 99.3 F (37.4 C)   TempSrc: Oral Oral Oral   SpO2: 99% 96% 92%   Weight:        Intake/Output Summary (Last 24 hours) at 05/08/2022 1143 Last data filed at 05/08/2022 0849 Gross per 24 hour  Intake 2376.6 ml  Output --  Net 2376.6 ml   Filed Weights   05/05/22 2136  Weight: 56.9 kg    Examination:  General exam: Frail-appearing no acute distress.  She is alert oriented x 3. HEENT: PERRL Respiratory system: No wheezes or rales.  Poor inspiratory effort. Cardiovascular system: Regular rate and rhythm no rubs or gallops. Gastrointestinal system: Abdomen is mildly distended.  Hypoactive bowel sounds noted. Central nervous system: Alert and oriented x 3. Extremities: No peripheral edema. Skin: No rashes or  ulcerative lesions noted.   Data Reviewed: I have personally reviewed following labs and imaging studies  CBC: Recent Labs  Lab 05/04/22 1314 05/05/22 2258  05/06/22 0528  WBC 5.0 4.9 4.1  NEUTROABS 3.6  --  2.7  HGB 11.2* 10.4* 9.8*  HCT 34.0* 31.8* 30.0*  MCV 98.3 97.5 98.7  PLT 197 181 172   Basic Metabolic Panel: Recent Labs  Lab 05/04/22 1314 05/05/22 2258 05/06/22 0528 05/07/22 0415 05/08/22 0340  NA 135 134* 133* 138 137  K 3.1* 3.6 3.9 3.6 3.2*  CL 100 101 102 106 107  CO2 26 26 27 28 27   GLUCOSE 99 117* 102* 89 93  BUN 18 13 12 9  6*  CREATININE 0.70 0.57 0.52 0.66 0.64  CALCIUM 8.4* 8.1* 7.9* 7.9* 7.7*  MG 1.8  --  2.0  --   --      No results found for this or any previous visit (from the past 240 hour(s)).   Radiology Studies: No results found.  Scheduled Meds:  buPROPion  300 mg Oral QPC breakfast   busPIRone  20 mg Oral BID   Chlorhexidine Gluconate Cloth  6 each Topical Daily   escitalopram  20 mg Oral QHS   fiber  1 packet Oral BID   heparin  5,000 Units Subcutaneous Q8H   mirtazapine  30 mg Oral QHS   pantoprazole (PROTONIX) IV  40 mg Intravenous Q24H   QUEtiapine  25 mg Oral TID PC   simethicone  80 mg Oral QID   Continuous Infusions:  sodium chloride 50 mL/hr at 05/07/22 1528     LOS: 0 days   Darlin Drop, MD [facility] P4/13/2024, 11:43 AM

## 2022-05-09 DIAGNOSIS — R112 Nausea with vomiting, unspecified: Secondary | ICD-10-CM | POA: Diagnosis not present

## 2022-05-09 LAB — BASIC METABOLIC PANEL
Anion gap: 6 (ref 5–15)
BUN: 6 mg/dL — ABNORMAL LOW (ref 8–23)
CO2: 29 mmol/L (ref 22–32)
Calcium: 8.2 mg/dL — ABNORMAL LOW (ref 8.9–10.3)
Chloride: 107 mmol/L (ref 98–111)
Creatinine, Ser: 0.69 mg/dL (ref 0.44–1.00)
GFR, Estimated: >60 mL/min (ref >60)
Glucose, Bld: 91 mg/dL (ref 70–99)
Potassium: 4.1 mmol/L (ref 3.5–5.1)
Sodium: 142 mmol/L (ref 135–145)

## 2022-05-09 LAB — MAGNESIUM: Magnesium: 1.9 mg/dL (ref 1.7–2.4)

## 2022-05-09 MED ORDER — PANTOPRAZOLE SODIUM 40 MG PO TBEC: 40.0000 mg | DELAYED_RELEASE_TABLET | Freq: Every day | ORAL | Status: DC

## 2022-05-09 MED ORDER — HEPARIN SOD (PORK) LOCK FLUSH 100 UNIT/ML IV SOLN
500.0000 [IU] | Freq: Once | INTRAVENOUS | Status: AC
  Administered 2022-05-09: 500 [IU] via INTRAVENOUS
  Filled 2022-05-09: qty 5

## 2022-05-09 MED ORDER — SIMETHICONE 80 MG PO CHEW: 80.0000 mg | CHEWABLE_TABLET | Freq: Four times a day (QID) | ORAL | 0 refills | Status: DC

## 2022-05-09 MED ORDER — NUTRISOURCE FIBER PO PACK: 1.0000 | PACK | Freq: Two times a day (BID) | ORAL | 0 refills | Status: AC

## 2022-05-09 MED ORDER — METOPROLOL SUCCINATE ER 25 MG PO TB24: 12.5000 mg | ORAL_TABLET | Freq: Every day | ORAL | 0 refills | Status: DC

## 2022-05-09 NOTE — Evaluation (Signed)
Physical Therapy Evaluation Patient Details Name: Sheryl Bender MRN: [id number] DOB: 10/09/1945 Today's Date: 05/09/2022  History of Present Illness  Sheryl Bender is a 77 y.o. female with medical history significant of anxiety, depression, GERD, pancreatic adenocarcinoma, PE, and more presents the ED with a chief complaint of projectile vomiting.  Patient reports that her GI system has been "out of whack."  She reports 1 week ago she was having diarrhea.  She took Imodium.  She was taking it once or twice a day.  When the diarrhea stopped she no acute of bowel movements.  She then started having nausea and vomiting.  She reports she can eat, because anything would come right back up.  She called her oncologist who told her she can eat solid foods and to drink 5 boost per day.  On the day of presentation she was working her second boost when it came right back up, and she decided she could not stay at home.  Patient reports she has not had a bowel movement since Monday.  Normal for her is every other day.  Since being on Xeloda she has been having a bowel movement every day.  She is on fluid and radiation for about 5 weeks.  Patient denies any hematochezia or melena.  She does have associated abdominal pain that feels like a pressure and an ache.  She has had worsening acid reflux since being on Xeloda as well.  Her PPI helps with the reflux.  Patient reports that the pain in her abdomen moves around.  On the day of presentation she felt her abdomen was so tight  That she might have a baby.  Now it is just sore, but the tightness in the distention has improved per her report.  She denies any fevers.  She reports a change in her urine output, with feeling like she has the urge to go but cannot get a few drops out.  She does understand that this is likely due to dehydration since she has not been able to eat or drink.  Patient reports a decrease in flatulence as well.  She has no other complaints at this time.    Clinical Impression  Patient functioning near baseline for functional mobility and gait other than occasional drifting left/right during ambulation, able to self correct without loss of balance and limited mostly due to c/o fatigue.  Patient tolerated sitting up in chair after therapy.  Patient will benefit from continued skilled physical therapy in hospital and recommended venue below to increase strength, balance, endurance for safe ADLs and gait.          Recommendations for follow up therapy are one component of a multi-disciplinary discharge planning process, led by the attending physician.  Recommendations may be updated based on patient status, additional functional criteria and insurance authorization.  Follow Up Recommendations       Assistance Recommended at Discharge PRN  Patient can return home with the following  A little help with walking and/or transfers;Help with stairs or ramp for entrance;Assistance with cooking/housework    Equipment Recommendations None recommended by PT  Recommendations for Other Services       Functional Status Assessment Patient has had a recent decline in their functional status and demonstrates the ability to make significant improvements in function in a reasonable and predictable amount of time.     Precautions / Restrictions Precautions Precautions: Fall Restrictions Weight Bearing Restrictions: No      Mobility  Bed Mobility Overal bed  mobility: Modified Independent                  Transfers Overall transfer level: Modified independent                      Ambulation/Gait Ambulation/Gait assistance: Supervision, Min guard Gait Distance (Feet): 100 Feet Assistive device: None Gait Pattern/deviations: Decreased step length - right, Decreased step length - left, Decreased stride length, Drifts right/left Gait velocity: decreased     General Gait Details: slightly labored cadence with occasional drifting  left/right without loss of balance without requring use of an AD, limited mostly due to fatigue  Stairs            Wheelchair Mobility    Modified Rankin (Stroke Patients Only)       Balance Overall balance assessment: Needs assistance Sitting-balance support: Feet supported, No upper extremity supported Sitting balance-Leahy Scale: Good Sitting balance - Comments: seated at EOB   Standing balance support: During functional activity, No upper extremity supported Standing balance-Leahy Scale: Fair Standing balance comment: fair/good                             Pertinent Vitals/Pain Pain Assessment Pain Assessment: 0-10 Pain Score: 6  Pain Location: right side of stomach Pain Descriptors / Indicators: Discomfort, Sore Pain Intervention(s): Limited activity within patient's tolerance, Monitored during session, Repositioned    Home Living Family/patient expects to be discharged to:: Private residence Living Arrangements: Alone Available Help at Discharge: Other (Comment);Family;Available PRN/intermittently;Friend(s) Type of Home: House Home Access: Stairs to enter Entrance Stairs-Rails: None Entrance Stairs-Number of Steps: 1   Home Layout: One level Home Equipment: Agricultural consultant (2 wheels);Shower seat      Prior Function Prior Level of Function : Independent/Modified Independent             Mobility Comments: household and short distanced community ambulator without AD, has stopped driving recently ADLs Comments: Independent with all ADL's     Hand Dominance   Dominant Hand: Right    Extremity/Trunk Assessment   Upper Extremity Assessment Upper Extremity Assessment: Overall WFL for tasks assessed    Lower Extremity Assessment Lower Extremity Assessment: Generalized weakness    Cervical / Trunk Assessment Cervical / Trunk Assessment: Kyphotic  Communication   Communication: No difficulties  Cognition Arousal/Alertness:  Awake/alert Behavior During Therapy: WFL for tasks assessed/performed Overall Cognitive Status: Within Functional Limits for tasks assessed                                          General Comments      Exercises     Assessment/Plan    PT Assessment Patient needs continued PT services  PT Problem List Decreased strength;Decreased activity tolerance;Decreased balance;Decreased mobility       PT Treatment Interventions DME instruction;Gait training;Stair training;Functional mobility training;Therapeutic activities;Therapeutic exercise;Patient/family education;Balance training    PT Goals (Current goals can be found in the Care Plan section)  Acute Rehab PT Goals Patient Stated Goal: return home with family to assist PT Goal Formulation: With patient Time For Goal Achievement: 05/14/22 Potential to Achieve Goals: Good    Frequency Min 2X/week     Co-evaluation               AM-PAC PT "6 Clicks" Mobility  Outcome Measure Help needed turning from  your back to your side while in a flat bed without using bedrails?: None Help needed moving from lying on your back to sitting on the side of a flat bed without using bedrails?: None Help needed moving to and from a bed to a chair (including a wheelchair)?: None Help needed standing up from a chair using your arms (e.g., wheelchair or bedside chair)?: None Help needed to walk in hospital room?: A Little Help needed climbing 3-5 steps with a railing? : A Little 6 Click Score: 22    End of Session   Activity Tolerance: Patient tolerated treatment well;Patient limited by fatigue Patient left: in chair;with call bell/phone within reach Nurse Communication: Mobility status PT Visit Diagnosis: Unsteadiness on feet (R26.81);Other abnormalities of gait and mobility (R26.89);Muscle weakness (generalized) (M62.81)    Time: 4917-9150 PT Time Calculation (min) (ACUTE ONLY): 20 min   Charges:   PT Evaluation $PT  Eval Moderate Complexity: 1 Mod PT Treatments $Therapeutic Activity: 8-22 mins        11:50 AM, 05/09/22 [name], MPT Physical Therapist with [facility] [phone] office [phone] mobile phone

## 2022-05-09 NOTE — Discharge Summary (Signed)
Discharge Summary  Sheryl Bender ONG:[id number] DOB: 11/12/1945  PCP: [name], DO  Admit date: 05/05/2022 Discharge date: 05/09/2022  Time spent: 35 minutes.  Recommendations for Outpatient Follow-up:  Follow up with your primary care provider in 1 to 2 weeks. Follow-up with your medical oncologist/radiation oncologist within a week. Take your medications as prescribed. Continue PT with fall precautions.  Discharge Diagnoses:  Active Hospital Problems   Diagnosis Date Noted   Intractable nausea and vomiting 05/06/2022   GERD (gastroesophageal reflux disease) 07/13/2021    Priority: 3.   Depression 07/13/2021    Priority: 6.   Essential hypertension 07/15/2021    Priority: 7.   Adynamic ileus 05/06/2022   Diarrhea 05/06/2022   Pancreatic cancer 02/15/2022    Resolved Hospital Problems  No resolved problems to display.    Discharge Condition: Stable.  Diet recommendation: Resume previous diet.  Vitals:   05/09/22 0420 05/09/22 0823  BP: 104/61 121/69  Pulse: 83 95  Resp: 18   Temp: 98 F (36.7 C)   SpO2: 92% 95%    History of present illness:  Patient is a 77 year old female with history of anxiety, depression, GERD, pancreatic adenocarcinoma, PE who presents with complaint of nausea and vomiting.  Recent history of diarrhea, was taking Imodium which subsequently caused constipation and then nausea and vomiting.  Endorsed associated abdominal pain.    Workup revealed ileus versus partial SBO.  Seen by general surgery.  Her symptoms resolved.  She was able to have multiple bowel movements with no recurrent vomiting.  Okay to discharge from general surgery.  Seen by PT with recommendation for home health PT.   05/09/2022: The patient was seen and examined at bedside.  There were no acute events overnight.  Vital signs and labs reviewed and are stable.  No new complaints.  States she will call radiation oncology tomorrow to set up an appointment for her last  radiation session.  Hospital Course:  Principal Problem:   Intractable nausea and vomiting Active Problems:   GERD (gastroesophageal reflux disease)   Depression   Essential hypertension   Pancreatic cancer   Adynamic ileus   Diarrhea  Resolved intractable nausea/vomiting/ileus/partial SBO:  Avoid dehydration Avoid use of Imodium Number show primary care provider   Generalized weakness PT recommended home health PT Continue fall precautions   GERD Resume home regimen.   History of pancreatic adenocarcinoma:  Follow-up with medical oncology and radiation oncology.   Chronic anxiety/depression Resume home regimen.   Resolved hypokalemia   Anemia of chronic disease No overt bleeding reported.       Consultants: General surgery    Discharge Exam: BP 121/69 (BP Location: Left Arm)   Pulse 95   Temp 98 F (36.7 C) (Oral)   Resp 18   Wt 56.9 kg   SpO2 95%   BMI 22.22 kg/m  General: 77 y.o. year-old female well developed well nourished in no acute distress.  Alert and oriented x3. Cardiovascular: Regular rate and rhythm with no rubs or gallops.  No thyromegaly or JVD noted.   Respiratory: Clear to auscultation with no wheezes or rales. Good inspiratory effort. Abdomen: Soft nontender nondistended with normal bowel sounds x4 quadrants. Musculoskeletal: No lower extremity edema. 2/4 pulses in all 4 extremities. Skin: No ulcerative lesions noted or rashes, Psychiatry: Mood is appropriate for condition and setting  Discharge Instructions You were cared for by a hospitalist during your hospital stay. If you have any questions about your discharge medications or the care  you received while you were in the hospital after you are discharged, you can call the unit and asked to speak with the hospitalist on call if the hospitalist that took care of you is not available. Once you are discharged, your primary care physician will handle any further medical issues. Please note  that NO REFILLS for any discharge medications will be authorized once you are discharged, as it is imperative that you return to your primary care physician (or establish a relationship with a primary care physician if you do not have one) for your aftercare needs so that they can reassess your need for medications and monitor your lab values.   Allergies as of 05/09/2022       Reactions   Other Hives, Other (See Comments)   Cherry wood just cut- "smelled it and broke out"; cannot tolerate ANY cherry fragrances, either   Cherry Hives   Wound Dressing Adhesive Rash, Other (See Comments)   Band-Aids = local reaction        Medication List     STOP taking these medications    capecitabine 500 MG tablet Commonly known as: XELODA   gabapentin 100 MG capsule Commonly known as: NEURONTIN   lactulose 10 GM/15ML solution Commonly known as: CHRONULAC       TAKE these medications    acetaminophen 500 MG tablet Commonly known as: TYLENOL Take 2 tablets (1,000 mg total) by mouth every 8 (eight) hours as needed for moderate pain, headache or fever. What changed: how much to take   betamethasone dipropionate 0.05 % cream Apply topically 2 (two) times daily.   buPROPion 300 MG 24 hr tablet Commonly known as: WELLBUTRIN XL Take 300 mg by mouth daily after breakfast. Takes differently from prescribed: 20mg  BID   busPIRone 10 MG tablet Commonly known as: BUSPAR Take 20 mg by mouth 3 (three) times daily.   calcium carbonate 500 MG chewable tablet Commonly known as: TUMS - dosed in mg elemental calcium Chew 1 tablet (200 mg of elemental calcium total) by mouth 2 (two) times daily as needed for indigestion or heartburn. What changed: how much to take   cyanocobalamin 500 MCG tablet Commonly known as: VITAMIN B12 Take 1 tablet (500 mcg total) by mouth daily.   escitalopram 20 MG tablet Commonly known as: LEXAPRO Take 20 mg by mouth at bedtime.   fiber Pack packet Take 1 packet  by mouth 2 (two) times daily.   fluorouracil CALGB 52841 1,920 mg/m2 in sodium chloride 0.9 % 150 mL Inject 1,920 mg/m2 into the vein over 48 hr.   lactose free nutrition Liqd Take 237 mLs by mouth 2 (two) times daily between meals.   lidocaine-prilocaine cream Commonly known as: EMLA Apply a small amount to port a cath site (do not rub in) and cover with plastic wrap 1 hour prior to infusion appointments   linaclotide 72 MCG capsule Commonly known as: LINZESS Take 1 capsule (72 mcg total) by mouth daily before breakfast.   methocarbamol 500 MG tablet Commonly known as: ROBAXIN Take 1 tablet (500 mg total) by mouth every 8 (eight) hours as needed for muscle spasms.   metoprolol succinate 25 MG 24 hr tablet Commonly known as: TOPROL-XL Take 0.5 tablets (12.5 mg total) by mouth daily after breakfast. What changed: how much to take   mirtazapine 30 MG tablet Commonly known as: REMERON Take 30 mg by mouth at bedtime.   omeprazole 40 MG capsule Commonly known as: PRILOSEC Take 1 capsule (40  mg total) by mouth daily.   ondansetron 8 MG disintegrating tablet Commonly known as: ZOFRAN-ODT Take 1 tablet (8 mg total) by mouth every 8 (eight) hours as needed for nausea or vomiting.   potassium chloride SA 20 MEQ tablet Commonly known as: KLOR-CON M One po bid x 3 days, then one po once a day What changed:  how much to take how to take this when to take this   QUEtiapine 25 MG tablet Commonly known as: SEROQUEL Take 25 mg by mouth 3 (three) times daily. What changed: Another medication with the same name was removed. Continue taking this medication, and follow the directions you see here.   simethicone 80 MG chewable tablet Commonly known as: MYLICON Chew 1 tablet (80 mg total) by mouth 4 (four) times daily.   SYSTANE OP Place 1 drop into both eyes at bedtime.   Vicks VapoInhaler Inha Inhale 1 puff into the lungs daily as needed (congestion).   Vitamin D3 125 MCG (5000  UT) capsule Take 5,000 Units by mouth every other day.       Allergies  Allergen Reactions   Other Hives and Other (See Comments)    Cherry wood just cut- "smelled it and broke out"; cannot tolerate ANY cherry fragrances, either      Cherry Hives   Wound Dressing Adhesive Rash and Other (See Comments)    Band-Aids = local reaction    Follow-up Information     [name], DO. Call today.   Specialty: Family Medicine Why: Please call for a posthospital follow-up appointment. Contact information: [address] [place] [address] [phone]                  The results of significant diagnostics from this hospitalization (including imaging, microbiology, ancillary and laboratory) are listed below for reference.    Significant Diagnostic Studies: DG Abd 1 View  Result Date: 05/06/2022 CLINICAL DATA:  Nausea and vomiting EXAM: ABDOMEN - 1 VIEW COMPARISON:  Previous studies including the CT done on 2022/05/18 FINDINGS: There is dilation of small bowel loops measuring up to 3.3 cm in diameter. There is prominence of mucosal folds in dilated small bowel loops. Gas and stool are present in the colon. Stomach is not distended. Degenerative changes and postsurgical changes are noted in the lumbar spine. There is contrast in the urinary bladder from previous CT. IMPRESSION: Lays mild dilation of small bowel loops suggesting ileus or partial obstruction. Electronically Signed   By: Ernie Avena M.D.   On: 05/06/2022 08:18   CT ABDOMEN PELVIS W CONTRAST  Result Date: 05/06/2022 CLINICAL DATA:  Abdominal pain, acute, nonlocalized. Vomiting. History of pancreatic cancer. EXAM: CT ABDOMEN AND PELVIS WITH CONTRAST TECHNIQUE: Multidetector CT imaging of the abdomen and pelvis was performed using the standard protocol following bolus administration of intravenous contrast. RADIATION DOSE REDUCTION: This exam was performed according to the departmental dose-optimization program  which includes automated exposure control, adjustment of the mA and/or kV according to patient size and/or use of iterative reconstruction technique. CONTRAST:  OMNIPAQUE IOHEXOL 300 MG/ML  SOLN COMPARISON:  04/28/2022 FINDINGS: Lower chest: Trace bilateral pleural effusion. Left base atelectasis. Hepatobiliary: Prior cholecystectomy. No biliary ductal dilatation or suspicious focal hepatic abnormality. Pancreas: Severe atrophy of the pancreatic body and tail with ductal dilatation. Appearance is stable since prior study. Spleen: Two separate spleens again noted. No focal abnormality. No change. Adrenals/Urinary Tract: Bilateral renal parapelvic cysts, stable. No hydronephrosis. Adrenal glands and urinary bladder unremarkable. Stomach/Bowel:  Dilated small bowel loops into the pelvis. Small bowel is fairly dilated to the ileocecal valve without significant caliber change. Moderate stool burden throughout the colon which is normal caliber. Stomach decompressed. Vascular/Lymphatic: Aortic atherosclerosis. No evidence of aneurysm or adenopathy. Reproductive: Uterus and adnexa unremarkable.  No mass. Other: Small amount of free fluid in the cul-de-sac, increased since prior study. Interstitial changes/stranding around the pancreatic head and 2/3 portions of the duodenum are stable since prior study and may be related to radiation changes. Musculoskeletal: Postoperative changes in the lumbar spine. Degenerative changes in the thoracolumbar spine. No acute bony abnormality. IMPRESSION: Dilated small bowel diffusely without significant caliber change in the distal small bowel. This could reflect ileus although the colon is not significantly dilated. Distal small bowel obstruction difficult to completely exclude. Small amount of free fluid in the pelvis. Trace bilateral pleural effusions. Stable pancreatic atrophy and ductal dilatation. Stable stranding/interstitial changes around the pancreatic head and adjacent  duodenum. Aortic atherosclerosis. Electronically Signed   By: Charlett Nose M.D.   On: 05/06/2022 00:11   CT Abdomen Pelvis W Contrast  Result Date: 04/28/2022 CLINICAL DATA:  Abdominal pain.  History of pancreatic cancer. EXAM: CT ABDOMEN AND PELVIS WITH CONTRAST TECHNIQUE: Multidetector CT imaging of the abdomen and pelvis was performed using the standard protocol following bolus administration of intravenous contrast. RADIATION DOSE REDUCTION: This exam was performed according to the departmental dose-optimization program which includes automated exposure control, adjustment of the mA and/or kV according to patient size and/or use of iterative reconstruction technique. CONTRAST:  OMNIPAQUE IOHEXOL 300 MG/ML  SOLN COMPARISON:  02/08/2022 FINDINGS: Lower chest: No acute pulmonary findings. No pleural effusion. The heart is normal in size. No pericardial effusion. Hepatobiliary: No hepatic lesions or intrahepatic biliary dilatation. The gallbladder is not identified and may be surgically absent or completely collapsed. Mild common bile duct dilatation. Pancreas: Severe atrophy of the pancreatic body and tail with marked pancreatic ductal dilatation with an abrupt cut off at the body head junction region without measurable tumor. Spleen: 2 separate spleens are noted.  No splenic lesions. Adrenals/Urinary Tract: Adrenal glands and kidneys are unremarkable and stable. Stable bilateral parapelvic renal cysts not requiring any further imaging evaluation or follow-up. The bladder is unremarkable. Moderate distension. Stomach/Bowel: Gastric wall thickening and surrounding interstitial changes could be due to gastritis or peptic ulcer disease or possibly secondary to radiation change. Interstitial changes surround the pancreatic head, second and third portions of the duodenum and the mesenteric vessels likely radiation related or inflammatory process. Vascular/Lymphatic: Stable aortic and branch vessel  calcifications but no aneurysm or dissection. The major venous structures are patent. No mesenteric or retroperitoneal lymphadenopathy. Reproductive: The uterus and ovaries are unremarkable. Other: Small amount of free pelvic fluid. Musculoskeletal: No significant bony findings. 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 No worrisome bone lesions. IMPRESSION: 1. 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 2. Interstitial changes surround the pancreatic head, second and third portions of the duodenum and the mesenteric vessels likely radiation related or inflammatory process. 3. Gastric wall thickening and surrounding interstitial changes could be due to gastritis or peptic ulcer disease or possibly secondary to radiation change. 4. Small amount of free pelvic fluid. 5. Aortic atherosclerosis. Aortic Atherosclerosis (ICD10-I70.0). Electronically Signed   By: Rudie Meyer M.D.   On: 04/28/2022 14:43    Microbiology:  No results found for this or any previous visit (from the past 240 hour(s)).   Labs: Basic Metabolic Panel: Recent Labs  Lab 05/04/22 1314 05/05/22 2258 05/06/22 0528 05/07/22 0415 05/08/22 0340 05/09/22 0525  NA 135 134* 133* 138 137 142  K 3.1* 3.6 3.9 3.6 3.2* 4.1  CL 100 101 102 106 107 107  CO2 26 26 27 28 27 29   GLUCOSE 99 117* 102* 89 93 91  BUN 18 13 12 9  6* 6*  CREATININE 0.70 0.57 0.52 0.66 0.64 0.69  CALCIUM 8.4* 8.1* 7.9* 7.9* 7.7* 8.2*  MG 1.8  --  2.0  --   --  1.9   Liver Function Tests: Recent Labs  Lab 05/04/22 1314 05/05/22 2258 05/06/22 0528  AST 20 22 17   ALT 24 21 19   ALKPHOS 80 67 63  BILITOT 0.6 0.2* 0.5  PROT 5.8* 5.3* 4.7*  ALBUMIN 2.9* 2.6* 2.3*   Recent Labs  Lab 05/05/22 2258  LIPASE 17   No results for input(s): "AMMONIA" in the last 168 hours. CBC: Recent Labs   Lab 05/04/22 1314 05/05/22 2258 05/06/22 0528  WBC 5.0 4.9 4.1  NEUTROABS 3.6  --  2.7  HGB 11.2* 10.4* 9.8*  HCT 34.0* 31.8* 30.0*  MCV 98.3 97.5 98.7  PLT 197 181 172   Cardiac Enzymes: No results for input(s): "CKTOTAL", "CKMB", "CKMBINDEX", "TROPONINI" in the last 168 hours. BNP: BNP (last 3 results) No results for input(s): "BNP" in the last 8760 hours.  ProBNP (last 3 results) No results for input(s): "PROBNP" in the last 8760 hours.  CBG: No results for input(s): "GLUCAP" in the last 168 hours.     Signed:  Darlin Drop, MD [facility] 05/09/2022, 3:58 PM

## 2022-05-09 NOTE — Plan of Care (Signed)
  Problem: Acute Rehab PT Goals(only PT should resolve) Goal: Pt Will Go Supine/Side To Sit Outcome: Progressing Flowsheets (Taken 05/09/2022 1151) Pt will go Supine/Side to Sit: Independently Goal: Patient Will Transfer Sit To/From Stand Outcome: Progressing Flowsheets (Taken 05/09/2022 1151) Patient will transfer sit to/from stand: Independently Goal: Pt Will Transfer Bed To Chair/Chair To Bed Outcome: Progressing Flowsheets (Taken 05/09/2022 1151) Pt will Transfer Bed to Chair/Chair to Bed: Independently Goal: Pt Will Ambulate Outcome: Progressing Flowsheets (Taken 05/09/2022 1151) Pt will Ambulate:  > 125 feet  with modified independence  with least restrictive assistive device   11:52 AM, 05/09/22 [name], MPT Physical Therapist with [facility] [phone] office [phone] mobile phone

## 2022-05-09 NOTE — TOC Transition Note (Signed)
Transition of Care [facility]) - CM/SW Discharge Note   Patient Details  Name: Sheryl Bender MRN: [id number] Date of Birth: 1945/02/02  [facility] CM/SW Contact:  Catalina Gravel, LCSW Phone Number: 05/09/2022, 1:13 PM   Clinical Narrative:    CSW met pt at bedside, discussed HHPT recommendation.  Pt in agreement. CSW reached out to [place]- no-due to [place]. [facility]-No contract,[facility]-no. Pt wants service. Will follow up with pt  on cell # by end of day if I do not get a yes from provider, so she will not be expecting a call. DC today     Barriers to Discharge: No Barriers Identified   Patient Goals and CMS Choice      Discharge Placement                         Discharge Plan and Services Additional resources added to the After Visit Summary for                                       Social Determinants of Health (SDOH) Interventions SDOH Screenings   Tobacco Use: Low Risk  (05/05/2022)     Readmission Risk Interventions    07/17/2021    1:22 PM  Readmission Risk Prevention Plan  Transportation Screening Complete  Home Care Screening Complete  Medication Review (RN CM) Complete

## 2022-05-10 NOTE — Progress Notes (Signed)
[facility] [address], [place] [address]    Clinic Day:  05/10/2022  Referring physician: [name], DO  Patient Care Team: [name], DO as PCP - General (Family Medicine) Jena Gauss, Gerrit Friends, MD as Consulting Physician (Gastroenterology) Doreatha Massed, MD as Medical Oncologist (Medical Oncology) Therese Sarah, RN as Oncology Nurse Navigator (Medical Oncology)   ASSESSMENT & PLAN:   Assessment: 1. 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 - EGD/EUS (07/09/2021) by Dr. Meridee Score: The region where the PD is strictured, darker appearance was identified.  There was no clear hypoechoic mass.  Endosonographic appearance suggestive of either chronic pancreatitis changes versus possible malignancy.  Endosonographic staging T1 N0 MX.  Pancreatic parenchymal abnormalities consisting of atrophy and hyperechoic strands.  No sign of significant pathology in the CBD.  No malignant appearing lymph nodes in the celiac region, peripancreatic region and porta hepatic region - Pathology (pancreatic duct FNA): Malignant cells consistent with adenocarcinoma - CA 19-9: 31 (0-35) - CTAP (07/13/2021): Fluid collection extending posterior to the gastric antrum and ventral to the pancreas.  Elongated collection extends to the greater curvature of the stomach with inflammation surrounding the collection suggestive of a pancreatic pseudocyst.  Chronic dilatation of pancreatic duct with abrupt termination of the duct dilatation in the head of the pancreas. - CT chest (07/16/2021): No evidence of metastatic disease in the chest. - 8 to 9 pound weight loss in the last 3 months, decreased appetite. - PET scan (07/30/2021): Faint focus of increased metabolic activity, SUV 3.6, measuring 9 mm at the junction of the pancreatic head and body, suspicious for pancreatic  adenocarcinoma.  No obvious extrapancreatic spread of tumor.  Cystic lesion along the posterior margin of the stomach and anterior margin of the pancreatic head favored pseudocyst.  Small amount of pelvic ascites. - MRI of the abdomen pancreatic protocol (08/03/2021): Similar appearance of the pancreas, with abrupt transition from dilated to decompressed duct in the pancreatic head with no evidence of underlying mass.  Pseudocyst enlarged position between pancreatic head and GE junction with significant mass effect upon the pyloric region and gastric outlet obstruction radiologically.  No evidence of abdominal metastatic disease. - FOLFOX on 09/16/2021, FOLFIRINOX cycle 2 on 09/30/2021 - 02/15/2022: Staging laparoscopy, exploratory laparotomy.  Whipple's aborted due to extensive thick scarring with obliteration of tissue planes preventing safe dissection of SMV. - XRT with Xeloda started on 04/05/2022, Xeloda dose reduced to 1000 mg twice daily on 04/20/2022 due to grade 1 HFSR   2. Social/family history: - She is accompanied by her son [name] today.  She lives at home by herself.  She is independent of ADLs and IADLs.  She did office work prior to retirement.  Non-smoker and nonalcoholic. - Brother (alcoholic) and maternal half uncle had pancreatitis.  No history of malignancy.  3. Pulmonary embolism: - She was diagnosed with pulmonary embolism in January 2023 and then will hospital.  There does not appear to be any provoking factors for PE.  Reportedly Doppler was negative for DVT.  She was treated with Eliquis for 60 days, discontinued after that.   Plan: 1. Stage I (T1 N0 MX) pancreatic adenocarcinoma: - She was seen by me on 04/27/2022 and went to the ER on 04/28/2022 due to weakness, abdominal pain and nausea.  She was also having intermittent diarrhea. - CT AP (04/28/2022): Stable severe atrophy of  the pancreatic body and tail with no clear mass.  Marked pancreatic ductal dilatation stable.  Interstitial  changes around the pancreatic head, second and third portions of duodenum and mesenteric vessels likely radiation related or inflammatory process.  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 - She was sent by Dr. Jeannette How today for fluids.  She has missed XRT on 04/28/2022.  She did not take any Xeloda yesterday.  She took this morning dose of 2 pills. - We reviewed labs from today with normal magnesium, creatinine and LFTs.  Potassium was low at 3.1.  CBC was grossly normal. - I have recommended 1 L of fluid with electrolytes today. - Continue Imodium for diarrhea. - Recommend holding Xeloda for the rest of the week. - She was told to come back on Thursday for labs and more fluids. - RTC 1 week for follow-up.   2.  Weight loss: - Her weight was stable on last visit on 04/27/2022.  However she lost 300 pounds in the last 1 week.  She is not eating much because of decreased taste.  She is drinking 2 boost per day. - I have recommended that she drink boost +3 to 5 cans/day.   3.  Grade 1 HFSR: - Erythema in the bottom of the feet, right more than left is stable. - Continue betamethasone dipropionate 0.05% twice daily.  No orders of the defined types were placed in this encounter.     I,[name],acting as a Neurosurgeon for [employer], MD.,have documented all relevant documentation on the behalf of Doreatha Massed, MD,as directed by  Doreatha Massed, MD while in the presence of Doreatha Massed, MD.  ***   Pura Spice   4/15/20247:06 PM  CHIEF COMPLAINT:   Diagnosis: pancreatic cancer    Cancer Staging  Malignant neoplasm of pancreas Staging form: Exocrine Pancreas, AJCC 8th Edition - Clinical stage from 07/29/2021: Stage IA (cT1, cN0, cM0) - Unsigned    Prior Therapy: FOLFIRINOX   Current Therapy:  Xeloda and radiation    HISTORY OF PRESENT ILLNESS:   Oncology History  Malignant neoplasm of  pancreas  07/21/2021 Initial Diagnosis   Malignant neoplasm of pancreas (HCC)   09/16/2021 -  Chemotherapy   Patient is on Treatment Plan : PANCREAS Modified FOLFIRINOX q14d x 4 cycles      Genetic Testing   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 VUS in ATM called c.4367G>A and in [facility] called c.238G>A also identified. The remainder of testing was negative/normal. The report date is 09/20/2021.  The CancerNext-Expanded + RNAinsight gene panel offered by [employer] and includes sequencing and rearrangement analysis for the following 77 genes: IP, ALK, APC*, ATM*, AXIN2, BAP1, BARD1, BLM, BMPR1A, BRCA1*, BRCA2*, BRIP1*, CDC73, CDH1*,CDK4, CDKN1B, CDKN2A, CHEK2*, CTNNA1, DICER1, FANCC, FH, FLCN, GALNT12, KIF1B, LZTR1, MAX, MEN1, MET, MLH1*, MSH2*, MSH3, MSH6*, MUTYH*, NBN, NF1*, NF2, NTHL1, PALB2*, PHOX2B, PMS2*, POT1, PRKAR1A, PTCH1, PTEN*, RAD51C*, RAD51D*,RB1, RECQL, RET, SDHA, SDHAF2, SDHB, SDHC, SDHD, SMAD4, SMARCA4, SMARCB1, SMARCE1, STK11, SUFU, TMEM127, TP53*,TSC1, TSC2, VHL and XRCC2 (sequencing and deletion/duplication); EGFR, EGLN1, HOXB13, KIT, MITF, PDGFRA, POLD1 and POLE (sequencing only); EPCAM and GREM1 (deletion/duplication only).      INTERVAL HISTORY:   Kimyah is a 77 y.o. female presenting to clinic today for follow up of pancreatic cancer. She was last  seen by me on 05/04/22.  She was hospitalized for x4 days beginning on 05/05/22 for nausea/vomiting. Her imaging in the ED showed a ileus vs partial SBO which resolved without needing intervention.  Today, she states that she is doing well overall. Her appetite level is at ***%. Her energy level is at ***%.   PAST MEDICAL HISTORY:   Past Medical History: Past Medical History:  Diagnosis Date   Anxiety    Arthritis    Depression     Dysrhythmia    hx palpitations greater than 5 yrs -neg echo, stress per pt ? where or dr   GERD (gastroesophageal reflux disease)    occ   Nausea & vomiting 08/24/2021   Pancreatic adenocarcinoma    Pancreatic pseudocyst    Port-A-Cath in place 09/15/2021   Pulmonary embolism    September 2022    Surgical History: Past Surgical History:  Procedure Laterality Date   ANTERIOR LAT LUMBAR FUSION Right 12/16/2015   Procedure: RIGHT LUMBAR TWO-THREE, LUMBAR THREE-FOUR, LUMBAR FOUR-FIVE ANTEROLATERAL LUMBAR INTERBODY FUSION;  Surgeon: Maeola Harman, MD;  Location: [facility] OR;  Service: Neurosurgery;  Laterality: Right;   BALLOON DILATION N/A 08/27/2021   Procedure: BALLOON DILATION;  Surgeon: Meridee Score Netty Starring., MD;  Location: [facility] ENDOSCOPY;  Service: Gastroenterology;  Laterality: N/A;   BIOPSY  07/09/2021   Procedure: BIOPSY;  Surgeon: Meridee Score Netty Starring., MD;  Location: [facility] ENDOSCOPY;  Service: Gastroenterology;;   CYST GASTROSTOMY  08/27/2021   Procedure: CYST GASTROSTOMY;  Surgeon: Lemar Lofty., MD;  Location: [facility] ENDOSCOPY;  Service: Gastroenterology;;   ESOPHAGOGASTRODUODENOSCOPY N/A 07/09/2021   Procedure: ESOPHAGOGASTRODUODENOSCOPY (EGD);  Surgeon: Lemar Lofty., MD;  Location: [facility] ENDOSCOPY;  Service: Gastroenterology;  Laterality: N/A;   ESOPHAGOGASTRODUODENOSCOPY (EGD) WITH PROPOFOL N/A 08/27/2021   Procedure: ESOPHAGOGASTRODUODENOSCOPY (EGD) WITH PROPOFOL;  Surgeon: Meridee Score Netty Starring., MD;  Location: [facility] ENDOSCOPY;  Service: Gastroenterology;  Laterality: N/A;   ESOPHAGOGASTRODUODENOSCOPY (EGD) WITH PROPOFOL N/A 10/08/2021   Procedure: ESOPHAGOGASTRODUODENOSCOPY (EGD) WITH PROPOFOL;  Surgeon: Meridee Score Netty Starring., MD;  Location: [facility] ENDOSCOPY;  Service: Gastroenterology;  Laterality: N/A;   EUS N/A 07/09/2021   Procedure: UPPER ENDOSCOPIC ULTRASOUND (EUS) RADIAL;  Surgeon: Lemar Lofty., MD;  Location: [facility] ENDOSCOPY;  Service: Gastroenterology;   Laterality: N/A;   EUS N/A 08/27/2021   Procedure: UPPER ENDOSCOPIC ULTRASOUND (EUS) LINEAR;  Surgeon: Lemar Lofty., MD;  Location: [facility] ENDOSCOPY;  Service: Gastroenterology;  Laterality: N/A;   FINE NEEDLE ASPIRATION  07/09/2021   Procedure: FINE NEEDLE ASPIRATION (FNA) LINEAR;  Surgeon: Lemar Lofty., MD;  Location: [facility] ENDOSCOPY;  Service: Gastroenterology;;   IR IMAGING GUIDED PORT INSERTION  09/14/2021   LAPAROSCOPY N/A 02/15/2022   Procedure: STAGING DIAGNOSTIC;  Surgeon: Fritzi Mandes, MD;  Location[facility] OR;  Service: General;  Laterality: N/A;   LUMBAR PERCUTANEOUS PEDICLE SCREW 3 LEVEL Bilateral 12/16/2015   Procedure: PERCUTANEOUS PEDICLE SCREWS BILATERALLY AT LUMBAR TWO-FIVE;  Surgeon: Maeola Harman, MD;  Location: [facility] OR;  Service: Neurosurgery;  Laterality: Bilateral;   PANCREATIC STENT PLACEMENT  08/27/2021   Procedure: PANCREATIC STENT PLACEMENT;  Surgeon: Lemar Lofty., MD;  Location: [facility] ENDOSCOPY;  Service: Gastroenterology;;   Francine Graven REMOVAL  10/08/2021   Procedure: STENT REMOVAL;  Surgeon: Lemar Lofty., MD;  Location: [facility] ENDOSCOPY;  Service: Gastroenterology;;   TUBAL LIGATION  1974   WHIPPLE PROCEDURE N/A 02/15/2022   Procedure: ATTEMPTED WHIPPLE PROCEDURE;  Surgeon: Fritzi Mandes, MD;  Location: [facility] OR;  Service: General;  Laterality: N/A;  Social History: Social History   Socioeconomic History   Marital status: Widowed    Spouse name: Not on file   Number of children: Not on file   Years of education: Not on file   Highest education level: Not on file  Occupational History   Not on file  Tobacco Use   Smoking status: Never   Smokeless tobacco: Never  Vaping Use   Vaping Use: Never used  Substance and Sexual Activity   Alcohol use: No   Drug use: Never   Sexual activity: Not on file  Other Topics Concern   Not on file  Social History Narrative   Not on file   Social Determinants of Health   Financial Resource Strain:  Not on file  Food Insecurity: Not on file  Transportation Needs: Not on file  Physical Activity: Not on file  Stress: Not on file  Social Connections: Not on file  Intimate Partner Violence: Not on file    Family History: Family History  Problem Relation Age of Onset   Pancreatitis Brother        alcoholic pancreatitis   Pancreatic cancer Neg Hx    Colon cancer Neg Hx     Current Medications:  Current Outpatient Medications:    acetaminophen (TYLENOL) 500 MG tablet, Take 2 tablets (1,000 mg total) by mouth every 8 (eight) hours as needed for moderate pain, headache or fever. (Patient taking differently: Take 750-1,000 mg by mouth every 8 (eight) hours as needed for moderate pain, headache or fever.), Disp: , Rfl:    Aromatic Inhalants (VICKS VAPOINHALER) INHA, Inhale 1 puff into the lungs daily as needed (congestion)., Disp: , Rfl:    betamethasone dipropionate 0.05 % cream, Apply topically 2 (two) times daily., Disp: 30 g, Rfl: 3   buPROPion (WELLBUTRIN XL) 300 MG 24 hr tablet, Take 300 mg by mouth daily after breakfast. Takes differently from prescribed: 20mg  BID, Disp: , Rfl:    busPIRone (BUSPAR) 10 MG tablet, Take 20 mg by mouth 3 (three) times daily., Disp: , Rfl:    calcium carbonate (TUMS - DOSED IN MG ELEMENTAL CALCIUM) 500 MG chewable tablet, Chew 1 tablet (200 mg of elemental calcium total) by mouth 2 (two) times daily as needed for indigestion or heartburn. (Patient taking differently: Chew 1-2 tablets by mouth 2 (two) times daily as needed for indigestion or heartburn.), Disp: , Rfl:    Cholecalciferol (VITAMIN D3) 125 MCG (5000 UT) CAPS, Take 5,000 Units by mouth every other day., Disp: , Rfl:    escitalopram (LEXAPRO) 20 MG tablet, Take 20 mg by mouth at bedtime., Disp: , Rfl:    fiber (NUTRISOURCE FIBER) PACK packet, Take 1 packet by mouth 2 (two) times daily., Disp: 60 packet, Rfl: 0   fluorouracil CALGB 94076 1,920 mg/m2 in sodium chloride 0.9 % 150 mL, Inject 1,920  mg/m2 into the vein over 48 hr., Disp: , Rfl:    lactose free nutrition (BOOST) LIQD, Take 237 mLs by mouth 2 (two) times daily between meals., Disp: , Rfl:    lidocaine-prilocaine (EMLA) cream, Apply a small amount to port a cath site (do not rub in) and cover with plastic wrap 1 hour prior to infusion appointments, Disp: 30 g, Rfl: 3   linaclotide (LINZESS) 72 MCG capsule, Take 1 capsule (72 mcg total) by mouth daily before breakfast., Disp: 30 capsule, Rfl: 12   methocarbamol (ROBAXIN) 500 MG tablet, Take 1 tablet (500 mg total) by mouth every 8 (eight) hours as  needed for muscle spasms., Disp: 15 tablet, Rfl: 0   metoprolol succinate (TOPROL-XL) 25 MG 24 hr tablet, Take 0.5 tablets (12.5 mg total) by mouth daily after breakfast., Disp: 14 tablet, Rfl: 0   mirtazapine (REMERON) 30 MG tablet, Take 30 mg by mouth at bedtime., Disp: , Rfl:    omeprazole (PRILOSEC) 40 MG capsule, Take 1 capsule (40 mg total) by mouth daily., Disp: 30 capsule, Rfl: 12   ondansetron (ZOFRAN-ODT) 8 MG disintegrating tablet, Take 1 tablet (8 mg total) by mouth every 8 (eight) hours as needed for nausea or vomiting., Disp: 30 tablet, Rfl: 0   Polyethyl Glycol-Propyl Glycol (SYSTANE OP), Place 1 drop into both eyes at bedtime., Disp: , Rfl:    potassium chloride SA (KLOR-CON M) 20 MEQ tablet, One po bid x 3 days, then one po once a day (Patient taking differently: Take 20 mEq by mouth daily. One po bid x 3 days, then one po once a day), Disp: 20 tablet, Rfl: 0   QUEtiapine (SEROQUEL) 25 MG tablet, Take 25 mg by mouth 3 (three) times daily., Disp: , Rfl:    simethicone (MYLICON) 80 MG chewable tablet, Chew 1 tablet (80 mg total) by mouth 4 (four) times daily., Disp: 30 tablet, Rfl: 0   vitamin B-12 (CYANOCOBALAMIN) 500 MCG tablet, Take 1 tablet (500 mcg total) by mouth daily., Disp: 30 tablet, Rfl: 2   Allergies: Allergies  Allergen Reactions   Other Hives and Other (See Comments)    Cherry wood just cut- "smelled it and  broke out"; cannot tolerate ANY cherry fragrances, either      Cherry Hives   Wound Dressing Adhesive Rash and Other (See Comments)    Band-Aids = local reaction    REVIEW OF SYSTEMS:   Review of Systems  Constitutional:  Negative for chills, fatigue and fever.  HENT:   Negative for lump/mass, mouth sores, nosebleeds, sore throat and trouble swallowing.   Eyes:  Negative for eye problems.  Respiratory:  Negative for cough and shortness of breath.   Cardiovascular:  Negative for chest pain, leg swelling and palpitations.  Gastrointestinal:  Negative for abdominal pain, constipation, diarrhea, nausea and vomiting.  Genitourinary:  Negative for bladder incontinence, difficulty urinating, dysuria, frequency, hematuria and nocturia.   Musculoskeletal:  Negative for arthralgias, back pain, flank pain, myalgias and neck pain.  Skin:  Negative for itching and rash.  Neurological:  Negative for dizziness, headaches and numbness.  Hematological:  Does not bruise/bleed easily.  Psychiatric/Behavioral:  Negative for depression, sleep disturbance and suicidal ideas. The patient is not nervous/anxious.   All other systems reviewed and are negative.    VITALS:   There were no vitals taken for this visit.  Wt Readings from Last 3 Encounters:  05/05/22 125 lb 7.1 oz (56.9 kg)  05/04/22 125 lb 9.6 oz (57 kg)  04/28/22 129 lb (58.5 kg)    There is no height or weight on file to calculate BMI.  Performance status (ECOG): 1 - Symptomatic but completely ambulatory  PHYSICAL EXAM:   Physical Exam Vitals and nursing note reviewed. Exam conducted with a chaperone present.  Constitutional:      Appearance: Normal appearance.  Cardiovascular:     Rate and Rhythm: Normal rate and regular rhythm.     Pulses: Normal pulses.     Heart sounds: Normal heart sounds.  Pulmonary:     Effort: Pulmonary effort is normal.     Breath sounds: Normal breath sounds.  Abdominal:  Palpations: Abdomen is  soft. There is no hepatomegaly, splenomegaly or mass.     Tenderness: There is no abdominal tenderness.  Musculoskeletal:     Right lower leg: No edema.     Left lower leg: No edema.  Lymphadenopathy:     Cervical: No cervical adenopathy.     Right cervical: No superficial, deep or posterior cervical adenopathy.    Left cervical: No superficial, deep or posterior cervical adenopathy.     Upper Body:     Right upper body: No supraclavicular or axillary adenopathy.     Left upper body: No supraclavicular or axillary adenopathy.  Neurological:     General: No focal deficit present.     Mental Status: She is alert and oriented to person, place, and time.  Psychiatric:        Mood and Affect: Mood normal.        Behavior: Behavior normal.     LABS:      Latest Ref Rng & Units 05/06/2022    5:28 AM 2022/05/18   10:58 PM 05/04/2022    1:14 PM  CBC  WBC 4.0 - 10.5 K/uL 4.1  4.9  5.0   Hemoglobin 12.0 - 15.0 g/dL 9.8  16.1  09.6   Hematocrit 36.0 - 46.0 % 30.0  31.8  34.0   Platelets 150 - 400 K/uL 172  181  197       Latest Ref Rng & Units 05/09/2022    5:25 AM 05/08/2022    3:40 AM 05/07/2022    4:15 AM  CMP  Glucose 70 - 99 mg/dL 91  93  89   BUN 8 - 23 mg/dL 6  6  9    Creatinine 0.44 - 1.00 mg/dL 0.45  4.09  8.11   Sodium 135 - 145 mmol/L 142  137  138   Potassium 3.5 - 5.1 mmol/L 4.1  3.2  3.6   Chloride 98 - 111 mmol/L 107  107  106   CO2 22 - 32 mmol/L 29  27  28    Calcium 8.9 - 10.3 mg/dL 8.2  7.7  7.9      No results found for: "CEA1", "CEA" / No results found for: "CEA1", "CEA" No results found for: "PSA1" Lab Results  Component Value Date   BJY782 10 01/12/2022   No results found for: "CAN125"  No results found for: "TOTALPROTELP", "ALBUMINELP", "A1GS", "A2GS", "BETS", "BETA2SER", "GAMS", "MSPIKE", "SPEI" Lab Results  Component Value Date   TIBC 154 (L) 07/14/2021   FERRITIN 273 07/14/2021   IRONPCTSAT 11 07/14/2021   No results found for:  "LDH"   STUDIES:   DG Abd 1 View  Result Date: 05/06/2022 CLINICAL DATA:  Nausea and vomiting EXAM: ABDOMEN - 1 VIEW COMPARISON:  Previous studies including the CT done on 05/18/2022 FINDINGS: There is dilation of small bowel loops measuring up to 3.3 cm in diameter. There is prominence of mucosal folds in dilated small bowel loops. Gas and stool are present in the colon. Stomach is not distended. Degenerative changes and postsurgical changes are noted in the lumbar spine. There is contrast in the urinary bladder from previous CT. IMPRESSION: Lays mild dilation of small bowel loops suggesting ileus or partial obstruction. Electronically Signed   By: Ernie Avena M.D.   On: 05/06/2022 08:18   CT ABDOMEN PELVIS W CONTRAST  Result Date: 05/06/2022 CLINICAL DATA:  Abdominal pain, acute, nonlocalized. Vomiting. History of pancreatic cancer. EXAM: CT ABDOMEN AND PELVIS WITH CONTRAST  TECHNIQUE: Multidetector CT imaging of the abdomen and pelvis was performed using the standard protocol following bolus administration of intravenous contrast. RADIATION DOSE REDUCTION: This exam was performed according to the departmental dose-optimization program which includes automated exposure control, adjustment of the mA and/or kV according to patient size and/or use of iterative reconstruction technique. CONTRAST:  OMNIPAQUE IOHEXOL 300 MG/ML  SOLN COMPARISON:  04/28/2022 FINDINGS: Lower chest: Trace bilateral pleural effusion. Left base atelectasis. Hepatobiliary: Prior cholecystectomy. No biliary ductal dilatation or suspicious focal hepatic abnormality. Pancreas: Severe atrophy of the pancreatic body and tail with ductal dilatation. Appearance is stable since prior study. Spleen: Two separate spleens again noted. No focal abnormality. No change. Adrenals/Urinary Tract: Bilateral renal parapelvic cysts, stable. No hydronephrosis. Adrenal glands and urinary bladder unremarkable. Stomach/Bowel: Dilated small  bowel loops into the pelvis. Small bowel is fairly dilated to the ileocecal valve without significant caliber change. Moderate stool burden throughout the colon which is normal caliber. Stomach decompressed. Vascular/Lymphatic: Aortic atherosclerosis. No evidence of aneurysm or adenopathy. Reproductive: Uterus and adnexa unremarkable.  No mass. Other: Small amount of free fluid in the cul-de-sac, increased since prior study. Interstitial changes/stranding around the pancreatic head and 2/3 portions of the duodenum are stable since prior study and may be related to radiation changes. Musculoskeletal: Postoperative changes in the lumbar spine. Degenerative changes in the thoracolumbar spine. No acute bony abnormality. IMPRESSION: Dilated small bowel diffusely without significant caliber change in the distal small bowel. This could reflect ileus although the colon is not significantly dilated. Distal small bowel obstruction difficult to completely exclude. Small amount of free fluid in the pelvis. Trace bilateral pleural effusions. Stable pancreatic atrophy and ductal dilatation. Stable stranding/interstitial changes around the pancreatic head and adjacent duodenum. Aortic atherosclerosis. Electronically Signed   By: Charlett Nose M.D.   On: 05/06/2022 00:11   CT Abdomen Pelvis W Contrast  Result Date: 04/28/2022 CLINICAL DATA:  Abdominal pain.  History of pancreatic cancer. EXAM: CT ABDOMEN AND PELVIS WITH CONTRAST TECHNIQUE: Multidetector CT imaging of the abdomen and pelvis was performed using the standard protocol following bolus administration of intravenous contrast. RADIATION DOSE REDUCTION: This exam was performed according to the departmental dose-optimization program which includes automated exposure control, adjustment of the mA and/or kV according to patient size and/or use of iterative reconstruction technique. CONTRAST:  OMNIPAQUE IOHEXOL 300 MG/ML  SOLN COMPARISON:  02/08/2022 FINDINGS: Lower  chest: No acute pulmonary findings. No pleural effusion. The heart is normal in size. No pericardial effusion. Hepatobiliary: No hepatic lesions or intrahepatic biliary dilatation. The gallbladder is not identified and may be surgically absent or completely collapsed. Mild common bile duct dilatation. Pancreas: Severe atrophy of the pancreatic body and tail with marked pancreatic ductal dilatation with an abrupt cut off at the body head junction region without measurable tumor. Spleen: 2 separate spleens are noted.  No splenic lesions. Adrenals/Urinary Tract: Adrenal glands and kidneys are unremarkable and stable. Stable bilateral parapelvic renal cysts not requiring any further imaging evaluation or follow-up. The bladder is unremarkable. Moderate distension. Stomach/Bowel: Gastric wall thickening and surrounding interstitial changes could be due to gastritis or peptic ulcer disease or possibly secondary to radiation change. Interstitial changes surround the pancreatic head, second and third portions of the duodenum and the mesenteric vessels likely radiation related or inflammatory process. Vascular/Lymphatic: Stable aortic and branch vessel calcifications but no aneurysm or dissection. The major venous structures are patent. No mesenteric or retroperitoneal lymphadenopathy. Reproductive: The uterus and ovaries are unremarkable. Other:  Small amount of free pelvic fluid. Musculoskeletal: No significant bony findings. 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 No worrisome bone lesions. IMPRESSION: 1. 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 2. Interstitial changes surround the pancreatic head, second and third portions of the duodenum and the mesenteric vessels likely radiation related or inflammatory process. 3. Gastric wall thickening and  surrounding interstitial changes could be due to gastritis or peptic ulcer disease or possibly secondary to radiation change. 4. Small amount of free pelvic fluid. 5. Aortic atherosclerosis. Aortic Atherosclerosis (ICD10-I70.0). Electronically Signed   By: Rudie Meyer M.D.   On: 04/28/2022 14:43

## 2022-05-11 ENCOUNTER — Inpatient Hospital Stay (HOSPITAL_BASED_OUTPATIENT_CLINIC_OR_DEPARTMENT_OTHER): Payer: Medicare Other | Admitting: Hematology

## 2022-05-11 ENCOUNTER — Inpatient Hospital Stay: Payer: Medicare Other

## 2022-05-11 VITALS — BP 106/69 | HR 100 | Temp 97.6°F | Resp 20 | Wt 127.6 lb

## 2022-05-11 DIAGNOSIS — C259 Malignant neoplasm of pancreas, unspecified: Secondary | ICD-10-CM | POA: Diagnosis not present

## 2022-05-11 DIAGNOSIS — C250 Malignant neoplasm of head of pancreas: Secondary | ICD-10-CM

## 2022-05-11 DIAGNOSIS — Z95828 Presence of other vascular implants and grafts: Secondary | ICD-10-CM

## 2022-05-11 DIAGNOSIS — C253 Malignant neoplasm of pancreatic duct: Secondary | ICD-10-CM | POA: Diagnosis not present

## 2022-05-11 LAB — COMPREHENSIVE METABOLIC PANEL
ALT: 16 U/L (ref 0–44)
AST: 24 U/L (ref 15–41)
Albumin: 2.7 g/dL — ABNORMAL LOW (ref 3.5–5.0)
Alkaline Phosphatase: 73 U/L (ref 38–126)
Anion gap: 7 (ref 5–15)
BUN: 14 mg/dL (ref 8–23)
CO2: 28 mmol/L (ref 22–32)
Calcium: 8.3 mg/dL — ABNORMAL LOW (ref 8.9–10.3)
Chloride: 103 mmol/L (ref 98–111)
Creatinine, Ser: 0.76 mg/dL (ref 0.44–1.00)
GFR, Estimated: >60 mL/min (ref >60)
Glucose, Bld: 133 mg/dL — ABNORMAL HIGH (ref 70–99)
Potassium: 3.6 mmol/L (ref 3.5–5.1)
Sodium: 138 mmol/L (ref 135–145)
Total Bilirubin: <0.1 mg/dL — ABNORMAL LOW (ref 0.3–1.2)
Total Protein: 5.5 g/dL — ABNORMAL LOW (ref 6.5–8.1)

## 2022-05-11 LAB — CBC WITH DIFFERENTIAL/PLATELET
Abs Immature Granulocytes: 0.06 10*3/uL (ref 0.00–0.07)
Basophils Absolute: 0 10*3/uL (ref 0.0–0.1)
Basophils Relative: 0 %
Eosinophils Absolute: 0.1 10*3/uL (ref 0.0–0.5)
Eosinophils Relative: 3 %
HCT: 30.1 % — ABNORMAL LOW (ref 36.0–46.0)
Hemoglobin: 9.6 g/dL — ABNORMAL LOW (ref 12.0–15.0)
Immature Granulocytes: 2 %
Lymphocytes Relative: 5 %
Lymphs Abs: 0.2 10*3/uL — ABNORMAL LOW (ref 0.7–4.0)
MCH: 32.3 pg (ref 26.0–34.0)
MCHC: 31.9 g/dL (ref 30.0–36.0)
MCV: 101.3 fL — ABNORMAL HIGH (ref 80.0–100.0)
Monocytes Absolute: 0.4 10*3/uL (ref 0.1–1.0)
Monocytes Relative: 12 %
Neutro Abs: 2.9 10*3/uL (ref 1.7–7.7)
Neutrophils Relative %: 78 %
Platelets: 182 10*3/uL (ref 150–400)
RBC: 2.97 MIL/uL — ABNORMAL LOW (ref 3.87–5.11)
RDW: 18.1 % — ABNORMAL HIGH (ref 11.5–15.5)
WBC: 3.7 10*3/uL — ABNORMAL LOW (ref 4.0–10.5)
nRBC: 0 % (ref 0.0–0.2)

## 2022-05-11 LAB — MAGNESIUM: Magnesium: 1.9 mg/dL (ref 1.7–2.4)

## 2022-05-11 MED ORDER — HEPARIN SOD (PORK) LOCK FLUSH 100 UNIT/ML IV SOLN
500.0000 [IU] | Freq: Once | INTRAVENOUS | Status: AC
  Administered 2022-05-11: 500 [IU] via INTRAVENOUS

## 2022-05-11 MED ORDER — SODIUM CHLORIDE 0.9% FLUSH
10.0000 mL | Freq: Once | INTRAVENOUS | Status: AC
  Administered 2022-05-11: 10 mL via INTRAVENOUS

## 2022-05-11 NOTE — Patient Instructions (Addendum)
[facility] at [facility] Discharge Instructions   You were seen and examined today by [name].  He reviewed the results of your lab work which are mostly normal/stable.   You may start back on the Xeloda - take 2 pills twice a day on the days of radiation. Once you are done with radiation you may stop the Xeloda pills.   Return as scheduled next week.    Thank you for choosing [facility] at [facility] to provide your oncology and hematology care.  To afford each patient quality time with our provider, please arrive at least 15 minutes before your scheduled appointment time.   If you have a lab appointment with the Cancer Center please come in thru the Main Entrance and check in at the main information desk.  You need to re-schedule your appointment should you arrive 10 or more minutes late.  We strive to give you quality time with our providers, and arriving late affects you and other patients whose appointments are after yours.  Also, if you no show three or more times for appointments you may be dismissed from the clinic at the providers discretion.     Again, thank you for choosing [facility].  Our hope is that these requests will decrease the amount of time that you wait before being seen by our physicians.       _____________________________________________________________  Should you have questions after your visit to [facility], please contact our office at [phone] and follow the prompts.  Our office hours are 8:00 a.m. and 4:30 p.m. Monday - Friday.  Please note that voicemails left after 4:00 p.m. may not be returned until the following business day.  We are closed weekends and major holidays.  You do have access to a nurse 24-7, just call the main number to the clinic [phone] and do not press any options, hold on the line and a nurse will answer the phone.    For prescription refill  requests, have your pharmacy contact our office and allow 72 hours.    Due to Covid, you will need to wear a mask upon entering the hospital. If you do not have a mask, a mask will be given to you at the Main Entrance upon arrival. For doctor visits, patients may have 1 support person age 41 or older with them. For treatment visits, patients can not have anyone with them due to social distancing guidelines and our immunocompromised population.

## 2022-05-15 ENCOUNTER — Other Ambulatory Visit: Payer: Self-pay

## 2022-05-17 NOTE — Progress Notes (Signed)
[facility] [address] [place] [address]    Clinic Day:  05/18/2022  Referring physician: [name], DO  Patient Care Team: [name], DO as PCP - General (Family Medicine) Jena Gauss, Gerrit Friends, MD as Consulting Physician (Gastroenterology) Doreatha Massed, MD as Medical Oncologist (Medical Oncology) Therese Sarah, RN as Oncology Nurse Navigator (Medical Oncology)   ASSESSMENT & PLAN:   Assessment: 1. 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 - EGD/EUS (07/09/2021) by Dr. Meridee Score: The region where the PD is strictured, darker appearance was identified.  There was no clear hypoechoic mass.  Endosonographic appearance suggestive of either chronic pancreatitis changes versus possible malignancy.  Endosonographic staging T1 N0 MX.  Pancreatic parenchymal abnormalities consisting of atrophy and hyperechoic strands.  No sign of significant pathology in the CBD.  No malignant appearing lymph nodes in the celiac region, peripancreatic region and porta hepatic region - Pathology (pancreatic duct FNA): Malignant cells consistent with adenocarcinoma - CA 19-9: 31 (0-35) - CTAP (07/13/2021): Fluid collection extending posterior to the gastric antrum and ventral to the pancreas.  Elongated collection extends to the greater curvature of the stomach with inflammation surrounding the collection suggestive of a pancreatic pseudocyst.  Chronic dilatation of pancreatic duct with abrupt termination of the duct dilatation in the head of the pancreas. - CT chest (07/16/2021): No evidence of metastatic disease in the chest. - 8 to 9 pound weight loss in the last 3 months, decreased appetite. - PET scan (07/30/2021): Faint focus of increased metabolic activity, SUV 3.6, measuring 9 mm at the junction of the pancreatic head and body, suspicious for pancreatic  adenocarcinoma.  No obvious extrapancreatic spread of tumor.  Cystic lesion along the posterior margin of the stomach and anterior margin of the pancreatic head favored pseudocyst.  Small amount of pelvic ascites. - MRI of the abdomen pancreatic protocol (08/03/2021): Similar appearance of the pancreas, with abrupt transition from dilated to decompressed duct in the pancreatic head with no evidence of underlying mass.  Pseudocyst enlarged position between pancreatic head and GE junction with significant mass effect upon the pyloric region and gastric outlet obstruction radiologically.  No evidence of abdominal metastatic disease. - FOLFOX on 09/16/2021, FOLFIRINOX cycle 2 on 09/30/2021 - 02/15/2022: Staging laparoscopy, exploratory laparotomy.  Whipple's aborted due to extensive thick scarring with obliteration of tissue planes preventing safe dissection of SMV. - XRT with Xeloda started on 04/05/2022, Xeloda dose reduced to 1000 mg twice daily on 04/20/2022 due to grade 1 HFSR, XRT completed on 05/18/2022   2. Social/family history: - She is accompanied by her son [name] today.  She lives at home by herself.  She is independent of ADLs and IADLs.  She did office work prior to retirement.  Non-smoker and nonalcoholic. - Brother (alcoholic) and maternal half uncle had pancreatitis.  No history of malignancy.  3. Pulmonary embolism: - She was diagnosed with pulmonary embolism in January 2023 and then will hospital.  There does not appear to be any provoking factors for PE.  Reportedly Doppler was negative for DVT.  She was treated with Eliquis for 60 days, discontinued after that.    Plan: 1. Stage I (T1 N0 MX) pancreatic adenocarcinoma: - Hospitalization from 05/07/2022 through 05/09/2022 with diarrhea and vomiting. - CTAP on 05/15/2022: Dilated small bowel suggestive of small bowel obstruction. - This morning she has been feeling very weak.  She also had to take Zofran as she felt nauseous. - She completed  XRT today. - Labs today: Normal LFTs and creatinine.  Lites normal.  White count is 2.8 with ANC of 1.9.  Platelets are normal.  Hemoglobin is 10.4. - She will take her last dose of Xeloda this evening. - Recommend follow-up in 5 weeks with repeat CT of the abdomen and pelvis with pancreatic protocol and CT of the chest along with CA 19-9 levels.   2.  Weight loss: - She lost about 4 pounds since last week.  I have encouraged her to drink boost +2 to 3 cans/day along with eating solid foods.   3.  Grade 1 HFSR: - She has erythema in the bottom of the feet with skin peeling right more than left. - Continue betamethasone dipropionate 0.05% twice daily.  Orders Placed This Encounter  Procedures   CT Abdomen Pelvis W Wo Contrast    Pancreatic protocol    Standing Status:   Future    Standing Expiration Date:   05/18/2023    Order Specific Question:   If indicated for the ordered procedure, I authorize the administration of contrast media per Radiology protocol    Answer:   Yes    Order Specific Question:   Does the patient have a contrast media/X-ray dye allergy?    Answer:   No    Order Specific Question:   Preferred imaging location?    Answer:   [facility]    Order Specific Question:   Release to patient    Answer:   Immediate [1]    Order Specific Question:   If indicated for the ordered procedure, I authorize the administration of oral contrast media per Radiology protocol    Answer:   Yes   CT Chest W Contrast    Standing Status:   Future    Standing Expiration Date:   05/18/2023    Order Specific Question:   If indicated for the ordered procedure, I authorize the administration of contrast media per Radiology protocol    Answer:   Yes    Order Specific Question:   Does the patient have a contrast media/X-ray dye allergy?    Answer:   No    Order Specific Question:   Preferred imaging location?    Answer:   [facility]    Order Specific Question:   Release to  patient    Answer:   Immediate [1]   CBC with Differential/Platelet    Standing Status:   Future    Standing Expiration Date:   05/18/2023    Order Specific Question:   Release to patient    Answer:   Immediate   Comprehensive metabolic panel    Standing Status:   Future    Standing Expiration Date:   05/18/2023    Order Specific Question:   Release to patient    Answer:   Immediate   Cancer antigen 19-9    Standing Status:   Future    Standing Expiration Date:   05/18/2023      I,Katie Daubenspeck,acting as a scribe for Doreatha Massed, MD.,have documented all relevant documentation on the behalf of Doreatha Massed, MD,as directed by  Doreatha Massed, MD while in the presence of Doreatha Massed, MD.   I, Doreatha Massed MD, have reviewed the above documentation for accuracy and completeness, and I agree with the above.   Doreatha Massed, MD   4/23/20243:47 PM  CHIEF COMPLAINT:  Diagnosis: pancreatic cancer    Cancer Staging  Malignant neoplasm of pancreas Staging form: Exocrine Pancreas, AJCC 8th Edition - Clinical stage from 07/29/2021: Stage IA (cT1, cN0, cM0) - Unsigned    Prior Therapy: FOLFIRINOX   Current Therapy:  Xeloda and radiation    HISTORY OF PRESENT ILLNESS:   Oncology History  Malignant neoplasm of pancreas  07/21/2021 Initial Diagnosis   Malignant neoplasm of pancreas (HCC)   09/16/2021 -  Chemotherapy   Patient is on Treatment Plan : PANCREAS Modified FOLFIRINOX q14d x 4 cycles      Genetic Testing   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 VUS in ATM called c.4367G>A and in [facility] called c.238G>A also identified. The remainder of testing was negative/normal. The report date is 09/20/2021.  The CancerNext-Expanded + RNAinsight gene  panel offered by [employer] and includes sequencing and rearrangement analysis for the following 77 genes: IP, ALK, APC*, ATM*, AXIN2, BAP1, BARD1, BLM, BMPR1A, BRCA1*, BRCA2*, BRIP1*, CDC73, CDH1*,CDK4, CDKN1B, CDKN2A, CHEK2*, CTNNA1, DICER1, FANCC, FH, FLCN, GALNT12, KIF1B, LZTR1, MAX, MEN1, MET, MLH1*, MSH2*, MSH3, MSH6*, MUTYH*, NBN, NF1*, NF2, NTHL1, PALB2*, PHOX2B, PMS2*, POT1, PRKAR1A, PTCH1, PTEN*, RAD51C*, RAD51D*,RB1, RECQL, RET, SDHA, SDHAF2, SDHB, SDHC, SDHD, SMAD4, SMARCA4, SMARCB1, SMARCE1, STK11, SUFU, TMEM127, TP53*,TSC1, TSC2, VHL and XRCC2 (sequencing and deletion/duplication); EGFR, EGLN1, HOXB13, KIT, MITF, PDGFRA, POLD1 and POLE (sequencing only); EPCAM and GREM1 (deletion/duplication only).      INTERVAL HISTORY:   Sheryl Bender is a 77 y.o. female presenting to clinic today for follow up of pancreatic cancer. She was last seen by me on 05/11/22.  Today, she states that she is doing well overall. Her appetite level is at 50%. Her energy level is at 10%.  PAST MEDICAL HISTORY:   Past Medical History: Past Medical History:  Diagnosis Date   Anxiety    Arthritis    Depression    Dysrhythmia    hx palpitations greater than 5 yrs -neg echo, stress per pt ? where or dr   GERD (gastroesophageal reflux disease)    occ   Nausea & vomiting 08/24/2021   Pancreatic adenocarcinoma    Pancreatic pseudocyst    Port-A-Cath in place 09/15/2021   Pulmonary embolism    September 2022    Surgical History: Past Surgical History:  Procedure Laterality Date   ANTERIOR LAT LUMBAR FUSION Right 12/16/2015   Procedure: RIGHT LUMBAR TWO-THREE, LUMBAR THREE-FOUR, LUMBAR FOUR-FIVE ANTEROLATERAL LUMBAR INTERBODY FUSION;  Surgeon: Maeola Harman, MD;  Location: [facility] OR;  Service: Neurosurgery;  Laterality: Right;   BALLOON DILATION N/A 08/27/2021   Procedure: BALLOON DILATION;  Surgeon: Meridee Score Netty Starring., MD;  Location: [facility] ENDOSCOPY;  Service: Gastroenterology;  Laterality: N/A;   BIOPSY   07/09/2021   Procedure: BIOPSY;  Surgeon: Meridee Score Netty Starring., MD;  Location: [facility] ENDOSCOPY;  Service: Gastroenterology;;   CYST GASTROSTOMY  08/27/2021   Procedure: CYST GASTROSTOMY;  Surgeon: Lemar Lofty., MD;  Location: [facility] ENDOSCOPY;  Service: Gastroenterology;;   ESOPHAGOGASTRODUODENOSCOPY N/A 07/09/2021   Procedure: ESOPHAGOGASTRODUODENOSCOPY (EGD);  Surgeon: Lemar Lofty., MD;  Location: [facility] ENDOSCOPY;  Service: Gastroenterology;  Laterality: N/A;   ESOPHAGOGASTRODUODENOSCOPY (EGD) WITH PROPOFOL N/A 08/27/2021   Procedure: ESOPHAGOGASTRODUODENOSCOPY (EGD) WITH PROPOFOL;  Surgeon: Meridee Score Netty Starring., MD;  Location: [facility] ENDOSCOPY;  Service: Gastroenterology;  Laterality: N/A;   ESOPHAGOGASTRODUODENOSCOPY (EGD) WITH PROPOFOL N/A 10/08/2021  Procedure: ESOPHAGOGASTRODUODENOSCOPY (EGD) WITH PROPOFOL;  Surgeon: Meridee Score Netty Starring., MD;  Location: [facility] ENDOSCOPY;  Service: Gastroenterology;  Laterality: N/A;   EUS N/A 07/09/2021   Procedure: UPPER ENDOSCOPIC ULTRASOUND (EUS) RADIAL;  Surgeon: Lemar Lofty., MD;  Location: [facility] ENDOSCOPY;  Service: Gastroenterology;  Laterality: N/A;   EUS N/A 08/27/2021   Procedure: UPPER ENDOSCOPIC ULTRASOUND (EUS) LINEAR;  Surgeon: Lemar Lofty., MD;  Location: [facility] ENDOSCOPY;  Service: Gastroenterology;  Laterality: N/A;   FINE NEEDLE ASPIRATION  07/09/2021   Procedure: FINE NEEDLE ASPIRATION (FNA) LINEAR;  Surgeon: Lemar Lofty., MD;  Location: [facility] ENDOSCOPY;  Service: Gastroenterology;;   IR IMAGING GUIDED PORT INSERTION  09/14/2021   LAPAROSCOPY N/A 02/15/2022   Procedure: STAGING DIAGNOSTIC;  Surgeon: Fritzi Mandes, MD;  Location: [facility] OR;  Service: General;  Laterality: N/A;   LUMBAR PERCUTANEOUS PEDICLE SCREW 3 LEVEL Bilateral 12/16/2015   Procedure: PERCUTANEOUS PEDICLE SCREWS BILATERALLY AT LUMBAR TWO-FIVE;  Surgeon: Maeola Harman, MD;  Location: [facility] OR;  Service: Neurosurgery;  Laterality: Bilateral;    PANCREATIC STENT PLACEMENT  08/27/2021   Procedure: PANCREATIC STENT PLACEMENT;  Surgeon: Lemar Lofty., MD;  Location: [facility] ENDOSCOPY;  Service: Gastroenterology;;   Francine Graven REMOVAL  10/08/2021   Procedure: STENT REMOVAL;  Surgeon: Lemar Lofty., MD;  Location: [facility] ENDOSCOPY;  Service: Gastroenterology;;   TUBAL LIGATION  1974   WHIPPLE PROCEDURE N/A 02/15/2022   Procedure: ATTEMPTED WHIPPLE PROCEDURE;  Surgeon: Fritzi Mandes, MD;  Location: [facility] OR;  Service: General;  Laterality: N/A;    Social History: Social History   Socioeconomic History   Marital status: Widowed    Spouse name: Not on file   Number of children: Not on file   Years of education: Not on file   Highest education level: Not on file  Occupational History   Not on file  Tobacco Use   Smoking status: Never   Smokeless tobacco: Never  Vaping Use   Vaping Use: Never used  Substance and Sexual Activity   Alcohol use: No   Drug use: Never   Sexual activity: Not on file  Other Topics Concern   Not on file  Social History Narrative   Not on file   Social Determinants of Health   Financial Resource Strain: Not on file  Food Insecurity: Not on file  Transportation Needs: Not on file  Physical Activity: Not on file  Stress: Not on file  Social Connections: Not on file  Intimate Partner Violence: Not on file    Family History: Family History  Problem Relation Age of Onset   Pancreatitis Brother        alcoholic pancreatitis   Pancreatic cancer Neg Hx    Colon cancer Neg Hx     Current Medications:  Current Outpatient Medications:    acetaminophen (TYLENOL) 500 MG tablet, Take 2 tablets (1,000 mg total) by mouth every 8 (eight) hours as needed for moderate pain, headache or fever. (Patient taking differently: Take 750-1,000 mg by mouth every 8 (eight) hours as needed for moderate pain, headache or fever.), Disp: , Rfl:    Aromatic Inhalants (VICKS VAPOINHALER) INHA, Inhale 1 puff into the  lungs daily as needed (congestion)., Disp: , Rfl:    betamethasone dipropionate 0.05 % cream, Apply topically 2 (two) times daily., Disp: 30 g, Rfl: 3   buPROPion (WELLBUTRIN XL) 300 MG 24 hr tablet, Take 300 mg by mouth daily after breakfast. Takes differently from prescribed: 20mg  BID, Disp: , Rfl:  busPIRone (BUSPAR) 10 MG tablet, Take 20 mg by mouth 3 (three) times daily., Disp: , Rfl:    calcium carbonate (TUMS - DOSED IN MG ELEMENTAL CALCIUM) 500 MG chewable tablet, Chew 1 tablet (200 mg of elemental calcium total) by mouth 2 (two) times daily as needed for indigestion or heartburn. (Patient taking differently: Chew 1-2 tablets by mouth 2 (two) times daily as needed for indigestion or heartburn.), Disp: , Rfl:    capecitabine (XELODA) 500 MG tablet, Take 1,000 mg/m2 by mouth 2 (two) times daily after a meal., Disp: , Rfl:    Cholecalciferol (VITAMIN D3) 125 MCG (5000 UT) CAPS, Take 5,000 Units by mouth every other day., Disp: , Rfl:    escitalopram (LEXAPRO) 20 MG tablet, Take 20 mg by mouth at bedtime., Disp: , Rfl:    fiber (NUTRISOURCE FIBER) PACK packet, Take 1 packet by mouth 2 (two) times daily., Disp: 60 packet, Rfl: 0   fluorouracil CALGB 09811 1,920 mg/m2 in sodium chloride 0.9 % 150 mL, Inject 1,920 mg/m2 into the vein over 48 hr., Disp: , Rfl:    lactose free nutrition (BOOST) LIQD, Take 237 mLs by mouth 2 (two) times daily between meals., Disp: , Rfl:    lidocaine-prilocaine (EMLA) cream, Apply a small amount to port a cath site (do not rub in) and cover with plastic wrap 1 hour prior to infusion appointments, Disp: 30 g, Rfl: 3   linaclotide (LINZESS) 72 MCG capsule, Take 1 capsule (72 mcg total) by mouth daily before breakfast., Disp: 30 capsule, Rfl: 12   methocarbamol (ROBAXIN) 500 MG tablet, Take 1 tablet (500 mg total) by mouth every 8 (eight) hours as needed for muscle spasms., Disp: 15 tablet, Rfl: 0   metoprolol succinate (TOPROL-XL) 25 MG 24 hr tablet, Take 0.5 tablets  (12.5 mg total) by mouth daily after breakfast., Disp: 14 tablet, Rfl: 0   mirtazapine (REMERON) 30 MG tablet, Take 30 mg by mouth at bedtime., Disp: , Rfl:    omeprazole (PRILOSEC) 40 MG capsule, Take 1 capsule (40 mg total) by mouth daily., Disp: 30 capsule, Rfl: 12   ondansetron (ZOFRAN-ODT) 8 MG disintegrating tablet, Take 1 tablet (8 mg total) by mouth every 8 (eight) hours as needed for nausea or vomiting., Disp: 30 tablet, Rfl: 0   Polyethyl Glycol-Propyl Glycol (SYSTANE OP), Place 1 drop into both eyes at bedtime., Disp: , Rfl:    potassium chloride SA (KLOR-CON M) 20 MEQ tablet, One po bid x 3 days, then one po once a day (Patient taking differently: Take 20 mEq by mouth daily. One po bid x 3 days, then one po once a day), Disp: 20 tablet, Rfl: 0   QUEtiapine (SEROQUEL) 25 MG tablet, Take 25 mg by mouth 3 (three) times daily., Disp: , Rfl:    simethicone (MYLICON) 80 MG chewable tablet, Chew 1 tablet (80 mg total) by mouth 4 (four) times daily., Disp: 30 tablet, Rfl: 0   vitamin B-12 (CYANOCOBALAMIN) 500 MCG tablet, Take 1 tablet (500 mcg total) by mouth daily., Disp: 30 tablet, Rfl: 2   Allergies: Allergies  Allergen Reactions   Other Hives and Other (See Comments)    Cherry wood just cut- "smelled it and broke out"; cannot tolerate ANY cherry fragrances, either      Cherry Hives   Wound Dressing Adhesive Rash and Other (See Comments)    Band-Aids = local reaction    REVIEW OF SYSTEMS:   Review of Systems  Constitutional:  Positive for fatigue.  Negative for chills and fever.  HENT:   Negative for lump/mass, mouth sores, nosebleeds, sore throat and trouble swallowing.   Eyes:  Negative for eye problems.  Respiratory:  Positive for shortness of breath. Negative for cough.   Cardiovascular:  Negative for chest pain, leg swelling and palpitations.  Gastrointestinal:  Positive for nausea. Negative for abdominal pain, constipation, diarrhea and vomiting.  Genitourinary:  Negative  for bladder incontinence, difficulty urinating, dysuria, frequency, hematuria and nocturia.   Musculoskeletal:  Negative for arthralgias, back pain, flank pain, myalgias and neck pain.  Skin:  Negative for itching and rash.  Neurological:  Positive for dizziness and numbness. Negative for headaches.  Hematological:  Does not bruise/bleed easily.  Psychiatric/Behavioral:  Negative for depression, sleep disturbance and suicidal ideas. The patient is not nervous/anxious.   All other systems reviewed and are negative.    VITALS:   Blood pressure 108/62, pulse 87, temperature 98.2 F (36.8 C), temperature source Oral, resp. rate 18, weight 121 lb 3.2 oz (55 kg), SpO2 97 %.  Wt Readings from Last 3 Encounters:  05/18/22 121 lb 3.2 oz (55 kg)  05/18/22 121 lb 3.2 oz (55 kg)  05/11/22 127 lb 9.6 oz (57.9 kg)    Body mass index is 21.47 kg/m.  Performance status (ECOG): 1 - Symptomatic but completely ambulatory  PHYSICAL EXAM:   Physical Exam Vitals and nursing note reviewed. Exam conducted with a chaperone present.  Constitutional:      Appearance: Normal appearance.  Cardiovascular:     Rate and Rhythm: Normal rate and regular rhythm.     Pulses: Normal pulses.     Heart sounds: Normal heart sounds.  Pulmonary:     Effort: Pulmonary effort is normal.     Breath sounds: Normal breath sounds.  Abdominal:     Palpations: Abdomen is soft. There is no hepatomegaly, splenomegaly or mass.     Tenderness: There is no abdominal tenderness.  Musculoskeletal:     Right lower leg: No edema.     Left lower leg: No edema.  Lymphadenopathy:     Cervical: No cervical adenopathy.     Right cervical: No superficial, deep or posterior cervical adenopathy.    Left cervical: No superficial, deep or posterior cervical adenopathy.     Upper Body:     Right upper body: No supraclavicular or axillary adenopathy.     Left upper body: No supraclavicular or axillary adenopathy.  Neurological:      General: No focal deficit present.     Mental Status: She is alert and oriented to person, place, and time.  Psychiatric:        Mood and Affect: Mood normal.        Behavior: Behavior normal.    LABS:      Latest Ref Rng & Units 05/18/2022    2:36 PM 05/11/2022   12:59 PM 05/06/2022    5:28 AM  CBC  WBC 4.0 - 10.5 K/uL 2.8  3.7  4.1   Hemoglobin 12.0 - 15.0 g/dL 09.8  9.6  9.8   Hematocrit 36.0 - 46.0 % 31.7  30.1  30.0   Platelets 150 - 400 K/uL 173  182  172       Latest Ref Rng & Units 05/18/2022    2:36 PM 05/11/2022   12:59 PM 05/09/2022    5:25 AM  CMP  Glucose 70 - 99 mg/dL 119  147  91   BUN 8 - 23 mg/dL 25  14  6   Creatinine 0.44 - 1.00 mg/dL 1.61  0.96  0.45   Sodium 135 - 145 mmol/L 136  138  142   Potassium 3.5 - 5.1 mmol/L 4.0  3.6  4.1   Chloride 98 - 111 mmol/L 104  103  107   CO2 22 - 32 mmol/L 25  28  29    Calcium 8.9 - 10.3 mg/dL 8.9  8.3  8.2   Total Protein 6.5 - 8.1 g/dL 6.3  5.5    Total Bilirubin 0.3 - 1.2 mg/dL 0.6  <4.0    Alkaline Phos 38 - 126 U/L 95  73    AST 15 - 41 U/L 32  24    ALT 0 - 44 U/L 23  16       No results found for: "CEA1", "CEA" / No results found for: "CEA1", "CEA" No results found for: "PSA1" Lab Results  Component Value Date   JWJ191 10 01/12/2022   No results found for: "CAN125"  No results found for: "TOTALPROTELP", "ALBUMINELP", "A1GS", "A2GS", "BETS", "BETA2SER", "GAMS", "MSPIKE", "SPEI" Lab Results  Component Value Date   TIBC 154 (L) 07/14/2021   FERRITIN 273 07/14/2021   IRONPCTSAT 11 07/14/2021   No results found for: "LDH"   STUDIES:   DG Abd 1 View  Result Date: 05/06/2022 CLINICAL DATA:  Nausea and vomiting EXAM: ABDOMEN - 1 VIEW COMPARISON:  Previous studies including the CT done on 05-21-22 FINDINGS: There is dilation of small bowel loops measuring up to 3.3 cm in diameter. There is prominence of mucosal folds in dilated small bowel loops. Gas and stool are present in the colon. Stomach is not  distended. Degenerative changes and postsurgical changes are noted in the lumbar spine. There is contrast in the urinary bladder from previous CT. IMPRESSION: Lays mild dilation of small bowel loops suggesting ileus or partial obstruction. Electronically Signed   By: Ernie Avena M.D.   On: 05/06/2022 08:18   CT ABDOMEN PELVIS W CONTRAST  Result Date: 05/06/2022 CLINICAL DATA:  Abdominal pain, acute, nonlocalized. Vomiting. History of pancreatic cancer. EXAM: CT ABDOMEN AND PELVIS WITH CONTRAST TECHNIQUE: Multidetector CT imaging of the abdomen and pelvis was performed using the standard protocol following bolus administration of intravenous contrast. RADIATION DOSE REDUCTION: This exam was performed according to the departmental dose-optimization program which includes automated exposure control, adjustment of the mA and/or kV according to patient size and/or use of iterative reconstruction technique. CONTRAST:  OMNIPAQUE IOHEXOL 300 MG/ML  SOLN COMPARISON:  04/28/2022 FINDINGS: Lower chest: Trace bilateral pleural effusion. Left base atelectasis. Hepatobiliary: Prior cholecystectomy. No biliary ductal dilatation or suspicious focal hepatic abnormality. Pancreas: Severe atrophy of the pancreatic body and tail with ductal dilatation. Appearance is stable since prior study. Spleen: Two separate spleens again noted. No focal abnormality. No change. Adrenals/Urinary Tract: Bilateral renal parapelvic cysts, stable. No hydronephrosis. Adrenal glands and urinary bladder unremarkable. Stomach/Bowel: Dilated small bowel loops into the pelvis. Small bowel is fairly dilated to the ileocecal valve without significant caliber change. Moderate stool burden throughout the colon which is normal caliber. Stomach decompressed. Vascular/Lymphatic: Aortic atherosclerosis. No evidence of aneurysm or adenopathy. Reproductive: Uterus and adnexa unremarkable.  No mass. Other: Small amount of free fluid in the cul-de-sac,  increased since prior study. Interstitial changes/stranding around the pancreatic head and 2/3 portions of the duodenum are stable since prior study and may be related to radiation changes. Musculoskeletal: Postoperative changes in the lumbar spine. Degenerative changes in the  thoracolumbar spine. No acute bony abnormality. IMPRESSION: Dilated small bowel diffusely without significant caliber change in the distal small bowel. This could reflect ileus although the colon is not significantly dilated. Distal small bowel obstruction difficult to completely exclude. Small amount of free fluid in the pelvis. Trace bilateral pleural effusions. Stable pancreatic atrophy and ductal dilatation. Stable stranding/interstitial changes around the pancreatic head and adjacent duodenum. Aortic atherosclerosis. Electronically Signed   By: Charlett Nose M.D.   On: 05/06/2022 00:11   CT Abdomen Pelvis W Contrast  Result Date: 04/28/2022 CLINICAL DATA:  Abdominal pain.  History of pancreatic cancer. EXAM: CT ABDOMEN AND PELVIS WITH CONTRAST TECHNIQUE: Multidetector CT imaging of the abdomen and pelvis was performed using the standard protocol following bolus administration of intravenous contrast. RADIATION DOSE REDUCTION: This exam was performed according to the departmental dose-optimization program which includes automated exposure control, adjustment of the mA and/or kV according to patient size and/or use of iterative reconstruction technique. CONTRAST:  OMNIPAQUE IOHEXOL 300 MG/ML  SOLN COMPARISON:  02/08/2022 FINDINGS: Lower chest: No acute pulmonary findings. No pleural effusion. The heart is normal in size. No pericardial effusion. Hepatobiliary: No hepatic lesions or intrahepatic biliary dilatation. The gallbladder is not identified and may be surgically absent or completely collapsed. Mild common bile duct dilatation. Pancreas: Severe atrophy of the pancreatic body and tail with marked pancreatic ductal dilatation  with an abrupt cut off at the body head junction region without measurable tumor. Spleen: 2 separate spleens are noted.  No splenic lesions. Adrenals/Urinary Tract: Adrenal glands and kidneys are unremarkable and stable. Stable bilateral parapelvic renal cysts not requiring any further imaging evaluation or follow-up. The bladder is unremarkable. Moderate distension. Stomach/Bowel: Gastric wall thickening and surrounding interstitial changes could be due to gastritis or peptic ulcer disease or possibly secondary to radiation change. Interstitial changes surround the pancreatic head, second and third portions of the duodenum and the mesenteric vessels likely radiation related or inflammatory process. Vascular/Lymphatic: Stable aortic and branch vessel calcifications but no aneurysm or dissection. The major venous structures are patent. No mesenteric or retroperitoneal lymphadenopathy. Reproductive: The uterus and ovaries are unremarkable. Other: Small amount of free pelvic fluid. Musculoskeletal: No significant bony findings. 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 No worrisome bone lesions. IMPRESSION: 1. 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 2. Interstitial changes surround the pancreatic head, second and third portions of the duodenum and the mesenteric vessels likely radiation related or inflammatory process. 3. Gastric wall thickening and surrounding interstitial changes could be due to gastritis or peptic ulcer disease or possibly secondary to radiation change. 4. Small amount of free pelvic fluid. 5. Aortic atherosclerosis. Aortic Atherosclerosis (ICD10-I70.0). Electronically Signed   By: Rudie Meyer M.D.   On: 04/28/2022 14:43

## 2022-05-18 ENCOUNTER — Inpatient Hospital Stay (HOSPITAL_BASED_OUTPATIENT_CLINIC_OR_DEPARTMENT_OTHER): Payer: Medicare Other | Admitting: Hematology

## 2022-05-18 ENCOUNTER — Encounter: Payer: Self-pay | Admitting: Hematology

## 2022-05-18 ENCOUNTER — Inpatient Hospital Stay: Payer: Medicare Other

## 2022-05-18 VITALS — BP 108/62 | HR 87 | Temp 98.2°F | Resp 18 | Wt 121.2 lb

## 2022-05-18 DIAGNOSIS — C250 Malignant neoplasm of head of pancreas: Secondary | ICD-10-CM

## 2022-05-18 DIAGNOSIS — Z95828 Presence of other vascular implants and grafts: Secondary | ICD-10-CM

## 2022-05-18 DIAGNOSIS — C253 Malignant neoplasm of pancreatic duct: Secondary | ICD-10-CM | POA: Diagnosis not present

## 2022-05-18 LAB — COMPREHENSIVE METABOLIC PANEL
ALT: 23 U/L (ref 0–44)
AST: 32 U/L (ref 15–41)
Albumin: 3.3 g/dL — ABNORMAL LOW (ref 3.5–5.0)
Alkaline Phosphatase: 95 U/L (ref 38–126)
Anion gap: 7 (ref 5–15)
BUN: 25 mg/dL — ABNORMAL HIGH (ref 8–23)
CO2: 25 mmol/L (ref 22–32)
Calcium: 8.9 mg/dL (ref 8.9–10.3)
Chloride: 104 mmol/L (ref 98–111)
Creatinine, Ser: 0.81 mg/dL (ref 0.44–1.00)
GFR, Estimated: >60 mL/min (ref >60)
Glucose, Bld: 121 mg/dL — ABNORMAL HIGH (ref 70–99)
Potassium: 4 mmol/L (ref 3.5–5.1)
Sodium: 136 mmol/L (ref 135–145)
Total Bilirubin: 0.6 mg/dL (ref 0.3–1.2)
Total Protein: 6.3 g/dL — ABNORMAL LOW (ref 6.5–8.1)

## 2022-05-18 LAB — CBC WITH DIFFERENTIAL/PLATELET
Abs Immature Granulocytes: 0.04 10*3/uL (ref 0.00–0.07)
Basophils Absolute: 0 10*3/uL (ref 0.0–0.1)
Basophils Relative: 0 %
Eosinophils Absolute: 0.2 10*3/uL (ref 0.0–0.5)
Eosinophils Relative: 7 %
HCT: 31.7 % — ABNORMAL LOW (ref 36.0–46.0)
Hemoglobin: 10.4 g/dL — ABNORMAL LOW (ref 12.0–15.0)
Immature Granulocytes: 1 %
Lymphocytes Relative: 6 %
Lymphs Abs: 0.2 10*3/uL — ABNORMAL LOW (ref 0.7–4.0)
MCH: 33 pg (ref 26.0–34.0)
MCHC: 32.8 g/dL (ref 30.0–36.0)
MCV: 100.6 fL — ABNORMAL HIGH (ref 80.0–100.0)
Monocytes Absolute: 0.5 10*3/uL (ref 0.1–1.0)
Monocytes Relative: 18 %
Neutro Abs: 1.9 10*3/uL (ref 1.7–7.7)
Neutrophils Relative %: 68 %
Platelets: 173 10*3/uL (ref 150–400)
RBC: 3.15 MIL/uL — ABNORMAL LOW (ref 3.87–5.11)
RDW: 19.1 % — ABNORMAL HIGH (ref 11.5–15.5)
WBC: 2.8 10*3/uL — ABNORMAL LOW (ref 4.0–10.5)
nRBC: 0 % (ref 0.0–0.2)

## 2022-05-18 LAB — MAGNESIUM: Magnesium: 2.2 mg/dL (ref 1.7–2.4)

## 2022-05-18 MED ORDER — HEPARIN SOD (PORK) LOCK FLUSH 100 UNIT/ML IV SOLN
500.0000 [IU] | Freq: Once | INTRAVENOUS | Status: AC
  Administered 2022-05-18: 500 [IU] via INTRAVENOUS

## 2022-05-18 MED ORDER — SODIUM CHLORIDE 0.9% FLUSH
10.0000 mL | Freq: Once | INTRAVENOUS | Status: AC
  Administered 2022-05-18: 10 mL via INTRAVENOUS

## 2022-05-18 NOTE — Progress Notes (Signed)
Port flushed with good blood return noted. No bruising or swelling at site. Bandaid applied and patient discharged in satisfactory condition. VVS stable with no signs or symptoms of distressed noted. 

## 2022-05-18 NOTE — Patient Instructions (Addendum)
[facility]  Discharge Instructions  You were seen and examined today by [name].  [name] discussed your most recent lab work and CT scan which revealed that everything looks stable and improving.  [name] is repeating CT scan and labs before your next appointment. You should start to see improvements daily.  Follow-up as scheduled in 5 weeks.    Thank you for choosing [facility] to provide your oncology and hematology care.   To afford each patient quality time with our provider, please arrive at least 15 minutes before your scheduled appointment time. You may need to reschedule your appointment if you arrive late (10 or more minutes). Arriving late affects you and other patients whose appointments are after yours.  Also, if you miss three or more appointments without notifying the office, you may be dismissed from the clinic at the provider's discretion.    Again, thank you for choosing [facility].  Our hope is that these requests will decrease the amount of time that you wait before being seen by our physicians.   If you have a lab appointment with the Cancer Center - please note that after April 8th, all labs will be drawn in the cancer center.  You do not have to check in or register with the main entrance as you have in the past but will complete your check-in at the cancer center.            _____________________________________________________________  Should you have questions after your visit to [facility], please contact our office at [phone] and follow the prompts.  Our office hours are 8:00 a.m. to 4:30 p.m. Monday - Thursday and 8:00 a.m. to 2:30 p.m. Friday.  Please note that voicemails left after 4:00 p.m. may not be returned until the following business day.  We are closed weekends and all major holidays.  You do have access to a nurse 24-7, just call the main  number to the clinic [phone] and do not press any options, hold on the line and a nurse will answer the phone.    For prescription refill requests, have your pharmacy contact our office and allow 72 hours.    Masks are no longer required in the cancer centers. If you would like for your care team to wear a mask while they are taking care of you, please let them know. You may have one support person who is at least 79 years old accompany you for your appointments.

## 2022-05-18 NOTE — Progress Notes (Signed)
Patient has continued on Xeloda with some diarrhea, however not as severe as it has been in the past.

## 2022-05-19 ENCOUNTER — Other Ambulatory Visit: Payer: Self-pay

## 2022-06-22 ENCOUNTER — Inpatient Hospital Stay: Payer: Medicare Other | Attending: Hematology

## 2022-06-22 ENCOUNTER — Ambulatory Visit: Payer: Medicare Other | Admitting: Hematology

## 2022-06-22 ENCOUNTER — Ambulatory Visit (HOSPITAL_COMMUNITY)
Admission: RE | Admit: 2022-06-22 | Discharge: 2022-06-22 | Disposition: A | Discharge status: Home / Self Care | Payer: Medicare Other | Source: Ambulatory Visit | Attending: Hematology | Admitting: Hematology

## 2022-06-22 VITALS — BP 111/66 | HR 84 | Temp 97.9°F | Resp 18

## 2022-06-22 DIAGNOSIS — C259 Malignant neoplasm of pancreas, unspecified: Secondary | ICD-10-CM | POA: Insufficient documentation

## 2022-06-22 DIAGNOSIS — Z9049 Acquired absence of other specified parts of digestive tract: Secondary | ICD-10-CM | POA: Insufficient documentation

## 2022-06-22 DIAGNOSIS — K7689 Other specified diseases of liver: Secondary | ICD-10-CM | POA: Diagnosis not present

## 2022-06-22 DIAGNOSIS — I700 Atherosclerosis of aorta: Secondary | ICD-10-CM | POA: Diagnosis not present

## 2022-06-22 DIAGNOSIS — Z95828 Presence of other vascular implants and grafts: Secondary | ICD-10-CM

## 2022-06-22 DIAGNOSIS — N281 Cyst of kidney, acquired: Secondary | ICD-10-CM | POA: Diagnosis not present

## 2022-06-22 DIAGNOSIS — C250 Malignant neoplasm of head of pancreas: Secondary | ICD-10-CM | POA: Insufficient documentation

## 2022-06-22 LAB — COMPREHENSIVE METABOLIC PANEL
ALT: 29 U/L (ref 0–44)
AST: 30 U/L (ref 15–41)
Albumin: 3.4 g/dL — ABNORMAL LOW (ref 3.5–5.0)
Alkaline Phosphatase: 104 U/L (ref 38–126)
Anion gap: 10 (ref 5–15)
BUN: 32 mg/dL — ABNORMAL HIGH (ref 8–23)
CO2: 21 mmol/L — ABNORMAL LOW (ref 22–32)
Calcium: 9 mg/dL (ref 8.9–10.3)
Chloride: 106 mmol/L (ref 98–111)
Creatinine, Ser: 0.77 mg/dL (ref 0.44–1.00)
GFR, Estimated: >60 mL/min (ref >60)
Glucose, Bld: 111 mg/dL — ABNORMAL HIGH (ref 70–99)
Potassium: 4 mmol/L (ref 3.5–5.1)
Sodium: 137 mmol/L (ref 135–145)
Total Bilirubin: 0.4 mg/dL (ref 0.3–1.2)
Total Protein: 6.4 g/dL — ABNORMAL LOW (ref 6.5–8.1)

## 2022-06-22 LAB — CBC WITH DIFFERENTIAL/PLATELET
Abs Immature Granulocytes: 0.01 10*3/uL (ref 0.00–0.07)
Basophils Absolute: 0 10*3/uL (ref 0.0–0.1)
Basophils Relative: 0 %
Eosinophils Absolute: 0.1 10*3/uL (ref 0.0–0.5)
Eosinophils Relative: 1 %
HCT: 32.8 % — ABNORMAL LOW (ref 36.0–46.0)
Hemoglobin: 10.8 g/dL — ABNORMAL LOW (ref 12.0–15.0)
Immature Granulocytes: 0 %
Lymphocytes Relative: 40 %
Lymphs Abs: 1.5 10*3/uL (ref 0.7–4.0)
MCH: 34 pg (ref 26.0–34.0)
MCHC: 32.9 g/dL (ref 30.0–36.0)
MCV: 103.1 fL — ABNORMAL HIGH (ref 80.0–100.0)
Monocytes Absolute: 0.5 10*3/uL (ref 0.1–1.0)
Monocytes Relative: 14 %
Neutro Abs: 1.7 10*3/uL (ref 1.7–7.7)
Neutrophils Relative %: 45 %
Platelets: 136 10*3/uL — ABNORMAL LOW (ref 150–400)
RBC: 3.18 MIL/uL — ABNORMAL LOW (ref 3.87–5.11)
RDW: 17.1 % — ABNORMAL HIGH (ref 11.5–15.5)
WBC: 3.8 10*3/uL — ABNORMAL LOW (ref 4.0–10.5)
nRBC: 0 % (ref 0.0–0.2)

## 2022-06-22 MED ORDER — IOHEXOL 300 MG/ML  SOLN
100.0000 mL | Freq: Once | INTRAMUSCULAR | Status: AC | PRN
  Administered 2022-06-22: 100 mL via INTRAVENOUS

## 2022-06-22 MED ORDER — SODIUM CHLORIDE 0.9% FLUSH
10.0000 mL | Freq: Once | INTRAVENOUS | Status: AC
  Administered 2022-06-22: 10 mL via INTRAVENOUS

## 2022-06-22 MED ORDER — HEPARIN SOD (PORK) LOCK FLUSH 100 UNIT/ML IV SOLN
INTRAVENOUS | Status: AC
  Filled 2022-06-22: qty 5

## 2022-06-22 MED ORDER — HEPARIN SOD (PORK) LOCK FLUSH 100 UNIT/ML IV SOLN
500.0000 [IU] | Freq: Once | INTRAVENOUS | Status: AC
  Administered 2022-06-22: 500 [IU] via INTRAVENOUS

## 2022-06-22 NOTE — Patient Instructions (Signed)
[facility]  Discharge Instructions: Thank you for choosing [facility] to provide your oncology and hematology care.  If you have a lab appointment with the Cancer Center - please note that after April 8th, 2024, all labs will be drawn in the cancer center.  You do not have to check in or register with the main entrance as you have in the past but will complete your check-in in the cancer center.  Wear comfortable clothing and clothing appropriate for easy access to any Portacath or PICC line.   We strive to give you quality time with your provider. You may need to reschedule your appointment if you arrive late (15 or more minutes).  Arriving late affects you and other patients whose appointments are after yours.  Also, if you miss three or more appointments without notifying the office, you may be dismissed from the clinic at the provider's discretion.      For prescription refill requests, have your pharmacy contact our office and allow 72 hours for refills to be completed.    Today you received the following port flush with lab draw, return as scheduled.    To help prevent nausea and vomiting after your treatment, we encourage you to take your nausea medication as directed.  BELOW ARE SYMPTOMS THAT SHOULD BE REPORTED IMMEDIATELY: *FEVER GREATER THAN 100.4 F (38 C) OR HIGHER *CHILLS OR SWEATING *NAUSEA AND VOMITING THAT IS NOT CONTROLLED WITH YOUR NAUSEA MEDICATION *UNUSUAL SHORTNESS OF BREATH *UNUSUAL BRUISING OR BLEEDING *URINARY PROBLEMS (pain or burning when urinating, or frequent urination) *BOWEL PROBLEMS (unusual diarrhea, constipation, pain near the anus) TENDERNESS IN MOUTH AND THROAT WITH OR WITHOUT PRESENCE OF ULCERS (sore throat, sores in mouth, or a toothache) UNUSUAL RASH, SWELLING OR PAIN  UNUSUAL VAGINAL DISCHARGE OR ITCHING   Items with * indicate a potential emergency and should be followed up as soon as possible or go to the  Emergency Department if any problems should occur.  Please show the CHEMOTHERAPY ALERT CARD or IMMUNOTHERAPY ALERT CARD at check-in to the Emergency Department and triage nurse.  Should you have questions after your visit or need to cancel or reschedule your appointment, please contact [facility] [phone]  and follow the prompts.  Office hours are 8:00 a.m. to 4:30 p.m. Monday - Friday. Please note that voicemails left after 4:00 p.m. may not be returned until the following business day.  We are closed weekends and major holidays. You have access to a nurse at all times for urgent questions. Please call the main number to the clinic [phone] and follow the prompts.  For any non-urgent questions, you may also contact your provider using MyChart. We now offer e-Visits for anyone 18 and older to request care online for non-urgent symptoms. For details visit [email or web address].   Also download the MyChart app! Go to the app store, search "MyChart", open the app, select [facility], and log in with your MyChart username and password.   

## 2022-06-22 NOTE — Progress Notes (Signed)
Port flushed with good blood return noted. No bruising or swelling at site. Patient discharged in satisfactory condition. VVS stable with no signs or symptoms of distressed noted. 

## 2022-06-23 LAB — CANCER ANTIGEN 19-9: CA 19-9: 11 U/mL (ref 0–35)

## 2022-06-29 ENCOUNTER — Inpatient Hospital Stay: Payer: Medicare Other | Attending: Hematology | Admitting: Hematology

## 2022-06-29 VITALS — BP 120/72 | HR 83 | Temp 98.4°F | Resp 18 | Wt 126.0 lb

## 2022-06-29 DIAGNOSIS — C253 Malignant neoplasm of pancreatic duct: Secondary | ICD-10-CM | POA: Diagnosis present

## 2022-06-29 DIAGNOSIS — Z86711 Personal history of pulmonary embolism: Secondary | ICD-10-CM | POA: Diagnosis not present

## 2022-06-29 DIAGNOSIS — C250 Malignant neoplasm of head of pancreas: Secondary | ICD-10-CM

## 2022-06-29 NOTE — Progress Notes (Signed)
[facility] [address], [place] [address]    Clinic Day:  06/29/2022  Referring physician: [name], DO  Patient Care Team: [name], DO as PCP - General (Family Medicine) Jena Gauss, Gerrit Friends, MD as Consulting Physician (Gastroenterology) Doreatha Massed, MD as Medical Oncologist (Medical Oncology) Therese Sarah, RN as Oncology Nurse Navigator (Medical Oncology)   ASSESSMENT & PLAN:   Assessment: 1. 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 - EGD/EUS (07/09/2021) by Dr. Meridee Score: The region where the PD is strictured, darker appearance was identified.  There was no clear hypoechoic mass.  Endosonographic appearance suggestive of either chronic pancreatitis changes versus possible malignancy.  Endosonographic staging T1 N0 MX.  Pancreatic parenchymal abnormalities consisting of atrophy and hyperechoic strands.  No sign of significant pathology in the CBD.  No malignant appearing lymph nodes in the celiac region, peripancreatic region and porta hepatic region - Pathology (pancreatic duct FNA): Malignant cells consistent with adenocarcinoma - CA 19-9: 31 (0-35) - CTAP (07/13/2021): Fluid collection extending posterior to the gastric antrum and ventral to the pancreas.  Elongated collection extends to the greater curvature of the stomach with inflammation surrounding the collection suggestive of a pancreatic pseudocyst.  Chronic dilatation of pancreatic duct with abrupt termination of the duct dilatation in the head of the pancreas. - CT chest (07/16/2021): No evidence of metastatic disease in the chest. - 8 to 9 pound weight loss in the last 3 months, decreased appetite. - PET scan (07/30/2021): Faint focus of increased metabolic activity, SUV 3.6, measuring 9 mm at the junction of the pancreatic head and body, suspicious for pancreatic  adenocarcinoma.  No obvious extrapancreatic spread of tumor.  Cystic lesion along the posterior margin of the stomach and anterior margin of the pancreatic head favored pseudocyst.  Small amount of pelvic ascites. - MRI of the abdomen pancreatic protocol (08/03/2021): Similar appearance of the pancreas, with abrupt transition from dilated to decompressed duct in the pancreatic head with no evidence of underlying mass.  Pseudocyst enlarged position between pancreatic head and GE junction with significant mass effect upon the pyloric region and gastric outlet obstruction radiologically.  No evidence of abdominal metastatic disease. - FOLFOX on 09/16/2021, FOLFIRINOX cycle 2 on 09/30/2021 - 02/15/2022: Staging laparoscopy, exploratory laparotomy.  Whipple's aborted due to extensive thick scarring with obliteration of tissue planes preventing safe dissection of SMV. - XRT with Xeloda started on 04/05/2022, Xeloda dose reduced to 1000 mg twice daily on 04/20/2022 due to grade 1 HFSR, XRT completed on 05/18/2022   2. Social/family history: - She is accompanied by her son [name] today.  She lives at home by herself.  She is independent of ADLs and IADLs.  She did office work prior to retirement.  Non-smoker and nonalcoholic. - Brother (alcoholic) and maternal half uncle had pancreatitis.  No history of malignancy.  3. Pulmonary embolism: - She was diagnosed with pulmonary embolism in January 2023 and then will hospital.  There does not appear to be any provoking factors for PE.  Reportedly Doppler was negative for DVT.  She was treated with Eliquis for 60 days, discontinued after that.    Plan: 1. Stage I (T1 N0 MX) pancreatic adenocarcinoma: - She is doing physical therapy 3 times weekly - She is slowly getting stronger. - Reviewed labs from 06/22/2022: Albumin improved to 3.4.  LFTs are normal.  White count and  hemoglobin also slightly improved.  CA 19-9 is 11. - CT AP pancreatic protocol and CT chest were  reviewed: Stable pancreatic atrophy and ductal dilatation without residual mass.  Radiation changes in the distal stomach, proximal duodenum and right colon. - RTC 3 months for follow-up with repeat labs and CTAP pancreatic protocol.   2.  Weight loss: - Close she is eating better.  She gained 5 pounds since last visit.     Orders Placed This Encounter  Procedures   CT Abdomen Pelvis W Wo Contrast    Pancreatic Protocol    Standing Status:   Future    Standing Expiration Date:   06/29/2023    Order Specific Question:   If indicated for the ordered procedure, I authorize the administration of contrast media per Radiology protocol    Answer:   Yes    Order Specific Question:   Does the patient have a contrast media/X-ray dye allergy?    Answer:   No    Order Specific Question:   Preferred imaging location?    Answer:   [facility]    Order Specific Question:   Release to patient    Answer:   Immediate [1]    Order Specific Question:   If indicated for the ordered procedure, I authorize the administration of oral contrast media per Radiology protocol    Answer:   Yes   CBC with Differential/Platelet    Standing Status:   Future    Standing Expiration Date:   06/29/2023    Order Specific Question:   Release to patient    Answer:   Immediate   Comprehensive metabolic panel    Standing Status:   Future    Standing Expiration Date:   06/29/2023    Order Specific Question:   Release to patient    Answer:   Immediate   Cancer antigen 19-9    Standing Status:   Future    Standing Expiration Date:   06/29/2023   Ferritin    Standing Status:   Future    Standing Expiration Date:   06/29/2023    Order Specific Question:   Release to patient    Answer:   Immediate   Iron and TIBC    Standing Status:   Future    Standing Expiration Date:   06/29/2023    Order Specific Question:   Release to patient    Answer:   Immediate      I,Katie Daubenspeck,acting as a scribe for Doreatha Massed, MD.,have documented all relevant documentation on the behalf of Doreatha Massed, MD,as directed by  Doreatha Massed, MD while in the presence of Doreatha Massed, MD.   I, Doreatha Massed MD, have reviewed the above documentation for accuracy and completeness, and I agree with the above.   Doreatha Massed, MD   6/4/20245:34 PM  CHIEF COMPLAINT:   Diagnosis: pancreatic cancer    Cancer Staging  Malignant neoplasm of pancreas [facility]) Staging form: Exocrine Pancreas, AJCC 8th Edition - Clinical stage from 07/29/2021: Stage IA (cT1, cN0, cM0) - Unsigned    Prior Therapy: 1. FOLFIRINOX 2.  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 VUS in ATM called c.4367G>A and in [facility] called c.238G>A also identified. The remainder of testing was negative/normal. The report date is 09/20/2021.  The CancerNext-Expanded + RNAinsight gene panel offered by [employer] and includes sequencing and rearrangement analysis for the following 77 genes: IP, ALK, APC*, ATM*, AXIN2, BAP1, BARD1, BLM, BMPR1A, BRCA1*, BRCA2*, BRIP1*, CDC73, CDH1*,CDK4, CDKN1B, CDKN2A, CHEK2*, CTNNA1, DICER1, FANCC, FH, FLCN, GALNT12, KIF1B, LZTR1, MAX, MEN1, MET, MLH1*, MSH2*, MSH3, MSH6*, MUTYH*, NBN, NF1*, NF2, NTHL1, PALB2*, PHOX2B, PMS2*, POT1, PRKAR1A, PTCH1, PTEN*, RAD51C*, RAD51D*,RB1, RECQL, RET, SDHA, SDHAF2, SDHB, SDHC, SDHD, SMAD4, SMARCA4, SMARCB1, SMARCE1, STK11,  SUFU, TMEM127, TP53*,TSC1, TSC2, VHL and XRCC2 (sequencing and deletion/duplication); EGFR, EGLN1, HOXB13, KIT, MITF, PDGFRA, POLD1 and POLE (sequencing only); EPCAM and GREM1 (deletion/duplication only).      INTERVAL HISTORY:   Sheryl Bender is a 77 y.o. female presenting to clinic today for follow up of pancreatic cancer. She was last seen by me on 05/11/22.  Today, she states that she is doing well overall. Her appetite level is at 50 %. Her energy level is at 50 %.  PAST MEDICAL HISTORY:   Past Medical History: Past Medical History:  Diagnosis Date   Anxiety    Arthritis    Depression    Dysrhythmia    hx palpitations greater than 5 yrs -neg echo, stress per pt ? where or dr   GERD (gastroesophageal reflux disease)    occ   Nausea & vomiting 08/24/2021   Pancreatic adenocarcinoma (HCC)    Pancreatic pseudocyst    Port-A-Cath in place 09/15/2021   Pulmonary embolism [facility])    September 2022    Surgical History: Past Surgical History:  Procedure Laterality Date   ANTERIOR LAT LUMBAR FUSION Right 12/16/2015   Procedure: RIGHT LUMBAR TWO-THREE, LUMBAR THREE-FOUR, LUMBAR FOUR-FIVE ANTEROLATERAL LUMBAR INTERBODY FUSION;  Surgeon: Maeola Harman, MD;  Location: [facility] OR;  Service: Neurosurgery;  Laterality: Right;   BALLOON DILATION N/A 08/27/2021   Procedure: BALLOON DILATION;  Surgeon: Meridee Score Netty Starring., MD;  Location: [facility] ENDOSCOPY;  Service: Gastroenterology;  Laterality: N/A;   BIOPSY  07/09/2021   Procedure: BIOPSY;  Surgeon: Meridee Score Netty Starring., MD;  Location: [facility] ENDOSCOPY;  Service: Gastroenterology;;   CYST GASTROSTOMY  08/27/2021   Procedure: CYST GASTROSTOMY;  Surgeon: Lemar Lofty., MD;  Location: [facility] ENDOSCOPY;  Service: Gastroenterology;;   ESOPHAGOGASTRODUODENOSCOPY N/A 07/09/2021   Procedure: ESOPHAGOGASTRODUODENOSCOPY (EGD);  Surgeon: Lemar Lofty., MD;  Location: [facility] ENDOSCOPY;  Service: Gastroenterology;  Laterality: N/A;   ESOPHAGOGASTRODUODENOSCOPY  (EGD) WITH PROPOFOL N/A 08/27/2021   Procedure: ESOPHAGOGASTRODUODENOSCOPY (EGD) WITH PROPOFOL;  Surgeon: Meridee Score Netty Starring., MD;  Location: [facility] ENDOSCOPY;  Service: Gastroenterology;  Laterality: N/A;   ESOPHAGOGASTRODUODENOSCOPY (EGD) WITH PROPOFOL N/A 10/08/2021   Procedure: ESOPHAGOGASTRODUODENOSCOPY (EGD) WITH PROPOFOL;  Surgeon: Meridee Score Netty Starring., MD;  Location: [facility] ENDOSCOPY;  Service: Gastroenterology;  Laterality: N/A;   EUS N/A 07/09/2021   Procedure: UPPER ENDOSCOPIC ULTRASOUND (EUS) RADIAL;  Surgeon: Lemar Lofty., MD;  Location: [facility] ENDOSCOPY;  Service: Gastroenterology;  Laterality: N/A;   EUS N/A 08/27/2021   Procedure: UPPER ENDOSCOPIC ULTRASOUND (EUS) LINEAR;  Surgeon: Lemar Lofty., MD;  Location: [facility] ENDOSCOPY;  Service: Gastroenterology;  Laterality: N/A;   FINE NEEDLE ASPIRATION  07/09/2021   Procedure: FINE NEEDLE ASPIRATION (FNA) LINEAR;  Surgeon: Meridee Score,  Netty Starring., MD;  Location: [facility] ENDOSCOPY;  Service: Gastroenterology;;   IR IMAGING GUIDED PORT INSERTION  09/14/2021   LAPAROSCOPY N/A 02/15/2022   Procedure: STAGING DIAGNOSTIC;  Surgeon: Fritzi Mandes, MD;  Location: [facility] OR;  Service: General;  Laterality: N/A;   LUMBAR PERCUTANEOUS PEDICLE SCREW 3 LEVEL Bilateral 12/16/2015   Procedure: PERCUTANEOUS PEDICLE SCREWS BILATERALLY AT LUMBAR TWO-FIVE;  Surgeon: Maeola Harman, MD;  Location: [facility] OR;  Service: Neurosurgery;  Laterality: Bilateral;   PANCREATIC STENT PLACEMENT  08/27/2021   Procedure: PANCREATIC STENT PLACEMENT;  Surgeon: Lemar Lofty., MD;  Location: [facility] ENDOSCOPY;  Service: Gastroenterology;;   Francine Graven REMOVAL  10/08/2021   Procedure: STENT REMOVAL;  Surgeon: Lemar Lofty., MD;  Location: [facility] ENDOSCOPY;  Service: Gastroenterology;;   TUBAL LIGATION  1974   WHIPPLE PROCEDURE N/A 02/15/2022   Procedure: ATTEMPTED WHIPPLE PROCEDURE;  Surgeon: Fritzi Mandes, MD;  Location: [facility] OR;  Service: General;  Laterality: N/A;     Social History: Social History   Socioeconomic History   Marital status: Widowed    Spouse name: Not on file   Number of children: Not on file   Years of education: Not on file   Highest education level: Not on file  Occupational History   Not on file  Tobacco Use   Smoking status: Never   Smokeless tobacco: Never  Vaping Use   Vaping Use: Never used  Substance and Sexual Activity   Alcohol use: No   Drug use: Never   Sexual activity: Not on file  Other Topics Concern   Not on file  Social History Narrative   Not on file   Social Determinants of Health   Financial Resource Strain: Not on file  Food Insecurity: Not on file  Transportation Needs: Not on file  Physical Activity: Not on file  Stress: Not on file  Social Connections: Not on file  Intimate Partner Violence: Not on file    Family History: Family History  Problem Relation Age of Onset   Pancreatitis Brother        alcoholic pancreatitis   Pancreatic cancer Neg Hx    Colon cancer Neg Hx     Current Medications:  Current Outpatient Medications:    acetaminophen (TYLENOL) 500 MG tablet, Take 2 tablets (1,000 mg total) by mouth every 8 (eight) hours as needed for moderate pain, headache or fever. (Patient taking differently: Take 750-1,000 mg by mouth every 8 (eight) hours as needed for moderate pain, headache or fever.), Disp: , Rfl:    Aromatic Inhalants (VICKS VAPOINHALER) INHA, Inhale 1 puff into the lungs daily as needed (congestion)., Disp: , Rfl:    betamethasone dipropionate 0.05 % cream, Apply topically 2 (two) times daily., Disp: 30 g, Rfl: 3   buPROPion (WELLBUTRIN XL) 300 MG 24 hr tablet, Take 300 mg by mouth daily after breakfast. Takes differently from prescribed: 20mg  BID, Disp: , Rfl:    busPIRone (BUSPAR) 10 MG tablet, Take 20 mg by mouth 3 (three) times daily., Disp: , Rfl:    calcium carbonate (TUMS - DOSED IN MG ELEMENTAL CALCIUM) 500 MG chewable tablet, Chew 1 tablet (200 mg of  elemental calcium total) by mouth 2 (two) times daily as needed for indigestion or heartburn. (Patient taking differently: Chew 1-2 tablets by mouth 2 (two) times daily as needed for indigestion or heartburn.), Disp: , Rfl:    Cholecalciferol (VITAMIN D3) 125 MCG (5000 UT) CAPS, Take 5,000 Units by mouth every other day., Disp: ,  Rfl:    escitalopram (LEXAPRO) 20 MG tablet, Take 20 mg by mouth at bedtime., Disp: , Rfl:    fluorouracil CALGB 82956 1,920 mg/m2 in sodium chloride 0.9 % 150 mL, Inject 1,920 mg/m2 into the vein over 48 hr., Disp: , Rfl:    lactose free nutrition (BOOST) LIQD, Take 237 mLs by mouth 2 (two) times daily between meals., Disp: , Rfl:    lidocaine-prilocaine (EMLA) cream, Apply a small amount to port a cath site (do not rub in) and cover with plastic wrap 1 hour prior to infusion appointments, Disp: 30 g, Rfl: 3   linaclotide (LINZESS) 72 MCG capsule, Take 1 capsule (72 mcg total) by mouth daily before breakfast., Disp: 30 capsule, Rfl: 12   methocarbamol (ROBAXIN) 500 MG tablet, Take 1 tablet (500 mg total) by mouth every 8 (eight) hours as needed for muscle spasms., Disp: 15 tablet, Rfl: 0   metoprolol succinate (TOPROL-XL) 25 MG 24 hr tablet, Take 0.5 tablets (12.5 mg total) by mouth daily after breakfast., Disp: 14 tablet, Rfl: 0   mirtazapine (REMERON) 30 MG tablet, Take 30 mg by mouth at bedtime., Disp: , Rfl:    omeprazole (PRILOSEC) 40 MG capsule, Take 1 capsule (40 mg total) by mouth daily., Disp: 30 capsule, Rfl: 12   ondansetron (ZOFRAN-ODT) 8 MG disintegrating tablet, Take 1 tablet (8 mg total) by mouth every 8 (eight) hours as needed for nausea or vomiting., Disp: 30 tablet, Rfl: 0   Polyethyl Glycol-Propyl Glycol (SYSTANE OP), Place 1 drop into both eyes at bedtime., Disp: , Rfl:    potassium chloride SA (KLOR-CON M) 20 MEQ tablet, One po bid x 3 days, then one po once a day (Patient taking differently: Take 20 mEq by mouth daily. One po bid x 3 days, then one po  once a day), Disp: 20 tablet, Rfl: 0   simethicone (MYLICON) 80 MG chewable tablet, Chew 1 tablet (80 mg total) by mouth 4 (four) times daily., Disp: 30 tablet, Rfl: 0   vitamin B-12 (CYANOCOBALAMIN) 500 MCG tablet, Take 1 tablet (500 mcg total) by mouth daily., Disp: 30 tablet, Rfl: 2   Allergies: Allergies  Allergen Reactions   Other Hives and Other (See Comments)    Cherry wood just cut- "smelled it and broke out"; cannot tolerate ANY cherry fragrances, either      Cherry Hives   Wound Dressing Adhesive Rash and Other (See Comments)    Band-Aids = local reaction    REVIEW OF SYSTEMS:   Review of Systems  Constitutional:  Negative for chills and fever.  HENT:   Negative for lump/mass, mouth sores, nosebleeds, sore throat and trouble swallowing.   Eyes:  Negative for eye problems.  Respiratory:  Positive for shortness of breath. Negative for cough.   Cardiovascular:  Negative for chest pain, leg swelling and palpitations.  Gastrointestinal:  Negative for abdominal pain, constipation, diarrhea and vomiting.  Genitourinary:  Negative for bladder incontinence, difficulty urinating, dysuria, frequency, hematuria and nocturia.   Musculoskeletal:  Negative for arthralgias, back pain, flank pain, myalgias and neck pain.  Skin:  Negative for itching and rash.  Neurological:  Positive for dizziness and numbness. Negative for headaches.  Hematological:  Does not bruise/bleed easily.  Psychiatric/Behavioral:  Positive for depression. Negative for sleep disturbance and suicidal ideas. The patient is nervous/anxious.   All other systems reviewed and are negative.    VITALS:   Blood pressure 120/72, pulse 83, temperature 98.4 F (36.9 C), temperature source Oral, resp.  rate 18, weight 126 lb (57.2 kg), SpO2 98 %.  Wt Readings from Last 3 Encounters:  06/29/22 126 lb (57.2 kg)  05/18/22 121 lb 3.2 oz (55 kg)  05/18/22 121 lb 3.2 oz (55 kg)    Body mass index is 22.32  kg/m.  Performance status (ECOG): 1 - Symptomatic but completely ambulatory  PHYSICAL EXAM:   Physical Exam Vitals and nursing note reviewed. Exam conducted with a chaperone present.  Constitutional:      Appearance: Normal appearance.  Cardiovascular:     Rate and Rhythm: Normal rate and regular rhythm.     Pulses: Normal pulses.     Heart sounds: Normal heart sounds.  Pulmonary:     Effort: Pulmonary effort is normal.     Breath sounds: Normal breath sounds.  Abdominal:     Palpations: Abdomen is soft. There is no hepatomegaly, splenomegaly or mass.     Tenderness: There is no abdominal tenderness.  Musculoskeletal:     Right lower leg: No edema.     Left lower leg: No edema.  Lymphadenopathy:     Cervical: No cervical adenopathy.     Right cervical: No superficial, deep or posterior cervical adenopathy.    Left cervical: No superficial, deep or posterior cervical adenopathy.     Upper Body:     Right upper body: No supraclavicular or axillary adenopathy.     Left upper body: No supraclavicular or axillary adenopathy.  Neurological:     General: No focal deficit present.     Mental Status: She is alert and oriented to person, place, and time.  Psychiatric:        Mood and Affect: Mood normal.        Behavior: Behavior normal.     LABS:      Latest Ref Rng & Units 06/22/2022   11:05 AM 05/18/2022    2:36 PM 05/11/2022   12:59 PM  CBC  WBC 4.0 - 10.5 K/uL 3.8  2.8  3.7   Hemoglobin 12.0 - 15.0 g/dL 16.1  09.6  9.6   Hematocrit 36.0 - 46.0 % 32.8  31.7  30.1   Platelets 150 - 400 K/uL 136  173  182       Latest Ref Rng & Units 06/22/2022   11:05 AM 05/18/2022    2:36 PM 05/11/2022   12:59 PM  CMP  Glucose 70 - 99 mg/dL 045  409  811   BUN 8 - 23 mg/dL 32  25  14   Creatinine 0.44 - 1.00 mg/dL 9.14  7.82  9.56   Sodium 135 - 145 mmol/L 137  136  138   Potassium 3.5 - 5.1 mmol/L 4.0  4.0  3.6   Chloride 98 - 111 mmol/L 106  104  103   CO2 22 - 32 mmol/L 21  25   28    Calcium 8.9 - 10.3 mg/dL 9.0  8.9  8.3   Total Protein 6.5 - 8.1 g/dL 6.4  6.3  5.5   Total Bilirubin 0.3 - 1.2 mg/dL 0.4  0.6  <2.1   Alkaline Phos 38 - 126 U/L 104  95  73   AST 15 - 41 U/L 30  32  24   ALT 0 - 44 U/L 29  23  16       No results found for: "CEA1", "CEA" / No results found for: "CEA1", "CEA" No results found for: "PSA1" Lab Results  Component Value Date   CAN199  11 06/22/2022   No results found for: "CAN125"  No results found for: "TOTALPROTELP", "ALBUMINELP", "A1GS", "A2GS", "BETS", "BETA2SER", "GAMS", "MSPIKE", "SPEI" Lab Results  Component Value Date   TIBC 154 (L) 07/14/2021   FERRITIN 273 07/14/2021   IRONPCTSAT 11 07/14/2021   No results found for: "LDH"   STUDIES:   CT Abdomen Pelvis W Wo Contrast  Result Date: 06/27/2022 CLINICAL DATA:  Follow-up pancreatic cancer, status post radiation and chemotherapy EXAM: CT CHEST WITH CONTRAST CT ABDOMEN AND PELVIS WITH AND WITHOUT CONTRAST TECHNIQUE: Multidetector CT imaging of the chest was performed during intravenous contrast administration. Multidetector CT imaging of the abdomen and pelvis was performed following the standard protocol before and during bolus administration of intravenous contrast. RADIATION DOSE REDUCTION: This exam was performed according to the departmental dose-optimization program which includes automated exposure control, adjustment of the mA and/or kV according to patient size and/or use of iterative reconstruction technique. CONTRAST:  OMNIPAQUE IOHEXOL 300 MG/ML  SOLN COMPARISON:  CT abdomen/pelvis dated 05/05/2022. PET-CT dated 07/30/2021. FINDINGS: CT CHEST FINDINGS Cardiovascular: Heart is normal in size.  No pericardial effusion. No evidence of thoracic aortic aneurysm. Mild three-vessel coronary atherosclerosis. Right chest port terminates at the cavoatrial junction. Mediastinum/Nodes: No suspicious mediastinal lymphadenopathy. Visualized thyroid is unremarkable. Lungs/Pleura:  No suspicious pulmonary nodules. Minimal dependent atelectasis in the bilateral lower lobes. No focal consolidation. No pleural effusion or pneumothorax. Musculoskeletal: Degenerative changes of the thoracic spine. CT ABDOMEN AND PELVIS FINDINGS Hepatobiliary: Small hepatic cysts measuring up to 6 mm (series 9/image 126). Status post cholecystectomy. No intrahepatic or extrahepatic duct dilatation. Pancreas: Stable atrophy the pancreatic body/tail with a dilatation of the main pancreatic duct measuring up to 8 mm. Abrupt cut of the duct in the region of the pancreatic neck without measurable obstructing mass. Spleen: Within normal limits. Adrenals/Urinary Tract: Adrenal glands are within normal limits. 11 mm simple posterior right lower pole renal cyst (series 9/image 139), benign (Bosniak I). No follow-up is recommended. Bilateral renal sinus cysts, left greater than right. No hydronephrosis. Bladder is within normal limits. Stomach/Bowel: Stomach is notable for mild wall thickening involving the gastric antrum (series 9/image 162), likely reflecting radiation changes. Suspected secondary inflammatory changes involving the proximal duodenum. No evidence of bowel obstruction. Normal appendix (series 9/image 212). Mild pericolonic stranding along the gallbladder fossa/hepatic flexure (series 9/image 169). No colonic wall thickening or mass is evident on CT. Vascular/Lymphatic: No evidence of abdominal aortic aneurysm. Atherosclerotic calcifications of the abdominal aorta and branch vessels. No suspicious abdominopelvic lymphadenopathy. Reproductive: Uterus is within normal limits. Bilateral ovaries are within normal limits. Other: No abdominopelvic ascites. Musculoskeletal: Status post PLIF at L C5. Degenerative changes of the lumbar spine. 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 Radiation changes involving the distal stomach, proximal duodenum, and right  colon at the hepatic flexure. No findings suspicious for metastatic disease. Electronically Signed   By: Charline Bills M.D.   On: 06/27/2022 02:01   CT Chest W Contrast  Result Date: 06/27/2022 CLINICAL DATA:  Follow-up pancreatic cancer, status post radiation and chemotherapy EXAM: CT CHEST WITH CONTRAST CT ABDOMEN AND PELVIS WITH AND WITHOUT CONTRAST TECHNIQUE: Multidetector CT imaging of the chest was performed during intravenous contrast administration. Multidetector CT imaging of the abdomen and pelvis was performed following the standard protocol before and during bolus administration of intravenous contrast. RADIATION DOSE REDUCTION: This exam was performed according to the departmental dose-optimization program which includes automated exposure control, adjustment of the  mA and/or kV according to patient size and/or use of iterative reconstruction technique. CONTRAST:  OMNIPAQUE IOHEXOL 300 MG/ML  SOLN COMPARISON:  CT abdomen/pelvis dated 05/05/2022. PET-CT dated 07/30/2021. FINDINGS: CT CHEST FINDINGS Cardiovascular: Heart is normal in size.  No pericardial effusion. No evidence of thoracic aortic aneurysm. Mild three-vessel coronary atherosclerosis. Right chest port terminates at the cavoatrial junction. Mediastinum/Nodes: No suspicious mediastinal lymphadenopathy. Visualized thyroid is unremarkable. Lungs/Pleura: No suspicious pulmonary nodules. Minimal dependent atelectasis in the bilateral lower lobes. No focal consolidation. No pleural effusion or pneumothorax. Musculoskeletal: Degenerative changes of the thoracic spine. CT ABDOMEN AND PELVIS FINDINGS Hepatobiliary: Small hepatic cysts measuring up to 6 mm (series 9/image 126). Status post cholecystectomy. No intrahepatic or extrahepatic duct dilatation. Pancreas: Stable atrophy the pancreatic body/tail with a dilatation of the main pancreatic duct measuring up to 8 mm. Abrupt cut of the duct in the region of the pancreatic neck without  measurable obstructing mass. Spleen: Within normal limits. Adrenals/Urinary Tract: Adrenal glands are within normal limits. 11 mm simple posterior right lower pole renal cyst (series 9/image 139), benign (Bosniak I). No follow-up is recommended. Bilateral renal sinus cysts, left greater than right. No hydronephrosis. Bladder is within normal limits. Stomach/Bowel: Stomach is notable for mild wall thickening involving the gastric antrum (series 9/image 162), likely reflecting radiation changes. Suspected secondary inflammatory changes involving the proximal duodenum. No evidence of bowel obstruction. Normal appendix (series 9/image 212). Mild pericolonic stranding along the gallbladder fossa/hepatic flexure (series 9/image 169). No colonic wall thickening or mass is evident on CT. Vascular/Lymphatic: No evidence of abdominal aortic aneurysm. Atherosclerotic calcifications of the abdominal aorta and branch vessels. No suspicious abdominopelvic lymphadenopathy. Reproductive: Uterus is within normal limits. Bilateral ovaries are within normal limits. Other: No abdominopelvic ascites. Musculoskeletal: Status post PLIF at L C5. Degenerative changes of the lumbar spine. 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 Radiation changes involving the distal stomach, proximal duodenum, and right colon at the hepatic flexure. No findings suspicious for metastatic disease. Electronically Signed   By: Charline Bills M.D.   On: 06/27/2022 02:01

## 2022-06-29 NOTE — Patient Instructions (Signed)
[facility]  Discharge Instructions  You were seen and examined today by [name].  [name] discussed your most recent lab work and CT scan which revealed that everything is improving. Your scan looks stable.  [name] is going to repeat scan and labs before your next appointment.   Follow-up as scheduled in 3 months.    Thank you for choosing [facility] to provide your oncology and hematology care.   To afford each patient quality time with our provider, please arrive at least 15 minutes before your scheduled appointment time. You may need to reschedule your appointment if you arrive late (10 or more minutes). Arriving late affects you and other patients whose appointments are after yours.  Also, if you miss three or more appointments without notifying the office, you may be dismissed from the clinic at the provider's discretion.    Again, thank you for choosing [facility].  Our hope is that these requests will decrease the amount of time that you wait before being seen by our physicians.   If you have a lab appointment with the Cancer Center - please note that after April 8th, all labs will be drawn in the cancer center.  You do not have to check in or register with the main entrance as you have in the past but will complete your check-in at the cancer center.            _____________________________________________________________  Should you have questions after your visit to [facility], please contact our office at [phone] and follow the prompts.  Our office hours are 8:00 a.m. to 4:30 p.m. Monday - Thursday and 8:00 a.m. to 2:30 p.m. Friday.  Please note that voicemails left after 4:00 p.m. may not be returned until the following business day.  We are closed weekends and all major holidays.  You do have access to a nurse 24-7, just call the main number to the clinic [phone]  and do not press any options, hold on the line and a nurse will answer the phone.    For prescription refill requests, have your pharmacy contact our office and allow 72 hours.    Masks are no longer required in the cancer centers. If you would like for your care team to wear a mask while they are taking care of you, please let them know. You may have one support person who is at least 79 years old accompany you for your appointments.

## 2022-06-30 ENCOUNTER — Other Ambulatory Visit: Payer: Self-pay

## 2022-07-23 NOTE — Progress Notes (Signed)
Patient called complaining of pancreatic pain. 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  Patient informed to push fluids and follow a bland diet. Informed that if worsens that she needs to go to the ED. Patient will call us back on Monday to follow up on how she gets.

## 2022-09-21 ENCOUNTER — Other Ambulatory Visit: Payer: Self-pay | Admitting: Gastroenterology

## 2022-09-30 ENCOUNTER — Ambulatory Visit (HOSPITAL_COMMUNITY)
Admission: RE | Admit: 2022-09-30 | Discharge: 2022-09-30 | Disposition: A | Discharge status: Home / Self Care | Payer: Medicare Other | Source: Ambulatory Visit | Attending: Hematology | Admitting: Hematology

## 2022-09-30 ENCOUNTER — Inpatient Hospital Stay: Payer: Medicare Other | Attending: Hematology

## 2022-09-30 DIAGNOSIS — Z86711 Personal history of pulmonary embolism: Secondary | ICD-10-CM | POA: Insufficient documentation

## 2022-09-30 DIAGNOSIS — C253 Malignant neoplasm of pancreatic duct: Secondary | ICD-10-CM | POA: Insufficient documentation

## 2022-09-30 DIAGNOSIS — C250 Malignant neoplasm of head of pancreas: Secondary | ICD-10-CM

## 2022-09-30 DIAGNOSIS — D509 Iron deficiency anemia, unspecified: Secondary | ICD-10-CM | POA: Insufficient documentation

## 2022-09-30 LAB — CBC WITH DIFFERENTIAL/PLATELET
Abs Immature Granulocytes: 0.01 10*3/uL (ref 0.00–0.07)
Basophils Absolute: 0 10*3/uL (ref 0.0–0.1)
Basophils Relative: 0 %
Eosinophils Absolute: 0 10*3/uL (ref 0.0–0.5)
Eosinophils Relative: 1 %
HCT: 29.5 % — ABNORMAL LOW (ref 36.0–46.0)
Hemoglobin: 9.5 g/dL — ABNORMAL LOW (ref 12.0–15.0)
Immature Granulocytes: 0 %
Lymphocytes Relative: 31 %
Lymphs Abs: 1.3 10*3/uL (ref 0.7–4.0)
MCH: 32.9 pg (ref 26.0–34.0)
MCHC: 32.2 g/dL (ref 30.0–36.0)
MCV: 102.1 fL — ABNORMAL HIGH (ref 80.0–100.0)
Monocytes Absolute: 0.6 10*3/uL (ref 0.1–1.0)
Monocytes Relative: 15 %
Neutro Abs: 2.1 10*3/uL (ref 1.7–7.7)
Neutrophils Relative %: 53 %
Platelets: 240 10*3/uL (ref 150–400)
RBC: 2.89 MIL/uL — ABNORMAL LOW (ref 3.87–5.11)
RDW: 14.4 % (ref 11.5–15.5)
WBC: 4.1 10*3/uL (ref 4.0–10.5)
nRBC: 0 % (ref 0.0–0.2)

## 2022-09-30 LAB — COMPREHENSIVE METABOLIC PANEL
ALT: 29 U/L (ref 0–44)
AST: 31 U/L (ref 15–41)
Albumin: 3.6 g/dL (ref 3.5–5.0)
Alkaline Phosphatase: 129 U/L — ABNORMAL HIGH (ref 38–126)
Anion gap: 7 (ref 5–15)
BUN: 28 mg/dL — ABNORMAL HIGH (ref 8–23)
CO2: 25 mmol/L (ref 22–32)
Calcium: 8.4 mg/dL — ABNORMAL LOW (ref 8.9–10.3)
Chloride: 102 mmol/L (ref 98–111)
Creatinine, Ser: 0.72 mg/dL (ref 0.44–1.00)
GFR, Estimated: >60 mL/min (ref >60)
Glucose, Bld: 94 mg/dL (ref 70–99)
Potassium: 4 mmol/L (ref 3.5–5.1)
Sodium: 134 mmol/L — ABNORMAL LOW (ref 135–145)
Total Bilirubin: 0.6 mg/dL (ref 0.3–1.2)
Total Protein: 6.5 g/dL (ref 6.5–8.1)

## 2022-09-30 LAB — IRON AND TIBC
Iron: 84 ug/dL (ref 28–170)
Saturation Ratios: 22 % (ref 10.4–31.8)
TIBC: 376 ug/dL (ref 250–450)
UIBC: 292 ug/dL

## 2022-09-30 LAB — FERRITIN: Ferritin: 21 ng/mL (ref 11–307)

## 2022-09-30 MED ORDER — IOHEXOL 300 MG/ML  SOLN
100.0000 mL | Freq: Once | INTRAMUSCULAR | Status: AC | PRN
  Administered 2022-09-30: 100 mL via INTRAVENOUS

## 2022-09-30 MED ORDER — HEPARIN SOD (PORK) LOCK FLUSH 100 UNIT/ML IV SOLN
500.0000 [IU] | Freq: Once | INTRAVENOUS | Status: AC
  Administered 2022-09-30: 500 [IU] via INTRAVENOUS

## 2022-09-30 MED ORDER — HEPARIN SOD (PORK) LOCK FLUSH 100 UNIT/ML IV SOLN: 500.0000 [IU] | Freq: Once | INTRAVENOUS | Status: DC

## 2022-09-30 MED ORDER — SODIUM CHLORIDE 0.9% FLUSH
10.0000 mL | Freq: Once | INTRAVENOUS | Status: AC
  Administered 2022-09-30: 10 mL via INTRAVENOUS

## 2022-09-30 MED ORDER — HEPARIN SOD (PORK) LOCK FLUSH 100 UNIT/ML IV SOLN
INTRAVENOUS | Status: AC
  Filled 2022-09-30: qty 5

## 2022-09-30 NOTE — Patient Instructions (Signed)

## 2022-09-30 NOTE — Progress Notes (Signed)
Patients port flushed without difficulty.  Good blood return noted with no bruising or swelling noted at site.  Band aid applied.  VSS with discharge and left in satisfactory condition with no s/s of distress noted.   

## 2022-10-01 LAB — CANCER ANTIGEN 19-9: CA 19-9: 32 U/mL (ref 0–35)

## 2022-10-06 ENCOUNTER — Inpatient Hospital Stay (HOSPITAL_BASED_OUTPATIENT_CLINIC_OR_DEPARTMENT_OTHER): Payer: Medicare Other | Admitting: Hematology

## 2022-10-06 VITALS — BP 113/66 | HR 81 | Temp 98.4°F | Resp 18 | Wt 129.2 lb

## 2022-10-06 DIAGNOSIS — Z86711 Personal history of pulmonary embolism: Secondary | ICD-10-CM | POA: Diagnosis not present

## 2022-10-06 DIAGNOSIS — C253 Malignant neoplasm of pancreatic duct: Secondary | ICD-10-CM | POA: Diagnosis present

## 2022-10-06 DIAGNOSIS — C250 Malignant neoplasm of head of pancreas: Secondary | ICD-10-CM

## 2022-10-06 DIAGNOSIS — D509 Iron deficiency anemia, unspecified: Secondary | ICD-10-CM | POA: Diagnosis not present

## 2022-10-06 NOTE — Patient Instructions (Addendum)
[facility]  Discharge Instructions  You were seen and examined today by [name].  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 Your labs show that your iron is low.  [name] recommends that you have IV iron infusion.   Follow-up as scheduled in 4 weeks.    Thank you for choosing [facility] to provide your oncology and hematology care.   To afford each patient quality time with our provider, please arrive at least 15 minutes before your scheduled appointment time. You may need to reschedule your appointment if you arrive late (10 or more minutes). Arriving late affects you and other patients whose appointments are after yours.  Also, if you miss three or more appointments without notifying the office, you may be dismissed from the clinic at the provider's discretion.    Again, thank you for choosing [facility].  Our hope is that these requests will decrease the amount of time that you wait before being seen by our physicians.   If you have a lab appointment with the Cancer Center - please note that after April 8th, all labs will be drawn in the cancer center.  You do not have to check in or register with the main entrance as you have in the past but will complete your check-in at the cancer center.            _____________________________________________________________  Should you have questions after your visit to [facility], please contact our office at [phone] and follow the prompts.  Our office hours are 8:00 a.m. to 4:30 p.m. Monday - Thursday and 8:00 a.m. to 2:30 p.m. Friday.  Please note that voicemails left after 4:00 p.m. may not be returned until the following business day.  We are closed weekends and all major holidays.  You do have access to a nurse 24-7, just call the main number to the clinic  [phone] and do not press any options, hold on the line and a nurse will answer the phone.    For prescription refill requests, have your pharmacy contact our office and allow 72 hours.    Masks are no longer required in the cancer centers. If you would like for your care team to wear a mask while they are taking care of you, please let them know. You may have one support person who is at least 79 years old accompany you for your appointments.

## 2022-10-06 NOTE — Progress Notes (Signed)
[facility] [address] [place] [address]    Clinic Day:  10/06/2022  Referring physician: [name], DO  Patient Care Team: [name], DO as PCP - General (Family Medicine) Jena Gauss, Gerrit Friends, MD as Consulting Physician (Gastroenterology) Doreatha Massed, MD as Medical Oncologist (Medical Oncology) Therese Sarah, RN as Oncology Nurse Navigator (Medical Oncology)   ASSESSMENT & PLAN:   Assessment: 1. 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 - EGD/EUS (07/09/2021) by Dr. Meridee Score: The region where the PD is strictured, darker appearance was identified.  There was no clear hypoechoic mass.  Endosonographic appearance suggestive of either chronic pancreatitis changes versus possible malignancy.  Endosonographic staging T1 N0 MX.  Pancreatic parenchymal abnormalities consisting of atrophy and hyperechoic strands.  No sign of significant pathology in the CBD.  No malignant appearing lymph nodes in the celiac region, peripancreatic region and porta hepatic region - Pathology (pancreatic duct FNA): Malignant cells consistent with adenocarcinoma - CA 19-9: 31 (0-35) - CTAP (07/13/2021): Fluid collection extending posterior to the gastric antrum and ventral to the pancreas.  Elongated collection extends to the greater curvature of the stomach with inflammation surrounding the collection suggestive of a pancreatic pseudocyst.  Chronic dilatation of pancreatic duct with abrupt termination of the duct dilatation in the head of the pancreas. - CT chest (07/16/2021): No evidence of metastatic disease in the chest. - 8 to 9 pound weight loss in the last 3 months, decreased appetite. - PET scan (07/30/2021): Faint focus of increased metabolic activity, SUV 3.6, measuring 9 mm at the junction of the pancreatic head and body, suspicious for pancreatic  adenocarcinoma.  No obvious extrapancreatic spread of tumor.  Cystic lesion along the posterior margin of the stomach and anterior margin of the pancreatic head favored pseudocyst.  Small amount of pelvic ascites. - MRI of the abdomen pancreatic protocol (08/03/2021): Similar appearance of the pancreas, with abrupt transition from dilated to decompressed duct in the pancreatic head with no evidence of underlying mass.  Pseudocyst enlarged position between pancreatic head and GE junction with significant mass effect upon the pyloric region and gastric outlet obstruction radiologically.  No evidence of abdominal metastatic disease. - FOLFOX on 09/16/2021, FOLFIRINOX cycle 2 on 09/30/2021 - 02/15/2022: Staging laparoscopy, exploratory laparotomy.  Whipple's aborted due to extensive thick scarring with obliteration of tissue planes preventing safe dissection of SMV. - XRT with Xeloda started on 04/05/2022, Xeloda dose reduced to 1000 mg twice daily on 04/20/2022 due to grade 1 HFSR, XRT completed on 05/18/2022   2. Social/family history: - She is accompanied by her son [name] today.  She lives at home by herself.  She is independent of ADLs and IADLs.  She did office work prior to retirement.  Non-smoker and nonalcoholic. - Brother (alcoholic) and maternal half uncle had pancreatitis.  No history of malignancy.  3. Pulmonary embolism: - She was diagnosed with pulmonary embolism in January 2023 and then will hospital.  There does not appear to be any provoking factors for PE.  Reportedly Doppler was negative for DVT.  She was treated with Eliquis for 60 days, discontinued after that.    Plan: 1. Stage I (T1 N0 MX) pancreatic adenocarcinoma: - She reports that her eating is gradually improving.  She is cutting back on fatty foods. - Labs from 09/30/2022: CA 19-9 is 32.  LFTs are normal except mildly elevated  alk phos. - Reviewed CTAP from 09/30/2022: Dilated main pancreatic duct measuring 10 mm, mildly progressive.   Suspected 1.8 cm pancreatic head mass.  No other evidence of metastatic disease. - Recommend PET CT scan and MRI of the abdomen. - Per patient request, will send her for second opinion at [facility].   2.  Iron deficiency anemia: - Ferritin is 21 and hemoglobin decreased to 9.5. - We will schedule her for Feraheme weekly x 2.  We discussed side effects including rare chance of anaphylactic reaction.  We will schedule for him after the MRI.     Orders Placed This Encounter  Procedures   MR Abdomen W Wo Contrast    Next available    Standing Status:   Future    Standing Expiration Date:   10/06/2023    Order Specific Question:   If indicated for the ordered procedure, I authorize the administration of contrast media per Radiology protocol    Answer:   Yes    Order Specific Question:   What is the patient's sedation requirement?    Answer:   No Sedation    Order Specific Question:   Does the patient have a pacemaker or implanted devices?    Answer:   No    Order Specific Question:   Preferred imaging location?    Answer:   [facility] (table limit [phone])    Order Specific Question:   Release to patient    Answer:   Immediate   NM PET Image Restag (PS) Skull Base To Thigh    Next available    Standing Status:   Future    Standing Expiration Date:   10/06/2023    Order Specific Question:   If indicated for the ordered procedure, I authorize the administration of a radiopharmaceutical per Radiology protocol    Answer:   Yes    Order Specific Question:   Preferred imaging location?    Answer:   [facility]    Order Specific Question:   Release to patient    Answer:   Immediate      I,Katie Daubenspeck,acting as a scribe for Doreatha Massed, MD.,have documented all relevant documentation on the behalf of Doreatha Massed, MD,as directed by  Doreatha Massed, MD while in the presence of Doreatha Massed, MD.   I, Doreatha Massed MD, have reviewed the above  documentation for accuracy and completeness, and I agree with the above.   Doreatha Massed, MD   9/11/20244:39 PM  CHIEF COMPLAINT:   Diagnosis: pancreatic cancer    Cancer Staging  Malignant neoplasm of pancreas [facility]) Staging form: Exocrine Pancreas, AJCC 8th Edition - Clinical stage from 07/29/2021: Stage IA (cT1, cN0, cM0) - Unsigned    Prior Therapy: 1. FOLFIRINOX 2.  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 VUS in ATM called c.4367G>A and in [facility] called c.238G>A also identified. The remainder of testing was negative/normal. The report date is 09/20/2021.  The CancerNext-Expanded +  RNAinsight gene panel offered by [employer] and includes sequencing and rearrangement analysis for the following 77 genes: IP, ALK, APC*, ATM*, AXIN2, BAP1, BARD1, BLM, BMPR1A, BRCA1*, BRCA2*, BRIP1*, CDC73, CDH1*,CDK4, CDKN1B, CDKN2A, CHEK2*, CTNNA1, DICER1, FANCC, FH, FLCN, GALNT12, KIF1B, LZTR1, MAX, MEN1, MET, MLH1*, MSH2*, MSH3, MSH6*, MUTYH*, NBN, NF1*, NF2, NTHL1, PALB2*, PHOX2B, PMS2*, POT1, PRKAR1A, PTCH1, PTEN*, RAD51C*, RAD51D*,RB1, RECQL, RET, SDHA, SDHAF2, SDHB, SDHC, SDHD, SMAD4, SMARCA4, SMARCB1, SMARCE1, STK11, SUFU, TMEM127, TP53*,TSC1, TSC2, VHL and XRCC2 (sequencing and deletion/duplication); EGFR, EGLN1, HOXB13, KIT, MITF, PDGFRA, POLD1 and POLE (sequencing only); EPCAM and GREM1 (deletion/duplication only).      INTERVAL HISTORY:   Jesus is a  77 y.o. female presenting to clinic today for follow up of pancreatic cancer. She was last seen by me on 05/11/22.  Today, she states that she is doing well overall. Her appetite level is at 50 %. Her energy level is at 50 %.  PAST MEDICAL HISTORY:   Past Medical History: Past Medical History:  Diagnosis Date   Anxiety    Arthritis    Depression    Dysrhythmia    hx palpitations greater than 5 yrs -neg echo, stress per pt ? where or dr   GERD (gastroesophageal reflux disease)    occ   Nausea & vomiting 08/24/2021   Pancreatic adenocarcinoma (HCC)    Pancreatic pseudocyst    Port-A-Cath in place 09/15/2021   Pulmonary embolism [facility])    September 2022    Surgical History: Past Surgical History:  Procedure Laterality Date   ANTERIOR LAT LUMBAR FUSION Right 12/16/2015   Procedure: RIGHT LUMBAR TWO-THREE, LUMBAR THREE-FOUR, LUMBAR FOUR-FIVE ANTEROLATERAL LUMBAR INTERBODY FUSION;  Surgeon: Maeola Harman, MD;  Location: [facility] OR;  Service: Neurosurgery;  Laterality: Right;   BALLOON DILATION N/A 08/27/2021   Procedure: BALLOON DILATION;  Surgeon: Meridee Score Netty Starring., MD;  Location: [facility] ENDOSCOPY;  Service: Gastroenterology;  Laterality: N/A;   BIOPSY  07/09/2021   Procedure: BIOPSY;  Surgeon: Meridee Score Netty Starring., MD;  Location: [facility] ENDOSCOPY;  Service: Gastroenterology;;   CYST GASTROSTOMY  08/27/2021   Procedure: CYST GASTROSTOMY;  Surgeon: Lemar Lofty., MD;  Location: [facility] ENDOSCOPY;  Service: Gastroenterology;;   ESOPHAGOGASTRODUODENOSCOPY N/A 07/09/2021   Procedure: ESOPHAGOGASTRODUODENOSCOPY (EGD);  Surgeon: Lemar Lofty., MD;  Location: [facility] ENDOSCOPY;  Service: Gastroenterology;  Laterality: N/A;   ESOPHAGOGASTRODUODENOSCOPY (EGD) WITH PROPOFOL N/A 08/27/2021   Procedure: ESOPHAGOGASTRODUODENOSCOPY (EGD) WITH PROPOFOL;  Surgeon: Meridee Score Netty Starring., MD;  Location: [facility] ENDOSCOPY;  Service: Gastroenterology;  Laterality: N/A;   ESOPHAGOGASTRODUODENOSCOPY (EGD) WITH  PROPOFOL N/A 10/08/2021   Procedure: ESOPHAGOGASTRODUODENOSCOPY (EGD) WITH PROPOFOL;  Surgeon: Meridee Score Netty Starring., MD;  Location: [facility] ENDOSCOPY;  Service: Gastroenterology;  Laterality: N/A;   EUS N/A 07/09/2021   Procedure: UPPER ENDOSCOPIC ULTRASOUND (EUS) RADIAL;  Surgeon: Lemar Lofty., MD;  Location: [facility] ENDOSCOPY;  Service: Gastroenterology;  Laterality: N/A;   EUS N/A 08/27/2021   Procedure: UPPER ENDOSCOPIC ULTRASOUND (EUS) LINEAR;  Surgeon: Lemar Lofty., MD;  Location: [facility] ENDOSCOPY;  Service: Gastroenterology;  Laterality: N/A;   FINE NEEDLE ASPIRATION  07/09/2021   Procedure: FINE NEEDLE ASPIRATION (FNA) LINEAR;  Surgeon: Lemar Lofty., MD;  Location: [facility] ENDOSCOPY;  Service: Gastroenterology;;   IR IMAGING GUIDED PORT INSERTION  09/14/2021   LAPAROSCOPY N/A 02/15/2022   Procedure: STAGING DIAGNOSTIC;  Surgeon: Fritzi Mandes, MD;  Location: [facility] OR;  Service: General;  Laterality: N/A;   LUMBAR PERCUTANEOUS PEDICLE SCREW 3 LEVEL Bilateral 12/16/2015   Procedure: PERCUTANEOUS PEDICLE SCREWS BILATERALLY  AT LUMBAR TWO-FIVE;  Surgeon: Maeola Harman, MD;  Location: [facility] OR;  Service: Neurosurgery;  Laterality: Bilateral;   PANCREATIC STENT PLACEMENT  08/27/2021   Procedure: PANCREATIC STENT PLACEMENT;  Surgeon: Lemar Lofty., MD;  Location: [facility] ENDOSCOPY;  Service: Gastroenterology;;   Francine Graven REMOVAL  10/08/2021   Procedure: STENT REMOVAL;  Surgeon: Lemar Lofty., MD;  Location: [facility] ENDOSCOPY;  Service: Gastroenterology;;   TUBAL LIGATION  1974   WHIPPLE PROCEDURE N/A 02/15/2022   Procedure: ATTEMPTED WHIPPLE PROCEDURE;  Surgeon: Fritzi Mandes, MD;  Location: [facility] OR;  Service: General;  Laterality: N/A;    Social History: Social History   Socioeconomic History   Marital status: Widowed    Spouse name: Not on file   Number of children: Not on file   Years of education: Not on file   Highest education level: Not on file  Occupational History    Not on file  Tobacco Use   Smoking status: Never   Smokeless tobacco: Never  Vaping Use   Vaping status: Never Used  Substance and Sexual Activity   Alcohol use: No   Drug use: Never   Sexual activity: Not on file  Other Topics Concern   Not on file  Social History Narrative   Not on file   Social Determinants of Health   Financial Resource Strain: Not on file  Food Insecurity: Not on file  Transportation Needs: Not on file  Physical Activity: Not on file  Stress: Not on file  Social Connections: Not on file  Intimate Partner Violence: Not on file    Family History: Family History  Problem Relation Age of Onset   Pancreatitis Brother        alcoholic pancreatitis   Pancreatic cancer Neg Hx    Colon cancer Neg Hx     Current Medications:  Current Outpatient Medications:    acetaminophen (TYLENOL) 500 MG tablet, Take 2 tablets (1,000 mg total) by mouth every 8 (eight) hours as needed for moderate pain, headache or fever. (Patient taking differently: Take 750-1,000 mg by mouth every 8 (eight) hours as needed for moderate pain, headache or fever.), Disp: , Rfl:    Aromatic Inhalants (VICKS VAPOINHALER) INHA, Inhale 1 puff into the lungs daily as needed (congestion)., Disp: , Rfl:    betamethasone dipropionate 0.05 % cream, Apply topically 2 (two) times daily., Disp: 30 g, Rfl: 3   buPROPion (WELLBUTRIN XL) 300 MG 24 hr tablet, Take 300 mg by mouth daily after breakfast. Takes differently from prescribed: 20mg  BID, Disp: , Rfl:    busPIRone (BUSPAR) 10 MG tablet, Take 20 mg by mouth 3 (three) times daily., Disp: , Rfl:    calcium carbonate (TUMS - DOSED IN MG ELEMENTAL CALCIUM) 500 MG chewable tablet, Chew 1 tablet (200 mg of elemental calcium total) by mouth 2 (two) times daily as needed for indigestion or heartburn. (Patient taking differently: Chew 1-2 tablets by mouth 2 (two) times daily as needed for indigestion or heartburn.), Disp: , Rfl:    Cholecalciferol (VITAMIN D3)  125 MCG (5000 UT) CAPS, Take 5,000 Units by mouth every other day., Disp: , Rfl:    escitalopram (LEXAPRO) 20 MG tablet, Take 20 mg by mouth at bedtime., Disp: , Rfl:    fluorouracil CALGB 44034 1,920 mg/m2 in sodium chloride 0.9 % 150 mL, Inject 1,920 mg/m2 into the vein over 48 hr., Disp: , Rfl:    lactose free nutrition (BOOST) LIQD, Take 237 mLs by mouth 2 (two) times  daily between meals., Disp: , Rfl:    lidocaine-prilocaine (EMLA) cream, Apply a small amount to port a cath site (do not rub in) and cover with plastic wrap 1 hour prior to infusion appointments, Disp: 30 g, Rfl: 3   linaclotide (LINZESS) 72 MCG capsule, Take 1 capsule (72 mcg total) by mouth daily before breakfast., Disp: 30 capsule, Rfl: 12   methocarbamol (ROBAXIN) 500 MG tablet, Take 1 tablet (500 mg total) by mouth every 8 (eight) hours as needed for muscle spasms., Disp: 15 tablet, Rfl: 0   metoprolol succinate (TOPROL-XL) 25 MG 24 hr tablet, Take 0.5 tablets (12.5 mg total) by mouth daily after breakfast., Disp: 14 tablet, Rfl: 0   mirtazapine (REMERON) 30 MG tablet, Take 30 mg by mouth at bedtime., Disp: , Rfl:    omeprazole (PRILOSEC) 40 MG capsule, Take 1 capsule (40 mg total) by mouth daily., Disp: 30 capsule, Rfl: 12   ondansetron (ZOFRAN-ODT) 8 MG disintegrating tablet, Take 1 tablet (8 mg total) by mouth every 8 (eight) hours as needed for nausea or vomiting., Disp: 30 tablet, Rfl: 0   Polyethyl Glycol-Propyl Glycol (SYSTANE OP), Place 1 drop into both eyes at bedtime., Disp: , Rfl:    potassium chloride SA (KLOR-CON M) 20 MEQ tablet, One po bid x 3 days, then one po once a day (Patient taking differently: Take 20 mEq by mouth daily. One po bid x 3 days, then one po once a day), Disp: 20 tablet, Rfl: 0   simethicone (MYLICON) 80 MG chewable tablet, Chew 1 tablet (80 mg total) by mouth 4 (four) times daily., Disp: 30 tablet, Rfl: 0   vitamin B-12 (CYANOCOBALAMIN) 500 MCG tablet, Take 1 tablet (500 mcg total) by mouth  daily., Disp: 30 tablet, Rfl: 2   Allergies: Allergies  Allergen Reactions   Other Hives and Other (See Comments)    Cherry wood just cut- "smelled it and broke out"; cannot tolerate ANY cherry fragrances, either      Cherry Hives   Wound Dressing Adhesive Rash and Other (See Comments)    Band-Aids = local reaction    REVIEW OF SYSTEMS:   Review of Systems  Constitutional:  Negative for chills and fever.  HENT:   Negative for lump/mass, mouth sores, nosebleeds, sore throat and trouble swallowing.   Eyes:  Negative for eye problems.  Respiratory:  Positive for shortness of breath. Negative for cough.   Cardiovascular:  Negative for chest pain, leg swelling and palpitations.  Gastrointestinal:  Negative for abdominal pain, constipation, diarrhea and vomiting.  Genitourinary:  Negative for bladder incontinence, difficulty urinating, dysuria, frequency, hematuria and nocturia.   Musculoskeletal:  Negative for arthralgias, back pain, flank pain, myalgias and neck pain.  Skin:  Negative for itching and rash.  Neurological:  Positive for dizziness and numbness. Negative for headaches.  Hematological:  Does not bruise/bleed easily.  Psychiatric/Behavioral:  Positive for depression. Negative for sleep disturbance and suicidal ideas. The patient is nervous/anxious.   All other systems reviewed and are negative.    VITALS:   Blood pressure 113/66, pulse 81, temperature 98.4 F (36.9 C), temperature source Oral, resp. rate 18, weight 129 lb 3.2 oz (58.6 kg), SpO2 98%.  Wt Readings from Last 3 Encounters:  10/06/22 129 lb 3.2 oz (58.6 kg)  06/29/22 126 lb (57.2 kg)  05/18/22 121 lb 3.2 oz (55 kg)    Body mass index is 22.89 kg/m.  Performance status (ECOG): 1 - Symptomatic but completely ambulatory  PHYSICAL EXAM:  Physical Exam Vitals and nursing note reviewed. Exam conducted with a chaperone present.  Constitutional:      Appearance: Normal appearance.  Cardiovascular:      Rate and Rhythm: Normal rate and regular rhythm.     Pulses: Normal pulses.     Heart sounds: Normal heart sounds.  Pulmonary:     Effort: Pulmonary effort is normal.     Breath sounds: Normal breath sounds.  Abdominal:     Palpations: Abdomen is soft. There is no hepatomegaly, splenomegaly or mass.     Tenderness: There is no abdominal tenderness.  Musculoskeletal:     Right lower leg: No edema.     Left lower leg: No edema.  Lymphadenopathy:     Cervical: No cervical adenopathy.     Right cervical: No superficial, deep or posterior cervical adenopathy.    Left cervical: No superficial, deep or posterior cervical adenopathy.     Upper Body:     Right upper body: No supraclavicular or axillary adenopathy.     Left upper body: No supraclavicular or axillary adenopathy.  Neurological:     General: No focal deficit present.     Mental Status: She is alert and oriented to person, place, and time.  Psychiatric:        Mood and Affect: Mood normal.        Behavior: Behavior normal.     LABS:      Latest Ref Rng & Units 09/30/2022    1:00 PM 06/22/2022   11:05 AM 05/18/2022    2:36 PM  CBC  WBC 4.0 - 10.5 K/uL 4.1  3.8  2.8   Hemoglobin 12.0 - 15.0 g/dL 9.5  10.2  72.5   Hematocrit 36.0 - 46.0 % 29.5  32.8  31.7   Platelets 150 - 400 K/uL 240  136  173       Latest Ref Rng & Units 09/30/2022    1:00 PM 06/22/2022   11:05 AM 05/18/2022    2:36 PM  CMP  Glucose 70 - 99 mg/dL 94  366  440   BUN 8 - 23 mg/dL 28  32  25   Creatinine 0.44 - 1.00 mg/dL 3.47  4.25  9.56   Sodium 135 - 145 mmol/L 134  137  136   Potassium 3.5 - 5.1 mmol/L 4.0  4.0  4.0   Chloride 98 - 111 mmol/L 102  106  104   CO2 22 - 32 mmol/L 25  21  25    Calcium 8.9 - 10.3 mg/dL 8.4  9.0  8.9   Total Protein 6.5 - 8.1 g/dL 6.5  6.4  6.3   Total Bilirubin 0.3 - 1.2 mg/dL 0.6  0.4  0.6   Alkaline Phos 38 - 126 U/L 129  104  95   AST 15 - 41 U/L 31  30  32   ALT 0 - 44 U/L 29  29  23       No results found  for: "CEA1", "CEA" / No results found for: "CEA1", "CEA" No results found for: "PSA1" Lab Results  Component Value Date   LOV564 32 09/30/2022   No results found for: "CAN125"  No results found for: "TOTALPROTELP", "ALBUMINELP", "A1GS", "A2GS", "BETS", "BETA2SER", "GAMS", "MSPIKE", "SPEI" Lab Results  Component Value Date   TIBC 376 09/30/2022   TIBC 154 (L) 07/14/2021   FERRITIN 21 09/30/2022   FERRITIN 273 07/14/2021   IRONPCTSAT 22 09/30/2022   IRONPCTSAT 11 07/14/2021  No results found for: "LDH"   STUDIES:   CT Abdomen Pelvis W Wo Contrast  Result Date: 10/04/2022 CLINICAL DATA:  Follow-up pancreatic cancer, status post radiation and chemotherapy EXAM: CT ABDOMEN AND PELVIS WITHOUT AND WITH CONTRAST TECHNIQUE: Multidetector CT imaging of the abdomen and pelvis was performed following the standard protocol before and following the bolus administration of intravenous contrast. RADIATION DOSE REDUCTION: This exam was performed according to the departmental dose-optimization program which includes automated exposure control, adjustment of the mA and/or kV according to patient size and/or use of iterative reconstruction technique. CONTRAST:  OMNIPAQUE IOHEXOL 300 MG/ML  SOLN COMPARISON:  06/22/2022 FINDINGS: Lower chest: Lung bases are clear. Hepatobiliary: Small hepatic cysts measuring up to 7 mm (series 5/image 22). Status post cholecystectomy. No intrahepatic or extrahepatic ductal dilatation. Pancreas: Atrophy of the pancreatic body/tender. Dilated main pancreatic duct, measuring up to 10 mm, mildly progressive. Abrupt cutoff in the region of the pancreatic head with suspected 18 x 18 mm pancreatic head mass (series 6/image 27), new. Spleen: Within normal limits. Dominant splenule in the left upper abdomen. Adrenals/Urinary Tract: Adrenal glands are within normal limits. Bilateral renal sinus cysts, benign. 10 mm cortical cyst in the posterior right lower kidney (series 6/image 33),  benign (Bosniak I). No follow-up is recommended. No renal calculi or hydronephrosis. Bladder is within normal limits. Stomach/Bowel: Stomach is within normal limits. No evidence of bowel obstruction. Normal appendix (series 5/image 85). No colonic wall thickening or inflammatory changes. Vascular/Lymphatic: No evidence of abdominal aortic aneurysm. Atherosclerotic calcifications of the abdominal aorta and branch vessels, although vessels remain patent. The lymph nodes Reproductive: Uterus is within normal limits. Bilateral ovaries are within normal limits. Other: No abdominopelvic ascites. Musculoskeletal: Status post PLIF at L2-5. Degenerative changes of the visualized thoracolumbar spine. IMPRESSION: Suspected 18 mm pancreatic head mass, new, with progressive pancreatic ductal dilatation. This appearance raises concern for residual/recurrent pancreatic adenocarcinoma. No evidence of metastatic disease in the abdomen/pelvis. Electronically Signed   By: Charline Bills M.D.   On: 10/04/2022 10:08

## 2022-10-08 ENCOUNTER — Other Ambulatory Visit: Payer: Self-pay

## 2022-10-09 ENCOUNTER — Other Ambulatory Visit: Payer: Self-pay | Admitting: Gastroenterology

## 2022-10-11 NOTE — Telephone Encounter (Signed)
Needs office visit.

## 2022-10-18 ENCOUNTER — Other Ambulatory Visit: Payer: Self-pay | Admitting: Hematology

## 2022-10-18 ENCOUNTER — Ambulatory Visit (HOSPITAL_COMMUNITY)
Admission: RE | Admit: 2022-10-18 | Discharge: 2022-10-18 | Disposition: A | Discharge status: Home / Self Care | Payer: Medicare Other | Source: Ambulatory Visit | Attending: Hematology | Admitting: Hematology

## 2022-10-18 DIAGNOSIS — C250 Malignant neoplasm of head of pancreas: Secondary | ICD-10-CM

## 2022-10-18 MED ORDER — GADOBUTROL 1 MMOL/ML IV SOLN
5.0000 mL | Freq: Once | INTRAVENOUS | Status: AC | PRN
  Administered 2022-10-18: 5 mL via INTRAVENOUS

## 2022-10-19 ENCOUNTER — Inpatient Hospital Stay: Payer: Medicare Other

## 2022-10-19 ENCOUNTER — Other Ambulatory Visit: Payer: Self-pay | Admitting: Gastroenterology

## 2022-10-19 VITALS — BP 122/64 | HR 74 | Temp 97.6°F | Resp 18

## 2022-10-19 DIAGNOSIS — D509 Iron deficiency anemia, unspecified: Secondary | ICD-10-CM

## 2022-10-19 DIAGNOSIS — C253 Malignant neoplasm of pancreatic duct: Secondary | ICD-10-CM | POA: Diagnosis not present

## 2022-10-19 MED ORDER — CETIRIZINE HCL 10 MG PO TABS
10.0000 mg | ORAL_TABLET | Freq: Once | ORAL | Status: AC
  Administered 2022-10-19: 10 mg via ORAL
  Filled 2022-10-19: qty 1

## 2022-10-19 MED ORDER — SODIUM CHLORIDE 0.9 % IV SOLN: Freq: Once | INTRAVENOUS | Status: AC

## 2022-10-19 MED ORDER — HEPARIN SOD (PORK) LOCK FLUSH 100 UNIT/ML IV SOLN
500.0000 [IU] | Freq: Once | INTRAVENOUS | Status: AC | PRN
  Administered 2022-10-19: 500 [IU]

## 2022-10-19 MED ORDER — SODIUM CHLORIDE 0.9 % IV SOLN
510.0000 mg | Freq: Once | INTRAVENOUS | Status: AC
  Administered 2022-10-19: 510 mg via INTRAVENOUS
  Filled 2022-10-19: qty 17

## 2022-10-19 MED ORDER — ACETAMINOPHEN 325 MG PO TABS
650.0000 mg | ORAL_TABLET | Freq: Once | ORAL | Status: AC
  Administered 2022-10-19: 650 mg via ORAL
  Filled 2022-10-19: qty 2

## 2022-10-19 NOTE — Patient Instructions (Signed)
[facility]  Discharge Instructions: Thank you for choosing [facility] to provide your oncology and hematology care.  If you have a lab appointment with the Cancer Center - please note that after April 8th, 2024, all labs will be drawn in the cancer center.  You do not have to check in or register with the main entrance as you have in the past but will complete your check-in in the cancer center.  Wear comfortable clothing and clothing appropriate for easy access to any Portacath or PICC line.   We strive to give you quality time with your provider. You may need to reschedule your appointment if you arrive late (15 or more minutes).  Arriving late affects you and other patients whose appointments are after yours.  Also, if you miss three or more appointments without notifying the office, you may be dismissed from the clinic at the provider's discretion.      For prescription refill requests, have your pharmacy contact our office and allow 72 hours for refills to be completed.    Today you received the following Feraheme infusion.  Ferumoxytol Injection What is this medication? FERUMOXYTOL (FER ue MOX i tol) treats low levels of iron in your body (iron deficiency anemia). Iron is a mineral that plays an important role in making red blood cells, which carry oxygen from your lungs to the rest of your body. This medicine may be used for other purposes; ask your health care provider or pharmacist if you have questions. COMMON BRAND NAME(S): Feraheme What should I tell my care team before I take this medication? They need to know if you have any of these conditions: Anemia not caused by low iron levels High levels of iron in the blood Magnetic resonance imaging (MRI) test scheduled An unusual or allergic reaction to iron, other medications, foods, dyes, or preservatives Pregnant or trying to get pregnant Breastfeeding How should I use this medication? This  medication is injected into a vein. It is given by your care team in a hospital or clinic setting. Talk to your care team the use of this medication in children. Special care may be needed. Overdosage: If you think you have taken too much of this medicine contact a poison control center or emergency room at once. NOTE: This medicine is only for you. Do not share this medicine with others. What if I miss a dose? It is important not to miss your dose. Call your care team if you are unable to keep an appointment. What may interact with this medication? Other iron products This list may not describe all possible interactions. Give your health care provider a list of all the medicines, herbs, non-prescription drugs, or dietary supplements you use. Also tell them if you smoke, drink alcohol, or use illegal drugs. Some items may interact with your medicine. What should I watch for while using this medication? Visit your care team regularly. Tell your care team if your symptoms do not start to get better or if they get worse. You may need blood work done while you are taking this medication. You may need to follow a special diet. Talk to your care team. Foods that contain iron include: whole grains/cereals, dried fruits, beans, or peas, leafy green vegetables, and organ meats (liver, kidney). What side effects may I notice from receiving this medication? Side effects that you should report to your care team as soon as possible: Allergic reactions--skin rash, itching, hives, swelling of the  face, lips, tongue, or throat Low blood pressure--dizziness, feeling faint or lightheaded, blurry vision Shortness of breath Side effects that usually do not require medical attention (report to your care team if they continue or are bothersome): Flushing Headache Joint pain Muscle pain Nausea Pain, redness, or irritation at injection site This list may not describe all possible side effects. Call your doctor for  medical advice about side effects. You may report side effects to FDA at 1-800-FDA-1088. Where should I keep my medication? This medication is given in a hospital or clinic. It will not be stored at home. NOTE: This sheet is a summary. It may not cover all possible information. If you have questions about this medicine, talk to your doctor, pharmacist, or health care provider.  2024 Elsevier/Gold Standard (2022-06-18 00:00:00)   To help prevent nausea and vomiting after your treatment, we encourage you to take your nausea medication as directed.  BELOW ARE SYMPTOMS THAT SHOULD BE REPORTED IMMEDIATELY: *FEVER GREATER THAN 100.4 F (38 C) OR HIGHER *CHILLS OR SWEATING *NAUSEA AND VOMITING THAT IS NOT CONTROLLED WITH YOUR NAUSEA MEDICATION *UNUSUAL SHORTNESS OF BREATH *UNUSUAL BRUISING OR BLEEDING *URINARY PROBLEMS (pain or burning when urinating, or frequent urination) *BOWEL PROBLEMS (unusual diarrhea, constipation, pain near the anus) TENDERNESS IN MOUTH AND THROAT WITH OR WITHOUT PRESENCE OF ULCERS (sore throat, sores in mouth, or a toothache) UNUSUAL RASH, SWELLING OR PAIN  UNUSUAL VAGINAL DISCHARGE OR ITCHING   Items with * indicate a potential emergency and should be followed up as soon as possible or go to the Emergency Department if any problems should occur.  Please show the CHEMOTHERAPY ALERT CARD or IMMUNOTHERAPY ALERT CARD at check-in to the Emergency Department and triage nurse.  Should you have questions after your visit or need to cancel or reschedule your appointment, please contact [facility] [phone]  and follow the prompts.  Office hours are 8:00 a.m. to 4:30 p.m. Monday - Friday. Please note that voicemails left after 4:00 p.m. may not be returned until the following business day.  We are closed weekends and major holidays. You have access to a nurse at all times for urgent questions. Please call the main number to the clinic [phone] and  follow the prompts.  For any non-urgent questions, you may also contact your provider using MyChart. We now offer e-Visits for anyone 48 and older to request care online for non-urgent symptoms. For details visit [email or web address].   Also download the MyChart app! Go to the app store, search "MyChart", open the app, select , and log in with your MyChart username and password.

## 2022-10-19 NOTE — Progress Notes (Signed)
Patient presents today for iron infusion of Feraheme.  Patient is in satisfactory condition with no new complaints voiced.  Vital signs are stable.  We will proceed with infusion per provider orders.    Patient tolerated treatment well with no complaints voiced.  Patient left ambulatory in stable condition.  Vital signs stable at discharge.  Follow up as scheduled.

## 2022-10-28 ENCOUNTER — Encounter (HOSPITAL_COMMUNITY)
Admission: RE | Admit: 2022-10-28 | Discharge: 2022-10-28 | Disposition: A | Discharge status: Home / Self Care | Payer: Medicare Other | Source: Ambulatory Visit | Attending: Hematology | Admitting: Hematology

## 2022-10-28 DIAGNOSIS — C250 Malignant neoplasm of head of pancreas: Secondary | ICD-10-CM | POA: Insufficient documentation

## 2022-10-28 MED ORDER — FLUDEOXYGLUCOSE F - 18 (FDG) INJECTION
6.6700 | Freq: Once | INTRAVENOUS | Status: AC | PRN
  Administered 2022-10-28: 6.67 via INTRAVENOUS

## 2022-10-29 ENCOUNTER — Inpatient Hospital Stay: Payer: Medicare Other | Attending: Hematology

## 2022-10-29 VITALS — BP 91/52 | HR 74 | Temp 97.7°F | Resp 18 | Wt 130.0 lb

## 2022-10-29 DIAGNOSIS — C253 Malignant neoplasm of pancreatic duct: Secondary | ICD-10-CM | POA: Insufficient documentation

## 2022-10-29 DIAGNOSIS — Z86711 Personal history of pulmonary embolism: Secondary | ICD-10-CM | POA: Diagnosis not present

## 2022-10-29 DIAGNOSIS — D509 Iron deficiency anemia, unspecified: Secondary | ICD-10-CM | POA: Insufficient documentation

## 2022-10-29 MED ORDER — SODIUM CHLORIDE 0.9 % IV SOLN: Freq: Once | INTRAVENOUS | Status: AC

## 2022-10-29 MED ORDER — CETIRIZINE HCL 10 MG PO TABS
10.0000 mg | ORAL_TABLET | Freq: Once | ORAL | Status: AC
  Administered 2022-10-29: 10 mg via ORAL
  Filled 2022-10-29: qty 1

## 2022-10-29 MED ORDER — SODIUM CHLORIDE 0.9 % IV SOLN
510.0000 mg | Freq: Once | INTRAVENOUS | Status: AC
  Administered 2022-10-29: 510 mg via INTRAVENOUS
  Filled 2022-10-29: qty 510

## 2022-10-29 MED ORDER — HEPARIN SOD (PORK) LOCK FLUSH 100 UNIT/ML IV SOLN
500.0000 [IU] | Freq: Once | INTRAVENOUS | Status: AC
  Administered 2022-10-29: 500 [IU] via INTRAVENOUS

## 2022-10-29 NOTE — Progress Notes (Signed)
Patient took tylenol from home

## 2022-10-29 NOTE — Patient Instructions (Signed)

## 2022-11-02 ENCOUNTER — Inpatient Hospital Stay (HOSPITAL_BASED_OUTPATIENT_CLINIC_OR_DEPARTMENT_OTHER): Payer: Medicare Other | Admitting: Hematology

## 2022-11-02 VITALS — BP 128/77 | HR 84 | Temp 98.1°F | Resp 18 | Wt 129.7 lb

## 2022-11-02 DIAGNOSIS — C250 Malignant neoplasm of head of pancreas: Secondary | ICD-10-CM

## 2022-11-02 DIAGNOSIS — D509 Iron deficiency anemia, unspecified: Secondary | ICD-10-CM

## 2022-11-02 NOTE — Progress Notes (Signed)
[facility] [address] [place] [address]    Clinic Day:  11/02/2022  Referring physician: [name], DO  Patient Care Team: [name], DO as PCP - General (Family Medicine) Jena Gauss, Gerrit Friends, MD as Consulting Physician (Gastroenterology) Doreatha Massed, MD as Medical Oncologist (Medical Oncology) Therese Sarah, RN as Oncology Nurse Navigator (Medical Oncology)   ASSESSMENT & PLAN:   Assessment: 1. 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 - EGD/EUS (07/09/2021) by Dr. Meridee Score: The region where the PD is strictured, darker appearance was identified.  There was no clear hypoechoic mass.  Endosonographic appearance suggestive of either chronic pancreatitis changes versus possible malignancy.  Endosonographic staging T1 N0 MX.  Pancreatic parenchymal abnormalities consisting of atrophy and hyperechoic strands.  No sign of significant pathology in the CBD.  No malignant appearing lymph nodes in the celiac region, peripancreatic region and porta hepatic region - Pathology (pancreatic duct FNA): Malignant cells consistent with adenocarcinoma - CA 19-9: 31 (0-35) - CTAP (07/13/2021): Fluid collection extending posterior to the gastric antrum and ventral to the pancreas.  Elongated collection extends to the greater curvature of the stomach with inflammation surrounding the collection suggestive of a pancreatic pseudocyst.  Chronic dilatation of pancreatic duct with abrupt termination of the duct dilatation in the head of the pancreas. - CT chest (07/16/2021): No evidence of metastatic disease in the chest. - 8 to 9 pound weight loss in the last 3 months, decreased appetite. - PET scan (07/30/2021): Faint focus of increased metabolic activity, SUV 3.6, measuring 9 mm at the junction of the pancreatic head and body, suspicious for pancreatic  adenocarcinoma.  No obvious extrapancreatic spread of tumor.  Cystic lesion along the posterior margin of the stomach and anterior margin of the pancreatic head favored pseudocyst.  Small amount of pelvic ascites. - MRI of the abdomen pancreatic protocol (08/03/2021): Similar appearance of the pancreas, with abrupt transition from dilated to decompressed duct in the pancreatic head with no evidence of underlying mass.  Pseudocyst enlarged position between pancreatic head and GE junction with significant mass effect upon the pyloric region and gastric outlet obstruction radiologically.  No evidence of abdominal metastatic disease. - FOLFOX on 09/16/2021, FOLFIRINOX cycle 2 on 09/30/2021 - 02/15/2022: Staging laparoscopy, exploratory laparotomy.  Whipple's aborted due to extensive thick scarring with obliteration of tissue planes preventing safe dissection of SMV. - XRT with Xeloda started on 04/05/2022, Xeloda dose reduced to 1000 mg twice daily on 04/20/2022 due to grade 1 HFSR, XRT completed on 05/18/2022   2. Social/family history: - She is accompanied by her son [name] today.  She lives at home by herself.  She is independent of ADLs and IADLs.  She did office work prior to retirement.  Non-smoker and nonalcoholic. - Brother (alcoholic) and maternal half uncle had pancreatitis.  No history of malignancy.  3. Pulmonary embolism: - She was diagnosed with pulmonary embolism in January 2023 and then will hospital.  There does not appear to be any provoking factors for PE.  Reportedly Doppler was negative for DVT.  She was treated with Eliquis for 60 days, discontinued after that.    Plan: 1. Stage I (T1 N0 MX) pancreatic adenocarcinoma: - CTAP from 09/30/2022 showed dilated main pancreatic duct measuring 10 mm, mildly progressive with suspected 1.8 cm pancreatic head mass with no evidence of metastatic disease. - Reviewed MRI of the  abdomen with and without contrast from 10/18/2022: Upstream pancreatic duct  dilatation with atrophy.  Duct dilatation is felt to be mildly progressive from July.  Duct undergoes abrupt transition in the region of the pancreatic head without convincing evidence of underlying mass. - PET scan from 10/28/2022: Mild focal hypermetabolic in the pancreatic head SUV 4.8.  Question Noncontour forming lesion/recurrence as MRI did not show any clear mass. - No evidence of metastatic disease. - She met with Dr. Lattie Corns and Dr. Modesta Messing at [facility].  She has a follow-up with Dr.Lidsky in 6 weeks. - I have recommended follow-up in 3 months with repeat MRI of the abdomen with and without contrast.   2.  Iron deficiency anemia: - She received Feraheme on 10/19/2022 and 10/29/2022. - Plan to repeat CBC, ferritin and iron panel at next visit.     Orders Placed This Encounter  Procedures   MR Abdomen W Wo Contrast    Pancreatic protocol    Standing Status:   Future    Standing Expiration Date:   11/02/2023    Order Specific Question:   If indicated for the ordered procedure, I authorize the administration of contrast media per Radiology protocol    Answer:   Yes    Order Specific Question:   What is the patient's sedation requirement?    Answer:   No Sedation    Order Specific Question:   Does the patient have a pacemaker or implanted devices?    Answer:   No    Order Specific Question:   Preferred imaging location?    Answer:   [facility] (table limit [phone])    Order Specific Question:   Release to patient    Answer:   Immediate   CBC with Differential/Platelet    Standing Status:   Future    Standing Expiration Date:   11/02/2023    Order Specific Question:   Release to patient    Answer:   Immediate   Comprehensive metabolic panel    Standing Status:   Future    Standing Expiration Date:   11/02/2023    Order Specific Question:   Release to patient    Answer:   Immediate   Ferritin    Standing Status:   Future    Standing Expiration Date:   11/02/2023    Order  Specific Question:   Release to patient    Answer:   Immediate   Iron and TIBC    Standing Status:   Future    Standing Expiration Date:   11/02/2023    Order Specific Question:   Release to patient    Answer:   Immediate   Cancer antigen 19-9    Standing Status:   Future    Standing Expiration Date:   11/02/2023     Alben Deeds Teague,acting as a scribe for Doreatha Massed, MD.,have documented all relevant documentation on the behalf of Doreatha Massed, MD,as directed by  Doreatha Massed, MD while in the presence of Doreatha Massed, MD.  I, Doreatha Massed MD, have reviewed the above documentation for accuracy and completeness, and I agree with the above.    Doreatha Massed, MD   10/8/20244:22 PM  CHIEF COMPLAINT:   Diagnosis: pancreatic cancer    Cancer Staging  Malignant neoplasm of pancreas [facility]) Staging form: Exocrine Pancreas, AJCC 8th Edition - Clinical stage from 07/29/2021: Stage IA (cT1, cN0, cM0) - Unsigned    Prior Therapy: 1. FOLFIRINOX 2.  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 VUS in ATM called c.4367G>A and in [facility] called c.238G>A also identified. The remainder of testing was negative/normal. The report date is 09/20/2021.  The CancerNext-Expanded + RNAinsight gene panel offered by [employer] and includes sequencing and rearrangement analysis for the following 77 genes: IP, ALK, APC*, ATM*, AXIN2, BAP1,  BARD1, BLM, BMPR1A, BRCA1*, BRCA2*, BRIP1*, CDC73, CDH1*,CDK4, CDKN1B, CDKN2A, CHEK2*, CTNNA1, DICER1, FANCC, FH, FLCN, GALNT12, KIF1B, LZTR1, MAX, MEN1, MET, MLH1*, MSH2*, MSH3, MSH6*, MUTYH*, NBN, NF1*, NF2, NTHL1, PALB2*, PHOX2B, PMS2*, POT1, PRKAR1A, PTCH1, PTEN*, RAD51C*, RAD51D*,RB1, RECQL, RET, SDHA, SDHAF2, SDHB, SDHC, SDHD, SMAD4, SMARCA4, SMARCB1, SMARCE1, STK11, SUFU, TMEM127, TP53*,TSC1, TSC2, VHL and XRCC2 (sequencing and deletion/duplication); EGFR, EGLN1, HOXB13, KIT, MITF, PDGFRA, POLD1 and POLE (sequencing only); EPCAM and GREM1 (deletion/duplication only).      INTERVAL HISTORY:   Sheryl Bender is a 77 y.o. female presenting to clinic today for follow up of pancreatic cancer. She was last seen by me on 10/06/22.  Since her last visit, she underwent MRI of the abdomen on 10/18/22 that found: upstream pancreatic duct dilatation and atrophy, duct dilatation is felt to be mildly progressive and undergoes an abrupt transition in the region of the pancreatic head, without convincing evidence of underlying mass; no evidence of metastatic disease; and gastric underdistention with possible antral wall thickening.   She also had a restaging PET on 10/28/22 that found: mild focal hypermetabolism in the pancreatic head and no evidence of metastatic disease.   Today, she states that she is doing well overall. Her appetite level is at 60%. Her energy level is at 50%. She is accompanied by her son. She reports a decreased appetite, though she has a normal p.o. intake. She did not notice a difference in energy after receiving IV iron after last visit. She saw Dr. Lattie Corns and Dr. Modesta Messing on 10/25/22. She will see Dr. Modesta Messing 6 weeks after 9/30 visit to assess if she is a candidate for surgical resection.   PAST MEDICAL HISTORY:   Past Medical History: Past Medical History:  Diagnosis Date   Anxiety    Arthritis    Depression    Dysrhythmia    hx palpitations greater than 5 yrs -neg echo, stress per pt ?  where or dr   GERD (gastroesophageal reflux disease)    occ   Nausea & vomiting 08/24/2021   Pancreatic adenocarcinoma (HCC)    Pancreatic pseudocyst    Port-A-Cath in place 09/15/2021   Pulmonary embolism [facility])    September 2022    Surgical History: Past Surgical History:  Procedure Laterality Date   ANTERIOR LAT LUMBAR FUSION Right 12/16/2015   Procedure: RIGHT LUMBAR TWO-THREE, LUMBAR THREE-FOUR, LUMBAR FOUR-FIVE ANTEROLATERAL LUMBAR INTERBODY FUSION;  Surgeon: Maeola Harman, MD;  Location: [facility] OR;  Service: Neurosurgery;  Laterality: Right;   BALLOON DILATION N/A 08/27/2021   Procedure: BALLOON DILATION;  Surgeon: Meridee Score Netty Starring., MD;  Location: [facility] ENDOSCOPY;  Service: Gastroenterology;  Laterality: N/A;   BIOPSY  07/09/2021   Procedure: BIOPSY;  Surgeon: Lemar Lofty., MD;  Location: [facility] ENDOSCOPY;  Service: Gastroenterology;;   CYST GASTROSTOMY  08/27/2021   Procedure: CYST GASTROSTOMY;  Surgeon: Lemar Lofty., MD;  Location: [facility] ENDOSCOPY;  Service: Gastroenterology;;   ESOPHAGOGASTRODUODENOSCOPY N/A 07/09/2021   Procedure: ESOPHAGOGASTRODUODENOSCOPY (EGD);  Surgeon: Lemar Lofty., MD;  Location: [facility] ENDOSCOPY;  Service: Gastroenterology;  Laterality: N/A;   ESOPHAGOGASTRODUODENOSCOPY (EGD) WITH PROPOFOL N/A 08/27/2021   Procedure: ESOPHAGOGASTRODUODENOSCOPY (EGD) WITH PROPOFOL;  Surgeon: Meridee Score Netty Starring., MD;  Location: [facility] ENDOSCOPY;  Service: Gastroenterology;  Laterality: N/A;   ESOPHAGOGASTRODUODENOSCOPY (EGD) WITH PROPOFOL N/A 10/08/2021   Procedure: ESOPHAGOGASTRODUODENOSCOPY (EGD) WITH PROPOFOL;  Surgeon: Meridee Score Netty Starring., MD;  Location: [facility] ENDOSCOPY;  Service: Gastroenterology;  Laterality: N/A;   EUS N/A 07/09/2021   Procedure: UPPER ENDOSCOPIC ULTRASOUND (EUS) RADIAL;  Surgeon: Lemar Lofty., MD;  Location: [facility] ENDOSCOPY;  Service: Gastroenterology;  Laterality: N/A;   EUS N/A 08/27/2021   Procedure: UPPER ENDOSCOPIC  ULTRASOUND (EUS) LINEAR;  Surgeon: Lemar Lofty., MD;  Location: [facility] ENDOSCOPY;  Service: Gastroenterology;  Laterality: N/A;   FINE NEEDLE ASPIRATION  07/09/2021   Procedure: FINE NEEDLE ASPIRATION (FNA) LINEAR;  Surgeon: Lemar Lofty., MD;  Location: [facility] ENDOSCOPY;  Service: Gastroenterology;;   IR IMAGING GUIDED PORT INSERTION  09/14/2021   LAPAROSCOPY N/A 02/15/2022   Procedure: STAGING DIAGNOSTIC;  Surgeon: Fritzi Mandes, MD;  Location: [facility] OR;  Service: General;  Laterality: N/A;   LUMBAR PERCUTANEOUS PEDICLE SCREW 3 LEVEL Bilateral 12/16/2015   Procedure: PERCUTANEOUS PEDICLE SCREWS BILATERALLY AT LUMBAR TWO-FIVE;  Surgeon: Maeola Harman, MD;  Location: [facility] OR;  Service: Neurosurgery;  Laterality: Bilateral;   PANCREATIC STENT PLACEMENT  08/27/2021   Procedure: PANCREATIC STENT PLACEMENT;  Surgeon: Lemar Lofty., MD;  Location: [facility] ENDOSCOPY;  Service: Gastroenterology;;   Francine Graven REMOVAL  10/08/2021   Procedure: STENT REMOVAL;  Surgeon: Lemar Lofty., MD;  Location: [facility] ENDOSCOPY;  Service: Gastroenterology;;   TUBAL LIGATION  1974   WHIPPLE PROCEDURE N/A 02/15/2022   Procedure: ATTEMPTED WHIPPLE PROCEDURE;  Surgeon: Fritzi Mandes, MD;  Location: [facility] OR;  Service: General;  Laterality: N/A;    Social History: Social History   Socioeconomic History   Marital status: Widowed    Spouse name: Not on file   Number of children: Not on file   Years of education: Not on file   Highest education level: Not on file  Occupational History   Not on file  Tobacco Use   Smoking status: Never   Smokeless tobacco: Never  Vaping Use   Vaping status: Never Used  Substance and Sexual Activity   Alcohol use: No   Drug use: Never   Sexual activity: Not on file  Other Topics Concern   Not on file  Social History Narrative   Not on file   Social Determinants of Health   Financial Resource Strain: Not on file  Food Insecurity: Not on file  Transportation Needs:  Not on file  Physical Activity: Not on file  Stress: Not on file  Social Connections: Not on file  Intimate Partner Violence: Not on file    Family History: Family History  Problem Relation Age of Onset   Pancreatitis Brother        alcoholic pancreatitis   Pancreatic cancer Neg Hx    Colon cancer Neg Hx     Current Medications:  Current Outpatient Medications:    acetaminophen (TYLENOL) 500 MG tablet, Take 2 tablets (  1,000 mg total) by mouth every 8 (eight) hours as needed for moderate pain, headache or fever. (Patient taking differently: Take 750-1,000 mg by mouth every 8 (eight) hours as needed for moderate pain, headache or fever.), Disp: , Rfl:    Aromatic Inhalants (VICKS VAPOINHALER) INHA, Inhale 1 puff into the lungs daily as needed (congestion)., Disp: , Rfl:    betamethasone dipropionate 0.05 % cream, Apply topically 2 (two) times daily., Disp: 30 g, Rfl: 3   buPROPion (WELLBUTRIN XL) 300 MG 24 hr tablet, Take 300 mg by mouth daily after breakfast. Takes differently from prescribed: 20mg  BID, Disp: , Rfl:    busPIRone (BUSPAR) 10 MG tablet, Take 20 mg by mouth 3 (three) times daily., Disp: , Rfl:    calcium carbonate (TUMS - DOSED IN MG ELEMENTAL CALCIUM) 500 MG chewable tablet, Chew 1 tablet (200 mg of elemental calcium total) by mouth 2 (two) times daily as needed for indigestion or heartburn. (Patient taking differently: Chew 1-2 tablets by mouth 2 (two) times daily as needed for indigestion or heartburn.), Disp: , Rfl:    Cholecalciferol (VITAMIN D3) 125 MCG (5000 UT) CAPS, Take 5,000 Units by mouth every other day., Disp: , Rfl:    escitalopram (LEXAPRO) 20 MG tablet, Take 20 mg by mouth at bedtime., Disp: , Rfl:    fluorouracil CALGB 57846 1,920 mg/m2 in sodium chloride 0.9 % 150 mL, Inject 1,920 mg/m2 into the vein over 48 hr., Disp: , Rfl:    lactose free nutrition (BOOST) LIQD, Take 237 mLs by mouth 2 (two) times daily between meals., Disp: , Rfl:     lidocaine-prilocaine (EMLA) cream, Apply a small amount to port a cath site (do not rub in) and cover with plastic wrap 1 hour prior to infusion appointments, Disp: 30 g, Rfl: 3   linaclotide (LINZESS) 72 MCG capsule, TAKE 1 CAPSULE BY MOUTH DAILY BEFORE BREAKFAST., Disp: 30 capsule, Rfl: 0   methocarbamol (ROBAXIN) 500 MG tablet, Take 1 tablet (500 mg total) by mouth every 8 (eight) hours as needed for muscle spasms., Disp: 15 tablet, Rfl: 0   metoprolol succinate (TOPROL-XL) 25 MG 24 hr tablet, Take 0.5 tablets (12.5 mg total) by mouth daily after breakfast., Disp: 14 tablet, Rfl: 0   mirtazapine (REMERON) 30 MG tablet, Take 30 mg by mouth at bedtime., Disp: , Rfl:    omeprazole (PRILOSEC) 40 MG capsule, TAKE 1 CAPSULE (40 MG TOTAL) BY MOUTH DAILY., Disp: 90 capsule, Rfl: 5   ondansetron (ZOFRAN-ODT) 8 MG disintegrating tablet, Take 1 tablet (8 mg total) by mouth every 8 (eight) hours as needed for nausea or vomiting., Disp: 30 tablet, Rfl: 0   Polyethyl Glycol-Propyl Glycol (SYSTANE OP), Place 1 drop into both eyes at bedtime., Disp: , Rfl:    potassium chloride SA (KLOR-CON M) 20 MEQ tablet, One po bid x 3 days, then one po once a day (Patient taking differently: Take 20 mEq by mouth daily. One po bid x 3 days, then one po once a day), Disp: 20 tablet, Rfl: 0   simethicone (MYLICON) 80 MG chewable tablet, Chew 1 tablet (80 mg total) by mouth 4 (four) times daily., Disp: 30 tablet, Rfl: 0   vitamin B-12 (CYANOCOBALAMIN) 500 MCG tablet, Take 1 tablet (500 mcg total) by mouth daily., Disp: 30 tablet, Rfl: 2   Allergies: Allergies  Allergen Reactions   Other Hives and Other (See Comments)    Cherry wood just cut- "smelled it and broke out"; cannot tolerate ANY cherry  fragrances, either      Cherry Hives   Wound Dressing Adhesive Rash and Other (See Comments)    Band-Aids = local reaction    REVIEW OF SYSTEMS:   Review of Systems  Constitutional:  Negative for chills, fatigue and fever.   HENT:   Negative for lump/mass, mouth sores, nosebleeds, sore throat and trouble swallowing.   Eyes:  Negative for eye problems.  Respiratory:  Positive for cough and shortness of breath.   Cardiovascular:  Negative for chest pain, leg swelling and palpitations.  Gastrointestinal:  Positive for nausea. Negative for abdominal pain, constipation, diarrhea and vomiting.  Genitourinary:  Negative for bladder incontinence, difficulty urinating, dysuria, frequency, hematuria and nocturia.   Musculoskeletal:  Negative for arthralgias, back pain, flank pain, myalgias and neck pain.  Skin:  Negative for itching and rash.  Neurological:  Positive for dizziness. Negative for headaches and numbness.  Hematological:  Does not bruise/bleed easily.  Psychiatric/Behavioral:  Positive for depression. Negative for sleep disturbance and suicidal ideas. The patient is nervous/anxious.   All other systems reviewed and are negative.    VITALS:   Blood pressure 128/77, pulse 84, temperature 98.1 F (36.7 C), temperature source Oral, resp. rate 18, weight 129 lb 11.2 oz (58.8 kg), SpO2 100%.  Wt Readings from Last 3 Encounters:  11/02/22 129 lb 11.2 oz (58.8 kg)  10/29/22 130 lb (59 kg)  10/06/22 129 lb 3.2 oz (58.6 kg)    Body mass index is 22.98 kg/m.  Performance status (ECOG): 1 - Symptomatic but completely ambulatory  PHYSICAL EXAM:   Physical Exam Vitals and nursing note reviewed. Exam conducted with a chaperone present.  Constitutional:      Appearance: Normal appearance.  Cardiovascular:     Rate and Rhythm: Normal rate and regular rhythm.     Pulses: Normal pulses.     Heart sounds: Normal heart sounds.  Pulmonary:     Effort: Pulmonary effort is normal.     Breath sounds: Normal breath sounds.  Abdominal:     Palpations: Abdomen is soft. There is no hepatomegaly, splenomegaly or mass.     Tenderness: There is no abdominal tenderness.  Musculoskeletal:     Right lower leg: No edema.      Left lower leg: No edema.  Lymphadenopathy:     Cervical: No cervical adenopathy.     Right cervical: No superficial, deep or posterior cervical adenopathy.    Left cervical: No superficial, deep or posterior cervical adenopathy.     Upper Body:     Right upper body: No supraclavicular or axillary adenopathy.     Left upper body: No supraclavicular or axillary adenopathy.  Neurological:     General: No focal deficit present.     Mental Status: She is alert and oriented to person, place, and time.  Psychiatric:        Mood and Affect: Mood normal.        Behavior: Behavior normal.     LABS:      Latest Ref Rng & Units 09/30/2022    1:00 PM 06/22/2022   11:05 AM 05/18/2022    2:36 PM  CBC  WBC 4.0 - 10.5 K/uL 4.1  3.8  2.8   Hemoglobin 12.0 - 15.0 g/dL 9.5  56.2  13.0   Hematocrit 36.0 - 46.0 % 29.5  32.8  31.7   Platelets 150 - 400 K/uL 240  136  173       Latest Ref Rng & Units  09/30/2022    1:00 PM 06/22/2022   11:05 AM 05/18/2022    2:36 PM  CMP  Glucose 70 - 99 mg/dL 94  956  213   BUN 8 - 23 mg/dL 28  32  25   Creatinine 0.44 - 1.00 mg/dL 0.86  5.78  4.69   Sodium 135 - 145 mmol/L 134  137  136   Potassium 3.5 - 5.1 mmol/L 4.0  4.0  4.0   Chloride 98 - 111 mmol/L 102  106  104   CO2 22 - 32 mmol/L 25  21  25    Calcium 8.9 - 10.3 mg/dL 8.4  9.0  8.9   Total Protein 6.5 - 8.1 g/dL 6.5  6.4  6.3   Total Bilirubin 0.3 - 1.2 mg/dL 0.6  0.4  0.6   Alkaline Phos 38 - 126 U/L 129  104  95   AST 15 - 41 U/L 31  30  32   ALT 0 - 44 U/L 29  29  23       No results found for: "CEA1", "CEA" / No results found for: "CEA1", "CEA" No results found for: "PSA1" Lab Results  Component Value Date   GEX528 32 09/30/2022   No results found for: "CAN125"  No results found for: "TOTALPROTELP", "ALBUMINELP", "A1GS", "A2GS", "BETS", "BETA2SER", "GAMS", "MSPIKE", "SPEI" Lab Results  Component Value Date   TIBC 376 09/30/2022   TIBC 154 (L) 07/14/2021   FERRITIN 21 09/30/2022    FERRITIN 273 07/14/2021   IRONPCTSAT 22 09/30/2022   IRONPCTSAT 11 07/14/2021   No results found for: "LDH"   STUDIES:   NM PET Image Restag (PS) Skull Base To Thigh  Result Date: 11/02/2022 CLINICAL DATA:  Subsequent treatment strategy for pancreatic cancer. Status post chemotherapy and radiation. EXAM: NUCLEAR MEDICINE PET SKULL BASE TO THIGH TECHNIQUE: 6.7 mCi F-18 FDG was injected intravenously. Full-ring PET imaging was performed from the skull base to thigh after the radiotracer. CT data was obtained and used for attenuation correction and anatomic localization. Fasting blood glucose: 111 mg/dl COMPARISON:  MRI abdomen dated 10/18/2022. CT abdomen/pelvis dated 09/30/2022. FINDINGS: Mediastinal blood pool activity: SUV max 2.3 Liver activity: SUV max NA NECK: No hypermetabolic lymph nodes in the neck. Incidental CT findings: None. CHEST: No hypermetabolic mediastinal or hilar nodes. No suspicious pulmonary nodules on the CT scan. Right chest port terminates at the cavoatrial junction. Incidental CT findings: Mild coronary atherosclerosis of the LAD. ABDOMEN/PELVIS: Mild focal hypermetabolism in the pancreatic head, max SUV 4.8. Although the recent MR is certainly reassuring, this does raise concern for a non contour deforming lesion/recurrence. No abnormal hypermetabolism in the liver, spleen, or adrenal glands. No hypermetabolic abdominopelvic lymphadenopathy. Incidental CT findings: Bilateral renal sinus cysts. Atherosclerotic calcifications of the abdominal aorta and branch vessels. SKELETON: No focal hypermetabolic activity to suggest skeletal metastasis. Incidental CT findings: Degenerative changes of the visualized thoracolumbar spine. Lumbar spine fixation hardware. IMPRESSION: Mild focal hypermetabolism in the pancreatic head. While the recent MR is reassuring, this raises concern for a non contour deforming lesion/recurrence. Consider follow-up CT or MRI abdomen with/without contrast in 3  months. No evidence of metastatic disease. Electronically Signed   By: Charline Bills M.D.   On: 11/02/2022 09:38   MR 3D Recon At Scanner  Result Date: 10/28/2022 CLINICAL DATA:  Status post radiation therapy and chemotherapy for pancreatic cancer. Findings suspicious for pancreatic head mass on comparison CT. EXAM: MRI ABDOMEN WITHOUT AND WITH CONTRAST TECHNIQUE: Multiplanar multisequence  MR imaging of the abdomen was performed both before and after the administration of intravenous contrast. CONTRAST:  5mL GADAVIST GADOBUTROL 1 MMOL/ML IV SOLN COMPARISON:  CTs, most recent 09/30/2022. Most recent MRI 08/03/2021. FINDINGS: Lower chest: Mild cardiomegaly. Trace bilateral pleural fluid is likely physiologic. Hepatobiliary: Tiny hepatic cysts. No suspicious liver lesion. Cholecystectomy without biliary duct dilatation or choledocholithiasis. Pancreas: Dorsal duct entering the duodenum on 21/22 suggests pancreas divisum or variant. Again identified is pancreatic tail, body, and neck atrophy with maintenance side branch duct dilatation. The duct dilatation is felt to be mildly progressive compared to the MRI of 08/03/2021. For example, measures 11 mm in the neck on 18/4 versus 9 mm when remeasured in a similar fashion on that exam. 11 mm on coronal image 20/3 versus 9 mm when measured in a similar fashion on the prior. Again followed to the level of the pancreatic head, where an abrupt transition to decompressed pancreatic duct occurs. No convincing evidence of underlying obstructive mass. No peripancreatic edema. Spleen:  Normal in size, without focal abnormality. Adrenals/Urinary Tract: Normal adrenal glands. Bilateral renal sinus cysts. No hydronephrosis. Stomach/Bowel: The proximal stomach is underdistended. The gastric antrum is also underdistended but appears thick walled including on 38/17. Large colonic stool burden. Normal small bowel caliber. Vascular/Lymphatic: Aortic atherosclerosis. Patent portal  and splenic veins. No abdominal adenopathy. Other: No ascites. The peripancreatic pseudocyst adjacent the head has resolved since the prior MRI. No evidence of omental or peritoneal disease. Musculoskeletal: Lumbar spine fixation. IMPRESSION: 1. Upstream pancreatic duct dilatation and atrophy. The duct dilatation is felt to be mildly progressive including back to the comparison MRI of 08/03/2021. This again undergoes an abrupt transition in the region of the pancreatic head, without convincing evidence of underlying mass. 2. No evidence of metastatic disease 3. Gastric underdistention with possible antral wall thickening. Question gastritis, possibly radiation induced. 4.  Aortic Atherosclerosis (ICD10-I70.0). 5.  Possible constipation. Electronically Signed   By: Jeronimo Greaves M.D.   On: 10/28/2022 13:55   MR Abdomen W Wo Contrast  Result Date: 10/25/2022 CLINICAL DATA:  Status post radiation therapy and chemotherapy for pancreatic cancer. Findings suspicious for pancreatic head mass on comparison CT. EXAM: MRI ABDOMEN WITHOUT AND WITH CONTRAST TECHNIQUE: Multiplanar multisequence MR imaging of the abdomen was performed both before and after the administration of intravenous contrast. CONTRAST:  5mL GADAVIST GADOBUTROL 1 MMOL/ML IV SOLN COMPARISON:  CTs, most recent 09/30/2022. Most recent MRI 08/03/2021. FINDINGS: Lower chest: Mild cardiomegaly. Trace bilateral pleural fluid is likely physiologic. Hepatobiliary: Tiny hepatic cysts. No suspicious liver lesion. Cholecystectomy without biliary duct dilatation or choledocholithiasis. Pancreas: Dorsal duct entering the duodenum on 21/22 suggests pancreas divisum or variant. Again identified is pancreatic tail, body, and neck atrophy with maintenance side branch duct dilatation. The duct dilatation is felt to be mildly progressive compared to the MRI of 08/03/2021. For example, measures 11 mm in the neck on 18/4 versus 9 mm when remeasured in a similar fashion on  that exam. 11 mm on coronal image 20/3 versus 9 mm when measured in a similar fashion on the prior. Again followed to the level of the pancreatic head, where an abrupt transition to decompressed pancreatic duct occurs. No convincing evidence of underlying obstructive mass. No peripancreatic edema. Spleen:  Normal in size, without focal abnormality. Adrenals/Urinary Tract: Normal adrenal glands. Bilateral renal sinus cysts. No hydronephrosis. Stomach/Bowel: The proximal stomach is underdistended. The gastric antrum is also underdistended but appears thick walled including on 38/17. Large colonic stool  burden. Normal small bowel caliber. Vascular/Lymphatic: Aortic atherosclerosis. Patent portal and splenic veins. No abdominal adenopathy. Other: No ascites. The peripancreatic pseudocyst adjacent the head has resolved since the prior MRI. No evidence of omental or peritoneal disease. Musculoskeletal: Lumbar spine fixation. IMPRESSION: 1. Upstream pancreatic duct dilatation and atrophy. The duct dilatation is felt to be mildly progressive including back to the comparison MRI of 08/03/2021. This again undergoes an abrupt transition in the region of the pancreatic head, without convincing evidence of underlying mass. 2. No evidence of metastatic disease 3. Gastric underdistention with possible antral wall thickening. Question gastritis, possibly radiation induced. 4.  Aortic Atherosclerosis (ICD10-I70.0). 5.  Possible constipation. Electronically Signed   By: Jeronimo Greaves M.D.   On: 10/25/2022 13:59

## 2022-11-02 NOTE — Patient Instructions (Signed)
[facility]  Discharge Instructions  You were seen and examined today by [name].  [name] discussed your most recent lab work and scan which revealed that everything looks good and stable.  Follow-up as scheduled in 3 months.    Thank you for choosing [facility] to provide your oncology and hematology care.   To afford each patient quality time with our provider, please arrive at least 15 minutes before your scheduled appointment time. You may need to reschedule your appointment if you arrive late (10 or more minutes). Arriving late affects you and other patients whose appointments are after yours.  Also, if you miss three or more appointments without notifying the office, you may be dismissed from the clinic at the provider's discretion.    Again, thank you for choosing [facility].  Our hope is that these requests will decrease the amount of time that you wait before being seen by our physicians.   If you have a lab appointment with the Cancer Center - please note that after April 8th, all labs will be drawn in the cancer center.  You do not have to check in or register with the main entrance as you have in the past but will complete your check-in at the cancer center.            _____________________________________________________________  Should you have questions after your visit to [facility], please contact our office at [phone] and follow the prompts.  Our office hours are 8:00 a.m. to 4:30 p.m. Monday - Thursday and 8:00 a.m. to 2:30 p.m. Friday.  Please note that voicemails left after 4:00 p.m. may not be returned until the following business day.  We are closed weekends and all major holidays.  You do have access to a nurse 24-7, just call the main number to the clinic [phone] and do not press any options, hold on the line and a nurse will answer the phone.    For  prescription refill requests, have your pharmacy contact our office and allow 72 hours.    Masks are no longer required in the cancer centers. If you would like for your care team to wear a mask while they are taking care of you, please let them know. You may have one support person who is at least 79 years old accompany you for your appointments.

## 2022-11-03 ENCOUNTER — Other Ambulatory Visit: Payer: Self-pay

## 2022-11-11 ENCOUNTER — Other Ambulatory Visit: Payer: Self-pay | Admitting: Gastroenterology

## 2022-12-08 ENCOUNTER — Other Ambulatory Visit: Payer: Medicare Other

## 2022-12-08 ENCOUNTER — Ambulatory Visit: Payer: Medicare Other

## 2022-12-10 ENCOUNTER — Other Ambulatory Visit: Payer: Self-pay | Admitting: Oncology

## 2022-12-10 ENCOUNTER — Inpatient Hospital Stay: Payer: Medicare Other

## 2022-12-10 ENCOUNTER — Inpatient Hospital Stay: Payer: Medicare Other | Attending: Hematology

## 2022-12-10 DIAGNOSIS — D509 Iron deficiency anemia, unspecified: Secondary | ICD-10-CM | POA: Diagnosis present

## 2022-12-10 DIAGNOSIS — C250 Malignant neoplasm of head of pancreas: Secondary | ICD-10-CM | POA: Insufficient documentation

## 2022-12-10 LAB — COMPREHENSIVE METABOLIC PANEL
ALT: 22 U/L (ref 0–44)
AST: 25 U/L (ref 15–41)
Albumin: 3.1 g/dL — ABNORMAL LOW (ref 3.5–5.0)
Alkaline Phosphatase: 95 U/L (ref 38–126)
Anion gap: 7 (ref 5–15)
BUN: 34 mg/dL — ABNORMAL HIGH (ref 8–23)
CO2: 23 mmol/L (ref 22–32)
Calcium: 8.5 mg/dL — ABNORMAL LOW (ref 8.9–10.3)
Chloride: 108 mmol/L (ref 98–111)
Creatinine, Ser: 0.77 mg/dL (ref 0.44–1.00)
GFR, Estimated: >60 mL/min (ref >60)
Glucose, Bld: 123 mg/dL — ABNORMAL HIGH (ref 70–99)
Potassium: 4.1 mmol/L (ref 3.5–5.1)
Sodium: 138 mmol/L (ref 135–145)
Total Bilirubin: 0.3 mg/dL (ref <1.2)
Total Protein: 5.6 g/dL — ABNORMAL LOW (ref 6.5–8.1)

## 2022-12-10 LAB — CBC WITH DIFFERENTIAL/PLATELET
Abs Immature Granulocytes: 0.01 10*3/uL (ref 0.00–0.07)
Basophils Absolute: 0 10*3/uL (ref 0.0–0.1)
Basophils Relative: 0 %
Eosinophils Absolute: 0 10*3/uL (ref 0.0–0.5)
Eosinophils Relative: 1 %
HCT: 22.6 % — ABNORMAL LOW (ref 36.0–46.0)
Hemoglobin: 7 g/dL — ABNORMAL LOW (ref 12.0–15.0)
Immature Granulocytes: 0 %
Lymphocytes Relative: 22 %
Lymphs Abs: 0.6 10*3/uL — ABNORMAL LOW (ref 0.7–4.0)
MCH: 34.1 pg — ABNORMAL HIGH (ref 26.0–34.0)
MCHC: 31 g/dL (ref 30.0–36.0)
MCV: 110.2 fL — ABNORMAL HIGH (ref 80.0–100.0)
Monocytes Absolute: 0.4 10*3/uL (ref 0.1–1.0)
Monocytes Relative: 16 %
Neutro Abs: 1.7 10*3/uL (ref 1.7–7.7)
Neutrophils Relative %: 61 %
Platelets: 190 10*3/uL (ref 150–400)
RBC: 2.05 MIL/uL — ABNORMAL LOW (ref 3.87–5.11)
RDW: 14.3 % (ref 11.5–15.5)
WBC: 2.7 10*3/uL — ABNORMAL LOW (ref 4.0–10.5)
nRBC: 0 % (ref 0.0–0.2)

## 2022-12-10 LAB — FERRITIN: Ferritin: 27 ng/mL (ref 11–307)

## 2022-12-10 LAB — IRON AND TIBC
Iron: 39 ug/dL (ref 28–170)
Saturation Ratios: 13 % (ref 10.4–31.8)
TIBC: 310 ug/dL (ref 250–450)
UIBC: 271 ug/dL

## 2022-12-10 LAB — MAGNESIUM: Magnesium: 2 mg/dL (ref 1.7–2.4)

## 2022-12-10 MED ORDER — HEPARIN SOD (PORK) LOCK FLUSH 100 UNIT/ML IV SOLN
500.0000 [IU] | Freq: Once | INTRAVENOUS | Status: AC
  Administered 2022-12-10: 500 [IU] via INTRAVENOUS

## 2022-12-10 MED ORDER — CYANOCOBALAMIN 1000 MCG/ML IJ SOLN
1000.0000 ug | Freq: Once | INTRAMUSCULAR | Status: AC
  Administered 2022-12-10: 1000 ug via INTRAMUSCULAR
  Filled 2022-12-10: qty 1

## 2022-12-10 MED ORDER — SODIUM CHLORIDE 0.9% FLUSH
10.0000 mL | Freq: Once | INTRAVENOUS | Status: AC
  Administered 2022-12-10: 10 mL via INTRAVENOUS

## 2022-12-10 NOTE — Progress Notes (Signed)
Patient tolerated B12 injection with no complaints voiced.  Site clean and dry with no bruising or swelling noted at site.  See MAR for details.  Band aid applied.  Patient stable during and after injection.  Vss with discharge and left in satisfactory condition with no s/s of distress noted.   Patients port flushed without difficulty.  Good blood return noted with no bruising or swelling noted at site.  Band aid applied.  VSS with discharge and left in satisfactory condition with no s/s of distress noted. All follow ups as scheduled.       Aniello Christopoulos [employer]

## 2022-12-10 NOTE — Patient Instructions (Signed)
Vitamin B12 Capsules or Tablets What is this medication? VITAMIN B12 (VAHY tuh min B12) prevents and treats low vitamin B12 levels in your body. It is used in people who do not get enough vitamin B12 from their diet or when their digestive tract does not absorb enough. Vitamin B12 plays an important role in maintaining the health of your nervous system and red blood cells. This medicine may be used for other purposes; ask your health care provider or pharmacist if you have questions. What should I tell my care team before I take this medication? They need to know if you have any of these conditions: Anemia Kidney disease Leber's disease Malabsorption disorder An unusual or allergic reaction to cyanocobalamin, cobalt, other medications, foods, dyes, or preservatives Pregnant or trying to get pregnant Breast-feeding How should I use this medication? Take this medication by mouth with a glass of water. Follow the directions on the package or prescription label. If you are taking the tablets, do not chew, cut, or crush this medication. If using a vitamin solution, use a specially marked spoon or dropper to measure each dose. Ask your pharmacist if you do not have one. Household spoons are not accurate. For best results take this vitamin with food. Take your medication at regular intervals. Do not take your medication more often than directed. Talk to your care team about the use of this medication in children. While this medication may be prescribed for selected conditions, precautions do apply. Overdosage: If you think you have taken too much of this medicine contact a poison control center or emergency room at once. NOTE: This medicine is only for you. Do not share this medicine with others. What if I miss a dose? If you miss a dose, take it as soon as you can. If it is almost time for your next dose, take only that dose. Do not take double or extra doses. What may interact with this  medication? Alcohol Aminosalicylic acid Colchicine Medications that suppress your bone marrow, such as chemotherapy, chloramphenicol This list may not describe all possible interactions. Give your health care provider a list of all the medicines, herbs, non-prescription drugs, or dietary supplements you use. Also tell them if you smoke, drink alcohol, or use illegal drugs. Some items may interact with your medicine. What should I watch for while using this medication? Follow a healthy diet. Taking a vitamin supplement does not replace the need for a balanced diet. Some foods that have vitamin B12 naturally are fish, seafood, egg yolk, milk, and fermented cheese. Too much of this vitamin can be unsafe. Talk to your care team about how much is right for you. What side effects may I notice from receiving this medication? Side effects that you should report to your care team as soon as possible: Allergic reactions--skin rash, itching, hives, swelling of the face, lips, tongue, or throat Side effects that usually do not require medical attention (report to your care team if they continue or are bothersome): Diarrhea Fatigue Headache Nausea This list may not describe all possible side effects. Call your doctor for medical advice about side effects. You may report side effects to FDA at 1-800-FDA-1088. Where should I keep my medication? Keep out of the reach of children and pets. Store at room temperature between 15 and 30 degrees C (59 and 85 degrees F). Protect from heat and light. Throw away any unused medication after the expiration date. NOTE: This sheet is a summary. It may not cover all  possible information. If you have questions about this medicine, talk to your doctor, pharmacist, or health care provider.  2024 Elsevier/Gold Standard (2020-09-23 00:00:00)

## 2022-12-13 ENCOUNTER — Other Ambulatory Visit: Payer: Self-pay | Admitting: Gastroenterology

## 2022-12-13 LAB — CANCER ANTIGEN 19-9: CA 19-9: 86 U/mL — ABNORMAL HIGH (ref 0–35)

## 2022-12-15 ENCOUNTER — Inpatient Hospital Stay (HOSPITAL_BASED_OUTPATIENT_CLINIC_OR_DEPARTMENT_OTHER): Payer: Medicare Other | Admitting: Hematology

## 2022-12-15 ENCOUNTER — Other Ambulatory Visit: Payer: Self-pay | Admitting: *Deleted

## 2022-12-15 ENCOUNTER — Telehealth: Payer: Self-pay | Admitting: *Deleted

## 2022-12-15 DIAGNOSIS — D509 Iron deficiency anemia, unspecified: Secondary | ICD-10-CM | POA: Diagnosis not present

## 2022-12-15 LAB — CBC WITH DIFFERENTIAL/PLATELET
Abs Immature Granulocytes: 0.02 10*3/uL (ref 0.00–0.07)
Basophils Absolute: 0 10*3/uL (ref 0.0–0.1)
Basophils Relative: 0 %
Eosinophils Absolute: 0 10*3/uL (ref 0.0–0.5)
Eosinophils Relative: 1 %
HCT: 20.2 % — ABNORMAL LOW (ref 36.0–46.0)
Hemoglobin: 6.2 g/dL — CL (ref 12.0–15.0)
Immature Granulocytes: 0 %
Lymphocytes Relative: 23 %
Lymphs Abs: 1.2 10*3/uL (ref 0.7–4.0)
MCH: 33.2 pg (ref 26.0–34.0)
MCHC: 30.7 g/dL (ref 30.0–36.0)
MCV: 108 fL — ABNORMAL HIGH (ref 80.0–100.0)
Monocytes Absolute: 0.7 10*3/uL (ref 0.1–1.0)
Monocytes Relative: 13 %
Neutro Abs: 3.3 10*3/uL (ref 1.7–7.7)
Neutrophils Relative %: 63 %
Platelets: 229 10*3/uL (ref 150–400)
RBC: 1.87 MIL/uL — ABNORMAL LOW (ref 3.87–5.11)
RDW: 14.5 % (ref 11.5–15.5)
WBC: 5.2 10*3/uL (ref 4.0–10.5)
nRBC: 0 % (ref 0.0–0.2)

## 2022-12-15 LAB — PREPARE RBC (CROSSMATCH)

## 2022-12-15 NOTE — Telephone Encounter (Signed)
Patient called and advised that she is having profound fatigue with shortness of breath with little exertion.  Hgb was 7.0 on 11/15.  Consulted with Dr. Anders Simmonds and recommended repeat CBC & Type and Screen.  Patient notified to come in at 1245 for labs and that she would be placed on the schedule for blood transfusion tomorrow.  Verbalized understanding.

## 2022-12-15 NOTE — Progress Notes (Signed)
 CRITICAL VALUE ALERT Critical value received:  HGB 6.2 Date of notification:  12-15-2022 Time of notification: 13:50 pm.  Critical value read back:  Yes.   Nurse who received alert:  B.Maron Stanzione RN.  MD notified time and response:  Dr. Anders Simmonds @ 13:54 pm. Patient will receive 2 units of blood on 12/16/2022 @ 09:30 am. Blood bank and patient aware.

## 2022-12-16 ENCOUNTER — Inpatient Hospital Stay: Payer: Medicare Other

## 2022-12-16 DIAGNOSIS — D509 Iron deficiency anemia, unspecified: Secondary | ICD-10-CM

## 2022-12-16 MED ORDER — HEPARIN SOD (PORK) LOCK FLUSH 100 UNIT/ML IV SOLN
500.0000 [IU] | Freq: Once | INTRAVENOUS | Status: AC
  Administered 2022-12-16: 500 [IU] via INTRAVENOUS

## 2022-12-16 MED ORDER — DIPHENHYDRAMINE HCL 25 MG PO CAPS
25.0000 mg | ORAL_CAPSULE | Freq: Once | ORAL | Status: AC
  Administered 2022-12-16: 25 mg via ORAL
  Filled 2022-12-16: qty 1

## 2022-12-16 MED ORDER — SODIUM CHLORIDE 0.9% IV SOLUTION
250.0000 mL | INTRAVENOUS | Status: DC
Start: 2022-12-16 — End: 2022-12-16
  Administered 2022-12-16: 250 mL via INTRAVENOUS

## 2022-12-16 MED ORDER — ACETAMINOPHEN 325 MG PO TABS
650.0000 mg | ORAL_TABLET | Freq: Once | ORAL | Status: AC
Start: 2022-12-16 — End: 2022-12-16
  Administered 2022-12-16: 650 mg via ORAL
  Filled 2022-12-16: qty 2

## 2022-12-16 MED ORDER — SODIUM CHLORIDE 0.9% FLUSH
10.0000 mL | INTRAVENOUS | Status: DC | PRN
  Administered 2022-12-16: 10 mL via INTRAVENOUS

## 2022-12-16 NOTE — Progress Notes (Signed)
Patient presents today for 2 units of blood per provider's order. Vital signs stable and pt voiced no new complaints at this time.  2 units of blood given today per MD orders. Tolerated infusion without adverse affects. Vital signs stable. No complaints at this time. Discharged from clinic via wheelchair in stable condition. Alert and oriented x 3. F/U with [facility] as scheduled.

## 2022-12-16 NOTE — Patient Instructions (Signed)
[facility] - A DEPT OF [facility]  Discharge Instructions: Thank you for choosing [facility] to provide your oncology and hematology care.  If you have a lab appointment with the Cancer Center - please note that after April 8th, 2024, all labs will be drawn in the cancer center.  You do not have to check in or register with the main entrance as you have in the past but will complete your check-in in the cancer center.  Wear comfortable clothing and clothing appropriate for easy access to any Portacath or PICC line.   We strive to give you quality time with your provider. You may need to reschedule your appointment if you arrive late (15 or more minutes).  Arriving late affects you and other patients whose appointments are after yours.  Also, if you miss three or more appointments without notifying the office, you may be dismissed from the clinic at the provider's discretion.      For prescription refill requests, have your pharmacy contact our office and allow 72 hours for refills to be completed.    Today you received 2 units of blood     BELOW ARE SYMPTOMS THAT SHOULD BE REPORTED IMMEDIATELY: *FEVER GREATER THAN 100.4 F (38 C) OR HIGHER *CHILLS OR SWEATING *NAUSEA AND VOMITING THAT IS NOT CONTROLLED WITH YOUR NAUSEA MEDICATION *UNUSUAL SHORTNESS OF BREATH *UNUSUAL BRUISING OR BLEEDING *URINARY PROBLEMS (pain or burning when urinating, or frequent urination) *BOWEL PROBLEMS (unusual diarrhea, constipation, pain near the anus) TENDERNESS IN MOUTH AND THROAT WITH OR WITHOUT PRESENCE OF ULCERS (sore throat, sores in mouth, or a toothache) UNUSUAL RASH, SWELLING OR PAIN  UNUSUAL VAGINAL DISCHARGE OR ITCHING   Items with * indicate a potential emergency and should be followed up as soon as possible or go to the Emergency Department if any problems should occur.  Please show the CHEMOTHERAPY ALERT CARD or IMMUNOTHERAPY ALERT CARD at check-in  to the Emergency Department and triage nurse.  Should you have questions after your visit or need to cancel or reschedule your appointment, please contact [facility] - A DEPT OF [facility] [phone]  and follow the prompts.  Office hours are 8:00 a.m. to 4:30 p.m. Monday - Friday. Please note that voicemails left after 4:00 p.m. may not be returned until the following business day.  We are closed weekends and major holidays. You have access to a nurse at all times for urgent questions. Please call the main number to the clinic [phone] and follow the prompts.  For any non-urgent questions, you may also contact your provider using MyChart. We now offer e-Visits for anyone 67 and older to request care online for non-urgent symptoms. For details visit [email or web address].   Also download the MyChart app! Go to the app store, search "MyChart", open the app, select [facility], and log in with your MyChart username and password.

## 2022-12-17 LAB — BPAM RBC
Blood Product Expiration Date: 202412152359
Blood Product Expiration Date: 202412152359
ISSUE DATE / TIME: 202411211017
ISSUE DATE / TIME: 202411211206
Unit Type and Rh: 6200
Unit Type and Rh: 6200

## 2022-12-17 LAB — TYPE AND SCREEN
ABO/RH(D): A POS
Antibody Screen: NEGATIVE
Unit division: 0
Unit division: 0

## 2022-12-22 ENCOUNTER — Other Ambulatory Visit: Payer: Self-pay

## 2022-12-27 ENCOUNTER — Inpatient Hospital Stay (HOSPITAL_COMMUNITY)
Admission: EM | Admit: 2022-12-27 | Discharge: 2022-12-31 | DRG: 378 | Disposition: A | Discharge status: Home / Self Care | Payer: Medicare Other | Attending: Internal Medicine | Admitting: Internal Medicine

## 2022-12-27 ENCOUNTER — Other Ambulatory Visit: Payer: Self-pay

## 2022-12-27 ENCOUNTER — Telehealth: Payer: Self-pay | Admitting: *Deleted

## 2022-12-27 ENCOUNTER — Encounter (HOSPITAL_COMMUNITY): Payer: Self-pay | Admitting: *Deleted

## 2022-12-27 DIAGNOSIS — K449 Diaphragmatic hernia without obstruction or gangrene: Secondary | ICD-10-CM | POA: Diagnosis present

## 2022-12-27 DIAGNOSIS — K921 Melena: Secondary | ICD-10-CM | POA: Diagnosis present

## 2022-12-27 DIAGNOSIS — D62 Acute posthemorrhagic anemia: Secondary | ICD-10-CM | POA: Diagnosis present

## 2022-12-27 DIAGNOSIS — K5900 Constipation, unspecified: Secondary | ICD-10-CM | POA: Diagnosis present

## 2022-12-27 DIAGNOSIS — Z981 Arthrodesis status: Secondary | ICD-10-CM

## 2022-12-27 DIAGNOSIS — C259 Malignant neoplasm of pancreas, unspecified: Secondary | ICD-10-CM | POA: Diagnosis present

## 2022-12-27 DIAGNOSIS — F32A Depression, unspecified: Secondary | ICD-10-CM | POA: Diagnosis present

## 2022-12-27 DIAGNOSIS — Z91048 Other nonmedicinal substance allergy status: Secondary | ICD-10-CM

## 2022-12-27 DIAGNOSIS — K922 Gastrointestinal hemorrhage, unspecified: Principal | ICD-10-CM

## 2022-12-27 DIAGNOSIS — K2100 Gastro-esophageal reflux disease with esophagitis, without bleeding: Secondary | ICD-10-CM | POA: Diagnosis not present

## 2022-12-27 DIAGNOSIS — T45515A Adverse effect of anticoagulants, initial encounter: Secondary | ICD-10-CM | POA: Diagnosis present

## 2022-12-27 DIAGNOSIS — K219 Gastro-esophageal reflux disease without esophagitis: Secondary | ICD-10-CM | POA: Diagnosis present

## 2022-12-27 DIAGNOSIS — Z79899 Other long term (current) drug therapy: Secondary | ICD-10-CM | POA: Diagnosis not present

## 2022-12-27 DIAGNOSIS — D61818 Other pancytopenia: Secondary | ICD-10-CM | POA: Diagnosis present

## 2022-12-27 DIAGNOSIS — D509 Iron deficiency anemia, unspecified: Secondary | ICD-10-CM | POA: Diagnosis present

## 2022-12-27 DIAGNOSIS — Z888 Allergy status to other drugs, medicaments and biological substances status: Secondary | ICD-10-CM | POA: Diagnosis not present

## 2022-12-27 DIAGNOSIS — Z7901 Long term (current) use of anticoagulants: Secondary | ICD-10-CM | POA: Diagnosis not present

## 2022-12-27 DIAGNOSIS — Z923 Personal history of irradiation: Secondary | ICD-10-CM

## 2022-12-27 DIAGNOSIS — Z9221 Personal history of antineoplastic chemotherapy: Secondary | ICD-10-CM | POA: Diagnosis not present

## 2022-12-27 DIAGNOSIS — K3182 Dieulafoy lesion (hemorrhagic) of stomach and duodenum: Secondary | ICD-10-CM | POA: Diagnosis present

## 2022-12-27 DIAGNOSIS — Z86711 Personal history of pulmonary embolism: Secondary | ICD-10-CM | POA: Diagnosis not present

## 2022-12-27 DIAGNOSIS — D649 Anemia, unspecified: Secondary | ICD-10-CM | POA: Diagnosis present

## 2022-12-27 DIAGNOSIS — Z91018 Allergy to other foods: Secondary | ICD-10-CM

## 2022-12-27 DIAGNOSIS — D125 Benign neoplasm of sigmoid colon: Secondary | ICD-10-CM | POA: Diagnosis present

## 2022-12-27 DIAGNOSIS — C250 Malignant neoplasm of head of pancreas: Secondary | ICD-10-CM | POA: Diagnosis not present

## 2022-12-27 DIAGNOSIS — F419 Anxiety disorder, unspecified: Secondary | ICD-10-CM | POA: Diagnosis present

## 2022-12-27 DIAGNOSIS — I10 Essential (primary) hypertension: Secondary | ICD-10-CM | POA: Diagnosis present

## 2022-12-27 DIAGNOSIS — Z8719 Personal history of other diseases of the digestive system: Secondary | ICD-10-CM | POA: Diagnosis not present

## 2022-12-27 DIAGNOSIS — D6832 Hemorrhagic disorder due to extrinsic circulating anticoagulants: Secondary | ICD-10-CM | POA: Diagnosis present

## 2022-12-27 LAB — CBC WITH DIFFERENTIAL/PLATELET
Abs Immature Granulocytes: 0.01 10*3/uL (ref 0.00–0.07)
Basophils Absolute: 0 10*3/uL (ref 0.0–0.1)
Basophils Relative: 0 %
Eosinophils Absolute: 0 10*3/uL (ref 0.0–0.5)
Eosinophils Relative: 1 %
HCT: 23.8 % — ABNORMAL LOW (ref 36.0–46.0)
Hemoglobin: 7.1 g/dL — ABNORMAL LOW (ref 12.0–15.0)
Immature Granulocytes: 0 %
Lymphocytes Relative: 17 %
Lymphs Abs: 0.6 10*3/uL — ABNORMAL LOW (ref 0.7–4.0)
MCH: 30.2 pg (ref 26.0–34.0)
MCHC: 29.8 g/dL — ABNORMAL LOW (ref 30.0–36.0)
MCV: 101.3 fL — ABNORMAL HIGH (ref 80.0–100.0)
Monocytes Absolute: 0.5 10*3/uL (ref 0.1–1.0)
Monocytes Relative: 13 %
Neutro Abs: 2.7 10*3/uL (ref 1.7–7.7)
Neutrophils Relative %: 69 %
Platelets: 217 10*3/uL (ref 150–400)
RBC: 2.35 MIL/uL — ABNORMAL LOW (ref 3.87–5.11)
RDW: 16.9 % — ABNORMAL HIGH (ref 11.5–15.5)
WBC: 3.8 10*3/uL — ABNORMAL LOW (ref 4.0–10.5)
nRBC: 0 % (ref 0.0–0.2)

## 2022-12-27 LAB — I-STAT CHEM 8, ED
BUN: 28 mg/dL — ABNORMAL HIGH (ref 8–23)
Calcium, Ion: 1.08 mmol/L — ABNORMAL LOW (ref 1.15–1.40)
Chloride: 106 mmol/L (ref 98–111)
Creatinine, Ser: 0.7 mg/dL (ref 0.44–1.00)
Glucose, Bld: 132 mg/dL — ABNORMAL HIGH (ref 70–99)
HCT: 21 % — ABNORMAL LOW (ref 36.0–46.0)
Hemoglobin: 7.1 g/dL — ABNORMAL LOW (ref 12.0–15.0)
Potassium: 3.9 mmol/L (ref 3.5–5.1)
Sodium: 139 mmol/L (ref 135–145)
TCO2: 21 mmol/L — ABNORMAL LOW (ref 22–32)

## 2022-12-27 LAB — COMPREHENSIVE METABOLIC PANEL
ALT: 23 U/L (ref 0–44)
AST: 24 U/L (ref 15–41)
Albumin: 3.1 g/dL — ABNORMAL LOW (ref 3.5–5.0)
Alkaline Phosphatase: 90 U/L (ref 38–126)
Anion gap: 6 (ref 5–15)
BUN: 33 mg/dL — ABNORMAL HIGH (ref 8–23)
CO2: 24 mmol/L (ref 22–32)
Calcium: 8.8 mg/dL — ABNORMAL LOW (ref 8.9–10.3)
Chloride: 109 mmol/L (ref 98–111)
Creatinine, Ser: 0.7 mg/dL (ref 0.44–1.00)
GFR, Estimated: >60 mL/min (ref >60)
Glucose, Bld: 97 mg/dL (ref 70–99)
Potassium: 4.2 mmol/L (ref 3.5–5.1)
Sodium: 139 mmol/L (ref 135–145)
Total Bilirubin: 0.3 mg/dL (ref <1.2)
Total Protein: 5.6 g/dL — ABNORMAL LOW (ref 6.5–8.1)

## 2022-12-27 LAB — PROTIME-INR
INR: 1.1 (ref 0.8–1.2)
Prothrombin Time: 14.8 s (ref 11.4–15.2)

## 2022-12-27 LAB — APTT: aPTT: 58 s — ABNORMAL HIGH (ref 24–36)

## 2022-12-27 LAB — PREPARE RBC (CROSSMATCH)

## 2022-12-27 MED ORDER — LINACLOTIDE 145 MCG PO CAPS
145.0000 ug | ORAL_CAPSULE | Freq: Every day | ORAL | Status: DC
  Administered 2022-12-28 – 2022-12-31 (×3): 145 ug via ORAL
  Filled 2022-12-27 (×3): qty 1

## 2022-12-27 MED ORDER — MIRTAZAPINE 30 MG PO TABS
30.0000 mg | ORAL_TABLET | Freq: Every day | ORAL | Status: DC
  Administered 2022-12-27 – 2022-12-30 (×4): 30 mg via ORAL
  Filled 2022-12-27 (×4): qty 1

## 2022-12-27 MED ORDER — ESCITALOPRAM OXALATE 10 MG PO TABS
20.0000 mg | ORAL_TABLET | Freq: Every day | ORAL | Status: DC
  Administered 2022-12-27 – 2022-12-30 (×4): 20 mg via ORAL
  Filled 2022-12-27 (×4): qty 2

## 2022-12-27 MED ORDER — ACETAMINOPHEN 650 MG RE SUPP: 650.0000 mg | Freq: Four times a day (QID) | RECTAL | Status: DC | PRN

## 2022-12-27 MED ORDER — ONDANSETRON HCL 4 MG/2ML IJ SOLN: 4.0000 mg | Freq: Four times a day (QID) | INTRAMUSCULAR | Status: DC | PRN

## 2022-12-27 MED ORDER — ONDANSETRON HCL 4 MG PO TABS: 4.0000 mg | ORAL_TABLET | Freq: Four times a day (QID) | ORAL | Status: DC | PRN

## 2022-12-27 MED ORDER — PANTOPRAZOLE SODIUM 40 MG IV SOLR
40.0000 mg | Freq: Once | INTRAVENOUS | Status: AC
Start: 2022-12-27 — End: 2022-12-27
  Administered 2022-12-27: 40 mg via INTRAVENOUS
  Filled 2022-12-27: qty 10

## 2022-12-27 MED ORDER — METHOCARBAMOL 500 MG PO TABS: 500.0000 mg | ORAL_TABLET | Freq: Three times a day (TID) | ORAL | Status: DC | PRN

## 2022-12-27 MED ORDER — METOPROLOL SUCCINATE ER 25 MG PO TB24
12.5000 mg | ORAL_TABLET | Freq: Every day | ORAL | Status: DC
  Administered 2022-12-28 – 2022-12-31 (×3): 12.5 mg via ORAL
  Filled 2022-12-27 (×3): qty 1

## 2022-12-27 MED ORDER — VITAMIN B-12 100 MCG PO TABS
500.0000 ug | ORAL_TABLET | Freq: Every day | ORAL | Status: DC
  Administered 2022-12-30 – 2022-12-31 (×2): 500 ug via ORAL
  Filled 2022-12-27 (×2): qty 5

## 2022-12-27 MED ORDER — BUSPIRONE HCL 5 MG PO TABS
20.0000 mg | ORAL_TABLET | Freq: Two times a day (BID) | ORAL | Status: DC
  Administered 2022-12-27 – 2022-12-31 (×7): 20 mg via ORAL
  Filled 2022-12-27 (×2): qty 1
  Filled 2022-12-27 (×2): qty 4
  Filled 2022-12-27 (×2): qty 1
  Filled 2022-12-27: qty 4

## 2022-12-27 MED ORDER — POLYETHYL GLYCOL-PROPYL GLYCOL 0.4-0.3 % OP GEL: Freq: Every day | OPHTHALMIC | Status: DC

## 2022-12-27 MED ORDER — ACETAMINOPHEN 325 MG PO TABS
650.0000 mg | ORAL_TABLET | Freq: Four times a day (QID) | ORAL | Status: DC | PRN
  Administered 2022-12-28 – 2022-12-29 (×2): 650 mg via ORAL
  Filled 2022-12-27 (×2): qty 2

## 2022-12-27 MED ORDER — SODIUM CHLORIDE 0.9% IV SOLUTION: Freq: Once | INTRAVENOUS | Status: DC

## 2022-12-27 MED ORDER — BUPROPION HCL ER (XL) 300 MG PO TB24
300.0000 mg | ORAL_TABLET | Freq: Every day | ORAL | Status: DC
  Administered 2022-12-28 – 2022-12-31 (×3): 300 mg via ORAL
  Filled 2022-12-27 (×2): qty 1
  Filled 2022-12-27: qty 2

## 2022-12-27 MED ORDER — SODIUM CHLORIDE 0.9 % IV BOLUS
1000.0000 mL | Freq: Once | INTRAVENOUS | Status: AC
  Administered 2022-12-27: 1000 mL via INTRAVENOUS

## 2022-12-27 MED ORDER — PANTOPRAZOLE SODIUM 40 MG IV SOLR
40.0000 mg | Freq: Two times a day (BID) | INTRAVENOUS | Status: DC
  Administered 2022-12-28 – 2022-12-31 (×7): 40 mg via INTRAVENOUS
  Filled 2022-12-27 (×7): qty 10

## 2022-12-27 MED ORDER — EMPTY CONTAINERS FLEXIBLE MISC
900.0000 mg | Freq: Once | Status: AC
  Administered 2022-12-27: 900 mg via INTRAVENOUS
  Filled 2022-12-27: qty 90

## 2022-12-27 MED ORDER — POLYVINYL ALCOHOL 1.4 % OP SOLN
1.0000 [drp] | Freq: Every day | OPHTHALMIC | Status: DC
  Administered 2022-12-28: 1 [drp] via OPHTHALMIC
  Filled 2022-12-27: qty 15

## 2022-12-27 NOTE — ED Notes (Signed)
Pt NS stopped in port and restarted in right wrist IV. Blood going in port.

## 2022-12-27 NOTE — ED Provider Notes (Signed)
[place] EMERGENCY DEPARTMENT AT [facility] Provider Note   CSN: [id number] Arrival date & time: 12/27/22  1051     History {Add pertinent medical, surgical, social history, OB history to HPI:1} Chief Complaint  Patient presents with   GI Bleeding    Sheryl Bender is a 77 y.o. female.  Patient has a history of pancreatic cancer and anemia.  Patient is on Eliquis and has been having some maroon stools for last few days.   Weakness      Home Medications Prior to Admission medications   Medication Sig Start Date End Date Taking? Authorizing Provider  apixaban (ELIQUIS) 5 MG TABS tablet Take 5 mg by mouth 2 (two) times daily. 12/06/22 12/01/23 Yes [provider]  acetaminophen (TYLENOL) 500 MG tablet Take 2 tablets (1,000 mg total) by mouth every 8 (eight) hours as needed for moderate pain, headache or fever. Patient taking differently: Take 750-1,000 mg by mouth every 8 (eight) hours as needed for moderate pain, headache or fever. 07/21/21   Vassie Loll, MD  Aromatic Inhalants (VICKS VAPOINHALER) INHA Inhale 1 puff into the lungs daily as needed (congestion).    [provider]  betamethasone dipropionate 0.05 % cream Apply topically 2 (two) times daily. 04/20/22   Doreatha Massed, MD  buPROPion (WELLBUTRIN XL) 300 MG 24 hr tablet Take 300 mg by mouth daily after breakfast. Takes differently from prescribed: 20mg  BID 06/15/21   [provider]  busPIRone (BUSPAR) 10 MG tablet Take 20 mg by mouth 3 (three) times daily. 01/27/21   [provider]  calcium carbonate (TUMS - DOSED IN MG ELEMENTAL CALCIUM) 500 MG chewable tablet Chew 1 tablet (200 mg of elemental calcium total) by mouth 2 (two) times daily as needed for indigestion or heartburn. Patient taking differently: Chew 1-2 tablets by mouth 2 (two) times daily as needed for indigestion or heartburn. 08/31/21   Lewie Chamber, MD  Cholecalciferol (VITAMIN D3) 125 MCG (5000 UT) CAPS  Take 5,000 Units by mouth every other day.    [provider]  escitalopram (LEXAPRO) 20 MG tablet Take 20 mg by mouth at bedtime.    [provider]  fluorouracil CALGB 78938 1,920 mg/m2 in sodium chloride 0.9 % 150 mL Inject 1,920 mg/m2 into the vein over 48 hr.    [provider]  lactose free nutrition (BOOST) LIQD Take 237 mLs by mouth 2 (two) times daily between meals.    [provider]  lidocaine-prilocaine (EMLA) cream Apply a small amount to port a cath site (do not rub in) and cover with plastic wrap 1 hour prior to infusion appointments 09/15/21   Doreatha Massed, MD  LINZESS 72 MCG capsule TAKE 1 CAPSULE BY MOUTH DAILY BEFORE BREAKFAST. 12/13/22   Mansouraty, Netty Starring., MD  methocarbamol (ROBAXIN) 500 MG tablet Take 1 tablet (500 mg total) by mouth every 8 (eight) hours as needed for muscle spasms. 02/19/22   Fritzi Mandes, MD  metoprolol succinate (TOPROL-XL) 25 MG 24 hr tablet Take 0.5 tablets (12.5 mg total) by mouth daily after breakfast. 05/09/22   Darlin Drop, DO  mirtazapine (REMERON) 30 MG tablet Take 30 mg by mouth at bedtime. 06/15/21   [provider]  omeprazole (PRILOSEC) 40 MG capsule TAKE 1 CAPSULE (40 MG TOTAL) BY MOUTH DAILY. 10/19/22   Mansouraty, Netty Starring., MD  ondansetron (ZOFRAN-ODT) 8 MG disintegrating tablet Take 1 tablet (8 mg total) by mouth every 8 (eight) hours as needed for nausea or  vomiting. 07/21/21   Vassie Loll, MD  Polyethyl Glycol-Propyl Glycol (SYSTANE OP) Place 1 drop into both eyes at bedtime.    [provider]  potassium chloride SA (KLOR-CON M) 20 MEQ tablet One po bid x 3 days, then one po once a day Patient taking differently: Take 20 mEq by mouth daily. One po bid x 3 days, then one po once a day 04/28/22   Cathren Laine, MD  simethicone (MYLICON) 80 MG chewable tablet Chew 1 tablet (80 mg total) by mouth 4 (four) times daily. 05/09/22   Darlin Drop, DO  vitamin B-12  (CYANOCOBALAMIN) 500 MCG tablet Take 1 tablet (500 mcg total) by mouth daily. 07/22/21   Vassie Loll, MD      Allergies    Other, Cherry, and Wound dressing adhesive    Review of Systems   Review of Systems  Neurological:  Positive for weakness.    Physical Exam Updated Vital Signs BP 113/62   Pulse 76   Temp 98.1 F (36.7 C) (Oral)   Resp 17   Ht 5' 2.5" (1.588 m)   Wt 61.2 kg   SpO2 100%   BMI 24.30 kg/m  Physical Exam  ED Results / Procedures / Treatments   Labs (all labs ordered are listed, but only abnormal results are displayed) Labs Reviewed  CBC WITH DIFFERENTIAL/PLATELET - Abnormal; Notable for the following components:      Result Value   WBC 3.8 (*)    RBC 2.35 (*)    Hemoglobin 7.1 (*)    HCT 23.8 (*)    MCV 101.3 (*)    MCHC 29.8 (*)    RDW 16.9 (*)    Lymphs Abs 0.6 (*)    All other components within normal limits  COMPREHENSIVE METABOLIC PANEL - Abnormal; Notable for the following components:   BUN 33 (*)    Calcium 8.8 (*)    Total Protein 5.6 (*)    Albumin 3.1 (*)    All other components within normal limits  APTT - Abnormal; Notable for the following components:   aPTT 58 (*)    All other components within normal limits  I-STAT CHEM 8, ED - Abnormal; Notable for the following components:   BUN 28 (*)    Glucose, Bld 132 (*)    Calcium, Ion 1.08 (*)    TCO2 21 (*)    Hemoglobin 7.1 (*)    HCT 21.0 (*)    All other components within normal limits  PROTIME-INR  URINALYSIS, ROUTINE W REFLEX MICROSCOPIC  POC OCCULT BLOOD, ED  TYPE AND SCREEN  PREPARE RBC (CROSSMATCH)    EKG None  Radiology No results found.  Procedures Procedures  {Document cardiac monitor, telemetry assessment procedure when appropriate:1}  Medications Ordered in ED Medications  0.9 %  sodium chloride infusion (Manually program via Guardrails IV Fluids) (has no administration in time range)  coag fact Xa recombinant (ANDEXXA) low dose infusion 900 mg (has  no administration in time range)  pantoprazole (PROTONIX) injection 40 mg (40 mg Intravenous Given 12/27/22 1927)  sodium chloride 0.9 % bolus 1,000 mL (1,000 mLs Intravenous New Bag/Given 12/27/22 1929)    ED Course/ Medical Decision Making/ A&P   {I consulted GI and they agree with starting Protonix IV and making the patient n.p.o. after midnight and clear liquids till then.    CRITICAL CARE Performed by: Bethann Berkshire Total critical care time: 40 minutes Critical care time was exclusive of separately billable procedures and  treating other patients. Critical care was necessary to treat or prevent imminent or life-threatening deterioration. Critical care was time spent personally by me on the following activities: development of treatment plan with patient and/or surrogate as well as nursing, discussions with consultants, evaluation of patient's response to treatment, examination of patient, obtaining history from patient or surrogate, ordering and performing treatments and interventions, ordering and review of laboratory studies, ordering and review of radiographic studies, pulse oximetry and re-evaluation of patient's condition.  Click here for ABCD2, HEART and other calculatorsREFRESH Note before signing :1}                              Medical Decision Making Amount and/or Complexity of Data Reviewed Labs: ordered.  Risk Prescription drug management. Decision regarding hospitalization.   Patient with anemia and lower GI bleed.  She will be admitted to medicine and GI will see the patient tomorrow  {Document critical care time when appropriate:1} {Document review of labs and clinical decision tools ie heart score, Chads2Vasc2 etc:1}  {Document your independent review of radiology images, and any outside records:1} {Document your discussion with family members, caretakers, and with consultants:1} {Document social determinants of health affecting pt's care:1} {Document your  decision making why or why not admission, treatments were needed:1} Final Clinical Impression(s) / ED Diagnoses Final diagnoses:  Lower GI bleed    Rx / DC Orders ED Discharge Orders     None

## 2022-12-27 NOTE — ED Triage Notes (Signed)
Pt states her stool is maroon color, unknown for how long.  Pt with recent blood transfusion (11/21).  Pt with pancreatic CA.

## 2022-12-27 NOTE — ED Provider Triage Note (Signed)
Emergency Medicine Provider Triage Evaluation Note  Sheryl Bender , a 77 y.o. female  was evaluated in triage.  Pt complains of fatigue and SHOB. Had transfusion x 2 units on 12/16/22. Also did 4 weeks of iron infusions and B12 injection. States stools have been maroon since before her transfusion.  Sees [facility].  On Eliquis. No abd pain, no urinary symptoms.  Hx pancreatic ca, last chemo 05/2022  Review of Systems  Positive:  Negative:   Physical Exam  BP 120/61 (BP Location: Right Arm)   Pulse 79   Temp 98.1 F (36.7 C) (Oral)   Resp 16   Ht 5' 2.5" (1.588 m)   Wt 61.2 kg   SpO2 99%   BMI 24.30 kg/m  Gen:   Awake, no distress   Resp:  Normal effort  MSK:   Moves extremities without difficulty  Other:  pale  Medical Decision Making  Medically screening exam initiated at 1:14 PM.  Appropriate orders placed.  Joani Onstad was informed that the remainder of the evaluation will be completed by another provider, this initial triage assessment does not replace that evaluation, and the importance of remaining in the ED until their evaluation is complete.     Jeannie Fend, PA-C 12/27/22 1322

## 2022-12-27 NOTE — ED Notes (Signed)
Pt in waiting area with nad. A/o.

## 2022-12-27 NOTE — H&P (Signed)
History and Physical    [name] WUX:[id number] DOB: 12/08/1945 DOA: 12/27/2022  PCP: [name], DO  Patient coming from: Home  I have personally briefly reviewed patient's old medical records available.   Chief Complaint: Fatigue and weakness, low hemoglobin  HPI: Sheryl Bender is a 77 y.o. female with medical history significant of stage I pancreatic cancer previously treated with radiation and chemotherapy, anxiety, depression, pulmonary embolism diagnosed September 2022 currently on Eliquis therapy, chronic iron deficiency anemia, recent transfusion for hemoglobin of 6.1 in 11/22 presents back to the emergency room with progressive fatigue and weakness, dyspnea on exertion without any other symptoms.  Called hematology clinic.  She was asked to come to the ER.  Hemoglobin 7.1.  Hemodynamically stable.  Patient denies any nausea vomiting.  She is on omeprazole.  Her endoscopy during pancreatic biopsy last year did not show any ulcers.  She denied any hematemesis or hemoptysis.  Denied any melena or hematochezia.  She did notice some maroon-colored stool for the last few days. ED Course: Hemodynamically stable.  Hemoglobin 7.1.  She has mild pancytopenia.  Stool exam was consistent with positive FOBT.  Blood pressures are stable.  Given 2 units of PRBC.  1 dose of Protonix.  She was also given a dose of Kcentra.  GI was consulted and advised to keep n.p.o. past midnight.  Admission requested.  At the time of my interview, patient is resting in the bed and denies any complaints.  Review of Systems: all systems are reviewed and pertinent positive as per HPI otherwise rest are negative.    Past Medical History:  Diagnosis Date   Anxiety    Arthritis    Depression    Dysrhythmia    hx palpitations greater than 5 yrs -neg echo, stress per pt ? where or dr   GERD (gastroesophageal reflux disease)    occ   Nausea & vomiting 08/24/2021   Pancreatic adenocarcinoma (HCC)    Pancreatic  pseudocyst    Port-A-Cath in place 09/15/2021   Pulmonary embolism [facility])    September 2022    Past Surgical History:  Procedure Laterality Date   ANTERIOR LAT LUMBAR FUSION Right 12/16/2015   Procedure: RIGHT LUMBAR TWO-THREE, LUMBAR THREE-FOUR, LUMBAR FOUR-FIVE ANTEROLATERAL LUMBAR INTERBODY FUSION;  Surgeon: Maeola Harman, MD;  Location: [facility] OR;  Service: Neurosurgery;  Laterality: Right;   BALLOON DILATION N/A 08/27/2021   Procedure: BALLOON DILATION;  Surgeon: Meridee Score Netty Starring., MD;  Location: [facility] ENDOSCOPY;  Service: Gastroenterology;  Laterality: N/A;   BIOPSY  07/09/2021   Procedure: BIOPSY;  Surgeon: Meridee Score Netty Starring., MD;  Location: [facility] ENDOSCOPY;  Service: Gastroenterology;;   CYST GASTROSTOMY  08/27/2021   Procedure: CYST GASTROSTOMY;  Surgeon: Lemar Lofty., MD;  Location: [facility] ENDOSCOPY;  Service: Gastroenterology;;   ESOPHAGOGASTRODUODENOSCOPY N/A 07/09/2021   Procedure: ESOPHAGOGASTRODUODENOSCOPY (EGD);  Surgeon: Lemar Lofty., MD;  Location: [facility] ENDOSCOPY;  Service: Gastroenterology;  Laterality: N/A;   ESOPHAGOGASTRODUODENOSCOPY (EGD) WITH PROPOFOL N/A 08/27/2021   Procedure: ESOPHAGOGASTRODUODENOSCOPY (EGD) WITH PROPOFOL;  Surgeon: Meridee Score Netty Starring., MD;  Location: [facility] ENDOSCOPY;  Service: Gastroenterology;  Laterality: N/A;   ESOPHAGOGASTRODUODENOSCOPY (EGD) WITH PROPOFOL N/A 10/08/2021   Procedure: ESOPHAGOGASTRODUODENOSCOPY (EGD) WITH PROPOFOL;  Surgeon: Meridee Score Netty Starring., MD;  Location: [facility] ENDOSCOPY;  Service: Gastroenterology;  Laterality: N/A;   EUS N/A 07/09/2021   Procedure: UPPER ENDOSCOPIC ULTRASOUND (EUS) RADIAL;  Surgeon: Lemar Lofty., MD;  Location: [facility] ENDOSCOPY;  Service: Gastroenterology;  Laterality: N/A;   EUS N/A 08/27/2021  Procedure: UPPER ENDOSCOPIC ULTRASOUND (EUS) LINEAR;  Surgeon: Meridee Score Netty Starring., MD;  Location: [facility] ENDOSCOPY;  Service: Gastroenterology;  Laterality: N/A;   FINE NEEDLE ASPIRATION  07/09/2021    Procedure: FINE NEEDLE ASPIRATION (FNA) LINEAR;  Surgeon: Lemar Lofty., MD;  Location: [facility] ENDOSCOPY;  Service: Gastroenterology;;   IR IMAGING GUIDED PORT INSERTION  09/14/2021   LAPAROSCOPY N/A 02/15/2022   Procedure: STAGING DIAGNOSTIC;  Surgeon: Fritzi Mandes, MD;  Location: [facility] OR;  Service: General;  Laterality: N/A;   LUMBAR PERCUTANEOUS PEDICLE SCREW 3 LEVEL Bilateral 12/16/2015   Procedure: PERCUTANEOUS PEDICLE SCREWS BILATERALLY AT LUMBAR TWO-FIVE;  Surgeon: Maeola Harman, MD;  Location: [facility] OR;  Service: Neurosurgery;  Laterality: Bilateral;   PANCREATIC STENT PLACEMENT  08/27/2021   Procedure: PANCREATIC STENT PLACEMENT;  Surgeon: Lemar Lofty., MD;  Location: [facility] ENDOSCOPY;  Service: Gastroenterology;;   Francine Graven REMOVAL  10/08/2021   Procedure: STENT REMOVAL;  Surgeon: Lemar Lofty., MD;  Location: [facility] ENDOSCOPY;  Service: Gastroenterology;;   TUBAL LIGATION  1974   WHIPPLE PROCEDURE N/A 02/15/2022   Procedure: ATTEMPTED WHIPPLE PROCEDURE;  Surgeon: Fritzi Mandes, MD;  Location: [facility] OR;  Service: General;  Laterality: N/A;    Social history   reports that she has never smoked. She has never used smokeless tobacco. She reports that she does not drink alcohol and does not use drugs.  Allergies  Allergen Reactions   Other Hives and Other (See Comments)    Cherry wood just cut- "smelled it and broke out"; cannot tolerate ANY cherry fragrances, either      Cherry Hives   Wound Dressing Adhesive Rash and Other (See Comments)    Band-Aids = local reaction    Family History  Problem Relation Age of Onset   Pancreatitis Brother        alcoholic pancreatitis   Pancreatic cancer Neg Hx    Colon cancer Neg Hx      Prior to Admission medications   Medication Sig Start Date End Date Taking? Authorizing Provider  apixaban (ELIQUIS) 5 MG TABS tablet Take 5 mg by mouth 2 (two) times daily. 12/06/22 12/01/23 Yes [provider]  acetaminophen  (TYLENOL) 500 MG tablet Take 2 tablets (1,000 mg total) by mouth every 8 (eight) hours as needed for moderate pain, headache or fever. Patient taking differently: Take 750-1,000 mg by mouth every 8 (eight) hours as needed for moderate pain, headache or fever. 07/21/21   Vassie Loll, MD  Aromatic Inhalants (VICKS VAPOINHALER) INHA Inhale 1 puff into the lungs daily as needed (congestion).    [provider]  betamethasone dipropionate 0.05 % cream Apply topically 2 (two) times daily. 04/20/22   Doreatha Massed, MD  buPROPion (WELLBUTRIN XL) 300 MG 24 hr tablet Take 300 mg by mouth daily after breakfast. Takes differently from prescribed: 20mg  BID 06/15/21   [provider]  busPIRone (BUSPAR) 10 MG tablet Take 20 mg by mouth 3 (three) times daily. 01/27/21   [provider]  calcium carbonate (TUMS - DOSED IN MG ELEMENTAL CALCIUM) 500 MG chewable tablet Chew 1 tablet (200 mg of elemental calcium total) by mouth 2 (two) times daily as needed for indigestion or heartburn. Patient taking differently: Chew 1-2 tablets by mouth 2 (two) times daily as needed for indigestion or heartburn. 08/31/21   Lewie Chamber, MD  Cholecalciferol (VITAMIN D3) 125 MCG (5000 UT) CAPS Take 5,000 Units by mouth every other day.    [provider]  escitalopram (LEXAPRO) 20  MG tablet Take 20 mg by mouth at bedtime.    [provider]  fluorouracil CALGB 16109 1,920 mg/m2 in sodium chloride 0.9 % 150 mL Inject 1,920 mg/m2 into the vein over 48 hr.    [provider]  lactose free nutrition (BOOST) LIQD Take 237 mLs by mouth 2 (two) times daily between meals.    [provider]  lidocaine-prilocaine (EMLA) cream Apply a small amount to port a cath site (do not rub in) and cover with plastic wrap 1 hour prior to infusion appointments 09/15/21   Doreatha Massed, MD  LINZESS 72 MCG capsule TAKE 1 CAPSULE BY MOUTH DAILY BEFORE BREAKFAST. 12/13/22   Mansouraty,  Netty Starring., MD  methocarbamol (ROBAXIN) 500 MG tablet Take 1 tablet (500 mg total) by mouth every 8 (eight) hours as needed for muscle spasms. 02/19/22   Fritzi Mandes, MD  metoprolol succinate (TOPROL-XL) 25 MG 24 hr tablet Take 0.5 tablets (12.5 mg total) by mouth daily after breakfast. 05/09/22   Darlin Drop, DO  mirtazapine (REMERON) 30 MG tablet Take 30 mg by mouth at bedtime. 06/15/21   [provider]  omeprazole (PRILOSEC) 40 MG capsule TAKE 1 CAPSULE (40 MG TOTAL) BY MOUTH DAILY. 10/19/22   Mansouraty, Netty Starring., MD  ondansetron (ZOFRAN-ODT) 8 MG disintegrating tablet Take 1 tablet (8 mg total) by mouth every 8 (eight) hours as needed for nausea or vomiting. 07/21/21   Vassie Loll, MD  Polyethyl Glycol-Propyl Glycol (SYSTANE OP) Place 1 drop into both eyes at bedtime.    [provider]  potassium chloride SA (KLOR-CON M) 20 MEQ tablet One po bid x 3 days, then one po once a day Patient taking differently: Take 20 mEq by mouth daily. One po bid x 3 days, then one po once a day 04/28/22   Cathren Laine, MD  simethicone (MYLICON) 80 MG chewable tablet Chew 1 tablet (80 mg total) by mouth 4 (four) times daily. 05/09/22   Darlin Drop, DO  vitamin B-12 (CYANOCOBALAMIN) 500 MCG tablet Take 1 tablet (500 mcg total) by mouth daily. 07/22/21   Vassie Loll, MD    Physical Exam: Vitals:   12/27/22 1152 12/27/22 1633 12/27/22 1957 12/27/22 2013  BP: 120/61 113/63 91/78 113/62  Pulse: 79 77 76 76  Resp: 16 16 16 17   Temp: 98.1 F (36.7 C) 98.2 F (36.8 C) 97.8 F (36.6 C) 98.1 F (36.7 C)  TempSrc: Oral Oral Oral Oral  SpO2: 99% 100%    Weight:      Height:        Constitutional: NAD, calm, comfortable.pale Vitals:   12/27/22 1152 12/27/22 1633 12/27/22 1957 12/27/22 2013  BP: 120/61 113/63 91/78 113/62  Pulse: 79 77 76 76  Resp: 16 16 16 17   Temp: 98.1 F (36.7 C) 98.2 F (36.8 C) 97.8 F (36.6 C) 98.1 F (36.7 C)  TempSrc: Oral Oral Oral Oral  SpO2:  99% 100%    Weight:      Height:       Eyes: PERRL, lids and conjunctivae normal ENMT: Mucous membranes are moist. Posterior pharynx clear of any exudate or lesions.Normal dentition.  Neck: normal, supple, no masses, no thyromegaly Respiratory: clear to auscultation bilaterally, no wheezing, no crackles. Normal respiratory effort. No accessory muscle use.  Cardiovascular: Regular rate and rhythm, no murmurs / rubs / gallops. No extremity edema. 2+ pedal pulses. No carotid bruits.  Patient has a port on the right chest wall. Abdomen: no  tenderness, no masses palpated. No hepatosplenomegaly. Bowel sounds positive.  No point tenderness. Musculoskeletal: no clubbing / cyanosis. No joint deformity upper and lower extremities. Good ROM, no contractures. Normal muscle tone.  Skin: no rashes, lesions, ulcers. No induration Neurologic: CN 2-12 grossly intact. Sensation intact, DTR normal. Strength 5/5 in all 4.  Psychiatric: Normal judgment and insight. Alert and oriented x 3. Normal mood.     Labs on Admission: I have personally reviewed following labs and imaging studies  CBC: Recent Labs  Lab 12/27/22 1212 12/27/22 1935  WBC 3.8*  --   NEUTROABS 2.7  --   HGB 7.1* 7.1*  HCT 23.8* 21.0*  MCV 101.3*  --   PLT 217  --    Basic Metabolic Panel: Recent Labs  Lab 12/27/22 1212 12/27/22 1935  NA 139 139  K 4.2 3.9  CL 109 106  CO2 24  --   GLUCOSE 97 132*  BUN 33* 28*  CREATININE 0.70 0.70  CALCIUM 8.8*  --    GFR: Estimated Creatinine Clearance: 46.9 mL/min (by C-G formula based on SCr of 0.7 mg/dL). Liver Function Tests: Recent Labs  Lab 12/27/22 1212  AST 24  ALT 23  ALKPHOS 90  BILITOT 0.3  PROT 5.6*  ALBUMIN 3.1*   No results for input(s): "LIPASE", "AMYLASE" in the last 168 hours. No results for input(s): "AMMONIA" in the last 168 hours. Coagulation Profile: Recent Labs  Lab 12/27/22 1923  INR 1.1   Cardiac Enzymes: No results for input(s): "CKTOTAL",  "CKMB", "CKMBINDEX", "TROPONINI" in the last 168 hours. BNP (last 3 results) No results for input(s): "PROBNP" in the last 8760 hours. HbA1C: No results for input(s): "HGBA1C" in the last 72 hours. CBG: No results for input(s): "GLUCAP" in the last 168 hours. Lipid Profile: No results for input(s): "CHOL", "HDL", "LDLCALC", "TRIG", "CHOLHDL", "LDLDIRECT" in the last 72 hours. Thyroid Function Tests: No results for input(s): "TSH", "T4TOTAL", "FREET4", "T3FREE", "THYROIDAB" in the last 72 hours. Anemia Panel: No results for input(s): "VITAMINB12", "FOLATE", "FERRITIN", "TIBC", "IRON", "RETICCTPCT" in the last 72 hours. Urine analysis:    Component Value Date/Time   COLORURINE YELLOW 05/06/2022 1225   APPEARANCEUR CLEAR 05/06/2022 1225   LABSPEC 1.017 05/06/2022 1225   PHURINE 5.0 05/06/2022 1225   GLUCOSEU NEGATIVE 05/06/2022 1225   HGBUR NEGATIVE 05/06/2022 1225   BILIRUBINUR NEGATIVE 05/06/2022 1225   KETONESUR NEGATIVE 05/06/2022 1225   PROTEINUR NEGATIVE 05/06/2022 1225   NITRITE NEGATIVE 05/06/2022 1225   LEUKOCYTESUR NEGATIVE 05/06/2022 1225    Radiological Exams on Admission: No results found.   Assessment/Plan Principal Problem:   Symptomatic anemia Active Problems:   GERD (gastroesophageal reflux disease)   Essential hypertension   Malignant neoplasm of pancreas (HCC)     Acute on chronic iron deficiency anemia, suspect chronic blood loss anemia.  Suspect upper GI bleeding. Admit.  Clears.  N.p.o. past midnight.  2 units of PRBC tonight.  Recheck hemoglobin tomorrow morning.  Protonix 40 mg IV twice daily.  GI to consult.  Given severity anemia she will benefit with upper GI endoscopy.  2.  Pancreatic adenocarcinoma: On surveillance now.  Followed by oncology clinic.  3.  Pulmonary embolism on Eliquis: Last pulmonary embolism episode was in September 2022.  Currently reversed with Kcentra.  No indication to bridge.  Patient will need anticoagulation once stable  because she has thromboembolism associated with pancreatic cancer.  4. Chronic medical issues including Essential hypertension, on beta-blockers.  Will continue.  Blood pressures are  stable. Anxiety depression: Patient on multiple medications including Wellbutrin, BuSpar, Remeron at night, Celexa.  She will continue this.    DVT prophylaxis: SCDs Code Status: Full code Family Communication: None at the bedside Disposition Plan: Home when stable Consults called: Gastroenterology, called by ER Admission status: Inpatient.  MedSurg bed.   Dorcas Carrow MD [facility] Pager [phone]

## 2022-12-27 NOTE — Telephone Encounter (Signed)
Patient received blood transfusion on 11/21 for HGB of 6.2.  She called and states that she feels very weak and is passing maroon stools.  Advised to have someone bring her to the ER for evaluation.  Verbalized understanding and will present to [facility] ER.

## 2022-12-28 DIAGNOSIS — I10 Essential (primary) hypertension: Secondary | ICD-10-CM | POA: Diagnosis not present

## 2022-12-28 DIAGNOSIS — Z8719 Personal history of other diseases of the digestive system: Secondary | ICD-10-CM | POA: Diagnosis not present

## 2022-12-28 DIAGNOSIS — K921 Melena: Secondary | ICD-10-CM

## 2022-12-28 DIAGNOSIS — K2100 Gastro-esophageal reflux disease with esophagitis, without bleeding: Secondary | ICD-10-CM

## 2022-12-28 DIAGNOSIS — C250 Malignant neoplasm of head of pancreas: Secondary | ICD-10-CM

## 2022-12-28 DIAGNOSIS — Z86711 Personal history of pulmonary embolism: Secondary | ICD-10-CM

## 2022-12-28 DIAGNOSIS — D649 Anemia, unspecified: Secondary | ICD-10-CM | POA: Diagnosis not present

## 2022-12-28 LAB — CBC
HCT: 22.2 % — ABNORMAL LOW (ref 36.0–46.0)
HCT: 24.7 % — ABNORMAL LOW (ref 36.0–46.0)
Hemoglobin: 7 g/dL — ABNORMAL LOW (ref 12.0–15.0)
Hemoglobin: 7.7 g/dL — ABNORMAL LOW (ref 12.0–15.0)
MCH: 30.2 pg (ref 26.0–34.0)
MCH: 29.7 pg (ref 26.0–34.0)
MCHC: 31.5 g/dL (ref 30.0–36.0)
MCHC: 31.2 g/dL (ref 30.0–36.0)
MCV: 95.7 fL (ref 80.0–100.0)
MCV: 95.4 fL (ref 80.0–100.0)
Platelets: 177 10*3/uL (ref 150–400)
Platelets: 170 10*3/uL (ref 150–400)
RBC: 2.32 MIL/uL — ABNORMAL LOW (ref 3.87–5.11)
RBC: 2.59 MIL/uL — ABNORMAL LOW (ref 3.87–5.11)
RDW: 19.4 % — ABNORMAL HIGH (ref 11.5–15.5)
RDW: 19.2 % — ABNORMAL HIGH (ref 11.5–15.5)
WBC: 3.3 10*3/uL — ABNORMAL LOW (ref 4.0–10.5)
WBC: 3.8 10*3/uL — ABNORMAL LOW (ref 4.0–10.5)
nRBC: 0 % (ref 0.0–0.2)
nRBC: 0 % (ref 0.0–0.2)

## 2022-12-28 LAB — IRON AND TIBC
Iron: 50 ug/dL (ref 28–170)
Saturation Ratios: 18 % (ref 10.4–31.8)
TIBC: 271 ug/dL (ref 250–450)
UIBC: 221 ug/dL

## 2022-12-28 LAB — FERRITIN: Ferritin: 11 ng/mL (ref 11–307)

## 2022-12-28 LAB — RETICULOCYTES
Immature Retic Fract: 31.2 % — ABNORMAL HIGH (ref 2.3–15.9)
RBC.: 2.52 MIL/uL — ABNORMAL LOW (ref 3.87–5.11)
Retic Count, Absolute: 84.9 10*3/uL (ref 19.0–186.0)
Retic Ct Pct: 3.4 % — ABNORMAL HIGH (ref 0.4–3.1)

## 2022-12-28 LAB — GLUCOSE, CAPILLARY: Glucose-Capillary: 79 mg/dL (ref 70–99)

## 2022-12-28 LAB — LACTATE DEHYDROGENASE: LDH: 116 U/L (ref 98–192)

## 2022-12-28 LAB — FOLATE: Folate: 14.6 ng/mL (ref >5.9)

## 2022-12-28 LAB — VITAMIN B12: Vitamin B-12: 352 pg/mL (ref 180–914)

## 2022-12-28 MED ORDER — PEG 3350-KCL-NA BICARB-NACL 420 G PO SOLR
4000.0000 mL | Freq: Once | ORAL | Status: AC
  Administered 2022-12-28: 4000 mL via ORAL
  Filled 2022-12-28: qty 4000

## 2022-12-28 MED ORDER — QUETIAPINE FUMARATE 25 MG PO TABS
25.0000 mg | ORAL_TABLET | Freq: Two times a day (BID) | ORAL | Status: DC
  Administered 2022-12-28 – 2022-12-31 (×5): 25 mg via ORAL
  Filled 2022-12-28 (×5): qty 1

## 2022-12-28 MED ORDER — CHLORHEXIDINE GLUCONATE CLOTH 2 % EX PADS
6.0000 | MEDICATED_PAD | Freq: Every day | CUTANEOUS | Status: DC
Start: 2022-12-28 — End: 2022-12-31
  Administered 2022-12-28 – 2022-12-30 (×3): 6 via TOPICAL

## 2022-12-28 NOTE — H&P (View-Only) (Signed)
@LOGO @   Referring Provider: Bethann Berkshire, MD Primary Care Physician:  [name], DO Primary Gastroenterologist:  Dr. Jena Gauss  Date of Admission: 12/27/22 Date of Consultation: 12/28/22  Reason for Consultation:  Symptomatic anemia, GI bleed  HPI:  Sheryl Bender is a 77 y.o. year old female with medical history significant of stage I pancreatic cancer previously treated with radiation and chemotherapy and now just continued observation, anxiety, depression, pulmonary embolism diagnosed September 2022 currently on Eliquis, chronic iron deficiency anemia, recent outpatient transfusion of 2 units PRBCs on 11/21 for Hgb 6.2, who presented back to the ER on 12/2 with progressive fatigue and weakness, dyspnea on exertion. Also reported maroon colored stool recently.   ED course: Vital signs within normal limits. Hemoglobin 7.1. BUN elevated at 33 (persistent since April 2024) Fecal occult blood positive per H&P Received 1 unit PRBCs, IV Protonix 40 mg, Andexxa, and 1 L NS bolus.   She was admitted by the hospitalist, started on PPI twice daily, and made n.p.o. at midnight.  GI consulted for further evaluation.  Repeat CBC at 4:25 AM with hemoglobin of 7.0.  Today: Developed fatigue and increasing shortness of breath starting 11/15. Felt better after 2 units PRBCs but started going down hill shortly thereafter. Developed fatigue, short of breath when doing too much over the weekend. Stools have been dark maroon. Initially thought it was something she was eating or her medications. Maroon colored stools started a few weeks ago, possibly before 11/15. Was every bowel movement. Last BM was Sunday. Taking Lizness daily and this was keeping her bowels moving well. No abdominal pain. Had some morning nausea the middle of November but this has resolved. Reflux was controlled with Prilosec 40 mg daily.   NSAIDs: None  Last dose of Eliquis: Last dose morning of 12/2.  Weight has been  stable/increasing as she has been trying to gain weight. Eating more.   Last EGD 10/08/2021: - No gross lesions in esophagus. Z- line irregular, 33 cm from the incisors.  - 3 cm hiatal hernia.  - Erosive gastropathy with no bleeding and no stigmata of recent bleeding  - previously biopsied and negative for HP.  - Pre- existing AXIOS/ double pigtail cystgastrostomy stents were noted and removed.  - Pseudocyst cavity entered and has substantially decreased in size and has healthy wall appearance. Mild ooze noted after stent removal.  - No gross lesions in the duodenal bulb, in the first portion of the duodenum and in the second portion of the duodenum.  Last colonoscopy: Possibly as long as 10 years ago. No history of polyps. Was told she had a diverticulum. Performed in [place].   MRI with and without contrast 10/18/2022: Mildly progressive pancreatic duct dilation which again undergoes abrupt transition in the region of the pancreatic head without convincing evidence of underlying mass.  No evidence of metastatic disease, gastric underdistention with possible antral wall thickening possibly related to gastritis secondary to radiation.  Also with large colonic stool burden.  CT A/P with and without contrast 09/30/2022 with suspected 18 mm pancreatic head mass, new, with progressive pancreatic duct dilation raising concern for residual/recurrent pancreatic adenocarcinoma.  No evidence of metastatic disease.  PET 11/24/2022 with mild focal hypermetabolism in the pancreatic head raising concern for noncontour deforming lesion/recurrence.  Past Medical History:  Diagnosis Date   Anxiety    Arthritis    Depression    Dysrhythmia    hx palpitations greater than 5 yrs -neg echo, stress per pt ? where  or dr   GERD (gastroesophageal reflux disease)    occ   Nausea & vomiting 08/24/2021   Pancreatic adenocarcinoma (HCC)    Pancreatic pseudocyst    Port-A-Cath in place 09/15/2021   Pulmonary  embolism [facility])    September 2022    Past Surgical History:  Procedure Laterality Date   ANTERIOR LAT LUMBAR FUSION Right 12/16/2015   Procedure: RIGHT LUMBAR TWO-THREE, LUMBAR THREE-FOUR, LUMBAR FOUR-FIVE ANTEROLATERAL LUMBAR INTERBODY FUSION;  Surgeon: Maeola Harman, MD;  Location: [facility] OR;  Service: Neurosurgery;  Laterality: Right;   BALLOON DILATION N/A 08/27/2021   Procedure: BALLOON DILATION;  Surgeon: Meridee Score Netty Starring., MD;  Location: [facility] ENDOSCOPY;  Service: Gastroenterology;  Laterality: N/A;   BIOPSY  07/09/2021   Procedure: BIOPSY;  Surgeon: Meridee Score Netty Starring., MD;  Location: [facility] ENDOSCOPY;  Service: Gastroenterology;;   CYST GASTROSTOMY  08/27/2021   Procedure: CYST GASTROSTOMY;  Surgeon: Lemar Lofty., MD;  Location: [facility] ENDOSCOPY;  Service: Gastroenterology;;   ESOPHAGOGASTRODUODENOSCOPY N/A 07/09/2021   Procedure: ESOPHAGOGASTRODUODENOSCOPY (EGD);  Surgeon: Lemar Lofty., MD;  Location: [facility] ENDOSCOPY;  Service: Gastroenterology;  Laterality: N/A;   ESOPHAGOGASTRODUODENOSCOPY (EGD) WITH PROPOFOL N/A 08/27/2021   Procedure: ESOPHAGOGASTRODUODENOSCOPY (EGD) WITH PROPOFOL;  Surgeon: Meridee Score Netty Starring., MD;  Location: [facility] ENDOSCOPY;  Service: Gastroenterology;  Laterality: N/A;   ESOPHAGOGASTRODUODENOSCOPY (EGD) WITH PROPOFOL N/A 10/08/2021   Procedure: ESOPHAGOGASTRODUODENOSCOPY (EGD) WITH PROPOFOL;  Surgeon: Meridee Score Netty Starring., MD;  Location: [facility] ENDOSCOPY;  Service: Gastroenterology;  Laterality: N/A;   EUS N/A 07/09/2021   Procedure: UPPER ENDOSCOPIC ULTRASOUND (EUS) RADIAL;  Surgeon: Lemar Lofty., MD;  Location: [facility] ENDOSCOPY;  Service: Gastroenterology;  Laterality: N/A;   EUS N/A 08/27/2021   Procedure: UPPER ENDOSCOPIC ULTRASOUND (EUS) LINEAR;  Surgeon: Lemar Lofty., MD;  Location: [facility] ENDOSCOPY;  Service: Gastroenterology;  Laterality: N/A;   FINE NEEDLE ASPIRATION  07/09/2021   Procedure: FINE NEEDLE ASPIRATION (FNA) LINEAR;   Surgeon: Lemar Lofty., MD;  Location: [facility] ENDOSCOPY;  Service: Gastroenterology;;   IR IMAGING GUIDED PORT INSERTION  09/14/2021   LAPAROSCOPY N/A 02/15/2022   Procedure: STAGING DIAGNOSTIC;  Surgeon: Fritzi Mandes, MD;  Location: [facility] OR;  Service: General;  Laterality: N/A;   LUMBAR PERCUTANEOUS PEDICLE SCREW 3 LEVEL Bilateral 12/16/2015   Procedure: PERCUTANEOUS PEDICLE SCREWS BILATERALLY AT LUMBAR TWO-FIVE;  Surgeon: Maeola Harman, MD;  Location: [facility] OR;  Service: Neurosurgery;  Laterality: Bilateral;   PANCREATIC STENT PLACEMENT  08/27/2021   Procedure: PANCREATIC STENT PLACEMENT;  Surgeon: Lemar Lofty., MD;  Location: [facility] ENDOSCOPY;  Service: Gastroenterology;;   Francine Graven REMOVAL  10/08/2021   Procedure: STENT REMOVAL;  Surgeon: Lemar Lofty., MD;  Location: [facility] ENDOSCOPY;  Service: Gastroenterology;;   TUBAL LIGATION  1974   WHIPPLE PROCEDURE N/A 02/15/2022   Procedure: ATTEMPTED WHIPPLE PROCEDURE;  Surgeon: Fritzi Mandes, MD;  Location: [facility] OR;  Service: General;  Laterality: N/A;    Prior to Admission medications   Medication Sig Start Date End Date Taking? Authorizing Provider  apixaban (ELIQUIS) 5 MG TABS tablet Take 5 mg by mouth 2 (two) times daily. 12/06/22 12/01/23 Yes [provider]  acetaminophen (TYLENOL) 500 MG tablet Take 2 tablets (1,000 mg total) by mouth every 8 (eight) hours as needed for moderate pain, headache or fever. Patient taking differently: Take 750-1,000 mg by mouth every 8 (eight) hours as needed for moderate pain, headache or fever. 07/21/21   Vassie Loll, MD  Aromatic Inhalants (VICKS VAPOINHALER) INHA Inhale 1 puff into the lungs daily  as needed (congestion).    [provider]  betamethasone dipropionate 0.05 % cream Apply topically 2 (two) times daily. 04/20/22   Doreatha Massed, MD  buPROPion (WELLBUTRIN XL) 300 MG 24 hr tablet Take 300 mg by mouth daily after breakfast. Takes differently from prescribed: 20mg   BID 06/15/21   [provider]  busPIRone (BUSPAR) 10 MG tablet Take 20 mg by mouth 3 (three) times daily. 01/27/21   [provider]  calcium carbonate (TUMS - DOSED IN MG ELEMENTAL CALCIUM) 500 MG chewable tablet Chew 1 tablet (200 mg of elemental calcium total) by mouth 2 (two) times daily as needed for indigestion or heartburn. Patient taking differently: Chew 1-2 tablets by mouth 2 (two) times daily as needed for indigestion or heartburn. 08/31/21   Lewie Chamber, MD  Cholecalciferol (VITAMIN D3) 125 MCG (5000 UT) CAPS Take 5,000 Units by mouth every other day.    [provider]  escitalopram (LEXAPRO) 20 MG tablet Take 20 mg by mouth at bedtime.    [provider]  fluorouracil CALGB 40981 1,920 mg/m2 in sodium chloride 0.9 % 150 mL Inject 1,920 mg/m2 into the vein over 48 hr.    [provider]  lactose free nutrition (BOOST) LIQD Take 237 mLs by mouth 2 (two) times daily between meals.    [provider]  lidocaine-prilocaine (EMLA) cream Apply a small amount to port a cath site (do not rub in) and cover with plastic wrap 1 hour prior to infusion appointments 09/15/21   Doreatha Massed, MD  LINZESS 72 MCG capsule TAKE 1 CAPSULE BY MOUTH DAILY BEFORE BREAKFAST. 12/13/22   Mansouraty, Netty Starring., MD  methocarbamol (ROBAXIN) 500 MG tablet Take 1 tablet (500 mg total) by mouth every 8 (eight) hours as needed for muscle spasms. 02/19/22   Fritzi Mandes, MD  metoprolol succinate (TOPROL-XL) 25 MG 24 hr tablet Take 0.5 tablets (12.5 mg total) by mouth daily after breakfast. 05/09/22   Darlin Drop, DO  mirtazapine (REMERON) 30 MG tablet Take 30 mg by mouth at bedtime. 06/15/21   [provider]  omeprazole (PRILOSEC) 40 MG capsule TAKE 1 CAPSULE (40 MG TOTAL) BY MOUTH DAILY. 10/19/22   Mansouraty, Netty Starring., MD  ondansetron (ZOFRAN-ODT) 8 MG disintegrating tablet Take 1 tablet (8 mg total) by mouth every 8 (eight) hours as needed for  nausea or vomiting. 07/21/21   Vassie Loll, MD  Polyethyl Glycol-Propyl Glycol (SYSTANE OP) Place 1 drop into both eyes at bedtime.    [provider]  potassium chloride SA (KLOR-CON M) 20 MEQ tablet One po bid x 3 days, then one po once a day Patient taking differently: Take 20 mEq by mouth daily. One po bid x 3 days, then one po once a day 04/28/22   Cathren Laine, MD  simethicone (MYLICON) 80 MG chewable tablet Chew 1 tablet (80 mg total) by mouth 4 (four) times daily. 05/09/22   Darlin Drop, DO  vitamin B-12 (CYANOCOBALAMIN) 500 MCG tablet Take 1 tablet (500 mcg total) by mouth daily. 07/22/21   Vassie Loll, MD    Current Facility-Administered Medications  Medication Dose Route Frequency Provider Last Rate Last Admin   0.9 %  sodium chloride infusion (Manually program via Guardrails IV Fluids)   Intravenous Once Dorcas Carrow, MD       acetaminophen (TYLENOL) tablet 650 mg  650 mg Oral Q6H PRN Dorcas Carrow, MD       Or   acetaminophen (TYLENOL) suppository 650  mg  650 mg Rectal Q6H PRN Dorcas Carrow, MD       buPROPion (WELLBUTRIN XL) 24 hr tablet 300 mg  300 mg Oral QPC breakfast Dorcas Carrow, MD       busPIRone (BUSPAR) tablet 20 mg  20 mg Oral BID Dorcas Carrow, MD   20 mg at 12/27/22 2249   Chlorhexidine Gluconate Cloth 2 % PADS 6 each  6 each Topical Daily Almon Hercules, MD       escitalopram (LEXAPRO) tablet 20 mg  20 mg Oral QHS Dorcas Carrow, MD   20 mg at 12/27/22 2249   linaclotide (LINZESS) capsule 145 mcg  145 mcg Oral QAC breakfast Dorcas Carrow, MD       methocarbamol (ROBAXIN) tablet 500 mg  500 mg Oral Q8H PRN Dorcas Carrow, MD       metoprolol succinate (TOPROL-XL) 24 hr tablet 12.5 mg  12.5 mg Oral QPC breakfast Dorcas Carrow, MD       mirtazapine (REMERON) tablet 30 mg  30 mg Oral QHS Dorcas Carrow, MD   30 mg at 12/27/22 2249   ondansetron (ZOFRAN) tablet 4 mg  4 mg Oral Q6H PRN Dorcas Carrow, MD       Or   ondansetron (ZOFRAN) injection 4 mg   4 mg Intravenous Q6H PRN Dorcas Carrow, MD       pantoprazole (PROTONIX) injection 40 mg  40 mg Intravenous Q12H Dorcas Carrow, MD       polyvinyl alcohol (LIQUIFILM TEARS) 1.4 % ophthalmic solution 1 drop  1 drop Both Eyes QHS Ghimire, Lyndel Safe, MD       vitamin B-12 (CYANOCOBALAMIN) tablet 500 mcg  500 mcg Oral Daily Dorcas Carrow, MD        Allergies as of 12/27/2022 - Review Complete 12/27/2022  Allergen Reaction Noted   Other Hives and Other (See Comments) 12/08/2015   Cherry Hives 03/30/2021   Wound dressing adhesive Rash and Other (See Comments) 12/08/2015    Family History  Problem Relation Age of Onset   Pancreatitis Brother        alcoholic pancreatitis   Pancreatic cancer Neg Hx    Colon cancer Neg Hx     Social History   Socioeconomic History   Marital status: Widowed    Spouse name: Not on file   Number of children: Not on file   Years of education: Not on file   Highest education level: Not on file  Occupational History   Not on file  Tobacco Use   Smoking status: Never   Smokeless tobacco: Never  Vaping Use   Vaping status: Never Used  Substance and Sexual Activity   Alcohol use: No   Drug use: Never   Sexual activity: Not on file  Other Topics Concern   Not on file  Social History Narrative   Not on file   Social Determinants of Health   Financial Resource Strain: Not on file  Food Insecurity: No Food Insecurity (12/27/2022)   Hunger Vital Sign    Worried About Running Out of Food in the Last Year: Never true    Ran Out of Food in the Last Year: Never true  Transportation Needs: No Transportation Needs (12/27/2022)   PRAPARE - Administrator, Civil Service (Medical): No    Lack of Transportation (Non-Medical): No  Physical Activity: Not on file  Stress: Not on file  Social Connections: Not on file  Intimate Partner Violence: Not At Risk (12/27/2022)  Humiliation, Afraid, Rape, and Kick questionnaire    Fear of Current or  Ex-Partner: No    Emotionally Abused: No    Physically Abused: No    Sexually Abused: No    Review of Systems: Gen: Denies fever, chills, cold or flu like symptoms, pre-syncope, or syncope.  CV: Denies chest pain, heart palpitations. Resp: SOB with exertion.  GI:  See HPI GU : Denies urinary burning, urinary frequency, urinary incontinence.  MS: Denies joint pain. Reports some mild swelling in her hands today.  Derm: Denies rash. Psych: Admits to depression/anxiety.  Heme: See HPI   Physical Exam: Vital signs in last 24 hours: Temp:  [97.8 F (36.6 C)-98.2 F (36.8 C)] 98.1 F (36.7 C) (12/03 0800) Pulse Rate:  [69-80] 75 (12/03 0800) Resp:  [16-24] 18 (12/03 0443) BP: (91-144)/(49-101) 118/63 (12/03 0800) SpO2:  [89 %-100 %] 97 % (12/03 0800) Weight:  [61.2 kg-63.1 kg] 63.1 kg (12/02 2117) Last BM Date : 12/27/22 General:   Alert,  Well-developed, well-nourished, pleasant and cooperative in NAD Head:  Normocephalic and atraumatic. Eyes:  Sclera clear, no icterus.   Conjunctiva pink. Ears:  Normal auditory acuity. Nose:  No deformity, discharge,  or lesions. Lungs:  Clear throughout to auscultation.   No wheezes, crackles, or rhonchi. No acute distress. Heart:  Regular rate and rhythm; no murmurs, clicks, rubs,  or gallops. Abdomen:  Soft, nontender and nondistended. No masses, hepatosplenomegaly or hernias noted. Normal bowel sounds, without guarding, and without rebound.   Rectal:  Deferred until time of colonoscopy.   Msk:  Symmetrical without gross deformities. Normal posture. Extremities:  Without edema. Neurologic:  Alert and  oriented x4;  grossly normal neurologically. Skin:  Intact without significant lesions or rashes. Psych:  Normal mood and affect.  Intake/Output from previous day: 12/02 0701 - 12/03 0700 In: 191 [I.V.:15; Blood:176] Out: -  Intake/Output this shift: No intake/output data recorded.  Lab Results: Recent Labs    12/27/22 1212  12/27/22 1935 12/28/22 0425  WBC 3.8*  --  3.3*  HGB 7.1* 7.1* 7.0*  HCT 23.8* 21.0* 22.2*  PLT 217  --  177   BMET Recent Labs    12/27/22 1212 12/27/22 1935  NA 139 139  K 4.2 3.9  CL 109 106  CO2 24  --   GLUCOSE 97 132*  BUN 33* 28*  CREATININE 0.70 0.70  CALCIUM 8.8*  --    LFT Recent Labs    12/27/22 1212  PROT 5.6*  ALBUMIN 3.1*  AST 24  ALT 23  ALKPHOS 90  BILITOT 0.3   PT/INR Recent Labs    12/27/22 1923  LABPROT 14.8  INR 1.1   Hepatitis Panel No results for input(s): "HEPBSAG", "HCVAB", "HEPAIGM", "HEPBIGM" in the last 72 hours.   Studies/Results: No results found.  Impression: 77 y.o. year old female with medical history significant of stage I pancreatic cancer previously treated with radiation and chemotherapy and now just continued observation, anxiety, depression, pulmonary embolism diagnosed September 2022 currently on Eliquis, chronic iron deficiency anemia, recent outpatient transfusion of 2 units PRBCs on 11/21 for Hgb 6.2, who presented back to the ER on 12/2 with progressive fatigue and weakness, dyspnea on exertion, maroon colored, admitted for symptomatic anemia with GI bleed.   Symptomatic anemia/maroon colored stool:  Prior baseline hemoglobin in the 10-11 range.  Hemoglobin down to 7.0 on 11/15 which is around the time that she started noticing maroon-colored stool but did not find anyone of this  as she thought that it may be related to medications.  Received 2 units PRBCs on 11/21 in the outpatient setting for hemoglobin down to 6.2.  When she presented to the ER yesterday, hemoglobin was 7.1.  Reports her last maroon-colored stool was 2 days ago.  No similar symptoms in the past.  No associated abdominal pain.  Last colonoscopy at least 10 years ago in [place].  Denies any history of polyps but does report diverticulosis.  No recent unintentional weight loss.  Last EGD September 2023 with erosive gastropathy.  She denies NSAID use  though she does take Eliquis with last dose morning of 12/2.  She received 1 unit PRBCs overnight and hemoglobin has improved to 7.7.   At this point, I have recommended colonoscopy for further evaluation with possible upper endoscopy if unrevealing. Etiology of maroon-colored stool is not clear, but suspect this is coming from a lower GI source with differentials including diverticular bleeding in the setting of Eliquis, colon polyps, malignancy. However, with mildly elevated BUN unable to rule out upper GI source contributing to anemia.    Plan: Clear liquid diet today. N.p.o. at midnight. Plan for colonoscopy with possible EGD with Dr. Jena Gauss tomorrow. The risks, benefits, and alternatives have been discussed with the patient in detail. The patient states understanding and desires to proceed.  Continue to hold Eliquis. Continue IV PPI twice daily for now.   LOS: 1 day    12/28/2022, 9:18 AM   Ermalinda Memos, PA-C [facility]

## 2022-12-28 NOTE — Plan of Care (Signed)

## 2022-12-28 NOTE — Progress Notes (Signed)
PROGRESS NOTE  Sheryl Bender ZOX:[id number] DOB: August 08, 1945   PCP: [name], DO  Patient is from: Home.  Independently ambulates at baseline.  DOA: 12/27/2022 LOS: 1  Chief complaints Chief Complaint  Patient presents with   GI Bleeding     Brief Narrative / Interim history: 77 year old F with PMH of stage I pancreatic cancer s/p chemoradiation currently on surveillance, PE in 2022 currently on Eliquis, iron deficiency anemia for which she receives iron infusions, HTN, anxiety and depression directed to ED by her hematologist after she reported generalized weakness, fatigue and DOE, and admitted for symptomatic iron deficiency anemia in the setting of possible chronic GI bleed.  She reported marijuana stool 4 weeks.  Her hemoglobin of 6.2 on 11/20.  It seems like she received 2 units on 11/21.  In ED, HDS.  Hgb 7.1 (baseline about 9-10).  Hemoccult positive.  2 units of blood ordered.  Started on IV Protonix and received Kcentra.  GI consulted, and admission requested for symptomatic anemia.  Subjective: Seen and examined earlier this morning.  No major events overnight of this morning.  Transfused 1 unit.  Hemoglobin improved to 7.7 this morning.  She is not sure about her symptoms since she has not gotten out of the bed yet.  Denies dizziness, chest pain, palpitation or shortness or breath.  Denies NSAID use.  Objective: Vitals:   12/27/22 2117 12/27/22 2125 12/28/22 0443 12/28/22 0800  BP: (!) 144/75 137/70 (!) 103/49 118/63  Pulse: 80 75 69 75  Resp: 17 18 18    Temp: 97.8 F (36.6 C) 98.2 F (36.8 C) 98.2 F (36.8 C) 98.1 F (36.7 C)  TempSrc: Oral Oral Oral Oral  SpO2: 100% 99% 97% 97%  Weight: 63.1 kg     Height: 5\' 2"  (1.575 m)       Examination:  GENERAL: No apparent distress.  Nontoxic. HEENT: MMM.  Vision and hearing grossly intact.  NECK: Supple.  No apparent JVD.  RESP:  No IWOB.  Fair aeration bilaterally. CVS:  RRR. Heart sounds normal.  ABD/GI/GU:  BS+. Abd soft, NTND.  MSK/EXT:  Moves extremities. No apparent deformity. No edema.  SKIN: no apparent skin lesion or wound NEURO: Awake, alert and oriented appropriately.  No apparent focal neuro deficit. PSYCH: Calm. Normal affect.   Procedures:  None  Microbiology summarized: None  Assessment and plan: Acute on chronic iron deficiency anemia, suspect acute on chronic blood loss anemia due to Eliquis.  Baseline Hgb appears to be in the range of 9-10.  Seems she has 2 units of blood on 11/21 when Hgb was down to 6.2.  She also receives periodic iron infusion and B12 injection.  Last anemia panel on 11/15 with iron and B12 deficiency.  Eliquis reversed in ED.  Has normal bilirubin arguing against hemolysis.  Her BUN is slightly elevated.  Recent Labs    05/11/22 1259 05/18/22 1436 06/22/22 1105 09/30/22 1300 12/10/22 1058 12/15/22 1252 12/27/22 1212 12/27/22 1935 12/28/22 0425 12/28/22 1011  HGB 9.6* 10.4* 10.8* 9.5* 7.0* 6.2* 7.1* 7.1* 7.0* 7.7*  -Transfused second unit. -Monitor H&H -Continue IV Protonix -Recheck anemia panel, LDH and haptoglobin -Continue holding Eliquis -Appreciate input by GI-plan for colonoscopy and endoscopy on 12/14.  Pulmonary embolism on Eliquis: Last episode in 09/2020.  Currently reversed with Kcentra.  No indication to bridge.  Patient will need anticoagulation once stable because she has thromboembolism associated with pancreatic cancer. -Continue holding Eliquis pending evaluation.  Pancreatic adenocarcinoma: S/p chemo and radiation.  On surveillance now.  Followed by oncology clinic.   Essential hypertension: Stable -Continue home metoprolol  Anxiety depression: Stable -Continue home Wellbutrin, Celexa, BuSpar and Remeron   Constipation: On Linzess.   Body mass index is 25.44 kg/m.           DVT prophylaxis:  SCDs Start: 12/27/22 2135  Code Status: Full code Family Communication: None at bedside Level of care:  Med-Surg Status is: Inpatient Remains inpatient appropriate because: Iron deficiency anemia due to acute on chronic GI bleed   Final disposition: Likely home once medically cleared Consultants:  Gastroenterology  55 minutes with more than 50% spent in reviewing records, counseling patient/family and coordinating care.   Sch Meds:  Scheduled Meds:  sodium chloride   Intravenous Once   buPROPion  300 mg Oral QPC breakfast   busPIRone  20 mg Oral BID   Chlorhexidine Gluconate Cloth  6 each Topical Daily   escitalopram  20 mg Oral QHS   linaclotide  145 mcg Oral QAC breakfast   metoprolol succinate  12.5 mg Oral QPC breakfast   mirtazapine  30 mg Oral QHS   pantoprazole (PROTONIX) IV  40 mg Intravenous Q12H   polyvinyl alcohol  1 drop Both Eyes QHS   cyanocobalamin  500 mcg Oral Daily   Continuous Infusions: PRN Meds:.acetaminophen **OR** acetaminophen, methocarbamol, ondansetron **OR** ondansetron (ZOFRAN) IV  Antimicrobials: Anti-infectives (From admission, onward)    None        I have personally reviewed the following labs and images: CBC: Recent Labs  Lab 12/27/22 1212 12/27/22 1935 12/28/22 0425 12/28/22 1011  WBC 3.8*  --  3.3* 3.8*  NEUTROABS 2.7  --   --   --   HGB 7.1* 7.1* 7.0* 7.7*  HCT 23.8* 21.0* 22.2* 24.7*  MCV 101.3*  --  95.7 95.4  PLT 217  --  177 170   BMP &GFR Recent Labs  Lab 12/27/22 1212 12/27/22 1935  NA 139 139  K 4.2 3.9  CL 109 106  CO2 24  --   GLUCOSE 97 132*  BUN 33* 28*  CREATININE 0.70 0.70  CALCIUM 8.8*  --    Estimated Creatinine Clearance: 50.6 mL/min (by C-G formula based on SCr of 0.7 mg/dL). Liver & Pancreas: Recent Labs  Lab 12/27/22 1212  AST 24  ALT 23  ALKPHOS 90  BILITOT 0.3  PROT 5.6*  ALBUMIN 3.1*   No results for input(s): "LIPASE", "AMYLASE" in the last 168 hours. No results for input(s): "AMMONIA" in the last 168 hours. Diabetic: No results for input(s): "HGBA1C" in the last 72  hours. Recent Labs  Lab 12/28/22 0749  GLUCAP 79   Cardiac Enzymes: No results for input(s): "CKTOTAL", "CKMB", "CKMBINDEX", "TROPONINI" in the last 168 hours. No results for input(s): "PROBNP" in the last 8760 hours. Coagulation Profile: Recent Labs  Lab 12/27/22 1923  INR 1.1   Thyroid Function Tests: No results for input(s): "TSH", "T4TOTAL", "FREET4", "T3FREE", "THYROIDAB" in the last 72 hours. Lipid Profile: No results for input(s): "CHOL", "HDL", "LDLCALC", "TRIG", "CHOLHDL", "LDLDIRECT" in the last 72 hours. Anemia Panel: No results for input(s): "VITAMINB12", "FOLATE", "FERRITIN", "TIBC", "IRON", "RETICCTPCT" in the last 72 hours. Urine analysis:    Component Value Date/Time   COLORURINE YELLOW 05/06/2022 1225   APPEARANCEUR CLEAR 05/06/2022 1225   LABSPEC 1.017 05/06/2022 1225   PHURINE 5.0 05/06/2022 1225   GLUCOSEU NEGATIVE 05/06/2022 1225   HGBUR NEGATIVE 05/06/2022 1225   BILIRUBINUR NEGATIVE 05/06/2022 1225  KETONESUR NEGATIVE 05/06/2022 1225   PROTEINUR NEGATIVE 05/06/2022 1225   NITRITE NEGATIVE 05/06/2022 1225   LEUKOCYTESUR NEGATIVE 05/06/2022 1225   Sepsis Labs: Invalid input(s): "PROCALCITONIN", "LACTICIDVEN"  Microbiology: No results found for this or any previous visit (from the past 240 hour(s)).  Radiology Studies: No results found.    Anfernee Peschke T. Kainat Pizana [facility]  If 7PM-7AM, please contact night-coverage [email or web address] 12/28/2022, 10:56 AM

## 2022-12-28 NOTE — Consult Note (Signed)
@LOGO @   Referring Provider: Bethann Berkshire, MD Primary Care Physician:  [name], DO Primary Gastroenterologist:  Dr. Jena Gauss  Date of Admission: 12/27/22 Date of Consultation: 12/28/22  Reason for Consultation:  Symptomatic anemia, GI bleed  HPI:  Sheryl Bender is a 77 y.o. year old female with medical history significant of stage I pancreatic cancer previously treated with radiation and chemotherapy and now just continued observation, anxiety, depression, pulmonary embolism diagnosed September 2022 currently on Eliquis, chronic iron deficiency anemia, recent outpatient transfusion of 2 units PRBCs on 11/21 for Hgb 6.2, who presented back to the ER on 12/2 with progressive fatigue and weakness, dyspnea on exertion. Also reported maroon colored stool recently.   ED course: Vital signs within normal limits. Hemoglobin 7.1. BUN elevated at 33 (persistent since April 2024) Fecal occult blood positive per H&P Received 1 unit PRBCs, IV Protonix 40 mg, Andexxa, and 1 L NS bolus.   She was admitted by the hospitalist, started on PPI twice daily, and made n.p.o. at midnight.  GI consulted for further evaluation.  Repeat CBC at 4:25 AM with hemoglobin of 7.0.  Today: Developed fatigue and increasing shortness of breath starting 11/15. Felt better after 2 units PRBCs but started going down hill shortly thereafter. Developed fatigue, short of breath when doing too much over the weekend. Stools have been dark maroon. Initially thought it was something she was eating or her medications. Maroon colored stools started a few weeks ago, possibly before 11/15. Was every bowel movement. Last BM was Sunday. Taking Lizness daily and this was keeping her bowels moving well. No abdominal pain. Had some morning nausea the middle of November but this has resolved. Reflux was controlled with Prilosec 40 mg daily.   NSAIDs: None  Last dose of Eliquis: Last dose morning of 12/2.  Weight has been  stable/increasing as she has been trying to gain weight. Eating more.   Last EGD 10/08/2021: - No gross lesions in esophagus. Z- line irregular, 33 cm from the incisors.  - 3 cm hiatal hernia.  - Erosive gastropathy with no bleeding and no stigmata of recent bleeding  - previously biopsied and negative for HP.  - Pre- existing AXIOS/ double pigtail cystgastrostomy stents were noted and removed.  - Pseudocyst cavity entered and has substantially decreased in size and has healthy wall appearance. Mild ooze noted after stent removal.  - No gross lesions in the duodenal bulb, in the first portion of the duodenum and in the second portion of the duodenum.  Last colonoscopy: Possibly as long as 10 years ago. No history of polyps. Was told she had a diverticulum. Performed in [place].   MRI with and without contrast 10/18/2022: Mildly progressive pancreatic duct dilation which again undergoes abrupt transition in the region of the pancreatic head without convincing evidence of underlying mass.  No evidence of metastatic disease, gastric underdistention with possible antral wall thickening possibly related to gastritis secondary to radiation.  Also with large colonic stool burden.  CT A/P with and without contrast 09/30/2022 with suspected 18 mm pancreatic head mass, new, with progressive pancreatic duct dilation raising concern for residual/recurrent pancreatic adenocarcinoma.  No evidence of metastatic disease.  PET 11/24/2022 with mild focal hypermetabolism in the pancreatic head raising concern for noncontour deforming lesion/recurrence.  Past Medical History:  Diagnosis Date   Anxiety    Arthritis    Depression    Dysrhythmia    hx palpitations greater than 5 yrs -neg echo, stress per pt ? where  or dr   GERD (gastroesophageal reflux disease)    occ   Nausea & vomiting 08/24/2021   Pancreatic adenocarcinoma (HCC)    Pancreatic pseudocyst    Port-A-Cath in place 09/15/2021   Pulmonary  embolism [facility])    September 2022    Past Surgical History:  Procedure Laterality Date   ANTERIOR LAT LUMBAR FUSION Right 12/16/2015   Procedure: RIGHT LUMBAR TWO-THREE, LUMBAR THREE-FOUR, LUMBAR FOUR-FIVE ANTEROLATERAL LUMBAR INTERBODY FUSION;  Surgeon: Maeola Harman, MD;  Location: [facility] OR;  Service: Neurosurgery;  Laterality: Right;   BALLOON DILATION N/A 08/27/2021   Procedure: BALLOON DILATION;  Surgeon: Meridee Score Netty Starring., MD;  Location: [facility] ENDOSCOPY;  Service: Gastroenterology;  Laterality: N/A;   BIOPSY  07/09/2021   Procedure: BIOPSY;  Surgeon: Meridee Score Netty Starring., MD;  Location: [facility] ENDOSCOPY;  Service: Gastroenterology;;   CYST GASTROSTOMY  08/27/2021   Procedure: CYST GASTROSTOMY;  Surgeon: Lemar Lofty., MD;  Location: [facility] ENDOSCOPY;  Service: Gastroenterology;;   ESOPHAGOGASTRODUODENOSCOPY N/A 07/09/2021   Procedure: ESOPHAGOGASTRODUODENOSCOPY (EGD);  Surgeon: Lemar Lofty., MD;  Location: [facility] ENDOSCOPY;  Service: Gastroenterology;  Laterality: N/A;   ESOPHAGOGASTRODUODENOSCOPY (EGD) WITH PROPOFOL N/A 08/27/2021   Procedure: ESOPHAGOGASTRODUODENOSCOPY (EGD) WITH PROPOFOL;  Surgeon: Meridee Score Netty Starring., MD;  Location: [facility] ENDOSCOPY;  Service: Gastroenterology;  Laterality: N/A;   ESOPHAGOGASTRODUODENOSCOPY (EGD) WITH PROPOFOL N/A 10/08/2021   Procedure: ESOPHAGOGASTRODUODENOSCOPY (EGD) WITH PROPOFOL;  Surgeon: Meridee Score Netty Starring., MD;  Location: [facility] ENDOSCOPY;  Service: Gastroenterology;  Laterality: N/A;   EUS N/A 07/09/2021   Procedure: UPPER ENDOSCOPIC ULTRASOUND (EUS) RADIAL;  Surgeon: Lemar Lofty., MD;  Location: [facility] ENDOSCOPY;  Service: Gastroenterology;  Laterality: N/A;   EUS N/A 08/27/2021   Procedure: UPPER ENDOSCOPIC ULTRASOUND (EUS) LINEAR;  Surgeon: Lemar Lofty., MD;  Location: [facility] ENDOSCOPY;  Service: Gastroenterology;  Laterality: N/A;   FINE NEEDLE ASPIRATION  07/09/2021   Procedure: FINE NEEDLE ASPIRATION (FNA) LINEAR;   Surgeon: Lemar Lofty., MD;  Location: [facility] ENDOSCOPY;  Service: Gastroenterology;;   IR IMAGING GUIDED PORT INSERTION  09/14/2021   LAPAROSCOPY N/A 02/15/2022   Procedure: STAGING DIAGNOSTIC;  Surgeon: Fritzi Mandes, MD;  Location: [facility] OR;  Service: General;  Laterality: N/A;   LUMBAR PERCUTANEOUS PEDICLE SCREW 3 LEVEL Bilateral 12/16/2015   Procedure: PERCUTANEOUS PEDICLE SCREWS BILATERALLY AT LUMBAR TWO-FIVE;  Surgeon: Maeola Harman, MD;  Location: [facility] OR;  Service: Neurosurgery;  Laterality: Bilateral;   PANCREATIC STENT PLACEMENT  08/27/2021   Procedure: PANCREATIC STENT PLACEMENT;  Surgeon: Lemar Lofty., MD;  Location: [facility] ENDOSCOPY;  Service: Gastroenterology;;   Francine Graven REMOVAL  10/08/2021   Procedure: STENT REMOVAL;  Surgeon: Lemar Lofty., MD;  Location: [facility] ENDOSCOPY;  Service: Gastroenterology;;   TUBAL LIGATION  1974   WHIPPLE PROCEDURE N/A 02/15/2022   Procedure: ATTEMPTED WHIPPLE PROCEDURE;  Surgeon: Fritzi Mandes, MD;  Location: [facility] OR;  Service: General;  Laterality: N/A;    Prior to Admission medications   Medication Sig Start Date End Date Taking? Authorizing Provider  apixaban (ELIQUIS) 5 MG TABS tablet Take 5 mg by mouth 2 (two) times daily. 12/06/22 12/01/23 Yes [provider]  acetaminophen (TYLENOL) 500 MG tablet Take 2 tablets (1,000 mg total) by mouth every 8 (eight) hours as needed for moderate pain, headache or fever. Patient taking differently: Take 750-1,000 mg by mouth every 8 (eight) hours as needed for moderate pain, headache or fever. 07/21/21   Vassie Loll, MD  Aromatic Inhalants (VICKS VAPOINHALER) INHA Inhale 1 puff into the lungs daily  as needed (congestion).    [provider]  betamethasone dipropionate 0.05 % cream Apply topically 2 (two) times daily. 04/20/22   Doreatha Massed, MD  buPROPion (WELLBUTRIN XL) 300 MG 24 hr tablet Take 300 mg by mouth daily after breakfast. Takes differently from prescribed: 20mg   BID 06/15/21   [provider]  busPIRone (BUSPAR) 10 MG tablet Take 20 mg by mouth 3 (three) times daily. 01/27/21   [provider]  calcium carbonate (TUMS - DOSED IN MG ELEMENTAL CALCIUM) 500 MG chewable tablet Chew 1 tablet (200 mg of elemental calcium total) by mouth 2 (two) times daily as needed for indigestion or heartburn. Patient taking differently: Chew 1-2 tablets by mouth 2 (two) times daily as needed for indigestion or heartburn. 08/31/21   Lewie Chamber, MD  Cholecalciferol (VITAMIN D3) 125 MCG (5000 UT) CAPS Take 5,000 Units by mouth every other day.    [provider]  escitalopram (LEXAPRO) 20 MG tablet Take 20 mg by mouth at bedtime.    [provider]  fluorouracil CALGB 40981 1,920 mg/m2 in sodium chloride 0.9 % 150 mL Inject 1,920 mg/m2 into the vein over 48 hr.    [provider]  lactose free nutrition (BOOST) LIQD Take 237 mLs by mouth 2 (two) times daily between meals.    [provider]  lidocaine-prilocaine (EMLA) cream Apply a small amount to port a cath site (do not rub in) and cover with plastic wrap 1 hour prior to infusion appointments 09/15/21   Doreatha Massed, MD  LINZESS 72 MCG capsule TAKE 1 CAPSULE BY MOUTH DAILY BEFORE BREAKFAST. 12/13/22   Mansouraty, Netty Starring., MD  methocarbamol (ROBAXIN) 500 MG tablet Take 1 tablet (500 mg total) by mouth every 8 (eight) hours as needed for muscle spasms. 02/19/22   Fritzi Mandes, MD  metoprolol succinate (TOPROL-XL) 25 MG 24 hr tablet Take 0.5 tablets (12.5 mg total) by mouth daily after breakfast. 05/09/22   Darlin Drop, DO  mirtazapine (REMERON) 30 MG tablet Take 30 mg by mouth at bedtime. 06/15/21   [provider]  omeprazole (PRILOSEC) 40 MG capsule TAKE 1 CAPSULE (40 MG TOTAL) BY MOUTH DAILY. 10/19/22   Mansouraty, Netty Starring., MD  ondansetron (ZOFRAN-ODT) 8 MG disintegrating tablet Take 1 tablet (8 mg total) by mouth every 8 (eight) hours as needed for  nausea or vomiting. 07/21/21   Vassie Loll, MD  Polyethyl Glycol-Propyl Glycol (SYSTANE OP) Place 1 drop into both eyes at bedtime.    [provider]  potassium chloride SA (KLOR-CON M) 20 MEQ tablet One po bid x 3 days, then one po once a day Patient taking differently: Take 20 mEq by mouth daily. One po bid x 3 days, then one po once a day 04/28/22   Cathren Laine, MD  simethicone (MYLICON) 80 MG chewable tablet Chew 1 tablet (80 mg total) by mouth 4 (four) times daily. 05/09/22   Darlin Drop, DO  vitamin B-12 (CYANOCOBALAMIN) 500 MCG tablet Take 1 tablet (500 mcg total) by mouth daily. 07/22/21   Vassie Loll, MD    Current Facility-Administered Medications  Medication Dose Route Frequency Provider Last Rate Last Admin   0.9 %  sodium chloride infusion (Manually program via Guardrails IV Fluids)   Intravenous Once Dorcas Carrow, MD       acetaminophen (TYLENOL) tablet 650 mg  650 mg Oral Q6H PRN Dorcas Carrow, MD       Or   acetaminophen (TYLENOL) suppository 650  mg  650 mg Rectal Q6H PRN Dorcas Carrow, MD       buPROPion (WELLBUTRIN XL) 24 hr tablet 300 mg  300 mg Oral QPC breakfast Dorcas Carrow, MD       busPIRone (BUSPAR) tablet 20 mg  20 mg Oral BID Dorcas Carrow, MD   20 mg at 12/27/22 2249   Chlorhexidine Gluconate Cloth 2 % PADS 6 each  6 each Topical Daily Almon Hercules, MD       escitalopram (LEXAPRO) tablet 20 mg  20 mg Oral QHS Dorcas Carrow, MD   20 mg at 12/27/22 2249   linaclotide (LINZESS) capsule 145 mcg  145 mcg Oral QAC breakfast Dorcas Carrow, MD       methocarbamol (ROBAXIN) tablet 500 mg  500 mg Oral Q8H PRN Dorcas Carrow, MD       metoprolol succinate (TOPROL-XL) 24 hr tablet 12.5 mg  12.5 mg Oral QPC breakfast Dorcas Carrow, MD       mirtazapine (REMERON) tablet 30 mg  30 mg Oral QHS Dorcas Carrow, MD   30 mg at 12/27/22 2249   ondansetron (ZOFRAN) tablet 4 mg  4 mg Oral Q6H PRN Dorcas Carrow, MD       Or   ondansetron (ZOFRAN) injection 4 mg   4 mg Intravenous Q6H PRN Dorcas Carrow, MD       pantoprazole (PROTONIX) injection 40 mg  40 mg Intravenous Q12H Dorcas Carrow, MD       polyvinyl alcohol (LIQUIFILM TEARS) 1.4 % ophthalmic solution 1 drop  1 drop Both Eyes QHS Ghimire, Lyndel Safe, MD       vitamin B-12 (CYANOCOBALAMIN) tablet 500 mcg  500 mcg Oral Daily Dorcas Carrow, MD        Allergies as of 12/27/2022 - Review Complete 12/27/2022  Allergen Reaction Noted   Other Hives and Other (See Comments) 12/08/2015   Cherry Hives 03/30/2021   Wound dressing adhesive Rash and Other (See Comments) 12/08/2015    Family History  Problem Relation Age of Onset   Pancreatitis Brother        alcoholic pancreatitis   Pancreatic cancer Neg Hx    Colon cancer Neg Hx     Social History   Socioeconomic History   Marital status: Widowed    Spouse name: Not on file   Number of children: Not on file   Years of education: Not on file   Highest education level: Not on file  Occupational History   Not on file  Tobacco Use   Smoking status: Never   Smokeless tobacco: Never  Vaping Use   Vaping status: Never Used  Substance and Sexual Activity   Alcohol use: No   Drug use: Never   Sexual activity: Not on file  Other Topics Concern   Not on file  Social History Narrative   Not on file   Social Determinants of Health   Financial Resource Strain: Not on file  Food Insecurity: No Food Insecurity (12/27/2022)   Hunger Vital Sign    Worried About Running Out of Food in the Last Year: Never true    Ran Out of Food in the Last Year: Never true  Transportation Needs: No Transportation Needs (12/27/2022)   PRAPARE - Administrator, Civil Service (Medical): No    Lack of Transportation (Non-Medical): No  Physical Activity: Not on file  Stress: Not on file  Social Connections: Not on file  Intimate Partner Violence: Not At Risk (12/27/2022)  Humiliation, Afraid, Rape, and Kick questionnaire    Fear of Current or  Ex-Partner: No    Emotionally Abused: No    Physically Abused: No    Sexually Abused: No    Review of Systems: Gen: Denies fever, chills, cold or flu like symptoms, pre-syncope, or syncope.  CV: Denies chest pain, heart palpitations. Resp: SOB with exertion.  GI:  See HPI GU : Denies urinary burning, urinary frequency, urinary incontinence.  MS: Denies joint pain. Reports some mild swelling in her hands today.  Derm: Denies rash. Psych: Admits to depression/anxiety.  Heme: See HPI   Physical Exam: Vital signs in last 24 hours: Temp:  [97.8 F (36.6 C)-98.2 F (36.8 C)] 98.1 F (36.7 C) (12/03 0800) Pulse Rate:  [69-80] 75 (12/03 0800) Resp:  [16-24] 18 (12/03 0443) BP: (91-144)/(49-101) 118/63 (12/03 0800) SpO2:  [89 %-100 %] 97 % (12/03 0800) Weight:  [61.2 kg-63.1 kg] 63.1 kg (12/02 2117) Last BM Date : 12/27/22 General:   Alert,  Well-developed, well-nourished, pleasant and cooperative in NAD Head:  Normocephalic and atraumatic. Eyes:  Sclera clear, no icterus.   Conjunctiva pink. Ears:  Normal auditory acuity. Nose:  No deformity, discharge,  or lesions. Lungs:  Clear throughout to auscultation.   No wheezes, crackles, or rhonchi. No acute distress. Heart:  Regular rate and rhythm; no murmurs, clicks, rubs,  or gallops. Abdomen:  Soft, nontender and nondistended. No masses, hepatosplenomegaly or hernias noted. Normal bowel sounds, without guarding, and without rebound.   Rectal:  Deferred until time of colonoscopy.   Msk:  Symmetrical without gross deformities. Normal posture. Extremities:  Without edema. Neurologic:  Alert and  oriented x4;  grossly normal neurologically. Skin:  Intact without significant lesions or rashes. Psych:  Normal mood and affect.  Intake/Output from previous day: 12/02 0701 - 12/03 0700 In: 191 [I.V.:15; Blood:176] Out: -  Intake/Output this shift: No intake/output data recorded.  Lab Results: Recent Labs    12/27/22 1212  12/27/22 1935 12/28/22 0425  WBC 3.8*  --  3.3*  HGB 7.1* 7.1* 7.0*  HCT 23.8* 21.0* 22.2*  PLT 217  --  177   BMET Recent Labs    12/27/22 1212 12/27/22 1935  NA 139 139  K 4.2 3.9  CL 109 106  CO2 24  --   GLUCOSE 97 132*  BUN 33* 28*  CREATININE 0.70 0.70  CALCIUM 8.8*  --    LFT Recent Labs    12/27/22 1212  PROT 5.6*  ALBUMIN 3.1*  AST 24  ALT 23  ALKPHOS 90  BILITOT 0.3   PT/INR Recent Labs    12/27/22 1923  LABPROT 14.8  INR 1.1   Hepatitis Panel No results for input(s): "HEPBSAG", "HCVAB", "HEPAIGM", "HEPBIGM" in the last 72 hours.   Studies/Results: No results found.  Impression: 77 y.o. year old female with medical history significant of stage I pancreatic cancer previously treated with radiation and chemotherapy and now just continued observation, anxiety, depression, pulmonary embolism diagnosed September 2022 currently on Eliquis, chronic iron deficiency anemia, recent outpatient transfusion of 2 units PRBCs on 11/21 for Hgb 6.2, who presented back to the ER on 12/2 with progressive fatigue and weakness, dyspnea on exertion, maroon colored, admitted for symptomatic anemia with GI bleed.   Symptomatic anemia/maroon colored stool:  Prior baseline hemoglobin in the 10-11 range.  Hemoglobin down to 7.0 on 11/15 which is around the time that she started noticing maroon-colored stool but did not find anyone of this  as she thought that it may be related to medications.  Received 2 units PRBCs on 11/21 in the outpatient setting for hemoglobin down to 6.2.  When she presented to the ER yesterday, hemoglobin was 7.1.  Reports her last maroon-colored stool was 2 days ago.  No similar symptoms in the past.  No associated abdominal pain.  Last colonoscopy at least 10 years ago in [place].  Denies any history of polyps but does report diverticulosis.  No recent unintentional weight loss.  Last EGD September 2023 with erosive gastropathy.  She denies NSAID use  though she does take Eliquis with last dose morning of 12/2.  She received 1 unit PRBCs overnight and hemoglobin has improved to 7.7.   At this point, I have recommended colonoscopy for further evaluation with possible upper endoscopy if unrevealing. Etiology of maroon-colored stool is not clear, but suspect this is coming from a lower GI source with differentials including diverticular bleeding in the setting of Eliquis, colon polyps, malignancy. However, with mildly elevated BUN unable to rule out upper GI source contributing to anemia.    Plan: Clear liquid diet today. N.p.o. at midnight. Plan for colonoscopy with possible EGD with Dr. Jena Gauss tomorrow. The risks, benefits, and alternatives have been discussed with the patient in detail. The patient states understanding and desires to proceed.  Continue to hold Eliquis. Continue IV PPI twice daily for now.   LOS: 1 day    12/28/2022, 9:18 AM   Ermalinda Memos, PA-C [facility]

## 2022-12-28 NOTE — TOC Initial Note (Signed)
Transition of Care [facility]) - Initial/Assessment Note    Patient Details  Name: Sheryl Bender MRN: [id number] Date of Birth: Mar 20, 1945  Transition of Care [facility]) CM/SW Contact:    Villa Herb, LCSWA Phone Number: 12/28/2022, 12:56 PM  Clinical Narrative:                 Pt is listed as high risk for readmission. CSW spoke with pt at bedside to complete assessment. Pt lives alone. Pt is able to complete ADLs independently. Pt has a cane to use when outside of the home. Pt has transportation to appointments. Pt has had HH in the past but not currently. TOC signing off.   Expected Discharge Plan: Home/Self Care Barriers to Discharge: Continued Medical Work up   Patient Goals and CMS Choice Patient states their goals for this hospitalization and ongoing recovery are:: return home CMS Medicare.gov Compare Post Acute Care list provided to:: Patient Choice offered to / list presented to : Patient      Expected Discharge Plan and Services In-house Referral: Clinical Social Work Discharge Planning Services: CM Consult   Living arrangements for the past 2 months: Apartment                                      Prior Living Arrangements/Services Living arrangements for the past 2 months: Apartment Lives with:: Self Patient language and need for interpreter reviewed:: Yes Do you feel safe going back to the place where you live?: Yes      Need for Family Participation in Patient Care: Yes (Comment) Care giver support system in place?: Yes (comment) Current home services: DME Criminal Activity/Legal Involvement Pertinent to Current Situation/Hospitalization: No - Comment as needed  Activities of Daily Living   ADL Screening (condition at time of admission) Independently performs ADLs?: Yes (appropriate for developmental age) Is the patient deaf or have difficulty hearing?: No Does the patient have difficulty seeing, even when wearing glasses/contacts?: No Does the patient  have difficulty concentrating, remembering, or making decisions?: No  Permission Sought/Granted                  Emotional Assessment Appearance:: Appears stated age Attitude/Demeanor/Rapport: Engaged Affect (typically observed): Accepting Orientation: : Oriented to Self, Oriented to Place, Oriented to  Time, Oriented to Situation Alcohol / Substance Use: Not Applicable Psych Involvement: No (comment)  Admission diagnosis:  Lower GI bleed [K92.2] Symptomatic anemia [D64.9] Patient Active Problem List   Diagnosis Date Noted   Symptomatic anemia 12/27/2022   Intractable nausea and vomiting 05/06/2022   Adynamic ileus (HCC) 05/06/2022   Diarrhea 05/06/2022   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   Pancreatic cancer (HCC) 02/15/2022   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   Genetic testing 09/23/2021   Port-A-Cath in place 09/15/2021   Malnutrition of moderate degree 08/25/2021   Epigastric pain 08/24/2021   Nausea & vomiting 08/24/2021   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   Physical deconditioning    Malignant neoplasm of pancreas (HCC)    Iron deficiency anemia 07/15/2021   Essential hypertension 07/15/2021   Constipation    Pancreatitis, acute 07/13/2021   Hypokalemia 07/13/2021   GERD (gastroesophageal reflux disease) 07/13/2021   Depression 07/13/2021   Dilation of pancreatic duct 01/28/2021   Colon cancer screening 01/28/2021   Lumbar scoliosis 12/16/2015   PCP:  [name], DO Pharmacy:   [facility] [phone] - [place], [place] - [address]  [address] [place] [address] Phone: [phone] Fax: [phone]     Social Determinants of Health (SDOH) Social History: SDOH Screenings   Food Insecurity: No Food Insecurity (12/27/2022)  Housing: Patient Declined (12/27/2022)  Transportation Needs: No Transportation Needs (12/27/2022)  Utilities: Not At Risk (12/27/2022)  Tobacco Use: Low Risk  (12/27/2022)   SDOH Interventions:      Readmission Risk Interventions    07/17/2021    1:22 PM  Readmission Risk Prevention Plan  Transportation Screening Complete  Home Care Screening Complete  Medication Review (RN CM) Complete

## 2022-12-29 ENCOUNTER — Inpatient Hospital Stay (HOSPITAL_COMMUNITY): Payer: Medicare Other | Admitting: Certified Registered Nurse Anesthetist

## 2022-12-29 ENCOUNTER — Encounter (HOSPITAL_COMMUNITY)
Admission: EM | Disposition: A | Discharge status: Home / Self Care | Payer: Self-pay | Source: Home / Self Care | Attending: Internal Medicine

## 2022-12-29 DIAGNOSIS — K922 Gastrointestinal hemorrhage, unspecified: Secondary | ICD-10-CM

## 2022-12-29 DIAGNOSIS — D649 Anemia, unspecified: Secondary | ICD-10-CM | POA: Diagnosis not present

## 2022-12-29 DIAGNOSIS — K449 Diaphragmatic hernia without obstruction or gangrene: Secondary | ICD-10-CM

## 2022-12-29 DIAGNOSIS — K3182 Dieulafoy lesion (hemorrhagic) of stomach and duodenum: Secondary | ICD-10-CM | POA: Diagnosis not present

## 2022-12-29 DIAGNOSIS — D125 Benign neoplasm of sigmoid colon: Secondary | ICD-10-CM | POA: Diagnosis not present

## 2022-12-29 DIAGNOSIS — D509 Iron deficiency anemia, unspecified: Secondary | ICD-10-CM

## 2022-12-29 DIAGNOSIS — K921 Melena: Secondary | ICD-10-CM | POA: Diagnosis not present

## 2022-12-29 HISTORY — PX: HOT HEMOSTASIS: SHX5433

## 2022-12-29 HISTORY — PX: POLYPECTOMY: SHX149

## 2022-12-29 HISTORY — PX: HEMOSTASIS CLIP PLACEMENT: SHX6857

## 2022-12-29 HISTORY — PX: COLONOSCOPY WITH PROPOFOL: SHX5780

## 2022-12-29 HISTORY — PX: ESOPHAGOGASTRODUODENOSCOPY (EGD) WITH PROPOFOL: SHX5813

## 2022-12-29 LAB — TYPE AND SCREEN
ABO/RH(D): A POS
Antibody Screen: NEGATIVE
Unit division: 0
Unit division: 0

## 2022-12-29 LAB — CBC
HCT: 26.7 % — ABNORMAL LOW (ref 36.0–46.0)
Hemoglobin: 8.8 g/dL — ABNORMAL LOW (ref 12.0–15.0)
MCH: 31 pg (ref 26.0–34.0)
MCHC: 33 g/dL (ref 30.0–36.0)
MCV: 94 fL (ref 80.0–100.0)
Platelets: 161 10*3/uL (ref 150–400)
RBC: 2.84 MIL/uL — ABNORMAL LOW (ref 3.87–5.11)
RDW: 17.8 % — ABNORMAL HIGH (ref 11.5–15.5)
WBC: 3.6 10*3/uL — ABNORMAL LOW (ref 4.0–10.5)
nRBC: 0 % (ref 0.0–0.2)

## 2022-12-29 LAB — COMPREHENSIVE METABOLIC PANEL
ALT: 24 U/L (ref 0–44)
AST: 27 U/L (ref 15–41)
Albumin: 2.6 g/dL — ABNORMAL LOW (ref 3.5–5.0)
Alkaline Phosphatase: 83 U/L (ref 38–126)
Anion gap: 6 (ref 5–15)
BUN: 17 mg/dL (ref 8–23)
CO2: 24 mmol/L (ref 22–32)
Calcium: 8.5 mg/dL — ABNORMAL LOW (ref 8.9–10.3)
Chloride: 109 mmol/L (ref 98–111)
Creatinine, Ser: 0.71 mg/dL (ref 0.44–1.00)
GFR, Estimated: >60 mL/min (ref >60)
Glucose, Bld: 84 mg/dL (ref 70–99)
Potassium: 3.6 mmol/L (ref 3.5–5.1)
Sodium: 139 mmol/L (ref 135–145)
Total Bilirubin: 0.5 mg/dL (ref <1.2)
Total Protein: 4.8 g/dL — ABNORMAL LOW (ref 6.5–8.1)

## 2022-12-29 LAB — BPAM RBC
Blood Product Expiration Date: 202412272359
Blood Product Expiration Date: 202412202359
ISSUE DATE / TIME: 202412021937
ISSUE DATE / TIME: 202412031852
Unit Type and Rh: 202412272359
Unit Type and Rh: 6200
Unit Type and Rh: 6200

## 2022-12-29 LAB — MAGNESIUM: Magnesium: 2 mg/dL (ref 1.7–2.4)

## 2022-12-29 LAB — HAPTOGLOBIN: Haptoglobin: 86 mg/dL (ref 42–346)

## 2022-12-29 LAB — GLUCOSE, CAPILLARY: Glucose-Capillary: 95 mg/dL (ref 70–99)

## 2022-12-29 SURGERY — COLONOSCOPY WITH PROPOFOL
Anesthesia: Monitor Anesthesia Care

## 2022-12-29 MED ORDER — PROPOFOL 10 MG/ML IV BOLUS
INTRAVENOUS | Status: DC | PRN
  Administered 2022-12-29: 125 ug/kg/min via INTRAVENOUS
  Administered 2022-12-29: 60 mg via INTRAVENOUS

## 2022-12-29 MED ORDER — SODIUM CHLORIDE 0.9 % IV SOLN
INTRAVENOUS | Status: DC
Start: 2022-12-29 — End: 2022-12-29

## 2022-12-29 MED ORDER — PHENYLEPHRINE 80 MCG/ML (10ML) SYRINGE FOR IV PUSH (FOR BLOOD PRESSURE SUPPORT)
PREFILLED_SYRINGE | INTRAVENOUS | Status: DC | PRN
  Administered 2022-12-29 (×3): 80 ug via INTRAVENOUS

## 2022-12-29 MED ORDER — LACTATED RINGERS IV SOLN: INTRAVENOUS | Status: DC | PRN

## 2022-12-29 MED ORDER — LIDOCAINE HCL (PF) 2 % IJ SOLN
INTRAMUSCULAR | Status: DC | PRN
  Administered 2022-12-29: 50 mg via INTRADERMAL

## 2022-12-29 NOTE — Anesthesia Postprocedure Evaluation (Signed)
Anesthesia Post Note  [name]  Procedure(s) Performed: COLONOSCOPY WITH PROPOFOL ESOPHAGOGASTRODUODENOSCOPY (EGD) WITH PROPOFOL HOT HEMOSTASIS (ARGON PLASMA COAGULATION/BICAP) HEMOSTASIS CLIP PLACEMENT POLYPECTOMY INTESTINAL  Patient location during evaluation: PACU Anesthesia Type: MAC Level of consciousness: awake and alert Pain management: pain level controlled Vital Signs Assessment: post-procedure vital signs reviewed and stable Respiratory status: spontaneous breathing, nonlabored ventilation, respiratory function stable and patient connected to nasal cannula oxygen Cardiovascular status: blood pressure returned to baseline and stable Postop Assessment: no apparent nausea or vomiting Anesthetic complications: no   There were no known notable events for this encounter.   Last Vitals:  Vitals:   12/29/22 1545 12/29/22 1600  BP: 98/62 123/67  Pulse: 71 71  Resp: (!) 22 13  Temp: 36.7 C 36.5 C  SpO2: 98% 99%    Last Pain:  Vitals:   12/29/22 1600  TempSrc:   PainSc: 0-No pain                 Makayia Duplessis L Julea Hutto

## 2022-12-29 NOTE — Progress Notes (Signed)
PROGRESS NOTE    Sheryl Bender  XLK:[id number] DOB: 1945/04/21 DOA: 12/27/2022 PCP: [name], DO    Brief Narrative:  77 year old with history of stage I pancreatic cancer status post chemoradiation currently on surveillance, pulmonary embolism since 2022 currently on Eliquis, chronic iron-deficiency anemia, hypertension anxiety admitted with symptomatic anemia.  Hemoglobin 7.1.  Recent transfusion as outpatient.  She was given 2 units of PRBC transfusion with appropriate response.  Waiting for colonoscopy and EGD today.  Subjective: Patient seen and examined.  She feels weak due to multiple bathroom runs.  Stool were clear.  No evidence of ongoing bleeding.  For colonoscopy and likely EGD today. Assessment & Plan:   Acute on chronic antisense anemia: Baseline hemoglobin 9-10. Hemoglobin 7.1-2 units of PRBC-7.7.  Currently no evidence of ongoing bleeding.  Remains on Protonix.  Bowel prep.  Colonoscopy and EGD today.  Further management will depend on findings. Iron and B12 levels are adequate.  Pulmonary embolism on Eliquis: Last episode of PE was in 09/2020.  Eliquis is held.  Likely she can go back on Eliquis if no major bleeding.  Chronic medical issues including Pancreatic adenocarcinoma, under surveillance.  Followed by oncology. Essential hypertension, on metoprolol Anxiety and depression, on Wellbutrin Celexa BuSpar and Remeron.   DVT prophylaxis: SCDs Start: 12/27/22 2135   Code Status: Full code Family Communication: None at bedside Disposition Plan: Status is: Inpatient Remains inpatient appropriate because: Inpatient procedures planned     Consultants:  Gastroenterology  Procedures:  Planned  Antimicrobials:  None     Objective: Vitals:   12/28/22 1941 12/28/22 2130 12/29/22 0428 12/29/22 0933  BP: 114/70 112/66 (!) 110/58 123/61  Pulse: 92 90 69 73  Resp: 16 16 16 15   Temp: 98.2 F (36.8 C) 98.1 F (36.7 C) 98 F (36.7 C) 98.4 F (36.9 C)   TempSrc: Oral Oral Oral Oral  SpO2: 99% 99% 96% 99%  Weight:   62.4 kg   Height:   5\' 2"  (1.575 m)     Intake/Output Summary (Last 24 hours) at 12/29/2022 1131 Last data filed at 12/28/2022 2130 Gross per 24 hour  Intake 372.5 ml  Output --  Net 372.5 ml   Filed Weights   12/27/22 1150 12/27/22 2117 12/29/22 0428  Weight: 61.2 kg 63.1 kg 62.4 kg    Examination:  General exam: Appears calm and comfortable  Respiratory system: Clear to auscultation. Respiratory effort normal. Patient has a port on the right chest wall. Cardiovascular system: S1 & S2 heard, RRR.  Gastrointestinal system: Abdomen is nondistended, soft and nontender. No organomegaly or masses felt. Normal bowel sounds heard. Central nervous system: Alert and oriented. No focal neurological deficits. Extremities: Symmetric 5 x 5 power.    Data Reviewed: I have personally reviewed following labs and imaging studies  CBC: Recent Labs  Lab 12/27/22 1212 12/27/22 1935 12/28/22 0425 12/28/22 1011 12/29/22 0423  WBC 3.8*  --  3.3* 3.8* 3.6*  NEUTROABS 2.7  --   --   --   --   HGB 7.1* 7.1* 7.0* 7.7* 8.8*  HCT 23.8* 21.0* 22.2* 24.7* 26.7*  MCV 101.3*  --  95.7 95.4 94.0  PLT 217  --  177 170 161   Basic Metabolic Panel: Recent Labs  Lab 12/27/22 1212 12/27/22 1935 12/29/22 0423  NA 139 139 139  K 4.2 3.9 3.6  CL 109 106 109  CO2 24  --  24  GLUCOSE 97 132* 84  BUN 33* 28* 17  CREATININE 0.70 0.70 0.71  CALCIUM 8.8*  --  8.5*  MG  --   --  2.0   GFR: Estimated Creatinine Clearance: 50.3 mL/min (by C-G formula based on SCr of 0.71 mg/dL). Liver Function Tests: Recent Labs  Lab 12/27/22 1212 12/29/22 0423  AST 24 27  ALT 23 24  ALKPHOS 90 83  BILITOT 0.3 0.5  PROT 5.6* 4.8*  ALBUMIN 3.1* 2.6*   No results for input(s): "LIPASE", "AMYLASE" in the last 168 hours. No results for input(s): "AMMONIA" in the last 168 hours. Coagulation Profile: Recent Labs  Lab 12/27/22 1923  INR 1.1    Cardiac Enzymes: No results for input(s): "CKTOTAL", "CKMB", "CKMBINDEX", "TROPONINI" in the last 168 hours. BNP (last 3 results) No results for input(s): "PROBNP" in the last 8760 hours. HbA1C: No results for input(s): "HGBA1C" in the last 72 hours. CBG: Recent Labs  Lab 12/28/22 0749 12/29/22 0739  GLUCAP 79 95   Lipid Profile: No results for input(s): "CHOL", "HDL", "LDLCALC", "TRIG", "CHOLHDL", "LDLDIRECT" in the last 72 hours. Thyroid Function Tests: No results for input(s): "TSH", "T4TOTAL", "FREET4", "T3FREE", "THYROIDAB" in the last 72 hours. Anemia Panel: Recent Labs    12/28/22 0425 12/28/22 1011  VITAMINB12 352  --   FOLATE 14.6  --   FERRITIN 11  --   TIBC 271  --   IRON 50  --   RETICCTPCT  --  3.4*   Sepsis Labs: No results for input(s): "PROCALCITON", "LATICACIDVEN" in the last 168 hours.  No results found for this or any previous visit (from the past 240 hour(s)).       Radiology Studies: No results found.      Scheduled Meds:  sodium chloride   Intravenous Once   buPROPion  300 mg Oral QPC breakfast   busPIRone  20 mg Oral BID   Chlorhexidine Gluconate Cloth  6 each Topical Daily   escitalopram  20 mg Oral QHS   linaclotide  145 mcg Oral QAC breakfast   metoprolol succinate  12.5 mg Oral QPC breakfast   mirtazapine  30 mg Oral QHS   pantoprazole (PROTONIX) IV  40 mg Intravenous Q12H   polyvinyl alcohol  1 drop Both Eyes QHS   QUEtiapine  25 mg Oral BID   cyanocobalamin  500 mcg Oral Daily   Continuous Infusions:   LOS: 2 days    Time spent: 35 minutes    Dorcas Carrow, MD [facility]

## 2022-12-29 NOTE — Op Note (Signed)
[facility] Patient Name: Sheryl Bender Procedure Date: 12/29/2022 2:42 PM MRN: [id number] Date of Birth: 07/09/1945 Attending MD: Gennette Pac , MD, [id number] CSN: [id number] Age: 77 Admit Type: Inpatient Procedure:                Upper GI endoscopy Indications:              Hematochezia Providers:                Gennette Pac, MD, Sheran Fava,                            Kristine L. Jessee Avers, Technician Referring MD:              Medicines:                Propofol per Anesthesia Complications:            No immediate complications. Estimated Blood Loss:     Estimated blood loss was minimal. Procedure:                Pre-Anesthesia Assessment:                           - Prior to the procedure, a History and Physical                            was performed, and patient medications and                            allergies were reviewed. The patient's tolerance of                            previous anesthesia was also reviewed. The risks                            and benefits of the procedure and the sedation                            options and risks were discussed with the patient.                            All questions were answered, and informed consent                            was obtained. Prior Anticoagulants: The patient                            last took Eliquis (apixaban) 2 days prior to the                            procedure. ASA Grade Assessment: III - A patient                            with severe systemic disease. After reviewing the  risks and benefits, the patient was deemed in                            satisfactory condition to undergo the procedure.                           After obtaining informed consent, the endoscope was                            passed under direct vision. Throughout the                            procedure, the patient's blood pressure, pulse, and                             oxygen saturations were monitored continuously. The                            [id number]-[id number] ([id number]) scope was introduced through the                            mouth, and advanced to the second part of duodenum.                            The upper GI endoscopy was accomplished without                            difficulty. The patient tolerated the procedure                            well. Scope In: 3:06:21 PM Scope Out: 3:24:21 PM Total Procedure Duration: 0 hours 18 minutes 0 seconds  Findings:      The examined esophagus was normal.      A small hiatal hernia was present. Examination of bulb and second       portion revealed no proximal duodenal abnormalities      However, a thorough examination of the gastric mucosa revealed (2)       actively bleeding areas of mucosa to the right and left of the pyloric       channel in the antrum. Please see photos. These were clearly discrete       areas of active bleeding simultaneously.      These areas were both sealed with gold probe application.. Finally, the       vessels were further sealed with 1 mantis clip each. 2 other small areas       with slight oozing in the immediate prepyloric mucosa were sealed with       gold probe application. Impression:               - Normal esophagus. Antral Dielafoy's with active                            bleeding. Sealed with gold probe and clipped                           -  Small hiatal hernia.                           -Normal-appearing duodenal bulb and second portion                           -Patient likely has abnormal blood vessels in the                            underlying mucosa, in part, due to radiation and                            neoplastic process itself Moderate Sedation:      Moderate (conscious) sedation was personally administered by an       anesthesia professional. The following parameters were monitored: oxygen       saturation, heart rate, blood pressure, respiratory rate,  EKG, adequacy       of pulmonary ventilation, and response to care. Recommendation:           - Clear liquid diet.                           - Continue present medications.                           -Add Carafate 1 g slurry 4 times daily x 5 days                           - Return patient to hospital ward for ongoing care.                            If continued anticoagulation is felt to be needed,                            would not resume sooner than 12/10.                           -See colonoscopy report Procedure Code(s):        --- Professional ---                           [phone], Esophagogastroduodenoscopy, flexible,                            transoral; diagnostic, including collection of                            specimen(s) by brushing or washing, when performed                            (separate procedure) Diagnosis Code(s):        --- Professional ---                           K44.9, Diaphragmatic hernia without obstruction or  gangrene                           K92.1, Melena (includes Hematochezia) CPT copyright 2022 [employer]. All rights reserved. The codes documented in this report are preliminary and upon coder review may  be revised to meet current compliance requirements. Gerrit Friends. Brodrick Curran, MD Gennette Pac, MD 12/29/2022 3:55:33 PM This report has been signed electronically. Number of Addenda: 0

## 2022-12-29 NOTE — Plan of Care (Signed)

## 2022-12-29 NOTE — Transfer of Care (Signed)
Immediate Anesthesia Transfer of Care Note  [name]  Procedure(s) Performed: COLONOSCOPY WITH PROPOFOL ESOPHAGOGASTRODUODENOSCOPY (EGD) WITH PROPOFOL HOT HEMOSTASIS (ARGON PLASMA COAGULATION/BICAP) HEMOSTASIS CLIP PLACEMENT POLYPECTOMY INTESTINAL  Patient Location: PACU  Anesthesia Type:General  Level of Consciousness: awake  Airway & Oxygen Therapy: Patient Spontanous Breathing  Post-op Assessment: Report given to RN and Post -op Vital signs reviewed and stable  Post vital signs: Reviewed and stable  Last Vitals:  Vitals Value Taken Time  BP 98/62 12/29/22 1546  Temp    Pulse 71 12/29/22 1548  Resp 17 12/29/22 1548  SpO2 98 % 12/29/22 1548  Vitals shown include unfiled device data.  Last Pain:  Vitals:   12/29/22 1501  TempSrc:   PainSc: 0-No pain         Complications: No notable events documented.

## 2022-12-29 NOTE — Op Note (Signed)
[facility] Patient Name: Sheryl Bender Procedure Date: 12/29/2022 3:26 PM MRN: [id number] Date of Birth: 29-Aug-1945 Attending MD: Gennette Pac , MD, [id number] CSN: [id number] Age: 77 Admit Type: Inpatient Procedure:                Colonoscopy Indications:              Hematochezia Providers:                Gennette Pac, MD, Crystal Page, Dyann Ruddle Referring MD:              Medicines:                Propofol per Anesthesia Complications:            No immediate complications. Estimated Blood Loss:     Estimated blood loss was minimal. Procedure:                Pre-Anesthesia Assessment:                           - Prior to the procedure, a History and Physical                            was performed, and patient medications and                            allergies were reviewed. The patient's tolerance of                            previous anesthesia was also reviewed. The risks                            and benefits of the procedure and the sedation                            options and risks were discussed with the patient.                            All questions were answered, and informed consent                            was obtained. Prior Anticoagulants: The patient                            last took Eliquis (apixaban) 2 days prior to the                            procedure. ASA Grade Assessment: III - A patient                            with severe systemic disease. After reviewing the                            risks and benefits, the patient was deemed in  satisfactory condition to undergo the procedure.                           After obtaining informed consent, the colonoscope                            was passed under direct vision. Throughout the                            procedure, the patient's blood pressure, pulse, and                            oxygen saturations were monitored continuously. The                             [id number]) scope was introduced through the                            anus and advanced to the the cecum, identified by                            appendiceal orifice and ileocecal valve. Scope In: 3:30:00 PM Scope Out: 3:42:40 PM Scope Withdrawal Time: 0 hours 7 minutes 49 seconds  Total Procedure Duration: 0 hours 12 minutes 40 seconds  Findings:      The perianal and digital rectal examinations were normal. Minimal amount       of old blood in the cecum. No actively bleeding lesions seen anywhere in       the lower GI tract.      A 6 mm polyp was found in the mid sigmoid colon. The polyp was       semi-pedunculated. The polyp was removed with a cold snare. Resection       and retrieval were complete. Estimated blood loss was minimal.      The exam was otherwise without abnormality on direct and retroflexion       views. Impression:               - One 6 mm polyp in the mid sigmoid colon, removed                            with a cold snare. Resected and retrieved.                           - The examination was otherwise normal on direct                            and retroflexion views. Moderate Sedation:      Moderate (conscious) sedation was personally administered by an       anesthesia professional. The following parameters were monitored: oxygen       saturation, heart rate, blood pressure, respiratory rate, EKG, adequacy       of pulmonary ventilation, and response to care. Recommendation:           - Advance diet as tolerated.                           -  Continue present medications. Patient has bled                            from her stomach.                           - Return patient to hospital ward for ongoing care.                            Follow-up on pathology. See EGD report. Clear                            liquid diet. Procedure Code(s):        --- Professional ---                           [phone], Colonoscopy, flexible; with  removal of                            tumor(s), polyp(s), or other lesion(s) by snare                            technique Diagnosis Code(s):        --- Professional ---                           D12.5, Benign neoplasm of sigmoid colon                           K92.1, Melena (includes Hematochezia) CPT copyright 2022 [employer] Association. All rights reserved. The codes documented in this report are preliminary and upon coder review may  be revised to meet current compliance requirements. Gerrit Friends. Lynden Carrithers, MD Gennette Pac, MD 12/29/2022 4:06:36 PM This report has been signed electronically. Number of Addenda: 0

## 2022-12-29 NOTE — Interval H&P Note (Signed)
History and Physical Interval Note:  12/29/2022 2:49 PM  Sheryl Bender  has presented today for surgery, with the diagnosis of acute on chronic anemia, maroon colored stool.  The various methods of treatment have been discussed with the patient and family. After consideration of risks, benefits and other options for treatment, the patient has consented to  Procedure(s): COLONOSCOPY WITH PROPOFOL (N/A) ESOPHAGOGASTRODUODENOSCOPY (EGD) WITH PROPOFOL (N/A) as a surgical intervention.  The patient's history has been reviewed, patient examined, no change in status, stable for surgery.  I have reviewed the patient's chart and labs.  Questions were answered to the patient's satisfaction.     Eula Listen   patient seen and examined in short stay.  Hemodynamically stable hemoglobin 8.8 this morning after 2 units transfused.  Eliquis has been held.  Given clinical scenario, agree with need for diagnostic EGD and colonoscopy.    Denies dysphagia.  Of offer the patient EGD and colonoscopy per plan. The risks, benefits, limitations, imponderables and alternatives regarding both EGD and colonoscopy have been reviewed with the patient. Questions have been answered. All parties agreeable.

## 2022-12-29 NOTE — Anesthesia Preprocedure Evaluation (Signed)
Anesthesia Evaluation  Patient identified by MRN, date of birth, ID band Patient awake    Reviewed: Allergy & Precautions, NPO status , Patient's Chart, lab work & pertinent test results, reviewed documented beta blocker date and time   Airway Mallampati: II  TM Distance: >3 FB Neck ROM: Full    Dental no notable dental hx. (+) Teeth Intact, Dental Advisory Given   Pulmonary PE (9/22) History PE   Pulmonary exam normal breath sounds clear to auscultation       Cardiovascular hypertension, Pt. on home beta blockers and Pt. on medications Normal cardiovascular exam+ dysrhythmias  Rhythm:Regular Rate:Normal     Neuro/Psych  PSYCHIATRIC DISORDERS Anxiety Depression    negative neurological ROS     GI/Hepatic ,GERD  Medicated,,Pancreatic pseudocyst Pancreatic adenocarcinoma  Indwelling pancreatic stent   Endo/Other  negative endocrine ROS    Renal/GU negative Renal ROS     Musculoskeletal  (+) Arthritis , Osteoarthritis,    Abdominal   Peds  Hematology  (+) Blood dyscrasia, anemia   Anesthesia Other Findings Pancreatic adenocarcinoma  Reproductive/Obstetrics                             Anesthesia Physical Anesthesia Plan  ASA: 4  Anesthesia Plan: MAC   Post-op Pain Management: Minimal or no pain anticipated   Induction: Intravenous  PONV Risk Score and Plan: 2 and Treatment may vary due to age or medical condition and Propofol infusion  Airway Management Planned: Natural Airway, Simple Face Mask and Nasal Cannula  Additional Equipment: None  Intra-op Plan:   Post-operative Plan:   Informed Consent: I have reviewed the patients History and Physical, chart, labs and discussed the procedure including the risks, benefits and alternatives for the proposed anesthesia with the patient or authorized representative who has indicated his/her understanding and acceptance.     Dental  advisory given  Plan Discussed with: CRNA and Anesthesiologist  Anesthesia Plan Comments:         Anesthesia Quick Evaluation

## 2022-12-30 ENCOUNTER — Telehealth: Payer: Self-pay | Admitting: Gastroenterology

## 2022-12-30 ENCOUNTER — Other Ambulatory Visit: Payer: Self-pay | Admitting: Gastroenterology

## 2022-12-30 DIAGNOSIS — D649 Anemia, unspecified: Secondary | ICD-10-CM | POA: Diagnosis not present

## 2022-12-30 LAB — GLUCOSE, CAPILLARY: Glucose-Capillary: 84 mg/dL (ref 70–99)

## 2022-12-30 LAB — HEMOGLOBIN AND HEMATOCRIT, BLOOD
HCT: 30.2 % — ABNORMAL LOW (ref 36.0–46.0)
Hemoglobin: 9.4 g/dL — ABNORMAL LOW (ref 12.0–15.0)

## 2022-12-30 MED ORDER — SUCRALFATE 1 GM/10ML PO SUSP
1.0000 g | Freq: Three times a day (TID) | ORAL | Status: DC
Start: 2022-12-30 — End: 2022-12-31
  Administered 2022-12-30 – 2022-12-31 (×3): 1 g via ORAL
  Filled 2022-12-30 (×3): qty 10

## 2022-12-30 MED ORDER — SUCRALFATE 1 GM/10ML PO SUSP: 1.0000 g | Freq: Four times a day (QID) | ORAL | 0 refills | Status: DC

## 2022-12-30 MED ORDER — APIXABAN 5 MG PO TABS: 5.0000 mg | ORAL_TABLET | Freq: Two times a day (BID) | ORAL | Status: DC

## 2022-12-30 NOTE — Telephone Encounter (Signed)
Please arrange for hospital follow up with Belenda Cruise in 3-4 weeks.

## 2022-12-30 NOTE — Care Management (Signed)
Patient is having small frequency, darker stools.  She is very nervous about further bleeding and going home.  Patient lives alone.  Complained of mild abdominal distention.  Plan: Discontinue discharge orders.  Monitor in the hospital.  Check hemoglobin.  Slowly advance diet.  Transfuse if hemoglobin less than 7.  Anticipate home tomorrow.

## 2022-12-30 NOTE — Plan of Care (Signed)

## 2022-12-30 NOTE — Discharge Summary (Signed)
Physician Discharge Summary  Fiera Pralle ZOX:[id number] DOB: 1945-08-31 DOA: 12/27/2022  PCP: [name], DO  Admit date: 12/27/2022 Discharge date: 12/30/2022  Admitted From: Home Disposition: Home  Recommendations for Outpatient Follow-up:  Follow up with PCP in 1-2 weeks Please obtain BMP/CBC in one week Follow-up with oncology as scheduled  Home Health: N/A Equipment/Devices: N/A  Discharge Condition: Stable CODE STATUS: Full code Diet recommendation: Soft diet.  Discharge summary: 77 year old with history of stage I pancreatic cancer status post chemoradiation currently on surveillance, pulmonary embolism since 2022 currently on Eliquis, chronic iron-deficiency anemia, hypertension , anxiety admitted with symptomatic anemia. Hemoglobin 7.1. Recent transfusion as outpatient. She was given 2 units of PRBC transfusion with appropriate response.  Patient underwent EGD and colonoscopy with findings below.  Acute on chronic upper GI bleeding.  Acute on chronic iron deficiency anemia. Baseline hemoglobin 9-10. Hemoglobin 7.1-2 units of PRBC-8.8.  Patient did not have evidence of ongoing bleeding.  Treated with Protonix.  Underwent EGD and colonoscopy after bowel prep.  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   Colonoscopy with no active bleeding, minimal amount of old blood in the cecum, 6 mm polyp removed from mid sigmoid colon.   Stabilized.  Tolerating soft diet. As per GI recommendations, she is going home with continuing oral Prilosec 40 mg daily, sucralfate 1 g 4 times daily for 5 days.  She will continue to follow with oncology clinic, may benefit with ongoing iron replacements.  She will need repeat hemoglobin in 1 week.   Pulmonary embolism on Eliquis: Last episode of PE was in 09/2020.  Eliquis is held.   Due to evidence of active bleeding from the antral lesions, GI recommended to hold Eliquis for 1  week.  Will advise to start on 12/11.   Patient does have thromboembolism related to pancreatic cancer, she is high risk of recurrence without anticoagulation.   Chronic medical issues including Pancreatic adenocarcinoma, under surveillance.  Followed by oncology. Essential hypertension, on metoprolol Anxiety and depression, on Wellbutrin Celexa BuSpar and Remeron.  Stable to discharge home with outpatient follow-up.   Discharge Diagnoses:  Principal Problem:   Symptomatic anemia Active Problems:   GERD (gastroesophageal reflux disease)   Depression   Essential hypertension   Malignant neoplasm of pancreas [facility])    Discharge Instructions  Discharge Instructions     Diet general   Complete by: As directed    Soft  and liquid diet   Discharge instructions   Complete by: As directed    Start taking Eliquis only on 12/11   Increase activity slowly   Complete by: As directed       Allergies as of 12/30/2022       Reactions   Other Hives, Other (See Comments)   Cherry wood just cut- "smelled it and broke out"; cannot tolerate ANY cherry fragrances, either   Cherry Hives   Wound Dressing Adhesive Rash, Other (See Comments)   Band-Aids = local reaction        Medication List     TAKE these medications    acetaminophen 500 MG tablet Commonly known as: TYLENOL Take 2 tablets (1,000 mg total) by mouth every 8 (eight) hours as needed for moderate pain, headache or fever. What changed: how much to take   apixaban 5 MG Tabs tablet Commonly known as: ELIQUIS Take 1 tablet (5 mg total) by mouth 2 (two) times daily. Resume taking on 12/11 What  changed: additional instructions   buPROPion 300 MG 24 hr tablet Commonly known as: WELLBUTRIN XL Take 300 mg by mouth daily after breakfast. Takes differently from prescribed: 20mg  BID   busPIRone 10 MG tablet Commonly known as: BUSPAR Take 20 mg by mouth 3 (three) times daily.   calcium carbonate 500 MG chewable  tablet Commonly known as: TUMS - dosed in mg elemental calcium Chew 1 tablet (200 mg of elemental calcium total) by mouth 2 (two) times daily as needed for indigestion or heartburn. What changed: how much to take   escitalopram 20 MG tablet Commonly known as: LEXAPRO Take 20 mg by mouth at bedtime.   lactose free nutrition Liqd Take 237 mLs by mouth 2 (two) times daily between meals.   lidocaine-prilocaine cream Commonly known as: EMLA Apply a small amount to port a cath site (do not rub in) and cover with plastic wrap 1 hour prior to infusion appointments   Linzess 72 MCG capsule Generic drug: linaclotide TAKE 1 CAPSULE BY MOUTH DAILY BEFORE BREAKFAST. What changed: See the new instructions.   metoprolol succinate 25 MG 24 hr tablet Commonly known as: TOPROL-XL Take 0.5 tablets (12.5 mg total) by mouth daily after breakfast.   mirtazapine 15 MG tablet Commonly known as: REMERON Take 15 mg by mouth at bedtime. What changed: Another medication with the same name was removed. Continue taking this medication, and follow the directions you see here.   omeprazole 40 MG capsule Commonly known as: PRILOSEC TAKE 1 CAPSULE (40 MG TOTAL) BY MOUTH DAILY.   QUEtiapine 25 MG tablet Commonly known as: SEROQUEL Take 25 mg by mouth 2 (two) times daily.   sucralfate 1 GM/10ML suspension Commonly known as: Carafate Take 10 mLs (1 g total) by mouth 4 (four) times daily for 5 days.   SYSTANE OP Place 1 drop into both eyes at bedtime.   Vicks VapoInhaler Inha Inhale 1 puff into the lungs daily as needed (congestion).   Vitamin D3 125 MCG (5000 UT) Caps Take 5,000 Units by mouth daily.        Allergies  Allergen Reactions   Other Hives and Other (See Comments)    Cherry wood just cut- "smelled it and broke out"; cannot tolerate ANY cherry fragrances, either      Cherry Hives   Wound Dressing Adhesive Rash and Other (See Comments)    Band-Aids = local reaction     Consultations: Gastroenterology   Procedures/Studies: No results found. (Echo, Carotid, EGD, Colonoscopy, ERCP)    Subjective: Seen in the morning rounds.  She has some bloating sensation.  Tolerating liquid.  Passing gas.  No bowel movement after procedure.   Discharge Exam: Vitals:   12/30/22 0536 12/30/22 0834  BP: (!) 111/50 132/68  Pulse: 69 71  Resp: 16 18  Temp: 98.3 F (36.8 C)   SpO2: 95% 98%   Vitals:   12/29/22 1600 12/29/22 2037 12/30/22 0536 12/30/22 0834  BP: 123/67 131/70 (!) 111/50 132/68  Pulse: 71 70 69 71  Resp: 13 16 16 18   Temp: 97.7 F (36.5 C) 97.9 F (36.6 C) 98.3 F (36.8 C)   TempSrc:  Oral Oral   SpO2: 99% 98% 95% 98%  Weight:      Height:        General: Pt is alert, awake, not in acute distress, slightly anxious. Cardiovascular: RRR, S1/S2 +, no rubs, no gallops, Port-A-Cath right chest wall. Respiratory: CTA bilaterally, no wheezing, no rhonchi Abdominal: Soft, NT, ND, bowel  sounds + Extremities: no edema, no cyanosis    The results of significant diagnostics from this hospitalization (including imaging, microbiology, ancillary and laboratory) are listed below for reference.     Microbiology: No results found for this or any previous visit (from the past 240 hour(s)).   Labs: BNP (last 3 results) No results for input(s): "BNP" in the last 8760 hours. Basic Metabolic Panel: Recent Labs  Lab 12/27/22 1212 12/27/22 1935 12/29/22 0423  NA 139 139 139  K 4.2 3.9 3.6  CL 109 106 109  CO2 24  --  24  GLUCOSE 97 132* 84  BUN 33* 28* 17  CREATININE 0.70 0.70 0.71  CALCIUM 8.8*  --  8.5*  MG  --   --  2.0   Liver Function Tests: Recent Labs  Lab 12/27/22 1212 12/29/22 0423  AST 24 27  ALT 23 24  ALKPHOS 90 83  BILITOT 0.3 0.5  PROT 5.6* 4.8*  ALBUMIN 3.1* 2.6*   No results for input(s): "LIPASE", "AMYLASE" in the last 168 hours. No results for input(s): "AMMONIA" in the last 168 hours. CBC: Recent Labs   Lab 12/27/22 1212 12/27/22 1935 12/28/22 0425 12/28/22 1011 12/29/22 0423  WBC 3.8*  --  3.3* 3.8* 3.6*  NEUTROABS 2.7  --   --   --   --   HGB 7.1* 7.1* 7.0* 7.7* 8.8*  HCT 23.8* 21.0* 22.2* 24.7* 26.7*  MCV 101.3*  --  95.7 95.4 94.0  PLT 217  --  177 170 161   Cardiac Enzymes: No results for input(s): "CKTOTAL", "CKMB", "CKMBINDEX", "TROPONINI" in the last 168 hours. BNP: Invalid input(s): "POCBNP" CBG: Recent Labs  Lab 12/28/22 0749 12/29/22 0739 12/30/22 0830  GLUCAP 79 95 84   D-Dimer No results for input(s): "DDIMER" in the last 72 hours. Hgb A1c No results for input(s): "HGBA1C" in the last 72 hours. Lipid Profile No results for input(s): "CHOL", "HDL", "LDLCALC", "TRIG", "CHOLHDL", "LDLDIRECT" in the last 72 hours. Thyroid function studies No results for input(s): "TSH", "T4TOTAL", "T3FREE", "THYROIDAB" in the last 72 hours.  Invalid input(s): "FREET3" Anemia work up Recent Labs    12/28/22 0425 12/28/22 1011  VITAMINB12 352  --   FOLATE 14.6  --   FERRITIN 11  --   TIBC 271  --   IRON 50  --   RETICCTPCT  --  3.4*   Urinalysis    Component Value Date/Time   COLORURINE YELLOW 05/06/2022 1225   APPEARANCEUR CLEAR 05/06/2022 1225   LABSPEC 1.017 05/06/2022 1225   PHURINE 5.0 05/06/2022 1225   GLUCOSEU NEGATIVE 05/06/2022 1225   HGBUR NEGATIVE 05/06/2022 1225   BILIRUBINUR NEGATIVE 05/06/2022 1225   KETONESUR NEGATIVE 05/06/2022 1225   PROTEINUR NEGATIVE 05/06/2022 1225   NITRITE NEGATIVE 05/06/2022 1225   LEUKOCYTESUR NEGATIVE 05/06/2022 1225   Sepsis Labs Recent Labs  Lab 12/27/22 1212 12/28/22 0425 12/28/22 1011 12/29/22 0423  WBC 3.8* 3.3* 3.8* 3.6*   Microbiology No results found for this or any previous visit (from the past 240 hour(s)).   Time coordinating discharge: 35 minutes  SIGNED:   Dorcas Carrow, MD  [facility] 12/30/2022, 9:16 AM

## 2022-12-30 NOTE — Progress Notes (Signed)
Gastroenterology Progress Note   Referring Provider: No ref. provider found Primary Care Physician:  [name], DO Primary Gastroenterologist:  Roetta Sessions, MD Patient ID: Sheryl Bender; [id number]; 1945-09-16   Subjective:    Going home today. She wonders about going to assisted living, "but it's complicated". Overall abdominal pain stable. No N/V. No BM since bowel prep.   Objective:   Vital signs in last 24 hours: Temp:  [97.7 F (36.5 C)-98.4 F (36.9 C)] 98.3 F (36.8 C) (12/05 0536) Pulse Rate:  [69-76] 71 (12/05 0834) Resp:  [13-22] 18 (12/05 0834) BP: (98-132)/(50-70) 132/68 (12/05 0834) SpO2:  [95 %-99 %] 98 % (12/05 0834) Last BM Date : 12/29/22 General:   Alert,  Well-developed, well-nourished, pleasant and cooperative in NAD Head:  Normocephalic and atraumatic. Eyes:  Sclera clear, no icterus.   Abdomen:  Soft,  nondistended. Mild epigastric tenderness. Normal bowel sounds, without guarding, and without rebound.   Extremities:  Without clubbing, deformity or edema. Neurologic:  Alert and  oriented x4;  grossly normal neurologically. Skin:  Intact without significant lesions or rashes. Psych:  Alert and cooperative. Normal mood and affect.  Intake/Output from previous day: 12/04 0701 - 12/05 0700 In: 880 [P.O.:480; I.V.:400] Out: -  Intake/Output this shift: No intake/output data recorded.  Lab Results: CBC Recent Labs    12/28/22 0425 12/28/22 1011 12/29/22 0423  WBC 3.3* 3.8* 3.6*  HGB 7.0* 7.7* 8.8*  HCT 22.2* 24.7* 26.7*  MCV 95.7 95.4 94.0  PLT 177 170 161   BMET Recent Labs    12/27/22 1212 12/27/22 1935 12/29/22 0423  NA 139 139 139  K 4.2 3.9 3.6  CL 109 106 109  CO2 24  --  24  GLUCOSE 97 132* 84  BUN 33* 28* 17  CREATININE 0.70 0.70 0.71  CALCIUM 8.8*  --  8.5*   LFTs Recent Labs    12/27/22 1212 12/29/22 0423  BILITOT 0.3 0.5  ALKPHOS 90 83  AST 24 27  ALT 23 24  PROT 5.6* 4.8*  ALBUMIN 3.1* 2.6*   No  results for input(s): "LIPASE" in the last 72 hours. PT/INR Recent Labs    12/27/22 1923  LABPROT 14.8  INR 1.1         Imaging Studies: No results found.[2 weeks]  Assessment:   77 y.o. year old female with medical history significant of stage I pancreatic cancer previously treated with radiation and chemotherapy and now just continued observation, anxiety, depression, pulmonary embolism diagnosed September 2022 currently on Eliquis, chronic iron deficiency anemia, recent outpatient transfusion of 2 units PRBCs on 11/21 for Hgb 6.2, who presented back to the ER on 12/2 with progressive fatigue and weakness, dyspnea on exertion, maroon colored, admitted for symptomatic anemia with GI bleed.    Symptomatic anemia/maroon colored stool:  Prior baseline hemoglobin in the 10-11 range.  Hemoglobin down to 7.0 on 11/15 which is around the time that she started noticing maroon-colored stool but did not find anyone of this as she thought that it may be related to medications.  Received 2 units PRBCs on 11/21 in the outpatient setting for hemoglobin down to 6.2.  When she presented to the ER yesterday, hemoglobin was 7.1.  Reports her last maroon-colored stool was 2 days ago.  No similar symptoms in the past.   She denies NSAID use though she does take Eliquis with last dose morning of 12/2.  She received 1 unit PRBCs overnight and hemoglobin has improved to 7.7.  Colonoscopy with 6 mm polyp in the mid sigmoid colon removed, minimal amount of old blood in the cecum with no actively bleeding lesions anywhere in the lower GI tract.   EGD showing 2 antral Dielafoy's with active bleeding status post gold probe and clipped. Two other small areas with slight oozing in immediate prepyloric mucosa sealed with gold probe.   Plan:    Carafate 1 g slurry 4 times daily for five days. Continue PPI. If continued anticoagulation felt to be needed, resume no sooner than 01/04/23.  F/u pending path. Return  ov with Ermalinda Memos, PA-C in 3-4 weeks.    LOS: 3 days   Leanna Battles. Dixon Boos [facility] [phone] 12/5/20248:48 AM

## 2022-12-30 NOTE — Care Management Important Message (Signed)
Important Message  Patient Details  Name: Sheryl Bender MRN: [id number] Date of Birth: 1945/06/08   Important Message Given:  N/A - LOS <3 / Initial given by admissions     [name] 12/30/2022, 11:20 AM

## 2022-12-31 DIAGNOSIS — D649 Anemia, unspecified: Secondary | ICD-10-CM | POA: Diagnosis not present

## 2022-12-31 LAB — GLUCOSE, CAPILLARY: Glucose-Capillary: 95 mg/dL (ref 70–99)

## 2022-12-31 LAB — HEMOGLOBIN AND HEMATOCRIT, BLOOD
HCT: 28.7 % — ABNORMAL LOW (ref 36.0–46.0)
Hemoglobin: 8.9 g/dL — ABNORMAL LOW (ref 12.0–15.0)

## 2022-12-31 MED ORDER — HEPARIN SOD (PORK) LOCK FLUSH 100 UNIT/ML IV SOLN
500.0000 [IU] | Freq: Once | INTRAVENOUS | Status: AC
  Administered 2022-12-31: 500 [IU] via INTRAVENOUS
  Filled 2022-12-31: qty 5

## 2022-12-31 NOTE — Plan of Care (Signed)

## 2022-12-31 NOTE — Progress Notes (Signed)
PROGRESS NOTE    Sheryl Bender  WUJ:[id number] DOB: July 20, 1945 DOA: 12/27/2022 PCP: [name], DO    Brief Narrative:  Patient with pancreatic cancer, PE on Eliquis admitted with upper GI bleeding.  Endoscopy consistent with multiple antral erosions treated with gold cautery.  Colonoscopy essentially normal.  Remain in the hospital for monitoring after procedure.  Subjective: Patient seen and examined.  Mild abdominal discomfort present.  Tolerating regular diet.  She had 1 loose bowel movement overnight without overt blood or change in color.  Hemoglobin has remained stable.  Comfortable with plan to go home. Assessment & Plan:   Acute on chronic upper GI bleeding.  Acute on chronic iron deficiency anemia. Baseline hemoglobin 9-10. Hemoglobin 7.1-2 units of PRBC-8.8.  Patient did not have evidence of ongoing bleeding.  Treated with Protonix.  Underwent EGD and colonoscopy after bowel prep.  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   Colonoscopy with no active bleeding, minimal amount of old blood in the cecum, 6 mm polyp removed from mid sigmoid colon.   Stabilized.  Tolerating soft diet. As per GI recommendations, she is going home with continuing oral Prilosec 40 mg daily, sucralfate 1 g 4 times daily for 5 days.  She will continue to follow with oncology clinic, may benefit with ongoing iron replacements.  She will need repeat hemoglobin in 1 week.   Pulmonary embolism on Eliquis: Last episode of PE was in 09/2020.  Eliquis is held.   Due to evidence of active bleeding from the antral lesions, GI recommended to hold Eliquis for 1 week.  Will advise to start on 12/11.   Patient does have thromboembolism related to pancreatic cancer, she is high risk of recurrence without anticoagulation.   Chronic medical issues including Pancreatic adenocarcinoma, under surveillance.  Followed by oncology. Essential hypertension,  on metoprolol Anxiety and depression, on Wellbutrin Celexa BuSpar and Remeron.   Stable to discharge home with outpatient follow-up.     DVT prophylaxis: SCDs Start: 12/27/22 2135   Code Status: Full code Family Communication: None Disposition Plan: Status is: Inpatient Remains inpatient appropriate because: Going home today     Consultants:  GI  Procedures:  EGD and colonoscopy  Antimicrobials:  None     Objective: Vitals:   12/30/22 0536 12/30/22 0834 12/30/22 2138 12/31/22 0440  BP: (!) 111/50 132/68 (!) 119/57 (!) 111/57  Pulse: 69 71 66 60  Resp: 16 18 18 18   Temp: 98.3 F (36.8 C)  97.8 F (36.6 C) 98.6 F (37 C)  TempSrc: Oral  Oral Oral  SpO2: 95% 98% 97% 94%  Weight:    57.9 kg  Height:        Intake/Output Summary (Last 24 hours) at 12/31/2022 0816 Last data filed at 12/30/2022 1816 Gross per 24 hour  Intake 100 ml  Output --  Net 100 ml   Filed Weights   12/27/22 2117 12/29/22 0428 12/31/22 0440  Weight: 63.1 kg 62.4 kg 57.9 kg    Examination:  Looks fairly comfortable.  Abdomen is soft and nontender.  Bowel sounds are present.    Data Reviewed: I have personally reviewed following labs and imaging studies  CBC: Recent Labs  Lab 12/27/22 1212 12/27/22 1935 12/28/22 0425 12/28/22 1011 12/29/22 0423 12/30/22 1413 12/31/22 0418  WBC 3.8*  --  3.3* 3.8* 3.6*  --   --   NEUTROABS 2.7  --   --   --   --   --   --  HGB 7.1*   < > 7.0* 7.7* 8.8* 9.4* 8.9*  HCT 23.8*   < > 22.2* 24.7* 26.7* 30.2* 28.7*  MCV 101.3*  --  95.7 95.4 94.0  --   --   PLT 217  --  177 170 161  --   --    < > = values in this interval not displayed.   Basic Metabolic Panel: Recent Labs  Lab 12/27/22 1212 12/27/22 1935 12/29/22 0423  NA 139 139 139  K 4.2 3.9 3.6  CL 109 106 109  CO2 24  --  24  GLUCOSE 97 132* 84  BUN 33* 28* 17  CREATININE 0.70 0.70 0.71  CALCIUM 8.8*  --  8.5*  MG  --   --  2.0   GFR: Estimated Creatinine Clearance: 45.8  mL/min (by C-G formula based on SCr of 0.71 mg/dL). Liver Function Tests: Recent Labs  Lab 12/27/22 1212 12/29/22 0423  AST 24 27  ALT 23 24  ALKPHOS 90 83  BILITOT 0.3 0.5  PROT 5.6* 4.8*  ALBUMIN 3.1* 2.6*   No results for input(s): "LIPASE", "AMYLASE" in the last 168 hours. No results for input(s): "AMMONIA" in the last 168 hours. Coagulation Profile: Recent Labs  Lab 12/27/22 1923  INR 1.1   Cardiac Enzymes: No results for input(s): "CKTOTAL", "CKMB", "CKMBINDEX", "TROPONINI" in the last 168 hours. BNP (last 3 results) No results for input(s): "PROBNP" in the last 8760 hours. HbA1C: No results for input(s): "HGBA1C" in the last 72 hours. CBG: Recent Labs  Lab 12/28/22 0749 12/29/22 0739 12/30/22 0830 12/31/22 0720  GLUCAP 79 95 84 95   Lipid Profile: No results for input(s): "CHOL", "HDL", "LDLCALC", "TRIG", "CHOLHDL", "LDLDIRECT" in the last 72 hours. Thyroid Function Tests: No results for input(s): "TSH", "T4TOTAL", "FREET4", "T3FREE", "THYROIDAB" in the last 72 hours. Anemia Panel: Recent Labs    12/28/22 1011  RETICCTPCT 3.4*   Sepsis Labs: No results for input(s): "PROCALCITON", "LATICACIDVEN" in the last 168 hours.  No results found for this or any previous visit (from the past 240 hour(s)).       Radiology Studies: No results found.      Scheduled Meds:  sodium chloride   Intravenous Once   buPROPion  300 mg Oral QPC breakfast   busPIRone  20 mg Oral BID   Chlorhexidine Gluconate Cloth  6 each Topical Daily   escitalopram  20 mg Oral QHS   linaclotide  145 mcg Oral QAC breakfast   metoprolol succinate  12.5 mg Oral QPC breakfast   mirtazapine  30 mg Oral QHS   pantoprazole (PROTONIX) IV  40 mg Intravenous Q12H   polyvinyl alcohol  1 drop Both Eyes QHS   QUEtiapine  25 mg Oral BID   sucralfate  1 g Oral TID WC & HS   cyanocobalamin  500 mcg Oral Daily   Continuous Infusions:   LOS: 4 days    Time spent: 28  minutes    Dorcas Carrow, MD [facility]

## 2023-01-03 LAB — SURGICAL PATHOLOGY

## 2023-01-05 ENCOUNTER — Encounter (HOSPITAL_COMMUNITY): Payer: Self-pay | Admitting: Internal Medicine

## 2023-01-11 ENCOUNTER — Telehealth: Payer: Self-pay | Admitting: *Deleted

## 2023-01-11 ENCOUNTER — Other Ambulatory Visit: Payer: Self-pay | Admitting: *Deleted

## 2023-01-11 ENCOUNTER — Inpatient Hospital Stay: Payer: Medicare Other | Attending: Hematology | Admitting: Hematology

## 2023-01-11 DIAGNOSIS — D509 Iron deficiency anemia, unspecified: Secondary | ICD-10-CM | POA: Insufficient documentation

## 2023-01-11 DIAGNOSIS — C250 Malignant neoplasm of head of pancreas: Secondary | ICD-10-CM | POA: Insufficient documentation

## 2023-01-11 DIAGNOSIS — K2961 Other gastritis with bleeding: Secondary | ICD-10-CM | POA: Diagnosis not present

## 2023-01-11 DIAGNOSIS — K2971 Gastritis, unspecified, with bleeding: Secondary | ICD-10-CM | POA: Diagnosis not present

## 2023-01-11 LAB — CBC WITH DIFFERENTIAL/PLATELET
Abs Immature Granulocytes: 0.01 10*3/uL (ref 0.00–0.07)
Basophils Absolute: 0 10*3/uL (ref 0.0–0.1)
Basophils Relative: 0 %
Eosinophils Absolute: 0.1 10*3/uL (ref 0.0–0.5)
Eosinophils Relative: 1 %
HCT: 27.4 % — ABNORMAL LOW (ref 36.0–46.0)
Hemoglobin: 8.8 g/dL — ABNORMAL LOW (ref 12.0–15.0)
Immature Granulocytes: 0 %
Lymphocytes Relative: 31 %
Lymphs Abs: 1.5 10*3/uL (ref 0.7–4.0)
MCH: 31.3 pg (ref 26.0–34.0)
MCHC: 32.1 g/dL (ref 30.0–36.0)
MCV: 97.5 fL (ref 80.0–100.0)
Monocytes Absolute: 0.6 10*3/uL (ref 0.1–1.0)
Monocytes Relative: 12 %
Neutro Abs: 2.6 10*3/uL (ref 1.7–7.7)
Neutrophils Relative %: 56 %
Platelets: 243 10*3/uL (ref 150–400)
RBC: 2.81 MIL/uL — ABNORMAL LOW (ref 3.87–5.11)
RDW: 16.1 % — ABNORMAL HIGH (ref 11.5–15.5)
WBC: 4.7 10*3/uL (ref 4.0–10.5)
nRBC: 0 % (ref 0.0–0.2)

## 2023-01-11 LAB — SAMPLE TO BLOOD BANK

## 2023-01-11 NOTE — Telephone Encounter (Signed)
Patient called to advise that she is continuing to have some rectal bleeding and is experiencing weakness and sob.  Recently had colonoscopy by Dr. Jena Gauss.  Will bring her in today for CBCD and BB Sample.  Notified Dr. Jena Gauss that I advised patient to call and follow up with him, as well.  Patient verbalized understanding.

## 2023-01-11 NOTE — Progress Notes (Signed)
Patient notified that no blood products are needed at this time.  Has follow up with Dr. Jena Gauss on Thursday.

## 2023-01-12 ENCOUNTER — Encounter: Payer: Self-pay | Admitting: Hematology

## 2023-01-12 ENCOUNTER — Emergency Department (HOSPITAL_COMMUNITY): Payer: Medicare Other

## 2023-01-12 ENCOUNTER — Other Ambulatory Visit: Payer: Self-pay

## 2023-01-12 ENCOUNTER — Inpatient Hospital Stay (HOSPITAL_COMMUNITY)
Admission: EM | Admit: 2023-01-12 | Discharge: 2023-01-15 | DRG: 378 | Disposition: A | Discharge status: Home Health | Payer: Medicare Other | Attending: Internal Medicine | Admitting: Internal Medicine

## 2023-01-12 ENCOUNTER — Encounter: Payer: Self-pay | Admitting: Internal Medicine

## 2023-01-12 ENCOUNTER — Telehealth: Payer: Self-pay | Admitting: *Deleted

## 2023-01-12 ENCOUNTER — Encounter (HOSPITAL_COMMUNITY): Payer: Self-pay | Admitting: Emergency Medicine

## 2023-01-12 DIAGNOSIS — Z7901 Long term (current) use of anticoagulants: Secondary | ICD-10-CM

## 2023-01-12 DIAGNOSIS — D649 Anemia, unspecified: Secondary | ICD-10-CM | POA: Diagnosis not present

## 2023-01-12 DIAGNOSIS — D509 Iron deficiency anemia, unspecified: Secondary | ICD-10-CM | POA: Diagnosis present

## 2023-01-12 DIAGNOSIS — K922 Gastrointestinal hemorrhage, unspecified: Secondary | ICD-10-CM | POA: Diagnosis not present

## 2023-01-12 DIAGNOSIS — Z8719 Personal history of other diseases of the digestive system: Secondary | ICD-10-CM | POA: Diagnosis not present

## 2023-01-12 DIAGNOSIS — Z860101 Personal history of adenomatous and serrated colon polyps: Secondary | ICD-10-CM

## 2023-01-12 DIAGNOSIS — Z8507 Personal history of malignant neoplasm of pancreas: Secondary | ICD-10-CM

## 2023-01-12 DIAGNOSIS — Z860100 Personal history of colon polyps, unspecified: Secondary | ICD-10-CM

## 2023-01-12 DIAGNOSIS — Z9221 Personal history of antineoplastic chemotherapy: Secondary | ICD-10-CM

## 2023-01-12 DIAGNOSIS — Z79899 Other long term (current) drug therapy: Secondary | ICD-10-CM

## 2023-01-12 DIAGNOSIS — Z91048 Other nonmedicinal substance allergy status: Secondary | ICD-10-CM

## 2023-01-12 DIAGNOSIS — F419 Anxiety disorder, unspecified: Secondary | ICD-10-CM | POA: Diagnosis present

## 2023-01-12 DIAGNOSIS — K921 Melena: Secondary | ICD-10-CM

## 2023-01-12 DIAGNOSIS — T66XXXA Radiation sickness, unspecified, initial encounter: Secondary | ICD-10-CM | POA: Diagnosis present

## 2023-01-12 DIAGNOSIS — K2961 Other gastritis with bleeding: Principal | ICD-10-CM | POA: Diagnosis present

## 2023-01-12 DIAGNOSIS — K31819 Angiodysplasia of stomach and duodenum without bleeding: Secondary | ICD-10-CM

## 2023-01-12 DIAGNOSIS — K31811 Angiodysplasia of stomach and duodenum with bleeding: Secondary | ICD-10-CM | POA: Diagnosis present

## 2023-01-12 DIAGNOSIS — Z981 Arthrodesis status: Secondary | ICD-10-CM

## 2023-01-12 DIAGNOSIS — F32A Depression, unspecified: Secondary | ICD-10-CM | POA: Diagnosis present

## 2023-01-12 DIAGNOSIS — Z86711 Personal history of pulmonary embolism: Secondary | ICD-10-CM

## 2023-01-12 DIAGNOSIS — Z91018 Allergy to other foods: Secondary | ICD-10-CM

## 2023-01-12 DIAGNOSIS — I10 Essential (primary) hypertension: Secondary | ICD-10-CM | POA: Diagnosis present

## 2023-01-12 DIAGNOSIS — Z95828 Presence of other vascular implants and grafts: Secondary | ICD-10-CM

## 2023-01-12 DIAGNOSIS — Y842 Radiological procedure and radiotherapy as the cause of abnormal reaction of the patient, or of later complication, without mention of misadventure at the time of the procedure: Secondary | ICD-10-CM | POA: Diagnosis present

## 2023-01-12 DIAGNOSIS — D62 Acute posthemorrhagic anemia: Secondary | ICD-10-CM | POA: Diagnosis present

## 2023-01-12 DIAGNOSIS — K219 Gastro-esophageal reflux disease without esophagitis: Secondary | ICD-10-CM | POA: Diagnosis present

## 2023-01-12 LAB — CBC WITH DIFFERENTIAL/PLATELET
Abs Immature Granulocytes: 0.01 10*3/uL (ref 0.00–0.07)
Basophils Absolute: 0 10*3/uL (ref 0.0–0.1)
Basophils Relative: 0 %
Eosinophils Absolute: 0 10*3/uL (ref 0.0–0.5)
Eosinophils Relative: 1 %
HCT: 25.8 % — ABNORMAL LOW (ref 36.0–46.0)
Hemoglobin: 8.3 g/dL — ABNORMAL LOW (ref 12.0–15.0)
Immature Granulocytes: 0 %
Lymphocytes Relative: 17 %
Lymphs Abs: 0.7 10*3/uL (ref 0.7–4.0)
MCH: 31 pg (ref 26.0–34.0)
MCHC: 32.2 g/dL (ref 30.0–36.0)
MCV: 96.3 fL (ref 80.0–100.0)
Monocytes Absolute: 0.5 10*3/uL (ref 0.1–1.0)
Monocytes Relative: 11 %
Neutro Abs: 2.9 10*3/uL (ref 1.7–7.7)
Neutrophils Relative %: 71 %
Platelets: 190 10*3/uL (ref 150–400)
RBC: 2.68 MIL/uL — ABNORMAL LOW (ref 3.87–5.11)
RDW: 16 % — ABNORMAL HIGH (ref 11.5–15.5)
WBC: 4.1 10*3/uL (ref 4.0–10.5)
nRBC: 0 % (ref 0.0–0.2)

## 2023-01-12 LAB — COMPREHENSIVE METABOLIC PANEL
ALT: 22 U/L (ref 0–44)
AST: 25 U/L (ref 15–41)
Albumin: 3.2 g/dL — ABNORMAL LOW (ref 3.5–5.0)
Alkaline Phosphatase: 102 U/L (ref 38–126)
Anion gap: 7 (ref 5–15)
BUN: 33 mg/dL — ABNORMAL HIGH (ref 8–23)
CO2: 24 mmol/L (ref 22–32)
Calcium: 8.8 mg/dL — ABNORMAL LOW (ref 8.9–10.3)
Chloride: 104 mmol/L (ref 98–111)
Creatinine, Ser: 0.86 mg/dL (ref 0.44–1.00)
GFR, Estimated: >60 mL/min (ref >60)
Glucose, Bld: 117 mg/dL — ABNORMAL HIGH (ref 70–99)
Potassium: 4.2 mmol/L (ref 3.5–5.1)
Sodium: 135 mmol/L (ref 135–145)
Total Bilirubin: 0.3 mg/dL (ref <1.2)
Total Protein: 5.9 g/dL — ABNORMAL LOW (ref 6.5–8.1)

## 2023-01-12 LAB — POC OCCULT BLOOD, ED: Fecal Occult Blood: POSITIVE — AB

## 2023-01-12 MED ORDER — SODIUM CHLORIDE 0.9 % IV SOLN: INTRAVENOUS | Status: AC

## 2023-01-12 MED ORDER — BUPROPION HCL ER (XL) 300 MG PO TB24
300.0000 mg | ORAL_TABLET | Freq: Every day | ORAL | Status: DC
  Administered 2023-01-14 – 2023-01-15 (×2): 300 mg via ORAL
  Filled 2023-01-12 (×2): qty 1

## 2023-01-12 MED ORDER — MIRTAZAPINE 15 MG PO TABS
15.0000 mg | ORAL_TABLET | Freq: Every day | ORAL | Status: DC
  Administered 2023-01-12 – 2023-01-14 (×3): 15 mg via ORAL
  Filled 2023-01-12 (×3): qty 1

## 2023-01-12 MED ORDER — ONDANSETRON HCL 4 MG/2ML IJ SOLN
4.0000 mg | Freq: Four times a day (QID) | INTRAMUSCULAR | Status: DC | PRN
  Administered 2023-01-14 – 2023-01-15 (×2): 4 mg via INTRAVENOUS
  Filled 2023-01-12 (×2): qty 2

## 2023-01-12 MED ORDER — PANTOPRAZOLE SODIUM 40 MG IV SOLR
40.0000 mg | INTRAVENOUS | Status: AC
Start: 2023-01-12 — End: 2023-01-12
  Administered 2023-01-12 (×2): 40 mg via INTRAVENOUS
  Filled 2023-01-12: qty 10

## 2023-01-12 MED ORDER — METOPROLOL SUCCINATE ER 25 MG PO TB24: 12.5000 mg | ORAL_TABLET | Freq: Every day | ORAL | Status: DC

## 2023-01-12 MED ORDER — BUSPIRONE HCL 5 MG PO TABS
20.0000 mg | ORAL_TABLET | Freq: Three times a day (TID) | ORAL | Status: DC
  Administered 2023-01-12 – 2023-01-15 (×6): 20 mg via ORAL
  Filled 2023-01-12 (×8): qty 1

## 2023-01-12 MED ORDER — ESCITALOPRAM OXALATE 10 MG PO TABS
20.0000 mg | ORAL_TABLET | Freq: Every day | ORAL | Status: DC
  Administered 2023-01-12 – 2023-01-14 (×3): 20 mg via ORAL
  Filled 2023-01-12 (×3): qty 2

## 2023-01-12 MED ORDER — SUCRALFATE 1 GM/10ML PO SUSP
1.0000 g | Freq: Three times a day (TID) | ORAL | Status: DC
  Administered 2023-01-12 – 2023-01-15 (×9): 1 g via ORAL
  Filled 2023-01-12 (×10): qty 10

## 2023-01-12 MED ORDER — IOHEXOL 350 MG/ML SOLN
75.0000 mL | Freq: Once | INTRAVENOUS | Status: AC | PRN
  Administered 2023-01-12: 75 mL via INTRAVENOUS

## 2023-01-12 MED ORDER — PANTOPRAZOLE SODIUM 40 MG IV SOLR
40.0000 mg | Freq: Two times a day (BID) | INTRAVENOUS | Status: DC
  Administered 2023-01-13 – 2023-01-15 (×6): 40 mg via INTRAVENOUS
  Filled 2023-01-12 (×6): qty 10

## 2023-01-12 NOTE — ED Triage Notes (Signed)
Pt just dc for blood in stool a couple weeks ago. Was having normal bm's starting dec 10. States bowels are now maroon color again x 2 since last night. A/o. Ambulatory with steady gait. Pt c/o dizziness starting this am. Color pale in triage. Gen weakness per pt as well. Pt c/o upper abd pain and nausea. Denies vomiting.

## 2023-01-12 NOTE — ED Provider Notes (Signed)
[place] EMERGENCY DEPARTMENT AT [facility] Provider Note   CSN: [id number] Arrival date & time: 01/12/23  1136     History {Add pertinent medical, surgical, social history, OB history to HPI:1} Chief Complaint  Patient presents with   Abdominal Pain   Dizziness   Blood In Stools    Sheryl Bender is a 77 y.o. female.  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  Patient reports that she was discharged home from a GI bleed and have her Eliquis paused for several days.  Started taking it again recently and 2 days ago started having maroon-colored stool.  Is having some gnawing epigastric abdominal pain.  Does take her Eliquis and thinks that her last dose was yesterday in the morning but did not take it last night or this morning.  Is on surveillance for her pancreatic cancer.  Says that she is also feeling lightheaded and short of breath at this time so is concerned that she is anemic.       Home Medications Prior to Admission medications   Medication Sig Start Date End Date Taking? Authorizing Provider  acetaminophen (TYLENOL) 500 MG tablet Take 2 tablets (1,000 mg total) by mouth every 8 (eight) hours as needed for moderate pain, headache or fever. Patient taking differently: Take 750-1,000 mg by mouth every 8 (eight) hours as needed for moderate pain (pain score 4-6), headache or fever. 07/21/21   Vassie Loll, MD  apixaban (ELIQUIS) 5 MG TABS tablet Take 1 tablet (5 mg total) by mouth 2 (two) times daily. Resume taking on 12/11 12/30/22 12/25/23  Dorcas Carrow, MD  Aromatic Inhalants (VICKS VAPOINHALER) INHA Inhale 1 puff into the lungs daily as needed (congestion).    [provider]  buPROPion (WELLBUTRIN XL) 300 MG 24 hr  tablet Take 300 mg by mouth daily after breakfast. Takes differently from prescribed: 20mg  BID 06/15/21   [provider]  busPIRone (BUSPAR) 10 MG tablet Take 20 mg by mouth 3 (three) times daily. 01/27/21   [provider]  calcium carbonate (TUMS - DOSED IN MG ELEMENTAL CALCIUM) 500 MG chewable tablet Chew 1 tablet (200 mg of elemental calcium total) by mouth 2 (two) times daily as needed for indigestion or heartburn. Patient taking differently: Chew 1-2 tablets by mouth 2 (two) times daily as needed for indigestion or heartburn. 08/31/21   Lewie Chamber, MD  Cholecalciferol (VITAMIN D3) 125 MCG (5000 UT) CAPS Take 5,000 Units by mouth daily.    [provider]  escitalopram (LEXAPRO) 20 MG tablet Take 20 mg by mouth at bedtime.    [provider]  lactose free nutrition (BOOST) LIQD Take 237 mLs by mouth 2 (two) times daily between meals.    [provider]  lidocaine-prilocaine (EMLA) cream Apply a small amount to port a cath site (do not rub in) and cover with plastic wrap 1 hour prior to infusion appointments 09/15/21   Doreatha Massed, MD  LINZESS 72 MCG capsule TAKE 1 CAPSULE BY MOUTH DAILY BEFORE BREAKFAST. Patient taking differently: Take 72 mcg by mouth daily before breakfast. 12/13/22   Mansouraty, Netty Starring., MD  metoprolol succinate (TOPROL-XL) 25 MG 24 hr tablet Take 0.5 tablets (12.5 mg total) by mouth daily after breakfast. 05/09/22   Darlin Drop, DO  mirtazapine (REMERON) 15 MG tablet Take 15 mg by mouth at bedtime.    [provider]  omeprazole (PRILOSEC) 40 MG capsule TAKE 1 CAPSULE (40 MG TOTAL) BY MOUTH DAILY. 10/19/22   Mansouraty, Netty Starring., MD  Polyethyl Glycol-Propyl Glycol (SYSTANE OP) Place 1 drop into both eyes at bedtime.    [provider]  QUEtiapine (SEROQUEL) 25 MG tablet Take 25 mg by mouth 2 (two) times daily. 12/14/22   [provider]  sucralfate (CARAFATE) 1 GM/10ML suspension Take 10  mLs (1 g total) by mouth 4 (four) times daily for 5 days. 12/30/22 01/04/23  Dorcas Carrow, MD      Allergies    Other, Cherry, and Wound dressing adhesive    Review of Systems   Review of Systems  Physical Exam Updated Vital Signs BP 113/69 (BP Location: Right Arm)   Pulse 76   Temp (!) 97.5 F (36.4 C)   Resp 20   SpO2 96%  Physical Exam Vitals and nursing note reviewed.  Constitutional:      General: She is not in acute distress.    Appearance: She is well-developed.  HENT:     Head: Normocephalic and atraumatic.     Right Ear: External ear normal.     Left Ear: External ear normal.     Nose: Nose normal.  Eyes:     Extraocular Movements: Extraocular movements intact.     Conjunctiva/sclera: Conjunctivae normal.     Pupils: Pupils are equal, round, and reactive to light.  Cardiovascular:     Rate and Rhythm: Normal rate and regular rhythm.     Heart sounds: No murmur heard. Pulmonary:     Effort: Pulmonary effort is normal. No respiratory distress.     Breath sounds: Normal breath sounds.  Abdominal:     General: Abdomen is flat. There is no distension.     Palpations: Abdomen is soft. There is no mass.     Tenderness: There is abdominal tenderness (Epigastric). There is no guarding.  Musculoskeletal:     Cervical back: Normal range of motion and neck supple.     Right lower leg: No edema.     Left lower leg: No edema.  Skin:    General: Skin is warm and dry.  Neurological:     Mental Status: She is alert and oriented to person, place, and time. Mental status is at baseline.  Psychiatric:        Mood and Affect: Mood normal.     ED Results / Procedures / Treatments   Labs (all labs ordered are listed, but only abnormal results are displayed) Labs Reviewed  COMPREHENSIVE METABOLIC PANEL  CBC  POC OCCULT BLOOD, ED  TYPE AND SCREEN    EKG None  Radiology No results found.  Procedures Procedures  {Document cardiac monitor, telemetry assessment  procedure when appropriate:1}  Medications Ordered in ED Medications - No data to display  ED Course/ Medical Decision Making/ A&P   {   Click here for ABCD2, HEART and other calculatorsREFRESH Note before signing :1}                              Medical Decision Making Amount and/or Complexity of Data Reviewed Labs: ordered. Radiology: ordered.   ***  {Document critical care time when appropriate:1} {Document review of labs and clinical decision tools ie heart score, Chads2Vasc2 etc:1}  {Document your independent review of radiology images,  and any outside records:1} {Document your discussion with family members, caretakers, and with consultants:1} {Document social determinants of health affecting pt's care:1} {Document your decision making why or why not admission, treatments were needed:1} Final Clinical Impression(s) / ED Diagnoses Final diagnoses:  None    Rx / DC Orders ED Discharge Orders     None

## 2023-01-12 NOTE — Plan of Care (Signed)
  Problem: Education: Goal: Knowledge of General Education information will improve Description Including pain rating scale, medication(s)/side effects and non-pharmacologic comfort measures Outcome: Progressing   Problem: Health Behavior/Discharge Planning: Goal: Ability to manage health-related needs will improve Outcome: Progressing   

## 2023-01-12 NOTE — H&P (Signed)
History and Physical    Sheryl Bender AYT:[id number] DOB: Dec 14, 1945 DOA: 01/12/2023  PCP: [name], DO   Patient coming from: Home    Chief Complaint: Abdominal pain, dizziness, dark stools  HPI: Sheryl Bender is a 77 y.o. female with medical history significant of pancreatic cancer status post chemoradiation, PE in 2022 on Eliquis, iron-deficiency anemia, upper GI bleed who presented from home with complaint of maroon-colored stool, abdominal discomfort.  She was also having dizziness.  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  Colonoscopy did not show any active bleeding.  She  was discharged to home with plan for continuing Prilosec, Carafate.  Eliquis was initially held but she started taking it on Dec 11. She  started having maroon/dark colored  stool since sunday.  Also had some abdominal discomfort/pain and felt intermittently dizzy. Complained of mild dyspnea.  She called the nurse at the cancer center and she advised her to come to the emergency department. Patient seen and examined at bedside in the emergency department.  During my evaluation, she was hemodynamically stable.  She complains of mild weakness in some nausea but no abdomen pain.  No hematochezia or melena after presentation here.  She denies any chest pain, shortness of breath, palpitations, abdominal pain, vomiting, dysuria.She lives alone and ambulates with the help of cane.   ED Course: On presentation, she was hemodynamically stable with a stable blood pressure.  Lab work showed BUN of 33, normal creatinine, hemoglobin of 8.3.  Hemoglobin was 9.4 on the day of discharge.  FOBT came out to be  positive.  CT angio GI bleed protocol did not show any active bleeding but showed  thickening of the gastric antrum and pylorus, suggestive of gastritis.GI consulted  Review of Systems: As per HPI otherwise 10 point review of systems negative.    Past Medical History:  Diagnosis Date   Anxiety    Arthritis    Depression    Dysrhythmia    hx palpitations greater than 5 yrs -neg echo, stress per pt ? where or dr   GERD (gastroesophageal reflux disease)    occ   Nausea & vomiting 08/24/2021   Pancreatic adenocarcinoma (HCC)    Pancreatic pseudocyst    Port-A-Cath in place 09/15/2021   Pulmonary embolism [facility])    September 2022    Past Surgical History:  Procedure Laterality Date   ANTERIOR LAT LUMBAR FUSION Right 12/16/2015   Procedure: RIGHT LUMBAR TWO-THREE, LUMBAR THREE-FOUR, LUMBAR FOUR-FIVE ANTEROLATERAL LUMBAR INTERBODY FUSION;  Surgeon: Maeola Harman, MD;  Location: [facility] OR;  Service: Neurosurgery;  Laterality: Right;   BALLOON DILATION N/A 08/27/2021   Procedure: BALLOON DILATION;  Surgeon: Meridee Score Netty Starring., MD;  Location: [facility] ENDOSCOPY;  Service: Gastroenterology;  Laterality: N/A;   BIOPSY  07/09/2021   Procedure: BIOPSY;  Surgeon: Meridee Score Netty Starring., MD;  Location: [facility] ENDOSCOPY;  Service: Gastroenterology;;   COLONOSCOPY WITH PROPOFOL N/A 12/29/2022   Procedure: COLONOSCOPY WITH PROPOFOL;  Surgeon: Corbin Ade, MD;  Location: [facility] ENDO SUITE;  Service: Endoscopy;  Laterality: N/A;   CYST GASTROSTOMY  08/27/2021   Procedure: CYST GASTROSTOMY;  Surgeon: Meridee Score Netty Starring., MD;  Location: [facility] ENDOSCOPY;  Service: Gastroenterology;;   ESOPHAGOGASTRODUODENOSCOPY N/A 07/09/2021   Procedure: ESOPHAGOGASTRODUODENOSCOPY (EGD);  Surgeon: Lemar Lofty., MD;  Location: [facility] ENDOSCOPY;  Service: Gastroenterology;  Laterality: N/A;   ESOPHAGOGASTRODUODENOSCOPY (EGD) WITH PROPOFOL N/A 08/27/2021   Procedure: ESOPHAGOGASTRODUODENOSCOPY (EGD) WITH PROPOFOL;  Surgeon: Meridee Score Netty Starring., MD;  Location: [facility] ENDOSCOPY;  Service: Gastroenterology;   Laterality: N/A;   ESOPHAGOGASTRODUODENOSCOPY (EGD) WITH PROPOFOL N/A 10/08/2021   Procedure: ESOPHAGOGASTRODUODENOSCOPY (EGD) WITH PROPOFOL;  Surgeon: Meridee Score Netty Starring., MD;  Location: [facility] ENDOSCOPY;  Service: Gastroenterology;  Laterality: N/A;   ESOPHAGOGASTRODUODENOSCOPY (EGD) WITH PROPOFOL N/A 12/29/2022   Procedure: ESOPHAGOGASTRODUODENOSCOPY (EGD) WITH PROPOFOL;  Surgeon: Corbin Ade, MD;  Location: [facility] ENDO SUITE;  Service: Endoscopy;  Laterality: N/A;   EUS N/A 07/09/2021   Procedure: UPPER ENDOSCOPIC ULTRASOUND (EUS) RADIAL;  Surgeon: Lemar Lofty., MD;  Location[facility] ENDOSCOPY;  Service: Gastroenterology;  Laterality: N/A;   EUS N/A 08/27/2021   Procedure: UPPER ENDOSCOPIC ULTRASOUND (EUS) LINEAR;  Surgeon: Lemar Lofty., MD;  Location: [facility] ENDOSCOPY;  Service: Gastroenterology;  Laterality: N/A;   FINE NEEDLE ASPIRATION  07/09/2021   Procedure: FINE NEEDLE ASPIRATION (FNA) LINEAR;  Surgeon: Lemar Lofty., MD;  Location: [facility] ENDOSCOPY;  Service: Gastroenterology;;   HEMOSTASIS CLIP PLACEMENT  12/29/2022   Procedure: HEMOSTASIS CLIP PLACEMENT;  Surgeon: Corbin Ade, MD;  Location: [facility] ENDO [facility]ITE;  Service: Endoscopy;;   HOT HEMOSTASIS  12/29/2022   Procedure: HOT HEMOSTASIS (ARGON PLASMA COAGULATION/BICAP);  Surgeon: Corbin Ade, MD;  Location: [facility] ENDO SUITE;  Service: Endoscopy;;   IR IMAGING GUIDED PORT INSERTION  09/14/2021   LAPAROSCOPY N/A 02/15/2022   Procedure: STAGING DIAGNOSTIC;  Surgeon: Fritzi Mandes, MD;  Location: [facility] OR;  Service: General;  Laterality: N/A;   LUMBAR PERCUTANEOUS PEDICLE SCREW 3 LEVEL Bilateral 12/16/2015   Procedure: PERCUTANEOUS PEDICLE SCREWS BILATERALLY AT LUMBAR TWO-FIVE;  Surgeon: Maeola Harman, MD;  Location: [facility] OR;  Service: Neurosurgery;  Laterality: Bilateral;   PANCREATIC STENT PLACEMENT  08/27/2021   Procedure: PANCREATIC STENT PLACEMENT;  Surgeon: Lemar Lofty., MD;  Location: [facility] ENDOSCOPY;  Service:  Gastroenterology;;   POLYPECTOMY  12/29/2022   Procedure: POLYPECTOMY INTESTINAL;  Surgeon: Corbin Ade, MD;  Location: [facility] ENDO [facility];  Service: Endoscopy;;   STENT REMOVAL  10/08/2021   Procedure: STENT REMOVAL;  Surgeon: Lemar Lofty., MD;  Location: [facility] ENDOSCOPY;  Service: Gastroenterology;;   TUBAL LIGATION  1974   WHIPPLE PROCEDURE N/A 02/15/2022   Procedure: ATTEMPTED WHIPPLE PROCEDURE;  Surgeon: Fritzi Mandes, MD;  Location: [facility] OR;  Service: General;  Laterality: N/A;     reports that she has never smoked. She has never used smokeless tobacco. She reports that she does not drink alcohol and does not use drugs.  Allergies  Allergen Reactions   Other Hives and Other (See Comments)    Cherry wood just cut- "smelled it and broke out"; cannot tolerate ANY cherry fragrances, either      Cherry Hives   Wound Dressing Adhesive Rash and Other (See Comments)    Band-Aids = local reaction    Family History  Problem Relation Age of Onset   Pancreatitis Brother        alcoholic pancreatitis   Pancreatic cancer Neg Hx    Colon cancer Neg Hx      Prior to Admission medications   Medication Sig Start Date End Date Taking? Authorizing Provider  acetaminophen (TYLENOL) 500 MG tablet Take 2 tablets (1,000 mg total) by mouth every 8 (eight) hours as needed for moderate pain, headache or fever. Patient taking differently: Take 750-1,000 mg by mouth every  8 (eight) hours as needed for moderate pain (pain score 4-6), headache or fever. 07/21/21   Vassie Loll, MD  apixaban (ELIQUIS) 5 MG TABS tablet Take 1 tablet (5 mg total) by mouth 2 (two) times daily. Resume taking on 12/11 12/30/22 12/25/23  Dorcas Carrow, MD  Aromatic Inhalants (VICKS VAPOINHALER) INHA Inhale 1 puff into the lungs daily as needed (congestion).    [provider]  buPROPion (WELLBUTRIN XL) 300 MG 24 hr tablet Take 300 mg by mouth daily after breakfast. Takes differently from prescribed: 20mg  BID  06/15/21   [provider]  busPIRone (BUSPAR) 10 MG tablet Take 20 mg by mouth 3 (three) times daily. 01/27/21   [provider]  calcium carbonate (TUMS - DOSED IN MG ELEMENTAL CALCIUM) 500 MG chewable tablet Chew 1 tablet (200 mg of elemental calcium total) by mouth 2 (two) times daily as needed for indigestion or heartburn. Patient taking differently: Chew 1-2 tablets by mouth 2 (two) times daily as needed for indigestion or heartburn. 08/31/21   Lewie Chamber, MD  Cholecalciferol (VITAMIN D3) 125 MCG (5000 UT) CAPS Take 5,000 Units by mouth daily.    [provider]  escitalopram (LEXAPRO) 20 MG tablet Take 20 mg by mouth at bedtime.    [provider]  lactose free nutrition (BOOST) LIQD Take 237 mLs by mouth 2 (two) times daily between meals.    [provider]  lidocaine-prilocaine (EMLA) cream Apply a small amount to port a cath site (do not rub in) and cover with plastic wrap 1 hour prior to infusion appointments 09/15/21   Doreatha Massed, MD  LINZESS 72 MCG capsule TAKE 1 CAPSULE BY MOUTH DAILY BEFORE BREAKFAST. Patient taking differently: Take 72 mcg by mouth daily before breakfast. 12/13/22   Mansouraty, Netty Starring., MD  metoprolol succinate (TOPROL-XL) 25 MG 24 hr tablet Take 0.5 tablets (12.5 mg total) by mouth daily after breakfast. 05/09/22   Darlin Drop, DO  mirtazapine (REMERON) 15 MG tablet Take 15 mg by mouth at bedtime.    [provider]  omeprazole (PRILOSEC) 40 MG capsule TAKE 1 CAPSULE (40 MG TOTAL) BY MOUTH DAILY. 10/19/22   Mansouraty, Netty Starring., MD  Polyethyl Glycol-Propyl Glycol (SYSTANE OP) Place 1 drop into both eyes at bedtime.    [provider]  QUEtiapine (SEROQUEL) 25 MG tablet Take 25 mg by mouth 2 (two) times daily. 12/14/22   [provider]  sucralfate (CARAFATE) 1 GM/10ML suspension Take 10 mLs (1 g total) by mouth 4 (four) times daily for 5 days. 12/30/22 01/04/23  Dorcas Carrow, MD     Physical Exam: Vitals:   01/12/23 1153  BP: 113/69  Pulse: 76  Resp: 20  Temp: (!) 97.5 F (36.4 C)  SpO2: 96%    Constitutional: NAD, calm, comfortable,pleasant female Vitals:   01/12/23 1153  BP: 113/69  Pulse: 76  Resp: 20  Temp: (!) 97.5 F (36.4 C)  SpO2: 96%   Eyes: PERRL, lids and conjunctivae normal ENMT: Mucous membranes are moist.  Neck: normal, supple, no masses, no thyromegaly Respiratory: clear to auscultation bilaterally, no wheezing, no crackles. Normal respiratory effort. No accessory muscle use.  Cardiovascular: Regular rate and rhythm, no murmurs / rubs / gallops. No extremity edema.  Abdomen: no tenderness, no masses palpated. No hepatosplenomegaly. Bowel sounds positive.  Musculoskeletal: no clubbing / cyanosis. No joint deformity upper and lower extremities.  Skin: no rashes, lesions, ulcers. No induration Neurologic: CN 2-12 grossly intact.  Strength 5/5  in all 4.  Psychiatric: Normal judgment and insight. Alert and oriented x 3. Normal mood.   Foley Catheter:None  Labs on Admission: I have personally reviewed following labs and imaging studies  CBC: Recent Labs  Lab 01/11/23 1455 01/12/23 1216  WBC 4.7 4.1  NEUTROABS 2.6 2.9  HGB 8.8* 8.3*  HCT 27.4* 25.8*  MCV 97.5 96.3  PLT 243 190   Basic Metabolic Panel: Recent Labs  Lab 01/12/23 1216  NA 135  K 4.2  CL 104  CO2 24  GLUCOSE 117*  BUN 33*  CREATININE 0.86  CALCIUM 8.8*   GFR: Estimated Creatinine Clearance: 42 mL/min (by C-G formula based on SCr of 0.86 mg/dL). Liver Function Tests: Recent Labs  Lab 01/12/23 1216  AST 25  ALT 22  ALKPHOS 102  BILITOT 0.3  PROT 5.9*  ALBUMIN 3.2*   No results for input(s): "LIPASE", "AMYLASE" in the last 168 hours. No results for input(s): "AMMONIA" in the last 168 hours. Coagulation Profile: No results for input(s): "INR", "PROTIME" in the last 168 hours. Cardiac Enzymes: No results for input(s): "CKTOTAL", "CKMB",  "CKMBINDEX", "TROPONINI" in the last 168 hours. BNP (last 3 results) No results for input(s): "PROBNP" in the last 8760 hours. HbA1C: No results for input(s): "HGBA1C" in the last 72 hours. CBG: No results for input(s): "GLUCAP" in the last 168 hours. Lipid Profile: No results for input(s): "CHOL", "HDL", "LDLCALC", "TRIG", "CHOLHDL", "LDLDIRECT" in the last 72 hours. Thyroid Function Tests: No results for input(s): "TSH", "T4TOTAL", "FREET4", "T3FREE", "THYROIDAB" in the last 72 hours. Anemia Panel: No results for input(s): "VITAMINB12", "FOLATE", "FERRITIN", "TIBC", "IRON", "RETICCTPCT" in the last 72 hours. Urine analysis:    Component Value Date/Time   COLORURINE YELLOW 05/06/2022 1225   APPEARANCEUR CLEAR 05/06/2022 1225   LABSPEC 1.017 05/06/2022 1225   PHURINE 5.0 05/06/2022 1225   GLUCOSEU NEGATIVE 05/06/2022 1225   HGBUR NEGATIVE 05/06/2022 1225   BILIRUBINUR NEGATIVE 05/06/2022 1225   KETONESUR NEGATIVE 05/06/2022 1225   PROTEINUR NEGATIVE 05/06/2022 1225   NITRITE NEGATIVE 05/06/2022 1225   LEUKOCYTESUR NEGATIVE 05/06/2022 1225    Radiological Exams on Admission: No results found.   Assessment/Plan Active Problems:   * No active hospital problems. *   Assessment and Plan: No notes have been filed under this hospital service. 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  Colonoscopy did not show any active bleeding.  She  was discharged to home with plan for continuing Prilosec, Carafate.  On presentation, hemoglobin in the range of 8. Continue Protonix iv twice daily for now.  Continue clear liquid diet for now.  GI already consulted and following.  If significant drop in the hemoglobin tomorrow, might need EGD. CT imaging as above.  Will initiate gentle IV fluid.  Acute on  chronic blood loss anemia: Hemoglobin in the range of 8.  Will check iron studies.  Transfuse if hemoglobin drops less than 7  PE: On Eliquis.  Currently being held.  Pancreatic adenocarcinoma: Status post chemoradiation.  Currently on send presents.  Follows with oncology. We recommend to follow-up with oncology as an outpatient.  Hypertension: On metoprolol  Anxiety/depression: On Wellbutrin, Celexa, BuSpar, Remeron at home       Severity of Illness: The appropriate patient status for this patient is OBSERVATION. Observation status is judged to be  reasonable and necessary in order to provide the required intensity of service to ensure the patient's safety. The patient's presenting symptoms, physical exam findings, and initial radiographic and laboratory data in the context of their medical condition is felt to place them at decreased risk for further clinical deterioration. Furthermore, it is anticipated that the patient will be medically stable for discharge from the hospital within 2 midnights of admission.    DVT prophylaxis: SCD Code Status: Full Family Communication: None at bedside Consults called: GI     Burnadette Pop MD [facility]  01/12/2023, 2:36 PM

## 2023-01-12 NOTE — H&P (View-Only) (Signed)
Gastroenterology Consult   Referring Provider: No ref. provider found Primary Care Physician:  [name], DO Primary Gastroenterologist:  Dr. Jena Gauss  Patient ID: Sheryl Bender; [id number]; August 22, 1945   Admit date: 01/12/2023  LOS: 0 days   Date of Consultation: 01/12/2023  Reason for Consultation:  rectal bleeding/anemia   History of Present Illness   Sheryl Bender is a 77 y.o. 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 She also began feeling more weak. Noting some SOB today as well. She has some very mild abdominal discomfort across the top of her abdomen. She had some nausea this morning but no vomiting. She notes sensation of feeling like food sat in her stomach after eating this morning. Stools this morning were solid but she notes more narrow in caliber. She restarted her Eliquis on 12/11. Not taking any NSAIDs. She is taking omeprazole 40mg  daily. She completed 5 day course of carafate after last admission. Thinks last dose of Eliquis was yesterday morning, though not 100% sure if she took it this morning or not.    Colonoscopy:12/29/22  6 mm polyp in the mid sigmoid colon removed, minimal amount of old blood in the cecum with no actively  bleeding lesions anywhere in the lower GI tract.   EGD: 12/29/22 - Normal esophagus. Antral Dielafoy's with active                            bleeding. Sealed with gold probe and clipped                           - Small hiatal hernia.                           -Normal-appearing duodenal bulb and second portion                           -Patient likely has abnormal blood vessels in the                            underlying mucosa, in part, due to radiation and                            neoplastic process itself   Past Medical History:  Diagnosis Date   Anxiety    Arthritis    Depression    Dysrhythmia    hx palpitations greater than 5 yrs -neg echo, stress per pt ? where or dr   GERD (gastroesophageal reflux disease)    occ   Nausea & vomiting 08/24/2021  Pancreatic adenocarcinoma (HCC)    Pancreatic pseudocyst    Port-A-Cath in place 09/15/2021   Pulmonary embolism [facility])    September 2022    Past Surgical History:  Procedure Laterality Date   ANTERIOR LAT LUMBAR FUSION Right 12/16/2015   Procedure: RIGHT LUMBAR TWO-THREE, LUMBAR THREE-FOUR, LUMBAR FOUR-FIVE ANTEROLATERAL LUMBAR INTERBODY FUSION;  Surgeon: Maeola Harman, MD;  Location: [facility] OR;  Service: Neurosurgery;  Laterality: Right;   BALLOON DILATION N/A 08/27/2021   Procedure: BALLOON DILATION;  Surgeon: Meridee Score Netty Starring., MD;  Location: [facility] ENDOSCOPY;  Service: Gastroenterology;  Laterality: N/A;   BIOPSY  07/09/2021   Procedure: BIOPSY;  Surgeon: Meridee Score Netty Starring., MD;  Location: [facility] ENDOSCOPY;  Service: Gastroenterology;;   COLONOSCOPY WITH PROPOFOL N/A 12/29/2022   Procedure: COLONOSCOPY WITH PROPOFOL;  Surgeon: Corbin Ade, MD;  Location: [facility] ENDO SUITE;  Service: Endoscopy;  Laterality: N/A;   CYST GASTROSTOMY  08/27/2021   Procedure: CYST GASTROSTOMY;  Surgeon: Meridee Score Netty Starring., MD;  Location: [facility] ENDOSCOPY;  Service: Gastroenterology;;   ESOPHAGOGASTRODUODENOSCOPY N/A 07/09/2021   Procedure:  ESOPHAGOGASTRODUODENOSCOPY (EGD);  Surgeon: Lemar Lofty., MD;  Location: [facility] ENDOSCOPY;  Service: Gastroenterology;  Laterality: N/A;   ESOPHAGOGASTRODUODENOSCOPY (EGD) WITH PROPOFOL N/A 08/27/2021   Procedure: ESOPHAGOGASTRODUODENOSCOPY (EGD) WITH PROPOFOL;  Surgeon: Meridee Score Netty Starring., MD;  Location: [facility] ENDOSCOPY;  Service: Gastroenterology;  Laterality: N/A;   ESOPHAGOGASTRODUODENOSCOPY (EGD) WITH PROPOFOL N/A 10/08/2021   Procedure: ESOPHAGOGASTRODUODENOSCOPY (EGD) WITH PROPOFOL;  Surgeon: Meridee Score Netty Starring., MD;  Location: [facility] ENDOSCOPY;  Service: Gastroenterology;  Laterality: N/A;   ESOPHAGOGASTRODUODENOSCOPY (EGD) WITH PROPOFOL N/A 12/29/2022   Procedure: ESOPHAGOGASTRODUODENOSCOPY (EGD) WITH PROPOFOL;  Surgeon: Corbin Ade, MD;  Location: [facility] ENDO SUITE;  Service: Endoscopy;  Laterality: N/A;   EUS N/A 07/09/2021   Procedure: UPPER ENDOSCOPIC ULTRASOUND (EUS) RADIAL;  Surgeon: Lemar Lofty., MD;  Location: [facility] ENDOSCOPY;  Service: Gastroenterology;  Laterality: N/A;   EUS N/A 08/27/2021   Procedure: UPPER ENDOSCOPIC ULTRASOUND (EUS) LINEAR;  Surgeon: Lemar Lofty., MD;  Location: [facility] ENDOSCOPY;  Service: Gastroenterology;  Laterality: N/A;   FINE NEEDLE ASPIRATION  07/09/2021   Procedure: FINE NEEDLE ASPIRATION (FNA) LINEAR;  Surgeon: Lemar Lofty., MD;  Location: [facility] ENDOSCOPY;  Service: Gastroenterology;;   HEMOSTASIS CLIP PLACEMENT  12/29/2022   Procedure: HEMOSTASIS CLIP PLACEMENT;  Surgeon: Corbin Ade, MD;  Location: [facility] ENDO SUITE;  Service: Endoscopy;;   HOT HEMOSTASIS  12/29/2022   Procedure: HOT HEMOSTASIS (ARGON PLASMA COAGULATION/BICAP);  Surgeon: Corbin Ade, MD;  Location: [facility] ENDO SUITE;  Service: Endoscopy;;   IR IMAGING GUIDED PORT INSERTION  09/14/2021   LAPAROSCOPY N/A 02/15/2022   Procedure: STAGING DIAGNOSTIC;  Surgeon: Fritzi Mandes, MD;  Location: [facility] OR;  Service: General;  Laterality: N/A;   LUMBAR PERCUTANEOUS PEDICLE  SCREW 3 LEVEL Bilateral 12/16/2015   Procedure: PERCUTANEOUS PEDICLE SCREWS BILATERALLY AT LUMBAR TWO-FIVE;  Surgeon: Maeola Harman, MD;  Location: [facility] OR;  Service: Neurosurgery;  Laterality: Bilateral;   PANCREATIC STENT PLACEMENT  08/27/2021   Procedure: PANCREATIC STENT PLACEMENT;  Surgeon: Lemar Lofty., MD;  Location: [facility] ENDOSCOPY;  Service: Gastroenterology;;   POLYPECTOMY  12/29/2022   Procedure: POLYPECTOMY INTESTINAL;  Surgeon: Corbin Ade, MD;  Location: [facility] ENDO [facility]ITE;  Service: Endoscopy;;   STENT REMOVAL  10/08/2021   Procedure: Francine Graven REMOVAL;  Surgeon: Lemar Lofty., MD;  Location: [facility] ENDOSCOPY;  Service: Gastroenterology;;   TUBAL LIGATION  1974   WHIPPLE PROCEDURE N/A 02/15/2022   Procedure: ATTEMPTED WHIPPLE PROCEDURE;  Surgeon: Fritzi Mandes, MD;  Location: [facility] OR;  Service: General;  Laterality: N/A;    Prior to Admission medications   Medication Sig Start Date End Date Taking? Authorizing Provider  acetaminophen (TYLENOL) 500 MG tablet Take 2 tablets (1,000 mg total) by mouth every 8 (eight) hours as needed for moderate pain, headache or fever. Patient taking differently: Take 750-1,000 mg by mouth every 8 (eight) hours as needed for moderate pain (pain score 4-6), headache or fever. 07/21/21   Vassie Loll, MD  apixaban (ELIQUIS) 5 MG TABS tablet Take 1 tablet (5 mg total) by mouth 2 (two) times daily. Resume taking on 12/11 12/30/22 12/25/23  Dorcas Carrow, MD  Aromatic Inhalants (VICKS VAPOINHALER) INHA Inhale 1 puff into the lungs daily as needed (congestion).    [provider]  buPROPion (WELLBUTRIN XL) 300 MG 24 hr tablet Take 300 mg by mouth daily after breakfast. Takes differently from prescribed: 20mg  BID 06/15/21   [provider]  busPIRone (BUSPAR) 10 MG tablet Take 20 mg by mouth 3 (three) times daily. 01/27/21   [provider]  calcium carbonate (TUMS - DOSED IN MG ELEMENTAL CALCIUM) 500 MG chewable tablet Chew 1  tablet (200 mg of elemental calcium total) by mouth 2 (two) times daily as needed for indigestion or heartburn. Patient taking differently: Chew 1-2 tablets by mouth 2 (two) times daily as needed for indigestion or heartburn. 08/31/21   Lewie Chamber, MD  Cholecalciferol (VITAMIN D3) 125 MCG (5000 UT) CAPS Take 5,000 Units by mouth daily.    [provider]  escitalopram (LEXAPRO) 20 MG tablet Take 20 mg by mouth at bedtime.    [provider]  lactose free nutrition (BOOST) LIQD Take 237 mLs by mouth 2 (two) times daily between meals.    [provider]  lidocaine-prilocaine (EMLA) cream Apply a small amount to port a cath site (do not rub in) and cover with plastic wrap 1 hour prior to infusion appointments 09/15/21   Doreatha Massed, MD  LINZESS 72 MCG capsule TAKE 1 CAPSULE BY MOUTH DAILY BEFORE BREAKFAST. Patient taking differently: Take 72 mcg by mouth daily before breakfast. 12/13/22   Mansouraty, Netty Starring., MD  metoprolol succinate (TOPROL-XL) 25 MG 24 hr tablet Take 0.5 tablets (12.5 mg total) by mouth daily after breakfast. 05/09/22   Darlin Drop, DO  mirtazapine (REMERON) 15 MG tablet Take 15 mg by mouth at bedtime.    [provider]  omeprazole (PRILOSEC) 40 MG capsule TAKE 1 CAPSULE (40 MG TOTAL) BY MOUTH DAILY. 10/19/22   Mansouraty, Netty Starring., MD  Polyethyl Glycol-Propyl Glycol (SYSTANE OP) Place 1 drop into both eyes at bedtime.    [provider]  QUEtiapine (SEROQUEL) 25 MG tablet Take 25 mg by mouth 2 (two) times daily. 12/14/22   [provider]  sucralfate (CARAFATE) 1 GM/10ML suspension Take 10 mLs (1 g total) by mouth 4 (four) times daily for 5 days. 12/30/22 01/04/23  Dorcas Carrow, MD    No current facility-administered medications for this encounter.   Current Outpatient Medications  Medication Sig Dispense Refill   acetaminophen (TYLENOL) 500 MG tablet Take 2 tablets (1,000 mg total) by mouth every 8 (eight)  hours as needed for moderate pain, headache or fever. (Patient taking differently: Take 750-1,000 mg by mouth every 8 (eight) hours as needed for moderate pain (pain score 4-6), headache or fever.)     apixaban (ELIQUIS) 5 MG TABS tablet Take 1 tablet (5 mg total)  by mouth 2 (two) times daily. Resume taking on 12/11     Aromatic Inhalants (VICKS VAPOINHALER) INHA Inhale 1 puff into the lungs daily as needed (congestion).     buPROPion (WELLBUTRIN XL) 300 MG 24 hr tablet Take 300 mg by mouth daily after breakfast. Takes differently from prescribed: 20mg  BID     busPIRone (BUSPAR) 10 MG tablet Take 20 mg by mouth 3 (three) times daily.     calcium carbonate (TUMS - DOSED IN MG ELEMENTAL CALCIUM) 500 MG chewable tablet Chew 1 tablet (200 mg of elemental calcium total) by mouth 2 (two) times daily as needed for indigestion or heartburn. (Patient taking differently: Chew 1-2 tablets by mouth 2 (two) times daily as needed for indigestion or heartburn.)     Cholecalciferol (VITAMIN D3) 125 MCG (5000 UT) CAPS Take 5,000 Units by mouth daily.     escitalopram (LEXAPRO) 20 MG tablet Take 20 mg by mouth at bedtime.     lactose free nutrition (BOOST) LIQD Take 237 mLs by mouth 2 (two) times daily between meals.     lidocaine-prilocaine (EMLA) cream Apply a small amount to port a cath site (do not rub in) and cover with plastic wrap 1 hour prior to infusion appointments 30 g 3   LINZESS 72 MCG capsule TAKE 1 CAPSULE BY MOUTH DAILY BEFORE BREAKFAST. (Patient taking differently: Take 72 mcg by mouth daily before breakfast.) 30 capsule 0   metoprolol succinate (TOPROL-XL) 25 MG 24 hr tablet Take 0.5 tablets (12.5 mg total) by mouth daily after breakfast. 14 tablet 0   mirtazapine (REMERON) 15 MG tablet Take 15 mg by mouth at bedtime.     omeprazole (PRILOSEC) 40 MG capsule TAKE 1 CAPSULE (40 MG TOTAL) BY MOUTH DAILY. 90 capsule 5   Polyethyl Glycol-Propyl Glycol (SYSTANE OP) Place 1 drop into both eyes at bedtime.      QUEtiapine (SEROQUEL) 25 MG tablet Take 25 mg by mouth 2 (two) times daily.     sucralfate (CARAFATE) 1 GM/10ML suspension Take 10 mLs (1 g total) by mouth 4 (four) times daily for 5 days. 200 mL 0    Allergies as of 01/12/2023 - Review Complete 01/12/2023  Allergen Reaction Noted   Other Hives and Other (See Comments) 12/08/2015   Cherry Hives 03/30/2021   Wound dressing adhesive Rash and Other (See Comments) 12/08/2015    Family History  Problem Relation Age of Onset   Pancreatitis Brother        alcoholic pancreatitis   Pancreatic cancer Neg Hx    Colon cancer Neg Hx     Review of Systems   Gen: Denies any fever, chills, loss of appetite, change in weight or weight loss CV: Denies chest pain, heart palpitations, syncope, edema  Resp: Denies shortness of breath with rest, cough, wheezing, coughing up blood, and pleurisy. GI: +nausea +rectal bleeding (dark to maroon colored)  GU : Denies urinary burning, blood in urine, urinary frequency, and urinary incontinence. MS: Denies joint pain, limitation of movement, swelling, cramps, and atrophy.  Derm: Denies rash, itching, dry skin, hives. Psych: Denies depression, anxiety, memory loss, hallucinations, and confusion. Heme: Denies bruising or bleeding Neuro:  Denies any headaches, dizziness, paresthesias, shaking  Physical Exam   Vital Signs in last 24 hours: Temp:  [97.5 F (36.4 C)] 97.5 F (36.4 C) (12/18 1153) Pulse Rate:  [76] 76 (12/18 1153) Resp:  [20] 20 (12/18 1153) BP: (113)/(69) 113/69 (12/18 1153) SpO2:  [96 %] 96 % (12/18 1153)  General:   Alert,  Well-developed, well-nourished, pleasant and cooperative in NAD Head:  Normocephalic and atraumatic. Eyes:  Sclera clear, no icterus.   Conjunctiva pink. Ears:  Normal auditory acuity. Mouth:  No deformity or lesions, dentition normal. Neck:  Supple; no masses Lungs:  Clear throughout to auscultation.   No wheezes, crackles, or rhonchi. No acute  distress. Heart:  Regular rate and rhythm; no murmurs, clicks, rubs,  or gallops. Abdomen:  Soft, and nondistended. Mild TTP of upper abdomen. No masses, hepatosplenomegaly or hernias noted. Normal bowel sounds, without guarding, and without rebound.   Msk:  Symmetrical without gross deformities. Normal posture. Extremities:  Without clubbing or edema. Neurologic:  Alert and  oriented x4. Skin:  Intact without significant lesions or rashes. Psych:  Alert and cooperative. Normal mood and affect.  Labs/Studies   Recent Labs Recent Labs    01/11/23 1455 01/12/23 1216  WBC 4.7 4.1  HGB 8.8* 8.3*  HCT 27.4* 25.8*  PLT 243 190   BMET Recent Labs    01/12/23 1216  NA 135  K 4.2  CL 104  CO2 24  GLUCOSE 117*  BUN 33*  CREATININE 0.86  CALCIUM 8.8*   LFT Recent Labs    01/12/23 1216  PROT 5.9*  ALBUMIN 3.2*  AST 25  ALT 22  ALKPHOS 102  BILITOT 0.3     Assessment   Sheryl Bender is a 77 y.o. year old female with recent GI bleeding secondary to antral Dieulafoy lesions treated earlier this month, with recurrent darker to maroon stools and weakness.   IDA: history of IDA. 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 Now presenting with recurrence of melena/maroon stools and decline in hemoglobin from 9.4 on 12/5 to 8.3 today. She is symptomatic. Had CT Angio GI bleed scan in the ED shows possible gastritis.could be dealing with a recurrent dieulafoy bleed or possible due to findings of friable tissue/abnormal blood vessels from radiation, noted on previous EGD, in setting of recently restarting Eliquis. For now will allow clear liquids, make NPO at midnight and reassess clinical status and hemoglobin in the morning. May repeat EGD pending further drop in hemoglobin or persistent rectal bleeding. Last eliquis per patient was last night.    Plan / Recommendations   PPI BID  Trend h&h, transfuse for hgb <7 Monitor for overt GI  bleeding Carafate 1g QID (hold morning dose prior to possible EGD)  Clear liquids today  NPO Midnight Reassess in the am, consider repeat EGD pending clinical course    01/12/2023, 12:54 PM  Alexio Sroka L. Jeanmarie Hubert, MSN, APRN, AGNP-C Adult-Gerontology Nurse Practitioner [facility]

## 2023-01-12 NOTE — Consult Note (Addendum)
Gastroenterology Consult   Referring Provider: No ref. provider found Primary Care Physician:  [name], DO Primary Gastroenterologist:  Dr. Jena Gauss  Patient ID: Gwena Mulrooney; [id number]; August 22, 1945   Admit date: 01/12/2023  LOS: 0 days   Date of Consultation: 01/12/2023  Reason for Consultation:  rectal bleeding/anemia   History of Present Illness   Aleanna Surrency is a 77 y.o. 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 She also began feeling more weak. Noting some SOB today as well. She has some very mild abdominal discomfort across the top of her abdomen. She had some nausea this morning but no vomiting. She notes sensation of feeling like food sat in her stomach after eating this morning. Stools this morning were solid but she notes more narrow in caliber. She restarted her Eliquis on 12/11. Not taking any NSAIDs. She is taking omeprazole 40mg  daily. She completed 5 day course of carafate after last admission. Thinks last dose of Eliquis was yesterday morning, though not 100% sure if she took it this morning or not.    Colonoscopy:12/29/22  6 mm polyp in the mid sigmoid colon removed, minimal amount of old blood in the cecum with no actively  bleeding lesions anywhere in the lower GI tract.   EGD: 12/29/22 - Normal esophagus. Antral Dielafoy's with active                            bleeding. Sealed with gold probe and clipped                           - Small hiatal hernia.                           -Normal-appearing duodenal bulb and second portion                           -Patient likely has abnormal blood vessels in the                            underlying mucosa, in part, due to radiation and                            neoplastic process itself   Past Medical History:  Diagnosis Date   Anxiety    Arthritis    Depression    Dysrhythmia    hx palpitations greater than 5 yrs -neg echo, stress per pt ? where or dr   GERD (gastroesophageal reflux disease)    occ   Nausea & vomiting 08/24/2021  Pancreatic adenocarcinoma (HCC)    Pancreatic pseudocyst    Port-A-Cath in place 09/15/2021   Pulmonary embolism [facility])    September 2022    Past Surgical History:  Procedure Laterality Date   ANTERIOR LAT LUMBAR FUSION Right 12/16/2015   Procedure: RIGHT LUMBAR TWO-THREE, LUMBAR THREE-FOUR, LUMBAR FOUR-FIVE ANTEROLATERAL LUMBAR INTERBODY FUSION;  Surgeon: Maeola Harman, MD;  Location: [facility] OR;  Service: Neurosurgery;  Laterality: Right;   BALLOON DILATION N/A 08/27/2021   Procedure: BALLOON DILATION;  Surgeon: Meridee Score Netty Starring., MD;  Location: [facility] ENDOSCOPY;  Service: Gastroenterology;  Laterality: N/A;   BIOPSY  07/09/2021   Procedure: BIOPSY;  Surgeon: Meridee Score Netty Starring., MD;  Location: [facility] ENDOSCOPY;  Service: Gastroenterology;;   COLONOSCOPY WITH PROPOFOL N/A 12/29/2022   Procedure: COLONOSCOPY WITH PROPOFOL;  Surgeon: Corbin Ade, MD;  Location: [facility] ENDO SUITE;  Service: Endoscopy;  Laterality: N/A;   CYST GASTROSTOMY  08/27/2021   Procedure: CYST GASTROSTOMY;  Surgeon: Meridee Score Netty Starring., MD;  Location: [facility] ENDOSCOPY;  Service: Gastroenterology;;   ESOPHAGOGASTRODUODENOSCOPY N/A 07/09/2021   Procedure:  ESOPHAGOGASTRODUODENOSCOPY (EGD);  Surgeon: Lemar Lofty., MD;  Location: [facility] ENDOSCOPY;  Service: Gastroenterology;  Laterality: N/A;   ESOPHAGOGASTRODUODENOSCOPY (EGD) WITH PROPOFOL N/A 08/27/2021   Procedure: ESOPHAGOGASTRODUODENOSCOPY (EGD) WITH PROPOFOL;  Surgeon: Meridee Score Netty Starring., MD;  Location: [facility] ENDOSCOPY;  Service: Gastroenterology;  Laterality: N/A;   ESOPHAGOGASTRODUODENOSCOPY (EGD) WITH PROPOFOL N/A 10/08/2021   Procedure: ESOPHAGOGASTRODUODENOSCOPY (EGD) WITH PROPOFOL;  Surgeon: Meridee Score Netty Starring., MD;  Location: [facility] ENDOSCOPY;  Service: Gastroenterology;  Laterality: N/A;   ESOPHAGOGASTRODUODENOSCOPY (EGD) WITH PROPOFOL N/A 12/29/2022   Procedure: ESOPHAGOGASTRODUODENOSCOPY (EGD) WITH PROPOFOL;  Surgeon: Corbin Ade, MD;  Location: [facility] ENDO SUITE;  Service: Endoscopy;  Laterality: N/A;   EUS N/A 07/09/2021   Procedure: UPPER ENDOSCOPIC ULTRASOUND (EUS) RADIAL;  Surgeon: Lemar Lofty., MD;  Location: [facility] ENDOSCOPY;  Service: Gastroenterology;  Laterality: N/A;   EUS N/A 08/27/2021   Procedure: UPPER ENDOSCOPIC ULTRASOUND (EUS) LINEAR;  Surgeon: Lemar Lofty., MD;  Location: [facility] ENDOSCOPY;  Service: Gastroenterology;  Laterality: N/A;   FINE NEEDLE ASPIRATION  07/09/2021   Procedure: FINE NEEDLE ASPIRATION (FNA) LINEAR;  Surgeon: Lemar Lofty., MD;  Location: [facility] ENDOSCOPY;  Service: Gastroenterology;;   HEMOSTASIS CLIP PLACEMENT  12/29/2022   Procedure: HEMOSTASIS CLIP PLACEMENT;  Surgeon: Corbin Ade, MD;  Location: [facility] ENDO SUITE;  Service: Endoscopy;;   HOT HEMOSTASIS  12/29/2022   Procedure: HOT HEMOSTASIS (ARGON PLASMA COAGULATION/BICAP);  Surgeon: Corbin Ade, MD;  Location: [facility] ENDO SUITE;  Service: Endoscopy;;   IR IMAGING GUIDED PORT INSERTION  09/14/2021   LAPAROSCOPY N/A 02/15/2022   Procedure: STAGING DIAGNOSTIC;  Surgeon: Fritzi Mandes, MD;  Location: [facility] OR;  Service: General;  Laterality: N/A;   LUMBAR PERCUTANEOUS PEDICLE  SCREW 3 LEVEL Bilateral 12/16/2015   Procedure: PERCUTANEOUS PEDICLE SCREWS BILATERALLY AT LUMBAR TWO-FIVE;  Surgeon: Maeola Harman, MD;  Location: [facility] OR;  Service: Neurosurgery;  Laterality: Bilateral;   PANCREATIC STENT PLACEMENT  08/27/2021   Procedure: PANCREATIC STENT PLACEMENT;  Surgeon: Lemar Lofty., MD;  Location: [facility] ENDOSCOPY;  Service: Gastroenterology;;   POLYPECTOMY  12/29/2022   Procedure: POLYPECTOMY INTESTINAL;  Surgeon: Corbin Ade, MD;  Location: [facility] ENDO [facility]ITE;  Service: Endoscopy;;   STENT REMOVAL  10/08/2021   Procedure: Francine Graven REMOVAL;  Surgeon: Lemar Lofty., MD;  Location: [facility] ENDOSCOPY;  Service: Gastroenterology;;   TUBAL LIGATION  1974   WHIPPLE PROCEDURE N/A 02/15/2022   Procedure: ATTEMPTED WHIPPLE PROCEDURE;  Surgeon: Fritzi Mandes, MD;  Location: [facility] OR;  Service: General;  Laterality: N/A;    Prior to Admission medications   Medication Sig Start Date End Date Taking? Authorizing Provider  acetaminophen (TYLENOL) 500 MG tablet Take 2 tablets (1,000 mg total) by mouth every 8 (eight) hours as needed for moderate pain, headache or fever. Patient taking differently: Take 750-1,000 mg by mouth every 8 (eight) hours as needed for moderate pain (pain score 4-6), headache or fever. 07/21/21   Vassie Loll, MD  apixaban (ELIQUIS) 5 MG TABS tablet Take 1 tablet (5 mg total) by mouth 2 (two) times daily. Resume taking on 12/11 12/30/22 12/25/23  Dorcas Carrow, MD  Aromatic Inhalants (VICKS VAPOINHALER) INHA Inhale 1 puff into the lungs daily as needed (congestion).    [provider]  buPROPion (WELLBUTRIN XL) 300 MG 24 hr tablet Take 300 mg by mouth daily after breakfast. Takes differently from prescribed: 20mg  BID 06/15/21   [provider]  busPIRone (BUSPAR) 10 MG tablet Take 20 mg by mouth 3 (three) times daily. 01/27/21   [provider]  calcium carbonate (TUMS - DOSED IN MG ELEMENTAL CALCIUM) 500 MG chewable tablet Chew 1  tablet (200 mg of elemental calcium total) by mouth 2 (two) times daily as needed for indigestion or heartburn. Patient taking differently: Chew 1-2 tablets by mouth 2 (two) times daily as needed for indigestion or heartburn. 08/31/21   Lewie Chamber, MD  Cholecalciferol (VITAMIN D3) 125 MCG (5000 UT) CAPS Take 5,000 Units by mouth daily.    [provider]  escitalopram (LEXAPRO) 20 MG tablet Take 20 mg by mouth at bedtime.    [provider]  lactose free nutrition (BOOST) LIQD Take 237 mLs by mouth 2 (two) times daily between meals.    [provider]  lidocaine-prilocaine (EMLA) cream Apply a small amount to port a cath site (do not rub in) and cover with plastic wrap 1 hour prior to infusion appointments 09/15/21   Doreatha Massed, MD  LINZESS 72 MCG capsule TAKE 1 CAPSULE BY MOUTH DAILY BEFORE BREAKFAST. Patient taking differently: Take 72 mcg by mouth daily before breakfast. 12/13/22   Mansouraty, Netty Starring., MD  metoprolol succinate (TOPROL-XL) 25 MG 24 hr tablet Take 0.5 tablets (12.5 mg total) by mouth daily after breakfast. 05/09/22   Darlin Drop, DO  mirtazapine (REMERON) 15 MG tablet Take 15 mg by mouth at bedtime.    [provider]  omeprazole (PRILOSEC) 40 MG capsule TAKE 1 CAPSULE (40 MG TOTAL) BY MOUTH DAILY. 10/19/22   Mansouraty, Netty Starring., MD  Polyethyl Glycol-Propyl Glycol (SYSTANE OP) Place 1 drop into both eyes at bedtime.    [provider]  QUEtiapine (SEROQUEL) 25 MG tablet Take 25 mg by mouth 2 (two) times daily. 12/14/22   [provider]  sucralfate (CARAFATE) 1 GM/10ML suspension Take 10 mLs (1 g total) by mouth 4 (four) times daily for 5 days. 12/30/22 01/04/23  Dorcas Carrow, MD    No current facility-administered medications for this encounter.   Current Outpatient Medications  Medication Sig Dispense Refill   acetaminophen (TYLENOL) 500 MG tablet Take 2 tablets (1,000 mg total) by mouth every 8 (eight)  hours as needed for moderate pain, headache or fever. (Patient taking differently: Take 750-1,000 mg by mouth every 8 (eight) hours as needed for moderate pain (pain score 4-6), headache or fever.)     apixaban (ELIQUIS) 5 MG TABS tablet Take 1 tablet (5 mg total)  by mouth 2 (two) times daily. Resume taking on 12/11     Aromatic Inhalants (VICKS VAPOINHALER) INHA Inhale 1 puff into the lungs daily as needed (congestion).     buPROPion (WELLBUTRIN XL) 300 MG 24 hr tablet Take 300 mg by mouth daily after breakfast. Takes differently from prescribed: 20mg  BID     busPIRone (BUSPAR) 10 MG tablet Take 20 mg by mouth 3 (three) times daily.     calcium carbonate (TUMS - DOSED IN MG ELEMENTAL CALCIUM) 500 MG chewable tablet Chew 1 tablet (200 mg of elemental calcium total) by mouth 2 (two) times daily as needed for indigestion or heartburn. (Patient taking differently: Chew 1-2 tablets by mouth 2 (two) times daily as needed for indigestion or heartburn.)     Cholecalciferol (VITAMIN D3) 125 MCG (5000 UT) CAPS Take 5,000 Units by mouth daily.     escitalopram (LEXAPRO) 20 MG tablet Take 20 mg by mouth at bedtime.     lactose free nutrition (BOOST) LIQD Take 237 mLs by mouth 2 (two) times daily between meals.     lidocaine-prilocaine (EMLA) cream Apply a small amount to port a cath site (do not rub in) and cover with plastic wrap 1 hour prior to infusion appointments 30 g 3   LINZESS 72 MCG capsule TAKE 1 CAPSULE BY MOUTH DAILY BEFORE BREAKFAST. (Patient taking differently: Take 72 mcg by mouth daily before breakfast.) 30 capsule 0   metoprolol succinate (TOPROL-XL) 25 MG 24 hr tablet Take 0.5 tablets (12.5 mg total) by mouth daily after breakfast. 14 tablet 0   mirtazapine (REMERON) 15 MG tablet Take 15 mg by mouth at bedtime.     omeprazole (PRILOSEC) 40 MG capsule TAKE 1 CAPSULE (40 MG TOTAL) BY MOUTH DAILY. 90 capsule 5   Polyethyl Glycol-Propyl Glycol (SYSTANE OP) Place 1 drop into both eyes at bedtime.      QUEtiapine (SEROQUEL) 25 MG tablet Take 25 mg by mouth 2 (two) times daily.     sucralfate (CARAFATE) 1 GM/10ML suspension Take 10 mLs (1 g total) by mouth 4 (four) times daily for 5 days. 200 mL 0    Allergies as of 01/12/2023 - Review Complete 01/12/2023  Allergen Reaction Noted   Other Hives and Other (See Comments) 12/08/2015   Cherry Hives 03/30/2021   Wound dressing adhesive Rash and Other (See Comments) 12/08/2015    Family History  Problem Relation Age of Onset   Pancreatitis Brother        alcoholic pancreatitis   Pancreatic cancer Neg Hx    Colon cancer Neg Hx     Review of Systems   Gen: Denies any fever, chills, loss of appetite, change in weight or weight loss CV: Denies chest pain, heart palpitations, syncope, edema  Resp: Denies shortness of breath with rest, cough, wheezing, coughing up blood, and pleurisy. GI: +nausea +rectal bleeding (dark to maroon colored)  GU : Denies urinary burning, blood in urine, urinary frequency, and urinary incontinence. MS: Denies joint pain, limitation of movement, swelling, cramps, and atrophy.  Derm: Denies rash, itching, dry skin, hives. Psych: Denies depression, anxiety, memory loss, hallucinations, and confusion. Heme: Denies bruising or bleeding Neuro:  Denies any headaches, dizziness, paresthesias, shaking  Physical Exam   Vital Signs in last 24 hours: Temp:  [97.5 F (36.4 C)] 97.5 F (36.4 C) (12/18 1153) Pulse Rate:  [76] 76 (12/18 1153) Resp:  [20] 20 (12/18 1153) BP: (113)/(69) 113/69 (12/18 1153) SpO2:  [96 %] 96 % (12/18 1153)  General:   Alert,  Well-developed, well-nourished, pleasant and cooperative in NAD Head:  Normocephalic and atraumatic. Eyes:  Sclera clear, no icterus.   Conjunctiva pink. Ears:  Normal auditory acuity. Mouth:  No deformity or lesions, dentition normal. Neck:  Supple; no masses Lungs:  Clear throughout to auscultation.   No wheezes, crackles, or rhonchi. No acute  distress. Heart:  Regular rate and rhythm; no murmurs, clicks, rubs,  or gallops. Abdomen:  Soft, and nondistended. Mild TTP of upper abdomen. No masses, hepatosplenomegaly or hernias noted. Normal bowel sounds, without guarding, and without rebound.   Msk:  Symmetrical without gross deformities. Normal posture. Extremities:  Without clubbing or edema. Neurologic:  Alert and  oriented x4. Skin:  Intact without significant lesions or rashes. Psych:  Alert and cooperative. Normal mood and affect.  Labs/Studies   Recent Labs Recent Labs    01/11/23 1455 01/12/23 1216  WBC 4.7 4.1  HGB 8.8* 8.3*  HCT 27.4* 25.8*  PLT 243 190   BMET Recent Labs    01/12/23 1216  NA 135  K 4.2  CL 104  CO2 24  GLUCOSE 117*  BUN 33*  CREATININE 0.86  CALCIUM 8.8*   LFT Recent Labs    01/12/23 1216  PROT 5.9*  ALBUMIN 3.2*  AST 25  ALT 22  ALKPHOS 102  BILITOT 0.3     Assessment   Shajuan Valiant is a 77 y.o. year old female with recent GI bleeding secondary to antral Dieulafoy lesions treated earlier this month, with recurrent darker to maroon stools and weakness.   IDA: history of IDA. 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 Now presenting with recurrence of melena/maroon stools and decline in hemoglobin from 9.4 on 12/5 to 8.3 today. She is symptomatic. Had CT Angio GI bleed scan in the ED shows possible gastritis.could be dealing with a recurrent dieulafoy bleed or possible due to findings of friable tissue/abnormal blood vessels from radiation, noted on previous EGD, in setting of recently restarting Eliquis. For now will allow clear liquids, make NPO at midnight and reassess clinical status and hemoglobin in the morning. May repeat EGD pending further drop in hemoglobin or persistent rectal bleeding. Last eliquis per patient was last night.    Plan / Recommendations   PPI BID  Trend h&h, transfuse for hgb <7 Monitor for overt GI  bleeding Carafate 1g QID (hold morning dose prior to possible EGD)  Clear liquids today  NPO Midnight Reassess in the am, consider repeat EGD pending clinical course    01/12/2023, 12:54 PM  Alexio Sroka L. Jeanmarie Hubert, MSN, APRN, AGNP-C Adult-Gerontology Nurse Practitioner [facility]

## 2023-01-12 NOTE — Telephone Encounter (Signed)
Patient called and is having black diarrhea that started yesterday afternoon, SOB and weak. Hgb was not alarming yesterday. She was instructed to come to the ER. Dr. Jena Gauss made aware.

## 2023-01-13 ENCOUNTER — Observation Stay (HOSPITAL_COMMUNITY): Payer: Medicare Other | Admitting: Certified Registered Nurse Anesthetist

## 2023-01-13 ENCOUNTER — Ambulatory Visit: Payer: Medicare Other | Admitting: Gastroenterology

## 2023-01-13 ENCOUNTER — Encounter (HOSPITAL_COMMUNITY)
Admission: EM | Disposition: A | Discharge status: Home / Self Care | Payer: Self-pay | Source: Home / Self Care | Attending: Internal Medicine

## 2023-01-13 DIAGNOSIS — D62 Acute posthemorrhagic anemia: Secondary | ICD-10-CM | POA: Diagnosis not present

## 2023-01-13 DIAGNOSIS — K31819 Angiodysplasia of stomach and duodenum without bleeding: Secondary | ICD-10-CM

## 2023-01-13 DIAGNOSIS — K2971 Gastritis, unspecified, with bleeding: Secondary | ICD-10-CM | POA: Diagnosis not present

## 2023-01-13 DIAGNOSIS — K922 Gastrointestinal hemorrhage, unspecified: Secondary | ICD-10-CM | POA: Diagnosis not present

## 2023-01-13 HISTORY — PX: HOT HEMOSTASIS: SHX5433

## 2023-01-13 HISTORY — PX: ESOPHAGOGASTRODUODENOSCOPY (EGD) WITH PROPOFOL: SHX5813

## 2023-01-13 LAB — BASIC METABOLIC PANEL
Anion gap: 5 (ref 5–15)
BUN: 20 mg/dL (ref 8–23)
CO2: 24 mmol/L (ref 22–32)
Calcium: 8.2 mg/dL — ABNORMAL LOW (ref 8.9–10.3)
Chloride: 109 mmol/L (ref 98–111)
Creatinine, Ser: 0.8 mg/dL (ref 0.44–1.00)
GFR, Estimated: >60 mL/min (ref >60)
Glucose, Bld: 83 mg/dL (ref 70–99)
Potassium: 3.7 mmol/L (ref 3.5–5.1)
Sodium: 138 mmol/L (ref 135–145)

## 2023-01-13 LAB — CBC
HCT: 23.1 % — ABNORMAL LOW (ref 36.0–46.0)
Hemoglobin: 7.3 g/dL — ABNORMAL LOW (ref 12.0–15.0)
MCH: 30.7 pg (ref 26.0–34.0)
MCHC: 31.6 g/dL (ref 30.0–36.0)
MCV: 97.1 fL (ref 80.0–100.0)
Platelets: 194 10*3/uL (ref 150–400)
RBC: 2.38 MIL/uL — ABNORMAL LOW (ref 3.87–5.11)
RDW: 16.1 % — ABNORMAL HIGH (ref 11.5–15.5)
WBC: 2.8 10*3/uL — ABNORMAL LOW (ref 4.0–10.5)
nRBC: 0 % (ref 0.0–0.2)

## 2023-01-13 LAB — IRON AND TIBC
Iron: 42 ug/dL (ref 28–170)
Saturation Ratios: 14 % (ref 10.4–31.8)
TIBC: 308 ug/dL (ref 250–450)
UIBC: 266 ug/dL

## 2023-01-13 SURGERY — ESOPHAGOGASTRODUODENOSCOPY (EGD) WITH PROPOFOL
Anesthesia: General

## 2023-01-13 MED ORDER — PHENYLEPHRINE 80 MCG/ML (10ML) SYRINGE FOR IV PUSH (FOR BLOOD PRESSURE SUPPORT)
PREFILLED_SYRINGE | INTRAVENOUS | Status: DC | PRN
  Administered 2023-01-13 (×3): 160 ug via INTRAVENOUS

## 2023-01-13 MED ORDER — SODIUM CHLORIDE FLUSH 0.9 % IV SOLN
INTRAVENOUS | Status: AC
  Filled 2023-01-13: qty 10

## 2023-01-13 MED ORDER — PROPOFOL 10 MG/ML IV BOLUS
INTRAVENOUS | Status: DC | PRN
  Administered 2023-01-13: 40 mg via INTRAVENOUS
  Administered 2023-01-13: 30 mg via INTRAVENOUS
  Administered 2023-01-13 (×2): 40 mg via INTRAVENOUS
  Administered 2023-01-13: 80 mg via INTRAVENOUS
  Administered 2023-01-13: 40 mg via INTRAVENOUS

## 2023-01-13 MED ORDER — LIDOCAINE HCL (PF) 2 % IJ SOLN
INTRAMUSCULAR | Status: DC | PRN
  Administered 2023-01-13: 80 mg via INTRADERMAL

## 2023-01-13 MED ORDER — LACTATED RINGERS IV SOLN: INTRAVENOUS | Status: DC | PRN

## 2023-01-13 MED ORDER — CHLORHEXIDINE GLUCONATE CLOTH 2 % EX PADS
6.0000 | MEDICATED_PAD | Freq: Every day | CUTANEOUS | Status: DC
  Administered 2023-01-14 – 2023-01-15 (×2): 6 via TOPICAL

## 2023-01-13 MED ORDER — ACETAMINOPHEN 325 MG PO TABS
650.0000 mg | ORAL_TABLET | Freq: Four times a day (QID) | ORAL | Status: DC | PRN
  Administered 2023-01-13: 650 mg via ORAL

## 2023-01-13 NOTE — Transfer of Care (Signed)
Immediate Anesthesia Transfer of Care Note  [name]  Procedure(s) Performed: ESOPHAGOGASTRODUODENOSCOPY (EGD) WITH PROPOFOL HOT HEMOSTASIS (ARGON PLASMA COAGULATION/BICAP)  Patient Location: PACU  Anesthesia Type:General  Level of Consciousness: drowsy  Airway & Oxygen Therapy: Patient Spontanous Breathing  Post-op Assessment: Report given to RN and Post -op Vital signs reviewed and stable  Post vital signs: Reviewed and stable  Last Vitals:  Vitals Value Taken Time  BP 95/37   Temp 98.3   Pulse 65 01/13/23 1611  Resp 18 01/13/23 1611  SpO2 100 % 01/13/23 1611  Vitals shown include unfiled device data.  Last Pain:  Vitals:   01/13/23 1541  TempSrc:   PainSc: 0-No pain         Complications: No notable events documented.

## 2023-01-13 NOTE — Progress Notes (Signed)
PROGRESS NOTE  Sheryl Bender  ZOX:[id number] DOB: [birth year] DOA: 01/12/2023 PCP: [name], DO   Brief Narrative: Sheryl Bender is a 77 y.o. 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  Colonoscopy did not show any active bleeding.  She  was discharged to home with plan for continuing Prilosec, Carafate.  On presentation, hemoglobin in the range of 8. Continue Protonix iv twice daily for now.GI already consulted and following. CT angio GI bleed protocol is negative. Hemoglobin dropped to the range of 7.3 today.  Could be also from hemodilution.  Possible plan for EGD    Acute on chronic blood loss anemia: Hemoglobin in the range of 8.  Will check iron studies.  Transfuse if hemoglobin drops less than 7   PE: On Eliquis.  Currently being held.   Pancreatic adenocarcinoma: Status post chemoradiation.  Currently on monitoring  Follows with oncology. We recommend to follow-up with oncology as an outpatient.   Hypertension: On metoprolol at home,currently on hold   Anxiety/depression: On Wellbutrin, Celexa, BuSpar,  Remeron at home          DVT prophylaxis:SCDs Start: 01/12/23 1448     Code Status: Full Code  Family Communication: Called son [name] and daughter Efraim Kaufmann on phone,calls not received  Patient status:Obs  Patient is from :Home  Anticipated discharge WJ:XBJY  Estimated DC date:1-2 days   Consultants: GI  Procedures:None yet  Antimicrobials:  Anti-infectives (From admission, onward)    None       Subjective: Patient seen and examined at bedside today.  Appears comfortable.  Lying in bed.  No nausea or vomiting or abdominal pain today.  No bowel movement after admission.  Objective: Vitals:   01/12/23 1200 01/12/23 1625 01/12/23 2029 01/13/23 0446  BP: 109/65 115/63 109/60 119/61  Pulse: 76 74 69 71  Resp:  18 16 18   Temp:  98.2 F (36.8 C) 98.4 F (36.9 C) 98.7 F (37.1 C)  TempSrc:  Oral    SpO2: 95% 99% 98% 98%  Weight:  59.2 kg    Height:  5' 2.5" (1.588 m)      Intake/Output Summary (Last 24 hours) at 01/13/2023 1224 Last data filed at 01/13/2023 0300 Gross per 24 hour  Intake 544.47 ml  Output --  Net 544.47 ml   Filed Weights   01/12/23 1625  Weight: 59.2 kg    Examination:  General exam: Overall comfortable, not in distress HEENT: PERRL Respiratory system:  no wheezes or crackles  Cardiovascular system: S1 & S2 heard, RRR.  Gastrointestinal system: Abdomen is nondistended, soft and nontender. Central nervous system: Alert and oriented Extremities: No edema, no clubbing ,no cyanosis Skin: No rashes, no  ulcers,no icterus     Data Reviewed: I have personally reviewed following labs and imaging studies  CBC: Recent Labs  Lab 01/11/23 1455 01/12/23 1216 01/13/23 0434  WBC 4.7 4.1 2.8*  NEUTROABS 2.6 2.9  --   HGB 8.8* 8.3* 7.3*  HCT 27.4* 25.8* 23.1*  MCV 97.5 96.3 97.1  PLT 243 190 194   Basic Metabolic Panel: Recent Labs  Lab 01/12/23 1216 01/13/23 0434  NA 135 138  K 4.2 3.7  CL 104 109  CO2 24 24  GLUCOSE  117* 83  BUN 33* 20  CREATININE 0.86 0.80  CALCIUM 8.8* 8.2*     No results found for this or any previous visit (from the past 240 hours).   Radiology Studies: CT ANGIO GI BLEED Result Date: 01/12/2023 CLINICAL DATA:  Abdominal pain, melena, history of pancreatic cancer * Tracking Code: BO * EXAM: CTA ABDOMEN AND PELVIS WITHOUT AND WITH CONTRAST TECHNIQUE: Multidetector CT imaging of the abdomen and pelvis was performed using the standard protocol during bolus administration of intravenous contrast. Multiplanar reconstructed images and MIPs were obtained and reviewed to evaluate the vascular anatomy. RADIATION DOSE REDUCTION: This exam was performed according to the departmental dose-optimization program which includes automated exposure control, adjustment of the mA and/or kV according to patient size and/or use of iterative reconstruction technique. CONTRAST:  75mL OMNIPAQUE IOHEXOL 350 MG/ML SOLN COMPARISON:  PET-CT, 10/28/2022, CT abdomen pelvis, 09/30/2022 FINDINGS: VASCULAR Normal contour and caliber of the abdominal aorta. No evidence of aneurysm, dissection, or other acute aortic pathology. Standard branching pattern of the abdominal aorta with solitary bilateral renal arteries. Moderate mixed calcific atherosclerosis. Review of the MIP images confirms the above findings. NON-VASCULAR Lower Chest: No acute findings.  Coronary artery calcifications. Hepatobiliary: No solid liver abnormality is seen. Unchanged subcentimeter low-attenuation no gallstones, gallbladder wall thickening, or biliary dilatation. Pancreas: Unchanged post treatment appearance of the pancreatic head (series 15, image 25). Severe ductal dilatation and pancreatic atrophy distal to the pancreatic head, duct again measuring up to 1.1 cm in caliber (series 15, image 21). Spleen: Normal in size without significant abnormality. Adrenals/Urinary Tract: Adrenal glands are unremarkable. Numerous benign bilateral parapelvic renal  cysts, for which no further follow-up or characterization is required. Kidneys are otherwise normal, without renal calculi, solid lesion, or hydronephrosis. Bladder is unremarkable. Stomach/Bowel: Thickening of the gastric antrum and pylorus (series 15, image 21). Status post appendectomy. No evidence of bowel wall thickening, distention, or inflammatory changes. Lymphatic: No enlarged abdominal or pelvic lymph nodes. Reproductive: No mass or other significant abnormality. Other: No abdominal wall hernia or abnormality. No ascites. Musculoskeletal: No acute osseous findings. IMPRESSION: 1. Thickening of the gastric antrum and pylorus, suggestive of gastritis; as on prior examinations this is possibly secondary to radiation therapy. 2. No intraluminal contrast extravasation or other findings to specifically localize reported GI bleeding. 3. Normal contour and caliber of the abdominal aorta. No evidence of aneurysm, dissection, or other acute aortic pathology. Moderate atherosclerosis. 4. Unchanged post treatment appearance of the pancreatic head. Severe ductal dilatation and pancreatic atrophy distal to the pancreatic head. 5. Coronary artery disease. Aortic Atherosclerosis (ICD10-I70.0). Electronically Signed   By: Jearld Lesch M.D.   On: 01/12/2023 14:43    Scheduled Meds:  buPROPion  300 mg Oral QPC breakfast   busPIRone  20 mg Oral TID   Chlorhexidine Gluconate Cloth  6 each Topical Daily   escitalopram  20 mg Oral QHS   mirtazapine  15 mg Oral QHS  pantoprazole (PROTONIX) IV  40 mg Intravenous Q12H   sucralfate  1 g Oral TID WC & HS   Continuous Infusions:  sodium chloride 75 mL/hr at 01/12/23 1708     LOS: 0 days   Burnadette Pop, MD [facility] P12/19/2024, 12:24 PM

## 2023-01-13 NOTE — TOC CM/SW Note (Signed)
Transition of Care [facility]) - Inpatient Brief Assessment   Patient Details  Name: Sheryl Bender MRN: [id number] Date of Birth: [birth year]  Transition of Care [facility] CM/SW Contact:    Villa Herb, LCSWA Phone Number: 01/13/2023, 9:48 AM   Clinical Narrative: Transition of Care Department [facility]) has reviewed patient and no TOC needs have been identified at this time. We will continue to monitor patient advancement through interdisciplinary progression rounds. If new patient transition needs arise, please place a TOC consult.  Transition of Care Asessment: Insurance and Status: Insurance coverage has been reviewed Patient has primary care physician: Yes Home environment has been reviewed: from home Prior level of function:: independent Prior/Current Home Services: No current home services Social Drivers of Health Review: SDOH reviewed no interventions necessary Readmission risk has been reviewed: Yes Transition of care needs: no transition of care needs at this time

## 2023-01-13 NOTE — Anesthesia Preprocedure Evaluation (Signed)
Anesthesia Evaluation  Patient identified by MRN, date of birth, ID band Patient awake    Reviewed: Allergy & Precautions, H&P , NPO status , Patient's Chart, lab work & pertinent test results, reviewed documented beta blocker date and time   Airway Mallampati: II  TM Distance: >3 FB Neck ROM: full    Dental no notable dental hx.    Pulmonary neg pulmonary ROS   Pulmonary exam normal breath sounds clear to auscultation       Cardiovascular Exercise Tolerance: Good hypertension, negative cardio ROS + dysrhythmias  Rhythm:regular Rate:Normal     Neuro/Psych  PSYCHIATRIC DISORDERS Anxiety Depression    negative neurological ROS  negative psych ROS   GI/Hepatic negative GI ROS, Neg liver ROS,GERD  ,,  Endo/Other  negative endocrine ROS    Renal/GU negative Renal ROS  negative genitourinary   Musculoskeletal   Abdominal   Peds  Hematology negative hematology ROS (+) Blood dyscrasia, anemia   Anesthesia Other Findings   Reproductive/Obstetrics negative OB ROS                             Anesthesia Physical Anesthesia Plan  ASA: 3 and emergent  Anesthesia Plan: General   Post-op Pain Management:    Induction:   PONV Risk Score and Plan: Propofol infusion  Airway Management Planned:   Additional Equipment:   Intra-op Plan:   Post-operative Plan:   Informed Consent: I have reviewed the patients History and Physical, chart, labs and discussed the procedure including the risks, benefits and alternatives for the proposed anesthesia with the patient or authorized representative who has indicated his/her understanding and acceptance.     Dental Advisory Given  Plan Discussed with: CRNA  Anesthesia Plan Comments:        Anesthesia Quick Evaluation

## 2023-01-13 NOTE — Progress Notes (Addendum)
Patient underwent EGD under propofol sedation.  Tolerated the procedure adequately.   FINDINGS:  - Normal esophagus. - Gastroesophageal flap valve classified as Hill Grade II ( fold present, opens with respiration) .  - Gastritis with hemorrhage. Treated with argon plasma coagulation ( APC) . Treated with bipolar cautery.  - Normal duodenal bulb and second portion of the duodenum.  - No specimens collected.   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

## 2023-01-13 NOTE — Progress Notes (Addendum)
Gastroenterology Progress Note   Referring Provider: No ref. provider found Primary Care Physician:  [name], DO Primary Gastroenterologist:  Roetta Sessions, MD  Patient ID: Sheryl Bender; [id number]; Apr 03, 1945   Subjective:    Restarted Eliquis on 12/11 per D/C summary. Started seeing dark red stools Sunday with few spots of brb. Tuesday morning, stools more maroon in color. Feeling weaker. Some DOE.   Today, she reports last BM 24 hours ago. She has very mild upper abdominal tenderness chronically. Some nausea but no vomiting. She is not sure when her last dose of Eliquis was, could have been Tuesday night or Wednesday morning.   Objective:   Vital signs in last 24 hours: Temp:  [97.5 F (36.4 C)-98.7 F (37.1 C)] 98.7 F (37.1 C) (12/19 0446) Pulse Rate:  [69-76] 71 (12/19 0446) Resp:  [16-20] 18 (12/19 0446) BP: (109-119)/(60-69) 119/61 (12/19 0446) SpO2:  [95 %-99 %] 98 % (12/19 0446) Weight:  [59.2 kg] 59.2 kg (12/18 1625) Last BM Date : 01/12/23 General:   Alert,  Well-developed, well-nourished, pleasant and cooperative in NAD Head:  Normocephalic and atraumatic. Eyes:  Sclera clear, no icterus.  . Abdomen:  Soft,  nondistended. Mild epigastric tenderness. Normal bowel sounds, without guarding, and without rebound.   Extremities:  Without clubbing, deformity or edema. Neurologic:  Alert and  oriented x4;  grossly normal neurologically. Skin:  Intact without significant lesions or rashes. Psych:  Alert and cooperative. Normal mood and affect.  Intake/Output from previous day: 12/18 0701 - 12/19 0700 In: 544.5 [P.O.:480; I.V.:64.5] Out: -  Intake/Output this shift: No intake/output data recorded.  Lab Results: CBC Recent Labs    01/11/23 1455 01/12/23 1216 01/13/23 0434  WBC 4.7 4.1 2.8*  HGB 8.8* 8.3* 7.3*  HCT 27.4* 25.8* 23.1*  MCV 97.5 96.3 97.1  PLT 243 190 194   BMET Recent Labs    01/12/23 1216 01/13/23 0434  NA 135 138  K 4.2 3.7   CL 104 109  CO2 24 24  GLUCOSE 117* 83  BUN 33* 20  CREATININE 0.86 0.80  CALCIUM 8.8* 8.2*   LFTs Recent Labs    01/12/23 1216  BILITOT 0.3  ALKPHOS 102  AST 25  ALT 22  PROT 5.9*  ALBUMIN 3.2*   No results for input(s): "LIPASE" in the last 72 hours. PT/INR No results for input(s): "LABPROT", "INR" in the last 72 hours.       Imaging Studies: CT ANGIO GI BLEED Result Date: 01/12/2023 CLINICAL DATA:  Abdominal pain, melena, history of pancreatic cancer * Tracking Code: BO * EXAM: CTA ABDOMEN AND PELVIS WITHOUT AND WITH CONTRAST TECHNIQUE: Multidetector CT imaging of the abdomen and pelvis was performed using the standard protocol during bolus administration of intravenous contrast. Multiplanar reconstructed images and MIPs were obtained and reviewed to evaluate the vascular anatomy. RADIATION DOSE REDUCTION: This exam was performed according to the departmental dose-optimization program which includes automated exposure control, adjustment of the mA and/or kV according to patient size and/or use of iterative reconstruction technique. CONTRAST:  75mL OMNIPAQUE IOHEXOL 350 MG/ML SOLN COMPARISON:  PET-CT, 10/28/2022, CT abdomen pelvis, 09/30/2022 FINDINGS: VASCULAR Normal contour and caliber of the abdominal aorta. No evidence of aneurysm, dissection, or other acute aortic pathology. Standard branching pattern of the abdominal aorta with solitary bilateral renal arteries. Moderate mixed calcific atherosclerosis. Review of the MIP images confirms the above findings. NON-VASCULAR Lower Chest: No acute findings.  Coronary artery calcifications. Hepatobiliary: No solid liver abnormality is seen.  Unchanged subcentimeter low-attenuation no gallstones, gallbladder wall thickening, or biliary dilatation. Pancreas: Unchanged post treatment appearance of the pancreatic head (series 15, image 25). Severe ductal dilatation and pancreatic atrophy distal to the pancreatic head, duct again measuring up  to 1.1 cm in caliber (series 15, image 21). Spleen: Normal in size without significant abnormality. Adrenals/Urinary Tract: Adrenal glands are unremarkable. Numerous benign bilateral parapelvic renal cysts, for which no further follow-up or characterization is required. Kidneys are otherwise normal, without renal calculi, solid lesion, or hydronephrosis. Bladder is unremarkable. Stomach/Bowel: Thickening of the gastric antrum and pylorus (series 15, image 21). Status post appendectomy. No evidence of bowel wall thickening, distention, or inflammatory changes. Lymphatic: No enlarged abdominal or pelvic lymph nodes. Reproductive: No mass or other significant abnormality. Other: No abdominal wall hernia or abnormality. No ascites. Musculoskeletal: No acute osseous findings. IMPRESSION: 1. Thickening of the gastric antrum and pylorus, suggestive of gastritis; as on prior examinations this is possibly secondary to radiation therapy. 2. No intraluminal contrast extravasation or other findings to specifically localize reported GI bleeding. 3. Normal contour and caliber of the abdominal aorta. No evidence of aneurysm, dissection, or other acute aortic pathology. Moderate atherosclerosis. 4. Unchanged post treatment appearance of the pancreatic head. Severe ductal dilatation and pancreatic atrophy distal to the pancreatic head. 5. Coronary artery disease. Aortic Atherosclerosis (ICD10-I70.0). Electronically Signed   By: Jearld Lesch M.D.   On: 01/12/2023 14:43  [2 weeks]  Assessment:   Sheryl Bender is a 77 y.o. year old female with recent GI bleeding secondary to antral Dieulafoy lesions treated earlier this month, with recurrent darker to maroon stools and weakness.    Anemia/GI bleeding: Recent admission with bleeding dieulafoy lesions in gastric antrum, treated at time of EGD 12/29/22. Small polyp removed from the colon with cold snare at the same time. Now presenting with recurrence of melena/maroon stools and  decline in hemoglobin from 8.9 on December 6 to 8.8 on Tuesday, 8.3 on Wednesday, down to 7.3 today. She is symptomatic. Had CT Angio GI bleed scan in the ED negative for active bleeding, shows possible gastritis. Could be dealing with a recurrent occult dieulafoy bleed, bleeding from her small bowel due in part to radiation effect, bleeding through the pancreatic duct from known carcinoma or other occult lesion.   At this time her last BM was 24 hours ago. It is unclear when her last dose of Eliquis was, possibly Tuesday night or Wednesday morning.        Plan:   Continue to monitor H/H, transfuse if Hgb <7. Continue to monitor for recurrent bleeding.  Continue PPI BID. She may need repeat EGD, timing dependent on last dose of Eliquis, possibly late afternoon today vs tomorrow. Continue NPO until plan determined.    LOS: 0 days   Leanna Battles. Dixon Boos [facility] [phone] 12/19/20248:22 AM   Addendum: Last dose of Eliquis per patient was Wednesday morning. Discussed with Dr. Tasia Catchings. Discussed with patient. Plan to proceed with EGD today.  I have discussed the risks, alternatives, benefits with regards to but not limited to the risk of reaction to medication, bleeding, infection, perforation and the patient is agreeable to proceed. Written consent to be obtained.  Leanna Battles. Dixon Boos [facility] [phone] 12/19/20242:46 PM

## 2023-01-13 NOTE — Plan of Care (Signed)

## 2023-01-13 NOTE — Anesthesia Procedure Notes (Signed)
Date/Time: 01/13/2023 3:45 PM  Performed by: Julian Reil, CRNAPre-anesthesia Checklist: Patient identified, Emergency Drugs available, Suction available and Patient being monitored Patient Re-evaluated:Patient Re-evaluated prior to induction Oxygen Delivery Method: Nasal cannula Induction Type: IV induction Placement Confirmation: positive ETCO2 Comments: Optiflow High Flow [place] O2 used.

## 2023-01-13 NOTE — Interval H&P Note (Signed)
History and Physical Interval Note:  01/13/2023 3:45 PM  Sheryl Bender  has presented today for surgery, with the diagnosis of upper gastrointestinal bleeding.  The various methods of treatment have been discussed with the patient and family. After consideration of risks, benefits and other options for treatment, the patient has consented to  Procedure(s): ESOPHAGOGASTRODUODENOSCOPY (EGD) WITH PROPOFOL (N/A) as a surgical intervention.  The patient's history has been reviewed, patient examined, no change in status, stable for surgery.  I have reviewed the patient's chart and labs.  Questions were answered to the patient's satisfaction.     [name]

## 2023-01-13 NOTE — Op Note (Signed)
[facility] Patient Name: Sheryl Bender Procedure Date: 01/13/2023 3:20 PM MRN: [id number] Date of Birth: 03/30/1945 Attending MD: Sanjuan Dame , MD, [id number] CSN: [id number] Age: 77 Admit Type: Outpatient Procedure:                Upper GI endoscopy Indications:              Acute post hemorrhagic anemia, Melena Providers:                Sanjuan Dame, MD, Edrick Kins, RN, Dyann Ruddle Referring MD:              Medicines:                Monitored Anesthesia Care Complications:            No immediate complications. Estimated Blood Loss:     Estimated blood loss was minimal. Procedure:                Pre-Anesthesia Assessment:                           - Prior to the procedure, a History and Physical                            was performed, and patient medications and                            allergies were reviewed. The patient's tolerance of                            previous anesthesia was also reviewed. The risks                            and benefits of the procedure and the sedation                            options and risks were discussed with the patient.                            All questions were answered, and informed consent                            was obtained. Prior Anticoagulants: The patient has                            taken Eliquis (apixaban), last dose was 2 days                            prior to procedure. ASA Grade Assessment: IV - A                            patient with severe systemic disease that is a                            constant threat to life. After reviewing the risks  and benefits, the patient was deemed in                            satisfactory condition to undergo the procedure.                           After obtaining informed consent, the endoscope was                            passed under direct vision. Throughout the                            procedure, the patient's blood pressure,  pulse, and                            oxygen saturations were monitored continuously. The                            GIF-H190 ([id number]) scope was introduced through the                            mouth, and advanced to the second part of duodenum.                            The upper GI endoscopy was accomplished without                            difficulty. The patient tolerated the procedure                            well. Scope In: 3:46:35 PM Scope Out: 4:06:00 PM Total Procedure Duration: 0 hours 19 minutes 25 seconds  Findings:      The examined esophagus was normal.      The gastroesophageal flap valve was visualized endoscopically and       classified as Hill Grade II (fold present, opens with respiration).      Diffuse severe inflammation with hemorrhage characterized by adherent       blood and erythema was found in the gastric antrum and at the pylorus.       Coagulation for hemostasis using argon plasma at 2 liters/minute and 30       watts was successful. Coagulation for hemostasis using bipolar probe was       successful.      The duodenal bulb and second portion of the duodenum were normal.      There is no endoscopic evidence of bleeding in the duodenal bulb and in       the second portion of the duodenum. Impression:               - Normal esophagus.                           - Gastroesophageal flap valve classified as Hill                            Grade II (fold present, opens with respiration).                           -  Gastritis with hemorrhage. Treated with argon                            plasma coagulation (APC). Treated with bipolar                            cautery.                           - Normal duodenal bulb and second portion of the                            duodenum.                           - No specimens collected.                           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 Moderate  Sedation:      Per Anesthesia Care Recommendation:           PPI BID for 4 weeks than daily thereafter                           Transfuse as needed                           advance diet as tolerated Procedure Code(s):        --- Professional ---                           [phone], Esophagogastroduodenoscopy, flexible,                            transoral; with control of bleeding, any method Diagnosis Code(s):        --- Professional ---                           K29.71, Gastritis, unspecified, with bleeding                           D62, Acute posthemorrhagic anemia                           K92.1, Melena (includes Hematochezia) CPT copyright 2022 American [employer] Association. All rights reserved. The codes documented in this report are preliminary and upon coder review may  be revised to meet current compliance requirements. Sanjuan Dame, MD Sanjuan Dame, MD 01/13/2023 4:17:52 PM This report has been signed electronically. Number of Addenda: 0

## 2023-01-14 ENCOUNTER — Telehealth: Payer: Self-pay | Admitting: Gastroenterology

## 2023-01-14 DIAGNOSIS — F32A Depression, unspecified: Secondary | ICD-10-CM | POA: Diagnosis present

## 2023-01-14 DIAGNOSIS — Z79899 Other long term (current) drug therapy: Secondary | ICD-10-CM | POA: Diagnosis not present

## 2023-01-14 DIAGNOSIS — Z9221 Personal history of antineoplastic chemotherapy: Secondary | ICD-10-CM | POA: Diagnosis not present

## 2023-01-14 DIAGNOSIS — Z86711 Personal history of pulmonary embolism: Secondary | ICD-10-CM | POA: Diagnosis not present

## 2023-01-14 DIAGNOSIS — K31819 Angiodysplasia of stomach and duodenum without bleeding: Secondary | ICD-10-CM

## 2023-01-14 DIAGNOSIS — Z7901 Long term (current) use of anticoagulants: Secondary | ICD-10-CM | POA: Diagnosis not present

## 2023-01-14 DIAGNOSIS — D62 Acute posthemorrhagic anemia: Secondary | ICD-10-CM | POA: Diagnosis present

## 2023-01-14 DIAGNOSIS — K219 Gastro-esophageal reflux disease without esophagitis: Secondary | ICD-10-CM | POA: Diagnosis present

## 2023-01-14 DIAGNOSIS — D509 Iron deficiency anemia, unspecified: Secondary | ICD-10-CM | POA: Diagnosis present

## 2023-01-14 DIAGNOSIS — Z91018 Allergy to other foods: Secondary | ICD-10-CM | POA: Diagnosis not present

## 2023-01-14 DIAGNOSIS — Z860100 Personal history of colon polyps, unspecified: Secondary | ICD-10-CM | POA: Diagnosis not present

## 2023-01-14 DIAGNOSIS — K2961 Other gastritis with bleeding: Secondary | ICD-10-CM | POA: Diagnosis present

## 2023-01-14 DIAGNOSIS — Z91048 Other nonmedicinal substance allergy status: Secondary | ICD-10-CM | POA: Diagnosis not present

## 2023-01-14 DIAGNOSIS — Z95828 Presence of other vascular implants and grafts: Secondary | ICD-10-CM | POA: Diagnosis not present

## 2023-01-14 DIAGNOSIS — Z8507 Personal history of malignant neoplasm of pancreas: Secondary | ICD-10-CM | POA: Diagnosis not present

## 2023-01-14 DIAGNOSIS — T66XXXA Radiation sickness, unspecified, initial encounter: Secondary | ICD-10-CM | POA: Diagnosis present

## 2023-01-14 DIAGNOSIS — K2971 Gastritis, unspecified, with bleeding: Secondary | ICD-10-CM | POA: Diagnosis present

## 2023-01-14 DIAGNOSIS — Y842 Radiological procedure and radiotherapy as the cause of abnormal reaction of the patient, or of later complication, without mention of misadventure at the time of the procedure: Secondary | ICD-10-CM | POA: Diagnosis present

## 2023-01-14 DIAGNOSIS — K922 Gastrointestinal hemorrhage, unspecified: Secondary | ICD-10-CM | POA: Diagnosis not present

## 2023-01-14 DIAGNOSIS — F419 Anxiety disorder, unspecified: Secondary | ICD-10-CM | POA: Diagnosis present

## 2023-01-14 DIAGNOSIS — K31811 Angiodysplasia of stomach and duodenum with bleeding: Secondary | ICD-10-CM | POA: Diagnosis present

## 2023-01-14 DIAGNOSIS — Z981 Arthrodesis status: Secondary | ICD-10-CM | POA: Diagnosis not present

## 2023-01-14 DIAGNOSIS — I10 Essential (primary) hypertension: Secondary | ICD-10-CM | POA: Diagnosis present

## 2023-01-14 LAB — CBC
HCT: 22.6 % — ABNORMAL LOW (ref 36.0–46.0)
Hemoglobin: 7.1 g/dL — ABNORMAL LOW (ref 12.0–15.0)
MCH: 30.5 pg (ref 26.0–34.0)
MCHC: 31.4 g/dL (ref 30.0–36.0)
MCV: 97 fL (ref 80.0–100.0)
Platelets: 179 10*3/uL (ref 150–400)
RBC: 2.33 MIL/uL — ABNORMAL LOW (ref 3.87–5.11)
RDW: 16 % — ABNORMAL HIGH (ref 11.5–15.5)
WBC: 3.2 10*3/uL — ABNORMAL LOW (ref 4.0–10.5)
nRBC: 0 % (ref 0.0–0.2)

## 2023-01-14 LAB — PREPARE RBC (CROSSMATCH)

## 2023-01-14 LAB — LIPASE, BLOOD: Lipase: 19 U/L (ref 11–51)

## 2023-01-14 MED ORDER — SODIUM CHLORIDE 0.9% IV SOLUTION: Freq: Once | INTRAVENOUS | Status: AC

## 2023-01-14 MED ORDER — SODIUM CHLORIDE 0.9 % IV SOLN
INTRAVENOUS | Status: AC
Start: 2023-01-14 — End: 2023-01-15

## 2023-01-14 NOTE — Plan of Care (Signed)

## 2023-01-14 NOTE — Progress Notes (Signed)
PROGRESS NOTE  Rachelanne Bassano  GNF:[id number] DOB: [birth year] DOA: 01/12/2023 PCP: [name], DO   Brief Narrative: Lesle Campton is a 77 y.o. 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   Eliquis was initially held but she started taking it on Dec 11 Hemoglobin  of 8.3 on admission.Gastroenterology was consulted, underwent EGD with finding of hemorrhagic gastritis/GAVE.  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  Colonoscopy did not show any active bleeding.  She  was discharged to home with plan for continuing Prilosec, Carafate.  On presentation, hemoglobin in the range of 8. Started on protonix. S/P  EGD found to have hemorrhagic gastritis, treated with APC, bipolar cautery.  GAVE is also possibility.  GI recommended PPI twice daily for 4 weeks. Complains of some abdominal discomfort, nausea today.  Lipase level is normal.  Diet advanced to soft   Acute on chronic blood loss anemia: Iron studies showed optimal iron level.  Hemoglobin dropped to the range of 7.1 today.  Will give a unit of blood transfusion.   PE: On Eliquis.  Currently being held.   Pancreatic  adenocarcinoma: Status post chemoradiation.  Currently on monitoring  Follows with oncology. We recommend to follow-up with oncology as an outpatient.   Hypertension: On metoprolol at home,currently on hold   Anxiety/depression: On Wellbutrin, Celexa, BuSpar, Remeron at home  Weakness: Will consult physical therapy          DVT prophylaxis:SCDs Start: 01/12/23 1448     Code Status: Full Code  Family Communication: Called son [name] on 12/19,12/20.  Calls not received  Patient status:Inpatient  Patient is from :Home  Anticipated discharge QI:ONGE  Estimated DC date:tomorrow   Consultants: GI  Procedures:None yet  Antimicrobials:  Anti-infectives (From admission, onward)    None       Subjective: Patient seen and examined at bedside today.  Hemodynamically stable.  She does not feel well today.  She has some nausea.  Has not vomited.  Complains of some abdominal discomfort in the epigastric region.  We discussed about giving her a unit of blood transfusion today.  Objective: Vitals:   01/13/23 2036 01/14/23 0359 01/14/23 0914 01/14/23 0930  BP: 126/67 (!) 97/54 (!) 109/54 (!) 110/58  Pulse: 78 64 69 67  Resp: 18 18 18 16   Temp: 98.1 F (36.7 C) 98.4 F (36.9 C) 98.3 F (36.8 C) 98.6 F (37 C)  TempSrc: Oral Oral Oral Oral  SpO2: 99% 98% 97% 95%  Weight:      Height:        Intake/Output Summary (Last 24 hours) at 01/14/2023 1100 Last data filed at 01/14/2023 0300 Gross per 24 hour  Intake 400 ml  Output --  Net 400 ml  Filed Weights   01/12/23 1625  Weight: 59.2 kg    Examination:  General exam: Overall comfortable, not in distress,pleasant elderly female HEENT: PERRL Respiratory system:  no wheezes or crackles  Cardiovascular system: S1 & S2 heard, RRR.  Gastrointestinal system: Abdomen is nondistended, soft and nontender. Central nervous system: Alert and oriented Extremities: No edema, no clubbing ,no cyanosis Skin: No rashes,  no ulcers,no icterus     Data Reviewed: I have personally reviewed following labs and imaging studies  CBC: Recent Labs  Lab 01/11/23 1455 01/12/23 1216 01/13/23 0434 01/14/23 0415  WBC 4.7 4.1 2.8* 3.2*  NEUTROABS 2.6 2.9  --   --   HGB 8.8* 8.3* 7.3* 7.1*  HCT 27.4* 25.8* 23.1* 22.6*  MCV 97.5 96.3 97.1 97.0  PLT 243 190 194 179   Basic Metabolic Panel: Recent Labs  Lab 01/12/23 1216 01/13/23 0434  NA 135 138  K 4.2 3.7  CL 104 109  CO2 24 24  GLUCOSE 117* 83  BUN 33* 20  CREATININE 0.86 0.80  CALCIUM 8.8* 8.2*     No results found for this or any previous visit (from the past 240 hours).   Radiology Studies: CT ANGIO GI BLEED Result Date: 01/12/2023 CLINICAL DATA:  Abdominal pain, melena, history of pancreatic cancer * Tracking Code: BO * EXAM: CTA ABDOMEN AND PELVIS WITHOUT AND WITH CONTRAST TECHNIQUE: Multidetector CT imaging of the abdomen and pelvis was performed using the standard protocol during bolus administration of intravenous contrast. Multiplanar reconstructed images and MIPs were obtained and reviewed to evaluate the vascular anatomy. RADIATION DOSE REDUCTION: This exam was performed according to the departmental dose-optimization program which includes automated exposure control, adjustment of the mA and/or kV according to patient size and/or use of iterative reconstruction technique. CONTRAST:  75mL OMNIPAQUE IOHEXOL 350 MG/ML SOLN COMPARISON:  PET-CT, 10/28/2022, CT abdomen pelvis, 09/30/2022 FINDINGS: VASCULAR Normal contour and caliber of the abdominal aorta. No evidence of aneurysm, dissection, or other acute aortic pathology. Standard branching pattern of the abdominal aorta with solitary bilateral renal arteries. Moderate mixed calcific atherosclerosis. Review of the MIP images confirms the above findings. NON-VASCULAR Lower Chest: No acute findings.  Coronary artery calcifications. Hepatobiliary: No solid liver abnormality is seen. Unchanged  subcentimeter low-attenuation no gallstones, gallbladder wall thickening, or biliary dilatation. Pancreas: Unchanged post treatment appearance of the pancreatic head (series 15, image 25). Severe ductal dilatation and pancreatic atrophy distal to the pancreatic head, duct again measuring up to 1.1 cm in caliber (series 15, image 21). Spleen: Normal in size without significant abnormality. Adrenals/Urinary Tract: Adrenal glands are unremarkable. Numerous benign bilateral parapelvic renal cysts, for which no further follow-up or characterization is required. Kidneys are otherwise normal, without renal calculi, solid lesion, or hydronephrosis. Bladder is unremarkable. Stomach/Bowel: Thickening of the gastric antrum and pylorus (series 15, image 21). Status post appendectomy. No evidence of bowel wall thickening, distention, or inflammatory changes. Lymphatic: No enlarged abdominal or pelvic lymph nodes. Reproductive: No mass or other significant abnormality. Other: No abdominal wall hernia or abnormality. No ascites. Musculoskeletal: No acute osseous findings. IMPRESSION: 1. Thickening of the gastric antrum and pylorus, suggestive of gastritis; as on prior examinations this is possibly secondary to radiation therapy. 2. No intraluminal contrast extravasation or other findings to specifically localize reported GI bleeding. 3. Normal contour and caliber of the abdominal aorta. No evidence of aneurysm, dissection, or other acute aortic pathology. Moderate atherosclerosis. 4. Unchanged post treatment appearance of the pancreatic head. Severe ductal dilatation and  pancreatic atrophy distal to the pancreatic head. 5. Coronary artery disease. Aortic Atherosclerosis (ICD10-I70.0). Electronically Signed   By: Jearld Lesch M.D.   On: 01/12/2023 14:43    Scheduled Meds:  buPROPion  300 mg Oral QPC breakfast   busPIRone  20 mg Oral TID   Chlorhexidine Gluconate Cloth  6 each Topical Daily   escitalopram  20 mg Oral QHS    mirtazapine  15 mg Oral QHS   pantoprazole (PROTONIX) IV  40 mg Intravenous Q12H   sucralfate  1 g Oral TID WC & HS   Continuous Infusions:  sodium chloride       LOS: 0 days   Burnadette Pop, MD [facility] P12/20/2024, 11:00 AM

## 2023-01-14 NOTE — Telephone Encounter (Signed)
Patient originally scheduled with Kristen on 12/19 and was canceled due to her current hospitalization.  Please reschedule her hospital follow-up with [facility] in about 2-3 weeks.  She will need CBC in 1 week to recheck her hemoglobin due to anemia.  Brooke Bonito, MSN, APRN, FNP-BC, AGACNP-BC [facility]

## 2023-01-14 NOTE — Progress Notes (Cosign Needed Addendum)
Gastroenterology Progress Note   Referring Provider: No ref. provider found Primary Care Physician:  [name], DO Primary Gastroenterologist: Gerrit Friends.Rourk, MD   Patient ID: Sheryl Bender; [id number]; Jul 02, 1945    Subjective   Patient reports some mild epigastric pain this morning.  Denies any significant diarrhea but does feel the need to have a bowel movement this morning.  Seen maroon-colored stools prior to admission and a possible small spot of BRBPR earlier this week.  Currently denies any nausea or vomiting.  Seems to be tolerating liquid diet.  Continues to feel somewhat weak but denies any significant shortness of breath today.   Objective   Vital signs in last 24 hours Temp:  [98 F (36.7 C)-98.7 F (37.1 C)] 98.6 F (37 C) (12/20 0930) Pulse Rate:  [63-78] 67 (12/20 0930) Resp:  [14-21] 16 (12/20 0930) BP: (95-136)/(37-67) 110/58 (12/20 0930) SpO2:  [95 %-100 %] 95 % (12/20 0930) Last BM Date : 01/12/23  Physical Exam General:   Alert and oriented, pleasant Head:  Normocephalic and atraumatic. Eyes:  No icterus, sclera clear. Conjuctiva pink.  Mouth:  Without lesions, mucosa pink and moist.  Abdomen:  Bowel sounds present, soft, non-distended.  Mild TTP to epigastrium.  No HSM or hernias noted. No rebound or guarding. No masses appreciated  Neurologic:  Alert and  oriented x4;  grossly normal neurologically. Psych:  Alert and cooperative. Normal mood and affect.  Intake/Output from previous day: 12/19 0701 - 12/20 0700 In: 400 [I.V.:400] Out: -  Intake/Output this shift: No intake/output data recorded.  Lab Results  Recent Labs    01/12/23 1216 01/13/23 0434 01/14/23 0415  WBC 4.1 2.8* 3.2*  HGB 8.3* 7.3* 7.1*  HCT 25.8* 23.1* 22.6*  PLT 190 194 179   BMET Recent Labs    01/12/23 1216 01/13/23 0434  NA 135 138  K 4.2 3.7  CL 104 109  CO2 24 24  GLUCOSE 117* 83  BUN 33* 20  CREATININE 0.86 0.80  CALCIUM 8.8* 8.2*   LFT Recent  Labs    01/12/23 1216  PROT 5.9*  ALBUMIN 3.2*  AST 25  ALT 22  ALKPHOS 102  BILITOT 0.3   PT/INR No results for input(s): "LABPROT", "INR" in the last 72 hours. Hepatitis Panel No results for input(s): "HEPBSAG", "HCVAB", "HEPAIGM", "HEPBIGM" in the last 72 hours.  Studies/Results CT ANGIO GI BLEED Result Date: 01/12/2023 CLINICAL DATA:  Abdominal pain, melena, history of pancreatic cancer * Tracking Code: BO * EXAM: CTA ABDOMEN AND PELVIS WITHOUT AND WITH CONTRAST TECHNIQUE: Multidetector CT imaging of the abdomen and pelvis was performed using the standard protocol during bolus administration of intravenous contrast. Multiplanar reconstructed images and MIPs were obtained and reviewed to evaluate the vascular anatomy. RADIATION DOSE REDUCTION: This exam was performed according to the departmental dose-optimization program which includes automated exposure control, adjustment of the mA and/or kV according to patient size and/or use of iterative reconstruction technique. CONTRAST:  75mL OMNIPAQUE IOHEXOL 350 MG/ML SOLN COMPARISON:  PET-CT, 10/28/2022, CT abdomen pelvis, 09/30/2022 FINDINGS: VASCULAR Normal contour and caliber of the abdominal aorta. No evidence of aneurysm, dissection, or other acute aortic pathology. Standard branching pattern of the abdominal aorta with solitary bilateral renal arteries. Moderate mixed calcific atherosclerosis. Review of the MIP images confirms the above findings. NON-VASCULAR Lower Chest: No acute findings.  Coronary artery calcifications. Hepatobiliary: No solid liver abnormality is seen. Unchanged subcentimeter low-attenuation no gallstones, gallbladder wall thickening, or biliary dilatation. Pancreas: Unchanged post treatment  appearance of the pancreatic head (series 15, image 25). Severe ductal dilatation and pancreatic atrophy distal to the pancreatic head, duct again measuring up to 1.1 cm in caliber (series 15, image 21). Spleen: Normal in size without  significant abnormality. Adrenals/Urinary Tract: Adrenal glands are unremarkable. Numerous benign bilateral parapelvic renal cysts, for which no further follow-up or characterization is required. Kidneys are otherwise normal, without renal calculi, solid lesion, or hydronephrosis. Bladder is unremarkable. Stomach/Bowel: Thickening of the gastric antrum and pylorus (series 15, image 21). Status post appendectomy. No evidence of bowel wall thickening, distention, or inflammatory changes. Lymphatic: No enlarged abdominal or pelvic lymph nodes. Reproductive: No mass or other significant abnormality. Other: No abdominal wall hernia or abnormality. No ascites. Musculoskeletal: No acute osseous findings. IMPRESSION: 1. Thickening of the gastric antrum and pylorus, suggestive of gastritis; as on prior examinations this is possibly secondary to radiation therapy. 2. No intraluminal contrast extravasation or other findings to specifically localize reported GI bleeding. 3. Normal contour and caliber of the abdominal aorta. No evidence of aneurysm, dissection, or other acute aortic pathology. Moderate atherosclerosis. 4. Unchanged post treatment appearance of the pancreatic head. Severe ductal dilatation and pancreatic atrophy distal to the pancreatic head. 5. Coronary artery disease. Aortic Atherosclerosis (ICD10-I70.0). Electronically Signed   By: Jearld Lesch M.D.   On: 01/12/2023 14:43    Assessment  77 y.o. female with a history of stage I pancreatic cancer previously treated with radiation and chemotherapy now under continue observation, anxiety/depression, PE in September 2022 currently on Eliquis, chronic IDA, and recent admission for GI bleed earlier this month who presented with recurrent maroon-colored stools, weakness, and DOE.   Anemia/GI bleed: Prior admission earlier this month with EGD confirming bleeding dieulafoy lesion in the gastric antrum treated with gold probe and clipped.  She also underwent  colonoscopy with small polyp removed from the colon.  She represented with recurrence of melena/maroon-colored stool as well as drop in hemoglobin.  She has had steady decline in her hemoglobin since prior discharge.  Her Eliquis was held during her prior hospitalization and restarted on 12/11.  Hemoglobin 12/5 was 9.4 >> 8.9>> 8.8>> 8.3>> 7.3 yesterday.  Underwent repeat EGD yesterday which revealed gastritis with active hemorrhaging treated with APC therapy and bipolar cautery.  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  Will treat with PPI twice daily.  Transfuse/provide iron supplementation as needed.  She will need frequent monitoring of her H/H on discharge.  I again would recommend holding Eliquis again until at least 12/26, will discuss with Dr. Marletta Lor.  Recommend continued follow-up with oncology.  She continues to have significant IDA and recurrent GI bleeding, may need to have a risk versus benefits conversation in regards to ongoing anticoagulation.  This is a difficult case given she has a history of PE and is in chronic thromboembolic state given her cancer and prior evidence of nonocclusive clot in the SMV and right calf.  Her anticoagulation was resumed in November given these findings.   Plan / Recommendations  Advance to soft diet Likely plan to hold Eliquis for at least 1 week, will discuss further with Dr. Marletta Lor. PPI BID for at least 4 weeks, would recommend long-term daily PPI use. Trend H/H, transfuse for hemoglobin < 7 Outpatient follow-up in 2-3 weeks with [facility] (previously planned for 12/19)   Addendum: Discussed with Dr. Marletta Lor, he agrees with holding Eliquis for at least 1 week prior to resuming.   LOS: 0 days    01/14/2023,  11:07 AM   Brooke Bonito, MSN, FNP-BC, AGACNP-BC [facility]

## 2023-01-14 NOTE — TOC Initial Note (Signed)
Transition of Care [facility]) - Initial/Assessment Note    Patient Details  Name: Sheryl Bender MRN: [id number] Date of Birth: 05/11/1945  Transition of Care [facility]) CM/SW Contact:    Villa Herb, LCSWA Phone Number: 01/14/2023, 2:31 PM  Clinical Narrative:                 CSW updated that PT is recommending OP PT at D/C. CSW spoke with pt who states she has recently been discharged from [facility] in [place]. CSW spoke to [place] with OP PT office, pts referral faxed at their request. TOC to follow.   Expected Discharge Plan: OP Rehab Barriers to Discharge: Continued Medical Work up   Patient Goals and CMS Choice Patient states their goals for this hospitalization and ongoing recovery are:: return home CMS Medicare.gov Compare Post Acute Care list provided to:: Patient Choice offered to / list presented to : Patient      Expected Discharge Plan and Services In-house Referral: Clinical Social Work Discharge Planning Services: CM Consult   Living arrangements for the past 2 months: Apartment                                      Prior Living Arrangements/Services Living arrangements for the past 2 months: Apartment Lives with:: Self Patient language and need for interpreter reviewed:: Yes Do you feel safe going back to the place where you live?: Yes      Need for Family Participation in Patient Care: Yes (Comment) Care giver support system in place?: Yes (comment) Current home services: DME Criminal Activity/Legal Involvement Pertinent to Current Situation/Hospitalization: No - Comment as needed  Activities of Daily Living   ADL Screening (condition at time of admission) Independently performs ADLs?: Yes (appropriate for developmental age) Is the patient deaf or have difficulty hearing?: No Does the patient have difficulty seeing, even when wearing glasses/contacts?: No Does the patient have difficulty concentrating, remembering, or making decisions?:  No  Permission Sought/Granted                  Emotional Assessment Appearance:: Appears stated age Attitude/Demeanor/Rapport: Engaged Affect (typically observed): Accepting Orientation: : Oriented to Self, Oriented to Place, Oriented to Situation, Oriented to  Time Alcohol / Substance Use: Not Applicable Psych Involvement: No (comment)  Admission diagnosis:  Upper GI bleed [K92.2] Patient Active Problem List   Diagnosis Date Noted   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   Gastrointestinal hemorrhage 01/13/2023   Upper GI bleed 01/12/2023   Symptomatic anemia 12/27/2022   Intractable nausea and vomiting 05/06/2022   Adynamic ileus (HCC) 05/06/2022   Diarrhea 05/06/2022   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   Pancreatic cancer (HCC) 02/15/2022   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   Genetic testing 09/23/2021   Port-A-Cath in place 09/15/2021   Malnutrition of moderate degree 08/25/2021   Epigastric pain 08/24/2021   Nausea & vomiting 08/24/2021   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   Physical deconditioning    Malignant neoplasm of pancreas (HCC)    Iron deficiency anemia 07/15/2021   Essential hypertension 07/15/2021   Constipation    Pancreatitis, acute 07/13/2021   Hypokalemia 07/13/2021   GERD (gastroesophageal reflux disease) 07/13/2021   Depression 07/13/2021   Dilation of pancreatic duct 01/28/2021   Colon cancer screening 01/28/2021   Lumbar scoliosis 12/16/2015   PCP:  [name], DO Pharmacy:   [facility] [phone] [place], [place] - [phone] [place] [address] Phone: [phone] Fax: [phone]     Social Drivers of Health (SDOH) Social History: SDOH Screenings   Food Insecurity: No Food Insecurity (01/12/2023)  Housing: Low Risk  (01/12/2023)  Transportation Needs: No Transportation Needs (01/12/2023)  Utilities: Not At Risk (01/12/2023)  Tobacco Use: Low Risk  (01/12/2023)   SDOH  Interventions:     Readmission Risk Interventions    01/14/2023    2:29 PM 07/17/2021    1:22 PM  Readmission Risk Prevention Plan  Transportation Screening Complete Complete  Home Care Screening Complete Complete  Medication Review (RN CM) Complete Complete

## 2023-01-14 NOTE — Evaluation (Signed)
Physical Therapy Brief Evaluation and Discharge Note Patient Details Name: Sheryl Bender MRN: [id number] DOB: 09/15/1945 Today's Date: 01/14/2023   History of Present Illness  a 77 y.o. 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   Eliquis was initially held but she started taking it on Dec 11 Hemoglobin  of 8.3 on admission.Gastroenterology was consulted, underwent EGD with finding of hemorrhagic gastritis/GAVE.  Being transfused with a  unit of PRBC today  Clinical Impression  Patient tolerated evaluation well. Pt back and forth between PT evaluation but decided to proceed with evaluation after discussing any reports/thoughts of concerns. Pt showing adequate mobility is close to functional baseline. Does demonstrate mild balance deficits which can be addressed Outpatient. Pt has no skilled acute PT needs at this moment. No safety concerns noted throughout evaluation.  Patient is safe to DC home with appropriate services, DME, and any other needs indicated.  PT to sign off at this time.  Please reconsult acute PT services if functional changes occur while admitted.  Thank you.       PT Assessment    Assistance Needed at Discharge       Equipment Recommendations    Recommendations for Other Services       Precautions/Restrictions Restrictions Weight Bearing Restrictions Per Provider Order: No        Mobility  Bed Mobility          Transfers Overall transfer level: Independent                       Ambulation/Gait Ambulation/Gait assistance: Independent Gait Distance (Feet): 20 Feet   Gait Pattern/deviations: WFL(Within Functional Limits)   General Gait Details: unsteady with turning to L side.  Home Activity Instructions    Stairs            Modified Rankin (Stroke Patients Only)        Balance                          Pertinent Vitals/Pain   Pain Assessment Pain Assessment: Faces Faces Pain Scale: Hurts little more Pain Location: posterior shoulders Pain Intervention(s): Monitored during session     Home Living   Living Arrangements: Alone       Home Equipment: Rolling Walker (2 wheels);Shower seat        Prior Function        UE/LE Assessment               Communication   Communication Communication: No apparent difficulties     Cognition         General Comments      Exercises     Assessment/Plan    PT Problem List Decreased balance;Decreased activity tolerance       PT Visit Diagnosis      No Skilled PT     Co-evaluation                AMPAC 6 Clicks Help needed turning from your back to your side while in a flat bed without using  bedrails?: None Help needed moving from lying on your back to sitting on the side of a flat bed without using bedrails?: None Help needed moving to and from a bed to a chair (including a wheelchair)?: None Help needed standing up from a chair using your arms (e.g., wheelchair or bedside chair)?: None Help needed to walk in hospital room?: None Help needed climbing 3-5 steps with a railing? : None 6 Click Score: 24      End of Session   Activity Tolerance: No increased pain Patient left: in bed;with call bell/phone within reach;with bed alarm set Nurse Communication: Mobility status       Time: 3474-2595 PT Time Calculation (min) (ACUTE ONLY): 11 min  Charges:          Nelida Meuse PT, DPT [facility] [place] [address]  [phone] office  Nelida Meuse  01/14/2023, 12:50 PM

## 2023-01-15 DIAGNOSIS — Z7901 Long term (current) use of anticoagulants: Secondary | ICD-10-CM

## 2023-01-15 DIAGNOSIS — K922 Gastrointestinal hemorrhage, unspecified: Secondary | ICD-10-CM | POA: Diagnosis not present

## 2023-01-15 DIAGNOSIS — D62 Acute posthemorrhagic anemia: Secondary | ICD-10-CM

## 2023-01-15 LAB — TYPE AND SCREEN
ABO/RH(D): A POS
Antibody Screen: NEGATIVE
Unit division: 0

## 2023-01-15 LAB — CBC
HCT: 25.2 % — ABNORMAL LOW (ref 36.0–46.0)
Hemoglobin: 8.2 g/dL — ABNORMAL LOW (ref 12.0–15.0)
MCH: 31.1 pg (ref 26.0–34.0)
MCHC: 32.5 g/dL (ref 30.0–36.0)
MCV: 95.5 fL (ref 80.0–100.0)
Platelets: 198 10*3/uL (ref 150–400)
RBC: 2.64 MIL/uL — ABNORMAL LOW (ref 3.87–5.11)
RDW: 17.8 % — ABNORMAL HIGH (ref 11.5–15.5)
WBC: 4.3 10*3/uL (ref 4.0–10.5)
nRBC: 0 % (ref 0.0–0.2)

## 2023-01-15 LAB — BPAM RBC
Blood Product Expiration Date: 202412312359
ISSUE DATE / TIME: 202412200920
Unit Type and Rh: 6200

## 2023-01-15 MED ORDER — OMEPRAZOLE 40 MG PO CPDR: 40.0000 mg | DELAYED_RELEASE_CAPSULE | Freq: Two times a day (BID) | ORAL | 0 refills | Status: DC

## 2023-01-15 MED ORDER — APIXABAN 5 MG PO TABS: 5.0000 mg | ORAL_TABLET | Freq: Two times a day (BID) | ORAL | Status: DC

## 2023-01-15 NOTE — Discharge Summary (Signed)
Physician Discharge Summary  Sheryl Bender ZOX:[id number] DOB: 20-Mar-1945 DOA: 01/12/2023  PCP: [name], DO  Admit date: 01/12/2023 Discharge date: 01/15/2023  Admitted From: Home Disposition:  Home  Discharge Condition:Stable CODE STATUS:FULL Diet recommendation: Regular   Brief/Interim Summary: Sheryl Bender is a 77 y.o. 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   Eliquis was initially held but she started taking it on Dec 11 Hemoglobin  of 8.3 on admission.Gastroenterology was consulted, underwent EGD with finding of hemorrhagic gastritis/GAVE.  Given a unit of blood transfusion on 12/20.  Hemoglobin stable this morning.  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  Colonoscopy did not show any active bleeding.  She  was discharged to home with plan for continuing Prilosec, Carafate.  On presentation, hemoglobin in the range of 8. Started on iv protonix. S/P  EGD found to have hemorrhagic gastritis, treated with APC, bipolar cautery.  GAVE is also possibility.  GI recommended PPI twice daily for 4 weeks then daily indefinitely. She feels comfortable today.  Hemoglobin stable.  GI cleared for discharge and will arrange outpatient follow-up.   Acute on chronic  blood loss anemia: Iron studies showed optimal iron level.  Hemoglobin dropped to the range of 7.1 today.  Given a unit of blood transfusion on 12/20  PE: On Eliquis.  Currently being held.  Can resume after a week on 12/28   Pancreatic adenocarcinoma: Status post chemoradiation.  Currently on monitoring  Follows with oncology. We recommend to follow-up with oncology as an outpatient.   Hypertension: On metoprolol at home,currently on hold due to soft blood pressure   Anxiety/depression: On Wellbutrin, Celexa, BuSpar, Remeron at home   Weakness: PT recommend outpatient follow-up   Discharge Diagnoses:  Principal Problem:   Upper GI bleed Active Problems:   GAVE (gastric antral vascular ectasia)   Gastrointestinal hemorrhage   Current use of long term anticoagulation   ABLA (acute blood loss anemia)    Discharge Instructions  Discharge Instructions     Ambulatory referral to Physical Therapy   Complete by: As directed    Diet general   Complete by: As directed    Discharge instructions   Complete by: As directed    1)Please take prescribed medications as instructed 2)Resume Eliquis only on 12/28 3)Follow up with your PCP in a week and do a CBC test to check your hemoglobin  4)Follow up with outpatient gastroenterology   Increase activity slowly   Complete by: As directed       Allergies as of 01/15/2023       Reactions   Other Hives, Other (See Comments)   Cherry wood just cut- "smelled it and broke out"; cannot tolerate ANY cherry fragrances, either   Cherry Hives   Wound Dressing Adhesive Rash, Other (See Comments)   Band-Aids = local reaction  Medication List     STOP taking these medications    metoprolol succinate 25 MG 24 hr tablet Commonly known as: TOPROL-XL       TAKE these medications    acetaminophen 500 MG tablet Commonly known as: TYLENOL Take 2 tablets (1,000 mg total) by mouth every 8 (eight) hours as needed for moderate pain,  headache or fever. What changed: how much to take   apixaban 5 MG Tabs tablet Commonly known as: ELIQUIS Take 1 tablet (5 mg total) by mouth 2 (two) times daily. Resume taking only on 12/28 What changed: additional instructions   buPROPion 300 MG 24 hr tablet Commonly known as: WELLBUTRIN XL Take 300 mg by mouth daily after breakfast. Takes differently from prescribed: 20mg  BID   busPIRone 10 MG tablet Commonly known as: BUSPAR Take 20 mg by mouth 2 (two) times daily.   escitalopram 20 MG tablet Commonly known as: LEXAPRO Take 20 mg by mouth at bedtime.   lactose free nutrition Liqd Take 237 mLs by mouth 2 (two) times daily between meals.   lidocaine-prilocaine cream Commonly known as: EMLA Apply a small amount to port a cath site (do not rub in) and cover with plastic wrap 1 hour prior to infusion appointments   Linzess 72 MCG capsule Generic drug: linaclotide TAKE 1 CAPSULE BY MOUTH DAILY BEFORE BREAKFAST. What changed: See the new instructions.   mirtazapine 15 MG tablet Commonly known as: REMERON Take 7.5 mg by mouth at bedtime.   omeprazole 40 MG capsule Commonly known as: PRILOSEC Take 1 capsule (40 mg total) by mouth in the morning and at bedtime. Continue 2 times daily for 1 month then continue taking once daily indefinitely What changed:  when to take this additional instructions   QUEtiapine 25 MG tablet Commonly known as: SEROQUEL Take 25 mg by mouth 2 (two) times daily.   Vitamin D3 125 MCG (5000 UT) Caps Take 5,000 Units by mouth daily.        Follow-up Information     [name], DO. Schedule an appointment as soon as possible for a visit in 1 week(s).   Specialty: Family Medicine Contact information: [address] [place] [address] [phone]                Allergies  Allergen Reactions   Other Hives and Other (See Comments)    Cherry wood just cut- "smelled it and broke out"; cannot tolerate ANY cherry fragrances,  either      Cherry Hives   Wound Dressing Adhesive Rash and Other (See Comments)    Band-Aids = local reaction    Consultations: GI   Procedures/Studies: CT ANGIO GI BLEED Result Date: 01/12/2023 CLINICAL DATA:  Abdominal pain, melena, history of pancreatic cancer * Tracking Code: BO * EXAM: CTA ABDOMEN AND PELVIS WITHOUT AND WITH CONTRAST TECHNIQUE: Multidetector CT imaging of the abdomen and pelvis was performed using the standard protocol during bolus administration of intravenous contrast. Multiplanar reconstructed images and MIPs were obtained and reviewed to evaluate the vascular anatomy. RADIATION DOSE REDUCTION: This exam was performed according to the departmental dose-optimization program which includes automated exposure control, adjustment of the mA and/or kV according to patient size and/or use of iterative reconstruction technique. CONTRAST:  75mL OMNIPAQUE IOHEXOL 350 MG/ML SOLN COMPARISON:  PET-CT, 10/28/2022, CT abdomen pelvis, 09/30/2022 FINDINGS: VASCULAR Normal contour and caliber of the abdominal aorta. No evidence of aneurysm, dissection, or other acute aortic pathology. Standard branching pattern of the abdominal aorta with  solitary bilateral renal arteries. Moderate mixed calcific atherosclerosis. Review of the MIP images confirms the above findings. NON-VASCULAR Lower Chest: No acute findings.  Coronary artery calcifications. Hepatobiliary: No solid liver abnormality is seen. Unchanged subcentimeter low-attenuation no gallstones, gallbladder wall thickening, or biliary dilatation. Pancreas: Unchanged post treatment appearance of the pancreatic head (series 15, image 25). Severe ductal dilatation and pancreatic atrophy distal to the pancreatic head, duct again measuring up to 1.1 cm in caliber (series 15, image 21). Spleen: Normal in size without significant abnormality. Adrenals/Urinary Tract: Adrenal glands are unremarkable. Numerous benign bilateral parapelvic renal cysts,  for which no further follow-up or characterization is required. Kidneys are otherwise normal, without renal calculi, solid lesion, or hydronephrosis. Bladder is unremarkable. Stomach/Bowel: Thickening of the gastric antrum and pylorus (series 15, image 21). Status post appendectomy. No evidence of bowel wall thickening, distention, or inflammatory changes. Lymphatic: No enlarged abdominal or pelvic lymph nodes. Reproductive: No mass or other significant abnormality. Other: No abdominal wall hernia or abnormality. No ascites. Musculoskeletal: No acute osseous findings. IMPRESSION: 1. Thickening of the gastric antrum and pylorus, suggestive of gastritis; as on prior examinations this is possibly secondary to radiation therapy. 2. No intraluminal contrast extravasation or other findings to specifically localize reported GI bleeding. 3. Normal contour and caliber of the abdominal aorta. No evidence of aneurysm, dissection, or other acute aortic pathology. Moderate atherosclerosis. 4. Unchanged post treatment appearance of the pancreatic head. Severe ductal dilatation and pancreatic atrophy distal to the pancreatic head. 5. Coronary artery disease. Aortic Atherosclerosis (ICD10-I70.0). Electronically Signed   By: Jearld Lesch M.D.   On: 01/12/2023 14:43      Subjective: Patient seen and examined at bedside today.  Hemodynamically stable.  Overall comfortable, lying in bed.  Denies any abdomen pain, nausea or vomiting.  We discussed about discharge planning.Called her son Radine Hui several times since last few days, calls never received  Discharge Exam: Vitals:   01/14/23 2003 01/15/23 0342  BP: 117/64 118/61  Pulse: 78 71  Resp: 16 14  Temp: 97.9 F (36.6 C) 98.2 F (36.8 C)  SpO2: 98% 94%   Vitals:   01/14/23 1159 01/14/23 1425 01/14/23 2003 01/15/23 0342  BP: 102/75 94/71 117/64 118/61  Pulse: 76 74 78 71  Resp: 18 18 16 14   Temp: 98.8 F (37.1 C) 98.8 F (37.1 C) 97.9 F (36.6 C)  98.2 F (36.8 C)  TempSrc: Oral Oral Oral Oral  SpO2: 99% 98% 98% 94%  Weight:      Height:        General: Pt is alert, awake, not in acute distress Cardiovascular: RRR, S1/S2 +, no rubs, no gallops Respiratory: CTA bilaterally, no wheezing, no rhonchi Abdominal: Soft, NT, ND, bowel sounds + Extremities: no edema, no cyanosis    The results of significant diagnostics from this hospitalization (including imaging, microbiology, ancillary and laboratory) are listed below for reference.     Microbiology: No results found for this or any previous visit (from the past 240 hours).   Labs: BNP (last 3 results) No results for input(s): "BNP" in the last 8760 hours. Basic Metabolic Panel: Recent Labs  Lab 01/12/23 1216 01/13/23 0434  NA 135 138  K 4.2 3.7  CL 104 109  CO2 24 24  GLUCOSE 117* 83  BUN 33* 20  CREATININE 0.86 0.80  CALCIUM 8.8* 8.2*   Liver Function Tests: Recent Labs  Lab 01/12/23 1216  AST 25  ALT 22  ALKPHOS 102  BILITOT 0.3  PROT 5.9*  ALBUMIN 3.2*   Recent Labs  Lab 01/14/23 0415  LIPASE 19   No results for input(s): "AMMONIA" in the last 168 hours. CBC: Recent Labs  Lab 01/11/23 1455 01/12/23 1216 01/13/23 0434 01/14/23 0415 01/15/23 0552  WBC 4.7 4.1 2.8* 3.2* 4.3  NEUTROABS 2.6 2.9  --   --   --   HGB 8.8* 8.3* 7.3* 7.1* 8.2*  HCT 27.4* 25.8* 23.1* 22.6* 25.2*  MCV 97.5 96.3 97.1 97.0 95.5  PLT 243 190 194 179 198   Cardiac Enzymes: No results for input(s): "CKTOTAL", "CKMB", "CKMBINDEX", "TROPONINI" in the last 168 hours. BNP: Invalid input(s): "POCBNP" CBG: No results for input(s): "GLUCAP" in the last 168 hours. D-Dimer No results for input(s): "DDIMER" in the last 72 hours. Hgb A1c No results for input(s): "HGBA1C" in the last 72 hours. Lipid Profile No results for input(s): "CHOL", "HDL", "LDLCALC", "TRIG", "CHOLHDL", "LDLDIRECT" in the last 72 hours. Thyroid function studies No results for input(s): "TSH",  "T4TOTAL", "T3FREE", "THYROIDAB" in the last 72 hours.  Invalid input(s): "FREET3" Anemia work up Recent Labs    01/13/23 0434  TIBC 308  IRON 42   Urinalysis    Component Value Date/Time   COLORURINE YELLOW 05/06/2022 1225   APPEARANCEUR CLEAR 05/06/2022 1225   LABSPEC 1.017 05/06/2022 1225   PHURINE 5.0 05/06/2022 1225   GLUCOSEU NEGATIVE 05/06/2022 1225   HGBUR NEGATIVE 05/06/2022 1225   BILIRUBINUR NEGATIVE 05/06/2022 1225   KETONESUR NEGATIVE 05/06/2022 1225   PROTEINUR NEGATIVE 05/06/2022 1225   NITRITE NEGATIVE 05/06/2022 1225   LEUKOCYTESUR NEGATIVE 05/06/2022 1225   Sepsis Labs Recent Labs  Lab 01/12/23 1216 01/13/23 0434 01/14/23 0415 01/15/23 0552  WBC 4.1 2.8* 3.2* 4.3   Microbiology No results found for this or any previous visit (from the past 240 hours).  Please note: You were cared for by a hospitalist during your hospital stay. Once you are discharged, your primary care physician will handle any further medical issues. Please note that NO REFILLS for any discharge medications will be authorized once you are discharged, as it is imperative that you return to your primary care physician (or establish a relationship with a primary care physician if you do not have one) for your post hospital discharge needs so that they can reassess your need for medications and monitor your lab values.    Time coordinating discharge: 40 minutes  SIGNED:   Burnadette Pop, MD  [facility] 01/15/2023, 10:34 AM Pager [phone]  If 7PM-7AM, please contact night-coverage [email or web address] Password [email or web address]

## 2023-01-15 NOTE — TOC Progression Note (Signed)
Transition of Care [facility]) - Progression Note    Patient Details  Name: Sheryl Bender MRN: [id number] Date of Birth: 14-Sep-1945  [facility] of [facility]) CM/SW Contact  Catalina Gravel, [place] Phone Number: 01/15/2023, 11:12 AM  Clinical Narrative:     Pt ready for DC , secure chat to MD that pt accepted HHPT/OT and asked Dr. To add orders.  No further TOC needs.   Expected Discharge Plan: OP Rehab Barriers to Discharge: No Barriers Identified  Expected Discharge Plan and Services In-house Referral: Clinical Social Work Discharge Planning Services: CM Consult   Living arrangements for the past 2 months: Apartment Expected Discharge Date: 01/15/23                                     Social Determinants of Health (SDOH) Interventions SDOH Screenings   Food Insecurity: No Food Insecurity (01/12/2023)  Housing: Low Risk  (01/12/2023)  Transportation Needs: No Transportation Needs (01/12/2023)  Utilities: Not At Risk (01/12/2023)  Tobacco Use: Low Risk  (01/12/2023)    Readmission Risk Interventions    01/14/2023    2:29 PM 07/17/2021    1:22 PM  Readmission Risk Prevention Plan  Transportation Screening Complete Complete  Home Care Screening Complete Complete  Medication Review (RN CM) Complete Complete

## 2023-01-17 ENCOUNTER — Encounter (HOSPITAL_COMMUNITY): Payer: Self-pay | Admitting: Gastroenterology

## 2023-01-18 ENCOUNTER — Other Ambulatory Visit: Payer: Self-pay | Admitting: Gastroenterology

## 2023-01-18 NOTE — Anesthesia Postprocedure Evaluation (Signed)
Anesthesia Post Note  [name]  Procedure(s) Performed: ESOPHAGOGASTRODUODENOSCOPY (EGD) WITH PROPOFOL HOT HEMOSTASIS (ARGON PLASMA COAGULATION/BICAP)  Patient location during evaluation: Phase II Anesthesia Type: General Level of consciousness: awake Pain management: pain level controlled Vital Signs Assessment: post-procedure vital signs reviewed and stable Respiratory status: spontaneous breathing and respiratory function stable Cardiovascular status: blood pressure returned to baseline and stable Postop Assessment: no headache and no apparent nausea or vomiting Anesthetic complications: no Comments: Late entry   No notable events documented.   Last Vitals:  Vitals:   01/14/23 2003 01/15/23 0342  BP: 117/64 118/61  Pulse: 78 71  Resp: 16 14  Temp: 36.6 C 36.8 C  SpO2: 98% 94%    Last Pain:  Vitals:   01/15/23 1052  TempSrc:   PainSc: 0-No pain                 Windell Norfolk

## 2023-01-20 ENCOUNTER — Encounter: Payer: Self-pay | Admitting: *Deleted

## 2023-01-20 NOTE — Telephone Encounter (Signed)
LMOM for pt to call office. Also sent a MyChart message.

## 2023-01-24 ENCOUNTER — Other Ambulatory Visit: Payer: Self-pay

## 2023-01-24 ENCOUNTER — Other Ambulatory Visit: Payer: Self-pay | Admitting: *Deleted

## 2023-01-24 DIAGNOSIS — K922 Gastrointestinal hemorrhage, unspecified: Secondary | ICD-10-CM

## 2023-01-24 NOTE — Telephone Encounter (Signed)
Pt is going to [facility] in [place] to have labs done.

## 2023-01-24 NOTE — Telephone Encounter (Signed)
Patient called to advise that she is getting weaker since leaving the hospital.  Stools were normal until this morning.  States that they are a rust color and she is not feeling well.  I advised that she notify Dr. Luvenia Starch office, number provided and made her aware that she may have to return to the ER.  Verbalized understanding.

## 2023-01-24 NOTE — Telephone Encounter (Signed)
Pt was made aware and lab was ordered. Advised pt to return to ER if not feeling well before scheduled appt.

## 2023-01-25 LAB — CBC WITH DIFFERENTIAL/PLATELET
Basophils Absolute: 0 10*3/uL (ref 0.0–0.2)
Basos: 0 %
EOS (ABSOLUTE): 0 10*3/uL (ref 0.0–0.4)
Eos: 1 %
Hematocrit: 28.3 % — ABNORMAL LOW (ref 34.0–46.6)
Hemoglobin: 9.1 g/dL — ABNORMAL LOW (ref 11.1–15.9)
Immature Grans (Abs): 0 10*3/uL (ref 0.0–0.1)
Immature Granulocytes: 0 %
Lymphocytes Absolute: 0.7 10*3/uL (ref 0.7–3.1)
Lymphs: 19 %
MCH: 30.3 pg (ref 26.6–33.0)
MCHC: 32.2 g/dL (ref 31.5–35.7)
MCV: 94 fL (ref 79–97)
Monocytes Absolute: 0.5 10*3/uL (ref 0.1–0.9)
Monocytes: 12 %
Neutrophils Absolute: 2.5 10*3/uL (ref 1.4–7.0)
Neutrophils: 68 %
Platelets: 203 10*3/uL (ref 150–450)
RBC: 3 x10E6/uL — ABNORMAL LOW (ref 3.77–5.28)
RDW: 15.9 % — ABNORMAL HIGH (ref 11.7–15.4)
WBC: 3.7 10*3/uL (ref 3.4–10.8)

## 2023-01-27 ENCOUNTER — Ambulatory Visit: Payer: Medicare Other | Admitting: Gastroenterology

## 2023-01-29 ENCOUNTER — Encounter (HOSPITAL_COMMUNITY): Payer: Self-pay | Admitting: *Deleted

## 2023-01-29 ENCOUNTER — Inpatient Hospital Stay (HOSPITAL_COMMUNITY)
Admission: EM | Admit: 2023-01-29 | Discharge: 2023-02-02 | DRG: 377 | Disposition: A | Discharge status: Home / Self Care | Payer: Medicare Other | Attending: Internal Medicine | Admitting: Internal Medicine

## 2023-01-29 ENCOUNTER — Emergency Department (HOSPITAL_COMMUNITY): Payer: Medicare Other

## 2023-01-29 ENCOUNTER — Other Ambulatory Visit: Payer: Self-pay

## 2023-01-29 DIAGNOSIS — F419 Anxiety disorder, unspecified: Secondary | ICD-10-CM | POA: Diagnosis present

## 2023-01-29 DIAGNOSIS — I8290 Acute embolism and thrombosis of unspecified vein: Secondary | ICD-10-CM

## 2023-01-29 DIAGNOSIS — Z7901 Long term (current) use of anticoagulants: Secondary | ICD-10-CM | POA: Diagnosis not present

## 2023-01-29 DIAGNOSIS — Z86711 Personal history of pulmonary embolism: Secondary | ICD-10-CM

## 2023-01-29 DIAGNOSIS — K259 Gastric ulcer, unspecified as acute or chronic, without hemorrhage or perforation: Secondary | ICD-10-CM | POA: Diagnosis present

## 2023-01-29 DIAGNOSIS — K922 Gastrointestinal hemorrhage, unspecified: Secondary | ICD-10-CM | POA: Diagnosis present

## 2023-01-29 DIAGNOSIS — Z981 Arthrodesis status: Secondary | ICD-10-CM | POA: Diagnosis not present

## 2023-01-29 DIAGNOSIS — D62 Acute posthemorrhagic anemia: Secondary | ICD-10-CM | POA: Diagnosis present

## 2023-01-29 DIAGNOSIS — K2971 Gastritis, unspecified, with bleeding: Principal | ICD-10-CM | POA: Diagnosis present

## 2023-01-29 DIAGNOSIS — F32A Depression, unspecified: Secondary | ICD-10-CM | POA: Diagnosis present

## 2023-01-29 DIAGNOSIS — K2980 Duodenitis without bleeding: Secondary | ICD-10-CM | POA: Diagnosis present

## 2023-01-29 DIAGNOSIS — I81 Portal vein thrombosis: Secondary | ICD-10-CM | POA: Diagnosis present

## 2023-01-29 DIAGNOSIS — K861 Other chronic pancreatitis: Secondary | ICD-10-CM | POA: Diagnosis present

## 2023-01-29 DIAGNOSIS — Z01810 Encounter for preprocedural cardiovascular examination: Secondary | ICD-10-CM | POA: Diagnosis not present

## 2023-01-29 DIAGNOSIS — Z9221 Personal history of antineoplastic chemotherapy: Secondary | ICD-10-CM | POA: Diagnosis not present

## 2023-01-29 DIAGNOSIS — K219 Gastro-esophageal reflux disease without esophagitis: Secondary | ICD-10-CM | POA: Diagnosis present

## 2023-01-29 DIAGNOSIS — Z79899 Other long term (current) drug therapy: Secondary | ICD-10-CM | POA: Diagnosis not present

## 2023-01-29 DIAGNOSIS — C259 Malignant neoplasm of pancreas, unspecified: Secondary | ICD-10-CM | POA: Diagnosis present

## 2023-01-29 DIAGNOSIS — K921 Melena: Secondary | ICD-10-CM | POA: Diagnosis not present

## 2023-01-29 DIAGNOSIS — K55069 Acute infarction of intestine, part and extent unspecified: Secondary | ICD-10-CM

## 2023-01-29 DIAGNOSIS — D509 Iron deficiency anemia, unspecified: Secondary | ICD-10-CM | POA: Diagnosis present

## 2023-01-29 DIAGNOSIS — Z923 Personal history of irradiation: Secondary | ICD-10-CM

## 2023-01-29 DIAGNOSIS — K2901 Acute gastritis with bleeding: Secondary | ICD-10-CM | POA: Diagnosis not present

## 2023-01-29 DIAGNOSIS — K2951 Unspecified chronic gastritis with bleeding: Secondary | ICD-10-CM | POA: Diagnosis not present

## 2023-01-29 DIAGNOSIS — I10 Essential (primary) hypertension: Secondary | ICD-10-CM | POA: Diagnosis present

## 2023-01-29 LAB — CBC WITH DIFFERENTIAL/PLATELET
Abs Immature Granulocytes: 0.01 10*3/uL (ref 0.00–0.07)
Basophils Absolute: 0 10*3/uL (ref 0.0–0.1)
Basophils Relative: 0 %
Eosinophils Absolute: 0 10*3/uL (ref 0.0–0.5)
Eosinophils Relative: 1 %
HCT: 26.7 % — ABNORMAL LOW (ref 36.0–46.0)
Hemoglobin: 8.4 g/dL — ABNORMAL LOW (ref 12.0–15.0)
Immature Granulocytes: 0 %
Lymphocytes Relative: 14 %
Lymphs Abs: 0.5 10*3/uL — ABNORMAL LOW (ref 0.7–4.0)
MCH: 30 pg (ref 26.0–34.0)
MCHC: 31.5 g/dL (ref 30.0–36.0)
MCV: 95.4 fL (ref 80.0–100.0)
Monocytes Absolute: 0.4 10*3/uL (ref 0.1–1.0)
Monocytes Relative: 13 %
Neutro Abs: 2.4 10*3/uL (ref 1.7–7.7)
Neutrophils Relative %: 72 %
Platelets: 200 10*3/uL (ref 150–400)
RBC: 2.8 MIL/uL — ABNORMAL LOW (ref 3.87–5.11)
RDW: 16.8 % — ABNORMAL HIGH (ref 11.5–15.5)
WBC: 3.3 10*3/uL — ABNORMAL LOW (ref 4.0–10.5)
nRBC: 0 % (ref 0.0–0.2)

## 2023-01-29 LAB — LIPASE, BLOOD: Lipase: 24 U/L (ref 11–51)

## 2023-01-29 LAB — COMPREHENSIVE METABOLIC PANEL
ALT: 65 U/L — ABNORMAL HIGH (ref 0–44)
AST: 47 U/L — ABNORMAL HIGH (ref 15–41)
Albumin: 3.2 g/dL — ABNORMAL LOW (ref 3.5–5.0)
Alkaline Phosphatase: 110 U/L (ref 38–126)
Anion gap: 5 (ref 5–15)
BUN: 30 mg/dL — ABNORMAL HIGH (ref 8–23)
CO2: 25 mmol/L (ref 22–32)
Calcium: 8.8 mg/dL — ABNORMAL LOW (ref 8.9–10.3)
Chloride: 108 mmol/L (ref 98–111)
Creatinine, Ser: 0.73 mg/dL (ref 0.44–1.00)
GFR, Estimated: >60 mL/min (ref >60)
Glucose, Bld: 131 mg/dL — ABNORMAL HIGH (ref 70–99)
Potassium: 3.9 mmol/L (ref 3.5–5.1)
Sodium: 138 mmol/L (ref 135–145)
Total Bilirubin: 0.4 mg/dL (ref 0.0–1.2)
Total Protein: 6.1 g/dL — ABNORMAL LOW (ref 6.5–8.1)

## 2023-01-29 LAB — PROTIME-INR
INR: 0.9 (ref 0.8–1.2)
Prothrombin Time: 12.5 s (ref 11.4–15.2)

## 2023-01-29 LAB — HEMOGLOBIN AND HEMATOCRIT, BLOOD
HCT: 25.9 % — ABNORMAL LOW (ref 36.0–46.0)
Hemoglobin: 8.1 g/dL — ABNORMAL LOW (ref 12.0–15.0)

## 2023-01-29 MED ORDER — ACETAMINOPHEN 650 MG RE SUPP
650.0000 mg | Freq: Four times a day (QID) | RECTAL | Status: DC | PRN
Start: 2023-01-29 — End: 2023-02-02

## 2023-01-29 MED ORDER — BUSPIRONE HCL 10 MG PO TABS
20.0000 mg | ORAL_TABLET | Freq: Two times a day (BID) | ORAL | Status: DC
  Administered 2023-01-29 – 2023-02-02 (×8): 20 mg via ORAL
  Filled 2023-01-29 (×2): qty 4
  Filled 2023-01-29 (×4): qty 2
  Filled 2023-01-29 (×2): qty 4

## 2023-01-29 MED ORDER — ACETAMINOPHEN 325 MG PO TABS
650.0000 mg | ORAL_TABLET | Freq: Four times a day (QID) | ORAL | Status: DC | PRN
  Administered 2023-01-30: 650 mg via ORAL
  Filled 2023-01-29: qty 2

## 2023-01-29 MED ORDER — ESCITALOPRAM OXALATE 10 MG PO TABS
20.0000 mg | ORAL_TABLET | Freq: Every day | ORAL | Status: DC
  Administered 2023-01-29 – 2023-02-01 (×4): 20 mg via ORAL
  Filled 2023-01-29 (×4): qty 2

## 2023-01-29 MED ORDER — PANTOPRAZOLE SODIUM 40 MG IV SOLR
40.0000 mg | Freq: Two times a day (BID) | INTRAVENOUS | Status: DC
  Administered 2023-01-30 – 2023-02-01 (×5): 40 mg via INTRAVENOUS
  Filled 2023-01-29 (×5): qty 10

## 2023-01-29 MED ORDER — PANTOPRAZOLE SODIUM 40 MG IV SOLR: 40.0000 mg | Freq: Once | INTRAVENOUS | Status: DC

## 2023-01-29 MED ORDER — PANTOPRAZOLE SODIUM 40 MG IV SOLR
40.0000 mg | Freq: Once | INTRAVENOUS | Status: AC
Start: 2023-01-29 — End: 2023-01-29
  Administered 2023-01-29: 40 mg via INTRAVENOUS
  Filled 2023-01-29: qty 10

## 2023-01-29 MED ORDER — BUPROPION HCL ER (XL) 150 MG PO TB24
300.0000 mg | ORAL_TABLET | Freq: Every day | ORAL | Status: DC
  Administered 2023-01-30 – 2023-02-02 (×4): 300 mg via ORAL
  Filled 2023-01-29: qty 2
  Filled 2023-01-29: qty 1
  Filled 2023-01-29: qty 2
  Filled 2023-01-29: qty 1

## 2023-01-29 MED ORDER — POLYETHYLENE GLYCOL 3350 17 G PO PACK: 17.0000 g | PACK | Freq: Every day | ORAL | Status: DC | PRN

## 2023-01-29 MED ORDER — ONDANSETRON HCL 4 MG PO TABS: 4.0000 mg | ORAL_TABLET | Freq: Four times a day (QID) | ORAL | Status: DC | PRN

## 2023-01-29 MED ORDER — ONDANSETRON HCL 4 MG/2ML IJ SOLN
4.0000 mg | Freq: Once | INTRAMUSCULAR | Status: AC
Start: 2023-01-29 — End: 2023-01-29
  Administered 2023-01-29: 4 mg via INTRAVENOUS
  Filled 2023-01-29: qty 2

## 2023-01-29 MED ORDER — DEXTROSE-SODIUM CHLORIDE 5-0.9 % IV SOLN: INTRAVENOUS | Status: AC

## 2023-01-29 MED ORDER — ONDANSETRON HCL 4 MG/2ML IJ SOLN: 4.0000 mg | Freq: Four times a day (QID) | INTRAMUSCULAR | Status: DC | PRN

## 2023-01-29 MED ORDER — IOHEXOL 300 MG/ML  SOLN
100.0000 mL | Freq: Once | INTRAMUSCULAR | Status: AC | PRN
  Administered 2023-01-29: 100 mL via INTRAVENOUS

## 2023-01-29 NOTE — H&P (Signed)
 History and Physical    Sheryl Bender FMW:[id number] DOB: Mar 26, 1945 DOA: 01/29/2023  PCP: Lonna Millman, DO   Patient coming from: Home  I have personally briefly reviewed patient's old medical records in [facility]  Chief Complaint: Dark stools  HPI: Sheryl Bender is a 78 y.o. female with medical history significant for GI bleed, pancreatic cancer, interstitial pancreatitis, hypertension, pulmonary embolism 2022 on chronic anticoagulation. Patient presented to the ED with complaints of bloody stools that started about 2 to 3 days ago.  Recent hospitalizations 12/2 to 12/5 and again 12/18 to 12/21 for GI bleed. During recent hospitalization, patient underwent EGD 5/19-with findings of gastritis with active hemorrhaging treated with APC therapy and bipolar cautery. 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  She was to hold her anticoagulation with Eliquis  and resume 12/28.  On discharge hemoglobin was 8.2.  Patient reports on getting home she had some normal colored stools, reports  dark and red stools, re-occurred about 2 to 3 days ago.  Reports mild upper abdominal pain, no vomiting.  Denies NSAID use.  She decided to hold off on not starting her anticoagulation 12/28.    ED Course: Blood pressure 112-128.  Heart rate 76-90.  Hemoglobin 8.4.  CT abdomen and pelvis with contrast-question of the nonocclusive thrombus in the SMV but most significant thrombus not seen in the portal vein including the main portal vein. low-density masslike area along the uncinate process with associated severe ductal dilatation in caliber change. This has consistent with patient's history of pancreatic neoplasm.  Progressive wall thickening along the distal stomach- co-relate for PUD. EDP talked to GI Dr. Cindie, will see in consult in a.m. for possible EGD, may need to involve IR.  Review of Systems: As per HPI all other systems reviewed and negative.  Past Medical History:   Diagnosis Date   Anxiety    Arthritis    Depression    Dysrhythmia    hx palpitations greater than 5 yrs -neg echo, stress per pt ? where or dr   GERD (gastroesophageal reflux disease)    occ   Nausea & vomiting 08/24/2021   Pancreatic adenocarcinoma (HCC)    Pancreatic pseudocyst    Port-A-Cath in place 09/15/2021   Pulmonary embolism [facility])    September 2022    Past Surgical History:  Procedure Laterality Date   ANTERIOR LAT LUMBAR FUSION Right 12/16/2015   Procedure: RIGHT LUMBAR TWO-THREE, LUMBAR THREE-FOUR, LUMBAR FOUR-FIVE ANTEROLATERAL LUMBAR INTERBODY FUSION;  Surgeon: Fairy Levels, MD;  Location: [facility] OR;  Service: Neurosurgery;  Laterality: Right;   BALLOON DILATION N/A 08/27/2021   Procedure: BALLOON DILATION;  Surgeon: Wilhelmenia Aloha Raddle., MD;  Location: [facility];  Service: Gastroenterology;  Laterality: N/A;   BIOPSY  07/09/2021   Procedure: BIOPSY;  Surgeon: Wilhelmenia Aloha Raddle., MD;  Location: [facility] ENDOSCOPY;  Service: Gastroenterology;;   COLONOSCOPY WITH PROPOFOL  N/A 12/29/2022   Procedure: COLONOSCOPY WITH PROPOFOL ;  Surgeon: Shaaron Lamar HERO, MD;  Location: [facility];  Service: Endoscopy;  Laterality: N/A;   CYST GASTROSTOMY  08/27/2021   Procedure: CYST GASTROSTOMY;  Surgeon: Wilhelmenia Aloha Raddle., MD;  Location: [facility];  Service: Gastroenterology;;   ESOPHAGOGASTRODUODENOSCOPY N/A 07/09/2021   Procedure: ESOPHAGOGASTRODUODENOSCOPY (EGD);  Surgeon: Wilhelmenia Aloha Raddle., MD;  Location: [facility] ENDOSCOPY;  Service: Gastroenterology;  Laterality: N/A;   ESOPHAGOGASTRODUODENOSCOPY (EGD) WITH PROPOFOL  N/A 08/27/2021   Procedure: ESOPHAGOGASTRODUODENOSCOPY (EGD) WITH PROPOFOL ;  Surgeon: Wilhelmenia Aloha Raddle., MD;  Location: [facility];  Service: Gastroenterology;  Laterality: N/A;   ESOPHAGOGASTRODUODENOSCOPY (EGD) WITH PROPOFOL  N/A 10/08/2021   Procedure: ESOPHAGOGASTRODUODENOSCOPY (EGD) WITH PROPOFOL ;  Surgeon: Wilhelmenia Aloha Raddle., MD;  Location: [facility];  Service: Gastroenterology;  Laterality: N/A;   ESOPHAGOGASTRODUODENOSCOPY (EGD) WITH PROPOFOL  N/A 12/29/2022   Procedure: ESOPHAGOGASTRODUODENOSCOPY (EGD) WITH PROPOFOL ;  Surgeon: Shaaron Lamar HERO, MD;  [facility]: [facility];  Service: Endoscopy;  Laterality: N/A;   ESOPHAGOGASTRODUODENOSCOPY (EGD) WITH PROPOFOL  N/A 01/13/2023   Procedure: ESOPHAGOGASTRODUODENOSCOPY (EGD) WITH PROPOFOL ;  Surgeon: Cinderella Deatrice FALCON, MD;  Location: [facility];  Service: Endoscopy;  Laterality: N/A;   EUS N/A 07/09/2021   Procedure: UPPER ENDOSCOPIC ULTRASOUND (EUS) RADIAL;  Surgeon: Wilhelmenia Aloha Raddle., MD;  Location: [facility] ENDOSCOPY;  Service: Gastroenterology;  Laterality: N/A;   EUS N/A 08/27/2021   Procedure: UPPER ENDOSCOPIC ULTRASOUND (EUS) LINEAR;  Surgeon: Wilhelmenia Aloha Raddle., MD;  Location: [facility];  Service: Gastroenterology;  Laterality: N/A;   FINE NEEDLE ASPIRATION  07/09/2021   Procedure: FINE NEEDLE ASPIRATION (FNA) LINEAR;  Surgeon: Wilhelmenia Aloha Raddle., MD;  Location: [facility] ENDOSCOPY;  Service: Gastroenterology;;   HEMOSTASIS CLIP PLACEMENT  12/29/2022   Procedure: HEMOSTASIS CLIP PLACEMENT;  Surgeon: Shaaron Lamar HERO, MD;  Location: [facility];  Service: Endoscopy;;   HOT HEMOSTASIS  12/29/2022   Procedure: HOT HEMOSTASIS (ARGON PLASMA COAGULATION/BICAP);  Surgeon: Shaaron Lamar HERO, MD;  [facility]: [facility];  Service: Endoscopy;;   HOT HEMOSTASIS  01/13/2023   Procedure: HOT HEMOSTASIS (ARGON PLASMA COAGULATION/BICAP);  Surgeon: Cinderella Deatrice FALCON, MD;  Location: [facility];  Service: Endoscopy;;  used apc and goldprobe   IR IMAGING GUIDED PORT INSERTION  09/14/2021   LAPAROSCOPY N/A 02/15/2022   Procedure: STAGING DIAGNOSTIC;  Surgeon: Dasie Leonor CROME, MD;  Location: [facility] OR;  Service: General;  Laterality: N/A;   LUMBAR PERCUTANEOUS PEDICLE SCREW 3 LEVEL Bilateral 12/16/2015   Procedure: PERCUTANEOUS PEDICLE SCREWS BILATERALLY AT LUMBAR TWO-FIVE;  Surgeon: Fairy Levels,  MD;  Location: [facility] OR;  Service: Neurosurgery;  Laterality: Bilateral;   PANCREATIC STENT PLACEMENT  08/27/2021   Procedure: PANCREATIC STENT PLACEMENT;  Surgeon: Wilhelmenia Aloha Raddle., MD;  Location: [facility];  Service: Gastroenterology;;   POLYPECTOMY  12/29/2022   Procedure: POLYPECTOMY INTESTINAL;  Surgeon: Shaaron Lamar HERO, MD;  [facility];  Service: Endoscopy;;   STENT REMOVAL  10/08/2021   Procedure: STENT REMOVAL;  Surgeon: Wilhelmenia Aloha Raddle., MD;  Location: [facility];  Service: Gastroenterology;;   TUBAL LIGATION  1974   WHIPPLE PROCEDURE N/A 02/15/2022   Procedure: ATTEMPTED WHIPPLE PROCEDURE;  Surgeon: Dasie Leonor CROME, MD;  Location: [facility] OR;  Service: General;  Laterality: N/A;     reports that she has never smoked. She has never used smokeless tobacco. She reports that she does not drink alcohol  and does not use drugs.  Allergies  Allergen Reactions   Other Hives and Other (See Comments)    Cherry wood just cut- smelled it and broke out; cannot tolerate ANY cherry fragrances, either      Cherry Hives   Wound Dressing Adhesive Rash and Other (See Comments)    Band-Aids = local reaction    Family History  Problem Relation Age of Onset   Pancreatitis Brother        alcoholic pancreatitis   Pancreatic cancer Neg Hx    Colon cancer Neg Hx     Prior to Admission medications   Medication Sig Start Date End Date Taking? Authorizing Provider  acetaminophen  (TYLENOL ) 500 MG tablet Take 2 tablets (  1,000 mg total) by mouth every 8 (eight) hours as needed for moderate pain, headache or fever. Patient taking differently: Take 750-1,000 mg by mouth every 8 (eight) hours as needed for moderate pain (pain score 4-6), headache or fever. 07/21/21   Ricky Fines, MD  apixaban  (ELIQUIS ) 5 MG TABS tablet Take 1 tablet (5 mg total) by mouth 2 (two) times daily. Resume taking only on 12/28 01/15/23 01/10/24  Jillian Buttery, MD  buPROPion  (WELLBUTRIN  XL) 300 MG 24 hr  tablet Take 300 mg by mouth daily after breakfast. Takes differently from prescribed: 20mg  BID 06/15/21   [provider]  busPIRone  (BUSPAR ) 10 MG tablet Take 20 mg by mouth 2 (two) times daily. 01/27/21   [provider]  Cholecalciferol  (VITAMIN D3) 125 MCG (5000 UT) CAPS Take 5,000 Units by mouth daily.    [provider]  escitalopram  (LEXAPRO ) 20 MG tablet Take 20 mg by mouth at bedtime.    [provider]  lactose free nutrition (BOOST) LIQD Take 237 mLs by mouth 2 (two) times daily between meals.    [provider]  lidocaine -prilocaine  (EMLA ) cream Apply a small amount to port a cath site (do not rub in) and cover with plastic wrap 1 hour prior to infusion appointments 09/15/21   Rogers Hai, MD  LINZESS  72 MCG capsule TAKE 1 CAPSULE BY MOUTH DAILY BEFORE BREAKFAST 01/20/23   Mansouraty, Aloha Raddle., MD  mirtazapine  (REMERON ) 15 MG tablet Take 7.5 mg by mouth at bedtime.    [provider]  omeprazole  (PRILOSEC) 40 MG capsule Take 1 capsule (40 mg total) by mouth in the morning and at bedtime. Continue 2 times daily for 1 month then continue taking once daily indefinitely 01/15/23   Jillian Buttery, MD  QUEtiapine  (SEROQUEL ) 25 MG tablet Take 25 mg by mouth 2 (two) times daily. 12/14/22   [provider]    Physical Exam: Vitals:   01/29/23 1059 01/29/23 1200 01/29/23 1245 01/29/23 1300  BP: 112/62 127/62 117/66 128/65  Pulse: 90 77 79 76  Resp: 14 15 15    Temp: 98.1 F (36.7 C)     TempSrc: Oral     SpO2: 96% 97% 98% 99%  Weight:      Height:        Constitutional: NAD, calm, comfortable Vitals:   01/29/23 1059 01/29/23 1200 01/29/23 1245 01/29/23 1300  BP: 112/62 127/62 117/66 128/65  Pulse: 90 77 79 76  Resp: 14 15 15    Temp: 98.1 F (36.7 C)     TempSrc: Oral     SpO2: 96% 97% 98% 99%  Weight:      Height:       Eyes: PERRL, lids and conjunctivae normal ENMT: Mucous membranes are moist.   Neck:  normal, supple, no masses, no thyromegaly Respiratory: clear to auscultation bilaterally, no wheezing, no crackles. Normal respiratory effort. No accessory muscle use.  Cardiovascular: Regular rate and rhythm, no murmurs / rubs / gallops. No extremity edema. 2+ pedal pulses. No carotid bruits.  Abdomen: no tenderness, no masses palpated. No hepatosplenomegaly. Bowel sounds positive.  Musculoskeletal: no clubbing / cyanosis. No joint deformity upper and lower extremities.  Skin: no rashes, lesions, ulcers. No induration Neurologic: No facial asymmetry, moving extremities spontaneously, speech fluent. Psychiatric: Normal judgment and insight. Alert and oriented x 3. Normal mood.   Labs on Admission: I have personally reviewed following labs and imaging studies  CBC: Recent Labs  Lab 01/24/23 1114 01/29/23 1137  WBC 3.7  3.3*  NEUTROABS 2.5 2.4  HGB 9.1* 8.4*  HCT 28.3* 26.7*  MCV 94 95.4  PLT 203 200   Basic Metabolic Panel: Recent Labs  Lab 01/29/23 1137  NA 138  K 3.9  CL 108  CO2 25  GLUCOSE 131*  BUN 30*  CREATININE 0.73  CALCIUM  8.8*   GFR: Estimated Creatinine Clearance: 46.2 mL/min (by C-G formula based on SCr of 0.73 mg/dL). Liver Function Tests: Recent Labs  Lab 01/29/23 1137  AST 47*  ALT 65*  ALKPHOS 110  BILITOT 0.4  PROT 6.1*  ALBUMIN  3.2*   Recent Labs  Lab 01/29/23 1137  LIPASE 24   Coagulation Profile: Recent Labs  Lab 01/29/23 1137  INR 0.9    Radiological Exams on Admission: CT ABDOMEN PELVIS W CONTRAST Result Date: 01/29/2023 CLINICAL DATA:  Dark red rectal bleeding since last Thursday. Pancreatic cancer. * Tracking Code: BO * EXAM: CT ABDOMEN AND PELVIS WITH CONTRAST TECHNIQUE: Multidetector CT imaging of the abdomen and pelvis was performed using the standard protocol following bolus administration of intravenous contrast. RADIATION DOSE REDUCTION: This exam was performed according to the departmental dose-optimization program which  includes automated exposure control, adjustment of the mA and/or kV according to patient size and/or use of iterative reconstruction technique. CONTRAST:  OMNIPAQUE  IOHEXOL  300 MG/ML  SOLN COMPARISON:  CTA 01/12/2023. Older standard CT scans as well including September 2024. PET-CT 10/28/2022. MRI 10/18/2022. FINDINGS: Lower chest: Linear opacity lung bases likely scar or atelectasis. No pleural effusion. Hepatobiliary: Tiny right-sided dome hepatic cystic focus, unchanged from previous, nonspecific. 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 Pancreas: Severe pancreatic atrophy with ductal dilatation. There is transition point along a low-density mass seen along the head and uncinate process. This mass on series 2, image 27 measures 2.3 by 1.7 cm and is more defined today and larger than previous examinations. This has abuts the SMV. There is some progressive thrombus within the upper SMV which is nonocclusive. Preserved tissue plane to the celiac and SMA. See coronal image 32 of series 4. Spleen: Slightly lobular spleen which is nonenlarged. Adrenals/Urinary Tract: Prominent bilateral parapelvic renal cysts bilaterally. Small parenchymal cysts. No enhancing mass or collecting system dilatation. Preserved contours of the urinary bladder. Stomach/Bowel: Once again there is significant wall thickening and edema along the antrum and pylorus of the stomach. Please correlate for gastritis. There is some increasing adjacent stranding. Stomach is nondilated. Small bowel is nondilated. Large bowel has a normal course and caliber with some scattered colonic stool. There is slight wall thickening along portion of the hepatic flexure in the porta hepatis with some adjacent increasing fluid. Normal appendix seen in the right lower quadrant. Vascular/Lymphatic: Aortic atherosclerosis. No  enlarged abdominal or pelvic lymph nodes. Reproductive: Uterus and bilateral adnexa are unremarkable. Other: No free intra-air. Musculoskeletal: Streak artifact related to the significant hardware along the lumbar spine. Other areas of advanced degenerative changes along the thoracolumbar region. Degenerative changes of the pelvis. Critical Value/emergent results were called by telephone at the time of interpretation on 01/29/2023 at 1:45 pm to provider [facility] , who verbally acknowledged these results. 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 Again there is a  low-density masslike area along the uncinate process with associated severe ductal dilatation in caliber change. This has consistent with patient's history of pancreatic neoplasm. No developing lymph node enlargement or new liver lesion at this time. Progressive wall thickening along the distal stomach towards the antrum and pylorus with the edema. Please correlate for peptic ulcer disease or other process. Increasing adjacent stranding and trace fluid extending towards the porta hepatis. Electronically Signed   By: Ranell Bring M.D.   On: 01/29/2023 13:53    EKG:  None.   Assessment/Plan Principal Problem:   GI bleed Active Problems:   Current use of long term anticoagulation   Depression   Interstitial pancreatitis (HCC)   Pancreatic adenocarcinoma (HCC)  Assessment and Plan: * GI bleed 2 hospitalizations in December for GI bleed.  Recently discharged 12/21.  Hemoglobin today 8.4, last hemoglobin 5 days ago-  9.1.  Has not resumed anticoagulation with Eliquis  since discharge.  CT abdomen and pelvis with- Progressive wall thickening along the distal stomach- co-relate for PUD. - EDP to Dr. Cindie, will see in consult in a.m., IV Protonix  -Clear  liquid diet, n.p.o. midnight -trend H&H - D5+ N/s 75cc/hr x 20hrs   Current use of long term anticoagulation History of PE 2022.  On anticoagulation with Eliquis .  CT findings today showing-progression of nonocclusive thrombus in the SMV but more significant thrombus now seen in the portal vein including the main portal vein, as well as portions of the portal vein and the liver particularly segment 4. -Holding anticoagulation for now in the setting of GI bleed.   Pancreatic adenocarcinoma: Status post chemoradiation.  Currently on monitoring  Follows with oncology. -  follow-up with oncology as an outpatient.   Hypertension: On metoprolol  at home-hold in the setting of GI bleed.   Anxiety/depression: On Wellbutrin , Celexa, BuSpar , Remeron  at home   DVT prophylaxis: SCDS Code Status: FULL code Family Communication: None at bedside Disposition Plan: ~ 2 days Consults called: GI Admission status: Inpt tele I certify that at the point of admission it is my clinical judgment that the patient will require inpatient hospital care spanning beyond 2 midnights from the point of admission due to high intensity of service, high risk for further deterioration and high frequency of surveillance required.    Author: Tully FORBES Carwin, MD 01/29/2023 6:12 PM  For on call review [email or web address].

## 2023-01-29 NOTE — ED Provider Notes (Signed)
 [place] EMERGENCY DEPARTMENT AT [facility] Provider Note   CSN: [id number] Arrival date & time: 01/29/23  1034     History  Chief Complaint  Patient presents with   Rectal Bleeding    Sheryl Bender is a 78 y.o. female with PMHx anxiety, depression, GERD, pancreatic cancer who presents to ED concerned for maroon rectal bleeding/stools since Thursday. Patient has not been taking her blood thinners since 01/12/2023. Patient also complaining of abdominal pain with a sensation of generalized rolling. Patient does endorse some nausea as well but denies vomiting and diarrhea. Denies fever.  Rectal Bleeding      Home Medications Prior to Admission medications   Medication Sig Start Date End Date Taking? Authorizing Provider  acetaminophen  (TYLENOL ) 500 MG tablet Take 2 tablets (1,000 mg total) by mouth every 8 (eight) hours as needed for moderate pain, headache or fever. Patient taking differently: Take 750-1,000 mg by mouth every 8 (eight) hours as needed for moderate pain (pain score 4-6), headache or fever. 07/21/21   Ricky Fines, MD  apixaban  (ELIQUIS ) 5 MG TABS tablet Take 1 tablet (5 mg total) by mouth 2 (two) times daily. Resume taking only on 12/28 01/15/23 01/10/24  Jillian Buttery, MD  buPROPion  (WELLBUTRIN  XL) 300 MG 24 hr tablet Take 300 mg by mouth daily after breakfast. Takes differently from prescribed: 20mg  BID 06/15/21   [provider]  busPIRone  (BUSPAR ) 10 MG tablet Take 20 mg by mouth 2 (two) times daily. 01/27/21   [provider]  Cholecalciferol  (VITAMIN D3) 125 MCG (5000 UT) CAPS Take 5,000 Units by mouth daily.    [provider]  escitalopram  (LEXAPRO ) 20 MG tablet Take 20 mg by mouth at bedtime.    [provider]  lactose free nutrition (BOOST) LIQD Take 237 mLs by mouth 2 (two) times daily between meals.    [provider]  lidocaine -prilocaine  (EMLA ) cream Apply a small amount to port a cath site  (do not rub in) and cover with plastic wrap 1 hour prior to infusion appointments 09/15/21   Rogers Hai, MD  LINZESS  72 MCG capsule TAKE 1 CAPSULE BY MOUTH DAILY BEFORE BREAKFAST 01/20/23   Mansouraty, Aloha Raddle., MD  mirtazapine  (REMERON ) 15 MG tablet Take 7.5 mg by mouth at bedtime.    [provider]  omeprazole  (PRILOSEC) 40 MG capsule Take 1 capsule (40 mg total) by mouth in the morning and at bedtime. Continue 2 times daily for 1 month then continue taking once daily indefinitely 01/15/23   Jillian Buttery, MD  QUEtiapine  (SEROQUEL ) 25 MG tablet Take 25 mg by mouth 2 (two) times daily. 12/14/22   [provider]      Allergies    Other, Cherry, and Wound dressing adhesive    Review of Systems   Review of Systems  Gastrointestinal:  Positive for blood in stool and hematochezia.    Physical Exam Updated Vital Signs BP 128/65   Pulse 76   Temp 98.1 F (36.7 C) (Oral)   Resp 15   Ht 5' 2.5 (1.588 m)   Wt 59.9 kg   SpO2 99%   BMI 23.76 kg/m  Physical Exam Vitals and nursing note reviewed.  Constitutional:      General: She is not in acute distress. HENT:     Head: Normocephalic and atraumatic.     Mouth/Throat:     Mouth: Mucous membranes are moist.     Pharynx: No oropharyngeal exudate or posterior oropharyngeal erythema.  Eyes:     General: No scleral icterus.       Right eye: No discharge.        Left eye: No discharge.     Conjunctiva/sclera: Conjunctivae normal.  Cardiovascular:     Rate and Rhythm: Normal rate.     Pulses: Normal pulses.     Heart sounds: Normal heart sounds. No murmur heard. Pulmonary:     Effort: Pulmonary effort is normal. No respiratory distress.     Breath sounds: Normal breath sounds. No wheezing, rhonchi or rales.  Abdominal:     General: Abdomen is flat. Bowel sounds are normal. There is no distension.     Palpations: Abdomen is soft. There is no mass.     Tenderness: There is abdominal tenderness.      Comments: Generalized tenderness to palpation  Musculoskeletal:     Right lower leg: No edema.     Left lower leg: No edema.  Skin:    General: Skin is warm and dry.     Findings: No rash.  Neurological:     General: No focal deficit present.     Mental Status: She is alert. Mental status is at baseline.  Psychiatric:        Mood and Affect: Mood normal.     ED Results / Procedures / Treatments   Labs (all labs ordered are listed, but only abnormal results are displayed) Labs Reviewed  COMPREHENSIVE METABOLIC PANEL - Abnormal; Notable for the following components:      Result Value   Glucose, Bld 131 (*)    BUN 30 (*)    Calcium  8.8 (*)    Total Protein 6.1 (*)    Albumin  3.2 (*)    AST 47 (*)    ALT 65 (*)    All other components within normal limits  CBC WITH DIFFERENTIAL/PLATELET - Abnormal; Notable for the following components:   WBC 3.3 (*)    RBC 2.80 (*)    Hemoglobin 8.4 (*)    HCT 26.7 (*)    RDW 16.8 (*)    Lymphs Abs 0.5 (*)    All other components within normal limits  LIPASE, BLOOD  PROTIME-INR  TYPE AND SCREEN    EKG None  Radiology CT ABDOMEN PELVIS W CONTRAST Result Date: 01/29/2023 CLINICAL DATA:  Dark red rectal bleeding since last Thursday. Pancreatic cancer. * Tracking Code: BO * EXAM: CT ABDOMEN AND PELVIS WITH CONTRAST TECHNIQUE: Multidetector CT imaging of the abdomen and pelvis was performed using the standard protocol following bolus administration of intravenous contrast. RADIATION DOSE REDUCTION: This exam was performed according to the departmental dose-optimization program which includes automated exposure control, adjustment of the mA and/or kV according to patient size and/or use of iterative reconstruction technique. CONTRAST:  OMNIPAQUE  IOHEXOL  300 MG/ML  SOLN COMPARISON:  CTA 01/12/2023. Older standard CT scans as well including September 2024. PET-CT 10/28/2022. MRI 10/18/2022. FINDINGS: Lower chest: Linear opacity lung bases  likely scar or atelectasis. No pleural effusion. Hepatobiliary: Tiny right-sided dome hepatic cystic focus, unchanged from previous, nonspecific. 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 Pancreas: Severe pancreatic atrophy with ductal dilatation. There is transition point along a low-density mass seen along the head and uncinate process. This mass on series 2, image 27 measures 2.3 by 1.7 cm and is more defined today and larger than previous examinations.  This has abuts the SMV. There is some progressive thrombus within the upper SMV which is nonocclusive. Preserved tissue plane to the celiac and SMA. See coronal image 32 of series 4. Spleen: Slightly lobular spleen which is nonenlarged. Adrenals/Urinary Tract: Prominent bilateral parapelvic renal cysts bilaterally. Small parenchymal cysts. No enhancing mass or collecting system dilatation. Preserved contours of the urinary bladder. Stomach/Bowel: Once again there is significant wall thickening and edema along the antrum and pylorus of the stomach. Please correlate for gastritis. There is some increasing adjacent stranding. Stomach is nondilated. Small bowel is nondilated. Large bowel has a normal course and caliber with some scattered colonic stool. There is slight wall thickening along portion of the hepatic flexure in the porta hepatis with some adjacent increasing fluid. Normal appendix seen in the right lower quadrant. Vascular/Lymphatic: Aortic atherosclerosis. No enlarged abdominal or pelvic lymph nodes. Reproductive: Uterus and bilateral adnexa are unremarkable. Other: No free intra-air. Musculoskeletal: Streak artifact related to the significant hardware along the lumbar spine. Other areas of advanced degenerative changes along the thoracolumbar region. Degenerative changes of the pelvis. Critical Value/emergent results were  called by telephone at the time of interpretation on 01/29/2023 at 1:45 pm to provider [facility] , who verbally acknowledged these results. 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 Again there is a low-density masslike area along the uncinate process with associated severe ductal dilatation in caliber change. This has consistent with patient's history of pancreatic neoplasm. No developing lymph node enlargement or new liver lesion at this time. Progressive wall thickening along the distal stomach towards the antrum and pylorus with the edema. Please correlate for peptic ulcer disease or other process. Increasing adjacent stranding and trace fluid extending towards the porta hepatis. Electronically Signed   By: Ranell Bring M.D.   On: 01/29/2023 13:53    Procedures Procedures    Medications Ordered in ED Medications  pantoprazole  (PROTONIX ) injection 40 mg (has no administration in time range)  ondansetron  (ZOFRAN ) injection 4 mg (4 mg Intravenous Given 01/29/23 1230)  iohexol  (OMNIPAQUE ) 300 MG/ML solution 100 mL (100 mLs Intravenous Contrast Given 01/29/23 1314)  pantoprazole  (PROTONIX ) injection 40 mg (40 mg Intravenous Given 01/29/23 1503)    ED Course/ Medical Decision Making/ A&P                                 Medical Decision Making Amount and/or Complexity of Data Reviewed Labs: ordered. Radiology: ordered.  Risk Prescription drug management.    This patient presents to the ED for concern of abdominal pain, this involves an extensive number of treatment options, and is a complaint that carries with it a high risk of complications and morbidity.  The differential diagnosis includes gastroenteritis, colitis, small bowel obstruction, appendicitis, cholecystitis, pancreatitis, nephrolithiasis, UTI,  pyleonephritis   Co morbidities that complicate the patient evaluation  anxiety, depression, GERD, pancreatic cancer   Additional history obtained:  Additional history obtained from 12/29/2022 colonoscopy: reviewed and reassuring   Consultations Obtained:  I requested consultation with the GI on-call Dr. Cindie,  and discussed lab and imaging findings as well as pertinent plan - they recommend: inpatient admission, EGD tomorrow, IV PPI now.   Problem List / ED Course / Critical interventions / Medication management  Patient  presented for abdominal pain and maroon stools progressing since Thursday. Patient discharged for same symptoms 01/15/2023. Patient was on long term anticoagulation for PE in 2022, but has not taken blood thinners since 01/12/2023.  Physical exam with generalized tenderness of abdomen. Rest of physical exam reassuring. I Ordered, and personally interpreted labs.  CBC with anemia -  hemoglobin 8.4 which is downtrending from 9.1 resulted 5 days ago.  CMP reassuring.  PT/INR within normal limits. I ordered imaging studies including CT Abd/Pelvis with contrast: evaluate for structural/surgical etiology of patients' severe abdominal pain.  CT showing thrombus in SMV and portal veins. CT also showing patient's pancreatic cancer and progressive wall thickening along the distal stomach concerning for possible PUD. Consulted with Dr. Cindie who recommended IV PPI, EGD in the morning, and hospitalist admission.  Dr. Marinell may need assistance from IR in the future given patient's GI bleed along with thrombosis. Dr. Pearlean admitting provider. I have reviewed the patients home medicines and have made adjustments as needed    Social Determinants of Health:  none          Final Clinical Impression(s) / ED Diagnoses Final diagnoses:  Gastrointestinal hemorrhage, unspecified gastrointestinal hemorrhage type  Malignant neoplasm of pancreas, unspecified location  of malignancy [facility])  Thrombosis    Rx / DC Orders ED Discharge Orders     None         Hoy Nidia JULIANNA DEVONNA 01/29/23 1555    Randol Simmonds, MD 01/29/23 1730

## 2023-01-29 NOTE — Plan of Care (Signed)

## 2023-01-29 NOTE — Plan of Care (Signed)

## 2023-01-29 NOTE — Assessment & Plan Note (Addendum)
 History of PE 2022.  On anticoagulation with Eliquis .  CT findings today showing-progression of nonocclusive thrombus in the SMV but more significant thrombus now seen in the portal vein including the main portal vein, as well as portions of the portal vein and the liver particularly segment 4. -Holding anticoagulation for now in the setting of GI bleed.  02-02-2023 DC to home today. 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 Pt to remain off Eliquis  for now.

## 2023-01-29 NOTE — Assessment & Plan Note (Addendum)
 On admission, 2 hospitalizations in December for GI bleed.  Recently discharged 12/21.  Hemoglobin today 8.4, last hemoglobin 5 days ago-  9.1.  Has not resumed anticoagulation with  Eliquis  since discharge.  CT abdomen and pelvis with- Progressive wall thickening along the distal stomach- co-relate for PUD. - EDP to Dr. Cindie, will see in consult in a.m., IV Protonix  -Clear liquid diet, n.p.o. midnight -trend H&H - D5+ N/s 75cc/hr x 20hrs  02-01-2023 pt has EGD with cauterization of bleeding areas in her stomach on 01-30-2023. Pt transferred to [facility] yesterday to see IR. Unfortunately, the IR physician that wants to perform this procedure will not be able to get pt's scheduled until next Wednesday. Discussed with her treating GI provider Dr. Cindie who thinks pt can go home and come back to [facility] for thrombectomy.  He will arrange for repeat CBC next week on Monday. Pt does not have a ride for this evening. Pt can go home tomorrow. Pt will stop systemic anticoagulation for now. Decision on whether to restart anticoagulation will be made after her thrombectomy next week.  02-02-2023 DC to home today. 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 Pt to remain off Eliquis  for now.

## 2023-01-29 NOTE — Progress Notes (Signed)
 Sheryl Bender [phone]

## 2023-01-29 NOTE — ED Triage Notes (Signed)
 Pt c/o dark red rectal bleeding that started Thursday. Pt reports she hasn't started taking her blood thinner again since she was admitted for her last GI bleed in December.

## 2023-01-30 ENCOUNTER — Inpatient Hospital Stay (HOSPITAL_COMMUNITY): Payer: Medicare Other | Admitting: Anesthesiology

## 2023-01-30 ENCOUNTER — Encounter (HOSPITAL_COMMUNITY)
Admission: EM | Disposition: A | Discharge status: Home / Self Care | Payer: Self-pay | Source: Home / Self Care | Attending: Internal Medicine

## 2023-01-30 DIAGNOSIS — K2971 Gastritis, unspecified, with bleeding: Secondary | ICD-10-CM

## 2023-01-30 DIAGNOSIS — C259 Malignant neoplasm of pancreas, unspecified: Secondary | ICD-10-CM | POA: Diagnosis not present

## 2023-01-30 DIAGNOSIS — K2980 Duodenitis without bleeding: Secondary | ICD-10-CM | POA: Diagnosis not present

## 2023-01-30 DIAGNOSIS — D62 Acute posthemorrhagic anemia: Secondary | ICD-10-CM

## 2023-01-30 DIAGNOSIS — I81 Portal vein thrombosis: Secondary | ICD-10-CM

## 2023-01-30 DIAGNOSIS — K2951 Unspecified chronic gastritis with bleeding: Secondary | ICD-10-CM

## 2023-01-30 DIAGNOSIS — K55069 Acute infarction of intestine, part and extent unspecified: Secondary | ICD-10-CM

## 2023-01-30 DIAGNOSIS — K259 Gastric ulcer, unspecified as acute or chronic, without hemorrhage or perforation: Secondary | ICD-10-CM

## 2023-01-30 DIAGNOSIS — K921 Melena: Secondary | ICD-10-CM

## 2023-01-30 DIAGNOSIS — K922 Gastrointestinal hemorrhage, unspecified: Secondary | ICD-10-CM | POA: Diagnosis not present

## 2023-01-30 HISTORY — PX: HOT HEMOSTASIS: SHX5433

## 2023-01-30 HISTORY — PX: ESOPHAGOGASTRODUODENOSCOPY (EGD) WITH PROPOFOL: SHX5813

## 2023-01-30 LAB — BASIC METABOLIC PANEL
Anion gap: 6 (ref 5–15)
BUN: 19 mg/dL (ref 8–23)
CO2: 25 mmol/L (ref 22–32)
Calcium: 8.5 mg/dL — ABNORMAL LOW (ref 8.9–10.3)
Chloride: 108 mmol/L (ref 98–111)
Creatinine, Ser: 0.73 mg/dL (ref 0.44–1.00)
GFR, Estimated: >60 mL/min (ref >60)
Glucose, Bld: 104 mg/dL — ABNORMAL HIGH (ref 70–99)
Potassium: 4 mmol/L (ref 3.5–5.1)
Sodium: 139 mmol/L (ref 135–145)

## 2023-01-30 LAB — CBC
HCT: 24.1 % — ABNORMAL LOW (ref 36.0–46.0)
Hemoglobin: 7.3 g/dL — ABNORMAL LOW (ref 12.0–15.0)
MCH: 28.9 pg (ref 26.0–34.0)
MCHC: 30.3 g/dL (ref 30.0–36.0)
MCV: 95.3 fL (ref 80.0–100.0)
Platelets: 193 10*3/uL (ref 150–400)
RBC: 2.53 MIL/uL — ABNORMAL LOW (ref 3.87–5.11)
RDW: 16.9 % — ABNORMAL HIGH (ref 11.5–15.5)
WBC: 2.4 10*3/uL — ABNORMAL LOW (ref 4.0–10.5)
nRBC: 0 % (ref 0.0–0.2)

## 2023-01-30 LAB — PREPARE RBC (CROSSMATCH)

## 2023-01-30 LAB — HEMOGLOBIN AND HEMATOCRIT, BLOOD
HCT: 31.2 % — ABNORMAL LOW (ref 36.0–46.0)
Hemoglobin: 10.2 g/dL — ABNORMAL LOW (ref 12.0–15.0)

## 2023-01-30 SURGERY — ESOPHAGOGASTRODUODENOSCOPY (EGD) WITH PROPOFOL
Anesthesia: General

## 2023-01-30 MED ORDER — CALCIUM CARBONATE ANTACID 500 MG PO CHEW
1.0000 | CHEWABLE_TABLET | Freq: Three times a day (TID) | ORAL | Status: DC
  Administered 2023-01-31 – 2023-02-02 (×6): 200 mg via ORAL
  Filled 2023-01-30 (×6): qty 1

## 2023-01-30 MED ORDER — PROPOFOL 10 MG/ML IV BOLUS
INTRAVENOUS | Status: AC
  Filled 2023-01-30: qty 20

## 2023-01-30 MED ORDER — MIRTAZAPINE 15 MG PO TABS
15.0000 mg | ORAL_TABLET | Freq: Every day | ORAL | Status: DC
  Administered 2023-01-30 – 2023-02-01 (×3): 15 mg via ORAL
  Filled 2023-01-30 (×3): qty 1

## 2023-01-30 MED ORDER — QUETIAPINE FUMARATE 25 MG PO TABS
25.0000 mg | ORAL_TABLET | Freq: Two times a day (BID) | ORAL | Status: DC
  Administered 2023-01-30 – 2023-02-02 (×6): 25 mg via ORAL
  Filled 2023-01-30 (×6): qty 1

## 2023-01-30 MED ORDER — SODIUM CHLORIDE 0.9% IV SOLUTION: Freq: Once | INTRAVENOUS | Status: AC

## 2023-01-30 MED ORDER — PROPOFOL 10 MG/ML IV BOLUS
INTRAVENOUS | Status: DC | PRN
  Administered 2023-01-30: 200 mg via INTRAVENOUS

## 2023-01-30 NOTE — Anesthesia Preprocedure Evaluation (Signed)
 Anesthesia Evaluation  Patient identified by MRN, date of birth, ID band Patient awake    Reviewed: Allergy & Precautions, H&P , NPO status , Patient's Chart, lab work & pertinent test results, reviewed documented beta blocker date and time   Airway Mallampati: II  TM Distance: >3 FB Neck ROM: full    Dental no notable dental hx.    Pulmonary neg pulmonary ROS   Pulmonary exam normal breath sounds clear to auscultation       Cardiovascular Exercise Tolerance: Good hypertension, negative cardio ROS + dysrhythmias  Rhythm:regular Rate:Normal     Neuro/Psych  PSYCHIATRIC DISORDERS Anxiety Depression    negative neurological ROS  negative psych ROS   GI/Hepatic negative GI ROS, Neg liver ROS,GERD  ,,  Endo/Other  negative endocrine ROS    Renal/GU negative Renal ROS  negative genitourinary   Musculoskeletal   Abdominal   Peds  Hematology negative hematology ROS (+) Blood dyscrasia, anemia   Anesthesia Other Findings   Reproductive/Obstetrics negative OB ROS                             Anesthesia Physical Anesthesia Plan  ASA: 3 and emergent  Anesthesia Plan: General   Post-op Pain Management:    Induction:   PONV Risk Score and Plan: Propofol infusion  Airway Management Planned:   Additional Equipment:   Intra-op Plan:   Post-operative Plan:   Informed Consent: I have reviewed the patients History and Physical, chart, labs and discussed the procedure including the risks, benefits and alternatives for the proposed anesthesia with the patient or authorized representative who has indicated his/her understanding and acceptance.     Dental Advisory Given  Plan Discussed with: CRNA  Anesthesia Plan Comments:        Anesthesia Quick Evaluation

## 2023-01-30 NOTE — Plan of Care (Signed)
   Problem: Activity: Goal: Risk for activity intolerance will decrease Outcome: Progressing   Problem: Coping: Goal: Level of anxiety will decrease Outcome: Progressing   Problem: Safety: Goal: Ability to remain free from injury will improve Outcome: Progressing

## 2023-01-30 NOTE — Progress Notes (Signed)
 Discussed case further with interventional radiologist Dr. Jennefer. 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  Recommends TIPS approach for portal thrombectomy.  Discussed with hospitalist Dr. Maree, plans for transfer to [facility] for IR evaluation.  I appreciate Dr. Terrill help with this complicated patient.

## 2023-01-30 NOTE — Transfer of Care (Signed)
 Immediate Anesthesia Transfer of Care Note  [name]  Procedure(s) Performed: ESOPHAGOGASTRODUODENOSCOPY (EGD) WITH PROPOFOL  HOT HEMOSTASIS (ARGON PLASMA COAGULATION/BICAP)  Patient Location: PACU  Anesthesia Type:General  Level of Consciousness: drowsy  Airway & Oxygen Therapy: Patient Spontanous Breathing  Post-op Assessment: Report given to RN and Post -op Vital signs reviewed and stable  Post vital signs: Reviewed and stable  Last Vitals:  Vitals Value Taken Time  BP 80/42 01/30/23 0930  Temp 36.8 C 01/30/23 0926  Pulse 70 01/30/23 0935  Resp 21 01/30/23 0935  SpO2 96 % 01/30/23 0935  Vitals shown include unfiled device data.  Last Pain:  Vitals:   01/30/23 0926  TempSrc:   PainSc: Asleep         Complications: No notable events documented.

## 2023-01-30 NOTE — Progress Notes (Signed)
 PROGRESS NOTE    Sheryl Bender  FMW:[id number] DOB: Jun 08, 1945 DOA: 01/29/2023 PCP: Lonna Millman, DO   Brief Narrative:   Sheryl Bender is a 77 y.o. female with medical history significant for GI bleed, pancreatic cancer, interstitial pancreatitis, hypertension, pulmonary embolism 2022 on chronic anticoagulation. Patient presented to the ED with complaints of bloody stools that started about 2 to 3 days ago.  Recent hospitalizations 12/2 to 12/5 and again 12/18 to 12/21 for GI bleed.  Patient underwent EGD on 1/5 with findings of gastritis with hemorrhage treated with APC as well as nonbleeding gastric ulcers.  There is also noted acute main portal thrombus which is likely etiology of worsening and recurrent GI bleed for which TIPS has been recommended.  IR has been notified and will see upon transfer to [facility].  Assessment & Plan:   Principal Problem:   GI bleed Active Problems:   Current use of long term anticoagulation   Depression   Interstitial pancreatitis (HCC)   Pancreatic adenocarcinoma (HCC)  Assessment and Plan:  Recurrent GI bleed in the setting of gastritis with hemorrhage -Status post EGD 1/5 with APC to hemorrhage -Noted to have acute main portal thrombus nearly occlusive which is likely the etiology of recurrent bleeding -IR has been notified and will evaluate for TIPS after transfer -Okay to start soft diet -Plan to keep n.p.o. after midnight  Acute blood loss anemia secondary to above -Plan to transfuse 1 unit PRBC today -Continue to monitor CBC   Current use of long term anticoagulation History of PE 2022.  On anticoagulation with Eliquis .  CT findings today showing-progression of nonocclusive thrombus in the SMV but more significant thrombus now seen in the portal vein including the main portal vein, as well as portions of the portal vein and the liver particularly segment 4. -Holding anticoagulation for now in the setting of GI bleed.     Pancreatic  adenocarcinoma: Status post chemoradiation.  Currently on monitoring  Follows with oncology. -  follow-up with oncology as an outpatient.   Hypertension: On metoprolol  at home-hold in the setting of GI bleed.   Anxiety/depression: On Wellbutrin , Celexa, BuSpar , Remeron  at home   DVT prophylaxis:SCDs Code Status: Full Family Communication: Discussed with son on phone 1/5 Disposition Plan:  Status is: Inpatient Remains inpatient appropriate because: Need for IV medications.   Consultants:  GI IR  Procedures:  EGD with APC 1/5  Antimicrobials:  None   Subjective: Patient seen and evaluated today with no new acute complaints or concerns. No acute concerns or events noted overnight.  She has not had any bloody bowel movements since admission.  Objective: Vitals:   01/29/23 1300 01/29/23 2038 01/30/23 0036 01/30/23 0435  BP: 128/65 (!) 123/55 (!) 111/59 127/78  Pulse: 76 85 69 70  Resp:  14 16 16   Temp:  98.3 F (36.8 C) 98.6 F (37 C) 98.2 F (36.8 C)  TempSrc:  Oral Oral Oral  SpO2: 99% 94% 97% 97%  Weight:      Height:        Intake/Output Summary (Last 24 hours) at 01/30/2023 9297 Last data filed at 01/30/2023 0352 Gross per 24 hour  Intake 529.76 ml  Output --  Net 529.76 ml   Filed Weights   01/29/23 1053  Weight: 59.9 kg    Examination:  General exam: Appears calm and comfortable  Respiratory system: Clear to auscultation. Respiratory effort normal. Cardiovascular system: S1 & S2 heard, RRR.  Gastrointestinal system: Abdomen is soft  Central nervous system: Alert and awake Extremities: No edema Skin: No significant lesions noted Psychiatry: Flat affect.    Data Reviewed: I have personally reviewed following labs and imaging studies  CBC: Recent Labs  Lab 01/24/23 1114 01/29/23 1137 01/29/23 1826 01/30/23 0410  WBC 3.7 3.3*  --  2.4*  NEUTROABS 2.5 2.4  --   --   HGB 9.1* 8.4* 8.1* 7.3*  HCT 28.3* 26.7* 25.9* 24.1*  MCV 94 95.4  --  95.3   PLT 203 200  --  193   Basic Metabolic Panel: Recent Labs  Lab 01/29/23 1137 01/30/23 0410  NA 138 139  K 3.9 4.0  CL 108 108  CO2 25 25  GLUCOSE 131* 104*  BUN 30* 19  CREATININE 0.73 0.73  CALCIUM  8.8* 8.5*   GFR: Estimated Creatinine Clearance: 46.2 mL/min (by C-G formula based on SCr of 0.73 mg/dL). Liver Function Tests: Recent Labs  Lab 01/29/23 1137  AST 47*  ALT 65*  ALKPHOS 110  BILITOT 0.4  PROT 6.1*  ALBUMIN  3.2*   Recent Labs  Lab 01/29/23 1137  LIPASE 24   No results for input(s): AMMONIA in the last 168 hours. Coagulation Profile: Recent Labs  Lab 01/29/23 1137  INR 0.9   Cardiac Enzymes: No results for input(s): CKTOTAL, CKMB, CKMBINDEX, TROPONINI in the last 168 hours. BNP (last 3 results) No results for input(s): PROBNP in the last 8760 hours. HbA1C: No results for input(s): HGBA1C in the last 72 hours. CBG: No results for input(s): GLUCAP in the last 168 hours. Lipid Profile: No results for input(s): CHOL, HDL, LDLCALC, TRIG, CHOLHDL, LDLDIRECT in the last 72 hours. Thyroid Function Tests: No results for input(s): TSH, T4TOTAL, FREET4, T3FREE, THYROIDAB in the last 72 hours. Anemia Panel: No results for input(s): VITAMINB12, FOLATE, FERRITIN, TIBC, IRON, RETICCTPCT in the last 72 hours. Sepsis Labs: No results for input(s): PROCALCITON, LATICACIDVEN in the last 168 hours.  No results found for this or any previous visit (from the past 240 hours).       Radiology Studies: CT ABDOMEN PELVIS W CONTRAST Result Date: 01/29/2023 CLINICAL DATA:  Dark red rectal bleeding since last Thursday. Pancreatic cancer. * Tracking Code: BO * EXAM: CT ABDOMEN AND PELVIS WITH CONTRAST TECHNIQUE: Multidetector CT imaging of the abdomen and pelvis was performed using the standard protocol following bolus administration of intravenous contrast. RADIATION DOSE REDUCTION: This exam was performed according  to the departmental dose-optimization program which includes automated exposure control, adjustment of the mA and/or kV according to patient size and/or use of iterative reconstruction technique. CONTRAST:  OMNIPAQUE  IOHEXOL  300 MG/ML  SOLN COMPARISON:  CTA 01/12/2023. Older standard CT scans as well including September 2024. PET-CT 10/28/2022. MRI 10/18/2022. FINDINGS: Lower chest: Linear opacity lung bases likely scar or atelectasis. No pleural effusion. Hepatobiliary: Tiny right-sided dome hepatic cystic focus, unchanged from previous, nonspecific. 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 Pancreas: Severe pancreatic atrophy with ductal dilatation. There is transition point along a low-density mass seen along the head and uncinate process. This mass on series 2, image 27 measures 2.3 by 1.7 cm and is more defined today and larger than previous examinations. This has abuts the SMV. There is some progressive thrombus within the upper SMV which is nonocclusive. Preserved tissue plane to the celiac and SMA. See coronal image 32 of series 4. Spleen:  Slightly lobular spleen which is nonenlarged. Adrenals/Urinary Tract: Prominent bilateral parapelvic renal cysts bilaterally. Small parenchymal cysts. No enhancing mass or collecting system dilatation. Preserved contours of the urinary bladder. Stomach/Bowel: Once again there is significant wall thickening and edema along the antrum and pylorus of the stomach. Please correlate for gastritis. There is some increasing adjacent stranding. Stomach is nondilated. Small bowel is nondilated. Large bowel has a normal course and caliber with some scattered colonic stool. There is slight wall thickening along portion of the hepatic flexure in the porta hepatis with some adjacent increasing fluid. Normal appendix seen in the right lower quadrant.  Vascular/Lymphatic: Aortic atherosclerosis. No enlarged abdominal or pelvic lymph nodes. Reproductive: Uterus and bilateral adnexa are unremarkable. Other: No free intra-air. Musculoskeletal: Streak artifact related to the significant hardware along the lumbar spine. Other areas of advanced degenerative changes along the thoracolumbar region. Degenerative changes of the pelvis. Critical Value/emergent results were called by telephone at the time of interpretation on 01/29/2023 at 1:45 pm to provider [facility] , who verbally acknowledged these results. 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 Again there is a low-density masslike area along the uncinate process with associated severe ductal dilatation in caliber change. This has consistent with patient's history of pancreatic neoplasm. No developing lymph node enlargement or new liver lesion at this time. Progressive wall thickening along the distal stomach towards the antrum and pylorus with the edema. Please correlate for peptic ulcer disease or other process. Increasing adjacent stranding and trace fluid extending towards the porta hepatis. Electronically Signed   By: Ranell Bring M.D.   On: 01/29/2023 13:53        Scheduled Meds:  buPROPion   300 mg Oral QPC breakfast   busPIRone   20 mg Oral BID   escitalopram   20 mg Oral QHS   pantoprazole   40 mg Intravenous Once   pantoprazole  (PROTONIX ) IV  40 mg Intravenous Q12H   Continuous Infusions:  dextrose  5 % and 0.9 % NaCl 75 mL/hr at 01/30/23 0352     LOS: 1 day    Time spent: 55 minutes    Pinky Ravan JONETTA Fairly, DO [facility]  If 7PM-7AM, please contact night-coverage [email or web address] 01/30/2023, 7:02 AM

## 2023-01-30 NOTE — Anesthesia Postprocedure Evaluation (Signed)
 Anesthesia Post Note  [name]  Procedure(s) Performed: ESOPHAGOGASTRODUODENOSCOPY (EGD) WITH PROPOFOL  HOT HEMOSTASIS (ARGON PLASMA COAGULATION/BICAP)  Patient location during evaluation: PACU Anesthesia Type: General Level of consciousness: awake and alert Pain management: pain level controlled Vital Signs Assessment: post-procedure vital signs reviewed and stable Respiratory status: spontaneous breathing, nonlabored ventilation, respiratory function stable and patient connected to nasal cannula oxygen Cardiovascular status: blood pressure returned to baseline and stable Postop Assessment: no apparent nausea or vomiting Anesthetic complications: no   No notable events documented.   Last Vitals:  Vitals:   01/30/23 0926 01/30/23 0930  BP: (!) 75/44 (!) 80/42  Pulse: 70 68  Resp: (!) 21 19  Temp: 36.8 C   SpO2: 95% 96%    Last Pain:  Vitals:   01/30/23 0926  TempSrc:   PainSc: Asleep                 Yvonna JINNY Bosworth

## 2023-01-30 NOTE — Consult Note (Signed)
 Consulting  Provider: Nidia Mays Primary Care Physician:  Lonna Millman, DO Primary Gastroenterologist:  Dr. Shaaron  Reason for Consultation: GI bleed, acute blood loss anemia  HPI:  Sheryl Bender is a 78 y.o. 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  Bloody stools have been ongoing for approximately 2 to 3 days.  Reports mild abdominal pain, intermittent.  Recent hospitalization 01/12/2023 with similar presentation.  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  Told to hold anticoagulation for 7 days.  Today discharge hemoglobin was 8.2.  Patient states she has not restarted her Eliquis  at this time.  Stools returned to normal to approximately 2 to 3 days ago.  In the ER found to have hemoglobin 8.4, 7.3 today.  CT abdomen pelvis with contrast which I personally reviewed showed progressive wall thickening in the distal stomach, masslike area of the exiting process with severe ductal dilatation and caliber change of the pancreatic duct consistent with history of pancreatic neoplasm.  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  Patient has been started on IV Protonix .  Past Medical History:  Diagnosis Date   Anxiety    Arthritis    Depression    Dysrhythmia    hx palpitations greater than 5 yrs -neg echo, stress per pt ? where or dr   GERD (gastroesophageal reflux disease)    occ   Nausea & vomiting 08/24/2021   Pancreatic adenocarcinoma (HCC)    Pancreatic pseudocyst    Port-A-Cath in place 09/15/2021   Pulmonary embolism [facility])     September 2022    Past Surgical History:  Procedure Laterality Date   ANTERIOR LAT LUMBAR FUSION Right 12/16/2015   Procedure: RIGHT LUMBAR TWO-THREE, LUMBAR THREE-FOUR, LUMBAR FOUR-FIVE ANTEROLATERAL LUMBAR INTERBODY FUSION;  Surgeon: Fairy Levels, MD;  Location: [facility] OR;  Service: Neurosurgery;  Laterality: Right;   BALLOON DILATION N/A 08/27/2021   Procedure: BALLOON DILATION;  Surgeon: Wilhelmenia Aloha Raddle., MD;  Location: [facility];  Service: Gastroenterology;  Laterality: N/A;   BIOPSY  07/09/2021   Procedure: BIOPSY;  Surgeon: Wilhelmenia Aloha Raddle., MD;  Location: [facility] ENDOSCOPY;  Service: Gastroenterology;;   COLONOSCOPY WITH PROPOFOL  N/A 12/29/2022   Procedure: COLONOSCOPY WITH PROPOFOL ;  Surgeon: Shaaron Lamar HERO, MD;  Location: [facility];  Service: Endoscopy;  Laterality: N/A;   CYST GASTROSTOMY  08/27/2021   Procedure: CYST GASTROSTOMY;  Surgeon: Wilhelmenia Aloha Raddle., MD;  Location: [facility] ENDOSCOPY;  Service: Gastroenterology;;   ESOPHAGOGASTRODUODENOSCOPY N/A 07/09/2021   Procedure: ESOPHAGOGASTRODUODENOSCOPY (EGD);  Surgeon: Wilhelmenia Aloha Raddle., MD;  Location: [facility] ENDOSCOPY;  Service: Gastroenterology;  Laterality: N/A;   ESOPHAGOGASTRODUODENOSCOPY (EGD) WITH PROPOFOL  N/A 08/27/2021   Procedure: ESOPHAGOGASTRODUODENOSCOPY (EGD) WITH PROPOFOL ;  Surgeon: Wilhelmenia Aloha Raddle., MD;  Location: [facility] ENDOS[facility];  Service: Gastroenterology;  Laterality: N/A;   ESOPHAGOGASTRODUODENOSCOPY (EGD) WITH PROPOFOL  N/A 10/08/2021   Procedure: ESOPHAGOGASTRODUODENOSCOPY (EGD) WITH PROPOFOL ;  Surgeon: Wilhelmenia Aloha Raddle., MD;  Location: [facility];  Service: Gastroenterology;  Laterality: N/A;   ESOPHAGOGASTRODUODENOSCOPY (EGD) WITH PROPOFOL  N/A 12/29/2022   Procedure: ESOPHAGOGASTRODUODENOSCOPY (EGD) WITH PROPOFOL ;  Surgeon: Shaaron Lamar HERO, MD;  [facility];  Service: Endoscopy;  Laterality: N/A;   ESOPHAGOGASTRODUODENOSCOPY (EGD)  WITH PROPOFOL  N/A 01/13/2023   Procedure:  ESOPHAGOGASTRODUODENOSCOPY (EGD) WITH PROPOFOL ;  Surgeon: Cinderella Deatrice FALCON, MD;  Location: [facility];  Service: Endoscopy;  Laterality: N/A;   EUS N/A 07/09/2021   Procedure: UPPER ENDOSCOPIC ULTRASOUND (EUS) RADIAL;  Surgeon: Wilhelmenia Aloha Raddle., MD;  Location: [facility] ENDOSCOPY;  Service: Gastroenterology;  Laterality: N/A;   EUS N/A 08/27/2021   Procedure: UPPER ENDOSCOPIC ULTRASOUND (EUS) LINEAR;  Surgeon: Wilhelmenia Aloha Raddle., MD;  Location: [facility];  Service: Gastroenterology;  Laterality: N/A;   FINE NEEDLE ASPIRATION  07/09/2021   Procedure: FINE NEEDLE ASPIRATION (FNA) LINEAR;  Surgeon: Wilhelmenia Aloha Raddle., MD;  Location: [facility] ENDOSCOPY;  Service: Gastroenterology;;   HEMOSTASIS CLIP PLACEMENT  12/29/2022   Procedure: HEMOSTASIS CLIP PLACEMENT;  Surgeon: Shaaron Lamar HERO, MD;  [facility];  Service: Endoscopy;;   HOT HEMOSTASIS  12/29/2022   Procedure: HOT HEMOSTASIS (ARGON PLASMA COAGULATION/BICAP);  Surgeon: Shaaron Lamar HERO, MD;  [facility];  Service: Endoscopy;;   HOT HEMOSTASIS  01/13/2023   Procedure: HOT HEMOSTASIS (ARGON PLASMA COAGULATION/BICAP);  Surgeon: Cinderella Deatrice FALCON, MD;  [facility]: [facility];  Service: Endoscopy;;  used apc and goldprobe   IR IMAGING GUIDED PORT INSERTION  09/14/2021   LAPAROSCOPY N/A 02/15/2022   Procedure: STAGING DIAGNOSTIC;  Surgeon: Dasie Leonor CROME, MD;  Location: [facility] OR;  Service: General;  Laterality: N/A;   LUMBAR PERCUTANEOUS PEDICLE SCREW 3 LEVEL Bilateral 12/16/2015   Procedure: PERCUTANEOUS PEDICLE SCREWS BILATERALLY AT LUMBAR TWO-FIVE;  Surgeon: Fairy Levels, MD;  Location: [facility] OR;  Service: Neurosurgery;  Laterality: Bilateral;   PANCREATIC STENT PLACEMENT  08/27/2021   Procedure: PANCREATIC STENT PLACEMENT;  Surgeon: Wilhelmenia Aloha Raddle., MD;  Location: [facility];  Service: Gastroenterology;;   POLYPECTOMY  12/29/2022   Procedure: POLYPECTOMY INTESTINAL;  Surgeon: Shaaron Lamar HERO, MD;  [facility];  Service: Endoscopy;;   STENT REMOVAL  10/08/2021   Procedure: STENT REMOVAL;  Surgeon: Wilhelmenia Aloha Raddle., MD;  Location: [facility];  Service: Gastroenterology;;   TUBAL LIGATION  1974   WHIPPLE PROCEDURE N/A 02/15/2022   Procedure: ATTEMPTED WHIPPLE PROCEDURE;  Surgeon: Dasie Leonor CROME, MD;  Location: [facility] OR;  Service: General;  Laterality: N/A;    Prior to Admission medications   Medication Sig Start Date End Date Taking? Authorizing Provider  acetaminophen  (TYLENOL ) 500 MG tablet Take 2 tablets (1,000 mg total) by mouth every 8 (eight) hours as needed for moderate pain, headache or fever. Patient taking differently: Take 750-1,000 mg by mouth every 8 (eight) hours as needed for moderate pain (pain score 4-6), headache or fever. 07/21/21  Yes Ricky Fines, MD  buPROPion  (WELLBUTRIN  XL) 300 MG 24 hr tablet Take 300 mg by mouth daily after breakfast. Takes differently from prescribed: 20mg  BID 06/15/21  Yes [provider]  busPIRone  (BUSPAR ) 10 MG tablet Take 20 mg by mouth 2 (two) times daily. 01/27/21  Yes [provider]  Cholecalciferol  (VITAMIN D3) 125 MCG (5000 UT) CAPS Take 5,000 Units by mouth every morning.   Yes [provider]  escitalopram  (LEXAPRO ) 20 MG tablet Take 20 mg by mouth at bedtime.   Yes [provider]  lactose free nutrition (BOOST) LIQD Take 237 mLs by mouth 2 (two) times daily between meals.   Yes [provider]  lidocaine -prilocaine  (EMLA ) cream Apply a small amount to port a cath site (do not rub in) and cover with plastic wrap 1 hour prior to infusion appointments 09/15/21  Yes  Rogers Hai, MD  LINZESS  72 MCG capsule TAKE 1 CAPSULE BY MOUTH DAILY BEFORE BREAKFAST Patient taking differently: Take 72 mcg by mouth daily before breakfast. 01/20/23  Yes Mansouraty, Aloha Raddle., MD  mirtazapine  (REMERON ) 30 MG tablet Take 15 mg by mouth at bedtime.   Yes [provider]  omeprazole  (PRILOSEC) 40  MG capsule Take 1 capsule (40 mg total) by mouth in the morning and at bedtime. Continue 2 times daily for 1 month then continue taking once daily indefinitely Patient taking differently: Take 40 mg by mouth in the morning and at bedtime. 01/15/23  Yes Jillian Buttery, MD  QUEtiapine  (SEROQUEL ) 25 MG tablet Take 25 mg by mouth 2 (two) times daily. 12/14/22  Yes [provider]  apixaban  (ELIQUIS ) 5 MG TABS tablet Take 1 tablet (5 mg total) by mouth 2 (two) times daily. Resume taking only on 12/28 Patient not taking: Reported on 01/29/2023 01/15/23 01/10/24  Jillian Buttery, MD    Current Facility-Administered Medications  Medication Dose Route Frequency Provider Last Rate Last Admin   acetaminophen  (TYLENOL ) tablet 650 mg  650 mg Oral Q6H PRN Emokpae, Ejiroghene E, MD       Or   acetaminophen  (TYLENOL ) suppository 650 mg  650 mg Rectal Q6H PRN Emokpae, Ejiroghene E, MD       buPROPion  (WELLBUTRIN  XL) 24 hr tablet 300 mg  300 mg Oral QPC breakfast Emokpae, Ejiroghene E, MD   300 mg at 01/30/23 9185   busPIRone  (BUSPAR ) tablet 20 mg  20 mg Oral BID Emokpae, Ejiroghene E, MD   20 mg at 01/30/23 0813   dextrose  5 %-0.9 % sodium chloride  infusion   Intravenous Continuous Emokpae, Ejiroghene E, MD 75 mL/hr at 01/30/23 0352 Infusion Verify at 01/30/23 9647   escitalopram  (LEXAPRO ) tablet 20 mg  20 mg Oral QHS Emokpae, Ejiroghene E, MD   20 mg at 01/29/23 2223   ondansetron  (ZOFRAN ) tablet 4 mg  4 mg Oral Q6H PRN Emokpae, Ejiroghene E, MD       Or   ondansetron  (ZOFRAN ) injection 4 mg  4 mg Intravenous Q6H PRN Emokpae, Ejiroghene E, MD       pantoprazole  (PROTONIX ) injection 40 mg  40 mg Intravenous Once Emokpae, Ejiroghene E, MD       pantoprazole  (PROTONIX ) injection 40 mg  40 mg Intravenous Q12H Emokpae, Ejiroghene E, MD   40 mg at 01/30/23 0432   polyethylene glycol (MIRALAX  / GLYCOLAX ) packet 17 g  17 g Oral Daily PRN Emokpae, Ejiroghene E, MD        Allergies as of 01/29/2023 - Review  Complete 01/29/2023  Allergen Reaction Noted   Other Hives and Other (See Comments) 12/08/2015   Cherry Hives 03/30/2021   Wound dressing adhesive Rash and Other (See Comments) 12/08/2015    Family History  Problem Relation Age of Onset   Pancreatitis Brother        alcoholic pancreatitis   Pancreatic cancer Neg Hx    Colon cancer Neg Hx     Social History   Socioeconomic History   Marital status: Widowed    Spouse name: Not on file   Number of children: Not on file   Years of education: Not on file   Highest education level: Not on file  Occupational History   Not on file  Tobacco Use   Smoking status: Never   Smokeless tobacco: Never  Vaping Use   Vaping status: Never Used  Substance and Sexual Activity   Alcohol   use: No   Drug use: Never   Sexual activity: Not on file  Other Topics Concern   Not on file  Social History Narrative   Not on file   Social Drivers of Health   Financial Resource Strain: Not on file  Food Insecurity: No Food Insecurity (01/29/2023)   Hunger Vital Sign    Worried About Running Out of Food in the Last Year: Never true    Ran Out of Food in the Last Year: Never true  Transportation Needs: No Transportation Needs (01/29/2023)   PRAPARE - Administrator, Civil Service (Medical): No    Lack of Transportation (Non-Medical): No  Physical Activity: Not on file  Stress: Not on file  Social Connections: Moderately Integrated (01/29/2023)   Social Connection and Isolation Panel [NHANES]    Frequency of Communication with Friends and Family: More than three times a week    Frequency of Social Gatherings with Friends and Family: Three times a week    Attends Religious Services: More than 4 times per year    Active Member of Clubs or Organizations: Yes    Attends Banker Meetings: More than 4 times per year    Marital Status: Widowed  Intimate Partner Violence: Not At Risk (01/29/2023)   Humiliation, Afraid, Rape, and Kick  questionnaire    Fear of Current or Ex-Partner: No    Emotionally Abused: No    Physically Abused: No    Sexually Abused: No    Review of Systems: General: Negative for anorexia, weight loss, fever, chills, fatigue, weakness. Eyes: Negative for vision changes.  ENT: Negative for hoarseness, difficulty swallowing , nasal congestion. CV: Negative for chest pain, angina, palpitations, dyspnea on exertion, peripheral edema.  Respiratory: Negative for dyspnea at rest, dyspnea on exertion, cough, sputum, wheezing.  GI: See history of present illness. GU:  Negative for dysuria, hematuria, urinary incontinence, urinary frequency, nocturnal urination.  MS: Negative for joint pain, low back pain.  Derm: Negative for rash or itching.  Neuro: Negative for weakness, abnormal sensation, seizure, frequent headaches, memory loss, confusion.  Psych: Negative for anxiety, depression Endo: Negative for unusual weight change.  Heme: Negative for bruising or bleeding. Allergy: Negative for rash or hives.  Physical Exam: Vital signs in last 24 hours: Temp:  [98.1 F (36.7 C)-98.6 F (37 C)] 98.2 F (36.8 C) (01/05 0435) Pulse Rate:  [69-90] 70 (01/05 0435) Resp:  [14-16] 16 (01/05 0435) BP: (111-128)/(55-78) 127/78 (01/05 0435) SpO2:  [94 %-99 %] 97 % (01/05 0435) Weight:  [59.9 kg] 59.9 kg (01/04 1053) Last BM Date : 01/29/23 General:   Alert,  Well-developed, well-nourished, pleasant and cooperative in NAD Head:  Normocephalic and atraumatic. Eyes:  Sclera clear, no icterus.   Conjunctiva pink. Ears:  Normal auditory acuity. Nose:  No deformity, discharge,  or lesions. Mouth:  No deformity or lesions, dentition normal. Neck:  Supple; no masses or thyromegaly. Lungs:  Clear throughout to auscultation.   No wheezes, crackles, or rhonchi. No acute distress. Heart:  Regular rate and rhythm; no murmurs, clicks, rubs,  or gallops. Abdomen:  Soft, nontender and nondistended. No masses,  hepatosplenomegaly or hernias noted. Normal bowel sounds, without guarding, and without rebound.   Msk:  Symmetrical without gross deformities. Normal posture. Pulses:  Normal pulses noted. Extremities:  Without clubbing or edema. Neurologic:  Alert and  oriented x4;  grossly normal neurologically. Skin:  Intact without significant lesions or rashes. Cervical Nodes:  No significant cervical  adenopathy. Psych:  Alert and cooperative. Normal mood and affect.  Intake/Output from previous day: 01/04 0701 - 01/05 0700 In: 529.8 [I.V.:529.8] Out: -  Intake/Output this shift: No intake/output data recorded.  Lab Results: Recent Labs    01/29/23 1137 01/29/23 1826 01/30/23 0410  WBC 3.3*  --  2.4*  HGB 8.4* 8.1* 7.3*  HCT 26.7* 25.9* 24.1*  PLT 200  --  193   BMET Recent Labs    01/29/23 1137 01/30/23 0410  NA 138 139  K 3.9 4.0  CL 108 108  CO2 25 25  GLUCOSE 131* 104*  BUN 30* 19  CREATININE 0.73 0.73  CALCIUM  8.8* 8.5*   LFT Recent Labs    01/29/23 1137  PROT 6.1*  ALBUMIN  3.2*  AST 47*  ALT 65*  ALKPHOS 110  BILITOT 0.4   PT/INR Recent Labs    01/29/23 1137  LABPROT 12.5  INR 0.9   Hepatitis Panel No results for input(s): HEPBSAG, HCVAB, HEPAIGM, HEPBIGM in the last 72 hours. C-Diff No results for input(s): CDIFFTOX in the last 72 hours.  Studies/Results: CT ABDOMEN PELVIS W CONTRAST Result Date: 01/29/2023 CLINICAL DATA:  Dark red rectal bleeding since last Thursday. Pancreatic cancer. * Tracking Code: BO * EXAM: CT ABDOMEN AND PELVIS WITH CONTRAST TECHNIQUE: Multidetector CT imaging of the abdomen and pelvis was performed using the standard protocol following bolus administration of intravenous contrast. RADIATION DOSE REDUCTION: This exam was performed according to the departmental dose-optimization program which includes automated exposure control, adjustment of the mA and/or kV according to patient size and/or use of iterative reconstruction  technique. CONTRAST:  OMNIPAQUE  IOHEXOL  300 MG/ML  SOLN COMPARISON:  CTA 01/12/2023. Older standard CT scans as well including September 2024. PET-CT 10/28/2022. MRI 10/18/2022. FINDINGS: Lower chest: Linear opacity lung bases likely scar or atelectasis. No pleural effusion. Hepatobiliary: Tiny right-sided dome hepatic cystic focus, unchanged from previous, nonspecific. 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 Pancreas: Severe pancreatic atrophy with ductal dilatation. There is transition point along a low-density mass seen along the head and uncinate process. This mass on series 2, image 27 measures 2.3 by 1.7 cm and is more defined today and larger than previous examinations. This has abuts the SMV. There is some progressive thrombus within the upper SMV which is nonocclusive. Preserved tissue plane to the celiac and SMA. See coronal image 32 of series 4. Spleen: Slightly lobular spleen which is nonenlarged. Adrenals/Urinary Tract: Prominent bilateral parapelvic renal cysts bilaterally. Small parenchymal cysts. No enhancing mass or collecting system dilatation. Preserved contours of the urinary bladder. Stomach/Bowel: Once again there is significant wall thickening and edema along the antrum and pylorus of the stomach. Please correlate for gastritis. There is some increasing adjacent stranding. Stomach is nondilated. Small bowel is nondilated. Large bowel has a normal course and caliber with some scattered colonic stool. There is slight wall thickening along portion of the hepatic flexure in the porta hepatis with some adjacent increasing fluid. Normal appendix seen in the right lower quadrant. Vascular/Lymphatic: Aortic atherosclerosis. No enlarged abdominal or pelvic lymph nodes. Reproductive: Uterus and bilateral adnexa are unremarkable. Other: No free intra-air.  Musculoskeletal: Streak artifact related to the significant hardware along the lumbar spine. Other areas of advanced degenerative changes along the thoracolumbar region. Degenerative changes of the pelvis. Critical Value/emergent results were called by telephone at the time of  interpretation on 01/29/2023 at 1:45 pm to provider [facility] , who verbally acknowledged these results. 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 Again there is a low-density masslike area along the uncinate process with associated severe ductal dilatation in caliber change. This has consistent with patient's history of pancreatic neoplasm. No developing lymph node enlargement or new liver lesion at this time. Progressive wall thickening along the distal stomach towards the antrum and pylorus with the edema. Please correlate for peptic ulcer disease or other process. Increasing adjacent stranding and trace fluid extending towards the porta hepatis. Electronically Signed   By: Ranell Bring M.D.   On: 01/29/2023 13:53    Assessment: *Acute GI bleed-source likely upper *Acute blood loss anemia *Gastritis *Pancreatic adenocarcinoma status post chemoradiation *SMV/portal vein thrombosis  Plan: Will proceed with upper endoscopy to further evaluate patient's suspected upper GI bleed as well as acute blood loss anemia.  The risks including infection, bleed, or perforation as well as benefits, limitations, alternatives and imponderables have been reviewed with the patient. Potential for esophageal dilation, biopsy, etc. have also been reviewed.  Questions have been answered. All parties agreeable.  Keep patient NPO.  Agree with IV PPI twice daily.  May need to involve IR in regards to her worsening SMV/portal vein thrombosis.  Further  recommendations after endoscopy  Carlin POUR. Cindie, D.O. Gastroenterology and Hepatology [facility]    LOS: 1 day     01/30/2023, 8:49 AM

## 2023-01-30 NOTE — Op Note (Signed)
 [facility] Patient Name: Sheryl Bender Procedure Date: 01/30/2023 9:08 AM MRN: [id number] Date of Birth: 1945-02-21 Attending MD: Carlin POUR. Cindie , [place], [id number] CSN: [id number] Age: 78 Admit Type: Inpatient Procedure:                Upper GI endoscopy Indications:              Acute post hemorrhagic anemia, Melena Providers:                Carlin POUR. Cindie, DO, Olam Ada, RN, Gordy Bruckner Tech, Technician Referring MD:              Medicines:                See the Anesthesia note for documentation of the                            administered medications Complications:            No immediate complications. Estimated Blood Loss:     Estimated blood loss was minimal. Procedure:                Pre-Anesthesia Assessment:                           - The anesthesia plan was to use monitored                            anesthesia care (MAC).                           After obtaining informed consent, the endoscope was                            passed under direct vision. Throughout the                            procedure, the patient's blood pressure, pulse, and                            oxygen saturations were monitored continuously. The                            GIF-H190 ([id number]) scope was introduced through the                            mouth, and advanced to the second part of duodenum.                            The upper GI endoscopy was accomplished without                            difficulty. The patient tolerated the procedure                            well.  Scope In: 9:15:24 AM Scope Out: 9:22:11 AM Total Procedure Duration: 0 hours 6 minutes 47 seconds  Findings:      The examined esophagus was normal.      Localized moderate inflammation with hemorrhage characterized by       congestion (edema) and erythema was found in the gastric antrum and in       the prepyloric region of the stomach. ?GAVE vs hemorrhagic gastritis.        Actively oozing blood. Coagulation for hemostasis using argon plasma at       0.2 liters/minute and 30 watts was successful. Remaining portion then       sealed with APC.      Two non-bleeding superficial gastric ulcers with no stigmata of bleeding       were found in the prepyloric region of the stomach (likely from prior       treatment). The largest lesion was 5 mm in largest dimension.      Localized mild inflammation characterized by erythema was found in the       duodenal bulb.      The second portion of the duodenum was normal. Impression:               - Normal esophagus.                           - Gastritis with hemorrhage. Treated with argon                            plasma coagulation (APC).                           - Non-bleeding gastric ulcers with no stigmata of                            bleeding.                           - Duodenitis.                           - Normal second portion of the duodenum.                           - No specimens collected. Moderate Sedation:      Per Anesthesia Care Recommendation:           - Return patient to hospital ward for ongoing care.                           - Soft diet.                           - Continue on IV PPI twice daily while inpatient.                            Switch to 40 mg p.o. twice daily upon discharge.                           - Continue to monitor H&H and transfuse for  less                            than 7.                           - Difficult situation with worsening thrombus of                            the SMV/portal vein in the setting of pancreatic                            maliganncy and recurrent GI bleeding. Would hold                            Eliquis  for now. I will reach out to IR Procedure Code(s):        --- Professional ---                           [phone], Esophagogastroduodenoscopy, flexible,                            transoral; with control of bleeding, any method Diagnosis  Code(s):        --- Professional ---                           K29.71, Gastritis, unspecified, with bleeding                           K25.9, Gastric ulcer, unspecified as acute or                            chronic, without hemorrhage or perforation                           K29.80, Duodenitis without bleeding                           D62, Acute posthemorrhagic anemia                           K92.1, Melena (includes Hematochezia) CPT copyright 2022 American Medical Association. All rights reserved. The codes documented in this report are preliminary and upon coder review may  be revised to meet current compliance requirements. Carlin POUR. Cindie, DO Carlin POUR. Cindie, DO 01/30/2023 10:02:42 AM This report has been signed electronically. Number of Addenda: 0

## 2023-01-31 DIAGNOSIS — K861 Other chronic pancreatitis: Secondary | ICD-10-CM | POA: Diagnosis not present

## 2023-01-31 DIAGNOSIS — F32A Depression, unspecified: Secondary | ICD-10-CM | POA: Diagnosis not present

## 2023-01-31 DIAGNOSIS — K2951 Unspecified chronic gastritis with bleeding: Secondary | ICD-10-CM | POA: Diagnosis not present

## 2023-01-31 DIAGNOSIS — C259 Malignant neoplasm of pancreas, unspecified: Secondary | ICD-10-CM | POA: Diagnosis not present

## 2023-01-31 LAB — BASIC METABOLIC PANEL
Anion gap: 7 (ref 5–15)
BUN: 17 mg/dL (ref 8–23)
CO2: 24 mmol/L (ref 22–32)
Calcium: 8.8 mg/dL — ABNORMAL LOW (ref 8.9–10.3)
Chloride: 108 mmol/L (ref 98–111)
Creatinine, Ser: 0.85 mg/dL (ref 0.44–1.00)
GFR, Estimated: >60 mL/min (ref >60)
Glucose, Bld: 88 mg/dL (ref 70–99)
Potassium: 3.9 mmol/L (ref 3.5–5.1)
Sodium: 139 mmol/L (ref 135–145)

## 2023-01-31 LAB — CBC
HCT: 31.1 % — ABNORMAL LOW (ref 36.0–46.0)
Hemoglobin: 9.9 g/dL — ABNORMAL LOW (ref 12.0–15.0)
MCH: 30.4 pg (ref 26.0–34.0)
MCHC: 31.8 g/dL (ref 30.0–36.0)
MCV: 95.4 fL (ref 80.0–100.0)
Platelets: 196 10*3/uL (ref 150–400)
RBC: 3.26 MIL/uL — ABNORMAL LOW (ref 3.87–5.11)
RDW: 16.1 % — ABNORMAL HIGH (ref 11.5–15.5)
WBC: 3.4 10*3/uL — ABNORMAL LOW (ref 4.0–10.5)
nRBC: 0 % (ref 0.0–0.2)

## 2023-01-31 LAB — TYPE AND SCREEN
ABO/RH(D): A POS
Antibody Screen: NEGATIVE
Unit division: 0

## 2023-01-31 LAB — BPAM RBC
Blood Product Expiration Date: 202502012359
ISSUE DATE / TIME: 202501051150
Unit Type and Rh: 6200

## 2023-01-31 LAB — MAGNESIUM: Magnesium: 2.4 mg/dL (ref 1.7–2.4)

## 2023-01-31 MED ORDER — ORAL CARE MOUTH RINSE
15.0000 mL | OROMUCOSAL | Status: DC | PRN
Start: 2023-01-31 — End: 2023-02-02

## 2023-01-31 NOTE — Plan of Care (Signed)

## 2023-01-31 NOTE — TOC Initial Note (Signed)
 Transition of Care [facility]) - Initial/Assessment Note    Patient Details  Name: Sheryl Bender MRN: [id number] Date of Birth: Jul 08, 1945  [facility] CM/SW Contact:    Lucie Lunger, LCSWA Phone Number: 01/31/2023, 12:39 PM  Clinical Narrative:                 Pt is high risk for readmission. CSW spoke with pt at bedside to complete assessment. Pt states she lives alone. Pt states she completes her ADLs independently. Pt has friends that provide transportation when needed. Pt has a cane that she uses when outside of her home. Pt states that she is active with [facility] in [place] for outpatient PT services. TOC to follow.   Expected Discharge Plan: OP Rehab Barriers to Discharge: Continued Medical Work up   Patient Goals and CMS Choice Patient states their goals for this hospitalization and ongoing recovery are:: return home CMS Medicare.gov Compare Post Acute Care list provided to:: Patient Choice offered to / list presented to : Patient      Expected Discharge Plan and Services In-house Referral: Clinical Social Work Discharge Planning Services: CM Consult Post Acute Care Choice: Resumption of Svcs/PTA Provider Living arrangements for the past 2 months: Single Family Home                                      Prior Living Arrangements/Services Living arrangements for the past 2 months: Single Family Home Lives with:: Self Patient language and need for interpreter reviewed:: Yes Do you feel safe going back to the place where you live?: Yes      Need for Family Participation in Patient Care: Yes (Comment) Care giver support system in place?: Yes (comment) Current home services: DME Criminal Activity/Legal Involvement Pertinent to Current Situation/Hospitalization: No - Comment as needed  Activities of Daily Living   ADL Screening (condition at time of admission) Independently performs ADLs?: Yes (appropriate for developmental age) Is the patient deaf or  have difficulty hearing?: Yes (bilateral hearing aids) Does the patient have difficulty seeing, even when wearing glasses/contacts?: No Does the patient have difficulty concentrating, remembering, or making decisions?: Yes  Permission Sought/Granted                  Emotional Assessment Appearance:: Appears stated age Attitude/Demeanor/Rapport: Engaged Affect (typically observed): Accepting Orientation: : Oriented to Self, Oriented to Place, Oriented to  Time, Oriented to Situation Alcohol  / Substance Use: Not Applicable Psych Involvement: No (comment)  Admission diagnosis:  Thrombosis [I82.90] GI bleed [K92.2] Malignant neoplasm of pancreas, unspecified location of malignancy (HCC) [C25.9] Gastrointestinal hemorrhage, unspecified gastrointestinal hemorrhage type [K92.2] Patient Active Problem List   Diagnosis Date Noted   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   GI bleed 01/29/2023   Current use of long term anticoagulation 01/15/2023   ABLA (acute blood loss anemia) 01/15/2023   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   Upper GI bleed 01/12/2023   Symptomatic anemia 12/27/2022   Intractable nausea and vomiting 05/06/2022   Adynamic ileus (HCC) 05/06/2022   Diarrhea 05/06/2022   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   Pancreatic cancer (HCC) 02/15/2022   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   Genetic testing 09/23/2021   Port-A-Cath in place 09/15/2021   Malnutrition of moderate degree 08/25/2021   Epigastric pain 08/24/2021   Nausea & vomiting 08/24/2021   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 [place] [address] Phone: [phone] Fax: [phone]     Social Drivers of Health (SDOH) Social History: SDOH Screenings   Food Insecurity: No Food Insecurity (01/29/2023)  Housing: Low Risk  (01/29/2023)  Transportation Needs: No Transportation Needs (01/29/2023)  Utilities: Not At Risk (01/29/2023)  Social Connections: Moderately Integrated (01/29/2023)  Tobacco Use: Low Risk  (01/29/2023)   SDOH Interventions:     Readmission Risk Interventions    01/31/2023   12:36 PM 01/14/2023    2:29 PM 07/17/2021    1:22 PM  Readmission Risk Prevention Plan  Transportation Screening Complete Complete Complete  Home Care Screening  Complete Complete  Medication Review (RN CM)  Complete Complete  HRI or Home Care Consult Complete    Social Work Consult for Recovery Care Planning/Counseling Complete    Palliative Care Screening Not Applicable    Medication Review Oceanographer) Complete

## 2023-01-31 NOTE — Progress Notes (Signed)
 PROGRESS NOTE    Sheryl Bender  FMW:[id number] DOB: July 14, 1945 DOA: 01/29/2023 PCP: Lonna Millman, DO   Brief Narrative:   Sheryl Bender is a 78 y.o. female with medical history significant for GI bleed, pancreatic cancer, interstitial pancreatitis, hypertension, pulmonary embolism 2022 on chronic anticoagulation. Patient presented to the ED with complaints of bloody stools that started about 2 to 3 days ago.  Recent hospitalizations 12/2 to 12/5 and again 12/18 to 12/21 for GI bleed.  Patient underwent EGD on 1/5 with findings of gastritis with hemorrhage treated with APC as well as nonbleeding gastric ulcers.  There is also noted acute main portal thrombus which is likely etiology of worsening and recurrent GI bleed for which TIPS has been recommended.  IR has been notified and will see upon transfer to [facility].  Assessment & Plan:   Principal Problem:   GI bleed Active Problems:   Current use of long term anticoagulation   Depression   Interstitial pancreatitis (HCC)   Pancreatic adenocarcinoma (HCC)   Portal vein thrombosis  Assessment and Plan:  Recurrent GI bleed in the setting of gastritis with hemorrhage -Status post EGD 1/5 with APC to hemorrhage -Noted to have acute main portal thrombus nearly occlusive which is likely the etiology of recurrent bleeding -IR has been notified and will evaluate for TIPS and thrombectomy after transfer -Okay to start soft diet and once time decided for TIPS patient will be n.p.o. after midnight.  Acute blood loss anemia secondary to above -S/p blood transfusion (1 unit). -Continue to monitor hemoglobin trend -Transfuse for hemoglobin less than 7.   Current use of long term anticoagulation History of PE 2022.  On anticoagulation with Eliquis .  CT findings today showing-progression of nonocclusive thrombus in the SMV but more significant thrombus now seen in the portal vein including the main portal vein, as well as portions of the portal  vein and the liver particularly segment 4. -Holding anticoagulation for now in the setting of GI bleed. -Planning to resume anticoagulation after IR intervention and final clearance by GI service.    Pancreatic adenocarcinoma: Status post chemoradiation.  Currently on monitoring  Follows with oncology. -Continue follow-up with oncology as an outpatient.   Hypertension:  -Vital signs stable -Continue holding metoprolol  at the moment -Continue to follow vital signs.  Anxiety/depression:  -Continue the use of Wellbutrin , Celexa, BuSpar  and Remeron   -Flat affect appreciated on exam -No suicidal ideation or hallucinations.   DVT prophylaxis:SCDs Code Status: Full Family Communication: No family at bedside. Disposition Plan:  Status is: Inpatient Remains inpatient appropriate because: Need for IV medications.   Consultants:  GI IR  Procedures:  EGD with APC 1/5  Antimicrobials:  None   Subjective: No overt bleeding reported overnight; no chest pain, no nausea, no vomiting.  Feeling weak and hungry.  Objective: Vitals:   01/30/23 1214 01/30/23 1506 01/30/23 2034 01/31/23 0439  BP: 115/67 125/67 124/68 121/63  Pulse: 81 79 78 72  Resp:   16 16  Temp: 98.2 F (36.8 C) 98.8 F (37.1 C) 98.6 F (37 C) 98.9 F (37.2 C)  TempSrc: Oral Oral    SpO2: 97% 97% 96% 97%  Weight:      Height:        Intake/Output Summary (Last 24 hours) at 01/31/2023 1218 Last data filed at 01/31/2023 0900 Gross per 24 hour  Intake 1537.58 ml  Output --  Net 1537.58 ml   Filed Weights   01/29/23 1053  Weight: 59.9 kg  Examination: General exam: Alert, awake, oriented x 3; reporting feeling weak and being hungry.  Since not transferred to [facility] diet has been advanced. Respiratory system: Clear to auscultation. Respiratory effort normal.  Good saturation on room air. Cardiovascular system:RRR. No rubs or gallops; no JVD. Gastrointestinal system: Abdomen is nondistended,  soft and positive bowel sounds appreciated on exam. Central nervous system: No focal neurological deficits. Extremities: No cyanosis or clubbing. Skin: No r petechiae. Psychiatry: Flat affect appreciated on exam.   Data Reviewed: I have personally reviewed following labs and imaging studies  CBC: Recent Labs  Lab 01/29/23 1137 01/29/23 1826 01/30/23 0410 01/30/23 1830 01/31/23 0414  WBC 3.3*  --  2.4*  --  3.4*  NEUTROABS 2.4  --   --   --   --   HGB 8.4* 8.1* 7.3* 10.2* 9.9*  HCT 26.7* 25.9* 24.1* 31.2* 31.1*  MCV 95.4  --  95.3  --  95.4  PLT 200  --  193  --  196   Basic Metabolic Panel: Recent Labs  Lab 01/29/23 1137 01/30/23 0410 01/31/23 0414  NA 138 139 139  K 3.9 4.0 3.9  CL 108 108 108  CO2 25 25 24   GLUCOSE 131* 104* 88  BUN 30* 19 17  CREATININE 0.73 0.73 0.85  CALCIUM  8.8* 8.5* 8.8*  MG  --   --  2.4   GFR: Estimated Creatinine Clearance: 43.5 mL/min (by C-G formula based on SCr of 0.85 mg/dL).  Liver Function Tests: Recent Labs  Lab 01/29/23 1137  AST 47*  ALT 65*  ALKPHOS 110  BILITOT 0.4  PROT 6.1*  ALBUMIN  3.2*   Recent Labs  Lab 01/29/23 1137  LIPASE 24   Coagulation Profile: Recent Labs  Lab 01/29/23 1137  INR 0.9   Radiology Studies: CT ABDOMEN PELVIS W CONTRAST Result Date: 01/29/2023 CLINICAL DATA:  Dark red rectal bleeding since last Thursday. Pancreatic cancer. * Tracking Code: BO * EXAM: CT ABDOMEN AND PELVIS WITH CONTRAST TECHNIQUE: Multidetector CT imaging of the abdomen and pelvis was performed using the standard protocol following bolus administration of intravenous contrast. RADIATION DOSE REDUCTION: This exam was performed according to the departmental dose-optimization program which includes automated exposure control, adjustment of the mA and/or kV according to patient size and/or use of iterative reconstruction technique. CONTRAST:  OMNIPAQUE  IOHEXOL  300 MG/ML  SOLN COMPARISON:  CTA 01/12/2023. Older standard CT  scans as well including September 2024. PET-CT 10/28/2022. MRI 10/18/2022. FINDINGS: Lower chest: Linear opacity lung bases likely scar or atelectasis. No pleural effusion. Hepatobiliary: Tiny right-sided dome hepatic cystic focus, unchanged from previous, nonspecific. 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 Pancreas: Severe pancreatic atrophy with ductal dilatation. There is transition point along a low-density mass seen along the head and uncinate process. This mass on series 2, image 27 measures 2.3 by 1.7 cm and is more defined today and larger than previous examinations. This has abuts the SMV. There is some progressive thrombus within the upper SMV which is nonocclusive. Preserved tissue plane to the celiac and SMA. See coronal image 32 of series 4. Spleen: Slightly lobular spleen which is nonenlarged. Adrenals/Urinary Tract: Prominent bilateral parapelvic renal cysts bilaterally. Small parenchymal cysts. No enhancing mass or collecting system dilatation. Preserved contours of the urinary bladder. Stomach/Bowel: Once again there is significant wall thickening and edema along the antrum  and pylorus of the stomach. Please correlate for gastritis. There is some increasing adjacent stranding. Stomach is nondilated. Small bowel is nondilated. Large bowel has a normal course and caliber with some scattered colonic stool. There is slight wall thickening along portion of the hepatic flexure in the porta hepatis with some adjacent increasing fluid. Normal appendix seen in the right lower quadrant. Vascular/Lymphatic: Aortic atherosclerosis. No enlarged abdominal or pelvic lymph nodes. Reproductive: Uterus and bilateral adnexa are unremarkable. Other: No free intra-air. Musculoskeletal: Streak artifact related to the significant hardware along the lumbar spine. Other areas of advanced  degenerative changes along the thoracolumbar region. Degenerative changes of the pelvis. Critical Value/emergent results were called by telephone at the time of interpretation on 01/29/2023 at 1:45 pm to provider [facility] , who verbally acknowledged these results. 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 Again there is a low-density masslike area along the uncinate process with associated severe ductal dilatation in caliber change. This has consistent with patient's history of pancreatic neoplasm. No developing lymph node enlargement or new liver lesion at this time. Progressive wall thickening along the distal stomach towards the antrum and pylorus with the edema. Please correlate for peptic ulcer disease or other process. Increasing adjacent stranding and trace fluid extending towards the porta hepatis. Electronically Signed   By: Ranell Bring M.D.   On: 01/29/2023 13:53    Scheduled Meds:  buPROPion   300 mg Oral QPC breakfast   busPIRone   20 mg Oral BID   calcium  carbonate  1 tablet Oral TID WC   escitalopram   20 mg Oral QHS   mirtazapine   15 mg Oral QHS   pantoprazole   40 mg Intravenous Once   pantoprazole  (PROTONIX ) IV  40 mg Intravenous Q12H   QUEtiapine   25 mg Oral BID   Continuous Infusions:     LOS: 2 days    Time spent: 50 minutes    Eric Nunnery, MD [facility]  If 7PM-7AM, please contact night-coverage [email or web address] 01/31/2023, 12:18 PM

## 2023-02-01 ENCOUNTER — Other Ambulatory Visit: Payer: Self-pay | Admitting: *Deleted

## 2023-02-01 ENCOUNTER — Ambulatory Visit (HOSPITAL_COMMUNITY): Admission: RE | Admit: 2023-02-01 | Payer: Medicare Other | Source: Ambulatory Visit

## 2023-02-01 ENCOUNTER — Inpatient Hospital Stay (HOSPITAL_COMMUNITY): Payer: Medicare Other

## 2023-02-01 ENCOUNTER — Inpatient Hospital Stay: Payer: Medicare Other

## 2023-02-01 ENCOUNTER — Other Ambulatory Visit: Payer: Self-pay | Admitting: Student

## 2023-02-01 ENCOUNTER — Encounter (HOSPITAL_COMMUNITY): Payer: Self-pay | Admitting: Internal Medicine

## 2023-02-01 DIAGNOSIS — D649 Anemia, unspecified: Secondary | ICD-10-CM

## 2023-02-01 DIAGNOSIS — Z01810 Encounter for preprocedural cardiovascular examination: Secondary | ICD-10-CM

## 2023-02-01 DIAGNOSIS — K2901 Acute gastritis with bleeding: Secondary | ICD-10-CM | POA: Diagnosis not present

## 2023-02-01 DIAGNOSIS — I81 Portal vein thrombosis: Secondary | ICD-10-CM

## 2023-02-01 DIAGNOSIS — C259 Malignant neoplasm of pancreas, unspecified: Secondary | ICD-10-CM | POA: Diagnosis not present

## 2023-02-01 LAB — ECHOCARDIOGRAM COMPLETE
Area-P 1/2: 2.27 cm2
Height: 62.5 in
S' Lateral: 2.9 cm
Weight: 2112 [oz_av]

## 2023-02-01 MED ORDER — PANTOPRAZOLE SODIUM 40 MG PO TBEC
40.0000 mg | DELAYED_RELEASE_TABLET | Freq: Two times a day (BID) | ORAL | Status: DC
  Administered 2023-02-01 – 2023-02-02 (×2): 40 mg via ORAL
  Filled 2023-02-01 (×2): qty 1

## 2023-02-01 NOTE — Progress Notes (Signed)
 PROGRESS NOTE    Doralyn Kirkes  FMW:[id number] DOB: [birth year] DOA: 01/29/2023 PCP: Lonna Millman, DO  Subjective: Pt seen and examined. Reviewed her hospital course at [facility]. 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  Send to [facility] from [facility] for IR consult and thrombectomy.  Dr. Jennefer not available until next week to perform this complex procedure.  Discussed with her treating GI provider Dr. Cindie.  Ok for outpatient thrombectomy by IR. Stay off systemic anticoagulation. Have discussed with IR who will arrange thrombectomy as outpatient with anesthesia.  Upon further review, according to CT Abd from 01-29-2023, pt has had some old thrombus in SMV. CT abd on 01-29-2023 was compared to other studies on 01-12-2023, PET/CT 10-28-2022 and MRI 10-18-2022.  There was no mention of SMV thrombus on other reports.  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    Hospital Course: HPI: Maurica Omura is a 78 y.o. female with medical history significant for GI bleed, pancreatic cancer, interstitial pancreatitis, hypertension, pulmonary embolism 2022 on chronic anticoagulation. Patient presented to the ED with complaints of bloody stools that started about 2 to 3 days ago.  Recent hospitalizations 12/2 to 12/5 and again 12/18 to 12/21 for GI bleed. During recent hospitalization, patient underwent EGD 5/19-with findings of gastritis with active hemorrhaging treated with APC therapy and bipolar cautery. 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  She was to hold her anticoagulation with Eliquis  and resume 12/28.  On discharge hemoglobin was 8.2.   Patient reports on getting home she had some normal colored stools, reports  dark and red stools, re-occurred about 2 to 3 days ago.  Reports mild upper abdominal pain, no vomiting.  Denies NSAID use.  She decided to hold off on not starting her anticoagulation  12/28.     ED Course: Blood pressure 112-128.  Heart rate 76-90.  Hemoglobin 8.4.  CT abdomen and pelvis with contrast-question of the nonocclusive thrombus in the SMV but most significant thrombus not seen in the portal vein including the main portal vein. low-density masslike area along the uncinate process with associated severe ductal dilatation in caliber change. This has consistent with patient's history of pancreatic neoplasm.  Progressive wall thickening along the distal stomach- co-relate for PUD. EDP talked to GI Dr. Cindie, will see in consult in a.m. for possible EGD, may need to involve IR.  Significant Events: Admitted 01/29/2023 for GI bleed at [facility] 01/30/2023 EGD Gastritis with hemorrhage. Treated with argon plasma coagulation (APC). Non-bleeding gastric ulcers with no stigmata of bleeding. Duodenitis. 01/31/2023 transferred to [facility] for thrombectomy  Significant Labs: Hemoglobin 8.4   Significant Imaging Studies: CT abdomen and pelvis with contrast-question of the nonocclusive thrombus in the SMV but most significant thrombus not seen in the portal vein including the main portal vein. low-density masslike area along the uncinate process with associated severe ductal dilatation in caliber change. This has consistent with patient's history of pancreatic neoplasm.  Progressive wall thickening along the distal stomach- co-relate for PUD.   Antibiotic Therapy: Anti-infectives (From admission, onward)    None       Procedures: 01/30/2023 EGD Gastritis with hemorrhage. Treated with argon plasma coagulation (APC). Non-bleeding gastric ulcers with no stigmata of bleeding. Duodenitis.  Consultants: GI IR    Assessment and Plan: * GI bleed On admission, 2 hospitalizations in December for GI bleed.  Recently discharged 12/21.  Hemoglobin today 8.4, last hemoglobin  5 days ago-  9.1.  Has not resumed anticoagulation with  Eliquis  since discharge.  CT abdomen and pelvis with- Progressive  wall thickening along the distal stomach- co-relate for PUD. - EDP to Dr. Cindie, will see in consult in a.m., IV Protonix  -Clear liquid diet, n.p.o. midnight -trend H&H - D5+ N/s 75cc/hr x 20hrs  02-01-2023 pt has EGD with cauterization of bleeding areas in her stomach on 01-30-2023. Pt transferred to [facility] yesterday to see IR. Unfortunately, the IR physician that wants to perform this procedure will not be able to get pt's scheduled until next Wednesday. Discussed with her treating GI provider Dr. Cindie who thinks pt can go home and come back to [facility] for thrombectomy.  He will arrange for repeat CBC next week on Monday. Pt does not have a ride for this evening. Pt can go home tomorrow. Pt will stop systemic anticoagulation for now. Decision on whether to restart anticoagulation will be made after her thrombectomy next week.  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 Likely caused by her prior XRT and pancreatic cancer. IR will arrange for outpatient thrombectomy with anesthesia.  Current use of long term anticoagulation History of PE 2022.  On anticoagulation with Eliquis .  CT findings today showing-progression of nonocclusive thrombus in the SMV but more significant thrombus now seen in the portal vein including the main portal vein, as well as portions of the portal vein and the liver particularly segment 4. -Holding anticoagulation for now in the setting of GI bleed.  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   DVT prophylaxis: SCDs Start: 01/29/23 1810     Code Status: Full Code Family Communication: no family at bedside. She is decisional Disposition Plan: return home Reason for continuing need for hospitalization: will go home tomorrow. Discussed with IR and Dr. Cindie.  Objective: Vitals:   02/01/23 0042 02/01/23 0810 02/01/23 1252 02/01/23 1555  BP: (!) 98/57 133/76 128/82 118/65  Pulse: 74  73 78 78  Resp: 18  20 20   Temp: 98.4 F (36.9 C) 98 F (36.7 C) 98 F (36.7 C) 98.7 F (37.1 C)  TempSrc: Oral Oral Oral Oral  SpO2: 96% 99% 98% 99%  Weight:      Height:        Intake/Output Summary (Last 24 hours) at 02/01/2023 1619 Last data filed at 02/01/2023 9047 Gross per 24 hour  Intake 120 ml  Output --  Net 120 ml   Filed Weights   01/29/23 1053  Weight: 59.9 kg    Examination:  Physical Exam Vitals and nursing note reviewed.  Constitutional:      General: She is not in acute distress.    Appearance: She is not toxic-appearing or diaphoretic.  HENT:     Head: Normocephalic and atraumatic.     Nose: Nose normal.  Cardiovascular:     Rate and Rhythm: Normal rate and regular rhythm.  Pulmonary:     Effort: Pulmonary effort is normal.     Breath sounds: Normal breath sounds.  Abdominal:     General: Abdomen is flat. Bowel sounds are normal.     Palpations: Abdomen is soft.  Musculoskeletal:     Right lower leg: No edema.     Left lower leg: No edema.  Skin:    General: Skin is warm and dry.     Capillary Refill: Capillary refill takes less than 2 seconds.  Neurological:  Mental Status: She is alert and oriented to person, place, and time.     Data Reviewed: I have personally reviewed following labs and imaging studies  CBC: Recent Labs  Lab 01/29/23 1137 01/29/23 1826 01/30/23 0410 01/30/23 1830 01/31/23 0414  WBC 3.3*  --  2.4*  --  3.4*  NEUTROABS 2.4  --   --   --   --   HGB 8.4* 8.1* 7.3* 10.2* 9.9*  HCT 26.7* 25.9* 24.1* 31.2* 31.1*  MCV 95.4  --  95.3  --  95.4  PLT 200  --  193  --  196   Basic Metabolic Panel: Recent Labs  Lab 01/29/23 1137 01/30/23 0410 01/31/23 0414  NA 138 139 139  K 3.9 4.0 3.9  CL 108 108 108  CO2 25 25 24   GLUCOSE 131* 104* 88  BUN 30* 19 17  CREATININE 0.73 0.73 0.85  CALCIUM  8.8* 8.5* 8.8*  MG  --   --  2.4   GFR: Estimated Creatinine Clearance: 43.5 mL/min (by C-G formula based on SCr of  0.85 mg/dL). Liver Function Tests: Recent Labs  Lab 01/29/23 1137  AST 47*  ALT 65*  ALKPHOS 110  BILITOT 0.4  PROT 6.1*  ALBUMIN  3.2*   Recent Labs  Lab 01/29/23 1137  LIPASE 24    Coagulation Profile: Recent Labs  Lab 01/29/23 1137  INR 0.9    Radiology Studies: ECHOCARDIOGRAM COMPLETE Result Date: 02/01/2023    ECHOCARDIOGRAM REPORT   Patient Name:   NETANYA YAZDANI Date of Exam: 02/01/2023 Medical Rec #:  [id number]         Height:       62.5 in Accession #:    [id number]        Weight:       132.0 lb Date of Birth:  10-25-1945        BSA:          1.612 m Patient Age:    77 years          BP:           133/76 mmHg Patient Gender: F                 HR:           73 bpm. Exam Location:  Inpatient Procedure: 2D Echo, Color Doppler and Cardiac Doppler Indications:    Pre-op exam (TIPS)  History:        Patient has prior history of Echocardiogram examinations, most                 recent 03/18/2014. Risk Factors:Hypertension.  Sonographer:    Damien Senior RDCS Referring Phys: [id number] TOYA DEL HAN IMPRESSIONS  1. Left ventricular ejection fraction, by estimation, is 55 to 60%. The left ventricle has normal function. The left ventricle has no regional wall motion abnormalities. Left ventricular diastolic parameters are consistent with Grade I diastolic dysfunction (impaired relaxation).  2. Right ventricular systolic function is normal. The right ventricular size is normal. Tricuspid regurgitation signal is inadequate for assessing PA pressure.  3. A small pericardial effusion is present. The pericardial effusion is anterior to the right ventricle. There is no evidence of cardiac tamponade.  4. The mitral valve is normal in structure. Trivial mitral valve regurgitation. No evidence of mitral stenosis.  5. The aortic valve is tricuspid. There is mild calcification of the aortic valve. Aortic valve regurgitation is trivial. Aortic valve sclerosis is present, with no evidence  of aortic valve  stenosis.  6. The inferior vena cava is normal in size with greater than 50% respiratory variability, suggesting right atrial pressure of 3 mmHg. FINDINGS  Left Ventricle: Left ventricular ejection fraction, by estimation, is 55 to 60%. The left ventricle has normal function. The left ventricle has no regional wall motion abnormalities. The left ventricular internal cavity size was normal in size. There is  no left ventricular hypertrophy. Left ventricular diastolic parameters are consistent with Grade I diastolic dysfunction (impaired relaxation). Right Ventricle: The right ventricular size is normal. No increase in right ventricular wall thickness. Right ventricular systolic function is normal. Tricuspid regurgitation signal is inadequate for assessing PA pressure. Left Atrium: Left atrial size was normal in size. Right Atrium: Right atrial size was normal in size. Pericardium: A small pericardial effusion is present. The pericardial effusion is anterior to the right ventricle. There is no evidence of cardiac tamponade. Presence of epicardial fat layer. Mitral Valve: The mitral valve is normal in structure. Trivial mitral valve regurgitation. No evidence of mitral valve stenosis. Tricuspid Valve: The tricuspid valve is normal in structure. Tricuspid valve regurgitation is trivial. No evidence of tricuspid stenosis. Aortic Valve: The aortic valve is tricuspid. There is mild calcification of the aortic valve. Aortic valve regurgitation is trivial. Aortic valve sclerosis is present, with no evidence of aortic valve stenosis. Pulmonic Valve: The pulmonic valve was normal in structure. Pulmonic valve regurgitation is trivial. No evidence of pulmonic stenosis. Aorta: The aortic root and ascending aorta are structurally normal, with no evidence of dilitation. Venous: The inferior vena cava is normal in size with greater than 50% respiratory variability, suggesting right atrial pressure of 3 mmHg. IAS/Shunts: The atrial  septum is grossly normal.  LEFT VENTRICLE PLAX 2D LVIDd:         4.10 cm   Diastology LVIDs:         2.90 cm   LV e' medial:    4.68 cm/s LV PW:         0.80 cm   LV E/e' medial:  12.0 LV IVS:        0.70 cm   LV e' lateral:   9.46 cm/s LVOT diam:     2.00 cm   LV E/e' lateral: 5.9 LV SV:         47 LV SV Index:   29 LVOT Area:     3.14 cm  RIGHT VENTRICLE RV S prime:     12.80 cm/s TAPSE (M-mode): 1.9 cm LEFT ATRIUM             Index        RIGHT ATRIUM           Index LA diam:        2.60 cm 1.61 cm/m   RA Area:     11.70 cm LA Vol (A2C):   33.8 ml 20.97 ml/m  RA Volume:   24.50 ml  15.20 ml/m LA Vol (A4C):   35.7 ml 22.15 ml/m LA Biplane Vol: 36.7 ml 22.77 ml/m  AORTIC VALVE LVOT Vmax:   69.80 cm/s LVOT Vmean:  52.400 cm/s LVOT VTI:    0.151 m  AORTA Ao Root diam: 3.10 cm Ao Asc diam:  3.20 cm MITRAL VALVE MV Area (PHT): 2.27 cm    SHUNTS MV E velocity: 56.10 cm/s  Systemic VTI:  0.15 m MV A velocity: 79.70 cm/s  Systemic Diam: 2.00 cm MV E/A ratio:  0.70 Darryle Decent MD Electronically signed  by Darryle Decent MD Signature Date/Time: 02/01/2023/10:35:33 AM    Final     Scheduled Meds:  buPROPion   300 mg Oral QPC breakfast   busPIRone   20 mg Oral BID   calcium  carbonate  1 tablet Oral TID WC   escitalopram   20 mg Oral QHS   mirtazapine   15 mg Oral QHS   pantoprazole   40 mg Intravenous Once   pantoprazole  (PROTONIX ) IV  40 mg Intravenous Q12H   QUEtiapine   25 mg Oral BID   Continuous Infusions:   LOS: 3 days   Time spent: 45 minutes  Camellia Door, DO  [facility]  02/01/2023, 4:19 PM

## 2023-02-01 NOTE — Plan of Care (Signed)
 IR was requested for possible TIPS.   Dr. Jennefer was reached out by [facility] team last weekend about this patient, IR asked [facility] team to transfer the patient to [facility] for further eval and possible elective TIPS during the admission.   Patient is 78 y.o. female with medical history significant for GI bleed, pancreatic cancer, interstitial pancreatitis, hypertension, pulmonary embolism 2022 on chronic anticoagulation, recent admissions due to GIB, underwent EGD on 1/5 with findings of gastritis with hemorrhage treated with APC as well as nonbleeding gastric ulcers, there is also noted acute main portal thrombus which is likely etiology of worsening and recurrent GI bleed for which TIPS has been recommended.   MELD only 9 No cardiac related PMH, no echo in chart Discussed with Dr. Jennefer, echocardiogram limited ordered to eval right heart function.   IR aware of the patient, formal consult will be done during the admission. Please call IR for questions and concerns.    Tameria Patti H Kandace Elrod PA-C 02/01/2023 8:16 AM

## 2023-02-01 NOTE — Plan of Care (Signed)

## 2023-02-01 NOTE — Progress Notes (Signed)
 Echocardiogram 2D Echocardiogram has been performed.  Warren Lacy Maicie Vanderloop RDCS 02/01/2023, 9:22 AM

## 2023-02-01 NOTE — Care Management Important Message (Signed)
 Important Message  Patient Details  Name: Sheryl Bender MRN: [id number] Date of Birth: Jun 11, 1945   Important Message Given:  Yes - Medicare IM     [name] 02/01/2023, 2:21 PM

## 2023-02-01 NOTE — Subjective & Objective (Addendum)
 Pt seen and examined.  HgB stable 10.3 g/dl  Stable for DC. IR to arrange for thrombectomy next week Feb 09, 2023 with Dr. Elby Showers.  Pt to remain off Eliquis until told to restart Dr. Marletta Lor with GI will arrange for CBC this Monday, Feb 07, 2023

## 2023-02-01 NOTE — Assessment & Plan Note (Signed)
 02-01-2023 being followed by heme/onc as outpatient.

## 2023-02-01 NOTE — Hospital Course (Addendum)
 HPI: Sheryl Bender is a 78 y.o. female with medical history significant for GI bleed, pancreatic cancer, interstitial pancreatitis, hypertension, pulmonary embolism 2022 on chronic anticoagulation. Patient presented to the ED with complaints of bloody stools that started about 2 to 3 days ago.  Recent hospitalizations 12/2 to 12/5 and again 12/18 to 12/21 for GI bleed. During recent hospitalization, patient underwent EGD 5/19-with findings of gastritis with active hemorrhaging treated with APC therapy and bipolar cautery. 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  She was to hold her anticoagulation with Eliquis  and resume 12/28.  On discharge hemoglobin was 8.2.   Patient reports on getting home she had some normal colored stools, reports  dark and red stools, re-occurred about 2 to 3 days ago.  Reports mild upper abdominal pain, no vomiting.  Denies NSAID use.  She decided to hold off on not starting her anticoagulation 12/28.     ED Course: Blood pressure 112-128.  Heart rate 76-90.  Hemoglobin 8.4.  CT abdomen and pelvis with contrast-question of the nonocclusive thrombus in the SMV but most significant thrombus not seen in the portal vein including the main portal vein. low-density masslike area along the uncinate process with associated severe ductal dilatation in caliber change. This has consistent with patient's history of pancreatic neoplasm.  Progressive wall thickening along the distal stomach- co-relate for PUD. EDP talked to GI Dr. Cindie, will see in consult in a.m. for possible EGD, may need to involve IR.  Significant Events: Admitted 01/29/2023 for GI bleed at [facility] 01/30/2023 EGD Gastritis with hemorrhage. Treated with argon plasma coagulation (APC). Non-bleeding gastric ulcers with no stigmata of bleeding. Duodenitis. 01/31/2023 transferred to [facility] for thrombectomy  Significant Labs: Hemoglobin 8.4   Significant Imaging Studies: CT abdomen and pelvis with  contrast-question of the nonocclusive thrombus in the SMV but most significant thrombus not seen in the portal vein including the main portal vein. low-density masslike area along the uncinate process with associated severe ductal dilatation in caliber change. This has consistent with patient's history of pancreatic neoplasm.  Progressive wall thickening along the distal stomach- co-relate for PUD.   Antibiotic Therapy: Anti-infectives (From admission, onward)    None       Procedures: 01/30/2023 EGD Gastritis with hemorrhage. Treated with argon plasma coagulation (APC). Non-bleeding gastric ulcers with no stigmata of bleeding. Duodenitis.  Consultants: GI IR

## 2023-02-01 NOTE — Assessment & Plan Note (Addendum)
 02-01-2023 will need thrombectomy by IR next week with anesthesia. Likely caused by her prior XRT and pancreatic cancer. IR will arrange for outpatient thrombectomy with anesthesia.  02-02-2023 DC to home today. 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 Pt to remain off Eliquis  for now.

## 2023-02-01 NOTE — Consult Note (Signed)
 Chief Complaint: Patient was seen in consultation today for recurrent UGIB/anemia, portal vein thrombosis Chief Complaint  Patient presents with   Rectal Bleeding   at the request of Laurence Locus   Referring Physician(s): Laurence Locus   Supervising Physician: Jennefer Rover  Patient Status: [facility] - In-pt  History of Present Illness: Sheryl Bender is a 78 y.o. female with PMHs of depression, anxiety, PE in 2022, pancreatic CA s/p chemoradiation and recurrent UGIB and main portal thrombus, IR was consulted for possible interventions.   Patient is known to IR service for [facility] placement performed by Dr. Philip on 09/14/21.   Patient developed unprovoked PE in 2022 she was started on Eliquis . Imaging also showed incidental finding of pancreatic ductal dilation, work up revealed pancreatic CA. Patient underwent chemoradiation therapy, she underwent Ex lap and cholecystectomy with aborted whipple on 02/15/22, Whipple was aborted due to  extensive thick scarring with obliteration of tissue planes, preventing safe dissection of the SMV.   Patient finished her chemotherapy in May 2024 and has been on surveillance, PET on 11/02/22 raise concern for a non contour deforming pancreatic head lesion/recurrence.   Patient has been followed by hem/onc and she required blood transfusions due to hgb less than 7. Patient presented to ED on 12/27/22 due to maroon stools for few days, she was admitted and underwent EGD on 12/29/22 which showed active UGIB, gold probe application and clip was placed. Patient was discharged on 12/5 however re-admitted on 12/18 due to recurrent GIB/anemia, CTA GI bleed study was negative,  EGD showed diffuse severe inflammation with hemorrhage found in the gastric antrum and the pylorus, hemostasis obtained. Patient was discharged on 12/21, but again developed GIB and admitted on 01/29/23, CT A/P with showed:  Progression of the nonocclusive thrombus in the SMV but more significant  thrombus now seen in the portal vein including the main portal vein as well as portions of the portal vein in the liver particularly segment 4.  Patient was admitted at [facility] and EGD on 01/30/23 showed gastritis with hemorrhage, hemostasis obtained by argon plasma coagulation. IR was consulted by [facility] for possible interventions, case was reviewed by Dr. Jennefer on 1/4 who recommended patient to be transferred to [facility] for further eval and management. Patient was transferred to [facility] on 01/31/23.   Patient seen with Dr. Jennefer.  Patient is sitting in bed, NAD, finishing her lunch.  States that she had bleed three times since the beginning of December, only symptoms and sing she had was fatigue and dark stool.  Reports that her stool is still little bit dark but the color is getting lighter.  Denies SOB, abd pain, N/V.   She states that her depression medication is making her forgetful, she is overwhelmed with everything that is going on with her situation. She asks if we can send sample so that it can be send for genetic testing and possible immunotherapy. She was notified that IR can sent the blood clot to lab abs see if there is any cancer cells, if so, lab might be able to send for genetic testing. All questions answered to her satisfaction. Patient asked to call her son and give him update. Sheryl Bender was called and updated. He is aware that this procedure might be scheduled as outpatient.     Past Medical History:  Diagnosis Date   Anxiety    Arthritis    Depression    Dysrhythmia    hx palpitations greater than 5 yrs -  neg echo, stress per pt ? where or dr   GERD (gastroesophageal reflux disease)    occ   Nausea & vomiting 08/24/2021   Pancreatic adenocarcinoma (HCC)    Pancreatic pseudocyst    Port-A-Cath in place 09/15/2021   Pulmonary embolism [facility])    September 2022    Past Surgical History:  Procedure Laterality Date   ANTERIOR LAT LUMBAR FUSION Right 12/16/2015    Procedure: RIGHT LUMBAR TWO-THREE, LUMBAR THREE-FOUR, LUMBAR FOUR-FIVE ANTEROLATERAL LUMBAR INTERBODY FUSION;  Surgeon: Fairy Levels, MD;  Location: [facility] OR;  Service: Neurosurgery;  Laterality: Right;   BALLOON DILATION N/A 08/27/2021   Procedure: BALLOON DILATION;  Surgeon: Wilhelmenia Aloha Raddle., MD;  Location: [facility];  Service: Gastroenterology;  Laterality: N/A;   BIOPSY  07/09/2021   Procedure: BIOPSY;  Surgeon: Wilhelmenia Aloha Raddle., MD;  Location: [facility] ENDOSCOPY;  Service: Gastroenterology;;   COLONOSCOPY WITH PROPOFOL  N/A 12/29/2022   Procedure: COLONOSCOPY WITH PROPOFOL ;  Surgeon: Shaaron Lamar HERO, MD;  Location: [facility];  Service: Endoscopy;  Laterality: N/A;   CYST GASTROSTOMY  08/27/2021   Procedure: CYST GASTROSTOMY;  Surgeon: Wilhelmenia Aloha Raddle., MD;  Location: [facility] ENDOSCOPY;  Service: Gastroenterology;;   ESOPHAGOGASTRODUODENOSCOPY N/A 07/09/2021   Procedure: ESOPHAGOGASTRODUODENOSCOPY (EGD);  Surgeon: Wilhelmenia Aloha Raddle., MD;  Location: [facility] ENDOSCOPY;  Service: Gastroenterology;  Laterality: N/A;   ESOPHAGOGASTRODUODENOSCOPY (EGD) WITH PROPOFOL  N/A 08/27/2021   Procedure: ESOPHAGOGASTRODUODENOSCOPY (EGD) WITH PROPOFOL ;  Surgeon: Wilhelmenia Aloha Raddle., MD;  Location: [facility] ENDOSCOPY;  Service: Gastroenterology;  Laterality: N/A;   ESOPHAGOGASTRODUODENOSCOPY (EGD) WITH PROPOFOL  N/A 10/08/2021   Procedure: ESOPHAGOGASTRODUODENOSCOPY (EGD) WITH PROPOFOL ;  Surgeon: Wilhelmenia Aloha Raddle., MD;  Location: [facility];  Service: Gastroenterology;  Laterality: N/A;   ESOPHAGOGASTRODUODENOSCOPY (EGD) WITH PROPOFOL  N/A 12/29/2022   Procedure: ESOPHAGOGASTRODUODENOSCOPY (EGD) WITH PROPOFOL ;  Surgeon: Shaaron Lamar HERO, MD;  [facility];  Service: Endoscopy;  Laterality: N/A;   ESOPHAGOGASTRODUODENOSCOPY (EGD) WITH PROPOFOL  N/A 01/13/2023   Procedure: ESOPHAGOGASTRODUODENOSCOPY (EGD) WITH PROPOFOL ;  Surgeon: Cinderella Deatrice FALCON, MD;  [facility];  Service: Endoscopy;   Laterality: N/A;   EUS N/A 07/09/2021   Procedure: UPPER ENDOSCOPIC ULTRASOUND (EUS) RADIAL;  Surgeon: Wilhelmenia Aloha Raddle., MD;  Location: [facility] ENDOSCOPY;  Service: Gastroenterology;  Laterality: N/A;   EUS N/A 08/27/2021   Procedure: UPPER ENDOSCOPIC ULTRASOUND (EUS) LINEAR;  Surgeon: Wilhelmenia Aloha Raddle., MD;  Location: [facility];  Service: Gastroenterology;  Laterality: N/A;   FINE NEEDLE ASPIRATION  07/09/2021   Procedure: FINE NEEDLE ASPIRATION (FNA) LINEAR;  Surgeon: Wilhelmenia Aloha Raddle., MD;  Location: [facility] ENDOSCOPY;  Service: Gastroenterology;;   HEMOSTASIS CLIP PLACEMENT  12/29/2022   Procedure: HEMOSTASIS CLIP PLACEMENT;  Surgeon: Shaaron Lamar HERO, MD;  [facility];  Service: Endoscopy;;   HOT HEMOSTASIS  12/29/2022   Procedure: HOT HEMOSTASIS (ARGON PLASMA COAGULATION/BICAP);  Surgeon: Shaaron Lamar HERO, MD;  [facility];  Service: Endoscopy;;   HOT HEMOSTASIS  01/13/2023   Procedure: HOT HEMOSTASIS (ARGON PLASMA COAGULATION/BICAP);  Surgeon: Cinderella Deatrice FALCON, MD;  [facility];  Service: Endoscopy;;  used apc and goldprobe   IR IMAGING GUIDED PORT INSERTION  09/14/2021   LAPAROSCOPY N/A 02/15/2022   Procedure: STAGING DIAGNOSTIC;  Surgeon: Dasie Leonor CROME, MD;  Location: [facility] OR;  Service: General;  Laterality: N/A;   LUMBAR PERCUTANEOUS PEDICLE SCREW 3 LEVEL Bilateral 12/16/2015   Procedure: PERCUTANEOUS PEDICLE SCREWS BILATERALLY AT LUMBAR TWO-FIVE;  Surgeon: Fairy Levels, MD;  Location: [facility] OR;  Service: Neurosurgery;  Laterality: Bilateral;  PANCREATIC STENT PLACEMENT  08/27/2021   Procedure: PANCREATIC STENT PLACEMENT;  Surgeon: Wilhelmenia Aloha Raddle., MD;  Location: [facility];  Service: Gastroenterology;;   POLYPECTOMY  12/29/2022   Procedure: POLYPECTOMY INTESTINAL;  Surgeon: Shaaron Lamar HERO, MD;  [facility];  Service: Endoscopy;;   STENT REMOVAL  10/08/2021   Procedure: STENT REMOVAL;  Surgeon: Wilhelmenia Aloha Raddle., MD;   Location: [facility];  Service: Gastroenterology;;   TUBAL LIGATION  1974   WHIPPLE PROCEDURE N/A 02/15/2022   Procedure: ATTEMPTED WHIPPLE PROCEDURE;  Surgeon: Dasie Leonor CROME, MD;  Location: [facility] OR;  Service: General;  Laterality: N/A;    Allergies: Other, Cherry, and Wound dressing adhesive  Medications: Prior to Admission medications   Medication Sig Start Date End Date Taking? Authorizing Provider  acetaminophen  (TYLENOL ) 500 MG tablet Take 2 tablets (1,000 mg total) by mouth every 8 (eight) hours as needed for moderate pain, headache or fever. Patient taking differently: Take 750-1,000 mg by mouth every 8 (eight) hours as needed for moderate pain (pain score 4-6), headache or fever. 07/21/21  Yes Ricky Fines, MD  buPROPion  (WELLBUTRIN  XL) 300 MG 24 hr tablet Take 300 mg by mouth daily after breakfast. Takes differently from prescribed: 20mg  BID 06/15/21  Yes [provider]  busPIRone  (BUSPAR ) 10 MG tablet Take 20 mg by mouth 2 (two) times daily. 01/27/21  Yes [provider]  Cholecalciferol  (VITAMIN D3) 125 MCG (5000 UT) CAPS Take 5,000 Units by mouth every morning.   Yes [provider]  escitalopram  (LEXAPRO ) 20 MG tablet Take 20 mg by mouth at bedtime.   Yes [provider]  lactose free nutrition (BOOST) LIQD Take 237 mLs by mouth 2 (two) times daily between meals.   Yes [provider]  lidocaine -prilocaine  (EMLA ) cream Apply a small amount to port a cath site (do not rub in) and cover with plastic wrap 1 hour prior to infusion appointments 09/15/21  Yes Rogers Hai, MD  LINZESS  72 MCG capsule TAKE 1 CAPSULE BY MOUTH DAILY BEFORE BREAKFAST Patient taking differently: Take 72 mcg by mouth daily before breakfast. 01/20/23  Yes Mansouraty, Aloha Raddle., MD  mirtazapine  (REMERON ) 30 MG tablet Take 15 mg by mouth at bedtime.   Yes [provider]  omeprazole  (PRILOSEC) 40 MG capsule Take 1 capsule (40 mg total) by mouth in the  morning and at bedtime. Continue 2 times daily for 1 month then continue taking once daily indefinitely Patient taking differently: Take 40 mg by mouth in the morning and at bedtime. 01/15/23  Yes Jillian Buttery, MD  QUEtiapine  (SEROQUEL ) 25 MG tablet Take 25 mg by mouth 2 (two) times daily. 12/14/22  Yes [provider]  apixaban  (ELIQUIS ) 5 MG TABS tablet Take 1 tablet (5 mg total) by mouth 2 (two) times daily. Resume taking only on 12/28 Patient not taking: Reported on 01/29/2023 01/15/23 01/10/24  Jillian Buttery, MD     Family History  Problem Relation Age of Onset   Pancreatitis Brother        alcoholic pancreatitis   Pancreatic cancer Neg Hx    Colon cancer Neg Hx     Social History   Socioeconomic History   Marital status: Widowed    Spouse name: Not on file   Number of children: Not on file   Years of education: Not on file   Highest education level: Not on file  Occupational History   Not on file  Tobacco Use   Smoking status: Never  Smokeless tobacco: Never  Vaping Use   Vaping status: Never Used  Substance and Sexual Activity   Alcohol  use: No   Drug use: Never   Sexual activity: Not on file  Other Topics Concern   Not on file  Social History Narrative   Not on file   Social Drivers of Health   Financial Resource Strain: Not on file  Food Insecurity: No Food Insecurity (01/29/2023)   Hunger Vital Sign    Worried About Running Out of Food in the Last Year: Never true    Ran Out of Food in the Last Year: Never true  Transportation Needs: No Transportation Needs (01/29/2023)   PRAPARE - Administrator, Civil Service (Medical): No    Lack of Transportation (Non-Medical): No  Physical Activity: Not on file  Stress: Not on file  Social Connections: Moderately Integrated (01/29/2023)   Social Connection and Isolation Panel [NHANES]    Frequency of Communication with Friends and Family: More than three times a week    Frequency of Social  Gatherings with Friends and Family: Three times a week    Attends Religious Services: More than 4 times per year    Active Member of Clubs or Organizations: Yes    Attends Banker Meetings: More than 4 times per year    Marital Status: Widowed     Review of Systems: A 12 point ROS discussed and pertinent positives are indicated in the HPI above.  All other systems are negative.  Vital Signs: BP 133/76 (BP Location: Right Arm)   Pulse 73   Temp 98 F (36.7 C) (Oral)   Resp 18   Ht 5' 2.5 (1.588 m)   Wt 132 lb (59.9 kg)   SpO2 99%   BMI 23.76 kg/m    Physical Exam Vitals and nursing note reviewed.  Constitutional:      General: Patient is not in acute distress.    Appearance: Normal appearance. Patient is not ill-appearing.  HENT:     Head: Normocephalic and atraumatic.     Mouth/Throat:     Mouth: Mucous membranes are moist.     Pharynx: Oropharynx is clear.  Cardiovascular:     Rate and Rhythm: Normal rate and regular rhythm.     Pulses: Normal pulses.     Heart sounds: Normal heart sounds.  Pulmonary:     Effort: Pulmonary effort is normal.     Breath sounds: Normal breath sounds.  Abdominal:     General: Abdomen is flat. Bowel sounds are normal.     Palpations: Abdomen is soft.  Musculoskeletal:     Cervical back: Neck supple.  Skin:    General: Skin is warm and dry.     Coloration: Skin is not jaundiced or pale.  Neurological:     Mental Status: Patient is alert and oriented to person, place, and time.  Psychiatric:        Mood and Affect: Mood normal.        Behavior: Behavior normal.        Judgment: Judgment normal.    MD Evaluation Airway: WNL Heart: WNL Abdomen: WNL Chest/ Lungs: WNL ASA  Classification: 3 Mallampati/Airway Score: Two  Imaging: ECHOCARDIOGRAM COMPLETE Result Date: 02/01/2023    ECHOCARDIOGRAM REPORT   Patient Name:   Sheryl Bender Date of Exam: 02/01/2023 Medical Rec #:  [id number]         Height:       62.5 in  Accession #:  [id number]        Weight:       132.0 lb Date of Birth:  04-Jun-1945        BSA:          1.612 m Patient Age:    24 years          BP:           133/76 mmHg Patient Gender: F                 HR:           73 bpm. Exam Location:  Inpatient Procedure: 2D Echo, Color Doppler and Cardiac Doppler Indications:    Pre-op exam (TIPS)  History:        Patient has prior history of Echocardiogram examinations, most                 recent 03/18/2014. Risk Factors:Hypertension.  Sonographer:    Damien Senior RDCS Referring Phys: [id number] TOYA DEL Shemika Robbs IMPRESSIONS  1. Left ventricular ejection fraction, by estimation, is 55 to 60%. The left ventricle has normal function. The left ventricle has no regional wall motion abnormalities. Left ventricular diastolic parameters are consistent with Grade I diastolic dysfunction (impaired relaxation).  2. Right ventricular systolic function is normal. The right ventricular size is normal. Tricuspid regurgitation signal is inadequate for assessing PA pressure.  3. A small pericardial effusion is present. The pericardial effusion is anterior to the right ventricle. There is no evidence of cardiac tamponade.  4. The mitral valve is normal in structure. Trivial mitral valve regurgitation. No evidence of mitral stenosis.  5. The aortic valve is tricuspid. There is mild calcification of the aortic valve. Aortic valve regurgitation is trivial. Aortic valve sclerosis is present, with no evidence of aortic valve stenosis.  6. The inferior vena cava is normal in size with greater than 50% respiratory variability, suggesting right atrial pressure of 3 mmHg. FINDINGS  Left Ventricle: Left ventricular ejection fraction, by estimation, is 55 to 60%. The left ventricle has normal function. The left ventricle has no regional wall motion abnormalities. The left ventricular internal cavity size was normal in size. There is  no left ventricular hypertrophy. Left ventricular diastolic parameters  are consistent with Grade I diastolic dysfunction (impaired relaxation). Right Ventricle: The right ventricular size is normal. No increase in right ventricular wall thickness. Right ventricular systolic function is normal. Tricuspid regurgitation signal is inadequate for assessing PA pressure. Left Atrium: Left atrial size was normal in size. Right Atrium: Right atrial size was normal in size. Pericardium: A small pericardial effusion is present. The pericardial effusion is anterior to the right ventricle. There is no evidence of cardiac tamponade. Presence of epicardial fat layer. Mitral Valve: The mitral valve is normal in structure. Trivial mitral valve regurgitation. No evidence of mitral valve stenosis. Tricuspid Valve: The tricuspid valve is normal in structure. Tricuspid valve regurgitation is trivial. No evidence of tricuspid stenosis. Aortic Valve: The aortic valve is tricuspid. There is mild calcification of the aortic valve. Aortic valve regurgitation is trivial. Aortic valve sclerosis is present, with no evidence of aortic valve stenosis. Pulmonic Valve: The pulmonic valve was normal in structure. Pulmonic valve regurgitation is trivial. No evidence of pulmonic stenosis. Aorta: The aortic root and ascending aorta are structurally normal, with no evidence of dilitation. Venous: The inferior vena cava is normal in size with greater than 50% respiratory variability, suggesting right atrial pressure of 3 mmHg. IAS/Shunts: The atrial septum is  grossly normal.  LEFT VENTRICLE PLAX 2D LVIDd:         4.10 cm   Diastology LVIDs:         2.90 cm   LV e' medial:    4.68 cm/s LV PW:         0.80 cm   LV E/e' medial:  12.0 LV IVS:        0.70 cm   LV e' lateral:   9.46 cm/s LVOT diam:     2.00 cm   LV E/e' lateral: 5.9 LV SV:         47 LV SV Index:   29 LVOT Area:     3.14 cm  RIGHT VENTRICLE RV S prime:     12.80 cm/s TAPSE (M-mode): 1.9 cm LEFT ATRIUM             Index        RIGHT ATRIUM           Index LA  diam:        2.60 cm 1.61 cm/m   RA Area:     11.70 cm LA Vol (A2C):   33.8 ml 20.97 ml/m  RA Volume:   24.50 ml  15.20 ml/m LA Vol (A4C):   35.7 ml 22.15 ml/m LA Biplane Vol: 36.7 ml 22.77 ml/m  AORTIC VALVE LVOT Vmax:   69.80 cm/s LVOT Vmean:  52.400 cm/s LVOT VTI:    0.151 m  AORTA Ao Root diam: 3.10 cm Ao Asc diam:  3.20 cm MITRAL VALVE MV Area (PHT): 2.27 cm    SHUNTS MV E velocity: 56.10 cm/s  Systemic VTI:  0.15 m MV A velocity: 79.70 cm/s  Systemic Diam: 2.00 cm MV E/A ratio:  0.70 Darryle Decent MD Electronically signed by Darryle Decent MD Signature Date/Time: 02/01/2023/10:35:33 AM    Final    CT ABDOMEN PELVIS W CONTRAST Result Date: 01/29/2023 CLINICAL DATA:  Dark red rectal bleeding since last Thursday. Pancreatic cancer. * Tracking Code: BO * EXAM: CT ABDOMEN AND PELVIS WITH CONTRAST TECHNIQUE: Multidetector CT imaging of the abdomen and pelvis was performed using the standard protocol following bolus administration of intravenous contrast. RADIATION DOSE REDUCTION: This exam was performed according to the departmental dose-optimization program which includes automated exposure control, adjustment of the mA and/or kV according to patient size and/or use of iterative reconstruction technique. CONTRAST:  OMNIPAQUE  IOHEXOL  300 MG/ML  SOLN COMPARISON:  CTA 01/12/2023. Older standard CT scans as well including September 2024. PET-CT 10/28/2022. MRI 10/18/2022. FINDINGS: Lower chest: Linear opacity lung bases likely scar or atelectasis. No pleural effusion. Hepatobiliary: Tiny right-sided dome hepatic cystic focus, unchanged from previous, nonspecific. 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 Pancreas: Severe pancreatic atrophy with ductal dilatation. There is transition point along a low-density mass seen along the head and uncinate process. This mass on series 2,  image 27 measures 2.3 by 1.7 cm and is more defined today and larger than previous examinations. This has abuts the SMV. There is some progressive thrombus within the upper SMV which is nonocclusive. Preserved tissue plane to the celiac and SMA. See coronal image 32 of series 4. Spleen: Slightly lobular spleen which is nonenlarged. Adrenals/Urinary Tract: Prominent bilateral parapelvic renal cysts bilaterally. Small parenchymal cysts. No enhancing mass or collecting system dilatation. Preserved contours of the urinary bladder. Stomach/Bowel: Once again there  is significant wall thickening and edema along the antrum and pylorus of the stomach. Please correlate for gastritis. There is some increasing adjacent stranding. Stomach is nondilated. Small bowel is nondilated. Large bowel has a normal course and caliber with some scattered colonic stool. There is slight wall thickening along portion of the hepatic flexure in the porta hepatis with some adjacent increasing fluid. Normal appendix seen in the right lower quadrant. Vascular/Lymphatic: Aortic atherosclerosis. No enlarged abdominal or pelvic lymph nodes. Reproductive: Uterus and bilateral adnexa are unremarkable. Other: No free intra-air. Musculoskeletal: Streak artifact related to the significant hardware along the lumbar spine. Other areas of advanced degenerative changes along the thoracolumbar region. Degenerative changes of the pelvis. Critical Value/emergent results were called by telephone at the time of interpretation on 01/29/2023 at 1:45 pm to provider [facility] , who verbally acknowledged these results. 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 Again there is a low-density masslike area along the uncinate process with associated severe ductal dilatation in caliber  change. This has consistent with patient's history of pancreatic neoplasm. No developing lymph node enlargement or new liver lesion at this time. Progressive wall thickening along the distal stomach towards the antrum and pylorus with the edema. Please correlate for peptic ulcer disease or other process. Increasing adjacent stranding and trace fluid extending towards the porta hepatis. Electronically Signed   By: Ranell Bring M.D.   On: 01/29/2023 13:53   CT ANGIO GI BLEED Result Date: 01/12/2023 CLINICAL DATA:  Abdominal pain, melena, history of pancreatic cancer * Tracking Code: BO * EXAM: CTA ABDOMEN AND PELVIS WITHOUT AND WITH CONTRAST TECHNIQUE: Multidetector CT imaging of the abdomen and pelvis was performed using the standard protocol during bolus administration of intravenous contrast. Multiplanar reconstructed images and MIPs were obtained and reviewed to evaluate the vascular anatomy. RADIATION DOSE REDUCTION: This exam was performed according to the departmental dose-optimization program which includes automated exposure control, adjustment of the mA and/or kV according to patient size and/or use of iterative reconstruction technique. CONTRAST:  75mL OMNIPAQUE  IOHEXOL  350 MG/ML SOLN COMPARISON:  PET-CT, 10/28/2022, CT abdomen pelvis, 09/30/2022 FINDINGS: VASCULAR Normal contour and caliber of the abdominal aorta. No evidence of aneurysm, dissection, or other acute aortic pathology. Standard branching pattern of the abdominal aorta with solitary bilateral renal arteries. Moderate mixed calcific atherosclerosis. Review of the MIP images confirms the above findings. NON-VASCULAR Lower Chest: No acute findings.  Coronary artery calcifications. Hepatobiliary: No solid liver abnormality is seen. Unchanged subcentimeter low-attenuation no gallstones, gallbladder wall thickening, or biliary dilatation. Pancreas: Unchanged post treatment appearance of the pancreatic head (series 15, image 25). Severe ductal  dilatation and pancreatic atrophy distal to the pancreatic head, duct again measuring up to 1.1 cm in caliber (series 15, image 21). Spleen: Normal in size without significant abnormality. Adrenals/Urinary Tract: Adrenal glands are unremarkable. Numerous benign bilateral parapelvic renal cysts, for which no further follow-up or characterization is required. Kidneys are otherwise normal, without renal calculi, solid lesion, or hydronephrosis. Bladder is unremarkable. Stomach/Bowel: Thickening of the gastric antrum and pylorus (series 15, image 21). Status post appendectomy. No evidence of bowel wall thickening, distention, or inflammatory changes. Lymphatic: No enlarged abdominal or pelvic lymph nodes. Reproductive: No mass or other significant abnormality. Other: No abdominal wall hernia or abnormality. No ascites. Musculoskeletal: No acute osseous findings.  IMPRESSION: 1. Thickening of the gastric antrum and pylorus, suggestive of gastritis; as on prior examinations this is possibly secondary to radiation therapy. 2. No intraluminal contrast extravasation or other findings to specifically localize reported GI bleeding. 3. Normal contour and caliber of the abdominal aorta. No evidence of aneurysm, dissection, or other acute aortic pathology. Moderate atherosclerosis. 4. Unchanged post treatment appearance of the pancreatic head. Severe ductal dilatation and pancreatic atrophy distal to the pancreatic head. 5. Coronary artery disease. Aortic Atherosclerosis (ICD10-I70.0). Electronically Signed   By: Marolyn JONETTA Jaksch M.D.   On: 01/12/2023 14:43    Labs:  CBC: Recent Labs    01/24/23 1114 01/29/23 1137 01/29/23 1826 01/30/23 0410 01/30/23 1830 01/31/23 0414  WBC 3.7 3.3*  --  2.4*  --  3.4*  HGB 9.1* 8.4* 8.1* 7.3* 10.2* 9.9*  HCT 28.3* 26.7* 25.9* 24.1* 31.2* 31.1*  PLT 203 200  --  193  --  196    COAGS: Recent Labs    12/27/22 1923 01/29/23 1137  INR 1.1 0.9  APTT 58*  --     BMP: Recent  Labs    01/13/23 0434 01/29/23 1137 01/30/23 0410 01/31/23 0414  NA 138 138 139 139  K 3.7 3.9 4.0 3.9  CL 109 108 108 108  CO2 24 25 25 24   GLUCOSE 83 131* 104* 88  BUN 20 30* 19 17  CALCIUM  8.2* 8.8* 8.5* 8.8*  CREATININE 0.80 0.73 0.73 0.85  GFRNONAA >60 >60 >60 >60    LIVER FUNCTION TESTS: Recent Labs    12/27/22 1212 12/29/22 0423 01/12/23 1216 01/29/23 1137  BILITOT 0.3 0.5 0.3 0.4  AST 24 27 25  47*  ALT 23 24 22  65*  ALKPHOS 90 83 102 110  PROT 5.6* 4.8* 5.9* 6.1*  ALBUMIN  3.1* 2.6* 3.2* 3.2*    TUMOR MARKERS: No results for input(s): AFPTM, CEA, CA199, CHROMGRNA in the last 8760 hours.  Assessment and Plan: 78 y.o. female with pancreatic CA s/p chemoradiation, recurrent UGIB/anemia, portal vein thrombosis who presents for possible interventions.   VSS CBC stable most recent hgb 10.2 hct 31.2  BMP stable  INR 0.9 on 01/29/23  Echocardiogram today with normal RH function, LV EF 55 to 60%  CA 19-9 was 86 on 12/10/22, repeat CA 19-9 ordered per Dr. Jennefer.  Recommend holding AC till thrombectomy/TIPS is performed due to high risk of recurrent bleeding. - Dr. Laurence notified.   Risks and benefits of portal thrombectomy, TIPS, BRTO and/or additional variceal embolization were discussed with the patient and/or the patient's family including, but not limited to, infection, bleeding, damage to adjacent structures, worsening hepatic and/or cardiac function, worsening and/or the development of altered mental status/encephalopathy, non-target embolization and death.   This interventional procedure involves the use of X-rays and because of the nature of the planned procedure, it is possible that we will have prolonged use of X-ray fluoroscopy.  Potential radiation risks to you include (but are not limited to) the following: - A slightly elevated risk for cancer  several years later in life. This risk is typically less than 0.5% percent. This risk is low in  comparison to the normal incidence of human cancer, which is 33% for women and 50% for men according to the [employer] Society. - Radiation induced injury can include skin redness, resembling a rash, tissue breakdown / ulcers and hair loss (which can be temporary or permanent).   The likelihood of either of these occurring depends on the difficulty of  the procedure and whether you are sensitive to radiation due to previous procedures, disease, or genetic conditions.   IF your procedure requires a prolonged use of radiation, you will be notified and given written instructions for further action.  It is your responsibility to monitor the irradiated area for the 2 weeks following the procedure and to notify your physician if you are concerned that you have suffered a radiation induced injury.    All of the patient's questions were answered, patient is agreeable to proceed.  PLAN  Patient appears to be good candidate for portal vein thrombectomy and possible TIPS. This can be a challenging case, and should be performed by Dr. Jennefer who has reviewed the patient's case extensively. Unfortunately patient was not made NPO at [facility] and Dr. Jennefer will not be at [facility] for the rest of the week.  Patient was informed that this procedure can be scheduled as outpatient, she will need to stay overnight for observation.  She verbalized understanding.   Discussed with TRH team, OK to schedule the patient as outpatient.  Asked schedulers to book anesthesia for next Wednesday 02/09/23.  Patient will be reached out by IR schedulers the day before the procedure to go over the instruction.  If patient develops recurrent GIB/anemia between now and next Wednesday, it is highly recommended that patient to be admitted at [facility] for possible IR interventions.    Thank you for this interesting consult.  I greatly enjoyed meeting Sheryl Bender and look forward to participating in their care.  A copy of this report was  sent to the requesting provider on this date.  Electronically Signed: Toya VEAR Cousin, PA-C 02/01/2023, 12:37 PM   I spent a total of 55 Miinutes    in face to face in clinical consultation, greater than 50% of which was counseling/coordinating care for portal vein thrombectomy and possible TIPS.   This chart was dictated using voice recognition software.  Despite best efforts to proofread,  errors can occur which can change the documentation meaning.

## 2023-02-02 ENCOUNTER — Encounter: Payer: Self-pay | Admitting: *Deleted

## 2023-02-02 DIAGNOSIS — K2901 Acute gastritis with bleeding: Secondary | ICD-10-CM | POA: Diagnosis not present

## 2023-02-02 DIAGNOSIS — C259 Malignant neoplasm of pancreas, unspecified: Secondary | ICD-10-CM | POA: Diagnosis not present

## 2023-02-02 DIAGNOSIS — I81 Portal vein thrombosis: Secondary | ICD-10-CM | POA: Diagnosis not present

## 2023-02-02 LAB — CBC WITH DIFFERENTIAL/PLATELET
Abs Immature Granulocytes: 0.01 K/uL (ref 0.00–0.07)
Basophils Absolute: 0 K/uL (ref 0.0–0.1)
Basophils Relative: 0 %
Eosinophils Absolute: 0 K/uL (ref 0.0–0.5)
Eosinophils Relative: 1 %
HCT: 31.1 % — ABNORMAL LOW (ref 36.0–46.0)
Hemoglobin: 10.3 g/dL — ABNORMAL LOW (ref 12.0–15.0)
Immature Granulocytes: 0 %
Lymphocytes Relative: 21 %
Lymphs Abs: 0.7 K/uL (ref 0.7–4.0)
MCH: 30.5 pg (ref 26.0–34.0)
MCHC: 33.1 g/dL (ref 30.0–36.0)
MCV: 92 fL (ref 80.0–100.0)
Monocytes Absolute: 0.5 K/uL (ref 0.1–1.0)
Monocytes Relative: 16 %
Neutro Abs: 2 K/uL (ref 1.7–7.7)
Neutrophils Relative %: 62 %
Platelets: 168 K/uL (ref 150–400)
RBC: 3.38 MIL/uL — ABNORMAL LOW (ref 3.87–5.11)
RDW: 15.9 % — ABNORMAL HIGH (ref 11.5–15.5)
WBC: 3.2 K/uL — ABNORMAL LOW (ref 4.0–10.5)
nRBC: 0 % (ref 0.0–0.2)

## 2023-02-02 LAB — COMPREHENSIVE METABOLIC PANEL WITH GFR
ALT: 43 U/L (ref 0–44)
AST: 33 U/L (ref 15–41)
Albumin: 2.9 g/dL — ABNORMAL LOW (ref 3.5–5.0)
Alkaline Phosphatase: 105 U/L (ref 38–126)
Anion gap: 8 (ref 5–15)
BUN: 20 mg/dL (ref 8–23)
CO2: 24 mmol/L (ref 22–32)
Calcium: 9 mg/dL (ref 8.9–10.3)
Chloride: 106 mmol/L (ref 98–111)
Creatinine, Ser: 0.93 mg/dL (ref 0.44–1.00)
GFR, Estimated: >60 mL/min (ref >60)
Glucose, Bld: 98 mg/dL (ref 70–99)
Potassium: 3.8 mmol/L (ref 3.5–5.1)
Sodium: 138 mmol/L (ref 135–145)
Total Bilirubin: 0.4 mg/dL (ref 0.0–1.2)
Total Protein: 5.6 g/dL — ABNORMAL LOW (ref 6.5–8.1)

## 2023-02-02 LAB — CANCER ANTIGEN 19-9: CA 19-9: 226 U/mL — ABNORMAL HIGH (ref 0–35)

## 2023-02-02 MED ORDER — ONDANSETRON HCL 4 MG PO TABS: 4.0000 mg | ORAL_TABLET | Freq: Four times a day (QID) | ORAL | 0 refills | Status: AC | PRN

## 2023-02-02 MED ORDER — APIXABAN 5 MG PO TABS: 5.0000 mg | ORAL_TABLET | Freq: Two times a day (BID) | ORAL | Status: DC

## 2023-02-02 MED ORDER — PANTOPRAZOLE SODIUM 40 MG PO TBEC: 40.0000 mg | DELAYED_RELEASE_TABLET | Freq: Two times a day (BID) | ORAL | 0 refills | Status: DC

## 2023-02-02 NOTE — Progress Notes (Signed)
 Referring Provider: Lonna Millman, DO Primary Care Physician:  Lonna Millman, DO Primary GI Physician: Dr. Shaaron  Chief Complaint  Patient presents with   Follow-up    Follow up. She feels dizzy and lightheaded     HPI:   Sheryl Bender is a 78 y.o. 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  CT on 01/29/23 after presetting to the ER with recurrent GI bleed and abdominal pain showed progression of nonocclusive thrombus in SMV, more significant thrombus seen in portal vein including main portal vein and portal vein and liver, progressive wall thickening along distal stomach towards antrum and pylorus, and findings consistent with known pancreatic neoplasm.  Repeat EGD 01/30/2023 again showed gastritis with hemorrhage s/p APC, nonbleeding gastric ulcers, duodenitis.  Case was reviewed with IR.  Was felt that portal vein thrombus was likely the etiology of patient's worsening/recurrent GI bleeding and IR recommended TIPS approach for portal thrombectomy which is scheduled for 02/15/2023. Eliquis  placed on hold and recommended being admitted to [facility] if recurrent bleeding issues between now and procedure for possible IR interventions. She is presenting today for follow-up.    Today:  Just discharged from the hospital yesterday.  Hemoglobin had declined to 7.3.  She received 1 unit PRBCs.  Hemoglobin was 10.3 yesterday.  Had a dark stool this morning. Not black. Stool consistency was normal. Didn't look like it has in the past with  bleeding. Feeling tired, weak. SOB with minimal exertion. Has to sit down after doing something for 5-10 minutes. Feels the same today as she did yesterday.   No brbpr.  Has some intermittent upper abdominal pain.  No nausea or vomiting.  Eating soft foods. Drinking plenty of fluids.    No GERD symptoms.  No NSAIDs.    Recent GI procedures:  Colonoscopy 12/29/22 with 6 mm polyp in the mid sigmoid colon removed, minimal amount of old blood in the cecum with no actively bleeding lesions anywhere in the lower GI tract.   EGD 12/29/22 showing 2 antral Dielafoy's with active bleeding status post gold probe and clipped. Two other small areas with slight oozing in immediate prepyloric mucosa sealed with gold probe.   EGD 01/13/23 showing gastritis with active hemorrhaging treated with APC therapy and bipolar cautery. 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   EGD 01/30/2023 showing gastritis with hemorrhage s/p APC, nonbleeding gastric ulcers, duodenitis.   Past Medical History:  Diagnosis Date   Anxiety    Arthritis    Depression    Dysrhythmia    hx palpitations greater than 5 yrs -neg echo, stress per pt ? where or dr   GERD (gastroesophageal reflux disease)    occ   Nausea & vomiting 08/24/2021   Pancreatic adenocarcinoma (HCC)    Pancreatic pseudocyst    Port-A-Cath in place 09/15/2021   Pulmonary embolism [employer])    September 2022    Past Surgical History:  Procedure Laterality Date   ANTERIOR LAT LUMBAR FUSION Right 12/16/2015   Procedure: RIGHT LUMBAR TWO-THREE, LUMBAR THREE-FOUR, LUMBAR FOUR-FIVE ANTEROLATERAL LUMBAR INTERBODY FUSION;  Surgeon: Fairy Levels, MD;  Location: [facility] [place] OR;  Service: Neurosurgery;  Laterality: Right;   BALLOON DILATION N/A 08/27/2021   Procedure: BALLOON DILATION;  Surgeon: Wilhelmenia Aloha Raddle., MD;  Location: [facility];  Service: Gastroenterology;  Laterality: N/A;   BIOPSY  07/09/2021   Procedure: BIOPSY;  Surgeon:  Wilhelmenia Aloha Raddle., MD;  Location: [facility] ENDOSCOPY;  Service: Gastroenterology;;   COLONOSCOPY WITH PROPOFOL  N/A 12/29/2022   Procedure: COLONOSCOPY WITH PROPOFOL ;  Surgeon: Shaaron Lamar HERO, MD;  Location: [facility];  Service: Endoscopy;  Laterality: N/A;   CYST GASTROSTOMY  08/27/2021   Procedure: CYST GASTROSTOMY;  Surgeon: Wilhelmenia Aloha Raddle., MD;  Location: [facility];  Service: Gastroenterology;;   ESOPHAGOGASTRODUODENOSCOPY N/A 07/09/2021   Procedure: ESOPHAGOGASTRODUODENOSCOPY (EGD);  Surgeon: Wilhelmenia Aloha Raddle., MD;  Location: [facility] ENDOSCOPY;  Service: Gastroenterology;  Laterality: N/A;   ESOPHAGOGASTRODUODENOSCOPY (EGD) WITH PROPOFOL  N/A 08/27/2021   Procedure: ESOPHAGOGASTRODUODENOSCOPY (EGD) WITH PROPOFOL ;  Surgeon: Wilhelmenia Aloha Raddle., MD;  Location: [facility];  Service: Gastroenterology;  Laterality: N/A;   ESOPHAGOGASTRODUODENOSCOPY (EGD) WITH PROPOFOL  N/A 10/08/2021   Procedure: ESOPHAGOGASTRODUODENOSCOPY (EGD) WITH PROPOFOL ;  Surgeon: Wilhelmenia Aloha Raddle., MD;  Location: [facility];  Service: Gastroenterology;  Laterality: N/A;   ESOPHAGOGASTRODUODENOSCOPY (EGD) WITH PROPOFOL  N/A 12/29/2022   Procedure: ESOPHAGOGASTRODUODENOSCOPY (EGD) WITH PROPOFOL ;  Surgeon: Shaaron Lamar HERO, MD;  [facility]: [facility];  Service: Endoscopy;  Laterality: N/A;   ESOPHAGOGASTRODUODENOSCOPY (EGD) WITH PROPOFOL  N/A 01/13/2023   Procedure: ESOPHAGOGASTRODUODENOSCOPY (EGD) WITH PROPOFOL ;  Surgeon: Cinderella Deatrice FALCON, MD;  Location: [facility];  Service: Endoscopy;  Laterality: N/A;   EUS N/A 07/09/2021   Procedure: UPPER ENDOSCOPIC ULTRASOUND (EUS) RADIAL;  Surgeon: Wilhelmenia Aloha Raddle., MD;  Location: [facility] ENDOSCOPY;  Service: Gastroenterology;  Laterality: N/A;   EUS N/A 08/27/2021   Procedure: UPPER ENDOSCOPIC ULTRASOUND (EUS) LINEAR;  Surgeon: Wilhelmenia Aloha Raddle., MD;  Location: [facility];  Service: Gastroenterology;  Laterality: N/A;   FINE NEEDLE ASPIRATION  07/09/2021    Procedure: FINE NEEDLE ASPIRATION (FNA) LINEAR;  Surgeon: Wilhelmenia Aloha Raddle., MD;  Location: [facility] ENDOSCOPY;  Service: Gastroenterology;;   HEMOSTASIS CLIP PLACEMENT  12/29/2022   Procedure: HEMOSTASIS CLIP PLACEMENT;  Surgeon: Shaaron Lamar HERO, MD;  Location: [facility];  Service: Endoscopy;;   HOT HEMOSTASIS  12/29/2022   Procedure: HOT HEMOSTASIS (ARGON PLASMA COAGULATION/BICAP);  Surgeon: Shaaron Lamar HERO, MD;  Location: [facility];  Service: Endoscopy;;   HOT HEMOSTASIS  01/13/2023   Procedure: HOT HEMOSTASIS (ARGON PLASMA COAGULATION/BICAP);  Surgeon: Cinderella Deatrice FALCON, MD;  Location: [facility];  Service: Endoscopy;;  used apc and goldprobe   IR IMAGING GUIDED PORT INSERTION  09/14/2021   LAPAROSCOPY N/A 02/15/2022   Procedure: STAGING DIAGNOSTIC;  Surgeon: Dasie Leonor CROME, MD;  Location[facility] OR;  Service: General;  Laterality: N/A;   LUMBAR PERCUTANEOUS PEDICLE SCREW 3 LEVEL Bilateral 12/16/2015   Procedure: PERCUTANEOUS PEDICLE SCREWS BILATERALLY AT LUMBAR TWO-FIVE;  Surgeon: Fairy Levels, MD;  Location: [facility] OR;  Service: Neurosurgery;  Laterality: Bilateral;   PANCREATIC STENT PLACEMENT  08/27/2021   Procedure: PANCREATIC STENT PLACEMENT;  Surgeon: Wilhelmenia Aloha Raddle., MD;  Location: [facility];  Service: Gastroenterology;;   POLYPECTOMY  12/29/2022   Procedure: POLYPECTOMY INTESTINAL;  Surgeon: Shaaron Lamar HERO, MD;  Location: [facility];  Service: Endoscopy;;   STENT REMOVAL  10/08/2021   Procedure: CLEDA REMOVAL;  Surgeon: Wilhelmenia Aloha Raddle., MD;  Location: [facility];  Service: Gastroenterology;;   TUBAL LIGATION  1974   WHIPPLE PROCEDURE N/A  02/15/2022   Procedure: ATTEMPTED WHIPPLE PROCEDURE;  Surgeon: Dasie Leonor CROME, MD;  Location: [facility] OR;  Service: General;  Laterality: N/A;    Current Outpatient Medications  Medication Sig Dispense Refill   acetaminophen  (TYLENOL ) 500 MG tablet Take 2 tablets (1,000 mg total) by mouth every 8 (eight) hours as needed for  moderate pain, headache or fever. (Patient taking differently: Take 750-1,000 mg by mouth every 8 (eight) hours as needed for moderate pain (pain score 4-6), headache or fever.)     buPROPion  (WELLBUTRIN  XL) 300 MG 24 hr tablet Take 300 mg by mouth daily after breakfast. Takes differently from prescribed: 20mg  BID     busPIRone  (BUSPAR ) 10 MG tablet Take 20 mg by mouth 2 (two) times daily.     Cholecalciferol  (VITAMIN D3) 125 MCG (5000 UT) CAPS Take 5,000 Units by mouth every morning.     escitalopram  (LEXAPRO ) 20 MG tablet Take 20 mg by mouth at bedtime.     lactose free nutrition (BOOST) LIQD Take 237 mLs by mouth 2 (two) times daily between meals.     lidocaine -prilocaine  (EMLA ) cream Apply a small amount to port a cath site (do not rub in) and cover with plastic wrap 1 hour prior to infusion appointments 30 g 3   LINZESS  72 MCG capsule TAKE 1 CAPSULE BY MOUTH DAILY BEFORE BREAKFAST (Patient taking differently: Take 72 mcg by mouth daily before breakfast.) 30 capsule 0   mirtazapine  (REMERON ) 30 MG tablet Take 15 mg by mouth at bedtime.     ondansetron  (ZOFRAN ) 4 MG tablet Take 1 tablet (4 mg total) by mouth every 6 (six) hours as needed for nausea or vomiting. 30 tablet 0   pantoprazole  (PROTONIX ) 40 MG tablet Take 1 tablet (40 mg total) by mouth 2 (two) times daily. 180 tablet 0   QUEtiapine  (SEROQUEL ) 25 MG tablet Take 25 mg by mouth 2 (two) times daily.     No current facility-administered medications for this visit.    Allergies as of 02/03/2023 - Review Complete 02/03/2023  Allergen Reaction Noted   Other Hives and Other (See Comments) 12/08/2015   Cherry Hives 03/30/2021   Wound dressing adhesive Rash and Other (See Comments) 12/08/2015    Family History  Problem Relation Age of Onset   Pancreatitis Brother        alcoholic pancreatitis   Pancreatic cancer Neg Hx    Colon cancer Neg Hx     Social History   Socioeconomic History   Marital status: Widowed    Spouse name:  Not on file   Number of children: Not on file   Years of education: Not on file   Highest education level: Not on file  Occupational History   Not on file  Tobacco Use   Smoking status: Never   Smokeless tobacco: Never  Vaping Use   Vaping status: Never Used  Substance and Sexual Activity   Alcohol  use: No   Drug use: Never   Sexual activity: Not on file  Other Topics Concern   Not on file  Social History Narrative   Not on file   Social Drivers of Health   Financial Resource Strain: Not on file  Food Insecurity: No Food Insecurity (01/29/2023)   Hunger Vital Sign    Worried About Running Out of Food in the Last Year: Never true    Ran Out of Food in the Last Year: Never true  Transportation Needs: No Transportation Needs (01/29/2023)   PRAPARE - Transportation  Lack of Transportation (Medical): No    Lack of Transportation (Non-Medical): No  Physical Activity: Not on file  Stress: Not on file  Social Connections: Moderately Integrated (01/29/2023)   Social Connection and Isolation Panel [NHANES]    Frequency of Communication with Friends and Family: More than three times a week    Frequency of Social Gatherings with Friends and Family: Three times a week    Attends Religious Services: More than 4 times per year    Active Member of Clubs or Organizations: Yes    Attends Banker Meetings: More than 4 times per year    Marital Status: Widowed    Review of Systems: Gen: Denies fever, chills, cold or flulike symptoms. CV: Denies chest pain, palpitations. Resp: Denies dyspnea, cough.  GI: See HPI Heme: See HPI  Physical Exam: BP 131/76 (BP Location: Left Arm, Patient Position: Sitting, Cuff Size: Normal)   Pulse (!) 128   Temp (!) 96.9 F (36.1 C) (Temporal)   Ht 5' 2 (1.575 m)   Wt 129 lb 3.2 oz (58.6 kg)   BMI 23.63 kg/m  General:   Alert and oriented. No distress noted. Pleasant and cooperative.  Head:  Normocephalic and atraumatic. Eyes:   Conjuctiva clear without scleral icterus. Heart:  S1, S2 present without murmurs appreciated. Lungs:  Clear to auscultation bilaterally. No wheezes, rales, or rhonchi. No distress.  Abdomen:  +BS, soft, and non-distended. Mild TTP in the epigastric and RUQ region. No rebound or guarding. No HSM or masses noted.  Msk:  Symmetrical without gross deformities. Normal posture. Extremities:  Without edema. Neurologic:  Alert and  oriented x4 Psych:  Normal mood and affect.    Assessment:  78 y.o. female presenting today with a history of with a past medical history of stage I pancreatic adenocarcinoma status post treatment with chemoradiation, anxiety, depression, pulmonary embolism diagnosed September 2022 previously on Eliquis , IDA, recurrent upper GI bleeding, SVT thrombus and now with portal vein thrombus, presenting today for hospital follow-up of recurrent GI bleeding with acute on chronic anemia.   Acute on chronic anemia/recurrent GI bleeding/portal vein and SMV thrombosis: Recurrent upper GI bleeding with acute on chronic anemia since December 2024.  EGD 12/29/2022 with 2 antral Dieulafoy's with active bleeding as well as 2 other areas with slight oozing s/p gold probe and clipped.  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  EGD 01/30/2023 with gastritis with active hemorrhaging s/p APC, nonbleeding gastric ulcers, duodenitis.  On 1/4, she was also found to have progression of nonocclusive thrombus in SMV, more significant thrombus seen in portal vein including main portal vein and portal vein in liver.  It was felt that this was likely the cause of her worsening/recurrent GI bleeding.  Case reviewed with IR who recommended TIPS approach for portal thrombectomy which is currently scheduled for 02/15/2023.  Eliquis  is currently on hold in light of recurrent bleeding.   She was just discharged from the hospital  yesterday.  Her hemoglobin declined as low as 7.3.  She received 1 unit PRBCs and hemoglobin was 10.3 yesterday.  She reports having a dark, but not black stool this morning.  She continues to feel weak, short of breath on exertion.   I will plan to update CBC to ensure her hemoglobin is remaining stable.  Will need to continue to keep a close eye on this in the coming weeks.  Advised if she  has any recurrent obvious GI bleeding, she should return to the ER, preferably at [facility], if she feels comfortable going that far, for possible IR interventions   Plan:  Repeat CBC today. Will likely plan to monitor this weekly for now.  Continue pantoprazole  40 mg twice daily. Continue to avoid NSAIDs. Keep plans for TIPS/thrombectomy 02/15/2023. Return to the ER if recurrent BRBPR or melena.  Recommended [facility] if possible.    Josette Centers, PA-C [facility] Gastro[facility] 02/03/2023

## 2023-02-02 NOTE — Progress Notes (Signed)
 Sheryl Bender was contacted by telephone to verify understanding of discharge instructions status post their most recent discharge from the hospital on the date:  02/01/23.  Inpatient discharge AVS was re-reviewed with patient, along with cancer center appointments.  Verification of understanding for oncology specific follow-up was validated using the Teach Back method.    Transportation to appointments were confirmed for the patient as being self/caregiver.  Sheryl Bender questions were addressed to their satisfaction upon completion of this post discharge follow-up call for outpatient oncology.  She will call if she needs to change any appointments.

## 2023-02-02 NOTE — Discharge Summary (Addendum)
 [facility] Phys[facility] Discharge Summary   Patient name: Sheryl Bender  Admit date:     01/29/2023  Discharge date: 02/02/2023  Attending Physician: EMOKPAE, EJIROGHENE E [[id number]  Discharge Physician: Camellia Door   PCP: Lonna Millman, DO  Admitted From: Home  Disposition:  Home  Recommendations for Outpatient Follow-up:  Follow up with PCP in 1-2 weeks IR will schedule thrombectomy for Feb 15, 2023 Please follow up on the following pending results: pt will have CBC drawn on Monday, Feb 07, 2023. Her discharge HgB is 10.3 g/dl  Home Health:No Equipment/Devices: None  Discharge Condition:Stable CODE STATUS:FULL Diet recommendation: Regular Fluid Restriction: None  Hospital Summary: HPI: Sheryl Bender is a 78 y.o. female with medical history significant for GI bleed, pancreatic cancer, interstitial pancreatitis, hypertension, pulmonary embolism 2022 on chronic anticoagulation. Patient presented to the ED with complaints of bloody stools that started about 2 to 3 days ago.  Recent hospitalizations 12/2 to 12/5 and again 12/18 to 12/21 for GI bleed. During recent hospitalization, patient underwent EGD 5/19-with findings of gastritis with active hemorrhaging treated with APC therapy and bipolar cautery. 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  She was to hold her anticoagulation with Eliquis  and resume 12/28.  On discharge hemoglobin was 8.2.   Patient reports on getting home she had some normal colored stools, reports  dark and red stools, re-occurred about 2 to 3 days ago.  Reports mild upper abdominal pain, no vomiting.  Denies NSAID use.  She decided to hold off on not starting her anticoagulation 12/28.     ED Course: Blood pressure 112-128.  Heart rate 76-90.  Hemoglobin 8.4.  CT abdomen and pelvis with contrast-question of the nonocclusive thrombus in the SMV but most significant thrombus not seen in the portal vein including the main portal vein.  low-density masslike area along the uncinate process with associated severe ductal dilatation in caliber change. This has consistent with patient's history of pancreatic neoplasm.  Progressive wall thickening along the distal stomach- co-relate for PUD. EDP talked to GI Dr. Cindie, will see in consult in a.m. for possible EGD, may need to involve IR.  Significant Events: Admitted 01/29/2023 for GI bleed at [facility] 01/30/2023 EGD Gastritis with hemorrhage. Treated with argon plasma coagulation (APC). Non-bleeding gastric ulcers with no stigmata of bleeding. Duodenitis. 01/31/2023 transferred to [facility] for thrombectomy  Significant Labs: Hemoglobin 8.4   Significant Imaging Studies: CT abdomen and pelvis with contrast-question of the nonocclusive thrombus in the SMV but most significant thrombus not seen in the portal vein including the main portal vein. low-density masslike area along the uncinate process with associated severe ductal dilatation in caliber change. This has consistent with patient's history of pancreatic neoplasm.  Progressive wall thickening along the distal stomach- co-relate for PUD.   Antibiotic Therapy: Anti-infectives (From admission, onward)    None       Procedures: 01/30/2023 EGD Gastritis with hemorrhage. Treated with argon plasma coagulation (APC). Non-bleeding gastric ulcers with no stigmata of bleeding. Duodenitis.  Consultants: GI IR   Hospital Course by Problem: * GI bleed On admission, 2 hospitalizations in December for GI bleed.  Recently discharged 12/21.  Hemoglobin today 8.4, last hemoglobin 5 days ago-  9.1.  Has not resumed anticoagulation with  Eliquis  since discharge.  CT abdomen and pelvis with- Progressive wall thickening along the distal stomach- co-relate for PUD. - EDP to Dr. Cindie, will see in consult in a.m., IV Protonix  -Clear liquid diet, n.p.o. midnight -trend  H&H - D5+ N/s 75cc/hr x 20hrs  02-01-2023 pt has EGD with cauterization of  bleeding areas in her stomach on 01-30-2023. Pt transferred to [facility] yesterday to see IR. Unfortunately, the IR physician that wants to perform this procedure will not be able to get pt's scheduled until next Wednesday. Discussed with her treating GI provider Dr. Cindie who thinks pt can go home and come back to [facility] for thrombectomy.  He will arrange for repeat CBC next week on Monday. Pt does not have a ride for this evening. Pt can go home tomorrow. Pt will stop systemic anticoagulation for now. Decision on whether to restart anticoagulation will be made after her thrombectomy next week.  02-02-2023 DC to home today. 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 Pt to remain off Eliquis  for now.  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 Likely caused by her prior XRT and pancreatic cancer. IR will arrange for outpatient thrombectomy with anesthesia.  02-02-2023 DC to home today. 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 Pt to remain off Eliquis  for now.  Current use of long term anticoagulation History of PE 2022.  On anticoagulation with Eliquis .  CT findings today showing-progression of nonocclusive thrombus in the SMV but more significant thrombus now seen in the portal vein including the main portal vein, as well as portions of the portal vein and the liver particularly segment 4. -Holding anticoagulation for now in the setting of GI bleed.  02-02-2023 DC to home today. 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 Pt to remain off Eliquis  for now.  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    Discharge Diagnoses:  Principal Problem:   GI bleed Active Problems:   Portal vein thrombosis   Current use of long term anticoagulation   Depression   Interstitial pancreatitis (HCC)   Pancreatic adenocarcinoma [facility])   Discharge  Instructions  Discharge Instructions     Call MD for:  difficulty breathing, headache or visual disturbances   Complete by: As directed    Call MD for:  extreme fatigue   Complete by: As directed    Call MD for:  hives   Complete by: As directed    Call MD for:  persistant dizziness or light-headedness   Complete by: As directed    Call MD for:  persistant nausea and vomiting   Complete by: As directed    Call MD for:  redness, tenderness, or signs of infection (pain, swelling, redness, odor or green/yellow discharge around incision site)   Complete by: As directed    Call MD for:  severe uncontrolled pain   Complete by: As directed    Call MD for:  temperature >100.4   Complete by: As directed    Diet general   Complete by: As directed    Discharge instructions   Complete by: As directed    1. Follow up with your primary care provider in 1 week 2. Interventional Radiology will call you with appointment time for Feb 15, 2023 3. Nothing to eat or drink after 11:59 pm on Feb 14, 2023 in preparation for your procedure on Feb 15, 2023 4. Do NOT take anymore Eliquis  until instructed to do so. 5. You may take Tylenol  for any aches/pain/headaches/fevers. 6. Avoid aspirin, ibuprofen, naproxen or other medications in the Non-Steroid Anti-Inflammatory Drugs(NSAIDS) class of medications. Ask your local pharmacist  if any pain medications other then Tylenol  is an NSAIDS.   Increase activity slowly   Complete by: As directed       Allergies as of 02/02/2023       Reactions   Other Hives, Other (See Comments)   Cherry wood just cut- smelled it and broke out; cannot tolerate ANY cherry fragrances, either   Cherry Hives   Wound Dressing Adhesive Rash, Other (See Comments)   Band-Aids = local reaction        Medication List     STOP taking these medications    apixaban  5 MG Tabs tablet Commonly known as: ELIQUIS    omeprazole  40 MG capsule Commonly known as: PRILOSEC        TAKE these medications    acetaminophen  500 MG tablet Commonly known as: TYLENOL  Take 2 tablets (1,000 mg total) by mouth every 8 (eight) hours as needed for moderate pain, headache or fever. What changed: how much to take   buPROPion  300 MG 24 hr tablet Commonly known as: WELLBUTRIN  XL Take 300 mg by mouth daily after breakfast. Takes differently from prescribed: 20mg  BID   busPIRone  10 MG tablet Commonly known as: BUSPAR  Take 20 mg by mouth 2 (two) times daily.   escitalopram  20 MG tablet Commonly known as: LEXAPRO  Take 20 mg by mouth at bedtime.   lactose free nutrition Liqd Take 237 mLs by mouth 2 (two) times daily between meals.   lidocaine -prilocaine  cream Commonly known as: EMLA  Apply a small amount to port a cath site (do not rub in) and cover with plastic wrap 1 hour prior to infusion appointments   Linzess  72 MCG capsule Generic drug: linaclotide  TAKE 1 CAPSULE BY MOUTH DAILY BEFORE BREAKFAST What changed: See the new instructions.   mirtazapine  30 MG tablet Commonly known as: REMERON  Take 15 mg by mouth at bedtime.   ondansetron  4 MG tablet Commonly known as: ZOFRAN  Take 1 tablet (4 mg total) by mouth every 6 (six) hours as needed for nausea or vomiting.   pantoprazole  40 MG tablet Commonly known as: PROTONIX  Take 1 tablet (40 mg total) by mouth 2 (two) times daily.   QUEtiapine  25 MG tablet Commonly known as: SEROQUEL  Take 25 mg by mouth 2 (two) times daily.   Vitamin D3 125 MCG (5000 UT) Caps Take 5,000 Units by mouth every morning.        Follow-up Information     Suttle, Ester PARAS, MD Follow up.   Specialties: Interventional Radiology, Diagnostic Radiology, Radiology Why: Scheduled for thrombectomy/possible TIPS 02/15/23.  Schedulers will contact patient with instructions prior to procedure. Contact information: [address] [place] [address] [phone]                Allergies  Allergen Reactions   Other  Hives and Other (See Comments)    Cherry wood just cut- smelled it and broke out; cannot tolerate ANY cherry fragrances, either      Cherry Hives   Wound Dressing Adhesive Rash and Other (See Comments)    Band-Aids = local reaction    Discharge Exam: Vitals:   02/02/23 0437 02/02/23 0833  BP: 115/67 109/66  Pulse: 77 79  Resp: 19 18  Temp: 98.4 F (36.9 C) (!) 97.3 F (36.3 C)  SpO2: 97% 96%    Physical Exam Vitals and nursing note reviewed.  Constitutional:      General: She is not in acute distress.    Appearance: She is not toxic-appearing.  HENT:     Head: Normocephalic and atraumatic.     Nose: Nose normal.  Cardiovascular:     Rate and Rhythm: Normal rate and regular rhythm.  Pulmonary:     Effort: Pulmonary effort is normal.     Breath sounds: Normal breath sounds.  Abdominal:     General: Abdomen is flat. Bowel sounds are normal. There is no distension.     Palpations: Abdomen is soft.  Musculoskeletal:     Right lower leg: No edema.     Left lower leg: No edema.  Skin:    General: Skin is warm and dry.     Capillary Refill: Capillary refill takes less than 2 seconds.  Neurological:     Mental Status: She is alert and oriented to person, place, and time.     The results of significant diagnostics from this hospitalization (including imaging, microbiology, ancillary and laboratory) are listed below for reference.     Labs:  Basic Metabolic Panel: Recent Labs  Lab 01/29/23 1137 01/30/23 0410 01/31/23 0414 02/02/23 0650  NA 138 139 139 138  K 3.9 4.0 3.9 3.8  CL 108 108 108 106  CO2 25 25 24 24   GLUCOSE 131* 104* 88 98  BUN 30* 19 17 20   CREATININE 0.73 0.73 0.85 0.93  CALCIUM  8.8* 8.5* 8.8* 9.0  MG  --   --  2.4  --    Liver Function Tests: Recent Labs  Lab 01/29/23 1137 02/02/23 0650  AST 47* 33  ALT 65* 43  ALKPHOS 110 105  BILITOT 0.4 0.4  PROT 6.1* 5.6*  ALBUMIN  3.2* 2.9*   Recent Labs  Lab 01/29/23 1137  LIPASE 24    CBC: Recent Labs  Lab 01/29/23 1137 01/29/23 1826 01/30/23 0410 01/30/23 1830 01/31/23 0414 02/02/23 0650  WBC 3.3*  --  2.4*  --  3.4* 3.2*  NEUTROABS 2.4  --   --   --   --  2.0  HGB 8.4* 8.1* 7.3* 10.2* 9.9* 10.3*  HCT 26.7* 25.9* 24.1* 31.2* 31.1* 31.1*  MCV 95.4  --  95.3  --  95.4 92.0  PLT 200  --  193  --  196 168   Urinalysis    Component Value Date/Time   COLORURINE YELLOW 05/06/2022 1225   APPEARANCEUR CLEAR 05/06/2022 1225   LABSPEC 1.017 05/06/2022 1225   PHURINE 5.0 05/06/2022 1225   GLUCOSEU NEGATIVE 05/06/2022 1225   HGBUR NEGATIVE 05/06/2022 1225   BILIRUBINUR NEGATIVE 05/06/2022 1225   KETONESUR NEGATIVE 05/06/2022 1225   PROTEINUR NEGATIVE 05/06/2022 1225   NITRITE NEGATIVE 05/06/2022 1225   LEUKOCYTESUR NEGATIVE 05/06/2022 1225   Sepsis Labs Recent Labs  Lab 01/29/23 1137 01/30/23 0410 01/31/23 0414 02/02/23 0650  WBC 3.3* 2.4* 3.4* 3.2*    Procedures/Studies: ECHOCARDIOGRAM COMPLETE Result Date: 02/01/2023    ECHOCARDIOGRAM REPORT   Patient Name:   KALY MCQUARY Date of Exam: 02/01/2023 Medical Rec #:  [id number]         Height:       62.5 in Accession #:    [id number]        Weight:       132.0 lb Date of Birth:  [birth year]        BSA:          1.612 m Patient Age:    77 years          BP:           133/76 mmHg Patient  Gender: F                 HR:           73 bpm. Exam Location:  Inpatient Procedure: 2D Echo, Color Doppler and Cardiac Doppler Indications:    Pre-op exam (TIPS)  History:        Patient has prior history of Echocardiogram examinations, most                 recent 03/18/2014. Risk Factors:Hypertension.  Sonographer:    Damien Senior RDCS Referring Phys: [id number] TOYA DEL HAN IMPRESSIONS  1. Left ventricular ejection fraction, by estimation, is 55 to 60%. The left ventricle has normal function. The left ventricle has no regional wall motion abnormalities. Left ventricular diastolic parameters are consistent with Grade I diastolic  dysfunction (impaired relaxation).  2. Right ventricular systolic function is normal. The right ventricular size is normal. Tricuspid regurgitation signal is inadequate for assessing PA pressure.  3. A small pericardial effusion is present. The pericardial effusion is anterior to the right ventricle. There is no evidence of cardiac tamponade.  4. The mitral valve is normal in structure. Trivial mitral valve regurgitation. No evidence of mitral stenosis.  5. The aortic valve is tricuspid. There is mild calcification of the aortic valve. Aortic valve regurgitation is trivial. Aortic valve sclerosis is present, with no evidence of aortic valve stenosis.  6. The inferior vena cava is normal in size with greater than 50% respiratory variability, suggesting right atrial pressure of 3 mmHg. FINDINGS  Left Ventricle: Left ventricular ejection fraction, by estimation, is 55 to 60%. The left ventricle has normal function. The left ventricle has no regional wall motion abnormalities. The left ventricular internal cavity size was normal in size. There is  no left ventricular hypertrophy. Left ventricular diastolic parameters are consistent with Grade I diastolic dysfunction (impaired relaxation). Right Ventricle: The right ventricular size is normal. No increase in right ventricular wall thickness. Right ventricular systolic function is normal. Tricuspid regurgitation signal is inadequate for assessing PA pressure. Left Atrium: Left atrial size was normal in size. Right Atrium: Right atrial size was normal in size. Pericardium: A small pericardial effusion is present. The pericardial effusion is anterior to the right ventricle. There is no evidence of cardiac tamponade. Presence of epicardial fat layer. Mitral Valve: The mitral valve is normal in structure. Trivial mitral valve regurgitation. No evidence of mitral valve stenosis. Tricuspid Valve: The tricuspid valve is normal in structure. Tricuspid valve regurgitation is  trivial. No evidence of tricuspid stenosis. Aortic Valve: The aortic valve is tricuspid. There is mild calcification of the aortic valve. Aortic valve regurgitation is trivial. Aortic valve sclerosis is present, with no evidence of aortic valve stenosis. Pulmonic Valve: The pulmonic valve was normal in structure. Pulmonic valve regurgitation is trivial. No evidence of pulmonic stenosis. Aorta: The aortic root and ascending aorta are structurally normal, with no evidence of dilitation. Venous: The inferior vena cava is normal in size with greater than 50% respiratory variability, suggesting right atrial pressure of 3 mmHg. IAS/Shunts: The atrial septum is grossly normal.  LEFT VENTRICLE PLAX 2D LVIDd:         4.10 cm   Diastology LVIDs:         2.90 cm   LV e' medial:    4.68 cm/s LV PW:         0.80 cm   LV E/e' medial:  12.0 LV IVS:  0.70 cm   LV e' lateral:   9.46 cm/s LVOT diam:     2.00 cm   LV E/e' lateral: 5.9 LV SV:         47 LV SV Index:   29 LVOT Area:     3.14 cm  RIGHT VENTRICLE RV S prime:     12.80 cm/s TAPSE (M-mode): 1.9 cm LEFT ATRIUM             Index        RIGHT ATRIUM           Index LA diam:        2.60 cm 1.61 cm/m   RA Area:     11.70 cm LA Vol (A2C):   33.8 ml 20.97 ml/m  RA Volume:   24.50 ml  15.20 ml/m LA Vol (A4C):   35.7 ml 22.15 ml/m LA Biplane Vol: 36.7 ml 22.77 ml/m  AORTIC VALVE LVOT Vmax:   69.80 cm/s LVOT Vmean:  52.400 cm/s LVOT VTI:    0.151 m  AORTA Ao Root diam: 3.10 cm Ao Asc diam:  3.20 cm MITRAL VALVE MV Area (PHT): 2.27 cm    SHUNTS MV E velocity: 56.10 cm/s  Systemic VTI:  0.15 m MV A velocity: 79.70 cm/s  Systemic Diam: 2.00 cm MV E/A ratio:  0.70 Darryle Decent MD Electronically signed by Darryle Decent MD Signature Date/Time: 02/01/2023/10:35:33 AM    Final    CT ABDOMEN PELVIS W CONTRAST Result Date: 01/29/2023 CLINICAL DATA:  Dark red rectal bleeding since last Thursday. Pancreatic cancer. * Tracking Code: BO * EXAM: CT ABDOMEN AND PELVIS WITH CONTRAST  TECHNIQUE: Multidetector CT imaging of the abdomen and pelvis was performed using the standard protocol following bolus administration of intravenous contrast. RADIATION DOSE REDUCTION: This exam was performed according to the departmental dose-optimization program which includes automated exposure control, adjustment of the mA and/or kV according to patient size and/or use of iterative reconstruction technique. CONTRAST:  OMNIPAQUE  IOHEXOL  300 MG/ML  SOLN COMPARISON:  CTA 01/12/2023. Older standard CT scans as well including September 2024. PET-CT 10/28/2022. MRI 10/18/2022. FINDINGS: Lower chest: Linear opacity lung bases likely scar or atelectasis. No pleural effusion. Hepatobiliary: Tiny right-sided dome hepatic cystic focus, unchanged from previous, nonspecific. 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 Pancreas: Severe pancreatic atrophy with ductal dilatation. There is transition point along a low-density mass seen along the head and uncinate process. This mass on series 2, image 27 measures 2.3 by 1.7 cm and is more defined today and larger than previous examinations. This has abuts the SMV. There is some progressive thrombus within the upper SMV which is nonocclusive. Preserved tissue plane to the celiac and SMA. See coronal image 32 of series 4. Spleen: Slightly lobular spleen which is nonenlarged. Adrenals/Urinary Tract: Prominent bilateral parapelvic renal cysts bilaterally. Small parenchymal cysts. No enhancing mass or collecting system dilatation. Preserved contours of the urinary bladder. Stomach/Bowel: Once again there is significant wall thickening and edema along the antrum and pylorus of the stomach. Please correlate for gastritis. There is some increasing adjacent stranding. Stomach is nondilated. Small bowel is nondilated. Large bowel has a normal course and  caliber with some scattered colonic stool. There is slight wall thickening along portion of the hepatic flexure in the porta hepatis with some adjacent increasing fluid. Normal appendix seen in the right  lower quadrant. Vascular/Lymphatic: Aortic atherosclerosis. No enlarged abdominal or pelvic lymph nodes. Reproductive: Uterus and bilateral adnexa are unremarkable. Other: No free intra-air. Musculoskeletal: Streak artifact related to the significant hardware along the lumbar spine. Other areas of advanced degenerative changes along the thoracolumbar region. Degenerative changes of the pelvis. Critical Value/emergent results were called by telephone at the time of interpretation on 01/29/2023 at 1:45 pm to provider [facility] , who verbally acknowledged these results. 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 Again there is a low-density masslike area along the uncinate process with associated severe ductal dilatation in caliber change. This has consistent with patient's history of pancreatic neoplasm. No developing lymph node enlargement or new liver lesion at this time. Progressive wall thickening along the distal stomach towards the antrum and pylorus with the edema. Please correlate for peptic ulcer disease or other process. Increasing adjacent stranding and trace fluid extending towards the porta hepatis. Electronically Signed   By: Ranell Bring M.D.   On: 01/29/2023 13:53   CT ANGIO GI BLEED Result Date: 01/12/2023 CLINICAL DATA:  Abdominal pain, melena, history of pancreatic cancer * Tracking Code: BO * EXAM: CTA ABDOMEN AND PELVIS WITHOUT AND WITH CONTRAST TECHNIQUE: Multidetector CT imaging of the abdomen and pelvis was performed using the standard protocol during bolus administration of intravenous contrast.  Multiplanar reconstructed images and MIPs were obtained and reviewed to evaluate the vascular anatomy. RADIATION DOSE REDUCTION: This exam was performed according to the departmental dose-optimization program which includes automated exposure control, adjustment of the mA and/or kV according to patient size and/or use of iterative reconstruction technique. CONTRAST:  75mL OMNIPAQUE  IOHEXOL  350 MG/ML SOLN COMPARISON:  PET-CT, 10/28/2022, CT abdomen pelvis, 09/30/2022 FINDINGS: VASCULAR Normal contour and caliber of the abdominal aorta. No evidence of aneurysm, dissection, or other acute aortic pathology. Standard branching pattern of the abdominal aorta with solitary bilateral renal arteries. Moderate mixed calcific atherosclerosis. Review of the MIP images confirms the above findings. NON-VASCULAR Lower Chest: No acute findings.  Coronary artery calcifications. Hepatobiliary: No solid liver abnormality is seen. Unchanged subcentimeter low-attenuation no gallstones, gallbladder wall thickening, or biliary dilatation. Pancreas: Unchanged post treatment appearance of the pancreatic head (series 15, image 25). Severe ductal dilatation and pancreatic atrophy distal to the pancreatic head, duct again measuring up to 1.1 cm in caliber (series 15, image 21). Spleen: Normal in size without significant abnormality. Adrenals/Urinary Tract: Adrenal glands are unremarkable. Numerous benign bilateral parapelvic renal cysts, for which no further follow-up or characterization is required. Kidneys are otherwise normal, without renal calculi, solid lesion, or hydronephrosis. Bladder is unremarkable. Stomach/Bowel: Thickening of the gastric antrum and pylorus (series 15, image 21). Status post appendectomy. No evidence of bowel wall thickening, distention, or inflammatory changes. Lymphatic: No enlarged abdominal or pelvic lymph nodes. Reproductive: No mass or other significant abnormality. Other: No abdominal wall hernia or  abnormality. No ascites. Musculoskeletal: No acute osseous findings. IMPRESSION: 1. Thickening of the gastric antrum and pylorus, suggestive of gastritis; as on prior examinations this is possibly secondary to radiation therapy. 2. No intraluminal contrast extravasation or other findings to specifically localize reported GI bleeding. 3. Normal contour and caliber of the abdominal aorta. No evidence of aneurysm, dissection, or other acute aortic pathology. Moderate atherosclerosis. 4. Unchanged post treatment appearance of the pancreatic head. Severe ductal dilatation  and pancreatic atrophy distal to the pancreatic head. 5. Coronary artery disease. Aortic Atherosclerosis (ICD10-I70.0). Electronically Signed   By: Marolyn JONETTA Jaksch M.D.   On: 01/12/2023 14:43    Time coordinating discharge: 45 mins  SIGNED:  Camellia Door, DO [facility] 02/02/23, 10:04 AM

## 2023-02-02 NOTE — Progress Notes (Signed)
 Interventional Radiology Brief Note:  Anticipate thrombectomy with possible TIPS with Dr. Elby Showers 1/21. Patient will hear from schedulers regarding pre-op instructions.  Hold AC for now.   Loyce Dys, MS RD PA-C

## 2023-02-02 NOTE — Progress Notes (Signed)
 PROGRESS NOTE    Sheryl Bender  FMW:[id number] DOB: [birth year] DOA: 01/29/2023 PCP: Lonna Millman, DO  Subjective: Pt seen and examined.  HgB stable 10.3 g/dl  Stable for DC. IR to arrange for thrombectomy next week Feb 09, 2023 with Dr. Jennefer.  Pt to remain off Eliquis  until told to restart Dr. Cindie with GI will arrange for CBC this Monday, Feb 07, 2023   Hospital Course: HPI: Sheryl Bender is a 78 y.o. female with medical history significant for GI bleed, pancreatic cancer, interstitial pancreatitis, hypertension, pulmonary embolism 2022 on chronic anticoagulation. Patient presented to the ED with complaints of bloody stools that started about 2 to 3 days ago.  Recent hospitalizations 12/2 to 12/5 and again 12/18 to 12/21 for GI bleed. During recent hospitalization, patient underwent EGD 5/19-with findings of gastritis with active hemorrhaging treated with APC therapy and bipolar cautery. 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  She was to hold her anticoagulation with Eliquis  and resume 12/28.  On discharge hemoglobin was 8.2.   Patient reports on getting home she had some normal colored stools, reports  dark and red stools, re-occurred about 2 to 3 days ago.  Reports mild upper abdominal pain, no vomiting.  Denies NSAID use.  She decided to hold off on not starting her anticoagulation 12/28.     ED Course: Blood pressure 112-128.  Heart rate 76-90.  Hemoglobin 8.4.  CT abdomen and pelvis with contrast-question of the nonocclusive thrombus in the SMV but most significant thrombus not seen in the portal vein including the main portal vein. low-density masslike area along the uncinate process with associated severe ductal dilatation in caliber change. This has consistent with patient's history of pancreatic neoplasm.  Progressive wall thickening along the distal stomach- co-relate for PUD. EDP talked to GI Dr. Cindie, will see in consult in a.m. for  possible EGD, may need to involve IR.  Significant Events: Admitted 01/29/2023 for GI bleed at [facility] 01/30/2023 EGD Gastritis with hemorrhage. Treated with argon plasma coagulation (APC). Non-bleeding gastric ulcers with no stigmata of bleeding. Duodenitis. 01/31/2023 transferred to [facility] for thrombectomy  Significant Labs: Hemoglobin 8.4   Significant Imaging Studies: CT abdomen and pelvis with contrast-question of the nonocclusive thrombus in the SMV but most significant thrombus not seen in the portal vein including the main portal vein. low-density masslike area along the uncinate process with associated severe ductal dilatation in caliber change. This has consistent with patient's history of pancreatic neoplasm.  Progressive wall thickening along the distal stomach- co-relate for PUD.   Antibiotic Therapy: Anti-infectives (From admission, onward)    None       Procedures: 01/30/2023 EGD Gastritis with hemorrhage. Treated with argon plasma coagulation (APC). Non-bleeding gastric ulcers with no stigmata of bleeding. Duodenitis.  Consultants: GI IR    Assessment and Plan: * GI bleed On admission, 2 hospitalizations in December for GI bleed.  Recently discharged 12/21.  Hemoglobin today 8.4, last hemoglobin 5 days ago-  9.1.  Has not resumed anticoagulation with  Eliquis  since discharge.  CT abdomen and pelvis with- Progressive wall thickening along the distal stomach- co-relate for PUD. - EDP to Dr. Cindie, will see in consult in a.m., IV Protonix  -Clear liquid diet, n.p.o. midnight -trend H&H - D5+ N/s 75cc/hr x 20hrs  02-01-2023 pt has EGD with cauterization of bleeding areas in her stomach on 01-30-2023. Pt transferred to [facility] yesterday to see IR. Unfortunately, the IR physician that wants to perform this procedure will  not be able to get pt's scheduled until next Wednesday. Discussed with her treating GI provider Dr. Cindie who thinks pt can go home and come back to [facility] for  thrombectomy.  He will arrange for repeat CBC next week on Monday. Pt does not have a ride for this evening. Pt can go home tomorrow. Pt will stop systemic anticoagulation for now. Decision on whether to restart anticoagulation will be made after her thrombectomy next week.  02-02-2023 DC to home today. F/u with IR next week for thrombectomy due portal vein thrombosis. Pt to remain off Eliquis  for now.  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 Likely caused by her prior XRT and pancreatic cancer. IR will arrange for outpatient thrombectomy with anesthesia.  02-02-2023 DC to home today. F/u with IR next week for thrombectomy due portal vein thrombosis. Pt to remain off Eliquis  for now.  Current use of long term anticoagulation History of PE 2022.  On anticoagulation with Eliquis .  CT findings today showing-progression of nonocclusive thrombus in the SMV but more significant thrombus now seen in the portal vein including the main portal vein, as well as portions of the portal vein and the liver particularly segment 4. -Holding anticoagulation for now in the setting of GI bleed.  02-02-2023 DC to home today. F/u with IR next week for thrombectomy due portal vein thrombosis. Pt to remain off Eliquis  for now.  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   DVT prophylaxis: SCDs Start: 01/29/23 1810     Code Status: Full Code Family Communication: no family at bedside. Pt is decisional Disposition Plan: return home Reason for continuing need for hospitalization: medically stable for DC.  Objective: Vitals:   02/01/23 1956 02/02/23 0020 02/02/23 0437 02/02/23 0833  BP: 114/69 (!) 94/51 115/67 109/66  Pulse: 79 78 77 79  Resp: 19 19 19 18   Temp: 99.1 F (37.3 C) 98.5 F (36.9 C) 98.4 F (36.9 C) (!) 97.3 F (36.3 C)  TempSrc: Oral Oral Oral Oral  SpO2: 97% 96% 97% 96%  Weight:      Height:         Intake/Output Summary (Last 24 hours) at 02/02/2023 0925 Last data filed at 02/02/2023 0817 Gross per 24 hour  Intake 240 ml  Output --  Net 240 ml   Filed Weights   01/29/23 1053  Weight: 59.9 kg    Examination:  Physical Exam Vitals and nursing note reviewed.  Constitutional:      General: She is not in acute distress.    Appearance: She is not toxic-appearing.  HENT:     Head: Normocephalic and atraumatic.     Nose: Nose normal.  Cardiovascular:     Rate and Rhythm: Normal rate and regular rhythm.  Pulmonary:     Effort: Pulmonary effort is normal.     Breath sounds: Normal breath sounds.  Abdominal:     General: Abdomen is flat. Bowel sounds are normal. There is no distension.     Palpations: Abdomen is soft.  Musculoskeletal:     Right lower leg: No edema.     Left lower leg: No edema.  Skin:    General: Skin is warm and dry.     Capillary Refill: Capillary refill takes less than 2 seconds.  Neurological:     Mental Status: She is alert and oriented to person, place, and time.     Data Reviewed: I have personally reviewed  following labs and imaging studies  CBC: Recent Labs  Lab 01/29/23 1137 01/29/23 1826 01/30/23 0410 01/30/23 1830 01/31/23 0414 02/02/23 0650  WBC 3.3*  --  2.4*  --  3.4* 3.2*  NEUTROABS 2.4  --   --   --   --  2.0  HGB 8.4* 8.1* 7.3* 10.2* 9.9* 10.3*  HCT 26.7* 25.9* 24.1* 31.2* 31.1* 31.1*  MCV 95.4  --  95.3  --  95.4 92.0  PLT 200  --  193  --  196 168   Basic Metabolic Panel: Recent Labs  Lab 01/29/23 1137 01/30/23 0410 01/31/23 0414 02/02/23 0650  NA 138 139 139 138  K 3.9 4.0 3.9 3.8  CL 108 108 108 106  CO2 25 25 24 24   GLUCOSE 131* 104* 88 98  BUN 30* 19 17 20   CREATININE 0.73 0.73 0.85 0.93  CALCIUM  8.8* 8.5* 8.8* 9.0  MG  --   --  2.4  --    GFR: Estimated Creatinine Clearance: 39.7 mL/min (by C-G formula based on SCr of 0.93 mg/dL). Liver Function Tests: Recent Labs  Lab 01/29/23 1137 02/02/23 0650   AST 47* 33  ALT 65* 43  ALKPHOS 110 105  BILITOT 0.4 0.4  PROT 6.1* 5.6*  ALBUMIN  3.2* 2.9*   Recent Labs  Lab 01/29/23 1137  LIPASE 24   Coagulation Profile: Recent Labs  Lab 01/29/23 1137  INR 0.9    Radiology Studies: ECHOCARDIOGRAM COMPLETE Result Date: 02/01/2023    ECHOCARDIOGRAM REPORT   Patient Name:   JAZAE GANDOLFI Date of Exam: 02/01/2023 Medical Rec #:  [id number]         Height:       62.5 in Accession #:    [id number]        Weight:       132.0 lb Date of Birth:  [birth year]        BSA:          1.612 m Patient Age:    77 years          BP:           133/76 mmHg Patient Gender: F                 HR:           73 bpm. Exam Location:  Inpatient Procedure: 2D Echo, Color Doppler and Cardiac Doppler Indications:    Pre-op exam (TIPS)  History:        Patient has prior history of Echocardiogram examinations, most                 recent 03/18/2014. Risk Factors:Hypertension.  Sonographer:    Damien Senior RDCS Referring Phys: [id number] TOYA DEL HAN IMPRESSIONS  1. Left ventricular ejection fraction, by estimation, is 55 to 60%. The left ventricle has normal function. The left ventricle has no regional wall motion abnormalities. Left ventricular diastolic parameters are consistent with Grade I diastolic dysfunction (impaired relaxation).  2. Right ventricular systolic function is normal. The right ventricular size is normal. Tricuspid regurgitation signal is inadequate for assessing PA pressure.  3. A small pericardial effusion is present. The pericardial effusion is anterior to the right ventricle. There is no evidence of cardiac tamponade.  4. The mitral valve is normal in structure. Trivial mitral valve regurgitation. No evidence of mitral stenosis.  5. The aortic valve is tricuspid. There is mild calcification of the aortic valve. Aortic valve regurgitation is trivial. Aortic valve  sclerosis is present, with no evidence of aortic valve stenosis.  6. The inferior vena cava is normal in  size with greater than 50% respiratory variability, suggesting right atrial pressure of 3 mmHg. FINDINGS  Left Ventricle: Left ventricular ejection fraction, by estimation, is 55 to 60%. The left ventricle has normal function. The left ventricle has no regional wall motion abnormalities. The left ventricular internal cavity size was normal in size. There is  no left ventricular hypertrophy. Left ventricular diastolic parameters are consistent with Grade I diastolic dysfunction (impaired relaxation). Right Ventricle: The right ventricular size is normal. No increase in right ventricular wall thickness. Right ventricular systolic function is normal. Tricuspid regurgitation signal is inadequate for assessing PA pressure. Left Atrium: Left atrial size was normal in size. Right Atrium: Right atrial size was normal in size. Pericardium: A small pericardial effusion is present. The pericardial effusion is anterior to the right ventricle. There is no evidence of cardiac tamponade. Presence of epicardial fat layer. Mitral Valve: The mitral valve is normal in structure. Trivial mitral valve regurgitation. No evidence of mitral valve stenosis. Tricuspid Valve: The tricuspid valve is normal in structure. Tricuspid valve regurgitation is trivial. No evidence of tricuspid stenosis. Aortic Valve: The aortic valve is tricuspid. There is mild calcification of the aortic valve. Aortic valve regurgitation is trivial. Aortic valve sclerosis is present, with no evidence of aortic valve stenosis. Pulmonic Valve: The pulmonic valve was normal in structure. Pulmonic valve regurgitation is trivial. No evidence of pulmonic stenosis. Aorta: The aortic root and ascending aorta are structurally normal, with no evidence of dilitation. Venous: The inferior vena cava is normal in size with greater than 50% respiratory variability, suggesting right atrial pressure of 3 mmHg. IAS/Shunts: The atrial septum is grossly normal.  LEFT VENTRICLE PLAX 2D  LVIDd:         4.10 cm   Diastology LVIDs:         2.90 cm   LV e' medial:    4.68 cm/s LV PW:         0.80 cm   LV E/e' medial:  12.0 LV IVS:        0.70 cm   LV e' lateral:   9.46 cm/s LVOT diam:     2.00 cm   LV E/e' lateral: 5.9 LV SV:         47 LV SV Index:   29 LVOT Area:     3.14 cm  RIGHT VENTRICLE RV S prime:     12.80 cm/s TAPSE (M-mode): 1.9 cm LEFT ATRIUM             Index        RIGHT ATRIUM           Index LA diam:        2.60 cm 1.61 cm/m   RA Area:     11.70 cm LA Vol (A2C):   33.8 ml 20.97 ml/m  RA Volume:   24.50 ml  15.20 ml/m LA Vol (A4C):   35.7 ml 22.15 ml/m LA Biplane Vol: 36.7 ml 22.77 ml/m  AORTIC VALVE LVOT Vmax:   69.80 cm/s LVOT Vmean:  52.400 cm/s LVOT VTI:    0.151 m  AORTA Ao Root diam: 3.10 cm Ao Asc diam:  3.20 cm MITRAL VALVE MV Area (PHT): 2.27 cm    SHUNTS MV E velocity: 56.10 cm/s  Systemic VTI:  0.15 m MV A velocity: 79.70 cm/s  Systemic Diam: 2.00 cm MV E/A ratio:  0.70 Darryle Decent MD Electronically signed by Darryle Decent MD Signature Date/Time: 02/01/2023/10:35:33 AM    Final     Scheduled Meds:  buPROPion   300 mg Oral QPC breakfast   busPIRone   20 mg Oral BID   calcium  carbonate  1 tablet Oral TID WC   escitalopram   20 mg Oral QHS   mirtazapine   15 mg Oral QHS   pantoprazole   40 mg Oral BID   QUEtiapine   25 mg Oral BID   Continuous Infusions:   LOS: 4 days   Time spent: 40 minutes  Camellia Door, DO  [facility]  02/02/2023, 9:25 AM

## 2023-02-03 ENCOUNTER — Telehealth (HOSPITAL_COMMUNITY): Payer: Self-pay

## 2023-02-03 ENCOUNTER — Encounter: Payer: Self-pay | Admitting: Gastroenterology

## 2023-02-03 ENCOUNTER — Ambulatory Visit (INDEPENDENT_AMBULATORY_CARE_PROVIDER_SITE_OTHER): Payer: Medicare Other | Admitting: Gastroenterology

## 2023-02-03 ENCOUNTER — Other Ambulatory Visit: Payer: Self-pay

## 2023-02-03 VITALS — BP 131/76 | HR 128 | Temp 96.9°F | Ht 62.0 in | Wt 129.2 lb

## 2023-02-03 DIAGNOSIS — K922 Gastrointestinal hemorrhage, unspecified: Secondary | ICD-10-CM | POA: Diagnosis not present

## 2023-02-03 DIAGNOSIS — Z8719 Personal history of other diseases of the digestive system: Secondary | ICD-10-CM

## 2023-02-03 DIAGNOSIS — K55069 Acute infarction of intestine, part and extent unspecified: Secondary | ICD-10-CM | POA: Diagnosis not present

## 2023-02-03 DIAGNOSIS — D649 Anemia, unspecified: Secondary | ICD-10-CM | POA: Diagnosis not present

## 2023-02-03 DIAGNOSIS — I81 Portal vein thrombosis: Secondary | ICD-10-CM

## 2023-02-03 NOTE — Patient Instructions (Addendum)
 Please have blood work completed at [facility].   If you have any recurrent black tarry stools or bright red blood per rectum, I recommend that you return to [facility] rather than any pain if you feel safe to travel that far.  Continue pantoprazole  40 mg twice daily.  Continue to avoid all NSAID products.   Josette Centers, PA-C [facility]

## 2023-02-03 NOTE — Telephone Encounter (Signed)
 Called pt to give contact info for if she needs to reschedule her mri since she will be getting out of the hospital the day before from having her TIPS procedure. No answer, left vm with [phone] and [phone].

## 2023-02-04 ENCOUNTER — Encounter (HOSPITAL_COMMUNITY): Payer: Self-pay | Admitting: Internal Medicine

## 2023-02-04 ENCOUNTER — Other Ambulatory Visit: Payer: Self-pay

## 2023-02-04 LAB — CBC WITH DIFFERENTIAL/PLATELET
Basophils Absolute: 0 10*3/uL (ref 0.0–0.2)
Basos: 0 %
EOS (ABSOLUTE): 0 10*3/uL (ref 0.0–0.4)
Eos: 1 %
Hematocrit: 33.5 % — ABNORMAL LOW (ref 34.0–46.6)
Hemoglobin: 10.4 g/dL — ABNORMAL LOW (ref 11.1–15.9)
Immature Grans (Abs): 0 10*3/uL (ref 0.0–0.1)
Immature Granulocytes: 0 %
Lymphocytes Absolute: 0.9 10*3/uL (ref 0.7–3.1)
Lymphs: 22 %
MCH: 29.5 pg (ref 26.6–33.0)
MCHC: 31 g/dL — ABNORMAL LOW (ref 31.5–35.7)
MCV: 95 fL (ref 79–97)
Monocytes Absolute: 0.5 10*3/uL (ref 0.1–0.9)
Monocytes: 13 %
Neutrophils Absolute: 2.7 10*3/uL (ref 1.4–7.0)
Neutrophils: 64 %
Platelets: 208 10*3/uL (ref 150–450)
RBC: 3.52 x10E6/uL — ABNORMAL LOW (ref 3.77–5.28)
RDW: 15.2 % (ref 11.7–15.4)
WBC: 4.1 10*3/uL (ref 3.4–10.8)

## 2023-02-06 ENCOUNTER — Encounter (HOSPITAL_COMMUNITY): Payer: Self-pay | Admitting: *Deleted

## 2023-02-06 ENCOUNTER — Other Ambulatory Visit: Payer: Self-pay

## 2023-02-06 ENCOUNTER — Inpatient Hospital Stay (HOSPITAL_COMMUNITY)
Admission: EM | Admit: 2023-02-06 | Discharge: 2023-02-13 | DRG: 405 | Disposition: A | Discharge status: Home / Self Care | Payer: Medicare Other | Attending: Internal Medicine | Admitting: Internal Medicine

## 2023-02-06 ENCOUNTER — Emergency Department (HOSPITAL_COMMUNITY): Payer: Medicare Other

## 2023-02-06 DIAGNOSIS — Z981 Arthrodesis status: Secondary | ICD-10-CM

## 2023-02-06 DIAGNOSIS — K551 Chronic vascular disorders of intestine: Secondary | ICD-10-CM | POA: Diagnosis not present

## 2023-02-06 DIAGNOSIS — D638 Anemia in other chronic diseases classified elsewhere: Secondary | ICD-10-CM | POA: Diagnosis present

## 2023-02-06 DIAGNOSIS — Z515 Encounter for palliative care: Secondary | ICD-10-CM

## 2023-02-06 DIAGNOSIS — Z923 Personal history of irradiation: Secondary | ICD-10-CM

## 2023-02-06 DIAGNOSIS — K2961 Other gastritis with bleeding: Secondary | ICD-10-CM | POA: Diagnosis present

## 2023-02-06 DIAGNOSIS — K2901 Acute gastritis with bleeding: Secondary | ICD-10-CM

## 2023-02-06 DIAGNOSIS — Z91048 Other nonmedicinal substance allergy status: Secondary | ICD-10-CM

## 2023-02-06 DIAGNOSIS — Z66 Do not resuscitate: Secondary | ICD-10-CM | POA: Diagnosis not present

## 2023-02-06 DIAGNOSIS — Z7901 Long term (current) use of anticoagulants: Secondary | ICD-10-CM

## 2023-02-06 DIAGNOSIS — K922 Gastrointestinal hemorrhage, unspecified: Principal | ICD-10-CM | POA: Diagnosis present

## 2023-02-06 DIAGNOSIS — K55069 Acute infarction of intestine, part and extent unspecified: Secondary | ICD-10-CM | POA: Diagnosis present

## 2023-02-06 DIAGNOSIS — Y842 Radiological procedure and radiotherapy as the cause of abnormal reaction of the patient, or of later complication, without mention of misadventure at the time of the procedure: Secondary | ICD-10-CM | POA: Diagnosis present

## 2023-02-06 DIAGNOSIS — K449 Diaphragmatic hernia without obstruction or gangrene: Secondary | ICD-10-CM | POA: Diagnosis present

## 2023-02-06 DIAGNOSIS — K921 Melena: Secondary | ICD-10-CM | POA: Diagnosis not present

## 2023-02-06 DIAGNOSIS — Z91018 Allergy to other foods: Secondary | ICD-10-CM

## 2023-02-06 DIAGNOSIS — T66XXXA Radiation sickness, unspecified, initial encounter: Secondary | ICD-10-CM | POA: Diagnosis present

## 2023-02-06 DIAGNOSIS — C259 Malignant neoplasm of pancreas, unspecified: Secondary | ICD-10-CM | POA: Diagnosis present

## 2023-02-06 DIAGNOSIS — Z9221 Personal history of antineoplastic chemotherapy: Secondary | ICD-10-CM

## 2023-02-06 DIAGNOSIS — Z8507 Personal history of malignant neoplasm of pancreas: Secondary | ICD-10-CM | POA: Diagnosis not present

## 2023-02-06 DIAGNOSIS — K219 Gastro-esophageal reflux disease without esophagitis: Secondary | ICD-10-CM | POA: Diagnosis present

## 2023-02-06 DIAGNOSIS — F419 Anxiety disorder, unspecified: Secondary | ICD-10-CM | POA: Diagnosis present

## 2023-02-06 DIAGNOSIS — D509 Iron deficiency anemia, unspecified: Secondary | ICD-10-CM | POA: Diagnosis not present

## 2023-02-06 DIAGNOSIS — Z86718 Personal history of other venous thrombosis and embolism: Secondary | ICD-10-CM

## 2023-02-06 DIAGNOSIS — K635 Polyp of colon: Secondary | ICD-10-CM | POA: Diagnosis present

## 2023-02-06 DIAGNOSIS — I81 Portal vein thrombosis: Secondary | ICD-10-CM | POA: Diagnosis not present

## 2023-02-06 DIAGNOSIS — K766 Portal hypertension: Secondary | ICD-10-CM | POA: Diagnosis present

## 2023-02-06 DIAGNOSIS — D62 Acute posthemorrhagic anemia: Secondary | ICD-10-CM | POA: Diagnosis present

## 2023-02-06 DIAGNOSIS — K55059 Acute (reversible) ischemia of intestine, part and extent unspecified: Secondary | ICD-10-CM | POA: Diagnosis present

## 2023-02-06 DIAGNOSIS — Z86711 Personal history of pulmonary embolism: Secondary | ICD-10-CM | POA: Diagnosis not present

## 2023-02-06 DIAGNOSIS — K55019 Acute (reversible) ischemia of small intestine, extent unspecified: Secondary | ICD-10-CM | POA: Diagnosis not present

## 2023-02-06 DIAGNOSIS — Z7189 Other specified counseling: Secondary | ICD-10-CM | POA: Diagnosis not present

## 2023-02-06 DIAGNOSIS — F32A Depression, unspecified: Secondary | ICD-10-CM | POA: Diagnosis present

## 2023-02-06 DIAGNOSIS — K3182 Dieulafoy lesion (hemorrhagic) of stomach and duodenum: Secondary | ICD-10-CM | POA: Diagnosis present

## 2023-02-06 DIAGNOSIS — Z9109 Other allergy status, other than to drugs and biological substances: Secondary | ICD-10-CM

## 2023-02-06 DIAGNOSIS — K2980 Duodenitis without bleeding: Secondary | ICD-10-CM | POA: Diagnosis present

## 2023-02-06 DIAGNOSIS — D696 Thrombocytopenia, unspecified: Secondary | ICD-10-CM | POA: Diagnosis not present

## 2023-02-06 DIAGNOSIS — F418 Other specified anxiety disorders: Secondary | ICD-10-CM | POA: Diagnosis not present

## 2023-02-06 DIAGNOSIS — Z79899 Other long term (current) drug therapy: Secondary | ICD-10-CM

## 2023-02-06 DIAGNOSIS — K31811 Angiodysplasia of stomach and duodenum with bleeding: Secondary | ICD-10-CM | POA: Diagnosis present

## 2023-02-06 DIAGNOSIS — R58 Hemorrhage, not elsewhere classified: Secondary | ICD-10-CM | POA: Insufficient documentation

## 2023-02-06 DIAGNOSIS — K3189 Other diseases of stomach and duodenum: Secondary | ICD-10-CM | POA: Diagnosis present

## 2023-02-06 DIAGNOSIS — K259 Gastric ulcer, unspecified as acute or chronic, without hemorrhage or perforation: Secondary | ICD-10-CM | POA: Diagnosis present

## 2023-02-06 DIAGNOSIS — R195 Other fecal abnormalities: Secondary | ICD-10-CM | POA: Diagnosis not present

## 2023-02-06 DIAGNOSIS — K254 Chronic or unspecified gastric ulcer with hemorrhage: Secondary | ICD-10-CM | POA: Diagnosis not present

## 2023-02-06 DIAGNOSIS — Z9851 Tubal ligation status: Secondary | ICD-10-CM

## 2023-02-06 DIAGNOSIS — I10 Essential (primary) hypertension: Secondary | ICD-10-CM | POA: Diagnosis present

## 2023-02-06 LAB — COMPREHENSIVE METABOLIC PANEL
ALT: 40 U/L (ref 0–44)
AST: 34 U/L (ref 15–41)
Albumin: 3 g/dL — ABNORMAL LOW (ref 3.5–5.0)
Alkaline Phosphatase: 110 U/L (ref 38–126)
Anion gap: 8 (ref 5–15)
BUN: 24 mg/dL — ABNORMAL HIGH (ref 8–23)
CO2: 21 mmol/L — ABNORMAL LOW (ref 22–32)
Calcium: 9 mg/dL (ref 8.9–10.3)
Chloride: 111 mmol/L (ref 98–111)
Creatinine, Ser: 0.82 mg/dL (ref 0.44–1.00)
GFR, Estimated: >60 mL/min (ref >60)
Glucose, Bld: 155 mg/dL — ABNORMAL HIGH (ref 70–99)
Potassium: 3.8 mmol/L (ref 3.5–5.1)
Sodium: 140 mmol/L (ref 135–145)
Total Bilirubin: 0.4 mg/dL (ref 0.0–1.2)
Total Protein: 5.8 g/dL — ABNORMAL LOW (ref 6.5–8.1)

## 2023-02-06 LAB — CBC
HCT: 29.5 % — ABNORMAL LOW (ref 36.0–46.0)
Hemoglobin: 9.2 g/dL — ABNORMAL LOW (ref 12.0–15.0)
MCH: 29.8 pg (ref 26.0–34.0)
MCHC: 31.2 g/dL (ref 30.0–36.0)
MCV: 95.5 fL (ref 80.0–100.0)
Platelets: 168 10*3/uL (ref 150–400)
RBC: 3.09 MIL/uL — ABNORMAL LOW (ref 3.87–5.11)
RDW: 16.5 % — ABNORMAL HIGH (ref 11.5–15.5)
WBC: 4.1 10*3/uL (ref 4.0–10.5)
nRBC: 0 % (ref 0.0–0.2)

## 2023-02-06 LAB — TYPE AND SCREEN
ABO/RH(D): A POS
Antibody Screen: NEGATIVE

## 2023-02-06 LAB — POC OCCULT BLOOD, ED: Fecal Occult Bld: POSITIVE — AB

## 2023-02-06 LAB — LIPASE, BLOOD: Lipase: 22 U/L (ref 11–51)

## 2023-02-06 MED ORDER — DEXTROSE-SODIUM CHLORIDE 5-0.45 % IV SOLN
INTRAVENOUS | Status: DC
Start: 2023-02-06 — End: 2023-02-07

## 2023-02-06 MED ORDER — SALINE SPRAY 0.65 % NA SOLN
1.0000 | NASAL | Status: DC | PRN
  Administered 2023-02-07: 1 via NASAL
  Filled 2023-02-06: qty 44

## 2023-02-06 MED ORDER — SODIUM CHLORIDE 0.9 % IV BOLUS
500.0000 mL | Freq: Once | INTRAVENOUS | Status: AC
  Administered 2023-02-06: 500 mL via INTRAVENOUS

## 2023-02-06 MED ORDER — PANTOPRAZOLE SODIUM 40 MG IV SOLR
40.0000 mg | Freq: Two times a day (BID) | INTRAVENOUS | Status: DC
Start: 2023-02-07 — End: 2023-02-13
  Administered 2023-02-07 – 2023-02-13 (×12): 40 mg via INTRAVENOUS
  Filled 2023-02-06 (×14): qty 10

## 2023-02-06 MED ORDER — PANTOPRAZOLE SODIUM 40 MG IV SOLR
40.0000 mg | INTRAVENOUS | Status: AC
  Filled 2023-02-06: qty 10

## 2023-02-06 MED ORDER — IOHEXOL 350 MG/ML SOLN
75.0000 mL | Freq: Once | INTRAVENOUS | Status: AC | PRN
  Administered 2023-02-06: 75 mL via INTRAVENOUS

## 2023-02-06 MED ORDER — PANTOPRAZOLE SODIUM 40 MG IV SOLR
40.0000 mg | INTRAVENOUS | Status: AC
  Administered 2023-02-06 (×2): 40 mg via INTRAVENOUS

## 2023-02-06 NOTE — Assessment & Plan Note (Addendum)
 CT abdomen shows progressive thrombus in SMV into portal vein.  ?clot vs tumor.    The current plan is to plan for TIPS/thrombectomy during this hospitalization with the goal of lowering portal system pressure (to reduce GAVE bleeding), and simultaneously confirm clot vs tumor.  Discussed with Dr. Adele, IR.  TIPS and thrombectomy/biopsy would be a heroic measure with uncertain benefit. Discussed with Dr. Rogers, Oncology.  Currently, patient is not on active cancer treatment given no confirmed progression of disease AND poor functional status.  Even if biopsy of SMV showed tumor thrombus, her functional status makes her not a candidate for chemo at present.  - Consult IR - Consult Palliative Care - Hold [facility]

## 2023-02-06 NOTE — ED Notes (Signed)
 Patient is resting comfortably.

## 2023-02-06 NOTE — ED Notes (Signed)
 Port accessed by Dwana Melena , RN at this time. Dressing clean, dry, and intact.

## 2023-02-06 NOTE — Consult Note (Addendum)
 Consultation  Referring Provider:  [facility]  Primary Care Physician:  Lonna Millman, DO Primary Gastroenterologist:  [facility]    Reason for Consultation:     acute on chronic anemia  LOS: 0 days          HPI:   Sheryl Bender is a 78 y.o. 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  Patient has had recent multiple admissions for recurrent anemia and multiple procedures.  Admission 12/29/2022 for recurrent upper GI bleeding.  Underwent EGD/colonoscopy (Dr. Shaaron) which showed 2 antral Diealfoy's with active bleeding s/p gold probe and clipped.  2 other small areas of slight oozing in the prepyloric mucosa sealed with gold probe.  Colonoscopy showed small polyp in sigmoid and small amount of old blood in cecum with no active bleeding.  Had another EGD 01/13/2023 (Dr. Cinderella) with gastritis with active bleeding s/p APC and bipolar cautery.  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  She then underwent repeat EGD 01/30/2023 (Dr. Cindie) which showed gastritis with hemorrhage s/p APC, nonbleeding gastric ulcers, duodenitis.  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  Patient is scheduled to see IR 02/15/2023 for discussion of TIPS approach for portal thrombectomy.  Recently seen by [facility] PA [facility] 02/03/2023 who recommended if recurrent bleeding proceed to [facility] for further  evaluation.  Patient presents today with melena and drift in hemoglobin. Hgb 9.2 (10.4 3 days prior) BUN 24, CR 0.82, GFR greater than 60 Normal LFTs Lipase 22 Fecal occult positive  Patient states she was only home a couple days from the hospital before recurrent GI bleed came back.  She notes melena/reddish stools.  She also notes nausea, no vomiting.  Generalized abdominal pain as well that is at a 3 out of 10.   PREVIOUS GI WORKUP   Colonoscopy 12/29/22 with 6 mm polyp in the mid sigmoid colon removed, minimal amount of old blood in the cecum with no actively bleeding lesions anywhere in the lower GI tract.   EGD 12/29/22 showing 2 antral Dielafoy's with active bleeding status post gold probe and clipped. Two other small areas with slight oozing in immediate prepyloric mucosa sealed with gold probe.    EGD 01/13/23 showing gastritis with active hemorrhaging treated with APC therapy and bipolar cautery. 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    EGD 01/30/2023 showing gastritis with hemorrhage s/p APC, nonbleeding gastric ulcers, duodenitis.  Past Medical History:  Diagnosis Date  . Anxiety   . Arthritis   . Depression   . Dysrhythmia    hx palpitations greater than 5 yrs -neg echo, stress per pt ? where or dr  . GERD (gastroesophageal reflux disease)    occ  . Nausea & vomiting 08/24/2021  . Pancreatic adenocarcinoma (HCC)   . Pancreatic pseudocyst   . Port-A-Cath in place 09/15/2021  . Pulmonary embolism [facility])    September 2022    Surgical History:  She  has a past surgical  history that includes Tubal ligation (1974); Anterior lat lumbar fusion (Right, 12/16/2015); Lumbar percutaneous pedicle screw 3 level (Bilateral, 12/16/2015); EUS (N/A, 07/09/2021); Esophagogastroduodenoscopy (N/A, 07/09/2021); biopsy (07/09/2021); Fine needle aspiration (07/09/2021); EUS (N/A, 08/27/2021); Esophagogastroduodenoscopy (egd) with propofol  (N/A, 08/27/2021); Cyst gastrostomy  (08/27/2021); pancreatic stent placement (08/27/2021); Balloon dilation (N/A, 08/27/2021); IR IMAGING GUIDED PORT INSERTION (09/14/2021); Esophagogastroduodenoscopy (egd) with propofol  (N/A, 10/08/2021); Stent removal (10/08/2021); Whipple procedure (N/A, 02/15/2022); laparoscopy (N/A, 02/15/2022); Colonoscopy with propofol  (N/A, 12/29/2022); Esophagogastroduodenoscopy (egd) with propofol  (N/A, 12/29/2022); Hot hemostasis (12/29/2022); Hemostasis clip placement (12/29/2022); Polypectomy (12/29/2022); Esophagogastroduodenoscopy (egd) with propofol  (N/A, 01/13/2023); Hot hemostasis (01/13/2023); Esophagogastroduodenoscopy (egd) with propofol  (N/A, 01/30/2023); and Hot hemostasis (01/30/2023). Family History:  Her family history includes Pancreatitis in her brother. Social History:   reports that she has never smoked. She has never used smokeless tobacco. She reports that she does not drink alcohol  and does not use drugs.  Prior to Admission medications   Medication Sig Start Date End Date Taking? Authorizing Provider  acetaminophen  (TYLENOL ) 500 MG tablet Take 2 tablets (1,000 mg total) by mouth every 8 (eight) hours as needed for moderate pain, headache or fever. Patient taking differently: Take 750-1,000 mg by mouth every 8 (eight) hours as needed for moderate pain (pain score 4-6), headache or fever. 07/21/21  Yes Ricky Fines, MD  buPROPion  (WELLBUTRIN  XL) 300 MG 24 hr tablet Take 300 mg by mouth daily after breakfast. Takes differently from prescribed: 20mg  BID 06/15/21  Yes [provider]  busPIRone  (BUSPAR ) 10 MG tablet Take 20 mg by mouth 2 (two) times daily. 01/27/21  Yes [provider]  Cholecalciferol  (VITAMIN D3) 125 MCG (5000 UT) CAPS Take 5,000 Units by mouth every morning.   Yes [provider]  escitalopram  (LEXAPRO ) 20 MG tablet Take 20 mg by mouth at bedtime.   Yes [provider]  lactose free nutrition (BOOST) LIQD Take 237 mLs by mouth 2 (two) times daily between  meals.   Yes [provider]  LINZESS  72 MCG capsule TAKE 1 CAPSULE BY MOUTH DAILY BEFORE BREAKFAST Patient taking differently: Take 72 mcg by mouth daily before breakfast. 01/20/23  Yes Mansouraty, Aloha Raddle., MD  mirtazapine  (REMERON ) 30 MG tablet Take 15 mg by mouth at bedtime.   Yes [provider]  pantoprazole  (PROTONIX ) 40 MG tablet Take 1 tablet (40 mg total) by mouth 2 (two) times daily. 02/02/23 05/03/23 Yes Laurence Locus, DO  QUEtiapine  (SEROQUEL ) 25 MG tablet Take 25 mg by mouth 2 (two) times daily. 12/14/22  Yes [provider]  lidocaine -prilocaine  (EMLA ) cream Apply a small amount to port a cath site (do not rub in) and cover with plastic wrap 1 hour prior to infusion appointments 09/15/21   Rogers Hai, MD  ondansetron  (ZOFRAN ) 4 MG tablet Take 1 tablet (4 mg total) by mouth every 6 (six) hours as needed for nausea or vomiting. 02/02/23 03/04/23  Laurence Locus, DO    Current Facility-Administered Medications  Medication Dose Route Frequency Provider Last Rate Last Admin  . [START ON 02/07/2023] pantoprazole  (PROTONIX ) injection 40 mg  40 mg Intravenous Q12H Kingsley, Victoria K, DO      . pantoprazole  (PROTONIX ) injection 40 mg  40 mg Intravenous Q5 min Kingsley, Victoria K, DO       Current Outpatient Medications  Medication Sig Dispense Refill  . acetaminophen  (TYLENOL ) 500 MG tablet Take 2 tablets (1,000 mg total) by mouth every 8 (eight) hours as needed for moderate pain, headache or fever. (Patient taking differently: Take  750-1,000 mg by mouth every 8 (eight) hours as needed for moderate pain (pain score 4-6), headache or fever.)    . buPROPion  (WELLBUTRIN  XL) 300 MG 24 hr tablet Take 300 mg by mouth daily after breakfast. Takes differently from prescribed: 20mg  BID    . busPIRone  (BUSPAR ) 10 MG tablet Take 20 mg by mouth 2 (two) times daily.    . Cholecalciferol  (VITAMIN D3) 125 MCG (5000 UT) CAPS Take 5,000 Units by mouth every morning.    .  escitalopram  (LEXAPRO ) 20 MG tablet Take 20 mg by mouth at bedtime.    . lactose free nutrition (BOOST) LIQD Take 237 mLs by mouth 2 (two) times daily between meals.    . LINZESS  72 MCG capsule TAKE 1 CAPSULE BY MOUTH DAILY BEFORE BREAKFAST (Patient taking differently: Take 72 mcg by mouth daily before breakfast.) 30 capsule 0  . mirtazapine  (REMERON ) 30 MG tablet Take 15 mg by mouth at bedtime.    . pantoprazole  (PROTONIX ) 40 MG tablet Take 1 tablet (40 mg total) by mouth 2 (two) times daily. 180 tablet 0  . QUEtiapine  (SEROQUEL ) 25 MG tablet Take 25 mg by mouth 2 (two) times daily.    . lidocaine -prilocaine  (EMLA ) cream Apply a small amount to port a cath site (do not rub in) and cover with plastic wrap 1 hour prior to infusion appointments 30 g 3  . ondansetron  (ZOFRAN ) 4 MG tablet Take 1 tablet (4 mg total) by mouth every 6 (six) hours as needed for nausea or vomiting. 30 tablet 0    Allergies as of 02/06/2023 - Review Complete 02/06/2023  Allergen Reaction Noted  . Other Hives and Other (See Comments) 12/08/2015  . Cherry Hives 03/30/2021  . Wound dressing adhesive Rash and Other (See Comments) 12/08/2015    Review of Systems  Constitutional:  Negative for chills, fever and weight loss.  HENT:  Negative for hearing loss and tinnitus.   Eyes:  Negative for blurred vision and double vision.  Respiratory:  Negative for cough and hemoptysis.   Cardiovascular:  Negative for chest pain and palpitations.  Gastrointestinal:  Positive for abdominal pain, blood in stool, melena and nausea. Negative for constipation, diarrhea, heartburn and vomiting.  Genitourinary:  Negative for dysuria and urgency.  Musculoskeletal:  Negative for myalgias and neck pain.  Skin:  Negative for itching and rash.  Neurological:  Negative for seizures and loss of consciousness.  Psychiatric/Behavioral:  Negative for depression and suicidal ideas.        Physical Exam:  Vital signs in last 24 hours: Temp:  [98  F (36.7 C)] 98 F (36.7 C) (01/12 1201) Pulse Rate:  [94-114] 94 (01/12 1400) Resp:  [14-22] 14 (01/12 1400) BP: (111-125)/(60-73) 125/68 (01/12 1400) SpO2:  [97 %-100 %] 97 % (01/12 1400) Weight:  [58.6 kg] 58.6 kg (01/12 1210)   Last BM recorded by nurses in past 5 days No data recorded  Physical Exam Constitutional:      Appearance: She is ill-appearing.  HENT:     Head: Normocephalic and atraumatic.     Nose: Nose normal. No congestion.     Mouth/Throat:     Mouth: Mucous membranes are moist.     Pharynx: Oropharynx is clear.  Eyes:     General: No scleral icterus.    Extraocular Movements: Extraocular movements intact.  Cardiovascular:     Rate and Rhythm: Normal rate and regular rhythm.  Pulmonary:     Effort: Pulmonary effort is normal. No respiratory  distress.  Abdominal:     General: Bowel sounds are normal. There is no distension.     Palpations: Abdomen is soft. There is no mass.     Tenderness: There is abdominal tenderness. There is no guarding or rebound.     Hernia: No hernia is present.  Musculoskeletal:        General: No swelling. Normal range of motion.     Cervical back: Normal range of motion and neck supple.  Skin:    General: Skin is warm and dry.     Coloration: Skin is pale.  Neurological:     General: No focal deficit present.     Mental Status: She is oriented to person, place, and time.  Psychiatric:        Mood and Affect: Mood normal.        Behavior: Behavior normal.        Thought Content: Thought content normal.        Judgment: Judgment normal.      LAB RESULTS: Recent Labs    02/03/23 1552 02/06/23 1214  WBC 4.1 4.1  HGB 10.4* 9.2*  HCT 33.5* 29.5*  PLT 208 168   BMET Recent Labs    02/06/23 1214  NA 140  K 3.8  CL 111  CO2 21*  GLUCOSE 155*  BUN 24*  CREATININE 0.82  CALCIUM  9.0   LFT Recent Labs    02/06/23 1214  PROT 5.8*  ALBUMIN  3.0*  AST 34  ALT 40  ALKPHOS 110  BILITOT 0.4   PT/INR No  results for input(s): LABPROT, INR in the last 72 hours.  STUDIES: No results found.    Impression/Plan   Acute on chronic anemia secondary to recurrent GI bleeding in the setting of SMV/portal vein thrombus 3 EGDs within 45 days for same. Hgb 9.2 (10.4 3 days prior) BUN 24, CR 0.82, GFR greater than 60 Normal LFTs Lipase 22 Fecal occult positive Recent EGD 01/30/2023 showing gastritis with hemorrhage s/p APC, nonbleeding gastric ulcers, duodenitis. - EGD tomorrow - I thoroughly discussed the procedure with the patient (at bedside) to include nature of the procedure, alternatives, benefits, and risks (including but not limited to bleeding, infection, perforation, anesthesia/cardiac pulmonary complications).  Patient verbalized understanding and gave verbal consent to proceed with procedure. - PPI 40 Mg IV twice daily - Clear liquid diet today, n.p.o. midnight - Recommend consulting IR.  Recurrent GI bleed secondary to portal vein thrombosis and planning to get TIPS with IR 1/21. They may want to evaluate and do while she is inpatient versus outpatient if continuing to have frequent recurrent bleeds. -- Continue daily CBC and transfuse as needed to maintain HGB > 7   History of pancreatic cancer s/p chemoradiation Dx 2023. Follows with [facility]. EGD/EUS 06/2021 with Dr. Wilhelmenia showing chronic pancreatitis changes versus possible malignancy.  FNA showed malignant cells consistent with adenocarcinoma Last dose chemoradiation May 2024  History of PE on Eliquis  Last dose Eliquis  1/4  Thank you for your kind consultation, we will continue to follow.   Bayley CHRISTELLA Blower  02/06/2023, 2:30 PM    Attending physician's note  I have taken a history, reviewed the chart and examined the patient. I performed a substantive portion of this encounter, including complete performance of at least one of the key components, in conjunction with the APP. I agree with the APP's note, impression  and recommendations.   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 The patient agreed with the plan and demonstrated an understanding of the instructions.  Sheryl Bender , MD [phone]

## 2023-02-06 NOTE — Assessment & Plan Note (Addendum)
-   Continue bupropion, Buspar, Lexapro and mirtazapine

## 2023-02-06 NOTE — ED Notes (Signed)
 Pt's pain remains well controlled. Instructed to reach out if she gets in pain again.

## 2023-02-06 NOTE — Assessment & Plan Note (Addendum)
 Last Eliquis  was over a week before admission (1/4).  Hemoglobin down to 9.2 from last 10.4  Taken to EGD 1/13 by Dr. Federico, found bleeding GAVE in the antrum treated with APC, also several Forrest class III ulcers in gastric antrum.   -Continue PPI twice daily - Continue sucralfate  - Clear liquid diet - GI recommend to hold anticoagulation for now

## 2023-02-06 NOTE — ED Provider Notes (Signed)
 [place] EMERGENCY DEPARTMENT AT [facility] Provider Note   CSN: [id number] Arrival date & time: 02/06/23  1135     History  Chief Complaint  Patient presents with   Abdominal Pain   Weakness    Sheryl Bender is a 78 y.o. female.  Patient is a 78 year old female with a past medical history of pancreatic cancer status post chemo and radiation, prior GI bleeds, known hepatic vein thrombosis presenting to the emergency department with abdominal pain and weakness.  Patient reports over the last 2 weeks she has had progressive fatigue especially on exertion.  She states that she feels wobbly and weak like she might fall but denies feeling lightheaded like she might pass out.  She states that last night she started to develop epigastric pain with nausea, has not had any vomiting.  She states that it feels similar to when she has had pancreatitis in the past.  She states that she also started to have black stool last night.  She states that she has had recurrent GI bleeds.  She is scheduled to have a thrombectomy and TIPS procedure for her hepatic vein thrombosis on 1/21 currently.  The history is provided by the patient and a relative.  Abdominal Pain Weakness Associated symptoms: abdominal pain        Home Medications Prior to Admission medications   Medication Sig Start Date End Date Taking? Authorizing Provider  acetaminophen  (TYLENOL ) 500 MG tablet Take 2 tablets (1,000 mg total) by mouth every 8 (eight) hours as needed for moderate pain, headache or fever. Patient taking differently: Take 750-1,000 mg by mouth every 8 (eight) hours as needed for moderate pain (pain score 4-6), headache or fever. 07/21/21  Yes Ricky Fines, MD  buPROPion  (WELLBUTRIN  XL) 300 MG 24 hr tablet Take 300 mg by mouth daily after breakfast. Takes differently from prescribed: 20mg  BID 06/15/21  Yes [provider]  busPIRone  (BUSPAR ) 10 MG tablet Take 20 mg by mouth 2 (two) times  daily. 01/27/21  Yes [provider]  Cholecalciferol  (VITAMIN D3) 125 MCG (5000 UT) CAPS Take 5,000 Units by mouth every morning.   Yes [provider]  escitalopram  (LEXAPRO ) 20 MG tablet Take 20 mg by mouth at bedtime.   Yes [provider]  lactose free nutrition (BOOST) LIQD Take 237 mLs by mouth 2 (two) times daily between meals.   Yes [provider]  LINZESS  72 MCG capsule TAKE 1 CAPSULE BY MOUTH DAILY BEFORE BREAKFAST Patient taking differently: Take 72 mcg by mouth daily before breakfast. 01/20/23  Yes Mansouraty, Aloha Raddle., MD  mirtazapine  (REMERON ) 30 MG tablet Take 15 mg by mouth at bedtime.   Yes [provider]  pantoprazole  (PROTONIX ) 40 MG tablet Take 1 tablet (40 mg total) by mouth 2 (two) times daily. 02/02/23 05/03/23 Yes Laurence Locus, DO  QUEtiapine  (SEROQUEL ) 25 MG tablet Take 25 mg by mouth 2 (two) times daily. 12/14/22  Yes [provider]  lidocaine -prilocaine  (EMLA ) cream Apply a small amount to port a cath site (do not rub in) and cover with plastic wrap 1 hour prior to infusion appointments 09/15/21   Rogers Hai, MD  ondansetron  (ZOFRAN ) 4 MG tablet Take 1 tablet (4 mg total) by mouth every 6 (six) hours as needed for nausea or vomiting. 02/02/23 03/04/23  Laurence Locus, DO      Allergies    Other, Cherry, and Wound dressing adhesive    Review of Systems   Review of Systems  Gastrointestinal:  Positive for abdominal pain.  Neurological:  Positive for weakness.    Physical Exam Updated Vital Signs BP (!) 131/59   Pulse 91   Temp 98 F (36.7 C)   Resp 18   Ht 5' 2 (1.575 m)   Wt 58.6 kg   SpO2 99%   BMI 23.63 kg/m  Physical Exam Vitals and nursing note reviewed.  Constitutional:      General: She is not in acute distress.    Appearance: She is well-developed. She is ill-appearing.  HENT:     Head: Normocephalic and atraumatic.     Mouth/Throat:     Mouth: Mucous membranes are moist.  Eyes:      Extraocular Movements: Extraocular movements intact.  Cardiovascular:     Rate and Rhythm: Normal rate and regular rhythm.     Heart sounds: Normal heart sounds.  Pulmonary:     Effort: Pulmonary effort is normal.     Breath sounds: Normal breath sounds.  Abdominal:     General: Abdomen is flat.     Palpations: Abdomen is soft.     Tenderness: There is abdominal tenderness in the epigastric area.  Skin:    General: Skin is warm and dry.  Neurological:     General: No focal deficit present.     Mental Status: She is alert and oriented to person, place, and time.  Psychiatric:        Mood and Affect: Mood normal.        Behavior: Behavior normal.     ED Results / Procedures / Treatments   Labs (all labs ordered are listed, but only abnormal results are displayed) Labs Reviewed  COMPREHENSIVE METABOLIC PANEL - Abnormal; Notable for the following components:      Result Value   CO2 21 (*)    Glucose, Bld 155 (*)    BUN 24 (*)    Total Protein 5.8 (*)    Albumin  3.0 (*)    All other components within normal limits  CBC - Abnormal; Notable for the following components:   RBC 3.09 (*)    Hemoglobin 9.2 (*)    HCT 29.5 (*)    RDW 16.5 (*)    All other components within normal limits  POC OCCULT BLOOD, ED - Abnormal; Notable for the following components:   Fecal Occult Bld POSITIVE (*)    All other components within normal limits  LIPASE, BLOOD  TYPE AND SCREEN    EKG EKG Interpretation Date/Time:  Sunday February 06 2023 13:59:01 EST Ventricular Rate:  88 PR Interval:  160 QRS Duration:  81 QT Interval:  394 QTC Calculation: 477 R Axis:   12  Text Interpretation: Sinus rhythm Low voltage, extremity and precordial leads No significant change since last tracing Confirmed by Ellouise Fine ([id number]) on 02/06/2023 2:03:05 PM  Radiology No results found.  Procedures Procedures    Medications Ordered in ED Medications  pantoprazole  (PROTONIX ) injection 40 mg (  Intravenous Canceled Entry 02/06/23 1425)    Followed by  pantoprazole  (PROTONIX ) injection 40 mg (has no administration in time range)  sodium chloride  0.9 % bolus 500 mL (500 mLs Intravenous New Bag/Given 02/06/23 1405)  pantoprazole  (PROTONIX ) injection 40 mg (40 mg Intravenous Given 02/06/23 1433)    ED Course/ Medical Decision Making/ A&P Clinical Course as of 02/06/23 1509  Sun Feb 06, 2023  1402 Hemoccult positive, will be started on PPI. Will consult GI. Likely will need admission.  [VK]  1430  I spoke with Bayley PA with [facility] who will come see the patient, recommended IR consult as well.  [VK]  1509 Patient signed out to Dr. Pamella pending CTAP with plan for admission. [VK]    Clinical Course User Index [VK] Kingsley, Yasin Ducat K, DO                                 Medical Decision Making This patient presents to the ED with chief complaint(s) of weakness, dark stool, epigastric pain with pertinent past medical history of pancreatic cancer, recurrently GI bleeds, hepatic thrombosis which further complicates the presenting complaint. The complaint involves an extensive differential diagnosis and also carries with it a high risk of complications and morbidity.    The differential diagnosis includes pancreatitis, hepatitis, gastritis, GERD, GI bleed, anemia, coagulopathy, dehydration, electrolyte abnormality, arrhythmia, no focal neurologic deficits making CVA unlikely, deconditioning  Additional history obtained: Additional history obtained from family Records reviewed outpatient GI records  ED Course and Reassessment: On patient's arrival she was initially tachycardic otherwise hemodynamically stable, ill-appearing but in no acute distress.  Patient had labs initiated in triage that showed mildly worsening anemia from her baseline, 9.2 from around 10.63 days ago.  LFTs and creatinine at baseline.  Will add on lipase as well as perform Hemoccult.  Will have CT abdomen pelvis in the  setting of her history and abdominal pain.  Declined any pain control at this time and will be closely reassessed.  Independent labs interpretation:  The following labs were independently interpreted: mildly worsening anemia from baseline with hemoccult positive stools  Independent visualization of imaging: - Pending  Consultation: - Consulted or discussed management/test interpretation w/ external professional: GI    Amount and/or Complexity of Data Reviewed Labs: ordered. Radiology: ordered.  Risk Prescription drug management.          Final Clinical Impression(s) / ED Diagnoses Final diagnoses:  Gastrointestinal hemorrhage, unspecified gastrointestinal hemorrhage type    Rx / DC Orders ED Discharge Orders     None         Kingsley, Tonnette Zwiebel K, DO 02/06/23 1509

## 2023-02-06 NOTE — ED Notes (Signed)
Pt ambulated to the restroom with stand by assistance.

## 2023-02-06 NOTE — H&P (Signed)
 History and Physical    [name] FMW:[id number] DOB: 05/25/1945 DOA: 02/06/2023 DOS: the patient was seen and examined on 02/06/2023 PCP: Lonna Millman, DO  Patient coming from: Home  Chief Complaint:  Chief Complaint  Patient presents with   Abdominal Pain   Weakness   HPI: Sheryl Bender is a 78 y.o. female with medical history significant of Stage 1 pancreatic cancer s/p chemoradiation, pulmonary embolism on Eliquis , iron-deficiency anemia, interstitial pancreatitis, HTN, recurrent GI bleed who presents with melena and weakness.   Patient hospitalized from 12/2-12/5 with GI bleed requiring transfusion. 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  Colonoscopy with no active bleeding, minimal amount of old blood in the cecum, 6 mm polyp removed from mid sigmoid colon.    She then resumed Eliquis  on 12/11 and was re-admitted from 12/18-12/21 with recurrent GI bleed requiring transfusion. EGD found to have hemorrhagic gastritis, treated with APC, bipolar cautery.  GAVE is also possibility.  GI recommended PPI twice daily for 4 weeks then daily indefinitely. Asked to resume Eliquis  on 12/28.  Hospitalized again 1/4 -1/8 for GI bleed. EGD Gastritis with hemorrhage. Treated with argon plasma coagulation (APC). Non-bleeding gastric ulcers with no stigmata of bleeding. Duodenitis. CT abdomen and pelvis with contrast-question of the nonocclusive thrombus in the SMV but most significant thrombus not seen in the portal vein including the main portal vein. low-density masslike area along the uncinate process with associated severe ductal dilatation in caliber change. Plan was for IR thrombectomy on 1/21 and to hold any systemic anticoagulation.   Believes she had melena about 3 days ago and decided to wait but started to feel weak, had unsteady gait and pain across upper abdomen and decided to come in. Denies any  NSAIDS or alcohol  use.     On arrival to the ED, she is afebrile normotensive on room air. Hemoglobin is stable at 9.2 although has downward trended from 10.4 just a few days ago.  Positive FOBT.  CT of the abdomen and pelvis showed progressive thrombus within the superior mesenteric vein over the past 8 days which is now occlusive.  Progressive thrombus within the main and intrahepatic portal veins. Known hypodense pancreatic mass grossly unchanged.  ED PA discussed with [facility] and IR who will see in consultation.  Hospitalist consulted for admission.   Review of Systems: As mentioned in the history of present illness. All other systems reviewed and are negative. Past Medical History:  Diagnosis Date   Anxiety    Arthritis    Depression    Dysrhythmia    hx palpitations greater than 5 yrs -neg echo, stress per pt ? where or dr   GERD (gastroesophageal reflux disease)    occ   Nausea & vomiting 08/24/2021   Pancreatic adenocarcinoma (HCC)    Pancreatic pseudocyst    Port-A-Cath in place 09/15/2021   Pulmonary embolism [facility])    September 2022   Past Surgical History:  Procedure Laterality Date   ANTERIOR LAT LUMBAR FUSION Right 12/16/2015   Procedure: RIGHT LUMBAR TWO-THREE, LUMBAR THREE-FOUR, LUMBAR FOUR-FIVE ANTEROLATERAL LUMBAR INTERBODY FUSION;  Surgeon: Fairy Levels, MD;  Location: [facility] OR;  Service: Neurosurgery;  Laterality: Right;   BALLOON DILATION N/A 08/27/2021   Procedure: BALLOON DILATION;  Surgeon: Wilhelmenia Aloha Raddle., MD;  Location: [facility];  Service: Gastroenterology;  Laterality: N/A;   BIOPSY  07/09/2021   Procedure: BIOPSY;  Surgeon: Wilhelmenia Aloha Raddle., MD;  Location: [facility];  Service: Gastroenterology;;   COLONOSCOPY WITH PROPOFOL  N/A 12/29/2022   Procedure: COLONOSCOPY WITH PROPOFOL ;  Surgeon: Shaaron Lamar HERO, MD;  [facility];  Service: Endoscopy;  Laterality: N/A;   CYST GASTROSTOMY  08/27/2021   Procedure: CYST GASTROSTOMY;   Surgeon: Wilhelmenia Aloha Raddle., MD;  Location: [facility];  Service: Gastroenterology;;   ESOPHAGOGASTRODUODENOSCOPY N/A 07/09/2021   Procedure: ESOPHAGOGASTRODUODENOSCOPY (EGD);  Surgeon: Wilhelmenia Aloha Raddle., MD;  [facility];  Service: Gastroenterology;  Laterality: N/A;   ESOPHAGOGASTRODUODENOSCOPY (EGD) WITH PROPOFOL  N/A 08/27/2021   Procedure: ESOPHAGOGASTRODUODENOSCOPY (EGD) WITH PROPOFOL ;  Surgeon: Wilhelmenia Aloha Raddle., MD;  Location: [facility];  Service: Gastroenterology;  Laterality: N/A;   ESOPHAGOGASTRODUODENOSCOPY (EGD) WITH PROPOFOL  N/A 10/08/2021   Procedure: ESOPHAGOGASTRODUODENOSCOPY (EGD) WITH PROPOFOL ;  Surgeon: Wilhelmenia Aloha Raddle., MD;  Location: [facility];  Service: Gastroenterology;  Laterality: N/A;   ESOPHAGOGASTRODUODENOSCOPY (EGD) WITH PROPOFOL  N/A 12/29/2022   Procedure: ESOPHAGOGASTRODUODENOSCOPY (EGD) WITH PROPOFOL ;  Surgeon: Shaaron Lamar HERO, MD;  [facility];  Service: Endoscopy;  Laterality: N/A;   ESOPHAGOGASTRODUODENOSCOPY (EGD) WITH PROPOFOL  N/A 01/13/2023   Procedure: ESOPHAGOGASTRODUODENOSCOPY (EGD) WITH PROPOFOL ;  Surgeon: Cinderella Deatrice FALCON, MD;  [facility];  Service: Endoscopy;  Laterality: N/A;   ESOPHAGOGASTRODUODENOSCOPY (EGD) WITH PROPOFOL  N/A 01/30/2023   Procedure: ESOPHAGOGASTRODUODENOSCOPY (EGD) WITH PROPOFOL ;  Surgeon: Cindie Carlin POUR, DO;  [facility];  Service: Endoscopy;  Laterality: N/A;   EUS N/A 07/09/2021   Procedure: UPPER ENDOSCOPIC ULTRASOUND (EUS) RADIAL;  Surgeon: Wilhelmenia Aloha Raddle., MD;  Location: [facility] ENDOSCOPY;  Service: Gastroenterology;  Laterality: N/A;   EUS N/A 08/27/2021   Procedure: UPPER ENDOSCOPIC ULTRASOUND (EUS) LINEAR;  Surgeon: Wilhelmenia Aloha Raddle., MD;  Location: [facility];  Service: Gastroenterology;  Laterality: N/A;   FINE NEEDLE ASPIRATION  07/09/2021   Procedure: FINE NEEDLE ASPIRATION (FNA) LINEAR;  Surgeon: Wilhelmenia Aloha Raddle., MD;  Location: [facility]  ENDOSCOPY;  Service: Gastroenterology;;   HEMOSTASIS CLIP PLACEMENT  12/29/2022   Procedure: HEMOSTASIS CLIP PLACEMENT;  Surgeon: Shaaron Lamar HERO, MD;  [facility];  Service: Endoscopy;;   HOT HEMOSTASIS  12/29/2022   Procedure: HOT HEMOSTASIS (ARGON PLASMA COAGULATION/BICAP);  Surgeon: Shaaron Lamar HERO, MD;  [facility];  Service: Endoscopy;;   HOT HEMOSTASIS  01/13/2023   Procedure: HOT HEMOSTASIS (ARGON PLASMA COAGULATION/BICAP);  Surgeon: Cinderella Deatrice FALCON, MD;  [facility];  Service: Endoscopy;;  used apc and goldprobe   HOT HEMOSTASIS  01/30/2023   Procedure: HOT HEMOSTASIS (ARGON PLASMA COAGULATION/BICAP);  Surgeon: Cindie Carlin POUR, DO;  [facility];  Service: Endoscopy;;   IR IMAGING GUIDED PORT INSERTION  09/14/2021   LAPAROSCOPY N/A 02/15/2022   Procedure: STAGING DIAGNOSTIC;  Surgeon: Dasie Leonor CROME, MD;  Location: [facility] OR;  Service: General;  Laterality: N/A;   LUMBAR PERCUTANEOUS PEDICLE SCREW 3 LEVEL Bilateral 12/16/2015   Procedure: PERCUTANEOUS PEDICLE SCREWS BILATERALLY AT LUMBAR TWO-FIVE;  Surgeon: Fairy Levels, MD;  Location[facility] OR;  Service: Neurosurgery;  Laterality: Bilateral;   PANCREATIC STENT PLACEMENT  08/27/2021   Procedure: PANCREATIC STENT PLACEMENT;  Surgeon: Wilhelmenia Aloha Raddle., MD;  Location: [facility];  Service: Gastroenterology;;   POLYPECTOMY  12/29/2022   Procedure: POLYPECTOMY INTESTINAL;  Surgeon: Shaaron Lamar HERO, MD;  [facility];  Service: Endoscopy;;   STENT REMOVAL  10/08/2021   Procedure: CLEDA REMOVAL;  Surgeon: Wilhelmenia Aloha Raddle., MD;  Location: [facility];  Service: Gastroenterology;;   TUBAL LIGATION  [phone]  WHIPPLE PROCEDURE N/A 02/15/2022   Procedure: ATTEMPTED WHIPPLE PROCEDURE;  Surgeon: Dasie Leonor CROME, MD;  Location: [facility] OR;  Service: General;  Laterality: N/A;   Social History:  reports that she has never smoked. She has never used smokeless tobacco. She reports that she does  not drink alcohol  and does not use drugs.  Allergies  Allergen Reactions   Other Hives and Other (See Comments)    Cherry wood just cut- smelled it and broke out; cannot tolerate ANY cherry fragrances, either      Cherry Hives   Wound Dressing Adhesive Rash and Other (See Comments)    Band-Aids = local reaction    Family History  Problem Relation Age of Onset   Pancreatitis Brother        alcoholic pancreatitis   Pancreatic cancer Neg Hx    Colon cancer Neg Hx     Prior to Admission medications   Medication Sig Start Date End Date Taking? Authorizing Provider  acetaminophen  (TYLENOL ) 500 MG tablet Take 2 tablets (1,000 mg total) by mouth every 8 (eight) hours as needed for moderate pain, headache or fever. Patient taking differently: Take 750-1,000 mg by mouth every 8 (eight) hours as needed for moderate pain (pain score 4-6), headache or fever. 07/21/21  Yes Ricky Fines, MD  buPROPion  (WELLBUTRIN  XL) 300 MG 24 hr tablet Take 300 mg by mouth daily after breakfast. Takes differently from prescribed: 20mg  BID 06/15/21  Yes [provider]  busPIRone  (BUSPAR ) 10 MG tablet Take 20 mg by mouth 2 (two) times daily. 01/27/21  Yes [provider]  Cholecalciferol  (VITAMIN D3) 125 MCG (5000 UT) CAPS Take 5,000 Units by mouth every morning.   Yes [provider]  escitalopram  (LEXAPRO ) 20 MG tablet Take 20 mg by mouth at bedtime.   Yes [provider]  lactose free nutrition (BOOST) LIQD Take 237 mLs by mouth 2 (two) times daily between meals.   Yes [provider]  LINZESS  72 MCG capsule TAKE 1 CAPSULE BY MOUTH DAILY BEFORE BREAKFAST Patient taking differently: Take 72 mcg by mouth daily before breakfast. 01/20/23  Yes Mansouraty, Aloha Raddle., MD  mirtazapine  (REMERON ) 30 MG tablet Take 15 mg by mouth at bedtime.   Yes [provider]  pantoprazole  (PROTONIX ) 40 MG tablet Take 1 tablet (40 mg total) by mouth 2 (two) times daily. 02/02/23  05/03/23 Yes Laurence Locus, DO  QUEtiapine  (SEROQUEL ) 25 MG tablet Take 25 mg by mouth 2 (two) times daily. 12/14/22  Yes [provider]  lidocaine -prilocaine  (EMLA ) cream Apply a small amount to port a cath site (do not rub in) and cover with plastic wrap 1 hour prior to infusion appointments 09/15/21   Rogers Hai, MD  ondansetron  (ZOFRAN ) 4 MG tablet Take 1 tablet (4 mg total) by mouth every 6 (six) hours as needed for nausea or vomiting. 02/02/23 03/04/23  Laurence Locus, DO    Physical Exam: Vitals:   02/06/23 1530 02/06/23 1659 02/06/23 1835 02/06/23 1900  BP: (!) 132/59  134/67 (!) 122/103  Pulse: 90  90 90  Resp: 17  17 13   Temp:  98.1 F (36.7 C)    TempSrc:  Oral    SpO2: 100%  97% 100%  Weight:      Height:       Constitutional: NAD, calm, comfortable, well appearing elderly female lying in bed Eyes:  lids and conjunctivae normal ENMT: Mucous membranes are moist.  Neck: normal, supple Respiratory: clear to auscultation bilaterally, no  wheezing, no crackles. Normal respiratory effort. No accessory muscle use.  Cardiovascular: Regular rate and rhythm, no murmurs / rubs / gallops. No extremity edema.   Abdomen: soft, diffusely tender,non-distended, no masses palpated. No rebound tenderness, guarding or rigidity.  Musculoskeletal: no clubbing / cyanosis. No joint deformity upper and lower extremities.  Normal muscle tone.  Skin: no rashes, lesions, ulcers. No induration Neurologic: CN 2-12 grossly intact.  Psychiatric: Normal judgment and insight. Alert and oriented x 3. Normal mood.   Data Reviewed:  See HPI  Assessment and Plan: * GI bleed -recurrent GI bleed with 2 hospitalization in December and recently in early January requiring transfusion and intervention on EGD. Last  EGD Gastritis with hemorrhage. Treated with argon plasma coagulation (APC). Non-bleeding gastric ulcers with no stigmata of bleeding. Duodenitis. -Last took Eliquis  1/4  -Hgb has downward  trended to 9.2 from 10.4 but stable.  -Transfusion threshold of Hgb <7 -[facility] has seen in consultation and will take for EGD in the morning. Clears now and NPO midnight -IV PPI BID -Trend CBC in the morning  Portal vein thrombosis -CT of the abdomen and pelvis showed progressive thrombus within the superior mesenteric vein over the past 8 days which is now occlusive.  Progressive thrombus within the main and intrahepatic portal veins. -Pt originally had planned TIPS and thrombectomy with IR on 02/14/22. IR consulted and will see in consultation tomorrow to decide to whether to proceed with procedure sooner -not able to be on systemic anticoagulation therapy due to recurrent and active GI bleed   Pancreatic adenocarcinoma [facility]) -last chemoradiation in May 2024. Followed with cancer center at [facility] but is getting second option from [place].  Depression Anxiety -continue bupropion , Buspirone , Lexapro  and mirtazapine       Advance Care Planning:   Code Status: Full Code   Consults: [facility] and IR   Family Communication: None at bedside  Severity of Illness: The appropriate patient status for this patient is INPATIENT. Inpatient status is judged to be reasonable and necessary in order to provide the required intensity of service to ensure the patient's safety. The patient's presenting symptoms, physical exam findings, and initial radiographic and laboratory data in the context of their chronic comorbidities is felt to place them at high risk for further clinical deterioration. Furthermore, it is not anticipated that the patient will be medically stable for discharge from the hospital within 2 midnights of admission.   * I certify that at the point of admission it is my clinical judgment that the patient will require inpatient hospital care spanning beyond 2 midnights from the point of admission due to high intensity of service, high risk for further deterioration and high frequency of  surveillance required.*  Author: Alfrieda ONEIDA Pillar, DO 02/06/2023 7:45 PM  For on call review [email or web address].

## 2023-02-06 NOTE — ED Provider Notes (Signed)
  Physical Exam  BP 134/67 (BP Location: Right Arm)   Pulse 90   Temp 98.1 F (36.7 C) (Oral)   Resp 17   Ht 5' 2 (1.575 m)   Wt 58.6 kg   SpO2 97%   BMI 23.63 kg/m   Physical Exam Vitals and nursing note reviewed.  HENT:     Head: Normocephalic and atraumatic.  Eyes:     Pupils: Pupils are equal, round, and reactive to light.  Cardiovascular:     Rate and Rhythm: Normal rate and regular rhythm.  Pulmonary:     Effort: Pulmonary effort is normal.     Breath sounds: Normal breath sounds.  Abdominal:     Palpations: Abdomen is soft.     Tenderness: There is no abdominal tenderness.  Skin:    General: Skin is warm and dry.  Neurological:     Mental Status: She is alert.  Psychiatric:        Mood and Affect: Mood normal.     Procedures  Procedures  ED Course / MDM   Clinical Course as of 02/06/23 1840  Sun Feb 06, 2023  1402 Hemoccult positive, will be started on PPI. Will consult GI. Likely will need admission.  [VK]  1430 I spoke with Bayley PA with [facility] who will come see the patient, recommended IR consult as well.  [VK]  1509 Patient signed out to Dr. Pamella pending CTAP with plan for admission. [VK]  1813 CT abdomen pelvis shows progression of mesenteric vein thrombus.  I discussed this with IR physician on-call.  IR team will evaluate patient tomorrow during hospitalization.  No need for intervention at this time.  Patient has remained hemodynamically stable at this time.  Will admit to medicine [MP]  1839 Discussed with admitting hospitalist who accept patient for admission [MP]    Clinical Course User Index [MP] Pamella Ozell LABOR, DO [VK] Kingsley, Victoria K, DO   Medical Decision Making I, Ozell Pamella DO, have assumed care of this patient from the previous provider pending CT abdomen pelvis and admission  Amount and/or Complexity of Data Reviewed Labs: ordered. Radiology: ordered.  Risk Prescription drug management.   Final diagnosis GI  bleeding Pancreatic cancer Superior mesenteric vein thrombosis       Pamella Ozell LABOR, DO 02/06/23 1841

## 2023-02-06 NOTE — ED Triage Notes (Signed)
 Pt with hx of pancreatic ca for dark stool and abdominal pain.  Her son also states she seems very weak.

## 2023-02-06 NOTE — Assessment & Plan Note (Signed)
-  last chemoradiation in May 2024. Followed with cancer center at [facility] but is getting second option from [place].

## 2023-02-06 NOTE — H&P (View-Only) (Signed)
 Consultation  Referring Provider:  [facility]  Primary Care Physician:  Lonna Millman, DO Primary Gastroenterologist:  [facility]    Reason for Consultation:     acute on chronic anemia  LOS: 0 days          HPI:   Sheryl Bender is a 78 y.o. 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  Patient has had recent multiple admissions for recurrent anemia and multiple procedures.  Admission 12/29/2022 for recurrent upper GI bleeding.  Underwent EGD/colonoscopy (Dr. Shaaron) which showed 2 antral Diealfoy's with active bleeding s/p gold probe and clipped.  2 other small areas of slight oozing in the prepyloric mucosa sealed with gold probe.  Colonoscopy showed small polyp in sigmoid and small amount of old blood in cecum with no active bleeding.  Had another EGD 01/13/2023 (Dr. Cinderella) with gastritis with active bleeding s/p APC and bipolar cautery.  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  She then underwent repeat EGD 01/30/2023 (Dr. Cindie) which showed gastritis with hemorrhage s/p APC, nonbleeding gastric ulcers, duodenitis.  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  Patient is scheduled to see IR 02/15/2023 for discussion of TIPS approach for portal thrombectomy.  Recently seen by [facility] PA [facility] 02/03/2023 who recommended if recurrent bleeding proceed to [facility] for further  evaluation.  Patient presents today with melena and drift in hemoglobin. Hgb 9.2 (10.4 3 days prior) BUN 24, CR 0.82, GFR greater than 60 Normal LFTs Lipase 22 Fecal occult positive  Patient states she was only home a couple days from the hospital before recurrent GI bleed came back.  She notes melena/reddish stools.  She also notes nausea, no vomiting.  Generalized abdominal pain as well that is at a 3 out of 10.   PREVIOUS GI WORKUP   Colonoscopy 12/29/22 with 6 mm polyp in the mid sigmoid colon removed, minimal amount of old blood in the cecum with no actively bleeding lesions anywhere in the lower GI tract.   EGD 12/29/22 showing 2 antral Dielafoy's with active bleeding status post gold probe and clipped. Two other small areas with slight oozing in immediate prepyloric mucosa sealed with gold probe.    EGD 01/13/23 showing gastritis with active hemorrhaging treated with APC therapy and bipolar cautery. 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    EGD 01/30/2023 showing gastritis with hemorrhage s/p APC, nonbleeding gastric ulcers, duodenitis.  Past Medical History:  Diagnosis Date  . Anxiety   . Arthritis   . Depression   . Dysrhythmia    hx palpitations greater than 5 yrs -neg echo, stress per pt ? where or dr  . GERD (gastroesophageal reflux disease)    occ  . Nausea & vomiting 08/24/2021  . Pancreatic adenocarcinoma (HCC)   . Pancreatic pseudocyst   . Port-A-Cath in place 09/15/2021  . Pulmonary embolism [facility])    September 2022    Surgical History:  She  has a past surgical  history that includes Tubal ligation (1974); Anterior lat lumbar fusion (Right, 12/16/2015); Lumbar percutaneous pedicle screw 3 level (Bilateral, 12/16/2015); EUS (N/A, 07/09/2021); Esophagogastroduodenoscopy (N/A, 07/09/2021); biopsy (07/09/2021); Fine needle aspiration (07/09/2021); EUS (N/A, 08/27/2021); Esophagogastroduodenoscopy (egd) with propofol  (N/A, 08/27/2021); Cyst gastrostomy  (08/27/2021); pancreatic stent placement (08/27/2021); Balloon dilation (N/A, 08/27/2021); IR IMAGING GUIDED PORT INSERTION (09/14/2021); Esophagogastroduodenoscopy (egd) with propofol  (N/A, 10/08/2021); Stent removal (10/08/2021); Whipple procedure (N/A, 02/15/2022); laparoscopy (N/A, 02/15/2022); Colonoscopy with propofol  (N/A, 12/29/2022); Esophagogastroduodenoscopy (egd) with propofol  (N/A, 12/29/2022); Hot hemostasis (12/29/2022); Hemostasis clip placement (12/29/2022); Polypectomy (12/29/2022); Esophagogastroduodenoscopy (egd) with propofol  (N/A, 01/13/2023); Hot hemostasis (01/13/2023); Esophagogastroduodenoscopy (egd) with propofol  (N/A, 01/30/2023); and Hot hemostasis (01/30/2023). Family History:  Her family history includes Pancreatitis in her brother. Social History:   reports that she has never smoked. She has never used smokeless tobacco. She reports that she does not drink alcohol  and does not use drugs.  Prior to Admission medications   Medication Sig Start Date End Date Taking? Authorizing Provider  acetaminophen  (TYLENOL ) 500 MG tablet Take 2 tablets (1,000 mg total) by mouth every 8 (eight) hours as needed for moderate pain, headache or fever. Patient taking differently: Take 750-1,000 mg by mouth every 8 (eight) hours as needed for moderate pain (pain score 4-6), headache or fever. 07/21/21  Yes Ricky Fines, MD  buPROPion  (WELLBUTRIN  XL) 300 MG 24 hr tablet Take 300 mg by mouth daily after breakfast. Takes differently from prescribed: 20mg  BID 06/15/21  Yes [provider]  busPIRone  (BUSPAR ) 10 MG tablet Take 20 mg by mouth 2 (two) times daily. 01/27/21  Yes [provider]  Cholecalciferol  (VITAMIN D3) 125 MCG (5000 UT) CAPS Take 5,000 Units by mouth every morning.   Yes [provider]  escitalopram  (LEXAPRO ) 20 MG tablet Take 20 mg by mouth at bedtime.   Yes [provider]  lactose free nutrition (BOOST) LIQD Take 237 mLs by mouth 2 (two) times daily between  meals.   Yes [provider]  LINZESS  72 MCG capsule TAKE 1 CAPSULE BY MOUTH DAILY BEFORE BREAKFAST Patient taking differently: Take 72 mcg by mouth daily before breakfast. 01/20/23  Yes Mansouraty, Aloha Raddle., MD  mirtazapine  (REMERON ) 30 MG tablet Take 15 mg by mouth at bedtime.   Yes [provider]  pantoprazole  (PROTONIX ) 40 MG tablet Take 1 tablet (40 mg total) by mouth 2 (two) times daily. 02/02/23 05/03/23 Yes Laurence Locus, DO  QUEtiapine  (SEROQUEL ) 25 MG tablet Take 25 mg by mouth 2 (two) times daily. 12/14/22  Yes [provider]  lidocaine -prilocaine  (EMLA ) cream Apply a small amount to port a cath site (do not rub in) and cover with plastic wrap 1 hour prior to infusion appointments 09/15/21   Rogers Hai, MD  ondansetron  (ZOFRAN ) 4 MG tablet Take 1 tablet (4 mg total) by mouth every 6 (six) hours as needed for nausea or vomiting. 02/02/23 03/04/23  Laurence Locus, DO    Current Facility-Administered Medications  Medication Dose Route Frequency Provider Last Rate Last Admin  . [START ON 02/07/2023] pantoprazole  (PROTONIX ) injection 40 mg  40 mg Intravenous Q12H Kingsley, Victoria K, DO      . pantoprazole  (PROTONIX ) injection 40 mg  40 mg Intravenous Q5 min Kingsley, Victoria K, DO       Current Outpatient Medications  Medication Sig Dispense Refill  . acetaminophen  (TYLENOL ) 500 MG tablet Take 2 tablets (1,000 mg total) by mouth every 8 (eight) hours as needed for moderate pain, headache or fever. (Patient taking differently: Take  750-1,000 mg by mouth every 8 (eight) hours as needed for moderate pain (pain score 4-6), headache or fever.)    . buPROPion  (WELLBUTRIN  XL) 300 MG 24 hr tablet Take 300 mg by mouth daily after breakfast. Takes differently from prescribed: 20mg  BID    . busPIRone  (BUSPAR ) 10 MG tablet Take 20 mg by mouth 2 (two) times daily.    . Cholecalciferol  (VITAMIN D3) 125 MCG (5000 UT) CAPS Take 5,000 Units by mouth every morning.    .  escitalopram  (LEXAPRO ) 20 MG tablet Take 20 mg by mouth at bedtime.    . lactose free nutrition (BOOST) LIQD Take 237 mLs by mouth 2 (two) times daily between meals.    . LINZESS  72 MCG capsule TAKE 1 CAPSULE BY MOUTH DAILY BEFORE BREAKFAST (Patient taking differently: Take 72 mcg by mouth daily before breakfast.) 30 capsule 0  . mirtazapine  (REMERON ) 30 MG tablet Take 15 mg by mouth at bedtime.    . pantoprazole  (PROTONIX ) 40 MG tablet Take 1 tablet (40 mg total) by mouth 2 (two) times daily. 180 tablet 0  . QUEtiapine  (SEROQUEL ) 25 MG tablet Take 25 mg by mouth 2 (two) times daily.    . lidocaine -prilocaine  (EMLA ) cream Apply a small amount to port a cath site (do not rub in) and cover with plastic wrap 1 hour prior to infusion appointments 30 g 3  . ondansetron  (ZOFRAN ) 4 MG tablet Take 1 tablet (4 mg total) by mouth every 6 (six) hours as needed for nausea or vomiting. 30 tablet 0    Allergies as of 02/06/2023 - Review Complete 02/06/2023  Allergen Reaction Noted  . Other Hives and Other (See Comments) 12/08/2015  . Cherry Hives 03/30/2021  . Wound dressing adhesive Rash and Other (See Comments) 12/08/2015    Review of Systems  Constitutional:  Negative for chills, fever and weight loss.  HENT:  Negative for hearing loss and tinnitus.   Eyes:  Negative for blurred vision and double vision.  Respiratory:  Negative for cough and hemoptysis.   Cardiovascular:  Negative for chest pain and palpitations.  Gastrointestinal:  Positive for abdominal pain, blood in stool, melena and nausea. Negative for constipation, diarrhea, heartburn and vomiting.  Genitourinary:  Negative for dysuria and urgency.  Musculoskeletal:  Negative for myalgias and neck pain.  Skin:  Negative for itching and rash.  Neurological:  Negative for seizures and loss of consciousness.  Psychiatric/Behavioral:  Negative for depression and suicidal ideas.        Physical Exam:  Vital signs in last 24 hours: Temp:  [98  F (36.7 C)] 98 F (36.7 C) (01/12 1201) Pulse Rate:  [94-114] 94 (01/12 1400) Resp:  [14-22] 14 (01/12 1400) BP: (111-125)/(60-73) 125/68 (01/12 1400) SpO2:  [97 %-100 %] 97 % (01/12 1400) Weight:  [58.6 kg] 58.6 kg (01/12 1210)   Last BM recorded by nurses in past 5 days No data recorded  Physical Exam Constitutional:      Appearance: She is ill-appearing.  HENT:     Head: Normocephalic and atraumatic.     Nose: Nose normal. No congestion.     Mouth/Throat:     Mouth: Mucous membranes are moist.     Pharynx: Oropharynx is clear.  Eyes:     General: No scleral icterus.    Extraocular Movements: Extraocular movements intact.  Cardiovascular:     Rate and Rhythm: Normal rate and regular rhythm.  Pulmonary:     Effort: Pulmonary effort is normal. No respiratory  distress.  Abdominal:     General: Bowel sounds are normal. There is no distension.     Palpations: Abdomen is soft. There is no mass.     Tenderness: There is abdominal tenderness. There is no guarding or rebound.     Hernia: No hernia is present.  Musculoskeletal:        General: No swelling. Normal range of motion.     Cervical back: Normal range of motion and neck supple.  Skin:    General: Skin is warm and dry.     Coloration: Skin is pale.  Neurological:     General: No focal deficit present.     Mental Status: She is oriented to person, place, and time.  Psychiatric:        Mood and Affect: Mood normal.        Behavior: Behavior normal.        Thought Content: Thought content normal.        Judgment: Judgment normal.      LAB RESULTS: Recent Labs    02/03/23 1552 02/06/23 1214  WBC 4.1 4.1  HGB 10.4* 9.2*  HCT 33.5* 29.5*  PLT 208 168   BMET Recent Labs    02/06/23 1214  NA 140  K 3.8  CL 111  CO2 21*  GLUCOSE 155*  BUN 24*  CREATININE 0.82  CALCIUM  9.0   LFT Recent Labs    02/06/23 1214  PROT 5.8*  ALBUMIN  3.0*  AST 34  ALT 40  ALKPHOS 110  BILITOT 0.4   PT/INR No  results for input(s): LABPROT, INR in the last 72 hours.  STUDIES: No results found.    Impression/Plan   Acute on chronic anemia secondary to recurrent GI bleeding in the setting of SMV/portal vein thrombus 3 EGDs within 45 days for same. Hgb 9.2 (10.4 3 days prior) BUN 24, CR 0.82, GFR greater than 60 Normal LFTs Lipase 22 Fecal occult positive Recent EGD 01/30/2023 showing gastritis with hemorrhage s/p APC, nonbleeding gastric ulcers, duodenitis. - EGD tomorrow - I thoroughly discussed the procedure with the patient (at bedside) to include nature of the procedure, alternatives, benefits, and risks (including but not limited to bleeding, infection, perforation, anesthesia/cardiac pulmonary complications).  Patient verbalized understanding and gave verbal consent to proceed with procedure. - PPI 40 Mg IV twice daily - Clear liquid diet today, n.p.o. midnight - Recommend consulting IR.  Recurrent GI bleed secondary to portal vein thrombosis and planning to get TIPS with IR 1/21. They may want to evaluate and do while she is inpatient versus outpatient if continuing to have frequent recurrent bleeds. -- Continue daily CBC and transfuse as needed to maintain HGB > 7   History of pancreatic cancer s/p chemoradiation Dx 2023. Follows with [facility]ology. EGD/EUS 06/2021 with Dr. Wilhelmenia showing chronic pancreatitis changes versus possible malignancy.  FNA showed malignant cells consistent with adenocarcinoma Last dose chemoradiation May 2024  History of PE on Eliquis  Last dose Eliquis  1/4  Thank you for your kind consultation, we will continue to follow.   Bayley CHRISTELLA Blower  02/06/2023, 2:30 PM    Attending physician's note  I have taken a history, reviewed the chart and examined the patient. I performed a substantive portion of this encounter, including complete performance of at least one of the key components, in conjunction with the APP. I agree with the APP's note, impression  and recommendations.   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 The patient agreed with the plan and demonstrated an understanding of the instructions.  LOIS Wilkie Mcgee , MD [phone]

## 2023-02-07 ENCOUNTER — Encounter (HOSPITAL_COMMUNITY)
Admission: EM | Disposition: A | Discharge status: Home / Self Care | Payer: Self-pay | Source: Home / Self Care | Attending: Internal Medicine

## 2023-02-07 ENCOUNTER — Encounter (HOSPITAL_COMMUNITY): Payer: Self-pay | Admitting: Family Medicine

## 2023-02-07 ENCOUNTER — Inpatient Hospital Stay (HOSPITAL_COMMUNITY): Payer: Medicare Other | Admitting: Anesthesiology

## 2023-02-07 DIAGNOSIS — D509 Iron deficiency anemia, unspecified: Secondary | ICD-10-CM

## 2023-02-07 DIAGNOSIS — K921 Melena: Secondary | ICD-10-CM

## 2023-02-07 DIAGNOSIS — K31811 Angiodysplasia of stomach and duodenum with bleeding: Secondary | ICD-10-CM

## 2023-02-07 DIAGNOSIS — F418 Other specified anxiety disorders: Secondary | ICD-10-CM

## 2023-02-07 DIAGNOSIS — K449 Diaphragmatic hernia without obstruction or gangrene: Secondary | ICD-10-CM

## 2023-02-07 DIAGNOSIS — I10 Essential (primary) hypertension: Secondary | ICD-10-CM | POA: Diagnosis not present

## 2023-02-07 DIAGNOSIS — K254 Chronic or unspecified gastric ulcer with hemorrhage: Secondary | ICD-10-CM | POA: Diagnosis not present

## 2023-02-07 DIAGNOSIS — K2901 Acute gastritis with bleeding: Secondary | ICD-10-CM | POA: Diagnosis not present

## 2023-02-07 DIAGNOSIS — K259 Gastric ulcer, unspecified as acute or chronic, without hemorrhage or perforation: Secondary | ICD-10-CM

## 2023-02-07 DIAGNOSIS — K3189 Other diseases of stomach and duodenum: Secondary | ICD-10-CM

## 2023-02-07 HISTORY — PX: ESOPHAGOGASTRODUODENOSCOPY: SHX5428

## 2023-02-07 HISTORY — PX: HOT HEMOSTASIS: SHX5433

## 2023-02-07 LAB — CBC
HCT: 26.7 % — ABNORMAL LOW (ref 36.0–46.0)
Hemoglobin: 8.4 g/dL — ABNORMAL LOW (ref 12.0–15.0)
MCH: 30.1 pg (ref 26.0–34.0)
MCHC: 31.5 g/dL (ref 30.0–36.0)
MCV: 95.7 fL (ref 80.0–100.0)
Platelets: 147 10*3/uL — ABNORMAL LOW (ref 150–400)
RBC: 2.79 MIL/uL — ABNORMAL LOW (ref 3.87–5.11)
RDW: 16.4 % — ABNORMAL HIGH (ref 11.5–15.5)
WBC: 3.7 10*3/uL — ABNORMAL LOW (ref 4.0–10.5)
nRBC: 0 % (ref 0.0–0.2)

## 2023-02-07 LAB — BASIC METABOLIC PANEL
Anion gap: 8 (ref 5–15)
BUN: 17 mg/dL (ref 8–23)
CO2: 20 mmol/L — ABNORMAL LOW (ref 22–32)
Calcium: 8.5 mg/dL — ABNORMAL LOW (ref 8.9–10.3)
Chloride: 111 mmol/L (ref 98–111)
Creatinine, Ser: 0.74 mg/dL (ref 0.44–1.00)
GFR, Estimated: >60 mL/min (ref >60)
Glucose, Bld: 114 mg/dL — ABNORMAL HIGH (ref 70–99)
Potassium: 3.6 mmol/L (ref 3.5–5.1)
Sodium: 139 mmol/L (ref 135–145)

## 2023-02-07 SURGERY — EGD (ESOPHAGOGASTRODUODENOSCOPY)
Anesthesia: Monitor Anesthesia Care

## 2023-02-07 MED ORDER — QUETIAPINE FUMARATE 25 MG PO TABS
25.0000 mg | ORAL_TABLET | Freq: Two times a day (BID) | ORAL | Status: DC
  Administered 2023-02-07 – 2023-02-13 (×11): 25 mg via ORAL
  Filled 2023-02-07 (×13): qty 1

## 2023-02-07 MED ORDER — SODIUM CHLORIDE 0.9% FLUSH
10.0000 mL | Freq: Two times a day (BID) | INTRAVENOUS | Status: DC
  Administered 2023-02-08 – 2023-02-11 (×8): 10 mL

## 2023-02-07 MED ORDER — LIDOCAINE 2% (20 MG/ML) 5 ML SYRINGE
INTRAMUSCULAR | Status: DC | PRN
  Administered 2023-02-07: 40 mg via INTRAVENOUS

## 2023-02-07 MED ORDER — BUPROPION HCL ER (XL) 150 MG PO TB24
300.0000 mg | ORAL_TABLET | Freq: Every day | ORAL | Status: DC
  Administered 2023-02-08 – 2023-02-13 (×5): 300 mg via ORAL
  Filled 2023-02-07 (×7): qty 2

## 2023-02-07 MED ORDER — PROPOFOL 500 MG/50ML IV EMUL
INTRAVENOUS | Status: DC | PRN
  Administered 2023-02-07: 80 ug/kg/min via INTRAVENOUS

## 2023-02-07 MED ORDER — ESCITALOPRAM OXALATE 20 MG PO TABS
20.0000 mg | ORAL_TABLET | Freq: Every day | ORAL | Status: DC
  Administered 2023-02-07 – 2023-02-12 (×6): 20 mg via ORAL
  Filled 2023-02-07 (×7): qty 1

## 2023-02-07 MED ORDER — VITAMIN D 25 MCG (1000 UNIT) PO TABS
5000.0000 [IU] | ORAL_TABLET | Freq: Every morning | ORAL | Status: DC
  Administered 2023-02-08 – 2023-02-13 (×5): 5000 [IU] via ORAL
  Filled 2023-02-07 (×7): qty 5

## 2023-02-07 MED ORDER — SODIUM CHLORIDE 0.9 % IV SOLN: INTRAVENOUS | Status: DC | PRN

## 2023-02-07 MED ORDER — MIRTAZAPINE 15 MG PO TABS
15.0000 mg | ORAL_TABLET | Freq: Every day | ORAL | Status: DC
  Administered 2023-02-07 – 2023-02-12 (×6): 15 mg via ORAL
  Filled 2023-02-07 (×6): qty 1

## 2023-02-07 MED ORDER — POTASSIUM CL IN DEXTROSE 5% 20 MEQ/L IV SOLN
20.0000 meq | INTRAVENOUS | Status: AC
  Administered 2023-02-07: 20 meq via INTRAVENOUS
  Filled 2023-02-07 (×2): qty 1000

## 2023-02-07 MED ORDER — CHLORHEXIDINE GLUCONATE CLOTH 2 % EX PADS
6.0000 | MEDICATED_PAD | Freq: Every day | CUTANEOUS | Status: DC
  Administered 2023-02-08 – 2023-02-12 (×5): 6 via TOPICAL

## 2023-02-07 MED ORDER — BUSPIRONE HCL 5 MG PO TABS
20.0000 mg | ORAL_TABLET | Freq: Two times a day (BID) | ORAL | Status: DC
  Administered 2023-02-07 – 2023-02-13 (×11): 20 mg via ORAL
  Filled 2023-02-07 (×13): qty 4

## 2023-02-07 MED ORDER — LINACLOTIDE 72 MCG PO CAPS
72.0000 ug | ORAL_CAPSULE | Freq: Every day | ORAL | Status: DC
  Administered 2023-02-07 – 2023-02-13 (×6): 72 ug via ORAL
  Filled 2023-02-07 (×7): qty 1

## 2023-02-07 MED ORDER — PROPOFOL 10 MG/ML IV BOLUS
INTRAVENOUS | Status: DC | PRN
  Administered 2023-02-07: 50 mg via INTRAVENOUS
  Administered 2023-02-07: 20 mg via INTRAVENOUS

## 2023-02-07 MED ORDER — SODIUM CHLORIDE 0.9 % IV SOLN: INTRAVENOUS | Status: DC

## 2023-02-07 NOTE — Assessment & Plan Note (Signed)
 CA19-9 has increased from normal 4 months ago to 220 this week.

## 2023-02-07 NOTE — Progress Notes (Signed)
 Met with patient and family to discuss upcoming procedure.  Shared that the proposed procedure (thrombectomy and TIPS) could cause potential harm, and the greater chance is that it will provide very limited benefit (in terms of reducing portal HTN, reducing chance of mesenteric ischemia from SMV clot, and reducing bleeding).  Shared the alternative to defer the procedure, treat further bleeding supportively with transfusions, discharge home with outpatient follow up with Oncology for repeat scans.  Prior to this, discussed with her Oncologist who felt that at present no cancer therapy was indicated because she has no definitive evidence of progressive disease.  However, with her current functional status, she would not be a candidate for further chemotherapy if she was found to have progression.  Patient and family undecided.  They wish to speak with IR team tomorrow to discuss risks and decide whether to proceed at that time.  CODE STATUS unchanged at present.

## 2023-02-07 NOTE — Anesthesia Postprocedure Evaluation (Signed)
 Anesthesia Post Note  [name]  Procedure(s) Performed: ESOPHAGOGASTRODUODENOSCOPY (EGD) HOT HEMOSTASIS (ARGON PLASMA COAGULATION/BICAP)     Patient location during evaluation: PACU Anesthesia Type: MAC Level of consciousness: awake and alert Pain management: pain level controlled Vital Signs Assessment: post-procedure vital signs reviewed and stable Respiratory status: spontaneous breathing, nonlabored ventilation and respiratory function stable Cardiovascular status: blood pressure returned to baseline and stable Postop Assessment: no apparent nausea or vomiting Anesthetic complications: no   No notable events documented.  Last Vitals:  Vitals:   02/07/23 1020 02/07/23 1039  BP: (!) 110/57 126/64  Pulse: 84 87  Resp: 17 16  Temp:  36.8 C  SpO2: 95% 97%    Last Pain:  Vitals:   02/07/23 1039  TempSrc:   PainSc: 0-No pain                 Butler Levander Pinal

## 2023-02-07 NOTE — Progress Notes (Signed)
  Progress Note   [name] FMW:[id number] DOB: 1945-09-23 DOA: 02/06/2023     1 DOS: the patient was seen and examined on 02/07/2023 at 11:20 AM      Brief hospital course: 78 y.o. F with pancreatic CA, SMV thrombosis, hx PE on Eliquis , HTN and recurrent GI bleeding who presented with melena again.  Fourth hospitalization in a month for GI bleeding.  Please see excellent summary by Dr. Ricky.       Assessment and Plan: * GI bleed Last Eliquis  was over a week before admission (1/4).  Hemoglobin down to 9.2 from last 10.4  Taken to EGD 1/13 by Dr. Federico, found bleeding GAVE in the antrum treated with APC, also several Forrest class III ulcers in gastric antrum.   -Continue PPI twice daily - Continue sucralfate  - Clear liquid diet - GI recommend to hold anticoagulation for now     Superior mesenteric vein thrombosis (HCC) CT abdomen shows progressive thrombus in SMV into portal vein.  ?clot vs tumor.    The current plan is to plan for TIPS/thrombectomy during this hospitalization with the goal of lowering portal system pressure (to reduce GAVE bleeding), and simultaneously confirm clot vs tumor.  Discussed with Dr. Adele, IR.  TIPS and thrombectomy/biopsy would be a heroic measure with uncertain benefit. Discussed with Dr. Rogers, Oncology.  Currently, patient is not on active cancer treatment given no confirmed progression of disease AND poor functional status.  Even if biopsy of SMV showed tumor thrombus, her functional status makes her not a candidate for chemo at present.  - Consult IR - Consult Palliative Care - [facility]  Malignant neoplasm of pancreas (HCC) CA19-9 has increased from normal 4 months ago to 220 this week.  Essential hypertension BP normal, diet controlled  Depression - Continue bupropion , Buspar , Lexapro  and mirtazapine           Subjective: Patient reports feeling very very weak and tired.  Generalized malaise.  No abdominal  pain.  No vomiting.  No further melena.     Physical Exam: BP 126/64   Pulse 87   Temp 98.2 F (36.8 C) (Oral)   Resp 16   Ht 5' 2 (1.575 m)   Wt 58.6 kg   SpO2 97%   BMI 23.63 kg/m   Thin adult female, lying in bed, appears weak and tired, interactive but fatigued Tachycardic, regular, soft systolic murmur, no peripheral edema, no JVD Respiratory rate normal, lung sounds diminished, respirations shallow, no rales or wheezes Abdomen soft, no tenderness to palpation, no guarding Response to questions, appears tired and listless, generalized weakness is symmetric, oriented to person, place, and time    Data Reviewed: Discussed with oncology and interventional radiology CT abdomen and pelvis showed progression of the SMV thrombus Basic metabolic panel unremarkable CBC shows hemoglobin 10.4 down to 9.2 down to 8.4  Family Communication: Son by phone    Disposition: Status is: Inpatient The patient was admitted for further melena  EGD today showed GAVE bleeding which was treated  We will continue to monitor hemoglobin and transfuse as needed.  In the meantime, decisions about TIPS and thrombectomy are needed.  Palliative care involved, will meet with family today        Author: Lonni SHAUNNA Dalton, MD 02/07/2023 2:36 PM  For on call review [email or web address].

## 2023-02-07 NOTE — Anesthesia Preprocedure Evaluation (Signed)
 Anesthesia Evaluation  Patient identified by MRN, date of birth, ID band Patient awake    Reviewed: Allergy & Precautions, H&P , NPO status , Patient's Chart, lab work & pertinent test results  Airway Mallampati: II  TM Distance: >3 FB Neck ROM: Full    Dental no notable dental hx.    Pulmonary neg pulmonary ROS   Pulmonary exam normal breath sounds clear to auscultation       Cardiovascular hypertension, Pt. on medications Normal cardiovascular exam Rhythm:Regular Rate:Normal     Neuro/Psych   Anxiety Depression    negative neurological ROS  negative psych ROS   GI/Hepatic Neg liver ROS,GERD  ,,  Endo/Other  negative endocrine ROS    Renal/GU negative Renal ROS  negative genitourinary   Musculoskeletal  (+) Arthritis , Osteoarthritis,    Abdominal   Peds negative pediatric ROS (+)  Hematology  (+) Blood dyscrasia, anemia   Anesthesia Other Findings Pancreatic Cancer  Reproductive/Obstetrics negative OB ROS                             Anesthesia Physical Anesthesia Plan  ASA: 3  Anesthesia Plan: MAC   Post-op Pain Management: Minimal or no pain anticipated   Induction: Intravenous  PONV Risk Score and Plan: 2 and Treatment may vary due to age or medical condition  Airway Management Planned: Nasal Cannula  Additional Equipment:   Intra-op Plan:   Post-operative Plan:   Informed Consent: I have reviewed the patients History and Physical, chart, labs and discussed the procedure including the risks, benefits and alternatives for the proposed anesthesia with the patient or authorized representative who has indicated his/her understanding and acceptance.     Dental advisory given  Plan Discussed with: CRNA  Anesthesia Plan Comments:        Anesthesia Quick Evaluation

## 2023-02-07 NOTE — Interval H&P Note (Signed)
 History and Physical Interval Note:  02/07/2023 8:56 AM  Sheryl Bender  has presented today for surgery, with the diagnosis of Acute on chronic anemia, GI bleed, history of ulcers, gastritis, and Dieulafoy.  The various methods of treatment have been discussed with the patient and family. After consideration of risks, benefits and other options for treatment, the patient has consented to  Procedure(s): ESOPHAGOGASTRODUODENOSCOPY (EGD) (N/A) as a surgical intervention.  The patient's history has been reviewed, patient examined, no change in status, stable for surgery.  I have reviewed the patient's chart and labs.  Questions were answered to the patient's satisfaction.     [name] C Bastian Andreoli

## 2023-02-07 NOTE — Transfer of Care (Signed)
 Immediate Anesthesia Transfer of Care Note  [name]  Procedure(s) Performed: ESOPHAGOGASTRODUODENOSCOPY (EGD) HOT HEMOSTASIS (ARGON PLASMA COAGULATION/BICAP)  Patient Location: PACU and Endoscopy Unit  Anesthesia Type:MAC  Level of Consciousness: awake and patient cooperative  Airway & Oxygen Therapy: Patient Spontanous Breathing and Patient connected to nasal cannula oxygen  Post-op Assessment: Report given to RN and Post -op Vital signs reviewed and stable  Post vital signs: Reviewed and stable  Last Vitals:  Vitals Value Taken Time  BP    Temp    Pulse    Resp    SpO2      Last Pain:  Vitals:   02/07/23 0835  TempSrc: Temporal  PainSc: 0-No pain         Complications: No notable events documented.

## 2023-02-07 NOTE — Plan of Care (Signed)

## 2023-02-07 NOTE — ED Notes (Signed)
 Pt placed on 2L [place] while sleeping due to O2 sats ranging from 90-91%.

## 2023-02-07 NOTE — Hospital Course (Signed)
 78 y.o. F with pancreatic CA, SMV thrombosis, hx PE on Eliquis, HTN and recurrent GI bleeding who presented with melena again.  Fourth hospitalization in a month for GI bleeding.  Please see excellent summary by Dr. Cyndia Bent.

## 2023-02-07 NOTE — Progress Notes (Signed)
   02/07/23 1500  TOC Brief Assessment  Insurance and Status Reviewed [employer])  Patient has primary care physician Yes Aram, Rachel, DO)  Home environment has been reviewed From Home  Prior level of function: independent  Prior/Current Home Services Current home services (Was with [facility])  Social Drivers of Health Review SDOH reviewed no interventions necessary  Readmission risk has been reviewed Yes (29%)  Transition of care needs no transition of care needs at this time    Please place consult for any TOC needs

## 2023-02-07 NOTE — Assessment & Plan Note (Signed)
 BP normal, diet controlled

## 2023-02-07 NOTE — Op Note (Signed)
 [facility] Patient Name: Sheryl Bender Procedure Date : 02/07/2023 MRN: [id number] Attending MD: Rosario Estefana Kidney , , [id number] Date of Birth: July 22, 1945 CSN: [id number] Age: 78 Admit Type: Inpatient Procedure:                Upper GI endoscopy Indications:              Melena, Anemia Providers:                Rosario Estefana Kidney Hoy Lebron, RN, Saralyn Greener, Technician Referring MD:             Hospitalist team Medicines:                Monitored Anesthesia Care Complications:            No immediate complications. Estimated Blood Loss:     Estimated blood loss was minimal. Procedure:                Pre-Anesthesia Assessment:                           - Prior to the procedure, a History and Physical                            was performed, and patient medications and                            allergies were reviewed. The patient's tolerance of                            previous anesthesia was also reviewed. The risks                            and benefits of the procedure and the sedation                            options and risks were discussed with the patient.                            All questions were answered, and informed consent                            was obtained. Prior Anticoagulants: The patient has                            taken Eliquis  (apixaban ), last dose was 9 days                            prior to procedure. ASA Grade Assessment: III - A                            patient with severe systemic disease. After  reviewing the risks and benefits, the patient was                            deemed in satisfactory condition to undergo the                            procedure.                           After obtaining informed consent, the endoscope was                            passed under direct vision. Throughout the                            procedure, the patient's  blood pressure, pulse, and                            oxygen saturations were monitored continuously. The                            [id number]-H190 ([id number]) Olympus endoscope was introduced                            through the mouth, and advanced to the second part                            of duodenum. The upper GI endoscopy was                            accomplished without difficulty. The patient                            tolerated the procedure well. Scope In: Scope Out: Findings:      The examined esophagus was normal.      A hiatal hernia was present.      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 Coagulation for bleeding prevention using argon plasma at 1       liter/minute and 20 watts was successful.      Five non-bleeding cratered gastric ulcers with a clean ulcer base       (Forrest Class III) were found in the gastric antrum.      Localized erythematous mucosa without active bleeding and with no       stigmata of bleeding was found in the duodenal bulb. Impression:               - Normal esophagus.                           - Hiatal hernia.                           - Gastric antral vascular ectasia with bleeding.  Treated with argon plasma coagulation (APC).                           - Non-bleeding gastric ulcers with a clean ulcer                            base (Forrest Class III).                           - Erythematous duodenopathy.                           - No specimens collected. Recommendation:           - Return patient to hospital ward for ongoing care.                           - IR consult to discuss TIPS as an inpatient.                           - PPI BID.                           - Sucralfate  QID for 4 weeks.                           - The findings and recommendations were discussed                            with the patient. Procedure Code(s):        --- Professional ---                            [phone], Esophagogastroduodenoscopy, flexible,                            transoral; with control of bleeding, any method Diagnosis Code(s):        --- Professional ---                           K44.9, Diaphragmatic hernia without obstruction or                            gangrene                           K31.811, Angiodysplasia of stomach and duodenum                            with bleeding                           K25.9, Gastric ulcer, unspecified as acute or                            chronic, without hemorrhage or perforation  K31.89, Other diseases of stomach and duodenum                           K92.1, Melena (includes Hematochezia) CPT copyright 2022 [employer]. All rights reserved. The codes documented in this report are preliminary and upon coder review may  be revised to meet current compliance requirements. Dr Estefana Federico Rosario Estefana Federico,  02/07/2023 10:10:28 AM Number of Addenda: 0

## 2023-02-08 ENCOUNTER — Other Ambulatory Visit: Payer: Self-pay | Admitting: Radiology

## 2023-02-08 ENCOUNTER — Inpatient Hospital Stay: Payer: Medicare Other | Admitting: Hematology

## 2023-02-08 DIAGNOSIS — C259 Malignant neoplasm of pancreas, unspecified: Secondary | ICD-10-CM | POA: Diagnosis not present

## 2023-02-08 DIAGNOSIS — K55069 Acute infarction of intestine, part and extent unspecified: Secondary | ICD-10-CM

## 2023-02-08 DIAGNOSIS — Z515 Encounter for palliative care: Secondary | ICD-10-CM

## 2023-02-08 DIAGNOSIS — K2901 Acute gastritis with bleeding: Secondary | ICD-10-CM | POA: Diagnosis not present

## 2023-02-08 DIAGNOSIS — Z7189 Other specified counseling: Secondary | ICD-10-CM

## 2023-02-08 DIAGNOSIS — Z66 Do not resuscitate: Secondary | ICD-10-CM

## 2023-02-08 LAB — CBC
HCT: 25.3 % — ABNORMAL LOW (ref 36.0–46.0)
Hemoglobin: 8 g/dL — ABNORMAL LOW (ref 12.0–15.0)
MCH: 29.6 pg (ref 26.0–34.0)
MCHC: 31.6 g/dL (ref 30.0–36.0)
MCV: 93.7 fL (ref 80.0–100.0)
Platelets: 133 10*3/uL — ABNORMAL LOW (ref 150–400)
RBC: 2.7 MIL/uL — ABNORMAL LOW (ref 3.87–5.11)
RDW: 16.2 % — ABNORMAL HIGH (ref 11.5–15.5)
WBC: 3.5 10*3/uL — ABNORMAL LOW (ref 4.0–10.5)
nRBC: 0 % (ref 0.0–0.2)

## 2023-02-08 LAB — COMPREHENSIVE METABOLIC PANEL
ALT: 28 U/L (ref 0–44)
AST: 23 U/L (ref 15–41)
Albumin: 2.5 g/dL — ABNORMAL LOW (ref 3.5–5.0)
Alkaline Phosphatase: 89 U/L (ref 38–126)
Anion gap: 5 (ref 5–15)
BUN: 9 mg/dL (ref 8–23)
CO2: 23 mmol/L (ref 22–32)
Calcium: 8.4 mg/dL — ABNORMAL LOW (ref 8.9–10.3)
Chloride: 112 mmol/L — ABNORMAL HIGH (ref 98–111)
Creatinine, Ser: 0.73 mg/dL (ref 0.44–1.00)
GFR, Estimated: >60 mL/min (ref >60)
Glucose, Bld: 136 mg/dL — ABNORMAL HIGH (ref 70–99)
Potassium: 3.6 mmol/L (ref 3.5–5.1)
Sodium: 140 mmol/L (ref 135–145)
Total Bilirubin: 0.6 mg/dL (ref 0.0–1.2)
Total Protein: 5 g/dL — ABNORMAL LOW (ref 6.5–8.1)

## 2023-02-08 MED ORDER — SUCRALFATE 1 GM/10ML PO SUSP
1.0000 g | Freq: Three times a day (TID) | ORAL | Status: DC
  Administered 2023-02-08 – 2023-02-13 (×18): 1 g via ORAL
  Filled 2023-02-08 (×17): qty 10

## 2023-02-08 MED ORDER — SODIUM CHLORIDE 0.9% FLUSH: 10.0000 mL | INTRAVENOUS | Status: DC | PRN

## 2023-02-08 MED ORDER — SODIUM CHLORIDE 0.9% FLUSH
10.0000 mL | Freq: Two times a day (BID) | INTRAVENOUS | Status: DC
  Administered 2023-02-09 – 2023-02-12 (×8): 10 mL

## 2023-02-08 MED ORDER — CEFTRIAXONE SODIUM 2 G IJ SOLR
2.0000 g | INTRAMUSCULAR | Status: AC
  Administered 2023-02-09: 2 g via INTRAVENOUS
  Filled 2023-02-08: qty 20

## 2023-02-08 NOTE — Consult Note (Signed)
 Chief Complaint: Patient was seen in consultation today for  Chief Complaint  Patient presents with   Abdominal Pain   Weakness   Referring Physician(s): Dr. Federico   Supervising Physician: Jennefer Rover  Patient Status: [facility] - In-pt  History of Present Illness: Sheryl Bender is a 78 y.o. female with a medical history significant for pancreatic cancer s/p chemoradiation, pulmonary embolism on Eliquis , anemia, interstitial pancreatitis, HTN and recurrent GI bleeding. She has been hospitalized multiple times in the past few months for GI bleeding and has undergone several endoscopic exams with interventions for bleeding. 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 IR was consulted for TIPS with portal thrombectomy for SMV and main portal thrombus, likely secondary to prior radiation to the pancreas. The patient was scheduled as an outpatient 02/15/23 with Dr. Jennefer.   She presented to the ED 02/06/23 with melena and weakness. Imaging showed the thrombus in the SMV had progressed and was now occlusive. The thrombus in the portal vein had also progressed.   CT abdomen/pelvis 02/06/23 IMPRESSION: 1. Progressive thrombus within the superior mesenteric vein over the last 8 days, now occlusive. Progressive thrombus within the main and intrahepatic portal veins. 2. Known hypodense pancreatic mass is grossly unchanged measuring 2.2 cm. This is less well-defined than on prior exam. 3. Peripancreatic fat stranding is mild. 4. Persistent and progressive wall thickening about the distal stomach, duodenum, and hepatic flexure of the colon. Progressive right upper quadrant fat stranding. Etiology of bowel wall thickening and inflammation is  indeterminate. 5. Increasing portosystemic collaterals. Paraesophageal varices.  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 Five non-bleeding cratered gastric ulcers were also identified.   Interventional Radiology has been consulted for an inpatient TIPS with thrombectomy. Imaging reviewed and procedure approved by Dr. Jennefer.   Past Medical History:  Diagnosis Date   Anxiety    Arthritis    Depression    Dysrhythmia    hx palpitations greater than 5 yrs -neg echo, stress per pt ? where or dr   GERD (gastroesophageal reflux disease)    occ   Nausea & vomiting 08/24/2021   Pancreatic adenocarcinoma (HCC)    Pancreatic pseudocyst    Port-A-Cath in place 09/15/2021   Pulmonary embolism [facility])    September 2022    Past Surgical History:  Procedure Laterality Date   ANTERIOR LAT LUMBAR FUSION Right 12/16/2015   Procedure: RIGHT LUMBAR TWO-THREE, LUMBAR THREE-FOUR, LUMBAR FOUR-FIVE ANTEROLATERAL LUMBAR INTERBODY FUSION;  Surgeon: Fairy Levels, MD;  Location: [facility] OR;  Service: Neurosurgery;  Laterality: Right;   BALLOON DILATION N/A 08/27/2021   Procedure: BALLOON DILATION;  Surgeon: Wilhelmenia Aloha Raddle., MD;  Location: [facility];  Service: Gastroenterology;  Laterality: N/A;   BIOPSY  07/09/2021   Procedure: BIOPSY;  Surgeon: Wilhelmenia Aloha Raddle., MD;  Location: [facility] ENDOSCOPY;  Service: Gastroenterology;;   COLONOSCOPY WITH PROPOFOL  N/A 12/29/2022   Procedure: COLONOSCOPY WITH PROPOFOL ;  Surgeon: Shaaron Lamar HERO, MD;  Location: [facility];  Service: Endoscopy;  Laterality: N/A;   CYST GASTROSTOMY  08/27/2021   Procedure: CYST GASTROSTOMY;  Surgeon: Wilhelmenia Aloha Raddle., MD;  Location: [facility] ENDOSCOPY;  Service: Gastroenterology;;   ESOPHAGOGASTRODUODENOSCOPY N/A 07/09/2021   Procedure: ESOPHAGOGASTRODUODENOSCOPY (EGD);  Surgeon: Wilhelmenia Aloha  Mickey., MD;  Location: [facility] ENDOSCOPY;  Service: Gastroenterology;   Laterality: N/A;   ESOPHAGOGASTRODUODENOSCOPY (EGD) WITH PROPOFOL  N/A 08/27/2021   Procedure: ESOPHAGOGASTRODUODENOSCOPY (EGD) WITH PROPOFOL ;  Surgeon: Wilhelmenia Aloha Mickey., MD;  Location: [facility] ENDOSCOPY;  Service: Gastroenterology;  Laterality: N/A;   ESOPHAGOGASTRODUODENOSCOPY (EGD) WITH PROPOFOL  N/A 10/08/2021   Procedure: ESOPHAGOGASTRODUODENOSCOPY (EGD) WITH PROPOFOL ;  Surgeon: Wilhelmenia Aloha Mickey., MD;  Location: [facility] ENDOSCOPY;  Service: Gastroenterology;  Laterality: N/A;   ESOPHAGOGASTRODUODENOSCOPY (EGD) WITH PROPOFOL  N/A 12/29/2022   Procedure: ESOPHAGOGASTRODUODENOSCOPY (EGD) WITH PROPOFOL ;  Surgeon: Shaaron Lamar HERO, MD;  Location: [facility];  Service: Endoscopy;  Laterality: N/A;   ESOPHAGOGASTRODUODENOSCOPY (EGD) WITH PROPOFOL  N/A 01/13/2023   Procedure: ESOPHAGOGASTRODUODENOSCOPY (EGD) WITH PROPOFOL ;  Surgeon: Cinderella Deatrice FALCON, MD;  Location: [facility];  Service: Endoscopy;  Laterality: N/A;   ESOPHAGOGASTRODUODENOSCOPY (EGD) WITH PROPOFOL  N/A 01/30/2023   Procedure: ESOPHAGOGASTRODUODENOSCOPY (EGD) WITH PROPOFOL ;  Surgeon: Cindie Carlin POUR, DO;  Location: [facility];  Service: Endoscopy;  Laterality: N/A;   EUS N/A 07/09/2021   Procedure: UPPER ENDOSCOPIC ULTRASOUND (EUS) RADIAL;  Surgeon: Wilhelmenia Aloha Mickey., MD;  Location: [facility] ENDOSCOPY;  Service: Gastroenterology;  Laterality: N/A;   EUS N/A 08/27/2021   Procedure: UPPER ENDOSCOPIC ULTRASOUND (EUS) LINEAR;  Surgeon: Wilhelmenia Aloha Mickey., MD;  Location: [facility];  Service: Gastroenterology;  Laterality: N/A;   FINE NEEDLE ASPIRATION  07/09/2021   Procedure: FINE NEEDLE ASPIRATION (FNA) LINEAR;  Surgeon: Wilhelmenia Aloha Mickey., MD;  Location: [facility] ENDOSCOPY;  Service: Gastroenterology;;   HEMOSTASIS CLIP PLACEMENT  12/29/2022   Procedure: HEMOSTASIS CLIP PLACEMENT;  Surgeon: Shaaron Lamar HERO, MD;  [facility];  Service: Endoscopy;;   HOT HEMOSTASIS  12/29/2022   Procedure: HOT HEMOSTASIS (ARGON PLASMA  COAGULATION/BICAP);  Surgeon: Shaaron Lamar HERO, MD;  [facility];  Service: Endoscopy;;   HOT HEMOSTASIS  01/13/2023   Procedure: HOT HEMOSTASIS (ARGON PLASMA COAGULATION/BICAP);  Surgeon: Cinderella Deatrice FALCON, MD;  Location: [facility];  Service: Endoscopy;;  used apc and goldprobe   HOT HEMOSTASIS  01/30/2023   Procedure: HOT HEMOSTASIS (ARGON PLASMA COAGULATION/BICAP);  Surgeon: Cindie Carlin POUR, DO;  [facility]: [facility];  Service: Endoscopy;;   IR IMAGING GUIDED PORT INSERTION  09/14/2021   LAPAROSCOPY N/A 02/15/2022   Procedure: STAGING DIAGNOSTIC;  Surgeon: Dasie Leonor CROME, MD;  Location: [facility] OR;  Service: General;  Laterality: N/A;   LUMBAR PERCUTANEOUS PEDICLE SCREW 3 LEVEL Bilateral 12/16/2015   Procedure: PERCUTANEOUS PEDICLE SCREWS BILATERALLY AT LUMBAR TWO-FIVE;  Surgeon: Fairy Levels, MD;  Location[facility];  Service: Neurosurgery;  Laterality: Bilateral;   PANCREATIC STENT PLACEMENT  08/27/2021   Procedure: PANCREATIC STENT PLACEMENT;  Surgeon: Wilhelmenia Aloha Mickey., MD;  Location: [facility];  Service: Gastroenterology;;   POLYPECTOMY  12/29/2022   Procedure: POLYPECTOMY INTESTINAL;  Surgeon: Shaaron Lamar HERO, MD;  [facility];  Service: Endoscopy;;   STENT REMOVAL  10/08/2021   Procedure: STENT REMOVAL;  Surgeon: Wilhelmenia Aloha Mickey., MD;  Location: [facility];  Service: Gastroenterology;;   TUBAL LIGATION  1974   WHIPPLE PROCEDURE N/A 02/15/2022   Procedure: ATTEMPTED WHIPPLE PROCEDURE;  Surgeon: Dasie Leonor CROME, MD;  Location: [facility] OR;  Service: General;  Laterality: N/A;    Allergies: Other, Cherry, and Wound dressing adhesive  Medications: Prior to Admission medications   Medication Sig Start Date End Date Taking? Authorizing Provider  acetaminophen  (TYLENOL ) 500 MG tablet Take 2 tablets (1,000 mg total) by mouth every 8 (eight) hours as needed for  moderate pain, headache or fever. Patient taking differently: Take 750-1,000 mg by mouth every 8  (eight) hours as needed for moderate pain (pain score 4-6), headache or fever. 07/21/21  Yes Ricky Fines, MD  buPROPion  (WELLBUTRIN  XL) 300 MG 24 hr tablet Take 300 mg by mouth daily after breakfast. Takes differently from prescribed: 20mg  BID 06/15/21  Yes [provider]  busPIRone  (BUSPAR ) 10 MG tablet Take 20 mg by mouth 2 (two) times daily. 01/27/21  Yes [provider]  Cholecalciferol  (VITAMIN D3) 125 MCG (5000 UT) CAPS Take 5,000 Units by mouth every morning.   Yes [provider]  escitalopram  (LEXAPRO ) 20 MG tablet Take 20 mg by mouth at bedtime.   Yes [provider]  lactose free nutrition (BOOST) LIQD Take 237 mLs by mouth 2 (two) times daily between meals.   Yes [provider]  LINZESS  72 MCG capsule TAKE 1 CAPSULE BY MOUTH DAILY BEFORE BREAKFAST Patient taking differently: Take 72 mcg by mouth daily before breakfast. 01/20/23  Yes Mansouraty, Aloha Raddle., MD  mirtazapine  (REMERON ) 30 MG tablet Take 15 mg by mouth at bedtime.   Yes [provider]  pantoprazole  (PROTONIX ) 40 MG tablet Take 1 tablet (40 mg total) by mouth 2 (two) times daily. 02/02/23 05/03/23 Yes Laurence Locus, DO  QUEtiapine  (SEROQUEL ) 25 MG tablet Take 25 mg by mouth 2 (two) times daily. 12/14/22  Yes [provider]  lidocaine -prilocaine  (EMLA ) cream Apply a small amount to port a cath site (do not rub in) and cover with plastic wrap 1 hour prior to infusion appointments 09/15/21   Rogers Hai, MD  ondansetron  (ZOFRAN ) 4 MG tablet Take 1 tablet (4 mg total) by mouth every 6 (six) hours as needed for nausea or vomiting. 02/02/23 03/04/23  Laurence Locus, DO     Family History  Problem Relation Age of Onset   Pancreatitis Brother        alcoholic pancreatitis   Pancreatic cancer Neg Hx    Colon cancer Neg Hx     Social History   Socioeconomic History   Marital status: Widowed    Spouse name: Not on file   Number of children: Not on file   Years of  education: Not on file   Highest education level: Not on file  Occupational History   Not on file  Tobacco Use   Smoking status: Never   Smokeless tobacco: Never  Vaping Use   Vaping status: Never Used  Substance and Sexual Activity   Alcohol  use: No   Drug use: Never   Sexual activity: Not on file  Other Topics Concern   Not on file  Social History Narrative   Not on file   Social Drivers of Health   Financial Resource Strain: Not on file  Food Insecurity: No Food Insecurity (02/06/2023)   Hunger Vital Sign    Worried About Running Out of Food in the Last Year: Never true    Ran Out of Food in the Last Year: Never true  Transportation Needs: No Transportation Needs (02/06/2023)   PRAPARE - Administrator, Civil Service (Medical): No    Lack of Transportation (Non-Medical): No  Physical Activity: Not on file  Stress: Not on file  Social Connections: Moderately Integrated (02/06/2023)   Social Connection and Isolation Panel [NHANES]    Frequency of Communication with Friends and Family: More than three times a week    Frequency of Social Gatherings with Friends and Family: Three times  a week    Attends Religious Services: More than 4 times per year    Active Member of Clubs or Organizations: Yes    Attends Banker Meetings: More than 4 times per year    Marital Status: Widowed    Review of Systems: A 12 point ROS discussed and pertinent positives are indicated in the HPI above.  All other systems are negative.  Review of Systems  Constitutional:  Positive for appetite change and fatigue.  Respiratory:  Positive for shortness of breath. Negative for cough.   Gastrointestinal:  Positive for abdominal pain, constipation and diarrhea.  Neurological:  Positive for weakness and headaches. Negative for dizziness.    Vital Signs: BP (!) 104/58 (BP Location: Left Arm)   Pulse 78   Temp 98.2 F (36.8 C) (Oral)   Resp (!) 22   Ht 5' 2 (1.575 m)   Wt  129 lb 3.2 oz (58.6 kg)   SpO2 100%   BMI 23.63 kg/m   Physical Exam Constitutional:      General: She is not in acute distress. HENT:     Ears:     Comments: Hard of hearing    Mouth/Throat:     Mouth: Mucous membranes are moist.     Pharynx: Oropharynx is clear.  Cardiovascular:     Rate and Rhythm: Normal rate and regular rhythm.     Pulses: Normal pulses.     Heart sounds: Normal heart sounds.  Pulmonary:     Effort: Pulmonary effort is normal.     Breath sounds: Normal breath sounds.  Abdominal:     General: Bowel sounds are normal.     Palpations: Abdomen is soft.     Tenderness: There is abdominal tenderness.  Musculoskeletal:     Right lower leg: No edema.     Left lower leg: No edema.  Skin:    General: Skin is warm and dry.  Neurological:     Mental Status: She is alert and oriented to person, place, and time.  Psychiatric:        Mood and Affect: Mood normal.        Behavior: Behavior normal.        Thought Content: Thought content normal.        Judgment: Judgment normal.     Imaging: CT ABDOMEN PELVIS W CONTRAST Result Date: 02/06/2023 CLINICAL DATA:  Pancreatitis, acute, severe Patient presents with abdominal pain. Radiologic records indicates history of pancreatic cancer. EXAM: CT ABDOMEN AND PELVIS WITH CONTRAST TECHNIQUE: Multidetector CT imaging of the abdomen and pelvis was performed using the standard protocol following bolus administration of intravenous contrast. RADIATION DOSE REDUCTION: This exam was performed according to the departmental dose-optimization program which includes automated exposure control, adjustment of the mA and/or kV according to patient size and/or use of iterative reconstruction technique. CONTRAST:  75mL OMNIPAQUE  IOHEXOL  350 MG/ML SOLN COMPARISON:  CT 8 days ago.  PET CT 10/28/2022 reviewed FINDINGS: Lower chest: Again seen linear atelectasis or scarring in the left greater than right lower lobe. Small fat containing [place]  hernia on the left. No pleural effusion. Hepatobiliary: Tiny hypodensity in the right lobe of the liver is unchanged series 3, image 9. No new intrahepatic abnormality. There is progressive portal vein thrombus, increasing volume of intrahepatic portal vein thrombus. The gallbladder is not well seen. Common bile duct is poorly defined on the current exam. Pancreas: Pancreatic atrophy with ductal dilatation, unchanged over the last 8 days. The known  hypodense pancreatic mass is grossly unchanged measuring 2.2 cm series 3, image 23. This is less well-defined than on prior exam. Lesion abuts the SMV with progressive SMV thrombus. Peripancreatic fat stranding about the pancreatic head is minor. Spleen: Bilobed appearance of the spleen. No focal splenic abnormality. Adrenals/Urinary Tract: No adrenal nodule. Bilateral parapelvic cysts. No further follow-up imaging is recommended. No hydronephrosis or renal inflammation. Moderate bladder distension, no wall thickening. Stomach/Bowel: Detailed bowel assessment is limited in the absence of enteric contrast. Paraesophageal varices. Wall thickening about the distal stomach, reference series 3, image 21. Again seen wall thickening of the duodenum with adjacent stranding, for example series 3, image 25. No small bowel obstruction or additional small bowel wall thickening. No small bowel pneumatosis. There is wall thickening about the hepatic flexure of the colon series 3, image 26, increased from prior exam. Transverse colon is redundant. Small to moderate colonic stool burden. Vascular/Lymphatic: Progressive thrombus within the superior mesenteric vein which is now occlusive. Progressive thrombus within the main and intrahepatic portal veins. The splenic vein remains patent. Increasing portosystemic collaterals. Aortic atherosclerosis. No bulky adenopathy. Reproductive: Nonacute. Other: Right upper quadrant fat stranding which is adjacent to gastric, duodenal and colonic wall  thickening. No frank ascites. No free air. Right lateral lumbar hernia contains only fat. Musculoskeletal: Posterior rod with intrapedicular screw fusion L2 through L5. Additional multilevel degenerative change in the spine. No evidence of focal bone lesion. IMPRESSION: 1. Progressive thrombus within the superior mesenteric vein over the last 8 days, now occlusive. Progressive thrombus within the main and intrahepatic portal veins. 2. Known hypodense pancreatic mass is grossly unchanged measuring 2.2 cm. This is less well-defined than on prior exam. 3. Peripancreatic fat stranding is mild. 4. Persistent and progressive wall thickening about the distal stomach, duodenum, and hepatic flexure of the colon. Progressive right upper quadrant fat stranding. Etiology of bowel wall thickening and inflammation is indeterminate. 5. Increasing portosystemic collaterals. Paraesophageal varices. Aortic Atherosclerosis (ICD10-I70.0). Electronically Signed   By: Sydney Gasman M.D.   On: 02/06/2023 17:21   ECHOCARDIOGRAM COMPLETE Result Date: 02/01/2023    ECHOCARDIOGRAM REPORT   Patient Name:   Sheryl Bender Date of Exam: 02/01/2023 Medical Rec #:  [id number]         Height:       62.5 in Accession #:    [id number]        Weight:       132.0 lb Date of Birth:  [birth year]        BSA:          1.612 m Patient Age:    77 years          BP:           133/76 mmHg Patient Gender: F                 HR:           73 bpm. Exam Location:  Inpatient Procedure: 2D Echo, Color Doppler and Cardiac Doppler Indications:    Pre-op exam (TIPS)  History:        Patient has prior history of Echocardiogram examinations, most                 recent 03/18/2014. Risk Factors:Hypertension.  Sonographer:    Damien Senior RDCS Referring Phys: [id number] TOYA DEL HAN IMPRESSIONS  1. Left ventricular ejection fraction, by estimation, is 55 to 60%. The left ventricle has normal function. The left ventricle has no regional  wall motion abnormalities. Left  ventricular diastolic parameters are consistent with Grade I diastolic dysfunction (impaired relaxation).  2. Right ventricular systolic function is normal. The right ventricular size is normal. Tricuspid regurgitation signal is inadequate for assessing PA pressure.  3. A small pericardial effusion is present. The pericardial effusion is anterior to the right ventricle. There is no evidence of cardiac tamponade.  4. The mitral valve is normal in structure. Trivial mitral valve regurgitation. No evidence of mitral stenosis.  5. The aortic valve is tricuspid. There is mild calcification of the aortic valve. Aortic valve regurgitation is trivial. Aortic valve sclerosis is present, with no evidence of aortic valve stenosis.  6. The inferior vena cava is normal in size with greater than 50% respiratory variability, suggesting right atrial pressure of 3 mmHg. FINDINGS  Left Ventricle: Left ventricular ejection fraction, by estimation, is 55 to 60%. The left ventricle has normal function. The left ventricle has no regional wall motion abnormalities. The left ventricular internal cavity size was normal in size. There is  no left ventricular hypertrophy. Left ventricular diastolic parameters are consistent with Grade I diastolic dysfunction (impaired relaxation). Right Ventricle: The right ventricular size is normal. No increase in right ventricular wall thickness. Right ventricular systolic function is normal. Tricuspid regurgitation signal is inadequate for assessing PA pressure. Left Atrium: Left atrial size was normal in size. Right Atrium: Right atrial size was normal in size. Pericardium: A small pericardial effusion is present. The pericardial effusion is anterior to the right ventricle. There is no evidence of cardiac tamponade. Presence of epicardial fat layer. Mitral Valve: The mitral valve is normal in structure. Trivial mitral valve regurgitation. No evidence of mitral valve stenosis. Tricuspid Valve: The  tricuspid valve is normal in structure. Tricuspid valve regurgitation is trivial. No evidence of tricuspid stenosis. Aortic Valve: The aortic valve is tricuspid. There is mild calcification of the aortic valve. Aortic valve regurgitation is trivial. Aortic valve sclerosis is present, with no evidence of aortic valve stenosis. Pulmonic Valve: The pulmonic valve was normal in structure. Pulmonic valve regurgitation is trivial. No evidence of pulmonic stenosis. Aorta: The aortic root and ascending aorta are structurally normal, with no evidence of dilitation. Venous: The inferior vena cava is normal in size with greater than 50% respiratory variability, suggesting right atrial pressure of 3 mmHg. IAS/Shunts: The atrial septum is grossly normal.  LEFT VENTRICLE PLAX 2D LVIDd:         4.10 cm   Diastology LVIDs:         2.90 cm   LV e' medial:    4.68 cm/s LV PW:         0.80 cm   LV E/e' medial:  12.0 LV IVS:        0.70 cm   LV e' lateral:   9.46 cm/s LVOT diam:     2.00 cm   LV E/e' lateral: 5.9 LV SV:         47 LV SV Index:   29 LVOT Area:     3.14 cm  RIGHT VENTRICLE RV S prime:     12.80 cm/s TAPSE (M-mode): 1.9 cm LEFT ATRIUM             Index        RIGHT ATRIUM           Index LA diam:        2.60 cm 1.61 cm/m   RA Area:     11.70 cm LA Vol (  A2C):   33.8 ml 20.97 ml/m  RA Volume:   24.50 ml  15.20 ml/m LA Vol (A4C):   35.7 ml 22.15 ml/m LA Biplane Vol: 36.7 ml 22.77 ml/m  AORTIC VALVE LVOT Vmax:   69.80 cm/s LVOT Vmean:  52.400 cm/s LVOT VTI:    0.151 m  AORTA Ao Root diam: 3.10 cm Ao Asc diam:  3.20 cm MITRAL VALVE MV Area (PHT): 2.27 cm    SHUNTS MV E velocity: 56.10 cm/s  Systemic VTI:  0.15 m MV A velocity: 79.70 cm/s  Systemic Diam: 2.00 cm MV E/A ratio:  0.70 Darryle Decent MD Electronically signed by Darryle Decent MD Signature Date/Time: 02/01/2023/10:35:33 AM    Final    CT ABDOMEN PELVIS W CONTRAST Result Date: 01/29/2023 CLINICAL DATA:  Dark red rectal bleeding since last Thursday. Pancreatic  cancer. * Tracking Code: BO * EXAM: CT ABDOMEN AND PELVIS WITH CONTRAST TECHNIQUE: Multidetector CT imaging of the abdomen and pelvis was performed using the standard protocol following bolus administration of intravenous contrast. RADIATION DOSE REDUCTION: This exam was performed according to the departmental dose-optimization program which includes automated exposure control, adjustment of the mA and/or kV according to patient size and/or use of iterative reconstruction technique. CONTRAST:  OMNIPAQUE  IOHEXOL  300 MG/ML  SOLN COMPARISON:  CTA 01/12/2023. Older standard CT scans as well including September 2024. PET-CT 10/28/2022. MRI 10/18/2022. FINDINGS: Lower chest: Linear opacity lung bases likely scar or atelectasis. No pleural effusion. Hepatobiliary: Tiny right-sided dome hepatic cystic focus, unchanged from previous, nonspecific. 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 Pancreas: Severe pancreatic atrophy with ductal dilatation. There is transition point along a low-density mass seen along the head and uncinate process. This mass on series 2, image 27 measures 2.3 by 1.7 cm and is more defined today and larger than previous examinations. This has abuts the SMV. There is some progressive thrombus within the upper SMV which is nonocclusive. Preserved tissue plane to the celiac and SMA. See coronal image 32 of series 4. Spleen: Slightly lobular spleen which is nonenlarged. Adrenals/Urinary Tract: Prominent bilateral parapelvic renal cysts bilaterally. Small parenchymal cysts. No enhancing mass or collecting system dilatation. Preserved contours of the urinary bladder. Stomach/Bowel: Once again there is significant wall thickening and edema along the antrum and pylorus of the stomach. Please correlate for gastritis. There is some increasing adjacent stranding. Stomach is  nondilated. Small bowel is nondilated. Large bowel has a normal course and caliber with some scattered colonic stool. There is slight wall thickening along portion of the hepatic flexure in the porta hepatis with some adjacent increasing fluid. Normal appendix seen in the right lower quadrant. Vascular/Lymphatic: Aortic atherosclerosis. No enlarged abdominal or pelvic lymph nodes. Reproductive: Uterus and bilateral adnexa are unremarkable. Other: No free intra-air. Musculoskeletal: Streak artifact related to the significant hardware along the lumbar spine. Other areas of advanced degenerative changes along the thoracolumbar region. Degenerative changes of the pelvis. Critical Value/emergent results were called by telephone at the time of interpretation on 01/29/2023 at 1:45 pm to provider [facility] , who verbally acknowledged these results. 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 Again there is a low-density masslike area along the uncinate process  with associated severe ductal dilatation in caliber change. This has consistent with patient's history of pancreatic neoplasm. No developing lymph node enlargement or new liver lesion at this time. Progressive wall thickening along the distal stomach towards the antrum and pylorus with the edema. Please correlate for peptic ulcer disease or other process. Increasing adjacent stranding and trace fluid extending towards the porta hepatis. Electronically Signed   By: Ranell Bring M.D.   On: 01/29/2023 13:53   CT ANGIO GI BLEED Result Date: 01/12/2023 CLINICAL DATA:  Abdominal pain, melena, history of pancreatic cancer * Tracking Code: BO * EXAM: CTA ABDOMEN AND PELVIS WITHOUT AND WITH CONTRAST TECHNIQUE: Multidetector CT imaging of the abdomen and pelvis was performed using the  standard protocol during bolus administration of intravenous contrast. Multiplanar reconstructed images and MIPs were obtained and reviewed to evaluate the vascular anatomy. RADIATION DOSE REDUCTION: This exam was performed according to the departmental dose-optimization program which includes automated exposure control, adjustment of the mA and/or kV according to patient size and/or use of iterative reconstruction technique. CONTRAST:  75mL OMNIPAQUE  IOHEXOL  350 MG/ML SOLN COMPARISON:  PET-CT, 10/28/2022, CT abdomen pelvis, 09/30/2022 FINDINGS: VASCULAR Normal contour and caliber of the abdominal aorta. No evidence of aneurysm, dissection, or other acute aortic pathology. Standard branching pattern of the abdominal aorta with solitary bilateral renal arteries. Moderate mixed calcific atherosclerosis. Review of the MIP images confirms the above findings. NON-VASCULAR Lower Chest: No acute findings.  Coronary artery calcifications. Hepatobiliary: No solid liver abnormality is seen. Unchanged subcentimeter low-attenuation no gallstones, gallbladder wall thickening, or biliary dilatation. Pancreas: Unchanged post treatment appearance of the pancreatic head (series 15, image 25). Severe ductal dilatation and pancreatic atrophy distal to the pancreatic head, duct again measuring up to 1.1 cm in caliber (series 15, image 21). Spleen: Normal in size without significant abnormality. Adrenals/Urinary Tract: Adrenal glands are unremarkable. Numerous benign bilateral parapelvic renal cysts, for which no further follow-up or characterization is required. Kidneys are otherwise normal, without renal calculi, solid lesion, or hydronephrosis. Bladder is unremarkable. Stomach/Bowel: Thickening of the gastric antrum and pylorus (series 15, image 21). Status post appendectomy. No evidence of bowel wall thickening, distention, or inflammatory changes. Lymphatic: No enlarged abdominal or pelvic lymph nodes. Reproductive: No mass or  other significant abnormality. Other: No abdominal wall hernia or abnormality. No ascites. Musculoskeletal: No acute osseous findings. IMPRESSION: 1. Thickening of the gastric antrum and pylorus, suggestive of gastritis; as on prior examinations this is possibly secondary to radiation therapy. 2. No intraluminal contrast extravasation or other findings to specifically localize reported GI bleeding. 3. Normal contour and caliber of the abdominal aorta. No evidence of aneurysm, dissection, or other acute aortic pathology. Moderate atherosclerosis. 4. Unchanged post treatment appearance of the pancreatic head. Severe ductal dilatation and pancreatic atrophy distal to the pancreatic head. 5. Coronary artery disease. Aortic Atherosclerosis (ICD10-I70.0). Electronically Signed   By: Marolyn JONETTA Jaksch M.D.   On: 01/12/2023 14:43    Labs:  CBC: Recent Labs    02/03/23 1552 02/06/23 1214 02/07/23 0208 02/08/23 0329  WBC 4.1 4.1 3.7* 3.5*  HGB 10.4* 9.2* 8.4* 8.0*  HCT 33.5* 29.5* 26.7* 25.3*  PLT 208 168 147* 133*    COAGS: Recent Labs    12/27/22 1923 01/29/23 1137  INR 1.1 0.9  APTT 58*  --     BMP: Recent Labs    02/02/23 0650 02/06/23 1214 02/07/23 0208 02/08/23 0329  NA 138 140 139 140  K 3.8 3.8 3.6 3.6  CL  106 111 111 112*  CO2 24 21* 20* 23  GLUCOSE 98 155* 114* 136*  BUN 20 24* 17 9  CALCIUM  9.0 9.0 8.5* 8.4*  CREATININE 0.93 0.82 0.74 0.73  GFRNONAA >60 >60 >60 >60    LIVER FUNCTION TESTS: Recent Labs    01/29/23 1137 02/02/23 0650 02/06/23 1214 02/08/23 0329  BILITOT 0.4 0.4 0.4 0.6  AST 47* 33 34 23  ALT 65* 43 40 28  ALKPHOS 110 105 110 89  PROT 6.1* 5.6* 5.8* 5.0*  ALBUMIN  3.2* 2.9* 3.0* 2.5*    TUMOR MARKERS: No results for input(s): AFPTM, CEA, CA199, CHROMGRNA in the last 8760 hours.  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 The procedure was discussed with the patient and her son Lonni (via telephone and again at the bedside) and there is agreement to proceed. We had a lengthy discussion about the indication, risks, benefits, alternatives and procedural expectations. The patient and her son are aware the procedure will be performed under general anesthesia and that it may be a prolonged procedure due to the extensive thrombus. They are also aware that this procedure may not provide a significant benefit to her current clinical state.  Risks and benefits of TIPS, BRTO and/or additional variceal embolization were discussed with the patient and/or the patient's family including, but not limited to, infection, bleeding, damage to adjacent structures, worsening hepatic and/or cardiac function, worsening and/or the development of altered mental status/encephalopathy, non-target embolization and death.   This interventional procedure involves the use of X-rays and because of the nature of the planned procedure, it is possible that we will have prolonged use of X-ray fluoroscopy.  Potential radiation risks to you include (but are not limited to) the following: - A slightly elevated risk for cancer  several years later in life. This risk is typically less than 0.5% percent. This risk is low in comparison to the normal incidence of human cancer, which is 33% for women and 50% for men according to the [employer] Society. - Radiation induced injury can include skin redness, resembling a rash, tissue breakdown / ulcers and hair loss (which can be temporary or permanent).   The likelihood of either of these occurring depends on the difficulty of the procedure and whether you are sensitive to radiation due to previous procedures, disease, or genetic  conditions.   IF your procedure requires a prolonged use of radiation, you will be notified and given written instructions for further action.  It is your responsibility to monitor the irradiated area for the 2 weeks following the procedure and to notify your physician if you are concerned that you have suffered a radiation induced injury.    All of the patient's questions were answered, patient is agreeable to proceed. She will be NPO at midnight. Morning labs ordered and procedure antibiotic ordered. She has not received any anticoagulation since hospital admission.   Consent signed and in chart.  Thank you for this interesting consult.  I greatly enjoyed meeting Sheryl Bender and look forward to participating in their care.  A copy of this report was sent to the requesting provider on this date.  Electronically Signed: Warren Dais, AGACNP-BC 02/08/2023, 8:17 AM   I spent a total of 40 Minutes    in face to face in  clinical consultation, greater than 50% of which was counseling/coordinating care for SMV and portal vein thrombus with recurrent GI bleeding.

## 2023-02-08 NOTE — Evaluation (Signed)
 Physical Therapy Evaluation Patient Details Name: Sheryl Bender MRN: [id number] DOB: Dec 08, 1945 Today's Date: 02/08/2023  History of Present Illness  Pt is 78 year old presented to [facility] on  02/06/23 for recurrence of GI bleed. 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 Underwent EGD and for possible TIPS procedure. PMH - whipple procedure, OA, anxiety, depression, pancreatic adenocarcinoma, PE.  Clinical Impression  Pt presents to PT with slightly unsteady gait due to illness and inactivity. Expect pt will make good progress back to baseline with mobility. Will follow acutely but doubt pt will need PT after DC. If she doesn't progress will reassess for follow up.          If plan is discharge home, recommend the following: Assist for transportation;Assistance with cooking/housework   Can travel by private vehicle        Equipment Recommendations None recommended by PT  Recommendations for Other Services       Functional Status Assessment Patient has had a recent decline in their functional status and demonstrates the ability to make significant improvements in function in a reasonable and predictable amount of time.     Precautions / Restrictions Precautions Precautions: Fall Restrictions Weight Bearing Restrictions Per Provider Order: No      Mobility  Bed Mobility Overal bed mobility: Modified Independent             General bed mobility comments: sit to supine    Transfers Overall transfer level: Needs assistance Equipment used: None Transfers: Sit to/from Stand Sit to Stand: Supervision           General transfer comment: Slightly unsteady. Supervision for safety    Ambulation/Gait Ambulation/Gait assistance: Min assist Gait Distance (Feet): 200 Feet Assistive device: None Gait Pattern/deviations: Step-through pattern, Decreased stride length, Staggering left, Staggering right Gait velocity:  decr Gait velocity interpretation: 1.31 - 2.62 ft/sec, indicative of limited community ambulator   General Gait Details: Slight unsteadiness/staggering with min assist to correct  Stairs            Wheelchair Mobility     Tilt Bed    Modified Rankin (Stroke Patients Only)       Balance Overall balance assessment: Needs assistance Sitting-balance support: No upper extremity supported, Feet supported Sitting balance-Leahy Scale: Normal     Standing balance support: No upper extremity supported, During functional activity Standing balance-Leahy Scale: Fair                               Pertinent Vitals/Pain Pain Assessment Pain Assessment: Faces Faces Pain Scale: Hurts little more Pain Location: back Pain Descriptors / Indicators: Grimacing, Guarding Pain Intervention(s): Monitored during session, Repositioned    Home Living Family/patient expects to be discharged to:: Private residence Living Arrangements: Alone Available Help at Discharge: Family;Friend(s) Type of Home: Apartment Home Access: Stairs to enter Entrance Stairs-Rails: None Entrance Stairs-Number of Steps: 1   Home Layout: One level Home Equipment: Agricultural Consultant (2 wheels);Shower seat      Prior Function Prior Level of Function : Independent/Modified Independent             Mobility Comments: Uses walker only very occasionally ADLs Comments: Independent with all ADL's     Extremity/Trunk Assessment   Upper Extremity Assessment Upper Extremity Assessment: Generalized weakness    Lower Extremity Assessment Lower Extremity Assessment: Generalized weakness       Communication  Communication Communication: No apparent difficulties  Cognition Arousal: Alert Behavior During Therapy: WFL for tasks assessed/performed Overall Cognitive Status: Within Functional Limits for tasks assessed                                          General Comments       Exercises     Assessment/Plan    PT Assessment Patient needs continued PT services  PT Problem List Decreased strength;Decreased activity tolerance;Decreased balance;Decreased mobility       PT Treatment Interventions DME instruction;Gait training;Functional mobility training;Therapeutic activities;Therapeutic exercise;Balance training;Patient/family education    PT Goals (Current goals can be found in the Care Plan section)  Acute Rehab PT Goals Patient Stated Goal: get stronger PT Goal Formulation: With patient Time For Goal Achievement: 02/22/23 Potential to Achieve Goals: Good    Frequency Min 1X/week     Co-evaluation               AM-PAC PT 6 Clicks Mobility  Outcome Measure Help needed turning from your back to your side while in a flat bed without using bedrails?: None Help needed moving from lying on your back to sitting on the side of a flat bed without using bedrails?: None Help needed moving to and from a bed to a chair (including a wheelchair)?: A Little Help needed standing up from a chair using your arms (e.g., wheelchair or bedside chair)?: A Little Help needed to walk in hospital room?: A Little Help needed climbing 3-5 steps with a railing? : A Little 6 Click Score: 20    End of Session   Activity Tolerance: Patient tolerated treatment well Patient left: in chair;with call bell/phone within reach;with family/visitor present Nurse Communication: Mobility status PT Visit Diagnosis: Unsteadiness on feet (R26.81);Muscle weakness (generalized) (M62.81)    Time: 8779-8765 PT Time Calculation (min) (ACUTE ONLY): 14 min   Charges:   PT Evaluation $PT Eval Low Complexity: 1 Low   PT General Charges $$ ACUTE PT VISIT: 1 Visit          [facility] PT Acute Rehabilitation Services Office [phone]   Rodgers ORN [facility] 02/08/2023, 12:49 PM

## 2023-02-08 NOTE — Progress Notes (Signed)
 PROGRESS NOTE                                                                                                                                                                                                             Patient Demographics:    Sheryl Bender, is a 78 y.o. female, DOB - [birth year], FMW:[id number]  Outpatient Primary MD for the patient is Lonna Millman, DO    LOS - 2  Admit date - 02/06/2023    Chief Complaint  Patient presents with   Abdominal Pain   Weakness       Brief Narrative (HPI from H&P)   78 y.o. female with medical history significant of Stage 1 pancreatic cancer s/p chemoradiation, pulmonary embolism on Eliquis , iron-deficiency anemia, interstitial pancreatitis, HTN, recurrent GI bleed who presents with melena and weakness.    Patient hospitalized from 12/2-12/5 with GI bleed requiring transfusion. 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  Colonoscopy with no active bleeding, minimal amount of old blood in the cecum, 6 mm polyp removed from mid sigmoid colon.    She then resumed Eliquis  on 12/11 and was re-admitted from 12/18-12/21 with recurrent GI bleed requiring transfusion. EGD found to have hemorrhagic gastritis, treated with APC, bipolar cautery.  GAVE is also possibility.  GI recommended PPI twice daily for 4 weeks then daily indefinitely. Asked to resume Eliquis  on 12/28.   Hospitalized again 1/4 -1/8 for GI bleed. EGD Gastritis with hemorrhage. Treated with argon plasma coagulation (APC). Non-bleeding gastric ulcers with no stigmata of bleeding. Duodenitis. CT abdomen and pelvis with contrast-question of the nonocclusive thrombus in the SMV but most significant thrombus not seen in the portal vein including the main portal vein. low-density masslike area along the uncinate process with associated severe ductal dilatation in  caliber change. Plan was for IR thrombectomy on 1/21 and to hold any systemic anticoagulation.   She now presented to the ER on 02/06/2023 for reoccurrence of GI bleed.   Subjective:    Sheryl Bender today has, No headache, No chest pain, No abdominal pain - No Nausea, No new weakness tingling or numbness, no shortness of breath.   Assessment  & Plan :    Recurrent upper GI bleed fourth episode in the last 4 weeks, bleeding likely being caused  by.  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  Seen by GI underwent EGD on 02/07/2023 by Dr. Federico, found bleeding GAVE in the antrum treated with APC, also several Forrest class III ulcers in gastric antrum.  On PPI, Carafate , liquid diet, GI on board.  IR also considering TIPS procedure to decompress portal pressure.  Need to monitor CBC and have long-term goals of care.  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 CT abdomen shows progressive thrombus in SMV into portal vein.  ?clot vs tumor.  IR planning TIPS procedure may also require thrombectomy at that time, have involved oncology and palliative care along with GI this admission to provide long-term prognosis and goals of care.  Patient's pancreatic cancer was being treated at [facility] by  Dr. Katragadda, was currently being monitored off of chemotherapy for the last 3 months, she also went to [facility] for a Whipple's procedure but it could not be done due to the location of her tumor.  Unfortunately CA 19.9 is now quite high.  Malignant neoplasm of pancreas (HCC) CA19-9 has increased from normal 4 months ago to 220 this week.  Kindly see above  Essential hypertension BP normal, diet controlled  Depression - Continue bupropion , Buspar , Lexapro  and mirtazapine        Condition - Extremely  Guarded  Family Communication  : Discussed with patient's son Sheryl Bender [phone]  on 02/08/2023 at 10:15 AM  Code Status : Full code  Consults  : GI, IR, oncology, palliative care  PUD Prophylaxis : PPI   Procedures  :     CT - 1. Progressive thrombus within the superior mesenteric vein over the last 8 days, now occlusive. Progressive thrombus within the main and intrahepatic portal veins. 2. Known hypodense pancreatic mass is grossly unchanged measuring 2.2 cm. This is less well-defined than on prior exam. 3. Peripancreatic fat stranding is mild. 4. Persistent and progressive wall thickening about the distal stomach, duodenum, and hepatic flexure of the colon. Progressive right upper quadrant fat stranding. Etiology of bowel wall thickening and inflammation is indeterminate. 5. Increasing portosystemic collaterals. Paraesophageal varices. Aortic Atherosclerosis (ICD10-I70.0)      Disposition Plan  :    Status is: Inpatient   DVT Prophylaxis  :    SCDs Start: 02/06/23 1929   Lab Results  Component Value Date   PLT 133 (L) 02/08/2023    Diet :  Diet Order             Diet clear liquid Fluid consistency: Thin  Diet effective now                    Inpatient Medications  Scheduled Meds:  buPROPion   300 mg Oral QPC breakfast   busPIRone   20 mg Oral BID   Chlorhexidine  Gluconate Cloth  6 each Topical Daily   cholecalciferol   5,000 Units Oral q morning   escitalopram   20 mg Oral QHS   linaclotide   72 mcg Oral QAC breakfast   mirtazapine   15 mg Oral QHS   pantoprazole  (PROTONIX ) IV  40 mg Intravenous Q12H   QUEtiapine   25 mg Oral BID   sodium chloride  flush  10-40 mL Intracatheter Q12H   sucralfate   1 g Oral TID WC & HS   Continuous Infusions:  dextrose  5 % with KCl 20 mEq /  L 75 mL/hr at 02/08/23 0231   PRN Meds:.sodium chloride   Antibiotics  :    Anti-infectives (From admission, onward)    None         Objective:   Vitals:   02/07/23 1602  02/07/23 2021 02/08/23 0000 02/08/23 0400  BP: 124/66 139/67 105/68 (!) 104/58  Pulse: 91 88 82 78  Resp: 20 19 20  (!) 22  Temp:  98.6 F (37 C) 98.3 F (36.8 C) 98.2 F (36.8 C)  TempSrc:  Oral  Oral  SpO2: 97% 97% 96% 100%  Weight:      Height:        Wt Readings from Last 3 Encounters:  02/06/23 58.6 kg  02/03/23 58.6 kg  01/29/23 59.9 kg     Intake/Output Summary (Last 24 hours) at 02/08/2023 1010 Last data filed at 02/08/2023 0500 Gross per 24 hour  Intake 1683.99 ml  Output 350 ml  Net 1333.99 ml     Physical Exam  Awake Alert, No new F.N deficits, Normal affect [place].AT,PERRAL Supple Neck, No JVD,   Symmetrical Chest wall movement, Good air movement bilaterally, CTAB RRR,No Gallops,Rubs or new Murmurs,  +ve B.Sounds, Abd Soft, No tenderness,   No Cyanosis, Clubbing or edema     RN pressure injury documentation:      Data Review:    Recent Labs  Lab 02/02/23 0650 02/03/23 1552 02/06/23 1214 02/07/23 0208 02/08/23 0329  WBC 3.2* 4.1 4.1 3.7* 3.5*  HGB 10.3* 10.4* 9.2* 8.4* 8.0*  HCT 31.1* 33.5* 29.5* 26.7* 25.3*  PLT 168 208 168 147* 133*  MCV 92.0 95 95.5 95.7 93.7  MCH 30.5 29.5 29.8 30.1 29.6  MCHC 33.1 31.0* 31.2 31.5 31.6  RDW 15.9* 15.2 16.5* 16.4* 16.2*  LYMPHSABS 0.7 0.9  --   --   --   MONOABS 0.5  --   --   --   --   EOSABS 0.0 0.0  --   --   --   BASOSABS 0.0 0.0  --   --   --     Recent Labs  Lab 02/02/23 0650 02/06/23 1214 02/07/23 0208 02/08/23 0329  NA 138 140 139 140  K 3.8 3.8 3.6 3.6  CL 106 111 111 112*  CO2 24 21* 20* 23  ANIONGAP 8 8 8 5   GLUCOSE 98 155* 114* 136*  BUN 20 24* 17 9  CREATININE 0.93 0.82 0.74 0.73  AST 33 34  --  23  ALT 43 40  --  28  ALKPHOS 105 110  --  89  BILITOT 0.4 0.4  --  0.6  ALBUMIN  2.9* 3.0*  --  2.5*  CALCIUM  9.0 9.0 8.5* 8.4*      Recent Labs  Lab 02/02/23 0650 02/06/23 1214 02/07/23 0208 02/08/23 0329  CALCIUM  9.0 9.0 8.5* 8.4*     --------------------------------------------------------------------------------------------------------------- No results found for: CHOL, HDL, LDLCALC, LDLDIRECT, TRIG, CHOLHDL  No results found for: HGBA1C No results for input(s): TSH, T4TOTAL, FREET4, T3FREE, THYROIDAB in the last 72 hours. No results for input(s): VITAMINB12, FOLATE, FERRITIN, TIBC, IRON, RETICCTPCT in the last 72 hours. ------------------------------------------------------------------------------------------------------------------ Cardiac Enzymes No results for input(s): CKMB, TROPONINI, MYOGLOBIN in the last 168 hours.  Invalid input(s): CK  Micro Results No results found for this or any previous visit (from the past 240 hours).  Radiology Reports CT ABDOMEN PELVIS W CONTRAST Result Date: 02/06/2023 CLINICAL DATA:  Pancreatitis, acute, severe Patient presents with abdominal pain. Radiologic records indicates history of  pancreatic cancer. EXAM: CT ABDOMEN AND PELVIS WITH CONTRAST TECHNIQUE: Multidetector CT imaging of the abdomen and pelvis was performed using the standard protocol following bolus administration of intravenous contrast. RADIATION DOSE REDUCTION: This exam was performed according to the departmental dose-optimization program which includes automated exposure control, adjustment of the mA and/or kV according to patient size and/or use of iterative reconstruction technique. CONTRAST:  75mL OMNIPAQUE  IOHEXOL  350 MG/ML SOLN COMPARISON:  CT 8 days ago.  PET CT 10/28/2022 reviewed FINDINGS: Lower chest: Again seen linear atelectasis or scarring in the left greater than right lower lobe. 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 No pleural effusion. Hepatobiliary: Tiny hypodensity in the right lobe of the liver is unchanged series 3, image 9. No new intrahepatic abnormality. There is progressive portal vein thrombus, increasing volume of intrahepatic portal  vein thrombus. The gallbladder is not well seen. Common bile duct is poorly defined on the current exam. Pancreas: Pancreatic atrophy with ductal dilatation, unchanged over the last 8 days. The known hypodense pancreatic mass is grossly unchanged measuring 2.2 cm series 3, image 23. This is less well-defined than on prior exam. Lesion abuts the SMV with progressive SMV thrombus. Peripancreatic fat stranding about the pancreatic head is minor. Spleen: Bilobed appearance of the spleen. No focal splenic abnormality. Adrenals/Urinary Tract: No adrenal nodule. Bilateral parapelvic cysts. No further follow-up imaging is recommended. No hydronephrosis or renal inflammation. Moderate bladder distension, no wall thickening. Stomach/Bowel: Detailed bowel assessment is limited in the absence of enteric contrast. Paraesophageal varices. Wall thickening about the distal stomach, reference series 3, image 21. Again seen wall thickening of the duodenum with adjacent stranding, for example series 3, image 25. No small bowel obstruction or additional small bowel wall thickening. No small bowel pneumatosis. There is wall thickening about the hepatic flexure of the colon series 3, image 26, increased from prior exam. Transverse colon is redundant. Small to moderate colonic stool burden. Vascular/Lymphatic: Progressive thrombus within the superior mesenteric vein which is now occlusive. Progressive thrombus within the main and intrahepatic portal veins. The splenic vein remains patent. Increasing portosystemic collaterals. Aortic atherosclerosis. No bulky adenopathy. Reproductive: Nonacute. Other: Right upper quadrant fat stranding which is adjacent to gastric, duodenal and colonic wall thickening. No frank ascites. No free air. Right lateral lumbar hernia contains only fat. Musculoskeletal: Posterior rod with intrapedicular screw fusion L2 through L5. Additional multilevel degenerative change in the spine. No evidence of focal bone  lesion. IMPRESSION: 1. Progressive thrombus within the superior mesenteric vein over the last 8 days, now occlusive. Progressive thrombus within the main and intrahepatic portal veins. 2. Known hypodense pancreatic mass is grossly unchanged measuring 2.2 cm. This is less well-defined than on prior exam. 3. Peripancreatic fat stranding is mild. 4. Persistent and progressive wall thickening about the distal stomach, duodenum, and hepatic flexure of the colon. Progressive right upper quadrant fat stranding. Etiology of bowel wall thickening and inflammation is indeterminate. 5. Increasing portosystemic collaterals. Paraesophageal varices. Aortic Atherosclerosis (ICD10-I70.0). Electronically Signed   By: Melda Gasman M.D.   On: 02/06/2023 17:21      Signature  -   Lavada Stank M.D on 02/08/2023 at 10:10 AM   -  To page go to [email or web address]

## 2023-02-08 NOTE — Consult Note (Signed)
 Palliative Care Consult Note                                  Date: 02/08/2023   Patient Name: Sheryl Bender  DOB: Jun 25, 1945  MRN: [id number]  Age / Sex: 78 y.o., female  PCP: Lonna Millman, DO Referring Physician: Dennise Lavada POUR, MD  Reason for Consultation: Establishing goals of care  HPI/Patient Profile: 78 y.o. female  with past medical history of Stage 1 pancreatic cancer s/p chemoradiation, pulmonary embolism on Eliquis , iron-deficiency anemia, interstitial pancreatitis, HTN, recurrent GI bleed who presents with melena and weakness.  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   Palliative medicine was consulted for GOC conversations.  Past Medical History:  Diagnosis Date   Anxiety    Arthritis    Depression    Dysrhythmia    hx palpitations greater than 5 yrs -neg echo, stress per pt ? where or dr   GERD (gastroesophageal reflux disease)    occ   Nausea & vomiting 08/24/2021   Pancreatic adenocarcinoma (HCC)    Pancreatic pseudocyst    Port-A-Cath in place 09/15/2021   Pulmonary embolism [facility])    September 2022    Subjective:   This NP Camellia Kays reviewed medical records, received report from team, assessed the patient and then meet at the patient's bedside to discuss diagnosis, prognosis, GOC, EOL wishes disposition and options.  I met with the patient at the bedside.  Initially she seemed quite weak and requested I come back when her son is present to help her.  I returned later in the day to speak with the patient and her son Sheryl Bender.   We meet to discuss diagnosis prognosis, GOC, EOL wishes, disposition and options. Concept of Palliative Care was introduced as specialized medical care for people and their families  living with serious illness.  If focuses on providing relief from the symptoms and stress of a serious illness.  The goal is to improve quality of life for both the patient and the family. Values and goals of care important to patient and family were attempted to be elicited.  Created space and opportunity for patient  and family to explore thoughts and feelings regarding current medical situation   Natural trajectory and current clinical status were discussed. Questions and concerns addressed. Patient  encouraged to call with questions or concerns.    Patient/Family Understanding of Illness: They understand she has pancreatic cancer, sees Dr. POUR in [place], but also seeing [facility].  She tolerated chemotherapy and radiation okay.  I understand her tumor does not appear to have grown on imaging, however she now has an apparent clot that has gotten progressively worse.  They had previously agreed to a TIPS procedure on the 21st of this month, but now they understand it would likely need to be this admission due to the growth of the clot.  There is also a possibility that the clot could be tumor, although they shared that IR feels it is likely clotted and they will attempt a thrombectomy during the procedure.  They understand, even if improvement in the portal hypertension after TIPS and thrombectomy, she is facing a very serious situation with her pancreatic cancer and she is likely not a candidate for further treatment  if the cancer spreads.  Life Review: The patient has 2 sons.  She was a widow and her husband passed in 2008.  Goals: Maximize quality of life, have TIPS procedure to attempt some sort of improvement, hope to discharge from the hospital  Today's Discussion: In addition to discussions described above we had substantial discussion on various topics.  I shared my review of the chart indicating that she has a low performance status and likely not a candidate for ongoing chemotherapy or  radiation.  The planned tips/thrombectomy procedure also somewhat of a high risk, but that is a risk that they are taking.  She states that she did not have a good night last night and she is very tired today.  We discussed home resources including visiting [facility].  We discussed outpatient palliative care given the overall picture of pancreatic cancer, potentially not amenable to treatment depending on her performance status.  They are in agreement with outpatient palliative care referral and her son feels that this would be quite helpful.  We talked about possible treatment options, including her procedure.  We shared that while she may not be a candidate for treatment of her cancer this could change depending on her performance status.  She states that she would be open to all available and offered treatments.  She also understands that the Lynch pain and all of her potential treatment options as her performance status and weakness.  Given the severity of her illnesses we discussed CODE STATUS.  After a substantial discussion they are agreeable to DNR-limited.  However, they understand that during procedure and surgeries that she would be reverted to a full code, and then back to DNR status on the floor.  We also discussed additional goals.  She states that she would not want to be artificially prolonged, including no feeding tubes.  We also discussed who would be her primary decision-maker if she could not make her decisions for herself.  She states that she does have a living will and healthcare power of attorney naming her son Sheryl Bender as [place].  Sheryl Bender offers to bring these documents and so we can scan them into the system.  I shared that given she is likely going to have an IR procedure with sedation tomorrow and I am off the day after, that I would return on Friday.  However, I provided a contact card with contact information for the palliative medicine team.  If we are needed on Thursday  please feel free to reach out to us  and somebody would be happy to assist.  I provided emotional and general support through therapeutic listening, empathy, sharing of stories, therapeutic touch, and other techniques. I answered all questions and addressed all concerns to the best of my ability.  Review of Systems  Constitutional:  Positive for fatigue.  Respiratory:  Negative for shortness of breath.   Cardiovascular:  Negative for chest pain.  Gastrointestinal:  Negative for abdominal pain, nausea and vomiting.  Neurological:  Positive for weakness.    Objective:   Primary Diagnoses: Present on Admission:  GI bleed  Superior mesenteric vein thrombosis (HCC)  Depression  Acute blood loss anemia  Malignant neoplasm of pancreas [facility])  Essential hypertension   Physical Exam Vitals and nursing note reviewed.  Constitutional:      General: She is not in acute distress.    Appearance: She is ill-appearing.     Comments: Appears very weak and frail  HENT:     Head: Normocephalic and  atraumatic.  Cardiovascular:     Rate and Rhythm: Normal rate.  Pulmonary:     Effort: Pulmonary effort is normal. No respiratory distress.  Abdominal:     General: Abdomen is flat.  Skin:    General: Skin is warm and dry.  Neurological:     General: No focal deficit present.     Mental Status: She is alert.  Psychiatric:        Mood and Affect: Mood normal.        Behavior: Behavior normal.     Vital Signs:  BP (!) 104/58 (BP Location: Left Arm)   Pulse 78   Temp 98.2 F (36.8 C) (Oral)   Resp (!) 22   Ht 5' 2 (1.575 m)   Wt 58.6 kg   SpO2 100%   BMI 23.63 kg/m   Palliative Assessment/Data: 40-50%    Advanced Care Planning:   Existing Vynca/ACP Documentation: None  Primary Decision Maker: PATIENT  Code Status/Advance Care Planning: Full code changed to DNR-limited  A discussion was had today regarding advanced directives. Concepts specific to code status, artifical  feeding and hydration, continued IV antibiotics and rehospitalization was had.  The difference between a aggressive medical intervention path and a palliative comfort care path for this patient at this time was had.   Decisions/Changes to ACP: Changed CODE STATUS to DNR-limited  Assessment & Plan:   Impression: 78 year old female with acute presentation chronic comorbidities as described above.  After much discussion with family the patient has elected to undergo the TIPS/thrombectomy procedure tomorrow with interventional radiology.  They do understand and grant her picture that, given her weakness and poor performance status, at this time she is not a candidate for ongoing cancer treatments.  However, if her performance status improves that this could change.  She does actively see [facility] for cancer treatment.  She also sees Dr. MARLA locally.  The patient states that she is open to all available and offered treatments.  After discussion of goals of care they have elected DNR (except when in procedure), no feeding tube.  Son Sheryl Bender is her HCPOA and fully understands her wishes.  Overall prognosis guarded to poor.  SUMMARY OF RECOMMENDATIONS   Changed to DNR-limited Anticipate full code and procedure/surgery, revert back to DNR on the floor/in general No feeding tubes Son Sheryl Bender is HCPOA, will bring in documentation [facility] consult for outpatient palliative care referral at discharge Palliative medicine will follow-up on Friday 02/11/23 Feel free to call us  if we are needed before then  Symptom Management:  Per primary team PMT is available to assist as needed  Prognosis:  Unable to determine  Discharge Planning:  To Be Determined   Discussed with: Patient, family, medical team, nursing team, [facility] team    Thank you for allowing us  to participate in the care of Reve Crocket PMT will continue to support holistically.  Time Total: 75 min  Detailed review of medical  records (labs, imaging, vital signs), medically appropriate exam, discussed with treatment team, counseling and education to patient, family, & staff, documenting clinical information, medication management, coordination of care  Signed by: Camellia Kays, NP Palliative Medicine Team  Team Phone # [phone] (Nights/Weekends)  02/08/2023, 10:43 AM \

## 2023-02-08 NOTE — Progress Notes (Addendum)
 Sheryl Bender   DOB:08-12-1945   FM#:[id number]      ASSESSMENT & PLAN:  1.  Pancreatic adenocarcinoma (T1 N0 M0), stage I - Cytology 07/09/2021 confirmed adenocarcinoma. - PET scan and MRI was done July 2023, showed no evidence of abdominal metastatic disease. - Patient was initiated with FOLFOX 08/27/2021 and cycle 2 was given FOLFIRINOX on 09/30/2021.  Patient was scheduled to have a Whipple which was aborted due to extensive scarring.  Xeloda  initiated 04/05/2022.    - Now with rising CA 19.9 noted - No inpatient chemotherapy planned  - Follows with medical oncology/Dr. Rogers.  Dr. Lanny will cover during this hospitalization.  2.  History of pulmonary embolism - Diagnosed January 2023.  She was treated with anticoagulant Eliquis  x 60 days.  3.  Anemia - Hemoglobin low 8.0 - Likely multifactorial due to GI bleed, malignancy, iron deficiency, chronic disease - Upper GI done 02/07/2023 showed active bleeding of gastric antral vascular ectasia. - Colonoscopy also done with no active bleeding noted. - Transfuse PRBC for Hgb <7.0 - No indication to transfuse today - Monitor CBC with differential closely  4.  Thrombocytopenia - Mild - Platelets low 133K - No transfusional requirements at this time - Monitor closely for bleeding  Subjective:  Patient seen awake and alert sitting up in chair at bedside.  Reports ongoing shortness of breath with exertion and fatigue.  Denies new acute complaints.  No acute distress is noted.  Patient is very pleasant.     Objective:  Vitals:   02/08/23 0000 02/08/23 0400  BP: 105/68 (!) 104/58  Pulse: 82 78  Resp: 20 (!) 22  Temp: 98.3 F (36.8 C) 98.2 F (36.8 C)  SpO2: 96% 100%     Intake/Output Summary (Last 24 hours) at 02/08/2023 1202 Last data filed at 02/08/2023 0500 Gross per 24 hour  Intake 1683.99 ml  Output 350 ml  Net 1333.99 ml     REVIEW OF SYSTEMS:   Constitutional: +Fatigue, denies fevers, chills or abnormal night  sweats Eyes: Denies blurriness of vision, double vision or watery eyes Ears, nose, mouth, throat, and face: Denies mucositis or sore throat Respiratory: Denies cough, dyspnea or wheezes Cardiovascular: Denies palpitation, chest discomfort or lower extremity swelling Gastrointestinal:  Denies nausea, heartburn or change in bowel habits Skin: Denies abnormal skin rashes Lymphatics: Denies new lymphadenopathy or easy bruising Neurological: Denies numbness, tingling or new weaknesses Behavioral/Psych: Mood is stable, no new changes  All other systems were reviewed with the patient and are negative.  PHYSICAL EXAMINATION: ECOG PERFORMANCE STATUS: 3 - Symptomatic, >50% confined to bed  Vitals:   02/08/23 0000 02/08/23 0400  BP: 105/68 (!) 104/58  Pulse: 82 78  Resp: 20 (!) 22  Temp: 98.3 F (36.8 C) 98.2 F (36.8 C)  SpO2: 96% 100%   Filed Weights   02/06/23 1210  Weight: 129 lb 3.2 oz (58.6 kg)    GENERAL: +Ill-appearing, alert, no distress and comfortable SKIN: +Pale skin color, texture, turgor are normal, no rashes or significant lesions EYES: normal, conjunctiva are pink and non-injected, sclera clear OROPHARYNX: no exudate, no erythema and lips, buccal mucosa, and tongue normal  NECK: supple, thyroid normal size, +tender, without nodularity LYMPH: no palpable lymphadenopathy in the cervical, axillary or inguinal LUNGS: clear to auscultation and percussion with normal breathing effort HEART: regular rate & rhythm and no murmurs and no lower extremity edema ABDOMEN: abdomen soft, non-tender and normal bowel sounds MUSCULOSKELETAL: no cyanosis of digits and no clubbing  PSYCH: alert & oriented x 3 with fluent speech NEURO: no focal motor/sensory deficits   All questions were answered. The patient knows to call the clinic with any problems, questions or concerns.   The total time spent in the appointment was 55 minutes encounter with patient including review of chart and  various tests results, discussions about plan of care and coordination of care plan  Olam JINNY Brunner, NP 02/08/2023 12:02 PM    Labs Reviewed:  Lab Results  Component Value Date   WBC 3.5 (L) 02/08/2023   HGB 8.0 (L) 02/08/2023   HCT 25.3 (L) 02/08/2023   MCV 93.7 02/08/2023   PLT 133 (L) 02/08/2023   Recent Labs    02/02/23 0650 02/06/23 1214 02/07/23 0208 02/08/23 0329  NA 138 140 139 140  K 3.8 3.8 3.6 3.6  CL 106 111 111 112*  CO2 24 21* 20* 23  GLUCOSE 98 155* 114* 136*  BUN 20 24* 17 9  CREATININE 0.93 0.82 0.74 0.73  CALCIUM  9.0 9.0 8.5* 8.4*  GFRNONAA >60 >60 >60 >60  PROT 5.6* 5.8*  --  5.0*  ALBUMIN  2.9* 3.0*  --  2.5*  AST 33 34  --  23  ALT 43 40  --  28  ALKPHOS 105 110  --  89  BILITOT 0.4 0.4  --  0.6    Studies Reviewed:  CT ABDOMEN PELVIS W CONTRAST Result Date: 02/06/2023 CLINICAL DATA:  Pancreatitis, acute, severe Patient presents with abdominal pain. Radiologic records indicates history of pancreatic cancer. EXAM: CT ABDOMEN AND PELVIS WITH CONTRAST TECHNIQUE: Multidetector CT imaging of the abdomen and pelvis was performed using the standard protocol following bolus administration of intravenous contrast. RADIATION DOSE REDUCTION: This exam was performed according to the departmental dose-optimization program which includes automated exposure control, adjustment of the mA and/or kV according to patient size and/or use of iterative reconstruction technique. CONTRAST:  75mL OMNIPAQUE  IOHEXOL  350 MG/ML SOLN COMPARISON:  CT 8 days ago.  PET CT 10/28/2022 reviewed FINDINGS: Lower chest: Again seen linear atelectasis or scarring in the left greater than right lower lobe. 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 No pleural effusion. Hepatobiliary: Tiny hypodensity in the right lobe of the liver is unchanged series 3, image 9. No new intrahepatic abnormality. There is progressive portal vein thrombus, increasing volume of intrahepatic portal vein thrombus.  The gallbladder is not well seen. Common bile duct is poorly defined on the current exam. Pancreas: Pancreatic atrophy with ductal dilatation, unchanged over the last 8 days. The known hypodense pancreatic mass is grossly unchanged measuring 2.2 cm series 3, image 23. This is less well-defined than on prior exam. Lesion abuts the SMV with progressive SMV thrombus. Peripancreatic fat stranding about the pancreatic head is minor. Spleen: Bilobed appearance of the spleen. No focal splenic abnormality. Adrenals/Urinary Tract: No adrenal nodule. Bilateral parapelvic cysts. No further follow-up imaging is recommended. No hydronephrosis or renal inflammation. Moderate bladder distension, no wall thickening. Stomach/Bowel: Detailed bowel assessment is limited in the absence of enteric contrast. Paraesophageal varices. Wall thickening about the distal stomach, reference series 3, image 21. Again seen wall thickening of the duodenum with adjacent stranding, for example series 3, image 25. No small bowel obstruction or additional small bowel wall thickening. No small bowel pneumatosis. There is wall thickening about the hepatic flexure of the colon series 3, image 26, increased from prior exam. Transverse colon is redundant. Small to moderate colonic stool burden. Vascular/Lymphatic: Progressive thrombus within the  superior mesenteric vein which is now occlusive. Progressive thrombus within the main and intrahepatic portal veins. The splenic vein remains patent. Increasing portosystemic collaterals. Aortic atherosclerosis. No bulky adenopathy. Reproductive: Nonacute. Other: Right upper quadrant fat stranding which is adjacent to gastric, duodenal and colonic wall thickening. No frank ascites. No free air. Right lateral lumbar hernia contains only fat. Musculoskeletal: Posterior rod with intrapedicular screw fusion L2 through L5. Additional multilevel degenerative change in the spine. No evidence of focal bone lesion.  IMPRESSION: 1. Progressive thrombus within the superior mesenteric vein over the last 8 days, now occlusive. Progressive thrombus within the main and intrahepatic portal veins. 2. Known hypodense pancreatic mass is grossly unchanged measuring 2.2 cm. This is less well-defined than on prior exam. 3. Peripancreatic fat stranding is mild. 4. Persistent and progressive wall thickening about the distal stomach, duodenum, and hepatic flexure of the colon. Progressive right upper quadrant fat stranding. Etiology of bowel wall thickening and inflammation is indeterminate. 5. Increasing portosystemic collaterals. Paraesophageal varices. Aortic Atherosclerosis (ICD10-I70.0). Electronically Signed   By: Ivianna Gasman M.D.   On: 02/06/2023 17:21   ECHOCARDIOGRAM COMPLETE Result Date: 02/01/2023    ECHOCARDIOGRAM REPORT   Patient Name:   Sheryl Bender Date of Exam: 02/01/2023 Medical Rec #:  [id number]         Height:       62.5 in Accession #:    [id number]        Weight:       132.0 lb Date of Birth:  December 21, 1945        BSA:          1.612 m Patient Age:    77 years          BP:           133/76 mmHg Patient Gender: F                 HR:           73 bpm. Exam Location:  Inpatient Procedure: 2D Echo, Color Doppler and Cardiac Doppler Indications:    Pre-op exam (TIPS)  History:        Patient has prior history of Echocardiogram examinations, most                 recent 03/18/2014. Risk Factors:Hypertension.  Sonographer:    Damien Senior RDCS Referring Phys: [id number] TOYA DEL HAN IMPRESSIONS  1. Left ventricular ejection fraction, by estimation, is 55 to 60%. The left ventricle has normal function. The left ventricle has no regional wall motion abnormalities. Left ventricular diastolic parameters are consistent with Grade I diastolic dysfunction (impaired relaxation).  2. Right ventricular systolic function is normal. The right ventricular size is normal. Tricuspid regurgitation signal is inadequate for assessing PA  pressure.  3. A small pericardial effusion is present. The pericardial effusion is anterior to the right ventricle. There is no evidence of cardiac tamponade.  4. The mitral valve is normal in structure. Trivial mitral valve regurgitation. No evidence of mitral stenosis.  5. The aortic valve is tricuspid. There is mild calcification of the aortic valve. Aortic valve regurgitation is trivial. Aortic valve sclerosis is present, with no evidence of aortic valve stenosis.  6. The inferior vena cava is normal in size with greater than 50% respiratory variability, suggesting right atrial pressure of 3 mmHg. FINDINGS  Left Ventricle: Left ventricular ejection fraction, by estimation, is 55 to 60%. The left ventricle has normal function. The left ventricle has no  regional wall motion abnormalities. The left ventricular internal cavity size was normal in size. There is  no left ventricular hypertrophy. Left ventricular diastolic parameters are consistent with Grade I diastolic dysfunction (impaired relaxation). Right Ventricle: The right ventricular size is normal. No increase in right ventricular wall thickness. Right ventricular systolic function is normal. Tricuspid regurgitation signal is inadequate for assessing PA pressure. Left Atrium: Left atrial size was normal in size. Right Atrium: Right atrial size was normal in size. Pericardium: A small pericardial effusion is present. The pericardial effusion is anterior to the right ventricle. There is no evidence of cardiac tamponade. Presence of epicardial fat layer. Mitral Valve: The mitral valve is normal in structure. Trivial mitral valve regurgitation. No evidence of mitral valve stenosis. Tricuspid Valve: The tricuspid valve is normal in structure. Tricuspid valve regurgitation is trivial. No evidence of tricuspid stenosis. Aortic Valve: The aortic valve is tricuspid. There is mild calcification of the aortic valve. Aortic valve regurgitation is trivial. Aortic valve  sclerosis is present, with no evidence of aortic valve stenosis. Pulmonic Valve: The pulmonic valve was normal in structure. Pulmonic valve regurgitation is trivial. No evidence of pulmonic stenosis. Aorta: The aortic root and ascending aorta are structurally normal, with no evidence of dilitation. Venous: The inferior vena cava is normal in size with greater than 50% respiratory variability, suggesting right atrial pressure of 3 mmHg. IAS/Shunts: The atrial septum is grossly normal.  LEFT VENTRICLE PLAX 2D LVIDd:         4.10 cm   Diastology LVIDs:         2.90 cm   LV e' medial:    4.68 cm/s LV PW:         0.80 cm   LV E/e' medial:  12.0 LV IVS:        0.70 cm   LV e' lateral:   9.46 cm/s LVOT diam:     2.00 cm   LV E/e' lateral: 5.9 LV SV:         47 LV SV Index:   29 LVOT Area:     3.14 cm  RIGHT VENTRICLE RV S prime:     12.80 cm/s TAPSE (M-mode): 1.9 cm LEFT ATRIUM             Index        RIGHT ATRIUM           Index LA diam:        2.60 cm 1.61 cm/m   RA Area:     11.70 cm LA Vol (A2C):   33.8 ml 20.97 ml/m  RA Volume:   24.50 ml  15.20 ml/m LA Vol (A4C):   35.7 ml 22.15 ml/m LA Biplane Vol: 36.7 ml 22.77 ml/m  AORTIC VALVE LVOT Vmax:   69.80 cm/s LVOT Vmean:  52.400 cm/s LVOT VTI:    0.151 m  AORTA Ao Root diam: 3.10 cm Ao Asc diam:  3.20 cm MITRAL VALVE MV Area (PHT): 2.27 cm    SHUNTS MV E velocity: 56.10 cm/s  Systemic VTI:  0.15 m MV A velocity: 79.70 cm/s  Systemic Diam: 2.00 cm MV E/A ratio:  0.70 Darryle Decent MD Electronically signed by Darryle Decent MD Signature Date/Time: 02/01/2023/10:35:33 AM    Final    CT ABDOMEN PELVIS W CONTRAST Result Date: 01/29/2023 CLINICAL DATA:  Dark red rectal bleeding since last Thursday. Pancreatic cancer. * Tracking Code: BO * EXAM: CT ABDOMEN AND PELVIS WITH CONTRAST TECHNIQUE: Multidetector CT imaging of the  abdomen and pelvis was performed using the standard protocol following bolus administration of intravenous contrast. RADIATION DOSE REDUCTION: This  exam was performed according to the departmental dose-optimization program which includes automated exposure control, adjustment of the mA and/or kV according to patient size and/or use of iterative reconstruction technique. CONTRAST:  OMNIPAQUE  IOHEXOL  300 MG/ML  SOLN COMPARISON:  CTA 01/12/2023. Older standard CT scans as well including September 2024. PET-CT 10/28/2022. MRI 10/18/2022. FINDINGS: Lower chest: Linear opacity lung bases likely scar or atelectasis. No pleural effusion. Hepatobiliary: Tiny right-sided dome hepatic cystic focus, unchanged from previous, nonspecific. 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 Pancreas: Severe pancreatic atrophy with ductal dilatation. There is transition point along a low-density mass seen along the head and uncinate process. This mass on series 2, image 27 measures 2.3 by 1.7 cm and is more defined today and larger than previous examinations. This has abuts the SMV. There is some progressive thrombus within the upper SMV which is nonocclusive. Preserved tissue plane to the celiac and SMA. See coronal image 32 of series 4. Spleen: Slightly lobular spleen which is nonenlarged. Adrenals/Urinary Tract: Prominent bilateral parapelvic renal cysts bilaterally. Small parenchymal cysts. No enhancing mass or collecting system dilatation. Preserved contours of the urinary bladder. Stomach/Bowel: Once again there is significant wall thickening and edema along the antrum and pylorus of the stomach. Please correlate for gastritis. There is some increasing adjacent stranding. Stomach is nondilated. Small bowel is nondilated. Large bowel has a normal course and caliber with some scattered colonic stool. There is slight wall thickening along portion of the hepatic flexure in the porta hepatis with some adjacent increasing fluid. Normal appendix seen in  the right lower quadrant. Vascular/Lymphatic: Aortic atherosclerosis. No enlarged abdominal or pelvic lymph nodes. Reproductive: Uterus and bilateral adnexa are unremarkable. Other: No free intra-air. Musculoskeletal: Streak artifact related to the significant hardware along the lumbar spine. Other areas of advanced degenerative changes along the thoracolumbar region. Degenerative changes of the pelvis. Critical Value/emergent results were called by telephone at the time of interpretation on 01/29/2023 at 1:45 pm to provider [facility] , who verbally acknowledged these results. 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 Again there is a low-density masslike area along the uncinate process with associated severe ductal dilatation in caliber change. This has consistent with patient's history of pancreatic neoplasm. No developing lymph node enlargement or new liver lesion at this time. Progressive wall thickening along the distal stomach towards the antrum and pylorus with the edema. Please correlate for peptic ulcer disease or other process. Increasing adjacent stranding and trace fluid extending towards the porta hepatis. Electronically Signed   By: Ranell Bring M.D.   On: 01/29/2023 13:53   CT ANGIO GI BLEED Result Date: 01/12/2023 CLINICAL DATA:  Abdominal pain, melena, history of pancreatic cancer * Tracking Code: BO * EXAM: CTA ABDOMEN AND PELVIS WITHOUT AND WITH CONTRAST TECHNIQUE: Multidetector CT imaging of the abdomen and pelvis was performed using the standard protocol during bolus administration of intravenous contrast. Multiplanar reconstructed images and MIPs were obtained and reviewed to evaluate the vascular anatomy. RADIATION DOSE REDUCTION: This exam was performed according to the departmental  dose-optimization program which includes  automated exposure control, adjustment of the mA and/or kV according to patient size and/or use of iterative reconstruction technique. CONTRAST:  75mL OMNIPAQUE  IOHEXOL  350 MG/ML SOLN COMPARISON:  PET-CT, 10/28/2022, CT abdomen pelvis, 09/30/2022 FINDINGS: VASCULAR Normal contour and caliber of the abdominal aorta. No evidence of aneurysm, dissection, or other acute aortic pathology. Standard branching pattern of the abdominal aorta with solitary bilateral renal arteries. Moderate mixed calcific atherosclerosis. Review of the MIP images confirms the above findings. NON-VASCULAR Lower Chest: No acute findings.  Coronary artery calcifications. Hepatobiliary: No solid liver abnormality is seen. Unchanged subcentimeter low-attenuation no gallstones, gallbladder wall thickening, or biliary dilatation. Pancreas: Unchanged post treatment appearance of the pancreatic head (series 15, image 25). Severe ductal dilatation and pancreatic atrophy distal to the pancreatic head, duct again measuring up to 1.1 cm in caliber (series 15, image 21). Spleen: Normal in size without significant abnormality. Adrenals/Urinary Tract: Adrenal glands are unremarkable. Numerous benign bilateral parapelvic renal cysts, for which no further follow-up or characterization is required. Kidneys are otherwise normal, without renal calculi, solid lesion, or hydronephrosis. Bladder is unremarkable. Stomach/Bowel: Thickening of the gastric antrum and pylorus (series 15, image 21). Status post appendectomy. No evidence of bowel wall thickening, distention, or inflammatory changes. Lymphatic: No enlarged abdominal or pelvic lymph nodes. Reproductive: No mass or other significant abnormality. Other: No abdominal wall hernia or abnormality. No ascites. Musculoskeletal: No acute osseous findings. IMPRESSION: 1. Thickening of the gastric antrum and pylorus, suggestive of gastritis; as on prior examinations this is  possibly secondary to radiation therapy. 2. No intraluminal contrast extravasation or other findings to specifically localize reported GI bleeding. 3. Normal contour and caliber of the abdominal aorta. No evidence of aneurysm, dissection, or other acute aortic pathology. Moderate atherosclerosis. 4. Unchanged post treatment appearance of the pancreatic head. Severe ductal dilatation and pancreatic atrophy distal to the pancreatic head. 5. Coronary artery disease. Aortic Atherosclerosis (ICD10-I70.0). Electronically Signed   By: Marolyn JONETTA Jaksch M.D.   On: 01/12/2023 14:43   Addendum I have seen the patient, examined her. I agree with the assessment and and plan and have edited the notes.   Pt had localized pancreatic cancer in the head which was diagnosed in June 2023, status postchemotherapy and consolidation radiation.  Her tumor was found to be not resectable doing surgical attempt.  Her last treatment was in April 2024.  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  Her tumor marker CA 19.9 has also been trending up lately.  Recent CT abdomen pelvis from January 01/14/2019 5 showed stable pancreatic mass, no evidence of liver or other metastasis.  She is scheduled for IR TIP procedure and possible thrombectomy.  I recommend her follow-up with Dr. Katragadda after hospital discharge, to discuss starting chemotherapy for disease control.  She agrees with the plan, outpatient PET scan can be considered for further evaluation of disease progression.  No additional oncological workup needed during her hospital admission.  I spent a total of 50 minutes for her visit today, more than 50% time on face-to-face counseling.  Onita Mattock MD 02/08/2023

## 2023-02-08 NOTE — Plan of Care (Signed)

## 2023-02-08 NOTE — Progress Notes (Addendum)
 Gastroenterology Inpatient Follow Up    Subjective: Patient has continued to see black stools today.  She thinks that this is about the same as yesterday.  She spoke to IR today and she would like to move forward with the TIPS procedure.  Objective: Vital signs in last 24 hours: Temp:  [98.2 F (36.8 C)-98.6 F (37 C)] 98.2 F (36.8 C) (01/14 0400) Pulse Rate:  [78-91] 78 (01/14 0400) Resp:  [19-22] 22 (01/14 0400) BP: (104-139)/(58-68) 104/58 (01/14 0400) SpO2:  [96 %-100 %] 100 % (01/14 0400) Last BM Date : 02/07/23  Intake/Output from previous day: 01/13 0701 - 01/14 0700 In: 1884 [P.O.:500; I.V.:1384] Out: 350 [Urine:350] Intake/Output this shift: No intake/output data recorded.  General appearance: alert and cooperative Resp: No increased WOB Cardio: Regular rate GI: Nontender, nondistended Extremities: No BLE edema  Lab Results: Recent Labs    02/06/23 1214 02/07/23 0208 02/08/23 0329  WBC 4.1 3.7* 3.5*  HGB 9.2* 8.4* 8.0*  HCT 29.5* 26.7* 25.3*  PLT 168 147* 133*   BMET Recent Labs    02/06/23 1214 02/07/23 0208 02/08/23 0329  NA 140 139 140  K 3.8 3.6 3.6  CL 111 111 112*  CO2 21* 20* 23  GLUCOSE 155* 114* 136*  BUN 24* 17 9  CREATININE 0.82 0.74 0.73  CALCIUM  9.0 8.5* 8.4*   LFT Recent Labs    02/08/23 0329  PROT 5.0*  ALBUMIN  2.5*  AST 23  ALT 28  ALKPHOS 89  BILITOT 0.6   PT/INR No results for input(s): LABPROT, INR in the last 72 hours. Hepatitis Panel No results for input(s): HEPBSAG, HCVAB, HEPAIGM, HEPBIGM in the last 72 hours. C-Diff No results for input(s): CDIFFTOX in the last 72 hours.  Studies/Results: CT ABDOMEN PELVIS W CONTRAST Result Date: 02/06/2023 CLINICAL DATA:  Pancreatitis, acute, severe Patient presents with abdominal pain. Radiologic records indicates history of pancreatic cancer. EXAM: CT ABDOMEN AND PELVIS WITH CONTRAST TECHNIQUE: Multidetector CT imaging of the abdomen and pelvis was  performed using the standard protocol following bolus administration of intravenous contrast. RADIATION DOSE REDUCTION: This exam was performed according to the departmental dose-optimization program which includes automated exposure control, adjustment of the mA and/or kV according to patient size and/or use of iterative reconstruction technique. CONTRAST:  75mL OMNIPAQUE  IOHEXOL  350 MG/ML SOLN COMPARISON:  CT 8 days ago.  PET CT 10/28/2022 reviewed FINDINGS: Lower chest: Again seen linear atelectasis or scarring in the left greater than right lower lobe. 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 No pleural effusion. Hepatobiliary: Tiny hypodensity in the right lobe of the liver is unchanged series 3, image 9. No new intrahepatic abnormality. There is progressive portal vein thrombus, increasing volume of intrahepatic portal vein thrombus. The gallbladder is not well seen. Common bile duct is poorly defined on the current exam. Pancreas: Pancreatic atrophy with ductal dilatation, unchanged over the last 8 days. The known hypodense pancreatic mass is grossly unchanged measuring 2.2 cm series 3, image 23. This is less well-defined than on prior exam. Lesion abuts the SMV with progressive SMV thrombus. Peripancreatic fat stranding about the pancreatic head is minor. Spleen: Bilobed appearance of the spleen. No focal splenic abnormality. Adrenals/Urinary Tract: No adrenal nodule. Bilateral parapelvic cysts. No further follow-up imaging is recommended. No hydronephrosis or renal inflammation. Moderate bladder distension, no wall thickening. Stomach/Bowel: Detailed bowel assessment is limited in the absence of enteric contrast. Paraesophageal varices. Wall thickening about the distal stomach, reference series 3, image 21.  Again seen wall thickening of the duodenum with adjacent stranding, for example series 3, image 25. No small bowel obstruction or additional small bowel wall thickening. No small bowel  pneumatosis. There is wall thickening about the hepatic flexure of the colon series 3, image 26, increased from prior exam. Transverse colon is redundant. Small to moderate colonic stool burden. Vascular/Lymphatic: Progressive thrombus within the superior mesenteric vein which is now occlusive. Progressive thrombus within the main and intrahepatic portal veins. The splenic vein remains patent. Increasing portosystemic collaterals. Aortic atherosclerosis. No bulky adenopathy. Reproductive: Nonacute. Other: Right upper quadrant fat stranding which is adjacent to gastric, duodenal and colonic wall thickening. No frank ascites. No free air. Right lateral lumbar hernia contains only fat. Musculoskeletal: Posterior rod with intrapedicular screw fusion L2 through L5. Additional multilevel degenerative change in the spine. No evidence of focal bone lesion. IMPRESSION: 1. Progressive thrombus within the superior mesenteric vein over the last 8 days, now occlusive. Progressive thrombus within the main and intrahepatic portal veins. 2. Known hypodense pancreatic mass is grossly unchanged measuring 2.2 cm. This is less well-defined than on prior exam. 3. Peripancreatic fat stranding is mild. 4. Persistent and progressive wall thickening about the distal stomach, duodenum, and hepatic flexure of the colon. Progressive right upper quadrant fat stranding. Etiology of bowel wall thickening and inflammation is indeterminate. 5. Increasing portosystemic collaterals. Paraesophageal varices. Aortic Atherosclerosis (ICD10-I70.0). Electronically Signed   By: Tanecia Gasman M.D.   On: 02/06/2023 17:21    Medications: I have reviewed the patient's current medications. Scheduled:  buPROPion   300 mg Oral QPC breakfast   busPIRone   20 mg Oral BID   Chlorhexidine  Gluconate Cloth  6 each Topical Daily   cholecalciferol   5,000 Units Oral q morning   escitalopram   20 mg Oral QHS   linaclotide   72 mcg Oral QAC breakfast   mirtazapine    15 mg Oral QHS   pantoprazole  (PROTONIX ) IV  40 mg Intravenous Q12H   QUEtiapine   25 mg Oral BID   sodium chloride  flush  10-40 mL Intracatheter Q12H   sucralfate   1 g Oral TID WC & HS   Continuous: PRN:sodium chloride   Assessment/Plan: 78 year old female with history of PE on Eliquis , IDA, AVMs, pancreatic adenocarcinoma status post chemoradiation, PVT, non-occlusive SMV thrombus presented with melena and anemia.  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  Patient underwent EGD 02/06/22 that showed bleeding GAVE that was treated with APC and non-bleeding gastric ulcers.  Patient was previously scheduled to see IR for discussion of TIPS approach for portal thrombectomy.  Patient spoke to IR today and opted to proceed with the TIPS with possible thrombectomy procedure.  Plan is for TIPS with possible thrombectomy tomorrow.  Patient does continue to have melena at this time, though this may represent clearance of old blood.  Patient's hemoglobin today is 8 from 8.4 yesterday. -Continue to trend hemoglobin -Continue PPI twice daily and sucralfate  4 times daily -Advance to soft diet today. NPO after midnight for TIPS with possible thrombectomy tomorrow. Patient can be restarted on blood thinner pending TIPS procedure.   LOS: 2 days   Rosario JAYSON Kidney MD 02/08/2023, 2:10 PM

## 2023-02-09 ENCOUNTER — Encounter (HOSPITAL_COMMUNITY)
Admission: EM | Disposition: A | Discharge status: Home / Self Care | Payer: Self-pay | Source: Home / Self Care | Attending: Internal Medicine

## 2023-02-09 ENCOUNTER — Inpatient Hospital Stay (HOSPITAL_COMMUNITY): Payer: Medicare Other | Admitting: Anesthesiology

## 2023-02-09 ENCOUNTER — Encounter (HOSPITAL_COMMUNITY): Payer: Self-pay | Admitting: Family Medicine

## 2023-02-09 ENCOUNTER — Other Ambulatory Visit (HOSPITAL_COMMUNITY): Payer: Self-pay

## 2023-02-09 ENCOUNTER — Inpatient Hospital Stay (HOSPITAL_COMMUNITY): Payer: Medicare Other

## 2023-02-09 DIAGNOSIS — F32A Depression, unspecified: Secondary | ICD-10-CM

## 2023-02-09 DIAGNOSIS — K2901 Acute gastritis with bleeding: Secondary | ICD-10-CM | POA: Diagnosis not present

## 2023-02-09 DIAGNOSIS — I10 Essential (primary) hypertension: Secondary | ICD-10-CM

## 2023-02-09 DIAGNOSIS — K551 Chronic vascular disorders of intestine: Secondary | ICD-10-CM

## 2023-02-09 HISTORY — PX: IR THROMBECT VENO MECH MOD SED: IMG2300

## 2023-02-09 HISTORY — PX: IR EMBO VENOUS NOT HEMORR HEMANG  INC GUIDE ROADMAPPING: IMG5447

## 2023-02-09 HISTORY — PX: IR US GUIDE VASC ACCESS RIGHT: IMG2390

## 2023-02-09 HISTORY — PX: IR TIPS: IMG2295

## 2023-02-09 HISTORY — PX: TIPS PROCEDURE: SHX808

## 2023-02-09 LAB — COMPREHENSIVE METABOLIC PANEL
ALT: 27 U/L (ref 0–44)
AST: 22 U/L (ref 15–41)
Albumin: 2.6 g/dL — ABNORMAL LOW (ref 3.5–5.0)
Alkaline Phosphatase: 100 U/L (ref 38–126)
Anion gap: 8 (ref 5–15)
BUN: 9 mg/dL (ref 8–23)
CO2: 22 mmol/L (ref 22–32)
Calcium: 8.5 mg/dL — ABNORMAL LOW (ref 8.9–10.3)
Chloride: 111 mmol/L (ref 98–111)
Creatinine, Ser: 0.84 mg/dL (ref 0.44–1.00)
GFR, Estimated: >60 mL/min (ref >60)
Glucose, Bld: 109 mg/dL — ABNORMAL HIGH (ref 70–99)
Potassium: 3.6 mmol/L (ref 3.5–5.1)
Sodium: 141 mmol/L (ref 135–145)
Total Bilirubin: 0.6 mg/dL (ref 0.0–1.2)
Total Protein: 5.3 g/dL — ABNORMAL LOW (ref 6.5–8.1)

## 2023-02-09 LAB — CBC WITH DIFFERENTIAL/PLATELET
Abs Immature Granulocytes: 0.01 10*3/uL (ref 0.00–0.07)
Basophils Absolute: 0 10*3/uL (ref 0.0–0.1)
Basophils Relative: 0 %
Eosinophils Absolute: 0.1 10*3/uL (ref 0.0–0.5)
Eosinophils Relative: 2 %
HCT: 26.1 % — ABNORMAL LOW (ref 36.0–46.0)
Hemoglobin: 8.1 g/dL — ABNORMAL LOW (ref 12.0–15.0)
Immature Granulocytes: 0 %
Lymphocytes Relative: 16 %
Lymphs Abs: 0.6 10*3/uL — ABNORMAL LOW (ref 0.7–4.0)
MCH: 29.2 pg (ref 26.0–34.0)
MCHC: 31 g/dL (ref 30.0–36.0)
MCV: 94.2 fL (ref 80.0–100.0)
Monocytes Absolute: 0.6 10*3/uL (ref 0.1–1.0)
Monocytes Relative: 16 %
Neutro Abs: 2.5 10*3/uL (ref 1.7–7.7)
Neutrophils Relative %: 66 %
Platelets: 157 10*3/uL (ref 150–400)
RBC: 2.77 MIL/uL — ABNORMAL LOW (ref 3.87–5.11)
RDW: 16.3 % — ABNORMAL HIGH (ref 11.5–15.5)
WBC: 3.7 10*3/uL — ABNORMAL LOW (ref 4.0–10.5)
nRBC: 0 % (ref 0.0–0.2)

## 2023-02-09 LAB — BRAIN NATRIURETIC PEPTIDE: B Natriuretic Peptide: 16.5 pg/mL (ref 0.0–100.0)

## 2023-02-09 LAB — PROTIME-INR
INR: 1.1 (ref 0.8–1.2)
Prothrombin Time: 13.9 s (ref 11.4–15.2)

## 2023-02-09 LAB — MAGNESIUM: Magnesium: 2.1 mg/dL (ref 1.7–2.4)

## 2023-02-09 SURGERY — INSERTION, SHUNT, INTRAHEPATIC PORTOSYSTEMIC, TRANSJUGULAR
Anesthesia: General

## 2023-02-09 MED ORDER — CHLORHEXIDINE GLUCONATE 0.12 % MT SOLN
OROMUCOSAL | Status: AC
  Administered 2023-02-09: 15 mL via OROMUCOSAL
  Filled 2023-02-09: qty 15

## 2023-02-09 MED ORDER — PROPOFOL 10 MG/ML IV BOLUS
INTRAVENOUS | Status: DC | PRN
  Administered 2023-02-09: 60 mg via INTRAVENOUS

## 2023-02-09 MED ORDER — ONDANSETRON HCL 4 MG/2ML IJ SOLN
INTRAMUSCULAR | Status: DC | PRN
  Administered 2023-02-09: 4 mg via INTRAVENOUS

## 2023-02-09 MED ORDER — HYDROMORPHONE HCL 1 MG/ML IJ SOLN: 0.2500 mg | INTRAMUSCULAR | Status: DC | PRN

## 2023-02-09 MED ORDER — PHENYLEPHRINE HCL-NACL 20-0.9 MG/250ML-% IV SOLN
INTRAVENOUS | Status: DC | PRN
  Administered 2023-02-09: 50 ug/min via INTRAVENOUS

## 2023-02-09 MED ORDER — FENTANYL CITRATE (PF) 100 MCG/2ML IJ SOLN
INTRAMUSCULAR | Status: AC
  Filled 2023-02-09: qty 2

## 2023-02-09 MED ORDER — MORPHINE SULFATE (PF) 2 MG/ML IV SOLN
2.0000 mg | INTRAVENOUS | Status: DC | PRN
  Administered 2023-02-09 – 2023-02-10 (×4): 2 mg via INTRAVENOUS
  Filled 2023-02-09 (×4): qty 1

## 2023-02-09 MED ORDER — LACTATED RINGERS IV SOLN: INTRAVENOUS | Status: DC | PRN

## 2023-02-09 MED ORDER — IOHEXOL 300 MG/ML  SOLN
150.0000 mL | Freq: Once | INTRAMUSCULAR | Status: AC | PRN
  Administered 2023-02-09: 75 mL via INTRAVENOUS

## 2023-02-09 MED ORDER — CHLORHEXIDINE GLUCONATE 0.12 % MT SOLN: 15.0000 mL | Freq: Once | OROMUCOSAL | Status: AC

## 2023-02-09 MED ORDER — IOHEXOL 300 MG/ML  SOLN
150.0000 mL | Freq: Once | INTRAMUSCULAR | Status: AC | PRN
  Administered 2023-02-09: 25 mL via INTRAVENOUS

## 2023-02-09 MED ORDER — LIDOCAINE 2% (20 MG/ML) 5 ML SYRINGE
INTRAMUSCULAR | Status: DC | PRN
  Administered 2023-02-09: 10 mg via INTRAVENOUS

## 2023-02-09 MED ORDER — FENTANYL CITRATE (PF) 250 MCG/5ML IJ SOLN
INTRAMUSCULAR | Status: DC | PRN
  Administered 2023-02-09 (×2): 50 ug via INTRAVENOUS

## 2023-02-09 MED ORDER — ALBUMIN HUMAN 25 % IV SOLN: INTRAVENOUS | Status: DC | PRN

## 2023-02-09 MED ORDER — ENOXAPARIN SODIUM 60 MG/0.6ML IJ SOSY
1.0000 mg/kg | PREFILLED_SYRINGE | Freq: Two times a day (BID) | INTRAMUSCULAR | Status: DC
Start: 2023-02-09 — End: 2023-02-13
  Administered 2023-02-09 – 2023-02-13 (×8): 60 mg via SUBCUTANEOUS
  Filled 2023-02-09 (×8): qty 0.6

## 2023-02-09 MED ORDER — FENTANYL CITRATE (PF) 100 MCG/2ML IJ SOLN
25.0000 ug | INTRAMUSCULAR | Status: DC | PRN
  Administered 2023-02-09: 25 ug via INTRAVENOUS
  Administered 2023-02-09: 50 ug via INTRAVENOUS

## 2023-02-09 MED ORDER — ORAL CARE MOUTH RINSE: 15.0000 mL | Freq: Once | OROMUCOSAL | Status: AC

## 2023-02-09 MED ORDER — AMISULPRIDE (ANTIEMETIC) 5 MG/2ML IV SOLN
INTRAVENOUS | Status: AC
  Filled 2023-02-09: qty 4

## 2023-02-09 MED ORDER — LACTATED RINGERS IV SOLN: INTRAVENOUS | Status: DC

## 2023-02-09 MED ORDER — SUGAMMADEX SODIUM 200 MG/2ML IV SOLN
INTRAVENOUS | Status: DC | PRN
  Administered 2023-02-09: 100 mg via INTRAVENOUS
  Administered 2023-02-09: 50 mg via INTRAVENOUS

## 2023-02-09 MED ORDER — AMISULPRIDE (ANTIEMETIC) 5 MG/2ML IV SOLN
10.0000 mg | Freq: Once | INTRAVENOUS | Status: AC | PRN
  Administered 2023-02-09: 10 mg via INTRAVENOUS

## 2023-02-09 MED ORDER — DEXAMETHASONE SODIUM PHOSPHATE 10 MG/ML IJ SOLN
INTRAMUSCULAR | Status: DC | PRN
  Administered 2023-02-09: 5 mg via INTRAVENOUS

## 2023-02-09 MED ORDER — SODIUM CHLORIDE 0.9 % IV SOLN: 12.5000 mg | INTRAVENOUS | Status: DC | PRN

## 2023-02-09 MED ORDER — OXYCODONE HCL 5 MG/5ML PO SOLN: 5.0000 mg | Freq: Once | ORAL | Status: DC | PRN

## 2023-02-09 MED ORDER — OXYCODONE HCL 5 MG PO TABS: 5.0000 mg | ORAL_TABLET | Freq: Once | ORAL | Status: DC | PRN

## 2023-02-09 MED ORDER — ROCURONIUM BROMIDE 10 MG/ML (PF) SYRINGE
PREFILLED_SYRINGE | INTRAVENOUS | Status: DC | PRN
  Administered 2023-02-09 (×2): 20 mg via INTRAVENOUS
  Administered 2023-02-09: 40 mg via INTRAVENOUS

## 2023-02-09 MED ORDER — PHENYLEPHRINE 80 MCG/ML (10ML) SYRINGE FOR IV PUSH (FOR BLOOD PRESSURE SUPPORT)
PREFILLED_SYRINGE | INTRAVENOUS | Status: DC | PRN
  Administered 2023-02-09: 80 ug via INTRAVENOUS

## 2023-02-09 NOTE — Anesthesia Preprocedure Evaluation (Signed)
 Anesthesia Evaluation  Patient identified by MRN, date of birth, ID band Patient awake    Reviewed: Allergy & Precautions, NPO status , Patient's Chart, lab work & pertinent test results  Airway Mallampati: II  TM Distance: >3 FB Neck ROM: Full    Dental  (+) Dental Advisory Given   Pulmonary neg pulmonary ROS   breath sounds clear to auscultation       Cardiovascular hypertension, + DVT  + dysrhythmias  Rhythm:Regular Rate:Normal     Neuro/Psych negative neurological ROS     GI/Hepatic Neg liver ROS,GERD  ,,Multiple GI bleeds   Endo/Other  negative endocrine ROS    Renal/GU negative Renal ROS     Musculoskeletal  (+) Arthritis ,    Abdominal   Peds  Hematology  (+) Blood dyscrasia, anemia   Anesthesia Other Findings   Reproductive/Obstetrics                             Anesthesia Physical Anesthesia Plan  ASA: 4  Anesthesia Plan: General   Post-op Pain Management:    Induction: Intravenous  PONV Risk Score and Plan: 3 and Dexamethasone , Ondansetron  and Treatment may vary due to age or medical condition  Airway Management Planned: Oral ETT  Additional Equipment: Arterial line  Intra-op Plan:   Post-operative Plan: Extubation in OR  Informed Consent: I have reviewed the patients History and Physical, chart, labs and discussed the procedure including the risks, benefits and alternatives for the proposed anesthesia with the patient or authorized representative who has indicated his/her understanding and acceptance.     Dental advisory given  Plan Discussed with: CRNA  Anesthesia Plan Comments:        Anesthesia Quick Evaluation

## 2023-02-09 NOTE — Anesthesia Postprocedure Evaluation (Signed)
 Anesthesia Post Note  [name]  Procedure(s) Performed: TRANS-JUGULAR INTRAHEPATIC PORTAL SHUNT (TIPS)     Patient location during evaluation: PACU Anesthesia Type: General Level of consciousness: awake and alert Pain management: pain level controlled Vital Signs Assessment: post-procedure vital signs reviewed and stable Respiratory status: spontaneous breathing, nonlabored ventilation, respiratory function stable and patient connected to nasal cannula oxygen Cardiovascular status: blood pressure returned to baseline and stable Postop Assessment: no apparent nausea or vomiting Anesthetic complications: no   No notable events documented.  Last Vitals:  Vitals:   02/09/23 1615 02/09/23 1630  BP: 134/64 128/66  Pulse: 87 99  Resp: 12 12  Temp:    SpO2: 98% 97%    Last Pain:  Vitals:   02/09/23 1630  TempSrc:   PainSc: Asleep                 Lethaniel Rave

## 2023-02-09 NOTE — Transfer of Care (Signed)
 Immediate Anesthesia Transfer of Care Note  Patient: Diamonde Kruszewski  Procedure(s) Performed: TRANS-JUGULAR INTRAHEPATIC PORTAL SHUNT (TIPS)  Patient Location: PACU  Anesthesia Type:General  Level of Consciousness: awake, alert , and oriented  Airway & Oxygen Therapy: Patient Spontanous Breathing and Patient connected to nasal cannula oxygen  Post-op Assessment: Report given to RN and Post -op Vital signs reviewed and stable  Post vital signs: Reviewed and stable  Last Vitals:  Vitals Value Taken Time  BP 126/59 02/09/23 1531  Temp    Pulse 84 02/09/23 1537  Resp 17 02/09/23 1537  SpO2 98 % 02/09/23 1537  Vitals shown include unfiled device data.  Last Pain:  Vitals:   02/09/23 1022  TempSrc: Oral  PainSc: 0-No pain      Patients Stated Pain Goal: 2 (02/09/23 1022)  Complications: No notable events documented.

## 2023-02-09 NOTE — Procedures (Addendum)
 Interventional Radiology Procedure Note  Procedure:  1) TIPS creation 2) Aspiration and mechanical portal thrombectomy 3) Main portal vein and SMV drug coated balloon angioplasty 4) Embolization of left gastric vein  Findings: Please refer to procedural dictation for full description.  Copious thrombus aspirated from SMV and main portal.  Sample sent for Pathology.  7+2 cm Viatorr placed, dilated to 10 mm.     Right greater saphenous vein 8 Fr access, right internal jugular vein 16 Fr access.  Complications: None immediate  Estimated Blood Loss: 200 mL  Recommendations: 1 mg/kg Lovenox  administered in IR after hemostasis.  Recommend therapeutic BID dosing for 1 month prior to transitioning to oral anticoagulation. Recommend AM labs to include CBC, CMP, INR. Low threshold for resuscitative blood transfusion. Low threshold for initiation of lactulose  if any signs of encephalopathy. IR will follow.   Creasie Doctor, MD Pager: [phone] Clinic: [phone]

## 2023-02-09 NOTE — Anesthesia Procedure Notes (Addendum)
 Procedure Name: Intubation Date/Time: 02/09/2023 11:56 AM  Performed by: Shamra Bradeen A, CRNAPre-anesthesia Checklist: Patient identified, Emergency Drugs available, Suction available and Patient being monitored Patient Re-evaluated:Patient Re-evaluated prior to induction Oxygen Delivery Method: Circle System Utilized Preoxygenation: Pre-oxygenation with 100% oxygen Induction Type: IV induction Ventilation: Mask ventilation without difficulty Laryngoscope Size: Glidescope and 3 Grade View: Grade I Tube type: Oral Tube size: 7.0 mm Number of attempts: 1 Airway Equipment and Method: Stylet and Oral airway Placement Confirmation: ETT inserted through vocal cords under direct vision, positive ETCO2 and breath sounds checked- equal and bilateral Secured at: 22 cm Tube secured with: Tape Dental Injury: Teeth and Oropharynx as per pre-operative assessment

## 2023-02-09 NOTE — Care Management Important Message (Signed)
 Important Message  Patient Details  Name: Sheryl Bender MRN: [id number] Date of Birth: 1945/05/10   Important Message Given:  Yes - Medicare IM     [name] 02/09/2023, 4:09 PM

## 2023-02-09 NOTE — Progress Notes (Signed)
 Brief GI Follow Up Note: Patient went for TIPS with partial thrombectomy today so she was not able to be seen. Hb has remained stable for the last 2 days. Suspect that her GI bleeding has resolved at this time. Continue PPI BID and complete 4 weeks sucralfate  1 g QID therapy. Patient can follow up with [facility] as an outpatient. She will benefit from a repeat EGD in 2-3 months to assess for healing of her gastric ulcers and to follow up her GAVE. GI will sign off for now. Please call back if any new questions arise.  Regino Caprio, MD

## 2023-02-09 NOTE — Progress Notes (Signed)
 PROGRESS NOTE                                                                                                                                                                                                             Patient Demographics:    Sheryl Bender, is a 78 y.o. female, DOB - 20-Jan-1946, ZOX:[id number]  Outpatient Primary MD for the patient is Ava Lei, DO    LOS - 3  Admit date - 02/06/2023    Chief Complaint  Patient presents with   Abdominal Pain   Weakness       Brief Narrative (HPI from H&P)   78 y.o. female with medical history significant of Stage 1 pancreatic cancer s/p chemoradiation, pulmonary embolism on Eliquis , iron-deficiency anemia, interstitial pancreatitis, HTN, recurrent GI bleed who presents with melena and weakness.    Patient hospitalized from 12/2-12/5 with GI bleed requiring transfusion. 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  Colonoscopy with no active bleeding, minimal amount of old blood in the cecum, 6 mm polyp removed from mid sigmoid colon.    She then resumed Eliquis  on 12/11 and was re-admitted from 12/18-12/21 with recurrent GI bleed requiring transfusion. EGD found to have hemorrhagic gastritis, treated with APC, bipolar cautery.  GAVE is also possibility.  GI recommended PPI twice daily for 4 weeks then daily indefinitely. Asked to resume Eliquis  on 12/28.   Hospitalized again 1/4 -1/8 for GI bleed. EGD Gastritis with hemorrhage. Treated with argon plasma coagulation (APC). Non-bleeding gastric ulcers with no stigmata of bleeding. Duodenitis. CT abdomen and pelvis with contrast-question of the nonocclusive thrombus in the SMV but most significant thrombus not seen in the portal vein including the main portal vein. low-density masslike area along the uncinate process with associated severe ductal dilatation in  caliber change. Plan was for IR thrombectomy on 1/21 and to hold any systemic anticoagulation.   She now presented to the ER on 02/06/2023 for reoccurrence of GI bleed.   Subjective:   Patient in bed, appears comfortable, denies any headache, no fever, no chest pain or pressure, no shortness of breath , no abdominal pain. No new focal weakness.    Assessment  & Plan :    Recurrent upper GI bleed fourth episode in the last 4 weeks, bleeding likely  being caused by.  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  Seen by GI underwent EGD on 02/07/2023 by Dr. Rosaline Coma, found bleeding GAVE in the antrum treated with APC, also several Forrest class III ulcers in gastric antrum.  On PPI, Carafate , liquid diet, GI on board.  IR also considering TIPS procedure to decompress portal pressure.  Need to monitor CBC and have long-term goals of care.  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 CT abdomen shows progressive thrombus in SMV into portal vein.  ?clot vs tumor.  IR planning thrombectomy along with TIPS procedure on 02/09/2023, have involved oncology and palliative care along with GI this admission to provide long-term prognosis and goals of care.  Patient's pancreatic cancer was being treated at [facility] by  Dr. Katragadda, was currently being monitored off of chemotherapy for the last 3 months, she also went to [facility] for a Whipple's procedure but it could not be done due to the location of her tumor.  Unfortunately CA 19.9 is now trending up and currently quite high, case discussed with Dr. Maryalice Smaller oncologist on 02/09/2023, she saw the patient on 02/08/2023 according to her her clinical scenario suggest possible local recurrence of her pancreatic adenocarcinoma, long-term prognosis  is not good however she wants the patient to follow-up with her primary oncologist for further input.  Malignant neoplasm of pancreas (HCC) CA19-9 has increased from normal 4 months ago to 220 this week.  Kindly see above  Essential hypertension BP normal, diet controlled  Depression - Continue bupropion , Buspar , Lexapro  and mirtazapine        Condition - Extremely Guarded  Family Communication  : Discussed with patient's son Veryl Gottron [phone]  on 02/08/2023 at 10:15 AM  Code Status : Full code  Consults  : GI, IR, oncology, palliative care  PUD Prophylaxis : PPI   Procedures  :     CT - 1. Progressive thrombus within the superior mesenteric vein over the last 8 days, now occlusive. Progressive thrombus within the main and intrahepatic portal veins. 2. Known hypodense pancreatic mass is grossly unchanged measuring 2.2 cm. This is less well-defined than on prior exam. 3. Peripancreatic fat stranding is mild. 4. Persistent and progressive wall thickening about the distal stomach, duodenum, and hepatic flexure of the colon. Progressive right upper quadrant fat stranding. Etiology of bowel wall thickening and inflammation is indeterminate. 5. Increasing portosystemic collaterals. Paraesophageal varices. Aortic Atherosclerosis (ICD10-I70.0)      Disposition Plan  :    Status is: Inpatient   DVT Prophylaxis  :    SCDs Start: 02/06/23 1929   Lab Results  Component Value Date   PLT 157 02/09/2023    Diet :  Diet Order             Diet NPO time specified  Diet effective midnight                    Inpatient Medications  Scheduled Meds:  buPROPion   300 mg Oral QPC breakfast   busPIRone   20 mg Oral BID   Chlorhexidine  Gluconate Cloth  6 each Topical Daily   cholecalciferol   5,000 Units Oral q morning   escitalopram   20 mg Oral QHS   linaclotide   72 mcg Oral QAC breakfast   mirtazapine   15 mg Oral QHS   pantoprazole  (PROTONIX ) IV  40 mg Intravenous Q12H    QUEtiapine   25 mg Oral BID   sodium chloride  flush  10-40 mL Intracatheter Q12H   sodium chloride  flush  10-40 mL Intracatheter Q12H   sucralfate   1 g Oral TID WC & HS   Continuous Infusions:  cefTRIAXone  (ROCEPHIN )  IV     PRN Meds:.sodium chloride   Antibiotics  :    Anti-infectives (From admission, onward)    Start     Dose/Rate Route Frequency Ordered Stop   02/09/23 1045  cefTRIAXone  (ROCEPHIN ) 2 g in sodium chloride  0.9 % 100 mL IVPB        2 g 200 mL/hr over 30 Minutes Intravenous To Radiology 02/08/23 1525 02/10/23 1045         Objective:   Vitals:   02/09/23 0405 02/09/23 0410 02/09/23 0415 02/09/23 0420  BP:   (!) 114/56   Pulse: 90 89 89 (!) 101  Resp: (!) 21 17 (!) 22 (!) 24  Temp:   97.9 F (36.6 C)   TempSrc:   Oral   SpO2: 95% 96% 96% 95%  Weight:      Height:        Wt Readings from Last 3 Encounters:  02/06/23 58.6 kg  02/03/23 58.6 kg  01/29/23 59.9 kg     Intake/Output Summary (Last 24 hours) at 02/09/2023 0805 Last data filed at 02/08/2023 2200 Gross per 24 hour  Intake 200 ml  Output --  Net 200 ml     Physical Exam  Awake Alert, No new F.N deficits, Normal affect [place].AT,PERRAL Supple Neck, No JVD,   Symmetrical Chest wall movement, Good air movement bilaterally, CTAB RRR,No Gallops,Rubs or new Murmurs,  +ve B.Sounds, Abd Soft, No tenderness,   No Cyanosis, Clubbing or edema       Data Review:    Recent Labs  Lab 02/03/23 1552 02/06/23 1214 02/07/23 0208 02/08/23 0329 02/09/23 0404  WBC 4.1 4.1 3.7* 3.5* 3.7*  HGB 10.4* 9.2* 8.4* 8.0* 8.1*  HCT 33.5* 29.5* 26.7* 25.3* 26.1*  PLT 208 168 147* 133* 157  MCV 95 95.5 95.7 93.7 94.2  MCH 29.5 29.8 30.1 29.6 29.2  MCHC 31.0* 31.2 31.5 31.6 31.0  RDW 15.2 16.5* 16.4* 16.2* 16.3*  LYMPHSABS 0.9  --   --   --  0.6*  MONOABS  --   --   --   --  0.6  EOSABS 0.0  --   --   --  0.1  BASOSABS 0.0  --   --   --  0.0    Recent Labs  Lab 02/06/23 1214 02/07/23 0208  02/08/23 0329 02/09/23 0404  NA 140 139 140 141  K 3.8 3.6 3.6 3.6  CL 111 111 112* 111  CO2 21* 20* 23 22  ANIONGAP 8 8 5 8   GLUCOSE 155* 114* 136* 109*  BUN 24* 17 9 9   CREATININE 0.82 0.74 0.73 0.84  AST 34  --  23 22  ALT 40  --  28 27  ALKPHOS 110  --  89 100  BILITOT 0.4  --  0.6 0.6  ALBUMIN  3.0*  --  2.5* 2.6*  INR  --   --   --  1.1  BNP  --   --   --  16.5  MG  --   --   --  2.1  CALCIUM  9.0 8.5* 8.4* 8.5*  Recent Labs  Lab 02/06/23 1214 02/07/23 0208 02/08/23 0329 02/09/23 0404  INR  --   --   --  1.1  BNP  --   --   --  16.5  MG  --   --   --  2.1  CALCIUM  9.0 8.5* 8.4* 8.5*    --------------------------------------------------------------------------------------------------------------- No results found for: "CHOL", "HDL", "LDLCALC", "LDLDIRECT", "TRIG", "CHOLHDL"  No results found for: "HGBA1C" No results for input(s): "TSH", "T4TOTAL", "FREET4", "T3FREE", "THYROIDAB" in the last 72 hours. No results for input(s): "VITAMINB12", "FOLATE", "FERRITIN", "TIBC", "IRON", "RETICCTPCT" in the last 72 hours. ------------------------------------------------------------------------------------------------------------------ Cardiac Enzymes No results for input(s): "CKMB", "TROPONINI", "MYOGLOBIN" in the last 168 hours.  Invalid input(s): "CK"  Micro Results No results found for this or any previous visit (from the past 240 hours).  Radiology Reports CT ABDOMEN PELVIS W CONTRAST Result Date: 02/06/2023 CLINICAL DATA:  Pancreatitis, acute, severe Patient presents with abdominal pain. Radiologic records indicates history of pancreatic cancer. EXAM: CT ABDOMEN AND PELVIS WITH CONTRAST TECHNIQUE: Multidetector CT imaging of the abdomen and pelvis was performed using the standard protocol following bolus administration of intravenous contrast. RADIATION DOSE REDUCTION: This exam was performed according to the departmental dose-optimization program which includes  automated exposure control, adjustment of the mA and/or kV according to patient size and/or use of iterative reconstruction technique. CONTRAST:  75mL OMNIPAQUE  IOHEXOL  350 MG/ML SOLN COMPARISON:  CT 8 days ago.  PET CT 10/28/2022 reviewed FINDINGS: Lower chest: Again seen linear atelectasis or scarring in the left greater than right lower lobe. 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 No pleural effusion. Hepatobiliary: Tiny hypodensity in the right lobe of the liver is unchanged series 3, image 9. No new intrahepatic abnormality. There is progressive portal vein thrombus, increasing volume of intrahepatic portal vein thrombus. The gallbladder is not well seen. Common bile duct is poorly defined on the current exam. Pancreas: Pancreatic atrophy with ductal dilatation, unchanged over the last 8 days. The known hypodense pancreatic mass is grossly unchanged measuring 2.2 cm series 3, image 23. This is less well-defined than on prior exam. Lesion abuts the SMV with progressive SMV thrombus. Peripancreatic fat stranding about the pancreatic head is minor. Spleen: Bilobed appearance of the spleen. No focal splenic abnormality. Adrenals/Urinary Tract: No adrenal nodule. Bilateral parapelvic cysts. No further follow-up imaging is recommended. No hydronephrosis or renal inflammation. Moderate bladder distension, no wall thickening. Stomach/Bowel: Detailed bowel assessment is limited in the absence of enteric contrast. Paraesophageal varices. Wall thickening about the distal stomach, reference series 3, image 21. Again seen wall thickening of the duodenum with adjacent stranding, for example series 3, image 25. No small bowel obstruction or additional small bowel wall thickening. No small bowel pneumatosis. There is wall thickening about the hepatic flexure of the colon series 3, image 26, increased from prior exam. Transverse colon is redundant. Small to moderate colonic stool burden. Vascular/Lymphatic:  Progressive thrombus within the superior mesenteric vein which is now occlusive. Progressive thrombus within the main and intrahepatic portal veins. The splenic vein remains patent. Increasing portosystemic collaterals. Aortic atherosclerosis. No bulky adenopathy. Reproductive: Nonacute. Other: Right upper quadrant fat stranding which is adjacent to gastric, duodenal and colonic wall thickening. No frank ascites. No free air. Right lateral lumbar hernia contains only fat. Musculoskeletal: Posterior rod with intrapedicular screw fusion L2 through L5. Additional multilevel degenerative change in the spine. No evidence of focal bone lesion. IMPRESSION: 1. Progressive thrombus within the superior mesenteric vein over the last 8  days, now occlusive. Progressive thrombus within the main and intrahepatic portal veins. 2. Known hypodense pancreatic mass is grossly unchanged measuring 2.2 cm. This is less well-defined than on prior exam. 3. Peripancreatic fat stranding is mild. 4. Persistent and progressive wall thickening about the distal stomach, duodenum, and hepatic flexure of the colon. Progressive right upper quadrant fat stranding. Etiology of bowel wall thickening and inflammation is indeterminate. 5. Increasing portosystemic collaterals. Paraesophageal varices. Aortic Atherosclerosis (ICD10-I70.0). Electronically Signed   By: Chadwick Colonel M.D.   On: 02/06/2023 17:21      Signature  -   Lynnwood Sauer M.D on 02/09/2023 at 8:05 AM   -  To page go to [email or web address]

## 2023-02-09 NOTE — Anesthesia Procedure Notes (Signed)
 Arterial Line Insertion Start/End1/15/2025 11:13 AM, 02/09/2023 11:18 AM Performed by: Peggy Bowens, MD, anesthesiologist  Patient location: Pre-op. Preanesthetic checklist: patient identified, IV checked, site marked, risks and benefits discussed, surgical consent, monitors and equipment checked, pre-op evaluation, timeout performed and anesthesia consent Lidocaine  1% used for infiltration Left, radial was placed Catheter size: 20 G Hand hygiene performed  and maximum sterile barriers used   Attempts: 1 Procedure performed using ultrasound guided technique. Ultrasound Notes:anatomy identified, needle tip was noted to be adjacent to the nerve/plexus identified, no ultrasound evidence of intravascular and/or intraneural injection and image(s) printed for medical record Following insertion, dressing applied and Biopatch. Post procedure assessment: normal and unchanged  Post procedure complications: second provider assisted. Patient tolerated the procedure well with no immediate complications.

## 2023-02-10 ENCOUNTER — Encounter (HOSPITAL_COMMUNITY): Payer: Self-pay | Admitting: Interventional Radiology

## 2023-02-10 DIAGNOSIS — K2901 Acute gastritis with bleeding: Secondary | ICD-10-CM | POA: Diagnosis not present

## 2023-02-10 LAB — CBC WITH DIFFERENTIAL/PLATELET
Abs Immature Granulocytes: 0.03 10*3/uL (ref 0.00–0.07)
Basophils Absolute: 0 10*3/uL (ref 0.0–0.1)
Basophils Relative: 0 %
Eosinophils Absolute: 0 10*3/uL (ref 0.0–0.5)
Eosinophils Relative: 0 %
HCT: 22.4 % — ABNORMAL LOW (ref 36.0–46.0)
Hemoglobin: 7.1 g/dL — ABNORMAL LOW (ref 12.0–15.0)
Immature Granulocytes: 0 %
Lymphocytes Relative: 8 %
Lymphs Abs: 0.6 10*3/uL — ABNORMAL LOW (ref 0.7–4.0)
MCH: 30.3 pg (ref 26.0–34.0)
MCHC: 31.7 g/dL (ref 30.0–36.0)
MCV: 95.7 fL (ref 80.0–100.0)
Monocytes Absolute: 0.8 10*3/uL (ref 0.1–1.0)
Monocytes Relative: 10 %
Neutro Abs: 6.7 10*3/uL (ref 1.7–7.7)
Neutrophils Relative %: 82 %
Platelets: 171 10*3/uL (ref 150–400)
RBC: 2.34 MIL/uL — ABNORMAL LOW (ref 3.87–5.11)
RDW: 16.7 % — ABNORMAL HIGH (ref 11.5–15.5)
WBC: 8.2 10*3/uL (ref 4.0–10.5)
nRBC: 0 % (ref 0.0–0.2)

## 2023-02-10 LAB — CBC
HCT: 20.9 % — ABNORMAL LOW (ref 36.0–46.0)
HCT: 26.7 % — ABNORMAL LOW (ref 36.0–46.0)
Hemoglobin: 6.7 g/dL — CL (ref 12.0–15.0)
Hemoglobin: 8.6 g/dL — ABNORMAL LOW (ref 12.0–15.0)
MCH: 30.2 pg (ref 26.0–34.0)
MCH: 28.9 pg (ref 26.0–34.0)
MCHC: 32.1 g/dL (ref 30.0–36.0)
MCHC: 32.2 g/dL (ref 30.0–36.0)
MCV: 94.1 fL (ref 80.0–100.0)
MCV: 89.6 fL (ref 80.0–100.0)
Platelets: 134 10*3/uL — ABNORMAL LOW (ref 150–400)
Platelets: 129 10*3/uL — ABNORMAL LOW (ref 150–400)
RBC: 2.22 MIL/uL — ABNORMAL LOW (ref 3.87–5.11)
RBC: 2.98 MIL/uL — ABNORMAL LOW (ref 3.87–5.11)
RDW: 16.6 % — ABNORMAL HIGH (ref 11.5–15.5)
RDW: 18.1 % — ABNORMAL HIGH (ref 11.5–15.5)
WBC: 8.6 10*3/uL (ref 4.0–10.5)
WBC: 8.2 10*3/uL (ref 4.0–10.5)
nRBC: 0 % (ref 0.0–0.2)
nRBC: 0 % (ref 0.0–0.2)

## 2023-02-10 LAB — COMPREHENSIVE METABOLIC PANEL
ALT: 82 U/L — ABNORMAL HIGH (ref 0–44)
AST: 91 U/L — ABNORMAL HIGH (ref 15–41)
Albumin: 2.7 g/dL — ABNORMAL LOW (ref 3.5–5.0)
Alkaline Phosphatase: 89 U/L (ref 38–126)
Anion gap: 10 (ref 5–15)
BUN: 14 mg/dL (ref 8–23)
CO2: 22 mmol/L (ref 22–32)
Calcium: 8 mg/dL — ABNORMAL LOW (ref 8.9–10.3)
Chloride: 108 mmol/L (ref 98–111)
Creatinine, Ser: 0.93 mg/dL (ref 0.44–1.00)
GFR, Estimated: >60 mL/min (ref >60)
Glucose, Bld: 130 mg/dL — ABNORMAL HIGH (ref 70–99)
Potassium: 3.5 mmol/L (ref 3.5–5.1)
Sodium: 140 mmol/L (ref 135–145)
Total Bilirubin: 0.7 mg/dL (ref 0.0–1.2)
Total Protein: 5.2 g/dL — ABNORMAL LOW (ref 6.5–8.1)

## 2023-02-10 LAB — SURGICAL PATHOLOGY

## 2023-02-10 LAB — PREPARE RBC (CROSSMATCH)

## 2023-02-10 LAB — MAGNESIUM: Magnesium: 2.1 mg/dL (ref 1.7–2.4)

## 2023-02-10 LAB — BRAIN NATRIURETIC PEPTIDE: B Natriuretic Peptide: 57.1 pg/mL (ref 0.0–100.0)

## 2023-02-10 MED ORDER — SODIUM CHLORIDE 0.9% IV SOLUTION: Freq: Once | INTRAVENOUS | Status: AC

## 2023-02-10 MED ORDER — POTASSIUM CHLORIDE 20 MEQ PO PACK
40.0000 meq | PACK | Freq: Once | ORAL | Status: AC
  Administered 2023-02-10: 40 meq via ORAL
  Filled 2023-02-10: qty 2

## 2023-02-10 MED ORDER — HYDROCODONE-ACETAMINOPHEN 7.5-325 MG PO TABS
1.0000 | ORAL_TABLET | Freq: Four times a day (QID) | ORAL | Status: DC | PRN
  Administered 2023-02-10 (×3): 1 via ORAL
  Filled 2023-02-10 (×4): qty 1

## 2023-02-10 MED ORDER — POLYETHYLENE GLYCOL 3350 17 G PO PACK
17.0000 g | PACK | Freq: Every day | ORAL | Status: DC
Start: 2023-02-10 — End: 2023-02-13
  Administered 2023-02-10 – 2023-02-11 (×2): 17 g via ORAL
  Filled 2023-02-10 (×3): qty 1

## 2023-02-10 MED ORDER — POTASSIUM CHLORIDE CRYS ER 20 MEQ PO TBCR: 40.0000 meq | EXTENDED_RELEASE_TABLET | Freq: Once | ORAL | Status: DC

## 2023-02-10 NOTE — Progress Notes (Addendum)
Patient is alert, awake, complained pain sometimes despite having 2 mg IV Morphine Q4. Heart rate consistently elevated 103- 110 . MD notified. Foley is patent,  Competed  CHG  Foley care.  R internal jugular and R groin dressing site , intact, dry, no bruising , no bleeding, palpable pulses. Urine  clear, yellow.

## 2023-02-10 NOTE — Plan of Care (Signed)

## 2023-02-10 NOTE — Plan of Care (Signed)
  Problem: Coping: Goal: Level of anxiety will decrease Outcome: Progressing   Problem: Pain Management: Goal: General experience of comfort will improve Outcome: Progressing

## 2023-02-10 NOTE — Progress Notes (Signed)
Physical Therapy Treatment Patient Details Name: Sheryl Bender MRN: [id number] DOB: 1945-05-13 Today's Date: 02/10/2023   History of Present Illness Pt is 78 year old presented to [facility] on  02/06/23 for recurrence of GI bleed. 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 Underwent EGD and for possible TIPS procedure. PMH - whipple procedure, OA, anxiety, depression, pancreatic adenocarcinoma, PE.    PT Comments  Pt received in supine and agreeable to session. Pt reports increased pain and fatigue today limiting activity tolerance. Pt able to tolerate sitting EOB and 3 standing trials with standing marches and lateral steps towards HOB. Pt demonstrates increased instability this session and requires UE support on footboard during marches to prevent LOB. Pt requires frequent rest breaks due to quick fatigue and pt's HR noted to increase up to 140's with mobility, so unable to progress ambulation this session. Pt continues to benefit from PT services to progress toward functional mobility goals.     If plan is discharge home, recommend the following: Assist for transportation;Assistance with cooking/housework   Can travel by private vehicle        Equipment Recommendations  None recommended by PT    Recommendations for Other Services       Precautions / Restrictions Precautions Precautions: Fall Restrictions Weight Bearing Restrictions Per Provider Order: No     Mobility  Bed Mobility Overal bed mobility: Modified Independent             General bed mobility comments: increased time and HOB elevated    Transfers Overall transfer level: Needs assistance Equipment used: None Transfers: Sit to/from Stand Sit to Stand: Contact guard assist           General transfer comment: STS from low EOB with CGA for safety due to unsteadiness. Pt requires multiple attempts at times due to weakness    Ambulation/Gait                General Gait Details: deferred due to fatigue and elevated HR      Balance Overall balance assessment: Needs assistance Sitting-balance support: No upper extremity supported, Feet supported Sitting balance-Leahy Scale: Normal Sitting balance - Comments: sitting EOB   Standing balance support: No upper extremity supported, During functional activity Standing balance-Leahy Scale: Poor Standing balance comment: standing at EOB with pt requiring UE support for balance during marches                            Cognition Arousal: Alert Behavior During Therapy: WFL for tasks assessed/performed Overall Cognitive Status: Within Functional Limits for tasks assessed                                          Exercises General Exercises - Lower Extremity Hip Flexion/Marching: AROM, Both, 10 reps, Standing    General Comments        Pertinent Vitals/Pain Pain Assessment Pain Assessment: Faces Faces Pain Scale: Hurts a little bit Pain Location: abdomen Pain Descriptors / Indicators: Grimacing, Guarding Pain Intervention(s): Monitored during session, Repositioned, Limited activity within patient's tolerance     PT Goals (current goals can now be found in the care plan section) Acute Rehab PT Goals Patient Stated Goal: get stronger PT Goal Formulation: With patient Time For Goal Achievement: 02/22/23 Progress towards PT goals: Progressing toward goals  Frequency    Min 1X/week       AM-PAC PT "6 Clicks" Mobility   Outcome Measure  Help needed turning from your back to your side while in a flat bed without using bedrails?: None Help needed moving from lying on your back to sitting on the side of a flat bed without using bedrails?: None Help needed moving to and from a bed to a chair (including a wheelchair)?: A Little Help needed standing up from a chair using your arms (e.g., wheelchair or bedside chair)?: A Little Help needed to  walk in hospital room?: A Little Help needed climbing 3-5 steps with a railing? : A Little 6 Click Score: 20    End of Session   Activity Tolerance: Patient limited by fatigue;Other (comment) (elevated HR) Patient left: in bed;with call bell/phone within reach Nurse Communication: Mobility status;Patient requests pain meds;Other (comment) (HR) PT Visit Diagnosis: Unsteadiness on feet (R26.81);Muscle weakness (generalized) (M62.81)     Time: 1610-9604 PT Time Calculation (min) (ACUTE ONLY): 27 min  Charges:    $Therapeutic Exercise: 8-22 mins $Therapeutic Activity: 8-22 mins PT General Charges $$ ACUTE PT VISIT: 1 Visit                     Johny Shock, PTA Acute Rehabilitation Services Secure Chat Preferred  [phone]    Johny Shock 02/10/2023, 3:29 PM

## 2023-02-10 NOTE — Progress Notes (Addendum)
Weyman Pedro   DOB:August 12, 1945   ZO#:[id number]      ASSESSMENT & PLAN:  1.  Pancreatic adenocarcinoma (T1 N0 M0), stage I - Cytology 07/09/2021 confirmed adenocarcinoma. - PET scan and MRI was done July 2023, showed no evidence of abdominal metastatic disease. - Initiated chemotherapy FOLFOX 08/27/2021 and C2 given FOLFIRINOX on 09/30/2021.  Patient was scheduled to have a Whipple which was aborted due to extensive scarring.  Xeloda initiated 04/05/2022.    - Elevated CA 19.9 at 226 on 02/01/2023.  Was wnl until 12/10/2022 when increased to 86. - No inpatient chemotherapy planned and no other oncologic workup planned during hospital admission. - May need outpatient PET CT scan for further evaluation of disease progression. - Follows with medical oncology/[name]. - Medical oncology/[name] will cover during this hospitalization.   2.  History of pulmonary embolism Superior mesenteric vein thrombus, worsening - Diagnosed January 2023. - Status post Eliquis for 60 days - TIPS procedure done 02/09/2023 by IR - Currently on Lovenox status post TIPS procedure.   3.  Anemia - Hemoglobin low 6.7 today.  Agree with 1 unit PRBC transfusion today which is transfusing at this time.  Repeat CBC with differential in AM. 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  Colonoscopy also done with no active bleeding noted. - Transfuse PRBC for Hgb <7.0 - Monitor CBC with differential closely   4.  Thrombocytopenia - Mild - Platelets low 134K - No transfusional requirements at this time - Monitor closely for bleeding   Subjective:  Patient is seen awake and alert supine in bed.  Ill-appearing.  Currently has PRBC transfusion ongoing, tolerating well.  Reports that she feels very fatigued today.  Also complains of some pain of the lower abdomen.  No other acute distress is  noted.  Objective:  Vitals:   02/10/23 0800 02/10/23 1222  BP: 120/66 118/62  Pulse: 99 (!) 113  Resp: 13 19  Temp: 99.3 F (37.4 C) 98.7 F (37.1 C)  SpO2: 97% 97%     Intake/Output Summary (Last 24 hours) at 02/10/2023 1340 Last data filed at 02/10/2023 1029 Gross per 24 hour  Intake 1500 ml  Output 1325 ml  Net 175 ml     REVIEW OF SYSTEMS:   Constitutional: +Fatigue, denies fevers, chills or abnormal night sweats Eyes: Denies blurriness of vision, double vision or watery eyes Ears, nose, mouth, throat, and face: Denies mucositis or sore throat Respiratory: Denies cough, dyspnea or wheezes Cardiovascular: Denies palpitation, chest discomfort or lower extremity swelling Gastrointestinal:  Denies nausea, heartburn or change in bowel habits Skin: Denies abnormal skin rashes Lymphatics: Denies new lymphadenopathy or easy bruising Neurological: Denies numbness, tingling or new weaknesses Behavioral/Psych: Mood is stable, no new changes  All other systems were reviewed with the patient and are negative.  PHYSICAL EXAMINATION: ECOG PERFORMANCE STATUS: 3 - Symptomatic, >50% confined to bed  Vitals:   02/10/23 0800 02/10/23 1222  BP: 120/66 118/62  Pulse: 99 (!) 113  Resp: 13 19  Temp: 99.3 F (37.4 C) 98.7 F (37.1 C)  SpO2: 97% 97%   Filed Weights   02/06/23 1210 02/09/23 1022  Weight: 129 lb 3.2 oz (58.6 kg) 130 lb (59 kg)    GENERAL: +Ill-appearing, alert, no distress and comfortable SKIN: +Pale skin color, texture, turgor are normal, no rashes or significant lesions EYES: normal, conjunctiva are pink and non-injected,  sclera clear OROPHARYNX: no exudate, no erythema and lips, buccal mucosa, and tongue normal  NECK: supple, thyroid normal size, non-tender, without nodularity LYMPH: no palpable lymphadenopathy in the cervical, axillary or inguinal LUNGS: clear to auscultation and percussion with normal breathing effort HEART: regular rate & rhythm and no  murmurs and no lower extremity edema ABDOMEN: abdomen soft, non-tender and normal bowel sounds MUSCULOSKELETAL: no cyanosis of digits and no clubbing  PSYCH: alert & oriented x 3 with fluent speech NEURO: no focal motor/sensory deficits   All questions were answered. The patient knows to call the clinic with any problems, questions or concerns.   The total time spent in the appointment was 40 minutes encounter with patient including review of chart and various tests results, discussions about plan of care and coordination of care plan  Dietrich Pates Rouson, NP 02/10/2023 1:40 PM    Labs Reviewed:  Lab Results  Component Value Date   WBC 8.6 02/10/2023   HGB 6.7 (LL) 02/10/2023   HCT 20.9 (L) 02/10/2023   MCV 94.1 02/10/2023   PLT 134 (L) 02/10/2023   Recent Labs    02/08/23 0329 02/09/23 0404 02/10/23 0040  NA 140 141 140  K 3.6 3.6 3.5  CL 112* 111 108  CO2 23 22 22   GLUCOSE 136* 109* 130*  BUN 9 9 14   CREATININE 0.73 0.84 0.93  CALCIUM 8.4* 8.5* 8.0*  GFRNONAA >60 >60 >60  PROT 5.0* 5.3* 5.2*  ALBUMIN 2.5* 2.6* 2.7*  AST 23 22 91*  ALT 28 27 82*  ALKPHOS 89 100 89  BILITOT 0.6 0.6 0.7    Studies Reviewed:  IR Tips Result Date: 02/09/2023 CLINICAL DATA:  78 year old female with history of pancreatic cancer status post radiation with development of progressive acute portal vein thrombus and recurrent gastric hemorrhage. EXAM: 1. Ultrasound-guided access of the right internal jugular vein 2. Ultrasound-guided access of the right greater saphenous vein 3. Hepatic venogram 4. Intravascular ultrasound 5. Catheterization of the portal vein 6. Portal venogram 7. Creation of a transhepatic portal vein to hepatic vein shunt 8. Aspiration thrombectomy of the portal vein and superior mesenteric vein 9. Mechanical thrombectomy of the portal vein and superior mesenteric vein 10. Drug coated balloon angioplasty of the main portal vein and superior mesenteric vein 11. Embolization of left  gastric vein MEDICATIONS: As antibiotic prophylaxis, Rocephin 1 gm IV was ordered pre-procedure and administered intravenously within one hour of incision. ANESTHESIA/SEDATION: General - as administered by the Anesthesia department CONTRAST:  One hundred ML Omnipaque 300, intravenous FLUOROSCOPY TIME:  2,580 mGy COMPLICATIONS: None immediate. PROCEDURE: The procedure was performed in concert with my partner Dr. Katherina Right. Informed written consent was obtained from the patient after a thorough discussion of the procedural risks, benefits and alternatives. All questions were addressed. Maximal Sterile Barrier Technique was utilized including caps, mask, sterile gowns, sterile gloves, sterile drape, hand hygiene and skin antiseptic. A timeout was performed prior to the initiation of the procedure. A preliminary ultrasound of the right groin was performed and demonstrates a patent right common femoral vein and central greater saphenous vein. A permanent ultrasound image was recorded. Using a combination of fluoroscopy and ultrasound, an access site was determined. A small dermatotomy was made at the planned puncture site. Using ultrasound guidance, access into the right greater saphenous vein was obtained with visualization of needle entry into the vessel using a standard micropuncture technique. A wire was advanced into the IVC insert all fascial dilation performed. An 8  [place], 11 cm vascular sheath was placed into the external iliac vein. Through this access site, an 42 [place] ICE catheter was advanced with ease under fluoroscopic guidance to the level of the intrahepatic inferior vena cava. A preliminary ultrasound of the right neck was performed and demonstrates a patent internal jugular vein. A permanent ultrasound image was recorded. Using a combination of fluoroscopy and ultrasound, an access site was determined. A small dermatotomy was made at the planned puncture site. Using ultrasound guidance, access  into the right internal jugular vein was obtained with visualization of needle entry into the vessel using a standard micropuncture technique. A wire was advanced into the IVC and serial fascial dilation performed. A 10 French tips sheath was placed into the internal jugular vein and advanced to the IVC. The jugular sheath was retracted into the right atrium and manometry was performed measuring a mean pressure of 11 mmHg. A 5 French angled tip catheter was then directed into the right hepatic vein. Hepatic venogram was performed. These images demonstrated patent hepatic vein with no stenosis. The catheter was advanced to a wedge portion of the a patent vein over which the 10 French sheath was advanced into the right hepatic vein. Using ICE ultrasound visualization the catheter as right hepatic vein as well as the portal anatomy was defined. There was acute appearing, mildly expansile heterogeneously hypoechoic thrombus throughout the portal veins. A planned exit site from the hepatic vein and puncture site from the portal vein was placed into a single sonographic plane. Under direct ultrasound visualization, the ScorpionX needle was advanced into the central right portal vein. A Glidewire Advantage was then advanced however coursed laterally in appeared to follow and extrahepatic location. Upon manipulating to preserve portal venous access, hepatic vein access was lost with the base sheath. Therefore, the jugular sheath was then retracted into the right atrium and a 5 French angled tip catheter was directed into the middle hepatic vein. Hepatic venogram was performed. These images demonstrated patent hepatic vein with no stenosis. The catheter was advanced to a wedged portion of the patent vein over which the 10 French sheath was advanced into the middle hepatic vein. Using ICE ultrasound visualization a planned exit site from the hepatic vein and portal puncture site were placed into a single sonographic plane.  Under direct ultrasound visualization, the ScorpionX needle was advanced into the central left portal vein. There is moderate redundancy in looping of the wire in the left portal vein prior to entering the main portal vein which required several manipulation techniques to reduce including balloon dilation and insertion of a stiff buddy wire. A 5 French marking pigtail catheter was then advanced over the wire into the superior mesenteric vein and wire removed. Portal venogram was performed which demonstrated occlusive thrombus throughout the central superior mesenteric vein and main portal vein. The tract was then dilated to 8 mm with an 8 mm x 8 cm Athletis balloon. A 8-10 mm by 7 + 2 cm of Viatorr endograft was placed. This was dilated to 8 mm. The indwelling 10 French sheath was then exchanged for a 16 French, 33 cm dry seal sheath. The sheath was directed to the portal aspect of the endograft. Aspiration thrombectomy was then performed in multiple passes over the wire through the main portal vein and into the central superior mesenteric vein. Moderate acute and chronic appearing thrombus were collected in the aspiration canister. A sample was sent for pathology. Repeat portal venogram demonstrated persistent occlusive appearing  thrombus in the main portal vein and irregular luminal filling defects in the central superior mesenteric vein. Therefore, balloon angioplasty was performed with a 6 mm x 8 cm Athletis balloon. Repeat portal venogram demonstrated no evidence of extravasation in mildly restored luminal gain in the central superior mesenteric vein. This region was then treated with a 6 mm x 8 cm In.Pact drug coated balloon prolonged inflation for total of 3 minutes. After angioplasty, the central superior mesenteric vein appear patent with inline flow into the indwelling tips endograft. Additional balloon angioplasty through the tips endograft within 8 mm balloon was performed. Repeat portal venogram  demonstrated minimal improved patency. Additional aspiration thrombectomy was performed through the tips endograft which yielded moderate acute appearing thrombus. Repeat portal venogram demonstrated persistent near occlusive thrombus throughout the indwelling tips endograft in main portal vein. Therefore, mechanical thrombectomy was performed through the tips endograft and main portal vein into the central superior mesenteric vein with a 6 [place] cleaner device undergoing multiple passes. Repeat portal venogram demonstrated improved patency and inline flow via the indwelling tips, however with multiple irregular luminal filling defects and sluggish flow. Retrograde flow was noted into the left gastric vein which filled multiple small gastroesophageal varices. The indwelling tips endograft was then dilated with a 10 mm x 80 mm Athletis balloon. Repeat portal venogram demonstrated improved patency and antegrade flow through the indwelling endograft. There is persistent retrograde filling of the left gastric vein. Therefore, a 5 French C2 catheter was used to select the left gastric vein. Left gastric venogram demonstrated retrograde flow with opacification and irregular vascularity about multifocal small gastroesophageal varices. Therefore, coil embolization was performed about the central left gastric vein with 2, 6 mm 0.035 "Azur detachable coils. Completion portal venogram demonstrated successfully embolized left gastric vein with antegrade flow via patent main portal vein in newly placed TIPS endograft, however several scattered filling defects remain in the main portal vein and in the endograft. The catheters and sheath were removed and manual compression was applied to the right internal jugular and right greater saphenous venous access sites until hemostasis was achieved. The patient was administered 60 mg of subcutaneous Lovenox. The patient was transferred to the PACU in stable condition. IMPRESSION: 1.  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 2. 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 3. Technically successful aspiration and mechanical thrombectomy of the superior mesenteric and main portal veins. 4. Technically successful plain and drug coated balloon angioplasty of the superior mesenteric and main portal veins. 5. Technically successful coil embolization of the left gastric vein. PLAN: Begin therapeutic Lovenox for 1 month prior to transitioning back to oral anticoagulation. IR will follow while inpatient and arrange outpatient followup within 1 month after discharge. Marliss Coots, MD [facility] Radiology Electronically Signed   By: Marliss Coots M.D.   On: 02/09/2023 22:10   IR US Guide Vasc Access Right Result Date: 02/09/2023 CLINICAL DATA:  78 year old female with history of pancreatic cancer status post radiation with development of progressive acute portal vein thrombus and recurrent gastric hemorrhage. EXAM: 1. Ultrasound-guided access of the right internal jugular vein 2. Ultrasound-guided access of the right greater saphenous vein 3. Hepatic venogram 4. Intravascular ultrasound 5. Catheterization of the portal vein 6. Portal venogram 7. Creation of a transhepatic portal vein to hepatic vein shunt 8. Aspiration thrombectomy of the portal vein and superior mesenteric vein 9. Mechanical thrombectomy of the  portal vein and superior mesenteric vein 10. Drug coated balloon angioplasty of the main portal vein and superior mesenteric vein 11. Embolization of left gastric vein MEDICATIONS: As antibiotic prophylaxis, Rocephin 1 gm IV was ordered pre-procedure and administered intravenously within one hour of incision. ANESTHESIA/SEDATION: General - as administered by the Anesthesia department CONTRAST:  One hundred ML Omnipaque 300,  intravenous FLUOROSCOPY TIME:  2,580 mGy COMPLICATIONS: None immediate. PROCEDURE: The procedure was performed in concert with my partner Dr. Katherina Right. Informed written consent was obtained from the patient after a thorough discussion of the procedural risks, benefits and alternatives. All questions were addressed. Maximal Sterile Barrier Technique was utilized including caps, mask, sterile gowns, sterile gloves, sterile drape, hand hygiene and skin antiseptic. A timeout was performed prior to the initiation of the procedure. A preliminary ultrasound of the right groin was performed and demonstrates a patent right common femoral vein and central greater saphenous vein. A permanent ultrasound image was recorded. Using a combination of fluoroscopy and ultrasound, an access site was determined. A small dermatotomy was made at the planned puncture site. Using ultrasound guidance, access into the right greater saphenous vein was obtained with visualization of needle entry into the vessel using a standard micropuncture technique. A wire was advanced into the IVC insert all fascial dilation performed. An 8 [place], 11 cm vascular sheath was placed into the external iliac vein. Through this access site, an 38 [place] ICE catheter was advanced with ease under fluoroscopic guidance to the level of the intrahepatic inferior vena cava. A preliminary ultrasound of the right neck was performed and demonstrates a patent internal jugular vein. A permanent ultrasound image was recorded. Using a combination of fluoroscopy and ultrasound, an access site was determined. A small dermatotomy was made at the planned puncture site. Using ultrasound guidance, access into the right internal jugular vein was obtained with visualization of needle entry into the vessel using a standard micropuncture technique. A wire was advanced into the IVC and serial fascial dilation performed. A 10 French tips sheath was placed into the internal  jugular vein and advanced to the IVC. The jugular sheath was retracted into the right atrium and manometry was performed measuring a mean pressure of 11 mmHg. A 5 French angled tip catheter was then directed into the right hepatic vein. Hepatic venogram was performed. These images demonstrated patent hepatic vein with no stenosis. The catheter was advanced to a wedge portion of the a patent vein over which the 10 French sheath was advanced into the right hepatic vein. Using ICE ultrasound visualization the catheter as right hepatic vein as well as the portal anatomy was defined. There was acute appearing, mildly expansile heterogeneously hypoechoic thrombus throughout the portal veins. A planned exit site from the hepatic vein and puncture site from the portal vein was placed into a single sonographic plane. Under direct ultrasound visualization, the ScorpionX needle was advanced into the central right portal vein. A Glidewire Advantage was then advanced however coursed laterally in appeared to follow and extrahepatic location. Upon manipulating to preserve portal venous access, hepatic vein access was lost with the base sheath. Therefore, the jugular sheath was then retracted into the right atrium and a 5 French angled tip catheter was directed into the middle hepatic vein. Hepatic venogram was performed. These images demonstrated patent hepatic vein with no stenosis. The catheter was advanced to a wedged portion of the patent vein over which the 10 French sheath was advanced into the middle hepatic vein.  Using ICE ultrasound visualization a planned exit site from the hepatic vein and portal puncture site were placed into a single sonographic plane. Under direct ultrasound visualization, the ScorpionX needle was advanced into the central left portal vein. There is moderate redundancy in looping of the wire in the left portal vein prior to entering the main portal vein which required several manipulation techniques  to reduce including balloon dilation and insertion of a stiff buddy wire. A 5 French marking pigtail catheter was then advanced over the wire into the superior mesenteric vein and wire removed. Portal venogram was performed which demonstrated occlusive thrombus throughout the central superior mesenteric vein and main portal vein. The tract was then dilated to 8 mm with an 8 mm x 8 cm Athletis balloon. A 8-10 mm by 7 + 2 cm of Viatorr endograft was placed. This was dilated to 8 mm. The indwelling 10 French sheath was then exchanged for a 16 French, 33 cm dry seal sheath. The sheath was directed to the portal aspect of the endograft. Aspiration thrombectomy was then performed in multiple passes over the wire through the main portal vein and into the central superior mesenteric vein. Moderate acute and chronic appearing thrombus were collected in the aspiration canister. A sample was sent for pathology. Repeat portal venogram demonstrated persistent occlusive appearing thrombus in the main portal vein and irregular luminal filling defects in the central superior mesenteric vein. Therefore, balloon angioplasty was performed with a 6 mm x 8 cm Athletis balloon. Repeat portal venogram demonstrated no evidence of extravasation in mildly restored luminal gain in the central superior mesenteric vein. This region was then treated with a 6 mm x 8 cm In.Pact drug coated balloon prolonged inflation for total of 3 minutes. After angioplasty, the central superior mesenteric vein appear patent with inline flow into the indwelling tips endograft. Additional balloon angioplasty through the tips endograft within 8 mm balloon was performed. Repeat portal venogram demonstrated minimal improved patency. Additional aspiration thrombectomy was performed through the tips endograft which yielded moderate acute appearing thrombus. Repeat portal venogram demonstrated persistent near occlusive thrombus throughout the indwelling tips endograft  in main portal vein. Therefore, mechanical thrombectomy was performed through the tips endograft and main portal vein into the central superior mesenteric vein with a 6 [place] cleaner device undergoing multiple passes. Repeat portal venogram demonstrated improved patency and inline flow via the indwelling tips, however with multiple irregular luminal filling defects and sluggish flow. Retrograde flow was noted into the left gastric vein which filled multiple small gastroesophageal varices. The indwelling tips endograft was then dilated with a 10 mm x 80 mm Athletis balloon. Repeat portal venogram demonstrated improved patency and antegrade flow through the indwelling endograft. There is persistent retrograde filling of the left gastric vein. Therefore, a 5 French C2 catheter was used to select the left gastric vein. Left gastric venogram demonstrated retrograde flow with opacification and irregular vascularity about multifocal small gastroesophageal varices. Therefore, coil embolization was performed about the central left gastric vein with 2, 6 mm 0.035 "Azur detachable coils. Completion portal venogram demonstrated successfully embolized left gastric vein with antegrade flow via patent main portal vein in newly placed TIPS endograft, however several scattered filling defects remain in the main portal vein and in the endograft. The catheters and sheath were removed and manual compression was applied to the right internal jugular and right greater saphenous venous access sites until hemostasis was achieved. The patient was administered 60 mg of subcutaneous Lovenox. The patient was  transferred to the PACU in stable condition. IMPRESSION: 1. 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 2. 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 3. Technically successful aspiration and mechanical thrombectomy  of the superior mesenteric and main portal veins. 4. Technically successful plain and drug coated balloon angioplasty of the superior mesenteric and main portal veins. 5. Technically successful coil embolization of the left gastric vein. PLAN: Begin therapeutic Lovenox for 1 month prior to transitioning back to oral anticoagulation. IR will follow while inpatient and arrange outpatient followup within 1 month after discharge. Marliss Coots, MD [facility] Radiology Electronically Signed   By: Marliss Coots M.D.   On: 02/09/2023 22:10   IR US Guide Vasc Access Right Result Date: 02/09/2023 CLINICAL DATA:  78 year old female with history of pancreatic cancer status post radiation with development of progressive acute portal vein thrombus and recurrent gastric hemorrhage. EXAM: 1. Ultrasound-guided access of the right internal jugular vein 2. Ultrasound-guided access of the right greater saphenous vein 3. Hepatic venogram 4. Intravascular ultrasound 5. Catheterization of the portal vein 6. Portal venogram 7. Creation of a transhepatic portal vein to hepatic vein shunt 8. Aspiration thrombectomy of the portal vein and superior mesenteric vein 9. Mechanical thrombectomy of the portal vein and superior mesenteric vein 10. Drug coated balloon angioplasty of the main portal vein and superior mesenteric vein 11. Embolization of left gastric vein MEDICATIONS: As antibiotic prophylaxis, Rocephin 1 gm IV was ordered pre-procedure and administered intravenously within one hour of incision. ANESTHESIA/SEDATION: General - as administered by the Anesthesia department CONTRAST:  One hundred ML Omnipaque 300, intravenous FLUOROSCOPY TIME:  2,580 mGy COMPLICATIONS: None immediate. PROCEDURE: The procedure was performed in concert with my partner Dr. Katherina Right. Informed written consent was obtained from the patient after a thorough discussion of the procedural risks, benefits and  alternatives. All questions were addressed. Maximal Sterile Barrier Technique was utilized including caps, mask, sterile gowns, sterile gloves, sterile drape, hand hygiene and skin antiseptic. A timeout was performed prior to the initiation of the procedure. A preliminary ultrasound of the right groin was performed and demonstrates a patent right common femoral vein and central greater saphenous vein. A permanent ultrasound image was recorded. Using a combination of fluoroscopy and ultrasound, an access site was determined. A small dermatotomy was made at the planned puncture site. Using ultrasound guidance, access into the right greater saphenous vein was obtained with visualization of needle entry into the vessel using a standard micropuncture technique. A wire was advanced into the IVC insert all fascial dilation performed. An 8 [place], 11 cm vascular sheath was placed into the external iliac vein. Through this access site, an 50 [place] ICE catheter was advanced with ease under fluoroscopic guidance to the level of the intrahepatic inferior vena cava. A preliminary ultrasound of the right neck was performed and demonstrates a patent internal jugular vein. A permanent ultrasound image was recorded. Using a combination of fluoroscopy and ultrasound, an access site was determined. A small dermatotomy was made at the planned puncture site. Using ultrasound guidance, access into the right internal jugular vein was obtained with visualization of needle entry into the vessel using a standard micropuncture technique. A wire was advanced into the IVC and serial fascial dilation performed. A 10 French tips sheath was placed into the internal jugular vein and advanced to the IVC. The jugular sheath was retracted into the right atrium and  manometry was performed measuring a mean pressure of 11 mmHg. A 5 French angled tip catheter was then directed into the right hepatic vein. Hepatic venogram was performed. These  images demonstrated patent hepatic vein with no stenosis. The catheter was advanced to a wedge portion of the a patent vein over which the 10 French sheath was advanced into the right hepatic vein. Using ICE ultrasound visualization the catheter as right hepatic vein as well as the portal anatomy was defined. There was acute appearing, mildly expansile heterogeneously hypoechoic thrombus throughout the portal veins. A planned exit site from the hepatic vein and puncture site from the portal vein was placed into a single sonographic plane. Under direct ultrasound visualization, the ScorpionX needle was advanced into the central right portal vein. A Glidewire Advantage was then advanced however coursed laterally in appeared to follow and extrahepatic location. Upon manipulating to preserve portal venous access, hepatic vein access was lost with the base sheath. Therefore, the jugular sheath was then retracted into the right atrium and a 5 French angled tip catheter was directed into the middle hepatic vein. Hepatic venogram was performed. These images demonstrated patent hepatic vein with no stenosis. The catheter was advanced to a wedged portion of the patent vein over which the 10 French sheath was advanced into the middle hepatic vein. Using ICE ultrasound visualization a planned exit site from the hepatic vein and portal puncture site were placed into a single sonographic plane. Under direct ultrasound visualization, the ScorpionX needle was advanced into the central left portal vein. There is moderate redundancy in looping of the wire in the left portal vein prior to entering the main portal vein which required several manipulation techniques to reduce including balloon dilation and insertion of a stiff buddy wire. A 5 French marking pigtail catheter was then advanced over the wire into the superior mesenteric vein and wire removed. Portal venogram was performed which demonstrated occlusive thrombus throughout  the central superior mesenteric vein and main portal vein. The tract was then dilated to 8 mm with an 8 mm x 8 cm Athletis balloon. A 8-10 mm by 7 + 2 cm of Viatorr endograft was placed. This was dilated to 8 mm. The indwelling 10 French sheath was then exchanged for a 16 French, 33 cm dry seal sheath. The sheath was directed to the portal aspect of the endograft. Aspiration thrombectomy was then performed in multiple passes over the wire through the main portal vein and into the central superior mesenteric vein. Moderate acute and chronic appearing thrombus were collected in the aspiration canister. A sample was sent for pathology. Repeat portal venogram demonstrated persistent occlusive appearing thrombus in the main portal vein and irregular luminal filling defects in the central superior mesenteric vein. Therefore, balloon angioplasty was performed with a 6 mm x 8 cm Athletis balloon. Repeat portal venogram demonstrated no evidence of extravasation in mildly restored luminal gain in the central superior mesenteric vein. This region was then treated with a 6 mm x 8 cm In.Pact drug coated balloon prolonged inflation for total of 3 minutes. After angioplasty, the central superior mesenteric vein appear patent with inline flow into the indwelling tips endograft. Additional balloon angioplasty through the tips endograft within 8 mm balloon was performed. Repeat portal venogram demonstrated minimal improved patency. Additional aspiration thrombectomy was performed through the tips endograft which yielded moderate acute appearing thrombus. Repeat portal venogram demonstrated persistent near occlusive thrombus throughout the indwelling tips endograft in main portal vein. Therefore, mechanical thrombectomy was  performed through the tips endograft and main portal vein into the central superior mesenteric vein with a 6 [place] cleaner device undergoing multiple passes. Repeat portal venogram demonstrated improved patency  and inline flow via the indwelling tips, however with multiple irregular luminal filling defects and sluggish flow. Retrograde flow was noted into the left gastric vein which filled multiple small gastroesophageal varices. The indwelling tips endograft was then dilated with a 10 mm x 80 mm Athletis balloon. Repeat portal venogram demonstrated improved patency and antegrade flow through the indwelling endograft. There is persistent retrograde filling of the left gastric vein. Therefore, a 5 French C2 catheter was used to select the left gastric vein. Left gastric venogram demonstrated retrograde flow with opacification and irregular vascularity about multifocal small gastroesophageal varices. Therefore, coil embolization was performed about the central left gastric vein with 2, 6 mm 0.035 "Azur detachable coils. Completion portal venogram demonstrated successfully embolized left gastric vein with antegrade flow via patent main portal vein in newly placed TIPS endograft, however several scattered filling defects remain in the main portal vein and in the endograft. The catheters and sheath were removed and manual compression was applied to the right internal jugular and right greater saphenous venous access sites until hemostasis was achieved. The patient was administered 60 mg of subcutaneous Lovenox. The patient was transferred to the PACU in stable condition. IMPRESSION: 1. 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 2. 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 3. Technically successful aspiration and mechanical thrombectomy of the superior mesenteric and main portal veins. 4. Technically successful plain and drug coated balloon angioplasty of the superior mesenteric and main portal veins. 5. Technically successful coil embolization of the left gastric vein. PLAN: Begin therapeutic Lovenox  for 1 month prior to transitioning back to oral anticoagulation. IR will follow while inpatient and arrange outpatient followup within 1 month after discharge. Marliss Coots, MD [facility] Radiology Electronically Signed   By: Marliss Coots M.D.   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 EXAM: 1. Ultrasound-guided access of the right internal jugular vein 2. Ultrasound-guided access of the right greater saphenous vein 3. Hepatic venogram 4. Intravascular ultrasound 5. Catheterization of the portal vein 6. Portal venogram 7. Creation of a transhepatic portal vein to hepatic vein shunt 8. Aspiration thrombectomy of the portal vein and superior mesenteric vein 9. Mechanical thrombectomy of the portal vein and superior mesenteric vein 10. Drug coated balloon angioplasty of the main portal vein and superior mesenteric vein 11. Embolization of left gastric vein MEDICATIONS: As antibiotic prophylaxis, Rocephin 1 gm IV was ordered pre-procedure and administered intravenously within one hour of incision. ANESTHESIA/SEDATION: General - as administered by the Anesthesia department CONTRAST:  One hundred ML Omnipaque 300, intravenous FLUOROSCOPY TIME:  2,580 mGy COMPLICATIONS: None immediate. PROCEDURE: The procedure was performed in concert with my partner Dr. Katherina Right. Informed written consent was obtained from the patient after a thorough discussion of the procedural risks, benefits and alternatives. All questions were addressed. Maximal Sterile Barrier Technique was utilized including caps, mask, sterile gowns, sterile gloves, sterile drape, hand hygiene and skin antiseptic. A timeout was performed prior to the initiation of the procedure. A preliminary  ultrasound of the right groin was  performed and demonstrates a patent right common femoral vein and central greater saphenous vein. A permanent ultrasound image was recorded. Using a combination of fluoroscopy and ultrasound, an access site was determined. A small dermatotomy was made at the planned puncture site. Using ultrasound guidance, access into the right greater saphenous vein was obtained with visualization of needle entry into the vessel using a standard micropuncture technique. A wire was advanced into the IVC insert all fascial dilation performed. An 8 [place], 11 cm vascular sheath was placed into the external iliac vein. Through this access site, an 38 [place] ICE catheter was advanced with ease under fluoroscopic guidance to the level of the intrahepatic inferior vena cava. A preliminary ultrasound of the right neck was performed and demonstrates a patent internal jugular vein. A permanent ultrasound image was recorded. Using a combination of fluoroscopy and ultrasound, an access site was determined. A small dermatotomy was made at the planned puncture site. Using ultrasound guidance, access into the right internal jugular vein was obtained with visualization of needle entry into the vessel using a standard micropuncture technique. A wire was advanced into the IVC and serial fascial dilation performed. A 10 French tips sheath was placed into the internal jugular vein and advanced to the IVC. The jugular sheath was retracted into the right atrium and manometry was performed measuring a mean pressure of 11 mmHg. A 5 French angled tip catheter was then directed into the right hepatic vein. Hepatic venogram was performed. These images demonstrated patent hepatic vein with no stenosis. The catheter was advanced to a wedge portion of the a patent vein over which the 10 French sheath was advanced into the right hepatic vein. Using ICE ultrasound visualization the catheter as right hepatic vein as well  as the portal anatomy was defined. There was acute appearing, mildly expansile heterogeneously hypoechoic thrombus throughout the portal veins. A planned exit site from the hepatic vein and puncture site from the portal vein was placed into a single sonographic plane. Under direct ultrasound visualization, the ScorpionX needle was advanced into the central right portal vein. A Glidewire Advantage was then advanced however coursed laterally in appeared to follow and extrahepatic location. Upon manipulating to preserve portal venous access, hepatic vein access was lost with the base sheath. Therefore, the jugular sheath was then retracted into the right atrium and a 5 French angled tip catheter was directed into the middle hepatic vein. Hepatic venogram was performed. These images demonstrated patent hepatic vein with no stenosis. The catheter was advanced to a wedged portion of the patent vein over which the 10 French sheath was advanced into the middle hepatic vein. Using ICE ultrasound visualization a planned exit site from the hepatic vein and portal puncture site were placed into a single sonographic plane. Under direct ultrasound visualization, the ScorpionX needle was advanced into the central left portal vein. There is moderate redundancy in looping of the wire in the left portal vein prior to entering the main portal vein which required several manipulation techniques to reduce including balloon dilation and insertion of a stiff buddy wire. A 5 French marking pigtail catheter was then advanced over the wire into the superior mesenteric vein and wire removed. Portal venogram was performed which demonstrated occlusive thrombus throughout the central superior mesenteric vein and main portal vein. The tract was then dilated to 8 mm with an 8 mm x 8 cm Athletis balloon. A 8-10 mm by 7 + 2 cm of Viatorr endograft was placed. This was  dilated to 8 mm. The indwelling 10 French sheath was then exchanged for a 16  French, 33 cm dry seal sheath. The sheath was directed to the portal aspect of the endograft. Aspiration thrombectomy was then performed in multiple passes over the wire through the main portal vein and into the central superior mesenteric vein. Moderate acute and chronic appearing thrombus were collected in the aspiration canister. A sample was sent for pathology. Repeat portal venogram demonstrated persistent occlusive appearing thrombus in the main portal vein and irregular luminal filling defects in the central superior mesenteric vein. Therefore, balloon angioplasty was performed with a 6 mm x 8 cm Athletis balloon. Repeat portal venogram demonstrated no evidence of extravasation in mildly restored luminal gain in the central superior mesenteric vein. This region was then treated with a 6 mm x 8 cm In.Pact drug coated balloon prolonged inflation for total of 3 minutes. After angioplasty, the central superior mesenteric vein appear patent with inline flow into the indwelling tips endograft. Additional balloon angioplasty through the tips endograft within 8 mm balloon was performed. Repeat portal venogram demonstrated minimal improved patency. Additional aspiration thrombectomy was performed through the tips endograft which yielded moderate acute appearing thrombus. Repeat portal venogram demonstrated persistent near occlusive thrombus throughout the indwelling tips endograft in main portal vein. Therefore, mechanical thrombectomy was performed through the tips endograft and main portal vein into the central superior mesenteric vein with a 6 [place] cleaner device undergoing multiple passes. Repeat portal venogram demonstrated improved patency and inline flow via the indwelling tips, however with multiple irregular luminal filling defects and sluggish flow. Retrograde flow was noted into the left gastric vein which filled multiple small gastroesophageal varices. The indwelling tips endograft was then dilated with  a 10 mm x 80 mm Athletis balloon. Repeat portal venogram demonstrated improved patency and antegrade flow through the indwelling endograft. There is persistent retrograde filling of the left gastric vein. Therefore, a 5 French C2 catheter was used to select the left gastric vein. Left gastric venogram demonstrated retrograde flow with opacification and irregular vascularity about multifocal small gastroesophageal varices. Therefore, coil embolization was performed about the central left gastric vein with 2, 6 mm 0.035 "Azur detachable coils. Completion portal venogram demonstrated successfully embolized left gastric vein with antegrade flow via patent main portal vein in newly placed TIPS endograft, however several scattered filling defects remain in the main portal vein and in the endograft. The catheters and sheath were removed and manual compression was applied to the right internal jugular and right greater saphenous venous access sites until hemostasis was achieved. The patient was administered 60 mg of subcutaneous Lovenox. The patient was transferred to the PACU in stable condition. IMPRESSION: 1. 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 2. 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 3. Technically successful aspiration and mechanical thrombectomy of the superior mesenteric and main portal veins. 4. Technically successful plain and drug coated balloon angioplasty of the superior mesenteric and main portal veins. 5. Technically successful coil embolization of the left gastric vein. PLAN: Begin therapeutic Lovenox for 1 month prior to transitioning back to oral anticoagulation. IR will follow while inpatient and arrange outpatient followup within 1 month after discharge. Marliss Coots, MD [facility] Radiology Electronically Signed   By:  Marliss Coots M.D.   On: 02/09/2023 22:10   IR EMBO VENOUS NOT HEMORR HEMANG  INC GUIDE ROADMAPPING Result Date: 02/09/2023 CLINICAL  DATA:  78 year old female with history of pancreatic cancer status post radiation with development of progressive acute portal vein thrombus and recurrent gastric hemorrhage. EXAM: 1. Ultrasound-guided access of the right internal jugular vein 2. Ultrasound-guided access of the right greater saphenous vein 3. Hepatic venogram 4. Intravascular ultrasound 5. Catheterization of the portal vein 6. Portal venogram 7. Creation of a transhepatic portal vein to hepatic vein shunt 8. Aspiration thrombectomy of the portal vein and superior mesenteric vein 9. Mechanical thrombectomy of the portal vein and superior mesenteric vein 10. Drug coated balloon angioplasty of the main portal vein and superior mesenteric vein 11. Embolization of left gastric vein MEDICATIONS: As antibiotic prophylaxis, Rocephin 1 gm IV was ordered pre-procedure and administered intravenously within one hour of incision. ANESTHESIA/SEDATION: General - as administered by the Anesthesia department CONTRAST:  One hundred ML Omnipaque 300, intravenous FLUOROSCOPY TIME:  2,580 mGy COMPLICATIONS: None immediate. PROCEDURE: The procedure was performed in concert with my partner Dr. Katherina Right. Informed written consent was obtained from the patient after a thorough discussion of the procedural risks, benefits and alternatives. All questions were addressed. Maximal Sterile Barrier Technique was utilized including caps, mask, sterile gowns, sterile gloves, sterile drape, hand hygiene and skin antiseptic. A timeout was performed prior to the initiation of the procedure. A preliminary ultrasound of the right groin was performed and demonstrates a patent right common femoral vein and central greater saphenous vein. A permanent ultrasound image was recorded. Using a combination of fluoroscopy and ultrasound, an access site was  determined. A small dermatotomy was made at the planned puncture site. Using ultrasound guidance, access into the right greater saphenous vein was obtained with visualization of needle entry into the vessel using a standard micropuncture technique. A wire was advanced into the IVC insert all fascial dilation performed. An 8 [place], 11 cm vascular sheath was placed into the external iliac vein. Through this access site, an 9 [place] ICE catheter was advanced with ease under fluoroscopic guidance to the level of the intrahepatic inferior vena cava. A preliminary ultrasound of the right neck was performed and demonstrates a patent internal jugular vein. A permanent ultrasound image was recorded. Using a combination of fluoroscopy and ultrasound, an access site was determined. A small dermatotomy was made at the planned puncture site. Using ultrasound guidance, access into the right internal jugular vein was obtained with visualization of needle entry into the vessel using a standard micropuncture technique. A wire was advanced into the IVC and serial fascial dilation performed. A 10 French tips sheath was placed into the internal jugular vein and advanced to the IVC. The jugular sheath was retracted into the right atrium and manometry was performed measuring a mean pressure of 11 mmHg. A 5 French angled tip catheter was then directed into the right hepatic vein. Hepatic venogram was performed. These images demonstrated patent hepatic vein with no stenosis. The catheter was advanced to a wedge portion of the a patent vein over which the 10 French sheath was advanced into the right hepatic vein. Using ICE ultrasound visualization the catheter as right hepatic vein as well as the portal anatomy was defined. There was acute appearing, mildly expansile heterogeneously hypoechoic thrombus throughout the portal veins. A planned exit site from the hepatic vein and puncture site from the portal vein was placed into a  single sonographic plane. Under direct ultrasound visualization, the ScorpionX needle was advanced into the central right portal vein. A Glidewire Advantage was then advanced however coursed laterally in  appeared to follow and extrahepatic location. Upon manipulating to preserve portal venous access, hepatic vein access was lost with the base sheath. Therefore, the jugular sheath was then retracted into the right atrium and a 5 French angled tip catheter was directed into the middle hepatic vein. Hepatic venogram was performed. These images demonstrated patent hepatic vein with no stenosis. The catheter was advanced to a wedged portion of the patent vein over which the 10 French sheath was advanced into the middle hepatic vein. Using ICE ultrasound visualization a planned exit site from the hepatic vein and portal puncture site were placed into a single sonographic plane. Under direct ultrasound visualization, the ScorpionX needle was advanced into the central left portal vein. There is moderate redundancy in looping of the wire in the left portal vein prior to entering the main portal vein which required several manipulation techniques to reduce including balloon dilation and insertion of a stiff buddy wire. A 5 French marking pigtail catheter was then advanced over the wire into the superior mesenteric vein and wire removed. Portal venogram was performed which demonstrated occlusive thrombus throughout the central superior mesenteric vein and main portal vein. The tract was then dilated to 8 mm with an 8 mm x 8 cm Athletis balloon. A 8-10 mm by 7 + 2 cm of Viatorr endograft was placed. This was dilated to 8 mm. The indwelling 10 French sheath was then exchanged for a 16 French, 33 cm dry seal sheath. The sheath was directed to the portal aspect of the endograft. Aspiration thrombectomy was then performed in multiple passes over the wire through the main portal vein and into the central superior mesenteric vein.  Moderate acute and chronic appearing thrombus were collected in the aspiration canister. A sample was sent for pathology. Repeat portal venogram demonstrated persistent occlusive appearing thrombus in the main portal vein and irregular luminal filling defects in the central superior mesenteric vein. Therefore, balloon angioplasty was performed with a 6 mm x 8 cm Athletis balloon. Repeat portal venogram demonstrated no evidence of extravasation in mildly restored luminal gain in the central superior mesenteric vein. This region was then treated with a 6 mm x 8 cm In.Pact drug coated balloon prolonged inflation for total of 3 minutes. After angioplasty, the central superior mesenteric vein appear patent with inline flow into the indwelling tips endograft. Additional balloon angioplasty through the tips endograft within 8 mm balloon was performed. Repeat portal venogram demonstrated minimal improved patency. Additional aspiration thrombectomy was performed through the tips endograft which yielded moderate acute appearing thrombus. Repeat portal venogram demonstrated persistent near occlusive thrombus throughout the indwelling tips endograft in main portal vein. Therefore, mechanical thrombectomy was performed through the tips endograft and main portal vein into the central superior mesenteric vein with a 6 [place] cleaner device undergoing multiple passes. Repeat portal venogram demonstrated improved patency and inline flow via the indwelling tips, however with multiple irregular luminal filling defects and sluggish flow. Retrograde flow was noted into the left gastric vein which filled multiple small gastroesophageal varices. The indwelling tips endograft was then dilated with a 10 mm x 80 mm Athletis balloon. Repeat portal venogram demonstrated improved patency and antegrade flow through the indwelling endograft. There is persistent retrograde filling of the left gastric vein. Therefore, a 5 French C2 catheter was  used to select the left gastric vein. Left gastric venogram demonstrated retrograde flow with opacification and irregular vascularity about multifocal small gastroesophageal varices. Therefore, coil embolization was performed about the central left gastric  vein with 2, 6 mm 0.035 "[place] detachable coils. Completion portal venogram demonstrated successfully embolized left gastric vein with antegrade flow via patent main portal vein in newly placed TIPS endograft, however several scattered filling defects remain in the main portal vein and in the endograft. The catheters and sheath were removed and manual compression was applied to the right internal jugular and right greater saphenous venous access sites until hemostasis was achieved. The patient was administered 60 mg of subcutaneous Lovenox. The patient was transferred to the PACU in stable condition. IMPRESSION: 1. 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 2. 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 3. Technically successful aspiration and mechanical thrombectomy of the superior mesenteric and main portal veins. 4. Technically successful plain and drug coated balloon angioplasty of the superior mesenteric and main portal veins. 5. Technically successful coil embolization of the left gastric vein. PLAN: Begin therapeutic Lovenox for 1 month prior to transitioning back to oral anticoagulation. IR will follow while inpatient and arrange outpatient followup within 1 month after discharge. Marliss Coots, MD [facility] Radiology Electronically Signed   By: Marliss Coots M.D.   On: 02/09/2023 22:10   CT ABDOMEN PELVIS W CONTRAST Result Date: 02/06/2023 CLINICAL DATA:  Pancreatitis, acute, severe Patient presents with abdominal pain. Radiologic records indicates history of pancreatic cancer.  EXAM: CT ABDOMEN AND PELVIS WITH CONTRAST TECHNIQUE: Multidetector CT imaging of the abdomen and pelvis was performed using the standard protocol following bolus administration of intravenous contrast. RADIATION DOSE REDUCTION: This exam was performed according to the departmental dose-optimization program which includes automated exposure control, adjustment of the mA and/or kV according to patient size and/or use of iterative reconstruction technique. CONTRAST:  75mL OMNIPAQUE IOHEXOL 350 MG/ML SOLN COMPARISON:  CT 8 days ago.  PET CT 10/28/2022 reviewed FINDINGS: Lower chest: Again seen linear atelectasis or scarring in the left greater than right lower lobe. 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 No pleural effusion. Hepatobiliary: Tiny hypodensity in the right lobe of the liver is unchanged series 3, image 9. No new intrahepatic abnormality. There is progressive portal vein thrombus, increasing volume of intrahepatic portal vein thrombus. The gallbladder is not well seen. Common bile duct is poorly defined on the current exam. Pancreas: Pancreatic atrophy with ductal dilatation, unchanged over the last 8 days. The known hypodense pancreatic mass is grossly unchanged measuring 2.2 cm series 3, image 23. This is less well-defined than on prior exam. Lesion abuts the SMV with progressive SMV thrombus. Peripancreatic fat stranding about the pancreatic head is minor. Spleen: Bilobed appearance of the spleen. No focal splenic abnormality. Adrenals/Urinary Tract: No adrenal nodule. Bilateral parapelvic cysts. No further follow-up imaging is recommended. No hydronephrosis or renal inflammation. Moderate bladder distension, no wall thickening. Stomach/Bowel: Detailed bowel assessment is limited in the absence of enteric contrast. Paraesophageal varices. Wall thickening about the distal stomach, reference series 3, image 21. Again seen wall thickening of the duodenum with adjacent stranding, for example  series 3, image 25. No small bowel obstruction or additional small bowel wall thickening. No small bowel pneumatosis. There is wall thickening about the hepatic flexure of the colon series 3, image 26, increased from prior exam. Transverse colon is redundant. Small to moderate colonic stool burden. Vascular/Lymphatic: Progressive thrombus within the superior mesenteric vein which is now occlusive. Progressive thrombus within the main and intrahepatic portal veins. The splenic vein remains patent. Increasing  portosystemic collaterals. Aortic atherosclerosis. No bulky adenopathy. Reproductive: Nonacute. Other: Right upper quadrant fat stranding which is adjacent to gastric, duodenal and colonic wall thickening. No frank ascites. No free air. Right lateral lumbar hernia contains only fat. Musculoskeletal: Posterior rod with intrapedicular screw fusion L2 through L5. Additional multilevel degenerative change in the spine. No evidence of focal bone lesion. IMPRESSION: 1. Progressive thrombus within the superior mesenteric vein over the last 8 days, now occlusive. Progressive thrombus within the main and intrahepatic portal veins. 2. Known hypodense pancreatic mass is grossly unchanged measuring 2.2 cm. This is less well-defined than on prior exam. 3. Peripancreatic fat stranding is mild. 4. Persistent and progressive wall thickening about the distal stomach, duodenum, and hepatic flexure of the colon. Progressive right upper quadrant fat stranding. Etiology of bowel wall thickening and inflammation is indeterminate. 5. Increasing portosystemic collaterals. Paraesophageal varices. Aortic Atherosclerosis (ICD10-I70.0). Electronically Signed   By: Narda Rutherford M.D.   On: 02/06/2023 17:21   ECHOCARDIOGRAM COMPLETE Result Date: 02/01/2023    ECHOCARDIOGRAM REPORT   Patient Name:   TRAMIYA ZELLAR Date of Exam: 02/01/2023 Medical Rec #:  [id number]         Height:       62.5 in Accession #:    [id number]        Weight:        132.0 lb Date of Birth:  07-05-1945        BSA:          1.612 m Patient Age:    77 years          BP:           133/76 mmHg Patient Gender: F                 HR:           73 bpm. Exam Location:  Inpatient Procedure: 2D Echo, Color Doppler and Cardiac Doppler Indications:    Pre-op exam (TIPS)  History:        Patient has prior history of Echocardiogram examinations, most                 recent 03/18/2014. Risk Factors:Hypertension.  Sonographer:    Irving Burton Senior RDCS Referring Phys: [id number] Lynann Bologna HAN IMPRESSIONS  1. Left ventricular ejection fraction, by estimation, is 55 to 60%. The left ventricle has normal function. The left ventricle has no regional wall motion abnormalities. Left ventricular diastolic parameters are consistent with Grade I diastolic dysfunction (impaired relaxation).  2. Right ventricular systolic function is normal. The right ventricular size is normal. Tricuspid regurgitation signal is inadequate for assessing PA pressure.  3. A small pericardial effusion is present. The pericardial effusion is anterior to the right ventricle. There is no evidence of cardiac tamponade.  4. The mitral valve is normal in structure. Trivial mitral valve regurgitation. No evidence of mitral stenosis.  5. The aortic valve is tricuspid. There is mild calcification of the aortic valve. Aortic valve regurgitation is trivial. Aortic valve sclerosis is present, with no evidence of aortic valve stenosis.  6. The inferior vena cava is normal in size with greater than 50% respiratory variability, suggesting right atrial pressure of 3 mmHg. FINDINGS  Left Ventricle: Left ventricular ejection fraction, by estimation, is 55 to 60%. The left ventricle has normal function. The left ventricle has no regional wall motion abnormalities. The left ventricular internal cavity size was normal in size. There is  no left ventricular hypertrophy. Left ventricular  diastolic parameters are consistent with Grade I diastolic  dysfunction (impaired relaxation). Right Ventricle: The right ventricular size is normal. No increase in right ventricular wall thickness. Right ventricular systolic function is normal. Tricuspid regurgitation signal is inadequate for assessing PA pressure. Left Atrium: Left atrial size was normal in size. Right Atrium: Right atrial size was normal in size. Pericardium: A small pericardial effusion is present. The pericardial effusion is anterior to the right ventricle. There is no evidence of cardiac tamponade. Presence of epicardial fat layer. Mitral Valve: The mitral valve is normal in structure. Trivial mitral valve regurgitation. No evidence of mitral valve stenosis. Tricuspid Valve: The tricuspid valve is normal in structure. Tricuspid valve regurgitation is trivial. No evidence of tricuspid stenosis. Aortic Valve: The aortic valve is tricuspid. There is mild calcification of the aortic valve. Aortic valve regurgitation is trivial. Aortic valve sclerosis is present, with no evidence of aortic valve stenosis. Pulmonic Valve: The pulmonic valve was normal in structure. Pulmonic valve regurgitation is trivial. No evidence of pulmonic stenosis. Aorta: The aortic root and ascending aorta are structurally normal, with no evidence of dilitation. Venous: The inferior vena cava is normal in size with greater than 50% respiratory variability, suggesting right atrial pressure of 3 mmHg. IAS/Shunts: The atrial septum is grossly normal.  LEFT VENTRICLE PLAX 2D LVIDd:         4.10 cm   Diastology LVIDs:         2.90 cm   LV e' medial:    4.68 cm/s LV PW:         0.80 cm   LV E/e' medial:  12.0 LV IVS:        0.70 cm   LV e' lateral:   9.46 cm/s LVOT diam:     2.00 cm   LV E/e' lateral: 5.9 LV SV:         47 LV SV Index:   29 LVOT Area:     3.14 cm  RIGHT VENTRICLE RV S prime:     12.80 cm/s TAPSE (M-mode): 1.9 cm LEFT ATRIUM             Index        RIGHT ATRIUM           Index LA diam:        2.60 cm 1.61 cm/m   RA Area:      11.70 cm LA Vol (A2C):   33.8 ml 20.97 ml/m  RA Volume:   24.50 ml  15.20 ml/m LA Vol (A4C):   35.7 ml 22.15 ml/m LA Biplane Vol: 36.7 ml 22.77 ml/m  AORTIC VALVE LVOT Vmax:   69.80 cm/s LVOT Vmean:  52.400 cm/s LVOT VTI:    0.151 m  AORTA Ao Root diam: 3.10 cm Ao Asc diam:  3.20 cm MITRAL VALVE MV Area (PHT): 2.27 cm    SHUNTS MV E velocity: 56.10 cm/s  Systemic VTI:  0.15 m MV A velocity: 79.70 cm/s  Systemic Diam: 2.00 cm MV E/A ratio:  0.70 Lennie Odor MD Electronically signed by Lennie Odor MD Signature Date/Time: 02/01/2023/10:35:33 AM    Final    CT ABDOMEN PELVIS W CONTRAST Result Date: 01/29/2023 CLINICAL DATA:  Dark red rectal bleeding since last Thursday. Pancreatic cancer. * Tracking Code: BO * EXAM: CT ABDOMEN AND PELVIS WITH CONTRAST TECHNIQUE: Multidetector CT imaging of the abdomen and pelvis was performed using the standard protocol following bolus administration of intravenous contrast. RADIATION DOSE REDUCTION: This exam was performed  according to the departmental dose-optimization program which includes automated exposure control, adjustment of the mA and/or kV according to patient size and/or use of iterative reconstruction technique. CONTRAST:  OMNIPAQUE IOHEXOL 300 MG/ML  SOLN COMPARISON:  CTA 01/12/2023. Older standard CT scans as well including September 2024. PET-CT 10/28/2022. MRI 10/18/2022. FINDINGS: Lower chest: Linear opacity lung bases likely scar or atelectasis. No pleural effusion. Hepatobiliary: Tiny right-sided dome hepatic cystic focus, unchanged from previous, nonspecific. 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 Pancreas: Severe pancreatic atrophy with ductal dilatation. There is transition point along a low-density mass seen along the head and uncinate process. This mass on series 2, image 27 measures 2.3 by 1.7 cm and is more  defined today and larger than previous examinations. This has abuts the SMV. There is some progressive thrombus within the upper SMV which is nonocclusive. Preserved tissue plane to the celiac and SMA. See coronal image 32 of series 4. Spleen: Slightly lobular spleen which is nonenlarged. Adrenals/Urinary Tract: Prominent bilateral parapelvic renal cysts bilaterally. Small parenchymal cysts. No enhancing mass or collecting system dilatation. Preserved contours of the urinary bladder. Stomach/Bowel: Once again there is significant wall thickening and edema along the antrum and pylorus of the stomach. Please correlate for gastritis. There is some increasing adjacent stranding. Stomach is nondilated. Small bowel is nondilated. Large bowel has a normal course and caliber with some scattered colonic stool. There is slight wall thickening along portion of the hepatic flexure in the porta hepatis with some adjacent increasing fluid. Normal appendix seen in the right lower quadrant. Vascular/Lymphatic: Aortic atherosclerosis. No enlarged abdominal or pelvic lymph nodes. Reproductive: Uterus and bilateral adnexa are unremarkable. Other: No free intra-air. Musculoskeletal: Streak artifact related to the significant hardware along the lumbar spine. Other areas of advanced degenerative changes along the thoracolumbar region. Degenerative changes of the pelvis. Critical Value/emergent results were called by telephone at the time of interpretation on 01/29/2023 at 1:45 pm to provider [facility] , who verbally acknowledged these results. 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 Again there is a low-density masslike area along the uncinate process with associated severe ductal dilatation in caliber change. This has consistent with patient's  history of pancreatic neoplasm. No developing lymph node enlargement or new liver lesion at this time. Progressive wall thickening along the distal stomach towards the antrum and pylorus with the edema. Please correlate for peptic ulcer disease or other process. Increasing adjacent stranding and trace fluid extending towards the porta hepatis. Electronically Signed   By: Karen Kays M.D.   On: 01/29/2023 13:53   CT ANGIO GI BLEED Result Date: 01/12/2023 CLINICAL DATA:  Abdominal pain, melena, history of pancreatic cancer * Tracking Code: BO * EXAM: CTA ABDOMEN AND PELVIS WITHOUT AND WITH CONTRAST TECHNIQUE: Multidetector CT imaging of the abdomen and pelvis was performed using the standard protocol during bolus administration of intravenous contrast. Multiplanar reconstructed images and MIPs were obtained and reviewed to evaluate the vascular anatomy. RADIATION DOSE REDUCTION: This exam was performed according to the departmental dose-optimization program which includes automated exposure control, adjustment of the mA and/or kV according to patient size and/or use of iterative reconstruction technique. CONTRAST:  75mL OMNIPAQUE  IOHEXOL 350 MG/ML SOLN COMPARISON:  PET-CT, 10/28/2022, CT abdomen pelvis, 09/30/2022 FINDINGS: VASCULAR Normal contour and caliber of the abdominal aorta. No evidence of aneurysm, dissection, or other acute aortic pathology. Standard branching pattern of the abdominal aorta with solitary bilateral renal arteries. Moderate mixed calcific atherosclerosis. Review of the MIP images confirms the above findings. NON-VASCULAR Lower Chest: No acute findings.  Coronary artery calcifications. Hepatobiliary: No solid liver abnormality is seen. Unchanged subcentimeter low-attenuation no gallstones, gallbladder wall thickening, or biliary dilatation. Pancreas: Unchanged post treatment appearance of the pancreatic head (series 15, image 25). Severe ductal dilatation and pancreatic atrophy distal to  the pancreatic head, duct again measuring up to 1.1 cm in caliber (series 15, image 21). Spleen: Normal in size without significant abnormality. Adrenals/Urinary Tract: Adrenal glands are unremarkable. Numerous benign bilateral parapelvic renal cysts, for which no further follow-up or characterization is required. Kidneys are otherwise normal, without renal calculi, solid lesion, or hydronephrosis. Bladder is unremarkable. Stomach/Bowel: Thickening of the gastric antrum and pylorus (series 15, image 21). Status post appendectomy. No evidence of bowel wall thickening, distention, or inflammatory changes. Lymphatic: No enlarged abdominal or pelvic lymph nodes. Reproductive: No mass or other significant abnormality. Other: No abdominal wall hernia or abnormality. No ascites. Musculoskeletal: No acute osseous findings. IMPRESSION: 1. Thickening of the gastric antrum and pylorus, suggestive of gastritis; as on prior examinations this is possibly secondary to radiation therapy. 2. No intraluminal contrast extravasation or other findings to specifically localize reported GI bleeding. 3. Normal contour and caliber of the abdominal aorta. No evidence of aneurysm, dissection, or other acute aortic pathology. Moderate atherosclerosis. 4. Unchanged post treatment appearance of the pancreatic head. Severe ductal dilatation and pancreatic atrophy distal to the pancreatic head. 5. Coronary artery disease. Aortic Atherosclerosis (ICD10-I70.0). Electronically Signed   By: Jearld Lesch M.D.   On: 01/12/2023 14:43   Addendum I have seen the patient, examined her. I agree with the assessment and and plan and have edited the notes.   Pt underwent TIPs creation, aspiration and mechanical portal thrombectomy, main portal vein and SMV drug coated balloon angioplasty and embolization of left gastric vein by IR yesterday.  She will placed on Lovenox 60 mg twice daily after procedure.  She developed procedure related abdominal pain last  night, but her pain has improved today.  She has not had a bowel movement since her procedure, her hemoglobin slightly dropped at this morning.,  She was given 2 units of blood today.  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  I would monitor her hemoglobin twice daily, if she develops overt GI bleeding, Lovenox should be held then. Will also discuss with IR Dr. Shuttle to see if lovenox can be changed to lower dose. Can also consider off label use bevacizumab after discharge for her GI bleeding issue. Regarding her pancreatic cancer, I would consider single agent chemotherapy such as gemcitabine after hospital discharge, if her overall condition stabilized and performance status improved.   Her overall prognosis is certainly poor, she is also very frustrated. This is her 4th hospital admission for GI bleeding. However I do not feel we should recommend hospice at this point, unless pt and her family wants. I will talk to her son tomorrow.   I spent a total of 50 minutes for her visit today, more than 50% time on face-to-face counseling.  Malachy Mood MD 02/10/2023

## 2023-02-10 NOTE — Progress Notes (Signed)
PROGRESS NOTE                                                                                                                                                                                                             Patient Demographics:    Sheryl Bender, is a 78 y.o. female, DOB - [birth year], GNF:[id number]  Outpatient Primary MD for the patient is [name], DO    LOS - 4  Admit date - 02/06/2023    Chief Complaint  Patient presents with   Abdominal Pain   Weakness       Brief Narrative (HPI from H&P)   78 y.o. female with medical history significant of Stage 1 pancreatic cancer s/p chemoradiation, pulmonary embolism on Eliquis, iron-deficiency anemia, interstitial pancreatitis, HTN, recurrent GI bleed who presents with melena and weakness.    Patient hospitalized from 12/2-12/5 with GI bleed requiring transfusion. 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  Colonoscopy with no active bleeding, minimal amount of old blood in the cecum, 6 mm polyp removed from mid sigmoid colon.    She then resumed Eliquis on 12/11 and was re-admitted from 12/18-12/21 with recurrent GI bleed requiring transfusion. EGD found to have hemorrhagic gastritis, treated with APC, bipolar cautery.  GAVE is also possibility.  GI recommended PPI twice daily for 4 weeks then daily indefinitely. Asked to resume Eliquis on 12/28.   Hospitalized again 1/4 -1/8 for GI bleed. EGD Gastritis with hemorrhage. Treated with argon plasma coagulation (APC). Non-bleeding gastric ulcers with no stigmata of bleeding. Duodenitis. CT abdomen and pelvis with contrast-question of the nonocclusive thrombus in the SMV but most significant thrombus not seen in the portal vein including the main portal vein. low-density masslike area along the uncinate process with associated severe ductal dilatation in  caliber change. Plan was for IR thrombectomy on 1/21 and to hold any systemic anticoagulation.   She now presented to the ER on 02/06/2023 for reoccurrence of GI bleed.   Subjective:   Patient in bed, appears comfortable, denies any headache, no fever, no chest pain or pressure, no shortness of breath ,+ve epigastric abdominal pain. No new focal weakness.   Assessment  & Plan :    Recurrent upper GI bleed fourth episode in the last 4 weeks, bleeding likely being  caused by.  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  Seen by GI underwent EGD on 02/07/2023 by Dr. Leonides Schanz, found bleeding GAVE in the antrum treated with APC, also several Forrest class III ulcers in gastric antrum.  On PPI, Carafate, liquid diet, GI on board.  By IR underwent TIPS procedure, aspiration of portal vein thrombus, SMV balloon angioplasty along with embolization of left gastric vein by IR on 02/09/2023.  Has been placed on full anticoagulation with Lovenox by IR postprocedure on 02/09/2023.  Has some postprocedure pain.  Seen by IR again on 02/10/2023.  GI and oncology will see her again on 02/10/2023 and appoint.  Patient has had issues with recurrent GI bleed as above, post TIPS procedure on 02/09/2023 H&H has dropped some, some blood loss can be expected from the TIPS procedure itself, type screen and monitor, if needed will transfuse.  If bleeding continues to recur in the next 3 to 4 days despite supportive care then we will have goals of care discussion again.    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 CT abdomen shows progressive thrombus in SMV into portal vein.  ?clot vs tumor.  IR planning thrombectomy along with TIPS procedure on 02/09/2023, have involved oncology and palliative care along with GI this admission to provide long-term  prognosis and goals of care.  Patient's pancreatic cancer was being treated at [facility] by  [name], was currently being monitored off of chemotherapy for the last 3 months, she also went to [facility] for a Whipple's procedure but it could not be done due to the location of her tumor.  Unfortunately CA 19.9 is now trending up and currently quite high, case discussed with [name] oncologist on 02/09/2023, also discussed her case with her primary oncologist [name] on 02/10/2023.  Plan is to monitor with supportive care, if she stabilizes then discharge her home with outpatient follow-up with her oncologist in 1 week, if bleeding reoccurs in the next 2 to 3 days then readdress goals of care.  Malignant neoplasm of pancreas (HCC) CA19-9 has increased from normal 4 months ago to 220 this week.  Kindly see above  Essential hypertension BP normal, diet controlled  Depression - Continue bupropion, Buspar, Lexapro and mirtazapine       Condition - Extremely Guarded  Family Communication  : Discussed with patient's son [name] [phone]  on 02/08/2023 at 10:15 AM, called son on 02/10/2023 at 10:20 AM and message left  Code Status : Full code  Consults  : GI, IR, oncology, palliative care  PUD Prophylaxis : PPI   Procedures  :     CT - 1. Progressive thrombus within the superior mesenteric vein over the last 8 days, now occlusive. Progressive thrombus within the main and intrahepatic portal veins. 2. Known hypodense pancreatic mass is grossly unchanged measuring 2.2 cm. This is less well-defined than on prior exam. 3. Peripancreatic fat stranding is mild. 4. Persistent and progressive wall thickening about the distal stomach, duodenum, and hepatic flexure of the colon. Progressive right upper quadrant fat stranding. Etiology of bowel wall thickening and inflammation is indeterminate. 5. Increasing portosystemic collaterals. Paraesophageal varices. Aortic Atherosclerosis  (ICD10-I70.0)      Disposition Plan  :    Status is: Inpatient   DVT Prophylaxis  :    SCDs Start: 02/06/23  1929   Lab Results  Component Value Date   PLT 171 02/10/2023    Diet :  Diet Order             Diet clear liquid Room service appropriate? Yes; Fluid consistency: Thin  Diet effective now                    Inpatient Medications  Scheduled Meds:  buPROPion  300 mg Oral QPC breakfast   busPIRone  20 mg Oral BID   Chlorhexidine Gluconate Cloth  6 each Topical Daily   cholecalciferol  5,000 Units Oral q morning   enoxaparin (LOVENOX) injection  1 mg/kg Subcutaneous Q12H   escitalopram  20 mg Oral QHS   linaclotide  72 mcg Oral QAC breakfast   mirtazapine  15 mg Oral QHS   pantoprazole (PROTONIX) IV  40 mg Intravenous Q12H   potassium chloride  40 mEq Oral Once   QUEtiapine  25 mg Oral BID   sodium chloride flush  10-40 mL Intracatheter Q12H   sodium chloride flush  10-40 mL Intracatheter Q12H   sucralfate  1 g Oral TID WC & HS   Continuous Infusions:   PRN Meds:.HYDROcodone-acetaminophen, morphine injection, sodium chloride  Antibiotics  :    Anti-infectives (From admission, onward)    Start     Dose/Rate Route Frequency Ordered Stop   02/09/23 1045  cefTRIAXone (ROCEPHIN) 2 g in sodium chloride 0.9 % 100 mL IVPB        2 g 200 mL/hr over 30 Minutes Intravenous To Radiology 02/08/23 1525 02/09/23 1159         Objective:   Vitals:   02/09/23 2300 02/10/23 0223 02/10/23 0300 02/10/23 0800  BP:  126/69 107/62 120/66  Pulse: (!) 107 (!) 104 (!) 103 99  Resp: (!) 21 13 18 13   Temp: 98.2 F (36.8 C)  98 F (36.7 C) 99.3 F (37.4 C)  TempSrc: Oral  Oral Oral  SpO2: 97% 98% 97% 97%  Weight:      Height:        Wt Readings from Last 3 Encounters:  02/09/23 59 kg  02/03/23 58.6 kg  01/29/23 59.9 kg     Intake/Output Summary (Last 24 hours) at 02/10/2023 1019 Last data filed at 02/09/2023 1600 Gross per 24 hour  Intake 1850 ml   Output 825 ml  Net 1025 ml     Physical Exam  Awake Alert, No new F.N deficits, Normal affect Adrian.AT,PERRAL Supple Neck, No JVD,   Symmetrical Chest wall movement, Good air movement bilaterally, CTAB RRR,No Gallops,Rubs or new Murmurs,  +ve B.Sounds, Abd Soft, No tenderness,   No Cyanosis, Clubbing or edema       Data Review:    Recent Labs  Lab 02/03/23 1552 02/06/23 1214 02/07/23 0208 02/08/23 0329 02/09/23 0404 02/10/23 0040  WBC 4.1 4.1 3.7* 3.5* 3.7* 8.2  HGB 10.4* 9.2* 8.4* 8.0* 8.1* 7.1*  HCT 33.5* 29.5* 26.7* 25.3* 26.1* 22.4*  PLT 208 168 147* 133* 157 171  MCV 95 95.5 95.7 93.7 94.2 95.7  MCH 29.5 29.8 30.1 29.6 29.2 30.3  MCHC 31.0* 31.2 31.5 31.6 31.0 31.7  RDW 15.2 16.5* 16.4* 16.2* 16.3* 16.7*  LYMPHSABS 0.9  --   --   --  0.6* 0.6*  MONOABS  --   --   --   --  0.6 0.8  EOSABS 0.0  --   --   --  0.1 0.0  BASOSABS  0.0  --   --   --  0.0 0.0    Recent Labs  Lab 02/06/23 1214 02/07/23 0208 02/08/23 0329 02/09/23 0404 02/10/23 0040  NA 140 139 140 141 140  K 3.8 3.6 3.6 3.6 3.5  CL 111 111 112* 111 108  CO2 21* 20* 23 22 22   ANIONGAP 8 8 5 8 10   GLUCOSE 155* 114* 136* 109* 130*  BUN 24* 17 9 9 14   CREATININE 0.82 0.74 0.73 0.84 0.93  AST 34  --  23 22 91*  ALT 40  --  28 27 82*  ALKPHOS 110  --  89 100 89  BILITOT 0.4  --  0.6 0.6 0.7  ALBUMIN 3.0*  --  2.5* 2.6* 2.7*  INR  --   --   --  1.1  --   BNP  --   --   --  16.5 57.1  MG  --   --   --  2.1 2.1  CALCIUM 9.0 8.5* 8.4* 8.5* 8.0*      Recent Labs  Lab 02/06/23 1214 02/07/23 0208 02/08/23 0329 02/09/23 0404 02/10/23 0040  INR  --   --   --  1.1  --   BNP  --   --   --  16.5 57.1  MG  --   --   --  2.1 2.1  CALCIUM 9.0 8.5* 8.4* 8.5* 8.0*    --------------------------------------------------------------------------------------------------------------- No results found for: "CHOL", "HDL", "LDLCALC", "LDLDIRECT", "TRIG", "CHOLHDL"  No results found for: "HGBA1C" No  results for input(s): "TSH", "T4TOTAL", "FREET4", "T3FREE", "THYROIDAB" in the last 72 hours. No results for input(s): "VITAMINB12", "FOLATE", "FERRITIN", "TIBC", "IRON", "RETICCTPCT" in the last 72 hours. ------------------------------------------------------------------------------------------------------------------ Cardiac Enzymes No results for input(s): "CKMB", "TROPONINI", "MYOGLOBIN" in the last 168 hours.  Invalid input(s): "CK"  Micro Results No results found for this or any previous visit (from the past 240 hours).  Radiology Reports IR Tips Result Date: 02/09/2023 CLINICAL DATA:  78 year old female with history of pancreatic cancer status post radiation with development of progressive acute portal vein thrombus and recurrent gastric hemorrhage. EXAM: 1. Ultrasound-guided access of the right internal jugular vein 2. Ultrasound-guided access of the right greater saphenous vein 3. Hepatic venogram 4. Intravascular ultrasound 5. Catheterization of the portal vein 6. Portal venogram 7. Creation of a transhepatic portal vein to hepatic vein shunt 8. Aspiration thrombectomy of the portal vein and superior mesenteric vein 9. Mechanical thrombectomy of the portal vein and superior mesenteric vein 10. Drug coated balloon angioplasty of the main portal vein and superior mesenteric vein 11. Embolization of left gastric vein MEDICATIONS: As antibiotic prophylaxis, Rocephin 1 gm IV was ordered pre-procedure and administered intravenously within one hour of incision. ANESTHESIA/SEDATION: General - as administered by the Anesthesia department CONTRAST:  One hundred ML Omnipaque 300, intravenous FLUOROSCOPY TIME:  2,580 mGy COMPLICATIONS: None immediate. PROCEDURE: The procedure was performed in concert with my partner Dr. Katherina Right. Informed written consent was obtained from the patient after a thorough discussion of the procedural risks, benefits and alternatives. All questions were addressed. Maximal  Sterile Barrier Technique was utilized including caps, mask, sterile gowns, sterile gloves, sterile drape, hand hygiene and skin antiseptic. A timeout was performed prior to the initiation of the procedure. A preliminary ultrasound of the right groin was performed and demonstrates a patent right common femoral vein and central greater saphenous vein. A permanent ultrasound image was recorded. Using a combination of fluoroscopy and ultrasound, an access  site was determined. A small dermatotomy was made at the planned puncture site. Using ultrasound guidance, access into the right greater saphenous vein was obtained with visualization of needle entry into the vessel using a standard micropuncture technique. A wire was advanced into the IVC insert all fascial dilation performed. An 8 [place], 11 cm vascular sheath was placed into the external iliac vein. Through this access site, an 18 [place] ICE catheter was advanced with ease under fluoroscopic guidance to the level of the intrahepatic inferior vena cava. A preliminary ultrasound of the right neck was performed and demonstrates a patent internal jugular vein. A permanent ultrasound image was recorded. Using a combination of fluoroscopy and ultrasound, an access site was determined. A small dermatotomy was made at the planned puncture site. Using ultrasound guidance, access into the right internal jugular vein was obtained with visualization of needle entry into the vessel using a standard micropuncture technique. A wire was advanced into the IVC and serial fascial dilation performed. A 10 French tips sheath was placed into the internal jugular vein and advanced to the IVC. The jugular sheath was retracted into the right atrium and manometry was performed measuring a mean pressure of 11 mmHg. A 5 French angled tip catheter was then directed into the right hepatic vein. Hepatic venogram was performed. These images demonstrated patent hepatic vein with no  stenosis. The catheter was advanced to a wedge portion of the a patent vein over which the 10 French sheath was advanced into the right hepatic vein. Using ICE ultrasound visualization the catheter as right hepatic vein as well as the portal anatomy was defined. There was acute appearing, mildly expansile heterogeneously hypoechoic thrombus throughout the portal veins. A planned exit site from the hepatic vein and puncture site from the portal vein was placed into a single sonographic plane. Under direct ultrasound visualization, the ScorpionX needle was advanced into the central right portal vein. A Glidewire Advantage was then advanced however coursed laterally in appeared to follow and extrahepatic location. Upon manipulating to preserve portal venous access, hepatic vein access was lost with the base sheath. Therefore, the jugular sheath was then retracted into the right atrium and a 5 French angled tip catheter was directed into the middle hepatic vein. Hepatic venogram was performed. These images demonstrated patent hepatic vein with no stenosis. The catheter was advanced to a wedged portion of the patent vein over which the 10 French sheath was advanced into the middle hepatic vein. Using ICE ultrasound visualization a planned exit site from the hepatic vein and portal puncture site were placed into a single sonographic plane. Under direct ultrasound visualization, the ScorpionX needle was advanced into the central left portal vein. There is moderate redundancy in looping of the wire in the left portal vein prior to entering the main portal vein which required several manipulation techniques to reduce including balloon dilation and insertion of a stiff buddy wire. A 5 French marking pigtail catheter was then advanced over the wire into the superior mesenteric vein and wire removed. Portal venogram was performed which demonstrated occlusive thrombus throughout the central superior mesenteric vein and main  portal vein. The tract was then dilated to 8 mm with an 8 mm x 8 cm Athletis balloon. A 8-10 mm by 7 + 2 cm of Viatorr endograft was placed. This was dilated to 8 mm. The indwelling 10 French sheath was then exchanged for a 16 French, 33 cm dry seal sheath. The sheath was directed to the portal  aspect of the endograft. Aspiration thrombectomy was then performed in multiple passes over the wire through the main portal vein and into the central superior mesenteric vein. Moderate acute and chronic appearing thrombus were collected in the aspiration canister. A sample was sent for pathology. Repeat portal venogram demonstrated persistent occlusive appearing thrombus in the main portal vein and irregular luminal filling defects in the central superior mesenteric vein. Therefore, balloon angioplasty was performed with a 6 mm x 8 cm Athletis balloon. Repeat portal venogram demonstrated no evidence of extravasation in mildly restored luminal gain in the central superior mesenteric vein. This region was then treated with a 6 mm x 8 cm In.Pact drug coated balloon prolonged inflation for total of 3 minutes. After angioplasty, the central superior mesenteric vein appear patent with inline flow into the indwelling tips endograft. Additional balloon angioplasty through the tips endograft within 8 mm balloon was performed. Repeat portal venogram demonstrated minimal improved patency. Additional aspiration thrombectomy was performed through the tips endograft which yielded moderate acute appearing thrombus. Repeat portal venogram demonstrated persistent near occlusive thrombus throughout the indwelling tips endograft in main portal vein. Therefore, mechanical thrombectomy was performed through the tips endograft and main portal vein into the central superior mesenteric vein with a 6 [place] cleaner device undergoing multiple passes. Repeat portal venogram demonstrated improved patency and inline flow via the indwelling tips, however  with multiple irregular luminal filling defects and sluggish flow. Retrograde flow was noted into the left gastric vein which filled multiple small gastroesophageal varices. The indwelling tips endograft was then dilated with a 10 mm x 80 mm Athletis balloon. Repeat portal venogram demonstrated improved patency and antegrade flow through the indwelling endograft. There is persistent retrograde filling of the left gastric vein. Therefore, a 5 French C2 catheter was used to select the left gastric vein. Left gastric venogram demonstrated retrograde flow with opacification and irregular vascularity about multifocal small gastroesophageal varices. Therefore, coil embolization was performed about the central left gastric vein with 2, 6 mm 0.035 "Azur detachable coils. Completion portal venogram demonstrated successfully embolized left gastric vein with antegrade flow via patent main portal vein in newly placed TIPS endograft, however several scattered filling defects remain in the main portal vein and in the endograft. The catheters and sheath were removed and manual compression was applied to the right internal jugular and right greater saphenous venous access sites until hemostasis was achieved. The patient was administered 60 mg of subcutaneous Lovenox. The patient was transferred to the PACU in stable condition. IMPRESSION: 1. 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 2. 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 3. Technically successful aspiration and mechanical thrombectomy of the superior mesenteric and main portal veins. 4. Technically successful plain and drug coated balloon angioplasty of the superior mesenteric and main portal veins. 5. Technically successful coil embolization of the left gastric vein. PLAN: Begin therapeutic Lovenox for 1 month prior to transitioning back to oral  anticoagulation. IR will follow while inpatient and arrange outpatient followup within 1 month after discharge. Marliss Coots, MD [facility] Radiology Electronically Signed   By: Marliss Coots M.D.   On: 02/09/2023 22:10   IR US Guide Vasc Access Right Result Date: 02/09/2023 CLINICAL DATA:  78 year old female with history of pancreatic cancer status post radiation with development of progressive acute portal vein thrombus and recurrent gastric hemorrhage. EXAM: 1. Ultrasound-guided access of the right internal jugular  vein 2. Ultrasound-guided access of the right greater saphenous vein 3. Hepatic venogram 4. Intravascular ultrasound 5. Catheterization of the portal vein 6. Portal venogram 7. Creation of a transhepatic portal vein to hepatic vein shunt 8. Aspiration thrombectomy of the portal vein and superior mesenteric vein 9. Mechanical thrombectomy of the portal vein and superior mesenteric vein 10. Drug coated balloon angioplasty of the main portal vein and superior mesenteric vein 11. Embolization of left gastric vein MEDICATIONS: As antibiotic prophylaxis, Rocephin 1 gm IV was ordered pre-procedure and administered intravenously within one hour of incision. ANESTHESIA/SEDATION: General - as administered by the Anesthesia department CONTRAST:  One hundred ML Omnipaque 300, intravenous FLUOROSCOPY TIME:  2,580 mGy COMPLICATIONS: None immediate. PROCEDURE: The procedure was performed in concert with my partner Dr. Katherina Right. Informed written consent was obtained from the patient after a thorough discussion of the procedural risks, benefits and alternatives. All questions were addressed. Maximal Sterile Barrier Technique was utilized including caps, mask, sterile gowns, sterile gloves, sterile drape, hand hygiene and skin antiseptic. A timeout was performed prior to the initiation of the procedure. A preliminary ultrasound of the right groin was performed and  demonstrates a patent right common femoral vein and central greater saphenous vein. A permanent ultrasound image was recorded. Using a combination of fluoroscopy and ultrasound, an access site was determined. A small dermatotomy was made at the planned puncture site. Using ultrasound guidance, access into the right greater saphenous vein was obtained with visualization of needle entry into the vessel using a standard micropuncture technique. A wire was advanced into the IVC insert all fascial dilation performed. An 8 [place], 11 cm vascular sheath was placed into the external iliac vein. Through this access site, an 58 [place] ICE catheter was advanced with ease under fluoroscopic guidance to the level of the intrahepatic inferior vena cava. A preliminary ultrasound of the right neck was performed and demonstrates a patent internal jugular vein. A permanent ultrasound image was recorded. Using a combination of fluoroscopy and ultrasound, an access site was determined. A small dermatotomy was made at the planned puncture site. Using ultrasound guidance, access into the right internal jugular vein was obtained with visualization of needle entry into the vessel using a standard micropuncture technique. A wire was advanced into the IVC and serial fascial dilation performed. A 10 French tips sheath was placed into the internal jugular vein and advanced to the IVC. The jugular sheath was retracted into the right atrium and manometry was performed measuring a mean pressure of 11 mmHg. A 5 French angled tip catheter was then directed into the right hepatic vein. Hepatic venogram was performed. These images demonstrated patent hepatic vein with no stenosis. The catheter was advanced to a wedge portion of the a patent vein over which the 10 French sheath was advanced into the right hepatic vein. Using ICE ultrasound visualization the catheter as right hepatic vein as well as the portal anatomy was defined. There was  acute appearing, mildly expansile heterogeneously hypoechoic thrombus throughout the portal veins. A planned exit site from the hepatic vein and puncture site from the portal vein was placed into a single sonographic plane. Under direct ultrasound visualization, the ScorpionX needle was advanced into the central right portal vein. A Glidewire Advantage was then advanced however coursed laterally in appeared to follow and extrahepatic location. Upon manipulating to preserve portal venous access, hepatic vein access was lost with the base sheath. Therefore, the jugular sheath was then retracted into the right atrium  and a 5 French angled tip catheter was directed into the middle hepatic vein. Hepatic venogram was performed. These images demonstrated patent hepatic vein with no stenosis. The catheter was advanced to a wedged portion of the patent vein over which the 10 French sheath was advanced into the middle hepatic vein. Using ICE ultrasound visualization a planned exit site from the hepatic vein and portal puncture site were placed into a single sonographic plane. Under direct ultrasound visualization, the ScorpionX needle was advanced into the central left portal vein. There is moderate redundancy in looping of the wire in the left portal vein prior to entering the main portal vein which required several manipulation techniques to reduce including balloon dilation and insertion of a stiff buddy wire. A 5 French marking pigtail catheter was then advanced over the wire into the superior mesenteric vein and wire removed. Portal venogram was performed which demonstrated occlusive thrombus throughout the central superior mesenteric vein and main portal vein. The tract was then dilated to 8 mm with an 8 mm x 8 cm Athletis balloon. A 8-10 mm by 7 + 2 cm of Viatorr endograft was placed. This was dilated to 8 mm. The indwelling 10 French sheath was then exchanged for a 16 French, 33 cm dry seal sheath. The sheath was  directed to the portal aspect of the endograft. Aspiration thrombectomy was then performed in multiple passes over the wire through the main portal vein and into the central superior mesenteric vein. Moderate acute and chronic appearing thrombus were collected in the aspiration canister. A sample was sent for pathology. Repeat portal venogram demonstrated persistent occlusive appearing thrombus in the main portal vein and irregular luminal filling defects in the central superior mesenteric vein. Therefore, balloon angioplasty was performed with a 6 mm x 8 cm Athletis balloon. Repeat portal venogram demonstrated no evidence of extravasation in mildly restored luminal gain in the central superior mesenteric vein. This region was then treated with a 6 mm x 8 cm In.Pact drug coated balloon prolonged inflation for total of 3 minutes. After angioplasty, the central superior mesenteric vein appear patent with inline flow into the indwelling tips endograft. Additional balloon angioplasty through the tips endograft within 8 mm balloon was performed. Repeat portal venogram demonstrated minimal improved patency. Additional aspiration thrombectomy was performed through the tips endograft which yielded moderate acute appearing thrombus. Repeat portal venogram demonstrated persistent near occlusive thrombus throughout the indwelling tips endograft in main portal vein. Therefore, mechanical thrombectomy was performed through the tips endograft and main portal vein into the central superior mesenteric vein with a 6 [place] cleaner device undergoing multiple passes. Repeat portal venogram demonstrated improved patency and inline flow via the indwelling tips, however with multiple irregular luminal filling defects and sluggish flow. Retrograde flow was noted into the left gastric vein which filled multiple small gastroesophageal varices. The indwelling tips endograft was then dilated with a 10 mm x 80 mm Athletis balloon. Repeat  portal venogram demonstrated improved patency and antegrade flow through the indwelling endograft. There is persistent retrograde filling of the left gastric vein. Therefore, a 5 French C2 catheter was used to select the left gastric vein. Left gastric venogram demonstrated retrograde flow with opacification and irregular vascularity about multifocal small gastroesophageal varices. Therefore, coil embolization was performed about the central left gastric vein with 2, 6 mm 0.035 "Azur detachable coils. Completion portal venogram demonstrated successfully embolized left gastric vein with antegrade flow via patent main portal vein in newly placed TIPS endograft, however several  scattered filling defects remain in the main portal vein and in the endograft. The catheters and sheath were removed and manual compression was applied to the right internal jugular and right greater saphenous venous access sites until hemostasis was achieved. The patient was administered 60 mg of subcutaneous Lovenox. The patient was transferred to the PACU in stable condition. IMPRESSION: 1. 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 2. 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 3. Technically successful aspiration and mechanical thrombectomy of the superior mesenteric and main portal veins. 4. Technically successful plain and drug coated balloon angioplasty of the superior mesenteric and main portal veins. 5. Technically successful coil embolization of the left gastric vein. PLAN: Begin therapeutic Lovenox for 1 month prior to transitioning back to oral anticoagulation. IR will follow while inpatient and arrange outpatient followup within 1 month after discharge. Marliss Coots, MD [facility] Radiology Electronically Signed   By: Marliss Coots M.D.   On: 02/09/2023 22:10    IR US Guide Vasc Access Right Result Date: 02/09/2023 CLINICAL DATA:  78 year old female with history of pancreatic cancer status post radiation with development of progressive acute portal vein thrombus and recurrent gastric hemorrhage. EXAM: 1. Ultrasound-guided access of the right internal jugular vein 2. Ultrasound-guided access of the right greater saphenous vein 3. Hepatic venogram 4. Intravascular ultrasound 5. Catheterization of the portal vein 6. Portal venogram 7. Creation of a transhepatic portal vein to hepatic vein shunt 8. Aspiration thrombectomy of the portal vein and superior mesenteric vein 9. Mechanical thrombectomy of the portal vein and superior mesenteric vein 10. Drug coated balloon angioplasty of the main portal vein and superior mesenteric vein 11. Embolization of left gastric vein MEDICATIONS: As antibiotic prophylaxis, Rocephin 1 gm IV was ordered pre-procedure and administered intravenously within one hour of incision. ANESTHESIA/SEDATION: General - as administered by the Anesthesia department CONTRAST:  One hundred ML Omnipaque 300, intravenous FLUOROSCOPY TIME:  2,580 mGy COMPLICATIONS: None immediate. PROCEDURE: The procedure was performed in concert with my partner Dr. Katherina Right. Informed written consent was obtained from the patient after a thorough discussion of the procedural risks, benefits and alternatives. All questions were addressed. Maximal Sterile Barrier Technique was utilized including caps, mask, sterile gowns, sterile gloves, sterile drape, hand hygiene and skin antiseptic. A timeout was performed prior to the initiation of the procedure. A preliminary ultrasound of the right groin was performed and demonstrates a patent right common femoral vein and central greater saphenous vein. A permanent ultrasound image was recorded. Using a combination of fluoroscopy and ultrasound, an access site was determined. A small dermatotomy was made at the planned puncture site.  Using ultrasound guidance, access into the right greater saphenous vein was obtained with visualization of needle entry into the vessel using a standard micropuncture technique. A wire was advanced into the IVC insert all fascial dilation performed. An 8 [place], 11 cm vascular sheath was placed into the external iliac vein. Through this access site, an 78 [place] ICE catheter was advanced with ease under fluoroscopic guidance to the level of the intrahepatic inferior vena cava. A preliminary ultrasound of the right neck was performed and demonstrates a patent internal jugular vein. A permanent ultrasound image was recorded. Using a combination of fluoroscopy and ultrasound, an access site was determined. A small dermatotomy was made at the planned puncture site. Using ultrasound guidance, access into the right internal jugular vein was obtained  with visualization of needle entry into the vessel using a standard micropuncture technique. A wire was advanced into the IVC and serial fascial dilation performed. A 10 French tips sheath was placed into the internal jugular vein and advanced to the IVC. The jugular sheath was retracted into the right atrium and manometry was performed measuring a mean pressure of 11 mmHg. A 5 French angled tip catheter was then directed into the right hepatic vein. Hepatic venogram was performed. These images demonstrated patent hepatic vein with no stenosis. The catheter was advanced to a wedge portion of the a patent vein over which the 10 French sheath was advanced into the right hepatic vein. Using ICE ultrasound visualization the catheter as right hepatic vein as well as the portal anatomy was defined. There was acute appearing, mildly expansile heterogeneously hypoechoic thrombus throughout the portal veins. A planned exit site from the hepatic vein and puncture site from the portal vein was placed into a single sonographic plane. Under direct ultrasound visualization, the  ScorpionX needle was advanced into the central right portal vein. A Glidewire Advantage was then advanced however coursed laterally in appeared to follow and extrahepatic location. Upon manipulating to preserve portal venous access, hepatic vein access was lost with the base sheath. Therefore, the jugular sheath was then retracted into the right atrium and a 5 French angled tip catheter was directed into the middle hepatic vein. Hepatic venogram was performed. These images demonstrated patent hepatic vein with no stenosis. The catheter was advanced to a wedged portion of the patent vein over which the 10 French sheath was advanced into the middle hepatic vein. Using ICE ultrasound visualization a planned exit site from the hepatic vein and portal puncture site were placed into a single sonographic plane. Under direct ultrasound visualization, the ScorpionX needle was advanced into the central left portal vein. There is moderate redundancy in looping of the wire in the left portal vein prior to entering the main portal vein which required several manipulation techniques to reduce including balloon dilation and insertion of a stiff buddy wire. A 5 French marking pigtail catheter was then advanced over the wire into the superior mesenteric vein and wire removed. Portal venogram was performed which demonstrated occlusive thrombus throughout the central superior mesenteric vein and main portal vein. The tract was then dilated to 8 mm with an 8 mm x 8 cm Athletis balloon. A 8-10 mm by 7 + 2 cm of Viatorr endograft was placed. This was dilated to 8 mm. The indwelling 10 French sheath was then exchanged for a 16 French, 33 cm dry seal sheath. The sheath was directed to the portal aspect of the endograft. Aspiration thrombectomy was then performed in multiple passes over the wire through the main portal vein and into the central superior mesenteric vein. Moderate acute and chronic appearing thrombus were collected in the  aspiration canister. A sample was sent for pathology. Repeat portal venogram demonstrated persistent occlusive appearing thrombus in the main portal vein and irregular luminal filling defects in the central superior mesenteric vein. Therefore, balloon angioplasty was performed with a 6 mm x 8 cm Athletis balloon. Repeat portal venogram demonstrated no evidence of extravasation in mildly restored luminal gain in the central superior mesenteric vein. This region was then treated with a 6 mm x 8 cm In.Pact drug coated balloon prolonged inflation for total of 3 minutes. After angioplasty, the central superior mesenteric vein appear patent with inline flow into the indwelling tips endograft. Additional balloon angioplasty through  the tips endograft within 8 mm balloon was performed. Repeat portal venogram demonstrated minimal improved patency. Additional aspiration thrombectomy was performed through the tips endograft which yielded moderate acute appearing thrombus. Repeat portal venogram demonstrated persistent near occlusive thrombus throughout the indwelling tips endograft in main portal vein. Therefore, mechanical thrombectomy was performed through the tips endograft and main portal vein into the central superior mesenteric vein with a 6 [place] cleaner device undergoing multiple passes. Repeat portal venogram demonstrated improved patency and inline flow via the indwelling tips, however with multiple irregular luminal filling defects and sluggish flow. Retrograde flow was noted into the left gastric vein which filled multiple small gastroesophageal varices. The indwelling tips endograft was then dilated with a 10 mm x 80 mm Athletis balloon. Repeat portal venogram demonstrated improved patency and antegrade flow through the indwelling endograft. There is persistent retrograde filling of the left gastric vein. Therefore, a 5 French C2 catheter was used to select the left gastric vein. Left gastric venogram  demonstrated retrograde flow with opacification and irregular vascularity about multifocal small gastroesophageal varices. Therefore, coil embolization was performed about the central left gastric vein with 2, 6 mm 0.035 "Azur detachable coils. Completion portal venogram demonstrated successfully embolized left gastric vein with antegrade flow via patent main portal vein in newly placed TIPS endograft, however several scattered filling defects remain in the main portal vein and in the endograft. The catheters and sheath were removed and manual compression was applied to the right internal jugular and right greater saphenous venous access sites until hemostasis was achieved. The patient was administered 60 mg of subcutaneous Lovenox. The patient was transferred to the PACU in stable condition. IMPRESSION: 1. 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 2. 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 3. Technically successful aspiration and mechanical thrombectomy of the superior mesenteric and main portal veins. 4. Technically successful plain and drug coated balloon angioplasty of the superior mesenteric and main portal veins. 5. Technically successful coil embolization of the left gastric vein. PLAN: Begin therapeutic Lovenox for 1 month prior to transitioning back to oral anticoagulation. IR will follow while inpatient and arrange outpatient followup within 1 month after discharge. Marliss Coots, MD [facility] Radiology Electronically Signed   By: Marliss Coots M.D.   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 EXAM: 1.  Ultrasound-guided access of the right internal jugular vein 2. Ultrasound-guided access of the right greater saphenous vein 3. Hepatic venogram 4. Intravascular ultrasound 5. Catheterization of the portal vein 6. Portal venogram 7. Creation of a transhepatic portal vein to hepatic vein shunt 8. Aspiration thrombectomy of the portal vein and superior mesenteric vein 9. Mechanical thrombectomy of the portal vein and superior mesenteric vein 10. Drug coated balloon angioplasty of the main portal vein and superior mesenteric vein 11. Embolization of left gastric vein MEDICATIONS: As antibiotic prophylaxis, Rocephin 1 gm IV was ordered pre-procedure and administered intravenously within one hour of incision. ANESTHESIA/SEDATION: General - as administered by the Anesthesia department CONTRAST:  One hundred ML Omnipaque 300, intravenous FLUOROSCOPY TIME:  2,580 mGy COMPLICATIONS: None immediate. PROCEDURE: The procedure was performed in concert with my partner Dr. Katherina Right. Informed written consent was obtained from the patient after a thorough  discussion of the procedural risks, benefits and alternatives. All questions were addressed. Maximal Sterile Barrier Technique was utilized including caps, mask, sterile gowns, sterile gloves, sterile drape, hand hygiene and skin antiseptic. A timeout was performed prior to the initiation of the procedure. A preliminary ultrasound of the right groin was performed and demonstrates a patent right common femoral vein and central greater saphenous vein. A permanent ultrasound image was recorded. Using a combination of fluoroscopy and ultrasound, an access site was determined. A small dermatotomy was made at the planned puncture site. Using ultrasound guidance, access into the right greater saphenous vein was obtained with visualization of needle entry into the vessel using a standard micropuncture technique. A wire was advanced into the IVC insert all fascial dilation performed. An 8  [place], 11 cm vascular sheath was placed into the external iliac vein. Through this access site, an 76 [place] ICE catheter was advanced with ease under fluoroscopic guidance to the level of the intrahepatic inferior vena cava. A preliminary ultrasound of the right neck was performed and demonstrates a patent internal jugular vein. A permanent ultrasound image was recorded. Using a combination of fluoroscopy and ultrasound, an access site was determined. A small dermatotomy was made at the planned puncture site. Using ultrasound guidance, access into the right internal jugular vein was obtained with visualization of needle entry into the vessel using a standard micropuncture technique. A wire was advanced into the IVC and serial fascial dilation performed. A 10 French tips sheath was placed into the internal jugular vein and advanced to the IVC. The jugular sheath was retracted into the right atrium and manometry was performed measuring a mean pressure of 11 mmHg. A 5 French angled tip catheter was then directed into the right hepatic vein. Hepatic venogram was performed. These images demonstrated patent hepatic vein with no stenosis. The catheter was advanced to a wedge portion of the a patent vein over which the 10 French sheath was advanced into the right hepatic vein. Using ICE ultrasound visualization the catheter as right hepatic vein as well as the portal anatomy was defined. There was acute appearing, mildly expansile heterogeneously hypoechoic thrombus throughout the portal veins. A planned exit site from the hepatic vein and puncture site from the portal vein was placed into a single sonographic plane. Under direct ultrasound visualization, the ScorpionX needle was advanced into the central right portal vein. A Glidewire Advantage was then advanced however coursed laterally in appeared to follow and extrahepatic location. Upon manipulating to preserve portal venous access, hepatic vein access was  lost with the base sheath. Therefore, the jugular sheath was then retracted into the right atrium and a 5 French angled tip catheter was directed into the middle hepatic vein. Hepatic venogram was performed. These images demonstrated patent hepatic vein with no stenosis. The catheter was advanced to a wedged portion of the patent vein over which the 10 French sheath was advanced into the middle hepatic vein. Using ICE ultrasound visualization a planned exit site from the hepatic vein and portal puncture site were placed into a single sonographic plane. Under direct ultrasound visualization, the ScorpionX needle was advanced into the central left portal vein. There is moderate redundancy in looping of the wire in the left portal vein prior to entering the main portal vein which required several manipulation techniques to reduce including balloon dilation and insertion of a stiff buddy wire. A 5 French marking pigtail catheter was then advanced over the wire into the superior mesenteric vein and  wire removed. Portal venogram was performed which demonstrated occlusive thrombus throughout the central superior mesenteric vein and main portal vein. The tract was then dilated to 8 mm with an 8 mm x 8 cm Athletis balloon. A 8-10 mm by 7 + 2 cm of Viatorr endograft was placed. This was dilated to 8 mm. The indwelling 10 French sheath was then exchanged for a 16 French, 33 cm dry seal sheath. The sheath was directed to the portal aspect of the endograft. Aspiration thrombectomy was then performed in multiple passes over the wire through the main portal vein and into the central superior mesenteric vein. Moderate acute and chronic appearing thrombus were collected in the aspiration canister. A sample was sent for pathology. Repeat portal venogram demonstrated persistent occlusive appearing thrombus in the main portal vein and irregular luminal filling defects in the central superior mesenteric vein. Therefore, balloon  angioplasty was performed with a 6 mm x 8 cm Athletis balloon. Repeat portal venogram demonstrated no evidence of extravasation in mildly restored luminal gain in the central superior mesenteric vein. This region was then treated with a 6 mm x 8 cm In.Pact drug coated balloon prolonged inflation for total of 3 minutes. After angioplasty, the central superior mesenteric vein appear patent with inline flow into the indwelling tips endograft. Additional balloon angioplasty through the tips endograft within 8 mm balloon was performed. Repeat portal venogram demonstrated minimal improved patency. Additional aspiration thrombectomy was performed through the tips endograft which yielded moderate acute appearing thrombus. Repeat portal venogram demonstrated persistent near occlusive thrombus throughout the indwelling tips endograft in main portal vein. Therefore, mechanical thrombectomy was performed through the tips endograft and main portal vein into the central superior mesenteric vein with a 6 [place] cleaner device undergoing multiple passes. Repeat portal venogram demonstrated improved patency and inline flow via the indwelling tips, however with multiple irregular luminal filling defects and sluggish flow. Retrograde flow was noted into the left gastric vein which filled multiple small gastroesophageal varices. The indwelling tips endograft was then dilated with a 10 mm x 80 mm Athletis balloon. Repeat portal venogram demonstrated improved patency and antegrade flow through the indwelling endograft. There is persistent retrograde filling of the left gastric vein. Therefore, a 5 French C2 catheter was used to select the left gastric vein. Left gastric venogram demonstrated retrograde flow with opacification and irregular vascularity about multifocal small gastroesophageal varices. Therefore, coil embolization was performed about the central left gastric vein with 2, 6 mm 0.035 "Azur detachable coils. Completion portal  venogram demonstrated successfully embolized left gastric vein with antegrade flow via patent main portal vein in newly placed TIPS endograft, however several scattered filling defects remain in the main portal vein and in the endograft. The catheters and sheath were removed and manual compression was applied to the right internal jugular and right greater saphenous venous access sites until hemostasis was achieved. The patient was administered 60 mg of subcutaneous Lovenox. The patient was transferred to the PACU in stable condition. IMPRESSION: 1. 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 2. 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 3. Technically successful aspiration and mechanical thrombectomy of the superior mesenteric and main portal veins. 4. Technically successful plain and drug coated balloon angioplasty of the superior mesenteric and main portal veins. 5. Technically successful coil embolization of the left gastric vein. PLAN: Begin therapeutic Lovenox for 1 month prior to transitioning back to oral anticoagulation. IR will follow  while inpatient and arrange outpatient followup within 1 month after discharge. Marliss Coots, MD [facility] Radiology Electronically Signed   By: Marliss Coots M.D.   On: 02/09/2023 22:10   IR EMBO VENOUS NOT HEMORR HEMANG  INC GUIDE ROADMAPPING Result Date: 02/09/2023 CLINICAL DATA:  78 year old female with history of pancreatic cancer status post radiation with development of progressive acute portal vein thrombus and recurrent gastric hemorrhage. EXAM: 1. Ultrasound-guided access of the right internal jugular vein 2. Ultrasound-guided access of the right greater saphenous vein 3. Hepatic venogram 4. Intravascular ultrasound 5. Catheterization of the portal vein 6. Portal venogram 7. Creation of a  transhepatic portal vein to hepatic vein shunt 8. Aspiration thrombectomy of the portal vein and superior mesenteric vein 9. Mechanical thrombectomy of the portal vein and superior mesenteric vein 10. Drug coated balloon angioplasty of the main portal vein and superior mesenteric vein 11. Embolization of left gastric vein MEDICATIONS: As antibiotic prophylaxis, Rocephin 1 gm IV was ordered pre-procedure and administered intravenously within one hour of incision. ANESTHESIA/SEDATION: General - as administered by the Anesthesia department CONTRAST:  One hundred ML Omnipaque 300, intravenous FLUOROSCOPY TIME:  2,580 mGy COMPLICATIONS: None immediate. PROCEDURE: The procedure was performed in concert with my partner Dr. Katherina Right. Informed written consent was obtained from the patient after a thorough discussion of the procedural risks, benefits and alternatives. All questions were addressed. Maximal Sterile Barrier Technique was utilized including caps, mask, sterile gowns, sterile gloves, sterile drape, hand hygiene and skin antiseptic. A timeout was performed prior to the initiation of the procedure. A preliminary ultrasound of the right groin was performed and demonstrates a patent right common femoral vein and central greater saphenous vein. A permanent ultrasound image was recorded. Using a combination of fluoroscopy and ultrasound, an access site was determined. A small dermatotomy was made at the planned puncture site. Using ultrasound guidance, access into the right greater saphenous vein was obtained with visualization of needle entry into the vessel using a standard micropuncture technique. A wire was advanced into the IVC insert all fascial dilation performed. An 8 [place], 11 cm vascular sheath was placed into the external iliac vein. Through this access site, an 49 [place] ICE catheter was advanced with ease under fluoroscopic guidance to the level of the intrahepatic inferior vena cava. A preliminary  ultrasound of the right neck was performed and demonstrates a patent internal jugular vein. A permanent ultrasound image was recorded. Using a combination of fluoroscopy and ultrasound, an access site was determined. A small dermatotomy was made at the planned puncture site. Using ultrasound guidance, access into the right internal jugular vein was obtained with visualization of needle entry into the vessel using a standard micropuncture technique. A wire was advanced into the IVC and serial fascial dilation performed. A 10 French tips sheath was placed into the internal jugular vein and advanced to the IVC. The jugular sheath was retracted into the right atrium and manometry was performed measuring a mean pressure of 11 mmHg. A 5 French angled tip catheter was then directed into the right hepatic vein. Hepatic venogram was performed. These images demonstrated patent hepatic vein with no stenosis. The catheter was advanced to a wedge portion of the a patent vein over which the 10 French sheath was advanced into the right hepatic vein. Using ICE ultrasound visualization the catheter as right hepatic vein as well as the portal anatomy was defined. There was acute appearing, mildly expansile heterogeneously hypoechoic thrombus  throughout the portal veins. A planned exit site from the hepatic vein and puncture site from the portal vein was placed into a single sonographic plane. Under direct ultrasound visualization, the ScorpionX needle was advanced into the central right portal vein. A Glidewire Advantage was then advanced however coursed laterally in appeared to follow and extrahepatic location. Upon manipulating to preserve portal venous access, hepatic vein access was lost with the base sheath. Therefore, the jugular sheath was then retracted into the right atrium and a 5 French angled tip catheter was directed into the middle hepatic vein. Hepatic venogram was performed. These images demonstrated patent hepatic  vein with no stenosis. The catheter was advanced to a wedged portion of the patent vein over which the 10 French sheath was advanced into the middle hepatic vein. Using ICE ultrasound visualization a planned exit site from the hepatic vein and portal puncture site were placed into a single sonographic plane. Under direct ultrasound visualization, the ScorpionX needle was advanced into the central left portal vein. There is moderate redundancy in looping of the wire in the left portal vein prior to entering the main portal vein which required several manipulation techniques to reduce including balloon dilation and insertion of a stiff buddy wire. A 5 French marking pigtail catheter was then advanced over the wire into the superior mesenteric vein and wire removed. Portal venogram was performed which demonstrated occlusive thrombus throughout the central superior mesenteric vein and main portal vein. The tract was then dilated to 8 mm with an 8 mm x 8 cm Athletis balloon. A 8-10 mm by 7 + 2 cm of Viatorr endograft was placed. This was dilated to 8 mm. The indwelling 10 French sheath was then exchanged for a 16 French, 33 cm dry seal sheath. The sheath was directed to the portal aspect of the endograft. Aspiration thrombectomy was then performed in multiple passes over the wire through the main portal vein and into the central superior mesenteric vein. Moderate acute and chronic appearing thrombus were collected in the aspiration canister. A sample was sent for pathology. Repeat portal venogram demonstrated persistent occlusive appearing thrombus in the main portal vein and irregular luminal filling defects in the central superior mesenteric vein. Therefore, balloon angioplasty was performed with a 6 mm x 8 cm Athletis balloon. Repeat portal venogram demonstrated no evidence of extravasation in mildly restored luminal gain in the central superior mesenteric vein. This region was then treated with a 6 mm x 8 cm In.Pact  drug coated balloon prolonged inflation for total of 3 minutes. After angioplasty, the central superior mesenteric vein appear patent with inline flow into the indwelling tips endograft. Additional balloon angioplasty through the tips endograft within 8 mm balloon was performed. Repeat portal venogram demonstrated minimal improved patency. Additional aspiration thrombectomy was performed through the tips endograft which yielded moderate acute appearing thrombus. Repeat portal venogram demonstrated persistent near occlusive thrombus throughout the indwelling tips endograft in main portal vein. Therefore, mechanical thrombectomy was performed through the tips endograft and main portal vein into the central superior mesenteric vein with a 6 [place] cleaner device undergoing multiple passes. Repeat portal venogram demonstrated improved patency and inline flow via the indwelling tips, however with multiple irregular luminal filling defects and sluggish flow. Retrograde flow was noted into the left gastric vein which filled multiple small gastroesophageal varices. The indwelling tips endograft was then dilated with a 10 mm x 80 mm Athletis balloon. Repeat portal venogram demonstrated improved patency and antegrade flow through the indwelling  endograft. There is persistent retrograde filling of the left gastric vein. Therefore, a 5 French C2 catheter was used to select the left gastric vein. Left gastric venogram demonstrated retrograde flow with opacification and irregular vascularity about multifocal small gastroesophageal varices. Therefore, coil embolization was performed about the central left gastric vein with 2, 6 mm 0.035 "Azur detachable coils. Completion portal venogram demonstrated successfully embolized left gastric vein with antegrade flow via patent main portal vein in newly placed TIPS endograft, however several scattered filling defects remain in the main portal vein and in the endograft. The catheters and  sheath were removed and manual compression was applied to the right internal jugular and right greater saphenous venous access sites until hemostasis was achieved. The patient was administered 60 mg of subcutaneous Lovenox. The patient was transferred to the PACU in stable condition. IMPRESSION: 1. 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 2. 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 3. Technically successful aspiration and mechanical thrombectomy of the superior mesenteric and main portal veins. 4. Technically successful plain and drug coated balloon angioplasty of the superior mesenteric and main portal veins. 5. Technically successful coil embolization of the left gastric vein. PLAN: Begin therapeutic Lovenox for 1 month prior to transitioning back to oral anticoagulation. IR will follow while inpatient and arrange outpatient followup within 1 month after discharge. Marliss Coots, MD [facility] Electronically Signed   By: Marliss Coots M.D.   On: 02/09/2023 22:10   CT ABDOMEN PELVIS W CONTRAST Result Date: 02/06/2023 CLINICAL DATA:  Pancreatitis, acute, severe Patient presents with abdominal pain. Radiologic records indicates history of pancreatic cancer. EXAM: CT ABDOMEN AND PELVIS WITH CONTRAST TECHNIQUE: Multidetector CT imaging of the abdomen and pelvis was performed using the standard protocol following bolus administration of intravenous contrast. RADIATION DOSE REDUCTION: This exam was performed according to the departmental dose-optimization program which includes automated exposure control, adjustment of the mA and/or kV according to patient size and/or use of iterative reconstruction technique. CONTRAST:  75mL OMNIPAQUE IOHEXOL 350 MG/ML SOLN COMPARISON:  CT 8 days ago.  PET CT 10/28/2022 reviewed FINDINGS: Lower  chest: Again seen linear atelectasis or scarring in the left greater than right lower lobe. 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 No pleural effusion. Hepatobiliary: Tiny hypodensity in the right lobe of the liver is unchanged series 3, image 9. No new intrahepatic abnormality. There is progressive portal vein thrombus, increasing volume of intrahepatic portal vein thrombus. The gallbladder is not well seen. Common bile duct is poorly defined on the current exam. Pancreas: Pancreatic atrophy with ductal dilatation, unchanged over the last 8 days. The known hypodense pancreatic mass is grossly unchanged measuring 2.2 cm series 3, image 23. This is less well-defined than on prior exam. Lesion abuts the SMV with progressive SMV thrombus. Peripancreatic fat stranding about the pancreatic head is minor. Spleen: Bilobed appearance of the spleen. No focal splenic abnormality. Adrenals/Urinary Tract: No adrenal nodule. Bilateral parapelvic cysts. No further follow-up imaging is recommended. No hydronephrosis or renal inflammation. Moderate bladder distension, no wall thickening. Stomach/Bowel: Detailed bowel assessment is limited in the absence of enteric contrast. Paraesophageal varices. Wall thickening about the distal stomach, reference series 3, image 21. Again seen wall thickening of the duodenum with adjacent stranding, for example series 3, image 25. No small bowel obstruction or additional small bowel wall thickening. No small bowel pneumatosis. There is wall thickening about  the hepatic flexure of the colon series 3, image 26, increased from prior exam. Transverse colon is redundant. Small to moderate colonic stool burden. Vascular/Lymphatic: Progressive thrombus within the superior mesenteric vein which is now occlusive. Progressive thrombus within the main and intrahepatic portal veins. The splenic vein remains patent. Increasing portosystemic collaterals. Aortic atherosclerosis. No bulky adenopathy.  Reproductive: Nonacute. Other: Right upper quadrant fat stranding which is adjacent to gastric, duodenal and colonic wall thickening. No frank ascites. No free air. Right lateral lumbar hernia contains only fat. Musculoskeletal: Posterior rod with intrapedicular screw fusion L2 through L5. Additional multilevel degenerative change in the spine. No evidence of focal bone lesion. IMPRESSION: 1. Progressive thrombus within the superior mesenteric vein over the last 8 days, now occlusive. Progressive thrombus within the main and intrahepatic portal veins. 2. Known hypodense pancreatic mass is grossly unchanged measuring 2.2 cm. This is less well-defined than on prior exam. 3. Peripancreatic fat stranding is mild. 4. Persistent and progressive wall thickening about the distal stomach, duodenum, and hepatic flexure of the colon. Progressive right upper quadrant fat stranding. Etiology of bowel wall thickening and inflammation is indeterminate. 5. Increasing portosystemic collaterals. Paraesophageal varices. Aortic Atherosclerosis (ICD10-I70.0). Electronically Signed   By: Narda Rutherford M.D.   On: 02/06/2023 17:21      Signature  -   Susa Raring M.D on 02/10/2023 at 10:19 AM   -  To page go to [email or web address]

## 2023-02-10 NOTE — Progress Notes (Signed)
Referring Physician(s): Dr. 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 S/p TIPS creation, aspiration and mechanical portal thrombectomy, main portal vein and SMV drug coated balloon angioplasty and embolization of left gastric vein.   Subjective: Patient resting in bed with her eyes closed. She is drowsy but able to answer questions appropriately. She is complaining of gas pains/belching.   Allergies: Other, Cherry, and Wound dressing adhesive  Medications: Prior to Admission medications   Medication Sig Start Date End Date Taking? Authorizing Provider  acetaminophen (TYLENOL) 500 MG tablet Take 2 tablets (1,000 mg total) by mouth every 8 (eight) hours as needed for moderate pain, headache or fever. Patient taking differently: Take 750-1,000 mg by mouth every 8 (eight) hours as needed for moderate pain (pain score 4-6), headache or fever. 07/21/21  Yes Vassie Loll, MD  buPROPion (WELLBUTRIN XL) 300 MG 24 hr tablet Take 300 mg by mouth daily after breakfast. Takes differently from prescribed: 20mg  BID 06/15/21  Yes [provider]  busPIRone (BUSPAR) 10 MG tablet Take 20 mg by mouth 2 (two) times daily. 01/27/21  Yes [provider]  Cholecalciferol (VITAMIN D3) 125 MCG (5000 UT) CAPS Take 5,000 Units by mouth every morning.   Yes [provider]  escitalopram (LEXAPRO) 20 MG tablet Take 20 mg by mouth at bedtime.   Yes [provider]  lactose free nutrition (BOOST) LIQD Take 237 mLs by mouth 2 (two) times daily between meals.   Yes [provider]  LINZESS 72 MCG capsule TAKE 1 CAPSULE BY MOUTH DAILY BEFORE BREAKFAST Patient taking differently: Take 72 mcg by mouth daily before breakfast. 01/20/23  Yes Mansouraty, Netty Starring., MD  mirtazapine (REMERON) 30 MG tablet Take 15 mg by mouth at bedtime.   Yes  [provider]  pantoprazole (PROTONIX) 40 MG tablet Take 1 tablet (40 mg total) by mouth 2 (two) times daily. 02/02/23 05/03/23 Yes Carollee Herter, DO  QUEtiapine (SEROQUEL) 25 MG tablet Take 25 mg by mouth 2 (two) times daily. 12/14/22  Yes [provider]  lidocaine-prilocaine (EMLA) cream Apply a small amount to port a cath site (do not rub in) and cover with plastic wrap 1 hour prior to infusion appointments 09/15/21   Doreatha Massed, MD  ondansetron (ZOFRAN) 4 MG tablet Take 1 tablet (4 mg total) by mouth every 6 (six) hours as needed for nausea or vomiting. 02/02/23 03/04/23  Carollee Herter, DO     Vital Signs: BP 118/62 (BP Location: Right Arm)   Pulse (!) 113   Temp 98.7 F (37.1 C) (Axillary)   Resp 19   Ht 5' 2.5" (1.588 m)   Wt 130 lb (59 kg)   SpO2 97%   BMI 23.40 kg/m   Physical Exam Constitutional:      General: She is not in acute distress. Cardiovascular:     Pulses: Normal pulses.     Comments: RIJ and right femoral vascular access sites are clean, soft, dry and non-tender.  Pulmonary:     Effort: Pulmonary effort is normal.  Abdominal:     Tenderness: There is abdominal tenderness.     Comments: Generalized abdominal tenderness.   Neurological:     Mental Status: She is oriented to person, place, and time.     Imaging: IR Tips Result Date: 02/09/2023 CLINICAL DATA:  78 year old female with history of pancreatic cancer status post  radiation with development of progressive acute portal vein thrombus and recurrent gastric hemorrhage. EXAM: 1. Ultrasound-guided access of the right internal jugular vein 2. Ultrasound-guided access of the right greater saphenous vein 3. Hepatic venogram 4. Intravascular ultrasound 5. Catheterization of the portal vein 6. Portal venogram 7. Creation of a transhepatic portal vein to hepatic vein shunt 8. Aspiration thrombectomy of the portal vein and superior mesenteric vein 9. Mechanical thrombectomy of the portal vein and  superior mesenteric vein 10. Drug coated balloon angioplasty of the main portal vein and superior mesenteric vein 11. Embolization of left gastric vein MEDICATIONS: As antibiotic prophylaxis, Rocephin 1 gm IV was ordered pre-procedure and administered intravenously within one hour of incision. ANESTHESIA/SEDATION: General - as administered by the Anesthesia department CONTRAST:  One hundred ML Omnipaque 300, intravenous FLUOROSCOPY TIME:  2,580 mGy COMPLICATIONS: None immediate. PROCEDURE: The procedure was performed in concert with my partner Dr. Katherina Right. Informed written consent was obtained from the patient after a thorough discussion of the procedural risks, benefits and alternatives. All questions were addressed. Maximal Sterile Barrier Technique was utilized including caps, mask, sterile gowns, sterile gloves, sterile drape, hand hygiene and skin antiseptic. A timeout was performed prior to the initiation of the procedure. A preliminary ultrasound of the right groin was performed and demonstrates a patent right common femoral vein and central greater saphenous vein. A permanent ultrasound image was recorded. Using a combination of fluoroscopy and ultrasound, an access site was determined. A small dermatotomy was made at the planned puncture site. Using ultrasound guidance, access into the right greater saphenous vein was obtained with visualization of needle entry into the vessel using a standard micropuncture technique. A wire was advanced into the IVC insert all fascial dilation performed. An 8 [place], 11 cm vascular sheath was placed into the external iliac vein. Through this access site, an 73 [place] ICE catheter was advanced with ease under fluoroscopic guidance to the level of the intrahepatic inferior vena cava. A preliminary ultrasound of the right neck was performed and demonstrates a patent internal jugular vein. A permanent ultrasound image was recorded. Using a combination of fluoroscopy  and ultrasound, an access site was determined. A small dermatotomy was made at the planned puncture site. Using ultrasound guidance, access into the right internal jugular vein was obtained with visualization of needle entry into the vessel using a standard micropuncture technique. A wire was advanced into the IVC and serial fascial dilation performed. A 10 French tips sheath was placed into the internal jugular vein and advanced to the IVC. The jugular sheath was retracted into the right atrium and manometry was performed measuring a mean pressure of 11 mmHg. A 5 French angled tip catheter was then directed into the right hepatic vein. Hepatic venogram was performed. These images demonstrated patent hepatic vein with no stenosis. The catheter was advanced to a wedge portion of the a patent vein over which the 10 French sheath was advanced into the right hepatic vein. Using ICE ultrasound visualization the catheter as right hepatic vein as well as the portal anatomy was defined. There was acute appearing, mildly expansile heterogeneously hypoechoic thrombus throughout the portal veins. A planned exit site from the hepatic vein and puncture site from the portal vein was placed into a single sonographic plane. Under direct ultrasound visualization, the ScorpionX needle was advanced into the central right portal vein. A Glidewire Advantage was then advanced however coursed laterally in appeared to follow and extrahepatic location. Upon manipulating to preserve portal  venous access, hepatic vein access was lost with the base sheath. Therefore, the jugular sheath was then retracted into the right atrium and a 5 French angled tip catheter was directed into the middle hepatic vein. Hepatic venogram was performed. These images demonstrated patent hepatic vein with no stenosis. The catheter was advanced to a wedged portion of the patent vein over which the 10 French sheath was advanced into the middle hepatic vein. Using ICE  ultrasound visualization a planned exit site from the hepatic vein and portal puncture site were placed into a single sonographic plane. Under direct ultrasound visualization, the ScorpionX needle was advanced into the central left portal vein. There is moderate redundancy in looping of the wire in the left portal vein prior to entering the main portal vein which required several manipulation techniques to reduce including balloon dilation and insertion of a stiff buddy wire. A 5 French marking pigtail catheter was then advanced over the wire into the superior mesenteric vein and wire removed. Portal venogram was performed which demonstrated occlusive thrombus throughout the central superior mesenteric vein and main portal vein. The tract was then dilated to 8 mm with an 8 mm x 8 cm Athletis balloon. A 8-10 mm by 7 + 2 cm of Viatorr endograft was placed. This was dilated to 8 mm. The indwelling 10 French sheath was then exchanged for a 16 French, 33 cm dry seal sheath. The sheath was directed to the portal aspect of the endograft. Aspiration thrombectomy was then performed in multiple passes over the wire through the main portal vein and into the central superior mesenteric vein. Moderate acute and chronic appearing thrombus were collected in the aspiration canister. A sample was sent for pathology. Repeat portal venogram demonstrated persistent occlusive appearing thrombus in the main portal vein and irregular luminal filling defects in the central superior mesenteric vein. Therefore, balloon angioplasty was performed with a 6 mm x 8 cm Athletis balloon. Repeat portal venogram demonstrated no evidence of extravasation in mildly restored luminal gain in the central superior mesenteric vein. This region was then treated with a 6 mm x 8 cm In.Pact drug coated balloon prolonged inflation for total of 3 minutes. After angioplasty, the central superior mesenteric vein appear patent with inline flow into the indwelling  tips endograft. Additional balloon angioplasty through the tips endograft within 8 mm balloon was performed. Repeat portal venogram demonstrated minimal improved patency. Additional aspiration thrombectomy was performed through the tips endograft which yielded moderate acute appearing thrombus. Repeat portal venogram demonstrated persistent near occlusive thrombus throughout the indwelling tips endograft in main portal vein. Therefore, mechanical thrombectomy was performed through the tips endograft and main portal vein into the central superior mesenteric vein with a 6 [place] cleaner device undergoing multiple passes. Repeat portal venogram demonstrated improved patency and inline flow via the indwelling tips, however with multiple irregular luminal filling defects and sluggish flow. Retrograde flow was noted into the left gastric vein which filled multiple small gastroesophageal varices. The indwelling tips endograft was then dilated with a 10 mm x 80 mm Athletis balloon. Repeat portal venogram demonstrated improved patency and antegrade flow through the indwelling endograft. There is persistent retrograde filling of the left gastric vein. Therefore, a 5 French C2 catheter was used to select the left gastric vein. Left gastric venogram demonstrated retrograde flow with opacification and irregular vascularity about multifocal small gastroesophageal varices. Therefore, coil embolization was performed about the central left gastric vein with 2, 6 mm 0.035 "Azur detachable coils. Completion portal  venogram demonstrated successfully embolized left gastric vein with antegrade flow via patent main portal vein in newly placed TIPS endograft, however several scattered filling defects remain in the main portal vein and in the endograft. The catheters and sheath were removed and manual compression was applied to the right internal jugular and right greater saphenous venous access sites until hemostasis was achieved. The  patient was administered 60 mg of subcutaneous Lovenox. The patient was transferred to the PACU in stable condition. IMPRESSION: 1. 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 2. 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 3. Technically successful aspiration and mechanical thrombectomy of the superior mesenteric and main portal veins. 4. Technically successful plain and drug coated balloon angioplasty of the superior mesenteric and main portal veins. 5. Technically successful coil embolization of the left gastric vein. PLAN: Begin therapeutic Lovenox for 1 month prior to transitioning back to oral anticoagulation. IR will follow while inpatient and arrange outpatient followup within 1 month after discharge. Marliss Coots, MD [facility] Radiology Electronically Signed   By: Marliss Coots M.D.   On: 02/09/2023 22:10   IR US Guide Vasc Access Right Result Date: 02/09/2023 CLINICAL DATA:  78 year old female with history of pancreatic cancer status post radiation with development of progressive acute portal vein thrombus and recurrent gastric hemorrhage. EXAM: 1. Ultrasound-guided access of the right internal jugular vein 2. Ultrasound-guided access of the right greater saphenous vein 3. Hepatic venogram 4. Intravascular ultrasound 5. Catheterization of the portal vein 6. Portal venogram 7. Creation of a transhepatic portal vein to hepatic vein shunt 8. Aspiration thrombectomy of the portal vein and superior mesenteric vein 9. Mechanical thrombectomy of the portal vein and superior mesenteric vein 10. Drug coated balloon angioplasty of the main portal vein and superior mesenteric vein 11. Embolization of left gastric vein MEDICATIONS: As antibiotic prophylaxis, Rocephin 1 gm IV was ordered pre-procedure and administered intravenously within one  hour of incision. ANESTHESIA/SEDATION: General - as administered by the Anesthesia department CONTRAST:  One hundred ML Omnipaque 300, intravenous FLUOROSCOPY TIME:  2,580 mGy COMPLICATIONS: None immediate. PROCEDURE: The procedure was performed in concert with my partner Dr. Katherina Right. Informed written consent was obtained from the patient after a thorough discussion of the procedural risks, benefits and alternatives. All questions were addressed. Maximal Sterile Barrier Technique was utilized including caps, mask, sterile gowns, sterile gloves, sterile drape, hand hygiene and skin antiseptic. A timeout was performed prior to the initiation of the procedure. A preliminary ultrasound of the right groin was performed and demonstrates a patent right common femoral vein and central greater saphenous vein. A permanent ultrasound image was recorded. Using a combination of fluoroscopy and ultrasound, an access site was determined. A small dermatotomy was made at the planned puncture site. Using ultrasound guidance, access into the right greater saphenous vein was obtained with visualization of needle entry into the vessel using a standard micropuncture technique. A wire was advanced into the IVC insert all fascial dilation performed. An 8 [place], 11 cm vascular sheath was placed into the external iliac vein. Through this access site, an 75 [place] ICE catheter was advanced with ease under fluoroscopic guidance to the level of the intrahepatic inferior vena cava. A preliminary ultrasound of the right neck was performed and demonstrates a patent internal jugular vein. A permanent ultrasound image was recorded. Using a combination of fluoroscopy and ultrasound, an access site was determined.  A small dermatotomy was made at the planned puncture site. Using ultrasound guidance, access into the right internal jugular vein was obtained with visualization of needle entry into the vessel using a standard micropuncture  technique. A wire was advanced into the IVC and serial fascial dilation performed. A 10 French tips sheath was placed into the internal jugular vein and advanced to the IVC. The jugular sheath was retracted into the right atrium and manometry was performed measuring a mean pressure of 11 mmHg. A 5 French angled tip catheter was then directed into the right hepatic vein. Hepatic venogram was performed. These images demonstrated patent hepatic vein with no stenosis. The catheter was advanced to a wedge portion of the a patent vein over which the 10 French sheath was advanced into the right hepatic vein. Using ICE ultrasound visualization the catheter as right hepatic vein as well as the portal anatomy was defined. There was acute appearing, mildly expansile heterogeneously hypoechoic thrombus throughout the portal veins. A planned exit site from the hepatic vein and puncture site from the portal vein was placed into a single sonographic plane. Under direct ultrasound visualization, the ScorpionX needle was advanced into the central right portal vein. A Glidewire Advantage was then advanced however coursed laterally in appeared to follow and extrahepatic location. Upon manipulating to preserve portal venous access, hepatic vein access was lost with the base sheath. Therefore, the jugular sheath was then retracted into the right atrium and a 5 French angled tip catheter was directed into the middle hepatic vein. Hepatic venogram was performed. These images demonstrated patent hepatic vein with no stenosis. The catheter was advanced to a wedged portion of the patent vein over which the 10 French sheath was advanced into the middle hepatic vein. Using ICE ultrasound visualization a planned exit site from the hepatic vein and portal puncture site were placed into a single sonographic plane. Under direct ultrasound visualization, the ScorpionX needle was advanced into the central left portal vein. There is moderate  redundancy in looping of the wire in the left portal vein prior to entering the main portal vein which required several manipulation techniques to reduce including balloon dilation and insertion of a stiff buddy wire. A 5 French marking pigtail catheter was then advanced over the wire into the superior mesenteric vein and wire removed. Portal venogram was performed which demonstrated occlusive thrombus throughout the central superior mesenteric vein and main portal vein. The tract was then dilated to 8 mm with an 8 mm x 8 cm Athletis balloon. A 8-10 mm by 7 + 2 cm of Viatorr endograft was placed. This was dilated to 8 mm. The indwelling 10 French sheath was then exchanged for a 16 French, 33 cm dry seal sheath. The sheath was directed to the portal aspect of the endograft. Aspiration thrombectomy was then performed in multiple passes over the wire through the main portal vein and into the central superior mesenteric vein. Moderate acute and chronic appearing thrombus were collected in the aspiration canister. A sample was sent for pathology. Repeat portal venogram demonstrated persistent occlusive appearing thrombus in the main portal vein and irregular luminal filling defects in the central superior mesenteric vein. Therefore, balloon angioplasty was performed with a 6 mm x 8 cm Athletis balloon. Repeat portal venogram demonstrated no evidence of extravasation in mildly restored luminal gain in the central superior mesenteric vein. This region was then treated with a 6 mm x 8 cm In.Pact drug coated balloon prolonged inflation for total of 3  minutes. After angioplasty, the central superior mesenteric vein appear patent with inline flow into the indwelling tips endograft. Additional balloon angioplasty through the tips endograft within 8 mm balloon was performed. Repeat portal venogram demonstrated minimal improved patency. Additional aspiration thrombectomy was performed through the tips endograft which yielded  moderate acute appearing thrombus. Repeat portal venogram demonstrated persistent near occlusive thrombus throughout the indwelling tips endograft in main portal vein. Therefore, mechanical thrombectomy was performed through the tips endograft and main portal vein into the central superior mesenteric vein with a 6 [place] cleaner device undergoing multiple passes. Repeat portal venogram demonstrated improved patency and inline flow via the indwelling tips, however with multiple irregular luminal filling defects and sluggish flow. Retrograde flow was noted into the left gastric vein which filled multiple small gastroesophageal varices. The indwelling tips endograft was then dilated with a 10 mm x 80 mm Athletis balloon. Repeat portal venogram demonstrated improved patency and antegrade flow through the indwelling endograft. There is persistent retrograde filling of the left gastric vein. Therefore, a 5 French C2 catheter was used to select the left gastric vein. Left gastric venogram demonstrated retrograde flow with opacification and irregular vascularity about multifocal small gastroesophageal varices. Therefore, coil embolization was performed about the central left gastric vein with 2, 6 mm 0.035 "Azur detachable coils. Completion portal venogram demonstrated successfully embolized left gastric vein with antegrade flow via patent main portal vein in newly placed TIPS endograft, however several scattered filling defects remain in the main portal vein and in the endograft. The catheters and sheath were removed and manual compression was applied to the right internal jugular and right greater saphenous venous access sites until hemostasis was achieved. The patient was administered 60 mg of subcutaneous Lovenox. The patient was transferred to the PACU in stable condition. IMPRESSION: 1. 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 2. Extensive acute and subacute thrombus throughout the central  superior mesenteric vein, main portal vein, and bilateral intrahepatic portal veins. 3. Technically successful aspiration and mechanical thrombectomy of the superior mesenteric and main portal veins. 4. Technically successful plain and drug coated balloon angioplasty of the superior mesenteric and main portal veins. 5. Technically successful coil embolization of the left gastric vein. PLAN: Begin therapeutic Lovenox for 1 month prior to transitioning back to oral anticoagulation. IR will follow while inpatient and arrange outpatient followup within 1 month after discharge. Marliss Coots, MD [facility] Radiology Electronically Signed   By: Marliss Coots M.D.   On: 02/09/2023 22:10   IR US Guide Vasc Access Right Result Date: 02/09/2023 CLINICAL DATA:  78 year old female with history of pancreatic cancer status post radiation with development of progressive acute portal vein thrombus and recurrent gastric hemorrhage. EXAM: 1. Ultrasound-guided access of the right internal jugular vein 2. Ultrasound-guided access of the right greater saphenous vein 3. Hepatic venogram 4. Intravascular ultrasound 5. Catheterization of the portal vein 6. Portal venogram 7. Creation of a transhepatic portal vein to hepatic vein shunt 8. Aspiration thrombectomy of the portal vein and superior mesenteric vein 9. Mechanical thrombectomy of the portal vein and superior mesenteric vein 10. Drug coated balloon angioplasty of the main portal vein and superior mesenteric vein 11. Embolization of left gastric vein MEDICATIONS: As antibiotic prophylaxis, Rocephin 1 gm IV was ordered pre-procedure and administered intravenously within one hour of incision. ANESTHESIA/SEDATION: General - as administered by the Anesthesia department CONTRAST:  One hundred ML Omnipaque 300, intravenous FLUOROSCOPY TIME:  2,580 mGy COMPLICATIONS: None immediate. PROCEDURE: The  procedure was performed in concert with my  partner Dr. Katherina Right. Informed written consent was obtained from the patient after a thorough discussion of the procedural risks, benefits and alternatives. All questions were addressed. Maximal Sterile Barrier Technique was utilized including caps, mask, sterile gowns, sterile gloves, sterile drape, hand hygiene and skin antiseptic. A timeout was performed prior to the initiation of the procedure. A preliminary ultrasound of the right groin was performed and demonstrates a patent right common femoral vein and central greater saphenous vein. A permanent ultrasound image was recorded. Using a combination of fluoroscopy and ultrasound, an access site was determined. A small dermatotomy was made at the planned puncture site. Using ultrasound guidance, access into the right greater saphenous vein was obtained with visualization of needle entry into the vessel using a standard micropuncture technique. A wire was advanced into the IVC insert all fascial dilation performed. An 8 [place], 11 cm vascular sheath was placed into the external iliac vein. Through this access site, an 54 [place] ICE catheter was advanced with ease under fluoroscopic guidance to the level of the intrahepatic inferior vena cava. A preliminary ultrasound of the right neck was performed and demonstrates a patent internal jugular vein. A permanent ultrasound image was recorded. Using a combination of fluoroscopy and ultrasound, an access site was determined. A small dermatotomy was made at the planned puncture site. Using ultrasound guidance, access into the right internal jugular vein was obtained with visualization of needle entry into the vessel using a standard micropuncture technique. A wire was advanced into the IVC and serial fascial dilation performed. A 10 French tips sheath was placed into the internal jugular vein and advanced to the IVC. The jugular sheath was retracted into the right atrium and manometry was performed measuring a  mean pressure of 11 mmHg. A 5 French angled tip catheter was then directed into the right hepatic vein. Hepatic venogram was performed. These images demonstrated patent hepatic vein with no stenosis. The catheter was advanced to a wedge portion of the a patent vein over which the 10 French sheath was advanced into the right hepatic vein. Using ICE ultrasound visualization the catheter as right hepatic vein as well as the portal anatomy was defined. There was acute appearing, mildly expansile heterogeneously hypoechoic thrombus throughout the portal veins. A planned exit site from the hepatic vein and puncture site from the portal vein was placed into a single sonographic plane. Under direct ultrasound visualization, the ScorpionX needle was advanced into the central right portal vein. A Glidewire Advantage was then advanced however coursed laterally in appeared to follow and extrahepatic location. Upon manipulating to preserve portal venous access, hepatic vein access was lost with the base sheath. Therefore, the jugular sheath was then retracted into the right atrium and a 5 French angled tip catheter was directed into the middle hepatic vein. Hepatic venogram was performed. These images demonstrated patent hepatic vein with no stenosis. The catheter was advanced to a wedged portion of the patent vein over which the 10 French sheath was advanced into the middle hepatic vein. Using ICE ultrasound visualization a planned exit site from the hepatic vein and portal puncture site were placed into a single sonographic plane. Under direct ultrasound visualization, the ScorpionX needle was advanced into the central left portal vein. There is moderate redundancy in looping of the wire in the left portal vein prior to entering the main portal vein which required several manipulation techniques to reduce including balloon dilation and insertion of  a stiff buddy wire. A 5 French marking pigtail catheter was then advanced over  the wire into the superior mesenteric vein and wire removed. Portal venogram was performed which demonstrated occlusive thrombus throughout the central superior mesenteric vein and main portal vein. The tract was then dilated to 8 mm with an 8 mm x 8 cm Athletis balloon. A 8-10 mm by 7 + 2 cm of Viatorr endograft was placed. This was dilated to 8 mm. The indwelling 10 French sheath was then exchanged for a 16 French, 33 cm dry seal sheath. The sheath was directed to the portal aspect of the endograft. Aspiration thrombectomy was then performed in multiple passes over the wire through the main portal vein and into the central superior mesenteric vein. Moderate acute and chronic appearing thrombus were collected in the aspiration canister. A sample was sent for pathology. Repeat portal venogram demonstrated persistent occlusive appearing thrombus in the main portal vein and irregular luminal filling defects in the central superior mesenteric vein. Therefore, balloon angioplasty was performed with a 6 mm x 8 cm Athletis balloon. Repeat portal venogram demonstrated no evidence of extravasation in mildly restored luminal gain in the central superior mesenteric vein. This region was then treated with a 6 mm x 8 cm In.Pact drug coated balloon prolonged inflation for total of 3 minutes. After angioplasty, the central superior mesenteric vein appear patent with inline flow into the indwelling tips endograft. Additional balloon angioplasty through the tips endograft within 8 mm balloon was performed. Repeat portal venogram demonstrated minimal improved patency. Additional aspiration thrombectomy was performed through the tips endograft which yielded moderate acute appearing thrombus. Repeat portal venogram demonstrated persistent near occlusive thrombus throughout the indwelling tips endograft in main portal vein. Therefore, mechanical thrombectomy was performed through the tips endograft and main portal vein into the central  superior mesenteric vein with a 6 [place] cleaner device undergoing multiple passes. Repeat portal venogram demonstrated improved patency and inline flow via the indwelling tips, however with multiple irregular luminal filling defects and sluggish flow. Retrograde flow was noted into the left gastric vein which filled multiple small gastroesophageal varices. The indwelling tips endograft was then dilated with a 10 mm x 80 mm Athletis balloon. Repeat portal venogram demonstrated improved patency and antegrade flow through the indwelling endograft. There is persistent retrograde filling of the left gastric vein. Therefore, a 5 French C2 catheter was used to select the left gastric vein. Left gastric venogram demonstrated retrograde flow with opacification and irregular vascularity about multifocal small gastroesophageal varices. Therefore, coil embolization was performed about the central left gastric vein with 2, 6 mm 0.035 "Azur detachable coils. Completion portal venogram demonstrated successfully embolized left gastric vein with antegrade flow via patent main portal vein in newly placed TIPS endograft, however several scattered filling defects remain in the main portal vein and in the endograft. The catheters and sheath were removed and manual compression was applied to the right internal jugular and right greater saphenous venous access sites until hemostasis was achieved. The patient was administered 60 mg of subcutaneous Lovenox. The patient was transferred to the PACU in stable condition. IMPRESSION: 1. 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 2. 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 3. Technically successful aspiration and mechanical thrombectomy of the superior mesenteric and main portal veins. 4. Technically successful plain and drug coated balloon angioplasty of the superior  mesenteric and main portal veins. 5. Technically successful coil  embolization of the left gastric vein. PLAN: Begin therapeutic Lovenox for 1 month prior to transitioning back to oral anticoagulation. IR will follow while inpatient and arrange outpatient followup within 1 month after discharge. Marliss Coots, MD [facility] Electronically Signed   By: Marliss Coots M.D.   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 EXAM: 1. Ultrasound-guided access of the right internal jugular vein 2. Ultrasound-guided access of the right greater saphenous vein 3. Hepatic venogram 4. Intravascular ultrasound 5. Catheterization of the portal vein 6. Portal venogram 7. Creation of a transhepatic portal vein to hepatic vein shunt 8. Aspiration thrombectomy of the portal vein and superior mesenteric vein 9. Mechanical thrombectomy of the portal vein and superior mesenteric vein 10. Drug coated balloon angioplasty of the main portal vein and superior mesenteric vein 11. Embolization of left gastric vein MEDICATIONS: As antibiotic prophylaxis, Rocephin 1 gm IV was ordered pre-procedure and administered intravenously within one hour of incision. ANESTHESIA/SEDATION: General - as administered by the Anesthesia department CONTRAST:  One hundred ML Omnipaque 300, intravenous FLUOROSCOPY TIME:  2,580 mGy COMPLICATIONS: None immediate. PROCEDURE: The procedure was performed in concert with my partner Dr. Katherina Right. Informed written consent was obtained from the patient after a thorough discussion of the procedural risks, benefits and alternatives. All questions were addressed. Maximal Sterile Barrier Technique was utilized including caps, mask, sterile gowns, sterile  gloves, sterile drape, hand hygiene and skin antiseptic. A timeout was performed prior to the initiation of the procedure. A preliminary ultrasound of the right groin was performed and demonstrates a patent right common femoral vein and central greater saphenous vein. A permanent ultrasound image was recorded. Using a combination of fluoroscopy and ultrasound, an access site was determined. A small dermatotomy was made at the planned puncture site. Using ultrasound guidance, access into the right greater saphenous vein was obtained with visualization of needle entry into the vessel using a standard micropuncture technique. A wire was advanced into the IVC insert all fascial dilation performed. An 8 [place], 11 cm vascular sheath was placed into the external iliac vein. Through this access site, an 55 [place] ICE catheter was advanced with ease under fluoroscopic guidance to the level of the intrahepatic inferior vena cava. A preliminary ultrasound of the right neck was performed and demonstrates a patent internal jugular vein. A permanent ultrasound image was recorded. Using a combination of fluoroscopy and ultrasound, an access site was determined. A small dermatotomy was made at the planned puncture site. Using ultrasound guidance, access into the right internal jugular vein was obtained with visualization of needle entry into the vessel using a standard micropuncture technique. A wire was advanced into the IVC and serial fascial dilation performed. A 10 French tips sheath was placed into the internal jugular vein and advanced to the IVC. The jugular sheath was retracted into the right atrium and manometry was performed measuring a mean pressure of 11 mmHg. A 5 French angled tip catheter was then directed into the right hepatic vein. Hepatic venogram was performed. These images demonstrated patent hepatic vein with no stenosis. The catheter was advanced to a wedge portion of the a patent vein over which the  10 French sheath was advanced into the right hepatic vein. Using ICE ultrasound visualization the catheter as right  hepatic vein as well as the portal anatomy was defined. There was acute appearing, mildly expansile heterogeneously hypoechoic thrombus throughout the portal veins. A planned exit site from the hepatic vein and puncture site from the portal vein was placed into a single sonographic plane. Under direct ultrasound visualization, the ScorpionX needle was advanced into the central right portal vein. A Glidewire Advantage was then advanced however coursed laterally in appeared to follow and extrahepatic location. Upon manipulating to preserve portal venous access, hepatic vein access was lost with the base sheath. Therefore, the jugular sheath was then retracted into the right atrium and a 5 French angled tip catheter was directed into the middle hepatic vein. Hepatic venogram was performed. These images demonstrated patent hepatic vein with no stenosis. The catheter was advanced to a wedged portion of the patent vein over which the 10 French sheath was advanced into the middle hepatic vein. Using ICE ultrasound visualization a planned exit site from the hepatic vein and portal puncture site were placed into a single sonographic plane. Under direct ultrasound visualization, the ScorpionX needle was advanced into the central left portal vein. There is moderate redundancy in looping of the wire in the left portal vein prior to entering the main portal vein which required several manipulation techniques to reduce including balloon dilation and insertion of a stiff buddy wire. A 5 French marking pigtail catheter was then advanced over the wire into the superior mesenteric vein and wire removed. Portal venogram was performed which demonstrated occlusive thrombus throughout the central superior mesenteric vein and main portal vein. The tract was then dilated to 8 mm with an 8 mm x 8 cm Athletis balloon. A 8-10 mm  by 7 + 2 cm of Viatorr endograft was placed. This was dilated to 8 mm. The indwelling 10 French sheath was then exchanged for a 16 French, 33 cm dry seal sheath. The sheath was directed to the portal aspect of the endograft. Aspiration thrombectomy was then performed in multiple passes over the wire through the main portal vein and into the central superior mesenteric vein. Moderate acute and chronic appearing thrombus were collected in the aspiration canister. A sample was sent for pathology. Repeat portal venogram demonstrated persistent occlusive appearing thrombus in the main portal vein and irregular luminal filling defects in the central superior mesenteric vein. Therefore, balloon angioplasty was performed with a 6 mm x 8 cm Athletis balloon. Repeat portal venogram demonstrated no evidence of extravasation in mildly restored luminal gain in the central superior mesenteric vein. This region was then treated with a 6 mm x 8 cm In.Pact drug coated balloon prolonged inflation for total of 3 minutes. After angioplasty, the central superior mesenteric vein appear patent with inline flow into the indwelling tips endograft. Additional balloon angioplasty through the tips endograft within 8 mm balloon was performed. Repeat portal venogram demonstrated minimal improved patency. Additional aspiration thrombectomy was performed through the tips endograft which yielded moderate acute appearing thrombus. Repeat portal venogram demonstrated persistent near occlusive thrombus throughout the indwelling tips endograft in main portal vein. Therefore, mechanical thrombectomy was performed through the tips endograft and main portal vein into the central superior mesenteric vein with a 6 [place] cleaner device undergoing multiple passes. Repeat portal venogram demonstrated improved patency and inline flow via the indwelling tips, however with multiple irregular luminal filling defects and sluggish flow. Retrograde flow was noted  into the left gastric vein which filled multiple small gastroesophageal varices. The indwelling tips endograft was then dilated with a  10 mm x 80 mm Athletis balloon. Repeat portal venogram demonstrated improved patency and antegrade flow through the indwelling endograft. There is persistent retrograde filling of the left gastric vein. Therefore, a 5 French C2 catheter was used to select the left gastric vein. Left gastric venogram demonstrated retrograde flow with opacification and irregular vascularity about multifocal small gastroesophageal varices. Therefore, coil embolization was performed about the central left gastric vein with 2, 6 mm 0.035 "Azur detachable coils. Completion portal venogram demonstrated successfully embolized left gastric vein with antegrade flow via patent main portal vein in newly placed TIPS endograft, however several scattered filling defects remain in the main portal vein and in the endograft. The catheters and sheath were removed and manual compression was applied to the right internal jugular and right greater saphenous venous access sites until hemostasis was achieved. The patient was administered 60 mg of subcutaneous Lovenox. The patient was transferred to the PACU in stable condition. IMPRESSION: 1. 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 2. 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 3. Technically successful aspiration and mechanical thrombectomy of the superior mesenteric and main portal veins. 4. Technically successful plain and drug coated balloon angioplasty of the superior mesenteric and main portal veins. 5. Technically successful coil embolization of the left gastric vein. PLAN: Begin therapeutic Lovenox for 1 month prior to transitioning back to oral anticoagulation. IR will follow while inpatient and arrange outpatient followup within 1 month  after discharge. Marliss Coots, MD [facility] Radiology Electronically Signed   By: Marliss Coots M.D.   On: 02/09/2023 22:10   IR EMBO VENOUS NOT HEMORR HEMANG  INC GUIDE ROADMAPPING Result Date: 02/09/2023 CLINICAL DATA:  78 year old female with history of pancreatic cancer status post radiation with development of progressive acute portal vein thrombus and recurrent gastric hemorrhage. EXAM: 1. Ultrasound-guided access of the right internal jugular vein 2. Ultrasound-guided access of the right greater saphenous vein 3. Hepatic venogram 4. Intravascular ultrasound 5. Catheterization of the portal vein 6. Portal venogram 7. Creation of a transhepatic portal vein to hepatic vein shunt 8. Aspiration thrombectomy of the portal vein and superior mesenteric vein 9. Mechanical thrombectomy of the portal vein and superior mesenteric vein 10. Drug coated balloon angioplasty of the main portal vein and superior mesenteric vein 11. Embolization of left gastric vein MEDICATIONS: As antibiotic prophylaxis, Rocephin 1 gm IV was ordered pre-procedure and administered intravenously within one hour of incision. ANESTHESIA/SEDATION: General - as administered by the Anesthesia department CONTRAST:  One hundred ML Omnipaque 300, intravenous FLUOROSCOPY TIME:  2,580 mGy COMPLICATIONS: None immediate. PROCEDURE: The procedure was performed in concert with my partner Dr. Katherina Right. Informed written consent was obtained from the patient after a thorough discussion of the procedural risks, benefits and alternatives. All questions were addressed. Maximal Sterile Barrier Technique was utilized including caps, mask, sterile gowns, sterile gloves, sterile drape, hand hygiene and skin antiseptic. A timeout was performed prior to the initiation of the procedure. A preliminary ultrasound of the right groin was performed and demonstrates a patent right common femoral vein and central greater  saphenous vein. A permanent ultrasound image was recorded. Using a combination of fluoroscopy and ultrasound, an access site was determined. A small dermatotomy was made at the planned puncture site. Using ultrasound guidance, access into the right greater saphenous vein was obtained with visualization of needle entry into the vessel using a standard micropuncture technique. A  wire was advanced into the IVC insert all fascial dilation performed. An 8 [place], 11 cm vascular sheath was placed into the external iliac vein. Through this access site, an 86 [place] ICE catheter was advanced with ease under fluoroscopic guidance to the level of the intrahepatic inferior vena cava. A preliminary ultrasound of the right neck was performed and demonstrates a patent internal jugular vein. A permanent ultrasound image was recorded. Using a combination of fluoroscopy and ultrasound, an access site was determined. A small dermatotomy was made at the planned puncture site. Using ultrasound guidance, access into the right internal jugular vein was obtained with visualization of needle entry into the vessel using a standard micropuncture technique. A wire was advanced into the IVC and serial fascial dilation performed. A 10 French tips sheath was placed into the internal jugular vein and advanced to the IVC. The jugular sheath was retracted into the right atrium and manometry was performed measuring a mean pressure of 11 mmHg. A 5 French angled tip catheter was then directed into the right hepatic vein. Hepatic venogram was performed. These images demonstrated patent hepatic vein with no stenosis. The catheter was advanced to a wedge portion of the a patent vein over which the 10 French sheath was advanced into the right hepatic vein. Using ICE ultrasound visualization the catheter as right hepatic vein as well as the portal anatomy was defined. There was acute appearing, mildly expansile heterogeneously hypoechoic thrombus  throughout the portal veins. A planned exit site from the hepatic vein and puncture site from the portal vein was placed into a single sonographic plane. Under direct ultrasound visualization, the ScorpionX needle was advanced into the central right portal vein. A Glidewire Advantage was then advanced however coursed laterally in appeared to follow and extrahepatic location. Upon manipulating to preserve portal venous access, hepatic vein access was lost with the base sheath. Therefore, the jugular sheath was then retracted into the right atrium and a 5 French angled tip catheter was directed into the middle hepatic vein. Hepatic venogram was performed. These images demonstrated patent hepatic vein with no stenosis. The catheter was advanced to a wedged portion of the patent vein over which the 10 French sheath was advanced into the middle hepatic vein. Using ICE ultrasound visualization a planned exit site from the hepatic vein and portal puncture site were placed into a single sonographic plane. Under direct ultrasound visualization, the ScorpionX needle was advanced into the central left portal vein. There is moderate redundancy in looping of the wire in the left portal vein prior to entering the main portal vein which required several manipulation techniques to reduce including balloon dilation and insertion of a stiff buddy wire. A 5 French marking pigtail catheter was then advanced over the wire into the superior mesenteric vein and wire removed. Portal venogram was performed which demonstrated occlusive thrombus throughout the central superior mesenteric vein and main portal vein. The tract was then dilated to 8 mm with an 8 mm x 8 cm Athletis balloon. A 8-10 mm by 7 + 2 cm of Viatorr endograft was placed. This was dilated to 8 mm. The indwelling 10 French sheath was then exchanged for a 16 French, 33 cm dry seal sheath. The sheath was directed to the portal aspect of the endograft. Aspiration thrombectomy  was then performed in multiple passes over the wire through the main portal vein and into the central superior mesenteric vein. Moderate acute and chronic appearing thrombus were collected in the aspiration canister.  A sample was sent for pathology. Repeat portal venogram demonstrated persistent occlusive appearing thrombus in the main portal vein and irregular luminal filling defects in the central superior mesenteric vein. Therefore, balloon angioplasty was performed with a 6 mm x 8 cm Athletis balloon. Repeat portal venogram demonstrated no evidence of extravasation in mildly restored luminal gain in the central superior mesenteric vein. This region was then treated with a 6 mm x 8 cm In.Pact drug coated balloon prolonged inflation for total of 3 minutes. After angioplasty, the central superior mesenteric vein appear patent with inline flow into the indwelling tips endograft. Additional balloon angioplasty through the tips endograft within 8 mm balloon was performed. Repeat portal venogram demonstrated minimal improved patency. Additional aspiration thrombectomy was performed through the tips endograft which yielded moderate acute appearing thrombus. Repeat portal venogram demonstrated persistent near occlusive thrombus throughout the indwelling tips endograft in main portal vein. Therefore, mechanical thrombectomy was performed through the tips endograft and main portal vein into the central superior mesenteric vein with a 6 [place] cleaner device undergoing multiple passes. Repeat portal venogram demonstrated improved patency and inline flow via the indwelling tips, however with multiple irregular luminal filling defects and sluggish flow. Retrograde flow was noted into the left gastric vein which filled multiple small gastroesophageal varices. The indwelling tips endograft was then dilated with a 10 mm x 80 mm [employer] balloon. Repeat portal venogram demonstrated improved patency and antegrade flow through the  indwelling endograft. There is persistent retrograde filling of the left gastric vein. Therefore, a 5 French C2 catheter was used to select the left gastric vein. Left gastric venogram demonstrated retrograde flow with opacification and irregular vascularity about multifocal small gastroesophageal varices. Therefore, coil embolization was performed about the central left gastric vein with 2, 6 mm 0.035 "[place] detachable coils. Completion portal venogram demonstrated successfully embolized left gastric vein with antegrade flow via patent main portal vein in newly placed TIPS endograft, however several scattered filling defects remain in the main portal vein and in the endograft. The catheters and sheath were removed and manual compression was applied to the right internal jugular and right greater saphenous venous access sites until hemostasis was achieved. The patient was administered 60 mg of subcutaneous Lovenox. The patient was transferred to the PACU in stable condition. IMPRESSION: 1. 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 2. 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 3. Technically successful aspiration and mechanical thrombectomy of the superior mesenteric and main portal veins. 4. Technically successful plain and drug coated balloon angioplasty of the superior mesenteric and main portal veins. 5. Technically successful coil embolization of the left gastric vein. PLAN: Begin therapeutic Lovenox for 1 month prior to transitioning back to oral anticoagulation. IR will follow while inpatient and arrange outpatient followup within 1 month after discharge. Marliss Coots, MD [facility] Radiology Electronically Signed   By: Marliss Coots M.D.   On: 02/09/2023 22:10   CT ABDOMEN PELVIS W CONTRAST Result Date: 02/06/2023 CLINICAL DATA:   Pancreatitis, acute, severe Patient presents with abdominal pain. Radiologic records indicates history of pancreatic cancer. EXAM: CT ABDOMEN AND PELVIS WITH CONTRAST TECHNIQUE: Multidetector CT imaging of the abdomen and pelvis was performed using the standard protocol following bolus administration of intravenous contrast. RADIATION DOSE REDUCTION: This exam was performed according to the departmental dose-optimization program which includes automated exposure control, adjustment of the mA and/or kV according to patient size  and/or use of iterative reconstruction technique. CONTRAST:  75mL OMNIPAQUE IOHEXOL 350 MG/ML SOLN COMPARISON:  CT 8 days ago.  PET CT 10/28/2022 reviewed FINDINGS: Lower chest: Again seen linear atelectasis or scarring in the left greater than right lower lobe. 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 No pleural effusion. Hepatobiliary: Tiny hypodensity in the right lobe of the liver is unchanged series 3, image 9. No new intrahepatic abnormality. There is progressive portal vein thrombus, increasing volume of intrahepatic portal vein thrombus. The gallbladder is not well seen. Common bile duct is poorly defined on the current exam. Pancreas: Pancreatic atrophy with ductal dilatation, unchanged over the last 8 days. The known hypodense pancreatic mass is grossly unchanged measuring 2.2 cm series 3, image 23. This is less well-defined than on prior exam. Lesion abuts the SMV with progressive SMV thrombus. Peripancreatic fat stranding about the pancreatic head is minor. Spleen: Bilobed appearance of the spleen. No focal splenic abnormality. Adrenals/Urinary Tract: No adrenal nodule. Bilateral parapelvic cysts. No further follow-up imaging is recommended. No hydronephrosis or renal inflammation. Moderate bladder distension, no wall thickening. Stomach/Bowel: Detailed bowel assessment is limited in the absence of enteric contrast. Paraesophageal varices. Wall thickening about the distal  stomach, reference series 3, image 21. Again seen wall thickening of the duodenum with adjacent stranding, for example series 3, image 25. No small bowel obstruction or additional small bowel wall thickening. No small bowel pneumatosis. There is wall thickening about the hepatic flexure of the colon series 3, image 26, increased from prior exam. Transverse colon is redundant. Small to moderate colonic stool burden. Vascular/Lymphatic: Progressive thrombus within the superior mesenteric vein which is now occlusive. Progressive thrombus within the main and intrahepatic portal veins. The splenic vein remains patent. Increasing portosystemic collaterals. Aortic atherosclerosis. No bulky adenopathy. Reproductive: Nonacute. Other: Right upper quadrant fat stranding which is adjacent to gastric, duodenal and colonic wall thickening. No frank ascites. No free air. Right lateral lumbar hernia contains only fat. Musculoskeletal: Posterior rod with intrapedicular screw fusion L2 through L5. Additional multilevel degenerative change in the spine. No evidence of focal bone lesion. IMPRESSION: 1. Progressive thrombus within the superior mesenteric vein over the last 8 days, now occlusive. Progressive thrombus within the main and intrahepatic portal veins. 2. Known hypodense pancreatic mass is grossly unchanged measuring 2.2 cm. This is less well-defined than on prior exam. 3. Peripancreatic fat stranding is mild. 4. Persistent and progressive wall thickening about the distal stomach, duodenum, and hepatic flexure of the colon. Progressive right upper quadrant fat stranding. Etiology of bowel wall thickening and inflammation is indeterminate. 5. Increasing portosystemic collaterals. Paraesophageal varices. Aortic Atherosclerosis (ICD10-I70.0). Electronically Signed   By: Narda Rutherford M.D.   On: 02/06/2023 17:21    Labs:  CBC: Recent Labs    02/08/23 0329 02/09/23 0404 02/10/23 0040 02/10/23 1147  WBC 3.5* 3.7* 8.2  8.6  HGB 8.0* 8.1* 7.1* 6.7*  HCT 25.3* 26.1* 22.4* 20.9*  PLT 133* 157 171 134*    COAGS: Recent Labs    12/27/22 1923 01/29/23 1137 02/09/23 0404  INR 1.1 0.9 1.1  APTT 58*  --   --     BMP: Recent Labs    02/07/23 0208 02/08/23 0329 02/09/23 0404 02/10/23 0040  NA 139 140 141 140  K 3.6 3.6 3.6 3.5  CL 111 112* 111 108  CO2 20* 23 22 22   GLUCOSE 114* 136* 109* 130*  BUN 17 9 9 14   CALCIUM 8.5* 8.4* 8.5* 8.0*  CREATININE 0.74 0.73 0.84 0.93  GFRNONAA >60 >60 >60 >60    LIVER FUNCTION TESTS: Recent Labs    02/06/23 1214 02/08/23 0329 02/09/23 0404 02/10/23 0040  BILITOT 0.4 0.6 0.6 0.7  AST 34 23 22 91*  ALT 40 28 27 82*  ALKPHOS 110 89 100 89  PROT 5.8* 5.0* 5.3* 5.2*  ALBUMIN 3.0* 2.5* 2.6* 2.7*    Assessment and Plan:  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 S/p TIPS creation, aspiration and mechanical portal thrombectomy, main portal vein and SMV drug coated balloon angioplasty and embolization of left gastric vein.    Patient is drowsy this morning but able to answer questions appropriately. She is complaining of generalized abdominal discomfort which she attributes to gas pains. Her procedural vascular sites are clean, soft, dry and non-tender. She is afebrile with stable vital signs. Hemoglobin level decreased overnight: 8.1 -->6.7.   Ok to remove gauze and tegaderm tomorrow morning. Monitor H&H. Monitor for signs/symptoms of hepatic encephalopathy. Patient not currently receiving lactulose. Patient will be scheduled for one month follow up with Dr. Elby Showers.   IR will continue to follow while inpatient.   Electronically Signed: Alwyn Ren, AGACNP-BC 02/10/2023, 1:26 PM   I spent a total of 15 Minutes at the the patient's bedside AND on the patient's hospital floor or unit, greater than 50% of which was counseling/coordinating care for SMV and portal vein thrombus with recurrent GI bleeding.

## 2023-02-10 NOTE — Progress Notes (Signed)
Gastroenterology Inpatient Follow Up    Subjective: Having some abdominal pain after her TIPS with partial synovectomy procedure yesterday.  She does think that this pain is improving over time  Objective: Vital signs in last 24 hours: Temp:  [97.7 F (36.5 C)-99.3 F (37.4 C)] 98.5 F (36.9 C) (01/16 1643) Pulse Rate:  [99-115] 103 (01/16 1643) Resp:  [13-21] 16 (01/16 1643) BP: (100-126)/(60-96) 111/60 (01/16 1643) SpO2:  [95 %-98 %] 95 % (01/16 1643) Last BM Date : 02/09/23  Intake/Output from previous day: 01/15 0701 - 01/16 0700 In: 1850 [I.V.:1500; IV Piggyback:350] Out: 825 [Urine:675; Blood:150] Intake/Output this shift: No intake/output data recorded.  General appearance: alert and cooperative Resp: No increased WOB Cardio: Regular rate GI: Tender to palpation, nondistended Extremities: No BLE edema  Lab Results: Recent Labs    02/09/23 0404 02/10/23 0040 02/10/23 1147  WBC 3.7* 8.2 8.6  HGB 8.1* 7.1* 6.7*  HCT 26.1* 22.4* 20.9*  PLT 157 171 134*   BMET Recent Labs    02/08/23 0329 02/09/23 0404 02/10/23 0040  NA 140 141 140  K 3.6 3.6 3.5  CL 112* 111 108  CO2 23 22 22   GLUCOSE 136* 109* 130*  BUN 9 9 14   CREATININE 0.73 0.84 0.93  CALCIUM 8.4* 8.5* 8.0*   LFT Recent Labs    02/10/23 0040  PROT 5.2*  ALBUMIN 2.7*  AST 91*  ALT 82*  ALKPHOS 89  BILITOT 0.7   PT/INR Recent Labs    02/09/23 0404  LABPROT 13.9  INR 1.1   Hepatitis Panel No results for input(s): "HEPBSAG", "HCVAB", "HEPAIGM", "HEPBIGM" in the last 72 hours. C-Diff No results for input(s): "CDIFFTOX" in the last 72 hours.  Studies/Results: IR Tips Result Date: 02/09/2023 CLINICAL DATA:  78 year old female with history of pancreatic cancer status post radiation with development of progressive acute portal vein thrombus and recurrent gastric hemorrhage. EXAM: 1. Ultrasound-guided access of the right internal jugular vein 2. Ultrasound-guided access of the right  greater saphenous vein 3. Hepatic venogram 4. Intravascular ultrasound 5. Catheterization of the portal vein 6. Portal venogram 7. Creation of a transhepatic portal vein to hepatic vein shunt 8. Aspiration thrombectomy of the portal vein and superior mesenteric vein 9. Mechanical thrombectomy of the portal vein and superior mesenteric vein 10. Drug coated balloon angioplasty of the main portal vein and superior mesenteric vein 11. Embolization of left gastric vein MEDICATIONS: As antibiotic prophylaxis, Rocephin 1 gm IV was ordered pre-procedure and administered intravenously within one hour of incision. ANESTHESIA/SEDATION: General - as administered by the Anesthesia department CONTRAST:  One hundred ML Omnipaque 300, intravenous FLUOROSCOPY TIME:  2,580 mGy COMPLICATIONS: None immediate. PROCEDURE: The procedure was performed in concert with my partner Dr. Katherina Right. Informed written consent was obtained from the patient after a thorough discussion of the procedural risks, benefits and alternatives. All questions were addressed. Maximal Sterile Barrier Technique was utilized including caps, mask, sterile gowns, sterile gloves, sterile drape, hand hygiene and skin antiseptic. A timeout was performed prior to the initiation of the procedure. A preliminary ultrasound of the right groin was performed and demonstrates a patent right common femoral vein and central greater saphenous vein. A permanent ultrasound image was recorded. Using a combination of fluoroscopy and ultrasound, an access site was determined. A small dermatotomy was made at the planned puncture site. Using ultrasound guidance, access into the right greater saphenous vein was obtained with visualization of needle entry into the vessel using a standard  micropuncture technique. A wire was advanced into the IVC insert all fascial dilation performed. An 8 [place], 11 cm vascular sheath was placed into the external iliac vein. Through this access site, an 54  [place] ICE catheter was advanced with ease under fluoroscopic guidance to the level of the intrahepatic inferior vena cava. A preliminary ultrasound of the right neck was performed and demonstrates a patent internal jugular vein. A permanent ultrasound image was recorded. Using a combination of fluoroscopy and ultrasound, an access site was determined. A small dermatotomy was made at the planned puncture site. Using ultrasound guidance, access into the right internal jugular vein was obtained with visualization of needle entry into the vessel using a standard micropuncture technique. A wire was advanced into the IVC and serial fascial dilation performed. A 10 French tips sheath was placed into the internal jugular vein and advanced to the IVC. The jugular sheath was retracted into the right atrium and manometry was performed measuring a mean pressure of 11 mmHg. A 5 French angled tip catheter was then directed into the right hepatic vein. Hepatic venogram was performed. These images demonstrated patent hepatic vein with no stenosis. The catheter was advanced to a wedge portion of the a patent vein over which the 10 French sheath was advanced into the right hepatic vein. Using ICE ultrasound visualization the catheter as right hepatic vein as well as the portal anatomy was defined. There was acute appearing, mildly expansile heterogeneously hypoechoic thrombus throughout the portal veins. A planned exit site from the hepatic vein and puncture site from the portal vein was placed into a single sonographic plane. Under direct ultrasound visualization, the ScorpionX needle was advanced into the central right portal vein. A Glidewire Advantage was then advanced however coursed laterally in appeared to follow and extrahepatic location. Upon manipulating to preserve portal venous access, hepatic vein access was lost with the base sheath. Therefore, the jugular sheath was then retracted into the right atrium and a 5  French angled tip catheter was directed into the middle hepatic vein. Hepatic venogram was performed. These images demonstrated patent hepatic vein with no stenosis. The catheter was advanced to a wedged portion of the patent vein over which the 10 French sheath was advanced into the middle hepatic vein. Using ICE ultrasound visualization a planned exit site from the hepatic vein and portal puncture site were placed into a single sonographic plane. Under direct ultrasound visualization, the ScorpionX needle was advanced into the central left portal vein. There is moderate redundancy in looping of the wire in the left portal vein prior to entering the main portal vein which required several manipulation techniques to reduce including balloon dilation and insertion of a stiff buddy wire. A 5 French marking pigtail catheter was then advanced over the wire into the superior mesenteric vein and wire removed. Portal venogram was performed which demonstrated occlusive thrombus throughout the central superior mesenteric vein and main portal vein. The tract was then dilated to 8 mm with an 8 mm x 8 cm Athletis balloon. A 8-10 mm by 7 + 2 cm of Viatorr endograft was placed. This was dilated to 8 mm. The indwelling 10 French sheath was then exchanged for a 16 French, 33 cm dry seal sheath. The sheath was directed to the portal aspect of the endograft. Aspiration thrombectomy was then performed in multiple passes over the wire through the main portal vein and into the central superior mesenteric vein. Moderate acute and chronic appearing thrombus were collected in  the aspiration canister. A sample was sent for pathology. Repeat portal venogram demonstrated persistent occlusive appearing thrombus in the main portal vein and irregular luminal filling defects in the central superior mesenteric vein. Therefore, balloon angioplasty was performed with a 6 mm x 8 cm Athletis balloon. Repeat portal venogram demonstrated no evidence of  extravasation in mildly restored luminal gain in the central superior mesenteric vein. This region was then treated with a 6 mm x 8 cm In.Pact drug coated balloon prolonged inflation for total of 3 minutes. After angioplasty, the central superior mesenteric vein appear patent with inline flow into the indwelling tips endograft. Additional balloon angioplasty through the tips endograft within 8 mm balloon was performed. Repeat portal venogram demonstrated minimal improved patency. Additional aspiration thrombectomy was performed through the tips endograft which yielded moderate acute appearing thrombus. Repeat portal venogram demonstrated persistent near occlusive thrombus throughout the indwelling tips endograft in main portal vein. Therefore, mechanical thrombectomy was performed through the tips endograft and main portal vein into the central superior mesenteric vein with a 6 [place] cleaner device undergoing multiple passes. Repeat portal venogram demonstrated improved patency and inline flow via the indwelling tips, however with multiple irregular luminal filling defects and sluggish flow. Retrograde flow was noted into the left gastric vein which filled multiple small gastroesophageal varices. The indwelling tips endograft was then dilated with a 10 mm x 80 mm Athletis balloon. Repeat portal venogram demonstrated improved patency and antegrade flow through the indwelling endograft. There is persistent retrograde filling of the left gastric vein. Therefore, a 5 French C2 catheter was used to select the left gastric vein. Left gastric venogram demonstrated retrograde flow with opacification and irregular vascularity about multifocal small gastroesophageal varices. Therefore, coil embolization was performed about the central left gastric vein with 2, 6 mm 0.035 "Azur detachable coils. Completion portal venogram demonstrated successfully embolized left gastric vein with antegrade flow via patent main portal vein in  newly placed TIPS endograft, however several scattered filling defects remain in the main portal vein and in the endograft. The catheters and sheath were removed and manual compression was applied to the right internal jugular and right greater saphenous venous access sites until hemostasis was achieved. The patient was administered 60 mg of subcutaneous Lovenox. The patient was transferred to the PACU in stable condition. IMPRESSION: 1. 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 2. 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 3. Technically successful aspiration and mechanical thrombectomy of the superior mesenteric and main portal veins. 4. Technically successful plain and drug coated balloon angioplasty of the superior mesenteric and main portal veins. 5. Technically successful coil embolization of the left gastric vein. PLAN: Begin therapeutic Lovenox for 1 month prior to transitioning back to oral anticoagulation. IR will follow while inpatient and arrange outpatient followup within 1 month after discharge. Marliss Coots, MD [facility] Radiology Electronically Signed   By: Marliss Coots M.D.   On: 02/09/2023 22:10   IR US Guide Vasc Access Right Result Date: 02/09/2023 CLINICAL DATA:  78 year old female with history of pancreatic cancer status post radiation with development of progressive acute portal vein thrombus and recurrent gastric hemorrhage. EXAM: 1. Ultrasound-guided access of the right internal jugular vein 2. Ultrasound-guided access of the right greater saphenous vein 3. Hepatic venogram 4. Intravascular ultrasound 5. Catheterization of the portal vein 6. Portal venogram 7. Creation of a transhepatic portal vein to hepatic vein shunt  8. Aspiration thrombectomy of the portal vein and superior mesenteric vein 9. Mechanical  thrombectomy of the portal vein and superior mesenteric vein 10. Drug coated balloon angioplasty of the main portal vein and superior mesenteric vein 11. Embolization of left gastric vein MEDICATIONS: As antibiotic prophylaxis, Rocephin 1 gm IV was ordered pre-procedure and administered intravenously within one hour of incision. ANESTHESIA/SEDATION: General - as administered by the Anesthesia department CONTRAST:  One hundred ML Omnipaque 300, intravenous FLUOROSCOPY TIME:  2,580 mGy COMPLICATIONS: None immediate. PROCEDURE: The procedure was performed in concert with my partner Dr. Katherina Right. Informed written consent was obtained from the patient after a thorough discussion of the procedural risks, benefits and alternatives. All questions were addressed. Maximal Sterile Barrier Technique was utilized including caps, mask, sterile gowns, sterile gloves, sterile drape, hand hygiene and skin antiseptic. A timeout was performed prior to the initiation of the procedure. A preliminary ultrasound of the right groin was performed and demonstrates a patent right common femoral vein and central greater saphenous vein. A permanent ultrasound image was recorded. Using a combination of fluoroscopy and ultrasound, an access site was determined. A small dermatotomy was made at the planned puncture site. Using ultrasound guidance, access into the right greater saphenous vein was obtained with visualization of needle entry into the vessel using a standard micropuncture technique. A wire was advanced into the IVC insert all fascial dilation performed. An 8 [place], 11 cm vascular sheath was placed into the external iliac vein. Through this access site, an 23 [place] ICE catheter was advanced with ease under fluoroscopic guidance to the level of the intrahepatic inferior vena cava. A preliminary ultrasound of the right neck was performed and demonstrates a patent internal jugular vein. A permanent ultrasound image was recorded.  Using a combination of fluoroscopy and ultrasound, an access site was determined. A small dermatotomy was made at the planned puncture site. Using ultrasound guidance, access into the right internal jugular vein was obtained with visualization of needle entry into the vessel using a standard micropuncture technique. A wire was advanced into the IVC and serial fascial dilation performed. A 10 French tips sheath was placed into the internal jugular vein and advanced to the IVC. The jugular sheath was retracted into the right atrium and manometry was performed measuring a mean pressure of 11 mmHg. A 5 French angled tip catheter was then directed into the right hepatic vein. Hepatic venogram was performed. These images demonstrated patent hepatic vein with no stenosis. The catheter was advanced to a wedge portion of the a patent vein over which the 10 French sheath was advanced into the right hepatic vein. Using ICE ultrasound visualization the catheter as right hepatic vein as well as the portal anatomy was defined. There was acute appearing, mildly expansile heterogeneously hypoechoic thrombus throughout the portal veins. A planned exit site from the hepatic vein and puncture site from the portal vein was placed into a single sonographic plane. Under direct ultrasound visualization, the ScorpionX needle was advanced into the central right portal vein. A Glidewire Advantage was then advanced however coursed laterally in appeared to follow and extrahepatic location. Upon manipulating to preserve portal venous access, hepatic vein access was lost with the base sheath. Therefore, the jugular sheath was then retracted into the right atrium and a 5 French angled tip catheter was directed into the middle hepatic vein. Hepatic venogram was performed. These images demonstrated patent hepatic vein with no stenosis. The catheter was advanced to a wedged portion of  the patent vein over which the 10 French sheath was advanced into  the middle hepatic vein. Using ICE ultrasound visualization a planned exit site from the hepatic vein and portal puncture site were placed into a single sonographic plane. Under direct ultrasound visualization, the ScorpionX needle was advanced into the central left portal vein. There is moderate redundancy in looping of the wire in the left portal vein prior to entering the main portal vein which required several manipulation techniques to reduce including balloon dilation and insertion of a stiff buddy wire. A 5 French marking pigtail catheter was then advanced over the wire into the superior mesenteric vein and wire removed. Portal venogram was performed which demonstrated occlusive thrombus throughout the central superior mesenteric vein and main portal vein. The tract was then dilated to 8 mm with an 8 mm x 8 cm Athletis balloon. A 8-10 mm by 7 + 2 cm of Viatorr endograft was placed. This was dilated to 8 mm. The indwelling 10 French sheath was then exchanged for a 16 French, 33 cm dry seal sheath. The sheath was directed to the portal aspect of the endograft. Aspiration thrombectomy was then performed in multiple passes over the wire through the main portal vein and into the central superior mesenteric vein. Moderate acute and chronic appearing thrombus were collected in the aspiration canister. A sample was sent for pathology. Repeat portal venogram demonstrated persistent occlusive appearing thrombus in the main portal vein and irregular luminal filling defects in the central superior mesenteric vein. Therefore, balloon angioplasty was performed with a 6 mm x 8 cm Athletis balloon. Repeat portal venogram demonstrated no evidence of extravasation in mildly restored luminal gain in the central superior mesenteric vein. This region was then treated with a 6 mm x 8 cm In.Pact drug coated balloon prolonged inflation for total of 3 minutes. After angioplasty, the central superior mesenteric vein appear patent with  inline flow into the indwelling tips endograft. Additional balloon angioplasty through the tips endograft within 8 mm balloon was performed. Repeat portal venogram demonstrated minimal improved patency. Additional aspiration thrombectomy was performed through the tips endograft which yielded moderate acute appearing thrombus. Repeat portal venogram demonstrated persistent near occlusive thrombus throughout the indwelling tips endograft in main portal vein. Therefore, mechanical thrombectomy was performed through the tips endograft and main portal vein into the central superior mesenteric vein with a 6 [place] cleaner device undergoing multiple passes. Repeat portal venogram demonstrated improved patency and inline flow via the indwelling tips, however with multiple irregular luminal filling defects and sluggish flow. Retrograde flow was noted into the left gastric vein which filled multiple small gastroesophageal varices. The indwelling tips endograft was then dilated with a 10 mm x 80 mm Athletis balloon. Repeat portal venogram demonstrated improved patency and antegrade flow through the indwelling endograft. There is persistent retrograde filling of the left gastric vein. Therefore, a 5 French C2 catheter was used to select the left gastric vein. Left gastric venogram demonstrated retrograde flow with opacification and irregular vascularity about multifocal small gastroesophageal varices. Therefore, coil embolization was performed about the central left gastric vein with 2, 6 mm 0.035 "Azur detachable coils. Completion portal venogram demonstrated successfully embolized left gastric vein with antegrade flow via patent main portal vein in newly placed TIPS endograft, however several scattered filling defects remain in the main portal vein and in the endograft. The catheters and sheath were removed and manual compression was applied to the right internal jugular and right greater saphenous venous access sites  until  hemostasis was achieved. The patient was administered 60 mg of subcutaneous Lovenox. The patient was transferred to the PACU in stable condition. IMPRESSION: 1. 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 2. 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 3. Technically successful aspiration and mechanical thrombectomy of the superior mesenteric and main portal veins. 4. Technically successful plain and drug coated balloon angioplasty of the superior mesenteric and main portal veins. 5. Technically successful coil embolization of the left gastric vein. PLAN: Begin therapeutic Lovenox for 1 month prior to transitioning back to oral anticoagulation. IR will follow while inpatient and arrange outpatient followup within 1 month after discharge. Marliss Coots, MD [facility] Radiology Electronically Signed   By: Marliss Coots M.D.   On: 02/09/2023 22:10   IR US Guide Vasc Access Right Result Date: 02/09/2023 CLINICAL DATA:  78 year old female with history of pancreatic cancer status post radiation with development of progressive acute portal vein thrombus and recurrent gastric hemorrhage. EXAM: 1. Ultrasound-guided access of the right internal jugular vein 2. Ultrasound-guided access of the right greater saphenous vein 3. Hepatic venogram 4. Intravascular ultrasound 5. Catheterization of the portal vein 6. Portal venogram 7. Creation of a transhepatic portal vein to hepatic vein shunt 8. Aspiration thrombectomy of the portal vein and superior mesenteric vein 9. Mechanical thrombectomy of the portal vein and superior mesenteric vein 10. Drug coated balloon angioplasty of the main portal vein and superior mesenteric vein 11. Embolization of left gastric vein MEDICATIONS: As antibiotic prophylaxis, Rocephin 1 gm IV was ordered pre-procedure and  administered intravenously within one hour of incision. ANESTHESIA/SEDATION: General - as administered by the Anesthesia department CONTRAST:  One hundred ML Omnipaque 300, intravenous FLUOROSCOPY TIME:  2,580 mGy COMPLICATIONS: None immediate. PROCEDURE: The procedure was performed in concert with my partner Dr. Katherina Right. Informed written consent was obtained from the patient after a thorough discussion of the procedural risks, benefits and alternatives. All questions were addressed. Maximal Sterile Barrier Technique was utilized including caps, mask, sterile gowns, sterile gloves, sterile drape, hand hygiene and skin antiseptic. A timeout was performed prior to the initiation of the procedure. A preliminary ultrasound of the right groin was performed and demonstrates a patent right common femoral vein and central greater saphenous vein. A permanent ultrasound image was recorded. Using a combination of fluoroscopy and ultrasound, an access site was determined. A small dermatotomy was made at the planned puncture site. Using ultrasound guidance, access into the right greater saphenous vein was obtained with visualization of needle entry into the vessel using a standard micropuncture technique. A wire was advanced into the IVC insert all fascial dilation performed. An 8 [place], 11 cm vascular sheath was placed into the external iliac vein. Through this access site, an 70 [place] ICE catheter was advanced with ease under fluoroscopic guidance to the level of the intrahepatic inferior vena cava. A preliminary ultrasound of the right neck was performed and demonstrates a patent internal jugular vein. A permanent ultrasound image was recorded. Using a combination of fluoroscopy and ultrasound, an access site was determined. A small dermatotomy was made at the planned puncture site. Using ultrasound guidance, access into the right internal jugular vein was obtained with visualization of needle entry into the vessel  using a standard micropuncture technique. A wire was advanced into the IVC and serial fascial dilation performed. A 10 French tips sheath was placed into the internal  jugular vein and advanced to the IVC. The jugular sheath was retracted into the right atrium and manometry was performed measuring a mean pressure of 11 mmHg. A 5 French angled tip catheter was then directed into the right hepatic vein. Hepatic venogram was performed. These images demonstrated patent hepatic vein with no stenosis. The catheter was advanced to a wedge portion of the a patent vein over which the 10 French sheath was advanced into the right hepatic vein. Using ICE ultrasound visualization the catheter as right hepatic vein as well as the portal anatomy was defined. There was acute appearing, mildly expansile heterogeneously hypoechoic thrombus throughout the portal veins. A planned exit site from the hepatic vein and puncture site from the portal vein was placed into a single sonographic plane. Under direct ultrasound visualization, the ScorpionX needle was advanced into the central right portal vein. A Glidewire Advantage was then advanced however coursed laterally in appeared to follow and extrahepatic location. Upon manipulating to preserve portal venous access, hepatic vein access was lost with the base sheath. Therefore, the jugular sheath was then retracted into the right atrium and a 5 French angled tip catheter was directed into the middle hepatic vein. Hepatic venogram was performed. These images demonstrated patent hepatic vein with no stenosis. The catheter was advanced to a wedged portion of the patent vein over which the 10 French sheath was advanced into the middle hepatic vein. Using ICE ultrasound visualization a planned exit site from the hepatic vein and portal puncture site were placed into a single sonographic plane. Under direct ultrasound visualization, the ScorpionX needle was advanced into the central left portal  vein. There is moderate redundancy in looping of the wire in the left portal vein prior to entering the main portal vein which required several manipulation techniques to reduce including balloon dilation and insertion of a stiff buddy wire. A 5 French marking pigtail catheter was then advanced over the wire into the superior mesenteric vein and wire removed. Portal venogram was performed which demonstrated occlusive thrombus throughout the central superior mesenteric vein and main portal vein. The tract was then dilated to 8 mm with an 8 mm x 8 cm Athletis balloon. A 8-10 mm by 7 + 2 cm of Viatorr endograft was placed. This was dilated to 8 mm. The indwelling 10 French sheath was then exchanged for a 16 French, 33 cm dry seal sheath. The sheath was directed to the portal aspect of the endograft. Aspiration thrombectomy was then performed in multiple passes over the wire through the main portal vein and into the central superior mesenteric vein. Moderate acute and chronic appearing thrombus were collected in the aspiration canister. A sample was sent for pathology. Repeat portal venogram demonstrated persistent occlusive appearing thrombus in the main portal vein and irregular luminal filling defects in the central superior mesenteric vein. Therefore, balloon angioplasty was performed with a 6 mm x 8 cm Athletis balloon. Repeat portal venogram demonstrated no evidence of extravasation in mildly restored luminal gain in the central superior mesenteric vein. This region was then treated with a 6 mm x 8 cm In.Pact drug coated balloon prolonged inflation for total of 3 minutes. After angioplasty, the central superior mesenteric vein appear patent with inline flow into the indwelling tips endograft. Additional balloon angioplasty through the tips endograft within 8 mm balloon was performed. Repeat portal venogram demonstrated minimal improved patency. Additional aspiration thrombectomy was performed through the tips  endograft which yielded moderate acute appearing thrombus. Repeat portal venogram demonstrated  persistent near occlusive thrombus throughout the indwelling tips endograft in main portal vein. Therefore, mechanical thrombectomy was performed through the tips endograft and main portal vein into the central superior mesenteric vein with a 6 [place] cleaner device undergoing multiple passes. Repeat portal venogram demonstrated improved patency and inline flow via the indwelling tips, however with multiple irregular luminal filling defects and sluggish flow. Retrograde flow was noted into the left gastric vein which filled multiple small gastroesophageal varices. The indwelling tips endograft was then dilated with a 10 mm x 80 mm Athletis balloon. Repeat portal venogram demonstrated improved patency and antegrade flow through the indwelling endograft. There is persistent retrograde filling of the left gastric vein. Therefore, a 5 French C2 catheter was used to select the left gastric vein. Left gastric venogram demonstrated retrograde flow with opacification and irregular vascularity about multifocal small gastroesophageal varices. Therefore, coil embolization was performed about the central left gastric vein with 2, 6 mm 0.035 "Azur detachable coils. Completion portal venogram demonstrated successfully embolized left gastric vein with antegrade flow via patent main portal vein in newly placed TIPS endograft, however several scattered filling defects remain in the main portal vein and in the endograft. The catheters and sheath were removed and manual compression was applied to the right internal jugular and right greater saphenous venous access sites until hemostasis was achieved. The patient was administered 60 mg of subcutaneous Lovenox. The patient was transferred to the PACU in stable condition. IMPRESSION: 1. 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 2. Extensive acute and subacute thrombus  throughout the central superior mesenteric vein, main portal vein, and bilateral intrahepatic portal veins. 3. Technically successful aspiration and mechanical thrombectomy of the superior mesenteric and main portal veins. 4. Technically successful plain and drug coated balloon angioplasty of the superior mesenteric and main portal veins. 5. Technically successful coil embolization of the left gastric vein. PLAN: Begin therapeutic Lovenox for 1 month prior to transitioning back to oral anticoagulation. IR will follow while inpatient and arrange outpatient followup within 1 month after discharge. Marliss Coots, MD [facility] Radiology Electronically Signed   By: Marliss Coots M.D.   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 EXAM: 1. Ultrasound-guided access of the right internal jugular vein 2. Ultrasound-guided access of the right greater saphenous vein 3. Hepatic venogram 4. Intravascular ultrasound 5. Catheterization of the portal vein 6. Portal venogram 7. Creation of a transhepatic portal vein to hepatic vein shunt 8. Aspiration thrombectomy of the portal vein and superior mesenteric vein 9. Mechanical thrombectomy of the portal vein and superior mesenteric vein 10. Drug coated balloon angioplasty of the main portal vein and superior mesenteric vein 11. Embolization of left gastric vein MEDICATIONS: As antibiotic prophylaxis, Rocephin 1 gm IV was ordered pre-procedure and administered intravenously within one hour of incision. ANESTHESIA/SEDATION: General - as administered by the Anesthesia department CONTRAST:  One hundred ML Omnipaque 300, intravenous FLUOROSCOPY TIME:  2,580 mGy COMPLICATIONS: None immediate. PROCEDURE: The procedure was  performed in concert with my partner Dr. Katherina Right. Informed written consent was obtained from the patient after a thorough discussion of the procedural risks, benefits and alternatives. All questions were addressed. Maximal Sterile Barrier Technique was utilized including caps, mask, sterile gowns, sterile gloves, sterile drape, hand hygiene and skin antiseptic. A timeout was  performed prior to the initiation of the procedure. A preliminary ultrasound of the right groin was performed and demonstrates a patent right common femoral vein and central greater saphenous vein. A permanent ultrasound image was recorded. Using a combination of fluoroscopy and ultrasound, an access site was determined. A small dermatotomy was made at the planned puncture site. Using ultrasound guidance, access into the right greater saphenous vein was obtained with visualization of needle entry into the vessel using a standard micropuncture technique. A wire was advanced into the IVC insert all fascial dilation performed. An 8 [place], 11 cm vascular sheath was placed into the external iliac vein. Through this access site, an 60 [place] ICE catheter was advanced with ease under fluoroscopic guidance to the level of the intrahepatic inferior vena cava. A preliminary ultrasound of the right neck was performed and demonstrates a patent internal jugular vein. A permanent ultrasound image was recorded. Using a combination of fluoroscopy and ultrasound, an access site was determined. A small dermatotomy was made at the planned puncture site. Using ultrasound guidance, access into the right internal jugular vein was obtained with visualization of needle entry into the vessel using a standard micropuncture technique. A wire was advanced into the IVC and serial fascial dilation performed. A 10 French tips sheath was placed into the internal jugular vein and advanced to the IVC. The jugular sheath was retracted into the right atrium and manometry  was performed measuring a mean pressure of 11 mmHg. A 5 French angled tip catheter was then directed into the right hepatic vein. Hepatic venogram was performed. These images demonstrated patent hepatic vein with no stenosis. The catheter was advanced to a wedge portion of the a patent vein over which the 10 French sheath was advanced into the right hepatic vein. Using ICE ultrasound visualization the catheter as right hepatic vein as well as the portal anatomy was defined. There was acute appearing, mildly expansile heterogeneously hypoechoic thrombus throughout the portal veins. A planned exit site from the hepatic vein and puncture site from the portal vein was placed into a single sonographic plane. Under direct ultrasound visualization, the ScorpionX needle was advanced into the central right portal vein. A Glidewire Advantage was then advanced however coursed laterally in appeared to follow and extrahepatic location. Upon manipulating to preserve portal venous access, hepatic vein access was lost with the base sheath. Therefore, the jugular sheath was then retracted into the right atrium and a 5 French angled tip catheter was directed into the middle hepatic vein. Hepatic venogram was performed. These images demonstrated patent hepatic vein with no stenosis. The catheter was advanced to a wedged portion of the patent vein over which the 10 French sheath was advanced into the middle hepatic vein. Using ICE ultrasound visualization a planned exit site from the hepatic vein and portal puncture site were placed into a single sonographic plane. Under direct ultrasound visualization, the ScorpionX needle was advanced into the central left portal vein. There is moderate redundancy in looping of the wire in the left portal vein prior to entering the main portal vein which required several manipulation techniques to reduce including balloon dilation and insertion of a stiff buddy wire. A 5 French marking pigtail  catheter was then advanced over the wire into the superior mesenteric vein and wire removed. Portal venogram was performed which demonstrated occlusive thrombus throughout the central superior mesenteric vein and main portal vein. The tract was then dilated to 8 mm with an 8 mm x 8 cm  Athletis balloon. A 8-10 mm by 7 + 2 cm of Viatorr endograft was placed. This was dilated to 8 mm. The indwelling 10 French sheath was then exchanged for a 16 French, 33 cm dry seal sheath. The sheath was directed to the portal aspect of the endograft. Aspiration thrombectomy was then performed in multiple passes over the wire through the main portal vein and into the central superior mesenteric vein. Moderate acute and chronic appearing thrombus were collected in the aspiration canister. A sample was sent for pathology. Repeat portal venogram demonstrated persistent occlusive appearing thrombus in the main portal vein and irregular luminal filling defects in the central superior mesenteric vein. Therefore, balloon angioplasty was performed with a 6 mm x 8 cm Athletis balloon. Repeat portal venogram demonstrated no evidence of extravasation in mildly restored luminal gain in the central superior mesenteric vein. This region was then treated with a 6 mm x 8 cm In.Pact drug coated balloon prolonged inflation for total of 3 minutes. After angioplasty, the central superior mesenteric vein appear patent with inline flow into the indwelling tips endograft. Additional balloon angioplasty through the tips endograft within 8 mm balloon was performed. Repeat portal venogram demonstrated minimal improved patency. Additional aspiration thrombectomy was performed through the tips endograft which yielded moderate acute appearing thrombus. Repeat portal venogram demonstrated persistent near occlusive thrombus throughout the indwelling tips endograft in main portal vein. Therefore, mechanical thrombectomy was performed through the tips endograft and  main portal vein into the central superior mesenteric vein with a 6 [place] cleaner device undergoing multiple passes. Repeat portal venogram demonstrated improved patency and inline flow via the indwelling tips, however with multiple irregular luminal filling defects and sluggish flow. Retrograde flow was noted into the left gastric vein which filled multiple small gastroesophageal varices. The indwelling tips endograft was then dilated with a 10 mm x 80 mm Athletis balloon. Repeat portal venogram demonstrated improved patency and antegrade flow through the indwelling endograft. There is persistent retrograde filling of the left gastric vein. Therefore, a 5 French C2 catheter was used to select the left gastric vein. Left gastric venogram demonstrated retrograde flow with opacification and irregular vascularity about multifocal small gastroesophageal varices. Therefore, coil embolization was performed about the central left gastric vein with 2, 6 mm 0.035 "Azur detachable coils. Completion portal venogram demonstrated successfully embolized left gastric vein with antegrade flow via patent main portal vein in newly placed TIPS endograft, however several scattered filling defects remain in the main portal vein and in the endograft. The catheters and sheath were removed and manual compression was applied to the right internal jugular and right greater saphenous venous access sites until hemostasis was achieved. The patient was administered 60 mg of subcutaneous Lovenox. The patient was transferred to the PACU in stable condition. IMPRESSION: 1. 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 2. 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 3. Technically successful aspiration and mechanical thrombectomy of the superior mesenteric and main portal veins. 4. Technically successful plain and drug coated  balloon angioplasty of the superior mesenteric and main portal veins. 5. Technically successful coil embolization of the left gastric vein. PLAN: Begin therapeutic Lovenox for 1 month prior to transitioning back to oral anticoagulation. IR will follow while inpatient and arrange outpatient followup within 1 month after discharge. Marliss Coots, MD [facility] Radiology Electronically Signed   By: Marliss Coots M.D.   On: 02/09/2023 22:10  IR EMBO VENOUS NOT HEMORR HEMANG  INC GUIDE ROADMAPPING Result Date: 02/09/2023 CLINICAL DATA:  78 year old female with history of pancreatic cancer status post radiation with development of progressive acute portal vein thrombus and recurrent gastric hemorrhage. EXAM: 1. Ultrasound-guided access of the right internal jugular vein 2. Ultrasound-guided access of the right greater saphenous vein 3. Hepatic venogram 4. Intravascular ultrasound 5. Catheterization of the portal vein 6. Portal venogram 7. Creation of a transhepatic portal vein to hepatic vein shunt 8. Aspiration thrombectomy of the portal vein and superior mesenteric vein 9. Mechanical thrombectomy of the portal vein and superior mesenteric vein 10. Drug coated balloon angioplasty of the main portal vein and superior mesenteric vein 11. Embolization of left gastric vein MEDICATIONS: As antibiotic prophylaxis, Rocephin 1 gm IV was ordered pre-procedure and administered intravenously within one hour of incision. ANESTHESIA/SEDATION: General - as administered by the Anesthesia department CONTRAST:  One hundred ML Omnipaque 300, intravenous FLUOROSCOPY TIME:  2,580 mGy COMPLICATIONS: None immediate. PROCEDURE: The procedure was performed in concert with my partner Dr. Katherina Right. Informed written consent was obtained from the patient after a thorough discussion of the procedural risks, benefits and alternatives. All questions were addressed. Maximal Sterile Barrier Technique  was utilized including caps, mask, sterile gowns, sterile gloves, sterile drape, hand hygiene and skin antiseptic. A timeout was performed prior to the initiation of the procedure. A preliminary ultrasound of the right groin was performed and demonstrates a patent right common femoral vein and central greater saphenous vein. A permanent ultrasound image was recorded. Using a combination of fluoroscopy and ultrasound, an access site was determined. A small dermatotomy was made at the planned puncture site. Using ultrasound guidance, access into the right greater saphenous vein was obtained with visualization of needle entry into the vessel using a standard micropuncture technique. A wire was advanced into the IVC insert all fascial dilation performed. An 8 [place], 11 cm vascular sheath was placed into the external iliac vein. Through this access site, an 80 [place] ICE catheter was advanced with ease under fluoroscopic guidance to the level of the intrahepatic inferior vena cava. A preliminary ultrasound of the right neck was performed and demonstrates a patent internal jugular vein. A permanent ultrasound image was recorded. Using a combination of fluoroscopy and ultrasound, an access site was determined. A small dermatotomy was made at the planned puncture site. Using ultrasound guidance, access into the right internal jugular vein was obtained with visualization of needle entry into the vessel using a standard micropuncture technique. A wire was advanced into the IVC and serial fascial dilation performed. A 10 French tips sheath was placed into the internal jugular vein and advanced to the IVC. The jugular sheath was retracted into the right atrium and manometry was performed measuring a mean pressure of 11 mmHg. A 5 French angled tip catheter was then directed into the right hepatic vein. Hepatic venogram was performed. These images demonstrated patent hepatic vein with no stenosis. The catheter was  advanced to a wedge portion of the a patent vein over which the 10 French sheath was advanced into the right hepatic vein. Using ICE ultrasound visualization the catheter as right hepatic vein as well as the portal anatomy was defined. There was acute appearing, mildly expansile heterogeneously hypoechoic thrombus throughout the portal veins. A planned exit site from the hepatic vein and puncture site from the portal vein was placed into a single sonographic plane. Under direct ultrasound visualization, the ScorpionX needle was advanced into the  central right portal vein. A Glidewire Advantage was then advanced however coursed laterally in appeared to follow and extrahepatic location. Upon manipulating to preserve portal venous access, hepatic vein access was lost with the base sheath. Therefore, the jugular sheath was then retracted into the right atrium and a 5 French angled tip catheter was directed into the middle hepatic vein. Hepatic venogram was performed. These images demonstrated patent hepatic vein with no stenosis. The catheter was advanced to a wedged portion of the patent vein over which the 10 French sheath was advanced into the middle hepatic vein. Using ICE ultrasound visualization a planned exit site from the hepatic vein and portal puncture site were placed into a single sonographic plane. Under direct ultrasound visualization, the ScorpionX needle was advanced into the central left portal vein. There is moderate redundancy in looping of the wire in the left portal vein prior to entering the main portal vein which required several manipulation techniques to reduce including balloon dilation and insertion of a stiff buddy wire. A 5 French marking pigtail catheter was then advanced over the wire into the superior mesenteric vein and wire removed. Portal venogram was performed which demonstrated occlusive thrombus throughout the central superior mesenteric vein and main portal vein. The tract was then  dilated to 8 mm with an 8 mm x 8 cm Athletis balloon. A 8-10 mm by 7 + 2 cm of Viatorr endograft was placed. This was dilated to 8 mm. The indwelling 10 French sheath was then exchanged for a 16 French, 33 cm dry seal sheath. The sheath was directed to the portal aspect of the endograft. Aspiration thrombectomy was then performed in multiple passes over the wire through the main portal vein and into the central superior mesenteric vein. Moderate acute and chronic appearing thrombus were collected in the aspiration canister. A sample was sent for pathology. Repeat portal venogram demonstrated persistent occlusive appearing thrombus in the main portal vein and irregular luminal filling defects in the central superior mesenteric vein. Therefore, balloon angioplasty was performed with a 6 mm x 8 cm Athletis balloon. Repeat portal venogram demonstrated no evidence of extravasation in mildly restored luminal gain in the central superior mesenteric vein. This region was then treated with a 6 mm x 8 cm In.Pact drug coated balloon prolonged inflation for total of 3 minutes. After angioplasty, the central superior mesenteric vein appear patent with inline flow into the indwelling tips endograft. Additional balloon angioplasty through the tips endograft within 8 mm balloon was performed. Repeat portal venogram demonstrated minimal improved patency. Additional aspiration thrombectomy was performed through the tips endograft which yielded moderate acute appearing thrombus. Repeat portal venogram demonstrated persistent near occlusive thrombus throughout the indwelling tips endograft in main portal vein. Therefore, mechanical thrombectomy was performed through the tips endograft and main portal vein into the central superior mesenteric vein with a 6 [place] cleaner device undergoing multiple passes. Repeat portal venogram demonstrated improved patency and inline flow via the indwelling tips, however with multiple irregular luminal  filling defects and sluggish flow. Retrograde flow was noted into the left gastric vein which filled multiple small gastroesophageal varices. The indwelling tips endograft was then dilated with a 10 mm x 80 mm Athletis balloon. Repeat portal venogram demonstrated improved patency and antegrade flow through the indwelling endograft. There is persistent retrograde filling of the left gastric vein. Therefore, a 5 French C2 catheter was used to select the left gastric vein. Left gastric venogram demonstrated retrograde flow with opacification and irregular vascularity about  multifocal small gastroesophageal varices. Therefore, coil embolization was performed about the central left gastric vein with 2, 6 mm 0.035 "Azur detachable coils. Completion portal venogram demonstrated successfully embolized left gastric vein with antegrade flow via patent main portal vein in newly placed TIPS endograft, however several scattered filling defects remain in the main portal vein and in the endograft. The catheters and sheath were removed and manual compression was applied to the right internal jugular and right greater saphenous venous access sites until hemostasis was achieved. The patient was administered 60 mg of subcutaneous Lovenox. The patient was transferred to the PACU in stable condition. IMPRESSION: 1. 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 2. 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 3. Technically successful aspiration and mechanical thrombectomy of the superior mesenteric and main portal veins. 4. Technically successful plain and drug coated balloon angioplasty of the superior mesenteric and main portal veins. 5. Technically successful coil embolization of the left gastric vein. PLAN: Begin therapeutic Lovenox for 1 month prior to transitioning back to oral anticoagulation. IR will follow  while inpatient and arrange outpatient followup within 1 month after discharge. Marliss Coots, MD [facility] Radiology Electronically Signed   By: Marliss Coots M.D.   On: 02/09/2023 22:10    Medications: I have reviewed the patient's current medications. Scheduled:  buPROPion  300 mg Oral QPC breakfast   busPIRone  20 mg Oral BID   Chlorhexidine Gluconate Cloth  6 each Topical Daily   cholecalciferol  5,000 Units Oral q morning   enoxaparin (LOVENOX) injection  1 mg/kg Subcutaneous Q12H   escitalopram  20 mg Oral QHS   linaclotide  72 mcg Oral QAC breakfast   mirtazapine  15 mg Oral QHS   pantoprazole (PROTONIX) IV  40 mg Intravenous Q12H   QUEtiapine  25 mg Oral BID   sodium chloride flush  10-40 mL Intracatheter Q12H   sodium chloride flush  10-40 mL Intracatheter Q12H   sucralfate  1 g Oral TID WC & HS   Continuous: [id number], morphine injection, sodium chloride  Assessment/Plan: 78 year old female with history of PE on Eliquis, IDA, AVMs, pancreatic adenocarcinoma status post chemoradiation, PVT, non-occlusive SMV thrombus presented with melena and anemia.  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  Patient underwent EGD 02/06/22 that showed bleeding GAVE that was treated with APC and non-bleeding gastric ulcers.  Patient received TIPS, partial thrombectomy and balloon angioplasty of the portal vein and SMV, and embolization of the left gastric vein on 1/15.  Patient has developed some pain after the procedure and has some drop in her hemoglobin.  Per radiology some pain after procedure such as this is normal and that her hemoglobin drop can be explained by blood loss during the procedure.  Patient is getting two units of PRBCs today. -Continue to trend hemoglobin -Continue PPI twice daily and sucralfate 4 times daily -Will start on MiraLAX daily while she is  getting pain medications -Patient is currently on Lovenox   LOS: 4 days   Imogene Burn MD 02/10/2023, 7:13 PM

## 2023-02-10 NOTE — Progress Notes (Signed)
   02/10/23 1045  Mobility  Activity Refused mobility  Mobility Specialist Start Time (ACUTE ONLY) 1045  Mobility Specialist Stop Time (ACUTE ONLY) 1047  Mobility Specialist Time Calculation (min) (ACUTE ONLY) 2 min   Mobility Specialist: Progress Note  Pt refused mobility session d/t "wanting to take a nap before mobilizing with PT" - Received and left in bed with all needs met. Call bell within reach. MS will follow up as time permits.   Barnie Mort, BS Mobility Specialist Please contact via SecureChat or Rehab office at [phone].

## 2023-02-11 ENCOUNTER — Telehealth (HOSPITAL_COMMUNITY): Payer: Self-pay | Admitting: Pharmacy Technician

## 2023-02-11 ENCOUNTER — Other Ambulatory Visit (HOSPITAL_COMMUNITY): Payer: Self-pay

## 2023-02-11 DIAGNOSIS — K2901 Acute gastritis with bleeding: Secondary | ICD-10-CM | POA: Diagnosis not present

## 2023-02-11 LAB — COMPREHENSIVE METABOLIC PANEL
ALT: 64 U/L — ABNORMAL HIGH (ref 0–44)
AST: 48 U/L — ABNORMAL HIGH (ref 15–41)
Albumin: 2.3 g/dL — ABNORMAL LOW (ref 3.5–5.0)
Alkaline Phosphatase: 84 U/L (ref 38–126)
Anion gap: 7 (ref 5–15)
BUN: 15 mg/dL (ref 8–23)
CO2: 22 mmol/L (ref 22–32)
Calcium: 7.7 mg/dL — ABNORMAL LOW (ref 8.9–10.3)
Chloride: 108 mmol/L (ref 98–111)
Creatinine, Ser: 0.94 mg/dL (ref 0.44–1.00)
GFR, Estimated: >60 mL/min (ref >60)
Glucose, Bld: 99 mg/dL (ref 70–99)
Potassium: 3.7 mmol/L (ref 3.5–5.1)
Sodium: 137 mmol/L (ref 135–145)
Total Bilirubin: 0.8 mg/dL (ref 0.0–1.2)
Total Protein: 4.7 g/dL — ABNORMAL LOW (ref 6.5–8.1)

## 2023-02-11 LAB — CBC
HCT: 28.6 % — ABNORMAL LOW (ref 36.0–46.0)
Hemoglobin: 9.4 g/dL — ABNORMAL LOW (ref 12.0–15.0)
MCH: 29.2 pg (ref 26.0–34.0)
MCHC: 32.9 g/dL (ref 30.0–36.0)
MCV: 88.8 fL (ref 80.0–100.0)
Platelets: 141 10*3/uL — ABNORMAL LOW (ref 150–400)
RBC: 3.22 MIL/uL — ABNORMAL LOW (ref 3.87–5.11)
RDW: 18.1 % — ABNORMAL HIGH (ref 11.5–15.5)
WBC: 6.8 10*3/uL (ref 4.0–10.5)
nRBC: 0 % (ref 0.0–0.2)

## 2023-02-11 LAB — CBC WITH DIFFERENTIAL/PLATELET
Abs Immature Granulocytes: 0.02 10*3/uL (ref 0.00–0.07)
Basophils Absolute: 0 10*3/uL (ref 0.0–0.1)
Basophils Relative: 0 %
Eosinophils Absolute: 0 10*3/uL (ref 0.0–0.5)
Eosinophils Relative: 1 %
HCT: 25.5 % — ABNORMAL LOW (ref 36.0–46.0)
Hemoglobin: 8.2 g/dL — ABNORMAL LOW (ref 12.0–15.0)
Immature Granulocytes: 0 %
Lymphocytes Relative: 14 %
Lymphs Abs: 0.7 10*3/uL (ref 0.7–4.0)
MCH: 28.9 pg (ref 26.0–34.0)
MCHC: 32.2 g/dL (ref 30.0–36.0)
MCV: 89.8 fL (ref 80.0–100.0)
Monocytes Absolute: 0.6 10*3/uL (ref 0.1–1.0)
Monocytes Relative: 12 %
Neutro Abs: 3.7 10*3/uL (ref 1.7–7.7)
Neutrophils Relative %: 73 %
Platelets: 113 10*3/uL — ABNORMAL LOW (ref 150–400)
RBC: 2.84 MIL/uL — ABNORMAL LOW (ref 3.87–5.11)
RDW: 18.2 % — ABNORMAL HIGH (ref 11.5–15.5)
WBC: 5.1 10*3/uL (ref 4.0–10.5)
nRBC: 0 % (ref 0.0–0.2)

## 2023-02-11 LAB — BRAIN NATRIURETIC PEPTIDE: B Natriuretic Peptide: 27.4 pg/mL (ref 0.0–100.0)

## 2023-02-11 LAB — MAGNESIUM: Magnesium: 2.1 mg/dL (ref 1.7–2.4)

## 2023-02-11 MED ORDER — SIMETHICONE 80 MG PO CHEW
80.0000 mg | CHEWABLE_TABLET | Freq: Four times a day (QID) | ORAL | Status: DC | PRN
  Administered 2023-02-11 – 2023-02-12 (×2): 80 mg via ORAL
  Filled 2023-02-11 (×2): qty 1

## 2023-02-11 NOTE — Progress Notes (Signed)
Gastroenterology Inpatient Follow Up    Subjective: Patient's abdominal pain appears to be improving.  She feels much better today compared to yesterday.  Had a BM today.  Objective: Vital signs in last 24 hours: Temp:  [98.2 F (36.8 C)-98.6 F (37 C)] 98.2 F (36.8 C) (01/17 0758) Pulse Rate:  [84-111] 111 (01/17 1200) Resp:  [13-21] 13 (01/17 1200) BP: (98-112)/(53-67) 110/67 (01/17 1200) SpO2:  [93 %-96 %] 96 % (01/17 1200) Last BM Date : 02/09/23  Intake/Output from previous day: 01/16 0701 - 01/17 0700 In: 622.5 [Blood:622.5] Out: 900 [Urine:900] Intake/Output this shift: Total I/O In: -  Out: 1056 [Urine:1056]  General appearance: alert and cooperative Resp: No increased WOB Cardio: Regular rate GI: nondistended Extremities: No BLE edema  Lab Results: Recent Labs    02/10/23 2031 02/11/23 0406 02/11/23 1336  WBC 8.2 5.1 6.8  HGB 8.6* 8.2* 9.4*  HCT 26.7* 25.5* 28.6*  PLT 129* 113* 141*   BMET Recent Labs    02/09/23 0404 02/10/23 0040 02/11/23 0406  NA 141 140 137  K 3.6 3.5 3.7  CL 111 108 108  CO2 22 22 22   GLUCOSE 109* 130* 99  BUN 9 14 15   CREATININE 0.84 0.93 0.94  CALCIUM 8.5* 8.0* 7.7*   LFT Recent Labs    02/11/23 0406  PROT 4.7*  ALBUMIN 2.3*  AST 48*  ALT 64*  ALKPHOS 84  BILITOT 0.8   PT/INR Recent Labs    02/09/23 0404  LABPROT 13.9  INR 1.1   Hepatitis Panel No results for input(s): "HEPBSAG", "HCVAB", "HEPAIGM", "HEPBIGM" in the last 72 hours. C-Diff No results for input(s): "CDIFFTOX" in the last 72 hours.  Studies/Results: No results found.   Medications: I have reviewed the patient's current medications. Scheduled:  buPROPion  300 mg Oral QPC breakfast   busPIRone  20 mg Oral BID   Chlorhexidine Gluconate Cloth  6 each Topical Daily   cholecalciferol  5,000 Units Oral q morning   enoxaparin (LOVENOX) injection  1 mg/kg Subcutaneous Q12H   escitalopram  20 mg Oral QHS   linaclotide  72 mcg Oral  QAC breakfast   mirtazapine  15 mg Oral QHS   pantoprazole (PROTONIX) IV  40 mg Intravenous Q12H   polyethylene glycol  17 g Oral Daily   QUEtiapine  25 mg Oral BID   sodium chloride flush  10-40 mL Intracatheter Q12H   sodium chloride flush  10-40 mL Intracatheter Q12H   sucralfate  1 g Oral TID WC & HS   Continuous: [id number], morphine injection, simethicone, sodium chloride  Assessment/Plan: 78 year old female with history of PE on Eliquis, IDA, AVMs, pancreatic adenocarcinoma status post chemoradiation, PVT, non-occlusive SMV thrombus presented with melena and anemia.  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  Patient underwent EGD 02/06/22 that showed bleeding GAVE that was treated with APC and non-bleeding gastric ulcers.  Patient received TIPS, partial thrombectomy and balloon angioplasty of the portal vein and SMV, and embolization of the left gastric vein on 1/15.  Patient's abdominal pain after this procedure appears to be improving.  Her hemoglobin has stabilized after the initial postprocedural drop.  Patient is overall feeling much better today. -Continue to trend hemoglobin -Continue PPI twice daily and sucralfate 4 times daily -Continue MiraLAX -Advanced to heart healthy diet -Gas-X as needed -Patient is currently on Lovenox -GI will sign off for now.  Please call back if any new  questions arise.  Patient can follow-up with [facility]   LOS: 5 days   Imogene Burn MD 02/11/2023, 6:18 PM

## 2023-02-11 NOTE — Progress Notes (Signed)
Patient is alert, awake, resting quietly. Pain is controled with  1 tablet Norco. PT was given 17 g  Miralax last night .

## 2023-02-11 NOTE — Progress Notes (Signed)
Patient with palliative team, sitting in recliner and not in distress.   VSS Labs stable LFT improving  On Lovenox 60 mg BID  Plan for 1 month f/u with Dr. Elby Showers as outpatient   Please call IR for questions and concerns.   Lynann Bologna [place] Lamons PA-C 02/11/2023 3:33 PM

## 2023-02-11 NOTE — Telephone Encounter (Signed)
Patient Product/process development scientist completed.    The patient is insured through [employer]. Patient has Medicare and is not eligible for a copay card, but may be able to apply for patient assistance or Medicare RX Payment Plan (Patient Must reach out to their plan, if eligible for payment plan), if available.    Ran test claim for enoxaparin (Lovenox) 60 mg/0.6 ml inj and the current 30 day co-pay is $12.50.   This test claim was processed through [facility]- copay amounts may vary at other pharmacies due to pharmacy/plan contracts, or as the patient moves through the different stages of their insurance plan.     Roland Earl, CPHT Pharmacy Technician III Certified Patient Advocate [facility] Patient Advocate Team Direct Number: [phone]  Fax: [phone]

## 2023-02-11 NOTE — Progress Notes (Addendum)
PROGRESS NOTE                                                                                                                                                                                                             Patient Demographics:    Sheryl Bender, is a 78 y.o. female, DOB - [birth year], WUJ:[id number]  Outpatient Primary MD for the patient is [name], DO    LOS - 5  Admit date - 02/06/2023    Chief Complaint  Patient presents with   Abdominal Pain   Weakness       Brief Narrative (HPI from H&P)   78 y.o. female with medical history significant of Stage 1 pancreatic cancer s/p chemoradiation, pulmonary embolism on Eliquis, iron-deficiency anemia, interstitial pancreatitis, HTN, recurrent GI bleed who presents with melena and weakness.    Patient hospitalized from 12/2-12/5 with GI bleed requiring transfusion. 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  Colonoscopy with no active bleeding, minimal amount of old blood in the cecum, 6 mm polyp removed from mid sigmoid colon.    She then resumed Eliquis on 12/11 and was re-admitted from 12/18-12/21 with recurrent GI bleed requiring transfusion. EGD found to have hemorrhagic gastritis, treated with APC, bipolar cautery.  GAVE is also possibility.  GI recommended PPI twice daily for 4 weeks then daily indefinitely. Asked to resume Eliquis on 12/28.   Hospitalized again 1/4 -1/8 for GI bleed. EGD Gastritis with hemorrhage. Treated with argon plasma coagulation (APC). Non-bleeding gastric ulcers with no stigmata of bleeding. Duodenitis. CT abdomen and pelvis with contrast-question of the nonocclusive thrombus in the SMV but most significant thrombus not seen in the portal vein including the main portal vein. low-density masslike area along the uncinate process with associated severe ductal dilatation in  caliber change. Plan was for IR thrombectomy on 1/21 and to hold any systemic anticoagulation.   She now presented to the ER on 02/06/2023 for reoccurrence of GI bleed.   Subjective:   Patient in bed, appears comfortable, denies any headache, no fever, no chest pain or pressure, no shortness of breath , improved abdominal pain. No focal weakness.   Assessment  & Plan :    Recurrent upper GI bleed fourth episode in the last 4 weeks, bleeding likely being caused  by.  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  Seen by GI underwent EGD on 02/07/2023 by Dr. Leonides Schanz, found bleeding GAVE in the antrum treated with APC, also several Forrest class III ulcers in gastric antrum.  On PPI, Carafate, liquid diet, GI on board.  She underwent TIPS procedure, aspiration of portal vein thrombus, SMV balloon angioplasty along with embolization of left gastric vein by IR on 02/09/2023.  Has been placed on full anticoagulation with Lovenox by IR postprocedure on 02/09/2023.  Has some postprocedure pain.  Seen by IR again on 02/10/2023.  GI and oncology will see her again on 02/10/2023 and appoint.  Patient has had issues with recurrent GI bleed as above, post TIPS procedure on 02/09/2023 H&H has dropped some, some blood loss can be expected from the TIPS procedure itself,.  Monitor CBC for another 24 hours.  Her postop abdominal discomfort and pain have improved, overall she is feeling better.    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 CT abdomen shows progressive thrombus in SMV into portal vein.  Kindly see above.  Patient's pancreatic cancer was being treated at [facility] by  [name], was currently being monitored off of chemotherapy for the last 3 months, she also went to [facility] for a Whipple's procedure but it could  not be done due to the location of her tumor.  Unfortunately CA 19.9 is now trending up and currently quite high, case discussed with [name] oncologist on 02/09/2023, also discussed her case with her primary oncologist [name] on 02/10/2023.  Plan is to monitor with supportive care, if she stabilizes then discharge her home with outpatient follow-up with her oncologist in 1 week, clinically she is improving.  Will have her follow-up with her oncologist for long-term goals of care discussion and plan.  Malignant neoplasm of pancreas (HCC) CA19-9 has increased from normal 4 months ago to 220 this week.  Kindly see above  Essential hypertension BP normal, diet controlled  Depression - Continue bupropion, Buspar, Lexapro and mirtazapine       Condition - Extremely Guarded  Family Communication  : Discussed with patient's son [name] [phone]  on 02/08/2023 at 10:15 AM, called son on 02/10/2023 at 10:20 AM and message left  Code Status : Full code  Consults  : GI, IR, oncology, palliative care  PUD Prophylaxis : PPI   Procedures  :     TIPS procedure, aspiration of portal vein thrombus, SMV balloon angioplasty along with embolization of left gastric vein by IR on 02/09/2023  CT - 1. Progressive thrombus within the superior mesenteric vein over the last 8 days, now occlusive. Progressive thrombus within the main and intrahepatic portal veins. 2. Known hypodense pancreatic mass is grossly unchanged measuring 2.2 cm. This is less well-defined than on prior exam. 3. Peripancreatic fat stranding is mild. 4. Persistent and progressive wall thickening about the distal stomach, duodenum, and hepatic flexure of the colon. Progressive right upper quadrant fat stranding. Etiology of bowel wall thickening and inflammation is indeterminate. 5. Increasing portosystemic collaterals. Paraesophageal varices. Aortic Atherosclerosis (ICD10-I70.0)      Disposition Plan  :    Status is:  Inpatient   DVT Prophylaxis  :    SCDs Start: 02/06/23 1929   Lab Results  Component Value Date   PLT 113 (L) 02/11/2023  Diet :  Diet Order             Diet clear liquid Room service appropriate? Yes; Fluid consistency: Thin  Diet effective now                    Inpatient Medications  Scheduled Meds:  buPROPion  300 mg Oral QPC breakfast   busPIRone  20 mg Oral BID   Chlorhexidine Gluconate Cloth  6 each Topical Daily   cholecalciferol  5,000 Units Oral q morning   enoxaparin (LOVENOX) injection  1 mg/kg Subcutaneous Q12H   escitalopram  20 mg Oral QHS   linaclotide  72 mcg Oral QAC breakfast   mirtazapine  15 mg Oral QHS   pantoprazole (PROTONIX) IV  40 mg Intravenous Q12H   polyethylene glycol  17 g Oral Daily   QUEtiapine  25 mg Oral BID   sodium chloride flush  10-40 mL Intracatheter Q12H   sodium chloride flush  10-40 mL Intracatheter Q12H   sucralfate  1 g Oral TID WC & HS   Continuous Infusions:   PRN Meds:.HYDROcodone-acetaminophen, morphine injection, sodium chloride  Antibiotics  :    Anti-infectives (From admission, onward)    Start     Dose/Rate Route Frequency Ordered Stop   02/09/23 1045  cefTRIAXone (ROCEPHIN) 2 g in sodium chloride 0.9 % 100 mL IVPB        2 g 200 mL/hr over 30 Minutes Intravenous To Radiology 02/08/23 1525 02/09/23 1159         Objective:   Vitals:   02/10/23 2015 02/10/23 2336 02/11/23 0344 02/11/23 0758  BP: (!) 110/57 112/63 (!) 106/55 (!) 105/53  Pulse:  89 84 88  Resp: 17 (!) 21 18 19   Temp: 98.5 F (36.9 C) 98.6 F (37 C) 98.3 F (36.8 C) 98.2 F (36.8 C)  TempSrc: Oral Oral Oral Oral  SpO2:  93% 95% 94%  Weight:      Height:        Wt Readings from Last 3 Encounters:  02/09/23 59 kg  02/03/23 58.6 kg  01/29/23 59.9 kg     Intake/Output Summary (Last 24 hours) at 02/11/2023 0914 Last data filed at 02/10/2023 2000 Gross per 24 hour  Intake 622.5 ml  Output 900 ml  Net -277.5 ml      Physical Exam  Awake Alert, No new F.N deficits, Normal affect [place].AT,PERRAL Supple Neck, No JVD,   Symmetrical Chest wall movement, Good air movement bilaterally, CTAB RRR,No Gallops,Rubs or new Murmurs,  +ve B.Sounds, Abd Soft, No tenderness,   No Cyanosis, Clubbing or edema       Data Review:    Recent Labs  Lab 02/09/23 0404 02/10/23 0040 02/10/23 1147 02/10/23 2031 02/11/23 0406  WBC 3.7* 8.2 8.6 8.2 5.1  HGB 8.1* 7.1* 6.7* 8.6* 8.2*  HCT 26.1* 22.4* 20.9* 26.7* 25.5*  PLT 157 171 134* 129* 113*  MCV 94.2 95.7 94.1 89.6 89.8  MCH 29.2 30.3 30.2 28.9 28.9  MCHC 31.0 31.7 32.1 32.2 32.2  RDW 16.3* 16.7* 16.6* 18.1* 18.2*  LYMPHSABS 0.6* 0.6*  --   --  0.7  MONOABS 0.6 0.8  --   --  0.6  EOSABS 0.1 0.0  --   --  0.0  BASOSABS 0.0 0.0  --   --  0.0    Recent Labs  Lab 02/06/23 1214 02/07/23 0208 02/08/23 0329 02/09/23 0404 02/10/23 0040 02/11/23 0406  NA 140 139 140 141 140  137  K 3.8 3.6 3.6 3.6 3.5 3.7  CL 111 111 112* 111 108 108  CO2 21* 20* 23 22 22 22   ANIONGAP 8 8 5 8 10 7   GLUCOSE 155* 114* 136* 109* 130* 99  BUN 24* 17 9 9 14 15   CREATININE 0.82 0.74 0.73 0.84 0.93 0.94  AST 34  --  23 22 91* 48*  ALT 40  --  28 27 82* 64*  ALKPHOS 110  --  89 100 89 84  BILITOT 0.4  --  0.6 0.6 0.7 0.8  ALBUMIN 3.0*  --  2.5* 2.6* 2.7* 2.3*  INR  --   --   --  1.1  --   --   BNP  --   --   --  16.5 57.1 27.4  MG  --   --   --  2.1 2.1 2.1  CALCIUM 9.0 8.5* 8.4* 8.5* 8.0* 7.7*      Recent Labs  Lab 02/07/23 0208 02/08/23 0329 02/09/23 0404 02/10/23 0040 02/11/23 0406  INR  --   --  1.1  --   --   BNP  --   --  16.5 57.1 27.4  MG  --   --  2.1 2.1 2.1  CALCIUM 8.5* 8.4* 8.5* 8.0* 7.7*    --------------------------------------------------------------------------------------------------------------- No results found for: "CHOL", "HDL", "LDLCALC", "LDLDIRECT", "TRIG", "CHOLHDL"  No results found for: "HGBA1C" No results for input(s): "TSH",  "T4TOTAL", "FREET4", "T3FREE", "THYROIDAB" in the last 72 hours. No results for input(s): "VITAMINB12", "FOLATE", "FERRITIN", "TIBC", "IRON", "RETICCTPCT" in the last 72 hours. ------------------------------------------------------------------------------------------------------------------ Cardiac Enzymes No results for input(s): "CKMB", "TROPONINI", "MYOGLOBIN" in the last 168 hours.  Invalid input(s): "CK"  Micro Results No results found for this or any previous visit (from the past 240 hours).  Radiology Reports IR Tips Result Date: 02/09/2023 CLINICAL DATA:  78 year old female with history of pancreatic cancer status post radiation with development of progressive acute portal vein thrombus and recurrent gastric hemorrhage. EXAM: 1. Ultrasound-guided access of the right internal jugular vein 2. Ultrasound-guided access of the right greater saphenous vein 3. Hepatic venogram 4. Intravascular ultrasound 5. Catheterization of the portal vein 6. Portal venogram 7. Creation of a transhepatic portal vein to hepatic vein shunt 8. Aspiration thrombectomy of the portal vein and superior mesenteric vein 9. Mechanical thrombectomy of the portal vein and superior mesenteric vein 10. Drug coated balloon angioplasty of the main portal vein and superior mesenteric vein 11. Embolization of left gastric vein MEDICATIONS: As antibiotic prophylaxis, Rocephin 1 gm IV was ordered pre-procedure and administered intravenously within one hour of incision. ANESTHESIA/SEDATION: General - as administered by the Anesthesia department CONTRAST:  One hundred ML Omnipaque 300, intravenous FLUOROSCOPY TIME:  2,580 mGy COMPLICATIONS: None immediate. PROCEDURE: The procedure was performed in concert with my partner Dr. Katherina Right. Informed written consent was obtained from the patient after a thorough discussion of the procedural risks, benefits and alternatives. All questions were addressed. Maximal Sterile Barrier Technique was  utilized including caps, mask, sterile gowns, sterile gloves, sterile drape, hand hygiene and skin antiseptic. A timeout was performed prior to the initiation of the procedure. A preliminary ultrasound of the right groin was performed and demonstrates a patent right common femoral vein and central greater saphenous vein. A permanent ultrasound image was recorded. Using a combination of fluoroscopy and ultrasound, an access site was determined. A small dermatotomy was made at the planned puncture site. Using ultrasound guidance, access into the right greater  saphenous vein was obtained with visualization of needle entry into the vessel using a standard micropuncture technique. A wire was advanced into the IVC insert all fascial dilation performed. An 8 [place], 11 cm vascular sheath was placed into the external iliac vein. Through this access site, an 2 [place] ICE catheter was advanced with ease under fluoroscopic guidance to the level of the intrahepatic inferior vena cava. A preliminary ultrasound of the right neck was performed and demonstrates a patent internal jugular vein. A permanent ultrasound image was recorded. Using a combination of fluoroscopy and ultrasound, an access site was determined. A small dermatotomy was made at the planned puncture site. Using ultrasound guidance, access into the right internal jugular vein was obtained with visualization of needle entry into the vessel using a standard micropuncture technique. A wire was advanced into the IVC and serial fascial dilation performed. A 10 French tips sheath was placed into the internal jugular vein and advanced to the IVC. The jugular sheath was retracted into the right atrium and manometry was performed measuring a mean pressure of 11 mmHg. A 5 French angled tip catheter was then directed into the right hepatic vein. Hepatic venogram was performed. These images demonstrated patent hepatic vein with no stenosis. The catheter was advanced to  a wedge portion of the a patent vein over which the 10 French sheath was advanced into the right hepatic vein. Using ICE ultrasound visualization the catheter as right hepatic vein as well as the portal anatomy was defined. There was acute appearing, mildly expansile heterogeneously hypoechoic thrombus throughout the portal veins. A planned exit site from the hepatic vein and puncture site from the portal vein was placed into a single sonographic plane. Under direct ultrasound visualization, the ScorpionX needle was advanced into the central right portal vein. A Glidewire Advantage was then advanced however coursed laterally in appeared to follow and extrahepatic location. Upon manipulating to preserve portal venous access, hepatic vein access was lost with the base sheath. Therefore, the jugular sheath was then retracted into the right atrium and a 5 French angled tip catheter was directed into the middle hepatic vein. Hepatic venogram was performed. These images demonstrated patent hepatic vein with no stenosis. The catheter was advanced to a wedged portion of the patent vein over which the 10 French sheath was advanced into the middle hepatic vein. Using ICE ultrasound visualization a planned exit site from the hepatic vein and portal puncture site were placed into a single sonographic plane. Under direct ultrasound visualization, the ScorpionX needle was advanced into the central left portal vein. There is moderate redundancy in looping of the wire in the left portal vein prior to entering the main portal vein which required several manipulation techniques to reduce including balloon dilation and insertion of a stiff buddy wire. A 5 French marking pigtail catheter was then advanced over the wire into the superior mesenteric vein and wire removed. Portal venogram was performed which demonstrated occlusive thrombus throughout the central superior mesenteric vein and main portal vein. The tract was then dilated to  8 mm with an 8 mm x 8 cm Athletis balloon. A 8-10 mm by 7 + 2 cm of Viatorr endograft was placed. This was dilated to 8 mm. The indwelling 10 French sheath was then exchanged for a 16 French, 33 cm dry seal sheath. The sheath was directed to the portal aspect of the endograft. Aspiration thrombectomy was then performed in multiple passes over the wire through the main portal vein and  into the central superior mesenteric vein. Moderate acute and chronic appearing thrombus were collected in the aspiration canister. A sample was sent for pathology. Repeat portal venogram demonstrated persistent occlusive appearing thrombus in the main portal vein and irregular luminal filling defects in the central superior mesenteric vein. Therefore, balloon angioplasty was performed with a 6 mm x 8 cm Athletis balloon. Repeat portal venogram demonstrated no evidence of extravasation in mildly restored luminal gain in the central superior mesenteric vein. This region was then treated with a 6 mm x 8 cm In.Pact drug coated balloon prolonged inflation for total of 3 minutes. After angioplasty, the central superior mesenteric vein appear patent with inline flow into the indwelling tips endograft. Additional balloon angioplasty through the tips endograft within 8 mm balloon was performed. Repeat portal venogram demonstrated minimal improved patency. Additional aspiration thrombectomy was performed through the tips endograft which yielded moderate acute appearing thrombus. Repeat portal venogram demonstrated persistent near occlusive thrombus throughout the indwelling tips endograft in main portal vein. Therefore, mechanical thrombectomy was performed through the tips endograft and main portal vein into the central superior mesenteric vein with a 6 [place] cleaner device undergoing multiple passes. Repeat portal venogram demonstrated improved patency and inline flow via the indwelling tips, however with multiple irregular luminal filling  defects and sluggish flow. Retrograde flow was noted into the left gastric vein which filled multiple small gastroesophageal varices. The indwelling tips endograft was then dilated with a 10 mm x 80 mm Athletis balloon. Repeat portal venogram demonstrated improved patency and antegrade flow through the indwelling endograft. There is persistent retrograde filling of the left gastric vein. Therefore, a 5 French C2 catheter was used to select the left gastric vein. Left gastric venogram demonstrated retrograde flow with opacification and irregular vascularity about multifocal small gastroesophageal varices. Therefore, coil embolization was performed about the central left gastric vein with 2, 6 mm 0.035 "Azur detachable coils. Completion portal venogram demonstrated successfully embolized left gastric vein with antegrade flow via patent main portal vein in newly placed TIPS endograft, however several scattered filling defects remain in the main portal vein and in the endograft. The catheters and sheath were removed and manual compression was applied to the right internal jugular and right greater saphenous venous access sites until hemostasis was achieved. The patient was administered 60 mg of subcutaneous Lovenox. The patient was transferred to the PACU in stable condition. IMPRESSION: 1. 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 2. 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 3. Technically successful aspiration and mechanical thrombectomy of the superior mesenteric and main portal veins. 4. Technically successful plain and drug coated balloon angioplasty of the superior mesenteric and main portal veins. 5. Technically successful coil embolization of the left gastric vein. PLAN: Begin therapeutic Lovenox for 1 month prior to transitioning back to oral anticoagulation. IR will follow while  inpatient and arrange outpatient followup within 1 month after discharge. Marliss Coots, MD [facility] Radiology Electronically Signed   By: Marliss Coots M.D.   On: 02/09/2023 22:10   IR US Guide Vasc Access Right Result Date: 02/09/2023 CLINICAL DATA:  78 year old female with history of pancreatic cancer status post radiation with development of progressive acute portal vein thrombus and recurrent gastric hemorrhage. EXAM: 1. Ultrasound-guided access of the right internal jugular vein 2. Ultrasound-guided access of the right greater saphenous vein 3. Hepatic venogram 4. Intravascular ultrasound 5. Catheterization of the portal  vein 6. Portal venogram 7. Creation of a transhepatic portal vein to hepatic vein shunt 8. Aspiration thrombectomy of the portal vein and superior mesenteric vein 9. Mechanical thrombectomy of the portal vein and superior mesenteric vein 10. Drug coated balloon angioplasty of the main portal vein and superior mesenteric vein 11. Embolization of left gastric vein MEDICATIONS: As antibiotic prophylaxis, Rocephin 1 gm IV was ordered pre-procedure and administered intravenously within one hour of incision. ANESTHESIA/SEDATION: General - as administered by the Anesthesia department CONTRAST:  One hundred ML Omnipaque 300, intravenous FLUOROSCOPY TIME:  2,580 mGy COMPLICATIONS: None immediate. PROCEDURE: The procedure was performed in concert with my partner Dr. Katherina Right. Informed written consent was obtained from the patient after a thorough discussion of the procedural risks, benefits and alternatives. All questions were addressed. Maximal Sterile Barrier Technique was utilized including caps, mask, sterile gowns, sterile gloves, sterile drape, hand hygiene and skin antiseptic. A timeout was performed prior to the initiation of the procedure. A preliminary ultrasound of the right groin was performed and demonstrates a patent right common  femoral vein and central greater saphenous vein. A permanent ultrasound image was recorded. Using a combination of fluoroscopy and ultrasound, an access site was determined. A small dermatotomy was made at the planned puncture site. Using ultrasound guidance, access into the right greater saphenous vein was obtained with visualization of needle entry into the vessel using a standard micropuncture technique. A wire was advanced into the IVC insert all fascial dilation performed. An 8 [place], 11 cm vascular sheath was placed into the external iliac vein. Through this access site, an 72 [place] ICE catheter was advanced with ease under fluoroscopic guidance to the level of the intrahepatic inferior vena cava. A preliminary ultrasound of the right neck was performed and demonstrates a patent internal jugular vein. A permanent ultrasound image was recorded. Using a combination of fluoroscopy and ultrasound, an access site was determined. A small dermatotomy was made at the planned puncture site. Using ultrasound guidance, access into the right internal jugular vein was obtained with visualization of needle entry into the vessel using a standard micropuncture technique. A wire was advanced into the IVC and serial fascial dilation performed. A 10 French tips sheath was placed into the internal jugular vein and advanced to the IVC. The jugular sheath was retracted into the right atrium and manometry was performed measuring a mean pressure of 11 mmHg. A 5 French angled tip catheter was then directed into the right hepatic vein. Hepatic venogram was performed. These images demonstrated patent hepatic vein with no stenosis. The catheter was advanced to a wedge portion of the a patent vein over which the 10 French sheath was advanced into the right hepatic vein. Using ICE ultrasound visualization the catheter as right hepatic vein as well as the portal anatomy was defined. There was acute appearing, mildly expansile  heterogeneously hypoechoic thrombus throughout the portal veins. A planned exit site from the hepatic vein and puncture site from the portal vein was placed into a single sonographic plane. Under direct ultrasound visualization, the ScorpionX needle was advanced into the central right portal vein. A Glidewire Advantage was then advanced however coursed laterally in appeared to follow and extrahepatic location. Upon manipulating to preserve portal venous access, hepatic vein access was lost with the base sheath. Therefore, the jugular sheath was then retracted into the right atrium and a 5 French angled tip catheter was directed into the middle hepatic vein. Hepatic venogram was performed. These images demonstrated  patent hepatic vein with no stenosis. The catheter was advanced to a wedged portion of the patent vein over which the 10 French sheath was advanced into the middle hepatic vein. Using ICE ultrasound visualization a planned exit site from the hepatic vein and portal puncture site were placed into a single sonographic plane. Under direct ultrasound visualization, the ScorpionX needle was advanced into the central left portal vein. There is moderate redundancy in looping of the wire in the left portal vein prior to entering the main portal vein which required several manipulation techniques to reduce including balloon dilation and insertion of a stiff buddy wire. A 5 French marking pigtail catheter was then advanced over the wire into the superior mesenteric vein and wire removed. Portal venogram was performed which demonstrated occlusive thrombus throughout the central superior mesenteric vein and main portal vein. The tract was then dilated to 8 mm with an 8 mm x 8 cm Athletis balloon. A 8-10 mm by 7 + 2 cm of Viatorr endograft was placed. This was dilated to 8 mm. The indwelling 10 French sheath was then exchanged for a 16 French, 33 cm dry seal sheath. The sheath was directed to the portal aspect of the  endograft. Aspiration thrombectomy was then performed in multiple passes over the wire through the main portal vein and into the central superior mesenteric vein. Moderate acute and chronic appearing thrombus were collected in the aspiration canister. A sample was sent for pathology. Repeat portal venogram demonstrated persistent occlusive appearing thrombus in the main portal vein and irregular luminal filling defects in the central superior mesenteric vein. Therefore, balloon angioplasty was performed with a 6 mm x 8 cm Athletis balloon. Repeat portal venogram demonstrated no evidence of extravasation in mildly restored luminal gain in the central superior mesenteric vein. This region was then treated with a 6 mm x 8 cm In.Pact drug coated balloon prolonged inflation for total of 3 minutes. After angioplasty, the central superior mesenteric vein appear patent with inline flow into the indwelling tips endograft. Additional balloon angioplasty through the tips endograft within 8 mm balloon was performed. Repeat portal venogram demonstrated minimal improved patency. Additional aspiration thrombectomy was performed through the tips endograft which yielded moderate acute appearing thrombus. Repeat portal venogram demonstrated persistent near occlusive thrombus throughout the indwelling tips endograft in main portal vein. Therefore, mechanical thrombectomy was performed through the tips endograft and main portal vein into the central superior mesenteric vein with a 6 [place] cleaner device undergoing multiple passes. Repeat portal venogram demonstrated improved patency and inline flow via the indwelling tips, however with multiple irregular luminal filling defects and sluggish flow. Retrograde flow was noted into the left gastric vein which filled multiple small gastroesophageal varices. The indwelling tips endograft was then dilated with a 10 mm x 80 mm Athletis balloon. Repeat portal venogram demonstrated improved  patency and antegrade flow through the indwelling endograft. There is persistent retrograde filling of the left gastric vein. Therefore, a 5 French C2 catheter was used to select the left gastric vein. Left gastric venogram demonstrated retrograde flow with opacification and irregular vascularity about multifocal small gastroesophageal varices. Therefore, coil embolization was performed about the central left gastric vein with 2, 6 mm 0.035 "Azur detachable coils. Completion portal venogram demonstrated successfully embolized left gastric vein with antegrade flow via patent main portal vein in newly placed TIPS endograft, however several scattered filling defects remain in the main portal vein and in the endograft. The catheters and sheath were removed and manual  compression was applied to the right internal jugular and right greater saphenous venous access sites until hemostasis was achieved. The patient was administered 60 mg of subcutaneous Lovenox. The patient was transferred to the PACU in stable condition. IMPRESSION: 1. 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 2. 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 3. Technically successful aspiration and mechanical thrombectomy of the superior mesenteric and main portal veins. 4. Technically successful plain and drug coated balloon angioplasty of the superior mesenteric and main portal veins. 5. Technically successful coil embolization of the left gastric vein. PLAN: Begin therapeutic Lovenox for 1 month prior to transitioning back to oral anticoagulation. IR will follow while inpatient and arrange outpatient followup within 1 month after discharge. Marliss Coots, MD [facility] Radiology Electronically Signed   By: Marliss Coots M.D.   On: 02/09/2023 22:10   IR US Guide Vasc Access  Right Result Date: 02/09/2023 CLINICAL DATA:  78 year old female with history of pancreatic cancer status post radiation with development of progressive acute portal vein thrombus and recurrent gastric hemorrhage. EXAM: 1. Ultrasound-guided access of the right internal jugular vein 2. Ultrasound-guided access of the right greater saphenous vein 3. Hepatic venogram 4. Intravascular ultrasound 5. Catheterization of the portal vein 6. Portal venogram 7. Creation of a transhepatic portal vein to hepatic vein shunt 8. Aspiration thrombectomy of the portal vein and superior mesenteric vein 9. Mechanical thrombectomy of the portal vein and superior mesenteric vein 10. Drug coated balloon angioplasty of the main portal vein and superior mesenteric vein 11. Embolization of left gastric vein MEDICATIONS: As antibiotic prophylaxis, Rocephin 1 gm IV was ordered pre-procedure and administered intravenously within one hour of incision. ANESTHESIA/SEDATION: General - as administered by the Anesthesia department CONTRAST:  One hundred ML Omnipaque 300, intravenous FLUOROSCOPY TIME:  2,580 mGy COMPLICATIONS: None immediate. PROCEDURE: The procedure was performed in concert with my partner Dr. Katherina Right. Informed written consent was obtained from the patient after a thorough discussion of the procedural risks, benefits and alternatives. All questions were addressed. Maximal Sterile Barrier Technique was utilized including caps, mask, sterile gowns, sterile gloves, sterile drape, hand hygiene and skin antiseptic. A timeout was performed prior to the initiation of the procedure. A preliminary ultrasound of the right groin was performed and demonstrates a patent right common femoral vein and central greater saphenous vein. A permanent ultrasound image was recorded. Using a combination of fluoroscopy and ultrasound, an access site was determined. A small dermatotomy was made at the planned puncture site. Using ultrasound guidance,  access into the right greater saphenous vein was obtained with visualization of needle entry into the vessel using a standard micropuncture technique. A wire was advanced into the IVC insert all fascial dilation performed. An 8 [place], 11 cm vascular sheath was placed into the external iliac vein. Through this access site, an 61 [place] ICE catheter was advanced with ease under fluoroscopic guidance to the level of the intrahepatic inferior vena cava. A preliminary ultrasound of the right neck was performed and demonstrates a patent internal jugular vein. A permanent ultrasound image was recorded. Using a combination of fluoroscopy and ultrasound, an access site was determined. A small dermatotomy was made at the planned puncture site. Using ultrasound guidance, access into the right internal jugular vein was obtained with visualization of needle entry into the vessel using a standard micropuncture technique. A wire was advanced into the IVC and  serial fascial dilation performed. A 10 French tips sheath was placed into the internal jugular vein and advanced to the IVC. The jugular sheath was retracted into the right atrium and manometry was performed measuring a mean pressure of 11 mmHg. A 5 French angled tip catheter was then directed into the right hepatic vein. Hepatic venogram was performed. These images demonstrated patent hepatic vein with no stenosis. The catheter was advanced to a wedge portion of the a patent vein over which the 10 French sheath was advanced into the right hepatic vein. Using ICE ultrasound visualization the catheter as right hepatic vein as well as the portal anatomy was defined. There was acute appearing, mildly expansile heterogeneously hypoechoic thrombus throughout the portal veins. A planned exit site from the hepatic vein and puncture site from the portal vein was placed into a single sonographic plane. Under direct ultrasound visualization, the ScorpionX needle was advanced  into the central right portal vein. A Glidewire Advantage was then advanced however coursed laterally in appeared to follow and extrahepatic location. Upon manipulating to preserve portal venous access, hepatic vein access was lost with the base sheath. Therefore, the jugular sheath was then retracted into the right atrium and a 5 French angled tip catheter was directed into the middle hepatic vein. Hepatic venogram was performed. These images demonstrated patent hepatic vein with no stenosis. The catheter was advanced to a wedged portion of the patent vein over which the 10 French sheath was advanced into the middle hepatic vein. Using ICE ultrasound visualization a planned exit site from the hepatic vein and portal puncture site were placed into a single sonographic plane. Under direct ultrasound visualization, the ScorpionX needle was advanced into the central left portal vein. There is moderate redundancy in looping of the wire in the left portal vein prior to entering the main portal vein which required several manipulation techniques to reduce including balloon dilation and insertion of a stiff buddy wire. A 5 French marking pigtail catheter was then advanced over the wire into the superior mesenteric vein and wire removed. Portal venogram was performed which demonstrated occlusive thrombus throughout the central superior mesenteric vein and main portal vein. The tract was then dilated to 8 mm with an 8 mm x 8 cm Athletis balloon. A 8-10 mm by 7 + 2 cm of Viatorr endograft was placed. This was dilated to 8 mm. The indwelling 10 French sheath was then exchanged for a 16 French, 33 cm dry seal sheath. The sheath was directed to the portal aspect of the endograft. Aspiration thrombectomy was then performed in multiple passes over the wire through the main portal vein and into the central superior mesenteric vein. Moderate acute and chronic appearing thrombus were collected in the aspiration canister. A sample was  sent for pathology. Repeat portal venogram demonstrated persistent occlusive appearing thrombus in the main portal vein and irregular luminal filling defects in the central superior mesenteric vein. Therefore, balloon angioplasty was performed with a 6 mm x 8 cm Athletis balloon. Repeat portal venogram demonstrated no evidence of extravasation in mildly restored luminal gain in the central superior mesenteric vein. This region was then treated with a 6 mm x 8 cm In.Pact drug coated balloon prolonged inflation for total of 3 minutes. After angioplasty, the central superior mesenteric vein appear patent with inline flow into the indwelling tips endograft. Additional balloon angioplasty through the tips endograft within 8 mm balloon was performed. Repeat portal venogram demonstrated minimal improved patency. Additional aspiration thrombectomy was performed  through the tips endograft which yielded moderate acute appearing thrombus. Repeat portal venogram demonstrated persistent near occlusive thrombus throughout the indwelling tips endograft in main portal vein. Therefore, mechanical thrombectomy was performed through the tips endograft and main portal vein into the central superior mesenteric vein with a 6 [place] cleaner device undergoing multiple passes. Repeat portal venogram demonstrated improved patency and inline flow via the indwelling tips, however with multiple irregular luminal filling defects and sluggish flow. Retrograde flow was noted into the left gastric vein which filled multiple small gastroesophageal varices. The indwelling tips endograft was then dilated with a 10 mm x 80 mm Athletis balloon. Repeat portal venogram demonstrated improved patency and antegrade flow through the indwelling endograft. There is persistent retrograde filling of the left gastric vein. Therefore, a 5 French C2 catheter was used to select the left gastric vein. Left gastric venogram demonstrated retrograde flow with  opacification and irregular vascularity about multifocal small gastroesophageal varices. Therefore, coil embolization was performed about the central left gastric vein with 2, 6 mm 0.035 "Azur detachable coils. Completion portal venogram demonstrated successfully embolized left gastric vein with antegrade flow via patent main portal vein in newly placed TIPS endograft, however several scattered filling defects remain in the main portal vein and in the endograft. The catheters and sheath were removed and manual compression was applied to the right internal jugular and right greater saphenous venous access sites until hemostasis was achieved. The patient was administered 60 mg of subcutaneous Lovenox. The patient was transferred to the PACU in stable condition. IMPRESSION: 1. 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 2. 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 3. Technically successful aspiration and mechanical thrombectomy of the superior mesenteric and main portal veins. 4. Technically successful plain and drug coated balloon angioplasty of the superior mesenteric and main portal veins. 5. Technically successful coil embolization of the left gastric vein. PLAN: Begin therapeutic Lovenox for 1 month prior to transitioning back to oral anticoagulation. IR will follow while inpatient and arrange outpatient followup within 1 month after discharge. Marliss Coots, MD [facility] Radiology Electronically Signed   By: Marliss Coots M.D.   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 EXAM: 1. Ultrasound-guided access of the right  internal jugular vein 2. Ultrasound-guided access of the right greater saphenous vein 3. Hepatic venogram 4. Intravascular ultrasound 5. Catheterization of the portal vein 6. Portal venogram 7. Creation of a transhepatic portal vein to hepatic vein shunt 8. Aspiration thrombectomy of the portal vein and superior mesenteric vein 9. Mechanical thrombectomy of the portal vein and superior mesenteric vein 10. Drug coated balloon angioplasty of the main portal vein and superior mesenteric vein 11. Embolization of left gastric vein MEDICATIONS: As antibiotic prophylaxis, Rocephin 1 gm IV was ordered pre-procedure and administered intravenously within one hour of incision. ANESTHESIA/SEDATION: General - as administered by the Anesthesia department CONTRAST:  One hundred ML Omnipaque 300, intravenous FLUOROSCOPY TIME:  2,580 mGy COMPLICATIONS: None immediate. PROCEDURE: The procedure was performed in concert with my partner Dr. Katherina Right. Informed written consent was obtained from the patient after a thorough discussion of the procedural risks, benefits and alternatives. All questions were addressed. Maximal Sterile Barrier Technique was utilized including caps, mask,  sterile gowns, sterile gloves, sterile drape, hand hygiene and skin antiseptic. A timeout was performed prior to the initiation of the procedure. A preliminary ultrasound of the right groin was performed and demonstrates a patent right common femoral vein and central greater saphenous vein. A permanent ultrasound image was recorded. Using a combination of fluoroscopy and ultrasound, an access site was determined. A small dermatotomy was made at the planned puncture site. Using ultrasound guidance, access into the right greater saphenous vein was obtained with visualization of needle entry into the vessel using a standard micropuncture technique. A wire was advanced into the IVC insert all fascial dilation performed. An 8 [place], 11 cm vascular sheath was  placed into the external iliac vein. Through this access site, an 45 [place] ICE catheter was advanced with ease under fluoroscopic guidance to the level of the intrahepatic inferior vena cava. A preliminary ultrasound of the right neck was performed and demonstrates a patent internal jugular vein. A permanent ultrasound image was recorded. Using a combination of fluoroscopy and ultrasound, an access site was determined. A small dermatotomy was made at the planned puncture site. Using ultrasound guidance, access into the right internal jugular vein was obtained with visualization of needle entry into the vessel using a standard micropuncture technique. A wire was advanced into the IVC and serial fascial dilation performed. A 10 French tips sheath was placed into the internal jugular vein and advanced to the IVC. The jugular sheath was retracted into the right atrium and manometry was performed measuring a mean pressure of 11 mmHg. A 5 French angled tip catheter was then directed into the right hepatic vein. Hepatic venogram was performed. These images demonstrated patent hepatic vein with no stenosis. The catheter was advanced to a wedge portion of the a patent vein over which the 10 French sheath was advanced into the right hepatic vein. Using ICE ultrasound visualization the catheter as right hepatic vein as well as the portal anatomy was defined. There was acute appearing, mildly expansile heterogeneously hypoechoic thrombus throughout the portal veins. A planned exit site from the hepatic vein and puncture site from the portal vein was placed into a single sonographic plane. Under direct ultrasound visualization, the ScorpionX needle was advanced into the central right portal vein. A Glidewire Advantage was then advanced however coursed laterally in appeared to follow and extrahepatic location. Upon manipulating to preserve portal venous access, hepatic vein access was lost with the base sheath. Therefore,  the jugular sheath was then retracted into the right atrium and a 5 French angled tip catheter was directed into the middle hepatic vein. Hepatic venogram was performed. These images demonstrated patent hepatic vein with no stenosis. The catheter was advanced to a wedged portion of the patent vein over which the 10 French sheath was advanced into the middle hepatic vein. Using ICE ultrasound visualization a planned exit site from the hepatic vein and portal puncture site were placed into a single sonographic plane. Under direct ultrasound visualization, the ScorpionX needle was advanced into the central left portal vein. There is moderate redundancy in looping of the wire in the left portal vein prior to entering the main portal vein which required several manipulation techniques to reduce including balloon dilation and insertion of a stiff buddy wire. A 5 French marking pigtail catheter was then advanced over the wire into the superior mesenteric vein and wire removed. Portal venogram was performed which demonstrated occlusive thrombus throughout the central superior mesenteric vein and main portal vein. The  tract was then dilated to 8 mm with an 8 mm x 8 cm Athletis balloon. A 8-10 mm by 7 + 2 cm of Viatorr endograft was placed. This was dilated to 8 mm. The indwelling 10 French sheath was then exchanged for a 16 French, 33 cm dry seal sheath. The sheath was directed to the portal aspect of the endograft. Aspiration thrombectomy was then performed in multiple passes over the wire through the main portal vein and into the central superior mesenteric vein. Moderate acute and chronic appearing thrombus were collected in the aspiration canister. A sample was sent for pathology. Repeat portal venogram demonstrated persistent occlusive appearing thrombus in the main portal vein and irregular luminal filling defects in the central superior mesenteric vein. Therefore, balloon angioplasty was performed with a 6 mm x 8 cm  Athletis balloon. Repeat portal venogram demonstrated no evidence of extravasation in mildly restored luminal gain in the central superior mesenteric vein. This region was then treated with a 6 mm x 8 cm In.Pact drug coated balloon prolonged inflation for total of 3 minutes. After angioplasty, the central superior mesenteric vein appear patent with inline flow into the indwelling tips endograft. Additional balloon angioplasty through the tips endograft within 8 mm balloon was performed. Repeat portal venogram demonstrated minimal improved patency. Additional aspiration thrombectomy was performed through the tips endograft which yielded moderate acute appearing thrombus. Repeat portal venogram demonstrated persistent near occlusive thrombus throughout the indwelling tips endograft in main portal vein. Therefore, mechanical thrombectomy was performed through the tips endograft and main portal vein into the central superior mesenteric vein with a 6 [place] cleaner device undergoing multiple passes. Repeat portal venogram demonstrated improved patency and inline flow via the indwelling tips, however with multiple irregular luminal filling defects and sluggish flow. Retrograde flow was noted into the left gastric vein which filled multiple small gastroesophageal varices. The indwelling tips endograft was then dilated with a 10 mm x 80 mm Athletis balloon. Repeat portal venogram demonstrated improved patency and antegrade flow through the indwelling endograft. There is persistent retrograde filling of the left gastric vein. Therefore, a 5 French C2 catheter was used to select the left gastric vein. Left gastric venogram demonstrated retrograde flow with opacification and irregular vascularity about multifocal small gastroesophageal varices. Therefore, coil embolization was performed about the central left gastric vein with 2, 6 mm 0.035 "Azur detachable coils. Completion portal venogram demonstrated successfully embolized  left gastric vein with antegrade flow via patent main portal vein in newly placed TIPS endograft, however several scattered filling defects remain in the main portal vein and in the endograft. The catheters and sheath were removed and manual compression was applied to the right internal jugular and right greater saphenous venous access sites until hemostasis was achieved. The patient was administered 60 mg of subcutaneous Lovenox. The patient was transferred to the PACU in stable condition. IMPRESSION: 1. 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 2. 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 3. Technically successful aspiration and mechanical thrombectomy of the superior mesenteric and main portal veins. 4. Technically successful plain and drug coated balloon angioplasty of the superior mesenteric and main portal veins. 5. Technically successful coil embolization of the left gastric vein. PLAN: Begin therapeutic Lovenox for 1 month prior to transitioning back to oral anticoagulation. IR will follow while inpatient and arrange outpatient followup within 1 month after discharge. Marliss Coots, MD [facility] Radiology  Electronically Signed   By: Marliss Coots M.D.   On: 02/09/2023 22:10   IR EMBO VENOUS NOT HEMORR [facility]EMANG  INC GUIDE ROADMAPPING Result Date: 02/09/2023 CLINICAL DATA:  78 year old female with history of pancreatic cancer status post radiation with development of progressive acute portal vein thrombus and recurrent gastric hemorrhage. EXAM: 1. Ultrasound-guided access of the right internal jugular vein 2. Ultrasound-guided access of the right greater saphenous vein 3. Hepatic venogram 4. Intravascular ultrasound 5. Catheterization of the portal vein 6. Portal venogram 7. Creation of a transhepatic portal vein to hepatic vein shunt  8. Aspiration thrombectomy of the portal vein and superior mesenteric vein 9. Mechanical thrombectomy of the portal vein and superior mesenteric vein 10. Drug coated balloon angioplasty of the main portal vein and superior mesenteric vein 11. Embolization of left gastric vein MEDICATIONS: As antibiotic prophylaxis, Rocephin 1 gm IV was ordered pre-procedure and administered intravenously within one hour of incision. ANESTHESIA/SEDATION: General - as administered by the Anesthesia department CONTRAST:  One hundred ML Omnipaque 300, intravenous FLUOROSCOPY TIME:  2,580 mGy COMPLICATIONS: None immediate. PROCEDURE: The procedure was performed in concert with my partner Dr. Katherina Right. Informed written consent was obtained from the patient after a thorough discussion of the procedural risks, benefits and alternatives. All questions were addressed. Maximal Sterile Barrier Technique was utilized including caps, mask, sterile gowns, sterile gloves, sterile drape, hand hygiene and skin antiseptic. A timeout was performed prior to the initiation of the procedure. A preliminary ultrasound of the right groin was performed and demonstrates a patent right common femoral vein and central greater saphenous vein. A permanent ultrasound image was recorded. Using a combination of fluoroscopy and ultrasound, an access site was determined. A small dermatotomy was made at the planned puncture site. Using ultrasound guidance, access into the right greater saphenous vein was obtained with visualization of needle entry into the vessel using a standard micropuncture technique. A wire was advanced into the IVC insert all fascial dilation performed. An 8 [place], 11 cm vascular sheath was placed into the external iliac vein. Through this access site, an 54 [place] ICE catheter was advanced with ease under fluoroscopic guidance to the level of the intrahepatic inferior vena cava. A preliminary ultrasound of the right neck was performed and  demonstrates a patent internal jugular vein. A permanent ultrasound image was recorded. Using a combination of fluoroscopy and ultrasound, an access site was determined. A small dermatotomy was made at the planned puncture site. Using ultrasound guidance, access into the right internal jugular vein was obtained with visualization of needle entry into the vessel using a standard micropuncture technique. A wire was advanced into the IVC and serial fascial dilation performed. A 10 French tips sheath was placed into the internal jugular vein and advanced to the IVC. The jugular sheath was retracted into the right atrium and manometry was performed measuring a mean pressure of 11 mmHg. A 5 French angled tip catheter was then directed into the right hepatic vein. Hepatic venogram was performed. These images demonstrated patent hepatic vein with no stenosis. The catheter was advanced to a wedge portion of the a patent vein over which the 10 French sheath was advanced into the right hepatic vein. Using ICE ultrasound visualization the catheter as right hepatic vein as well as the portal anatomy was defined. There was acute appearing, mildly expansile heterogeneously hypoechoic thrombus throughout the portal veins. A planned exit site from the hepatic vein and puncture site from the portal vein was placed  into a single sonographic plane. Under direct ultrasound visualization, the ScorpionX needle was advanced into the central right portal vein. A Glidewire Advantage was then advanced however coursed laterally in appeared to follow and extrahepatic location. Upon manipulating to preserve portal venous access, hepatic vein access was lost with the base sheath. Therefore, the jugular sheath was then retracted into the right atrium and a 5 French angled tip catheter was directed into the middle hepatic vein. Hepatic venogram was performed. These images demonstrated patent hepatic vein with no stenosis. The catheter was advanced  to a wedged portion of the patent vein over which the 10 French sheath was advanced into the middle hepatic vein. Using ICE ultrasound visualization a planned exit site from the hepatic vein and portal puncture site were placed into a single sonographic plane. Under direct ultrasound visualization, the ScorpionX needle was advanced into the central left portal vein. There is moderate redundancy in looping of the wire in the left portal vein prior to entering the main portal vein which required several manipulation techniques to reduce including balloon dilation and insertion of a stiff buddy wire. A 5 French marking pigtail catheter was then advanced over the wire into the superior mesenteric vein and wire removed. Portal venogram was performed which demonstrated occlusive thrombus throughout the central superior mesenteric vein and main portal vein. The tract was then dilated to 8 mm with an 8 mm x 8 cm Athletis balloon. A 8-10 mm by 7 + 2 cm of Viatorr endograft was placed. This was dilated to 8 mm. The indwelling 10 French sheath was then exchanged for a 16 French, 33 cm dry seal sheath. The sheath was directed to the portal aspect of the endograft. Aspiration thrombectomy was then performed in multiple passes over the wire through the main portal vein and into the central superior mesenteric vein. Moderate acute and chronic appearing thrombus were collected in the aspiration canister. A sample was sent for pathology. Repeat portal venogram demonstrated persistent occlusive appearing thrombus in the main portal vein and irregular luminal filling defects in the central superior mesenteric vein. Therefore, balloon angioplasty was performed with a 6 mm x 8 cm Athletis balloon. Repeat portal venogram demonstrated no evidence of extravasation in mildly restored luminal gain in the central superior mesenteric vein. This region was then treated with a 6 mm x 8 cm In.Pact drug coated balloon prolonged inflation for total  of 3 minutes. After angioplasty, the central superior mesenteric vein appear patent with inline flow into the indwelling tips endograft. Additional balloon angioplasty through the tips endograft within 8 mm balloon was performed. Repeat portal venogram demonstrated minimal improved patency. Additional aspiration thrombectomy was performed through the tips endograft which yielded moderate acute appearing thrombus. Repeat portal venogram demonstrated persistent near occlusive thrombus throughout the indwelling tips endograft in main portal vein. Therefore, mechanical thrombectomy was performed through the tips endograft and main portal vein into the central superior mesenteric vein with a 6 [place] cleaner device undergoing multiple passes. Repeat portal venogram demonstrated improved patency and inline flow via the indwelling tips, however with multiple irregular luminal filling defects and sluggish flow. Retrograde flow was noted into the left gastric vein which filled multiple small gastroesophageal varices. The indwelling tips endograft was then dilated with a 10 mm x 80 mm Athletis balloon. Repeat portal venogram demonstrated improved patency and antegrade flow through the indwelling endograft. There is persistent retrograde filling of the left gastric vein. Therefore, a 5 French C2 catheter was used to select  the left gastric vein. Left gastric venogram demonstrated retrograde flow with opacification and irregular vascularity about multifocal small gastroesophageal varices. Therefore, coil embolization was performed about the central left gastric vein with 2, 6 mm 0.035 "Azur detachable coils. Completion portal venogram demonstrated successfully embolized left gastric vein with antegrade flow via patent main portal vein in newly placed TIPS endograft, however several scattered filling defects remain in the main portal vein and in the endograft. The catheters and sheath were removed and manual compression was  applied to the right internal jugular and right greater saphenous venous access sites until hemostasis was achieved. The patient was administered 60 mg of subcutaneous Lovenox. The patient was transferred to the PACU in stable condition. IMPRESSION: 1. 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 2. 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 3. Technically successful aspiration and mechanical thrombectomy of the superior mesenteric and main portal veins. 4. Technically successful plain and drug coated balloon angioplasty of the superior mesenteric and main portal veins. 5. Technically successful coil embolization of the left gastric vein. PLAN: Begin therapeutic Lovenox for 1 month prior to transitioning back to oral anticoagulation. IR will follow while inpatient and arrange outpatient followup within 1 month after discharge. Marliss Coots, MD [facility] Electronically Signed   By: Marliss Coots M.D.   On: 02/09/2023 22:10      Signature  -   Susa Raring M.D on 02/11/2023 at 9:14 AM   -  To page go to [email or web address]

## 2023-02-11 NOTE — Progress Notes (Incomplete)
Referring Physician(s): Dr. 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 S/p TIPS creation, aspiration and mechanical portal thrombectomy, main portal vein and SMV drug coated balloon angioplasty and embolization of left gastric vein.   Subjective:  ***   Allergies: Other, Cherry, and Wound dressing adhesive  Medications: Prior to Admission medications   Medication Sig Start Date End Date Taking? Authorizing Provider  acetaminophen (TYLENOL) 500 MG tablet Take 2 tablets (1,000 mg total) by mouth every 8 (eight) hours as needed for moderate pain, headache or fever. Patient taking differently: Take 750-1,000 mg by mouth every 8 (eight) hours as needed for moderate pain (pain score 4-6), headache or fever. 07/21/21  Yes Vassie Loll, MD  buPROPion (WELLBUTRIN XL) 300 MG 24 hr tablet Take 300 mg by mouth daily after breakfast. Takes differently from prescribed: 20mg  BID 06/15/21  Yes [provider]  busPIRone (BUSPAR) 10 MG tablet Take 20 mg by mouth 2 (two) times daily. 01/27/21  Yes [provider]  Cholecalciferol (VITAMIN D3) 125 MCG (5000 UT) CAPS Take 5,000 Units by mouth every morning.   Yes [provider]  escitalopram (LEXAPRO) 20 MG tablet Take 20 mg by mouth at bedtime.   Yes [provider]  lactose free nutrition (BOOST) LIQD Take 237 mLs by mouth 2 (two) times daily between meals.   Yes [provider]  LINZESS 72 MCG capsule TAKE 1 CAPSULE BY MOUTH DAILY BEFORE BREAKFAST Patient taking differently: Take 72 mcg by mouth daily before breakfast. 01/20/23  Yes Mansouraty, Netty Starring., MD  mirtazapine (REMERON) 30 MG tablet Take 15 mg by mouth at bedtime.   Yes [provider]  pantoprazole (PROTONIX) 40 MG tablet Take 1 tablet (40 mg total) by mouth 2 (two) times daily. 02/02/23 05/03/23  Yes Carollee Herter, DO  QUEtiapine (SEROQUEL) 25 MG tablet Take 25 mg by mouth 2 (two) times daily. 12/14/22  Yes [provider]  lidocaine-prilocaine (EMLA) cream Apply a small amount to port a cath site (do not rub in) and cover with plastic wrap 1 hour prior to infusion appointments 09/15/21   Doreatha Massed, MD  ondansetron (ZOFRAN) 4 MG tablet Take 1 tablet (4 mg total) by mouth every 6 (six) hours as needed for nausea or vomiting. 02/02/23 03/04/23  Carollee Herter, DO     Vital Signs: BP 118/62 (BP Location: Right Arm)   Pulse (!) 113   Temp 98.7 F (37.1 C) (Axillary)   Resp 19   Ht 5' 2.5" (1.588 m)   Wt 130 lb (59 kg)   SpO2 97%   BMI 23.40 kg/m   Physical Exam Constitutional:      General: She is not in acute distress. Cardiovascular:     Pulses: Normal pulses.     Comments: RIJ and right femoral vascular access sites are clean, soft, dry and non-tender.  Pulmonary:     Effort: Pulmonary effort is normal.  Abdominal:     Tenderness: There is abdominal tenderness.     Comments: Generalized abdominal tenderness.   Neurological:     Mental Status: She is oriented to person, place, and time.     Imaging: IR Tips Result Date: 02/09/2023 CLINICAL DATA:  78 year old female with history of pancreatic cancer status post radiation with development of progressive acute portal vein thrombus and recurrent gastric hemorrhage. EXAM: 1. Ultrasound-guided access of the right internal  jugular vein 2. Ultrasound-guided access of the right greater saphenous vein 3. Hepatic venogram 4. Intravascular ultrasound 5. Catheterization of the portal vein 6. Portal venogram 7. Creation of a transhepatic portal vein to hepatic vein shunt 8. Aspiration thrombectomy of the portal vein and superior mesenteric vein 9. Mechanical thrombectomy of the portal vein and superior mesenteric vein 10. Drug coated balloon angioplasty of the main portal vein and superior mesenteric vein 11. Embolization of left  gastric vein MEDICATIONS: As antibiotic prophylaxis, Rocephin 1 gm IV was ordered pre-procedure and administered intravenously within one hour of incision. ANESTHESIA/SEDATION: General - as administered by the Anesthesia department CONTRAST:  One hundred ML Omnipaque 300, intravenous FLUOROSCOPY TIME:  2,580 mGy COMPLICATIONS: None immediate. PROCEDURE: The procedure was performed in concert with my partner Dr. Katherina Right. Informed written consent was obtained from the patient after a thorough discussion of the procedural risks, benefits and alternatives. All questions were addressed. Maximal Sterile Barrier Technique was utilized including caps, mask, sterile gowns, sterile gloves, sterile drape, hand hygiene and skin antiseptic. A timeout was performed prior to the initiation of the procedure. A preliminary ultrasound of the right groin was performed and demonstrates a patent right common femoral vein and central greater saphenous vein. A permanent ultrasound image was recorded. Using a combination of fluoroscopy and ultrasound, an access site was determined. A small dermatotomy was made at the planned puncture site. Using ultrasound guidance, access into the right greater saphenous vein was obtained with visualization of needle entry into the vessel using a standard micropuncture technique. A wire was advanced into the IVC insert all fascial dilation performed. An 8 [place], 11 cm vascular sheath was placed into the external iliac vein. Through this access site, an 73 [place] ICE catheter was advanced with ease under fluoroscopic guidance to the level of the intrahepatic inferior vena cava. A preliminary ultrasound of the right neck was performed and demonstrates a patent internal jugular vein. A permanent ultrasound image was recorded. Using a combination of fluoroscopy and ultrasound, an access site was determined. A small dermatotomy was made at the planned puncture site. Using ultrasound guidance, access  into the right internal jugular vein was obtained with visualization of needle entry into the vessel using a standard micropuncture technique. A wire was advanced into the IVC and serial fascial dilation performed. A 10 French tips sheath was placed into the internal jugular vein and advanced to the IVC. The jugular sheath was retracted into the right atrium and manometry was performed measuring a mean pressure of 11 mmHg. A 5 French angled tip catheter was then directed into the right hepatic vein. Hepatic venogram was performed. These images demonstrated patent hepatic vein with no stenosis. The catheter was advanced to a wedge portion of the a patent vein over which the 10 French sheath was advanced into the right hepatic vein. Using ICE ultrasound visualization the catheter as right hepatic vein as well as the portal anatomy was defined. There was acute appearing, mildly expansile heterogeneously hypoechoic thrombus throughout the portal veins. A planned exit site from the hepatic vein and puncture site from the portal vein was placed into a single sonographic plane. Under direct ultrasound visualization, the ScorpionX needle was advanced into the central right portal vein. A Glidewire Advantage was then advanced however coursed laterally in appeared to follow and extrahepatic location. Upon manipulating to preserve portal venous access, hepatic vein access was lost with the base sheath. Therefore, the jugular sheath was then retracted into the right  atrium and a 5 French angled tip catheter was directed into the middle hepatic vein. Hepatic venogram was performed. These images demonstrated patent hepatic vein with no stenosis. The catheter was advanced to a wedged portion of the patent vein over which the 10 French sheath was advanced into the middle hepatic vein. Using ICE ultrasound visualization a planned exit site from the hepatic vein and portal puncture site were placed into a single sonographic plane.  Under direct ultrasound visualization, the ScorpionX needle was advanced into the central left portal vein. There is moderate redundancy in looping of the wire in the left portal vein prior to entering the main portal vein which required several manipulation techniques to reduce including balloon dilation and insertion of a stiff buddy wire. A 5 French marking pigtail catheter was then advanced over the wire into the superior mesenteric vein and wire removed. Portal venogram was performed which demonstrated occlusive thrombus throughout the central superior mesenteric vein and main portal vein. The tract was then dilated to 8 mm with an 8 mm x 8 cm Athletis balloon. A 8-10 mm by 7 + 2 cm of Viatorr endograft was placed. This was dilated to 8 mm. The indwelling 10 French sheath was then exchanged for a 16 French, 33 cm dry seal sheath. The sheath was directed to the portal aspect of the endograft. Aspiration thrombectomy was then performed in multiple passes over the wire through the main portal vein and into the central superior mesenteric vein. Moderate acute and chronic appearing thrombus were collected in the aspiration canister. A sample was sent for pathology. Repeat portal venogram demonstrated persistent occlusive appearing thrombus in the main portal vein and irregular luminal filling defects in the central superior mesenteric vein. Therefore, balloon angioplasty was performed with a 6 mm x 8 cm Athletis balloon. Repeat portal venogram demonstrated no evidence of extravasation in mildly restored luminal gain in the central superior mesenteric vein. This region was then treated with a 6 mm x 8 cm In.Pact drug coated balloon prolonged inflation for total of 3 minutes. After angioplasty, the central superior mesenteric vein appear patent with inline flow into the indwelling tips endograft. Additional balloon angioplasty through the tips endograft within 8 mm balloon was performed. Repeat portal venogram  demonstrated minimal improved patency. Additional aspiration thrombectomy was performed through the tips endograft which yielded moderate acute appearing thrombus. Repeat portal venogram demonstrated persistent near occlusive thrombus throughout the indwelling tips endograft in main portal vein. Therefore, mechanical thrombectomy was performed through the tips endograft and main portal vein into the central superior mesenteric vein with a 6 [place] cleaner device undergoing multiple passes. Repeat portal venogram demonstrated improved patency and inline flow via the indwelling tips, however with multiple irregular luminal filling defects and sluggish flow. Retrograde flow was noted into the left gastric vein which filled multiple small gastroesophageal varices. The indwelling tips endograft was then dilated with a 10 mm x 80 mm Athletis balloon. Repeat portal venogram demonstrated improved patency and antegrade flow through the indwelling endograft. There is persistent retrograde filling of the left gastric vein. Therefore, a 5 French C2 catheter was used to select the left gastric vein. Left gastric venogram demonstrated retrograde flow with opacification and irregular vascularity about multifocal small gastroesophageal varices. Therefore, coil embolization was performed about the central left gastric vein with 2, 6 mm 0.035 "Azur detachable coils. Completion portal venogram demonstrated successfully embolized left gastric vein with antegrade flow via patent main portal vein in newly placed TIPS endograft, however  several scattered filling defects remain in the main portal vein and in the endograft. The catheters and sheath were removed and manual compression was applied to the right internal jugular and right greater saphenous venous access sites until hemostasis was achieved. The patient was administered 60 mg of subcutaneous Lovenox. The patient was transferred to the PACU in stable condition. IMPRESSION: 1.  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 2. 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 3. Technically successful aspiration and mechanical thrombectomy of the superior mesenteric and main portal veins. 4. Technically successful plain and drug coated balloon angioplasty of the superior mesenteric and main portal veins. 5. Technically successful coil embolization of the left gastric vein. PLAN: Begin therapeutic Lovenox for 1 month prior to transitioning back to oral anticoagulation. IR will follow while inpatient and arrange outpatient followup within 1 month after discharge. Marliss Coots, MD [facility] Radiology Electronically Signed   By: Marliss Coots M.D.   On: 02/09/2023 22:10   IR US Guide Vasc Access Right Result Date: 02/09/2023 CLINICAL DATA:  78 year old female with history of pancreatic cancer status post radiation with development of progressive acute portal vein thrombus and recurrent gastric hemorrhage. EXAM: 1. Ultrasound-guided access of the right internal jugular vein 2. Ultrasound-guided access of the right greater saphenous vein 3. Hepatic venogram 4. Intravascular ultrasound 5. Catheterization of the portal vein 6. Portal venogram 7. Creation of a transhepatic portal vein to hepatic vein shunt 8. Aspiration thrombectomy of the portal vein and superior mesenteric vein 9. Mechanical thrombectomy of the portal vein and superior mesenteric vein 10. Drug coated balloon angioplasty of the main portal vein and superior mesenteric vein 11. Embolization of left gastric vein MEDICATIONS: As antibiotic prophylaxis, Rocephin 1 gm IV was ordered pre-procedure and administered intravenously within one hour of incision. ANESTHESIA/SEDATION: General - as administered by the Anesthesia department CONTRAST:  One hundred ML Omnipaque 300,  intravenous FLUOROSCOPY TIME:  2,580 mGy COMPLICATIONS: None immediate. PROCEDURE: The procedure was performed in concert with my partner Dr. Katherina Right. Informed written consent was obtained from the patient after a thorough discussion of the procedural risks, benefits and alternatives. All questions were addressed. Maximal Sterile Barrier Technique was utilized including caps, mask, sterile gowns, sterile gloves, sterile drape, hand hygiene and skin antiseptic. A timeout was performed prior to the initiation of the procedure. A preliminary ultrasound of the right groin was performed and demonstrates a patent right common femoral vein and central greater saphenous vein. A permanent ultrasound image was recorded. Using a combination of fluoroscopy and ultrasound, an access site was determined. A small dermatotomy was made at the planned puncture site. Using ultrasound guidance, access into the right greater saphenous vein was obtained with visualization of needle entry into the vessel using a standard micropuncture technique. A wire was advanced into the IVC insert all fascial dilation performed. An 8 [place], 11 cm vascular sheath was placed into the external iliac vein. Through this access site, an 69 [place] ICE catheter was advanced with ease under fluoroscopic guidance to the level of the intrahepatic inferior vena cava. A preliminary ultrasound of the right neck was performed and demonstrates a patent internal jugular vein. A permanent ultrasound image was recorded. Using a combination of fluoroscopy and ultrasound, an access site was determined. A small dermatotomy was made at the planned puncture site. Using ultrasound guidance, access into the right internal jugular vein was  obtained with visualization of needle entry into the vessel using a standard micropuncture technique. A wire was advanced into the IVC and serial fascial dilation performed. A 10 French tips sheath was placed into the internal  jugular vein and advanced to the IVC. The jugular sheath was retracted into the right atrium and manometry was performed measuring a mean pressure of 11 mmHg. A 5 French angled tip catheter was then directed into the right hepatic vein. Hepatic venogram was performed. These images demonstrated patent hepatic vein with no stenosis. The catheter was advanced to a wedge portion of the a patent vein over which the 10 French sheath was advanced into the right hepatic vein. Using ICE ultrasound visualization the catheter as right hepatic vein as well as the portal anatomy was defined. There was acute appearing, mildly expansile heterogeneously hypoechoic thrombus throughout the portal veins. A planned exit site from the hepatic vein and puncture site from the portal vein was placed into a single sonographic plane. Under direct ultrasound visualization, the ScorpionX needle was advanced into the central right portal vein. A Glidewire Advantage was then advanced however coursed laterally in appeared to follow and extrahepatic location. Upon manipulating to preserve portal venous access, hepatic vein access was lost with the base sheath. Therefore, the jugular sheath was then retracted into the right atrium and a 5 French angled tip catheter was directed into the middle hepatic vein. Hepatic venogram was performed. These images demonstrated patent hepatic vein with no stenosis. The catheter was advanced to a wedged portion of the patent vein over which the 10 French sheath was advanced into the middle hepatic vein. Using ICE ultrasound visualization a planned exit site from the hepatic vein and portal puncture site were placed into a single sonographic plane. Under direct ultrasound visualization, the ScorpionX needle was advanced into the central left portal vein. There is moderate redundancy in looping of the wire in the left portal vein prior to entering the main portal vein which required several manipulation techniques  to reduce including balloon dilation and insertion of a stiff buddy wire. A 5 French marking pigtail catheter was then advanced over the wire into the superior mesenteric vein and wire removed. Portal venogram was performed which demonstrated occlusive thrombus throughout the central superior mesenteric vein and main portal vein. The tract was then dilated to 8 mm with an 8 mm x 8 cm Athletis balloon. A 8-10 mm by 7 + 2 cm of Viatorr endograft was placed. This was dilated to 8 mm. The indwelling 10 French sheath was then exchanged for a 16 French, 33 cm dry seal sheath. The sheath was directed to the portal aspect of the endograft. Aspiration thrombectomy was then performed in multiple passes over the wire through the main portal vein and into the central superior mesenteric vein. Moderate acute and chronic appearing thrombus were collected in the aspiration canister. A sample was sent for pathology. Repeat portal venogram demonstrated persistent occlusive appearing thrombus in the main portal vein and irregular luminal filling defects in the central superior mesenteric vein. Therefore, balloon angioplasty was performed with a 6 mm x 8 cm Athletis balloon. Repeat portal venogram demonstrated no evidence of extravasation in mildly restored luminal gain in the central superior mesenteric vein. This region was then treated with a 6 mm x 8 cm In.Pact drug coated balloon prolonged inflation for total of 3 minutes. After angioplasty, the central superior mesenteric vein appear patent with inline flow into the indwelling tips endograft. Additional balloon angioplasty  through the tips endograft within 8 mm balloon was performed. Repeat portal venogram demonstrated minimal improved patency. Additional aspiration thrombectomy was performed through the tips endograft which yielded moderate acute appearing thrombus. Repeat portal venogram demonstrated persistent near occlusive thrombus throughout the indwelling tips endograft  in main portal vein. Therefore, mechanical thrombectomy was performed through the tips endograft and main portal vein into the central superior mesenteric vein with a 6 [place] cleaner device undergoing multiple passes. Repeat portal venogram demonstrated improved patency and inline flow via the indwelling tips, however with multiple irregular luminal filling defects and sluggish flow. Retrograde flow was noted into the left gastric vein which filled multiple small gastroesophageal varices. The indwelling tips endograft was then dilated with a 10 mm x 80 mm Athletis balloon. Repeat portal venogram demonstrated improved patency and antegrade flow through the indwelling endograft. There is persistent retrograde filling of the left gastric vein. Therefore, a 5 French C2 catheter was used to select the left gastric vein. Left gastric venogram demonstrated retrograde flow with opacification and irregular vascularity about multifocal small gastroesophageal varices. Therefore, coil embolization was performed about the central left gastric vein with 2, 6 mm 0.035 "Azur detachable coils. Completion portal venogram demonstrated successfully embolized left gastric vein with antegrade flow via patent main portal vein in newly placed TIPS endograft, however several scattered filling defects remain in the main portal vein and in the endograft. The catheters and sheath were removed and manual compression was applied to the right internal jugular and right greater saphenous venous access sites until hemostasis was achieved. The patient was administered 60 mg of subcutaneous Lovenox. The patient was transferred to the PACU in stable condition. IMPRESSION: 1. 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 2. 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 3. Technically successful aspiration and mechanical thrombectomy  of the superior mesenteric and main portal veins. 4. Technically successful plain and drug coated balloon angioplasty of the superior mesenteric and main portal veins. 5. Technically successful coil embolization of the left gastric vein. PLAN: Begin therapeutic Lovenox for 1 month prior to transitioning back to oral anticoagulation. IR will follow while inpatient and arrange outpatient followup within 1 month after discharge. Marliss Coots, MD [facility] Radiology Electronically Signed   By: Marliss Coots M.D.   On: 02/09/2023 22:10   IR US Guide Vasc Access Right Result Date: 02/09/2023 CLINICAL DATA:  78 year old female with history of pancreatic cancer status post radiation with development of progressive acute portal vein thrombus and recurrent gastric hemorrhage. EXAM: 1. Ultrasound-guided access of the right internal jugular vein 2. Ultrasound-guided access of the right greater saphenous vein 3. Hepatic venogram 4. Intravascular ultrasound 5. Catheterization of the portal vein 6. Portal venogram 7. Creation of a transhepatic portal vein to hepatic vein shunt 8. Aspiration thrombectomy of the portal vein and superior mesenteric vein 9. Mechanical thrombectomy of the portal vein and superior mesenteric vein 10. Drug coated balloon angioplasty of the main portal vein and superior mesenteric vein 11. Embolization of left gastric vein MEDICATIONS: As antibiotic prophylaxis, Rocephin 1 gm IV was ordered pre-procedure and administered intravenously within one hour of incision. ANESTHESIA/SEDATION: General - as administered by the Anesthesia department CONTRAST:  One hundred ML Omnipaque 300, intravenous FLUOROSCOPY TIME:  2,580 mGy COMPLICATIONS: None immediate. PROCEDURE: The procedure was performed in concert with my partner Dr. Katherina Right. Informed written consent was obtained from the patient after a  thorough discussion of the procedural risks, benefits and  alternatives. All questions were addressed. Maximal Sterile Barrier Technique was utilized including caps, mask, sterile gowns, sterile gloves, sterile drape, hand hygiene and skin antiseptic. A timeout was performed prior to the initiation of the procedure. A preliminary ultrasound of the right groin was performed and demonstrates a patent right common femoral vein and central greater saphenous vein. A permanent ultrasound image was recorded. Using a combination of fluoroscopy and ultrasound, an access site was determined. A small dermatotomy was made at the planned puncture site. Using ultrasound guidance, access into the right greater saphenous vein was obtained with visualization of needle entry into the vessel using a standard micropuncture technique. A wire was advanced into the IVC insert all fascial dilation performed. An 8 [place], 11 cm vascular sheath was placed into the external iliac vein. Through this access site, an 75 [place] ICE catheter was advanced with ease under fluoroscopic guidance to the level of the intrahepatic inferior vena cava. A preliminary ultrasound of the right neck was performed and demonstrates a patent internal jugular vein. A permanent ultrasound image was recorded. Using a combination of fluoroscopy and ultrasound, an access site was determined. A small dermatotomy was made at the planned puncture site. Using ultrasound guidance, access into the right internal jugular vein was obtained with visualization of needle entry into the vessel using a standard micropuncture technique. A wire was advanced into the IVC and serial fascial dilation performed. A 10 French tips sheath was placed into the internal jugular vein and advanced to the IVC. The jugular sheath was retracted into the right atrium and manometry was performed measuring a mean pressure of 11 mmHg. A 5 French angled tip catheter was then directed into the right hepatic vein. Hepatic venogram was performed. These  images demonstrated patent hepatic vein with no stenosis. The catheter was advanced to a wedge portion of the a patent vein over which the 10 French sheath was advanced into the right hepatic vein. Using ICE ultrasound visualization the catheter as right hepatic vein as well as the portal anatomy was defined. There was acute appearing, mildly expansile heterogeneously hypoechoic thrombus throughout the portal veins. A planned exit site from the hepatic vein and puncture site from the portal vein was placed into a single sonographic plane. Under direct ultrasound visualization, the ScorpionX needle was advanced into the central right portal vein. A Glidewire Advantage was then advanced however coursed laterally in appeared to follow and extrahepatic location. Upon manipulating to preserve portal venous access, hepatic vein access was lost with the base sheath. Therefore, the jugular sheath was then retracted into the right atrium and a 5 French angled tip catheter was directed into the middle hepatic vein. Hepatic venogram was performed. These images demonstrated patent hepatic vein with no stenosis. The catheter was advanced to a wedged portion of the patent vein over which the 10 French sheath was advanced into the middle hepatic vein. Using ICE ultrasound visualization a planned exit site from the hepatic vein and portal puncture site were placed into a single sonographic plane. Under direct ultrasound visualization, the ScorpionX needle was advanced into the central left portal vein. There is moderate redundancy in looping of the wire in the left portal vein prior to entering the main portal vein which required several manipulation techniques to reduce including balloon dilation and insertion of a stiff buddy wire. A 5 French marking pigtail catheter was then advanced over the wire into the superior mesenteric vein  and wire removed. Portal venogram was performed which demonstrated occlusive thrombus throughout  the central superior mesenteric vein and main portal vein. The tract was then dilated to 8 mm with an 8 mm x 8 cm Athletis balloon. A 8-10 mm by 7 + 2 cm of Viatorr endograft was placed. This was dilated to 8 mm. The indwelling 10 French sheath was then exchanged for a 16 French, 33 cm dry seal sheath. The sheath was directed to the portal aspect of the endograft. Aspiration thrombectomy was then performed in multiple passes over the wire through the main portal vein and into the central superior mesenteric vein. Moderate acute and chronic appearing thrombus were collected in the aspiration canister. A sample was sent for pathology. Repeat portal venogram demonstrated persistent occlusive appearing thrombus in the main portal vein and irregular luminal filling defects in the central superior mesenteric vein. Therefore, balloon angioplasty was performed with a 6 mm x 8 cm Athletis balloon. Repeat portal venogram demonstrated no evidence of extravasation in mildly restored luminal gain in the central superior mesenteric vein. This region was then treated with a 6 mm x 8 cm In.Pact drug coated balloon prolonged inflation for total of 3 minutes. After angioplasty, the central superior mesenteric vein appear patent with inline flow into the indwelling tips endograft. Additional balloon angioplasty through the tips endograft within 8 mm balloon was performed. Repeat portal venogram demonstrated minimal improved patency. Additional aspiration thrombectomy was performed through the tips endograft which yielded moderate acute appearing thrombus. Repeat portal venogram demonstrated persistent near occlusive thrombus throughout the indwelling tips endograft in main portal vein. Therefore, mechanical thrombectomy was performed through the tips endograft and main portal vein into the central superior mesenteric vein with a 6 [place] cleaner device undergoing multiple passes. Repeat portal venogram demonstrated improved patency  and inline flow via the indwelling tips, however with multiple irregular luminal filling defects and sluggish flow. Retrograde flow was noted into the left gastric vein which filled multiple small gastroesophageal varices. The indwelling tips endograft was then dilated with a 10 mm x 80 mm Athletis balloon. Repeat portal venogram demonstrated improved patency and antegrade flow through the indwelling endograft. There is persistent retrograde filling of the left gastric vein. Therefore, a 5 French C2 catheter was used to select the left gastric vein. Left gastric venogram demonstrated retrograde flow with opacification and irregular vascularity about multifocal small gastroesophageal varices. Therefore, coil embolization was performed about the central left gastric vein with 2, 6 mm 0.035 "Azur detachable coils. Completion portal venogram demonstrated successfully embolized left gastric vein with antegrade flow via patent main portal vein in newly placed TIPS endograft, however several scattered filling defects remain in the main portal vein and in the endograft. The catheters and sheath were removed and manual compression was applied to the right internal jugular and right greater saphenous venous access sites until hemostasis was achieved. The patient was administered 60 mg of subcutaneous Lovenox. The patient was transferred to the PACU in stable condition. IMPRESSION: 1. 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 2. 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 3. Technically successful aspiration and mechanical thrombectomy of the superior mesenteric and main portal veins. 4. Technically successful plain and drug coated balloon angioplasty of the superior mesenteric and main portal veins. 5. Technically successful coil embolization of the left gastric vein. PLAN: Begin therapeutic Lovenox  for 1 month prior to transitioning back to oral anticoagulation. IR  will follow while inpatient and arrange outpatient followup within 1 month after discharge. Marliss Coots, MD [facility] Electronically Signed   By: Marliss Coots M.D.   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 EXAM: 1. Ultrasound-guided access of the right internal jugular vein 2. Ultrasound-guided access of the right greater saphenous vein 3. Hepatic venogram 4. Intravascular ultrasound 5. Catheterization of the portal vein 6. Portal venogram 7. Creation of a transhepatic portal vein to hepatic vein shunt 8. Aspiration thrombectomy of the portal vein and superior mesenteric vein 9. Mechanical thrombectomy of the portal vein and superior mesenteric vein 10. Drug coated balloon angioplasty of the main portal vein and superior mesenteric vein 11. Embolization of left gastric vein MEDICATIONS: As antibiotic prophylaxis, Rocephin 1 gm IV was ordered pre-procedure and administered intravenously within one hour of incision. ANESTHESIA/SEDATION: General - as administered by the Anesthesia department CONTRAST:  One hundred ML Omnipaque 300, intravenous FLUOROSCOPY TIME:  2,580 mGy COMPLICATIONS: None immediate. PROCEDURE: The procedure was performed in concert with my partner Dr. Katherina Right. Informed written consent was obtained from the patient after a thorough discussion of the procedural risks, benefits and alternatives. All questions were addressed. Maximal Sterile Barrier Technique was utilized including caps, mask, sterile gowns, sterile gloves, sterile drape, hand hygiene and skin antiseptic. A timeout was performed prior to the initiation of the procedure. A preliminary  ultrasound of the right groin was performed and demonstrates a patent right common femoral vein and central greater saphenous vein. A permanent ultrasound image was recorded. Using a combination of fluoroscopy and ultrasound, an access site was determined. A small dermatotomy was made at the planned puncture site. Using ultrasound guidance, access into the right greater saphenous vein was obtained with visualization of needle entry into the vessel using a standard micropuncture technique. A wire was advanced into the IVC insert all fascial dilation performed. An 8 [place], 11 cm vascular sheath was placed into the external iliac vein. Through this access site, an 71 [place] ICE catheter was advanced with ease under fluoroscopic guidance to the level of the intrahepatic inferior vena cava. A preliminary ultrasound of the right neck was performed and demonstrates a patent internal jugular vein. A permanent ultrasound image was recorded. Using a combination of fluoroscopy and ultrasound, an access site was determined. A small dermatotomy was made at the planned puncture site. Using ultrasound guidance, access into the right internal jugular vein was obtained with visualization of needle entry into the vessel using a standard micropuncture technique. A wire was advanced into the IVC and serial fascial dilation performed. A 10 French tips sheath was placed into the internal jugular vein and advanced to the IVC. The jugular sheath was retracted into the right atrium and manometry was performed measuring a mean pressure of 11 mmHg. A 5 French angled tip catheter was then directed into the right hepatic vein. Hepatic venogram was performed. These images demonstrated patent hepatic vein with no stenosis. The catheter was advanced to a wedge portion of the a patent vein over which the 10 French sheath was advanced into the right hepatic vein. Using ICE ultrasound visualization the catheter as right hepatic vein as well  as the portal anatomy was defined. There was acute appearing, mildly expansile heterogeneously hypoechoic thrombus throughout the  portal veins. A planned exit site from the hepatic vein and puncture site from the portal vein was placed into a single sonographic plane. Under direct ultrasound visualization, the ScorpionX needle was advanced into the central right portal vein. A Glidewire Advantage was then advanced however coursed laterally in appeared to follow and extrahepatic location. Upon manipulating to preserve portal venous access, hepatic vein access was lost with the base sheath. Therefore, the jugular sheath was then retracted into the right atrium and a 5 French angled tip catheter was directed into the middle hepatic vein. Hepatic venogram was performed. These images demonstrated patent hepatic vein with no stenosis. The catheter was advanced to a wedged portion of the patent vein over which the 10 French sheath was advanced into the middle hepatic vein. Using ICE ultrasound visualization a planned exit site from the hepatic vein and portal puncture site were placed into a single sonographic plane. Under direct ultrasound visualization, the ScorpionX needle was advanced into the central left portal vein. There is moderate redundancy in looping of the wire in the left portal vein prior to entering the main portal vein which required several manipulation techniques to reduce including balloon dilation and insertion of a stiff buddy wire. A 5 French marking pigtail catheter was then advanced over the wire into the superior mesenteric vein and wire removed. Portal venogram was performed which demonstrated occlusive thrombus throughout the central superior mesenteric vein and main portal vein. The tract was then dilated to 8 mm with an 8 mm x 8 cm Athletis balloon. A 8-10 mm by 7 + 2 cm of Viatorr endograft was placed. This was dilated to 8 mm. The indwelling 10 French sheath was then exchanged for a 16  French, 33 cm dry seal sheath. The sheath was directed to the portal aspect of the endograft. Aspiration thrombectomy was then performed in multiple passes over the wire through the main portal vein and into the central superior mesenteric vein. Moderate acute and chronic appearing thrombus were collected in the aspiration canister. A sample was sent for pathology. Repeat portal venogram demonstrated persistent occlusive appearing thrombus in the main portal vein and irregular luminal filling defects in the central superior mesenteric vein. Therefore, balloon angioplasty was performed with a 6 mm x 8 cm Athletis balloon. Repeat portal venogram demonstrated no evidence of extravasation in mildly restored luminal gain in the central superior mesenteric vein. This region was then treated with a 6 mm x 8 cm In.Pact drug coated balloon prolonged inflation for total of 3 minutes. After angioplasty, the central superior mesenteric vein appear patent with inline flow into the indwelling tips endograft. Additional balloon angioplasty through the tips endograft within 8 mm balloon was performed. Repeat portal venogram demonstrated minimal improved patency. Additional aspiration thrombectomy was performed through the tips endograft which yielded moderate acute appearing thrombus. Repeat portal venogram demonstrated persistent near occlusive thrombus throughout the indwelling tips endograft in main portal vein. Therefore, mechanical thrombectomy was performed through the tips endograft and main portal vein into the central superior mesenteric vein with a 6 [place] cleaner device undergoing multiple passes. Repeat portal venogram demonstrated improved patency and inline flow via the indwelling tips, however with multiple irregular luminal filling defects and sluggish flow. Retrograde flow was noted into the left gastric vein which filled multiple small gastroesophageal varices. The indwelling tips endograft was then dilated with  a 10 mm x 80 mm Athletis balloon. Repeat portal venogram demonstrated improved patency and antegrade flow through the indwelling endograft. There  is persistent retrograde filling of the left gastric vein. Therefore, a 5 French C2 catheter was used to select the left gastric vein. Left gastric venogram demonstrated retrograde flow with opacification and irregular vascularity about multifocal small gastroesophageal varices. Therefore, coil embolization was performed about the central left gastric vein with 2, 6 mm 0.035 "Azur detachable coils. Completion portal venogram demonstrated successfully embolized left gastric vein with antegrade flow via patent main portal vein in newly placed TIPS endograft, however several scattered filling defects remain in the main portal vein and in the endograft. The catheters and sheath were removed and manual compression was applied to the right internal jugular and right greater saphenous venous access sites until hemostasis was achieved. The patient was administered 60 mg of subcutaneous Lovenox. The patient was transferred to the PACU in stable condition. IMPRESSION: 1. 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 2. 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 3. Technically successful aspiration and mechanical thrombectomy of the superior mesenteric and main portal veins. 4. Technically successful plain and drug coated balloon angioplasty of the superior mesenteric and main portal veins. 5. Technically successful coil embolization of the left gastric vein. PLAN: Begin therapeutic Lovenox for 1 month prior to transitioning back to oral anticoagulation. IR will follow while inpatient and arrange outpatient followup within 1 month after discharge. Marliss Coots, MD [facility] Electronically Signed   By:  Marliss Coots M.D.   On: 02/09/2023 22:10   IR EMBO VENOUS NOT HEMORR HEMANG  INC GUIDE ROADMAPPING Result Date: 02/09/2023 CLINICAL DATA:  78 year old female with history of pancreatic cancer status post radiation with development of progressive acute portal vein thrombus and recurrent gastric hemorrhage. EXAM: 1. Ultrasound-guided access of the right internal jugular vein 2. Ultrasound-guided access of the right greater saphenous vein 3. Hepatic venogram 4. Intravascular ultrasound 5. Catheterization of the portal vein 6. Portal venogram 7. Creation of a transhepatic portal vein to hepatic vein shunt 8. Aspiration thrombectomy of the portal vein and superior mesenteric vein 9. Mechanical thrombectomy of the portal vein and superior mesenteric vein 10. Drug coated balloon angioplasty of the main portal vein and superior mesenteric vein 11. Embolization of left gastric vein MEDICATIONS: As antibiotic prophylaxis, Rocephin 1 gm IV was ordered pre-procedure and administered intravenously within one hour of incision. ANESTHESIA/SEDATION: General - as administered by the Anesthesia department CONTRAST:  One hundred ML Omnipaque 300, intravenous FLUOROSCOPY TIME:  2,580 mGy COMPLICATIONS: None immediate. PROCEDURE: The procedure was performed in concert with my partner Dr. Katherina Right. Informed written consent was obtained from the patient after a thorough discussion of the procedural risks, benefits and alternatives. All questions were addressed. Maximal Sterile Barrier Technique was utilized including caps, mask, sterile gowns, sterile gloves, sterile drape, hand hygiene and skin antiseptic. A timeout was performed prior to the initiation of the procedure. A preliminary ultrasound of the right groin was performed and demonstrates a patent right common femoral vein and central greater saphenous vein. A permanent ultrasound image was recorded. Using a combination of fluoroscopy and ultrasound, an access site was  determined. A small dermatotomy was made at the planned puncture site. Using ultrasound guidance, access into the right greater saphenous vein was obtained with visualization of needle entry into the vessel using a standard micropuncture technique. A wire was advanced into the IVC insert all fascial dilation performed. An 8 [place], 11 cm vascular sheath was placed into  the external iliac vein. Through this access site, an 39 [place] ICE catheter was advanced with ease under fluoroscopic guidance to the level of the intrahepatic inferior vena cava. A preliminary ultrasound of the right neck was performed and demonstrates a patent internal jugular vein. A permanent ultrasound image was recorded. Using a combination of fluoroscopy and ultrasound, an access site was determined. A small dermatotomy was made at the planned puncture site. Using ultrasound guidance, access into the right internal jugular vein was obtained with visualization of needle entry into the vessel using a standard micropuncture technique. A wire was advanced into the IVC and serial fascial dilation performed. A 10 French tips sheath was placed into the internal jugular vein and advanced to the IVC. The jugular sheath was retracted into the right atrium and manometry was performed measuring a mean pressure of 11 mmHg. A 5 French angled tip catheter was then directed into the right hepatic vein. Hepatic venogram was performed. These images demonstrated patent hepatic vein with no stenosis. The catheter was advanced to a wedge portion of the a patent vein over which the 10 French sheath was advanced into the right hepatic vein. Using ICE ultrasound visualization the catheter as right hepatic vein as well as the portal anatomy was defined. There was acute appearing, mildly expansile heterogeneously hypoechoic thrombus throughout the portal veins. A planned exit site from the hepatic vein and puncture site from the portal vein was placed into a  single sonographic plane. Under direct ultrasound visualization, the ScorpionX needle was advanced into the central right portal vein. A Glidewire Advantage was then advanced however coursed laterally in appeared to follow and extrahepatic location. Upon manipulating to preserve portal venous access, hepatic vein access was lost with the base sheath. Therefore, the jugular sheath was then retracted into the right atrium and a 5 French angled tip catheter was directed into the middle hepatic vein. Hepatic venogram was performed. These images demonstrated patent hepatic vein with no stenosis. The catheter was advanced to a wedged portion of the patent vein over which the 10 French sheath was advanced into the middle hepatic vein. Using ICE ultrasound visualization a planned exit site from the hepatic vein and portal puncture site were placed into a single sonographic plane. Under direct ultrasound visualization, the ScorpionX needle was advanced into the central left portal vein. There is moderate redundancy in looping of the wire in the left portal vein prior to entering the main portal vein which required several manipulation techniques to reduce including balloon dilation and insertion of a stiff buddy wire. A 5 French marking pigtail catheter was then advanced over the wire into the superior mesenteric vein and wire removed. Portal venogram was performed which demonstrated occlusive thrombus throughout the central superior mesenteric vein and main portal vein. The tract was then dilated to 8 mm with an 8 mm x 8 cm Athletis balloon. A 8-10 mm by 7 + 2 cm of Viatorr endograft was placed. This was dilated to 8 mm. The indwelling 10 French sheath was then exchanged for a 16 French, 33 cm dry seal sheath. The sheath was directed to the portal aspect of the endograft. Aspiration thrombectomy was then performed in multiple passes over the wire through the main portal vein and into the central superior mesenteric vein.  Moderate acute and chronic appearing thrombus were collected in the aspiration canister. A sample was sent for pathology. Repeat portal venogram demonstrated persistent occlusive appearing thrombus in the main portal vein and irregular  luminal filling defects in the central superior mesenteric vein. Therefore, balloon angioplasty was performed with a 6 mm x 8 cm Athletis balloon. Repeat portal venogram demonstrated no evidence of extravasation in mildly restored luminal gain in the central superior mesenteric vein. This region was then treated with a 6 mm x 8 cm In.Pact drug coated balloon prolonged inflation for total of 3 minutes. After angioplasty, the central superior mesenteric vein appear patent with inline flow into the indwelling tips endograft. Additional balloon angioplasty through the tips endograft within 8 mm balloon was performed. Repeat portal venogram demonstrated minimal improved patency. Additional aspiration thrombectomy was performed through the tips endograft which yielded moderate acute appearing thrombus. Repeat portal venogram demonstrated persistent near occlusive thrombus throughout the indwelling tips endograft in main portal vein. Therefore, mechanical thrombectomy was performed through the tips endograft and main portal vein into the central superior mesenteric vein with a 6 [place] cleaner device undergoing multiple passes. Repeat portal venogram demonstrated improved patency and inline flow via the indwelling tips, however with multiple irregular luminal filling defects and sluggish flow. Retrograde flow was noted into the left gastric vein which filled multiple small gastroesophageal varices. The indwelling tips endograft was then dilated with a 10 mm x 80 mm [employer] balloon. Repeat portal venogram demonstrated improved patency and antegrade flow through the indwelling endograft. There is persistent retrograde filling of the left gastric vein. Therefore, a 5 French C2 catheter was  used to select the left gastric vein. Left gastric venogram demonstrated retrograde flow with opacification and irregular vascularity about multifocal small gastroesophageal varices. Therefore, coil embolization was performed about the central left gastric vein with 2, 6 mm 0.035 "[place]ur detachable coils. Completion portal venogram demonstrated successfully embolized left gastric vein with antegrade flow via patent main portal vein in newly placed TIPS endograft, however several scattered filling defects remain in the main portal vein and in the endograft. The catheters and sheath were removed and manual compression was applied to the right internal jugular and right greater saphenous venous access sites until hemostasis was achieved. The patient was administered 60 mg of subcutaneous Lovenox. The patient was transferred to the PACU in stable condition. IMPRESSION: 1. 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 2. 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 3. Technically successful aspiration and mechanical thrombectomy of the superior mesenteric and main portal veins. 4. Technically successful plain and drug coated balloon angioplasty of the superior mesenteric and main portal veins. 5. Technically successful coil embolization of the left gastric vein. PLAN: Begin therapeutic Lovenox for 1 month prior to transitioning back to oral anticoagulation. IR will follow while inpatient and arrange outpatient followup within 1 month after discharge. Marliss Coots, MD [facility] Radiology Electronically Signed   By: Marliss Coots M.D.   On: 02/09/2023 22:10   CT ABDOMEN PELVIS W CONTRAST Result Date: 02/06/2023 CLINICAL DATA:  Pancreatitis, acute, severe Patient presents with abdominal pain. Radiologic records indicates history of pancreatic cancer.  EXAM: CT ABDOMEN AND PELVIS WITH CONTRAST TECHNIQUE: Multidetector CT imaging of the abdomen and pelvis was performed using the standard protocol following bolus administration of intravenous contrast. RADIATION DOSE REDUCTION: This exam was performed according to the departmental dose-optimization program which includes automated exposure control, adjustment of the mA and/or kV according to patient size and/or use of iterative reconstruction technique. CONTRAST:  75mL OMNIPAQUE IOHEXOL 350 MG/ML SOLN COMPARISON:  CT 8 days ago.  PET CT 10/28/2022 reviewed FINDINGS: Lower chest: Again seen linear atelectasis or scarring in the left greater than right lower lobe. 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 No pleural effusion. Hepatobiliary: Tiny hypodensity in the right lobe of the liver is unchanged series 3, image 9. No new intrahepatic abnormality. There is progressive portal vein thrombus, increasing volume of intrahepatic portal vein thrombus. The gallbladder is not well seen. Common bile duct is poorly defined on the current exam. Pancreas: Pancreatic atrophy with ductal dilatation, unchanged over the last 8 days. The known hypodense pancreatic mass is grossly unchanged measuring 2.2 cm series 3, image 23. This is less well-defined than on prior exam. Lesion abuts the SMV with progressive SMV thrombus. Peripancreatic fat stranding about the pancreatic head is minor. Spleen: Bilobed appearance of the spleen. No focal splenic abnormality. Adrenals/Urinary Tract: No adrenal nodule. Bilateral parapelvic cysts. No further follow-up imaging is recommended. No hydronephrosis or renal inflammation. Moderate bladder distension, no wall thickening. Stomach/Bowel: Detailed bowel assessment is limited in the absence of enteric contrast. Paraesophageal varices. Wall thickening about the distal stomach, reference series 3, image 21. Again seen wall thickening of the duodenum with adjacent stranding, for example  series 3, image 25. No small bowel obstruction or additional small bowel wall thickening. No small bowel pneumatosis. There is wall thickening about the hepatic flexure of the colon series 3, image 26, increased from prior exam. Transverse colon is redundant. Small to moderate colonic stool burden. Vascular/Lymphatic: Progressive thrombus within the superior mesenteric vein which is now occlusive. Progressive thrombus within the main and intrahepatic portal veins. The splenic vein remains patent. Increasing portosystemic collaterals. Aortic atherosclerosis. No bulky adenopathy. Reproductive: Nonacute. Other: Right upper quadrant fat stranding which is adjacent to gastric, duodenal and colonic wall thickening. No frank ascites. No free air. Right lateral lumbar hernia contains only fat. Musculoskeletal: Posterior rod with intrapedicular screw fusion L2 through L5. Additional multilevel degenerative change in the spine. No evidence of focal bone lesion. IMPRESSION: 1. Progressive thrombus within the superior mesenteric vein over the last 8 days, now occlusive. Progressive thrombus within the main and intrahepatic portal veins. 2. Known hypodense pancreatic mass is grossly unchanged measuring 2.2 cm. This is less well-defined than on prior exam. 3. Peripancreatic fat stranding is mild. 4. Persistent and progressive wall thickening about the distal stomach, duodenum, and hepatic flexure of the colon. Progressive right upper quadrant fat stranding. Etiology of bowel wall thickening and inflammation is indeterminate. 5. Increasing portosystemic collaterals. Paraesophageal varices. Aortic Atherosclerosis (ICD10-I70.0). Electronically Signed   By: Narda Rutherford M.D.   On: 02/06/2023 17:21    Labs:  CBC: Recent Labs    02/08/23 0329 02/09/23 0404 02/10/23 0040 02/10/23 1147  WBC 3.5* 3.7* 8.2 8.6  HGB 8.0* 8.1* 7.1* 6.7*  HCT 25.3* 26.1* 22.4* 20.9*  PLT 133* 157 171 134*    COAGS: Recent Labs     12/27/22 1923 01/29/23 1137 02/09/23 0404  INR 1.1 0.9 1.1  APTT 58*  --   --     BMP: Recent Labs    02/07/23 0208 02/08/23 0329 02/09/23 0404 02/10/23 0040  NA 139 140 141 140  K 3.6 3.6 3.6 3.5  CL 111 112* 111 108  CO2 20* 23 22 22   GLUCOSE 114* 136* 109* 130*  BUN 17 9 9 14   CALCIUM 8.5* 8.4* 8.5* 8.0*  CREATININE 0.74 0.73 0.84 0.93  GFRNONAA >60 >60 >60 >60    LIVER FUNCTION TESTS: Recent Labs  02/06/23 1214 02/08/23 0329 02/09/23 0404 02/10/23 0040  BILITOT 0.4 0.6 0.6 0.7  AST 34 23 22 91*  ALT 40 28 27 82*  ALKPHOS 110 89 100 89  PROT 5.8* 5.0* 5.3* 5.2*  ALBUMIN 3.0* 2.5* 2.6* 2.7*    Assessment and Plan:  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 S/p TIPS creation, aspiration and mechanical portal thrombectomy, main portal vein and SMV drug coated balloon angioplasty and embolization of left gastric vein.    Patient is drowsy this morning but able to answer questions appropriately. She is complaining of generalized abdominal discomfort which she attributes to gas pains. Her procedural vascular sites are clean, soft, dry and non-tender. She is afebrile with stable vital signs. Hemoglobin level decreased overnight: 8.1 -->6.7.   Ok to remove gauze and tegaderm tomorrow morning. Monitor H&H. Monitor for signs/symptoms of hepatic encephalopathy. Patient not currently receiving lactulose. Patient will be scheduled for one month follow up with Dr. Elby Showers.   IR will continue to follow while inpatient.   Electronically Signed: Alwyn Ren, AGACNP-BC 02/11/2023, 3:08 PM   I spent a total of 15 Minutes at the the patient's bedside AND on the patient's hospital floor or unit, greater than 50% of which was counseling/coordinating care for SMV and portal vein thrombus with recurrent GI bleeding.

## 2023-02-11 NOTE — Progress Notes (Signed)
Daily Progress Note   Patient Name: Sheryl Bender       Date: 02/11/2023 DOB: [birth year]  Age: 78 y.o. MRN#: [id number] Attending Physician: Leroy Sea, MD Primary Care Physician: [name], DO Admit Date: 02/06/2023 Length of Stay: 5 days  Reason for Consultation/Follow-up: Establishing goals of care  HPI/Patient Profile:  78 y.o. female  with past medical history of Stage 1 pancreatic cancer s/p chemoradiation, pulmonary embolism on Eliquis, iron-deficiency anemia, interstitial pancreatitis, HTN, recurrent GI bleed who presents with melena and weakness.  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    Palliative medicine was consulted for GOC conversations.  Subjective:   Subjective: Chart Reviewed. Updates received. Patient Assessed. Created space and opportunity for patient  and family to explore thoughts and feelings regarding current medical situation.  Today's Discussion: Today I saw the patient at the bedside, she was sitting in the bedside chair. Her son was present. We celebrated that she looks much improved. We spent time discussing her procedure and post-procedure. She feels much better. Her son feels she looks a lot better, has more life and energy in her. I assisted her in ordering her dinner for this evening.  We discussed the plan for likely another night in the hospital, continue to monitor labs, possibly home tomorrow. They are hopeful that the procedures will help with the recurrent bleeding. They are planning to follow-up with oncology as an outpatient.  We discussed engaging with palliative as an outpatient with her oncologist (Dr. Kirtland Bouchard) for ongoing support as her cancer picture and care options evolve. They are agreeable.  I shared that given her successful procedure and  clear plan and goals, there's no need for ongoing palliative support. They expressed appreciation for out help. I provided emotional and general support through therapeutic listening, empathy, sharing of stories, therapeutic touch, and other techniques. I answered all questions and addressed all concerns to the best of my ability.  Review of Systems  Constitutional:        Feels better overall  Respiratory:  Negative for chest tightness and shortness of breath.   Gastrointestinal:  Negative for abdominal pain, nausea and vomiting.    Objective:   Vital Signs:  BP (!) 119/59 (BP Location: Left Arm)   Pulse 75   Temp 98 F (36.7 C) (Oral)   Resp (!) 23   Ht 5' 2.5" (1.588 m)   Wt 59 kg   SpO2 97%   BMI 23.40 kg/m   Physical Exam Vitals and nursing note reviewed.  Constitutional:      General: She is not in acute distress. HENT:     Head: Normocephalic and atraumatic.  Pulmonary:     Effort: Pulmonary effort is normal. No respiratory distress.  Abdominal:     General: Abdomen is flat.  Skin:    General: Skin is warm and dry.     Coloration: Skin is not jaundiced.  Neurological:     General: No focal deficit present.     Mental Status: She is alert.  Psychiatric:        Mood and Affect: Mood normal.        Behavior: Behavior normal.     Palliative Assessment/Data: 80-90%    Existing Vynca/ACP Documentation: None  Assessment & Plan:   Impression: Present on Admission:  GI bleed  Superior mesenteric vein thrombosis (HCC)  Depression  Acute blood loss anemia  Malignant neoplasm of pancreas [facility])  Essential hypertension  78 year old female with acute presentation chronic comorbidities as described above.  The patient underwent successful TIPS/thrombectomy and is feeling much better/stonger.  On admission/during hospitalization she was told that given her significant weakness she is not a candidate for ongoing cancer treatments.  However, if her performance  status improves that this could change.  She does actively see [facility] for cancer treatment.  She also sees Dr. Kirtland Bouchard locally.  The patient states that she is open to all available and offered treatments.  Previously elected DNR (except when in procedure), no feeding tube.  Son [name] is her HCPOA and fully understands her wishes.  Overall prognosis guarded to poor.  SUMMARY OF RECOMMENDATIONS   DNR/Limited No feeding tubes Continue current scope of treatment Anticipate d/c in the next 24-48 hours Goals are clear at this time Palliative medicine will back off for now Please notify us of any significant clinical change or new palliative needs  Symptom Management:  Per primary team PMT is available to assist as needed  Code Status: DNR-limited  Prognosis: Unable to determine  Discharge Planning:  Home with outpatient follow-up  Discussed with: Patient, son, medical team, nursing team  Thank you for allowing [place] to participate in the care of Deniz Cerullo PMT will continue to support holistically.  Time Total: 45 min  Detailed review of medical records (labs, imaging, vital signs), medically appropriate exam, discussed with treatment team, counseling and education to patient, family, & staff, documenting clinical information, medication management, coordination of care  Wynne Dust, NP Palliative Medicine Team  Team Phone # [phone] (Nights/Weekends)  09/23/2020, 8:17 AM

## 2023-02-11 NOTE — Progress Notes (Signed)
Physical Therapy Treatment Patient Details Name: Sheryl Bender MRN: [id number] DOB: Jul 01, 1945 Today's Date: 02/11/2023   History of Present Illness Pt is 78 year old presented to [facility] on  02/06/23 for recurrence of GI bleed. 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 Underwent EGD and for possible TIPS procedure. PMH - whipple procedure, OA, anxiety, depression, pancreatic adenocarcinoma, PE.    PT Comments  Pt received sitting in the recliner and agreeable to session. Pt appears more alert and reports improved pain and fatigue today compared to previous session.  Pt able to tolerate gait trial without AD this session, however demonstrates increased instability due to low foot clearance and difficulty controlling pace requiring up to min A to prevent LOB.  Pt instructed to use RW at home to improve stability and reduce risk of falls. Pt continues to benefit from PT services to progress toward functional mobility goals.    If plan is discharge home, recommend the following: Assist for transportation;Assistance with cooking/housework   Can travel by private vehicle        Equipment Recommendations  None recommended by PT    Recommendations for Other Services       Precautions / Restrictions Precautions Precautions: Fall Restrictions Weight Bearing Restrictions Per Provider Order: No     Mobility  Bed Mobility               General bed mobility comments: Pt in recliner at beginning and end of session    Transfers Overall transfer level: Needs assistance Equipment used: None Transfers: Sit to/from Stand Sit to Stand: Supervision           General transfer comment: from recliner with supervision for safety    Ambulation/Gait Ambulation/Gait assistance: Contact guard assist, Min assist Gait Distance (Feet): 225 Feet Assistive device: None Gait Pattern/deviations: Step-through pattern, Decreased stride  length, Staggering left, Staggering right, Drifts right/left       General Gait Details: Pt demonstrates some instability without AD requiring CGA and intermittent min A for balance. Pt reports it is difficulty to control pace and demonstrates quick pace intermittently with increased instability      Balance Overall balance assessment: Needs assistance Sitting-balance support: No upper extremity supported, Feet supported Sitting balance-Leahy Scale: Normal     Standing balance support: No upper extremity supported, During functional activity Standing balance-Leahy Scale: Fair Standing balance comment: Pt able to ambulate without AD without LOB, however demonstrates increased instability                            Cognition Arousal: Alert Behavior During Therapy: WFL for tasks assessed/performed Overall Cognitive Status: Within Functional Limits for tasks assessed                                          Exercises      General Comments        Pertinent Vitals/Pain Pain Assessment Pain Assessment: Faces Faces Pain Scale: Hurts a little bit Pain Location: abdomen Pain Descriptors / Indicators: Discomfort Pain Intervention(s): Limited activity within patient's tolerance, Monitored during session     PT Goals (current goals can now be found in the care plan section) Acute Rehab PT Goals Patient Stated Goal: get stronger PT Goal Formulation: With patient Time For Goal Achievement: 02/22/23 Progress towards PT  goals: Progressing toward goals    Frequency    Min 1X/week       AM-PAC PT "6 Clicks" Mobility   Outcome Measure  Help needed turning from your back to your side while in a flat bed without using bedrails?: None Help needed moving from lying on your back to sitting on the side of a flat bed without using bedrails?: None Help needed moving to and from a bed to a chair (including a wheelchair)?: A Little Help needed standing up  from a chair using your arms (e.g., wheelchair or bedside chair)?: A Little Help needed to walk in hospital room?: A Little Help needed climbing 3-5 steps with a railing? : A Little 6 Click Score: 20    End of Session Equipment Utilized During Treatment: Gait belt Activity Tolerance: Patient limited by fatigue;Patient tolerated treatment well Patient left: in chair;with call bell/phone within reach Nurse Communication: Mobility status PT Visit Diagnosis: Unsteadiness on feet (R26.81);Muscle weakness (generalized) (M62.81)     Time: 1610-9604 PT Time Calculation (min) (ACUTE ONLY): 16 min  Charges:    $Gait Training: 8-22 mins PT General Charges $$ ACUTE PT VISIT: 1 Visit                     Johny Shock, PTA Acute Rehabilitation Services Secure Chat Preferred  [phone]    Johny Shock 02/11/2023, 5:12 PM

## 2023-02-11 NOTE — Plan of Care (Signed)

## 2023-02-11 NOTE — Progress Notes (Addendum)
   02/11/23 1203  Mobility  Activity Transferred to/from [facility]  Level of Assistance Standby assist, set-up cues, supervision of patient - no hands on  Assistive Device None  Distance Ambulated (ft) 300 ft  Activity Response Tolerated fair  Mobility Referral Yes  Mobility visit 1 Mobility  Mobility Specialist Start Time (ACUTE ONLY) 1147  Mobility Specialist Stop Time (ACUTE ONLY) 1203  Mobility Specialist Time Calculation (min) (ACUTE ONLY) 16 min   Mobility Specialist: Progress Note  Pre-Mobility: HR 97, SpO2 96% RA Post- Mobility: HR 106, SpO2 95% RA, BP 110/67   Pt agreeable to mobility session - received in bed. Required CG using no AD. C/o "not having a BM" pt will stool on bedpad then had increased urge to use BR when standing. Returned to [facility] with all needs met - call bell within reach. Instructed to press call bell when finished- RN/NT notified. MS will follow up at a later time to ambulate with pt.   Barnie Mort, BS Mobility Specialist Please contact via SecureChat or Rehab office at [phone].

## 2023-02-11 NOTE — Progress Notes (Signed)
   02/11/23 1434  Mobility  Activity Ambulated with assistance in hallway  Level of Assistance Standby assist, set-up cues, supervision of patient - no hands on  Assistive Device Front wheel walker  Distance Ambulated (ft) 300 ft  Activity Response Tolerated fair  Mobility Referral Yes  Mobility visit 1 Mobility  Mobility Specialist Start Time (ACUTE ONLY) 1412  Mobility Specialist Stop Time (ACUTE ONLY) 1434  Mobility Specialist Time Calculation (min) (ACUTE ONLY) 22 min   Mobility Specialist: Progress Note  Pre-Mobility: HR 115, SpO2 97% RA During Mobility: HR 120, SpO2 86-94% RA Post-Mobility: HR 104, SpO2 96% RA  Pt agreeable to mobility session - received in chair. Required SB using RW. C/o winded at EOS. Desat to 86%, quick Spo2 recovery with pursed lip breathing. Returned to chair with all needs met - call bell within reach. Son Present.   Barnie Mort, BS Mobility Specialist Please contact via SecureChat or Rehab office at [phone].

## 2023-02-12 ENCOUNTER — Encounter: Payer: Self-pay | Admitting: Hematology

## 2023-02-12 ENCOUNTER — Other Ambulatory Visit (HOSPITAL_COMMUNITY): Payer: Self-pay

## 2023-02-12 DIAGNOSIS — D62 Acute posthemorrhagic anemia: Secondary | ICD-10-CM | POA: Diagnosis not present

## 2023-02-12 DIAGNOSIS — K922 Gastrointestinal hemorrhage, unspecified: Secondary | ICD-10-CM

## 2023-02-12 LAB — CBC
HCT: 23.8 % — ABNORMAL LOW (ref 36.0–46.0)
HCT: 28.8 % — ABNORMAL LOW (ref 36.0–46.0)
Hemoglobin: 7.9 g/dL — ABNORMAL LOW (ref 12.0–15.0)
Hemoglobin: 9.7 g/dL — ABNORMAL LOW (ref 12.0–15.0)
MCH: 29.5 pg (ref 26.0–34.0)
MCH: 29.6 pg (ref 26.0–34.0)
MCHC: 33.2 g/dL (ref 30.0–36.0)
MCHC: 33.7 g/dL (ref 30.0–36.0)
MCV: 88.8 fL (ref 80.0–100.0)
MCV: 87.8 fL (ref 80.0–100.0)
Platelets: 111 10*3/uL — ABNORMAL LOW (ref 150–400)
Platelets: 117 10*3/uL — ABNORMAL LOW (ref 150–400)
RBC: 2.68 MIL/uL — ABNORMAL LOW (ref 3.87–5.11)
RBC: 3.28 MIL/uL — ABNORMAL LOW (ref 3.87–5.11)
RDW: 17.7 % — ABNORMAL HIGH (ref 11.5–15.5)
RDW: 17.3 % — ABNORMAL HIGH (ref 11.5–15.5)
WBC: 3.2 10*3/uL — ABNORMAL LOW (ref 4.0–10.5)
WBC: 3.6 10*3/uL — ABNORMAL LOW (ref 4.0–10.5)
nRBC: 0 % (ref 0.0–0.2)
nRBC: 0 % (ref 0.0–0.2)

## 2023-02-12 LAB — CBC WITH DIFFERENTIAL/PLATELET
Abs Immature Granulocytes: 0.01 10*3/uL (ref 0.00–0.07)
Basophils Absolute: 0 10*3/uL (ref 0.0–0.1)
Basophils Relative: 0 %
Eosinophils Absolute: 0.1 10*3/uL (ref 0.0–0.5)
Eosinophils Relative: 1 %
HCT: 25 % — ABNORMAL LOW (ref 36.0–46.0)
Hemoglobin: 8.1 g/dL — ABNORMAL LOW (ref 12.0–15.0)
Immature Granulocytes: 0 %
Lymphocytes Relative: 14 %
Lymphs Abs: 0.6 10*3/uL — ABNORMAL LOW (ref 0.7–4.0)
MCH: 28.8 pg (ref 26.0–34.0)
MCHC: 32.4 g/dL (ref 30.0–36.0)
MCV: 89 fL (ref 80.0–100.0)
Monocytes Absolute: 0.5 10*3/uL (ref 0.1–1.0)
Monocytes Relative: 12 %
Neutro Abs: 3.1 10*3/uL (ref 1.7–7.7)
Neutrophils Relative %: 73 %
Platelets: 113 10*3/uL — ABNORMAL LOW (ref 150–400)
RBC: 2.81 MIL/uL — ABNORMAL LOW (ref 3.87–5.11)
RDW: 17.7 % — ABNORMAL HIGH (ref 11.5–15.5)
WBC: 4.2 10*3/uL (ref 4.0–10.5)
nRBC: 0 % (ref 0.0–0.2)

## 2023-02-12 LAB — PREPARE RBC (CROSSMATCH)

## 2023-02-12 LAB — COMPREHENSIVE METABOLIC PANEL
ALT: 50 U/L — ABNORMAL HIGH (ref 0–44)
AST: 31 U/L (ref 15–41)
Albumin: 2.3 g/dL — ABNORMAL LOW (ref 3.5–5.0)
Alkaline Phosphatase: 86 U/L (ref 38–126)
Anion gap: 8 (ref 5–15)
BUN: 11 mg/dL (ref 8–23)
CO2: 22 mmol/L (ref 22–32)
Calcium: 8 mg/dL — ABNORMAL LOW (ref 8.9–10.3)
Chloride: 107 mmol/L (ref 98–111)
Creatinine, Ser: 0.93 mg/dL (ref 0.44–1.00)
GFR, Estimated: >60 mL/min (ref >60)
Glucose, Bld: 89 mg/dL (ref 70–99)
Potassium: 3.3 mmol/L — ABNORMAL LOW (ref 3.5–5.1)
Sodium: 137 mmol/L (ref 135–145)
Total Bilirubin: 0.6 mg/dL (ref 0.0–1.2)
Total Protein: 5 g/dL — ABNORMAL LOW (ref 6.5–8.1)

## 2023-02-12 LAB — BRAIN NATRIURETIC PEPTIDE: B Natriuretic Peptide: 32.2 pg/mL (ref 0.0–100.0)

## 2023-02-12 LAB — MAGNESIUM: Magnesium: 2.1 mg/dL (ref 1.7–2.4)

## 2023-02-12 MED ORDER — SUCRALFATE 1 G PO TABS
1.0000 g | ORAL_TABLET | Freq: Three times a day (TID) | ORAL | 0 refills | Status: DC
  Filled 2023-02-12: qty 56, 14d supply, fill #0

## 2023-02-12 MED ORDER — SODIUM CHLORIDE 0.9% IV SOLUTION: Freq: Once | INTRAVENOUS | Status: AC

## 2023-02-12 MED ORDER — ENOXAPARIN SODIUM 60 MG/0.6ML IJ SOSY
1.0000 mg/kg | PREFILLED_SYRINGE | Freq: Two times a day (BID) | INTRAMUSCULAR | 0 refills | Status: DC
  Filled 2023-02-12: qty 36, 30d supply, fill #0

## 2023-02-12 MED ORDER — MORPHINE SULFATE (PF) 2 MG/ML IV SOLN: 1.0000 mg | Freq: Four times a day (QID) | INTRAVENOUS | Status: DC | PRN

## 2023-02-12 MED ORDER — POTASSIUM CHLORIDE CRYS ER 20 MEQ PO TBCR
40.0000 meq | EXTENDED_RELEASE_TABLET | Freq: Once | ORAL | Status: AC
  Administered 2023-02-12: 40 meq via ORAL
  Filled 2023-02-12: qty 2

## 2023-02-12 NOTE — Plan of Care (Signed)
  Problem: Health Behavior/Discharge Planning: Goal: Ability to manage health-related needs will improve Outcome: Progressing   

## 2023-02-12 NOTE — Progress Notes (Signed)
PROGRESS NOTE                                                                                                                                                                                                             Patient Demographics:    Sheryl Bender, is a 78 y.o. female, DOB - December 04, 1945, WNU:[id number]  Outpatient Primary MD for the patient is [name], DO    LOS - 6  Admit date - 02/06/2023    Chief Complaint  Patient presents with   Abdominal Pain   Weakness       Brief Narrative (HPI from H&P)   78 y.o. female with medical history significant of Stage 1 pancreatic cancer s/p chemoradiation, pulmonary embolism on Eliquis, iron-deficiency anemia, interstitial pancreatitis, HTN, recurrent GI bleed who presents with melena and weakness.    Patient hospitalized from 12/2-12/5 with GI bleed requiring transfusion. 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  Colonoscopy with no active bleeding, minimal amount of old blood in the cecum, 6 mm polyp removed from mid sigmoid colon.    She then resumed Eliquis on 12/11 and was re-admitted from 12/18-12/21 with recurrent GI bleed requiring transfusion. EGD found to have hemorrhagic gastritis, treated with APC, bipolar cautery.  GAVE is also possibility.  GI recommended PPI twice daily for 4 weeks then daily indefinitely. Asked to resume Eliquis on 12/28.   Hospitalized again 1/4 -1/8 for GI bleed. EGD Gastritis with hemorrhage. Treated with argon plasma coagulation (APC). Non-bleeding gastric ulcers with no stigmata of bleeding. Duodenitis. CT abdomen and pelvis with contrast-question of the nonocclusive thrombus in the SMV but most significant thrombus not seen in the portal vein including the main portal vein. low-density masslike area along the uncinate process with associated severe ductal dilatation in  caliber change. Plan was for IR thrombectomy on 1/21 and to hold any systemic anticoagulation.   She now presented to the ER on 02/06/2023 for reoccurrence of GI bleed.   Subjective:   Patient in bed, appears comfortable, denies any headache, no fever, no chest pain or pressure, no shortness of breath , improved abdominal pain. No new focal weakness.   Assessment  & Plan :    Recurrent upper GI bleed fourth episode in the last 4 weeks, bleeding likely being  caused by.  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  Seen by GI underwent EGD on 02/07/2023 by Dr. Leonides Schanz, found bleeding GAVE in the antrum treated with APC, also several Forrest class III ulcers in gastric antrum.  On PPI, Carafate, liquid diet, GI on board.  She underwent TIPS procedure, aspiration of portal vein thrombus, SMV balloon angioplasty along with embolization of left gastric vein by IR on 02/09/2023.  Has been placed on full anticoagulation with Lovenox by IR postprocedure on 02/09/2023 x 1 mth then Eliquis.  Has some postprocedure pain.  Seen by IR again on 02/10/2023.  GI and oncology will see her again on 02/10/2023 and appoint.  Patient has had issues with recurrent GI bleed as above, post TIPS procedure on 02/09/2023 H&H has dropped some, some blood loss can be expected from the TIPS procedure itself,.  Monitor CBC for another 24 hours.  Her postop abdominal discomfort and pain have improved, overall she is feeling better.    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 CT abdomen shows progressive thrombus in SMV into portal vein.  Kindly see above.  Patient's pancreatic cancer was being treated at [facility] by  [name], was currently being monitored off of chemotherapy for the last 3 months, she also went to [facility] for a  Whipple's procedure but it could not be done due to the location of her tumor.  Unfortunately CA 19.9 is now trending up and currently quite high, case discussed with [name] oncologist on 02/09/2023, also discussed her case with her primary oncologist [name] on 02/10/2023.  Plan is to monitor with supportive care, if she stabilizes then discharge her home with outpatient follow-up with her oncologist in 1 week, clinically she is improving.  Will have her follow-up with her oncologist for long-term goals of care discussion and plan.  Malignant neoplasm of pancreas (HCC) CA19-9 has increased from normal 4 months ago to 220 this week.  Kindly see above  Essential hypertension BP normal, diet controlled  Depression - Continue bupropion, Buspar, Lexapro and mirtazapine       Condition - Extremely Guarded  Family Communication  : Discussed with patient's son [name] [phone]  on 02/08/2023 at 10:15 AM, called son on 02/10/2023 at 10:20 AM and message left  Code Status : Full code  Consults  : GI, IR, oncology, palliative care  PUD Prophylaxis : PPI   Procedures  :     TIPS procedure, aspiration of portal vein thrombus, SMV balloon angioplasty along with embolization of left gastric vein by IR on 02/09/2023  CT - 1. Progressive thrombus within the superior mesenteric vein over the last 8 days, now occlusive. Progressive thrombus within the main and intrahepatic portal veins. 2. Known hypodense pancreatic mass is grossly unchanged measuring 2.2 cm. This is less well-defined than on prior exam. 3. Peripancreatic fat stranding is mild. 4. Persistent and progressive wall thickening about the distal stomach, duodenum, and hepatic flexure of the colon. Progressive right upper quadrant fat stranding. Etiology of bowel wall thickening and inflammation is indeterminate. 5. Increasing portosystemic collaterals. Paraesophageal varices. Aortic Atherosclerosis (ICD10-I70.0)       Disposition Plan  :    Status is: Inpatient   DVT Prophylaxis  :    SCDs Start: 02/06/23 1929   Lab Results  Component Value Date  PLT 113 (L) 02/12/2023    Diet :  Diet Order             Diet Heart Fluid consistency: Thin  Diet effective now                    Inpatient Medications  Scheduled Meds:  buPROPion  300 mg Oral QPC breakfast   busPIRone  20 mg Oral BID   Chlorhexidine Gluconate Cloth  6 each Topical Daily   cholecalciferol  5,000 Units Oral q morning   enoxaparin (LOVENOX) injection  1 mg/kg Subcutaneous Q12H   escitalopram  20 mg Oral QHS   linaclotide  72 mcg Oral QAC breakfast   mirtazapine  15 mg Oral QHS   pantoprazole (PROTONIX) IV  40 mg Intravenous Q12H   polyethylene glycol  17 g Oral Daily   QUEtiapine  25 mg Oral BID   sodium chloride flush  10-40 mL Intracatheter Q12H   sucralfate  1 g Oral TID WC & HS   Continuous Infusions:   PRN Meds:.HYDROcodone-acetaminophen, morphine injection, simethicone, sodium chloride  Antibiotics  :    Anti-infectives (From admission, onward)    Start     Dose/Rate Route Frequency Ordered Stop   02/09/23 1045  cefTRIAXone (ROCEPHIN) 2 g in sodium chloride 0.9 % 100 mL IVPB        2 g 200 mL/hr over 30 Minutes Intravenous To Radiology 02/08/23 1525 02/09/23 1159         Objective:   Vitals:   02/11/23 1200 02/11/23 1950 02/11/23 2339 02/12/23 0424  BP: 110/67 (!) 119/59 (!) 100/44 (!) 97/51  Pulse: (!) 111 75 89 83  Resp: 13 (!) 23 17 (!) 22  Temp:  98 F (36.7 C) 98.3 F (36.8 C) 98.1 F (36.7 C)  TempSrc:  Oral Oral Oral  SpO2: 96% 97% 98% 99%  Weight:      Height:        Wt Readings from Last 3 Encounters:  02/09/23 59 kg  02/03/23 58.6 kg  01/29/23 59.9 kg     Intake/Output Summary (Last 24 hours) at 02/12/2023 0820 Last data filed at 02/12/2023 0300 Gross per 24 hour  Intake --  Output 2156 ml  Net -2156 ml     Physical Exam  Awake Alert, No new F.N deficits,  Normal affect [place].AT,PERRAL Supple Neck, No JVD,   Symmetrical Chest wall movement, Good air movement bilaterally, CTAB RRR,No Gallops,Rubs or new Murmurs,  +ve B.Sounds, Abd Soft, No tenderness,   No Cyanosis, Clubbing or edema       Data Review:    Recent Labs  Lab 02/09/23 0404 02/10/23 0040 02/10/23 1147 02/10/23 2031 02/11/23 0406 02/11/23 1336 02/12/23 0330  WBC 3.7* 8.2 8.6 8.2 5.1 6.8 4.2  HGB 8.1* 7.1* 6.7* 8.6* 8.2* 9.4* 8.1*  HCT 26.1* 22.4* 20.9* 26.7* 25.5* 28.6* 25.0*  PLT 157 171 134* 129* 113* 141* 113*  MCV 94.2 95.7 94.1 89.6 89.8 88.8 89.0  MCH 29.2 30.3 30.2 28.9 28.9 29.2 28.8  MCHC 31.0 31.7 32.1 32.2 32.2 32.9 32.4  RDW 16.3* 16.7* 16.6* 18.1* 18.2* 18.1* 17.7*  LYMPHSABS 0.6* 0.6*  --   --  0.7  --  0.6*  MONOABS 0.6 0.8  --   --  0.6  --  0.5  EOSABS 0.1 0.0  --   --  0.0  --  0.1  BASOSABS 0.0 0.0  --   --  0.0  --  0.0    Recent Labs  Lab 02/08/23 0329 02/09/23 0404 02/10/23 0040 02/11/23 0406 02/12/23 0330  NA 140 141 140 137 137  K 3.6 3.6 3.5 3.7 3.3*  CL 112* 111 108 108 107  CO2 23 22 22 22 22   ANIONGAP 5 8 10 7 8   GLUCOSE 136* 109* 130* 99 89  BUN 9 9 14 15 11   CREATININE 0.73 0.84 0.93 0.94 0.93  AST 23 22 91* 48* 31  ALT 28 27 82* 64* 50*  ALKPHOS 89 100 89 84 86  BILITOT 0.6 0.6 0.7 0.8 0.6  ALBUMIN 2.5* 2.6* 2.7* 2.3* 2.3*  INR  --  1.1  --   --   --   BNP  --  16.5 57.1 27.4 32.2  MG  --  2.1 2.1 2.1 2.1  CALCIUM 8.4* 8.5* 8.0* 7.7* 8.0*      Recent Labs  Lab 02/08/23 0329 02/09/23 0404 02/10/23 0040 02/11/23 0406 02/12/23 0330  INR  --  1.1  --   --   --   BNP  --  16.5 57.1 27.4 32.2  MG  --  2.1 2.1 2.1 2.1  CALCIUM 8.4* 8.5* 8.0* 7.7* 8.0*     Radiology Reports IR Tips Result Date: 02/09/2023 CLINICAL DATA:  78 year old female with history of pancreatic cancer status post radiation with development of progressive acute portal vein thrombus and recurrent gastric hemorrhage. EXAM: 1. Ultrasound-guided  access of the right internal jugular vein 2. Ultrasound-guided access of the right greater saphenous vein 3. Hepatic venogram 4. Intravascular ultrasound 5. Catheterization of the portal vein 6. Portal venogram 7. Creation of a transhepatic portal vein to hepatic vein shunt 8. Aspiration thrombectomy of the portal vein and superior mesenteric vein 9. Mechanical thrombectomy of the portal vein and superior mesenteric vein 10. Drug coated balloon angioplasty of the main portal vein and superior mesenteric vein 11. Embolization of left gastric vein MEDICATIONS: As antibiotic prophylaxis, Rocephin 1 gm IV was ordered pre-procedure and administered intravenously within one hour of incision. ANESTHESIA/SEDATION: General - as administered by the Anesthesia department CONTRAST:  One hundred ML Omnipaque 300, intravenous FLUOROSCOPY TIME:  2,580 mGy COMPLICATIONS: None immediate. PROCEDURE: The procedure was performed in concert with my partner Dr. Katherina Right. Informed written consent was obtained from the patient after a thorough discussion of the procedural risks, benefits and alternatives. All questions were addressed. Maximal Sterile Barrier Technique was utilized including caps, mask, sterile gowns, sterile gloves, sterile drape, hand hygiene and skin antiseptic. A timeout was performed prior to the initiation of the procedure. A preliminary ultrasound of the right groin was performed and demonstrates a patent right common femoral vein and central greater saphenous vein. A permanent ultrasound image was recorded. Using a combination of fluoroscopy and ultrasound, an access site was determined. A small dermatotomy was made at the planned puncture site. Using ultrasound guidance, access into the right greater saphenous vein was obtained with visualization of needle entry into the vessel using a standard micropuncture technique. A wire was advanced into the IVC insert all fascial dilation performed. An 8 [place], 11 cm  vascular sheath was placed into the external iliac vein. Through this access site, an 63 [place] ICE catheter was advanced with ease under fluoroscopic guidance to the level of the intrahepatic inferior vena cava. A preliminary ultrasound of the right neck was performed and demonstrates a patent internal jugular vein. A permanent ultrasound image was recorded. Using a combination of fluoroscopy and ultrasound,  an access site was determined. A small dermatotomy was made at the planned puncture site. Using ultrasound guidance, access into the right internal jugular vein was obtained with visualization of needle entry into the vessel using a standard micropuncture technique. A wire was advanced into the IVC and serial fascial dilation performed. A 10 French tips sheath was placed into the internal jugular vein and advanced to the IVC. The jugular sheath was retracted into the right atrium and manometry was performed measuring a mean pressure of 11 mmHg. A 5 French angled tip catheter was then directed into the right hepatic vein. Hepatic venogram was performed. These images demonstrated patent hepatic vein with no stenosis. The catheter was advanced to a wedge portion of the a patent vein over which the 10 French sheath was advanced into the right hepatic vein. Using ICE ultrasound visualization the catheter as right hepatic vein as well as the portal anatomy was defined. There was acute appearing, mildly expansile heterogeneously hypoechoic thrombus throughout the portal veins. A planned exit site from the hepatic vein and puncture site from the portal vein was placed into a single sonographic plane. Under direct ultrasound visualization, the ScorpionX needle was advanced into the central right portal vein. A Glidewire Advantage was then advanced however coursed laterally in appeared to follow and extrahepatic location. Upon manipulating to preserve portal venous access, hepatic vein access was lost with the  base sheath. Therefore, the jugular sheath was then retracted into the right atrium and a 5 French angled tip catheter was directed into the middle hepatic vein. Hepatic venogram was performed. These images demonstrated patent hepatic vein with no stenosis. The catheter was advanced to a wedged portion of the patent vein over which the 10 French sheath was advanced into the middle hepatic vein. Using ICE ultrasound visualization a planned exit site from the hepatic vein and portal puncture site were placed into a single sonographic plane. Under direct ultrasound visualization, the ScorpionX needle was advanced into the central left portal vein. There is moderate redundancy in looping of the wire in the left portal vein prior to entering the main portal vein which required several manipulation techniques to reduce including balloon dilation and insertion of a stiff buddy wire. A 5 French marking pigtail catheter was then advanced over the wire into the superior mesenteric vein and wire removed. Portal venogram was performed which demonstrated occlusive thrombus throughout the central superior mesenteric vein and main portal vein. The tract was then dilated to 8 mm with an 8 mm x 8 cm Athletis balloon. A 8-10 mm by 7 + 2 cm of Viatorr endograft was placed. This was dilated to 8 mm. The indwelling 10 French sheath was then exchanged for a 16 French, 33 cm dry seal sheath. The sheath was directed to the portal aspect of the endograft. Aspiration thrombectomy was then performed in multiple passes over the wire through the main portal vein and into the central superior mesenteric vein. Moderate acute and chronic appearing thrombus were collected in the aspiration canister. A sample was sent for pathology. Repeat portal venogram demonstrated persistent occlusive appearing thrombus in the main portal vein and irregular luminal filling defects in the central superior mesenteric vein. Therefore, balloon angioplasty was  performed with a 6 mm x 8 cm Athletis balloon. Repeat portal venogram demonstrated no evidence of extravasation in mildly restored luminal gain in the central superior mesenteric vein. This region was then treated with a 6 mm x 8 cm In.Pact drug coated balloon prolonged  inflation for total of 3 minutes. After angioplasty, the central superior mesenteric vein appear patent with inline flow into the indwelling tips endograft. Additional balloon angioplasty through the tips endograft within 8 mm balloon was performed. Repeat portal venogram demonstrated minimal improved patency. Additional aspiration thrombectomy was performed through the tips endograft which yielded moderate acute appearing thrombus. Repeat portal venogram demonstrated persistent near occlusive thrombus throughout the indwelling tips endograft in main portal vein. Therefore, mechanical thrombectomy was performed through the tips endograft and main portal vein into the central superior mesenteric vein with a 6 [place] cleaner device undergoing multiple passes. Repeat portal venogram demonstrated improved patency and inline flow via the indwelling tips, however with multiple irregular luminal filling defects and sluggish flow. Retrograde flow was noted into the left gastric vein which filled multiple small gastroesophageal varices. The indwelling tips endograft was then dilated with a 10 mm x 80 mm Athletis balloon. Repeat portal venogram demonstrated improved patency and antegrade flow through the indwelling endograft. There is persistent retrograde filling of the left gastric vein. Therefore, a 5 French C2 catheter was used to select the left gastric vein. Left gastric venogram demonstrated retrograde flow with opacification and irregular vascularity about multifocal small gastroesophageal varices. Therefore, coil embolization was performed about the central left gastric vein with 2, 6 mm 0.035 "[place]ur detachable coils. Completion portal venogram  demonstrated successfully embolized left gastric vein with antegrade flow via patent main portal vein in newly placed TIPS endograft, however several scattered filling defects remain in the main portal vein and in the endograft. The catheters and sheath were removed and manual compression was applied to the right internal jugular and right greater saphenous venous access sites until hemostasis was achieved. The patient was administered 60 mg of subcutaneous Lovenox. The patient was transferred to the PACU in stable condition. IMPRESSION: 1. 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 2. 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 3. Technically successful aspiration and mechanical thrombectomy of the superior mesenteric and main portal veins. 4. Technically successful plain and drug coated balloon angioplasty of the superior mesenteric and main portal veins. 5. Technically successful coil embolization of the left gastric vein. PLAN: Begin therapeutic Lovenox for 1 month prior to transitioning back to oral anticoagulation. IR will follow while inpatient and arrange outpatient followup within 1 month after discharge. Marliss Coots, MD [facility] Radiology Electronically Signed   By: Marliss Coots M.D.   On: 02/09/2023 22:10   IR US Guide Vasc Access Right Result Date: 02/09/2023 CLINICAL DATA:  78 year old female with history of pancreatic cancer status post radiation with development of progressive acute portal vein thrombus and recurrent gastric hemorrhage. EXAM: 1. Ultrasound-guided access of the right internal jugular vein 2. Ultrasound-guided access of the right greater saphenous vein 3. Hepatic venogram 4. Intravascular ultrasound 5. Catheterization of the portal vein 6. Portal venogram 7. Creation of a transhepatic portal vein to hepatic  vein shunt 8. Aspiration thrombectomy of the portal vein and superior mesenteric vein 9. Mechanical thrombectomy of the portal vein and superior mesenteric vein 10. Drug coated balloon angioplasty of the main portal vein and superior mesenteric vein 11. Embolization of left gastric vein MEDICATIONS: As antibiotic prophylaxis, Rocephin 1 gm IV was ordered pre-procedure and administered intravenously within one hour of incision. ANESTHESIA/SEDATION: General - as administered by the Anesthesia department CONTRAST:  One hundred ML Omnipaque 300, intravenous FLUOROSCOPY TIME:  2,580 mGy  COMPLICATIONS: None immediate. PROCEDURE: The procedure was performed in concert with my partner Dr. Katherina Right. Informed written consent was obtained from the patient after a thorough discussion of the procedural risks, benefits and alternatives. All questions were addressed. Maximal Sterile Barrier Technique was utilized including caps, mask, sterile gowns, sterile gloves, sterile drape, hand hygiene and skin antiseptic. A timeout was performed prior to the initiation of the procedure. A preliminary ultrasound of the right groin was performed and demonstrates a patent right common femoral vein and central greater saphenous vein. A permanent ultrasound image was recorded. Using a combination of fluoroscopy and ultrasound, an access site was determined. A small dermatotomy was made at the planned puncture site. Using ultrasound guidance, access into the right greater saphenous vein was obtained with visualization of needle entry into the vessel using a standard micropuncture technique. A wire was advanced into the IVC insert all fascial dilation performed. An 8 [place], 11 cm vascular sheath was placed into the external iliac vein. Through this access site, an 69 [place] ICE catheter was advanced with ease under fluoroscopic guidance to the level of the intrahepatic inferior vena cava. A preliminary ultrasound of the right neck was  performed and demonstrates a patent internal jugular vein. A permanent ultrasound image was recorded. Using a combination of fluoroscopy and ultrasound, an access site was determined. A small dermatotomy was made at the planned puncture site. Using ultrasound guidance, access into the right internal jugular vein was obtained with visualization of needle entry into the vessel using a standard micropuncture technique. A wire was advanced into the IVC and serial fascial dilation performed. A 10 French tips sheath was placed into the internal jugular vein and advanced to the IVC. The jugular sheath was retracted into the right atrium and manometry was performed measuring a mean pressure of 11 mmHg. A 5 French angled tip catheter was then directed into the right hepatic vein. Hepatic venogram was performed. These images demonstrated patent hepatic vein with no stenosis. The catheter was advanced to a wedge portion of the a patent vein over which the 10 French sheath was advanced into the right hepatic vein. Using ICE ultrasound visualization the catheter as right hepatic vein as well as the portal anatomy was defined. There was acute appearing, mildly expansile heterogeneously hypoechoic thrombus throughout the portal veins. A planned exit site from the hepatic vein and puncture site from the portal vein was placed into a single sonographic plane. Under direct ultrasound visualization, the ScorpionX needle was advanced into the central right portal vein. A Glidewire Advantage was then advanced however coursed laterally in appeared to follow and extrahepatic location. Upon manipulating to preserve portal venous access, hepatic vein access was lost with the base sheath. Therefore, the jugular sheath was then retracted into the right atrium and a 5 French angled tip catheter was directed into the middle hepatic vein. Hepatic venogram was performed. These images demonstrated patent hepatic vein with no stenosis. The catheter  was advanced to a wedged portion of the patent vein over which the 10 French sheath was advanced into the middle hepatic vein. Using ICE ultrasound visualization a planned exit site from the hepatic vein and portal puncture site were placed into a single sonographic plane. Under direct ultrasound visualization, the ScorpionX needle was advanced into the central left portal vein. There is moderate redundancy in looping of the wire in the left portal vein prior to entering the main portal vein which required several manipulation techniques to reduce including  balloon dilation and insertion of a stiff buddy wire. A 5 French marking pigtail catheter was then advanced over the wire into the superior mesenteric vein and wire removed. Portal venogram was performed which demonstrated occlusive thrombus throughout the central superior mesenteric vein and main portal vein. The tract was then dilated to 8 mm with an 8 mm x 8 cm Athletis balloon. A 8-10 mm by 7 + 2 cm of Viatorr endograft was placed. This was dilated to 8 mm. The indwelling 10 French sheath was then exchanged for a 16 French, 33 cm dry seal sheath. The sheath was directed to the portal aspect of the endograft. Aspiration thrombectomy was then performed in multiple passes over the wire through the main portal vein and into the central superior mesenteric vein. Moderate acute and chronic appearing thrombus were collected in the aspiration canister. A sample was sent for pathology. Repeat portal venogram demonstrated persistent occlusive appearing thrombus in the main portal vein and irregular luminal filling defects in the central superior mesenteric vein. Therefore, balloon angioplasty was performed with a 6 mm x 8 cm Athletis balloon. Repeat portal venogram demonstrated no evidence of extravasation in mildly restored luminal gain in the central superior mesenteric vein. This region was then treated with a 6 mm x 8 cm In.Pact drug coated balloon prolonged  inflation for total of 3 minutes. After angioplasty, the central superior mesenteric vein appear patent with inline flow into the indwelling tips endograft. Additional balloon angioplasty through the tips endograft within 8 mm balloon was performed. Repeat portal venogram demonstrated minimal improved patency. Additional aspiration thrombectomy was performed through the tips endograft which yielded moderate acute appearing thrombus. Repeat portal venogram demonstrated persistent near occlusive thrombus throughout the indwelling tips endograft in main portal vein. Therefore, mechanical thrombectomy was performed through the tips endograft and main portal vein into the central superior mesenteric vein with a 6 [place] cleaner device undergoing multiple passes. Repeat portal venogram demonstrated improved patency and inline flow via the indwelling tips, however with multiple irregular luminal filling defects and sluggish flow. Retrograde flow was noted into the left gastric vein which filled multiple small gastroesophageal varices. The indwelling tips endograft was then dilated with a 10 mm x 80 mm Athletis balloon. Repeat portal venogram demonstrated improved patency and antegrade flow through the indwelling endograft. There is persistent retrograde filling of the left gastric vein. Therefore, a 5 French C2 catheter was used to select the left gastric vein. Left gastric venogram demonstrated retrograde flow with opacification and irregular vascularity about multifocal small gastroesophageal varices. Therefore, coil embolization was performed about the central left gastric vein with 2, 6 mm 0.035 "Azur detachable coils. Completion portal venogram demonstrated successfully embolized left gastric vein with antegrade flow via patent main portal vein in newly placed TIPS endograft, however several scattered filling defects remain in the main portal vein and in the endograft. The catheters and sheath were removed and manual  compression was applied to the right internal jugular and right greater saphenous venous access sites until hemostasis was achieved. The patient was administered 60 mg of subcutaneous Lovenox. The patient was transferred to the PACU in stable condition. IMPRESSION: 1. 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 2. 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 3. Technically successful aspiration and mechanical thrombectomy of the superior mesenteric and main portal veins. 4. Technically successful plain and drug coated balloon angioplasty of the superior mesenteric and main portal veins.  5. Technically successful coil embolization of the left gastric vein. PLAN: Begin therapeutic Lovenox for 1 month prior to transitioning back to oral anticoagulation. IR will follow while inpatient and arrange outpatient followup within 1 month after discharge. Marliss Coots, MD [facility] Radiology Electronically Signed   By: Marliss Coots M.D.   On: 02/09/2023 22:10   IR US Guide Vasc Access Right Result Date: 02/09/2023 CLINICAL DATA:  78 year old female with history of pancreatic cancer status post radiation with development of progressive acute portal vein thrombus and recurrent gastric hemorrhage. EXAM: 1. Ultrasound-guided access of the right internal jugular vein 2. Ultrasound-guided access of the right greater saphenous vein 3. Hepatic venogram 4. Intravascular ultrasound 5. Catheterization of the portal vein 6. Portal venogram 7. Creation of a transhepatic portal vein to hepatic vein shunt 8. Aspiration thrombectomy of the portal vein and superior mesenteric vein 9. Mechanical thrombectomy of the portal vein and superior mesenteric vein 10. Drug coated balloon angioplasty of the main portal vein and superior mesenteric vein 11. Embolization of left  gastric vein MEDICATIONS: As antibiotic prophylaxis, Rocephin 1 gm IV was ordered pre-procedure and administered intravenously within one hour of incision. ANESTHESIA/SEDATION: General - as administered by the Anesthesia department CONTRAST:  One hundred ML Omnipaque 300, intravenous FLUOROSCOPY TIME:  2,580 mGy COMPLICATIONS: None immediate. PROCEDURE: The procedure was performed in concert with my partner Dr. Katherina Right. Informed written consent was obtained from the patient after a thorough discussion of the procedural risks, benefits and alternatives. All questions were addressed. Maximal Sterile Barrier Technique was utilized including caps, mask, sterile gowns, sterile gloves, sterile drape, hand hygiene and skin antiseptic. A timeout was performed prior to the initiation of the procedure. A preliminary ultrasound of the right groin was performed and demonstrates a patent right common femoral vein and central greater saphenous vein. A permanent ultrasound image was recorded. Using a combination of fluoroscopy and ultrasound, an access site was determined. A small dermatotomy was made at the planned puncture site. Using ultrasound guidance, access into the right greater saphenous vein was obtained with visualization of needle entry into the vessel using a standard micropuncture technique. A wire was advanced into the IVC insert all fascial dilation performed. An 8 [place], 11 cm vascular sheath was placed into the external iliac vein. Through this access site, an 44 [place] ICE catheter was advanced with ease under fluoroscopic guidance to the level of the intrahepatic inferior vena cava. A preliminary ultrasound of the right neck was performed and demonstrates a patent internal jugular vein. A permanent ultrasound image was recorded. Using a combination of fluoroscopy and ultrasound, an access site was determined. A small dermatotomy was made at the planned puncture site. Using ultrasound guidance, access  into the right internal jugular vein was obtained with visualization of needle entry into the vessel using a standard micropuncture technique. A wire was advanced into the IVC and serial fascial dilation performed. A 10 French tips sheath was placed into the internal jugular vein and advanced to the IVC. The jugular sheath was retracted into the right atrium and manometry was performed measuring a mean pressure of 11 mmHg. A 5 French angled tip catheter was then directed into the right hepatic vein. Hepatic venogram was performed. These images demonstrated patent hepatic vein with no stenosis. The catheter was advanced to a wedge portion of the a patent vein over which the 10 French sheath was advanced into the right hepatic vein. Using ICE ultrasound visualization  the catheter as right hepatic vein as well as the portal anatomy was defined. There was acute appearing, mildly expansile heterogeneously hypoechoic thrombus throughout the portal veins. A planned exit site from the hepatic vein and puncture site from the portal vein was placed into a single sonographic plane. Under direct ultrasound visualization, the ScorpionX needle was advanced into the central right portal vein. A Glidewire Advantage was then advanced however coursed laterally in appeared to follow and extrahepatic location. Upon manipulating to preserve portal venous access, hepatic vein access was lost with the base sheath. Therefore, the jugular sheath was then retracted into the right atrium and a 5 French angled tip catheter was directed into the middle hepatic vein. Hepatic venogram was performed. These images demonstrated patent hepatic vein with no stenosis. The catheter was advanced to a wedged portion of the patent vein over which the 10 French sheath was advanced into the middle hepatic vein. Using ICE ultrasound visualization a planned exit site from the hepatic vein and portal puncture site were placed into a single sonographic plane.  Under direct ultrasound visualization, the ScorpionX needle was advanced into the central left portal vein. There is moderate redundancy in looping of the wire in the left portal vein prior to entering the main portal vein which required several manipulation techniques to reduce including balloon dilation and insertion of a stiff buddy wire. A 5 French marking pigtail catheter was then advanced over the wire into the superior mesenteric vein and wire removed. Portal venogram was performed which demonstrated occlusive thrombus throughout the central superior mesenteric vein and main portal vein. The tract was then dilated to 8 mm with an 8 mm x 8 cm Athletis balloon. A 8-10 mm by 7 + 2 cm of Viatorr endograft was placed. This was dilated to 8 mm. The indwelling 10 French sheath was then exchanged for a 16 French, 33 cm dry seal sheath. The sheath was directed to the portal aspect of the endograft. Aspiration thrombectomy was then performed in multiple passes over the wire through the main portal vein and into the central superior mesenteric vein. Moderate acute and chronic appearing thrombus were collected in the aspiration canister. A sample was sent for pathology. Repeat portal venogram demonstrated persistent occlusive appearing thrombus in the main portal vein and irregular luminal filling defects in the central superior mesenteric vein. Therefore, balloon angioplasty was performed with a 6 mm x 8 cm Athletis balloon. Repeat portal venogram demonstrated no evidence of extravasation in mildly restored luminal gain in the central superior mesenteric vein. This region was then treated with a 6 mm x 8 cm In.Pact drug coated balloon prolonged inflation for total of 3 minutes. After angioplasty, the central superior mesenteric vein appear patent with inline flow into the indwelling tips endograft. Additional balloon angioplasty through the tips endograft within 8 mm balloon was performed. Repeat portal venogram  demonstrated minimal improved patency. Additional aspiration thrombectomy was performed through the tips endograft which yielded moderate acute appearing thrombus. Repeat portal venogram demonstrated persistent near occlusive thrombus throughout the indwelling tips endograft in main portal vein. Therefore, mechanical thrombectomy was performed through the tips endograft and main portal vein into the central superior mesenteric vein with a 6 [place] cleaner device undergoing multiple passes. Repeat portal venogram demonstrated improved patency and inline flow via the indwelling tips, however with multiple irregular luminal filling defects and sluggish flow. Retrograde flow was noted into the left gastric vein which filled multiple small gastroesophageal varices. The indwelling tips endograft was  then dilated with a 10 mm x 80 mm Athletis balloon. Repeat portal venogram demonstrated improved patency and antegrade flow through the indwelling endograft. There is persistent retrograde filling of the left gastric vein. Therefore, a 5 French C2 catheter was used to select the left gastric vein. Left gastric venogram demonstrated retrograde flow with opacification and irregular vascularity about multifocal small gastroesophageal varices. Therefore, coil embolization was performed about the central left gastric vein with 2, 6 mm 0.035 "Azur detachable coils. Completion portal venogram demonstrated successfully embolized left gastric vein with antegrade flow via patent main portal vein in newly placed TIPS endograft, however several scattered filling defects remain in the main portal vein and in the endograft. The catheters and sheath were removed and manual compression was applied to the right internal jugular and right greater saphenous venous access sites until hemostasis was achieved. The patient was administered 60 mg of subcutaneous Lovenox. The patient was transferred to the PACU in stable condition. IMPRESSION: 1.  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 2. 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 3. Technically successful aspiration and mechanical thrombectomy of the superior mesenteric and main portal veins. 4. Technically successful plain and drug coated balloon angioplasty of the superior mesenteric and main portal veins. 5. Technically successful coil embolization of the left gastric vein. PLAN: Begin therapeutic Lovenox for 1 month prior to transitioning back to oral anticoagulation. IR will follow while inpatient and arrange outpatient followup within 1 month after discharge. Marliss Coots, MD [facility] Radiology Electronically Signed   By: Marliss Coots M.D.   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 EXAM: 1. Ultrasound-guided access of the right internal jugular vein 2. Ultrasound-guided access of the right greater saphenous vein 3. Hepatic venogram 4. Intravascular ultrasound 5. Catheterization of the portal vein 6. Portal venogram 7. Creation of a transhepatic portal vein to hepatic vein shunt 8. Aspiration thrombectomy of the portal vein and superior mesenteric vein 9. Mechanical thrombectomy of the portal vein and superior mesenteric vein 10. Drug coated balloon angioplasty of the main portal vein and superior mesenteric vein 11. Embolization of left gastric vein MEDICATIONS: As antibiotic prophylaxis, Rocephin 1 gm IV was ordered pre-procedure and administered intravenously within one hour of incision. ANESTHESIA/SEDATION: General - as administered by the Anesthesia department CONTRAST:  One hundred ML Omnipaque  300, intravenous FLUOROSCOPY TIME:  2,580 mGy COMPLICATIONS: None immediate. PROCEDURE: The procedure was performed in concert with my partner Dr. Katherina Right. Informed written consent was obtained from the patient after a thorough discussion of the procedural risks, benefits and alternatives. All questions were addressed. Maximal Sterile Barrier Technique was utilized including caps, mask, sterile gowns, sterile gloves, sterile drape, hand hygiene and skin antiseptic. A timeout was performed prior to the initiation of the procedure. A preliminary ultrasound of the right groin was performed and demonstrates a patent right common femoral vein and central greater saphenous vein. A permanent ultrasound image was recorded. Using a combination of fluoroscopy and ultrasound, an access site was determined. A small dermatotomy was made at the planned puncture site. Using ultrasound guidance, access into the right greater saphenous vein was obtained with visualization of needle entry into the vessel using a standard micropuncture technique. A  wire was advanced into the IVC insert all fascial dilation performed. An 8 [place], 11 cm vascular sheath was placed into the external iliac vein. Through this access site, an 42 [place] ICE catheter was advanced with ease under fluoroscopic guidance to the level of the intrahepatic inferior vena cava. A preliminary ultrasound of the right neck was performed and demonstrates a patent internal jugular vein. A permanent ultrasound image was recorded. Using a combination of fluoroscopy and ultrasound, an access site was determined. A small dermatotomy was made at the planned puncture site. Using ultrasound guidance, access into the right internal jugular vein was obtained with visualization of needle entry into the vessel using a standard micropuncture technique. A wire was advanced into the IVC and serial fascial dilation performed. A 10 French tips sheath was placed into the internal  jugular vein and advanced to the IVC. The jugular sheath was retracted into the right atrium and manometry was performed measuring a mean pressure of 11 mmHg. A 5 French angled tip catheter was then directed into the right hepatic vein. Hepatic venogram was performed. These images demonstrated patent hepatic vein with no stenosis. The catheter was advanced to a wedge portion of the a patent vein over which the 10 French sheath was advanced into the right hepatic vein. Using ICE ultrasound visualization the catheter as right hepatic vein as well as the portal anatomy was defined. There was acute appearing, mildly expansile heterogeneously hypoechoic thrombus throughout the portal veins. A planned exit site from the hepatic vein and puncture site from the portal vein was placed into a single sonographic plane. Under direct ultrasound visualization, the ScorpionX needle was advanced into the central right portal vein. A Glidewire Advantage was then advanced however coursed laterally in appeared to follow and extrahepatic location. Upon manipulating to preserve portal venous access, hepatic vein access was lost with the base sheath. Therefore, the jugular sheath was then retracted into the right atrium and a 5 French angled tip catheter was directed into the middle hepatic vein. Hepatic venogram was performed. These images demonstrated patent hepatic vein with no stenosis. The catheter was advanced to a wedged portion of the patent vein over which the 10 French sheath was advanced into the middle hepatic vein. Using ICE ultrasound visualization a planned exit site from the hepatic vein and portal puncture site were placed into a single sonographic plane. Under direct ultrasound visualization, the ScorpionX needle was advanced into the central left portal vein. There is moderate redundancy in looping of the wire in the left portal vein prior to entering the main portal vein which required several manipulation techniques  to reduce including balloon dilation and insertion of a stiff buddy wire. A 5 French marking pigtail catheter was then advanced over the wire into the superior mesenteric vein and wire removed. Portal venogram was performed which demonstrated occlusive thrombus throughout the central superior mesenteric vein and main portal vein. The tract was then dilated to 8 mm with an 8 mm x 8 cm Athletis balloon. A 8-10 mm by 7 + 2 cm of Viatorr endograft was placed. This was dilated to 8 mm. The indwelling 10 French sheath was then exchanged for a 16 French, 33 cm dry seal sheath. The sheath was directed to the portal aspect of the endograft. Aspiration thrombectomy was then performed in multiple passes over the wire through the main portal vein and into the central superior mesenteric vein. Moderate acute and chronic appearing thrombus were collected in the aspiration canister.  A sample was sent for pathology. Repeat portal venogram demonstrated persistent occlusive appearing thrombus in the main portal vein and irregular luminal filling defects in the central superior mesenteric vein. Therefore, balloon angioplasty was performed with a 6 mm x 8 cm Athletis balloon. Repeat portal venogram demonstrated no evidence of extravasation in mildly restored luminal gain in the central superior mesenteric vein. This region was then treated with a 6 mm x 8 cm In.Pact drug coated balloon prolonged inflation for total of 3 minutes. After angioplasty, the central superior mesenteric vein appear patent with inline flow into the indwelling tips endograft. Additional balloon angioplasty through the tips endograft within 8 mm balloon was performed. Repeat portal venogram demonstrated minimal improved patency. Additional aspiration thrombectomy was performed through the tips endograft which yielded moderate acute appearing thrombus. Repeat portal venogram demonstrated persistent near occlusive thrombus throughout the indwelling tips endograft  in main portal vein. Therefore, mechanical thrombectomy was performed through the tips endograft and main portal vein into the central superior mesenteric vein with a 6 [place] cleaner device undergoing multiple passes. Repeat portal venogram demonstrated improved patency and inline flow via the indwelling tips, however with multiple irregular luminal filling defects and sluggish flow. Retrograde flow was noted into the left gastric vein which filled multiple small gastroesophageal varices. The indwelling tips endograft was then dilated with a 10 mm x 80 mm Athletis balloon. Repeat portal venogram demonstrated improved patency and antegrade flow through the indwelling endograft. There is persistent retrograde filling of the left gastric vein. Therefore, a 5 French C2 catheter was used to select the left gastric vein. Left gastric venogram demonstrated retrograde flow with opacification and irregular vascularity about multifocal small gastroesophageal varices. Therefore, coil embolization was performed about the central left gastric vein with 2, 6 mm 0.035 "Azur detachable coils. Completion portal venogram demonstrated successfully embolized left gastric vein with antegrade flow via patent main portal vein in newly placed TIPS endograft, however several scattered filling defects remain in the main portal vein and in the endograft. The catheters and sheath were removed and manual compression was applied to the right internal jugular and right greater saphenous venous access sites until hemostasis was achieved. The patient was administered 60 mg of subcutaneous Lovenox. The patient was transferred to the PACU in stable condition. IMPRESSION: 1. 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 2. 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 3. Technically successful aspiration and mechanical thrombectomy  of the superior mesenteric and main portal veins. 4. Technically successful plain and drug coated balloon angioplasty of the superior mesenteric and main portal veins. 5. Technically successful coil embolization of the left gastric vein. PLAN: Begin therapeutic Lovenox for 1 month prior to transitioning back to oral anticoagulation. IR will follow while inpatient and arrange outpatient followup within 1 month after discharge. Marliss Coots, MD [facility] Radiology Electronically Signed   By: Marliss Coots M.D.   On: 02/09/2023 22:10   IR EMBO VENOUS NOT HEMORR HEMANG  INC GUIDE ROADMAPPING Result Date: 02/09/2023 CLINICAL DATA:  78 year old female with history of pancreatic cancer status post radiation with development of progressive acute portal vein thrombus and recurrent gastric hemorrhage. EXAM: 1. Ultrasound-guided access of the right internal jugular vein 2. Ultrasound-guided access of the right greater saphenous vein 3. Hepatic venogram 4. Intravascular ultrasound 5. Catheterization of the portal vein 6. Portal venogram 7. Creation of a transhepatic portal vein to hepatic vein  shunt 8. Aspiration thrombectomy of the portal vein and superior mesenteric vein 9. Mechanical thrombectomy of the portal vein and superior mesenteric vein 10. Drug coated balloon angioplasty of the main portal vein and superior mesenteric vein 11. Embolization of left gastric vein MEDICATIONS: As antibiotic prophylaxis, Rocephin 1 gm IV was ordered pre-procedure and administered intravenously within one hour of incision. ANESTHESIA/SEDATION: General - as administered by the Anesthesia department CONTRAST:  One hundred ML Omnipaque 300, intravenous FLUOROSCOPY TIME:  2,580 mGy COMPLICATIONS: None immediate. PROCEDURE: The procedure was performed in concert with my partner Dr. Katherina Right. Informed written consent was obtained from the patient after a thorough discussion of the procedural  risks, benefits and alternatives. All questions were addressed. Maximal Sterile Barrier Technique was utilized including caps, mask, sterile gowns, sterile gloves, sterile drape, hand hygiene and skin antiseptic. A timeout was performed prior to the initiation of the procedure. A preliminary ultrasound of the right groin was performed and demonstrates a patent right common femoral vein and central greater saphenous vein. A permanent ultrasound image was recorded. Using a combination of fluoroscopy and ultrasound, an access site was determined. A small dermatotomy was made at the planned puncture site. Using ultrasound guidance, access into the right greater saphenous vein was obtained with visualization of needle entry into the vessel using a standard micropuncture technique. A wire was advanced into the IVC insert all fascial dilation performed. An 8 [place], 11 cm vascular sheath was placed into the external iliac vein. Through this access site, an 23 [place] ICE catheter was advanced with ease under fluoroscopic guidance to the level of the intrahepatic inferior vena cava. A preliminary ultrasound of the right neck was performed and demonstrates a patent internal jugular vein. A permanent ultrasound image was recorded. Using a combination of fluoroscopy and ultrasound, an access site was determined. A small dermatotomy was made at the planned puncture site. Using ultrasound guidance, access into the right internal jugular vein was obtained with visualization of needle entry into the vessel using a standard micropuncture technique. A wire was advanced into the IVC and serial fascial dilation performed. A 10 French tips sheath was placed into the internal jugular vein and advanced to the IVC. The jugular sheath was retracted into the right atrium and manometry was performed measuring a mean pressure of 11 mmHg. A 5 French angled tip catheter was then directed into the right hepatic vein. Hepatic venogram was  performed. These images demonstrated patent hepatic vein with no stenosis. The catheter was advanced to a wedge portion of the a patent vein over which the 10 French sheath was advanced into the right hepatic vein. Using ICE ultrasound visualization the catheter as right hepatic vein as well as the portal anatomy was defined. There was acute appearing, mildly expansile heterogeneously hypoechoic thrombus throughout the portal veins. A planned exit site from the hepatic vein and puncture site from the portal vein was placed into a single sonographic plane. Under direct ultrasound visualization, the ScorpionX needle was advanced into the central right portal vein. A Glidewire Advantage was then advanced however coursed laterally in appeared to follow and extrahepatic location. Upon manipulating to preserve portal venous access, hepatic vein access was lost with the base sheath. Therefore, the jugular sheath was then retracted into the right atrium and a 5 French angled tip catheter was directed into the middle hepatic vein. Hepatic venogram was performed. These images demonstrated patent hepatic vein with no stenosis. The catheter was advanced to a wedged portion  of the patent vein over which the 10 French sheath was advanced into the middle hepatic vein. Using ICE ultrasound visualization a planned exit site from the hepatic vein and portal puncture site were placed into a single sonographic plane. Under direct ultrasound visualization, the ScorpionX needle was advanced into the central left portal vein. There is moderate redundancy in looping of the wire in the left portal vein prior to entering the main portal vein which required several manipulation techniques to reduce including balloon dilation and insertion of a stiff buddy wire. A 5 French marking pigtail catheter was then advanced over the wire into the superior mesenteric vein and wire removed. Portal venogram was performed which demonstrated occlusive  thrombus throughout the central superior mesenteric vein and main portal vein. The tract was then dilated to 8 mm with an 8 mm x 8 cm Athletis balloon. A 8-10 mm by 7 + 2 cm of Viatorr endograft was placed. This was dilated to 8 mm. The indwelling 10 French sheath was then exchanged for a 16 French, 33 cm dry seal sheath. The sheath was directed to the portal aspect of the endograft. Aspiration thrombectomy was then performed in multiple passes over the wire through the main portal vein and into the central superior mesenteric vein. Moderate acute and chronic appearing thrombus were collected in the aspiration canister. A sample was sent for pathology. Repeat portal venogram demonstrated persistent occlusive appearing thrombus in the main portal vein and irregular luminal filling defects in the central superior mesenteric vein. Therefore, balloon angioplasty was performed with a 6 mm x 8 cm Athletis balloon. Repeat portal venogram demonstrated no evidence of extravasation in mildly restored luminal gain in the central superior mesenteric vein. This region was then treated with a 6 mm x 8 cm In.Pact drug coated balloon prolonged inflation for total of 3 minutes. After angioplasty, the central superior mesenteric vein appear patent with inline flow into the indwelling tips endograft. Additional balloon angioplasty through the tips endograft within 8 mm balloon was performed. Repeat portal venogram demonstrated minimal improved patency. Additional aspiration thrombectomy was performed through the tips endograft which yielded moderate acute appearing thrombus. Repeat portal venogram demonstrated persistent near occlusive thrombus throughout the indwelling tips endograft in main portal vein. Therefore, mechanical thrombectomy was performed through the tips endograft and main portal vein into the central superior mesenteric vein with a 6 [place] cleaner device undergoing multiple passes. Repeat portal venogram demonstrated  improved patency and inline flow via the indwelling tips, however with multiple irregular luminal filling defects and sluggish flow. Retrograde flow was noted into the left gastric vein which filled multiple small gastroesophageal varices. The indwelling tips endograft was then dilated with a 10 mm x 80 mm Athletis balloon. Repeat portal venogram demonstrated improved patency and antegrade flow through the indwelling endograft. There is persistent retrograde filling of the left gastric vein. Therefore, a 5 French C2 catheter was used to select the left gastric vein. Left gastric venogram demonstrated retrograde flow with opacification and irregular vascularity about multifocal small gastroesophageal varices. Therefore, coil embolization was performed about the central left gastric vein with 2, 6 mm 0.035 "Azur detachable coils. Completion portal venogram demonstrated successfully embolized left gastric vein with antegrade flow via patent main portal vein in newly placed TIPS endograft, however several scattered filling defects remain in the main portal vein and in the endograft. The catheters and sheath were removed and manual compression was applied to the right internal jugular and right greater saphenous venous access  sites until hemostasis was achieved. The patient was administered 60 mg of subcutaneous Lovenox. The patient was transferred to the PACU in stable condition. IMPRESSION: 1. 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 2. 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 3. Technically successful aspiration and mechanical thrombectomy of the superior mesenteric and main portal veins. 4. Technically successful plain and drug coated balloon angioplasty of the superior mesenteric and main portal veins. 5. Technically successful coil embolization of the left gastric vein. PLAN: Begin  therapeutic Lovenox for 1 month prior to transitioning back to oral anticoagulation. IR will follow while inpatient and arrange outpatient followup within 1 month after discharge. Marliss Coots, MD [facility] Radiology Electronically Signed   By: Marliss Coots M.D.   On: 02/09/2023 22:10      Signature  -   Susa Raring M.D on 02/12/2023 at 8:20 AM   -  To page go to [email or web address]

## 2023-02-12 NOTE — Progress Notes (Addendum)
Patient is alert, awake, ambulatory with standby assistance. Patient voided  1100 mls urine out put, bladder scan . PT has R chest port. Completed CHG wipe.

## 2023-02-12 NOTE — Progress Notes (Addendum)
Mobility Specialist: Progress Note   02/12/23 1248  Mobility  Activity Ambulated with assistance in hallway  Level of Assistance Contact guard assist, steadying assist  Assistive Device None  Distance Ambulated (ft) 150 ft  Activity Response Tolerated well  Mobility Referral Yes  Mobility visit 1 Mobility  Mobility Specialist Start Time (ACUTE ONLY) 0948  Mobility Specialist Stop Time (ACUTE ONLY) 1005  Mobility Specialist Time Calculation (min) (ACUTE ONLY) 17 min   1st Session  During mobility (EOB): HR 109-110 During mobility (Ambulation): HR 111-132 Post mobility: HR 101  Pt was agreeable to mobility session - received in bed. Began to feel SOB and fatigued during ambulation, 2x LOB d/t unsteadiness. Pt was breathing heavier and requiring verbal cues to correct posture - further ambulation deferred. Returned to room without fault. Left in bed with all needs met, call bell in reach.   2nd Session MS returned for an additional session. Pt was agreeable to mobility session - received in bed. No complaints. A little unsteady but no overt LOB. Returned to room without fault. Left in bed with all needs met, call bell in reach.   Maurene Capes Mobility Specialist Please contact via SecureChat or Rehab office at [phone]

## 2023-02-13 DIAGNOSIS — K2901 Acute gastritis with bleeding: Secondary | ICD-10-CM | POA: Diagnosis not present

## 2023-02-13 LAB — BPAM RBC
Blood Product Expiration Date: 202502072359
Blood Product Expiration Date: 202502072359
Blood Product Expiration Date: 202502112359
ISSUE DATE / TIME: 202501161345
ISSUE DATE / TIME: 202501161633
ISSUE DATE / TIME: 202501181333
Unit Type and Rh: 6200
Unit Type and Rh: 6200
Unit Type and Rh: 6200

## 2023-02-13 LAB — CBC WITH DIFFERENTIAL/PLATELET
Abs Immature Granulocytes: 0.01 10*3/uL (ref 0.00–0.07)
Basophils Absolute: 0 10*3/uL (ref 0.0–0.1)
Basophils Relative: 0 %
Eosinophils Absolute: 0 10*3/uL (ref 0.0–0.5)
Eosinophils Relative: 1 %
HCT: 27.5 % — ABNORMAL LOW (ref 36.0–46.0)
Hemoglobin: 9.2 g/dL — ABNORMAL LOW (ref 12.0–15.0)
Immature Granulocytes: 0 %
Lymphocytes Relative: 18 %
Lymphs Abs: 0.5 10*3/uL — ABNORMAL LOW (ref 0.7–4.0)
MCH: 29.4 pg (ref 26.0–34.0)
MCHC: 33.5 g/dL (ref 30.0–36.0)
MCV: 87.9 fL (ref 80.0–100.0)
Monocytes Absolute: 0.4 10*3/uL (ref 0.1–1.0)
Monocytes Relative: 14 %
Neutro Abs: 1.8 10*3/uL (ref 1.7–7.7)
Neutrophils Relative %: 67 %
Platelets: 115 10*3/uL — ABNORMAL LOW (ref 150–400)
RBC: 3.13 MIL/uL — ABNORMAL LOW (ref 3.87–5.11)
RDW: 17.5 % — ABNORMAL HIGH (ref 11.5–15.5)
WBC: 2.8 10*3/uL — ABNORMAL LOW (ref 4.0–10.5)
nRBC: 0 % (ref 0.0–0.2)

## 2023-02-13 LAB — TYPE AND SCREEN
ABO/RH(D): A POS
Antibody Screen: NEGATIVE
Unit division: 0
Unit division: 0
Unit division: 0

## 2023-02-13 LAB — COMPREHENSIVE METABOLIC PANEL
ALT: 40 U/L (ref 0–44)
AST: 27 U/L (ref 15–41)
Albumin: 2.2 g/dL — ABNORMAL LOW (ref 3.5–5.0)
Alkaline Phosphatase: 77 U/L (ref 38–126)
Anion gap: 7 (ref 5–15)
BUN: 8 mg/dL (ref 8–23)
CO2: 22 mmol/L (ref 22–32)
Calcium: 8.1 mg/dL — ABNORMAL LOW (ref 8.9–10.3)
Chloride: 111 mmol/L (ref 98–111)
Creatinine, Ser: 0.95 mg/dL (ref 0.44–1.00)
GFR, Estimated: >60 mL/min (ref >60)
Glucose, Bld: 104 mg/dL — ABNORMAL HIGH (ref 70–99)
Potassium: 3.7 mmol/L (ref 3.5–5.1)
Sodium: 140 mmol/L (ref 135–145)
Total Bilirubin: 0.4 mg/dL (ref 0.0–1.2)
Total Protein: 4.7 g/dL — ABNORMAL LOW (ref 6.5–8.1)

## 2023-02-13 MED ORDER — HEPARIN SOD (PORK) LOCK FLUSH 100 UNIT/ML IV SOLN
500.0000 [IU] | INTRAVENOUS | Status: AC | PRN
  Administered 2023-02-13: 500 [IU]

## 2023-02-13 MED ORDER — ACETAMINOPHEN 325 MG PO TABS
650.0000 mg | ORAL_TABLET | Freq: Once | ORAL | Status: AC
Start: 2023-02-13 — End: 2023-02-13
  Administered 2023-02-13: 650 mg via ORAL
  Filled 2023-02-13: qty 2

## 2023-02-13 NOTE — Progress Notes (Signed)
Mobility Specialist: Progress Note   02/13/23 1210  Mobility  Activity Ambulated with assistance in hallway;Ambulated with assistance to bathroom  Level of Assistance Standby assist, set-up cues, supervision of patient - no hands on  Assistive Device None  Distance Ambulated (ft) 375 ft  Activity Response Tolerated well  Mobility Referral Yes  Mobility visit 1 Mobility  Mobility Specialist Start Time (ACUTE ONLY) 1035  Mobility Specialist Stop Time (ACUTE ONLY) 1045  Mobility Specialist Time Calculation (min) (ACUTE ONLY) 10 min    Pt was agreeable to mobility session - received in bed. Requested to use BR - stated her BM was dark and red, RN aware. Completed hallway ambulation under SV. 1x LOB but able to self correct. Returned to room without fault. Left in bed with all needs met, call bell in reach.   Maurene Capes Mobility Specialist Please contact via SecureChat or Rehab office at [phone]

## 2023-02-13 NOTE — Discharge Summary (Signed)
Sheryl Bender GMW:[id number] DOB: 1945/11/16 DOA: 02/06/2023  PCP: [name], DO  Admit date: 02/06/2023  Discharge date: 02/13/2023  Admitted From: Home   Disposition:  Home   Recommendations for Outpatient Follow-up:   Follow up with PCP in 1-2 weeks  PCP Please obtain BMP/CBC, 2 view CXR in 1week,  (see Discharge instructions)   PCP Please follow up on the following pending results: monitor CBC closely, needs close outpatient follow-up with IR, oncology and GI.  1 month of Lovenox thereafter Eliquis.   Home Health: None Equipment/Devices: None Consultations: GI, IR, Onc   Discharge Condition: Stable     CODE STATUS: Full     Diet Recommendation: Heart Healthy    Chief Complaint  Patient presents with   Abdominal Pain   Weakness     Brief history of present illness from the day of admission and additional interim summary     78 y.o. female with medical history significant of Stage 1 pancreatic cancer s/p chemoradiation, pulmonary embolism on Eliquis, iron-deficiency anemia, interstitial pancreatitis, HTN, recurrent GI bleed who presents with melena and weakness.    Patient hospitalized from 12/2-12/5 with GI bleed requiring transfusion. 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  Colonoscopy with no active bleeding, minimal amount of old blood in the cecum, 6 mm polyp removed from mid sigmoid colon.    She then resumed Eliquis on 12/11 and was re-admitted from 12/18-12/21 with recurrent GI bleed requiring transfusion. EGD found to have hemorrhagic gastritis, treated with APC, bipolar cautery.  GAVE is also possibility.  GI recommended PPI twice daily for 4 weeks then daily indefinitely. Asked to resume Eliquis on 12/28.    Hospitalized again 1/4 -1/8 for GI bleed. EGD Gastritis with hemorrhage. Treated with argon plasma coagulation (APC). Non-bleeding gastric ulcers with no stigmata of bleeding. Duodenitis. CT abdomen and pelvis with contrast-question of the nonocclusive thrombus in the SMV but most significant thrombus not seen in the portal vein including the main portal vein. low-density masslike area along the uncinate process with associated severe ductal dilatation in caliber change. Plan was for IR thrombectomy on 1/21 and to hold any systemic anticoagulation.    She now presented to the ER on 02/06/2023 for reoccurrence of GI bleed.                                                                 Hospital Course   Recurrent upper GI bleed fourth episode in the last 4 weeks, bleeding likely being caused by.  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  Seen by GI underwent EGD  on 02/07/2023 by Dr. Leonides Schanz, found bleeding GAVE in the antrum treated with APC, also several Forrest class III ulcers in gastric antrum.     She underwent TIPS procedure, aspiration of portal vein thrombus, SMV balloon angioplasty along with embolization of left gastric vein by IR on 02/09/2023.  Has been placed on full anticoagulation with Lovenox by IR postprocedure on 02/09/2023 x 1 mth then Eliquis.  Has some postprocedure pain. Seen by GI, Onc, IR again on 02/10/2023. Much improved today and symptom free.    Patient has had issues with recurrent GI bleed as above, post TIPS procedure on 02/09/2023 H&H has dropped some, some blood loss can be expected from the TIPS procedure itself, now stable H&H, no signs of fresh bleeding, eager to go home,  Her postop abdominal discomfort and pain have improved, overall she is feeling better.      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  CT abdomen shows progressive thrombus in SMV into portal vein.  Kindly see above.   Patient's pancreatic cancer was being treated at [facility] by  [name], was currently being monitored off of chemotherapy for the last 3 months, she also went to [facility] for a Whipple's procedure but it could not be done due to the location of her tumor.  Unfortunately CA 19.9 is now trending up and currently quite high, case discussed with [name] oncologist on 02/09/2023, also discussed her case with her primary oncologist [name] on 02/10/2023.   Plan is now to discharge her home with outpatient follow-up with her oncologist in 1 week, clinically she has improved.  Will have her follow-up with her oncologist for long-term goals of care discussion and plan.   Malignant neoplasm of pancreas (HCC) CA19-9 has increased from normal 4 months ago to 220 this week.  Kindly see above   Essential hypertension BP normal, diet controlled   Depression - Continue bupropion, Buspar, Lexapro and mirtazapine    Discharge diagnosis     Principal Problem:   GI bleed Active Problems:   Superior mesenteric vein thrombosis (HCC)   Depression   Essential hypertension   Malignant neoplasm of pancreas (HCC)   Acute blood loss anemia    Discharge instructions    Discharge Instructions     Discharge instructions   Complete by: As directed    Follow with Primary MD [name], DO in 3 days, also follow-up with the recommended gastroenterologist, IR physician and oncologist and recommended timeframe  Get CBC, CMP -  checked next visit with your primary MD   Activity: As tolerated with Full fall precautions use walker/cane & assistance as needed  Disposition Home    Diet: Heart Healthy   Special Instructions: If you have smoked or chewed Tobacco  in the last 2 yrs please stop smoking, stop any regular Alcohol  and or any Recreational drug use.  On your next visit  with your primary care physician please Get Medicines reviewed and adjusted.  Please request your Prim.MD to go over all Hospital Tests and Procedure/Radiological results at the follow up, please get all Hospital records sent to your Prim MD by signing hospital release before you go home.  If you experience worsening of your admission symptoms, develop shortness of breath, life threatening emergency, suicidal or homicidal thoughts you must seek medical attention immediately by calling 911 or calling your MD immediately  if symptoms less severe.  You Must read complete instructions/literature along with all  the possible adverse reactions/side effects for all the Medicines you take and that have been prescribed to you. Take any new Medicines after you have completely understood and accpet all the possible adverse reactions/side effects.   Do not drive when taking Pain medications.  Do not take more than prescribed Pain, Sleep and Anxiety Medications   Increase activity slowly   Complete by: As directed        Discharge Medications   Allergies as of 02/13/2023       Reactions   Other Hives, Other (See Comments)   Cherry wood just cut- "smelled it and broke out"; cannot tolerate ANY cherry fragrances, either   Cherry Hives   Wound Dressing Adhesive Rash, Other (See Comments)   Band-Aids = local reaction        Medication List     TAKE these medications    acetaminophen 500 MG tablet Commonly known as: TYLENOL Take 2 tablets (1,000 mg total) by mouth every 8 (eight) hours as needed for moderate pain, headache or fever. What changed: how much to take   buPROPion 300 MG 24 hr tablet Commonly known as: WELLBUTRIN XL Take 300 mg by mouth daily after breakfast. Takes differently from prescribed: 20mg  BID   busPIRone 10 MG tablet Commonly known as: BUSPAR Take 20 mg by mouth 2 (two) times daily.   enoxaparin 60 MG/0.6ML injection Commonly known as: LOVENOX Inject 0.6 mLs (60 mg  total) into the skin every 12 (twelve) hours.   escitalopram 20 MG tablet Commonly known as: LEXAPRO Take 20 mg by mouth at bedtime.   lactose free nutrition Liqd Take 237 mLs by mouth 2 (two) times daily between meals.   lidocaine-prilocaine cream Commonly known as: EMLA Apply a small amount to port a cath site (do not rub in) and cover with plastic wrap 1 hour prior to infusion appointments   Linzess 72 MCG capsule Generic drug: linaclotide TAKE 1 CAPSULE BY MOUTH DAILY BEFORE BREAKFAST What changed: See the new instructions.   mirtazapine 30 MG tablet Commonly known as: REMERON Take 15 mg by mouth at bedtime.   ondansetron 4 MG tablet Commonly known as: ZOFRAN Take 1 tablet (4 mg total) by mouth every 6 (six) hours as needed for nausea or vomiting.   pantoprazole 40 MG tablet Commonly known as: PROTONIX Take 1 tablet (40 mg total) by mouth 2 (two) times daily.   QUEtiapine 25 MG tablet Commonly known as: SEROQUEL Take 25 mg by mouth 2 (two) times daily.   sucralfate 1 g tablet Commonly known as: CARAFATE Take 1 tablet (1 g total) by mouth 4 (four) times daily -  with meals and at bedtime for 14 days.   Vitamin D3 125 MCG (5000 UT) Caps Take 5,000 Units by mouth every morning.         Follow-up Information     [facility] Follow up.   Why: Please follow up with Dr. Elby Showers in one month. A scheduler from our office will call you with a date/time of your appointment. Please call our office with any questions or concerns prior to your visit. Contact information: [address] [place] [address] [phone]         [name], DO. Schedule an appointment as soon as possible for a visit in 3 day(s).   Specialty: Family Medicine Contact information: [address] [place] [address] [phone]         Doreatha Massed, MD. Schedule an appointment  as soon as possible for a visit in 1 week(s).   Specialty:  Hematology Contact information: [address] [place] [address] [phone]         Bennie Dallas, MD. Schedule an appointment as soon as possible for a visit in 2 week(s).   Specialties: Interventional Radiology, Diagnostic Radiology, Radiology Contact information: [address] [place] [address] [phone]         Imogene Burn, MD. Schedule an appointment as soon as possible for a visit in 2 week(s).   Specialty: Gastroenterology Contact information: [address] [place] [address] [phone]                 Major procedures and Radiology Reports - PLEASE review detailed and final reports thoroughly  -      IR Tips Result Date: 02/09/2023 CLINICAL DATA:  78 year old female with history of pancreatic cancer status post radiation with development of progressive acute portal vein thrombus and recurrent gastric hemorrhage. EXAM: 1. Ultrasound-guided access of the right internal jugular vein 2. Ultrasound-guided access of the right greater saphenous vein 3. Hepatic venogram 4. Intravascular ultrasound 5. Catheterization of the portal vein 6. Portal venogram 7. Creation of a transhepatic portal vein to hepatic vein shunt 8. Aspiration thrombectomy of the portal vein and superior mesenteric vein 9. Mechanical thrombectomy of the portal vein and superior mesenteric vein 10. Drug coated balloon angioplasty of the main portal vein and superior mesenteric vein 11. Embolization of left gastric vein MEDICATIONS: As antibiotic prophylaxis, Rocephin 1 gm IV was ordered pre-procedure and administered intravenously within one hour of incision. ANESTHESIA/SEDATION: General - as administered by the Anesthesia department CONTRAST:  One hundred ML Omnipaque 300, intravenous FLUOROSCOPY TIME:  2,580 mGy COMPLICATIONS: None immediate. PROCEDURE: The procedure was performed in concert with my partner Dr. Katherina Right. Informed written consent was obtained from  the patient after a thorough discussion of the procedural risks, benefits and alternatives. All questions were addressed. Maximal Sterile Barrier Technique was utilized including caps, mask, sterile gowns, sterile gloves, sterile drape, hand hygiene and skin antiseptic. A timeout was performed prior to the initiation of the procedure. A preliminary ultrasound of the right groin was performed and demonstrates a patent right common femoral vein and central greater saphenous vein. A permanent ultrasound image was recorded. Using a combination of fluoroscopy and ultrasound, an access site was determined. A small dermatotomy was made at the planned puncture site. Using ultrasound guidance, access into the right greater saphenous vein was obtained with visualization of needle entry into the vessel using a standard micropuncture technique. A wire was advanced into the IVC insert all fascial dilation performed. An 8 [place], 11 cm vascular sheath was placed into the external iliac vein. Through this access site, an 71 [place] ICE catheter was advanced with ease under fluoroscopic guidance to the level of the intrahepatic inferior vena cava. A preliminary ultrasound of the right neck was performed and demonstrates a patent internal jugular vein. A permanent ultrasound image was recorded. Using a combination of fluoroscopy and ultrasound, an access site was determined. A small dermatotomy was made at the planned puncture site. Using ultrasound guidance, access into the right internal jugular vein was obtained with visualization of needle entry into the vessel using a standard micropuncture technique. A wire was advanced into the IVC and serial fascial dilation performed. A 10 French tips sheath was placed into the internal jugular vein and advanced to the  IVC. The jugular sheath was retracted into the right atrium and manometry was performed measuring a mean pressure of 11 mmHg. A 5 French angled tip catheter was then  directed into the right hepatic vein. Hepatic venogram was performed. These images demonstrated patent hepatic vein with no stenosis. The catheter was advanced to a wedge portion of the a patent vein over which the 10 French sheath was advanced into the right hepatic vein. Using ICE ultrasound visualization the catheter as right hepatic vein as well as the portal anatomy was defined. There was acute appearing, mildly expansile heterogeneously hypoechoic thrombus throughout the portal veins. A planned exit site from the hepatic vein and puncture site from the portal vein was placed into a single sonographic plane. Under direct ultrasound visualization, the ScorpionX needle was advanced into the central right portal vein. A Glidewire Advantage was then advanced however coursed laterally in appeared to follow and extrahepatic location. Upon manipulating to preserve portal venous access, hepatic vein access was lost with the base sheath. Therefore, the jugular sheath was then retracted into the right atrium and a 5 French angled tip catheter was directed into the middle hepatic vein. Hepatic venogram was performed. These images demonstrated patent hepatic vein with no stenosis. The catheter was advanced to a wedged portion of the patent vein over which the 10 French sheath was advanced into the middle hepatic vein. Using ICE ultrasound visualization a planned exit site from the hepatic vein and portal puncture site were placed into a single sonographic plane. Under direct ultrasound visualization, the ScorpionX needle was advanced into the central left portal vein. There is moderate redundancy in looping of the wire in the left portal vein prior to entering the main portal vein which required several manipulation techniques to reduce including balloon dilation and insertion of a stiff buddy wire. A 5 French marking pigtail catheter was then advanced over the wire into the superior mesenteric vein and wire removed.  Portal venogram was performed which demonstrated occlusive thrombus throughout the central superior mesenteric vein and main portal vein. The tract was then dilated to 8 mm with an 8 mm x 8 cm Athletis balloon. A 8-10 mm by 7 + 2 cm of Viatorr endograft was placed. This was dilated to 8 mm. The indwelling 10 French sheath was then exchanged for a 16 French, 33 cm dry seal sheath. The sheath was directed to the portal aspect of the endograft. Aspiration thrombectomy was then performed in multiple passes over the wire through the main portal vein and into the central superior mesenteric vein. Moderate acute and chronic appearing thrombus were collected in the aspiration canister. A sample was sent for pathology. Repeat portal venogram demonstrated persistent occlusive appearing thrombus in the main portal vein and irregular luminal filling defects in the central superior mesenteric vein. Therefore, balloon angioplasty was performed with a 6 mm x 8 cm Athletis balloon. Repeat portal venogram demonstrated no evidence of extravasation in mildly restored luminal gain in the central superior mesenteric vein. This region was then treated with a 6 mm x 8 cm In.Pact drug coated balloon prolonged inflation for total of 3 minutes. After angioplasty, the central superior mesenteric vein appear patent with inline flow into the indwelling tips endograft. Additional balloon angioplasty through the tips endograft within 8 mm balloon was performed. Repeat portal venogram demonstrated minimal improved patency. Additional aspiration thrombectomy was performed through the tips endograft which yielded moderate acute appearing thrombus. Repeat portal venogram demonstrated persistent near occlusive thrombus throughout the  indwelling tips endograft in main portal vein. Therefore, mechanical thrombectomy was performed through the tips endograft and main portal vein into the central superior mesenteric vein with a 6 [place] cleaner device  undergoing multiple passes. Repeat portal venogram demonstrated improved patency and inline flow via the indwelling tips, however with multiple irregular luminal filling defects and sluggish flow. Retrograde flow was noted into the left gastric vein which filled multiple small gastroesophageal varices. The indwelling tips endograft was then dilated with a 10 mm x 80 mm Athletis balloon. Repeat portal venogram demonstrated improved patency and antegrade flow through the indwelling endograft. There is persistent retrograde filling of the left gastric vein. Therefore, a 5 French C2 catheter was used to select the left gastric vein. Left gastric venogram demonstrated retrograde flow with opacification and irregular vascularity about multifocal small gastroesophageal varices. Therefore, coil embolization was performed about the central left gastric vein with 2, 6 mm 0.035 "Azur detachable coils. Completion portal venogram demonstrated successfully embolized left gastric vein with antegrade flow via patent main portal vein in newly placed TIPS endograft, however several scattered filling defects remain in the main portal vein and in the endograft. The catheters and sheath were removed and manual compression was applied to the right internal jugular and right greater saphenous venous access sites until hemostasis was achieved. The patient was administered 60 mg of subcutaneous Lovenox. The patient was transferred to the PACU in stable condition. IMPRESSION: 1. 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 2. 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 3. Technically successful aspiration and mechanical thrombectomy of the superior mesenteric and main portal veins. 4. Technically successful plain and drug coated balloon angioplasty of the superior mesenteric and main portal veins. 5. Technically  successful coil embolization of the left gastric vein. PLAN: Begin therapeutic Lovenox for 1 month prior to transitioning back to oral anticoagulation. IR will follow while inpatient and arrange outpatient followup within 1 month after discharge. Marliss Coots, MD [facility] Radiology Electronically Signed   By: Marliss Coots M.D.   On: 02/09/2023 22:10   IR US Guide Vasc Access Right Result Date: 02/09/2023 CLINICAL DATA:  78 year old female with history of pancreatic cancer status post radiation with development of progressive acute portal vein thrombus and recurrent gastric hemorrhage. EXAM: 1. Ultrasound-guided access of the right internal jugular vein 2. Ultrasound-guided access of the right greater saphenous vein 3. Hepatic venogram 4. Intravascular ultrasound 5. Catheterization of the portal vein 6. Portal venogram 7. Creation of a transhepatic portal vein to hepatic vein shunt 8. Aspiration thrombectomy of the portal vein and superior mesenteric vein 9. Mechanical thrombectomy of the portal vein and superior mesenteric vein 10. Drug coated balloon angioplasty of the main portal vein and superior mesenteric vein 11. Embolization of left gastric vein MEDICATIONS: As antibiotic prophylaxis, Rocephin 1 gm IV was ordered pre-procedure and administered intravenously within one hour of incision. ANESTHESIA/SEDATION: General - as administered by the Anesthesia department CONTRAST:  One hundred ML Omnipaque 300, intravenous FLUOROSCOPY TIME:  2,580 mGy COMPLICATIONS: None immediate. PROCEDURE: The procedure was performed in concert with my partner Dr. Katherina Right. Informed written consent was obtained from the patient after a thorough discussion of the procedural risks, benefits and alternatives. All questions were addressed. Maximal Sterile Barrier Technique was utilized including caps, mask, sterile gowns, sterile gloves, sterile drape, hand hygiene and skin  antiseptic. A timeout was performed prior to the initiation  of the procedure. A preliminary ultrasound of the right groin was performed and demonstrates a patent right common femoral vein and central greater saphenous vein. A permanent ultrasound image was recorded. Using a combination of fluoroscopy and ultrasound, an access site was determined. A small dermatotomy was made at the planned puncture site. Using ultrasound guidance, access into the right greater saphenous vein was obtained with visualization of needle entry into the vessel using a standard micropuncture technique. A wire was advanced into the IVC insert all fascial dilation performed. An 8 [place], 11 cm vascular sheath was placed into the external iliac vein. Through this access site, an 26 [place] ICE catheter was advanced with ease under fluoroscopic guidance to the level of the intrahepatic inferior vena cava. A preliminary ultrasound of the right neck was performed and demonstrates a patent internal jugular vein. A permanent ultrasound image was recorded. Using a combination of fluoroscopy and ultrasound, an access site was determined. A small dermatotomy was made at the planned puncture site. Using ultrasound guidance, access into the right internal jugular vein was obtained with visualization of needle entry into the vessel using a standard micropuncture technique. A wire was advanced into the IVC and serial fascial dilation performed. A 10 French tips sheath was placed into the internal jugular vein and advanced to the IVC. The jugular sheath was retracted into the right atrium and manometry was performed measuring a mean pressure of 11 mmHg. A 5 French angled tip catheter was then directed into the right hepatic vein. Hepatic venogram was performed. These images demonstrated patent hepatic vein with no stenosis. The catheter was advanced to a wedge portion of the a patent vein over which the 10 French sheath was advanced into the right  hepatic vein. Using ICE ultrasound visualization the catheter as right hepatic vein as well as the portal anatomy was defined. There was acute appearing, mildly expansile heterogeneously hypoechoic thrombus throughout the portal veins. A planned exit site from the hepatic vein and puncture site from the portal vein was placed into a single sonographic plane. Under direct ultrasound visualization, the ScorpionX needle was advanced into the central right portal vein. A Glidewire Advantage was then advanced however coursed laterally in appeared to follow and extrahepatic location. Upon manipulating to preserve portal venous access, hepatic vein access was lost with the base sheath. Therefore, the jugular sheath was then retracted into the right atrium and a 5 French angled tip catheter was directed into the middle hepatic vein. Hepatic venogram was performed. These images demonstrated patent hepatic vein with no stenosis. The catheter was advanced to a wedged portion of the patent vein over which the 10 French sheath was advanced into the middle hepatic vein. Using ICE ultrasound visualization a planned exit site from the hepatic vein and portal puncture site were placed into a single sonographic plane. Under direct ultrasound visualization, the ScorpionX needle was advanced into the central left portal vein. There is moderate redundancy in looping of the wire in the left portal vein prior to entering the main portal vein which required several manipulation techniques to reduce including balloon dilation and insertion of a stiff buddy wire. A 5 French marking pigtail catheter was then advanced over the wire into the superior mesenteric vein and wire removed. Portal venogram was performed which demonstrated occlusive thrombus throughout the central superior mesenteric vein and main portal vein. The tract was then dilated to 8 mm with an 8 mm x 8 cm Athletis balloon. A 8-10 mm by  7 + 2 cm of Viatorr endograft was placed.  This was dilated to 8 mm. The indwelling 10 French sheath was then exchanged for a 16 French, 33 cm dry seal sheath. The sheath was directed to the portal aspect of the endograft. Aspiration thrombectomy was then performed in multiple passes over the wire through the main portal vein and into the central superior mesenteric vein. Moderate acute and chronic appearing thrombus were collected in the aspiration canister. A sample was sent for pathology. Repeat portal venogram demonstrated persistent occlusive appearing thrombus in the main portal vein and irregular luminal filling defects in the central superior mesenteric vein. Therefore, balloon angioplasty was performed with a 6 mm x 8 cm Athletis balloon. Repeat portal venogram demonstrated no evidence of extravasation in mildly restored luminal gain in the central superior mesenteric vein. This region was then treated with a 6 mm x 8 cm In.Pact drug coated balloon prolonged inflation for total of 3 minutes. After angioplasty, the central superior mesenteric vein appear patent with inline flow into the indwelling tips endograft. Additional balloon angioplasty through the tips endograft within 8 mm balloon was performed. Repeat portal venogram demonstrated minimal improved patency. Additional aspiration thrombectomy was performed through the tips endograft which yielded moderate acute appearing thrombus. Repeat portal venogram demonstrated persistent near occlusive thrombus throughout the indwelling tips endograft in main portal vein. Therefore, mechanical thrombectomy was performed through the tips endograft and main portal vein into the central superior mesenteric vein with a 6 [place] cleaner device undergoing multiple passes. Repeat portal venogram demonstrated improved patency and inline flow via the indwelling tips, however with multiple irregular luminal filling defects and sluggish flow. Retrograde flow was noted into the left gastric vein which filled  multiple small gastroesophageal varices. The indwelling tips endograft was then dilated with a 10 mm x 80 mm Athletis balloon. Repeat portal venogram demonstrated improved patency and antegrade flow through the indwelling endograft. There is persistent retrograde filling of the left gastric vein. Therefore, a 5 French C2 catheter was used to select the left gastric vein. Left gastric venogram demonstrated retrograde flow with opacification and irregular vascularity about multifocal small gastroesophageal varices. Therefore, coil embolization was performed about the central left gastric vein with 2, 6 mm 0.035 "Azur detachable coils. Completion portal venogram demonstrated successfully embolized left gastric vein with antegrade flow via patent main portal vein in newly placed TIPS endograft, however several scattered filling defects remain in the main portal vein and in the endograft. The catheters and sheath were removed and manual compression was applied to the right internal jugular and right greater saphenous venous access sites until hemostasis was achieved. The patient was administered 60 mg of subcutaneous Lovenox. The patient was transferred to the PACU in stable condition. IMPRESSION: 1. 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 2. 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 3. Technically successful aspiration and mechanical thrombectomy of the superior mesenteric and main portal veins. 4. Technically successful plain and drug coated balloon angioplasty of the superior mesenteric and main portal veins. 5. Technically successful coil embolization of the left gastric vein. PLAN: Begin therapeutic Lovenox for 1 month prior to transitioning back to oral anticoagulation. IR will follow while inpatient and arrange outpatient followup within 1 month after discharge. Marliss Coots, MD  [facility] Radiology Electronically Signed   By: Marliss Coots M.D.   On: 02/09/2023 22:10   IR US Guide  Vasc Access Right Result Date: 02/09/2023 CLINICAL DATA:  78 year old female with history of pancreatic cancer status post radiation with development of progressive acute portal vein thrombus and recurrent gastric hemorrhage. EXAM: 1. Ultrasound-guided access of the right internal jugular vein 2. Ultrasound-guided access of the right greater saphenous vein 3. Hepatic venogram 4. Intravascular ultrasound 5. Catheterization of the portal vein 6. Portal venogram 7. Creation of a transhepatic portal vein to hepatic vein shunt 8. Aspiration thrombectomy of the portal vein and superior mesenteric vein 9. Mechanical thrombectomy of the portal vein and superior mesenteric vein 10. Drug coated balloon angioplasty of the main portal vein and superior mesenteric vein 11. Embolization of left gastric vein MEDICATIONS: As antibiotic prophylaxis, Rocephin 1 gm IV was ordered pre-procedure and administered intravenously within one hour of incision. ANESTHESIA/SEDATION: General - as administered by the Anesthesia department CONTRAST:  One hundred ML Omnipaque 300, intravenous FLUOROSCOPY TIME:  2,580 mGy COMPLICATIONS: None immediate. PROCEDURE: The procedure was performed in concert with my partner Dr. Katherina Right. Informed written consent was obtained from the patient after a thorough discussion of the procedural risks, benefits and alternatives. All questions were addressed. Maximal Sterile Barrier Technique was utilized including caps, mask, sterile gowns, sterile gloves, sterile drape, hand hygiene and skin antiseptic. A timeout was performed prior to the initiation of the procedure. A preliminary ultrasound of the right groin was performed and demonstrates a patent right common femoral vein and central greater saphenous vein. A permanent ultrasound image was recorded.  Using a combination of fluoroscopy and ultrasound, an access site was determined. A small dermatotomy was made at the planned puncture site. Using ultrasound guidance, access into the right greater saphenous vein was obtained with visualization of needle entry into the vessel using a standard micropuncture technique. A wire was advanced into the IVC insert all fascial dilation performed. An 8 [place], 11 cm vascular sheath was placed into the external iliac vein. Through this access site, an 66 [place] ICE catheter was advanced with ease under fluoroscopic guidance to the level of the intrahepatic inferior vena cava. A preliminary ultrasound of the right neck was performed and demonstrates a patent internal jugular vein. A permanent ultrasound image was recorded. Using a combination of fluoroscopy and ultrasound, an access site was determined. A small dermatotomy was made at the planned puncture site. Using ultrasound guidance, access into the right internal jugular vein was obtained with visualization of needle entry into the vessel using a standard micropuncture technique. A wire was advanced into the IVC and serial fascial dilation performed. A 10 French tips sheath was placed into the internal jugular vein and advanced to the IVC. The jugular sheath was retracted into the right atrium and manometry was performed measuring a mean pressure of 11 mmHg. A 5 French angled tip catheter was then directed into the right hepatic vein. Hepatic venogram was performed. These images demonstrated patent hepatic vein with no stenosis. The catheter was advanced to a wedge portion of the a patent vein over which the 10 French sheath was advanced into the right hepatic vein. Using ICE ultrasound visualization the catheter as right hepatic vein as well as the portal anatomy was defined. There was acute appearing, mildly expansile heterogeneously hypoechoic thrombus throughout the portal veins. A planned exit site from the  hepatic vein and puncture site from the portal vein was placed into a single sonographic plane. Under direct ultrasound visualization, the ScorpionX needle was advanced into the central right portal vein. A Glidewire Advantage  was then advanced however coursed laterally in appeared to follow and extrahepatic location. Upon manipulating to preserve portal venous access, hepatic vein access was lost with the base sheath. Therefore, the jugular sheath was then retracted into the right atrium and a 5 French angled tip catheter was directed into the middle hepatic vein. Hepatic venogram was performed. These images demonstrated patent hepatic vein with no stenosis. The catheter was advanced to a wedged portion of the patent vein over which the 10 French sheath was advanced into the middle hepatic vein. Using ICE ultrasound visualization a planned exit site from the hepatic vein and portal puncture site were placed into a single sonographic plane. Under direct ultrasound visualization, the ScorpionX needle was advanced into the central left portal vein. There is moderate redundancy in looping of the wire in the left portal vein prior to entering the main portal vein which required several manipulation techniques to reduce including balloon dilation and insertion of a stiff buddy wire. A 5 French marking pigtail catheter was then advanced over the wire into the superior mesenteric vein and wire removed. Portal venogram was performed which demonstrated occlusive thrombus throughout the central superior mesenteric vein and main portal vein. The tract was then dilated to 8 mm with an 8 mm x 8 cm Athletis balloon. A 8-10 mm by 7 + 2 cm of Viatorr endograft was placed. This was dilated to 8 mm. The indwelling 10 French sheath was then exchanged for a 16 French, 33 cm dry seal sheath. The sheath was directed to the portal aspect of the endograft. Aspiration thrombectomy was then performed in multiple passes over the wire through  the main portal vein and into the central superior mesenteric vein. Moderate acute and chronic appearing thrombus were collected in the aspiration canister. A sample was sent for pathology. Repeat portal venogram demonstrated persistent occlusive appearing thrombus in the main portal vein and irregular luminal filling defects in the central superior mesenteric vein. Therefore, balloon angioplasty was performed with a 6 mm x 8 cm Athletis balloon. Repeat portal venogram demonstrated no evidence of extravasation in mildly restored luminal gain in the central superior mesenteric vein. This region was then treated with a 6 mm x 8 cm In.Pact drug coated balloon prolonged inflation for total of 3 minutes. After angioplasty, the central superior mesenteric vein appear patent with inline flow into the indwelling tips endograft. Additional balloon angioplasty through the tips endograft within 8 mm balloon was performed. Repeat portal venogram demonstrated minimal improved patency. Additional aspiration thrombectomy was performed through the tips endograft which yielded moderate acute appearing thrombus. Repeat portal venogram demonstrated persistent near occlusive thrombus throughout the indwelling tips endograft in main portal vein. Therefore, mechanical thrombectomy was performed through the tips endograft and main portal vein into the central superior mesenteric vein with a 6 [place] cleaner device undergoing multiple passes. Repeat portal venogram demonstrated improved patency and inline flow via the indwelling tips, however with multiple irregular luminal filling defects and sluggish flow. Retrograde flow was noted into the left gastric vein which filled multiple small gastroesophageal varices. The indwelling tips endograft was then dilated with a 10 mm x 80 mm Athletis balloon. Repeat portal venogram demonstrated improved patency and antegrade flow through the indwelling endograft. There is persistent retrograde filling  of the left gastric vein. Therefore, a 5 French C2 catheter was used to select the left gastric vein. Left gastric venogram demonstrated retrograde flow with opacification and irregular vascularity about multifocal small gastroesophageal varices. Therefore, coil embolization  was performed about the central left gastric vein with 2, 6 mm 0.035 "Azur detachable coils. Completion portal venogram demonstrated successfully embolized left gastric vein with antegrade flow via patent main portal vein in newly placed TIPS endograft, however several scattered filling defects remain in the main portal vein and in the endograft. The catheters and sheath were removed and manual compression was applied to the right internal jugular and right greater saphenous venous access sites until hemostasis was achieved. The patient was administered 60 mg of subcutaneous Lovenox. The patient was transferred to the PACU in stable condition. IMPRESSION: 1. 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 2. 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 3. Technically successful aspiration and mechanical thrombectomy of the superior mesenteric and main portal veins. 4. Technically successful plain and drug coated balloon angioplasty of the superior mesenteric and main portal veins. 5. Technically successful coil embolization of the left gastric vein. PLAN: Begin therapeutic Lovenox for 1 month prior to transitioning back to oral anticoagulation. IR will follow while inpatient and arrange outpatient followup within 1 month after discharge. Marliss Coots, MD [facility] Radiology Electronically Signed   By: Marliss Coots M.D.   On: 02/09/2023 22:10   IR THROMBECT VENO MECH MOD SED Result Date: 02/09/2023 CLINICAL DATA:  78 year old female with history of pancreatic cancer status  post radiation with development of progressive acute portal vein thrombus and recurrent gastric hemorrhage. EXAM: 1. Ultrasound-guided access of the right internal jugular vein 2. Ultrasound-guided access of the right greater saphenous vein 3. Hepatic venogram 4. Intravascular ultrasound 5. Catheterization of the portal vein 6. Portal venogram 7. Creation of a transhepatic portal vein to hepatic vein shunt 8. Aspiration thrombectomy of the portal vein and superior mesenteric vein 9. Mechanical thrombectomy of the portal vein and superior mesenteric vein 10. Drug coated balloon angioplasty of the main portal vein and superior mesenteric vein 11. Embolization of left gastric vein MEDICATIONS: As antibiotic prophylaxis, Rocephin 1 gm IV was ordered pre-procedure and administered intravenously within one hour of incision. ANESTHESIA/SEDATION: General - as administered by the Anesthesia department CONTRAST:  One hundred ML Omnipaque 300, intravenous FLUOROSCOPY TIME:  2,580 mGy COMPLICATIONS: None immediate. PROCEDURE: The procedure was performed in concert with my partner Dr. Katherina Right. Informed written consent was obtained from the patient after a thorough discussion of the procedural risks, benefits and alternatives. All questions were addressed. Maximal Sterile Barrier Technique was utilized including caps, mask, sterile gowns, sterile gloves, sterile drape, hand hygiene and skin antiseptic. A timeout was performed prior to the initiation of the procedure. A preliminary ultrasound of the right groin was performed and demonstrates a patent right common femoral vein and central greater saphenous vein. A permanent ultrasound image was recorded. Using a combination of fluoroscopy and ultrasound, an access site was determined. A small dermatotomy was made at the planned puncture site. Using ultrasound guidance, access into the right greater saphenous vein was obtained with visualization of needle entry into the vessel  using a standard micropuncture technique. A wire was advanced into the IVC insert all fascial dilation performed. An 8 [place], 11 cm vascular sheath was placed into the external iliac vein. Through this access site, an 67 [place] ICE catheter was advanced with ease under fluoroscopic guidance to the level of the intrahepatic inferior vena cava. A preliminary ultrasound of the right neck was performed and demonstrates a patent internal jugular vein.  A permanent ultrasound image was recorded. Using a combination of fluoroscopy and ultrasound, an access site was determined. A small dermatotomy was made at the planned puncture site. Using ultrasound guidance, access into the right internal jugular vein was obtained with visualization of needle entry into the vessel using a standard micropuncture technique. A wire was advanced into the IVC and serial fascial dilation performed. A 10 French tips sheath was placed into the internal jugular vein and advanced to the IVC. The jugular sheath was retracted into the right atrium and manometry was performed measuring a mean pressure of 11 mmHg. A 5 French angled tip catheter was then directed into the right hepatic vein. Hepatic venogram was performed. These images demonstrated patent hepatic vein with no stenosis. The catheter was advanced to a wedge portion of the a patent vein over which the 10 French sheath was advanced into the right hepatic vein. Using ICE ultrasound visualization the catheter as right hepatic vein as well as the portal anatomy was defined. There was acute appearing, mildly expansile heterogeneously hypoechoic thrombus throughout the portal veins. A planned exit site from the hepatic vein and puncture site from the portal vein was placed into a single sonographic plane. Under direct ultrasound visualization, the ScorpionX needle was advanced into the central right portal vein. A Glidewire Advantage was then advanced however coursed laterally in  appeared to follow and extrahepatic location. Upon manipulating to preserve portal venous access, hepatic vein access was lost with the base sheath. Therefore, the jugular sheath was then retracted into the right atrium and a 5 French angled tip catheter was directed into the middle hepatic vein. Hepatic venogram was performed. These images demonstrated patent hepatic vein with no stenosis. The catheter was advanced to a wedged portion of the patent vein over which the 10 French sheath was advanced into the middle hepatic vein. Using ICE ultrasound visualization a planned exit site from the hepatic vein and portal puncture site were placed into a single sonographic plane. Under direct ultrasound visualization, the ScorpionX needle was advanced into the central left portal vein. There is moderate redundancy in looping of the wire in the left portal vein prior to entering the main portal vein which required several manipulation techniques to reduce including balloon dilation and insertion of a stiff buddy wire. A 5 French marking pigtail catheter was then advanced over the wire into the superior mesenteric vein and wire removed. Portal venogram was performed which demonstrated occlusive thrombus throughout the central superior mesenteric vein and main portal vein. The tract was then dilated to 8 mm with an 8 mm x 8 cm Athletis balloon. A 8-10 mm by 7 + 2 cm of Viatorr endograft was placed. This was dilated to 8 mm. The indwelling 10 French sheath was then exchanged for a 16 French, 33 cm dry seal sheath. The sheath was directed to the portal aspect of the endograft. Aspiration thrombectomy was then performed in multiple passes over the wire through the main portal vein and into the central superior mesenteric vein. Moderate acute and chronic appearing thrombus were collected in the aspiration canister. A sample was sent for pathology. Repeat portal venogram demonstrated persistent occlusive appearing thrombus in the  main portal vein and irregular luminal filling defects in the central superior mesenteric vein. Therefore, balloon angioplasty was performed with a 6 mm x 8 cm Athletis balloon. Repeat portal venogram demonstrated no evidence of extravasation in mildly restored luminal gain in the central superior mesenteric vein. This region was then  treated with a 6 mm x 8 cm In.Pact drug coated balloon prolonged inflation for total of 3 minutes. After angioplasty, the central superior mesenteric vein appear patent with inline flow into the indwelling tips endograft. Additional balloon angioplasty through the tips endograft within 8 mm balloon was performed. Repeat portal venogram demonstrated minimal improved patency. Additional aspiration thrombectomy was performed through the tips endograft which yielded moderate acute appearing thrombus. Repeat portal venogram demonstrated persistent near occlusive thrombus throughout the indwelling tips endograft in main portal vein. Therefore, mechanical thrombectomy was performed through the tips endograft and main portal vein into the central superior mesenteric vein with a 6 [place] cleaner device undergoing multiple passes. Repeat portal venogram demonstrated improved patency and inline flow via the indwelling tips, however with multiple irregular luminal filling defects and sluggish flow. Retrograde flow was noted into the left gastric vein which filled multiple small gastroesophageal varices. The indwelling tips endograft was then dilated with a 10 mm x 80 mm Athletis balloon. Repeat portal venogram demonstrated improved patency and antegrade flow through the indwelling endograft. There is persistent retrograde filling of the left gastric vein. Therefore, a 5 French C2 catheter was used to select the left gastric vein. Left gastric venogram demonstrated retrograde flow with opacification and irregular vascularity about multifocal small gastroesophageal varices. Therefore, coil  embolization was performed about the central left gastric vein with 2, 6 mm 0.035 "Azur detachable coils. Completion portal venogram demonstrated successfully embolized left gastric vein with antegrade flow via patent main portal vein in newly placed TIPS endograft, however several scattered filling defects remain in the main portal vein and in the endograft. The catheters and sheath were removed and manual compression was applied to the right internal jugular and right greater saphenous venous access sites until hemostasis was achieved. The patient was administered 60 mg of subcutaneous Lovenox. The patient was transferred to the PACU in stable condition. IMPRESSION: 1. 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 2. 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 3. Technically successful aspiration and mechanical thrombectomy of the superior mesenteric and main portal veins. 4. Technically successful plain and drug coated balloon angioplasty of the superior mesenteric and main portal veins. 5. Technically successful coil embolization of the left gastric vein. PLAN: Begin therapeutic Lovenox for 1 month prior to transitioning back to oral anticoagulation. IR will follow while inpatient and arrange outpatient followup within 1 month after discharge. Marliss Coots, MD [facility] Radiology Electronically Signed   By: Marliss Coots M.D.   On: 02/09/2023 22:10   IR EMBO VENOUS NOT HEMORR HEMANG  INC GUIDE ROADMAPPING Result Date: 02/09/2023 CLINICAL DATA:  78 year old female with history of pancreatic cancer status post radiation with development of progressive acute portal vein thrombus and recurrent gastric hemorrhage. EXAM: 1. Ultrasound-guided access of the right internal jugular vein 2. Ultrasound-guided access of the right greater saphenous vein  3. Hepatic venogram 4. Intravascular ultrasound 5. Catheterization of the portal vein 6. Portal venogram 7. Creation of a transhepatic portal vein to hepatic vein shunt 8. Aspiration thrombectomy of the portal vein and superior mesenteric vein 9. Mechanical thrombectomy of the portal vein and superior mesenteric vein 10. Drug coated balloon angioplasty of the main portal vein and superior mesenteric vein 11. Embolization of left gastric vein MEDICATIONS: As antibiotic prophylaxis, Rocephin 1 gm IV was ordered pre-procedure and administered intravenously within one hour of incision. ANESTHESIA/SEDATION: General - as administered  by the Anesthesia department CONTRAST:  One hundred ML Omnipaque 300, intravenous FLUOROSCOPY TIME:  2,580 mGy COMPLICATIONS: None immediate. PROCEDURE: The procedure was performed in concert with my partner Dr. Katherina Right. Informed written consent was obtained from the patient after a thorough discussion of the procedural risks, benefits and alternatives. All questions were addressed. Maximal Sterile Barrier Technique was utilized including caps, mask, sterile gowns, sterile gloves, sterile drape, hand hygiene and skin antiseptic. A timeout was performed prior to the initiation of the procedure. A preliminary ultrasound of the right groin was performed and demonstrates a patent right common femoral vein and central greater saphenous vein. A permanent ultrasound image was recorded. Using a combination of fluoroscopy and ultrasound, an access site was determined. A small dermatotomy was made at the planned puncture site. Using ultrasound guidance, access into the right greater saphenous vein was obtained with visualization of needle entry into the vessel using a standard micropuncture technique. A wire was advanced into the IVC insert all fascial dilation performed. An 8 [place], 11 cm vascular sheath was placed into the external iliac vein. Through this access site, an 27 [place] ICE  catheter was advanced with ease under fluoroscopic guidance to the level of the intrahepatic inferior vena cava. A preliminary ultrasound of the right neck was performed and demonstrates a patent internal jugular vein. A permanent ultrasound image was recorded. Using a combination of fluoroscopy and ultrasound, an access site was determined. A small dermatotomy was made at the planned puncture site. Using ultrasound guidance, access into the right internal jugular vein was obtained with visualization of needle entry into the vessel using a standard micropuncture technique. A wire was advanced into the IVC and serial fascial dilation performed. A 10 French tips sheath was placed into the internal jugular vein and advanced to the IVC. The jugular sheath was retracted into the right atrium and manometry was performed measuring a mean pressure of 11 mmHg. A 5 French angled tip catheter was then directed into the right hepatic vein. Hepatic venogram was performed. These images demonstrated patent hepatic vein with no stenosis. The catheter was advanced to a wedge portion of the a patent vein over which the 10 French sheath was advanced into the right hepatic vein. Using ICE ultrasound visualization the catheter as right hepatic vein as well as the portal anatomy was defined. There was acute appearing, mildly expansile heterogeneously hypoechoic thrombus throughout the portal veins. A planned exit site from the hepatic vein and puncture site from the portal vein was placed into a single sonographic plane. Under direct ultrasound visualization, the ScorpionX needle was advanced into the central right portal vein. A Glidewire Advantage was then advanced however coursed laterally in appeared to follow and extrahepatic location. Upon manipulating to preserve portal venous access, hepatic vein access was lost with the base sheath. Therefore, the jugular sheath was then retracted into the right atrium and a 5 French angled tip  catheter was directed into the middle hepatic vein. Hepatic venogram was performed. These images demonstrated patent hepatic vein with no stenosis. The catheter was advanced to a wedged portion of the patent vein over which the 10 French sheath was advanced into the middle hepatic vein. Using ICE ultrasound visualization a planned exit site from the hepatic vein and portal puncture site were placed into a single sonographic plane. Under direct ultrasound visualization, the ScorpionX needle was advanced into the central left portal vein. There is moderate redundancy in looping of the wire in the left  portal vein prior to entering the main portal vein which required several manipulation techniques to reduce including balloon dilation and insertion of a stiff buddy wire. A 5 French marking pigtail catheter was then advanced over the wire into the superior mesenteric vein and wire removed. Portal venogram was performed which demonstrated occlusive thrombus throughout the central superior mesenteric vein and main portal vein. The tract was then dilated to 8 mm with an 8 mm x 8 cm Athletis balloon. A 8-10 mm by 7 + 2 cm of Viatorr endograft was placed. This was dilated to 8 mm. The indwelling 10 French sheath was then exchanged for a 16 French, 33 cm dry seal sheath. The sheath was directed to the portal aspect of the endograft. Aspiration thrombectomy was then performed in multiple passes over the wire through the main portal vein and into the central superior mesenteric vein. Moderate acute and chronic appearing thrombus were collected in the aspiration canister. A sample was sent for pathology. Repeat portal venogram demonstrated persistent occlusive appearing thrombus in the main portal vein and irregular luminal filling defects in the central superior mesenteric vein. Therefore, balloon angioplasty was performed with a 6 mm x 8 cm Athletis balloon. Repeat portal venogram demonstrated no evidence of extravasation in  mildly restored luminal gain in the central superior mesenteric vein. This region was then treated with a 6 mm x 8 cm In.Pact drug coated balloon prolonged inflation for total of 3 minutes. After angioplasty, the central superior mesenteric vein appear patent with inline flow into the indwelling tips endograft. Additional balloon angioplasty through the tips endograft within 8 mm balloon was performed. Repeat portal venogram demonstrated minimal improved patency. Additional aspiration thrombectomy was performed through the tips endograft which yielded moderate acute appearing thrombus. Repeat portal venogram demonstrated persistent near occlusive thrombus throughout the indwelling tips endograft in main portal vein. Therefore, mechanical thrombectomy was performed through the tips endograft and main portal vein into the central superior mesenteric vein with a 6 [place] cleaner device undergoing multiple passes. Repeat portal venogram demonstrated improved patency and inline flow via the indwelling tips, however with multiple irregular luminal filling defects and sluggish flow. Retrograde flow was noted into the left gastric vein which filled multiple small gastroesophageal varices. The indwelling tips endograft was then dilated with a 10 mm x 80 mm Athletis balloon. Repeat portal venogram demonstrated improved patency and antegrade flow through the indwelling endograft. There is persistent retrograde filling of the left gastric vein. Therefore, a 5 French C2 catheter was used to select the left gastric vein. Left gastric venogram demonstrated retrograde flow with opacification and irregular vascularity about multifocal small gastroesophageal varices. Therefore, coil embolization was performed about the central left gastric vein with 2, 6 mm 0.035 "Azur detachable coils. Completion portal venogram demonstrated successfully embolized left gastric vein with antegrade flow via patent main portal vein in newly placed TIPS  endograft, however several scattered filling defects remain in the main portal vein and in the endograft. The catheters and sheath were removed and manual compression was applied to the right internal jugular and right greater saphenous venous access sites until hemostasis was achieved. The patient was administered 60 mg of subcutaneous Lovenox. The patient was transferred to the PACU in stable condition. IMPRESSION: 1. 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 2. 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 3. Technically successful aspiration and mechanical thrombectomy of the superior mesenteric and main portal veins.  4. Technically successful plain and drug coated balloon angioplasty of the superior mesenteric and main portal veins. 5. Technically successful coil embolization of the left gastric vein. PLAN: Begin therapeutic Lovenox for 1 month prior to transitioning back to oral anticoagulation. IR will follow while inpatient and arrange outpatient followup within 1 month after discharge. Marliss Coots, MD [facility] Electronically Signed   By: Marliss Coots M.D.   On: 02/09/2023 22:10   CT ABDOMEN PELVIS W CONTRAST Result Date: 02/06/2023 CLINICAL DATA:  Pancreatitis, acute, severe Patient presents with abdominal pain. Radiologic records indicates history of pancreatic cancer. EXAM: CT ABDOMEN AND PELVIS WITH CONTRAST TECHNIQUE: Multidetector CT imaging of the abdomen and pelvis was performed using the standard protocol following bolus administration of intravenous contrast. RADIATION DOSE REDUCTION: This exam was performed according to the departmental dose-optimization program which includes automated exposure control, adjustment of the mA and/or kV according to patient size and/or use of iterative reconstruction technique.  CONTRAST:  75mL OMNIPAQUE IOHEXOL 350 MG/ML SOLN COMPARISON:  CT 8 days ago.  PET CT 10/28/2022 reviewed FINDINGS: Lower chest: Again seen linear atelectasis or scarring in the left greater than right lower lobe. 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 No pleural effusion. Hepatobiliary: Tiny hypodensity in the right lobe of the liver is unchanged series 3, image 9. No new intrahepatic abnormality. There is progressive portal vein thrombus, increasing volume of intrahepatic portal vein thrombus. The gallbladder is not well seen. Common bile duct is poorly defined on the current exam. Pancreas: Pancreatic atrophy with ductal dilatation, unchanged over the last 8 days. The known hypodense pancreatic mass is grossly unchanged measuring 2.2 cm series 3, image 23. This is less well-defined than on prior exam. Lesion abuts the SMV with progressive SMV thrombus. Peripancreatic fat stranding about the pancreatic head is minor. Spleen: Bilobed appearance of the spleen. No focal splenic abnormality. Adrenals/Urinary Tract: No adrenal nodule. Bilateral parapelvic cysts. No further follow-up imaging is recommended. No hydronephrosis or renal inflammation. Moderate bladder distension, no wall thickening. Stomach/Bowel: Detailed bowel assessment is limited in the absence of enteric contrast. Paraesophageal varices. Wall thickening about the distal stomach, reference series 3, image 21. Again seen wall thickening of the duodenum with adjacent stranding, for example series 3, image 25. No small bowel obstruction or additional small bowel wall thickening. No small bowel pneumatosis. There is wall thickening about the hepatic flexure of the colon series 3, image 26, increased from prior exam. Transverse colon is redundant. Small to moderate colonic stool burden. Vascular/Lymphatic: Progressive thrombus within the superior mesenteric vein which is now occlusive. Progressive thrombus within the main and intrahepatic portal  veins. The splenic vein remains patent. Increasing portosystemic collaterals. Aortic atherosclerosis. No bulky adenopathy. Reproductive: Nonacute. Other: Right upper quadrant fat stranding which is adjacent to gastric, duodenal and colonic wall thickening. No frank ascites. No free air. Right lateral lumbar hernia contains only fat. Musculoskeletal: Posterior rod with intrapedicular screw fusion L2 through L5. Additional multilevel degenerative change in the spine. No evidence of focal bone lesion. IMPRESSION: 1. Progressive thrombus within the superior mesenteric vein over the last 8 days, now occlusive. Progressive thrombus within the main and intrahepatic portal veins. 2. Known hypodense pancreatic mass is grossly unchanged measuring 2.2 cm. This is less well-defined than on prior exam. 3. Peripancreatic fat stranding is mild. 4. Persistent and progressive wall thickening about the distal stomach, duodenum, and hepatic flexure of the colon. Progressive right upper quadrant fat stranding. Etiology  of bowel wall thickening and inflammation is indeterminate. 5. Increasing portosystemic collaterals. Paraesophageal varices. Aortic Atherosclerosis (ICD10-I70.0). Electronically Signed   By: Narda Rutherford M.D.   On: 02/06/2023 17:21   ECHOCARDIOGRAM COMPLETE Result Date: 02/01/2023    ECHOCARDIOGRAM REPORT   Patient Name:   Sheryl Bender Date of Exam: 02/01/2023 Medical Rec #:  [id number]         Height:       62.5 in Accession #:    [id number]        Weight:       132.0 lb Date of Birth:  1946-01-03        BSA:          1.612 m Patient Age:    77 years          BP:           133/76 mmHg Patient Gender: F                 HR:           73 bpm. Exam Location:  Inpatient Procedure: 2D Echo, Color Doppler and Cardiac Doppler Indications:    Pre-op exam (TIPS)  History:        Patient has prior history of Echocardiogram examinations, most                 recent 03/18/2014. Risk Factors:Hypertension.  Sonographer:     Irving Burton Senior RDCS Referring Phys: [id number] Lynann Bologna HAN IMPRESSIONS  1. Left ventricular ejection fraction, by estimation, is 55 to 60%. The left ventricle has normal function. The left ventricle has no regional wall motion abnormalities. Left ventricular diastolic parameters are consistent with Grade I diastolic dysfunction (impaired relaxation).  2. Right ventricular systolic function is normal. The right ventricular size is normal. Tricuspid regurgitation signal is inadequate for assessing PA pressure.  3. A small pericardial effusion is present. The pericardial effusion is anterior to the right ventricle. There is no evidence of cardiac tamponade.  4. The mitral valve is normal in structure. Trivial mitral valve regurgitation. No evidence of mitral stenosis.  5. The aortic valve is tricuspid. There is mild calcification of the aortic valve. Aortic valve regurgitation is trivial. Aortic valve sclerosis is present, with no evidence of aortic valve stenosis.  6. The inferior vena cava is normal in size with greater than 50% respiratory variability, suggesting right atrial pressure of 3 mmHg. FINDINGS  Left Ventricle: Left ventricular ejection fraction, by estimation, is 55 to 60%. The left ventricle has normal function. The left ventricle has no regional wall motion abnormalities. The left ventricular internal cavity size was normal in size. There is  no left ventricular hypertrophy. Left ventricular diastolic parameters are consistent with Grade I diastolic dysfunction (impaired relaxation). Right Ventricle: The right ventricular size is normal. No increase in right ventricular wall thickness. Right ventricular systolic function is normal. Tricuspid regurgitation signal is inadequate for assessing PA pressure. Left Atrium: Left atrial size was normal in size. Right Atrium: Right atrial size was normal in size. Pericardium: A small pericardial effusion is present. The pericardial effusion is anterior to the right  ventricle. There is no evidence of cardiac tamponade. Presence of epicardial fat layer. Mitral Valve: The mitral valve is normal in structure. Trivial mitral valve regurgitation. No evidence of mitral valve stenosis. Tricuspid Valve: The tricuspid valve is normal in structure. Tricuspid valve regurgitation is trivial. No evidence of tricuspid stenosis. Aortic Valve: The aortic valve is tricuspid. There is  mild calcification of the aortic valve. Aortic valve regurgitation is trivial. Aortic valve sclerosis is present, with no evidence of aortic valve stenosis. Pulmonic Valve: The pulmonic valve was normal in structure. Pulmonic valve regurgitation is trivial. No evidence of pulmonic stenosis. Aorta: The aortic root and ascending aorta are structurally normal, with no evidence of dilitation. Venous: The inferior vena cava is normal in size with greater than 50% respiratory variability, suggesting right atrial pressure of 3 mmHg. IAS/Shunts: The atrial septum is grossly normal.  LEFT VENTRICLE PLAX 2D LVIDd:         4.10 cm   Diastology LVIDs:         2.90 cm   LV e' medial:    4.68 cm/s LV PW:         0.80 cm   LV E/e' medial:  12.0 LV IVS:        0.70 cm   LV e' lateral:   9.46 cm/s LVOT diam:     2.00 cm   LV E/e' lateral: 5.9 LV SV:         47 LV SV Index:   29 LVOT Area:     3.14 cm  RIGHT VENTRICLE RV S prime:     12.80 cm/s TAPSE (M-mode): 1.9 cm LEFT ATRIUM             Index        RIGHT ATRIUM           Index LA diam:        2.60 cm 1.61 cm/m   RA Area:     11.70 cm LA Vol (A2C):   33.8 ml 20.97 ml/m  RA Volume:   24.50 ml  15.20 ml/m LA Vol (A4C):   35.7 ml 22.15 ml/m LA Biplane Vol: 36.7 ml 22.77 ml/m  AORTIC VALVE LVOT Vmax:   69.80 cm/s LVOT Vmean:  52.400 cm/s LVOT VTI:    0.151 m  AORTA Ao Root diam: 3.10 cm Ao Asc diam:  3.20 cm MITRAL VALVE MV Area (PHT): 2.27 cm    SHUNTS MV E velocity: 56.10 cm/s  Systemic VTI:  0.15 m MV A velocity: 79.70 cm/s  Systemic Diam: 2.00 cm MV E/A ratio:  0.70  Lennie Odor MD Electronically signed by Lennie Odor MD Signature Date/Time: 02/01/2023/10:35:33 AM    Final    CT ABDOMEN PELVIS W CONTRAST Result Date: 01/29/2023 CLINICAL DATA:  Dark red rectal bleeding since last Thursday. Pancreatic cancer. * Tracking Code: BO * EXAM: CT ABDOMEN AND PELVIS WITH CONTRAST TECHNIQUE: Multidetector CT imaging of the abdomen and pelvis was performed using the standard protocol following bolus administration of intravenous contrast. RADIATION DOSE REDUCTION: This exam was performed according to the departmental dose-optimization program which includes automated exposure control, adjustment of the mA and/or kV according to patient size and/or use of iterative reconstruction technique. CONTRAST:  OMNIPAQUE IOHEXOL 300 MG/ML  SOLN COMPARISON:  CTA 01/12/2023. Older standard CT scans as well including September 2024. PET-CT 10/28/2022. MRI 10/18/2022. FINDINGS: Lower chest: Linear opacity lung bases likely scar or atelectasis. No pleural effusion. Hepatobiliary: Tiny right-sided dome hepatic cystic focus, unchanged from previous, nonspecific. 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 Pancreas: Severe pancreatic atrophy with ductal dilatation. There is transition point along a low-density mass seen along the head and uncinate process. This mass on series 2, image  27 measures 2.3 by 1.7 cm and is more defined today and larger than previous examinations. This has abuts the SMV. There is some progressive thrombus within the upper SMV which is nonocclusive. Preserved tissue plane to the celiac and SMA. See coronal image 32 of series 4. Spleen: Slightly lobular spleen which is nonenlarged. Adrenals/Urinary Tract: Prominent bilateral parapelvic renal cysts bilaterally. Small parenchymal cysts. No enhancing mass or collecting system dilatation. Preserved  contours of the urinary bladder. Stomach/Bowel: Once again there is significant wall thickening and edema along the antrum and pylorus of the stomach. Please correlate for gastritis. There is some increasing adjacent stranding. Stomach is nondilated. Small bowel is nondilated. Large bowel has a normal course and caliber with some scattered colonic stool. There is slight wall thickening along portion of the hepatic flexure in the porta hepatis with some adjacent increasing fluid. Normal appendix seen in the right lower quadrant. Vascular/Lymphatic: Aortic atherosclerosis. No enlarged abdominal or pelvic lymph nodes. Reproductive: Uterus and bilateral adnexa are unremarkable. Other: No free intra-air. Musculoskeletal: Streak artifact related to the significant hardware along the lumbar spine. Other areas of advanced degenerative changes along the thoracolumbar region. Degenerative changes of the pelvis. Critical Value/emergent results were called by telephone at the time of interpretation on 01/29/2023 at 1:45 pm to provider [facility] , who verbally acknowledged these results. 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 Again there is a low-density masslike area along the uncinate process with associated severe ductal dilatation in caliber change. This has consistent with patient's history of pancreatic neoplasm. No developing lymph node enlargement or new liver lesion at this time. Progressive wall thickening along the distal stomach towards the antrum and pylorus with the edema. Please correlate for peptic ulcer disease or other process. Increasing adjacent stranding and trace fluid extending towards the porta hepatis. Electronically Signed   By: Karen Kays M.D.   On: 01/29/2023 13:53    Micro Results    No results found  for this or any previous visit (from the past 240 hours).  Today   Subjective    Sheryl Bender today has no headache,no chest abdominal pain,no new weakness tingling or numbness, feels much better wants to go home today.    Objective   Blood pressure (!) 118/56, pulse 77, temperature 98 F (36.7 C), temperature source Oral, resp. rate 18, height 5' 2.5" (1.588 m), weight 59 kg, SpO2 98%.   Intake/Output Summary (Last 24 hours) at 02/13/2023 1011 Last data filed at 02/13/2023 0300 Gross per 24 hour  Intake 622 ml  Output 400 ml  Net 222 ml    Exam  Awake Alert, No new F.N deficits,    [place].AT,PERRAL Supple Neck,   Symmetrical Chest wall movement, Good air movement bilaterally, CTAB RRR,No Gallops,   +ve B.Sounds, Abd Soft, Non tender,  No Cyanosis, Clubbing or edema    Data Review   Recent Labs  Lab 02/09/23 0404 02/10/23 0040 02/10/23 1147 02/11/23 0406 02/11/23 1336 02/12/23 0330 02/12/23 1216 02/12/23 2113 02/13/23 0345  WBC 3.7* 8.2   < > 5.1 6.8 4.2 3.2* 3.6* 2.8*  HGB 8.1* 7.1*   < > 8.2* 9.4* 8.1* 7.9* 9.7* 9.2*  HCT 26.1* 22.4*   < > 25.5* 28.6* 25.0* 23.8* 28.8* 27.5*  PLT 157 171   < > 113*  141* 113* 111* 117* 115*  MCV 94.2 95.7   < > 89.8 88.8 89.0 88.8 87.8 87.9  MCH 29.2 30.3   < > 28.9 29.2 28.8 29.5 29.6 29.4  MCHC 31.0 31.7   < > 32.2 32.9 32.4 33.2 33.7 33.5  RDW 16.3* 16.7*   < > 18.2* 18.1* 17.7* 17.7* 17.3* 17.5*  LYMPHSABS 0.6* 0.6*  --  0.7  --  0.6*  --   --  0.5*  MONOABS 0.6 0.8  --  0.6  --  0.5  --   --  0.4  EOSABS 0.1 0.0  --  0.0  --  0.1  --   --  0.0  BASOSABS 0.0 0.0  --  0.0  --  0.0  --   --  0.0   < > = values in this interval not displayed.    Recent Labs  Lab 02/09/23 0404 02/10/23 0040 02/11/23 0406 02/12/23 0330 02/13/23 0345  NA 141 140 137 137 140  K 3.6 3.5 3.7 3.3* 3.7  CL 111 108 108 107 111  CO2 22 22 22 22 22   ANIONGAP 8 10 7 8 7   GLUCOSE 109* 130* 99 89 104*  BUN 9 14 15 11 8   CREATININE 0.84 0.93  0.94 0.93 0.95  AST 22 91* 48* 31 27  ALT 27 82* 64* 50* 40  ALKPHOS 100 89 84 86 77  BILITOT 0.6 0.7 0.8 0.6 0.4  ALBUMIN 2.6* 2.7* 2.3* 2.3* 2.2*  INR 1.1  --   --   --   --   BNP 16.5 57.1 27.4 32.2  --   MG 2.1 2.1 2.1 2.1  --   CALCIUM 8.5* 8.0* 7.7* 8.0* 8.1*    Total Time in preparing paper work, data evaluation and todays exam - 35 minutes  Signature  -    Susa Raring M.D on 02/13/2023 at 10:11 AM   -  To page go to [email or web address]

## 2023-02-13 NOTE — Discharge Instructions (Signed)
Follow with Primary MD [name], DO in 3 days, also follow-up with the recommended gastroenterologist, IR physician and oncologist and recommended timeframe  Get CBC, CMP -  checked next visit with your primary MD   Activity: As tolerated with Full fall precautions use walker/cane & assistance as needed  Disposition Home    Diet: Heart Healthy   Special Instructions: If you have smoked or chewed Tobacco  in the last 2 yrs please stop smoking, stop any regular Alcohol  and or any Recreational drug use.  On your next visit with your primary care physician please Get Medicines reviewed and adjusted.  Please request your Prim.MD to go over all Hospital Tests and Procedure/Radiological results at the follow up, please get all Hospital records sent to your Prim MD by signing hospital release before you go home.  If you experience worsening of your admission symptoms, develop shortness of breath, life threatening emergency, suicidal or homicidal thoughts you must seek medical attention immediately by calling 911 or calling your MD immediately  if symptoms less severe.  You Must read complete instructions/literature along with all the possible adverse reactions/side effects for all the Medicines you take and that have been prescribed to you. Take any new Medicines after you have completely understood and accpet all the possible adverse reactions/side effects.   Do not drive when taking Pain medications.  Do not take more than prescribed Pain, Sleep and Anxiety Medications

## 2023-02-15 ENCOUNTER — Telehealth: Payer: Medicare Other

## 2023-02-15 ENCOUNTER — Inpatient Hospital Stay (HOSPITAL_COMMUNITY)
Admission: RE | Admit: 2023-02-15 | Payer: Medicare Other | Source: Home / Self Care | Admitting: Interventional Radiology

## 2023-02-15 ENCOUNTER — Encounter (HOSPITAL_COMMUNITY): Admission: RE | Payer: Self-pay | Source: Home / Self Care

## 2023-02-15 ENCOUNTER — Other Ambulatory Visit (HOSPITAL_COMMUNITY): Payer: Medicare Other

## 2023-02-15 SURGERY — TRANS-JUGULAR INTRAHEPATIC PORTAL SHUNT (TIPS)
Anesthesia: General

## 2023-02-17 ENCOUNTER — Other Ambulatory Visit: Payer: Self-pay | Admitting: Hematology

## 2023-02-17 ENCOUNTER — Inpatient Hospital Stay (HOSPITAL_COMMUNITY)
Admission: EM | Admit: 2023-02-17 | Discharge: 2023-03-01 | DRG: 270 | Disposition: A | Discharge status: Hospice | Payer: Medicare Other | Attending: Student | Admitting: Student

## 2023-02-17 ENCOUNTER — Other Ambulatory Visit: Payer: Self-pay

## 2023-02-17 ENCOUNTER — Telehealth: Payer: Self-pay | Admitting: *Deleted

## 2023-02-17 ENCOUNTER — Encounter (HOSPITAL_COMMUNITY): Payer: Self-pay | Admitting: Emergency Medicine

## 2023-02-17 ENCOUNTER — Ambulatory Visit (HOSPITAL_COMMUNITY)
Admission: RE | Admit: 2023-02-17 | Discharge: 2023-02-17 | Disposition: A | Discharge status: Home / Self Care | Payer: Medicare Other | Source: Ambulatory Visit | Attending: Hematology | Admitting: Hematology

## 2023-02-17 ENCOUNTER — Inpatient Hospital Stay: Payer: Medicare Other

## 2023-02-17 DIAGNOSIS — D500 Iron deficiency anemia secondary to blood loss (chronic): Secondary | ICD-10-CM | POA: Diagnosis present

## 2023-02-17 DIAGNOSIS — K922 Gastrointestinal hemorrhage, unspecified: Principal | ICD-10-CM | POA: Diagnosis present

## 2023-02-17 DIAGNOSIS — Z7901 Long term (current) use of anticoagulants: Secondary | ICD-10-CM

## 2023-02-17 DIAGNOSIS — J9601 Acute respiratory failure with hypoxia: Secondary | ICD-10-CM | POA: Diagnosis not present

## 2023-02-17 DIAGNOSIS — D62 Acute posthemorrhagic anemia: Secondary | ICD-10-CM | POA: Diagnosis not present

## 2023-02-17 DIAGNOSIS — I129 Hypertensive chronic kidney disease with stage 1 through stage 4 chronic kidney disease, or unspecified chronic kidney disease: Secondary | ICD-10-CM | POA: Diagnosis present

## 2023-02-17 DIAGNOSIS — K31811 Angiodysplasia of stomach and duodenum with bleeding: Secondary | ICD-10-CM | POA: Diagnosis present

## 2023-02-17 DIAGNOSIS — I10 Essential (primary) hypertension: Secondary | ICD-10-CM | POA: Diagnosis not present

## 2023-02-17 DIAGNOSIS — J690 Pneumonitis due to inhalation of food and vomit: Secondary | ICD-10-CM | POA: Diagnosis not present

## 2023-02-17 DIAGNOSIS — F32A Depression, unspecified: Secondary | ICD-10-CM | POA: Diagnosis present

## 2023-02-17 DIAGNOSIS — Z9221 Personal history of antineoplastic chemotherapy: Secondary | ICD-10-CM | POA: Diagnosis not present

## 2023-02-17 DIAGNOSIS — K449 Diaphragmatic hernia without obstruction or gangrene: Secondary | ICD-10-CM | POA: Diagnosis not present

## 2023-02-17 DIAGNOSIS — C250 Malignant neoplasm of head of pancreas: Secondary | ICD-10-CM | POA: Insufficient documentation

## 2023-02-17 DIAGNOSIS — D509 Iron deficiency anemia, unspecified: Secondary | ICD-10-CM

## 2023-02-17 DIAGNOSIS — Z923 Personal history of irradiation: Secondary | ICD-10-CM | POA: Diagnosis not present

## 2023-02-17 DIAGNOSIS — Z86718 Personal history of other venous thrombosis and embolism: Secondary | ICD-10-CM | POA: Diagnosis not present

## 2023-02-17 DIAGNOSIS — I81 Portal vein thrombosis: Secondary | ICD-10-CM | POA: Diagnosis present

## 2023-02-17 DIAGNOSIS — Z66 Do not resuscitate: Secondary | ICD-10-CM | POA: Diagnosis present

## 2023-02-17 DIAGNOSIS — Z981 Arthrodesis status: Secondary | ICD-10-CM | POA: Diagnosis not present

## 2023-02-17 DIAGNOSIS — E872 Acidosis, unspecified: Secondary | ICD-10-CM | POA: Diagnosis not present

## 2023-02-17 DIAGNOSIS — K254 Chronic or unspecified gastric ulcer with hemorrhage: Secondary | ICD-10-CM | POA: Diagnosis present

## 2023-02-17 DIAGNOSIS — M1990 Unspecified osteoarthritis, unspecified site: Secondary | ICD-10-CM | POA: Diagnosis present

## 2023-02-17 DIAGNOSIS — K55059 Acute (reversible) ischemia of intestine, part and extent unspecified: Secondary | ICD-10-CM | POA: Diagnosis present

## 2023-02-17 DIAGNOSIS — D631 Anemia in chronic kidney disease: Secondary | ICD-10-CM | POA: Diagnosis present

## 2023-02-17 DIAGNOSIS — Z79899 Other long term (current) drug therapy: Secondary | ICD-10-CM

## 2023-02-17 DIAGNOSIS — Z888 Allergy status to other drugs, medicaments and biological substances status: Secondary | ICD-10-CM

## 2023-02-17 DIAGNOSIS — I8500 Esophageal varices without bleeding: Secondary | ICD-10-CM | POA: Diagnosis present

## 2023-02-17 DIAGNOSIS — K31819 Angiodysplasia of stomach and duodenum without bleeding: Secondary | ICD-10-CM | POA: Diagnosis not present

## 2023-02-17 DIAGNOSIS — T82868A Thrombosis of vascular prosthetic devices, implants and grafts, initial encounter: Principal | ICD-10-CM | POA: Diagnosis present

## 2023-02-17 DIAGNOSIS — Z91018 Allergy to other foods: Secondary | ICD-10-CM

## 2023-02-17 DIAGNOSIS — Z86711 Personal history of pulmonary embolism: Secondary | ICD-10-CM | POA: Diagnosis not present

## 2023-02-17 DIAGNOSIS — K3189 Other diseases of stomach and duodenum: Secondary | ICD-10-CM | POA: Diagnosis not present

## 2023-02-17 DIAGNOSIS — Q393 Congenital stenosis and stricture of esophagus: Secondary | ICD-10-CM | POA: Diagnosis not present

## 2023-02-17 DIAGNOSIS — I959 Hypotension, unspecified: Secondary | ICD-10-CM | POA: Diagnosis not present

## 2023-02-17 DIAGNOSIS — I864 Gastric varices: Secondary | ICD-10-CM | POA: Diagnosis present

## 2023-02-17 DIAGNOSIS — E876 Hypokalemia: Secondary | ICD-10-CM | POA: Diagnosis present

## 2023-02-17 DIAGNOSIS — K921 Melena: Secondary | ICD-10-CM

## 2023-02-17 DIAGNOSIS — D696 Thrombocytopenia, unspecified: Secondary | ICD-10-CM | POA: Diagnosis not present

## 2023-02-17 DIAGNOSIS — K625 Hemorrhage of anus and rectum: Principal | ICD-10-CM | POA: Diagnosis present

## 2023-02-17 DIAGNOSIS — C253 Malignant neoplasm of pancreatic duct: Secondary | ICD-10-CM | POA: Insufficient documentation

## 2023-02-17 DIAGNOSIS — K55069 Acute infarction of intestine, part and extent unspecified: Secondary | ICD-10-CM | POA: Diagnosis not present

## 2023-02-17 DIAGNOSIS — F419 Anxiety disorder, unspecified: Secondary | ICD-10-CM | POA: Diagnosis present

## 2023-02-17 DIAGNOSIS — K219 Gastro-esophageal reflux disease without esophagitis: Secondary | ICD-10-CM | POA: Diagnosis present

## 2023-02-17 DIAGNOSIS — Z515 Encounter for palliative care: Secondary | ICD-10-CM | POA: Diagnosis not present

## 2023-02-17 DIAGNOSIS — Z7189 Other specified counseling: Secondary | ICD-10-CM | POA: Diagnosis not present

## 2023-02-17 DIAGNOSIS — C259 Malignant neoplasm of pancreas, unspecified: Secondary | ICD-10-CM | POA: Diagnosis present

## 2023-02-17 LAB — CBC WITH DIFFERENTIAL/PLATELET
Abs Immature Granulocytes: 0.03 10*3/uL (ref 0.00–0.07)
Basophils Absolute: 0 10*3/uL (ref 0.0–0.1)
Basophils Relative: 0 %
Eosinophils Absolute: 0.1 10*3/uL (ref 0.0–0.5)
Eosinophils Relative: 2 %
HCT: 29.4 % — ABNORMAL LOW (ref 36.0–46.0)
Hemoglobin: 9.6 g/dL — ABNORMAL LOW (ref 12.0–15.0)
Immature Granulocytes: 1 %
Lymphocytes Relative: 11 %
Lymphs Abs: 0.4 10*3/uL — ABNORMAL LOW (ref 0.7–4.0)
MCH: 29.5 pg (ref 26.0–34.0)
MCHC: 32.7 g/dL (ref 30.0–36.0)
MCV: 90.5 fL (ref 80.0–100.0)
Monocytes Absolute: 0.5 10*3/uL (ref 0.1–1.0)
Monocytes Relative: 13 %
Neutro Abs: 2.7 10*3/uL (ref 1.7–7.7)
Neutrophils Relative %: 73 %
Platelets: 175 10*3/uL (ref 150–400)
RBC: 3.25 MIL/uL — ABNORMAL LOW (ref 3.87–5.11)
RDW: 17.8 % — ABNORMAL HIGH (ref 11.5–15.5)
WBC: 3.7 10*3/uL — ABNORMAL LOW (ref 4.0–10.5)
nRBC: 0 % (ref 0.0–0.2)

## 2023-02-17 LAB — COMPREHENSIVE METABOLIC PANEL
ALT: 46 U/L — ABNORMAL HIGH (ref 0–44)
ALT: 52 U/L — ABNORMAL HIGH (ref 0–44)
AST: 40 U/L (ref 15–41)
AST: 50 U/L — ABNORMAL HIGH (ref 15–41)
Albumin: 2.8 g/dL — ABNORMAL LOW (ref 3.5–5.0)
Albumin: 2.7 g/dL — ABNORMAL LOW (ref 3.5–5.0)
Alkaline Phosphatase: 98 U/L (ref 38–126)
Alkaline Phosphatase: 104 U/L (ref 38–126)
Anion gap: 7 (ref 5–15)
Anion gap: 10 (ref 5–15)
BUN: 20 mg/dL (ref 8–23)
BUN: 21 mg/dL (ref 8–23)
CO2: 24 mmol/L (ref 22–32)
CO2: 22 mmol/L (ref 22–32)
Calcium: 8.5 mg/dL — ABNORMAL LOW (ref 8.9–10.3)
Calcium: 8.8 mg/dL — ABNORMAL LOW (ref 8.9–10.3)
Chloride: 112 mmol/L — ABNORMAL HIGH (ref 98–111)
Chloride: 110 mmol/L (ref 98–111)
Creatinine, Ser: 0.68 mg/dL (ref 0.44–1.00)
Creatinine, Ser: 0.76 mg/dL (ref 0.44–1.00)
GFR, Estimated: >60 mL/min (ref >60)
GFR, Estimated: >60 mL/min (ref >60)
Glucose, Bld: 108 mg/dL — ABNORMAL HIGH (ref 70–99)
Glucose, Bld: 112 mg/dL — ABNORMAL HIGH (ref 70–99)
Potassium: 3.3 mmol/L — ABNORMAL LOW (ref 3.5–5.1)
Potassium: 3.7 mmol/L (ref 3.5–5.1)
Sodium: 143 mmol/L (ref 135–145)
Sodium: 142 mmol/L (ref 135–145)
Total Bilirubin: 0.7 mg/dL (ref 0.0–1.2)
Total Bilirubin: 0.4 mg/dL (ref 0.0–1.2)
Total Protein: 5.6 g/dL — ABNORMAL LOW (ref 6.5–8.1)
Total Protein: 5.7 g/dL — ABNORMAL LOW (ref 6.5–8.1)

## 2023-02-17 LAB — CBC
HCT: 31.1 % — ABNORMAL LOW (ref 36.0–46.0)
Hemoglobin: 9.9 g/dL — ABNORMAL LOW (ref 12.0–15.0)
MCH: 29 pg (ref 26.0–34.0)
MCHC: 31.8 g/dL (ref 30.0–36.0)
MCV: 91.2 fL (ref 80.0–100.0)
Platelets: 200 10*3/uL (ref 150–400)
RBC: 3.41 MIL/uL — ABNORMAL LOW (ref 3.87–5.11)
RDW: 17.8 % — ABNORMAL HIGH (ref 11.5–15.5)
WBC: 3.8 10*3/uL — ABNORMAL LOW (ref 4.0–10.5)
nRBC: 0 % (ref 0.0–0.2)

## 2023-02-17 LAB — TYPE AND SCREEN
ABO/RH(D): A POS
Antibody Screen: NEGATIVE

## 2023-02-17 LAB — MAGNESIUM: Magnesium: 2.2 mg/dL (ref 1.7–2.4)

## 2023-02-17 LAB — SAMPLE TO BLOOD BANK

## 2023-02-17 LAB — POC OCCULT BLOOD, ED: Fecal Occult Bld: POSITIVE — AB

## 2023-02-17 MED ORDER — BUPROPION HCL ER (XL) 150 MG PO TB24
300.0000 mg | ORAL_TABLET | Freq: Every day | ORAL | Status: DC
  Administered 2023-02-18 – 2023-03-01 (×12): 300 mg via ORAL
  Filled 2023-02-17 (×12): qty 2

## 2023-02-17 MED ORDER — POLYETHYLENE GLYCOL 3350 17 G PO PACK
17.0000 g | PACK | Freq: Every day | ORAL | Status: DC | PRN
Start: 2023-02-17 — End: 2023-03-01
  Administered 2023-02-27: 17 g via ORAL
  Filled 2023-02-17: qty 1

## 2023-02-17 MED ORDER — VITAMIN D 25 MCG (1000 UNIT) PO TABS
10000.0000 [IU] | ORAL_TABLET | Freq: Every morning | ORAL | Status: DC
  Administered 2023-02-18 – 2023-03-01 (×12): 10000 [IU] via ORAL
  Filled 2023-02-17 (×14): qty 10

## 2023-02-17 MED ORDER — ACETAMINOPHEN 325 MG PO TABS
650.0000 mg | ORAL_TABLET | Freq: Four times a day (QID) | ORAL | Status: DC | PRN
  Administered 2023-02-17 – 2023-02-28 (×13): 650 mg via ORAL
  Filled 2023-02-17 (×13): qty 2

## 2023-02-17 MED ORDER — MIRTAZAPINE 15 MG PO TABS
15.0000 mg | ORAL_TABLET | Freq: Every day | ORAL | Status: DC
  Administered 2023-02-17 – 2023-02-27 (×11): 15 mg via ORAL
  Filled 2023-02-17 (×11): qty 1

## 2023-02-17 MED ORDER — PANTOPRAZOLE SODIUM 40 MG IV SOLR
40.0000 mg | Freq: Two times a day (BID) | INTRAVENOUS | Status: DC
  Administered 2023-02-18 – 2023-03-01 (×23): 40 mg via INTRAVENOUS
  Filled 2023-02-17 (×24): qty 10

## 2023-02-17 MED ORDER — CALCIUM CARBONATE ANTACID 500 MG PO CHEW
1.0000 | CHEWABLE_TABLET | Freq: Every day | ORAL | Status: DC | PRN
  Administered 2023-02-24: 200 mg via ORAL
  Filled 2023-02-17: qty 1

## 2023-02-17 MED ORDER — SODIUM CHLORIDE 0.9% FLUSH
3.0000 mL | Freq: Two times a day (BID) | INTRAVENOUS | Status: DC
  Administered 2023-02-18 – 2023-03-01 (×17): 3 mL via INTRAVENOUS

## 2023-02-17 MED ORDER — PANTOPRAZOLE SODIUM 40 MG IV SOLR
40.0000 mg | INTRAVENOUS | Status: AC
  Filled 2023-02-17: qty 10

## 2023-02-17 MED ORDER — BUSPIRONE HCL 10 MG PO TABS
20.0000 mg | ORAL_TABLET | Freq: Two times a day (BID) | ORAL | Status: DC
Start: 2023-02-17 — End: 2023-02-28
  Administered 2023-02-17 – 2023-02-28 (×22): 20 mg via ORAL
  Filled 2023-02-17 (×22): qty 2

## 2023-02-17 MED ORDER — ESCITALOPRAM OXALATE 10 MG PO TABS
20.0000 mg | ORAL_TABLET | Freq: Every day | ORAL | Status: DC
  Administered 2023-02-17 – 2023-02-27 (×11): 20 mg via ORAL
  Filled 2023-02-17 (×11): qty 2

## 2023-02-17 MED ORDER — GADOBUTROL 1 MMOL/ML IV SOLN
6.0000 mL | Freq: Once | INTRAVENOUS | Status: AC | PRN
  Administered 2023-02-17: 6 mL via INTRAVENOUS

## 2023-02-17 MED ORDER — LACTATED RINGERS IV BOLUS
500.0000 mL | Freq: Once | INTRAVENOUS | Status: AC
  Administered 2023-02-18: 500 mL via INTRAVENOUS

## 2023-02-17 MED ORDER — DEXTROSE IN LACTATED RINGERS 5 % IV SOLN
INTRAVENOUS | Status: AC
Start: 2023-02-17 — End: 2023-02-18

## 2023-02-17 MED ORDER — HEPARIN SOD (PORK) LOCK FLUSH 100 UNIT/ML IV SOLN
500.0000 [IU] | Freq: Once | INTRAVENOUS | Status: AC
  Administered 2023-02-17: 500 [IU] via INTRAVENOUS

## 2023-02-17 MED ORDER — SODIUM CHLORIDE 0.9% FLUSH
10.0000 mL | INTRAVENOUS | Status: DC | PRN
  Administered 2023-02-17: 10 mL via INTRAVENOUS

## 2023-02-17 MED ORDER — ACETAMINOPHEN 650 MG RE SUPP: 650.0000 mg | Freq: Four times a day (QID) | RECTAL | Status: DC | PRN

## 2023-02-17 MED ORDER — SUCRALFATE 1 G PO TABS
1.0000 g | ORAL_TABLET | Freq: Three times a day (TID) | ORAL | Status: DC
  Administered 2023-02-18 – 2023-03-01 (×43): 1 g via ORAL
  Filled 2023-02-17 (×43): qty 1

## 2023-02-17 MED ORDER — QUETIAPINE FUMARATE 25 MG PO TABS
25.0000 mg | ORAL_TABLET | Freq: Two times a day (BID) | ORAL | Status: DC
Start: 2023-02-17 — End: 2023-02-28
  Administered 2023-02-17 – 2023-02-28 (×22): 25 mg via ORAL
  Filled 2023-02-17 (×22): qty 1

## 2023-02-17 NOTE — ED Notes (Signed)
This RN at bedside as chaperone for rectal exam

## 2023-02-17 NOTE — ED Provider Notes (Signed)
[place] EMERGENCY DEPARTMENT AT [facility] Provider Note   CSN: [id number] Arrival date & time: 02/17/23  1701     History  Chief Complaint  Patient presents with   Rectal Bleeding    Sheryl Bender is a 78 y.o. female.  78 year old female with a history of pancreatic cancer, multiple GI bleeds, PE on Lovenox, and SMV status post resection who presents to the emergency department with bloody stools.  Says that since her last hospitalization for GI bleed she was discharged on 02/13/2023.  Has been having intermittent maroon-colored stools but today had some bright red blood.  Feels more fatigued than usual.  Is very concerned because she is taking Lovenox for her PE and is worried about her bleeding getting worse.  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       Home Medications Prior to Admission medications   Medication Sig Start Date End Date Taking? Authorizing Provider  acetaminophen (TYLENOL) 500 MG tablet Take 2 tablets (1,000 mg total) by mouth every 8 (eight) hours as needed for moderate pain, headache or fever. Patient taking differently: Take 750-1,000 mg by mouth every 8 (eight) hours as needed for moderate pain (pain score 4-6), headache or fever. 07/21/21  Yes Vassie Loll, MD  buPROPion (WELLBUTRIN XL) 300 MG 24 hr tablet Take 300 mg by mouth daily after breakfast. 06/15/21  Yes [provider]  busPIRone (BUSPAR) 10 MG tablet Take 20 mg by mouth 2 (two) times daily. 01/27/21  Yes [provider]  calcium carbonate (TUMS - DOSED IN MG ELEMENTAL CALCIUM) 500 MG chewable tablet Chew 1 tablet by mouth daily as needed for indigestion or heartburn.   Yes [provider]  Cholecalciferol (VITAMIN D3) 125 MCG (5000 UT) CAPS Take 10,000 Units by mouth every morning.   Yes [provider]  enoxaparin (LOVENOX) 60 MG/0.6ML injection Inject 0.6 mLs (60 mg total)  into the skin every 12 (twelve) hours. 02/12/23  Yes Leroy Sea, MD  escitalopram (LEXAPRO) 20 MG tablet Take 20 mg by mouth at bedtime.   Yes [provider]  lactose free nutrition (BOOST) LIQD Take 237 mLs by mouth 2 (two) times daily between meals.   Yes [provider]  LINZESS 72 MCG capsule TAKE 1 CAPSULE BY MOUTH DAILY BEFORE BREAKFAST Patient taking differently: Take 72 mcg by mouth daily before breakfast. 01/20/23  Yes Mansouraty, Netty Starring., MD  mirtazapine (REMERON) 30 MG tablet Take 15 mg by mouth at bedtime.   Yes [provider]  pantoprazole (PROTONIX) 40 MG tablet Take 1 tablet (40 mg total) by mouth 2 (two) times daily. 02/02/23 05/03/23 Yes Carollee Herter, DO  QUEtiapine (SEROQUEL) 25 MG tablet Take 25 mg by mouth 2 (two) times daily. 12/14/22  Yes [provider]  sucralfate (CARAFATE) 1 g tablet Take 1 tablet (1 g total) by mouth 4 (four) times daily -  with meals and at bedtime for 14 days. 02/12/23 02/26/23 Yes Leroy Sea, MD  lidocaine-prilocaine (EMLA) cream Apply a small amount to port a cath site (do not rub in) and cover with plastic wrap 1 hour prior to infusion appointments Patient not taking: Reported on 02/17/2023 09/15/21   Doreatha Massed, MD  ondansetron (ZOFRAN) 4 MG tablet Take 1 tablet (4 mg total) by mouth every 6 (six) hours as needed for nausea or vomiting. Patient not taking: Reported on 02/17/2023 02/02/23 03/04/23  Carollee Herter, DO      Allergies    Other, Cherry, and Wound dressing adhesive    Review of Systems   Review of Systems  Physical Exam Updated Vital Signs BP 121/62   Pulse 94   Temp 98.1 F (36.7 C) (Oral)   Resp 18   SpO2 95%  Physical Exam Vitals and nursing note reviewed.  Constitutional:      General: She is not in acute distress.    Appearance: She is well-developed.  HENT:     Head: Normocephalic and atraumatic.     Right Ear: External ear normal.     Left Ear: External ear normal.      Nose: Nose normal.  Eyes:     Extraocular Movements: Extraocular movements intact.     Conjunctiva/sclera: Conjunctivae normal.     Pupils: Pupils are equal, round, and reactive to light.  Pulmonary:     Effort: Pulmonary effort is normal. No respiratory distress.  Abdominal:     General: Abdomen is flat. There is no distension.     Palpations: Abdomen is soft. There is no mass.     Tenderness: There is no abdominal tenderness. There is no guarding.  Genitourinary:    Comments: Chaperoned by patient's RN Joni Reining. No external hemorrhoids or fissures noted on external inspection. No internal masses or hemorroids noted. Normal rectal tone.  Maroon-colored stool noted that was Hemoccult positive. Musculoskeletal:     Cervical back: Normal range of motion and neck supple.     Right lower leg: No edema.     Left lower leg: No edema.  Skin:    General: Skin is warm and dry.  Neurological:     Mental Status: She is alert and oriented to person, place, and time. Mental status is at baseline.  Psychiatric:        Mood and Affect: Mood normal.     ED Results / Procedures / Treatments   Labs (all labs ordered are listed, but only abnormal results are displayed) Labs Reviewed  COMPREHENSIVE METABOLIC PANEL - Abnormal; Notable for the following components:      Result Value   Glucose, Bld 112 (*)    Calcium 8.8 (*)    Total Protein 5.7 (*)    Albumin 2.7 (*)    AST 50 (*)    ALT 52 (*)    All other components within normal limits  CBC - Abnormal; Notable for the following components:   WBC 3.8 (*)    RBC 3.41 (*)    Hemoglobin 9.9 (*)    HCT 31.1 (*)    RDW 17.8 (*)    All other components within normal limits  BASIC METABOLIC PANEL - Abnormal; Notable for the following components:   Potassium 3.2 (*)    Chloride 112 (*)    Glucose, Bld 125 (*)    Calcium 8.2 (*)    All other components within normal limits  CBC - Abnormal; Notable for the following components:   WBC 2.3 (*)     RBC 2.84 (*)    Hemoglobin 8.2 (*)    HCT 25.9 (*)    RDW 17.6 (*)    All other components within normal limits  POC OCCULT BLOOD, ED - Abnormal  POC OCCULT BLOOD, ED - Abnormal; Notable for the following components:   Fecal Occult Bld POSITIVE (*)    All other components within normal limits  PROTIME-INR  APTT  HEMOGLOBIN AND HEMATOCRIT, BLOOD  HEMOGLOBIN AND  HEMATOCRIT, BLOOD  HEMOGLOBIN AND HEMATOCRIT, BLOOD  TYPE AND SCREEN    EKG None  Radiology No results found.  Procedures Procedures    Medications Ordered in ED Medications  pantoprazole (PROTONIX) injection 40 mg ( Intravenous Canceled Entry 02/17/23 2233)    Followed by  pantoprazole (PROTONIX) injection 40 mg (40 mg Intravenous Given 02/18/23 0915)  dextrose 5 % in lactated ringers infusion ( Intravenous New Bag/Given 02/18/23 0126)  calcium carbonate (TUMS - dosed in mg elemental calcium) chewable tablet 200 mg of elemental calcium (has no administration in time range)  busPIRone (BUSPAR) tablet 20 mg (20 mg Oral Given 02/18/23 0916)  buPROPion (WELLBUTRIN XL) 24 hr tablet 300 mg (300 mg Oral Given 02/18/23 0916)  escitalopram (LEXAPRO) tablet 20 mg (20 mg Oral Given 02/17/23 2331)  mirtazapine (REMERON) tablet 15 mg (15 mg Oral Given 02/17/23 2331)  QUEtiapine (SEROQUEL) tablet 25 mg (25 mg Oral Given 02/18/23 0916)  sucralfate (CARAFATE) tablet 1 g (1 g Oral Given 02/18/23 0748)  cholecalciferol (VITAMIN D3) 25 MCG (1000 UNIT) tablet 10,000 Units (10,000 Units Oral Given 02/18/23 0916)  acetaminophen (TYLENOL) tablet 650 mg (650 mg Oral Given 02/17/23 2331)    Or  acetaminophen (TYLENOL) suppository 650 mg ( Rectal See Alternative 02/17/23 2331)  polyethylene glycol (MIRALAX / GLYCOLAX) packet 17 g (has no administration in time range)  sodium chloride flush (NS) 0.9 % injection 3 mL (3 mLs Intravenous Given 02/18/23 0918)  sodium chloride flush (NS) 0.9 % injection 10-40 mL (10 mLs Intracatheter Given 02/18/23 0918)   sodium chloride flush (NS) 0.9 % injection 10-40 mL (10 mLs Intracatheter Given 02/18/23 0045)  Chlorhexidine Gluconate Cloth 2 % PADS 6 each (6 each Topical Not Given 02/18/23 1031)  lactated ringers bolus 500 mL (0 mLs Intravenous Stopped 02/18/23 0117)  pantoprazole (PROTONIX) injection 40 mg (40 mg Intravenous Given 02/18/23 0121)    ED Course/ Medical Decision Making/ A&P Clinical Course as of 02/18/23 1129  Thu Feb 17, 2023  2136 Discussed with Dr. Maryjean Ka for admission [RP]  2136 Dr. Marina Goodell from [facility] will see the patient in the morning with their team [RP]    Clinical Course User Index [RP] Rondel Baton, MD                                 Medical Decision Making Amount and/or Complexity of Data Reviewed Labs: ordered.  Risk Prescription drug management. Decision regarding hospitalization.   Sheryl Bender is a 78 y.o. female with comorbidities that complicate the patient evaluation including pancreatic cancer, multiple GI bleeds, PE on Lovenox, and SMV status post resection who presents to the emergency department with bloody stools.   Initial Ddx:  Upper GI bleed, peptic ulcer, lower GI bleed, diverticulosis, malignancy, AVM, hemorrhoids, variceal bleed, symptomatic anemia  MDM:  Concerned that the patient may have a GI bleed based on their symptoms.  No apparent bleeding hemorrhoids on exam.  Will go ahead and start the patient on Protonix and consult GI.  Did not appear to be having symptomatic anemia at this time.  Low concern for variceal bleed given the patient's history.  Plan:  Labs Type and screen Hemoccult Protonix GI consult  ED Summary/Re-evaluation:  Labs returned and showed that her hemoglobin is close to her baseline at this time.  Given the fact that she is on anticoagulation and has required transfusions in the past and has Hemoccult  positive stool today we will admit her for observation.  GI consulted and will see the patient in the morning.   Hospitalist to admit.  This patient presents to the ED for concern of complaints listed in HPI, this involves an extensive number of treatment options, and is a complaint that carries with it a high risk of complications and morbidity. Disposition including potential need for admission considered.   Dispo: Admit to Floor  Records reviewed Outpatient Clinic Notes The following labs were independently interpreted: CBC and show chronic anemia I personally reviewed and interpreted cardiac monitoring: normal sinus rhythm  I personally reviewed and interpreted the pt's EKG: see above for interpretation  I have reviewed the patients home medications and made adjustments as needed Consults: Gastroenterology and Hospitalist Social Determinants of health:  Elderly   Final Clinical Impression(s) / ED Diagnoses Final diagnoses:  Acute GI bleeding  Current use of long term anticoagulation    Rx / DC Orders ED Discharge Orders     None         Rondel Baton, MD 02/18/23 1129

## 2023-02-17 NOTE — H&P (Signed)
History and Physical    [name] ION:[id number] DOB: 18-Dec-1945 DOA: 02/17/2023 DOS: the patient was seen and examined on 02/17/2023 PCP: [name], DO  Patient coming from: Home  Chief Complaint:  Chief Complaint  Patient presents with   Rectal Bleeding   HPI: Sheryl Bender is a 78 y.o. female with medical history :   significant of Stage 1 pancreatic cancer s/p chemoradiation, pulmonary embolism on Eliquis, iron-deficiency anemia, interstitial pancreatitis, HTN, recurrent GI bleed  . Patient hospitalized from 12/2-12/5 with GI bleed requiring transfusion. 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 Colonoscopy with no active bleeding, minimal amount of old blood in the cecum, 6 mm polyp removed from mid sigmoid colon. She then resumed Eliquis on 12/11 and was re-admitted from 12/18-12/21 with recurrent GI bleed requiring transfusion. EGD found to have hemorrhagic gastritis, treated with APC, bipolar cautery. GAVE is also possibility. GI recommended PPI twice daily for 4 weeks then daily indefinitely. Asked to resume Eliquis on 12/28.   Hospitalized again 1/4 -1/8 for GI bleed. EGD Gastritis with hemorrhage. Treated with argon plasma coagulation (APC). Non-bleeding gastric ulcers with no stigmata of bleeding. Duodenitis. CT abdomen and pelvis with contrast-question of the nonocclusive thrombus in the SMV but most significant thrombus not seen in the portal vein including the main portal vein. low-density masslike area along the uncinate process with associated severe ductal dilatation in caliber change.   Howeer she presented again on 02/06/2023 for recurence of GI bleeding (upper)  likely being caused by.  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 She underwent TIPS procedure, aspiration of portal vein thrombus, SMV balloon angioplasty along with embolization of left  gastric vein by IR on 02/09/2023.  Has been placed on full anticoagulation with Lovenox by IR postprocedure on 02/09/2023 x 1 mth then Eliquis.    Patietn returns to [facility] ER today 02/17/2023 with complaints of single bright red blood per rectum at about  2 PM this afternoon. No emsis, no belly pain. Ptient acutally did not have any BM yesterday. And no BM since. No preysncope. BM at 2 PM was initially BRB, then mahroon. Patietn has fatigue.  ER eval notable for vital and lab stability. However rectal exam done by Dr. Jarold Motto confirm mahroon colored stool. Hemocult positive. Medical eval is sought.  Review of Systems: As mentioned in the history of present illness. All other systems reviewed and are negative. Past Medical History:  Diagnosis Date   Anxiety    Arthritis    Depression    Dysrhythmia    hx palpitations greater than 5 yrs -neg echo, stress per pt ? where or dr   GERD (gastroesophageal reflux disease)    occ   Nausea & vomiting 08/24/2021   Pancreatic adenocarcinoma (HCC)    Pancreatic pseudocyst    Port-A-Cath in place 09/15/2021   Pulmonary embolism [facility])    September 2022   Past Surgical History:  Procedure Laterality Date   ANTERIOR LAT LUMBAR FUSION Right 12/16/2015   Procedure: RIGHT LUMBAR TWO-THREE, LUMBAR THREE-FOUR, LUMBAR FOUR-FIVE ANTEROLATERAL LUMBAR INTERBODY FUSION;  Surgeon: Maeola Harman, MD;  [facility]: [facility] OR;  Service: Neurosurgery;  Laterality: Right;   BALLOON DILATION N/A 08/27/2021   Procedure: BALLOON DILATION;  Surgeon: Meridee Score Netty Starring., MD;  Location: [facility]COPY;  Service: Gastroenterology;  Laterality: N/A;   BIOPSY  07/09/2021   Procedure: BIOPSY;  Surgeon: Meridee Score Netty Starring., MD;  Location:  [facility] ENDOSCOPY;  Service: Gastroenterology;;   COLONOSCOPY WITH PROPOFOL N/A 12/29/2022   Procedure: COLONOSCOPY WITH PROPOFOL;  Surgeon: Corbin Ade, MD;  Location: [facility] END[facility];  Service: Endoscopy;  Laterality: N/A;   CYST GASTROSTOMY  08/27/2021    Procedure: CYST GASTROSTOMY;  Surgeon: Meridee Score Netty Starring., MD;  Location: [facility] ENDOSCOPY;  Service: Gastroenterology;;   ESOPHAGOGASTRODUODENOSCOPY N/A 07/09/2021   Procedure: ESOPHAGOGASTRODUODENOSCOPY (EGD);  Surgeon: Lemar Lofty., MD;  Location: [facility] ENDOSCOPY;  Service: Gastroenterology;  Laterality: N/A;   ESOPHAGOGASTRODUODENOSCOPY N/A 02/07/2023   Procedure: ESOPHAGOGASTRODUODENOSCOPY (EGD);  Surgeon: Imogene Burn, MD;  Location: [facility] ENDOSCOPY;  Service: Gastroenterology;  Laterality: N/A;   ESOPHAGOGASTRODUODENOSCOPY (EGD) WITH PROPOFOL N/A 08/27/2021   Procedure: ESOPHAGOGASTRODUODENOSCOPY (EGD) WITH PROPOFOL;  Surgeon: Meridee Score Netty Starring., MD;  Location: [facility] ENDOSCOPY;  Service: Gastroenterology;  Laterality: N/A;   ESOPHAGOGASTRODUODENOSCOPY (EGD) WITH PROPOFOL N/A 10/08/2021   Procedure: ESOPHAGOGASTRODUODENOSCOPY (EGD) WITH PROPOFOL;  Surgeon: Meridee Score Netty Starring., MD;  Location: [facility] ENDOS[facility];  Service: Gastroenterology;  Laterality: N/A;   ESOPHAGOGASTRODUODENOSCOPY (EGD) WITH PROPOFOL N/A 12/29/2022   Procedure: ESOPHAGOGASTRODUODENOSCOPY (EGD) WITH PROPOFOL;  Surgeon: Corbin Ade, MD;  [facility];  Service: Endoscopy;  Laterality: N/A;   ESOPHAGOGASTRODUODENOSCOPY (EGD) WITH PROPOFOL N/A 01/13/2023   Procedure: ESOPHAGOGASTRODUODENOSCOPY (EGD) WITH PROPOFOL;  Surgeon: Franky Macho, MD;  [facility];  Service: Endoscopy;  Laterality: N/A;   ESOPHAGOGASTRODUODENOSCOPY (EGD) WITH PROPOFOL N/A 01/30/2023   Procedure: ESOPHAGOGASTRODUODENOSCOPY (EGD) WITH PROPOFOL;  Surgeon: Lanelle Bal, DO;  [facility];  Service: Endoscopy;  Laterality: N/A;   EUS N/A 07/09/2021   Procedure: UPPER ENDOSCOPIC ULTRASOUND (EUS) RADIAL;  Surgeon: Lemar Lofty., MD;  Location: [facility] ENDOSCOPY;  Service: Gastroenterology;  Laterality: N/A;   EUS N/A 08/27/2021   Procedure: UPPER ENDOSCOPIC ULTRASOUND (EUS) LINEAR;  Surgeon: Lemar Lofty., MD;  Location: [facility] ENDOSCOPY;  Service: Gastroenterology;  Laterality: N/A;   FINE NEEDLE ASPIRATION  07/09/2021   Procedure: FINE NEEDLE ASPIRATION (FNA) LINEAR;  Surgeon: Lemar Lofty., MD;  Location: [facility] ENDOSCOPY;  Service: Gastroenterology;;   HEMOSTASIS CLIP PLACEMENT  12/29/2022   Procedure: HEMOSTASIS CLIP PLACEMENT;  Surgeon: Corbin Ade, MD;  [facility];  Service: Endoscopy;;   HOT HEMOSTASIS  12/29/2022   Procedure: HOT HEMOSTASIS (ARGON PLASMA COAGULATION/BICAP);  Surgeon: Corbin Ade, MD;  [facility];  Service: Endoscopy;;   HOT HEMOSTASIS  01/13/2023   Procedure: HOT HEMOSTASIS (ARGON PLASMA COAGULATION/BICAP);  Surgeon: Franky Macho, MD;  [facility];  Service: Endoscopy;;  used apc and goldprobe   HOT HEMOSTASIS  01/30/2023   Procedure: HOT HEMOSTASIS (ARGON PLASMA COAGULATION/BICAP);  Surgeon: Lanelle Bal, DO;  [facility];  Service: Endoscopy;;   HOT HEMOSTASIS N/A 02/07/2023   Procedure: HOT HEMOSTASIS (ARGON PLASMA COAGULATION/BICAP);  Surgeon: Imogene Burn, MD;  Location: [facility] ENDOSCOPY;  Service: Gastroenterology;  Laterality: N/A;   IR EMBO VENOUS NOT HEMORR HEMANG  INC GUIDE ROADMAPPING  02/09/2023   IR IMAGING GUIDED PORT INSERTION  09/14/2021   IR THROMBECT VENO MECH MOD SED  02/09/2023   IR TIPS  02/09/2023   IR US GUIDE VASC ACCESS RIGHT  02/09/2023   IR US GUIDE VASC ACCESS RIGHT  02/09/2023   LAPAROSCOPY N/A 02/15/2022   Procedure: STAGING DIAGNOSTIC;  Surgeon: Fritzi Mandes, MD;  Location: [facility] OR;  Service: General;  Laterality: N/A;   LUMBAR PERCUTANEOUS PEDICLE SCREW 3 LEVEL Bilateral 12/16/2015  Procedure: PERCUTANEOUS PEDICLE SCREWS BILATERALLY AT LUMBAR TWO-FIVE;  Surgeon: Maeola Harman, MD;  Location: [facility] OR;  Service: Neurosurgery;  Laterality: Bilateral;   PANCREATIC STENT PLACEMENT  08/27/2021   Procedure: PANCREATIC STENT PLACEMENT;  Surgeon: Lemar Lofty., MD;   Location: [facility] ENDOSCOPY;  Service: Gastroenterology;;   POLYPECTOMY  12/29/2022   Procedure: POLYPECTOMY INTESTINAL;  Surgeon: Corbin Ade, MD;  [facility];  Service: Endoscopy;;   STENT REMOVAL  10/08/2021   Procedure: Francine Graven REMOVAL;  Surgeon: Lemar Lofty., MD;  Location: [facility]OSCOPY;  Service: Gastroenterology;;   TIPS PROCEDURE N/A 02/09/2023   Procedure: TRANS-JUGULAR INTRAHEPATIC PORTAL SHUNT (TIPS);  Surgeon: Bennie Dallas, MD;  Location: [facility] OR;  Service: Radiology;  Laterality: N/A;   TUBAL LIGATION  1974   WHIPPLE PROCEDURE N/A 02/15/2022   Procedure: ATTEMPTED WHIPPLE PROCEDURE;  Surgeon: Fritzi Mandes, MD;  Location: [facility] OR;  Service: General;  Laterality: N/A;   Social History:  reports that she has never smoked. She has never used smokeless tobacco. She reports that she does not drink alcohol and does not use drugs.  Allergies  Allergen Reactions   Other Hives and Other (See Comments)    Cherry wood just cut- "smelled it and broke out"; cannot tolerate ANY cherry fragrances, either      Cherry Hives   Wound Dressing Adhesive Rash and Other (See Comments)    Band-Aids = local reaction    Family History  Problem Relation Age of Onset   Pancreatitis Brother        alcoholic pancreatitis   Pancreatic cancer Neg Hx    Colon cancer Neg Hx     Prior to Admission medications   Medication Sig Start Date End Date Taking? Authorizing Provider  acetaminophen (TYLENOL) 500 MG tablet Take 2 tablets (1,000 mg total) by mouth every 8 (eight) hours as needed for moderate pain, headache or fever. Patient taking differently: Take 750-1,000 mg by mouth every 8 (eight) hours as needed for moderate pain (pain score 4-6), headache or fever. 07/21/21  Yes Vassie Loll, MD  buPROPion (WELLBUTRIN XL) 300 MG 24 hr tablet Take 300 mg by mouth daily after breakfast. Takes differently from prescribed: 20mg  BID 06/15/21  Yes [provider]  busPIRone (BUSPAR)  10 MG tablet Take 20 mg by mouth 2 (two) times daily. 01/27/21  Yes [provider]  calcium carbonate (TUMS - DOSED IN MG ELEMENTAL CALCIUM) 500 MG chewable tablet Chew 1 tablet by mouth daily as needed for indigestion or heartburn.   Yes [provider]  Cholecalciferol (VITAMIN D3) 125 MCG (5000 UT) CAPS Take 10,000 Units by mouth every morning.   Yes [provider]  enoxaparin (LOVENOX) 60 MG/0.6ML injection Inject 0.6 mLs (60 mg total) into the skin every 12 (twelve) hours. 02/12/23  Yes Leroy Sea, MD  escitalopram (LEXAPRO) 20 MG tablet Take 20 mg by mouth at bedtime.   Yes [provider]  lactose free nutrition (BOOST) LIQD Take 237 mLs by mouth 2 (two) times daily between meals.   Yes [provider]  LINZESS 72 MCG capsule TAKE 1 CAPSULE BY MOUTH DAILY BEFORE BREAKFAST Patient taking differently: Take 72 mcg by mouth daily before breakfast. 01/20/23  Yes Mansouraty, Netty Starring., MD  mirtazapine (REMERON) 30 MG tablet Take 15 mg by mouth at bedtime.   Yes [provider]  pantoprazole (PROTONIX) 40 MG tablet Take 1 tablet (40 mg total) by mouth 2 (two) times daily. 02/02/23  05/03/23 Yes Carollee Herter, DO  QUEtiapine (SEROQUEL) 25 MG tablet Take 25 mg by mouth 2 (two) times daily. 12/14/22  Yes [provider]  sucralfate (CARAFATE) 1 g tablet Take 1 tablet (1 g total) by mouth 4 (four) times daily -  with meals and at bedtime for 14 days. 02/12/23 02/26/23 Yes Leroy Sea, MD  lidocaine-prilocaine (EMLA) cream Apply a small amount to port a cath site (do not rub in) and cover with plastic wrap 1 hour prior to infusion appointments Patient not taking: Reported on 02/17/2023 09/15/21   Doreatha Massed, MD  ondansetron (ZOFRAN) 4 MG tablet Take 1 tablet (4 mg total) by mouth every 6 (six) hours as needed for nausea or vomiting. Patient not taking: Reported on 02/17/2023 02/02/23 03/04/23  Carollee Herter, DO    Physical Exam: Vitals:    02/17/23 1727 02/17/23 2045  BP: 130/76 121/60  Pulse: (!) 102 82  Resp: 16 16  Temp: 99.1 F (37.3 C) 98.6 F (37 C)  SpO2: 95% 99%   General - AAOx3, hard of hearing chornic. No disres. Thin lady. Resp - b/l a/e veiscular Cvs-s1s2 nromal Abdomen - BS normal/sluggish. Rectal exam in HPI. All quad soft non tender Extremity -warm no edema. Data Reviewed:  Labs on Admission:  Results for orders placed or performed during the hospital encounter of 02/17/23 (from the past 24 hours)  Comprehensive metabolic panel     Status: Abnormal   Collection Time: 02/17/23  5:35 PM  Result Value Ref Range   Sodium 142 135 - 145 mmol/L   Potassium 3.7 3.5 - 5.1 mmol/L   Chloride 110 98 - 111 mmol/L   CO2 22 22 - 32 mmol/L   Glucose, Bld 112 (H) 70 - 99 mg/dL   BUN 21 8 - 23 mg/dL   Creatinine, Ser 4.54 0.44 - 1.00 mg/dL   Calcium 8.8 (L) 8.9 - 10.3 mg/dL   Total Protein 5.7 (L) 6.5 - 8.1 g/dL   Albumin 2.7 (L) 3.5 - 5.0 g/dL   AST 50 (H) 15 - 41 U/L   ALT 52 (H) 0 - 44 U/L   Alkaline Phosphatase 104 38 - 126 U/L   Total Bilirubin 0.4 0.0 - 1.2 mg/dL   GFR, Estimated >09 >81 mL/min   Anion gap 10 5 - 15  CBC     Status: Abnormal   Collection Time: 02/17/23  5:35 PM  Result Value Ref Range   WBC 3.8 (L) 4.0 - 10.5 K/uL   RBC 3.41 (L) 3.87 - 5.11 MIL/uL   Hemoglobin 9.9 (L) 12.0 - 15.0 g/dL   HCT 19.1 (L) 47.8 - 29.5 %   MCV 91.2 80.0 - 100.0 fL   MCH 29.0 26.0 - 34.0 pg   MCHC 31.8 30.0 - 36.0 g/dL   RDW 62.1 (H) 30.8 - 65.7 %   Platelets 200 150 - 400 K/uL   nRBC 0.0 0.0 - 0.2 %  Type and screen  [facility]     Status: None   Collection Time: 02/17/23  5:37 PM  Result Value Ref Range   ABO/RH(D) A POS    Antibody Screen NEG    Sample Expiration      02/20/2023,2359 Performed at [facility], [address], [place], [place] [address]   POC occult blood, ED     Status: Abnormal   Collection Time: 02/17/23  9:06 PM  Result Value Ref Range   Fecal  Occult Bld POSITIVE (A)  NEGATIVE   Basic Metabolic Panel: Recent Labs  Lab 02/11/23 0406 02/12/23 0330 02/13/23 0345 02/17/23 1103 02/17/23 1735  NA 137 137 140 143 142  K 3.7 3.3* 3.7 3.3* 3.7  CL 108 107 111 112* 110  CO2 22 22 22 24 22   GLUCOSE 99 89 104* 108* 112*  BUN 15 11 8 20 21   CREATININE 0.94 0.93 0.95 0.68 0.76  CALCIUM 7.7* 8.0* 8.1* 8.5* 8.8*  MG 2.1 2.1  --  2.2  --    Liver Function Tests: Recent Labs  Lab 02/11/23 0406 02/12/23 0330 02/13/23 0345 02/17/23 1103 02/17/23 1735  AST 48* 31 27 40 50*  ALT 64* 50* 40 46* 52*  ALKPHOS 84 86 77 98 104  BILITOT 0.8 0.6 0.4 0.7 0.4  PROT 4.7* 5.0* 4.7* 5.6* 5.7*  ALBUMIN 2.3* 2.3* 2.2* 2.8* 2.7*   No results for input(s): "LIPASE", "AMYLASE" in the last 168 hours. No results for input(s): "AMMONIA" in the last 168 hours. CBC: Recent Labs  Lab 02/11/23 0406 02/11/23 1336 02/12/23 0330 02/12/23 1216 02/12/23 2113 02/13/23 0345 02/17/23 1103 02/17/23 1735  WBC 5.1   < > 4.2 3.2* 3.6* 2.8* 3.7* 3.8*  NEUTROABS 3.7  --  3.1  --   --  1.8 2.7  --   HGB 8.2*   < > 8.1* 7.9* 9.7* 9.2* 9.6* 9.9*  HCT 25.5*   < > 25.0* 23.8* 28.8* 27.5* 29.4* 31.1*  MCV 89.8   < > 89.0 88.8 87.8 87.9 90.5 91.2  PLT 113*   < > 113* 111* 117* 115* 175 200   < > = values in this interval not displayed.   Cardiac Enzymes: No results for input(s): "CKTOTAL", "CKMB", "CKMBINDEX", "TROPONINIHS" in the last 168 hours.  BNP (last 3 results) No results for input(s): "PROBNP" in the last 8760 hours. CBG: No results for input(s): "GLUCAP" in the last 168 hours.  Radiological Exams on Admission:  No results found.    No intake/output data recorded. No intake/output data recorded.       Assessment and Plan: GI bleed Please see HPI. Recurent issue with UGI bleedingi n the past. However, cuurent episode today seems to be likely lower gi bleeding given BRBPR, normal bun/cr. No vital instability.  Empiric pantoprazole has  been ordered, type and screen has been sent.  I will give gentle hydration overnight.  Trend CBC tomorrow morning. Dr. Marina Goodell from [facility] will see the patient in the morning. We will hold off on the lovenox tonight.  Superior mesenteric vein thrombosis (HCC) See thrmboectomy note from IR on 02/09/2023 in the images section of the chart.   IMPRESSION: 1. 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 2. 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 3. Technically successful aspiration and mechanical thrombectomy of the superior mesenteric and main portal veins. 4. Technically successful plain and drug coated balloon angioplasty of the superior mesenteric and main portal veins. 5. Technically successful coil embolization of the left gastric vein.  At this time, given active bleeding, will skip lovneox tonight. Reconsider same in AM  Malignant neoplasm of pancreas (HCC) Last onc encounter on 02/10/2023 during hospitalization :  Pancreatic adenocarcinoma (T1 N0 M0), stage I - Cytology 07/09/2021 confirmed adenocarcinoma. - PET scan and MRI was done July 2023, showed no evidence of abdominal metastatic disease. - Initiated chemotherapy FOLFOX 08/27/2021 and C2 given FOLFIRINOX on 09/30/2021.  Patient was scheduled to  have a Whipple which was aborted due to extensive scarring.  Xeloda initiated 04/05/2022.    - Elevated CA 19.9 at 226 on 02/01/2023.  Was wnl until 12/10/2022 when increased to 86. - No inpatient chemotherapy planned and no other oncologic workup planned during hospital admission.   Med rec done . Hold linzss at this time given active GI bleeding.   Advance Care Planning:   Code Status: Prior DNR DNI per patient.  Consults: GI as above.  Family Communication: per pateint.  Severity of Illness: The appropriate patient status for this patient is INPATIENT.  Inpatient status is judged to be reasonable and necessary in order to provide the required intensity of service to ensure the patient's safety. The patient's presenting symptoms, physical exam findings, and initial radiographic and laboratory data in the context of their chronic comorbidities is felt to place them at high risk for further clinical deterioration. Furthermore, it is not anticipated that the patient will be medically stable for discharge from the hospital within 2 midnights of admission.   * I certify that at the point of admission it is my clinical judgment that the patient will require inpatient hospital care spanning beyond 2 midnights from the point of admission due to high intensity of service, high risk for further deterioration and high frequency of surveillance required.*  Author: Nolberto Hanlon, MD 02/17/2023 10:20 PM  For on call review [email or web address].

## 2023-02-17 NOTE — Assessment & Plan Note (Signed)
Please see HPI. Recurent issue with UGI bleedingi n the past. However, cuurent episode today seems to be likely lower gi bleeding given BRBPR, normal bun/cr. No vital instability.  Empiric pantoprazole has been ordered, type and screen has been sent.  I will give gentle hydration overnight.  Trend CBC tomorrow morning. Dr. Marina Goodell from [facility] will see the patient in the morning. We will hold off on the lovenox tonight.

## 2023-02-17 NOTE — ED Provider Triage Note (Signed)
Emergency Medicine Provider Triage Evaluation Note  Sheryl Bender , a 78 y.o. female  was evaluated in triage.  Pt complains of bright red blood and melena today.  History of adenocarcinoma of the pancreas, PEs on daily Lovenox she has had multiple GI bleeds.  She had lab work strong this morning before her GI bleeding.  She had an MRI of the abdomen done today she is treated [employer] .  Review of Systems  Positive: Rectal bleedin Negative: Fever  Physical Exam  BP 130/76 (BP Location: Right Arm)   Pulse (!) 102   Temp 99.1 F (37.3 C)   Resp 16   SpO2 95%  Gen:   Awake, no distress   Resp:  Normal effort  MSK:   Moves extremities without difficulty  Other:    Medical Decision Making  Medically screening exam initiated at 5:42 PM.  Appropriate orders placed.  Anike Sheldon was informed that the remainder of the evaluation will be completed by another provider, this initial triage assessment does not replace that evaluation, and the importance of remaining in the ED until their evaluation is complete.  No chest pain shortness of breath or lightheadedness at this time   Arthor Captain, PA-C 02/17/23 1744

## 2023-02-17 NOTE — ED Triage Notes (Signed)
Pt reports one episode of bright red blood in her stool. Pt also reports SHOB of exertion.

## 2023-02-17 NOTE — Telephone Encounter (Signed)
Patient called to inform that she has begun bleeding rectally again today.  Consulted with Ermalinda Memos PA-C who agrees that she needs to go to [facility] ER for evaluation.  Patient made aware and verbalized that she will have her son take her asap.

## 2023-02-17 NOTE — Assessment & Plan Note (Signed)
Last onc encounter on 02/10/2023 during hospitalization :  Pancreatic adenocarcinoma (T1 N0 M0), stage I - Cytology 07/09/2021 confirmed adenocarcinoma. - PET scan and MRI was done July 2023, showed no evidence of abdominal metastatic disease. - Initiated chemotherapy FOLFOX 08/27/2021 and C2 given FOLFIRINOX on 09/30/2021.  Patient was scheduled to have a Whipple which was aborted due to extensive scarring.  Xeloda initiated 04/05/2022.    - Elevated CA 19.9 at 226 on 02/01/2023.  Was wnl until 12/10/2022 when increased to 86. - No inpatient chemotherapy planned and no other oncologic workup planned during hospital admission.

## 2023-02-17 NOTE — Progress Notes (Signed)
Sheryl Bender presented for Portacath access and flush with labs. Portacath located right chest wall accessed with  H 20 needle.  Good blood return present. Portacath flushed with 20ml NS and 500U/53ml Heparin and needle removed intact. No bruising or swelling noted at the site Procedure tolerated well and without incident.   Discharged from clinic ambulatory in stable condition. Alert and oriented x 3. F/U with [facility] as scheduled.

## 2023-02-17 NOTE — Assessment & Plan Note (Signed)
See thrmboectomy note from IR on 02/09/2023 in the images section of the chart.   IMPRESSION: 1. 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 2. 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 3. Technically successful aspiration and mechanical thrombectomy of the superior mesenteric and main portal veins. 4. Technically successful plain and drug coated balloon angioplasty of the superior mesenteric and main portal veins. 5. Technically successful coil embolization of the left gastric vein.  At this time, given active bleeding, will skip lovneox tonight. Reconsider same in AM

## 2023-02-18 ENCOUNTER — Inpatient Hospital Stay (HOSPITAL_COMMUNITY): Payer: Medicare Other | Admitting: Anesthesiology

## 2023-02-18 ENCOUNTER — Inpatient Hospital Stay (HOSPITAL_COMMUNITY): Payer: Medicare Other

## 2023-02-18 ENCOUNTER — Encounter (HOSPITAL_COMMUNITY)
Admission: EM | Disposition: A | Discharge status: Hospice | Payer: Self-pay | Source: Home / Self Care | Attending: Student

## 2023-02-18 ENCOUNTER — Encounter (HOSPITAL_COMMUNITY): Payer: Self-pay | Admitting: Internal Medicine

## 2023-02-18 DIAGNOSIS — K254 Chronic or unspecified gastric ulcer with hemorrhage: Secondary | ICD-10-CM | POA: Diagnosis not present

## 2023-02-18 DIAGNOSIS — K31819 Angiodysplasia of stomach and duodenum without bleeding: Secondary | ICD-10-CM

## 2023-02-18 DIAGNOSIS — K3189 Other diseases of stomach and duodenum: Secondary | ICD-10-CM | POA: Diagnosis not present

## 2023-02-18 DIAGNOSIS — Q393 Congenital stenosis and stricture of esophagus: Secondary | ICD-10-CM | POA: Diagnosis not present

## 2023-02-18 DIAGNOSIS — I10 Essential (primary) hypertension: Secondary | ICD-10-CM

## 2023-02-18 DIAGNOSIS — K449 Diaphragmatic hernia without obstruction or gangrene: Secondary | ICD-10-CM

## 2023-02-18 DIAGNOSIS — K625 Hemorrhage of anus and rectum: Secondary | ICD-10-CM

## 2023-02-18 DIAGNOSIS — K921 Melena: Secondary | ICD-10-CM | POA: Diagnosis not present

## 2023-02-18 HISTORY — PX: HOT HEMOSTASIS: SHX5433

## 2023-02-18 HISTORY — PX: HEMOSTASIS CLIP PLACEMENT: SHX6857

## 2023-02-18 HISTORY — PX: HEMOSTASIS CONTROL: SHX6838

## 2023-02-18 HISTORY — PX: ESOPHAGOGASTRODUODENOSCOPY (EGD) WITH PROPOFOL: SHX5813

## 2023-02-18 LAB — CBC
HCT: 25.9 % — ABNORMAL LOW (ref 36.0–46.0)
Hemoglobin: 8.2 g/dL — ABNORMAL LOW (ref 12.0–15.0)
MCH: 28.9 pg (ref 26.0–34.0)
MCHC: 31.7 g/dL (ref 30.0–36.0)
MCV: 91.2 fL (ref 80.0–100.0)
Platelets: 163 10*3/uL (ref 150–400)
RBC: 2.84 MIL/uL — ABNORMAL LOW (ref 3.87–5.11)
RDW: 17.6 % — ABNORMAL HIGH (ref 11.5–15.5)
WBC: 2.3 10*3/uL — ABNORMAL LOW (ref 4.0–10.5)
nRBC: 0 % (ref 0.0–0.2)

## 2023-02-18 LAB — BASIC METABOLIC PANEL
Anion gap: 7 (ref 5–15)
BUN: 15 mg/dL (ref 8–23)
CO2: 24 mmol/L (ref 22–32)
Calcium: 8.2 mg/dL — ABNORMAL LOW (ref 8.9–10.3)
Chloride: 112 mmol/L — ABNORMAL HIGH (ref 98–111)
Creatinine, Ser: 0.67 mg/dL (ref 0.44–1.00)
GFR, Estimated: >60 mL/min (ref >60)
Glucose, Bld: 125 mg/dL — ABNORMAL HIGH (ref 70–99)
Potassium: 3.2 mmol/L — ABNORMAL LOW (ref 3.5–5.1)
Sodium: 143 mmol/L (ref 135–145)

## 2023-02-18 LAB — HEMOGLOBIN AND HEMATOCRIT, BLOOD
HCT: 18.3 % — ABNORMAL LOW (ref 36.0–46.0)
HCT: 25.5 % — ABNORMAL LOW (ref 36.0–46.0)
HCT: 26.7 % — ABNORMAL LOW (ref 36.0–46.0)
HCT: 26.3 % — ABNORMAL LOW (ref 36.0–46.0)
Hemoglobin: 4.7 g/dL — CL (ref 12.0–15.0)
Hemoglobin: 8.4 g/dL — ABNORMAL LOW (ref 12.0–15.0)
Hemoglobin: 8.5 g/dL — ABNORMAL LOW (ref 12.0–15.0)
Hemoglobin: 8.3 g/dL — ABNORMAL LOW (ref 12.0–15.0)

## 2023-02-18 LAB — PROTIME-INR
INR: 1.1 (ref 0.8–1.2)
Prothrombin Time: 14.3 s (ref 11.4–15.2)

## 2023-02-18 LAB — APTT: aPTT: 29 s (ref 24–36)

## 2023-02-18 SURGERY — ESOPHAGOGASTRODUODENOSCOPY (EGD) WITH PROPOFOL
Anesthesia: Monitor Anesthesia Care

## 2023-02-18 MED ORDER — PROPOFOL 500 MG/50ML IV EMUL
INTRAVENOUS | Status: DC | PRN
Start: 2023-02-18 — End: 2023-02-18
  Administered 2023-02-18: 150 ug/kg/min via INTRAVENOUS
  Administered 2023-02-18: 40 mg via INTRAVENOUS

## 2023-02-18 MED ORDER — PANTOPRAZOLE SODIUM 40 MG IV SOLR
40.0000 mg | Freq: Once | INTRAVENOUS | Status: AC
  Administered 2023-02-18: 40 mg via INTRAVENOUS
  Filled 2023-02-18: qty 10

## 2023-02-18 MED ORDER — LORAZEPAM 2 MG/ML IJ SOLN
0.5000 mg | Freq: Once | INTRAMUSCULAR | Status: DC | PRN
  Filled 2023-02-18: qty 1

## 2023-02-18 MED ORDER — ONDANSETRON HCL 4 MG/2ML IJ SOLN
4.0000 mg | Freq: Four times a day (QID) | INTRAMUSCULAR | Status: DC | PRN
  Administered 2023-02-18 (×2): 4 mg via INTRAVENOUS
  Filled 2023-02-18 (×2): qty 2

## 2023-02-18 MED ORDER — SODIUM CHLORIDE 0.9 % IV SOLN: INTRAVENOUS | Status: DC | PRN

## 2023-02-18 MED ORDER — SODIUM CHLORIDE 0.9% FLUSH
10.0000 mL | Freq: Two times a day (BID) | INTRAVENOUS | Status: DC
  Administered 2023-02-18 – 2023-03-01 (×20): 10 mL

## 2023-02-18 MED ORDER — GLUCAGON HCL RDNA (DIAGNOSTIC) 1 MG IJ SOLR
INTRAMUSCULAR | Status: AC
  Filled 2023-02-18: qty 1

## 2023-02-18 MED ORDER — SODIUM CHLORIDE 0.9% FLUSH
10.0000 mL | INTRAVENOUS | Status: DC | PRN
  Administered 2023-02-18: 10 mL

## 2023-02-18 MED ORDER — IPRATROPIUM-ALBUTEROL 0.5-2.5 (3) MG/3ML IN SOLN
RESPIRATORY_TRACT | Status: AC
  Filled 2023-02-18: qty 3

## 2023-02-18 MED ORDER — PHENYLEPHRINE HCL (PRESSORS) 10 MG/ML IV SOLN
INTRAVENOUS | Status: DC | PRN
  Administered 2023-02-18 (×2): 80 ug via INTRAVENOUS

## 2023-02-18 MED ORDER — IPRATROPIUM-ALBUTEROL 0.5-2.5 (3) MG/3ML IN SOLN
3.0000 mL | Freq: Once | RESPIRATORY_TRACT | Status: AC
  Administered 2023-02-18: 3 mL via RESPIRATORY_TRACT

## 2023-02-18 MED ORDER — GLUCAGON HCL RDNA (DIAGNOSTIC) 1 MG IJ SOLR
INTRAMUSCULAR | Status: DC | PRN
  Administered 2023-02-18 (×2): .25 mg via INTRAVENOUS

## 2023-02-18 MED ORDER — CHLORHEXIDINE GLUCONATE CLOTH 2 % EX PADS
6.0000 | MEDICATED_PAD | Freq: Every day | CUTANEOUS | Status: DC
  Administered 2023-02-19 – 2023-03-01 (×11): 6 via TOPICAL

## 2023-02-18 SURGICAL SUPPLY — 14 items

## 2023-02-18 NOTE — Plan of Care (Signed)

## 2023-02-18 NOTE — Progress Notes (Signed)
TR[facility] night cross cover note:   I was notified by RN that this patient, who underwent EGD today revealing multiple gastric ulcers 2 of which showed evidence of adherent clots, has experienced an episode of vomiting associated with the appearance of old blood clots, in the absence of frank hematemesis.  I have added prn IV Zofran for her at this time.  Additionally, the patient has been on 4 to 6 L nasal cannula throughout the afternoon.  RN conveys that the patient was taken off of supplemental oxygen towards the end of day shift.  Oxygen saturation at the beginning of night shift was in the 80s on room air, prompting reinitiation of 4 L nasal cannula, with improvement in O2 sats into the 90s.  The patient is feeling anxious at this time, but without report of overt shortness of breath.   I have ordered a one-time dose of as needed Ativan for her anxiety, noting a documented history of GAD as an outpatient.  Given her hypoxia and proximity to the episode of nausea/vomiting, have also ordered a chest x-ray to evaluate for any component of aspiration.  Her vital signs otherwise appear stable, including afebrile, with heart rates in the 80s, blood pressure 142/63, and respiratory rate 18-19.    Update: CXR read by radiology as showing evidence of multifocal pneumonia.  Increased risk for aspiration pneumonia given the above events.  She has leukopenia, but no additional SIRS criteria to meet criteria for sepsis at this time.  Will initiate Zosyn, which will include anaerobic coverage for potential aspiration pneumonia and add on procalcitonin level.  I have also added aspiration precautions.    Newton Pigg, DO Hospitalist

## 2023-02-18 NOTE — Progress Notes (Signed)
After pt received breathing tx, sats 92% on 4L [place], spoke with dr Desmond Lope, who states pt is okay to return to floor and encourage incentive spirometer

## 2023-02-18 NOTE — Anesthesia Preprocedure Evaluation (Signed)
Anesthesia Evaluation  Patient identified by MRN, date of birth, ID band Patient awake    Reviewed: Allergy & Precautions, H&P , NPO status , Patient's Chart, lab work & pertinent test results  Airway Mallampati: II  TM Distance: >3 FB Neck ROM: Full    Dental no notable dental hx.    Pulmonary PE (2022, eliquis)   Pulmonary exam normal breath sounds clear to auscultation       Cardiovascular hypertension (140/78 preop, no home meds), Normal cardiovascular exam Rhythm:Regular Rate:Normal  Echo 02/01/23  1. Left ventricular ejection fraction, by estimation, is 55 to 60%. The  left ventricle has normal function. The left ventricle has no regional  wall motion abnormalities. Left ventricular diastolic parameters are  consistent with Grade I diastolic  dysfunction (impaired relaxation).   2. Right ventricular systolic function is normal. The right ventricular  size is normal. Tricuspid regurgitation signal is inadequate for assessing  PA pressure.   3. A small pericardial effusion is present. The pericardial effusion is  anterior to the right ventricle. There is no evidence of cardiac  tamponade.   4. The mitral valve is normal in structure. Trivial mitral valve  regurgitation. No evidence of mitral stenosis.   5. The aortic valve is tricuspid. There is mild calcification of the  aortic valve. Aortic valve regurgitation is trivial. Aortic valve  sclerosis is present, with no evidence of aortic valve stenosis.   6. The inferior vena cava is normal in size with greater than 50%  respiratory variability, suggesting right atrial pressure of 3 mmHg.      Neuro/Psych  PSYCHIATRIC DISORDERS Anxiety Depression    negative neurological ROS     GI/Hepatic ,GERD  Medicated and Controlled,,Pancreatic adenoca Acute GIB, melena    Endo/Other  negative endocrine ROS    Renal/GU negative Renal ROS  negative genitourinary    Musculoskeletal  (+) Arthritis , Osteoarthritis,    Abdominal   Peds negative pediatric ROS (+)  Hematology  (+) Blood dyscrasia, anemia hb 8.4   Anesthesia Other Findings   Reproductive/Obstetrics negative OB ROS                              Anesthesia Physical Anesthesia Plan  ASA: 3  Anesthesia Plan: MAC   Post-op Pain Management:    Induction:   PONV Risk Score and Plan: Propofol infusion, TIVA and Treatment may vary due to age or medical condition  Airway Management Planned: Natural Airway and Simple Face Mask  Additional Equipment: None  Intra-op Plan:   Post-operative Plan:   Informed Consent: I have reviewed the patients History and Physical, chart, labs and discussed the procedure including the risks, benefits and alternatives for the proposed anesthesia with the patient or authorized representative who has indicated his/her understanding and acceptance.     Dental advisory given  Plan Discussed with: CRNA  Anesthesia Plan Comments:          Anesthesia Quick Evaluation

## 2023-02-18 NOTE — Op Note (Signed)
[facility] Patient Name: Sheryl Bender Procedure Date : 02/18/2023 MRN: [id number] Attending MD: Lynann Bologna , MD, [id number] Date of Birth: Jan 14, 1946 CSN: [id number] Age: 78 Admit Type: Inpatient Procedure:                Upper GI endoscopy Indications:              Recurrent UTI bleeding d/t GAVE, s/p recent TIPS                            with left gastric vein embolization-unfortunately                            it is occluded. Pt with EGD with APC 02/07/2023 Providers:                Lynann Bologna, MD, Fransisca Connors, Geoffery Lyons,                            Technician Referring MD:              Medicines:                Monitored Anesthesia Care Complications:            No immediate complications. Estimated Blood Loss:     Estimated blood loss was minimal. Procedure:                Pre-Anesthesia Assessment:                           - Prior to the procedure, a History and Physical                            was performed, and patient medications and                            allergies were reviewed. The patient's tolerance of                            previous anesthesia was also reviewed. The risks                            and benefits of the procedure and the sedation                            options and risks were discussed with the patient.                            All questions were answered, and informed consent                            was obtained. Prior Anticoagulants: The patient has                            taken Lovenox (enoxaparin), last dose was 1 day  prior to procedure. ASA Grade Assessment: III - A                            patient with severe systemic disease. After                            reviewing the risks and benefits, the patient was                            deemed in satisfactory condition to undergo the                            procedure.                           After obtaining  informed consent, the endoscope was                            passed under direct vision. Throughout the                            procedure, the patient's blood pressure, pulse, and                            oxygen saturations were monitored continuously. The                            [id number]-H190 ([id number]) Olympus endoscope was introduced                            through the mouth, and advanced to the second part                            of duodenum. The upper GI endoscopy was                            accomplished without difficulty. The patient                            tolerated the procedure well. Scope In: Scope Out: Findings:      The lower third of the esophagus was mildly tortuous.      A medium-sized hiatal hernia was present.      Many non-bleeding cratered gastric ulcers. 2 with adherent clot were       found in the prepyloric region. The largest lesion was 8 mm in largest       dimension. One hemostatic clip was placed on the ulcer away from       pylorus. For the ulcer closest to pylorus, coagulation using 7FR       monopolar probe was successful. This was followed by application of       purastat to both ulcers. There was no bleeding at the end of the       procedure.      Moderate- severe gastric antral vascular ectasia was present in the       gastric  antrum and in the prepyloric region of the stomach. No fundal       varices.      Diffuse moderately erythematous mucosa without active bleeding and with       no stigmata of bleeding was found in the duodenal bulb and in the first       portion of the duodenum. Impression:               - Multiple gastric ulcers. 2 with adherent clots                            -treated endoscopically.                           - Gastric antral vascular ectasia.                           - Erythematous duodenopathy.                           - Mod hiatal hernia.                           - No specimens collected. Recommendation:            - Return patient to hospital ward for ongoing care.                           - Clear liquid diet.                           - Trend CBC. Keep Hb> 8                           - IV Protonix                           - Carafate elixir 1 g p.o. 4 times daily x 2 weeks                           - IR consultation for occluded TIPS. I have                            discussed with Dr Grace Isaac (IR). Very unfortunate                            situation especially with non-operable pancreatic                            CA, PV thrombosis. IR will review and likely                            attempt thrombectomy early next week. There are                            limited options.                           -  Avoid all nonsteroidals.                           - Hold anticoagulation as risk of bleeding is much                            higher.                           - The findings and recommendations were discussed                            with the patient's family. Procedure Code(s):        --- Professional ---                           [id number], Esophagogastroduodenoscopy, flexible,                            transoral; with control of bleeding, any method Diagnosis Code(s):        --- Professional ---                           Q39.9, Congenital malformation of esophagus,                            unspecified                           K44.9, Diaphragmatic hernia without obstruction or                            gangrene                           K25.4, Chronic or unspecified gastric ulcer with                            hemorrhage                           K31.819, Angiodysplasia of stomach and duodenum                            without bleeding                           K31.89, Other diseases of stomach and duodenum                           K92.1, Melena (includes Hematochezia) CPT copyright 2022 [employer]. All rights reserved. The codes documented in this  report are preliminary and upon coder review may  be revised to meet current compliance requirements. Lynann Bologna, MD 02/18/2023 5:05:33 PM This report has been signed electronically. Number of Addenda: 0

## 2023-02-18 NOTE — Progress Notes (Signed)
PROGRESS NOTE    Sheryl Bender  ZOX:[id number] DOB: [birth year] DOA: 02/17/2023 PCP: [name], DO   Brief Narrative:  78 y.o. 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  She presented again with rectal bleeding.  GI was consulted.  Assessment & Plan:   Recurrent GI bleeding -Patient has had multiple recent hospitalizations for recurrent GI bleeding as discussed above with EGDs and colonoscopy.  Presented with rectal bleeding again.  Hemoglobin currently stable.  Monitor H&H.  Continue IV Protonix.  Also on sucralfate NPO.  Await GI recommendations.  Lovenox on hold.  History of SMV thrombosis -she underwent TIPS procedure, aspiration of portal vein thrombus, SMV balloon angioplasty along with embolization of left gastric vein by IR on 02/09/2023 and was placed on full dose anticoagulation with Lovenox on 02/09/2023 with recommendation to continue it for a month then resume Eliquis  -Anticoagulation to remain on hold till clearance by GI  Malignant neoplasm of pancreas -Patient's  pancreatic cancer was being treated at [facility] by [name], was currently being monitored off of chemotherapy for the last 3 months, she also went to [facility] for a Whipple's procedure but it could not be done due to the location of her tumor. Unfortunately CA 19.9 is now trending up and currently quite high  -Case was recently discussed during last hospitalization by hospitalist with [name] oncologist on 02/09/2023, also discussed her case with her primary oncologist [name] on 02/10/2023: Outpatient follow-up with oncology was advised at that time.  Depression -Continue bupropion, buspirone, escitalopram and mirtazapine  Leukopenia -Mild.  Monitor intermittently.  Goals of care -Patient was recently evaluated by palliative care during recent hospitalization with recommendations for outpatient palliative care follow-up.   DVT prophylaxis: SCDs Code Status: DNR Family Communication: None at bedside Disposition Plan: Status is: Inpatient Remains inpatient appropriate because: Of severity of illness  Consultants: GI  Procedures: None  Antimicrobials: None   Subjective: Patient seen and examined at bedside.  No bleeding since admission.  Feels weak and tired.  No chest pain, shortness of breath or fever reported.  Objective: Vitals:   02/18/23 0630 02/18/23 0800 02/18/23 0830 02/18/23 0900  BP: 115/62 110/60 124/61 (!) 113/54  Pulse: 76 77 85 95  Resp: 17 18 (!) 21 20  Temp: 98.4 F (36.9 C)     TempSrc: Oral     SpO2: 100% 96% 99% 98%    Intake/Output Summary (Last 24 hours) at 02/18/2023 8119 Last data filed at 02/18/2023 0117 Gross per 24 hour  Intake 499.5 ml  Output --  Net 499.5 ml   There were no vitals filed for this visit.  Examination:  General exam: Appears calm and comfortable.  On room air.  Looks chronically ill and  deconditioned. Respiratory system: Bilateral decreased breath sounds at bases Cardiovascular system: S1 & S2 heard,  Rate controlled Gastrointestinal system: Abdomen is nondistended, soft and mildly tender. Normal bowel sounds heard. Extremities: No cyanosis, clubbing, edema  Central nervous system: Alert and oriented.  Slow to respond.  No focal neurological deficits. Moving extremities Skin: No rashes, lesions or ulcers Psychiatry: Flat affect.  Not agitated.    Data Reviewed: I have personally reviewed following labs and imaging studies  CBC: Recent Labs  Lab 02/12/23 0330 02/12/23 1216 02/12/23 2113 02/13/23 0345 02/17/23 1103 02/17/23 1735 02/18/23 0627  WBC 4.2   < > 3.6* 2.8* 3.7* 3.8* 2.3*  NEUTROABS 3.1  --   --  1.8 2.7  --   --   HGB 8.1*   < > 9.7* 9.2* 9.6* 9.9* 8.2*  HCT 25.0*   < > 28.8* 27.5* 29.4* 31.1* 25.9*  MCV 89.0   < > 87.8 87.9 90.5 91.2 91.2  PLT 113*   < > 117* 115* 175 200 163   < > = values in this interval not displayed.   Basic Metabolic Panel: Recent Labs  Lab 02/12/23 0330 02/13/23 0345 02/17/23 1103 02/17/23 1735 02/18/23 0627  NA 137 140 143 142 143  K 3.3* 3.7 3.3* 3.7 3.2*  CL 107 111 112* 110 112*  CO2 22 22 24 22 24   GLUCOSE 89 104* 108* 112* 125*  BUN 11 8 20 21 15   CREATININE 0.93 0.95 0.68 0.76 0.67  CALCIUM 8.0* 8.1* 8.5* 8.8* 8.2*  MG 2.1  --  2.2  --   --    GFR: Estimated Creatinine Clearance: 46.2 mL/min (by C-G formula based on SCr of 0.67 mg/dL). Liver Function Tests: Recent Labs  Lab 02/12/23 0330 02/13/23 0345 02/17/23 1103 02/17/23 1735  AST 31 27 40 50*  ALT 50* 40 46* 52*  ALKPHOS 86 77 98 104  BILITOT 0.6 0.4 0.7 0.4  PROT 5.0* 4.7* 5.6* 5.7*  ALBUMIN 2.3* 2.2* 2.8* 2.7*   No results for input(s): "LIPASE", "AMYLASE" in the last 168 hours. No results for input(s): "AMMONIA" in the last 168 hours. Coagulation Profile: Recent Labs  Lab 02/18/23 0627  INR 1.1   Cardiac Enzymes: No results for input(s): "CKTOTAL", "CKMB", "CKMBINDEX", "TROPONINI" in the last 168 hours. BNP (last 3 results) No results for  input(s): "PROBNP" in the last 8760 hours. HbA1C: No results for input(s): "HGBA1C" in the last 72 hours. CBG: No results for input(s): "GLUCAP" in the last 168 hours. Lipid Profile: No results for input(s): "CHOL", "HDL", "LDLCALC", "TRIG", "CHOLHDL", "LDLDIRECT" in the last 72 hours. Thyroid Function Tests: No results for input(s): "TSH", "T4TOTAL", "FREET4", "T3FREE", "THYROIDAB" in the last 72 hours. Anemia Panel: No results for input(s): "VITAMINB12", "FOLATE", "FERRITIN", "TIBC", "IRON", "RETICCTPCT" in the last 72 hours. Sepsis Labs: No results for input(s): "PROCALCITON", "LATICACIDVEN" in the last 168 hours.  No results found for this or any previous visit (from the past 240 hours).       Radiology Studies: No results found.      Scheduled Meds:  buPROPion  300 mg Oral QPC breakfast   busPIRone  20 mg Oral BID   Chlorhexidine Gluconate Cloth  6 each Topical Daily   cholecalciferol  10,000 Units Oral q morning   escitalopram  20 mg Oral QHS   mirtazapine  15 mg Oral QHS   pantoprazole (PROTONIX) IV  40 mg Intravenous Q12H   QUEtiapine  25 mg Oral BID  sodium chloride flush  10-40 mL Intracatheter Q12H   sodium chloride flush  3 mL Intravenous Q12H   sucralfate  1 g Oral TID WC & HS   Continuous Infusions:  dextrose 5% lactated ringers 100 mL/hr at 02/18/23 0126          Glade Lloyd, MD [facility] 02/18/2023, 9:38 AM

## 2023-02-18 NOTE — Transfer of Care (Signed)
Immediate Anesthesia Transfer of Care Note  [name]  Procedure(s) Performed: ESOPHAGOGASTRODUODENOSCOPY (EGD) WITH PROPOFOL HEMOSTASIS CLIP PLACEMENT HOT HEMOSTASIS (ARGON PLASMA COAGULATION/BICAP) HEMOSTASIS CONTROL  Patient Location: Endoscopy Unit  Anesthesia Type:MAC  Level of Consciousness: awake, alert , and oriented  Airway & Oxygen Therapy: Patient Spontanous Breathing and Patient connected to face mask  Post-op Assessment: Report given to RN, Post -op Vital signs reviewed and stable, Patient moving all extremities, Patient moving all extremities X 4, and Patient able to stick tongue midline  Post vital signs: Reviewed and stable  Last Vitals:  Vitals Value Taken Time  BP 122/68 02/18/23 1635  Temp    Pulse 88 02/18/23 1640  Resp 26 02/18/23 1640  SpO2 90 % 02/18/23 1640  Vitals shown include unfiled device data.  Last Pain:  Vitals:   02/18/23 1506  TempSrc: Temporal  PainSc: 0-No pain         Complications: No notable events documented.

## 2023-02-18 NOTE — Consult Note (Addendum)
Consultation  Referring Provider: [facility]/ Maryjean Ka Primary Care Physician:  [name], DO Primary Gastroenterologist:  Dr.Rourk  Reason for Consultation: Bloody bowel movement  HPI: Sheryl Bender is a 78 y.o. female with complicated recent past medical history.  She has history of stage I pancreatic adenocarcinoma for which she underwent chemotherapy and radiation, prior history of PE for which she had been on Eliquis, iron deficiency anemia, history of interstitial pancreatitis and recurrent GI bleeding.  Patient has had several recent hospitalizations with recurrent GI bleeding. She was admitted 12/2 through 12/30/2022, required transfusions during that admission and underwent EGD and colonoscopy per Dr. Jena Gauss. At EGD she was found to have 2 antral Dula Foy lesions which were treated with gold probe and clipped. Colonoscopy was negative other than a 6 mm polyp which was removed, there was noted to be some old blood in the cecum. Eliquis was resumed post procedures, then she required admission 12 18 through 12 21 with recurrent bleeding and underwent EGD at [facility] with finding of hemorrhagic gastritis, question GAVE and was treated with APC. Eliquis was resumed, and was admitted 1 /4 through 1 /8 again with recurrent bleeding, required transfusions and had EGD that showed local moderate gastritis hemorrhagic and erythema was found in the gastric antrum and prepyloric region question GAVE versus hemorrhagic gastritis actively oozing blood.  Treated with APC.  She was also noted to have 2 nonbleeding superficial gastric ulcers with no stigmata of bleeding in the prepyloric region largest 5 mm mild erythema in the duodenal bulb. 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 Decision was made to treat her with Lovenox twice daily for 1 month, then resume Eliquis.  She was discharged from the hospital here on 02/13/2023  She said after discharge to home she had 1 bowel movement that appeared fairly normal, and following that the stool was dark yesterday she had a maroonish stool early yesterday afternoon and then presented to the emergency room.  She is not having any acute abdominal pain at this point no nausea or vomiting.  She has not had any further stools overnight. Her last dose of Lovenox was yesterday morning. She had also undergone MRI/MRCP which had been ordered by Dr. Bernestine Amass report is pending  On review of labs hemoglobin was 9.2 on 02/13/2023 Yesterday hemoglobin 9.6/BUN 21/creatinine 0.76 AST 50s ALT 52  Today WBC 2.3 and hemoglobin down to 8.2 Not require transfusion thus far.     Past Medical History:  Diagnosis Date   Anxiety    Arthritis    Depression    Dysrhythmia    hx palpitations greater than 5 yrs -neg echo, stress per pt ? where or dr   GERD (gastroesophageal reflux disease)    occ  Nausea & vomiting 08/24/2021   Pancreatic adenocarcinoma (HCC)    Pancreatic pseudocyst    Port-A-Cath in place 09/15/2021   Pulmonary embolism [facility])    September 2022    Past Surgical History:  Procedure Laterality Date    ANTERIOR LAT LUMBAR FUSION Right 12/16/2015   Procedure: RIGHT LUMBAR TWO-THREE, LUMBAR THREE-FOUR, LUMBAR FOUR-FIVE ANTEROLATERAL LUMBAR INTERBODY FUSION;  Surgeon: Maeola Harman, MD;  Location: [facility] OR;  Service: Neurosurgery;  Laterality: Right;   BALLOON DILATION N/A 08/27/2021   Procedure: BALLOON DILATION;  Surgeon: Meridee Score Netty Starring., MD;  Location: [facility] ENDOSCOPY;  Service: Gastroenterology;  Laterality: N/A;   BIOPSY  07/09/2021   Procedure: BIOPSY;  Surgeon: Meridee Score Netty Starring., MD;  Location: [facility] ENDOSCOPY;  Service: Gastroenterology;;   COLONOSCOPY WITH PROPOFOL N/A 12/29/2022   Procedure: COLONOSCOPY WITH PROPOFOL;  Surgeon: Corbin Ade, MD;  Location: [facility] ENDO SUITE;  Service: Endoscopy;  Laterality: N/A;   CYST GASTROSTOMY  08/27/2021   Procedure: CYST GASTROSTOMY;  Surgeon: Meridee Score Netty Starring., MD;  Location: [facility] ENDOSCOPY;  Service: Gastroenterology;;   ESOPHAGOGASTRODUODENOSCOPY N/A 07/09/2021   Procedure: ESOPHAGOGASTRODUODENOSCOPY (EGD);  Surgeon: Lemar Lofty., MD;  Location: [facility] ENDOSCOPY;  Service: Gastroenterology;  Laterality: N/A;   ESOPHAGOGASTRODUODENOSCOPY N/A 02/07/2023   Procedure: ESOPHAGOGASTRODUODENOSCOPY (EGD);  Surgeon: Imogene Burn, MD;  Location: [facility] ENDOSCOPY;  Service: Gastroenterology;  Laterality: N/A;   ESOPHAGOGASTRODUODENOSCOPY (EGD) WITH PROPOFOL N/A 08/27/2021   Procedure: ESOPHAGOGASTRODUODENOSCOPY (EGD) WITH PROPOFOL;  Surgeon: Meridee Score Netty Starring., MD;  Location: [facility] ENDOSCOPY;  Service: Gastroenterology;  Laterality: N/A;   ESOPHAGOGASTRODUODENOSCOPY (EGD) WITH PROPOFOL N/A 10/08/2021   Procedure: ESOPHAGOGASTRODUODENOSCOPY (EGD) WITH PROPOFOL;  Surgeon: Meridee Score Netty Starring., MD;  Location: [facility] ENDOSCOPY;  Service: Gastroenterology;  Laterality: N/A;   ESOPHAGOGASTRODUODENOSCOPY (EGD) WITH PROPOFOL N/A 12/29/2022   Procedure: ESOPHAGOGASTRODUODENOSCOPY (EGD) WITH PROPOFOL;  Surgeon: Corbin Ade, MD;  [facility];   Service: Endoscopy;  Laterality: N/A;   ESOPHAGOGASTRODUODENOSCOPY (EGD) WITH PROPOFOL N/A 01/13/2023   Procedure: ESOPHAGOGASTRODUODENOSCOPY (EGD) WITH PROPOFOL;  Surgeon: Franky Macho, MD;  [facility];  Service: Endoscopy;  Laterality: N/A;   ESOPHAGOGASTRODUODENOSCOPY (EGD) WITH PROPOFOL N/A 01/30/2023   Procedure: ESOPHAGOGASTRODUODENOSCOPY (EGD) WITH PROPOFOL;  Surgeon: Lanelle Bal, DO;  [facility];  Service: Endoscopy;  Laterality: N/A;   EUS N/A 07/09/2021   Procedure: UPPER ENDOSCOPIC ULTRASOUND (EUS) RADIAL;  Surgeon: Lemar Lofty., MD;  Location: [facility] ENDOSCOPY;  Service: Gastroenterology;  Laterality: N/A;   EUS N/A 08/27/2021   Procedure: UPPER ENDOSCOPIC ULTRASOUND (EUS) LINEAR;  Surgeon: Lemar Lofty., MD;  Location: [facility] ENDOSCOPY;  Service: Gastroenterology;  Laterality: N/A;   FINE NEEDLE ASPIRATION  07/09/2021   Procedure: FINE NEEDLE ASPIRATION (FNA) LINEAR;  Surgeon: Lemar Lofty., MD;  Location: [facility] ENDOSCOPY;  Service: Gastroenterology;;   HEMOSTASIS CLIP PLACEMENT  12/29/2022   Procedure: HEMOSTASIS CLIP PLACEMENT;  Surgeon: Corbin Ade, MD;  [facility];  Service: Endoscopy;;   HOT HEMOSTASIS  12/29/2022   Procedure: HOT HEMOSTASIS (ARGON PLASMA COAGULATION/BICAP);  Surgeon: Corbin Ade, MD;  [facility];  Service: Endoscopy;;   HOT HEMOSTASIS  01/13/2023   Procedure: HOT HEMOSTASIS (ARGON PLASMA COAGULATION/BICAP);  Surgeon: Franky Macho, MD;  [facility];  Service: Endoscopy;;  used apc and goldprobe   HOT HEMOSTASIS  01/30/2023   Procedure: HOT HEMOSTASIS (ARGON PLASMA COAGULATION/BICAP);  Surgeon: Lanelle Bal, DO;  [facility];  Service: Endoscopy;;   HOT HEMOSTASIS N/A  02/07/2023   Procedure: HOT HEMOSTASIS (ARGON PLASMA COAGULATION/BICAP);  Surgeon: Imogene Burn, MD;  Location: [facility] ENDOSCOPY;  Service: Gastroenterology;  Laterality: N/A;   IR EMBO  VENOUS NOT HEMORR HEMANG  INC GUIDE ROADMAPPING  02/09/2023   IR IMAGING GUIDED PORT INSERTION  09/14/2021   IR THROMBECT VENO MECH MOD SED  02/09/2023   IR TIPS  02/09/2023   IR US GUIDE VASC ACCESS RIGHT  02/09/2023   IR US GUIDE VASC ACCESS RIGHT  02/09/2023   LAPAROSCOPY N/A 02/15/2022   Procedure: STAGING DIAGNOSTIC;  Surgeon: Fritzi Mandes, MD;  Location: [facility] OR;  Service: General;  Laterality: N/A;   LUMBAR PERCUTANEOUS PEDICLE SCREW 3 LEVEL Bilateral 12/16/2015   Procedure: PERCUTANEOUS PEDICLE SCREWS BILATERALLY AT LUMBAR TWO-FIVE;  Surgeon: Maeola Harman, MD;  Location: [facility] OR;  Service: Neurosurgery;  Laterality: Bilateral;   PANCREATIC STENT PLACEMENT  08/27/2021   Procedure: PANCREATIC STENT PLACEMENT;  Surgeon: Lemar Lofty., MD;  Location: [facility] ENDOSCOPY;  Service: Gastroenterology;;   POLYPECTOMY  12/29/2022   Procedure: POLYPECTOMY INTESTINAL;  Surgeon: Corbin Ade, MD;  [facility];  Service: Endoscopy;;   STENT REMOVAL  10/08/2021   Procedure: Francine Graven REMOVAL;  Surgeon: Lemar Lofty., MD;  Location: [facility] ENDOSCOPY;  Service: Gastroenterology;;   TIPS PROCEDURE N/A 02/09/2023   Procedure: TRANS-JUGULAR INTRAHEPATIC PORTAL SHUNT (TIPS);  Surgeon: Bennie Dallas, MD;  Location: [facility] OR;  Service: Radiology;  Laterality: N/A;   TUBAL LIGATION  1974   WHIPPLE PROCEDURE N/A 02/15/2022   Procedure: ATTEMPTED WHIPPLE PROCEDURE;  Surgeon: Fritzi Mandes, MD;  Location: [facility] OR;  Service: General;  Laterality: N/A;    Prior to Admission medications   Medication Sig Start Date End Date Taking? Authorizing Provider  acetaminophen (TYLENOL) 500 MG tablet Take 2 tablets (1,000 mg total) by mouth every 8 (eight) hours as needed for moderate pain, headache or fever. Patient taking differently: Take 750-1,000 mg by mouth every 8 (eight) hours as needed for moderate pain (pain score 4-6), headache or fever. 07/21/21  Yes Vassie Loll, MD  buPROPion (WELLBUTRIN XL) 300 MG 24  hr tablet Take 300 mg by mouth daily after breakfast. 06/15/21  Yes [provider]  busPIRone (BUSPAR) 10 MG tablet Take 20 mg by mouth 2 (two) times daily. 01/27/21  Yes [provider]  calcium carbonate (TUMS - DOSED IN MG ELEMENTAL CALCIUM) 500 MG chewable tablet Chew 1 tablet by mouth daily as needed for indigestion or heartburn.   Yes [provider]  Cholecalciferol (VITAMIN D3) 125 MCG (5000 UT) CAPS Take 10,000 Units by mouth every morning.   Yes [provider]  enoxaparin (LOVENOX) 60 MG/0.6ML injection Inject 0.6 mLs (60 mg total) into the skin every 12 (twelve) hours. 02/12/23  Yes Leroy Sea, MD  escitalopram (LEXAPRO) 20 MG tablet Take 20 mg by mouth at bedtime.   Yes [provider]  lactose free nutrition (BOOST) LIQD Take 237 mLs by mouth 2 (two) times daily between meals.   Yes [provider]  LINZESS 72 MCG capsule TAKE 1 CAPSULE BY MOUTH DAILY BEFORE BREAKFAST Patient taking differently: Take 72 mcg by mouth daily before breakfast. 01/20/23  Yes Mansouraty, Netty Starring., MD  mirtazapine (REMERON) 30 MG tablet Take 15 mg by mouth at bedtime.   Yes [provider]  pantoprazole (PROTONIX) 40 MG tablet Take 1 tablet (40 mg total) by mouth 2 (two) times daily. 02/02/23 05/03/23 Yes Carollee Herter, DO  QUEtiapine (  SEROQUEL) 25 MG tablet Take 25 mg by mouth 2 (two) times daily. 12/14/22  Yes [provider]  sucralfate (CARAFATE) 1 g tablet Take 1 tablet (1 g total) by mouth 4 (four) times daily -  with meals and at bedtime for 14 days. 02/12/23 02/26/23 Yes Leroy Sea, MD  lidocaine-prilocaine (EMLA) cream Apply a small amount to port a cath site (do not rub in) and cover with plastic wrap 1 hour prior to infusion appointments Patient not taking: Reported on 02/17/2023 09/15/21   Doreatha Massed, MD  ondansetron (ZOFRAN) 4 MG tablet Take 1 tablet (4 mg total) by mouth every 6 (six) hours as needed for nausea or  vomiting. Patient not taking: Reported on 02/17/2023 02/02/23 03/04/23  Carollee Herter, DO    Current Facility-Administered Medications  Medication Dose Route Frequency Provider Last Rate Last Admin   acetaminophen (TYLENOL) tablet 650 mg  650 mg Oral Q6H PRN Nolberto Hanlon, MD   650 mg at 02/17/23 2331   Or   acetaminophen (TYLENOL) suppository 650 mg  650 mg Rectal Q6H PRN Nolberto Hanlon, MD       buPROPion (WELLBUTRIN XL) 24 hr tablet 300 mg  300 mg Oral QPC breakfast Nolberto Hanlon, MD   300 mg at 02/18/23 0916   busPIRone (BUSPAR) tablet 20 mg  20 mg Oral BID Nolberto Hanlon, MD   20 mg at 02/18/23 8657   calcium carbonate (TUMS - dosed in mg elemental calcium) chewable tablet 200 mg of elemental calcium  1 tablet Oral Daily PRN Nolberto Hanlon, MD       Chlorhexidine Gluconate Cloth 2 % PADS 6 each  6 each Topical Daily Nolberto Hanlon, MD       cholecalciferol (VITAMIN D3) 25 MCG (1000 UNIT) tablet 10,000 Units  10,000 Units Oral q morning Nolberto Hanlon, MD   10,000 Units at 02/18/23 0916   dextrose 5 % in lactated ringers infusion   Intravenous Continuous Nolberto Hanlon, MD 100 mL/hr at 02/18/23 0126 New Bag at 02/18/23 0126   escitalopram (LEXAPRO) tablet 20 mg  20 mg Oral QHS Nolberto Hanlon, MD   20 mg at 02/17/23 2331   mirtazapine (REMERON) tablet 15 mg  15 mg Oral Sherene Sires, MD   15 mg at 02/17/23 2331   pantoprazole (PROTONIX) injection 40 mg  40 mg Intravenous Conchita Paris, MD   40 mg at 02/18/23 0915   polyethylene glycol (MIRALAX / GLYCOLAX) packet 17 g  17 g Oral Daily PRN Nolberto Hanlon, MD       QUEtiapine (SEROQUEL) tablet 25 mg  25 mg Oral BID Nolberto Hanlon, MD   25 mg at 02/18/23 0916   sodium chloride flush (NS) 0.9 % injection 10-40 mL  10-40 mL Intracatheter Q12H Nolberto Hanlon, MD   10 mL at 02/18/23 0918   sodium chloride flush (NS) 0.9 % injection 10-40 mL  10-40 mL Intracatheter PRN Nolberto Hanlon, MD   10 mL at 02/18/23 0045   sodium chloride flush (NS) 0.9 % injection 3 mL  3 mL Intravenous Q12H Nolberto Hanlon, MD   3 mL at 02/18/23 0918   sucralfate (CARAFATE) tablet 1 g  1 g Oral TID WC & HS Nolberto Hanlon, MD   1 g at 02/18/23 8469   Current Outpatient Medications  Medication Sig Dispense Refill   acetaminophen (TYLENOL) 500 MG tablet Take 2 tablets (1,000 mg total) by mouth every 8 (eight) hours as needed for moderate pain, headache or fever. (  Patient taking differently: Take 750-1,000 mg by mouth every 8 (eight) hours as needed for moderate pain (pain score 4-6), headache or fever.)     buPROPion (WELLBUTRIN XL) 300 MG 24 hr tablet Take 300 mg by mouth daily after breakfast.     busPIRone (BUSPAR) 10 MG tablet Take 20 mg by mouth 2 (two) times daily.     calcium carbonate (TUMS - DOSED IN MG ELEMENTAL CALCIUM) 500 MG chewable tablet Chew 1 tablet by mouth daily as needed for indigestion or heartburn.     Cholecalciferol (VITAMIN D3) 125 MCG (5000 UT) CAPS Take 10,000 Units by mouth every morning.     enoxaparin (LOVENOX) 60 MG/0.6ML injection Inject 0.6 mLs (60 mg total) into the skin every 12 (twelve) hours. 36 mL 0   escitalopram (LEXAPRO) 20 MG tablet Take 20 mg by mouth at bedtime.     lactose free nutrition (BOOST) LIQD Take 237 mLs by mouth 2 (two) times daily between meals.     LINZESS 72 MCG capsule TAKE 1 CAPSULE BY MOUTH DAILY BEFORE BREAKFAST (Patient taking differently: Take 72 mcg by mouth daily before breakfast.) 30 capsule 0   mirtazapine (REMERON) 30 MG tablet Take 15 mg by mouth at bedtime.     pantoprazole (PROTONIX) 40 MG tablet Take 1 tablet (40 mg total) by mouth 2 (two) times daily. 180 tablet 0   QUEtiapine (SEROQUEL) 25 MG tablet Take 25 mg by mouth 2 (two) times daily.     sucralfate (CARAFATE) 1 g tablet Take 1 tablet (1 g total) by mouth 4 (four) times daily -  with meals and at bedtime for 14 days. 56 tablet 0   lidocaine-prilocaine (EMLA) cream Apply a small amount to port a cath site (do not rub in) and cover with plastic wrap 1 hour prior to infusion appointments  (Patient not taking: Reported on 02/17/2023) 30 g 3   ondansetron (ZOFRAN) 4 MG tablet Take 1 tablet (4 mg total) by mouth every 6 (six) hours as needed for nausea or vomiting. (Patient not taking: Reported on 02/17/2023) 30 tablet 0    Allergies as of 02/17/2023 - Review Complete 02/17/2023  Allergen Reaction Noted   Other Hives and Other (See Comments) 12/08/2015   Cherry Hives 03/30/2021   Wound dressing adhesive Rash and Other (See Comments) 12/08/2015    Family History  Problem Relation Age of Onset   Pancreatitis Brother        alcoholic pancreatitis   Pancreatic cancer Neg Hx    Colon cancer Neg Hx     Social History   Socioeconomic History   Marital status: Widowed    Spouse name: Not on file   Number of children: Not on file   Years of education: Not on file   Highest education level: Not on file  Occupational History   Not on file  Tobacco Use   Smoking status: Never   Smokeless tobacco: Never  Vaping Use   Vaping status: Never Used  Substance and Sexual Activity   Alcohol use: No   Drug use: Never   Sexual activity: Not on file  Other Topics Concern   Not on file  Social History Narrative   Not on file   Social Drivers of Health   Financial Resource Strain: Not on file  Food Insecurity: No Food Insecurity (02/06/2023)   Hunger Vital Sign    Worried About Running Out of Food in the Last Year: Never true    Ran Out of  Food in the Last Year: Never true  Transportation Needs: No Transportation Needs (02/06/2023)   PRAPARE - Administrator, Civil Service (Medical): No    Lack of Transportation (Non-Medical): No  Physical Activity: Not on file  Stress: Not on file  Social Connections: Moderately Integrated (02/06/2023)   Social Connection and Isolation Panel [NHANES]    Frequency of Communication with Friends and Family: More than three times a week    Frequency of Social Gatherings with Friends and Family: Three times a week    Attends Religious  Services: More than 4 times per year    Active Member of Clubs or Organizations: Yes    Attends Banker Meetings: More than 4 times per year    Marital Status: Widowed  Intimate Partner Violence: Not At Risk (02/06/2023)   Humiliation, Afraid, Rape, and Kick questionnaire    Fear of Current or Ex-Partner: No    Emotionally Abused: No    Physically Abused: No    Sexually Abused: No    Review of Systems: Pertinent positive and negative review of systems were noted in the above HPI section.  All other review of systems was otherwise negative.   Physical Exam: Vital signs in last 24 hours: Temp:  [98.1 F (36.7 C)-99.1 F (37.3 C)] 98.4 F (36.9 C) (01/24 0630) Pulse Rate:  [74-103] 74 (01/24 1000) Resp:  [13-21] 18 (01/24 1000) BP: (97-130)/(52-80) 102/52 (01/24 1000) SpO2:  [93 %-100 %] 94 % (01/24 1000)   General:   Alert,  Well-developed, chronically ill-appearing elderly white female, pleasant and cooperative in NAD Head:  Normocephalic and atraumatic. Eyes:  Sclera clear, no icterus.   Conjunctiva pale Ears:  Normal auditory acuity. Nose:  No deformity, discharge,  or lesions. Mouth:  No deformity or lesions.   Neck:  Supple; no masses or thyromegaly. Lungs:  Clear throughout to auscultation.   No wheezes, crackles, or rhonchi.  Heart:  Regular rate and rhythm; no murmurs, clicks, rubs,  or gallops. Abdomen:  Soft, focal tenderness, BS active,nonpalp mass or hsm.  Bowel sounds are present Rectal: Not done, documented maroonish stool per ER MD Msk:  Symmetrical without gross deformities. . Pulses:  Normal pulses noted. Extremities:  Without clubbing or edema. Neurologic:  Alert and  oriented x4;  grossly normal neurologically. Skin:  Intact without significant lesions or rashes.. Psych:  Alert and cooperative. Normal mood and affect.  Intake/Output from previous day: 01/23 0701 - 01/24 0700 In: 499.5 [IV Piggyback:499.5] Out: -  Intake/Output this  shift: No intake/output data recorded.  Lab Results: Recent Labs    02/17/23 1103 02/17/23 1735 02/18/23 0627  WBC 3.7* 3.8* 2.3*  HGB 9.6* 9.9* 8.2*  HCT 29.4* 31.1* 25.9*  PLT 175 200 163   BMET Recent Labs    02/17/23 1103 02/17/23 1735 02/18/23 0627  NA 143 142 143  K 3.3* 3.7 3.2*  CL 112* 110 112*  CO2 24 22 24   GLUCOSE 108* 112* 125*  BUN 20 21 15   CREATININE 0.68 0.76 0.67  CALCIUM 8.5* 8.8* 8.2*   LFT Recent Labs    02/17/23 1735  PROT 5.7*  ALBUMIN 2.7*  AST 50*  ALT 52*  ALKPHOS 104  BILITOT 0.4   PT/INR Recent Labs    02/18/23 0627  LABPROT 14.3  INR 1.1   Hepatitis Panel No results for input(s): "HEPBSAG", "HCVAB", "HEPAIGM", "HEPBIGM" in the last 72 hours.    IMPRESSION:  #4 78 year old white female with  history of stage I pancreatic cancer status postchemotherapy and radiation with currents of pancreatic head/uncinate process mass complicated by main portal vein thrombosis. Patient has had multiple episodes of recurrent acute upper GI bleeding over the past month and a half and has required several EGDs for hemostasis. Initial EGD early December with finding of 2 antral Dula Foy lesions treated with gold probe and clipped Subsequently on EGDs has had hemorrhagic gastritis and question of GAVE, and on EGD on 113 also had 5 nonbleeding cratered gastric ulcers clean-based which may have been secondary to prior APC treatment.  She underwent TIPS on 02/09/2023 and portal thrombectomy of the main portal vein as well as SMV balloon angioplasty and angio embolization of the left gastric vein Eliquis stopped, and she has been on twice daily Lovenox. Initial plan was for Lovenox x 1 month then resume Eliquis.  Patient developed recurrent bleeding yesterday had a black darkish bowel movement the day before then maroonish stool yesterday afternoon, fortunately no further bleeding since. Hemoglobin has dropped from 9.6 yesterday to 8.2 today  Suspect  recurrent bleeding is likely upper GI in etiology choices can go back he may have been secondary to persistent GAVE versus persistent gastric ulcerations  Plan; keep n.p.o. today Continue to hold Lovenox Trend hemoglobin every 6 hours and transfuse as indicated Patient will be scheduled for EGD later this afternoon with Dr. Chales Abrahams.  Procedure was discussed in detail with the patient including indications risks and benefits and she is agreeable to proceed Will also be helpful to see what the MRI/MRCP from yesterday showed (has not resulted yet)  GI will follow with you.   Amy Esterwood PA-C 02/18/2023, 10:39 AM     Attending physician's note   I have taken history, reviewed the chart and examined the patient. I performed a substantive portion of this encounter, including complete performance of at least one of the key components, in conjunction with the APP. I agree with the Advanced Practitioner's note, impression and recommendations.   Recurrent melena on lovenox (last dose 1/23) in pt with recent EGD Jan 13 with APC of GAVE. Hb 8.2 (from 9.6 at D/C). S/P TIPS w/t portal thrombectomy, SMV balloon angioplasty and embolization of left gastric vein.  Eliquis stopped, Lovenox started twice daily.  MRI from yesterday showing TIPS occlusion, PV/SMV occlusion/recurrent thrombosis.  Inoperable HOP 2.8 cm pancreatic AdenoCa s/p chemo/radiation complicated with portal thrombosis.  Plan: -Emergent EGD. -IV Protonix -Trend CBC. Keep Hb> 8 (Hb of 4.7 is a lab error) -IR consultation thereafter. -Unfortunately, not a candidate for [facility] currently until GI bleeding has resolved. This is a very tough situation-balance between bleeding vs thrombosis in setting of pancreatic adenocarcinoma.   Edman Circle, MD [facility] [phone]

## 2023-02-19 DIAGNOSIS — K921 Melena: Secondary | ICD-10-CM | POA: Diagnosis not present

## 2023-02-19 DIAGNOSIS — K31819 Angiodysplasia of stomach and duodenum without bleeding: Secondary | ICD-10-CM | POA: Diagnosis not present

## 2023-02-19 DIAGNOSIS — K625 Hemorrhage of anus and rectum: Secondary | ICD-10-CM | POA: Diagnosis not present

## 2023-02-19 DIAGNOSIS — K254 Chronic or unspecified gastric ulcer with hemorrhage: Secondary | ICD-10-CM | POA: Diagnosis not present

## 2023-02-19 LAB — BASIC METABOLIC PANEL
Anion gap: 4 — ABNORMAL LOW (ref 5–15)
BUN: 13 mg/dL (ref 8–23)
CO2: 24 mmol/L (ref 22–32)
Calcium: 7.9 mg/dL — ABNORMAL LOW (ref 8.9–10.3)
Chloride: 112 mmol/L — ABNORMAL HIGH (ref 98–111)
Creatinine, Ser: 0.82 mg/dL (ref 0.44–1.00)
GFR, Estimated: >60 mL/min (ref >60)
Glucose, Bld: 115 mg/dL — ABNORMAL HIGH (ref 70–99)
Potassium: 3 mmol/L — ABNORMAL LOW (ref 3.5–5.1)
Sodium: 140 mmol/L (ref 135–145)

## 2023-02-19 LAB — CBC WITH DIFFERENTIAL/PLATELET
Abs Immature Granulocytes: 0 10*3/uL (ref 0.00–0.07)
Basophils Absolute: 0 10*3/uL (ref 0.0–0.1)
Basophils Relative: 0 %
Eosinophils Absolute: 0 10*3/uL (ref 0.0–0.5)
Eosinophils Relative: 0 %
HCT: 25.1 % — ABNORMAL LOW (ref 36.0–46.0)
Hemoglobin: 8.2 g/dL — ABNORMAL LOW (ref 12.0–15.0)
Lymphocytes Relative: 5 %
Lymphs Abs: 0.4 10*3/uL — ABNORMAL LOW (ref 0.7–4.0)
MCH: 29.8 pg (ref 26.0–34.0)
MCHC: 32.7 g/dL (ref 30.0–36.0)
MCV: 91.3 fL (ref 80.0–100.0)
Monocytes Absolute: 0.2 10*3/uL (ref 0.1–1.0)
Monocytes Relative: 2 %
Neutro Abs: 8.3 10*3/uL — ABNORMAL HIGH (ref 1.7–7.7)
Neutrophils Relative %: 93 %
Platelets: 149 10*3/uL — ABNORMAL LOW (ref 150–400)
RBC: 2.75 MIL/uL — ABNORMAL LOW (ref 3.87–5.11)
RDW: 17.6 % — ABNORMAL HIGH (ref 11.5–15.5)
WBC: 8.9 10*3/uL (ref 4.0–10.5)
nRBC: 0 % (ref 0.0–0.2)
nRBC: 0 /100{WBCs}

## 2023-02-19 LAB — HEMOGLOBIN AND HEMATOCRIT, BLOOD
HCT: 25.7 % — ABNORMAL LOW (ref 36.0–46.0)
HCT: 24.9 % — ABNORMAL LOW (ref 36.0–46.0)
HCT: 22.3 % — ABNORMAL LOW (ref 36.0–46.0)
Hemoglobin: 8.6 g/dL — ABNORMAL LOW (ref 12.0–15.0)
Hemoglobin: 8.1 g/dL — ABNORMAL LOW (ref 12.0–15.0)
Hemoglobin: 7.3 g/dL — ABNORMAL LOW (ref 12.0–15.0)

## 2023-02-19 LAB — MAGNESIUM: Magnesium: 1.8 mg/dL (ref 1.7–2.4)

## 2023-02-19 LAB — PROCALCITONIN: Procalcitonin: <0.1 ng/mL

## 2023-02-19 MED ORDER — SODIUM CHLORIDE 0.9 % IV SOLN: INTRAVENOUS | Status: DC

## 2023-02-19 MED ORDER — MAGNESIUM SULFATE 2 GM/50ML IV SOLN
2.0000 g | Freq: Once | INTRAVENOUS | Status: AC
  Administered 2023-02-19: 2 g via INTRAVENOUS
  Filled 2023-02-19: qty 50

## 2023-02-19 MED ORDER — PIPERACILLIN-TAZOBACTAM 3.375 G IVPB
3.3750 g | Freq: Three times a day (TID) | INTRAVENOUS | Status: DC
  Administered 2023-02-19 – 2023-02-24 (×16): 3.375 g via INTRAVENOUS
  Filled 2023-02-19 (×16): qty 50

## 2023-02-19 MED ORDER — POTASSIUM CHLORIDE CRYS ER 20 MEQ PO TBCR
40.0000 meq | EXTENDED_RELEASE_TABLET | ORAL | Status: AC
  Administered 2023-02-19 (×2): 40 meq via ORAL
  Filled 2023-02-19 (×2): qty 2

## 2023-02-19 NOTE — Progress Notes (Signed)
Pharmacy Antibiotic Note  Sheryl Bender is a 78 y.o. female admitted on 02/17/2023 .  Pharmacy has been consulted for Zosyn dosing for aspiration PNA. Leukopenic. Renal function ok.   Plan: Zosyn 3.375G IV q8h to be infused over 4 hours   Height: 5' 2.5" (158.8 cm) Weight: 59 kg (130 lb) IBW/kg (Calculated) : 51.25  Temp (24hrs), Avg:98.5 F (36.9 C), Min:97 F (36.1 C), Max:99.8 F (37.7 C)  Recent Labs  Lab 02/12/23 0330 02/12/23 1216 02/12/23 2113 02/13/23 0345 02/17/23 1103 02/17/23 1735 02/18/23 0627  WBC 4.2   < > 3.6* 2.8* 3.7* 3.8* 2.3*  CREATININE 0.93  --   --  0.95 0.68 0.76 0.67   < > = values in this interval not displayed.    Estimated Creatinine Clearance: 46.2 mL/min (by C-G formula based on SCr of 0.67 mg/dL).    Allergies  Allergen Reactions   Other Hives and Other (See Comments)    Cherry wood just cut- "smelled it and broke out"; cannot tolerate ANY cherry fragrances, either      Cherry Hives   Wound Dressing Adhesive Rash and Other (See Comments)    Band-Aids = local reaction    Abran Duke, PharmD, BCPS Clinical Pharmacist Phone: [phone]

## 2023-02-19 NOTE — Progress Notes (Signed)
PROGRESS NOTE    Sheryl Bender  ZOX:[id number] DOB: 05-17-1945 DOA: 02/17/2023 PCP: [name], DO   Brief Narrative:  78 y.o. 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  She presented again with rectal bleeding.  GI was consulted.  Assessment & Plan:   Recurrent GI bleeding -Patient has had multiple recent hospitalizations for recurrent GI bleeding as discussed above with EGDs and colonoscopy.  Presented with rectal bleeding again.  Hemoglobin currently stable.  Monitor H&H.  Continue IV Protonix.  Also on sucralfate NPO.   -Underwent EGD on 02/18/2023: Showed multiple gastric ulcers, 2 with adherent clots, treated endoscopically along with GAVE and erythematous duodenopathy GI recommended to continue IV Protonix along with oral Carafate 4 times a day for [redacted] weeks along with IR consultation for occluded TIPS -Lovenox on hold.  History of SMV thrombosis -she underwent TIPS procedure, aspiration of portal vein thrombus, SMV balloon angioplasty along with embolization of left gastric vein by  IR on 02/09/2023 and was placed on full dose anticoagulation with Lovenox on 02/09/2023 with recommendation to continue it for a month then resume Eliquis  -Anticoagulation to remain on hold till clearance by GI -IR has been reconsulted  Acute respiratory failure with hypoxia Probable aspiration pneumonia -Chest x-ray on 02/18/2023 showed possible multifocal pneumonia.  She was started on IV Zosyn overnight on 02/18/2023. -Currently on 3 L oxygen by nasal cannula.  Wean off as able.  Malignant neoplasm of pancreas -Patient's pancreatic cancer was being treated at [facility] by [name], was currently being monitored off of chemotherapy for the last 3 months, she also went to [facility] for a Whipple's procedure but it could not be done due to the location of her tumor. Unfortunately CA 19.9 is now trending up and currently quite high  -Case was recently discussed during last hospitalization by hospitalist with [name] oncologist on 02/09/2023, also discussed her case with her primary oncologist [name] on 02/10/2023: Outpatient follow-up with oncology was advised at that time.  Depression -Continue bupropion, buspirone, escitalopram and mirtazapine  Leukopenia -Improved  Hypokalemia -Replace.  Repeat a.m. labs  Thrombocytopenia -Mild.  Monitor intermittently  Goals of care -Patient was recently evaluated by palliative care during recent hospitalization with recommendations for outpatient palliative care follow-up.   DVT prophylaxis: SCDs Code Status: DNR Family Communication: None at bedside Disposition Plan: Status is: Inpatient Remains inpatient appropriate because: Of severity of illness  Consultants: GI/IR  Procedures:  EGD on 02/18/2023 Impression:               - Multiple gastric ulcers. 2 with adherent clots                            -  treated endoscopically.                           - Gastric antral vascular ectasia.                           -  Erythematous duodenopathy.                           - Mod hiatal hernia.                           - No specimens collected. Recommendation:           - Return patient to hospital ward for ongoing care.                           - Clear liquid diet.                           - Trend CBC. Keep Hb> 8                           - IV Protonix                           - Carafate elixir 1 g p.o. 4 times daily x 2 weeks                           - IR consultation for occluded TIPS. I have                            discussed with Dr Grace Isaac (IR). Very unfortunate                            situation especially with non-operable pancreatic                            CA, PV thrombosis. IR will review and likely                            attempt thrombectomy early next week. There are                            limited options.                           - Avoid all nonsteroidals.                           - Hold anticoagulation as risk of bleeding is much                            higher.                           - The findings and recommendations were discussed  with the patient's family.  Antimicrobials: Zosyn from 02/18/2023 onwards   Subjective: Patient seen and examined at bedside.  Had a rough night with vomiting and increasing shortness of breath.  Still feels weak and tired.  Denies chest pain, vomiting, abdominal pain. Objective: Vitals:   02/18/23 2119 02/19/23 0044 02/19/23 0430 02/19/23 0559  BP:  (!) 106/53 (!) 107/47   Pulse:  96 93   Resp:  16    Temp:  98.7 F (37.1 C) 99.7 F (37.6 C)   TempSrc:  Oral    SpO2: 97% 95% 93%   Weight:    60 kg  Height:        Intake/Output Summary (Last 24 hours) at 02/19/2023 0750 Last data filed at 02/19/2023 7846 Gross per 24 hour  Intake 1704.71 ml  Output 200 ml  Net 1504.71 ml   Filed Weights   02/18/23 1328 02/18/23 1506 02/19/23 0559  Weight: 35.4 kg 59 kg 60 kg    Examination:  General: On 3  L oxygen via nasal cannula.  No distress.  Chronically ill and deconditioned looking. ENT/neck: No thyromegaly.  JVD is not elevated  respiratory: Decreased breath sounds at bases bilaterally with some crackles; no wheezing  CVS: S1-S2 heard, rate controlled Abdominal: Soft, nontender, slightly distended; no organomegaly, normal bowel sounds are heard Extremities: Trace lower extremity edema; no cyanosis  CNS: Awake and alert.  Slow to respond.  No focal neurologic deficit.  Moves extremities Lymph: No obvious lymphadenopathy Skin: No obvious ecchymosis/lesions  psych: Mostly flat affect.  Currently not agitated.   Musculoskeletal: No obvious joint swelling/deformity     Data Reviewed: I have personally reviewed following labs and imaging studies  CBC: Recent Labs  Lab 02/13/23 0345 02/17/23 1103 02/17/23 1735 02/18/23 0627 02/18/23 1050 02/18/23 1144 02/18/23 1802 02/18/23 2322 02/19/23 0525  WBC 2.8* 3.7* 3.8* 2.3*  --   --   --   --  8.9  NEUTROABS 1.8 2.7  --   --   --   --   --   --  8.3*  HGB 9.2* 9.6* 9.9* 8.2* 4.7* 8.4* 8.5* 8.3* 8.2*  HCT 27.5* 29.4* 31.1* 25.9* 18.3* 25.5* 26.7* 26.3* 25.1*  MCV 87.9 90.5 91.2 91.2  --   --   --   --  91.3  PLT 115* 175 200 163  --   --   --   --  149*   Basic Metabolic Panel: Recent Labs  Lab 02/13/23 0345 02/17/23 1103 02/17/23 1735 02/18/23 0627 02/19/23 0525  NA 140 143 142 143 140  K 3.7 3.3* 3.7 3.2* 3.0*  CL 111 112* 110 112* 112*  CO2 22 24 22 24 24   GLUCOSE 104* 108* 112* 125* 115*  BUN 8 20 21 15 13   CREATININE 0.95 0.68 0.76 0.67 0.82  CALCIUM 8.1* 8.5* 8.8* 8.2* 7.9*  MG  --  2.2  --   --  1.8   GFR: Estimated Creatinine Clearance: 45.1 mL/min (by C-G formula based on SCr of 0.82 mg/dL). Liver Function Tests: Recent Labs  Lab 02/13/23 0345 02/17/23 1103 02/17/23 1735  AST 27 40 50*  ALT 40 46* 52*  ALKPHOS 77 98 104  BILITOT 0.4 0.7 0.4  PROT 4.7* 5.6* 5.7*  ALBUMIN 2.2* 2.8* 2.7*   No results  for input(s): "LIPASE", "AMYLASE" in the last 168 hours. No results for input(s): "AMMONIA" in the last 168 hours. Coagulation Profile: Recent Labs  Lab 02/18/23 0627  INR 1.1  Cardiac Enzymes: No results for input(s): "CKTOTAL", "CKMB", "CKMBINDEX", "TROPONINI" in the last 168 hours. BNP (last 3 results) No results for input(s): "PROBNP" in the last 8760 hours. HbA1C: No results for input(s): "HGBA1C" in the last 72 hours. CBG: No results for input(s): "GLUCAP" in the last 168 hours. Lipid Profile: No results for input(s): "CHOL", "HDL", "LDLCALC", "TRIG", "CHOLHDL", "LDLDIRECT" in the last 72 hours. Thyroid Function Tests: No results for input(s): "TSH", "T4TOTAL", "FREET4", "T3FREE", "THYROIDAB" in the last 72 hours. Anemia Panel: No results for input(s): "VITAMINB12", "FOLATE", "FERRITIN", "TIBC", "IRON", "RETICCTPCT" in the last 72 hours. Sepsis Labs: Recent Labs  Lab 02/18/23 0627  PROCALCITON <0.10    No results found for this or any previous visit (from the past 240 hours).       Radiology Studies: DG Chest Port 1 View Result Date: 02/18/2023 CLINICAL DATA:  Shortness of breath EXAM: PORTABLE CHEST 1 VIEW COMPARISON:  07/13/2021 FINDINGS: Cardiac shadow is stable. Right chest wall port is now seen in satisfactory position. The lungs are well aerated bilaterally. Patchy left perihilar opacity is noted as well as some left retrocardiac opacity most consistent with multifocal pneumonia. Minimal changes in the lateral aspect of the right base are noted as well. No bony abnormality is seen. IMPRESSION: Patchy areas of increased parenchymal opacity bilaterally consistent with multifocal pneumonia. Electronically Signed   By: Alcide Clever M.D.   On: 02/18/2023 21:47   MR Abdomen W Wo Contrast Result Date: 02/18/2023 CLINICAL DATA:  Pancreatic cancer monitoring EXAM: MRI ABDOMEN WITHOUT AND WITH CONTRAST TECHNIQUE: Multiplanar multisequence MR imaging of the abdomen was  performed both before and after the administration of intravenous contrast. CONTRAST:  6mL GADAVIST GADOBUTROL 1 MMOL/ML IV SOLN COMPARISON:  CT abdomen pelvis, 02/06/2023 FINDINGS: Lower chest: Small bilateral pleural effusions. Small hiatal hernia. Hepatobiliary: No suspicious liver lesions. Hepatic signal inversion on in and opposed phase imaging, consistent with hepatic iron deposition. No gallstones, gallbladder wall thickening, or biliary dilatation. Pancreas: No significant change in an ill-defined, infiltrative soft tissue mass of the central pancreatic head measuring 2.8 x 1.9 cm (series 4, image 20). Parenchymal atrophy and ductal dilatation of the distal pancreas, duct measuring up to 1.2 cm (series 4, image 18). Spleen: Normal in size. Accessory spleen in the left upper quadrant (series 3, image 17) Adrenals/Urinary Tract: Adrenal glands are unremarkable. Numerous bilateral parapelvic renal cysts, benign, requiring no further follow-up or characterization. Stomach/Bowel: Stomach is within normal limits. No evidence of bowel wall thickening, distention, or inflammatory changes. Vascular/Lymphatic: Aortic atherosclerosis. 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 No enlarged abdominal lymph nodes. Other: No abdominal wall hernia or abnormality. Small volume ascites, increased compared to prior examination. Musculoskeletal: No acute or significant osseous findings. IMPRESSION: 1. No significant change in an ill-defined, infiltrative soft tissue mass of the central pancreatic head measuring 2.8 x 1.9 cm, consistent with pancreatic adenocarcinoma. 2. 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 3. Small volume ascites, increased compared to prior examination. 4. Small bilateral pleural effusions. 5. Hepatic iron  deposition. Electronically Signed   By: Jearld Lesch M.D.   On: 02/18/2023 14:47   MR 3D Recon At Scanner Result Date: 02/18/2023 CLINICAL DATA:  Pancreatic cancer monitoring EXAM: MRI ABDOMEN WITHOUT AND WITH CONTRAST TECHNIQUE: Multiplanar multisequence MR imaging of the abdomen was performed both before and  after the administration of intravenous contrast. CONTRAST:  6mL GADAVIST GADOBUTROL 1 MMOL/ML IV SOLN COMPARISON:  CT abdomen pelvis, 02/06/2023 FINDINGS: Lower chest: Small bilateral pleural effusions. Small hiatal hernia. Hepatobiliary: No suspicious liver lesions. Hepatic signal inversion on in and opposed phase imaging, consistent with hepatic iron deposition. No gallstones, gallbladder wall thickening, or biliary dilatation. Pancreas: No significant change in an ill-defined, infiltrative soft tissue mass of the central pancreatic head measuring 2.8 x 1.9 cm (series 4, image 20). Parenchymal atrophy and ductal dilatation of the distal pancreas, duct measuring up to 1.2 cm (series 4, image 18). Spleen: Normal in size. Accessory spleen in the left upper quadrant (series 3, image 17) Adrenals/Urinary Tract: Adrenal glands are unremarkable. Numerous bilateral parapelvic renal cysts, benign, requiring no further follow-up or characterization. Stomach/Bowel: Stomach is within normal limits. No evidence of bowel wall thickening, distention, or inflammatory changes. Vascular/Lymphatic: Aortic atherosclerosis. 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 No enlarged abdominal lymph nodes. Other: No abdominal wall hernia or abnormality. Small volume ascites, increased compared to prior examination. Musculoskeletal: No acute or significant osseous findings. IMPRESSION: 1. No significant change in an ill-defined, infiltrative soft tissue mass of the central pancreatic head measuring 2.8 x 1.9 cm,  consistent with pancreatic adenocarcinoma. 2. 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 3. Small volume ascites, increased compared to prior examination. 4. Small bilateral pleural effusions. 5. Hepatic iron deposition. Electronically Signed   By: Jearld Lesch M.D.   On: 02/18/2023 14:47        Scheduled Meds:  buPROPion  300 mg Oral QPC breakfast   busPIRone  20 mg Oral BID   Chlorhexidine Gluconate Cloth  6 each Topical Daily   cholecalciferol  10,000 Units Oral q morning   escitalopram  20 mg Oral QHS   mirtazapine  15 mg Oral QHS   pantoprazole (PROTONIX) IV  40 mg Intravenous Q12H   QUEtiapine  25 mg Oral BID   sodium chloride flush  10-40 mL Intracatheter Q12H   sodium chloride flush  3 mL Intravenous Q12H   sucralfate  1 g Oral TID WC & HS   Continuous Infusions:  piperacillin-tazobactam (ZOSYN)  IV Stopped (02/19/23 0516)          Glade Lloyd, MD [facility] 02/19/2023, 7:50 AM

## 2023-02-19 NOTE — Anesthesia Postprocedure Evaluation (Signed)
Anesthesia Post Note  [name]  Procedure(s) Performed: ESOPHAGOGASTRODUODENOSCOPY (EGD) WITH PROPOFOL HEMOSTASIS CLIP PLACEMENT HOT HEMOSTASIS (ARGON PLASMA COAGULATION/BICAP) HEMOSTASIS CONTROL     Patient location during evaluation: PACU Anesthesia Type: MAC Level of consciousness: awake and alert Pain management: pain level controlled Vital Signs Assessment: post-procedure vital signs reviewed and stable Respiratory status: spontaneous breathing, nonlabored ventilation and respiratory function stable Cardiovascular status: blood pressure returned to baseline and stable Postop Assessment: no apparent nausea or vomiting Anesthetic complications: no   No notable events documented.  Last Vitals:  Vitals:   02/19/23 0044 02/19/23 0430  BP: (!) 106/53 (!) 107/47  Pulse: 96 93  Resp: 16   Temp: 37.1 C 37.6 C  SpO2: 95% 93%    Last Pain:  Vitals:   02/19/23 0044  TempSrc: Oral  PainSc:                  Lannie Fields

## 2023-02-19 NOTE — Consult Note (Signed)
Chief Complaint: Middle hepatic vein TIPS, occluded, as well as occlusion of the portal veins and central superior mesenteric vein.  Referring Provider(s): Amy Esterwood  Supervising Physician: Oley Balm  Patient Status: Sheryl Bender - In-pt  History of Present Illness: Sheryl Bender is a 78 y.o. 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  She was hospitalized from 02/06/2023-02/13/2023 with GI bleeding requiring EGD on 02/07/2023 which showed GAVE treated with APC along with gastric ulcers and gastric antrum.    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  On 02/09/23 Dr. Elby Showers peformed = 1. 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 2. 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 3. Technically successful aspiration and mechanical thrombectomy of the superior mesenteric and main portal veins. 4. Technically successful plain and drug coated balloon angioplasty of the superior mesenteric and main portal veins. 5. Technically successful coil embolization of the left gastric vein.  She was placed on full dose anticoagulation with Lovenox on 02/09/2023 with recommendation to continue it for a month then resume Eliquis and patient was subsequently discharged.    She presented 02/17/23 with GI bleeding.  GI was consulted.   She underwent Endoscopy yesterday which showed =    MRI showed = Middle hepatic vein TIPS, occluded, as well as occlusion of the portal veins and central superior mesenteric vein.  It  appears she unfortunately clotted the TIPS despite being on Lovenox. The thrombosed TIPS and portal vein likely led to recurrent GI bleeding.   IR is asked to evaluate for further recommendations and possibly consider another thrombectomy.  Patient is DNR Limited  Past Medical History:  Diagnosis Date   Anxiety    Arthritis    Depression    Dysrhythmia    hx palpitations greater than 5 yrs -neg echo, stress per pt ? where or dr   GERD (gastroesophageal reflux disease)    occ   Nausea & vomiting 08/24/2021   Pancreatic adenocarcinoma (HCC)    Pancreatic pseudocyst    Port-A-Cath in place 09/15/2021   Pulmonary embolism [facility])    September 2022    Past Surgical History:  Procedure Laterality Date   ANTERIOR LAT LUMBAR FUSION Right 12/16/2015   Procedure: RIGHT LUMBAR TWO-THREE, LUMBAR THREE-FOUR, LUMBAR FOUR-FIVE ANTEROLATERAL LUMBAR INTERBODY FUSION;  Surgeon: Maeola Harman, MD;  Location: [facility] OR;  Service: Neurosurgery;  Laterality: Right;   BALLOON DILATION N/A 08/27/2021   Procedure: BALLOON DILATION;  Surgeon: Meridee Score Netty Starring., MD;  Location: [facility] ENDOSCOPY;  Service: Gastroenterology;  Laterality: N/A;   BIOPSY  07/09/2021   Procedure: BIOPSY;  Surgeon: Meridee Score Netty Starring., MD;  Location: [facility] ENDOSCOPY;  Service: Gastroenterology;;   COLONOSCOPY WITH PROPOFOL N/A 12/29/2022   Procedure: COLONOSCOPY WITH PROPOFOL;  Surgeon: Corbin Ade, MD;  Location: [facility] ENDO SUITE;  Service: Endoscopy;  Laterality: N/A;   CYST GASTROSTOMY  08/27/2021   Procedure: CYST GASTROSTOMY;  Surgeon: Meridee Score Netty Starring., MD;  Location: [facility] ENDOSCOPY;  Service: Gastroenterology;;   ESOPHAGOGASTRODUODENOSCOPY N/A 07/09/2021   Procedure: ESOPHAGOGASTRODUODENOSCOPY (EGD);  Surgeon: Lemar Lofty., MD;  Location: [facility] ENDOSCOPY;  Service: Gastroenterology;  Laterality: N/A;   ESOPHAGOGASTRODUODENOSCOPY N/A 02/07/2023   Procedure: ESOPHAGOGASTRODUODENOSCOPY (EGD);  Surgeon: Imogene Burn, MD;   Location: [facility] ENDOSCOPY;  Service: Gastroenterology;  Laterality: N/A;   ESOPHAGOGASTRODUODENOSCOPY (EGD) WITH PROPOFOL N/A 08/27/2021   Procedure: ESOPHAGOGASTRODUODENOSCOPY (EGD) WITH PROPOFOL;  Surgeon: Meridee Score Netty Starring., MD;  Location: [facility] ENDOSCOPY;  Service: Gastroenterology;  Laterality: N/A;   ESOPHAGOGASTRODUODENOSCOPY (EGD) WITH PROPOFOL N/A 10/08/2021   Procedure: ESOPHAGOGASTRODUODENOSCOPY (EGD) WITH PROPOFOL;  Surgeon: Meridee Score Netty Starring., MD;  Location: [facility] ENDOSCOPY;  Service: Gastroenterology;  Laterality: N/A;   ESOPHAGOGASTRODUODENOSCOPY (EGD) WITH PROPOFOL N/A 12/29/2022   Procedure: ESOPHAGOGASTRODUODENOSCOPY (EGD) WITH PROPOFOL;  Surgeon: Corbin Ade, MD;  [facility]: [facility];  Service: Endoscopy;  Laterality: N/A;   ESOPHAGOGASTRODUODENOSCOPY (EGD) WITH PROPOFOL N/A 01/13/2023   Procedure: ESOPHAGOGASTRODUODENOSCOPY (EGD) WITH PROPOFOL;  Surgeon: Franky Macho, MD;  [facility];  Service: Endoscopy;  Laterality: N/A;   ESOPHAGOGASTRODUODENOSCOPY (EGD) WITH PROPOFOL N/A 01/30/2023   Procedure: ESOPHAGOGASTRODUODENOSCOPY (EGD) WITH PROPOFOL;  Surgeon: Lanelle Bal, DO;  [facility];  Service: Endoscopy;  Laterality: N/A;   EUS N/A 07/09/2021   Procedure: UPPER ENDOSCOPIC ULTRASOUND (EUS) RADIAL;  Surgeon: Lemar Lofty., MD;  Location: [facility] ENDOSCOPY;  Service: Gastroenterology;  Laterality: N/A;   EUS N/A 08/27/2021   Procedure: UPPER ENDOSCOPIC ULTRASOUND (EUS) LINEAR;  Surgeon: Lemar Lofty., MD;  Location: [facility] ENDOSCOPY;  Service: Gastroenterology;  Laterality: N/A;   FINE NEEDLE ASPIRATION  07/09/2021   Procedure: FINE NEEDLE ASPIRATION (FNA) LINEAR;  Surgeon: Lemar Lofty., MD;  Location: [facility] ENDOSCOPY;  Service: Gastroenterology;;   HEMOSTASIS CLIP PLACEMENT  12/29/2022   Procedure: HEMOSTASIS CLIP PLACEMENT;  Surgeon: Corbin Ade, MD;  [facility];  Service: Endoscopy;;   HOT HEMOSTASIS   12/29/2022   Procedure: HOT HEMOSTASIS (ARGON PLASMA COAGULATION/BICAP);  Surgeon: Corbin Ade, MD;  [facility];  Service: Endoscopy;;   HOT HEMOSTASIS  01/13/2023   Procedure: HOT HEMOSTASIS (ARGON PLASMA COAGULATION/BICAP);  Surgeon: Franky Macho, MD;  [facility];  Service: Endoscopy;;  used apc and goldprobe   HOT HEMOSTASIS  01/30/2023   Procedure: HOT HEMOSTASIS (ARGON PLASMA COAGULATION/BICAP);  Surgeon: Lanelle Bal, DO;  [facility];  Service: Endoscopy;;   HOT HEMOSTASIS N/A 02/07/2023   Procedure: HOT HEMOSTASIS (ARGON PLASMA COAGULATION/BICAP);  Surgeon: Imogene Burn, MD;  Location: [facility] ENDOSCOPY;  Service: Gastroenterology;  Laterality: N/A;   IR EMBO VENOUS NOT HEMORR HEMANG  INC GUIDE ROADMAPPING  02/09/2023   IR IMAGING GUIDED PORT INSERTION  09/14/2021   IR THROMBECT VENO MECH MOD SED  02/09/2023   IR TIPS  02/09/2023   IR US GUIDE VASC ACCESS RIGHT  02/09/2023   IR US GUIDE VASC ACCESS RIGHT  02/09/2023   LAPAROSCOPY N/A 02/15/2022   Procedure: STAGING DIAGNOSTIC;  Surgeon: Fritzi Mandes, MD;  Location: [facility] OR;  Service: General;  Laterality: N/A;   LUMBAR PERCUTANEOUS PEDICLE SCREW 3 LEVEL Bilateral 12/16/2015   Procedure: PERCUTANEOUS PEDICLE SCREWS BILATERALLY AT LUMBAR TWO-FIVE;  Surgeon: Maeola Harman, MD;  Location: [facility] OR;  Service: Neurosurgery;  Laterality: Bilateral;   PANCREATIC STENT PLACEMENT  08/27/2021   Procedure: PANCREATIC STENT PLACEMENT;  Surgeon: Lemar Lofty., MD;  Location: [facility] ENDOSCOPY;  Service: Gastroenterology;;   POLYPECTOMY  12/29/2022   Procedure: POLYPECTOMY INTESTINAL;  Surgeon: Corbin Ade, MD;  [facility];  Service: Endoscopy;;   STENT REMOVAL  10/08/2021   Procedure: STENT REMOVAL;  Surgeon: Lemar Lofty., MD;  Location: [facility] ENDOSCOPY;  Service:  Gastroenterology;;   TIPS PROCEDURE N/A 02/09/2023   Procedure: TRANS-JUGULAR INTRAHEPATIC PORTAL SHUNT (TIPS);  Surgeon:  Bennie Dallas, MD;  Location: [facility] OR;  Service: Radiology;  Laterality: N/A;   TUBAL LIGATION  1974   WHIPPLE PROCEDURE N/A 02/15/2022   Procedure: ATTEMPTED WHIPPLE PROCEDURE;  Surgeon: Fritzi Mandes, MD;  Location: [facility] OR;  Service: General;  Laterality: N/A;    Allergies: Other, Cherry, and Wound dressing adhesive  Medications: Prior to Admission medications   Medication Sig Start Date End Date Taking? Authorizing Provider  acetaminophen (TYLENOL) 500 MG tablet Take 2 tablets (1,000 mg total) by mouth every 8 (eight) hours as needed for moderate pain, headache or fever. Patient taking differently: Take 750-1,000 mg by mouth every 8 (eight) hours as needed for moderate pain (pain score 4-6), headache or fever. 07/21/21  Yes Vassie Loll, MD  buPROPion (WELLBUTRIN XL) 300 MG 24 hr tablet Take 300 mg by mouth daily after breakfast. 06/15/21  Yes [provider]  busPIRone (BUSPAR) 10 MG tablet Take 20 mg by mouth 2 (two) times daily. 01/27/21  Yes [provider]  calcium carbonate (TUMS - DOSED IN MG ELEMENTAL CALCIUM) 500 MG chewable tablet Chew 1 tablet by mouth daily as needed for indigestion or heartburn.   Yes [provider]  Cholecalciferol (VITAMIN D3) 125 MCG (5000 UT) CAPS Take 10,000 Units by mouth every morning.   Yes [provider]  enoxaparin (LOVENOX) 60 MG/0.6ML injection Inject 0.6 mLs (60 mg total) into the skin every 12 (twelve) hours. 02/12/23  Yes Leroy Sea, MD  escitalopram (LEXAPRO) 20 MG tablet Take 20 mg by mouth at bedtime.   Yes [provider]  lactose free nutrition (BOOST) LIQD Take 237 mLs by mouth 2 (two) times daily between meals.   Yes [provider]  LINZESS 72 MCG capsule TAKE 1 CAPSULE BY MOUTH DAILY BEFORE BREAKFAST Patient taking differently: Take 72 mcg by mouth daily before breakfast. 01/20/23  Yes Mansouraty, Netty Starring., MD  mirtazapine (REMERON) 30 MG tablet Take 15 mg by mouth at  bedtime.   Yes [provider]  pantoprazole (PROTONIX) 40 MG tablet Take 1 tablet (40 mg total) by mouth 2 (two) times daily. 02/02/23 05/03/23 Yes Carollee Herter, DO  QUEtiapine (SEROQUEL) 25 MG tablet Take 25 mg by mouth 2 (two) times daily. 12/14/22  Yes [provider]  sucralfate (CARAFATE) 1 g tablet Take 1 tablet (1 g total) by mouth 4 (four) times daily -  with meals and at bedtime for 14 days. 02/12/23 02/26/23 Yes Leroy Sea, MD  lidocaine-prilocaine (EMLA) cream Apply a small amount to port a cath site (do not rub in) and cover with plastic wrap 1 hour prior to infusion appointments Patient not taking: Reported on 02/17/2023 09/15/21   Doreatha Massed, MD  ondansetron (ZOFRAN) 4 MG tablet Take 1 tablet (4 mg total) by mouth every 6 (six) hours as needed for nausea or vomiting. Patient not taking: Reported on 02/17/2023 02/02/23 03/04/23  Carollee Herter, DO     Family History  Problem Relation Age of Onset   Pancreatitis Brother        alcoholic pancreatitis   Pancreatic cancer Neg Hx    Colon cancer Neg Hx     Social History   Socioeconomic History   Marital status: Widowed    Spouse name: Not on file   Number of children: Not on file   Years of education: Not on file  Highest education level: Not on file  Occupational History   Not on file  Tobacco Use   Smoking status: Never   Smokeless tobacco: Never  Vaping Use   Vaping status: Never Used  Substance and Sexual Activity   Alcohol use: No   Drug use: Never   Sexual activity: Not on file  Other Topics Concern   Not on file  Social History Narrative   Not on file   Social Drivers of Health   Financial Resource Strain: Not on file  Food Insecurity: No Food Insecurity (02/18/2023)   Hunger Vital Sign    Worried About Running Out of Food in the Last Year: Never true    Ran Out of Food in the Last Year: Never true  Transportation Needs: No Transportation Needs (02/18/2023)   PRAPARE - Therapist, art (Medical): No    Lack of Transportation (Non-Medical): No  Physical Activity: Not on file  Stress: Not on file  Social Connections: Moderately Integrated (02/18/2023)   Social Connection and Isolation Panel [NHANES]    Frequency of Communication with Friends and Family: More than three times a week    Frequency of Social Gatherings with Friends and Family: Three times a week    Attends Religious Services: More than 4 times per year    Active Member of Clubs or Organizations: Yes    Attends Banker Meetings: More than 4 times per year    Marital Status: Widowed     Review of Systems: A 12 point ROS discussed and pertinent positives are indicated in the HPI above.  All other systems are negative.    Vital Signs: BP (!) 94/56   Pulse 99   Temp 99.7 F (37.6 C)   Resp 15   Ht 5' 2.5" (1.588 m)   Wt 132 lb 4.4 oz (60 kg)   SpO2 95%   BMI 23.81 kg/m   Advance Care Plan: The advanced care place/surrogate decision maker was discussed at the time of visit and the patient did not wish to discuss or was not able to name a surrogate decision maker or provide an advance care plan.  Physical Exam Vitals reviewed.  Constitutional:      Appearance: Normal appearance.  Cardiovascular:     Rate and Rhythm: Normal rate.  Pulmonary:     Effort: Pulmonary effort is normal. No respiratory distress.  Neurological:     General: No focal deficit present.     Mental Status: She is alert and oriented to person, place, and time.  Psychiatric:        Mood and Affect: Mood normal.        Behavior: Behavior normal.        Thought Content: Thought content normal.        Judgment: Judgment normal.     Imaging:  CLINICAL DATA:  Pancreatic cancer monitoring   EXAM: MRI ABDOMEN WITHOUT AND WITH CONTRAST   TECHNIQUE: Multiplanar multisequence MR imaging of the abdomen was performed both before and after the administration of intravenous contrast.    CONTRAST:  6mL GADAVIST GADOBUTROL 1 MMOL/ML IV SOLN   COMPARISON:  CT abdomen pelvis, 02/06/2023   FINDINGS: Lower chest: Small bilateral pleural effusions. Small hiatal hernia.   Hepatobiliary: No suspicious liver lesions. Hepatic signal inversion on in and opposed phase imaging, consistent with hepatic iron deposition. No gallstones, gallbladder wall thickening, or biliary dilatation.   Pancreas: No significant change in an ill-defined, infiltrative  soft tissue mass of the central pancreatic head measuring 2.8 x 1.9 cm (series 4, image 20). Parenchymal atrophy and ductal dilatation of the distal pancreas, duct measuring up to 1.2 cm (series 4, image 18).   Spleen: Normal in size. Accessory spleen in the left upper quadrant (series 3, image 17)   Adrenals/Urinary Tract: Adrenal glands are unremarkable. Numerous bilateral parapelvic renal cysts, benign, requiring no further follow-up or characterization.   Stomach/Bowel: Stomach is within normal limits. No evidence of bowel wall thickening, distention, or inflammatory changes.   Vascular/Lymphatic: Aortic atherosclerosis. 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 No enlarged abdominal lymph nodes.   Other: No abdominal wall hernia or abnormality. Small volume ascites, increased compared to prior examination.   Musculoskeletal: No acute or significant osseous findings.   IMPRESSION: 1. No significant change in an ill-defined, infiltrative soft tissue mass of the central pancreatic head measuring 2.8 x 1.9 cm, consistent with pancreatic adenocarcinoma. 2. 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 3. Small volume ascites, increased compared to prior examination. 4. Small bilateral pleural effusions. 5. Hepatic iron deposition.      Electronically Signed   By: Jearld Lesch M.D.   On: 02/18/2023 14:47 Labs:  CBC: Recent Labs    02/17/23 1103 02/17/23 1735 02/18/23 0627 02/18/23 1050 02/18/23 1802 02/18/23 2322 02/19/23 0525 02/19/23 0956  WBC 3.7* 3.8* 2.3*  --   --   --  8.9  --   HGB 9.6* 9.9* 8.2*   < > 8.5* 8.3* 8.2* 8.6*  HCT 29.4* 31.1* 25.9*   < > 26.7* 26.3* 25.1* 25.7*  PLT 175 200 163  --   --   --  149*  --    < > = values in this interval not displayed.    COAGS: Recent Labs    12/27/22 1923 01/29/23 1137 02/09/23 0404 02/18/23 0627  INR 1.1 0.9 1.1 1.1  APTT 58*  --   --  29    BMP: Recent Labs    02/17/23 1103 02/17/23 1735 02/18/23 0627 02/19/23 0525  NA 143 142 143 140  K 3.3* 3.7 3.2* 3.0*  CL 112* 110 112* 112*  CO2 24 22 24 24   GLUCOSE 108* 112* 125* 115*  BUN 20 21 15 13   CALCIUM 8.5* 8.8* 8.2* 7.9*  CREATININE 0.68 0.76 0.67 0.82  GFRNONAA >60 >60 >60 >60    LIVER FUNCTION TESTS: Recent Labs    02/12/23 0330 02/13/23 0345 02/17/23 1103 02/17/23 1735  BILITOT 0.6 0.4 0.7 0.4  AST 31 27 40 50*  ALT 50* 40 46* 52*  ALKPHOS 86 77 98 104  PROT 5.0* 4.7* 5.6* 5.7*  ALBUMIN 2.3* 2.2* 2.8* 2.7*    TUMOR MARKERS: No results for input(s): "AFPTM", "CEA", "CA199", "CHROMGRNA" in the last 8760 hours.  Assessment and Plan:  78 year old with Pancreatic cancer and recurrent GI bleed. 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  GI bleeding again - s/p endoscopy showing ulcerations - very high risk for bleeding so Lovenox held.  Unfortunately she clotted the TIPS despite being on Lovenox.  IR is asked to evaluate for further recommendations  and possibly consider another thrombectomy.  I had another discussion with  the patient and the family members at the bedside.  I explained the findings on the MRI and that she clotted despite anticoagulation.  I explained that she could undergo another IR procedure with thrombectomy however she would probably thrombose again.  Also, she is somewhat hypotensive and she may not be safe for anesthesia.   The family has asked to have some time to discuss it among themselves and they would like to discuss options again with Dr. Elby Showers when he returns next week.   Thank you for allowing our service to participate in Sheryl Bender 's care.  Electronically Signed: Gwynneth Macleod, PA-C   02/19/2023, 11:23 AM      I spent a total of  25 Minutes in face to face in clinical consultation, greater than 50% of which was counseling/coordinating care for consideration for TIPS thrombectomy.

## 2023-02-19 NOTE — Progress Notes (Addendum)
[facility] night cross cover note:  I was notified by RN that the patient conveys this evening that she has not yet urinated following yesterday's EGD.  Ensuing bladder scan showed 781 cc.  She was subsequently able to spontaneously urinate approximately 200 cc's.  She conveys that she does not want a straight cath at this time, and and does not feel the urge to urinate further.   I ordered a repeat bladder scan to occur in approximately 4 hours.    Update: updated bladder scan this AM shows 571 cc's. I placed order for prn straight cath x 1 for pvr bladder scan > 400 cc's.    Newton Pigg, DO Hospitalist

## 2023-02-19 NOTE — Progress Notes (Signed)
Progress Note    ASSESSMENT AND PLAN:   Recurrent UGI bleeding d/t severe GAVE, APC ulcers s/p endo Rx. Occluded recently placed TIPS along with PV/SMV recurrent thrombosis despite recent thrombectomy 02/09/2023 and Lovenox.  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   H/O PE 2022  Plan: -I have discussed with Dr Grace Isaac. Very unfortunate situation especially with non- operable pancreatic CA, PV thrombosis. IR will review and likely attempt rpt thrombectomy early next week after discussion with Dr. Elby Showers.  Extremely high risk for rethrombosis.  There are limited options. -For now trend CBC -Would continue to hold Lovenox for now d/t extremely high risk of bleeding -Continue supportive care.     SUBJECTIVE   Had shortness of breath Chest x-ray showed aspiration pneumonia She is currently on O2 and antibiotics Had nausea/vomiting but no frank hematemesis She had 1 bowel movement without any blood Denies having any abdominal pain currently      OBJECTIVE:     Vital signs in last 24 hours: Temp:  [98.1 F (36.7 C)-99.7 F (37.6 C)] 99.7 F (37.6 C) (01/25 0430) Pulse Rate:  [85-99] 99 (01/25 1110) Resp:  [14-26] 15 (01/25 1110) BP: (94-142)/(47-72) 94/56 (01/25 1110) SpO2:  [89 %-97 %] 95 % (01/25 1110) Weight:  [60 kg] 60 kg (01/25 0559) Last BM Date : 02/18/23 General:   Alert, well-developed female in NAD EENT:  Normal hearing, non icteric sclera, conjunctive pink.  Abdomen:  Soft, nondistended, nontender.  Normal bowel sounds,.       Neurologic:  Alert and  oriented x4;  grossly normal neurologically. Psych:  Pleasant, cooperative.  Normal mood and affect.   Intake/Output from previous day: 01/24 0701 - 01/25 0700 In: 1704.7 [P.O.:60; I.V.:1594.7; IV Piggyback:50] Out: 200 [Urine:200] Intake/Output this shift: Total I/O In: -  Out: 422 [Urine:422]  Lab Results: Recent  Labs    02/17/23 1735 02/18/23 0627 02/18/23 1050 02/19/23 0525 02/19/23 0956 02/19/23 1555  WBC 3.8* 2.3*  --  8.9  --   --   HGB 9.9* 8.2*   < > 8.2* 8.6* 8.1*  HCT 31.1* 25.9*   < > 25.1* 25.7* 24.9*  PLT 200 163  --  149*  --   --    < > = values in this interval not displayed.   BMET Recent Labs    02/17/23 1735 02/18/23 0627 02/19/23 0525  NA 142 143 140  K 3.7 3.2* 3.0*  CL 110 112* 112*  CO2 22 24 24   GLUCOSE 112* 125* 115*  BUN 21 15 13   CREATININE 0.76 0.67 0.82  CALCIUM 8.8* 8.2* 7.9*   LFT Recent Labs    02/17/23 1735  PROT 5.7*  ALBUMIN 2.7*  AST 50*  ALT 52*  ALKPHOS 104  BILITOT 0.4   PT/INR Recent Labs    02/18/23 0627  LABPROT 14.3  INR 1.1   Hepatitis Panel No results for input(s): "HEPBSAG", "HCVAB", "HEPAIGM", "HEPBIGM" in the last 72 hours.  DG Chest Port 1 View Result Date: 02/18/2023 CLINICAL DATA:  Shortness of breath EXAM: PORTABLE CHEST 1 VIEW COMPARISON:  07/13/2021 FINDINGS: Cardiac shadow is stable. Right chest wall port is now seen in satisfactory position. The lungs are well aerated bilaterally. Patchy left perihilar opacity is noted as well as some left retrocardiac opacity most consistent with multifocal pneumonia. Minimal changes in the lateral aspect of the right base are noted as well. No  bony abnormality is seen. IMPRESSION: Patchy areas of increased parenchymal opacity bilaterally consistent with multifocal pneumonia. Electronically Signed   By: Alcide Clever M.D.   On: 02/18/2023 21:47     Principal Problem:   BRBPR (bright red blood per rectum) Active Problems:   Malignant neoplasm of pancreas (HCC)   GI bleed   Superior mesenteric vein thrombosis (HCC)     LOS: 2 days     Edman Circle, MD 02/19/2023, 4:39 PM [facility] [phone]

## 2023-02-19 NOTE — Plan of Care (Signed)
  Problem: Education: Goal: Knowledge of General Education information will improve Description: Including pain rating scale, medication(s)/side effects and non-pharmacologic comfort measures Outcome: Progressing   Problem: Health Behavior/Discharge Planning: Goal: Ability to manage health-related needs will improve Outcome: Progressing   Problem: Clinical Measurements: Goal: Ability to maintain clinical measurements within normal limits will improve Outcome: Progressing Goal: Will remain free from infection Outcome: Progressing Goal: Diagnostic test results will improve Outcome: Progressing Goal: Respiratory complications will improve Outcome: Progressing Goal: Cardiovascular complication will be avoided Outcome: Progressing   Problem: Activity: Goal: Risk for activity intolerance will decrease Outcome: Progressing   Problem: Nutrition: Goal: Adequate nutrition will be maintained Outcome: Progressing   Problem: Coping: Goal: Level of anxiety will decrease Outcome: Progressing   Problem: Elimination: Goal: Will not experience complications related to bowel motility Outcome: Progressing   Problem: Pain Managment: Goal: General experience of comfort will improve and/or be controlled Outcome: Progressing   Problem: Safety: Goal: Ability to remain free from injury will improve Outcome: Progressing   Problem: Skin Integrity: Goal: Risk for impaired skin integrity will decrease Outcome: Progressing   Problem: Elimination: Goal: Will not experience complications related to urinary retention Outcome: Not Progressing

## 2023-02-19 NOTE — Evaluation (Signed)
Physical Therapy Evaluation Patient Details Name: Sheryl Bender MRN: [id number] DOB: Mar 23, 1945 Today's Date: 02/19/2023  History of Present Illness  78 y.o. female admitted 1/23 with Recurrent GI bleeding. S/p EGD with hemostasis clip placement 1/24. 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  hospitalized from 02/06/2023-02/13/2023 with GI bleeding requiring EGD.  Clinical Impression  Pt admitted with above diagnosis. Light min assist for gait, suspect RW will be beneficial next visit but tolerated >150 feet today. SpO2 maintained 96% on 3L. BP improves with activity, up to 115/51 HR 107 after ambulating in hallway. Lower when reclined 95/52 MAP 66 HR 98. Will progress during acute admission. Anticipate okay for HHPT level care at d/c however pending prognosis and decisions family are making regarding continued medical care. Pt currently with functional limitations due to the deficits listed below (see PT Problem List). Pt will benefit from acute skilled PT to increase their independence and safety with mobility to allow discharge.           If plan is discharge home, recommend the following: Assist for transportation;Assistance with cooking/housework;A little help with walking and/or transfers;A little help with bathing/dressing/bathroom;Help with stairs or ramp for entrance   Can travel by private vehicle        Equipment Recommendations None recommended by PT  Recommendations for Other Services       Functional Status Assessment Patient has had a recent decline in their functional status and demonstrates the ability to make significant improvements in function in a reasonable and predictable amount of time.     Precautions / Restrictions Precautions Precautions:  Fall Restrictions Weight Bearing Restrictions Per Provider Order: No      Mobility  Bed Mobility Overal bed mobility: Modified Independent             General bed mobility comments: Extra time, effortful, no physical assist.    Transfers Overall transfer level: Needs assistance Equipment used: None Transfers: Sit to/from Stand Sit to Stand: Supervision           General transfer comment: Supervision for safety, performed from bed x2. Slow to rise, feels unsteady upon standing but no significant sway noted.    Ambulation/Gait Ambulation/Gait assistance: Min assist Gait Distance (Feet): 165 Feet Assistive device: None Gait Pattern/deviations: Step-through pattern, Decreased stride length, Staggering right, Drifts right/left Gait velocity: decr Gait velocity interpretation: 1.31 - 2.62 ft/sec, indicative of limited community ambulator   General Gait Details: Intermittent min assist for balance, showing mild instability, majority of distance at CGA level. Fair pace. Denies dizziness. Educated on safety, awareness.  Stairs            Wheelchair Mobility     Tilt Bed    Modified Rankin (Stroke Patients Only)       Balance Overall balance assessment: Needs assistance Sitting-balance support: No upper extremity supported, Feet supported Sitting balance-Leahy Scale: Good Sitting balance - Comments: sitting EOB   Standing balance support: No upper extremity supported, During functional activity Standing balance-Leahy Scale: Fair                               Pertinent Vitals/Pain Pain Assessment Pain Assessment: 0-10 Pain Score: 4  Pain Descriptors / Indicators: Discomfort, Pressure    Home Living Family/patient expects to be discharged to:: Private residence Living Arrangements: Alone  Available Help at Discharge: Family;Friend(s) Type of Home: Apartment Home Access: Stairs to enter Entrance Stairs-Rails: None Entrance Stairs-Number  of Steps: 1   Home Layout: One level Home Equipment: Agricultural consultant (2 wheels);Shower seat;Cane - single point      Prior Function Prior Level of Function : Independent/Modified Independent             Mobility Comments: Uses cane outside. ADLs Comments: Prior admission "Independent with all ADL's. Occasionally has help to clean." since returning home from prior admission she was able to do most ADLs but was having trouble with getting in the tub to shower.     Extremity/Trunk Assessment   Upper Extremity Assessment Upper Extremity Assessment: Defer to OT evaluation;Right hand dominant    Lower Extremity Assessment Lower Extremity Assessment: Generalized weakness       Communication   Communication Communication: No apparent difficulties Cueing Techniques: Verbal cues  Cognition Arousal: Alert Behavior During Therapy: WFL for tasks assessed/performed Overall Cognitive Status: Within Functional Limits for tasks assessed                                          General Comments General comments (skin integrity, edema, etc.): See orthostatics vital tab. End of session in recliner BP 95/52 MAP 66 HR 98, SpO2 96% on 3L.    Exercises General Exercises - Lower Extremity Ankle Circles/Pumps: AROM, Both, 10 reps, Supine Quad Sets: Strengthening, Both, 10 reps, Supine Gluteal Sets: Strengthening, Both, 10 reps, Seated   Assessment/Plan    PT Assessment Patient needs continued PT services  PT Problem List Decreased strength;Decreased activity tolerance;Decreased balance;Decreased mobility;Decreased knowledge of use of DME;Cardiopulmonary status limiting activity       PT Treatment Interventions DME instruction;Gait training;Functional mobility training;Therapeutic activities;Therapeutic exercise;Balance training;Patient/family education;Stair training;Neuromuscular re-education    PT Goals (Current goals can be found in the Care Plan section)  Acute  Rehab PT Goals Patient Stated Goal: get stronger PT Goal Formulation: With patient Time For Goal Achievement: 03/11/23 Potential to Achieve Goals: Fair    Frequency Min 1X/week     Co-evaluation               AM-PAC PT "6 Clicks" Mobility  Outcome Measure Help needed turning from your back to your side while in a flat bed without using bedrails?: None Help needed moving from lying on your back to sitting on the side of a flat bed without using bedrails?: None Help needed moving to and from a bed to a chair (including a wheelchair)?: A Little Help needed standing up from a chair using your arms (e.g., wheelchair or bedside chair)?: A Little Help needed to walk in hospital room?: A Little Help needed climbing 3-5 steps with a railing? : A Little 6 Click Score: 20    End of Session Equipment Utilized During Treatment: Gait belt;Oxygen Activity Tolerance: Patient tolerated treatment well Patient left: in chair;with call bell/phone within reach;with chair alarm set;with family/visitor present;with SCD's reapplied Nurse Communication: Mobility status (BP) PT Visit Diagnosis: Unsteadiness on feet (R26.81);Muscle weakness (generalized) (M62.81);Other abnormalities of gait and mobility (R26.89)    Time: 1610-9604 PT Time Calculation (min) (ACUTE ONLY): 55 min   Charges:   PT Evaluation $PT Eval Low Complexity: 1 Low PT Treatments $Gait Training: 8-22 mins $Therapeutic Activity: 23-37 mins PT General Charges $$ ACUTE PT VISIT: 1 Visit  Kathlyn Sacramento, PT, DPT [facility]  Rehabilitation Services Physical Therapist Office: [phone] Website: [email or web address]   Berton Mount 02/19/2023, 1:54 PM

## 2023-02-19 NOTE — Progress Notes (Signed)
   02/19/23 0956  Spiritual Encounters  Type of Visit Initial  Care provided to: Patient  Referral source Patient request  Reason for visit Advance directives  OnCall Visit Yes   Chaplain provided AD education and document to patient. Patient was receptive to this and said that her son who lives in [place] will be coming to help her complete the AD.  Pt knows chaplains are available upon request. No further need at this time.

## 2023-02-20 DIAGNOSIS — K254 Chronic or unspecified gastric ulcer with hemorrhage: Secondary | ICD-10-CM | POA: Diagnosis not present

## 2023-02-20 DIAGNOSIS — K31819 Angiodysplasia of stomach and duodenum without bleeding: Secondary | ICD-10-CM | POA: Diagnosis not present

## 2023-02-20 DIAGNOSIS — K625 Hemorrhage of anus and rectum: Secondary | ICD-10-CM | POA: Diagnosis not present

## 2023-02-20 LAB — CBC WITH DIFFERENTIAL/PLATELET
Abs Immature Granulocytes: 0.07 10*3/uL (ref 0.00–0.07)
Basophils Absolute: 0 10*3/uL (ref 0.0–0.1)
Basophils Relative: 0 %
Eosinophils Absolute: 0.1 10*3/uL (ref 0.0–0.5)
Eosinophils Relative: 1 %
HCT: 23.3 % — ABNORMAL LOW (ref 36.0–46.0)
Hemoglobin: 7.3 g/dL — ABNORMAL LOW (ref 12.0–15.0)
Immature Granulocytes: 1 %
Lymphocytes Relative: 5 %
Lymphs Abs: 0.4 10*3/uL — ABNORMAL LOW (ref 0.7–4.0)
MCH: 29.1 pg (ref 26.0–34.0)
MCHC: 31.3 g/dL (ref 30.0–36.0)
MCV: 92.8 fL (ref 80.0–100.0)
Monocytes Absolute: 0.4 10*3/uL (ref 0.1–1.0)
Monocytes Relative: 5 %
Neutro Abs: 6.2 10*3/uL (ref 1.7–7.7)
Neutrophils Relative %: 88 %
Platelets: 150 10*3/uL (ref 150–400)
RBC: 2.51 MIL/uL — ABNORMAL LOW (ref 3.87–5.11)
RDW: 17.7 % — ABNORMAL HIGH (ref 11.5–15.5)
WBC: 7.1 10*3/uL (ref 4.0–10.5)
nRBC: 0 % (ref 0.0–0.2)

## 2023-02-20 LAB — BASIC METABOLIC PANEL
Anion gap: 8 (ref 5–15)
BUN: 10 mg/dL (ref 8–23)
CO2: 21 mmol/L — ABNORMAL LOW (ref 22–32)
Calcium: 7.7 mg/dL — ABNORMAL LOW (ref 8.9–10.3)
Chloride: 113 mmol/L — ABNORMAL HIGH (ref 98–111)
Creatinine, Ser: 0.88 mg/dL (ref 0.44–1.00)
GFR, Estimated: >60 mL/min (ref >60)
Glucose, Bld: 99 mg/dL (ref 70–99)
Potassium: 3.5 mmol/L (ref 3.5–5.1)
Sodium: 142 mmol/L (ref 135–145)

## 2023-02-20 LAB — MAGNESIUM: Magnesium: 2.3 mg/dL (ref 1.7–2.4)

## 2023-02-20 LAB — PROCALCITONIN: Procalcitonin: 7.22 ng/mL

## 2023-02-20 LAB — C-REACTIVE PROTEIN: CRP: 19.6 mg/dL — ABNORMAL HIGH (ref <1.0)

## 2023-02-20 NOTE — Plan of Care (Signed)

## 2023-02-20 NOTE — Progress Notes (Signed)
PROGRESS NOTE    Sheryl Bender  ZOX:[id number] DOB: 1945/03/25 DOA: 02/17/2023 PCP: [name], DO   Brief Narrative:  78 y.o. 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  She presented again with rectal bleeding.  GI was consulted.  She underwent EGD on 02/18/2023.  IR has been also consulted for occluded recently placed TIPS.  Assessment & Plan:   Recurrent GI bleeding -Patient has had multiple recent hospitalizations for recurrent GI bleeding as discussed above with EGDs and colonoscopy.  Presented with rectal bleeding again.  Hemoglobin currently stable.  Monitor H&H.  Hemoglobin 7.3 this morning. -Underwent EGD on 02/18/2023: Showed multiple gastric ulcers, 2 with adherent clots, treated endoscopically along with GAVE and erythematous duodenopathy GI recommended to continue IV Protonix along with oral Carafate 4 times a day for 2 weeks.  Follow further GI recommendations. -Lovenox on hold.  History of SMV thrombosis -she underwent TIPS procedure, aspiration of portal vein thrombus, SMV  balloon angioplasty along with embolization of left gastric vein by IR on 02/09/2023 and was placed on full dose anticoagulation with Lovenox on 02/09/2023 with recommendation to continue it for a month then resume Eliquis  -Anticoagulation to remain on hold till clearance by GI -IR has been reconsulted for occluded recently placed TIPS.  Acute respiratory failure with hypoxia Probable aspiration pneumonia -Chest x-ray on 02/18/2023 showed possible multifocal pneumonia.  She was started on IV Zosyn overnight on 02/18/2023. -Currently on 3 L oxygen by nasal cannula.  Wean off as able.  Malignant neoplasm of pancreas -Patient's pancreatic cancer was being treated at [facility] by [name], was currently being monitored off of chemotherapy for the last 3 months, she also went to [facility] for a Whipple's procedure but it could not be done due to the location of her tumor. Unfortunately CA 19.9 is now trending up and currently quite high  -Case was recently discussed during last hospitalization by hospitalist with [name] oncologist on 02/09/2023, also discussed her case with her primary oncologist [name] on 02/10/2023: Outpatient follow-up with oncology was advised at that time.  Depression -Continue bupropion, buspirone, escitalopram and mirtazapine  Leukopenia -Improved  Hypokalemia -Improved  Thrombocytopenia -Improved  Goals of care -Patient was recently evaluated by palliative care during recent hospitalization with recommendations for outpatient palliative care follow-up.   DVT prophylaxis: SCDs Code Status: DNR Family Communication: None at bedside Disposition Plan: Status is: Inpatient Remains inpatient appropriate because: Of severity of illness  Consultants: GI/IR  Procedures:  EGD on 02/18/2023 Impression:               - Multiple gastric ulcers. 2 with adherent clots                            -  treated endoscopically.                           - Gastric  antral vascular ectasia.                           - Erythematous duodenopathy.                           - Mod hiatal hernia.                           - No specimens collected. Recommendation:           - Return patient to hospital ward for ongoing care.                           - Clear liquid diet.                           - Trend CBC. Keep Hb> 8                           - IV Protonix                           - Carafate elixir 1 g p.o. 4 times daily x 2 weeks                           - IR consultation for occluded TIPS. I have                            discussed with Dr Grace Isaac (IR). Very unfortunate                            situation especially with non-operable pancreatic                            CA, PV thrombosis. IR will review and likely                            attempt thrombectomy early next week. There are                            limited options.                           - Avoid all nonsteroidals.                           - Hold anticoagulation as risk of bleeding is much                            higher.                           - The findings and recommendations were discussed  with the patient's family.  Antimicrobials: Zosyn from 02/18/2023 onwards   Subjective: Patient seen and examined at bedside.  Still short of breath with exertion, intermittent cough.  Continues to feel weak and tired.  No fever, chest pain or vomiting reported.   Objective: Vitals:   02/19/23 2143 02/20/23 0013 02/20/23 0321 02/20/23 0449  BP: (!) 96/46 (!) 108/56 (!) 109/56 (!) 111/55  Pulse: 83 88 89 87  Resp: 18 19 19 18   Temp: 98.5 F (36.9 C) 98 F (36.7 C) 98 F (36.7 C) 98.3 F (36.8 C)  TempSrc: Oral Oral Oral   SpO2: 94% 96% 95% 98%  Weight:   66 kg   Height:        Intake/Output Summary (Last 24 hours) at 02/20/2023 0747 Last data filed at 02/19/2023 1430 Gross per 24 hour  Intake --  Output 422 ml  Net -422 ml   Filed Weights    02/18/23 1506 02/19/23 0559 02/20/23 0321  Weight: 59 kg 60 kg 66 kg    Examination:  General: No distress.  Patient is on 3 L oxygen by nasal cannula chronically ill and deconditioned looking. ENT/neck: No neck masses or JVD elevation noted  respiratory: Bilateral decreased breath sounds at bases with scattered crackles CVS: S1-S2 heard, rate controlled currently Abdominal: Soft, nontender, distended mildly; no organomegaly, bowel sounds are heard Extremities: Trace lower extremity edema; no cyanosis  CNS: Alert and oriented.  Still slow to respond.  No focal neurologic deficit.  Able to move extremities Lymph: No obvious palpable lymphadenopathy Skin: No obvious petechia/rashes psych: Showing no signs of agitation.  Flat affect mostly  Musculoskeletal: No obvious joint tenderness/erythema     Data Reviewed: I have personally reviewed following labs and imaging studies  CBC: Recent Labs  Lab 02/17/23 1103 02/17/23 1735 02/18/23 0627 02/18/23 1050 02/19/23 0525 02/19/23 0956 02/19/23 1555 02/19/23 2247 02/20/23 0420  WBC 3.7* 3.8* 2.3*  --  8.9  --   --   --  7.1  NEUTROABS 2.7  --   --   --  8.3*  --   --   --  6.2  HGB 9.6* 9.9* 8.2*   < > 8.2* 8.6* 8.1* 7.3* 7.3*  HCT 29.4* 31.1* 25.9*   < > 25.1* 25.7* 24.9* 22.3* 23.3*  MCV 90.5 91.2 91.2  --  91.3  --   --   --  92.8  PLT 175 200 163  --  149*  --   --   --  150   < > = values in this interval not displayed.   Basic Metabolic Panel: Recent Labs  Lab 02/17/23 1103 02/17/23 1735 02/18/23 0627 02/19/23 0525 02/20/23 0420  NA 143 142 143 140 142  K 3.3* 3.7 3.2* 3.0* 3.5  CL 112* 110 112* 112* 113*  CO2 24 22 24 24  21*  GLUCOSE 108* 112* 125* 115* 99  BUN 20 21 15 13 10   CREATININE 0.68 0.76 0.67 0.82 0.88  CALCIUM 8.5* 8.8* 8.2* 7.9* 7.7*  MG 2.2  --   --  1.8 2.3   GFR: Estimated Creatinine Clearance: 46.8 mL/min (by C-G formula based on SCr of 0.88 mg/dL). Liver Function Tests: Recent Labs  Lab  02/17/23 1103 02/17/23 1735  AST 40 50*  ALT 46* 52*  ALKPHOS 98 104  BILITOT 0.7 0.4  PROT 5.6* 5.7*  ALBUMIN 2.8* 2.7*   No results for input(s): "LIPASE", "AMYLASE" in the last 168 hours. No results for input(s): "  AMMONIA" in the last 168 hours. Coagulation Profile: Recent Labs  Lab 02/18/23 0627  INR 1.1   Cardiac Enzymes: No results for input(s): "CKTOTAL", "CKMB", "CKMBINDEX", "TROPONINI" in the last 168 hours. BNP (last 3 results) No results for input(s): "PROBNP" in the last 8760 hours. HbA1C: No results for input(s): "HGBA1C" in the last 72 hours. CBG: No results for input(s): "GLUCAP" in the last 168 hours. Lipid Profile: No results for input(s): "CHOL", "HDL", "LDLCALC", "TRIG", "CHOLHDL", "LDLDIRECT" in the last 72 hours. Thyroid Function Tests: No results for input(s): "TSH", "T4TOTAL", "FREET4", "T3FREE", "THYROIDAB" in the last 72 hours. Anemia Panel: No results for input(s): "VITAMINB12", "FOLATE", "FERRITIN", "TIBC", "IRON", "RETICCTPCT" in the last 72 hours. Sepsis Labs: Recent Labs  Lab 02/18/23 0627 02/20/23 0420  PROCALCITON <0.10 7.22    No results found for this or any previous visit (from the past 240 hours).       Radiology Studies: DG Chest Port 1 View Result Date: 02/18/2023 CLINICAL DATA:  Shortness of breath EXAM: PORTABLE CHEST 1 VIEW COMPARISON:  07/13/2021 FINDINGS: Cardiac shadow is stable. Right chest wall port is now seen in satisfactory position. The lungs are well aerated bilaterally. Patchy left perihilar opacity is noted as well as some left retrocardiac opacity most consistent with multifocal pneumonia. Minimal changes in the lateral aspect of the right base are noted as well. No bony abnormality is seen. IMPRESSION: Patchy areas of increased parenchymal opacity bilaterally consistent with multifocal pneumonia. Electronically Signed   By: Alcide Clever M.D.   On: 02/18/2023 21:47        Scheduled Meds:  buPROPion  300 mg  Oral QPC breakfast   busPIRone  20 mg Oral BID   Chlorhexidine Gluconate Cloth  6 each Topical Daily   cholecalciferol  10,000 Units Oral q morning   escitalopram  20 mg Oral QHS   mirtazapine  15 mg Oral QHS   pantoprazole (PROTONIX) IV  40 mg Intravenous Q12H   QUEtiapine  25 mg Oral BID   sodium chloride flush  10-40 mL Intracatheter Q12H   sodium chloride flush  3 mL Intravenous Q12H   sucralfate  1 g Oral TID WC & HS   Continuous Infusions:  sodium chloride 75 mL/hr at 02/19/23 2155   piperacillin-tazobactam (ZOSYN)  IV 3.375 g (02/20/23 0272)          Glade Lloyd, MD [facility] 02/20/2023, 7:47 AM

## 2023-02-20 NOTE — Progress Notes (Addendum)
Patient ID: Sheryl Bender, female   DOB: [birth year], 78 y.o.   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 status post TIPS and SMV thrombectomy 02/09/2023  Lovenox on hold  MRI abdomen-no change in ill-defined infiltrative soft tissue mass in the central pancreatic head 2.8 x 1.9 cm, TIPS occluded as well as occlusion of the portal veins and central SMV  EGD 02/18/2023-multiple cratered gastric ulcers 2 with adherent clot in the prepylorus largest 8 mm clip laced on the ulcer furthest from the pylorus the 1 closer to the pylorus was treated with coagulation for noted severe GAVE fundal varices  Labs today WBC 7.1/hemoglobin 7.3/hematocrit 23.3 stable BUN 10/creatinine 0.88 CRP 19.6 Procalcitonin normal  Patient sitting up in chair, says she feels bloated and full, has only been on clear liquids thus far but would like to try to advance diet No bowel movement today, only had a very small dark stool yesterday No complaints of shortness of breath, O2 2 L Says she is having a hard time right now trying to decide how to move forward   Objective   Vital signs in last 24 hours: Temp:  [98 F (36.7 C)-98.5 F (36.9 C)] 98.2 F (36.8 C) (01/26 0844) Pulse Rate:  [83-90] 90 (01/26 0844) Resp:  [16-19] 18 (01/26 0449) BP: (94-111)/(46-60) 103/60 (01/26 0844) SpO2:  [93 %-98 %] 93 % (01/26 0844) Weight:  [66 kg] 66 kg (01/26 0321) Last BM Date : 02/20/23 General: Elderly white female in NAD Heart:  Regular rate and rhythm; no murmurs Lungs: Respirations even and unlabored, lungs CTA bilaterally Abdomen:  Soft, full feeling, probable ascites normal bowel sounds. Extremities:  Without edema. Neurologic:  Alert and oriented,  grossly normal neurologically. Psych:  Cooperative. Normal mood and affect.  Intake/Output from previous day: 01/25 0701 - 01/26 0700 In: -  Out: 422  [Urine:422] Intake/Output this shift: Total I/O In: 3 [I.V.:3] Out: 276 [Urine:275; Stool:1]  Lab Results: Recent Labs    02/18/23 0627 02/18/23 1050 02/19/23 0525 02/19/23 0956 02/19/23 1555 02/19/23 2247 02/20/23 0420  WBC 2.3*  --  8.9  --   --   --  7.1  HGB 8.2*   < > 8.2*   < > 8.1* 7.3* 7.3*  HCT 25.9*   < > 25.1*   < > 24.9* 22.3* 23.3*  PLT 163  --  149*  --   --   --  150   < > = values in this interval not displayed.   BMET Recent Labs    02/18/23 0627 02/19/23 0525 02/20/23 0420  NA 143 140 142  K 3.2* 3.0* 3.5  CL 112* 112* 113*  CO2 24 24 21*  GLUCOSE 125* 115* 99  BUN 15 13 10   CREATININE 0.67 0.82 0.88  CALCIUM 8.2* 7.9* 7.7*   LFT Recent Labs    02/17/23 1735  PROT 5.7*  ALBUMIN 2.7*  AST 50*  ALT 52*  ALKPHOS 104  BILITOT 0.4   PT/INR Recent Labs    02/18/23 0627  LABPROT 14.3  INR 1.1    Studies/Results: DG Chest Port 1 View Result Date: 02/18/2023 CLINICAL DATA:  Shortness of breath EXAM: PORTABLE CHEST 1 VIEW COMPARISON:  07/13/2021 FINDINGS: Cardiac shadow is stable. Right chest wall port is now seen in satisfactory position. The lungs are well aerated bilaterally. Patchy left perihilar opacity is noted as well  as some left retrocardiac opacity most consistent with multifocal pneumonia. Minimal changes in the lateral aspect of the right base are noted as well. No bony abnormality is seen. IMPRESSION: Patchy areas of increased parenchymal opacity bilaterally consistent with multifocal pneumonia. Electronically Signed   By: Alcide Clever M.D.   On: 02/18/2023 21:47       Assessment / Plan:    #28 78 year old female with pancreatic adenocarcinoma initial diagnosis 2022 status post chemotherapy radiation with history of PE/DVT on Eliquis with recent history of recurrent GI bleeding with multiple endoscopic procedures over the past month.  She initially been at [facility]. 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 EGD  on 02/07/2023 showed 5 nonbleeding cratered gastric ulcers clean-based likely secondary to prior APC treatment  She underwent TIPS on 02/09/2023 and portal thrombectomy of the main portal vein as well as SMV balloon angioplasty and angioembolization left gastric vein She has been on Lovenox since  Developed recurrent bleeding with black tarry stools on 02/17/23 which then became more maroonish appearing Hemoglobin 9.6 down to 8.2  Repeat EGD here with multiple nonbleeding cratered gastric ulcers 2 with adherent clot in the prepyloric area largest 8 mm, clip was placed on the ulcer furthest away from the pylorus and the one closer to the pylorus was treated with coagulation and then Purastat to both ulcers  Eliquis  has been held  On twice daily PPI and Carafate  MRI/MRCP which was done outpatient on the day of admission -showed stable pancreatic head mass, gastroesophageal varices middle hepatic vein TIPS is occluded as well as occlusion of the portal veins and central superior mesenteric vein small volume ascites.  IR has reevaluated and plan is for repeat IR evaluation on Tuesday with thrombectomy, realizing that she is at high risk for repeat thrombosis  Increased abdominal girth over the past 36 hours likely secondary to increase in ascites  #2 aspiration pneumonia-on O2, the Zosyn  #3 anemia acute on chronic secondary to above  Plan; okay to advance to soft diet IV Protonix twice daily Continue Carafate 4 times daily Serial hemoglobins and transfuse to keep hemoglobin 7.5 or above  She may benefit from paracentesis when she goes down for IR thrombectomies on Tuesday.  Off Lovenox for now which will need to be restarted on Tuesday if she has thrombectomy  GI will continue to follow along.     Principal Problem:   BRBPR (bright red blood per rectum) Active Problems:   Malignant neoplasm of pancreas (HCC)   GI bleed   Superior mesenteric vein thrombosis (HCC)     LOS: 3  days   Amy Esterwood PA-C 02/20/2023, 1:56 PM    Attending physician's note   I have taken history, reviewed the chart and examined the patient. I performed a substantive portion of this encounter, including complete performance of at least one of the key components, in conjunction with the APP. I agree with the Advanced Practitioner's note, impression and recommendations.   No further bleeding No significant complaints.  Wants to advance her diet. Hb stable  IMP Recurrent UGI bleeding d/t severe GAVE, APC ulcers s/p endo Rx. Occluded recently placed TIPS along with PV/SMV recurrent thrombosis despite recent thrombectomy 02/09/2023 and Lovenox.  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   H/O PE 2022  Plan: -Tentative plans for reattempt thrombectomy on Tuesday after discussions with Dr. Elby Showers. -Trend CBC -Continue IV Protonix. -If  thrombectomy is attempted, can restart Lovenox. -Discussed extensively with pt.  -Advance diet  Dr Myrtie Neither taking over the service tomorrow.   Edman Circle, MD [facility] [phone]

## 2023-02-20 NOTE — Evaluation (Signed)
Occupational Therapy Evaluation Patient Details Name: Sheryl Bender MRN: [id number] DOB: 05/04/1945 Today's Date: 02/20/2023   History of Present Illness 78 y.o. female admitted 1/23 with Recurrent GI bleeding. S/p EGD with hemostasis clip placement 1/24. 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  hospitalized from 02/06/2023-02/13/2023 with GI bleeding requiring EGD.   Clinical Impression   Pt reports ind at baseline with ADL/functional mobility, lives alone but has a group of people she reports could assist her at d/c. Pt currently needing up to min A for ADLs, mod I for bed mobility and supervision for transfers without AD. VSS on 1.5-2L O2 throughout session. Pt presenting with impairments listed below, will follow acutely. Recommend HHOT at d/c.        If plan is discharge home, recommend the following: A little help with walking and/or transfers;A little help with bathing/dressing/bathroom;Assistance with cooking/housework;Assist for transportation;Help with stairs or ramp for entrance    Functional Status Assessment  Patient has had a recent decline in their functional status and demonstrates the ability to make significant improvements in function in a reasonable and predictable amount of time.  Equipment Recommendations  None recommended by OT (pt has all needed DME)    Recommendations for Other Services PT consult     Precautions / Restrictions Precautions Precautions: Fall Restrictions Weight Bearing Restrictions Per Provider Order: No      Mobility Bed Mobility Overal bed mobility: Modified Independent                  Transfers Overall transfer level: Needs assistance Equipment used: None Transfers: Sit to/from Stand Sit to Stand: Supervision                   Balance Overall balance assessment: Needs assistance Sitting-balance support: No upper extremity supported, Feet supported Sitting balance-Leahy Scale: Good Sitting balance - Comments: sitting EOB   Standing balance support: No upper extremity supported, During functional activity Standing balance-Leahy Scale: Fair                             ADL either performed or assessed with clinical judgement   ADL Overall ADL's : Needs assistance/impaired Eating/Feeding: Set up;Sitting   Grooming: Set up;Sitting   Upper Body Bathing: Minimal assistance;Sitting   Lower Body Bathing: Minimal assistance;Sitting/lateral leans   Upper Body Dressing : Minimal assistance;Sitting   Lower Body Dressing: Sitting/lateral leans;Moderate assistance   Toilet Transfer: Contact guard Engineer, agricultural and Hygiene: Supervision/safety       Functional mobility during ADLs: Contact guard assist       Vision   Vision Assessment?: No apparent visual deficits     Perception Perception: Not tested       Praxis Praxis: Not tested       Pertinent Vitals/Pain Pain Assessment Pain Assessment: No/denies pain     Extremity/Trunk Assessment Upper Extremity Assessment Upper Extremity Assessment: Generalized weakness (hx of L shoulder deficits)   Lower Extremity Assessment Lower Extremity Assessment: Defer to PT evaluation   Cervical / Trunk Assessment Cervical / Trunk Assessment: Normal   Communication Communication Communication: No apparent difficulties   Cognition Arousal: Lethargic, Suspect due to medications Behavior During Therapy: WFL for tasks assessed/performed Overall Cognitive Status: Within Functional Limits for tasks assessed  General Comments: pt feels very drowsy, initially needing mod cues to remain alert     General Comments  VSS on 1.5-2L  O2    Exercises     Shoulder Instructions      Home Living Family/patient expects to be discharged to:: Private residence Living Arrangements: Alone Available Help at Discharge: Family;Friend(s) Type of Home: Apartment Home Access: Stairs to enter Entrance Stairs-Number of Steps: 1 Entrance Stairs-Rails: None Home Layout: One level     Bathroom Shower/Tub: Chief Strategy Officer: Standard Bathroom Accessibility: Yes   Home Equipment: Agricultural consultant (2 wheels);Shower seat;Cane - single point          Prior Functioning/Environment Prior Level of Function : Independent/Modified Independent             Mobility Comments: Uses cane outside. ADLs Comments: Prior admission "Independent with all ADL's. Occasionally has help to clean." since returning home from prior admission she was able to do most ADLs but was having trouble with getting in the tub to shower.        OT Problem List: Decreased strength;Decreased range of motion;Decreased activity tolerance;Impaired balance (sitting and/or standing);Cardiopulmonary status limiting activity;Decreased cognition      OT Treatment/Interventions: Self-care/ADL training;Therapeutic exercise;Energy conservation;DME and/or AE instruction;Therapeutic activities;Patient/family education;Balance training    OT Goals(Current goals can be found in the care plan section) Acute Rehab OT Goals Patient Stated Goal: none stated OT Goal Formulation: With patient Time For Goal Achievement: 03/06/23 Potential to Achieve Goals: Good ADL Goals Pt Will Perform Upper Body Dressing: with supervision;sitting Pt Will Perform Lower Body Dressing: with contact guard assist;sitting/lateral leans;sit to/from stand Pt Will Transfer to Toilet: with modified independence;ambulating;regular height toilet  OT Frequency: Min 1X/week    Co-evaluation              AM-PAC OT "6 Clicks" Daily Activity     Outcome Measure Help from another  person eating meals?: None Help from another person taking care of personal grooming?: A Little Help from another person toileting, which includes using toliet, bedpan, or urinal?: A Little Help from another person bathing (including washing, rinsing, drying)?: A Little Help from another person to put on and taking off regular upper body clothing?: A Little Help from another person to put on and taking off regular lower body clothing?: A Lot 6 Click Score: 18   End of Session Equipment Utilized During Treatment: Oxygen (1.5-2L) Nurse Communication: Mobility status (pt's reporting IV pulled and port feels like it is leaking, RN present at end of session to assess)  Activity Tolerance: Patient tolerated treatment well Patient left: in chair;with call bell/phone within reach;with chair alarm set  OT Visit Diagnosis: Unsteadiness on feet (R26.81);Other abnormalities of gait and mobility (R26.89);Muscle weakness (generalized) (M62.81)                Time: 5621-3086 OT Time Calculation (min): 28 min Charges:  OT General Charges $OT Visit: 1 Visit OT Evaluation $OT Eval Moderate Complexity: 1 Mod OT Treatments $Self Care/Home Management : 8-22 mins  Sheryl Fila, OTD, OTR/L SecureChat Preferred Acute Rehab [phone]   Sheryl Bender 02/20/2023, 10:56 AM

## 2023-02-20 NOTE — Progress Notes (Signed)
Needle to right chest port came out during mobility. Site w/o redness or swelling. MD notified. No new orders received. PIV started to right hand. Flushes well

## 2023-02-21 ENCOUNTER — Encounter (HOSPITAL_COMMUNITY): Payer: Self-pay | Admitting: Gastroenterology

## 2023-02-21 DIAGNOSIS — K31819 Angiodysplasia of stomach and duodenum without bleeding: Secondary | ICD-10-CM

## 2023-02-21 DIAGNOSIS — K921 Melena: Secondary | ICD-10-CM

## 2023-02-21 DIAGNOSIS — K625 Hemorrhage of anus and rectum: Secondary | ICD-10-CM | POA: Diagnosis not present

## 2023-02-21 DIAGNOSIS — D62 Acute posthemorrhagic anemia: Secondary | ICD-10-CM | POA: Diagnosis not present

## 2023-02-21 LAB — CBC WITH DIFFERENTIAL/PLATELET
Abs Immature Granulocytes: 0.04 10*3/uL (ref 0.00–0.07)
Basophils Absolute: 0 10*3/uL (ref 0.0–0.1)
Basophils Relative: 0 %
Eosinophils Absolute: 0.1 10*3/uL (ref 0.0–0.5)
Eosinophils Relative: 2 %
HCT: 23.6 % — ABNORMAL LOW (ref 36.0–46.0)
Hemoglobin: 7.5 g/dL — ABNORMAL LOW (ref 12.0–15.0)
Immature Granulocytes: 1 %
Lymphocytes Relative: 9 %
Lymphs Abs: 0.5 10*3/uL — ABNORMAL LOW (ref 0.7–4.0)
MCH: 29.3 pg (ref 26.0–34.0)
MCHC: 31.8 g/dL (ref 30.0–36.0)
MCV: 92.2 fL (ref 80.0–100.0)
Monocytes Absolute: 0.3 10*3/uL (ref 0.1–1.0)
Monocytes Relative: 6 %
Neutro Abs: 4.4 10*3/uL (ref 1.7–7.7)
Neutrophils Relative %: 82 %
Platelets: 175 10*3/uL (ref 150–400)
RBC: 2.56 MIL/uL — ABNORMAL LOW (ref 3.87–5.11)
RDW: 17.9 % — ABNORMAL HIGH (ref 11.5–15.5)
WBC: 5.4 10*3/uL (ref 4.0–10.5)
nRBC: 0 % (ref 0.0–0.2)

## 2023-02-21 LAB — BASIC METABOLIC PANEL
Anion gap: 5 (ref 5–15)
BUN: 6 mg/dL — ABNORMAL LOW (ref 8–23)
CO2: 20 mmol/L — ABNORMAL LOW (ref 22–32)
Calcium: 7.6 mg/dL — ABNORMAL LOW (ref 8.9–10.3)
Chloride: 111 mmol/L (ref 98–111)
Creatinine, Ser: 0.92 mg/dL (ref 0.44–1.00)
GFR, Estimated: >60 mL/min (ref >60)
Glucose, Bld: 117 mg/dL — ABNORMAL HIGH (ref 70–99)
Potassium: 3.3 mmol/L — ABNORMAL LOW (ref 3.5–5.1)
Sodium: 136 mmol/L (ref 135–145)

## 2023-02-21 LAB — HEMOGLOBIN AND HEMATOCRIT, BLOOD
HCT: 24.9 % — ABNORMAL LOW (ref 36.0–46.0)
Hemoglobin: 8.1 g/dL — ABNORMAL LOW (ref 12.0–15.0)

## 2023-02-21 LAB — MAGNESIUM: Magnesium: 2.1 mg/dL (ref 1.7–2.4)

## 2023-02-21 MED ORDER — POTASSIUM CHLORIDE CRYS ER 20 MEQ PO TBCR
40.0000 meq | EXTENDED_RELEASE_TABLET | Freq: Once | ORAL | Status: AC
  Administered 2023-02-21: 40 meq via ORAL
  Filled 2023-02-21: qty 2

## 2023-02-21 NOTE — Progress Notes (Signed)
Transition of Care [facility]) - Inpatient Brief Assessment   Patient Details  Name: Sheryl Bender MRN: [id number] Date of Birth: 02-Sep-1945  Transition of [facility]) CM/SW Contact:    Janae Bridgeman, RN Phone Number: 02/21/2023, 12:36 PM   Clinical Narrative: CM met with the patient at the bedside and patient admitted for GI bleed and is currently on oxygen in the hospital  Patient lives alone and has personal care services through [facility]  Patient has Cane and RW at the home and plans to return home when medically stable for discharge.  I offered home health services and patient declined and states that she plans to follow up [facility] for [facility] since her apartment does not have availability for places to walk/therapy.  No other TOC needs at this time and patient plans to return home when stable.   Transition of Care Asessment: Insurance and Status: Insurance coverage has been reviewed Patient has primary care physician: Yes Home environment has been reviewed: from home alone Prior level of function:: (P) Independent Prior/Current Home Services: (P) Current home services (Private pay services through [facility] Social Drivers of Health Review: (P) SDOH reviewed interventions complete Readmission risk has been reviewed: (P) Yes Transition of care needs: (P) transition of care needs identified, TOC will continue to follow

## 2023-02-21 NOTE — Progress Notes (Signed)
Mobility Specialist: Progress Note   02/21/23 1616  Mobility  Activity Ambulated with assistance to bathroom  Level of Assistance Standby assist, set-up cues, supervision of patient - no hands on  Assistive Device None  Distance Ambulated (ft) 25 ft  Activity Response Tolerated well  Mobility Referral Yes  Mobility visit 1 Mobility  Mobility Specialist Start Time (ACUTE ONLY) 1555  Mobility Specialist Stop Time (ACUTE ONLY) 1611  Mobility Specialist Time Calculation (min) (ACUTE ONLY) 16 min    Pt requested to use BR - received in bed off . Completed void and BM - pericare done ind. Sv throughout. Declined further ambulation d/t feeling fatigued and SOB - SpO2 WFL on RA. Left in bed with all needs met, call bell in reach.   Maurene Capes Mobility Specialist Please contact via SecureChat or Rehab office at [phone]

## 2023-02-21 NOTE — Progress Notes (Signed)
PROGRESS NOTE    Sheryl Bender  UXL:[id number] DOB: Oct 19, 1945 DOA: 02/17/2023 PCP: [name], DO   Brief Narrative:  78 y.o. 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  She presented again with rectal bleeding.  GI was consulted.  She underwent EGD on 02/18/2023.  IR has been also consulted for occluded recently placed TIPS.  Assessment & Plan:   Recurrent GI bleeding -Patient has had multiple recent hospitalizations for recurrent GI bleeding as discussed above with EGDs and colonoscopy.  Presented with rectal bleeding again.  Hemoglobin currently stable.  Monitor H&H.  Hemoglobin 7.5 this morning. -Underwent EGD on 02/18/2023: Showed multiple gastric ulcers, 2 with adherent clots, treated endoscopically along with GAVE and erythematous duodenopathy GI recommended to continue IV Protonix along with oral Carafate 4 times a day for 2 weeks.  Follow further GI recommendations. -Lovenox on hold.  History of SMV thrombosis -she underwent TIPS procedure, aspiration of portal vein thrombus, SMV  balloon angioplasty along with embolization of left gastric vein by IR on 02/09/2023 and was placed on full dose anticoagulation with Lovenox on 02/09/2023 with recommendation to continue it for a month then resume Eliquis.  Diet advancement as per GI -Anticoagulation to remain on hold till clearance by GI -IR has been reconsulted for occluded recently placed TIPS.  Acute respiratory failure with hypoxia Probable aspiration pneumonia -Chest x-ray on 02/18/2023 showed possible multifocal pneumonia.  She was started on IV Zosyn overnight on 02/18/2023. -Currently on 1.5 L oxygen by nasal cannula.  Wean off as able.    Malignant neoplasm of pancreas -Patient's pancreatic cancer was being treated at [facility] by [name], was currently being monitored off of chemotherapy for the last 3 months, she also went to [facility] for a Whipple's procedure but it could not be done due to the location of her tumor. Unfortunately CA 19.9 is now trending up and currently quite high  -Case was recently discussed during last hospitalization by hospitalist with [name] oncologist on 02/09/2023, also discussed her case with her primary oncologist [name] on 02/10/2023: Outpatient follow-up with oncology was advised at that time.  Depression -Continue bupropion, buspirone, escitalopram and mirtazapine  Leukopenia -Improved  Hypokalemia -Mild.  Replace.  Repeat a.m. labs  Thrombocytopenia -Improved  Goals of care -Patient was recently evaluated by palliative care during recent hospitalization with recommendations for outpatient palliative care follow-up.   DVT prophylaxis: SCDs Code Status: DNR Family Communication: None at bedside Disposition Plan: Status is: Inpatient Remains inpatient appropriate because: Of severity of illness  Consultants: GI/IR  Procedures:  EGD on 02/18/2023 Impression:               - Multiple gastric ulcers. 2 with adherent clots                             -  treated endoscopically.                           - Gastric antral vascular ectasia.                           - Erythematous duodenopathy.                           - Mod hiatal hernia.                           - No specimens collected. Recommendation:           - Return patient to hospital ward for ongoing care.                           - Clear liquid diet.                           - Trend CBC. Keep Hb> 8                           - IV Protonix                           - Carafate elixir 1 g p.o. 4 times daily x 2 weeks                           - IR consultation for occluded TIPS. I have                            discussed with Dr Grace Isaac (IR). Very unfortunate                            situation especially with non-operable pancreatic                            CA, PV thrombosis. IR will review and likely                            attempt thrombectomy early next week. There are                            limited options.                           - Avoid all nonsteroidals.                           - Hold anticoagulation as risk of bleeding is much                            higher.                           - The findings and recommendations were discussed  with the patient's family.  Antimicrobials: Zosyn from 02/18/2023 onwards   Subjective: Patient seen and examined at bedside.  Breathing is better but still feels very weak and tired.  Denies worsening chest pain, vomiting or abdominal pain. Objective: Vitals:   02/20/23 0844 02/20/23 1623 02/20/23 2034 02/21/23 0459  BP: 103/60 (!) 108/57 (!) 109/53 (!) 113/58  Pulse: 90 79 85 72  Resp:   20 15  Temp: 98.2 F (36.8 C) 98.4 F (36.9 C) 98.4 F (36.9 C) 97.9 F (36.6 C)  TempSrc:   Oral Oral  SpO2: 93% 95% 95% 95%  Weight:    66 kg  Height:        Intake/Output Summary (Last 24 hours) at 02/21/2023 0711 Last data filed at 02/21/2023 0500 Gross per 24 hour  Intake 2900.94 ml  Output  276 ml  Net 2624.94 ml   Filed Weights   02/19/23 0559 02/20/23 0321 02/21/23 0459  Weight: 60 kg 66 kg 66 kg    Examination:  General: Currently on 1.5 l oxygen via nasal cannula.  No acute distress currently.  Chronically ill and deconditioned looking. ENT/neck: No elevated JVD or palpable thyromegaly noted  respiratory: Decreased breath sounds at bases bilaterally with some crackles  CVS: Rate mostly controlled; S1 and S2 are heard Abdominal: Soft, nontender, slightly distended; no organomegaly, bowel sounds are heard normally Extremities: No clubbing; mild lower extremity edema present  CNS: Awake; still slow to respond.  No obvious focal neurologic deficit.   Lymph: No lymphadenopathy palpable Skin: No obvious ecchymosis/lesions psych: Affect is mostly flat.  Not agitated currently  musculoskeletal: No obvious joint deformity/tenderness    Data Reviewed: I have personally reviewed following labs and imaging studies  CBC: Recent Labs  Lab 02/17/23 1103 02/17/23 1735 02/18/23 0627 02/18/23 1050 02/19/23 0525 02/19/23 0956 02/19/23 1555 02/19/23 2247 02/20/23 0420 02/21/23 0054  WBC 3.7* 3.8* 2.3*  --  8.9  --   --   --  7.1 5.4  NEUTROABS 2.7  --   --   --  8.3*  --   --   --  6.2 4.4  HGB 9.6* 9.9* 8.2*   < > 8.2* 8.6* 8.1* 7.3* 7.3* 7.5*  HCT 29.4* 31.1* 25.9*   < > 25.1* 25.7* 24.9* 22.3* 23.3* 23.6*  MCV 90.5 91.2 91.2  --  91.3  --   --   --  92.8 92.2  PLT 175 200 163  --  149*  --   --   --  150 175   < > = values in this interval not displayed.   Basic Metabolic Panel: Recent Labs  Lab 02/17/23 1103 02/17/23 1735 02/18/23 0627 02/19/23 0525 02/20/23 0420 02/21/23 0054  NA 143 142 143 140 142 136  K 3.3* 3.7 3.2* 3.0* 3.5 3.3*  CL 112* 110 112* 112* 113* 111  CO2 24 22 24 24  21* 20*  GLUCOSE 108* 112* 125* 115* 99 117*  BUN 20 21 15 13 10  6*  CREATININE 0.68 0.76 0.67 0.82 0.88 0.92  CALCIUM 8.5* 8.8* 8.2* 7.9* 7.7* 7.6*  MG 2.2  --   --  1.8  2.3 2.1   GFR: Estimated Creatinine Clearance: 44.8 mL/min (by C-G formula based on SCr of 0.92 mg/dL). Liver Function Tests: Recent Labs  Lab 02/17/23 1103 02/17/23 1735  AST 40 50*  ALT 46* 52*  ALKPHOS 98 104  BILITOT 0.7 0.4  PROT 5.6* 5.7*  ALBUMIN 2.8* 2.7*   No results  for input(s): "LIPASE", "AMYLASE" in the last 168 hours. No results for input(s): "AMMONIA" in the last 168 hours. Coagulation Profile: Recent Labs  Lab 02/18/23 0627  INR 1.1   Cardiac Enzymes: No results for input(s): "CKTOTAL", "CKMB", "CKMBINDEX", "TROPONINI" in the last 168 hours. BNP (last 3 results) No results for input(s): "PROBNP" in the last 8760 hours. HbA1C: No results for input(s): "HGBA1C" in the last 72 hours. CBG: No results for input(s): "GLUCAP" in the last 168 hours. Lipid Profile: No results for input(s): "CHOL", "HDL", "LDLCALC", "TRIG", "CHOLHDL", "LDLDIRECT" in the last 72 hours. Thyroid Function Tests: No results for input(s): "TSH", "T4TOTAL", "FREET4", "T3FREE", "THYROIDAB" in the last 72 hours. Anemia Panel: No results for input(s): "VITAMINB12", "FOLATE", "FERRITIN", "TIBC", "IRON", "RETICCTPCT" in the last 72 hours. Sepsis Labs: Recent Labs  Lab 02/18/23 0627 02/20/23 0420  PROCALCITON <0.10 7.22    No results found for this or any previous visit (from the past 240 hours).       Radiology Studies: No results found.       Scheduled Meds:  buPROPion  300 mg Oral QPC breakfast   busPIRone  20 mg Oral BID   Chlorhexidine Gluconate Cloth  6 each Topical Daily   cholecalciferol  10,000 Units Oral q morning   escitalopram  20 mg Oral QHS   mirtazapine  15 mg Oral QHS   pantoprazole (PROTONIX) IV  40 mg Intravenous Q12H   QUEtiapine  25 mg Oral BID   sodium chloride flush  10-40 mL Intracatheter Q12H   sodium chloride flush  3 mL Intravenous Q12H   sucralfate  1 g Oral TID WC & HS   Continuous Infusions:  sodium chloride 50 mL/hr at 02/20/23 1524    piperacillin-tazobactam (ZOSYN)  IV 3.375 g (02/21/23 0531)          Glade Lloyd, MD [facility] 02/21/2023, 7:11 AM

## 2023-02-21 NOTE — Progress Notes (Addendum)
[facility] Progress Note  CC: Recurrent GI bleed, pancreatic cancer  Subjective: She is tolerating a soft diet.  No nausea or vomiting.  She just passed a brown bowel movement with sediment and diluted red blood in the toilet water which I viewed in the commode.  No melena.  He endorses having generalized abdominal bloat with twinges of right mid abdominal pain.  No severe abdominal pain.  No chest pain or shortness of breath.  Objective:  Vital signs in last 24 hours: Temp:  [97.9 F (36.6 C)-98.5 F (36.9 C)] 98.5 F (36.9 C) (01/27 0905) Pulse Rate:  [72-85] 78 (01/27 0905) Resp:  [15-20] 17 (01/27 0905) BP: (108-123)/(53-63) 123/63 (01/27 0905) SpO2:  [95 %-96 %] 96 % (01/27 0905) Weight:  [66 kg] 66 kg (01/27 0459) Last BM Date : 02/20/23 General: 78 year old female in no acute distress. Heart: Regular rate and rhythm, no murmurs. Pulm: Breath sounds coarse, mildly decreased in the bases.  On oxygen 2 L nasal cannula. Abdomen: Soft, nondistended.  Nontender.  Positive bowel sounds to all 4 quadrants.  Mid abdominal scar intact.  Ecchymosis to the right mid abdomen.  No palpable mass.  No bruit. Extremities: No edema. Neurologic:  Alert and  oriented x 4. Grossly normal neurologically. Psych:  Alert and cooperative. Normal mood and affect.  Intake/Output from previous day: 01/26 0701 - 01/27 0700 In: 2900.9 [I.V.:2622.9; IV Piggyback:278.1] Out: 276 [Urine:275; Stool:1] Intake/Output this shift: No intake/output data recorded.  Lab Results: Recent Labs    02/19/23 0525 02/19/23 0956 02/19/23 2247 02/20/23 0420 02/21/23 0054  WBC 8.9  --   --  7.1 5.4  HGB 8.2*   < > 7.3* 7.3* 7.5*  HCT 25.1*   < > 22.3* 23.3* 23.6*  PLT 149*  --   --  150 175   < > = values in this interval not displayed.   BMET Recent Labs    02/19/23 0525 02/20/23 0420 02/21/23 0054  NA 140 142 136  K 3.0* 3.5 3.3*  CL 112* 113* 111  CO2 24 21* 20*  GLUCOSE 115* 99  117*  BUN 13 10 6*  CREATININE 0.82 0.88 0.92  CALCIUM 7.9* 7.7* 7.6*   LFT No results for input(s): "PROT", "ALBUMIN", "AST", "ALT", "ALKPHOS", "BILITOT", "BILIDIR", "IBILI" in the last 72 hours. PT/INR No results for input(s): "LABPROT", "INR" in the last 72 hours. Hepatitis Panel No results for input(s): "HEPBSAG", "HCVAB", "HEPAIGM", "HEPBIGM" in the last 72 hours.  No results found.  Patient Profile: 78 year old female with a past medical history of PE (previously on Eliquis), iron deficiency anemia, interstitial pancreatitis, stage I pancreatic adenocarcinoma (initially diagnosed in 2022) with extensive mesenteric thrombosis status post TIPS and SMV thrombectomy 02/09/2023 and recurrent GI bleeding s/p 5 EGDs Dec 2024 - Jan 2025.  Assessment / Plan:  78 year old female admitted to the hospital 02/17/2023 with recurrent GI bleed, black tarry and maroon colored stools. Admission Hg 9.6 down from 8.2.  Prior EGD by Dr. 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  EGD 02/06/2022 showed 5 nonbleeding cratered gastric ulcers clean-based likely secondary to prior APC treatment. EGD during this admission 02/18/2023 showed multiple cratered gastric ulcers 2 with adherent clot in the prepylorus largest 8 mm clip laced on the ulcer furthest from the pylorus the 1 closer to the pylorus was treated with coagulation for noted severe GAVE fundal varices.  Patient passed a soft brown bowel  movement with sediment which oozed a small amount of bright red blood diluted in the toilet water.  No melena.  Lovenox on hold.  Hemodynamically stable. -Continue soft diet -Continue Pantoprazole 40 mg IV twice daily -Continue Sucralfate 1 g p.o. 4 times daily -Continue to monitor patient closely for active GI bleeding -Await further recommendations per Dr. Myrtie Neither  Acute on chronic iron deficiency anemia, secondary to GI bleed and CKD.  Hg 7.3 -> today Hg  7.5.  -See plan above -CBC in a.m. -Transfuse for hemoglobin less than 7 or as needed if symptomatic  Pancreatic adenocarcinoma with extensive mesenteric thrombosis status post TIPS and SMV thrombectomy 02/09/2023.  Abdominal MRI w/wo contrast 1/23 showed no change in the ill-defined infiltrative soft tissue mass in the central pancreatic head 2.8 x 1.9 cm. TIPS occluded as well as occlusion of the portal veins and central SMV.  Small volume ascites.  Lovenox on hold. Off chemo x 3 months. Not a candidate for Whipple's procedure.  -? Scheduled for IR thrombectomy with IR on 02/22/2023 -Consider paracentesis at time of thrombectomy if enough ascites to tap -Continue follow-up with [name] and primary oncologist [name]  Aspiration pneumonia.  MRI showed small bilateral pleural effusions.  On Zosyn IV.  Oxygen 2 L nasal cannula.  Principal Problem:   BRBPR (bright red blood per rectum) Active Problems:   Malignant neoplasm of pancreas (HCC)   GI bleed   Superior mesenteric vein thrombosis (HCC)     LOS: 4 days   Arnaldo Natal  02/21/2023, 11:23 AM  I have taken an interval history, thoroughly reviewed the chart and examined the patient. I agree with the Advanced Practitioner's note, impression and recommendations, and have recorded additional findings, impressions and recommendations below. I performed a substantive portion of this encounter (>50% time spent), including a complete performance of the medical decision making.  My additional thoughts are as follows:  I had a long discussion with this patient today about the difficult situation she is in. Underlying nonoperative pancreatic malignancy, recurrent mesenteric thrombosis, but anticoagulation then causes her GAVE to cause marked upper GI bleeding.  The endoscopic treatment she has received helped somewhat, but I think discontinuation of anticoagulation probably was the lion share of stopping the bleeding.  As such,  we presently do not have a more durable endoscopic solution to this GAVE.  From her recollection of a conversation with Dr. Chales Abrahams and some notes he wrote on her procedure report, he may have been discussing RFA as an option, though we do not presently have advanced endoscopist available for the next few weeks who can do this. Even if was done right now, it causes significant superficial but diffuse mucosal ulceration that would still present in immediate risk of bleeding if anticoagulation is resumed.  Right now she is awaiting a discussion with the IR physician about any options they may have to deal with this clot, and it sounds like she is also hoping to hear from her oncologist about "how long I have got" if she were not to have any further procedures done.  She is considering hospice as an option.  GI service will remain on standby for now for any further advice if needed.  Charlie Pitter III Office:[phone]

## 2023-02-22 DIAGNOSIS — K625 Hemorrhage of anus and rectum: Secondary | ICD-10-CM | POA: Diagnosis not present

## 2023-02-22 LAB — MAGNESIUM: Magnesium: 1.9 mg/dL (ref 1.7–2.4)

## 2023-02-22 LAB — CBC WITH DIFFERENTIAL/PLATELET
Abs Immature Granulocytes: 0.04 10*3/uL (ref 0.00–0.07)
Basophils Absolute: 0 10*3/uL (ref 0.0–0.1)
Basophils Relative: 1 %
Eosinophils Absolute: 0.1 10*3/uL (ref 0.0–0.5)
Eosinophils Relative: 2 %
HCT: 24.7 % — ABNORMAL LOW (ref 36.0–46.0)
Hemoglobin: 8 g/dL — ABNORMAL LOW (ref 12.0–15.0)
Immature Granulocytes: 1 %
Lymphocytes Relative: 12 %
Lymphs Abs: 0.5 10*3/uL — ABNORMAL LOW (ref 0.7–4.0)
MCH: 29.6 pg (ref 26.0–34.0)
MCHC: 32.4 g/dL (ref 30.0–36.0)
MCV: 91.5 fL (ref 80.0–100.0)
Monocytes Absolute: 0.4 10*3/uL (ref 0.1–1.0)
Monocytes Relative: 10 %
Neutro Abs: 2.8 10*3/uL (ref 1.7–7.7)
Neutrophils Relative %: 74 %
Platelets: 190 10*3/uL (ref 150–400)
RBC: 2.7 MIL/uL — ABNORMAL LOW (ref 3.87–5.11)
RDW: 17.8 % — ABNORMAL HIGH (ref 11.5–15.5)
WBC: 3.8 10*3/uL — ABNORMAL LOW (ref 4.0–10.5)
nRBC: 0 % (ref 0.0–0.2)

## 2023-02-22 LAB — BASIC METABOLIC PANEL
Anion gap: 6 (ref 5–15)
BUN: 6 mg/dL — ABNORMAL LOW (ref 8–23)
CO2: 21 mmol/L — ABNORMAL LOW (ref 22–32)
Calcium: 8.1 mg/dL — ABNORMAL LOW (ref 8.9–10.3)
Chloride: 113 mmol/L — ABNORMAL HIGH (ref 98–111)
Creatinine, Ser: 0.83 mg/dL (ref 0.44–1.00)
GFR, Estimated: >60 mL/min (ref >60)
Glucose, Bld: 124 mg/dL — ABNORMAL HIGH (ref 70–99)
Potassium: 3.2 mmol/L — ABNORMAL LOW (ref 3.5–5.1)
Sodium: 140 mmol/L (ref 135–145)

## 2023-02-22 MED ORDER — SODIUM CHLORIDE 0.9 % IV SOLN: 2.0000 g | INTRAVENOUS | Status: AC

## 2023-02-22 MED ORDER — POTASSIUM CHLORIDE CRYS ER 20 MEQ PO TBCR
40.0000 meq | EXTENDED_RELEASE_TABLET | ORAL | Status: AC
  Administered 2023-02-22 (×2): 40 meq via ORAL
  Filled 2023-02-22 (×2): qty 2

## 2023-02-22 MED ORDER — BENZONATATE 100 MG PO CAPS
200.0000 mg | ORAL_CAPSULE | Freq: Three times a day (TID) | ORAL | Status: DC | PRN
  Administered 2023-02-22: 200 mg via ORAL
  Filled 2023-02-22: qty 2

## 2023-02-22 NOTE — Progress Notes (Signed)
Occupational Therapy Treatment Patient Details Name: Sheryl Bender MRN: [id number] DOB: 14-May-1945 Today's Date: 02/22/2023   History of present illness 78 y.o. female admitted 1/23 with Recurrent GI bleeding. S/p EGD with hemostasis clip placement 1/24. 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  hospitalized from 02/06/2023-02/13/2023 with GI bleeding requiring EGD.   OT comments  Pt progressing towards goals this session, able to complete ADL with set up - min A, mod I for bed mobility and supervision for transfers without AD. Pt able to stand at sink x7 min for grooming tasks and able to ambulate hallway distance, pt reports feeling fatigued and needing to lay down. SpO2 95% on RA. Pt presenting with impairments listed below, will follow acutely. Continue to recommend HHOT at d/c.       If plan is discharge home, recommend the following:  A little help with walking and/or transfers;A little help with bathing/dressing/bathroom;Assistance with cooking/housework;Assist for transportation;Help with stairs or ramp for entrance   Equipment Recommendations  None recommended by OT (pt has all needed DME)    Recommendations for Other Services PT consult    Precautions / Restrictions Precautions Precautions: Fall Restrictions Weight Bearing Restrictions Per Provider Order: No       Mobility Bed Mobility Overal bed mobility: Modified Independent                  Transfers Overall transfer level: Needs assistance Equipment used: None Transfers: Sit to/from Stand Sit to Stand: Supervision                 Balance Overall balance assessment: Needs assistance Sitting-balance support: No upper extremity supported, Feet supported Sitting balance-Leahy Scale:  Good Sitting balance - Comments: sitting EOB   Standing balance support: No upper extremity supported, During functional activity Standing balance-Leahy Scale: Fair                             ADL either performed or assessed with clinical judgement   ADL Overall ADL's : Needs assistance/impaired     Grooming: Set up;Standing;Oral care;Brushing hair;Wash/dry hands;Wash/dry face;Applying deodorant Grooming Details (indicate cue type and reason): stands ~7 min for grooming tasks         Upper Body Dressing : Minimal assistance Upper Body Dressing Details (indicate cue type and reason): doffing bra/donning gown     Toilet Transfer: Supervision/safety;Ambulation;Regular Toilet           Functional mobility during ADLs: Supervision/safety      Extremity/Trunk Assessment Upper Extremity Assessment Upper Extremity Assessment: Generalized weakness (hx of L shoulder deficits)   Lower Extremity Assessment Lower Extremity Assessment: Defer to PT evaluation        Vision   Vision Assessment?: No apparent visual deficits   Perception Perception Perception: Not tested   Praxis Praxis Praxis: Not tested    Cognition Arousal: Alert Behavior During Therapy: [facility] for tasks assessed/performed Overall Cognitive Status: Within Functional Limits for tasks assessed                                 General Comments: staes feeling tired        Exercises      Shoulder Instructions       General Comments SpO2 95% on RA at  end of session    Pertinent Vitals/ Pain       Pain Assessment Pain Assessment: No/denies pain  Home Living                                          Prior Functioning/Environment              Frequency  Min 1X/week        Progress Toward Goals  OT Goals(current goals can now be found in the care plan section)  Progress towards OT goals: Progressing toward goals  Acute Rehab OT Goals Patient  Stated Goal: none stated OT Goal Formulation: With patient Time For Goal Achievement: 03/06/23 Potential to Achieve Goals: Good ADL Goals Pt Will Perform Upper Body Dressing: with supervision;sitting Pt Will Perform Lower Body Dressing: with contact guard assist;sitting/lateral leans;sit to/from stand Pt Will Transfer to Toilet: with modified independence;ambulating;regular height toilet  Plan      Co-evaluation                 AM-PAC OT "6 Clicks" Daily Activity     Outcome Measure   Help from another person eating meals?: None Help from another person taking care of personal grooming?: A Little Help from another person toileting, which includes using toliet, bedpan, or urinal?: A Little Help from another person bathing (including washing, rinsing, drying)?: A Little Help from another person to put on and taking off regular upper body clothing?: A Little Help from another person to put on and taking off regular lower body clothing?: A Lot 6 Click Score: 18    End of Session    OT Visit Diagnosis: Unsteadiness on feet (R26.81);Other abnormalities of gait and mobility (R26.89);Muscle weakness (generalized) (M62.81)   Activity Tolerance Patient tolerated treatment well   Patient Left in bed;with call bell/phone within reach;with bed alarm set   Nurse Communication Mobility status        Time: 7425-9563 OT Time Calculation (min): 18 min  Charges: OT General Charges $OT Visit: 1 Visit OT Treatments $Self Care/Home Management : 8-22 mins  Carver Fila, OTD, OTR/L SecureChat Preferred Acute Rehab [phone]   Sayaka Hoeppner K Koonce 02/22/2023, 8:21 AM

## 2023-02-22 NOTE — Progress Notes (Signed)
Physical Therapy Treatment Patient Details Name: Sheryl Bender MRN: [id number] DOB: 04/20/1945 Today's Date: 02/22/2023   History of Present Illness 78 y.o. female admitted 1/23 with Recurrent GI bleeding. S/p EGD with hemostasis clip placement 1/24. 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  hospitalized from 02/06/2023-02/13/2023 with GI bleeding requiring EGD.    PT Comments  Pt requested assist to restroom due to bowel urgency. Demonstrates some improved stability with gait, albeit short distance in room. Mild instability but did not require physical assist. Cues for awareness of IV line, which I managed for her. Stood at sink unsupported to wash hands and perform some hygiene care, no loss of balance noted. States her knees feel weak but did not experience any buckling or overt LOB during my visit. MD in room at end of session to consult with pt. Patient will continue to benefit from skilled physical therapy services to further improve independence with functional mobility.   SpO2 94% on room air, BP 126/72 seated after short walk in room, and HR 94. Mild dyspnea, endorses fatigue.     If plan is discharge home, recommend the following: Assist for transportation;Assistance with cooking/housework;A little help with walking and/or transfers;A little help with bathing/dressing/bathroom;Help with stairs or ramp for entrance   Can travel by private vehicle        Equipment Recommendations  None recommended by PT    Recommendations for Other Services       Precautions / Restrictions Precautions Precautions: Fall Restrictions Weight Bearing Restrictions Per Provider Order: No     Mobility  Bed Mobility Overal bed mobility: Modified Independent             General bed  mobility comments: Extra time, effortful, no physical assist.    Transfers Overall transfer level: Needs assistance Equipment used: None Transfers: Sit to/from Stand Sit to Stand: Supervision           General transfer comment: Supervision for safety, very mild sway able to self correct, declines RW for support.    Ambulation/Gait Ambulation/Gait assistance: Contact guard assist Gait Distance (Feet): 15 Feet (x2) Assistive device: None Gait Pattern/deviations: Step-through pattern, Decreased stride length, Drifts right/left Gait velocity: decr Gait velocity interpretation: <1.31 ft/sec, indicative of household ambulator   General Gait Details: Minor sway but improved from initial evaluation, no physical assist needed, CGA for safety and intermittent cues for awareness of IV which I managed for her. She did hold onto furniture at times navigating in room. Bowel urgency.   Stairs             Wheelchair Mobility     Tilt Bed    Modified Rankin (Stroke Patients Only)       Balance Overall balance assessment: Needs assistance Sitting-balance support: No upper extremity supported, Feet supported Sitting balance-Leahy Scale: Good Sitting balance - Comments: sitting EOB   Standing balance support: No upper extremity supported, During functional activity Standing balance-Leahy Scale: Fair                              Cognition Arousal: Alert Behavior During Therapy: WFL for tasks assessed/performed Overall Cognitive Status: Within Functional Limits for tasks assessed  Exercises      General Comments General comments (skin integrity, edema, etc.): SpO2 94% on RA, BP 126/72 sitting after short walk inroom, HR 94.      Pertinent Vitals/Pain Pain Assessment Pain Assessment: Faces Faces Pain Scale: Hurts a little bit Pain Location: abdomen Pain Descriptors / Indicators: Discomfort,  Pressure Pain Intervention(s): Monitored during session    Home Living                          Prior Function            PT Goals (current goals can now be found in the care plan section) Acute Rehab PT Goals Patient Stated Goal: get stronger PT Goal Formulation: With patient Time For Goal Achievement: 03/11/23 Potential to Achieve Goals: Fair Progress towards PT goals: Progressing toward goals    Frequency    Min 1X/week      PT Plan      Co-evaluation              AM-PAC PT "6 Clicks" Mobility   Outcome Measure  Help needed turning from your back to your side while in a flat bed without using bedrails?: None Help needed moving from lying on your back to sitting on the side of a flat bed without using bedrails?: None Help needed moving to and from a bed to a chair (including a wheelchair)?: A Little Help needed standing up from a chair using your arms (e.g., wheelchair or bedside chair)?: A Little Help needed to walk in hospital room?: A Little Help needed climbing 3-5 steps with a railing? : A Little 6 Click Score: 20    End of Session Equipment Utilized During Treatment: Gait belt;Oxygen Activity Tolerance: Patient tolerated treatment well Patient left: with call bell/phone within reach;in bed;with bed alarm set (MD in room) Nurse Communication: Mobility status (tech) PT Visit Diagnosis: Unsteadiness on feet (R26.81);Muscle weakness (generalized) (M62.81);Other abnormalities of gait and mobility (R26.89)     Time: 1191-4782 PT Time Calculation (min) (ACUTE ONLY): 20 min  Charges:    $Gait Training: 8-22 mins PT General Charges $$ ACUTE PT VISIT: 1 Visit                     Kathlyn Sacramento, PT, DPT [facility]  Rehabilitation Services Physical Therapist Office: [phone] Website: [email or web address]    Berton Mount 02/22/2023, 10:16 AM

## 2023-02-22 NOTE — Progress Notes (Signed)
Pharmacy Antibiotic Note  Sheryl Bender is a 78 y.o. female admitted on 02/17/2023 .  Pharmacy has been consulted  on 1/25 for Zosyn dosing for aspiration PNA. Leukopenic. Renal function ok.   02/21/22 UPdate.:  WBC 7.1>5.4>3.8 trended down. Afebrile  -Respiratory status is improving: Currently on room air  Scr is stable <1  No cultures pending.   Plan: Continue Zosyn 3.375G IV q8h to be infused over 4 hours Monitor clinical status, renal function and culture results daily.     Height: 5' 2.5" (158.8 cm) Weight: 66.2 kg (146 lb) IBW/kg (Calculated) : 51.25  Temp (24hrs), Avg:98.4 F (36.9 C), Min:98.3 F (36.8 C), Max:98.5 F (36.9 C)  Recent Labs  Lab 02/18/23 0627 02/19/23 0525 02/20/23 0420 02/21/23 0054 02/22/23 0322  WBC 2.3* 8.9 7.1 5.4 3.8*  CREATININE 0.67 0.82 0.88 0.92 0.83    Estimated Creatinine Clearance: 49.7 mL/min (by C-G formula based on SCr of 0.83 mg/dL).    Allergies  Allergen Reactions   Other Hives and Other (See Comments)    Cherry wood just cut- "smelled it and broke out"; cannot tolerate ANY cherry fragrances, either      Cherry Hives   Wound Dressing Adhesive Rash and Other (See Comments)    Band-Aids = local reaction    Thank you for allowing pharmacy to be part of this patients care team.  Noah Delaine, RPh Clinical Pharmacist 02/22/2023 12:37 PM   Please check AMION for all [facility] phone numbers After 10:00 PM, call Main Pharmacy [phone]

## 2023-02-22 NOTE — Progress Notes (Signed)
[facility] night cross cover note:  Prn Tessalon Perles added for cough.   Newton Pigg, DO Hospitalist

## 2023-02-22 NOTE — Progress Notes (Signed)
PROGRESS NOTE    Sheryl Bender  FTD:[id number] DOB: 1945-04-24 DOA: 02/17/2023 PCP: [name], DO   Brief Narrative:  78 y.o. 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  She presented again with rectal bleeding.  GI was consulted.  She underwent EGD on 02/18/2023.  IR has been also consulted for occluded recently placed TIPS.  Assessment & Plan:   Recurrent GI bleeding -Patient has had multiple recent hospitalizations for recurrent GI bleeding as discussed above with EGDs and colonoscopy.  Presented with rectal bleeding again.  Hemoglobin currently stable.  Monitor H&H.  Hemoglobin 8 this morning. -Underwent EGD on 02/18/2023: Showed multiple gastric ulcers, 2 with adherent clots, treated endoscopically along with GAVE and erythematous duodenopathy GI recommended to continue IV Protonix along with oral Carafate 4 times a day for 2 weeks.   -Lovenox on hold.  History of SMV thrombosis -she underwent TIPS procedure, aspiration of portal vein thrombus, SMV balloon angioplasty along with  embolization of left gastric vein by IR on 02/09/2023 and was placed on full dose anticoagulation with Lovenox on 02/09/2023 with recommendation to continue it for a month then resume Eliquis.  Diet advancement as per GI -Anticoagulation to remain on hold till clearance by GI -IR has been reconsulted for occluded recently placed TIPS: Patient is hopeful to speak to Dr. Elby Showers today.  Acute respiratory failure with hypoxia Probable aspiration pneumonia -Chest x-ray on 02/18/2023 showed possible multifocal pneumonia.  She was started on IV Zosyn overnight on 02/18/2023. -Respiratory status is improving: Currently on room air  Malignant neoplasm of pancreas -Patient's pancreatic cancer was being treated at [facility] by [name], was currently being monitored off of chemotherapy for the last 3 months, she also went to [facility] for a Whipple's procedure but it could not be done due to the location of her tumor. Unfortunately CA 19.9 is now trending up and currently quite high  -Case was recently discussed during last hospitalization by hospitalist with [name] oncologist on 02/09/2023, also discussed her case with her primary oncologist [name] on 02/10/2023: Outpatient follow-up with oncology was advised at that time.  Depression -Continue bupropion, buspirone, escitalopram and mirtazapine  Leukopenia -Mild.  Monitor intermittently.  Hypokalemia  Replace.  Repeat a.m. labs  Thrombocytopenia -Improved  Goals of care -Patient was recently evaluated by palliative care during recent hospitalization with recommendations for outpatient palliative care follow-up.   DVT prophylaxis: SCDs Code Status: DNR Family Communication: None at bedside Disposition Plan: Status is: Inpatient Remains inpatient appropriate because: Of severity of illness  Consultants: GI/IR  Procedures:  EGD on 02/18/2023 Impression:               - Multiple gastric ulcers. 2 with adherent clots                             -  treated endoscopically.                           - Gastric antral vascular ectasia.                           - Erythematous duodenopathy.                           - Mod hiatal hernia.                           - No specimens collected. Recommendation:           - Return patient to hospital ward for ongoing care.                           - Clear liquid diet.                           - Trend CBC. Keep Hb> 8                           - IV Protonix                           - Carafate elixir 1 g p.o. 4 times daily x 2 weeks                           - IR consultation for occluded TIPS. I have                            discussed with Dr Grace Isaac (IR). Very unfortunate                            situation especially with non-operable pancreatic                            CA, PV thrombosis. IR will review and likely                            attempt thrombectomy early next week. There are                            limited options.                           - Avoid all nonsteroidals.                           - Hold anticoagulation as risk of bleeding is much                            higher.                           - The findings and recommendations were discussed  with the patient's family.  Antimicrobials: Zosyn from 02/18/2023 onwards   Subjective: Patient seen and examined at bedside.  Continues to feel weak.  Denies worsening abdomen pain, vomiting.  Still having intermittent cough.  Breathing is improving.  No chest pain reported.   Objective: Vitals:   02/21/23 1706 02/21/23 2045 02/22/23 0428 02/22/23 0500  BP: (!) 113/59 122/74 126/78   Pulse: 93 98 95   Resp: 17 18 18    Temp: 98.5 F (36.9 C) 98.5 F (36.9 C) 98.3 F (36.8 C)   TempSrc:  Oral Oral   SpO2: 92% 95% 94%   Weight:    66.2 kg  Height:        Intake/Output Summary (Last 24 hours) at 02/22/2023 0657 Last data filed at 02/22/2023 0600 Gross per 24 hour  Intake  148.55 ml  Output --  Net 148.55 ml   Filed Weights   02/20/23 0321 02/21/23 0459 02/22/23 0500  Weight: 66 kg 66 kg 66.2 kg    Examination:  General: On room air.  No distress.  Chronically ill and deconditioned looking. ENT/neck: No obvious neck masses or elevated JVD noted respiratory: Bilateral decreased breath sounds at bases with scattered crackles CVS: S1-S2 heard; rate currently controlled  abdominal: Soft, nontender, remains mildly distended; no organomegaly, normal bowel sounds heard  extremities: Trace lower extremity edema present; no cyanosis CNS: Remains slow to respond.  Alert.  No focal neurologic deficit noted.   Lymph: No obvious lymphadenopathy palpable Skin: No obvious rashes/petechiae  psych: Showing no signs of agitation.  Flat affect currently  musculoskeletal: No obvious joint swelling/erythema   Data Reviewed: I have personally reviewed following labs and imaging studies  CBC: Recent Labs  Lab 02/17/23 1103 02/17/23 1735 02/18/23 0627 02/18/23 1050 02/19/23 0525 02/19/23 0956 02/19/23 2247 02/20/23 0420 02/21/23 0054 02/21/23 1059 02/22/23 0322  WBC 3.7*   < > 2.3*  --  8.9  --   --  7.1 5.4  --  3.8*  NEUTROABS 2.7  --   --   --  8.3*  --   --  6.2 4.4  --  2.8  HGB 9.6*   < > 8.2*   < > 8.2*   < > 7.3* 7.3* 7.5* 8.1* 8.0*  HCT 29.4*   < > 25.9*   < > 25.1*   < > 22.3* 23.3* 23.6* 24.9* 24.7*  MCV 90.5   < > 91.2  --  91.3  --   --  92.8 92.2  --  91.5  PLT 175   < > 163  --  149*  --   --  150 175  --  190   < > = values in this interval not displayed.   Basic Metabolic Panel: Recent Labs  Lab 02/17/23 1103 02/17/23 1735 02/18/23 0627 02/19/23 0525 02/20/23 0420 02/21/23 0054 02/22/23 0322  NA 143   < > 143 140 142 136 140  K 3.3*   < > 3.2* 3.0* 3.5 3.3* 3.2*  CL 112*   < > 112* 112* 113* 111 113*  CO2 24   < > 24 24 21* 20* 21*  GLUCOSE 108*   < > 125* 115* 99 117* 124*  BUN 20   < > 15 13 10  6* 6*  CREATININE 0.68   < >  0.67 0.82 0.88 0.92 0.83  CALCIUM 8.5*   < > 8.2* 7.9* 7.7* 7.6* 8.1*  MG 2.2  --   --  1.8 2.3  2.1 1.9   < > = values in this interval not displayed.   GFR: Estimated Creatinine Clearance: 49.7 mL/min (by C-G formula based on SCr of 0.83 mg/dL). Liver Function Tests: Recent Labs  Lab 02/17/23 1103 02/17/23 1735  AST 40 50*  ALT 46* 52*  ALKPHOS 98 104  BILITOT 0.7 0.4  PROT 5.6* 5.7*  ALBUMIN 2.8* 2.7*   No results for input(s): "LIPASE", "AMYLASE" in the last 168 hours. No results for input(s): "AMMONIA" in the last 168 hours. Coagulation Profile: Recent Labs  Lab 02/18/23 0627  INR 1.1   Cardiac Enzymes: No results for input(s): "CKTOTAL", "CKMB", "CKMBINDEX", "TROPONINI" in the last 168 hours. BNP (last 3 results) No results for input(s): "PROBNP" in the last 8760 hours. HbA1C: No results for input(s): "HGBA1C" in the last 72 hours. CBG: No results for input(s): "GLUCAP" in the last 168 hours. Lipid Profile: No results for input(s): "CHOL", "HDL", "LDLCALC", "TRIG", "CHOLHDL", "LDLDIRECT" in the last 72 hours. Thyroid Function Tests: No results for input(s): "TSH", "T4TOTAL", "FREET4", "T3FREE", "THYROIDAB" in the last 72 hours. Anemia Panel: No results for input(s): "VITAMINB12", "FOLATE", "FERRITIN", "TIBC", "IRON", "RETICCTPCT" in the last 72 hours. Sepsis Labs: Recent Labs  Lab 02/18/23 0627 02/20/23 0420  PROCALCITON <0.10 7.22    No results found for this or any previous visit (from the past 240 hours).       Radiology Studies: No results found.       Scheduled Meds:  buPROPion  300 mg Oral QPC breakfast   busPIRone  20 mg Oral BID   Chlorhexidine Gluconate Cloth  6 each Topical Daily   cholecalciferol  10,000 Units Oral q morning   escitalopram  20 mg Oral QHS   mirtazapine  15 mg Oral QHS   pantoprazole (PROTONIX) IV  40 mg Intravenous Q12H   QUEtiapine  25 mg Oral BID   sodium chloride flush  10-40 mL Intracatheter Q12H   sodium  chloride flush  3 mL Intravenous Q12H   sucralfate  1 g Oral TID WC & HS   Continuous Infusions:  piperacillin-tazobactam (ZOSYN)  IV 3.375 g (02/22/23 0507)          Glade Lloyd, MD [facility] 02/22/2023, 6:57 AM

## 2023-02-22 NOTE — Progress Notes (Signed)
Referring Physician(s): Mike Gip, PA-C  Supervising Physician: Marliss Coots  Patient Status:  [facility] - In-pt  Subjective: Patient resting comfortably in bed. Sheryl Bender is alert, oriented and currently without pain or discomfort.  Chief Complaint: Occluded TIPS with GI bleed   HPI: Sheryl Bender is a 78 y.o. female with a medical history significant for pancreatic cancer s/p chemoradiation, pulmonary embolism on Eliquis, anemia, interstitial pancreatitis, HTN and recurrent GI bleeding. Sheryl Bender has been hospitalized multiple times in the past few months for GI bleeding and has undergone several endoscopic exams with interventions for bleeding. 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 IR was consulted for TIPS with portal thrombectomy for SMV and main portal thrombus, likely secondary to prior radiation to the pancreas. The patient was scheduled as an outpatient 02/15/23 with Dr. Elby Showers.    Sheryl Bender presented to the ED 02/06/23 with melena and weakness. Imaging showed the thrombus in the SMV had progressed and was now occlusive. The thrombus in the portal vein had also progressed. Sheryl Bender was taken back to endoscopy 02/07/23 and received treatment with argon plasma for gastric antral vascular ectasia. Five non-bleeding cratered gastric ulcers were also identified.    Interventional Radiology was consulted for an inpatient TIPS with thrombectomy and this was performed 02/09/23. In addition to TIPS creation Dr. Elby Showers also performed aspiration and mechanical thrombectomy with drug coated balloon angioplasty of the portal and superior mesenteric veins and embolization of a left gastric vein. A sample of the thrombus was sent for pathology and it was negative for  malignancy.  Sheryl Bender was discharged home 02/13/23 with instructions to take lovenox for one month before transitioning to Eliquis. Sheryl Bender unfortunately returned to the ED 02/17/23 for issues with rectal bleeding and imaging showed an occluded TIPS as well as portal vein and central superior mesenteric vein occlusions. Sheryl Bender was taken back to Endoscopy with GI which showed multiple cratered gastric ulcers, two with adherent clot. One ulcer was clipped and another was cauterized. Both were then treated with an application of purastat. 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   Interventional Radiology was asked to evaluate this patient for an image-guided TIPS revision with possible thrombectomy. Imaging reviewed and procedure approved by Dr. Elby Showers.   Allergies: Other, Cherry, and Wound dressing adhesive  Medications: Prior to Admission medications   Medication Sig Start Date End Date Taking? Authorizing Provider  acetaminophen (TYLENOL) 500 MG tablet Take 2 tablets (1,000 mg total) by mouth every 8 (eight) hours as needed for moderate pain, headache or fever. Patient taking differently: Take 750-1,000 mg by mouth every 8 (eight) hours as needed for moderate pain (pain score 4-6), headache or fever. 07/21/21  Yes Vassie Loll, MD  buPROPion (WELLBUTRIN XL) 300 MG 24 hr tablet Take 300 mg by mouth daily after breakfast. 06/15/21  Yes [provider]  busPIRone (BUSPAR) 10 MG tablet Take 20 mg by mouth 2 (two) times daily. 01/27/21  Yes [provider]  calcium carbonate (TUMS - DOSED IN MG ELEMENTAL CALCIUM) 500 MG chewable tablet Chew 1 tablet by mouth daily as needed for indigestion or heartburn.   Yes [provider]  Cholecalciferol (VITAMIN D3) 125 MCG (5000 UT) CAPS Take 10,000 Units by mouth every morning.  Yes [provider]  enoxaparin (LOVENOX) 60 MG/0.6ML injection Inject 0.6 mLs (60 mg total)  into the skin every 12 (twelve) hours. 02/12/23  Yes Leroy Sea, MD  escitalopram (LEXAPRO) 20 MG tablet Take 20 mg by mouth at bedtime.   Yes [provider]  lactose free nutrition (BOOST) LIQD Take 237 mLs by mouth 2 (two) times daily between meals.   Yes [provider]  LINZESS 72 MCG capsule TAKE 1 CAPSULE BY MOUTH DAILY BEFORE BREAKFAST Patient taking differently: Take 72 mcg by mouth daily before breakfast. 01/20/23  Yes Mansouraty, Netty Starring., MD  mirtazapine (REMERON) 30 MG tablet Take 15 mg by mouth at bedtime.   Yes [provider]  pantoprazole (PROTONIX) 40 MG tablet Take 1 tablet (40 mg total) by mouth 2 (two) times daily. 02/02/23 05/03/23 Yes Carollee Herter, DO  QUEtiapine (SEROQUEL) 25 MG tablet Take 25 mg by mouth 2 (two) times daily. 12/14/22  Yes [provider]  sucralfate (CARAFATE) 1 g tablet Take 1 tablet (1 g total) by mouth 4 (four) times daily -  with meals and at bedtime for 14 days. 02/12/23 02/26/23 Yes Leroy Sea, MD  lidocaine-prilocaine (EMLA) cream Apply a small amount to port a cath site (do not rub in) and cover with plastic wrap 1 hour prior to infusion appointments Patient not taking: Reported on 02/17/2023 09/15/21   Doreatha Massed, MD  ondansetron (ZOFRAN) 4 MG tablet Take 1 tablet (4 mg total) by mouth every 6 (six) hours as needed for nausea or vomiting. Patient not taking: Reported on 02/17/2023 02/02/23 03/04/23  Carollee Herter, DO     Vital Signs: BP 122/77 (BP Location: Right Arm)   Pulse 89   Temp 98.3 F (36.8 C) (Oral)   Resp 18   Ht 5' 2.5" (1.588 m)   Wt 146 lb (66.2 kg)   SpO2 94%   BMI 26.28 kg/m   Physical Exam Constitutional:      General: Sheryl Bender is not in acute distress.    Appearance: Sheryl Bender is not ill-appearing.  HENT:     Mouth/Throat:     Mouth: Mucous membranes are moist.     Pharynx: Oropharynx is clear.  Cardiovascular:     Rate and Rhythm: Normal rate.     Pulses: Normal pulses.   Pulmonary:     Effort: Pulmonary effort is normal.  Abdominal:     Tenderness: There is no abdominal tenderness.  Skin:    General: Skin is warm and dry.  Neurological:     Mental Status: Sheryl Bender is alert and oriented to person, place, and time.  Psychiatric:        Mood and Affect: Mood normal.        Behavior: Behavior normal.        Thought Content: Thought content normal.        Judgment: Judgment normal.     Imaging: DG Chest Port 1 View Result Date: 02/18/2023 CLINICAL DATA:  Shortness of breath EXAM: PORTABLE CHEST 1 VIEW COMPARISON:  07/13/2021 FINDINGS: Cardiac shadow is stable. Right chest wall port is now seen in satisfactory position. The lungs are well aerated bilaterally. Patchy left perihilar opacity is noted as well as some left retrocardiac opacity most consistent with multifocal pneumonia. Minimal changes in the lateral aspect of the right base are noted as well. No bony abnormality is seen. IMPRESSION: Patchy areas of increased parenchymal opacity bilaterally consistent with multifocal pneumonia. Electronically Signed   By: Loraine Leriche  Lukens M.D.   On: 02/18/2023 21:47    Labs:  CBC: Recent Labs    02/19/23 0525 02/19/23 0956 02/20/23 0420 02/21/23 0054 02/21/23 1059 02/22/23 0322  WBC 8.9  --  7.1 5.4  --  3.8*  HGB 8.2*   < > 7.3* 7.5* 8.1* 8.0*  HCT 25.1*   < > 23.3* 23.6* 24.9* 24.7*  PLT 149*  --  150 175  --  190   < > = values in this interval not displayed.    COAGS: Recent Labs    12/27/22 1923 01/29/23 1137 02/09/23 0404 02/18/23 0627  INR 1.1 0.9 1.1 1.1  APTT 58*  --   --  29    BMP: Recent Labs    02/19/23 0525 02/20/23 0420 02/21/23 0054 02/22/23 0322  NA 140 142 136 140  K 3.0* 3.5 3.3* 3.2*  CL 112* 113* 111 113*  CO2 24 21* 20* 21*  GLUCOSE 115* 99 117* 124*  BUN 13 10 6* 6*  CALCIUM 7.9* 7.7* 7.6* 8.1*  CREATININE 0.82 0.88 0.92 0.83  GFRNONAA >60 >60 >60 >60    LIVER FUNCTION TESTS: Recent Labs    02/12/23 0330  02/13/23 0345 02/17/23 1103 02/17/23 1735  BILITOT 0.6 0.4 0.7 0.4  AST 31 27 40 50*  ALT 50* 40 46* 52*  ALKPHOS 86 77 98 104  PROT 5.0* 4.7* 5.6* 5.7*  ALBUMIN 2.3* 2.2* 2.8* 2.7*    Assessment and Plan:  Occluded TIPS with GI Bleed: Sheryl Bender, 78 year old female, is tentatively scheduled 02/23/23 for an image-guided transjugular intrahepatic portosystemic shunt revision with possible thrombectomy. Dr. Elby Showers met with the patient at the bedside to discuss her image findings and possible treatment options. They discussed the risks and benefits of both conservative management with anticoagulation alone versus performing a TIPS revision/Thrombectomy. They discussed the likelihood of the TIPS re-occluding again and the patient is aware this is a possibility. The patient requested to proceed with TIPS revision/thrombectomy. Dr. Elby Showers also called the patient's son [name] and had a discussion with him about the rationale, risk and benefits of the proposed procedure. The patient's son was in agreement to proceed.   Risks and benefits the procedure were discussed with the patient and/or the patient's family including, but not limited to, infection, bleeding, damage to adjacent structures, worsening hepatic and/or cardiac function, worsening and/or the development of altered mental status/encephalopathy, non-target embolization and death.   This interventional procedure involves the use of X-rays and because of the nature of the planned procedure, it is possible that we will have prolonged use of X-ray fluoroscopy.  Potential radiation risks to you include (but are not limited to) the following: - A slightly elevated risk for cancer  several years later in life. This risk is typically less than 0.5% percent. This risk is low in comparison to the normal incidence of human cancer, which is 33% for women and 50% for men according to the [employer] Society. - Radiation induced injury  can include skin redness, resembling a rash, tissue breakdown / ulcers and hair loss (which can be temporary or permanent).   The likelihood of either of these occurring depends on the difficulty of the procedure and whether you are sensitive to radiation due to previous procedures, disease, or genetic conditions.   IF your procedure requires a prolonged use of radiation, you will be notified and given written instructions for further action.  It is your responsibility to monitor the irradiated area for  the 2 weeks following the procedure and to notify your physician if you are concerned that you have suffered a radiation induced injury.    All of the patient's questions were answered, patient is agreeable to proceed. Sheryl Bender will be NPO at midnight. Morning labs are ordered.   Consent signed and in IR.  Electronically Signed: Alwyn Ren, AGACNP-BC 02/22/2023, 12:07 PM   I spent a total of 25 Minutes at the the patient's bedside AND on the patient's hospital floor or unit, greater than 50% of which was counseling/coordinating care for TIPS thrombosis with GI bleed.

## 2023-02-22 NOTE — Care Management Important Message (Signed)
Important Message  Patient Details  Name: Sheryl Bender MRN: [id number] Date of Birth: December 07, 1945   Important Message Given:  Yes - Medicare IM     [name] 02/22/2023, 3:38 PM

## 2023-02-23 ENCOUNTER — Encounter (HOSPITAL_COMMUNITY): Payer: Self-pay | Admitting: Internal Medicine

## 2023-02-23 ENCOUNTER — Inpatient Hospital Stay (HOSPITAL_COMMUNITY): Payer: Medicare Other

## 2023-02-23 DIAGNOSIS — K922 Gastrointestinal hemorrhage, unspecified: Secondary | ICD-10-CM | POA: Diagnosis not present

## 2023-02-23 DIAGNOSIS — C250 Malignant neoplasm of head of pancreas: Secondary | ICD-10-CM | POA: Diagnosis not present

## 2023-02-23 DIAGNOSIS — K55069 Acute infarction of intestine, part and extent unspecified: Secondary | ICD-10-CM

## 2023-02-23 HISTORY — PX: IR THROMBECT SEC MECH MOD SED: IMG2299

## 2023-02-23 HISTORY — PX: IR TIPS REVISION MOD SED: IMG2296

## 2023-02-23 HISTORY — PX: IR US GUIDE VASC ACCESS LEFT: IMG2389

## 2023-02-23 LAB — CBC WITH DIFFERENTIAL/PLATELET
Abs Immature Granulocytes: 0.03 10*3/uL (ref 0.00–0.07)
Basophils Absolute: 0 10*3/uL (ref 0.0–0.1)
Basophils Relative: 0 %
Eosinophils Absolute: 0.1 10*3/uL (ref 0.0–0.5)
Eosinophils Relative: 3 %
HCT: 25.1 % — ABNORMAL LOW (ref 36.0–46.0)
Hemoglobin: 8 g/dL — ABNORMAL LOW (ref 12.0–15.0)
Immature Granulocytes: 1 %
Lymphocytes Relative: 20 %
Lymphs Abs: 0.6 10*3/uL — ABNORMAL LOW (ref 0.7–4.0)
MCH: 29.2 pg (ref 26.0–34.0)
MCHC: 31.9 g/dL (ref 30.0–36.0)
MCV: 91.6 fL (ref 80.0–100.0)
Monocytes Absolute: 0.3 10*3/uL (ref 0.1–1.0)
Monocytes Relative: 12 %
Neutro Abs: 1.9 10*3/uL (ref 1.7–7.7)
Neutrophils Relative %: 64 %
Platelets: 192 10*3/uL (ref 150–400)
RBC: 2.74 MIL/uL — ABNORMAL LOW (ref 3.87–5.11)
RDW: 17.7 % — ABNORMAL HIGH (ref 11.5–15.5)
WBC: 2.9 10*3/uL — ABNORMAL LOW (ref 4.0–10.5)
nRBC: 0 % (ref 0.0–0.2)

## 2023-02-23 LAB — COMPREHENSIVE METABOLIC PANEL
ALT: 21 U/L (ref 0–44)
AST: 20 U/L (ref 15–41)
Albumin: 2 g/dL — ABNORMAL LOW (ref 3.5–5.0)
Alkaline Phosphatase: 83 U/L (ref 38–126)
Anion gap: 8 (ref 5–15)
BUN: 6 mg/dL — ABNORMAL LOW (ref 8–23)
CO2: 19 mmol/L — ABNORMAL LOW (ref 22–32)
Calcium: 8.2 mg/dL — ABNORMAL LOW (ref 8.9–10.3)
Chloride: 112 mmol/L — ABNORMAL HIGH (ref 98–111)
Creatinine, Ser: 0.87 mg/dL (ref 0.44–1.00)
GFR, Estimated: >60 mL/min (ref >60)
Glucose, Bld: 113 mg/dL — ABNORMAL HIGH (ref 70–99)
Potassium: 3.9 mmol/L (ref 3.5–5.1)
Sodium: 139 mmol/L (ref 135–145)
Total Bilirubin: 0.4 mg/dL (ref 0.0–1.2)
Total Protein: 4.8 g/dL — ABNORMAL LOW (ref 6.5–8.1)

## 2023-02-23 LAB — PROTIME-INR
INR: 1.1 (ref 0.8–1.2)
Prothrombin Time: 14.7 s (ref 11.4–15.2)

## 2023-02-23 LAB — MAGNESIUM: Magnesium: 1.9 mg/dL (ref 1.7–2.4)

## 2023-02-23 MED ORDER — MIDAZOLAM HCL 2 MG/2ML IJ SOLN
INTRAMUSCULAR | Status: AC | PRN
  Administered 2023-02-23 (×2): .5 mg via INTRAVENOUS

## 2023-02-23 MED ORDER — LIDOCAINE-EPINEPHRINE 1 %-1:100000 IJ SOLN
INTRAMUSCULAR | Status: AC
  Filled 2023-02-23: qty 1

## 2023-02-23 MED ORDER — GUAIFENESIN-DM 100-10 MG/5ML PO SYRP
5.0000 mL | ORAL_SOLUTION | ORAL | Status: DC | PRN
  Administered 2023-02-25 – 2023-02-27 (×3): 5 mL via ORAL
  Filled 2023-02-23 (×3): qty 10

## 2023-02-23 MED ORDER — FENTANYL CITRATE (PF) 100 MCG/2ML IJ SOLN
INTRAMUSCULAR | Status: AC
  Filled 2023-02-23: qty 2

## 2023-02-23 MED ORDER — MIDAZOLAM HCL 2 MG/2ML IJ SOLN
INTRAMUSCULAR | Status: AC
  Filled 2023-02-23: qty 2

## 2023-02-23 MED ORDER — HEPARIN SODIUM (PORCINE) 1000 UNIT/ML IJ SOLN
INTRAMUSCULAR | Status: AC
  Filled 2023-02-23: qty 10

## 2023-02-23 MED ORDER — LORATADINE 10 MG PO TABS
10.0000 mg | ORAL_TABLET | Freq: Every day | ORAL | Status: DC
  Administered 2023-02-23 – 2023-03-01 (×7): 10 mg via ORAL
  Filled 2023-02-23 (×7): qty 1

## 2023-02-23 MED ORDER — HEPARIN SODIUM (PORCINE) 1000 UNIT/ML IJ SOLN
INTRAMUSCULAR | Status: AC | PRN
  Administered 2023-02-23: 3000 [IU] via INTRAVENOUS
  Administered 2023-02-23: 5000 [IU] via INTRAVENOUS

## 2023-02-23 MED ORDER — LIDOCAINE-EPINEPHRINE 1 %-1:100000 IJ SOLN
30.0000 mL | Freq: Once | INTRAMUSCULAR | Status: AC
  Administered 2023-02-23: 10 mL via INTRADERMAL

## 2023-02-23 MED ORDER — GUAIFENESIN ER 600 MG PO TB12
600.0000 mg | ORAL_TABLET | Freq: Two times a day (BID) | ORAL | Status: DC
  Administered 2023-02-23 – 2023-02-28 (×11): 600 mg via ORAL
  Filled 2023-02-23 (×11): qty 1

## 2023-02-23 MED ORDER — SODIUM CHLORIDE 0.9 % IV SOLN
INTRAVENOUS | Status: AC
  Filled 2023-02-23: qty 20

## 2023-02-23 MED ORDER — HEPARIN (PORCINE) 25000 UT/250ML-% IV SOLN
950.0000 [IU]/h | INTRAVENOUS | Status: DC
  Administered 2023-02-23 – 2023-02-24 (×2): 950 [IU]/h via INTRAVENOUS
  Filled 2023-02-23 (×2): qty 250

## 2023-02-23 MED ORDER — FENTANYL CITRATE (PF) 100 MCG/2ML IJ SOLN
INTRAMUSCULAR | Status: AC | PRN
  Administered 2023-02-23: 25 ug via INTRAVENOUS
  Administered 2023-02-23: 50 ug via INTRAVENOUS
  Administered 2023-02-23 (×2): 25 ug via INTRAVENOUS
  Administered 2023-02-23: 50 ug via INTRAVENOUS
  Administered 2023-02-23: 25 ug via INTRAVENOUS

## 2023-02-23 MED ORDER — IOHEXOL 300 MG/ML  SOLN: 150.0000 mL | Freq: Once | INTRAMUSCULAR | Status: DC | PRN

## 2023-02-23 NOTE — Progress Notes (Signed)
PROGRESS NOTE  Sheryl Bender ZOX:[id number] DOB: Oct 17, 1945   PCP: [name], DO  Patient is from: Home.  Independently ambulates at baseline.  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   Patient was hospitalized 1/12-1/19 GI bleed and SMV vein thrombosis.  She underwent TIPS procedure, aspiration of portal vein thrombus and SMV balloon angioplasty along with embolization of left gastric vein by IR on 1/15 and discharged on Lovenox for 1 months with a plan to transition to Eliquis afterward.  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  She was discharged on PPI and Carafate.  Anticoagulation held.  GI and IR consulted this hospitalization.  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  EGD on 1/24 showed multiple gastric ulcers, 2 with adherent clots that were treated endoscopically, gastric antral vascular ectasia, erythematous duodenopathy and moderate hiatal hernia.  GI recommended holding anticoagulation.  IR has been consulted for occluded TIPS.   IR planning TIPS revision/thrombectomy for PRBC revision/thrombectomy.   Subjective: Seen and examined earlier this morning.  No major events overnight of this morning.  She had a cough for which she was  started on Tessalon Perles last night.  She complains bilateral subcostal pain.  Denies shortness of breath.  Denies nausea or vomiting.  Objective: Vitals:   02/22/23 1623 02/22/23 2121 02/23/23 0407 02/23/23 0759  BP: 128/72 118/65 (!) 108/59 129/75  Pulse: 97 96 84 95  Resp: 18 20 20 18   Temp:  98.1 F (36.7 C) 98.2 F (36.8 C) 98.5 F (36.9 C)  TempSrc:    Oral  SpO2: 93% 94% 91% 93%  Weight:   65.3 kg   Height:        Examination:  GENERAL: No apparent distress.  Nontoxic. HEENT: MMM.  Vision and hearing grossly intact.  NECK: Supple.  No apparent JVD.  RESP:  No IWOB.  Fair aeration bilaterally.  Intermittently coughing. CVS:  RRR. Heart sounds normal.  ABD/GI/GU: BS+. Abd soft, NTND.  MSK/EXT:  Moves extremities. No apparent deformity. No edema.  SKIN: no apparent skin lesion or wound NEURO: Awake, alert and oriented appropriately.  No apparent focal neuro deficit. PSYCH: Calm. Normal affect.   Procedures:  1/24-EGD showed multiple gastric ulcers, 2 with adherent clots that were treated endoscopically, gastric antral vascular ectasia, erythematous duodenopathy and moderate hiatal hernia.  Microbiology summarized: None  Assessment and plan: Recurrent GI bleeding: Returns with bright red blood per rectum.  Patient is on Lovenox for the PE/DVT/SMV thrombosis.  Had extensive evaluation including EGD and colonoscopy last hospitalization.  Repeat EGD on 1/24 with multiple gastric ulcers (treated endoscopically), gastric antral vascular ectasia, erythematous duodenopathy and moderate hiatal hernia.  Hgb 9.6 on admission (was 9.2 when discharge on 1/19).  H&H relatively stable now. Recent Labs    02/18/23 2322 02/19/23 0525 02/19/23  7829 02/19/23 1555 02/19/23 2247 02/20/23 0420 02/21/23 0054 02/21/23 1059 02/22/23 0322 02/23/23 0311  HGB 8.3* 8.2* 8.6* 8.1* 7.3* 7.3* 7.5* 8.1* 8.0* 8.0*  -Appreciate GI recs: Hold anticoagulation, Protonix, Carafate 4 times daily  for 2 weeks... -Check anemia panel -Monitor H&H   History of PE/DVT/SMV thrombosis: Underwent TIPS procedure, aspiration of portal vein thrombus, SMV balloon angioplasty along with embolization of left gastric vein by IR on 02/09/2023 and was placed on Lovenox on 02/09/2023 with a plan to transition to Eliquis 1 month later.  MRI abdomen with occluded TIPS, portal vein and SMV.   -TIPS revision/possible thrombectomy by IR today  -Anticoagulation on hold due to GI bleed.  Will clarify with GI when anticoagulation can be resumed before discharge  Acute respiratory failure with hypoxia/possible aspiration pneumonia: CXR 1/24 showed multifocal pneumonia.  No shortness of breath but frequent cough.  She also reports postnasal drainage. -Continue IV Zosyn -Mucolytic's and antitussive -Start Claritin for postnasal drainage -Encourage incentive telemetry   Malignant neoplasm of pancreas: s/p chemoradiation.  Followed by [name].  Currently being monitored off chemo for the last 3 months. She also went to [facility] for a Whipple's procedure but it could not be done due to the location of her tumor. Unfortunately CA 19.9 is now trending up and currently quite high.  MRI abdomen with stable pancreatic head mass.  Case was recently discussed during last hospitalization by hospitalist with [name] oncologist on 02/09/2023, also discussed her case with her primary oncologist [name] on 02/10/2023: Outpatient follow-up with oncology was advised at that time. -Outpatient follow-up with oncology.   Anxiety/depression: Stable -Continue bupropion, buspirone, escitalopram and mirtazapine   Leukopenia: Unclear cause of this.  No recent chemotherapy or immunotherapy. -Continue monitoring   Hypokalemia -Monitor replenish as appropriate   Thrombocytopenia: Resolved   Body mass index is 25.91 kg/m.           DVT prophylaxis:  SCDs Start: 02/17/23 2241  Code Status: Full code Family  Communication: Updated patient's son over the phone from patient's phone at bedside Level of care: Med-Surg Status is: Inpatient Remains inpatient appropriate because: Recurrent GI bleed, occluded TIPS, aspiration pneumonia   Final disposition: Home Consultants:  Gastroenterology Interventional radiology  55 minutes with more than 50% spent in reviewing records, counseling patient/family and coordinating care.   Sch Meds:  Scheduled Meds:  buPROPion  300 mg Oral QPC breakfast   busPIRone  20 mg Oral BID   Chlorhexidine Gluconate Cloth  6 each Topical Daily   cholecalciferol  10,000 Units Oral q morning   escitalopram  20 mg Oral QHS   guaiFENesin  600 mg Oral BID   loratadine  10 mg Oral Daily   mirtazapine  15 mg Oral QHS   pantoprazole (PROTONIX) IV  40 mg Intravenous Q12H   QUEtiapine  25 mg Oral BID   sodium chloride flush  10-40 mL Intracatheter Q12H   sodium chloride flush  3 mL Intravenous Q12H   sucralfate  1 g Oral TID WC & HS   Continuous Infusions:  cefTRIAXone (ROCEPHIN)  IV     piperacillin-tazobactam (ZOSYN)  IV 3.375 g (02/23/23 0615)   PRN Meds:.acetaminophen **OR** acetaminophen, calcium carbonate, guaiFENesin-dextromethorphan, LORazepam, ondansetron (ZOFRAN) IV, polyethylene glycol, sodium chloride flush  Antimicrobials: Anti-infectives (From admission, onward)    Start     Dose/Rate Route Frequency Ordered Stop   02/23/23 0000  cefTRIAXone (ROCEPHIN) 2 g in sodium chloride 0.9 % 100 mL IVPB  2 g 200 mL/hr over 30 Minutes Intravenous To Radiology 02/22/23 1244 02/24/23 0000   02/19/23 0200  piperacillin-tazobactam (ZOSYN) IVPB 3.375 g        3.375 g 12.5 mL/hr over 240 Minutes Intravenous Every 8 hours 02/19/23 0037          I have personally reviewed the following labs and images: CBC: Recent Labs  Lab 02/19/23 0525 02/19/23 0956 02/20/23 0420 02/21/23 0054 02/21/23 1059 02/22/23 0322 02/23/23 0311  WBC 8.9  --  7.1 5.4  --  3.8*  2.9*  NEUTROABS 8.3*  --  6.2 4.4  --  2.8 1.9  HGB 8.2*   < > 7.3* 7.5* 8.1* 8.0* 8.0*  HCT 25.1*   < > 23.3* 23.6* 24.9* 24.7* 25.1*  MCV 91.3  --  92.8 92.2  --  91.5 91.6  PLT 149*  --  150 175  --  190 192   < > = values in this interval not displayed.   BMP &GFR Recent Labs  Lab 02/19/23 0525 02/20/23 0420 02/21/23 0054 02/22/23 0322 02/23/23 0311  NA 140 142 136 140 139  K 3.0* 3.5 3.3* 3.2* 3.9  CL 112* 113* 111 113* 112*  CO2 24 21* 20* 21* 19*  GLUCOSE 115* 99 117* 124* 113*  BUN 13 10 6* 6* 6*  CREATININE 0.82 0.88 0.92 0.83 0.87  CALCIUM 7.9* 7.7* 7.6* 8.1* 8.2*  MG 1.8 2.3 2.1 1.9 1.9   Estimated Creatinine Clearance: 47.1 mL/min (by C-G formula based on SCr of 0.87 mg/dL). Liver & Pancreas: Recent Labs  Lab 02/17/23 1103 02/17/23 1735 02/23/23 0311  AST 40 50* 20  ALT 46* 52* 21  ALKPHOS 98 104 83  BILITOT 0.7 0.4 0.4  PROT 5.6* 5.7* 4.8*  ALBUMIN 2.8* 2.7* 2.0*   No results for input(s): "LIPASE", "AMYLASE" in the last 168 hours. No results for input(s): "AMMONIA" in the last 168 hours. Diabetic: No results for input(s): "HGBA1C" in the last 72 hours. No results for input(s): "GLUCAP" in the last 168 hours. Cardiac Enzymes: No results for input(s): "CKTOTAL", "CKMB", "CKMBINDEX", "TROPONINI" in the last 168 hours. No results for input(s): "PROBNP" in the last 8760 hours. Coagulation Profile: Recent Labs  Lab 02/18/23 0627 02/23/23 0311  INR 1.1 1.1   Thyroid Function Tests: No results for input(s): "TSH", "T4TOTAL", "FREET4", "T3FREE", "THYROIDAB" in the last 72 hours. Lipid Profile: No results for input(s): "CHOL", "HDL", "LDLCALC", "TRIG", "CHOLHDL", "LDLDIRECT" in the last 72 hours. Anemia Panel: No results for input(s): "VITAMINB12", "FOLATE", "FERRITIN", "TIBC", "IRON", "RETICCTPCT" in the last 72 hours. Urine analysis:    Component Value Date/Time   COLORURINE YELLOW 05/06/2022 1225   APPEARANCEUR CLEAR 05/06/2022 1225   LABSPEC  1.017 05/06/2022 1225   PHURINE 5.0 05/06/2022 1225   GLUCOSEU NEGATIVE 05/06/2022 1225   HGBUR NEGATIVE 05/06/2022 1225   BILIRUBINUR NEGATIVE 05/06/2022 1225   KETONESUR NEGATIVE 05/06/2022 1225   PROTEINUR NEGATIVE 05/06/2022 1225   NITRITE NEGATIVE 05/06/2022 1225   LEUKOCYTESUR NEGATIVE 05/06/2022 1225   Sepsis Labs: Invalid input(s): "PROCALCITONIN", "LACTICIDVEN"  Microbiology: No results found for this or any previous visit (from the past 240 hours).  Radiology Studies: No results found.    Levi Klaiber T. Jesua Tamblyn [facility]  If 7PM-7AM, please contact night-coverage [email or web address] 02/23/2023, 11:11 AM

## 2023-02-23 NOTE — Progress Notes (Signed)
Mobility Specialist: Progress Note   02/23/23 1606  Mobility  Activity Ambulated with assistance in hallway  Level of Assistance Standby assist, set-up cues, supervision of patient - no hands on  Assistive Device None  Distance Ambulated (ft) 160 ft  Activity Response Tolerated well  Mobility Referral Yes  Mobility visit 1 Mobility  Mobility Specialist Start Time (ACUTE ONLY) 1424  Mobility Specialist Stop Time (ACUTE ONLY) 1434  Mobility Specialist Time Calculation (min) (ACUTE ONLY) 10 min    Pt was agreeable to mobility session - received in bed. C/o LE weakness and SOB. Ambulated to the BR then completed hallway ambulation without fault. Left in bed with all needs met, call bell in reach.   Maurene Capes Mobility Specialist Please contact via SecureChat or Rehab office at [phone]

## 2023-02-23 NOTE — Sedation Documentation (Signed)
Pt endorsing intermittent feelings of dizziness and stated feeling "too tired to take another breath". Pt encouraged to continue breathing with NRB for more oxygen by this RN. MD Suttle aware.

## 2023-02-23 NOTE — Progress Notes (Signed)
Mobility Specialist: Progress Note   02/23/23 1250  Mobility  Activity Ambulated with assistance to bathroom  Level of Assistance Standby assist, set-up cues, supervision of patient - no hands on  Assistive Device None  Distance Ambulated (ft) 25 ft  Activity Response Tolerated well  Mobility Referral Yes  Mobility visit 1 Mobility  Mobility Specialist Start Time (ACUTE ONLY) 1057  Mobility Specialist Stop Time (ACUTE ONLY) 1106  Mobility Specialist Time Calculation (min) (ACUTE ONLY) 9 min    Pt requested to use the BR - received in bed. SV throughout. Peri care done ind. Feeling SOB and fatigue when she returned back to bed. Left in bed with all needs met, call bell in reach.   Maurene Capes Mobility Specialist Please contact via SecureChat or Rehab office at [phone]

## 2023-02-23 NOTE — Procedures (Signed)
Interventional Radiology Procedure Note  Procedure:  1) TIPS thrombectomy 2) Main portal, SMV, IMV, and splenic venous mechanical and aspiration thrombectomy 3) Balloon angioplasty of the SMV and splenic vein  Findings: Please refer to procedural dictation for full description.  Restoration of flow within TIPS and main portal vein, splenic vein, and SMV.  Several areas of chronic scarring/thrombus remaining.    Complications: None immediate  Estimated Blood Loss: 100 mL  Recommendations: Recommend resuming anticoagulation, if tolerated, to promote patency of portal system. IR will follow.   Marliss Coots, MD Pager: [phone]

## 2023-02-23 NOTE — Progress Notes (Signed)
PHARMACY - ANTICOAGULATION CONSULT NOTE  Pharmacy Consult for heparin infusion (PTA lovenox) Indication: history of pulmonary embolus  Allergies  Allergen Reactions   Other Hives and Other (See Comments)    Cherry wood just cut- "smelled it and broke out"; cannot tolerate ANY cherry fragrances, either      Cherry Hives   Wound Dressing Adhesive Rash and Other (See Comments)    Band-Aids = local reaction    Patient Measurements: Height: 5' 2.5" (158.8 cm) Weight: 65.3 kg (143 lb 15.4 oz) IBW/kg (Calculated) : 51.25 Heparin Dosing Weight: 59 kg  Vital Signs: Temp: 98.1 F (36.7 C) (01/29 1728) Temp Source: Oral (01/29 0759) BP: 115/70 (01/29 1728) Pulse Rate: 91 (01/29 1728)  Labs: Recent Labs    02/21/23 0054 02/21/23 1059 02/22/23 0322 02/23/23 0311  HGB 7.5* 8.1* 8.0* 8.0*  HCT 23.6* 24.9* 24.7* 25.1*  PLT 175  --  190 192  LABPROT  --   --   --  14.7  INR  --   --   --  1.1  CREATININE 0.92  --  0.83 0.87    Estimated Creatinine Clearance: 47.1 mL/min (by C-G formula based on SCr of 0.87 mg/dL).   Medical History: Past Medical History:  Diagnosis Date   Anxiety    Arthritis    Depression    Dysrhythmia    hx palpitations greater than 5 yrs -neg echo, stress per pt ? where or dr   GERD (gastroesophageal reflux disease)    occ   Nausea & vomiting 08/24/2021   Pancreatic adenocarcinoma (HCC)    Pancreatic pseudocyst    Port-A-Cath in place 09/15/2021   Pulmonary embolism (HCC)    September 2022    Medications:  Medications Prior to Admission  Medication Sig Dispense Refill Last Dose/Taking   acetaminophen (TYLENOL) 500 MG tablet Take 2 tablets (1,000 mg total) by mouth every 8 (eight) hours as needed for moderate pain, headache or fever. (Patient taking differently: Take 750-1,000 mg by mouth every 8 (eight) hours as needed for moderate pain (pain score 4-6), headache or fever.)   02/16/2023   buPROPion (WELLBUTRIN XL) 300 MG 24 hr tablet Take 300  mg by mouth daily after breakfast.   02/17/2023   busPIRone (BUSPAR) 10 MG tablet Take 20 mg by mouth 2 (two) times daily.   02/17/2023 Morning   calcium carbonate (TUMS - DOSED IN MG ELEMENTAL CALCIUM) 500 MG chewable tablet Chew 1 tablet by mouth daily as needed for indigestion or heartburn.   Past Month   Cholecalciferol (VITAMIN D3) 125 MCG (5000 UT) CAPS Take 10,000 Units by mouth every morning.   02/17/2023   enoxaparin (LOVENOX) 60 MG/0.6ML injection Inject 0.6 mLs (60 mg total) into the skin every 12 (twelve) hours. 36 mL 0 02/17/2023 Morning   escitalopram (LEXAPRO) 20 MG tablet Take 20 mg by mouth at bedtime.   02/16/2023   lactose free nutrition (BOOST) LIQD Take 237 mLs by mouth 2 (two) times daily between meals.   02/17/2023 Morning   LINZESS 72 MCG capsule TAKE 1 CAPSULE BY MOUTH DAILY BEFORE BREAKFAST (Patient taking differently: Take 72 mcg by mouth daily before breakfast.) 30 capsule 0 02/17/2023 Morning   mirtazapine (REMERON) 30 MG tablet Take 15 mg by mouth at bedtime.   02/16/2023   pantoprazole (PROTONIX) 40 MG tablet Take 1 tablet (40 mg total) by mouth 2 (two) times daily. 180 tablet 0 02/17/2023 Morning   QUEtiapine (SEROQUEL) 25 MG tablet Take 25 mg  by mouth 2 (two) times daily.   02/17/2023 Morning   sucralfate (CARAFATE) 1 g tablet Take 1 tablet (1 g total) by mouth 4 (four) times daily -  with meals and at bedtime for 14 days. 56 tablet 0 02/17/2023 Morning   lidocaine-prilocaine (EMLA) cream Apply a small amount to port a cath site (do not rub in) and cover with plastic wrap 1 hour prior to infusion appointments (Patient not taking: Reported on 02/17/2023) 30 g 3 Not Taking   ondansetron (ZOFRAN) 4 MG tablet Take 1 tablet (4 mg total) by mouth every 6 (six) hours as needed for nausea or vomiting. (Patient not taking: Reported on 02/17/2023) 30 tablet 0 Not Taking    Assessment: 39 YOF s/p TIPS thrombectomy (completed 1/29 ~1600). Consult order received to start heparin infusion  without a bolus for history of PE. Her last dose of Lovenox was on 1/23 AM dose. She does have a history of being on Apixaban but last 30 day supply was from 11/2022. No doses taken recently.   Goal of Therapy:  Heparin level 0.3-0.7 units/ml Monitor platelets by anticoagulation protocol: Yes   Plan:  Start heparin infusion at 950 units/hr Check anti-Xa level in 8 hours and daily while on heparin Continue to monitor H&H and platelets  Brihanna Devenport BS, PharmD, BCPS Clinical Pharmacist 02/23/2023 6:03 PM  Contact: [phone] after 3 PM  "Be curious, not judgmental..." -Debbora Dus

## 2023-02-24 ENCOUNTER — Inpatient Hospital Stay: Payer: Medicare Other | Admitting: Hematology

## 2023-02-24 DIAGNOSIS — K31819 Angiodysplasia of stomach and duodenum without bleeding: Secondary | ICD-10-CM | POA: Diagnosis not present

## 2023-02-24 DIAGNOSIS — I81 Portal vein thrombosis: Secondary | ICD-10-CM | POA: Diagnosis not present

## 2023-02-24 DIAGNOSIS — K922 Gastrointestinal hemorrhage, unspecified: Secondary | ICD-10-CM | POA: Diagnosis not present

## 2023-02-24 DIAGNOSIS — K55069 Acute infarction of intestine, part and extent unspecified: Secondary | ICD-10-CM | POA: Diagnosis not present

## 2023-02-24 DIAGNOSIS — C250 Malignant neoplasm of head of pancreas: Secondary | ICD-10-CM | POA: Diagnosis not present

## 2023-02-24 LAB — RETICULOCYTES
Immature Retic Fract: 35.2 % — ABNORMAL HIGH (ref 2.3–15.9)
RBC.: 2.62 MIL/uL — ABNORMAL LOW (ref 3.87–5.11)
Retic Count, Absolute: 39.8 10*3/uL (ref 19.0–186.0)
Retic Ct Pct: 1.5 % (ref 0.4–3.1)

## 2023-02-24 LAB — CBC
HCT: 24.2 % — ABNORMAL LOW (ref 36.0–46.0)
Hemoglobin: 7.6 g/dL — ABNORMAL LOW (ref 12.0–15.0)
MCH: 28.8 pg (ref 26.0–34.0)
MCHC: 31.4 g/dL (ref 30.0–36.0)
MCV: 91.7 fL (ref 80.0–100.0)
Platelets: 175 10*3/uL (ref 150–400)
RBC: 2.64 MIL/uL — ABNORMAL LOW (ref 3.87–5.11)
RDW: 17.8 % — ABNORMAL HIGH (ref 11.5–15.5)
WBC: 3.8 10*3/uL — ABNORMAL LOW (ref 4.0–10.5)
nRBC: 0 % (ref 0.0–0.2)

## 2023-02-24 LAB — IRON AND TIBC
Iron: 22 ug/dL — ABNORMAL LOW (ref 28–170)
Saturation Ratios: 11 % (ref 10.4–31.8)
TIBC: 210 ug/dL — ABNORMAL LOW (ref 250–450)
UIBC: 188 ug/dL

## 2023-02-24 LAB — HEPARIN LEVEL (UNFRACTIONATED)
Heparin Unfractionated: 0.4 [IU]/mL (ref 0.30–0.70)
Heparin Unfractionated: 0.36 [IU]/mL (ref 0.30–0.70)

## 2023-02-24 LAB — VITAMIN B12: Vitamin B-12: 1326 pg/mL — ABNORMAL HIGH (ref 180–914)

## 2023-02-24 LAB — FERRITIN: Ferritin: 112 ng/mL (ref 11–307)

## 2023-02-24 LAB — FOLATE: Folate: 19.4 ng/mL (ref >5.9)

## 2023-02-24 NOTE — Progress Notes (Signed)
PHARMACY - ANTICOAGULATION CONSULT NOTE  Pharmacy Consult for heparin infusion (PTA lovenox) Indication: history of pulmonary embolus  Allergies  Allergen Reactions   Other Hives and Other (See Comments)    Cherry wood just cut- "smelled it and broke out"; cannot tolerate ANY cherry fragrances, either      Cherry Hives   Wound Dressing Adhesive Rash and Other (See Comments)    Band-Aids = local reaction    Patient Measurements: Height: 5' 2.5" (158.8 cm) Weight: 65.3 kg (143 lb 15.4 oz) IBW/kg (Calculated) : 51.25 Heparin Dosing Weight: 59 kg  Vital Signs: Temp: 98.2 F (36.8 C) (01/30 1601) Temp Source: Oral (01/30 1601) BP: 113/57 (01/30 1601) Pulse Rate: 91 (01/30 1601)  Labs: Recent Labs    02/22/23 0322 02/23/23 0311 02/24/23 0347 02/24/23 1530  HGB 8.0* 8.0* 7.6*  --   HCT 24.7* 25.1* 24.2*  --   PLT 190 192 175  --   LABPROT  --  14.7  --   --   INR  --  1.1  --   --   HEPARINUNFRC  --   --  0.40 0.36  CREATININE 0.83 0.87  --   --     Estimated Creatinine Clearance: 47.1 mL/min (by C-G formula based on SCr of 0.87 mg/dL).  Assessment: 34 YOF admitted 02/17/23 with bright red blood per rectum x1 episode. Patient was recently hospitalized from 1/12 to 1/19 with GI bleed and SMV vein thrombosis and underwent TIPS with aspiration of thrombosis and SMV angioplasty plus embolization of left gastric vein by IR 1/15 and was discharged on 1 month of Lovenox with a plan to eventually transition to Eliquis. Last dose of Lovenox was on 02/17/23. Patient is now status post TIPS thrombectomy (completed 1/29 ~1600; estimated blood loss 100 mL). Consult order received to start heparin infusion without a bolus for history of PE.   Heparin level remains therapeutic (0.36) on infusion at 950 units/hr. No bleeding noted  Goal of Therapy:  Heparin level 0.3-0.7 units/ml Monitor platelets by anticoagulation protocol: Yes   Plan:  Continue heparin 950 units/hr.  Daiy heparin  level and CBC Follow-up plans for oral anticoagulant.   Christoper Fabian, PharmD, BCPS Please see amion for complete clinical pharmacist phone list 02/24/2023 4:23 PM

## 2023-02-24 NOTE — Progress Notes (Signed)
Referring Physician(s): Mike Gip, PA-C   Supervising Physician: Marliss Coots  Patient Status:  [facility] - In-pt  Chief Complaint:  Pancreatic CA, recurrent UGIB and main portal thrombus  S/p TIPS approach main portal vein and SMV drug coated balloon angioplasty and embolization of left gastric vein 1/15 by Dr. Elby Showers  S/p TIPS, main portal, SMV, IMV, and splenic vein thrombectomy and angioplasty of the SMV and splenic vein on 1/29 by Dr. Elby Showers   Subjective:  Sitting in bed, NAD.  No complaints today, states that the procedure was rough as she was awake the whole time.   Allergies: Other, Cherry, and Wound dressing adhesive  Medications: Prior to Admission medications   Medication Sig Start Date End Date Taking? Authorizing Provider  acetaminophen (TYLENOL) 500 MG tablet Take 2 tablets (1,000 mg total) by mouth every 8 (eight) hours as needed for moderate pain, headache or fever. Patient taking differently: Take 750-1,000 mg by mouth every 8 (eight) hours as needed for moderate pain (pain score 4-6), headache or fever. 07/21/21  Yes Vassie Loll, MD  buPROPion (WELLBUTRIN XL) 300 MG 24 hr tablet Take 300 mg by mouth daily after breakfast. 06/15/21  Yes [provider]  busPIRone (BUSPAR) 10 MG tablet Take 20 mg by mouth 2 (two) times daily. 01/27/21  Yes [provider]  calcium carbonate (TUMS - DOSED IN MG ELEMENTAL CALCIUM) 500 MG chewable tablet Chew 1 tablet by mouth daily as needed for indigestion or heartburn.   Yes [provider]  Cholecalciferol (VITAMIN D3) 125 MCG (5000 UT) CAPS Take 10,000 Units by mouth every morning.   Yes [provider]  enoxaparin (LOVENOX) 60 MG/0.6ML injection Inject 0.6 mLs (60 mg total) into the skin every 12 (twelve) hours. 02/12/23  Yes Leroy Sea, MD  escitalopram (LEXAPRO) 20 MG tablet Take 20 mg by mouth at bedtime.   Yes [provider]  lactose free nutrition (BOOST) LIQD Take 237 mLs  by mouth 2 (two) times daily between meals.   Yes [provider]  LINZESS 72 MCG capsule TAKE 1 CAPSULE BY MOUTH DAILY BEFORE BREAKFAST Patient taking differently: Take 72 mcg by mouth daily before breakfast. 01/20/23  Yes Mansouraty, Netty Starring., MD  mirtazapine (REMERON) 30 MG tablet Take 15 mg by mouth at bedtime.   Yes [provider]  pantoprazole (PROTONIX) 40 MG tablet Take 1 tablet (40 mg total) by mouth 2 (two) times daily. 02/02/23 05/03/23 Yes Carollee Herter, DO  QUEtiapine (SEROQUEL) 25 MG tablet Take 25 mg by mouth 2 (two) times daily. 12/14/22  Yes [provider]  sucralfate (CARAFATE) 1 g tablet Take 1 tablet (1 g total) by mouth 4 (four) times daily -  with meals and at bedtime for 14 days. 02/12/23 02/26/23 Yes Leroy Sea, MD  lidocaine-prilocaine (EMLA) cream Apply a small amount to port a cath site (do not rub in) and cover with plastic wrap 1 hour prior to infusion appointments Patient not taking: Reported on 02/17/2023 09/15/21   Doreatha Massed, MD  ondansetron (ZOFRAN) 4 MG tablet Take 1 tablet (4 mg total) by mouth every 6 (six) hours as needed for nausea or vomiting. Patient not taking: Reported on 02/17/2023 02/02/23 03/04/23  Carollee Herter, DO     Vital Signs: BP 116/66 (BP Location: Right Arm)   Pulse 85   Temp 98 F (36.7 C)   Resp 18   Ht 5' 2.5" (1.588 m)   Wt 143 lb 15.4 oz (65.3  kg)   SpO2 90%   BMI 25.91 kg/m   Physical Exam Constitutional:      General: She is not in acute distress.    Appearance: She is not ill-appearing.  HENT:     Head: Normocephalic and atraumatic.  Neck:     Comments: Dressing on left internal jugular puncture site. Site is tender, no ecchymosis or active bleeding.  Pulmonary:     Effort: Pulmonary effort is normal.  Abdominal:     General: Abdomen is flat.     Palpations: Abdomen is soft.  Musculoskeletal:     Cervical back: Neck supple.  Skin:    General: Skin is warm and dry.     Coloration:  Skin is not jaundiced or pale.  Neurological:     Mental Status: She is alert and oriented to person, place, and time.  Psychiatric:        Mood and Affect: Mood normal.        Behavior: Behavior normal.     Imaging: IR THROMBECT SEC MECH MOD SED Result Date: 02/23/2023 CLINICAL DATA:  78 year old female with history of pancreatic cancer status post radiation with development of progressive acute portal vein thrombus and recurrent gastric hemorrhage status post portal thrombectomy and TIPS placement on 02/09/2023 presenting with recurrent gastric bleeding and imaging evidence of TIPS occlusion. EXAM: 1. Ultrasound-guided vascular access to the left internal jugular vein. 2. Selective catheterization and venography of the inferior mesenteric vein, superior mesenteric vein, and splenic veins via the indwelling TIPS 3. Mechanical and aspiration thrombectomy of the TIPS, main portal vein, and central superior mesenteric, inferior mesenteric, and splenic veins. 4. Balloon angioplasty of the central superior mesenteric vein and splenic vein. MEDICATIONS: 8000 units heparin, intravenous ANESTHESIA/SEDATION: Moderate (conscious) sedation was employed during this procedure. A total of Versed 1 mg and Fentanyl 200 mcg was administered intravenously. Moderate Sedation Time: 72 minutes. The patient's level of consciousness and vital signs were monitored continuously by radiology nursing throughout the procedure under my direct supervision. CONTRAST:  65 mL Omnipaque 300, intravenous FLUOROSCOPY TIME:  One hundred ninety mGy COMPLICATIONS: None immediate. PROCEDURE: Informed written consent was obtained from the patient after a thorough discussion of the procedural risks, benefits and alternatives. All questions were addressed. Maximal Sterile Barrier Technique was utilized including caps, mask, sterile gowns, sterile gloves, sterile drape, hand hygiene and skin antiseptic. A timeout was performed prior to the  initiation of the procedure. Preprocedure ultrasound evaluation demonstrated patency of the left internal jugular vein. The procedure was planned. Subdermal Local anesthesia was administered with 1% lidocaine at the planned needle entry site. A small skin nick was made. Under direct ultrasound visualization, the left internal jugular vein was accessed with a 21 gauge micropuncture needle. A permanent ultrasound image was captured and stored in the record. A micropuncture sheath was introduced through which a Rosen wire was directed to the inferior vena cava. A 6 French destination sheath was then inserted and positioned in the right atrium. And in the a catheter was then directed to the hepatic vein ostium of the indwelling TIPS which was cannulated with moderate difficulty. A Rosen wire was then inserted and advanced level of the inferior mesenteric vein. Anatomy cross catheter was then placed over the wire and directed into the mid main inferior mesenteric vein. The wire was removed and inferior mesenteric venogram was performed which min straight it hepatofugal flow with occlusion of the inferior mesenteric vein centrally. The 6 French sheath was exchanged for a  16 French, 33 cm dry seal sheath. The sheath was advanced to the hepatic vein aspect of the indwelling endograft. Mechanical thrombectomy was then performed with the Revcore device through the central inferior mesenteric vein, main portal vein, and indwelling endograft. This was followed by aspiration thrombectomy with a straight 16 [place] flowtriever. There was small volume of chronic appearing thrombus aspirated. Balloon angioplasty was then performed throughout the TIPS and into the main portal vein with a 10 mm x 80 mm Athletis balloon. Portal venogram was then performed which demonstrated an established channel a patency through the main portal vein and into the inferior mesenteric vein. A small aspect of the central superior mesenteric vein is  identified. The Navicross catheter and Glidewire intra then used to cannulate the superior mesenteric vein. Mechanical thrombectomy was performed with the Revcore device through the central superior mesenteric vein into the main portal vein. Balloon angioplasty was then performed with a 6 mm x 80 mm Athletis balloon. Super mesenteric venogram was then performed which demonstrated significantly improved patency and hepatopetal flow through the superior mesenteric vein, main portal vein, and indwelling endograft. There is no evidence of reflux into the splenic vein. Therefore, the splenic vein was then cannulated and balloon angioplasty was performed with a 10 mm x 80 mm Athletis balloon. There is a type least near the portal confluence. Completion splenic venogram demonstrated 6 improved patency inflow the splenic vein however still subchronic synechia centrally. There was a filling defect about the hepatic vein aspect of the indwelling endograft. Therefore, additional balloon angioplasty was performed centered about the hepatic vein aspect of the indwelling endograft. Completion venogram demonstrated significantly improved patency and antegrade flow through the indwelling endograft main portal vein. 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 The catheter and sheath were removed. Hemostasis was achieved with brief manual compression. A sterile bandage was applied. The patient tolerated the procedure well was transferred back to the floor in good condition. IMPRESSION: 1. Successful mechanical and aspiration thrombectomy of the TIPS, main portal vein, and central superior mesenteric, inferior mesenteric, and splenic veins. 2. Balloon angioplasty of the central superior mesenteric vein, splenic vein, and hepatic vein aspect of the indwelling endograft. 3. Completion venogram demonstrated significantly improved patency and antegrade flow through the indwelling endograft, main portal vein, and central superior  mesenteric vein. 4. 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 PLAN: Recommend restarting systemic anticoagulation to preserve portal and TIPS patency. Interventional radiology will follow. Marliss Coots, MD [facility] Radiology Electronically Signed   By: Marliss Coots M.D.   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 EXAM: 1. Ultrasound-guided vascular access to the left internal jugular vein. 2. Selective catheterization and venography of the inferior mesenteric vein, superior mesenteric vein, and splenic veins via the indwelling TIPS 3. Mechanical and aspiration thrombectomy of the TIPS, main portal vein, and central superior mesenteric, inferior mesenteric, and splenic veins. 4. Balloon angioplasty of the central superior mesenteric vein and splenic vein. MEDICATIONS: 8000 units heparin, intravenous ANESTHESIA/SEDATION: Moderate (conscious) sedation was employed during this procedure. A total of Versed 1 mg and Fentanyl 200 mcg was administered intravenously. Moderate Sedation Time: 72 minutes. The patient's level  of consciousness and vital signs were monitored continuously by radiology nursing throughout the procedure under my direct supervision. CONTRAST:  65 mL Omnipaque 300, intravenous FLUOROSCOPY TIME:  One hundred ninety mGy COMPLICATIONS: None immediate. PROCEDURE: Informed written consent was obtained from the patient after a thorough discussion of the procedural risks, benefits and alternatives. All questions were addressed. Maximal Sterile Barrier Technique was utilized including  caps, mask, sterile gowns, sterile gloves, sterile drape, hand hygiene and skin antiseptic. A timeout was performed prior to the initiation of the procedure. Preprocedure ultrasound evaluation demonstrated patency of the left internal jugular vein. The procedure was planned. Subdermal Local anesthesia was administered with 1% lidocaine at the planned needle entry site. A small skin nick was made. Under direct ultrasound visualization, the left internal jugular vein was accessed with a 21 gauge micropuncture needle. A permanent ultrasound image was captured and stored in the record. A micropuncture sheath was introduced through which a Rosen wire was directed to the inferior vena cava. A 6 French destination sheath was then inserted and positioned in the right atrium. And in the a catheter was then directed to the hepatic vein ostium of the indwelling TIPS which was cannulated with moderate difficulty. A Rosen wire was then inserted and advanced level of the inferior mesenteric vein. Anatomy cross catheter was then placed over the wire and directed into the mid main inferior mesenteric vein. The wire was removed and inferior mesenteric venogram was performed which min straight it hepatofugal flow with occlusion of the inferior mesenteric vein centrally. The 6 French sheath was exchanged for a 16 French, 33 cm dry seal sheath. The sheath was advanced to the hepatic vein aspect of the indwelling endograft. Mechanical thrombectomy was then performed with the Revcore device through the central inferior mesenteric vein, main portal vein, and indwelling endograft. This was followed by aspiration thrombectomy with a straight 16 [place] flowtriever. There was small volume of chronic appearing thrombus aspirated. Balloon angioplasty was then performed throughout the TIPS and into the main portal vein with a 10 mm x 80 mm Athletis balloon. Portal venogram was then performed which demonstrated an established channel a patency  through the main portal vein and into the inferior mesenteric vein. A small aspect of the central superior mesenteric vein is identified. The Navicross catheter and Glidewire intra then used to cannulate the superior mesenteric vein. Mechanical thrombectomy was performed with the Revcore device through the central superior mesenteric vein into the main portal vein. Balloon angioplasty was then performed with a 6 mm x 80 mm Athletis balloon. Super mesenteric venogram was then performed which demonstrated significantly improved patency and hepatopetal flow through the superior mesenteric vein, main portal vein, and indwelling endograft. There is no evidence of reflux into the splenic vein. Therefore, the splenic vein was then cannulated and balloon angioplasty was performed with a 10 mm x 80 mm Athletis balloon. There is a type least near the portal confluence. Completion splenic venogram demonstrated 6 improved patency inflow the splenic vein however still subchronic synechia centrally. There was a filling defect about the hepatic vein aspect of the indwelling endograft. Therefore, additional balloon angioplasty was performed centered about the hepatic vein aspect of the indwelling endograft. Completion venogram demonstrated significantly improved patency and antegrade flow through the indwelling endograft main portal vein. 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 The catheter and sheath were removed. Hemostasis was achieved with brief manual compression. A sterile bandage was applied. The patient tolerated the procedure well was transferred  back to the floor in good condition. IMPRESSION: 1. Successful mechanical and aspiration thrombectomy of the TIPS, main portal vein, and central superior mesenteric, inferior mesenteric, and splenic veins. 2. Balloon angioplasty of the central superior mesenteric vein, splenic vein, and hepatic vein aspect of the indwelling endograft. 3. Completion venogram demonstrated  significantly improved patency and antegrade flow through the indwelling endograft, main portal vein, and central superior mesenteric vein. 4. 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 PLAN: Recommend restarting systemic anticoagulation to preserve portal and TIPS patency. Interventional radiology will follow. Marliss Coots, MD [facility] Radiology Electronically Signed   By: Marliss Coots M.D.   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 EXAM: 1. Ultrasound-guided vascular access to the left internal jugular vein. 2. Selective catheterization and venography of the inferior mesenteric vein, superior mesenteric vein, and splenic veins via the indwelling TIPS 3. Mechanical and aspiration thrombectomy of the TIPS, main portal vein, and central superior mesenteric, inferior mesenteric, and splenic veins. 4. Balloon angioplasty of the central superior mesenteric vein and splenic vein. MEDICATIONS: 8000 units heparin, intravenous ANESTHESIA/SEDATION: Moderate (conscious) sedation was employed during this procedure. A total of Versed 1 mg and Fentanyl 200 mcg was administered intravenously. Moderate Sedation Time: 72 minutes. The patient's level of consciousness and vital signs were monitored continuously by radiology nursing throughout the procedure under my direct supervision. CONTRAST:  65 mL Omnipaque 300, intravenous FLUOROSCOPY TIME:  One hundred ninety mGy COMPLICATIONS: None immediate. PROCEDURE: Informed written consent was obtained from the patient after a thorough discussion of the procedural  risks, benefits and alternatives. All questions were addressed. Maximal Sterile Barrier Technique was utilized including caps, mask, sterile gowns, sterile gloves, sterile drape, hand hygiene and skin antiseptic. A timeout was performed prior to the initiation of the procedure. Preprocedure ultrasound evaluation demonstrated patency of the left internal jugular vein. The procedure was planned. Subdermal Local anesthesia was administered with 1% lidocaine at the planned needle entry site. A small skin nick was made. Under direct ultrasound visualization, the left internal jugular vein was accessed with a 21 gauge micropuncture needle. A permanent ultrasound image was captured and stored in the record. A micropuncture sheath was introduced through which a Rosen wire was directed to the inferior vena cava. A 6 French destination sheath was then inserted and positioned in the right atrium. And in the a catheter was then directed to the hepatic vein ostium of the indwelling TIPS which was cannulated with moderate difficulty. A Rosen wire was then inserted and advanced level of the inferior mesenteric vein. Anatomy cross catheter was then placed over the wire and directed into the mid main inferior mesenteric vein. The wire was removed and inferior mesenteric venogram was performed which min straight it hepatofugal flow with occlusion of the inferior mesenteric vein centrally. The 6 French sheath was exchanged for a 16 French, 33 cm dry seal sheath. The sheath was advanced to the hepatic vein aspect of the indwelling endograft. Mechanical thrombectomy was then performed with the Revcore device through the central inferior mesenteric vein, main portal vein, and indwelling endograft. This was followed by aspiration thrombectomy with a straight 16 [place] flowtriever. There was small volume  of chronic appearing thrombus aspirated. Balloon angioplasty was then performed throughout the TIPS and into the main portal vein with a  10 mm x 80 mm Athletis balloon. Portal venogram was then performed which demonstrated an established channel a patency through the main portal vein and into the inferior mesenteric vein. A small aspect of the central superior mesenteric vein is identified. The Navicross catheter and Glidewire intra then used to cannulate the superior mesenteric vein. Mechanical thrombectomy was performed with the Revcore device through the central superior mesenteric vein into the main portal vein. Balloon angioplasty was then performed with a 6 mm x 80 mm Athletis balloon. Super mesenteric venogram was then performed which demonstrated significantly improved patency and hepatopetal flow through the superior mesenteric vein, main portal vein, and indwelling endograft. There is no evidence of reflux into the splenic vein. Therefore, the splenic vein was then cannulated and balloon angioplasty was performed with a 10 mm x 80 mm Athletis balloon. There is a type least near the portal confluence. Completion splenic venogram demonstrated 6 improved patency inflow the splenic vein however still subchronic synechia centrally. There was a filling defect about the hepatic vein aspect of the indwelling endograft. Therefore, additional balloon angioplasty was performed centered about the hepatic vein aspect of the indwelling endograft. Completion venogram demonstrated significantly improved patency and antegrade flow through the indwelling endograft main portal vein. 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 The catheter and sheath were removed. Hemostasis was achieved with brief manual compression. A sterile bandage was applied. The patient tolerated the procedure well was transferred back to the floor in good condition. IMPRESSION: 1. Successful mechanical and aspiration thrombectomy of the TIPS, main portal vein, and central superior mesenteric, inferior mesenteric, and splenic veins. 2. Balloon angioplasty of the central superior  mesenteric vein, splenic vein, and hepatic vein aspect of the indwelling endograft. 3. Completion venogram demonstrated significantly improved patency and antegrade flow through the indwelling endograft, main portal vein, and central superior mesenteric vein. 4. 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 PLAN: Recommend restarting systemic anticoagulation to preserve portal and TIPS patency. Interventional radiology will follow. Marliss Coots, MD [facility] Radiology Electronically Signed   By: Marliss Coots M.D.   On: 02/23/2023 21:22    Labs:  CBC: Recent Labs    02/21/23 0054 02/21/23 1059 02/22/23 0322 02/23/23 0311 02/24/23 0347  WBC 5.4  --  3.8* 2.9* 3.8*  HGB 7.5* 8.1* 8.0* 8.0* 7.6*  HCT 23.6* 24.9* 24.7* 25.1* 24.2*  PLT 175  --  190 192 175    COAGS: Recent Labs    12/27/22 1923 01/29/23 1137 02/09/23 0404 02/18/23 0627 02/23/23 0311  INR 1.1 0.9 1.1 1.1 1.1  APTT 58*  --   --  29  --     BMP: Recent Labs    02/20/23 0420 02/21/23 0054 02/22/23 0322 02/23/23 0311  NA 142 136 140 139  K 3.5 3.3* 3.2* 3.9  CL 113* 111 113* 112*  CO2 21* 20* 21* 19*  GLUCOSE 99 117* 124* 113*  BUN 10 6* 6* 6*  CALCIUM 7.7* 7.6* 8.1* 8.2*  CREATININE 0.88 0.92 0.83 0.87  GFRNONAA >60 >60 >60 >60    LIVER FUNCTION TESTS: Recent Labs    02/13/23 0345 02/17/23 1103 02/17/23 1735 02/23/23 0311  BILITOT 0.4 0.7 0.4 0.4  AST 27 40 50* 20  ALT 40 46* 52* 21  ALKPHOS 77 98 104 83  PROT 4.7* 5.6* 5.7*  4.8*  ALBUMIN 2.2* 2.8* 2.7* 2.0*    Assessment and Plan:  78 y.o. female with Pancreatic CA, recurrent UGIB and main portal thrombus.  S/p TIPS approach main portal vein and SMV drug coated balloon angioplasty and embolization of left gastric vein 1/15 by Dr. Elby Showers, presented to ED on 02/17/23 with rectal bleeding while on Lovenox, found to have ccluded TIPS as well as portal vein and central superior mesenteric  vein occlusions, EGD showed multiple cratered gastric ulcers, two with adherent clot, one ulcer was clipped and another was cauterized, S/p TIPS, main portal, SMV, IMV, and splenic vein thrombectomy and angioplasty of the SMV and splenic vein on 1/29 by Dr. Elby Showers  - patient on heparin infusion   VSS CBC stable  On heparin infusion L internal jugular puncture site tender but no ecchymosis or active bleeding.   PLAN - Continue heparin infusion and monitor H/H closely  - CT BRTO protocol tomorrow  Further treatment plan per TRH/GI/oncology Appreciate and agree with the plan.  IR to follow.    Electronically Signed: Willette Brace, PA-C 02/24/2023, 1:43 PM   I spent a total of 15 Minutes at the the patient's bedside AND on the patient's hospital floor or unit, greater than 50% of which was counseling/coordinating care for pancreatic CA, recurrent GIB due to portal thrombosis s/p TIPS approach thrombectomy x 2.   This chart was dictated using voice recognition software.  Despite best efforts to proofread,  errors can occur which can change the documentation meaning.

## 2023-02-24 NOTE — Progress Notes (Signed)
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 Admission Hg 9.6 down from 8.2.  Prior EGD by Dr. 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  EGD 02/06/2022 showed 5 nonbleeding cratered gastric ulcers clean-based likely secondary to prior APC treatment. EGD during this admission 02/18/2023 showed multiple cratered gastric ulcers 2 with adherent clot in the prepylorus largest 8 mm clip laced on the ulcer furthest from the pylorus the 1 closer to the pylorus was treated with coagulation for noted severe GAVE fundal varices.  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  Abdominal MRI w/wo contrast 1/23 showed no change in the ill-defined infiltrative soft tissue mass in the central pancreatic head 2.8 x 1.9 cm. TIPS occluded as well as occlusion of the portal veins and central SMV.  Small volume ascites.  Lovenox on hold. Off chemo x 3 months. Not a candidate for Whipple's procedure.  History:  Since I last saw this patient, she underwent aspiration thrombectomy and balloon angioplasty of her TIPS with IR yesterday. After some chat discussions, decision was made to start unfractionated heparin.  She believes her stool is had some reddish color, but when she asked nursing to look at it, they reportedly did not see any blood in it. No hematemesis Minimal abdominal pain, able to tolerate regular food but did much like the lunch that was brought to her today.  ROS: Cardiovascular: No chest pain Respiratory: Reports that her breathing is comfortable Urinary: Denies dysuria  Objective:   Current Facility-Administered Medications:    acetaminophen (TYLENOL) tablet 650 mg, 650 mg,  Oral, Q6H PRN, 650 mg at 02/24/23 0537 **OR** acetaminophen (TYLENOL) suppository 650 mg, 650 mg, Rectal, Q6H PRN, Nolberto Hanlon, MD   buPROPion (WELLBUTRIN XL) 24 hr tablet 300 mg, 300 mg, Oral, QPC breakfast, Nolberto Hanlon, MD, 300 mg at 02/24/23 0933   busPIRone (BUSPAR) tablet 20 mg, 20 mg, Oral, BID, Nolberto Hanlon, MD, 20 mg at 02/24/23 5284   calcium carbonate (TUMS - dosed in mg elemental calcium) chewable tablet 200 mg of elemental calcium, 1 tablet, Oral, Daily PRN, Nolberto Hanlon, MD   Chlorhexidine Gluconate Cloth 2 % PADS 6 each, 6 each, Topical, Daily, Nolberto Hanlon, MD, 6 each at 02/24/23 0940   cholecalciferol (VITAMIN D3) 25 MCG (1000 UNIT) tablet 10,000 Units, 10,000 Units, Oral, q morning, Nolberto Hanlon, MD, 10,000 Units at 02/24/23 0933   escitalopram (LEXAPRO) tablet 20 mg, 20 mg, Oral, QHS, Nolberto Hanlon, MD, 20 mg at 02/23/23 2113   guaiFENesin (MUCINEX) 12 hr tablet 600 mg, 600 mg, Oral, BID, Alanda Slim, Taye T, MD, 600 mg at 02/24/23 0933   guaiFENesin-dextromethorphan (ROBITUSSIN DM) 100-10 MG/5ML syrup 5 mL, 5 mL, Oral, Q4H PRN, Alanda Slim, Taye T, MD   heparin ADULT infusion 100 units/mL (25000 units/249mL), 950 Units/hr, Intravenous, Continuous, Reome, Earle J, RPH, Last Rate: 9.5 mL/hr at 02/23/23 2103, 950 Units/hr at 02/23/23 2103   iohexol (OMNIPAQUE) 300 MG/ML solution 150 mL, 150 mL, Intravenous, Once PRN, Suttle, Thressa Sheller, MD   loratadine (CLARITIN) tablet 10 mg, 10 mg, Oral, Daily, Gonfa, Taye T, MD, 10 mg at 02/24/23 0933   LORazepam (ATIVAN) injection 0.5 mg, 0.5 mg, Intravenous, Once PRN, Howerter, Justin B, DO   mirtazapine (REMERON) tablet 15 mg, 15  mg, Oral, QHS, Nolberto Hanlon, MD, 15 mg at 02/23/23 2024   ondansetron [facility]) injection 4 mg, 4 mg, Intravenous, Q6H PRN, Howerter, Justin B, DO, 4 mg at 02/18/23 2024   pantoprazole (PROTONIX) injection 40 mg, 40 mg, Intravenous, Q5 min **FOLLOWED BY** pantoprazole (PROTONIX) injection 40 mg, 40 mg, Intravenous, Q12H, Nolberto Hanlon, MD, 40 mg  at 02/24/23 0934   polyethylene glycol (MIRALAX / GLYCOLAX) packet 17 g, 17 g, Oral, Daily PRN, Nolberto Hanlon, MD   QUEtiapine (SEROQUEL) tablet 25 mg, 25 mg, Oral, BID, Nolberto Hanlon, MD, 25 mg at 02/24/23 0933   sodium chloride flush (NS) 0.9 % injection 10-40 mL, 10-40 mL, Intracatheter, Q12H, Nolberto Hanlon, MD, 10 mL at 02/24/23 0944   sodium chloride flush (NS) 0.9 % injection 10-40 mL, 10-40 mL, Intracatheter, PRN, Nolberto Hanlon, MD, 10 mL at 02/18/23 0045   sodium chloride flush (NS) 0.9 % injection 3 mL, 3 mL, Intravenous, Q12H, Nolberto Hanlon, MD, 3 mL at 02/21/23 2133   sucralfate (CARAFATE) tablet 1 g, 1 g, Oral, TID WC & HS, Nolberto Hanlon, MD, 1 g at 02/24/23 1302   heparin 950 Units/hr (02/23/23 2103)     Vital signs in last 24 hrs: Vitals:   02/24/23 0531 02/24/23 0807  BP: 118/68 116/66  Pulse: 89 85  Resp: 19 18  Temp: 98.5 F (36.9 C) 98 F (36.7 C)  SpO2: 90% 90%    Intake/Output Summary (Last 24 hours) at 02/24/2023 1508 Last data filed at 02/24/2023 0400 Gross per 24 hour  Intake 350.88 ml  Output --  Net 350.88 ml     Physical Exam Pleasant and conversational, sitting up comfortably in bed. HEENT: sclera anicteric, oral mucosa without lesions Neck: supple, no thyromegaly, JVD or lymphadenopathy Cardiac: RRR without murmurs, S1S2 heard, no peripheral edema Pulm: clear to auscultation bilaterally, normal RR and effort noted Abdomen: soft, no tenderness, with active bowel sounds. No guarding or palpable hepatosplenomegaly Skin; warm and dry, no jaundice, pale  Recent Labs:     Latest Ref Rng & Units 02/24/2023    3:47 AM 02/23/2023    3:11 AM 02/22/2023    3:22 AM  CBC  WBC 4.0 - 10.5 K/uL 3.8  2.9  3.8   Hemoglobin 12.0 - 15.0 g/dL 7.6  8.0  8.0   Hematocrit 36.0 - 46.0 % 24.2  25.1  24.7   Platelets 150 - 400 K/uL 175  192  190     Recent Labs  Lab 02/23/23 0311  INR 1.1      Latest Ref Rng & Units 02/23/2023    3:11 AM 02/22/2023    3:22 AM 02/21/2023    12:54 AM  CMP  Glucose 70 - 99 mg/dL 161  096  045   BUN 8 - 23 mg/dL 6  6  6    Creatinine 0.44 - 1.00 mg/dL 4.09  8.11  9.14   Sodium 135 - 145 mmol/L 139  140  136   Potassium 3.5 - 5.1 mmol/L 3.9  3.2  3.3   Chloride 98 - 111 mmol/L 112  113  111   CO2 22 - 32 mmol/L 19  21  20    Calcium 8.9 - 10.3 mg/dL 8.2  8.1  7.6   Total Protein 6.5 - 8.1 g/dL 4.8     Total Bilirubin 0.0 - 1.2 mg/dL 0.4     Alkaline Phos 38 - 126 U/L 83     AST 15 - 41 U/L 20  ALT 0 - 44 U/L 21        Radiologic studies:  IR procedure report reviewed and on file  Assessment & Plan  Assessment: 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  Thrombectomy again yesterday with TIPS angioplasty.  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  Time will tell.  She had to resume anticoagulation to hopefully decrease the chance of another thrombosis, but this clearly leads to the risk of rebleeding.  APC has not been successful thus far.  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  We are watching her hemoglobin daily and seeing whether it may hold and also allow a transition from unfractionated heparin to Lovenox.  She is on pantoprazole and Carafate  As she is proximal to me a few days ago, she is again considering what sounds like palliative care and says that her son has made some inquiries about this. That sounds appropriate given the status of her malignancy and the other complications occurring from it as well as our significantly limited ability to attend to this GI bleeding.  Charlie Pitter III Office: [phone]

## 2023-02-24 NOTE — Progress Notes (Signed)
PROGRESS NOTE  Sheryl Bender ZOX:[id number] DOB: [birth year]   PCP: [name], DO  Patient is from: Home.  Independently ambulates at baseline.  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   Patient was hospitalized 1/12-1/19 GI bleed and SMV vein thrombosis.  She underwent TIPS procedure, aspiration of portal vein thrombus and SMV balloon angioplasty along with embolization of left gastric vein by IR on 1/15 and discharged on Lovenox for 1 months with a plan to transition to Eliquis afterward.  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  She was discharged on PPI and Carafate.  Anticoagulation held.  GI and IR consulted this hospitalization.  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  EGD on 1/24 showed multiple gastric ulcers, 2 with adherent clots that were treated endoscopically, gastric antral vascular ectasia, erythematous duodenopathy and moderate hiatal hernia.  GI recommended holding anticoagulation.  IR consulted and patient had TI PC revision with aspiration thrombectomy and balloon angioplasty on 1/29.  Anticoagulation resumed with IV heparin.    Subjective: Seen and examined earlier this morning.  No major events overnight of this morning.   Noted some melanotic stool.  Also feels bloated but having bowel movements and passing gas.  Cough has improved.  H&H relatively stable after resuming IV heparin.  Objective: Vitals:   02/23/23 1801 02/23/23 2116 02/24/23 0531 02/24/23 0807  BP: 124/67 113/74 118/68 116/66  Pulse: 89 100 89 85  Resp: 20 19 19 18   Temp: 97.7 F (36.5 C) 98.3 F (36.8 C) 98.5 F (36.9 C) 98 F (36.7 C)  TempSrc: Oral     SpO2: 98% 91% 90% 90%  Weight:   65.3 kg   Height:        Examination:  GENERAL: No apparent distress.  Nontoxic. HEENT: MMM.  Vision and hearing grossly intact.  NECK: Supple.  No apparent JVD.  RESP:  No IWOB.  Fair aeration bilaterally.  CVS:  RRR. Heart sounds normal.  ABD/GI/GU: BS+. Abd soft, NTND.  MSK/EXT:  Moves extremities. No apparent deformity. No edema.  SKIN: no apparent skin lesion or wound NEURO: Awake, alert and oriented appropriately.  No apparent focal neuro deficit. PSYCH: Calm. Normal affect.   Procedures:  1/24-EGD showed multiple gastric ulcers, 2 with adherent clots that were treated endoscopically, gastric antral vascular ectasia, erythematous duodenopathy and moderate hiatal hernia.  Microbiology summarized: None  Assessment and plan: Recurrent GI bleeding: Returns with bright red blood per rectum.  Patient is on Lovenox for the PE/DVT/SMV thrombosis.  Had extensive evaluation including EGD and colonoscopy last hospitalization.  Repeat EGD on 1/24 with multiple gastric ulcers (treated endoscopically), gastric antral vascular ectasia, erythematous duodenopathy and moderate hiatal hernia.  Hgb 9.6 on admission (was 9.2 when discharge on 1/19).  H&H relatively stable now. Recent Labs    02/19/23  8413 02/19/23 0956 02/19/23 1555 02/19/23 2247 02/20/23 0420 02/21/23 0054 02/21/23 1059 02/22/23 0322 02/23/23 0311 02/24/23 0347  HGB 8.2* 8.6* 8.1* 7.3* 7.3* 7.5* 8.1* 8.0* 8.0* 7.6*  -Appreciate GI recs:  Protonix, Carafate 4 times daily for 2  weeks... -Check anemia panel -Anticoagulation resumed.  Monitor H&H.  Would benefit from 1 unit before discharge.   History of PE/DVT/SMV thrombosis: Underwent TIPS procedure, aspiration of portal vein thrombus, SMV balloon angioplasty along with embolization of left gastric vein by IR on 02/09/2023 and was placed on Lovenox on 02/09/2023 with a plan to transition to Eliquis 1 month later.  MRI abdomen with occluded TIPS, portal vein and SMV.   -S/p TIPS thrombectomy, conical aspiration and thrombectomy of of portal, SM, IM and splenic veins and balloon angioplasty of SM and splenic vein on 1/29. -Anticoagulation resumed with IV heparin after discussion with IR and GI on 1/29.  Acute respiratory failure with hypoxia/possible aspiration pneumonia: CXR 1/24 showed multifocal pneumonia.  No shortness of breath but frequent cough.  She also reports postnasal drainage. -Continue IV Zosyn for a total of 5 days. -Mucolytic's and antitussive -Continue Claritin. -Encourage incentive telemetry   Malignant neoplasm of pancreas: s/p chemoradiation.  Followed by [name].  Currently being monitored off chemo for the last 3 months. She also went to [facility] for a Whipple's procedure but it could not be done due to the location of her tumor. Unfortunately CA 19.9 is now trending up and currently quite high.  MRI abdomen with stable pancreatic head mass.  Case was recently discussed during last hospitalization by hospitalist with [name] oncologist on 02/09/2023, also discussed her case with her primary oncologist [name] on 02/10/2023: Outpatient follow-up with oncology was advised at that time. -Outpatient follow-up with oncology.   Anxiety/depression: Stable -Continue bupropion, buspirone, escitalopram and mirtazapine   Leukopenia: Unclear cause of this.  No recent chemotherapy or immunotherapy.  Improving. -Continue monitoring   Hypokalemia -Monitor replenish as appropriate   Thrombocytopenia:  Resolved   Body mass index is 25.91 kg/m.           DVT prophylaxis:  SCDs Start: 02/17/23 2241  Code Status: Full code Family Communication: Updated patient's son over the phone from patient's phone at bedside Level of care: Med-Surg Status is: Inpatient Remains inpatient appropriate because: Recurrent GI bleed, occluded TIPS, aspiration pneumonia   Final disposition: Home Consultants:  Gastroenterology Interventional radiology  55 minutes with more than 50% spent in reviewing records, counseling patient/family and coordinating care.   Sch Meds:  Scheduled Meds:  buPROPion  300 mg Oral QPC breakfast   busPIRone  20 mg Oral BID   Chlorhexidine Gluconate Cloth  6 each Topical Daily   cholecalciferol  10,000 Units Oral q morning   escitalopram  20 mg Oral QHS   guaiFENesin  600 mg Oral BID   loratadine  10 mg Oral Daily   mirtazapine  15 mg Oral QHS   pantoprazole (PROTONIX) IV  40 mg Intravenous Q12H   QUEtiapine  25 mg Oral BID   sodium chloride flush  10-40 mL Intracatheter Q12H   sodium chloride flush  3 mL Intravenous Q12H   sucralfate  1 g Oral TID WC & HS   Continuous Infusions:  heparin 950 Units/hr (02/23/23 2103)   piperacillin-tazobactam (ZOSYN)  IV 3.375 g (02/24/23 0946)   PRN Meds:.acetaminophen **OR** acetaminophen, calcium carbonate, guaiFENesin-dextromethorphan, iohexol, LORazepam, ondansetron (ZOFRAN) IV, polyethylene glycol, sodium chloride flush  Antimicrobials: Anti-infectives (From admission, onward)  Start     Dose/Rate Route Frequency Ordered Stop   02/23/23 0000  cefTRIAXone (ROCEPHIN) 2 g in sodium chloride 0.9 % 100 mL IVPB        2 g 200 mL/hr over 30 Minutes Intravenous To Radiology 02/22/23 1244 02/24/23 0000   02/19/23 0200  piperacillin-tazobactam (ZOSYN) IVPB 3.375 g        3.375 g 12.5 mL/hr over 240 Minutes Intravenous Every 8 hours 02/19/23 0037          I have personally reviewed the following labs and  images: CBC: Recent Labs  Lab 02/19/23 0525 02/19/23 0956 02/20/23 0420 02/21/23 0054 02/21/23 1059 02/22/23 0322 02/23/23 0311 02/24/23 0347  WBC 8.9  --  7.1 5.4  --  3.8* 2.9* 3.8*  NEUTROABS 8.3*  --  6.2 4.4  --  2.8 1.9  --   HGB 8.2*   < > 7.3* 7.5* 8.1* 8.0* 8.0* 7.6*  HCT 25.1*   < > 23.3* 23.6* 24.9* 24.7* 25.1* 24.2*  MCV 91.3  --  92.8 92.2  --  91.5 91.6 91.7  PLT 149*  --  150 175  --  190 192 175   < > = values in this interval not displayed.   BMP &GFR Recent Labs  Lab 02/19/23 0525 02/20/23 0420 02/21/23 0054 02/22/23 0322 02/23/23 0311  NA 140 142 136 140 139  K 3.0* 3.5 3.3* 3.2* 3.9  CL 112* 113* 111 113* 112*  CO2 24 21* 20* 21* 19*  GLUCOSE 115* 99 117* 124* 113*  BUN 13 10 6* 6* 6*  CREATININE 0.82 0.88 0.92 0.83 0.87  CALCIUM 7.9* 7.7* 7.6* 8.1* 8.2*  MG 1.8 2.3 2.1 1.9 1.9   Estimated Creatinine Clearance: 47.1 mL/min (by C-G formula based on SCr of 0.87 mg/dL). Liver & Pancreas: Recent Labs  Lab 02/17/23 1735 02/23/23 0311  AST 50* 20  ALT 52* 21  ALKPHOS 104 83  BILITOT 0.4 0.4  PROT 5.7* 4.8*  ALBUMIN 2.7* 2.0*   No results for input(s): "LIPASE", "AMYLASE" in the last 168 hours. No results for input(s): "AMMONIA" in the last 168 hours. Diabetic: No results for input(s): "HGBA1C" in the last 72 hours. No results for input(s): "GLUCAP" in the last 168 hours. Cardiac Enzymes: No results for input(s): "CKTOTAL", "CKMB", "CKMBINDEX", "TROPONINI" in the last 168 hours. No results for input(s): "PROBNP" in the last 8760 hours. Coagulation Profile: Recent Labs  Lab 02/18/23 0627 02/23/23 0311  INR 1.1 1.1   Thyroid Function Tests: No results for input(s): "TSH", "T4TOTAL", "FREET4", "T3FREE", "THYROIDAB" in the last 72 hours. Lipid Profile: No results for input(s): "CHOL", "HDL", "LDLCALC", "TRIG", "CHOLHDL", "LDLDIRECT" in the last 72 hours. Anemia Panel: No results for input(s): "VITAMINB12", "FOLATE", "FERRITIN", "TIBC",  "IRON", "RETICCTPCT" in the last 72 hours. Urine analysis:    Component Value Date/Time   COLORURINE YELLOW 05/06/2022 1225   APPEARANCEUR CLEAR 05/06/2022 1225   LABSPEC 1.017 05/06/2022 1225   PHURINE 5.0 05/06/2022 1225   GLUCOSEU NEGATIVE 05/06/2022 1225   HGBUR NEGATIVE 05/06/2022 1225   BILIRUBINUR NEGATIVE 05/06/2022 1225   KETONESUR NEGATIVE 05/06/2022 1225   PROTEINUR NEGATIVE 05/06/2022 1225   NITRITE NEGATIVE 05/06/2022 1225   LEUKOCYTESUR NEGATIVE 05/06/2022 1225   Sepsis Labs: Invalid input(s): "PROCALCITONIN", "LACTICIDVEN"  Microbiology: No results found for this or any previous visit (from the past 240 hours).  Radiology Studies: [facility] MOD SED Result Date: 02/23/2023 CLINICAL DATA:  78 year old female with history of  pancreatic cancer status post radiation with development of progressive acute portal vein thrombus and recurrent gastric hemorrhage status post portal thrombectomy and TIPS placement on 02/09/2023 presenting with recurrent gastric bleeding and imaging evidence of TIPS occlusion. EXAM: 1. Ultrasound-guided vascular access to the left internal jugular vein. 2. Selective catheterization and venography of the inferior mesenteric vein, superior mesenteric vein, and splenic veins via the indwelling TIPS 3. Mechanical and aspiration thrombectomy of the TIPS, main portal vein, and central superior mesenteric, inferior mesenteric, and splenic veins. 4. Balloon angioplasty of the central superior mesenteric vein and splenic vein. MEDICATIONS: 8000 units heparin, intravenous ANESTHESIA/SEDATION: Moderate (conscious) sedation was employed during this procedure. A total of Versed 1 mg and Fentanyl 200 mcg was administered intravenously. Moderate Sedation Time: 72 minutes. The patient's level of consciousness and vital signs were monitored continuously by radiology nursing throughout the procedure under my direct supervision. CONTRAST:  65 mL Omnipaque 300,  intravenous FLUOROSCOPY TIME:  One hundred ninety mGy COMPLICATIONS: None immediate. PROCEDURE: Informed written consent was obtained from the patient after a thorough discussion of the procedural risks, benefits and alternatives. All questions were addressed. Maximal Sterile Barrier Technique was utilized including caps, mask, sterile gowns, sterile gloves, sterile drape, hand hygiene and skin antiseptic. A timeout was performed prior to the initiation of the procedure. Preprocedure ultrasound evaluation demonstrated patency of the left internal jugular vein. The procedure was planned. Subdermal Local anesthesia was administered with 1% lidocaine at the planned needle entry site. A small skin nick was made. Under direct ultrasound visualization, the left internal jugular vein was accessed with a 21 gauge micropuncture needle. A permanent ultrasound image was captured and stored in the record. A micropuncture sheath was introduced through which a Rosen wire was directed to the inferior vena cava. A 6 French destination sheath was then inserted and positioned in the right atrium. And in the a catheter was then directed to the hepatic vein ostium of the indwelling TIPS which was cannulated with moderate difficulty. A Rosen wire was then inserted and advanced level of the inferior mesenteric vein. Anatomy cross catheter was then placed over the wire and directed into the mid main inferior mesenteric vein. The wire was removed and inferior mesenteric venogram was performed which min straight it hepatofugal flow with occlusion of the inferior mesenteric vein centrally. The 6 French sheath was exchanged for a 16 French, 33 cm dry seal sheath. The sheath was advanced to the hepatic vein aspect of the indwelling endograft. Mechanical thrombectomy was then performed with the Revcore device through the central inferior mesenteric vein, main portal vein, and indwelling endograft. This was followed by aspiration thrombectomy  with a straight 16 [place] flowtriever. There was small volume of chronic appearing thrombus aspirated. Balloon angioplasty was then performed throughout the TIPS and into the main portal vein with a 10 mm x 80 mm Athletis balloon. Portal venogram was then performed which demonstrated an established channel a patency through the main portal vein and into the inferior mesenteric vein. A small aspect of the central superior mesenteric vein is identified. The Navicross catheter and Glidewire intra then used to cannulate the superior mesenteric vein. Mechanical thrombectomy was performed with the Revcore device through the central superior mesenteric vein into the main portal vein. Balloon angioplasty was then performed with a 6 mm x 80 mm Athletis balloon. Super mesenteric venogram was then performed which demonstrated significantly improved patency and hepatopetal flow through the superior mesenteric vein, main portal vein, and indwelling endograft. There  is no evidence of reflux into the splenic vein. Therefore, the splenic vein was then cannulated and balloon angioplasty was performed with a 10 mm x 80 mm Athletis balloon. There is a type least near the portal confluence. Completion splenic venogram demonstrated 6 improved patency inflow the splenic vein however still subchronic synechia centrally. There was a filling defect about the hepatic vein aspect of the indwelling endograft. Therefore, additional balloon angioplasty was performed centered about the hepatic vein aspect of the indwelling endograft. Completion venogram demonstrated significantly improved patency and antegrade flow through the indwelling endograft main portal vein. 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 The catheter and sheath were removed. Hemostasis was achieved with brief manual compression. A sterile bandage was applied. The patient tolerated the procedure well was transferred back to the floor in good condition. IMPRESSION: 1.  Successful mechanical and aspiration thrombectomy of the TIPS, main portal vein, and central superior mesenteric, inferior mesenteric, and splenic veins. 2. Balloon angioplasty of the central superior mesenteric vein, splenic vein, and hepatic vein aspect of the indwelling endograft. 3. Completion venogram demonstrated significantly improved patency and antegrade flow through the indwelling endograft, main portal vein, and central superior mesenteric vein. 4. 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 PLAN: Recommend restarting systemic anticoagulation to preserve portal and TIPS patency. Interventional radiology will follow. Marliss Coots, MD [facility] Radiology Electronically Signed   By: Marliss Coots M.D.   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 EXAM: 1. Ultrasound-guided vascular access to the left internal jugular vein. 2. Selective catheterization and venography of the inferior mesenteric vein, superior mesenteric vein, and splenic veins via the indwelling TIPS 3. Mechanical and aspiration thrombectomy of the TIPS, main portal vein, and central superior mesenteric, inferior mesenteric, and splenic veins. 4. Balloon angioplasty of the central superior mesenteric vein and splenic vein. MEDICATIONS: 8000 units heparin, intravenous ANESTHESIA/SEDATION: Moderate (conscious) sedation was employed during this procedure. A total of Versed 1 mg and Fentanyl 200 mcg was administered intravenously. Moderate Sedation Time: 72 minutes. The patient's level of consciousness and vital signs were  monitored continuously by radiology nursing throughout the procedure under my direct supervision. CONTRAST:  65 mL Omnipaque 300, intravenous FLUOROSCOPY TIME:  One hundred ninety mGy COMPLICATIONS: None immediate. PROCEDURE: Informed written consent was obtained from the patient after a thorough discussion of the procedural risks, benefits and alternatives. All questions were addressed. Maximal Sterile Barrier Technique was utilized including caps, mask, sterile gowns, sterile gloves, sterile drape, hand hygiene and skin antiseptic. A timeout was performed prior to the initiation of the procedure. Preprocedure ultrasound evaluation demonstrated patency of the left internal jugular vein. The procedure was planned. Subdermal Local anesthesia was administered with 1% lidocaine at the planned needle entry site. A small skin nick was made. Under direct ultrasound visualization, the left internal jugular vein was accessed with a 21 gauge micropuncture needle. A permanent ultrasound image was captured and stored in the record. A micropuncture sheath was introduced through which a Rosen wire was directed to the inferior vena cava. A 6 French destination sheath was then inserted and positioned in the right atrium. And in the a catheter was then directed  to the hepatic vein ostium of the indwelling TIPS which was cannulated with moderate difficulty. A Rosen wire was then inserted and advanced level of the inferior mesenteric vein. Anatomy cross catheter was then placed over the wire and directed into the mid main inferior mesenteric vein. The wire was removed and inferior mesenteric venogram was performed which min straight it hepatofugal flow with occlusion of the inferior mesenteric vein centrally. The 6 French sheath was exchanged for a 16 French, 33 cm dry seal sheath. The sheath was advanced to the hepatic vein aspect of the indwelling endograft. Mechanical thrombectomy was then performed with the Revcore device through  the central inferior mesenteric vein, main portal vein, and indwelling endograft. This was followed by aspiration thrombectomy with a straight 16 [place] flowtriever. There was small volume of chronic appearing thrombus aspirated. Balloon angioplasty was then performed throughout the TIPS and into the main portal vein with a 10 mm x 80 mm Athletis balloon. Portal venogram was then performed which demonstrated an established channel a patency through the main portal vein and into the inferior mesenteric vein. A small aspect of the central superior mesenteric vein is identified. The Navicross catheter and Glidewire intra then used to cannulate the superior mesenteric vein. Mechanical thrombectomy was performed with the Revcore device through the central superior mesenteric vein into the main portal vein. Balloon angioplasty was then performed with a 6 mm x 80 mm Athletis balloon. Super mesenteric venogram was then performed which demonstrated significantly improved patency and hepatopetal flow through the superior mesenteric vein, main portal vein, and indwelling endograft. There is no evidence of reflux into the splenic vein. Therefore, the splenic vein was then cannulated and balloon angioplasty was performed with a 10 mm x 80 mm Athletis balloon. There is a type least near the portal confluence. Completion splenic venogram demonstrated 6 improved patency inflow the splenic vein however still subchronic synechia centrally. There was a filling defect about the hepatic vein aspect of the indwelling endograft. Therefore, additional balloon angioplasty was performed centered about the hepatic vein aspect of the indwelling endograft. Completion venogram demonstrated significantly improved patency and antegrade flow through the indwelling endograft main portal vein. 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 The catheter and sheath were removed. Hemostasis was achieved with brief manual compression. A sterile  bandage was applied. The patient tolerated the procedure well was transferred back to the floor in good condition. IMPRESSION: 1. Successful mechanical and aspiration thrombectomy of the TIPS, main portal vein, and central superior mesenteric, inferior mesenteric, and splenic veins. 2. Balloon angioplasty of the central superior mesenteric vein, splenic vein, and hepatic vein aspect of the indwelling endograft. 3. Completion venogram demonstrated significantly improved patency and antegrade flow through the indwelling endograft, main portal vein, and central superior mesenteric vein. 4. 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 PLAN: Recommend restarting systemic anticoagulation to preserve portal and TIPS patency. Interventional radiology will follow. Marliss Coots, MD [facility] Radiology Electronically Signed   By: Marliss Coots M.D.   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 EXAM: 1. Ultrasound-guided vascular access to the left internal jugular vein. 2. Selective catheterization and venography of the inferior mesenteric  vein, superior mesenteric vein, and splenic veins via the indwelling TIPS 3. Mechanical and aspiration thrombectomy of the TIPS, main portal vein, and central superior mesenteric, inferior mesenteric, and splenic veins. 4. Balloon angioplasty of the central superior mesenteric vein and splenic vein. MEDICATIONS: 8000 units heparin, intravenous ANESTHESIA/SEDATION: Moderate (conscious) sedation was employed during this procedure. A total of Versed 1 mg and Fentanyl 200 mcg was  administered intravenously. Moderate Sedation Time: 72 minutes. The patient's level of consciousness and vital signs were monitored continuously by radiology nursing throughout the procedure under my direct supervision. CONTRAST:  65 mL Omnipaque 300, intravenous FLUOROSCOPY TIME:  One hundred ninety mGy COMPLICATIONS: None immediate. PROCEDURE: Informed written consent was obtained from the patient after a thorough discussion of the procedural risks, benefits and alternatives. All questions were addressed. Maximal Sterile Barrier Technique was utilized including caps, mask, sterile gowns, sterile gloves, sterile drape, hand hygiene and skin antiseptic. A timeout was performed prior to the initiation of the procedure. Preprocedure ultrasound evaluation demonstrated patency of the left internal jugular vein. The procedure was planned. Subdermal Local anesthesia was administered with 1% lidocaine at the planned needle entry site. A small skin nick was made. Under direct ultrasound visualization, the left internal jugular vein was accessed with a 21 gauge micropuncture needle. A permanent ultrasound image was captured and stored in the record. A micropuncture sheath was introduced through which a Rosen wire was directed to the inferior vena cava. A 6 French destination sheath was then inserted and positioned in the right atrium. And in the a catheter was then directed to the hepatic vein ostium of the indwelling TIPS which was cannulated with moderate difficulty. A Rosen wire was then inserted and advanced level of the inferior mesenteric vein. Anatomy cross catheter was then placed over the wire and directed into the mid main inferior mesenteric vein. The wire was removed and inferior mesenteric venogram was performed which min straight it hepatofugal flow with occlusion of the inferior mesenteric vein centrally. The 6 French sheath was exchanged for a 16 French, 33 cm dry seal sheath. The sheath was advanced to the  hepatic vein aspect of the indwelling endograft. Mechanical thrombectomy was then performed with the Revcore device through the central inferior mesenteric vein, main portal vein, and indwelling endograft. This was followed by aspiration thrombectomy with a straight 16 [place] flowtriever. There was small volume of chronic appearing thrombus aspirated. Balloon angioplasty was then performed throughout the TIPS and into the main portal vein with a 10 mm x 80 mm Athletis balloon. Portal venogram was then performed which demonstrated an established channel a patency through the main portal vein and into the inferior mesenteric vein. A small aspect of the central superior mesenteric vein is identified. The Navicross catheter and Glidewire intra then used to cannulate the superior mesenteric vein. Mechanical thrombectomy was performed with the Revcore device through the central superior mesenteric vein into the main portal vein. Balloon angioplasty was then performed with a 6 mm x 80 mm Athletis balloon. Super mesenteric venogram was then performed which demonstrated significantly improved patency and hepatopetal flow through the superior mesenteric vein, main portal vein, and indwelling endograft. There is no evidence of reflux into the splenic vein. Therefore, the splenic vein was then cannulated and balloon angioplasty was performed with a 10 mm x 80 mm Athletis balloon. There is a type least near the portal confluence. Completion splenic venogram demonstrated 6 improved patency inflow the splenic vein however still subchronic synechia centrally. There was  a filling defect about the hepatic vein aspect of the indwelling endograft. Therefore, additional balloon angioplasty was performed centered about the hepatic vein aspect of the indwelling endograft. Completion venogram demonstrated significantly improved patency and antegrade flow through the indwelling endograft main portal vein. There is persistent synechiae in  the splenic vein. The catheter and sheath were removed. Hemostasis was achieved with brief manual compression. A sterile bandage was applied. The patient tolerated the procedure well was transferred back to the floor in good condition. IMPRESSION: 1. Successful mechanical and aspiration thrombectomy of the TIPS, main portal vein, and central superior mesenteric, inferior mesenteric, and splenic veins. 2. Balloon angioplasty of the central superior mesenteric vein, splenic vein, and hepatic vein aspect of the indwelling endograft. 3. Completion venogram demonstrated significantly improved patency and antegrade flow through the indwelling endograft, main portal vein, and central superior mesenteric vein. 4. 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 PLAN: Recommend restarting systemic anticoagulation to preserve portal and TIPS patency. Interventional radiology will follow. Marliss Coots, MD [facility] Electronically Signed   By: Marliss Coots M.D.   On: 02/23/2023 21:22      Coline Calkin T. Nashira Mcglynn [facility]  If 7PM-7AM, please contact night-coverage [email or web address] 02/24/2023, 1:11 PM

## 2023-02-24 NOTE — Progress Notes (Signed)
PHARMACY - ANTICOAGULATION CONSULT NOTE  Pharmacy Consult for heparin infusion (PTA lovenox) Indication: history of pulmonary embolus  Allergies  Allergen Reactions   Other Hives and Other (See Comments)    Cherry wood just cut- "smelled it and broke out"; cannot tolerate ANY cherry fragrances, either      Cherry Hives   Wound Dressing Adhesive Rash and Other (See Comments)    Band-Aids = local reaction    Patient Measurements: Height: 5' 2.5" (158.8 cm) Weight: 65.3 kg (143 lb 15.4 oz) IBW/kg (Calculated) : 51.25 Heparin Dosing Weight: 59 kg  Vital Signs: Temp: 98 F (36.7 C) (01/30 0807) BP: 116/66 (01/30 0807) Pulse Rate: 85 (01/30 0807)  Labs: Recent Labs    02/22/23 0322 02/23/23 0311 02/24/23 0347  HGB 8.0* 8.0* 7.6*  HCT 24.7* 25.1* 24.2*  PLT 190 192 175  LABPROT  --  14.7  --   INR  --  1.1  --   HEPARINUNFRC  --   --  0.40  CREATININE 0.83 0.87  --     Estimated Creatinine Clearance: 47.1 mL/min (by C-G formula based on SCr of 0.87 mg/dL).   Medical History: Past Medical History:  Diagnosis Date   Anxiety    Arthritis    Depression    Dysrhythmia    hx palpitations greater than 5 yrs -neg echo, stress per pt ? where or dr   GERD (gastroesophageal reflux disease)    occ   Nausea & vomiting 08/24/2021   Pancreatic adenocarcinoma (HCC)    Pancreatic pseudocyst    Port-A-Cath in place 09/15/2021   Pulmonary embolism (HCC)    September 2022    Medications:  Medications Prior to Admission  Medication Sig Dispense Refill Last Dose/Taking   acetaminophen (TYLENOL) 500 MG tablet Take 2 tablets (1,000 mg total) by mouth every 8 (eight) hours as needed for moderate pain, headache or fever. (Patient taking differently: Take 750-1,000 mg by mouth every 8 (eight) hours as needed for moderate pain (pain score 4-6), headache or fever.)   02/16/2023   buPROPion (WELLBUTRIN XL) 300 MG 24 hr tablet Take 300 mg by mouth daily after breakfast.   02/17/2023    busPIRone (BUSPAR) 10 MG tablet Take 20 mg by mouth 2 (two) times daily.   02/17/2023 Morning   calcium carbonate (TUMS - DOSED IN MG ELEMENTAL CALCIUM) 500 MG chewable tablet Chew 1 tablet by mouth daily as needed for indigestion or heartburn.   Past Month   Cholecalciferol (VITAMIN D3) 125 MCG (5000 UT) CAPS Take 10,000 Units by mouth every morning.   02/17/2023   enoxaparin (LOVENOX) 60 MG/0.6ML injection Inject 0.6 mLs (60 mg total) into the skin every 12 (twelve) hours. 36 mL 0 02/17/2023 Morning   escitalopram (LEXAPRO) 20 MG tablet Take 20 mg by mouth at bedtime.   02/16/2023   lactose free nutrition (BOOST) LIQD Take 237 mLs by mouth 2 (two) times daily between meals.   02/17/2023 Morning   LINZESS 72 MCG capsule TAKE 1 CAPSULE BY MOUTH DAILY BEFORE BREAKFAST (Patient taking differently: Take 72 mcg by mouth daily before breakfast.) 30 capsule 0 02/17/2023 Morning   mirtazapine (REMERON) 30 MG tablet Take 15 mg by mouth at bedtime.   02/16/2023   pantoprazole (PROTONIX) 40 MG tablet Take 1 tablet (40 mg total) by mouth 2 (two) times daily. 180 tablet 0 02/17/2023 Morning   QUEtiapine (SEROQUEL) 25 MG tablet Take 25 mg by mouth 2 (two) times daily.   02/17/2023 Morning  sucralfate (CARAFATE) 1 g tablet Take 1 tablet (1 g total) by mouth 4 (four) times daily -  with meals and at bedtime for 14 days. 56 tablet 0 02/17/2023 Morning   lidocaine-prilocaine (EMLA) cream Apply a small amount to port a cath site (do not rub in) and cover with plastic wrap 1 hour prior to infusion appointments (Patient not taking: Reported on 02/17/2023) 30 g 3 Not Taking   ondansetron (ZOFRAN) 4 MG tablet Take 1 tablet (4 mg total) by mouth every 6 (six) hours as needed for nausea or vomiting. (Patient not taking: Reported on 02/17/2023) 30 tablet 0 Not Taking    Assessment: 42 YOF admitted 02/17/23 with bright red blood per rectum x1 episode. Patient was recently hospitalized from 1/12 to 1/19 with GI bleed and SMV vein  thrombosis and underwent TIPS with aspiration of thrombosis and SMV angioplasty plus embolization of left gastric vein by IR 1/15 and was discharged on 1 month of Lovenox with a plan to eventually transition to Eliquis. Last dose of Lovenox was on 02/17/23. Patient is now status post TIPS thrombectomy (completed 1/29 ~1600; estimated blood loss 100 mL). Consult order received to start heparin infusion without a bolus for history of PE.   Initial heparin level is therapeutic at 0.40.  Post operative day 1: Hgb 7.6 - stable based on prior results, Plts 175. INR 1.1. No overt bleeding reported. Vital signs are stable.    Goal of Therapy:  Heparin level 0.3-0.7 units/ml Monitor platelets by anticoagulation protocol: Yes   Plan:  Continue heparin at 950 units/hr.  Recheck Heparin level in 8 hours.  Monitor CBC and for any signs or symptoms of bleeding.  Follow-up plans for oral anticoagulant.   Link Snuffer, PharmD, BCPS, BCCCP Please refer to [facility] for [facility] numbers 02/24/2023 8:17 AM

## 2023-02-24 NOTE — Progress Notes (Signed)
Physical Therapy Treatment Patient Details Name: Sheryl Bender MRN: [id number] DOB: 1945/11/25 Today's Date: 02/24/2023   History of Present Illness 78 y.o. female admitted 1/23 with Recurrent GI bleeding. S/p EGD with hemostasis clip placement 1/24. S/p TIPS thrombectomy 1/29. 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  hospitalized from 02/06/2023-02/13/2023 with GI bleeding requiring EGD.    PT Comments  Continues to progress well towards acute functional goals post-op day #1. Ambulating 150 feet at supervision level, intermittent use of IV pole for support but no overt LOB. Goals updated to further increase safety and independence. Denies dizziness (see orthostatics below.) SpO2 96% on RA while ambulating with 2/3 dyspnea. Reviewed LE exercises, encouraged OOB frequently with staff, as tolerated. Patient will continue to benefit from skilled physical therapy services to further improve independence with functional mobility.     02/24/23 1158  Orthostatic Lying   BP- Lying 118/62  Pulse- Lying 88  Orthostatic Sitting  BP- Sitting 116/60  Pulse- Sitting 96  Orthostatic Standing at 0 minutes  BP- Standing at 0 minutes 125/61  Pulse- Standing at 0 minutes 101       If plan is discharge home, recommend the following: Assist for transportation;Assistance with cooking/housework;A little help with walking and/or transfers;A little help with bathing/dressing/bathroom;Help with stairs or ramp for entrance   Can travel by private vehicle        Equipment Recommendations  None recommended by PT    Recommendations for Other Services       Precautions / Restrictions Precautions Precautions: Fall Restrictions Weight Bearing Restrictions Per Provider Order: No     Mobility   Bed Mobility Overal bed mobility: Modified Independent             General bed mobility comments: Extra time, effortful, no physical assist.    Transfers Overall transfer level: Needs assistance Equipment used: None Transfers: Sit to/from Stand Sit to Stand: Supervision           General transfer comment: Supervision for safety, very mild sway able to self correct, declines RW for support. Performed from toilet with rail at mod I level.    Ambulation/Gait Ambulation/Gait assistance: Supervision Gait Distance (Feet): 80 Feet Assistive device: None, IV Pole Gait Pattern/deviations: Step-through pattern, Decreased stride length, Drifts right/left Gait velocity: decr Gait velocity interpretation: 1.31 - 2.62 ft/sec, indicative of limited community ambulator   General Gait Details: Minor instability, able to self correct, intermittently touching IV pole but no overt LOB. Cues for safety, awareness, pacing. Reports moderate SOB; SpO2 96% on RA, HR 112. no dizziness.   Stairs             Wheelchair Mobility     Tilt Bed    Modified Rankin (Stroke Patients Only)       Balance Overall balance assessment: Needs assistance Sitting-balance support: No upper extremity supported, Feet supported Sitting balance-Leahy Scale: Good Sitting balance - Comments: sitting EOB   Standing balance support: No upper extremity supported, During functional activity Standing balance-Leahy Scale: Fair Standing balance comment: Pt able to ambulate without AD without LOB, however demonstrates minor instability                            Cognition Arousal: Alert Behavior During Therapy: WFL for tasks assessed/performed Overall Cognitive Status: Within Functional Limits for tasks assessed  Exercises General Exercises - Lower Extremity Ankle Circles/Pumps: AROM, Both, 10 reps, Supine Quad Sets: Strengthening,  Both, 10 reps, Supine Gluteal Sets: Strengthening, Both, 10 reps, Seated    General Comments General comments (skin integrity, edema, etc.): See orthostatics. No dizziness today, stable. SpO2 96% on RA.      Pertinent Vitals/Pain Pain Assessment Pain Assessment: Faces Faces Pain Scale: Hurts a little bit Pain Location: Lt flank when coughing Pain Descriptors / Indicators: Discomfort, Sore Pain Intervention(s): Monitored during session, Repositioned    Home Living                          Prior Function            PT Goals (current goals can now be found in the care plan section) Acute Rehab PT Goals Patient Stated Goal: get stronger PT Goal Formulation: With patient Time For Goal Achievement: 03/11/23 Potential to Achieve Goals: Fair Progress towards PT goals: Progressing toward goals    Frequency    Min 1X/week      PT Plan      Co-evaluation              AM-PAC PT "6 Clicks" Mobility   Outcome Measure  Help needed turning from your back to your side while in a flat bed without using bedrails?: None Help needed moving from lying on your back to sitting on the side of a flat bed without using bedrails?: None Help needed moving to and from a bed to a chair (including a wheelchair)?: A Little Help needed standing up from a chair using your arms (e.g., wheelchair or bedside chair)?: A Little Help needed to walk in hospital room?: A Little Help needed climbing 3-5 steps with a railing? : A Little 6 Click Score: 20    End of Session Equipment Utilized During Treatment: Gait belt Activity Tolerance: Patient tolerated treatment well Patient left: with call bell/phone within reach;in bed;with bed alarm set   PT Visit Diagnosis: Unsteadiness on feet (R26.81);Muscle weakness (generalized) (M62.81);Other abnormalities of gait and mobility (R26.89)     Time: 1610-9604 PT Time Calculation (min) (ACUTE ONLY): 22 min  Charges:    $Gait Training:  8-22 mins PT General Charges $$ ACUTE PT VISIT: 1 Visit                     Kathlyn Sacramento, PT, DPT [facility]  Rehabilitation Services Physical Therapist Office: [phone] Website: [email or web address]    Berton Mount 02/24/2023, 12:51 PM

## 2023-02-25 ENCOUNTER — Inpatient Hospital Stay (HOSPITAL_COMMUNITY): Payer: Medicare Other

## 2023-02-25 DIAGNOSIS — K922 Gastrointestinal hemorrhage, unspecified: Secondary | ICD-10-CM | POA: Diagnosis not present

## 2023-02-25 DIAGNOSIS — K55069 Acute infarction of intestine, part and extent unspecified: Secondary | ICD-10-CM | POA: Diagnosis not present

## 2023-02-25 DIAGNOSIS — C250 Malignant neoplasm of head of pancreas: Secondary | ICD-10-CM | POA: Diagnosis not present

## 2023-02-25 LAB — CBC
HCT: 23.7 % — ABNORMAL LOW (ref 36.0–46.0)
Hemoglobin: 7.6 g/dL — ABNORMAL LOW (ref 12.0–15.0)
MCH: 29.3 pg (ref 26.0–34.0)
MCHC: 32.1 g/dL (ref 30.0–36.0)
MCV: 91.5 fL (ref 80.0–100.0)
Platelets: 186 10*3/uL (ref 150–400)
RBC: 2.59 MIL/uL — ABNORMAL LOW (ref 3.87–5.11)
RDW: 17.6 % — ABNORMAL HIGH (ref 11.5–15.5)
WBC: 4.5 10*3/uL (ref 4.0–10.5)
nRBC: 0 % (ref 0.0–0.2)

## 2023-02-25 LAB — COMPREHENSIVE METABOLIC PANEL
ALT: 20 U/L (ref 0–44)
AST: 21 U/L (ref 15–41)
Albumin: 1.9 g/dL — ABNORMAL LOW (ref 3.5–5.0)
Alkaline Phosphatase: 75 U/L (ref 38–126)
Anion gap: 4 — ABNORMAL LOW (ref 5–15)
BUN: 10 mg/dL (ref 8–23)
CO2: 20 mmol/L — ABNORMAL LOW (ref 22–32)
Calcium: 7.7 mg/dL — ABNORMAL LOW (ref 8.9–10.3)
Chloride: 111 mmol/L (ref 98–111)
Creatinine, Ser: 0.86 mg/dL (ref 0.44–1.00)
GFR, Estimated: >60 mL/min (ref >60)
Glucose, Bld: 152 mg/dL — ABNORMAL HIGH (ref 70–99)
Potassium: 3 mmol/L — ABNORMAL LOW (ref 3.5–5.1)
Sodium: 135 mmol/L (ref 135–145)
Total Bilirubin: 0.3 mg/dL (ref 0.0–1.2)
Total Protein: 4.4 g/dL — ABNORMAL LOW (ref 6.5–8.1)

## 2023-02-25 LAB — HEPARIN LEVEL (UNFRACTIONATED): Heparin Unfractionated: 0.31 [IU]/mL (ref 0.30–0.70)

## 2023-02-25 LAB — MAGNESIUM: Magnesium: 1.8 mg/dL (ref 1.7–2.4)

## 2023-02-25 MED ORDER — IOHEXOL 350 MG/ML SOLN
100.0000 mL | Freq: Once | INTRAVENOUS | Status: AC | PRN
  Administered 2023-02-25: 100 mL via INTRAVENOUS

## 2023-02-25 MED ORDER — POTASSIUM CHLORIDE CRYS ER 20 MEQ PO TBCR
40.0000 meq | EXTENDED_RELEASE_TABLET | ORAL | Status: AC
  Administered 2023-02-25 (×2): 40 meq via ORAL
  Filled 2023-02-25 (×2): qty 2

## 2023-02-25 MED ORDER — SIMETHICONE 80 MG PO CHEW
80.0000 mg | CHEWABLE_TABLET | Freq: Four times a day (QID) | ORAL | Status: DC | PRN
  Administered 2023-02-25 – 2023-02-27 (×4): 80 mg via ORAL
  Filled 2023-02-25 (×4): qty 1

## 2023-02-25 NOTE — Plan of Care (Signed)

## 2023-02-25 NOTE — Progress Notes (Signed)
Referring Physician(s): Mike Gip, PA-C  Supervising Physician: Gilmer Mor  Patient Status:  [facility] - In-pt  Chief Complaint:  Pancreatic CA, recurrent UGIB s/p TIPS with aspiration and mechanical thrombectomy with drug coated balloon angioplasty of the portal and superior mesenteric veins and embolization of a left gastric vein 02/09/23. 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 She underwent TIPS revision with thrombectomy 02/23/23  Subjective: Patient resting comfortably in bed. She denies significant pain or discomfort  Allergies: Other, Cherry, and Wound dressing adhesive  Medications: Prior to Admission medications   Medication Sig Start Date End Date Taking? Authorizing Provider  acetaminophen (TYLENOL) 500 MG tablet Take 2 tablets (1,000 mg total) by mouth every 8 (eight) hours as needed for moderate pain, headache or fever. Patient taking differently: Take 750-1,000 mg by mouth every 8 (eight) hours as needed for moderate pain (pain score 4-6), headache or fever. 07/21/21  Yes Vassie Loll, MD  buPROPion (WELLBUTRIN XL) 300 MG 24 hr tablet Take 300 mg by mouth daily after breakfast. 06/15/21  Yes [provider]  busPIRone (BUSPAR) 10 MG tablet Take 20 mg by mouth 2 (two) times daily. 01/27/21  Yes [provider]  calcium carbonate (TUMS - DOSED IN MG ELEMENTAL CALCIUM) 500 MG chewable tablet Chew 1 tablet by mouth daily as needed for indigestion or heartburn.   Yes [provider]  Cholecalciferol (VITAMIN D3) 125 MCG (5000 UT) CAPS Take 10,000 Units by mouth every morning.   Yes [provider]  enoxaparin (LOVENOX) 60 MG/0.6ML injection Inject 0.6 mLs (60 mg total) into the skin every 12 (twelve) hours. 02/12/23  Yes Leroy Sea, MD  escitalopram (LEXAPRO) 20 MG tablet Take 20 mg by mouth at bedtime.   Yes [provider]  lactose  free nutrition (BOOST) LIQD Take 237 mLs by mouth 2 (two) times daily between meals.   Yes [provider]  LINZESS 72 MCG capsule TAKE 1 CAPSULE BY MOUTH DAILY BEFORE BREAKFAST Patient taking differently: Take 72 mcg by mouth daily before breakfast. 01/20/23  Yes Mansouraty, Netty Starring., MD  mirtazapine (REMERON) 30 MG tablet Take 15 mg by mouth at bedtime.   Yes [provider]  pantoprazole (PROTONIX) 40 MG tablet Take 1 tablet (40 mg total) by mouth 2 (two) times daily. 02/02/23 05/03/23 Yes Carollee Herter, DO  QUEtiapine (SEROQUEL) 25 MG tablet Take 25 mg by mouth 2 (two) times daily. 12/14/22  Yes [provider]  sucralfate (CARAFATE) 1 g tablet Take 1 tablet (1 g total) by mouth 4 (four) times daily -  with meals and at bedtime for 14 days. 02/12/23 02/26/23 Yes Leroy Sea, MD  lidocaine-prilocaine (EMLA) cream Apply a small amount to port a cath site (do not rub in) and cover with plastic wrap 1 hour prior to infusion appointments Patient not taking: Reported on 02/17/2023 09/15/21   Doreatha Massed, MD  ondansetron (ZOFRAN) 4 MG tablet Take 1 tablet (4 mg total) by mouth every 6 (six) hours as needed for nausea or vomiting. Patient not taking: Reported on 02/17/2023 02/02/23 03/04/23  Carollee Herter, DO     Vital Signs: BP 134/68 (BP Location: Right Arm)   Pulse (!) 102   Temp 98.3 F (36.8 C)   Resp 18   Ht 5' 2.5" (1.588 m)   Wt 143 lb 15.4 oz (65.3 kg)   SpO2 94%   BMI 25.91 kg/m  Physical Exam Constitutional:      General: She is not in acute distress. Cardiovascular:     Rate and Rhythm: Tachycardia present.     Comments: Left internal jugular vascular site dressing removed. Site is unremarkable with the exception of a small scab.  Pulmonary:     Effort: Pulmonary effort is normal.  Skin:    General: Skin is warm and dry.  Neurological:     Mental Status: She is alert and oriented to person, place, and time.     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 Pancreatic cancer. * Tracking Code: BO * EXAM: CTA ABDOMEN AND PELVIS WITHOUT AND WITH CONTRAST TECHNIQUE: Multidetector CT imaging of the abdomen and pelvis was performed using the standard protocol during bolus administration of intravenous contrast. Multiplanar reconstructed images and MIPs were obtained and reviewed to evaluate the vascular anatomy. RADIATION DOSE REDUCTION: This exam was performed according to the departmental dose-optimization program which includes automated exposure control, adjustment of the mA and/or kV according to patient size and/or use of iterative reconstruction technique. CONTRAST:  OMNIPAQUE IOHEXOL 350 MG/ML SOLN COMPARISON:  MRI 02/17/2023.  CT 02/06/2023. FINDINGS: VASCULAR Aorta: Thoracic aorta has a normal course and caliber with mild atherosclerotic calcified plaque. Celiac: Distal celiac is obscured by the streak artifact from the adjacent stent placement. The origin of the vessel has some minimal calcified plaque. Splenic artery and hepatic arteries are patent more distally. There intact branches of the distal left gastric. SMA: Patent without evidence of aneurysm, dissection, vasculitis or significant stenosis. Renals: Both renal arteries are patent without evidence of aneurysm, dissection, vasculitis, fibromuscular dysplasia or significant stenosis. IMA: Patent without evidence of aneurysm, dissection, vasculitis or significant stenosis. Inflow: Mild calcified plaque along the iliac vessels. Proximal Outflow: Slight calcified plaque along the common femoral and SFA vessels included in the imaging field. Veins: Preserved IVC. There has been placement of a tips shunt extending from the IVC just above the liver through the middle hepatic vein and then into the main portal vein. The tips shunt is occluded along its course. Is also thrombosis of the  portal vein. This includes the central portions of the left and right portal vein as well as the main portal vein. 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 There are some secondary and tertiary branches of the SMV which are patent. Review of the MIP images confirms the above findings. NON-VASCULAR Lower chest: Developing small bilateral pleural effusions with adjacent opacities. New small pericardial effusion. Hepatobiliary: Slightly heterogeneous appearing liver. Small benign-appearing hepatic cystic foci identified. Pancreas: Severe pancreatic atrophy with severe ductal dilatation in the neck, body and tail with mass lesion again identified at the head neck region. Mass estimated in size at 3.1 by 2.0 cm. This involves the portal venous confluence. Is also involvement of the course of the GDA. Small bubble of air within a SMV branch. This could be postprocedural. Spleen: Spleen is actually with 2 separate components, nonspecific. Please correlate for any history of remote injury or other process. Adrenals/Urinary Tract: Stable adrenal glands. Parapelvic renal cysts identified. Ureters have normal course and caliber extending down to the urinary bladder. Preserved contour to the urinary bladder. Stomach/Bowel: On this non oral contrast exam large bowel is normal course and caliber. There is some scattered stool. Small bowel is nondilated. Stomach is underdistended but there is continued wall thickening and loss of normal fold pattern  along the body antrum of the stomach. There is also some wall thickening along the proximal duodenal areas. High density intraluminal linear focus within the distal stomach. Of note there is some wall thickening along the distal esophagus with small hiatal hernia and some varices. Lymphatic: No specific abnormal lymph node enlargement in the abdomen and pelvis. Few prominent porta hepatic nodes are identified. Reproductive: Uterus and bilateral adnexa are unremarkable. Other: Developing  mild-to-moderate ascites. Musculoskeletal: Significant streak artifact from the spinal fixation hardware. Moderate multilevel degenerative changes along the spine. Scattered degenerative changes of the pelvis. IMPRESSION: Placement of a tips shunt. The shunt is occluded. Additional stents in the area of the portal venous confluence. There is also occlusion of the port main portal vein and central mid right left polar veins. There continues to be occlusion of the SMV and first order branches. Secondary and tertiary branches of the SMV appear to be open. There is some air within a branch of the SMV along the left side centrally which could be postprocedure related. Developing mild varices. Known pancreatic neoplasm with involvement of the portal venous confluence. Developing mild-to-moderate ascites, mesenteric stranding. No free intra-air. Developing bilateral pleural effusions adjacent opacities and a small pericardial effusion. Persistent wall thickening along the stomach and proximal duodenum with loss of fold pattern. No dilatation Electronically Signed   By: Karen Kays M.D.   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 EXAM: 1. Ultrasound-guided vascular access to the left internal jugular vein. 2. Selective catheterization and venography of the inferior mesenteric vein, superior mesenteric vein, and splenic veins via the indwelling TIPS 3. Mechanical and aspiration thrombectomy of the TIPS, main portal vein, and central superior mesenteric, inferior mesenteric, and splenic veins. 4. Balloon angioplasty of the central superior mesenteric vein and  splenic vein. MEDICATIONS: 8000 units heparin, intravenous ANESTHESIA/SEDATION: Moderate (conscious) sedation was employed during this procedure. A total of Versed 1 mg and Fentanyl 200 mcg was administered intravenously. Moderate Sedation Time: 72 minutes. The patient's level of consciousness and vital signs were monitored continuously by radiology nursing throughout the procedure under my direct supervision. CONTRAST:  65 mL Omnipaque 300, intravenous FLUOROSCOPY TIME:  One hundred ninety mGy COMPLICATIONS: None immediate. PROCEDURE: Informed written consent was obtained from the patient after a thorough discussion of the procedural risks, benefits and alternatives. All questions were addressed. Maximal Sterile Barrier Technique was utilized including caps, mask, sterile gowns, sterile gloves, sterile drape, hand hygiene and skin antiseptic. A timeout was performed prior to the initiation of the procedure. Preprocedure ultrasound evaluation demonstrated patency of the left internal jugular vein. The procedure was planned. Subdermal Local anesthesia was administered with 1% lidocaine at the planned needle entry site. A small skin nick was made. Under direct ultrasound visualization, the left internal jugular vein was accessed with a 21 gauge micropuncture needle. A permanent ultrasound image was captured and stored in the record. A micropuncture sheath was introduced through which a Rosen wire was directed to the inferior vena cava. A 6 French destination sheath was then inserted and positioned in the right atrium. And in the a catheter was then directed to the hepatic vein ostium of the indwelling TIPS which was cannulated with  moderate difficulty. A Rosen wire was then inserted and advanced level of the inferior mesenteric vein. Anatomy cross catheter was then placed over the wire and directed into the mid main inferior mesenteric vein. The wire was removed and inferior mesenteric venogram was performed which min  straight it hepatofugal flow with occlusion of the inferior mesenteric vein centrally. The 6 French sheath was exchanged for a 16 French, 33 cm dry seal sheath. The sheath was advanced to the hepatic vein aspect of the indwelling endograft. Mechanical thrombectomy was then performed with the Revcore device through the central inferior mesenteric vein, main portal vein, and indwelling endograft. This was followed by aspiration thrombectomy with a straight 16 [place] flowtriever. There was small volume of chronic appearing thrombus aspirated. Balloon angioplasty was then performed throughout the TIPS and into the main portal vein with a 10 mm x 80 mm Athletis balloon. Portal venogram was then performed which demonstrated an established channel a patency through the main portal vein and into the inferior mesenteric vein. A small aspect of the central superior mesenteric vein is identified. The Navicross catheter and Glidewire intra then used to cannulate the superior mesenteric vein. Mechanical thrombectomy was performed with the Revcore device through the central superior mesenteric vein into the main portal vein. Balloon angioplasty was then performed with a 6 mm x 80 mm Athletis balloon. Super mesenteric venogram was then performed which demonstrated significantly improved patency and hepatopetal flow through the superior mesenteric vein, main portal vein, and indwelling endograft. There is no evidence of reflux into the splenic vein. Therefore, the splenic vein was then cannulated and balloon angioplasty was performed with a 10 mm x 80 mm Athletis balloon. There is a type least near the portal confluence. Completion splenic venogram demonstrated 6 improved patency inflow the splenic vein however still subchronic synechia centrally. There was a filling defect about the hepatic vein aspect of the indwelling endograft. Therefore, additional balloon angioplasty was performed centered about the hepatic vein aspect of the  indwelling endograft. Completion venogram demonstrated significantly improved patency and antegrade flow through the indwelling endograft main portal vein. 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 The catheter and sheath were removed. Hemostasis was achieved with brief manual compression. A sterile bandage was applied. The patient tolerated the procedure well was transferred back to the floor in good condition. IMPRESSION: 1. Successful mechanical and aspiration thrombectomy of the TIPS, main portal vein, and central superior mesenteric, inferior mesenteric, and splenic veins. 2. Balloon angioplasty of the central superior mesenteric vein, splenic vein, and hepatic vein aspect of the indwelling endograft. 3. Completion venogram demonstrated significantly improved patency and antegrade flow through the indwelling endograft, main portal vein, and central superior mesenteric vein. 4. 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 PLAN: Recommend restarting systemic anticoagulation to preserve portal and TIPS patency. Interventional radiology will follow. Marliss Coots, MD [facility] Radiology Electronically Signed   By: Marliss Coots M.D.   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 EXAM: 1. Ultrasound-guided vascular access to the left internal jugular vein. 2. Selective catheterization and venography of the inferior mesenteric vein, superior mesenteric vein, and splenic veins via the indwelling TIPS 3.  Mechanical and aspiration thrombectomy of the TIPS, main portal vein, and central superior  mesenteric, inferior mesenteric, and splenic veins. 4. Balloon angioplasty of the central superior mesenteric vein and splenic vein. MEDICATIONS: 8000 units heparin, intravenous ANESTHESIA/SEDATION: Moderate (conscious) sedation was employed during this procedure. A total of Versed 1 mg and Fentanyl 200 mcg was administered intravenously. Moderate Sedation Time: 72 minutes. The patient's level of consciousness and vital signs were monitored continuously by radiology nursing throughout the procedure under my direct supervision. CONTRAST:  65 mL Omnipaque 300, intravenous FLUOROSCOPY TIME:  One hundred ninety mGy COMPLICATIONS: None immediate. PROCEDURE: Informed written consent was obtained from the patient after a thorough discussion of the procedural risks, benefits and alternatives. All questions were addressed. Maximal Sterile Barrier Technique was utilized including caps, mask, sterile gowns, sterile gloves, sterile drape, hand hygiene and skin antiseptic. A timeout was performed prior to the initiation of the procedure. Preprocedure ultrasound evaluation demonstrated patency of the left internal jugular vein. The procedure was planned. Subdermal Local anesthesia was administered with 1% lidocaine at the planned needle entry site. A small skin nick was made. Under direct ultrasound visualization, the left internal jugular vein was accessed with a 21 gauge micropuncture needle. A permanent ultrasound image was captured and stored in the record. A micropuncture sheath was introduced through which a Rosen wire was directed to the inferior vena cava. A 6 French destination sheath was then inserted and positioned in the right atrium. And in the a catheter was then directed to the hepatic vein ostium of the indwelling TIPS which was cannulated with moderate difficulty. A Rosen wire was then inserted and advanced level of the inferior mesenteric vein. Anatomy cross catheter was then placed over the wire and directed  into the mid main inferior mesenteric vein. The wire was removed and inferior mesenteric venogram was performed which min straight it hepatofugal flow with occlusion of the inferior mesenteric vein centrally. The 6 French sheath was exchanged for a 16 French, 33 cm dry seal sheath. The sheath was advanced to the hepatic vein aspect of the indwelling endograft. Mechanical thrombectomy was then performed with the Revcore device through the central inferior mesenteric vein, main portal vein, and indwelling endograft. This was followed by aspiration thrombectomy with a straight 16 [place] flowtriever. There was small volume of chronic appearing thrombus aspirated. Balloon angioplasty was then performed throughout the TIPS and into the main portal vein with a 10 mm x 80 mm Athletis balloon. Portal venogram was then performed which demonstrated an established channel a patency through the main portal vein and into the inferior mesenteric vein. A small aspect of the central superior mesenteric vein is identified. The Navicross catheter and Glidewire intra then used to cannulate the superior mesenteric vein. Mechanical thrombectomy was performed with the Revcore device through the central superior mesenteric vein into the main portal vein. Balloon angioplasty was then performed with a 6 mm x 80 mm Athletis balloon. Super mesenteric venogram was then performed which demonstrated significantly improved patency and hepatopetal flow through the superior mesenteric vein, main portal vein, and indwelling endograft. There is no evidence of reflux into the splenic vein. Therefore, the splenic vein was then cannulated and balloon angioplasty was performed with a 10 mm x 80 mm Athletis balloon. There is a type least near the portal confluence. Completion splenic venogram demonstrated 6 improved patency inflow the splenic vein however still subchronic synechia centrally. There was a filling defect about the hepatic vein aspect of the  indwelling  endograft. Therefore, additional balloon angioplasty was performed centered about the hepatic vein aspect of the indwelling endograft. Completion venogram demonstrated significantly improved patency and antegrade flow through the indwelling endograft main portal vein. 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 The catheter and sheath were removed. Hemostasis was achieved with brief manual compression. A sterile bandage was applied. The patient tolerated the procedure well was transferred back to the floor in good condition. IMPRESSION: 1. Successful mechanical and aspiration thrombectomy of the TIPS, main portal vein, and central superior mesenteric, inferior mesenteric, and splenic veins. 2. Balloon angioplasty of the central superior mesenteric vein, splenic vein, and hepatic vein aspect of the indwelling endograft. 3. Completion venogram demonstrated significantly improved patency and antegrade flow through the indwelling endograft, main portal vein, and central superior mesenteric vein. 4. 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 PLAN: Recommend restarting systemic anticoagulation to preserve portal and TIPS patency. Interventional radiology will follow. Marliss Coots, MD [facility] Radiology Electronically Signed   By: Marliss Coots M.D.   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 EXAM: 1. Ultrasound-guided vascular access to the left internal jugular vein. 2. Selective catheterization and venography of the inferior mesenteric vein, superior mesenteric vein, and splenic veins  via the indwelling TIPS 3. Mechanical and aspiration thrombectomy of the TIPS, main portal vein, and central superior mesenteric, inferior mesenteric, and splenic veins. 4. Balloon angioplasty of the central superior mesenteric vein and splenic vein. MEDICATIONS: 8000 units heparin, intravenous ANESTHESIA/SEDATION: Moderate (conscious) sedation was employed during this procedure. A total of Versed 1 mg and Fentanyl 200 mcg was administered intravenously. Moderate Sedation Time: 72 minutes. The patient's level of consciousness and vital signs were monitored continuously by radiology nursing throughout the procedure under my direct supervision. CONTRAST:  65 mL Omnipaque 300, intravenous FLUOROSCOPY TIME:  One hundred ninety mGy COMPLICATIONS: None immediate. PROCEDURE: Informed written consent was obtained from the patient after a thorough discussion of the procedural risks, benefits and alternatives. All questions were addressed. Maximal Sterile Barrier Technique was utilized including caps, mask, sterile gowns, sterile gloves, sterile drape, hand hygiene and skin antiseptic. A timeout was performed prior to the initiation of the procedure. Preprocedure ultrasound evaluation demonstrated patency of the left internal jugular vein. The procedure was planned. Subdermal Local anesthesia was administered with 1% lidocaine at the planned needle entry site. A small skin nick was made. Under direct ultrasound visualization, the left internal jugular vein was accessed with a 21 gauge micropuncture needle. A permanent ultrasound image was captured and stored in the record. A micropuncture sheath was introduced through which a Rosen wire was directed to the inferior vena cava. A 6 French destination sheath was then inserted and positioned in the right atrium. And in the a catheter was then directed to the hepatic vein ostium of the indwelling TIPS which was cannulated with moderate difficulty. A Rosen wire was then inserted  and advanced level of the inferior mesenteric vein. Anatomy cross catheter was then placed over the wire and directed into the mid main inferior mesenteric vein. The wire was removed and inferior mesenteric venogram was performed which min straight it hepatofugal flow with occlusion of the inferior mesenteric  vein centrally. The 6 French sheath was exchanged for a 16 French, 33 cm dry seal sheath. The sheath was advanced to the hepatic vein aspect of the indwelling endograft. Mechanical thrombectomy was then performed with the Revcore device through the central inferior mesenteric vein, main portal vein, and indwelling endograft. This was followed by aspiration thrombectomy with a straight 16 [place] flowtriever. There was small volume of chronic appearing thrombus aspirated. Balloon angioplasty was then performed throughout the TIPS and into the main portal vein with a 10 mm x 80 mm Athletis balloon. Portal venogram was then performed which demonstrated an established channel a patency through the main portal vein and into the inferior mesenteric vein. A small aspect of the central superior mesenteric vein is identified. The Navicross catheter and Glidewire intra then used to cannulate the superior mesenteric vein. Mechanical thrombectomy was performed with the Revcore device through the central superior mesenteric vein into the main portal vein. Balloon angioplasty was then performed with a 6 mm x 80 mm Athletis balloon. Super mesenteric venogram was then performed which demonstrated significantly improved patency and hepatopetal flow through the superior mesenteric vein, main portal vein, and indwelling endograft. There is no evidence of reflux into the splenic vein. Therefore, the splenic vein was then cannulated and balloon angioplasty was performed with a 10 mm x 80 mm Athletis balloon. There is a type least near the portal confluence. Completion splenic venogram demonstrated 6 improved patency inflow the  splenic vein however still subchronic synechia centrally. There was a filling defect about the hepatic vein aspect of the indwelling endograft. Therefore, additional balloon angioplasty was performed centered about the hepatic vein aspect of the indwelling endograft. Completion venogram demonstrated significantly improved patency and antegrade flow through the indwelling endograft main portal vein. 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 The catheter and sheath were removed. Hemostasis was achieved with brief manual compression. A sterile bandage was applied. The patient tolerated the procedure well was transferred back to the floor in good condition. IMPRESSION: 1. Successful mechanical and aspiration thrombectomy of the TIPS, main portal vein, and central superior mesenteric, inferior mesenteric, and splenic veins. 2. Balloon angioplasty of the central superior mesenteric vein, splenic vein, and hepatic vein aspect of the indwelling endograft. 3. Completion venogram demonstrated significantly improved patency and antegrade flow through the indwelling endograft, main portal vein, and central superior mesenteric vein. 4. 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 PLAN: Recommend restarting systemic anticoagulation to preserve portal and TIPS patency. Interventional radiology will follow. Marliss Coots, MD [facility] Electronically Signed   By: Marliss Coots M.D.   On: 02/23/2023 21:22    Labs:  CBC: Recent Labs    02/22/23 0322 02/23/23 0311 02/24/23 0347 02/25/23 0016  WBC 3.8* 2.9* 3.8* 4.5  HGB 8.0* 8.0* 7.6* 7.6*  HCT 24.7* 25.1* 24.2* 23.7*  PLT 190 192 175 186    COAGS: Recent Labs    12/27/22 1923 01/29/23 1137 02/09/23 0404 02/18/23 0627 02/23/23 0311  INR 1.1 0.9 1.1 1.1 1.1  APTT 58*  --   --  29  --     BMP: Recent Labs    02/21/23 0054 02/22/23 0322 02/23/23 0311 02/25/23 0016  NA 136 140  139 135  K 3.3* 3.2* 3.9 3.0*  CL 111 113* 112* 111  CO2 20* 21* 19* 20*  GLUCOSE 117* 124* 113* 152*  BUN 6* 6* 6* 10  CALCIUM 7.6* 8.1* 8.2* 7.7*  CREATININE 0.92  0.83 0.87 0.86  GFRNONAA >60 >60 >60 >60    LIVER FUNCTION TESTS: Recent Labs    02/17/23 1103 02/17/23 1735 02/23/23 0311 02/25/23 0016  BILITOT 0.7 0.4 0.4 0.3  AST 40 50* 20 21  ALT 46* 52* 21 20  ALKPHOS 98 104 83 75  PROT 5.6* 5.7* 4.8* 4.4*  ALBUMIN 2.8* 2.7* 2.0* 1.9*    Assessment and Plan:  Pancreatic CA, recurrent UGIB s/p TIPS with aspiration and mechanical thrombectomy with drug coated balloon angioplasty of the portal and superior mesenteric veins and embolization of a left gastric vein 02/09/23. 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 She underwent TIPS revision with thrombectomy 02/23/23  Repeat CTA today shows  Veins: Preserved IVC. There has been placement of a tips shunt extending from the IVC just above the liver through the middle hepatic vein and then into the main portal vein. The tips shunt is occluded along its course. Is also thrombosis of the portal vein. This includes the central portions of the left and right portal vein as well as the main portal vein. 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 There are some secondary and tertiary branches of the SMV which are Patent.   These occlusions occurred with the patient on a continuous IV heparin infusing. These findings were discussed with the patient and the GI team. Unfortunately IR has nothing further to offer as repeat thrombectomies would likely be futile.  IR remains available as needed.   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

## 2023-02-25 NOTE — Progress Notes (Signed)
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  IR just spoke with her and told her the news-nothing more they can do.  Objective:  Vital signs in last 24 hours: Temp:  [98 F (36.7 C)-98.3 F (36.8 C)] 98.3 F (36.8 C) (01/31 0351) Pulse Rate:  [89-102] 102 (01/31 0351) Resp:  [18-20] 18 (01/31 0351) BP: (102-134)/(57-69) 134/68 (01/31 0351) SpO2:  [90 %-94 %] 94 % (01/31 0351) Weight:  [65.3 kg] 65.3 kg (01/31 0351) Last BM Date : 02/24/23 General:  Alert, elderly and chronically ill-appearing, in NAD  Intake/Output from previous day: 01/30 0701 - 01/31 0700 In: 319.2 [P.O.:60; I.V.:259.2] Out: -   Lab Results: Recent Labs    02/23/23 0311 02/24/23 0347 02/25/23 0016  WBC 2.9* 3.8* 4.5  HGB 8.0* 7.6* 7.6*  HCT 25.1* 24.2* 23.7*  PLT 192 175 186   BMET Recent Labs    02/23/23 0311 02/25/23 0016  NA 139 135  K 3.9 3.0*  CL 112* 111  CO2 19* 20*  GLUCOSE 113* 152*  BUN 6* 10  CREATININE 0.87 0.86  CALCIUM 8.2* 7.7*   LFT Recent Labs    02/25/23 0016  PROT 4.4*  ALBUMIN 1.9*  AST 21  ALT 20  ALKPHOS 75  BILITOT 0.3   PT/INR Recent Labs    02/23/23 0311  LABPROT 14.7  INR 1.1   CT ANGIO ABD/PELVIS BRTO Result Date: 02/25/2023 CLINICAL DATA:  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 Pancreatic cancer. * Tracking Code: BO * EXAM: CTA ABDOMEN AND PELVIS WITHOUT AND WITH CONTRAST TECHNIQUE: Multidetector CT imaging of the abdomen and pelvis was performed using the standard protocol during bolus administration of intravenous contrast. Multiplanar reconstructed images and MIPs were obtained and reviewed to evaluate the vascular anatomy. RADIATION DOSE REDUCTION: This exam was performed according to the departmental dose-optimization program which includes automated exposure  control, adjustment of the mA and/or kV according to patient size and/or use of iterative reconstruction technique. CONTRAST:  OMNIPAQUE IOHEXOL 350 MG/ML SOLN COMPARISON:  MRI 02/17/2023.  CT 02/06/2023. FINDINGS: VASCULAR Aorta: Thoracic aorta has a normal course and caliber with mild atherosclerotic calcified plaque. Celiac: Distal celiac is obscured by the streak artifact from the adjacent stent placement. The origin of the vessel has some minimal calcified plaque. Splenic artery and hepatic arteries are patent more distally. There intact branches of the distal left gastric. SMA: Patent without evidence of aneurysm, dissection, vasculitis or significant stenosis. Renals: Both renal arteries are patent without evidence of aneurysm, dissection, vasculitis, fibromuscular dysplasia or significant stenosis. IMA: Patent without evidence of aneurysm, dissection, vasculitis or significant stenosis. Inflow: Mild calcified plaque along the iliac vessels. Proximal Outflow: Slight calcified plaque along the common femoral and SFA vessels included in the imaging field. Veins: Preserved IVC. There has been placement of a tips shunt extending from the IVC just above the liver through the middle hepatic vein and then into the main portal vein. The tips shunt is occluded along its course. Is also thrombosis of the portal vein. This includes the central portions of the left and right portal vein as well as the main portal vein. 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 There are some secondary and tertiary branches of the SMV which are patent. Review of the MIP images confirms the above findings. NON-VASCULAR Lower  chest: Developing small bilateral pleural effusions with adjacent opacities. New small pericardial effusion. Hepatobiliary: Slightly heterogeneous appearing liver. Small benign-appearing hepatic cystic foci identified. Pancreas: Severe pancreatic atrophy with severe ductal dilatation in the neck, body and tail with mass  lesion again identified at the head neck region. Mass estimated in size at 3.1 by 2.0 cm. This involves the portal venous confluence. Is also involvement of the course of the GDA. Small bubble of air within a SMV branch. This could be postprocedural. Spleen: Spleen is actually with 2 separate components, nonspecific. Please correlate for any history of remote injury or other process. Adrenals/Urinary Tract: Stable adrenal glands. Parapelvic renal cysts identified. Ureters have normal course and caliber extending down to the urinary bladder. Preserved contour to the urinary bladder. Stomach/Bowel: On this non oral contrast exam large bowel is normal course and caliber. There is some scattered stool. Small bowel is nondilated. Stomach is underdistended but there is continued wall thickening and loss of normal fold pattern along the body antrum of the stomach. There is also some wall thickening along the proximal duodenal areas. High density intraluminal linear focus within the distal stomach. Of note there is some wall thickening along the distal esophagus with small hiatal hernia and some varices. Lymphatic: No specific abnormal lymph node enlargement in the abdomen and pelvis. Few prominent porta hepatic nodes are identified. Reproductive: Uterus and bilateral adnexa are unremarkable. Other: Developing mild-to-moderate ascites. Musculoskeletal: Significant streak artifact from the spinal fixation hardware. Moderate multilevel degenerative changes along the spine. Scattered degenerative changes of the pelvis. IMPRESSION: Placement of a tips shunt. The shunt is occluded. Additional stents in the area of the portal venous confluence. There is also occlusion of the port main portal vein and central mid right left polar veins. There continues to be occlusion of the SMV and first order branches. Secondary and tertiary branches of the SMV appear to be open. There is some air within a branch of the SMV along the left side  centrally which could be postprocedure related. Developing mild varices. Known pancreatic neoplasm with involvement of the portal venous confluence. Developing mild-to-moderate ascites, mesenteric stranding. No free intra-air. Developing bilateral pleural effusions adjacent opacities and a small pericardial effusion. Persistent wall thickening along the stomach and proximal duodenum with loss of fold pattern. No dilatation Electronically Signed   By: Karen Kays M.D.   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 EXAM: 1. Ultrasound-guided vascular access to the left internal jugular vein. 2. Selective catheterization and venography of the inferior mesenteric vein, superior mesenteric vein, and splenic veins via the indwelling TIPS 3. Mechanical and aspiration thrombectomy of the TIPS, main portal vein, and central superior mesenteric, inferior mesenteric, and splenic veins. 4. Balloon angioplasty of the central superior mesenteric vein and splenic vein. MEDICATIONS: 8000 units heparin, intravenous ANESTHESIA/SEDATION: Moderate (conscious) sedation was employed during this procedure. A total of Versed 1 mg and Fentanyl 200 mcg was administered intravenously. Moderate Sedation Time: 72 minutes. The patient's level of consciousness and vital signs were monitored continuously by radiology nursing throughout the procedure under my direct supervision. CONTRAST:  65 mL Omnipaque 300, intravenous FLUOROSCOPY TIME:  One hundred ninety mGy COMPLICATIONS: None immediate. PROCEDURE:  Informed written consent was obtained from the patient after a thorough discussion of  the procedural risks, benefits and alternatives. All questions were addressed. Maximal Sterile Barrier Technique was utilized including caps, mask, sterile gowns, sterile gloves, sterile drape, hand hygiene and skin antiseptic. A timeout was performed prior to the initiation of the procedure. Preprocedure ultrasound evaluation demonstrated patency of the left internal jugular vein. The procedure was planned. Subdermal Local anesthesia was administered with 1% lidocaine at the planned needle entry site. A small skin nick was made. Under direct ultrasound visualization, the left internal jugular vein was accessed with a 21 gauge micropuncture needle. A permanent ultrasound image was captured and stored in the record. A micropuncture sheath was introduced through which a Rosen wire was directed to the inferior vena cava. A 6 French destination sheath was then inserted and positioned in the right atrium. And in the a catheter was then directed to the hepatic vein ostium of the indwelling TIPS which was cannulated with moderate difficulty. A Rosen wire was then inserted and advanced level of the inferior mesenteric vein. Anatomy cross catheter was then placed over the wire and directed into the mid main inferior mesenteric vein. The wire was removed and inferior mesenteric venogram was performed which min straight it hepatofugal flow with occlusion of the inferior mesenteric vein centrally. The 6 French sheath was exchanged for a 16 French, 33 cm dry seal sheath. The sheath was advanced to the hepatic vein aspect of the indwelling endograft. Mechanical thrombectomy was then performed with the Revcore device through the central inferior mesenteric vein, main portal vein, and indwelling endograft. This was followed by aspiration thrombectomy with a straight 16 [place] flowtriever. There was small volume of chronic appearing thrombus aspirated. Balloon angioplasty was then performed throughout the TIPS and into the main  portal vein with a 10 mm x 80 mm Athletis balloon. Portal venogram was then performed which demonstrated an established channel a patency through the main portal vein and into the inferior mesenteric vein. A small aspect of the central superior mesenteric vein is identified. The Navicross catheter and Glidewire intra then used to cannulate the superior mesenteric vein. Mechanical thrombectomy was performed with the Revcore device through the central superior mesenteric vein into the main portal vein. Balloon angioplasty was then performed with a 6 mm x 80 mm Athletis balloon. Super mesenteric venogram was then performed which demonstrated significantly improved patency and hepatopetal flow through the superior mesenteric vein, main portal vein, and indwelling endograft. There is no evidence of reflux into the splenic vein. Therefore, the splenic vein was then cannulated and balloon angioplasty was performed with a 10 mm x 80 mm Athletis balloon. There is a type least near the portal confluence. Completion splenic venogram demonstrated 6 improved patency inflow the splenic vein however still subchronic synechia centrally. There was a filling defect about the hepatic vein aspect of the indwelling endograft. Therefore, additional balloon angioplasty was performed centered about the hepatic vein aspect of the indwelling endograft. Completion venogram demonstrated significantly improved patency and antegrade flow through the indwelling endograft main portal vein. 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 The catheter and sheath were removed. Hemostasis was achieved with brief manual compression. A sterile bandage was applied. The patient tolerated the procedure well was transferred back to the floor in good condition. IMPRESSION: 1. Successful mechanical and aspiration thrombectomy of the TIPS, main portal vein, and central superior mesenteric, inferior mesenteric, and splenic veins. 2. Balloon angioplasty of  the central  superior mesenteric vein, splenic vein, and hepatic vein aspect of the indwelling endograft. 3. Completion venogram demonstrated significantly improved patency and antegrade flow through the indwelling endograft, main portal vein, and central superior mesenteric vein. 4. 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 PLAN: Recommend restarting systemic anticoagulation to preserve portal and TIPS patency. Interventional radiology will follow. Marliss Coots, MD [facility] Electronically Signed   By: Marliss Coots M.D.   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 EXAM: 1. Ultrasound-guided vascular access to the left internal jugular vein. 2. Selective catheterization and venography of the inferior mesenteric vein, superior mesenteric vein, and splenic veins via the indwelling TIPS 3. Mechanical and aspiration thrombectomy of the TIPS, main portal vein, and central superior mesenteric, inferior mesenteric, and splenic veins. 4. Balloon angioplasty of the central superior mesenteric vein and splenic vein. MEDICATIONS: 8000 units heparin, intravenous ANESTHESIA/SEDATION: Moderate (conscious) sedation was employed during this procedure. A total of Versed 1 mg and Fentanyl 200 mcg was administered intravenously. Moderate Sedation Time: 72 minutes. The patient's level of consciousness and vital signs were monitored continuously by radiology nursing throughout the procedure under my direct supervision. CONTRAST:  65 mL Omnipaque 300, intravenous FLUOROSCOPY TIME:  One hundred ninety mGy  COMPLICATIONS: None immediate. PROCEDURE: Informed written consent was obtained from the patient after a thorough discussion of the procedural risks, benefits and alternatives. All questions were addressed. Maximal Sterile Barrier Technique was utilized including caps, mask, sterile gowns, sterile gloves, sterile drape, hand hygiene and skin antiseptic. A timeout was performed prior to the initiation of the procedure. Preprocedure ultrasound evaluation demonstrated patency of the left internal jugular vein. The procedure was planned. Subdermal Local anesthesia was administered with 1% lidocaine at the planned needle entry site. A small skin nick was made. Under direct ultrasound visualization, the left internal jugular vein was accessed with a 21 gauge micropuncture needle. A permanent ultrasound image was captured and stored in the record. A micropuncture sheath was introduced through which a Rosen wire was directed to the inferior vena cava. A 6 French destination sheath was then inserted and positioned in the right atrium. And in the a catheter was then directed to the hepatic vein ostium of the indwelling TIPS which was cannulated with moderate difficulty. A Rosen wire was then inserted and advanced level of the inferior mesenteric vein. Anatomy cross catheter was then placed over the wire and directed into the mid main inferior mesenteric vein. The wire was removed and inferior mesenteric venogram was performed which min straight it hepatofugal flow with occlusion of the inferior mesenteric vein centrally. The 6 French sheath was exchanged for a 16 French, 33 cm dry seal sheath. The sheath was advanced to the hepatic vein aspect of the indwelling endograft. Mechanical thrombectomy was then performed with the Revcore device through the central inferior mesenteric vein, main portal vein, and indwelling endograft. This was followed by aspiration thrombectomy with a straight 16 [place] flowtriever. There was small  volume of chronic appearing thrombus aspirated. Balloon angioplasty was then performed throughout the TIPS and into the main portal vein with a 10 mm x 80 mm Athletis balloon. Portal venogram was then performed which  demonstrated an established channel a patency through the main portal vein and into the inferior mesenteric vein. A small aspect of the central superior mesenteric vein is identified. The Navicross catheter and Glidewire intra then used to cannulate the superior mesenteric vein. Mechanical thrombectomy was performed with the Revcore device through the central superior mesenteric vein into the main portal vein. Balloon angioplasty was then performed with a 6 mm x 80 mm Athletis balloon. Super mesenteric venogram was then performed which demonstrated significantly improved patency and hepatopetal flow through the superior mesenteric vein, main portal vein, and indwelling endograft. There is no evidence of reflux into the splenic vein. Therefore, the splenic vein was then cannulated and balloon angioplasty was performed with a 10 mm x 80 mm Athletis balloon. There is a type least near the portal confluence. Completion splenic venogram demonstrated 6 improved patency inflow the splenic vein however still subchronic synechia centrally. There was a filling defect about the hepatic vein aspect of the indwelling endograft. Therefore, additional balloon angioplasty was performed centered about the hepatic vein aspect of the indwelling endograft. Completion venogram demonstrated significantly improved patency and antegrade flow through the indwelling endograft main portal vein. 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 The catheter and sheath were removed. Hemostasis was achieved with brief manual compression. A sterile bandage was applied. The patient tolerated the procedure well was transferred back to the floor in good condition. IMPRESSION: 1. Successful mechanical and aspiration thrombectomy of the  TIPS, main portal vein, and central superior mesenteric, inferior mesenteric, and splenic veins. 2. Balloon angioplasty of the central superior mesenteric vein, splenic vein, and hepatic vein aspect of the indwelling endograft. 3. Completion venogram demonstrated significantly improved patency and antegrade flow through the indwelling endograft, main portal vein, and central superior mesenteric vein. 4. 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 PLAN: Recommend restarting systemic anticoagulation to preserve portal and TIPS patency. Interventional radiology will follow. Marliss Coots, MD [facility] Electronically Signed   By: Marliss Coots M.D.   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 EXAM: 1. Ultrasound-guided vascular access to the left internal jugular vein. 2. Selective catheterization and venography of the inferior mesenteric vein, superior mesenteric vein, and splenic veins via the indwelling TIPS 3. Mechanical and aspiration thrombectomy of the TIPS, main portal vein, and central superior mesenteric, inferior mesenteric, and splenic veins. 4. Balloon angioplasty of the central superior mesenteric vein and splenic vein. MEDICATIONS: 8000 units heparin, intravenous ANESTHESIA/SEDATION: Moderate (conscious) sedation was employed during this procedure. A total of Versed 1 mg and Fentanyl 200 mcg was administered intravenously. Moderate Sedation Time: 72 minutes. The patient's level of consciousness and vital signs were monitored continuously by radiology nursing throughout the  procedure under my direct supervision. CONTRAST:  65 mL Omnipaque 300, intravenous FLUOROSCOPY TIME:  One hundred ninety mGy COMPLICATIONS: None immediate. PROCEDURE: Informed written consent was obtained from the patient after a thorough discussion of the procedural risks, benefits and alternatives. All questions were addressed. Maximal Sterile Barrier Technique was utilized including caps, mask, sterile gowns, sterile gloves, sterile drape, hand hygiene and skin antiseptic. A timeout was performed prior to the initiation of the procedure. Preprocedure ultrasound  evaluation demonstrated patency of the left internal jugular vein. The procedure was planned. Subdermal Local anesthesia was administered with 1% lidocaine at the planned needle entry site. A small skin nick was made. Under direct ultrasound visualization, the left internal jugular vein was accessed with a 21 gauge micropuncture needle. A permanent ultrasound image was captured and stored in the record. A micropuncture sheath was introduced through which a Rosen wire was directed to the inferior vena cava. A 6 French destination sheath was then inserted and positioned in the right atrium. And in the a catheter was then directed to the hepatic vein ostium of the indwelling TIPS which was cannulated with moderate difficulty. A Rosen wire was then inserted and advanced level of the inferior mesenteric vein. Anatomy cross catheter was then placed over the wire and directed into the mid main inferior mesenteric vein. The wire was removed and inferior mesenteric venogram was performed which min straight it hepatofugal flow with occlusion of the inferior mesenteric vein centrally. The 6 French sheath was exchanged for a 16 French, 33 cm dry seal sheath. The sheath was advanced to the hepatic vein aspect of the indwelling endograft. Mechanical thrombectomy was then performed with the Revcore device through the central inferior mesenteric vein, main portal vein,  and indwelling endograft. This was followed by aspiration thrombectomy with a straight 16 [place] flowtriever. There was small volume of chronic appearing thrombus aspirated. Balloon angioplasty was then performed throughout the TIPS and into the main portal vein with a 10 mm x 80 mm Athletis balloon. Portal venogram was then performed which demonstrated an established channel a patency through the main portal vein and into the inferior mesenteric vein. A small aspect of the central superior mesenteric vein is identified. The Navicross catheter and Glidewire intra then used to cannulate the superior mesenteric vein. Mechanical thrombectomy was performed with the Revcore device through the central superior mesenteric vein into the main portal vein. Balloon angioplasty was then performed with a 6 mm x 80 mm Athletis balloon. Super mesenteric venogram was then performed which demonstrated significantly improved patency and hepatopetal flow through the superior mesenteric vein, main portal vein, and indwelling endograft. There is no evidence of reflux into the splenic vein. Therefore, the splenic vein was then cannulated and balloon angioplasty was performed with a 10 mm x 80 mm Athletis balloon. There is a type least near the portal confluence. Completion splenic venogram demonstrated 6 improved patency inflow the splenic vein however still subchronic synechia centrally. There was a filling defect about the hepatic vein aspect of the indwelling endograft. Therefore, additional balloon angioplasty was performed centered about the hepatic vein aspect of the indwelling endograft. Completion venogram demonstrated significantly improved patency and antegrade flow through the indwelling endograft main portal vein. 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 The catheter and sheath were removed. Hemostasis was achieved with brief manual compression. A sterile bandage was applied. The patient tolerated the procedure well  was transferred back to the floor in good condition. IMPRESSION: 1. Successful mechanical and aspiration thrombectomy of the TIPS, main portal vein, and central superior mesenteric, inferior mesenteric, and splenic veins. 2. Balloon angioplasty of the central superior mesenteric vein, splenic vein, and hepatic vein aspect of the indwelling endograft. 3. Completion venogram demonstrated significantly improved patency and antegrade flow through the indwelling endograft, main portal vein, and central superior mesenteric vein. 4. 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 PLAN: Recommend restarting systemic anticoagulation to preserve portal and TIPS patency. Interventional radiology will  follow. Marliss Coots, MD [facility] Radiology Electronically Signed   By: Marliss Coots M.D.   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   Thrombectomy again 1/29 with TIPS angioplasty.  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  Time will tell.   She had to resume anticoagulation to hopefully decrease the chance of another thrombosis, but this clearly leads to the risk of rebleeding.  APC has not been successful thus far.  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   We are watching her hemoglobin daily and seeing whether it may hold and also allow a transition from unfractionated heparin to Lovenox.  Hgb is currently stable at 7.6 grams.   She is on pantoprazole and Carafate.   It looks like palliative care is being consulted/getting involved and the patient says that her son has made some inquiries about this/hospice.  That sounds appropriate given the status of her  malignancy and the other complications occurring from it as well as our significantly limited ability to help with this GI bleeding issue.  Follow-up:  IR repeated a CT scan and it shows that the TIPS has reoccluded along with other areas of clot. Heparin is not helping and should be stopped to reduce her risk of GI bleeding. Palliative care definitely needs to come see her and she would very much like to speak with them.  Dr. Myrtie Neither had long conversation with her at bedside.    LOS: 8 days   Princella Pellegrini. Zackrey Dyar  02/25/2023, 12:05 PM

## 2023-02-25 NOTE — TOC Progression Note (Signed)
Transition of Care [facility]) - Progression Note    Patient Details  Name: Sheryl Bender MRN: [id number] Date of Birth: [birth year]  Transition of Care [facility] CM/SW Contact  Jowanna Loeffler A [place], [place] Phone Number: 02/25/2023, 5:02 PM  Clinical Narrative:     CSW met with pt at bedside, and spoke with son, [name]. She stated understanding of possibility of facility for rehab versus home with care. Pt's son said that he could assist with help of care givers at home as well.  CSW informed her that palliative care was consulted to discuss goals of care and assist with clarifying plan for disposition.   She requested CSW send referrals for SNF to [place] or [place] area.   CSW to fax out pt for possible SNF placement once goals of care conversation has been completed.    TOC will continue to follow.        Expected Discharge Plan and Services                                               Social Determinants of Health (SDOH) Interventions SDOH Screenings   Food Insecurity: No Food Insecurity (02/18/2023)  Housing: Low Risk  (02/18/2023)  Transportation Needs: No Transportation Needs (02/18/2023)  Utilities: Not At Risk (02/18/2023)  Social Connections: Moderately Integrated (02/18/2023)  Tobacco Use: Low Risk  (02/18/2023)    Readmission Risk Interventions    02/21/2023   12:31 PM 01/31/2023   12:36 PM 01/14/2023    2:29 PM  Readmission Risk Prevention Plan  Transportation Screening Complete Complete Complete  Home Care Screening   Complete  Medication Review (RN CM)   Complete  HRI or Home Care Consult  Complete   Social Work Consult for Recovery Care Planning/Counseling  Complete   Palliative Care Screening  Not Applicable   Medication Review Oceanographer) Complete Complete   PCP or Specialist appointment within 3-5 days of discharge Complete    HRI or Home Care Consult Complete    SW Recovery Care/Counseling Consult Complete    Palliative Care Screening  Complete    Skilled Nursing Facility Patient Refused

## 2023-02-25 NOTE — Progress Notes (Signed)
My PA and I saw this patient just after she had been visited by the IR service.  Unfortunately, repeat CT angiogram today shows rethrombosis of the portal/mesenteric vascular thrombosis that was just treated earlier this week.  She remains on unfractionated heparin, and she had a maroon bowel movement just before we visited her.  She is aware that nothing more can be done for her at this point, and she is requesting a transfer to a hospice facility.  She has a son who lives near her in [place], but she lives alone in her house and her son travels for work.  So she does not feel that she could do home hospice. She seems comfortable with this decision and discusses her faith in God is a big factor helping her at this point.  I think her heparin should be stopped since it is clearly not preventing rethrombosis of the TIPS, and it is only worsening GI bleeding from a source for which we have no durable endoscopic solution.  We appreciate the opportunity to be involved in her care, and I will message the hospitalist with our thoughts about the heparin.   - [name], MD    [facility]

## 2023-02-25 NOTE — Progress Notes (Signed)
Physical Therapy Treatment Patient Details Name: Sheryl Bender MRN: [id number] DOB: [birth year] Today's Date: 02/25/2023   History of Present Illness 78 y.o. female admitted 1/23 with Recurrent GI bleeding. S/p EGD with hemostasis clip placement 1/24. S/p TIPS thrombectomy 1/29. 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  hospitalized from 02/06/2023-02/13/2023 with GI bleeding requiring EGD.    PT Comments  A little more trouble ambulating today, required min assist on a couple of occasions for balance and she feels more fatigued. States she is getting a transfusion of PRBCs today; the low Hgb may be contributing to increased difficulty with mobility. SpO2 94% and greater on RA throughout session, 95% ambulating, BP 147/77 in bed post-activity. Moderate dyspnea. She does not feel that she can adequately care for herself at home and would like to pursue post acute rehab near her home in [place], [place]. Recommendations updated to SNF based on current functional needs, and limited support at home. If symptoms improve greatly after transfusion and pt feels capable to caring for her self, may update recs to HHPT but currently feel it would be best to plan for SNF at this point. She also appears more distended today.    If plan is discharge home, recommend the following: Assist for transportation;Assistance with cooking/housework;A little help with walking and/or transfers;A little help with bathing/dressing/bathroom;Help with stairs or ramp for entrance   Can travel by private vehicle     Yes  Equipment Recommendations  None recommended by PT    Recommendations for Other Services       Precautions / Restrictions Precautions Precautions: Fall Restrictions Weight Bearing Restrictions Per  Provider Order: No     Mobility  Bed Mobility Overal bed mobility: Modified Independent             General bed mobility comments: Extra time, effortful, no physical assist.    Transfers Overall transfer level: Needs assistance Equipment used: None Transfers: Sit to/from Stand Sit to Stand: Supervision           General transfer comment: Supervision for safety, mild sway able to self correct, declines RW for support.    Ambulation/Gait Ambulation/Gait assistance: Min assist Gait Distance (Feet): 150 Feet Assistive device: None Gait Pattern/deviations: Step-through pattern, Decreased stride length, Drifts right/left, Staggering left Gait velocity: decr Gait velocity interpretation: 1.31 - 2.62 ft/sec, indicative of limited community ambulator   General Gait Details: A couple of instances of staggering, required min assist to correct. Pt feels more fatigued than prior visit but puts forth good effort. Moderate DOE returning to room.SpO2 95% on RA, BP 147/77. Educated on safety, awarweness, pacing and energy conservation.   Stairs             Wheelchair Mobility     Tilt Bed    Modified Rankin (Stroke Patients Only)       Balance Overall balance assessment: Needs assistance Sitting-balance support: No upper extremity supported, Feet supported Sitting balance-Leahy Scale: Good Sitting balance - Comments: sitting EOB   Standing balance support: No upper extremity supported, During functional activity Standing balance-Leahy Scale: Fair                              Cognition Arousal: Alert Behavior During Therapy: WFL for tasks assessed/performed Overall Cognitive Status: Within Functional Limits for tasks assessed  Exercises      General Comments General comments (skin integrity, edema, etc.): Pre-activity in bed, BP 128/65 HR 87. Post activity BP in bed 147/77. SpO2 maintained  94% and greater at rest and with activity on RA.      Pertinent Vitals/Pain Pain Assessment Pain Assessment: Faces Faces Pain Scale: Hurts a little bit Pain Location: abdomen Pain Descriptors / Indicators: Discomfort, Pressure Pain Intervention(s): Monitored during session, Repositioned    Home Living                          Prior Function            PT Goals (current goals can now be found in the care plan section) Acute Rehab PT Goals Patient Stated Goal: get stronger PT Goal Formulation: With patient Time For Goal Achievement: 03/11/23 Potential to Achieve Goals: Fair Progress towards PT goals: Progressing toward goals    Frequency    Min 1X/week      PT Plan      Co-evaluation              AM-PAC PT "6 Clicks" Mobility   Outcome Measure  Help needed turning from your back to your side while in a flat bed without using bedrails?: None Help needed moving from lying on your back to sitting on the side of a flat bed without using bedrails?: None Help needed moving to and from a bed to a chair (including a wheelchair)?: A Little Help needed standing up from a chair using your arms (e.g., wheelchair or bedside chair)?: A Little Help needed to walk in hospital room?: A Little Help needed climbing 3-5 steps with a railing? : A Little 6 Click Score: 20    End of Session Equipment Utilized During Treatment: Gait belt Activity Tolerance: Patient tolerated treatment well;Patient limited by fatigue Patient left: with call bell/phone within reach;in bed;with bed alarm set;with SCD's reapplied Nurse Communication: Mobility status PT Visit Diagnosis: Unsteadiness on feet (R26.81);Muscle weakness (generalized) (M62.81);Other abnormalities of gait and mobility (R26.89)     Time: 1610-9604 PT Time Calculation (min) (ACUTE ONLY): 19 min  Charges:    $Gait Training: 8-22 mins PT General Charges $$ ACUTE PT VISIT: 1 Visit                     Kathlyn Sacramento, PT, DPT [facility] Health  Rehabilitation Services Physical Therapist Office: [phone] Website: [email or web address]    Berton Mount 02/25/2023, 12:41 PM

## 2023-02-25 NOTE — Progress Notes (Signed)
PHARMACY - ANTICOAGULATION CONSULT NOTE  Pharmacy Consult for heparin infusion (PTA lovenox) Indication: history of pulmonary embolus  Allergies  Allergen Reactions   Other Hives and Other (See Comments)    Cherry wood just cut- "smelled it and broke out"; cannot tolerate ANY cherry fragrances, either      Cherry Hives   Wound Dressing Adhesive Rash and Other (See Comments)    Band-Aids = local reaction    Patient Measurements: Height: 5' 2.5" (158.8 cm) Weight: 65.3 kg (143 lb 15.4 oz) IBW/kg (Calculated) : 51.25 Heparin Dosing Weight: 59 kg  Vital Signs: Temp: 98.3 F (36.8 C) (01/31 0351) BP: 134/68 (01/31 0351) Pulse Rate: 102 (01/31 0351)  Labs: Recent Labs    02/23/23 0311 02/24/23 0347 02/24/23 1530 02/25/23 0016  HGB 8.0* 7.6*  --  7.6*  HCT 25.1* 24.2*  --  23.7*  PLT 192 175  --  186  LABPROT 14.7  --   --   --   INR 1.1  --   --   --   HEPARINUNFRC  --  0.40 0.36 0.31  CREATININE 0.87  --   --  0.86    Estimated Creatinine Clearance: 47.6 mL/min (by C-G formula based on SCr of 0.86 mg/dL).  Assessment: 75 YOF admitted 02/17/23 with bright red blood per rectum x1 episode. Patient was recently hospitalized from 1/12 to 1/19 with GI bleed and SMV vein thrombosis and underwent TIPS with aspiration of thrombosis and SMV angioplasty plus embolization of left gastric vein by IR 1/15 and was discharged on 1 month of Lovenox with a plan to eventually transition to Eliquis. Last dose of Lovenox was on 02/17/23.  Patient is status post TIPS thrombectomy (completed 1/29 ~1600; estimated blood loss 100 mL). Consult order received to start heparin infusion without a bolus for history of PE.   02/25/23: Heparin level remains therapeutic (0.31) on infusion at 950 units/hr. No bleeding noted.   HGb 7.6 stable, pltc wnl / stable.  Repeat CT angio on 1/31 with occluded shunts, pulm portal vein and SMV.   Goal of Therapy:  Heparin level 0.3-0.7 units/ml Monitor platelets  by anticoagulation protocol: Yes   Plan:  Continue heparin 950 units/hr.  Daily heparin level and CBC Follow-up plans for oral anticoagulant.   Noah Delaine, RPh Clinical Pharmacist Please see amion for complete clinical pharmacist phone list 02/25/2023 1:32 PM

## 2023-02-25 NOTE — Progress Notes (Signed)
PROGRESS NOTE  Sheryl Bender KVQ:[id number] DOB: 06/24/1945   PCP: [name], DO  Patient is from: Home.  Independently ambulates at baseline.  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   Patient was hospitalized 1/12-1/19 GI bleed and SMV vein thrombosis.  She underwent TIPS procedure, aspiration of portal vein thrombus and SMV balloon angioplasty along with embolization of left gastric vein by IR on 1/15 and discharged on Lovenox for 1 months with a plan to transition to Eliquis afterward.  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  She was discharged on PPI and Carafate.  Anticoagulation held.  GI and IR consulted this hospitalization.  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  EGD on 1/24 showed multiple gastric ulcers, 2 with adherent clots that were treated endoscopically, gastric antral vascular ectasia, erythematous duodenopathy and moderate hiatal hernia.  GI recommended holding anticoagulation.  IR consulted and patient had TI PC revision with aspiration thrombectomy and balloon angioplasty on 1/29.  Anticoagulation resumed with IV heparin.   Repeat CT angio on 1/31 with occluded shunts, pulm portal vein and SMV.   Subjective: Seen and  examined earlier this morning.  No major events overnight of this morning.  She reports dark watery stool and and gas.  Still with some cough but no shortness of breath.   Objective: Vitals:   02/24/23 1601 02/24/23 1627 02/24/23 1956 02/25/23 0351  BP: (!) 113/57 (!) 102/59 119/69 134/68  Pulse: 91 89 92 (!) 102  Resp: 18 18 20 18   Temp: 98.2 F (36.8 C) 98 F (36.7 C) 98.2 F (36.8 C) 98.3 F (36.8 C)  TempSrc: Oral     SpO2: 90% 93% 93% 94%  Weight:    65.3 kg  Height:        Examination:  GENERAL: No apparent distress.  Nontoxic. HEENT: MMM.  Vision and hearing grossly intact.  NECK: Supple.  No apparent JVD.  RESP:  No IWOB.  Fair aeration bilaterally.  CVS:  RRR. Heart sounds normal.  ABD/GI/GU: BS+. Abd soft, NTND.  MSK/EXT:  Moves extremities. No apparent deformity. No edema.  SKIN: no apparent skin lesion or wound NEURO: Awake, alert and oriented appropriately.  No apparent focal neuro deficit. PSYCH: Calm. Normal affect.   Procedures:  1/24-EGD showed multiple gastric ulcers, 2 with adherent clots that were treated endoscopically, gastric antral vascular ectasia, erythematous duodenopathy and moderate hiatal hernia.  Microbiology summarized: None  Assessment and plan: Recurrent GI bleeding: Returns with bright red blood per rectum.  Patient is on Lovenox for the PE/DVT/SMV thrombosis.  Had extensive evaluation including EGD and colonoscopy last hospitalization.  Repeat EGD on 1/24 with multiple gastric ulcers (treated endoscopically), gastric antral vascular ectasia, erythematous duodenopathy and moderate hiatal hernia.  Hgb 9.6 on admission (was 9.2 when discharge on 1/19).  H&H  relatively stable now.  Anemia panel without significant nutritional deficiency. Recent Labs    02/19/23 0956 02/19/23 1555 02/19/23 2247 02/20/23 0420 02/21/23 0054 02/21/23 1059 02/22/23 0322 02/23/23 0311 02/24/23 0347 02/25/23 0016  HGB 8.6* 8.1* 7.3* 7.3* 7.5* 8.1* 8.0* 8.0*  7.6* 7.6*  -Appreciate GI recs:  Protonix, Carafate 4 times daily for 2 weeks... -Monitor H&H.   -Would benefit from 1 unit before discharge.   History of PE/DVT/SMV thrombosis: had TIPS, aspiration of portal vein thrombus, SMV balloon angioplasty along with embolization of left gastric vein by IR on 1/15 and discharged home on Lovenox at least for 1 month followed by Eliquis.   MRI abdomen with occluded TIPS, portal vein and SMV.  Underwent  TIPS thrombectomy, conical aspiration and thrombectomy of of portal, SM, IM and splenic veins and balloon angioplasty of SM and splenic vein on 1/29.  Anticoagulation resumed with IV heparin afterward.  Repeat CT angio on 1/31 with occluded shunts, pulm portal vein and SMV. -Will follow IR recommendation -Consulted palliative care as well  Acute respiratory failure with hypoxia/possible aspiration pneumonia: CXR 1/24 showed multifocal pneumonia.  No shortness of breath but frequent cough.  She also reports postnasal drainage. -Continue IV Zosyn for a total of 5 days. -Mucolytic's and antitussive -Continue Claritin. -Encourage incentive telemetry   Malignant neoplasm of pancreas: s/p chemoradiation.  Followed by [name].  Currently being monitored off chemo for the last 3 months. She also went to [facility] for a Whipple's procedure but it could not be done due to the location of her tumor. Unfortunately CA 19.9 is now trending up and currently quite high.  MRI abdomen with stable pancreatic head mass.  Case was recently discussed during last hospitalization by hospitalist with [name] oncologist on 02/09/2023, also discussed her case with her primary oncologist [name] on 02/10/2023: Outpatient follow-up with oncology was advised at that time. -Outpatient follow-up with oncology. -Consulted palliative medicine   Anxiety/depression: Stable -Continue bupropion, buspirone, escitalopram and mirtazapine   Leukopenia: Unclear cause of this.   Resolved. -Continue monitoring   Hypokalemia: Likely due to diarrhea. -Monitor replenish as appropriate  Non-anion gap metabolic acidosis: Mild -Continue monitoring   Thrombocytopenia: Resolved   Body mass index is 25.91 kg/m.           DVT prophylaxis:  SCDs Start: 02/17/23 2241  Code Status: Full code Family Communication: None at bedside. Level of care: Med-Surg Status is: Inpatient Remains inpatient appropriate because: Recurrent GI bleed, occluded TIPS   Final disposition: Home Consultants:  Gastroenterology Interventional radiology Palliative medicine  55 minutes with more than 50% spent in reviewing records, counseling patient/family and coordinating care.   Sch Meds:  Scheduled Meds:  buPROPion  300 mg Oral QPC breakfast   busPIRone  20 mg Oral BID   Chlorhexidine Gluconate Cloth  6 each Topical Daily   cholecalciferol  10,000 Units Oral q morning   escitalopram  20 mg Oral QHS   guaiFENesin  600 mg Oral BID   loratadine  10 mg Oral Daily   mirtazapine  15 mg Oral QHS   pantoprazole (PROTONIX) IV  40 mg Intravenous Q12H   potassium chloride  40 mEq Oral Q3H   QUEtiapine  25 mg Oral BID   sodium chloride flush  10-40 mL Intracatheter Q12H   sodium chloride flush  3 mL Intravenous Q12H   sucralfate  1 g Oral TID WC & HS   Continuous Infusions:  heparin 950 Units/hr (02/25/23 0616)  PRN Meds:.acetaminophen **OR** acetaminophen, calcium carbonate, guaiFENesin-dextromethorphan, iohexol, LORazepam, ondansetron (ZOFRAN) IV, polyethylene glycol, simethicone, sodium chloride flush  Antimicrobials: Anti-infectives (From admission, onward)    Start     Dose/Rate Route Frequency Ordered Stop   02/23/23 0000  cefTRIAXone (ROCEPHIN) 2 g in sodium chloride 0.9 % 100 mL IVPB        2 g 200 mL/hr over 30 Minutes Intravenous To Radiology 02/22/23 1244 02/24/23 0000   02/19/23 0200  piperacillin-tazobactam (ZOSYN) IVPB 3.375 g  Status:  Discontinued         3.375 g 12.5 mL/hr over 240 Minutes Intravenous Every 8 hours 02/19/23 0037 02/24/23 1459        I have personally reviewed the following labs and images: CBC: Recent Labs  Lab 02/19/23 0525 02/19/23 0956 02/20/23 0420 02/21/23 0054 02/21/23 1059 02/22/23 0322 02/23/23 0311 02/24/23 0347 02/25/23 0016  WBC 8.9  --  7.1 5.4  --  3.8* 2.9* 3.8* 4.5  NEUTROABS 8.3*  --  6.2 4.4  --  2.8 1.9  --   --   HGB 8.2*   < > 7.3* 7.5* 8.1* 8.0* 8.0* 7.6* 7.6*  HCT 25.1*   < > 23.3* 23.6* 24.9* 24.7* 25.1* 24.2* 23.7*  MCV 91.3  --  92.8 92.2  --  91.5 91.6 91.7 91.5  PLT 149*  --  150 175  --  190 192 175 186   < > = values in this interval not displayed.   BMP &GFR Recent Labs  Lab 02/20/23 0420 02/21/23 0054 02/22/23 0322 02/23/23 0311 02/25/23 0016  NA 142 136 140 139 135  K 3.5 3.3* 3.2* 3.9 3.0*  CL 113* 111 113* 112* 111  CO2 21* 20* 21* 19* 20*  GLUCOSE 99 117* 124* 113* 152*  BUN 10 6* 6* 6* 10  CREATININE 0.88 0.92 0.83 0.87 0.86  CALCIUM 7.7* 7.6* 8.1* 8.2* 7.7*  MG 2.3 2.1 1.9 1.9 1.8   Estimated Creatinine Clearance: 47.6 mL/min (by C-G formula based on SCr of 0.86 mg/dL). Liver & Pancreas: Recent Labs  Lab 02/23/23 0311 02/25/23 0016  AST 20 21  ALT 21 20  ALKPHOS 83 75  BILITOT 0.4 0.3  PROT 4.8* 4.4*  ALBUMIN 2.0* 1.9*   No results for input(s): "LIPASE", "AMYLASE" in the last 168 hours. No results for input(s): "AMMONIA" in the last 168 hours. Diabetic: No results for input(s): "HGBA1C" in the last 72 hours. No results for input(s): "GLUCAP" in the last 168 hours. Cardiac Enzymes: No results for input(s): "CKTOTAL", "CKMB", "CKMBINDEX", "TROPONINI" in the last 168 hours. No results for input(s): "PROBNP" in the last 8760 hours. Coagulation Profile: Recent Labs  Lab 02/23/23 0311  INR 1.1   Thyroid Function Tests: No results for input(s): "TSH", "T4TOTAL", "FREET4", "T3FREE", "THYROIDAB" in the last 72 hours. Lipid Profile: No results for  input(s): "CHOL", "HDL", "LDLCALC", "TRIG", "CHOLHDL", "LDLDIRECT" in the last 72 hours. Anemia Panel: Recent Labs    02/24/23 0345 02/24/23 1530  VITAMINB12  --  1,326*  FOLATE  --  19.4  FERRITIN  --  112  TIBC  --  210*  IRON  --  22*  RETICCTPCT 1.5  --    Urine analysis:    Component Value Date/Time   COLORURINE YELLOW 05/06/2022 1225   APPEARANCEUR CLEAR 05/06/2022 1225   LABSPEC 1.017 05/06/2022 1225   PHURINE 5.0 05/06/2022 1225   GLUCOSEU NEGATIVE 05/06/2022 1225   HGBUR NEGATIVE 05/06/2022 1225   BILIRUBINUR NEGATIVE  05/06/2022 1225   KETONESUR NEGATIVE 05/06/2022 1225   PROTEINUR NEGATIVE 05/06/2022 1225   NITRITE NEGATIVE 05/06/2022 1225   LEUKOCYTESUR NEGATIVE 05/06/2022 1225   Sepsis Labs: Invalid input(s): "PROCALCITONIN", "LACTICIDVEN"  Microbiology: No results found for this or any previous visit (from the past 240 hours).  Radiology Studies: CT ANGIO ABD/PELVIS BRTO Result Date: 02/25/2023 CLINICAL DATA:  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 Pancreatic cancer. * Tracking Code: BO * EXAM: CTA ABDOMEN AND PELVIS WITHOUT AND WITH CONTRAST TECHNIQUE: Multidetector CT imaging of the abdomen and pelvis was performed using the standard protocol during bolus administration of intravenous contrast. Multiplanar reconstructed images and MIPs were obtained and reviewed to evaluate the vascular anatomy. RADIATION DOSE REDUCTION: This exam was performed according to the departmental dose-optimization program which includes automated exposure control, adjustment of the mA and/or kV according to patient size and/or use of iterative reconstruction technique. CONTRAST:  OMNIPAQUE IOHEXOL 350 MG/ML SOLN COMPARISON:  MRI 02/17/2023.  CT 02/06/2023. FINDINGS: VASCULAR Aorta: Thoracic aorta has a normal course and caliber with mild atherosclerotic calcified plaque. Celiac: Distal celiac is obscured by the streak artifact from the adjacent  stent placement. The origin of the vessel has some minimal calcified plaque. Splenic artery and hepatic arteries are patent more distally. There intact branches of the distal left gastric. SMA: Patent without evidence of aneurysm, dissection, vasculitis or significant stenosis. Renals: Both renal arteries are patent without evidence of aneurysm, dissection, vasculitis, fibromuscular dysplasia or significant stenosis. IMA: Patent without evidence of aneurysm, dissection, vasculitis or significant stenosis. Inflow: Mild calcified plaque along the iliac vessels. Proximal Outflow: Slight calcified plaque along the common femoral and SFA vessels included in the imaging field. Veins: Preserved IVC. There has been placement of a tips shunt extending from the IVC just above the liver through the middle hepatic vein and then into the main portal vein. The tips shunt is occluded along its course. Is also thrombosis of the portal vein. This includes the central portions of the left and right portal vein as well as the main portal vein. 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 There are some secondary and tertiary branches of the SMV which are patent. Review of the MIP images confirms the above findings. NON-VASCULAR Lower chest: Developing small bilateral pleural effusions with adjacent opacities. New small pericardial effusion. Hepatobiliary: Slightly heterogeneous appearing liver. Small benign-appearing hepatic cystic foci identified. Pancreas: Severe pancreatic atrophy with severe ductal dilatation in the neck, body and tail with mass lesion again identified at the head neck region. Mass estimated in size at 3.1 by 2.0 cm. This involves the portal venous confluence. Is also involvement of the course of the GDA. Small bubble of air within a SMV branch. This could be postprocedural. Spleen: Spleen is actually with 2 separate components, nonspecific. Please correlate for any history of remote injury or other process. Adrenals/Urinary  Tract: Stable adrenal glands. Parapelvic renal cysts identified. Ureters have normal course and caliber extending down to the urinary bladder. Preserved contour to the urinary bladder. Stomach/Bowel: On this non oral contrast exam large bowel is normal course and caliber. There is some scattered stool. Small bowel is nondilated. Stomach is underdistended but there is continued wall thickening and loss of normal fold pattern along the body antrum of the stomach. There is also some wall thickening along the proximal duodenal areas. High density intraluminal linear focus within the distal stomach. Of note there is some wall thickening along the distal esophagus with  small hiatal hernia and some varices. Lymphatic: No specific abnormal lymph node enlargement in the abdomen and pelvis. Few prominent porta hepatic nodes are identified. Reproductive: Uterus and bilateral adnexa are unremarkable. Other: Developing mild-to-moderate ascites. Musculoskeletal: Significant streak artifact from the spinal fixation hardware. Moderate multilevel degenerative changes along the spine. Scattered degenerative changes of the pelvis. IMPRESSION: Placement of a tips shunt. The shunt is occluded. Additional stents in the area of the portal venous confluence. There is also occlusion of the port main portal vein and central mid right left polar veins. There continues to be occlusion of the SMV and first order branches. Secondary and tertiary branches of the SMV appear to be open. There is some air within a branch of the SMV along the left side centrally which could be postprocedure related. Developing mild varices. Known pancreatic neoplasm with involvement of the portal venous confluence. Developing mild-to-moderate ascites, mesenteric stranding. No free intra-air. Developing bilateral pleural effusions adjacent opacities and a small pericardial effusion. Persistent wall thickening along the stomach and proximal duodenum with loss of fold  pattern. No dilatation Electronically Signed   By: Karen Kays M.D.   On: 02/25/2023 10:39      Montarius Kitagawa T. Katelyn Kohlmeyer [facility]  If 7PM-7AM, please contact night-coverage [email or web address] 02/25/2023, 11:40 AM

## 2023-02-26 DIAGNOSIS — C250 Malignant neoplasm of head of pancreas: Secondary | ICD-10-CM | POA: Diagnosis not present

## 2023-02-26 DIAGNOSIS — K31819 Angiodysplasia of stomach and duodenum without bleeding: Secondary | ICD-10-CM | POA: Diagnosis not present

## 2023-02-26 DIAGNOSIS — K55069 Acute infarction of intestine, part and extent unspecified: Secondary | ICD-10-CM | POA: Diagnosis not present

## 2023-02-26 DIAGNOSIS — D62 Acute posthemorrhagic anemia: Secondary | ICD-10-CM | POA: Diagnosis not present

## 2023-02-26 LAB — CBC
HCT: 25.1 % — ABNORMAL LOW (ref 36.0–46.0)
Hemoglobin: 7.9 g/dL — ABNORMAL LOW (ref 12.0–15.0)
MCH: 28.9 pg (ref 26.0–34.0)
MCHC: 31.5 g/dL (ref 30.0–36.0)
MCV: 91.9 fL (ref 80.0–100.0)
Platelets: 227 10*3/uL (ref 150–400)
RBC: 2.73 MIL/uL — ABNORMAL LOW (ref 3.87–5.11)
RDW: 18.2 % — ABNORMAL HIGH (ref 11.5–15.5)
WBC: 4.5 10*3/uL (ref 4.0–10.5)
nRBC: 0 % (ref 0.0–0.2)

## 2023-02-26 NOTE — Progress Notes (Signed)
Physical Therapy Treatment Patient Details Name: Sheryl Bender MRN: [id number] DOB: [birth year] Today's Date: 02/26/2023   History of Present Illness 78 y.o. female admitted 1/23 with Recurrent GI bleeding. S/p EGD with hemostasis clip placement 1/24. S/p TIPS thrombectomy 1/29. Repeat CT angio on 1/31 with occluded shunts, pulm portal vein and SMV. 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  hospitalized from 02/06/2023-02/13/2023 with GI bleeding requiring EGD.    PT Comments  Moving better again today. Completed 2 bouts of 150 feet without assistive device. Still shows mild instability requiring CGA for safety but was able to self correct today and did not have any episodes of overt LOB. Family searching for aide to assist pt at home and will consider HHPT if this can be arranged. Otherwise SNF remains appropriate for rehab and supervision. Will continue to follow and progress during admission.    If plan is discharge home, recommend the following: Assist for transportation;Assistance with cooking/housework;A little help with walking and/or transfers;A little help with bathing/dressing/bathroom;Help with stairs or ramp for entrance   Can travel by private vehicle     Yes  Equipment Recommendations  None recommended by PT    Recommendations for Other Services       Precautions / Restrictions Precautions Precautions: Fall Restrictions Weight Bearing Restrictions Per Provider Order: No     Mobility  Bed Mobility Overal bed mobility: Modified Independent             General bed mobility comments: Extra time, effortful, no physical assist.    Transfers Overall transfer level: Needs assistance Equipment used: None Transfers: Sit to/from Stand Sit to Stand:  Supervision           General transfer comment: Supervision for safety, mild sway able to self correct, declines RW for support. Performed at mod I level from toilet with railing.    Ambulation/Gait Ambulation/Gait assistance: Contact guard assist Gait Distance (Feet): 150 Feet (x2) Assistive device: None Gait Pattern/deviations: Step-through pattern, Decreased stride length, Drifts right/left Gait velocity: decr Gait velocity interpretation: <1.8 ft/sec, indicate of risk for recurrent falls   General Gait Details: Minor instability but self corrects today. moderate dyspnea, recovers with seated rest break. Second lap pt able to pace herself better and was less fatigued. Cues for awareness of symptoms, and energy conservation.   Stairs             Wheelchair Mobility     Tilt Bed    Modified Rankin (Stroke Patients Only)       Balance Overall balance assessment: Needs assistance Sitting-balance support: No upper extremity supported, Feet supported Sitting balance-Leahy Scale: Good Sitting balance - Comments: sitting EOB   Standing balance support: No upper extremity supported, During functional activity Standing balance-Leahy Scale: Fair                              Cognition Arousal: Alert Behavior During Therapy: WFL for tasks assessed/performed Overall Cognitive Status: Within Functional Limits for tasks assessed                                          Exercises General Exercises - Lower Extremity Ankle Circles/Pumps: AROM, Both, 10 reps, Supine  General Comments General comments (skin integrity, edema, etc.): She discussed dispo; looking into home aide      Pertinent Vitals/Pain Pain Assessment Pain Assessment: Faces Faces Pain Scale: Hurts little more Pain Location: abdomen Pain Descriptors / Indicators: Discomfort, Pressure Pain Intervention(s): Monitored during session    Home Living                           Prior Function            PT Goals (current goals can now be found in the care plan section) Acute Rehab PT Goals Patient Stated Goal: get stronger PT Goal Formulation: With patient Time For Goal Achievement: 03/11/23 Potential to Achieve Goals: Fair Progress towards PT goals: Progressing toward goals    Frequency    Min 1X/week      PT Plan      Co-evaluation              AM-PAC PT "6 Clicks" Mobility   Outcome Measure  Help needed turning from your back to your side while in a flat bed without using bedrails?: None Help needed moving from lying on your back to sitting on the side of a flat bed without using bedrails?: None Help needed moving to and from a bed to a chair (including a wheelchair)?: A Little Help needed standing up from a chair using your arms (e.g., wheelchair or bedside chair)?: A Little Help needed to walk in hospital room?: A Little Help needed climbing 3-5 steps with a railing? : A Little 6 Click Score: 20    End of Session Equipment Utilized During Treatment: Gait belt Activity Tolerance: Patient tolerated treatment well Patient left: with call bell/phone within reach;in bed;with bed alarm set;with SCD's reapplied Nurse Communication: Mobility status PT Visit Diagnosis: Unsteadiness on feet (R26.81);Muscle weakness (generalized) (M62.81);Other abnormalities of gait and mobility (R26.89)     Time: 4098-1191 PT Time Calculation (min) (ACUTE ONLY): 22 min  Charges:    $Gait Training: 8-22 mins PT General Charges $$ ACUTE PT VISIT: 1 Visit                     Kathlyn [place], PT, DPT [facility]  Rehabilitation Services Physical Therapist Office: [phone] Website: [email or web address]    Berton Mount 02/26/2023, 4:29 PM

## 2023-02-26 NOTE — Progress Notes (Signed)
PROGRESS NOTE  Sheryl Bender UXL:[id number] DOB: 04/18/1945   PCP: [name], DO  Patient is from: Home.  Independently ambulates at baseline.  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   Patient was hospitalized 1/12-1/19 GI bleed and SMV vein thrombosis.  She underwent TIPS procedure, aspiration of portal vein thrombus and SMV balloon angioplasty along with embolization of left gastric vein by IR on 1/15 and discharged on Lovenox for 1 months with a plan to transition to Eliquis afterward.  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  She was discharged on PPI and Carafate.  Anticoagulation held.  GI and IR consulted this hospitalization.  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  EGD on 1/24 showed multiple gastric ulcers, 2 with adherent clots that were treated endoscopically, gastric antral vascular ectasia, erythematous duodenopathy and moderate hiatal hernia.  GI recommended holding anticoagulation.  IR consulted and patient had TIPS revision with aspiration thrombectomy and balloon angioplasty on 1/29.  Anticoagulation resumed with IV heparin. Repeat CT angio on 1/31 with occluded shunts, pulm portal vein and SMV.  Repeat thrombectomy is felt to be  futile.  IR nothing further to offer.  GI recommended discontinuing anticoagulation and palliative consult.  Palliative consulted.    Subjective: Seen and examined earlier this morning.  No major events overnight of this morning.  Feels bloated.  She think this is because of the pancreas and she has been trying not to eat to see if this helps.  Also reports intermittent cough.  She said Tessalon Perles helped in the past.   Objective: Vitals:   02/25/23 1921 02/25/23 2047 02/26/23 0500 02/26/23 0749  BP: 123/64 126/68 129/75 129/76  Pulse: 99 (!) 101 99 95  Resp: 17 18 18 18   Temp: 99.2 F (37.3 C) 98.6 F (37 C) 98.2 F (36.8 C) 98.6 F (37 C)  TempSrc:  Oral Oral   SpO2: 92% 93% 95% 91%  Weight:      Height:        Examination:  GENERAL: No apparent distress.  Nontoxic. HEENT: MMM.  Vision and hearing grossly intact.  NECK: Supple.  No apparent JVD.  RESP:  No IWOB.  Fair aeration bilaterally.  CVS:  RRR. Heart sounds normal.  ABD/GI/GU: BS+. Abd soft, NTND.  MSK/EXT:  Moves extremities. No apparent deformity. No edema.  SKIN: no apparent skin lesion or wound NEURO: Awake, alert and oriented appropriately.  No apparent focal neuro deficit. PSYCH: Calm. Normal affect.   Procedures:  1/24-EGD showed multiple gastric ulcers, 2 with adherent clots that were treated endoscopically, gastric antral vascular ectasia, erythematous duodenopathy and moderate hiatal hernia.  Microbiology summarized: None  Assessment and plan: Recurrent GI bleeding: Returns with bright red blood per rectum.  Patient is on Lovenox for the PE/DVT/SMV thrombosis.  Had extensive evaluation  including EGD and colonoscopy last hospitalization.  Repeat EGD on 1/24 with multiple gastric ulcers (treated endoscopically), gastric antral vascular ectasia, erythematous duodenopathy and moderate hiatal hernia.  Hgb 9.6 on admission (was 9.2 when discharge on 1/19).  H&H relatively stable now.  Anemia panel without  significant nutritional deficiency. Recent Labs    02/19/23 1555 02/19/23 2247 02/20/23 0420 02/21/23 0054 02/21/23 1059 02/22/23 0322 02/23/23 0311 02/24/23 0347 02/25/23 0016 02/26/23 0347  HGB 8.1* 7.3* 7.3* 7.5* 8.1* 8.0* 8.0* 7.6* 7.6* 7.9*  -Appreciate GI recs: Continue Protonix twice daily, Carafate 4 times daily for 2 weeks... -Anticoagulation discontinued per GI recommendation due to risk of bleeding -Monitor H&H.     History of PE/DVT/SMV thrombosis: had TIPS, aspiration of portal vein thrombus, SMV balloon angioplasty along with embolization of left gastric vein by IR on 1/15 and discharged home on Lovenox at least for 1 month followed by Eliquis.   MRI abdomen with occluded TIPS, portal vein and SMV.  Underwent  TIPS thrombectomy, conical aspiration and thrombectomy of of portal, SM, IM and splenic veins and balloon angioplasty of SM and splenic vein on 1/29.  Anticoagulation resumed with IV heparin afterward.  Repeat CT angio on 1/31 with occluded shunts, pulm portal vein and SMV. Repeat thrombectomy  felt to be futile.  IR nothing further to offer. -Palliative medicine consulted.  Acute respiratory failure with hypoxia/possible aspiration pneumonia: CXR 1/24 showed multifocal pneumonia.  No shortness of breath but frequent cough.  She also reports postnasal drainage. -Continue IV Zosyn for a total of 5 days. -Mucolytic's and antitussive -Continue Claritin. -Encourage incentive telemetry   Malignant neoplasm of pancreas: s/p chemoradiation.  Followed by [name].  Currently being monitored off chemo for the last 3 months. She also went to [facility] for a Whipple's procedure but it could not be done due to the location of her tumor. Unfortunately CA 19.9 is now trending up and currently quite high.  MRI abdomen with stable pancreatic head mass.  Case was recently discussed during last hospitalization by hospitalist with [name] oncologist on 02/09/2023, also discussed her case  with her primary oncologist [name] on 02/10/2023: Outpatient follow-up with oncology was advised at that time. -Outpatient follow-up with oncology. -Consulted palliative medicine   Anxiety/depression: Stable -Continue bupropion, buspirone, escitalopram and mirtazapine   Leukopenia: Unclear cause of this.  Resolved. -Continue monitoring   Hypokalemia: Likely due to diarrhea. -Monitor replenish as appropriate  Non-anion gap metabolic acidosis: Mild -Continue monitoring   Thrombocytopenia: Resolved  Disposition: Patient lives alone.  Very anxious to go home. -TOC working on SNF placement -Palliative medicine consulted as well   Body mass index is 25.91 kg/m.           DVT prophylaxis:  SCDs Start: 02/17/23 2241  Code Status: Full code Family Communication: None at bedside. Level of care: Med-Surg Status is: Inpatient Remains inpatient appropriate because: Lack of safe disposition.   Final disposition: Home Consultants:  Gastroenterology Interventional radiology Palliative medicine  35 minutes with more than 50% spent in reviewing records, counseling patient/family and coordinating care.   Sch Meds:  Scheduled Meds:  buPROPion  300 mg Oral QPC breakfast   busPIRone  20 mg Oral BID   Chlorhexidine Gluconate Cloth  6 each Topical Daily   cholecalciferol  10,000 Units Oral q morning   escitalopram  20 mg Oral QHS   guaiFENesin  600 mg Oral BID   loratadine  10 mg Oral Daily   mirtazapine  15  mg Oral QHS   pantoprazole (PROTONIX) IV  40 mg Intravenous Q12H   QUEtiapine  25 mg Oral BID   sodium chloride flush  10-40 mL Intracatheter Q12H   sodium chloride flush  3 mL Intravenous Q12H   sucralfate  1 g Oral TID WC & HS   Continuous Infusions:   PRN Meds:.acetaminophen **OR** acetaminophen, calcium carbonate, guaiFENesin-dextromethorphan, iohexol, LORazepam, ondansetron (ZOFRAN) IV, polyethylene glycol, simethicone, sodium chloride  flush  Antimicrobials: Anti-infectives (From admission, onward)    Start     Dose/Rate Route Frequency Ordered Stop   02/23/23 0000  cefTRIAXone (ROCEPHIN) 2 g in sodium chloride 0.9 % 100 mL IVPB        2 g 200 mL/hr over 30 Minutes Intravenous To Radiology 02/22/23 1244 02/24/23 0000   02/19/23 0200  piperacillin-tazobactam (ZOSYN) IVPB 3.375 g  Status:  Discontinued        3.375 g 12.5 mL/hr over 240 Minutes Intravenous Every 8 hours 02/19/23 0037 02/24/23 1459        I have personally reviewed the following labs and images: CBC: Recent Labs  Lab 02/20/23 0420 02/21/23 0054 02/21/23 1059 02/22/23 0322 02/23/23 0311 02/24/23 0347 02/25/23 0016 02/26/23 0347  WBC 7.1 5.4  --  3.8* 2.9* 3.8* 4.5 4.5  NEUTROABS 6.2 4.4  --  2.8 1.9  --   --   --   HGB 7.3* 7.5*   < > 8.0* 8.0* 7.6* 7.6* 7.9*  HCT 23.3* 23.6*   < > 24.7* 25.1* 24.2* 23.7* 25.1*  MCV 92.8 92.2  --  91.5 91.6 91.7 91.5 91.9  PLT 150 175  --  190 192 175 186 227   < > = values in this interval not displayed.   BMP &GFR Recent Labs  Lab 02/20/23 0420 02/21/23 0054 02/22/23 0322 02/23/23 0311 02/25/23 0016  NA 142 136 140 139 135  K 3.5 3.3* 3.2* 3.9 3.0*  CL 113* 111 113* 112* 111  CO2 21* 20* 21* 19* 20*  GLUCOSE 99 117* 124* 113* 152*  BUN 10 6* 6* 6* 10  CREATININE 0.88 0.92 0.83 0.87 0.86  CALCIUM 7.7* 7.6* 8.1* 8.2* 7.7*  MG 2.3 2.1 1.9 1.9 1.8   Estimated Creatinine Clearance: 47.6 mL/min (by C-G formula based on SCr of 0.86 mg/dL). Liver & Pancreas: Recent Labs  Lab 02/23/23 0311 02/25/23 0016  AST 20 21  ALT 21 20  ALKPHOS 83 75  BILITOT 0.4 0.3  PROT 4.8* 4.4*  ALBUMIN 2.0* 1.9*   No results for input(s): "LIPASE", "AMYLASE" in the last 168 hours. No results for input(s): "AMMONIA" in the last 168 hours. Diabetic: No results for input(s): "HGBA1C" in the last 72 hours. No results for input(s): "GLUCAP" in the last 168 hours. Cardiac Enzymes: No results for input(s):  "CKTOTAL", "CKMB", "CKMBINDEX", "TROPONINI" in the last 168 hours. No results for input(s): "PROBNP" in the last 8760 hours. Coagulation Profile: Recent Labs  Lab 02/23/23 0311  INR 1.1   Thyroid Function Tests: No results for input(s): "TSH", "T4TOTAL", "FREET4", "T3FREE", "THYROIDAB" in the last 72 hours. Lipid Profile: No results for input(s): "CHOL", "HDL", "LDLCALC", "TRIG", "CHOLHDL", "LDLDIRECT" in the last 72 hours. Anemia Panel: Recent Labs    02/24/23 0345 02/24/23 1530  VITAMINB12  --  1,326*  FOLATE  --  19.4  FERRITIN  --  112  TIBC  --  210*  IRON  --  22*  RETICCTPCT 1.5  --    Urine analysis:  Component Value Date/Time   COLORURINE YELLOW 05/06/2022 1225   APPEARANCEUR CLEAR 05/06/2022 1225   LABSPEC 1.017 05/06/2022 1225   PHURINE 5.0 05/06/2022 1225   GLUCOSEU NEGATIVE 05/06/2022 1225   HGBUR NEGATIVE 05/06/2022 1225   BILIRUBINUR NEGATIVE 05/06/2022 1225   KETONESUR NEGATIVE 05/06/2022 1225   PROTEINUR NEGATIVE 05/06/2022 1225   NITRITE NEGATIVE 05/06/2022 1225   LEUKOCYTESUR NEGATIVE 05/06/2022 1225   Sepsis Labs: Invalid input(s): "PROCALCITONIN", "LACTICIDVEN"  Microbiology: No results found for this or any previous visit (from the past 240 hours).  Radiology Studies: No results found.     Alexea Blase T. Traver Meckes [facility]  If 7PM-7AM, please contact night-coverage [email or web address] 02/26/2023, 1:21 PM

## 2023-02-27 DIAGNOSIS — K55069 Acute infarction of intestine, part and extent unspecified: Secondary | ICD-10-CM | POA: Diagnosis not present

## 2023-02-27 DIAGNOSIS — Z66 Do not resuscitate: Secondary | ICD-10-CM

## 2023-02-27 DIAGNOSIS — Z515 Encounter for palliative care: Secondary | ICD-10-CM

## 2023-02-27 DIAGNOSIS — Z7189 Other specified counseling: Secondary | ICD-10-CM | POA: Diagnosis not present

## 2023-02-27 DIAGNOSIS — Z7901 Long term (current) use of anticoagulants: Secondary | ICD-10-CM | POA: Diagnosis not present

## 2023-02-27 DIAGNOSIS — K625 Hemorrhage of anus and rectum: Secondary | ICD-10-CM | POA: Diagnosis not present

## 2023-02-27 DIAGNOSIS — C250 Malignant neoplasm of head of pancreas: Secondary | ICD-10-CM | POA: Diagnosis not present

## 2023-02-27 LAB — CBC
HCT: 22.7 % — ABNORMAL LOW (ref 36.0–46.0)
Hemoglobin: 7.1 g/dL — ABNORMAL LOW (ref 12.0–15.0)
MCH: 29 pg (ref 26.0–34.0)
MCHC: 31.3 g/dL (ref 30.0–36.0)
MCV: 92.7 fL (ref 80.0–100.0)
Platelets: 194 10*3/uL (ref 150–400)
RBC: 2.45 MIL/uL — ABNORMAL LOW (ref 3.87–5.11)
RDW: 18.4 % — ABNORMAL HIGH (ref 11.5–15.5)
WBC: 3.2 10*3/uL — ABNORMAL LOW (ref 4.0–10.5)
nRBC: 0 % (ref 0.0–0.2)

## 2023-02-27 LAB — RENAL FUNCTION PANEL
Albumin: 1.9 g/dL — ABNORMAL LOW (ref 3.5–5.0)
Anion gap: 5 (ref 5–15)
BUN: 9 mg/dL (ref 8–23)
CO2: 22 mmol/L (ref 22–32)
Calcium: 8.3 mg/dL — ABNORMAL LOW (ref 8.9–10.3)
Chloride: 114 mmol/L — ABNORMAL HIGH (ref 98–111)
Creatinine, Ser: 0.89 mg/dL (ref 0.44–1.00)
GFR, Estimated: >60 mL/min (ref >60)
Glucose, Bld: 108 mg/dL — ABNORMAL HIGH (ref 70–99)
Phosphorus: 2.3 mg/dL — ABNORMAL LOW (ref 2.5–4.6)
Potassium: 3.9 mmol/L (ref 3.5–5.1)
Sodium: 141 mmol/L (ref 135–145)

## 2023-02-27 LAB — LIPASE, BLOOD: Lipase: 16 U/L (ref 11–51)

## 2023-02-27 LAB — MAGNESIUM: Magnesium: 1.9 mg/dL (ref 1.7–2.4)

## 2023-02-27 NOTE — Consult Note (Cosign Needed Addendum)
Palliative Care Consult Note                                  Date: 02/27/2023   Patient Name: Sheryl Bender  DOB: 06-04-1945  MRN: [id number]  Age / Sex: 78 y.o., female  PCP: [name], DO Referring Physician: Kathlen Mody, MD  Reason for Consultation: Establishing goals of care  HPI/Patient Profile: 78 y.o. 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 She was admitted on 02/17/2023 with recurrent GI bleeding, history of PE/DVT/SMV chondrosis status post TIPS and multiple thrombectomies and balloon angioplasties, malignant neoplasm of pancreas, and others.   On admission found stable pancreatic head mass, occluded TIPS, portal vein, central SMV.  EGD 1/24 showed multiple gastric ulcers with adherent clot status post endoscopic treatment.  IR had TIPS revision with aspiration thrombectomy and balloon angioplasty on 1/29.  Resumed with anticoagulation post procedure.  2 days later on 1/31 CT found re-occluded shunts, portal vein, SMV and repeat thrombectomy was felt to be futile and IR noted nothing further to offer.  GI recommended discontinuing anticoagulation and palliative consult.  Palliative medicine was consulted for GOC conversations.  Past Medical History:  Diagnosis Date  . Anxiety   . Arthritis   . Depression   . Dysrhythmia    hx palpitations greater than 5 yrs -neg echo, stress per pt ? where or dr  . GERD (gastroesophageal reflux disease)    occ  . Nausea & vomiting 08/24/2021  . Pancreatic adenocarcinoma (HCC)   . Pancreatic pseudocyst   . Port-A-Cath in place 09/15/2021  . Pulmonary embolism [facility])    September 2022    Subjective:   This NP Wynne Dust  reviewed medical records, received report from team, assessed the patient and then meet at the patient's bedside to discuss diagnosis, prognosis, GOC, EOL wishes disposition and options.  I met with the patient at the bedside, no family was present.   We meet to discuss diagnosis prognosis, GOC, EOL wishes, disposition and options. Concept of Palliative Care was introduced as specialized medical care for people and their families living with serious illness.  If focuses on providing relief from the symptoms and stress of a serious illness.  The goal is to improve quality of life for both the patient and the family. Values and goals of care important to patient and family were attempted to be elicited.  Created space and opportunity for patient  and family to explore thoughts and feelings regarding current medical situation   Natural trajectory and current clinical status were discussed. Questions and concerns addressed. Patient  encouraged to call with questions or concerns.    Patient/Family Understanding of Illness: She understands that she was released from the hospital her last admission and 4 days later she was back with recurrent GI bleeding.  She states that they went in and remove the clots that reoccluded her TIPS and other veins.  By Friday they noted there was more clots and they told her there is nothing else they could do for this.  We had extensive discussion about her current clinical situation.  She understands that without ability to prevent rethrombosis that she will likely have recurrent GI  bleeding.  Life Review: The patient has 2 sons. She was a widow and her husband passed in 2008.   Patient Values: Family, faith  Goals: Peaceful passing when it is her time, focus on quality of life over quantity of life.  She wants to leave the Legacy and have some time to spend with her grandchildren and, above all, desires to be pain-free as her end approaches.  Today's Discussion: In  addition to discussion described above we had extensive discussion on various topics.  We talked about what her past moving forward would be if she continued aggressive medical treatment.  This would likely include recurrent hospitalizations, transfusions.  Possibility that there would be no further endoscopy because it has been not effective thus far.  Definitely no repeat IR procedures.  She would likely end up coming back and forth to the hospital getting transfusions until she developed some of the antibodies that this was impossible to continue.  She was clear that this is not what she would want for her care.  She noted that her son and herself were considering possible SNF/rehab admission.  However, her son would prefer for her to go home with home health and hired care to assist her.  We discussed that continuing home health with curative intent or rehab admission would include repeat hospitalizations, which she has told me she does not want.  We discussed alternatives and I brought up hospice care. I described hospice as a service for patients who have a life expectancy of 6 months or less. The goal of hospice is the preservation of dignity and quality at the end phases of life. Under hospice care, the focus changes from curative to symptom relief. I explained the three setting where hospice services can be provided including the home, at a living facility (such as LTC SNF, Assisted Living, etc), and a hospice facility. I explained that acceptance to hospice in any specific location is the final decision of the hospice medical director and bed availability, if applicable. They verbalized understanding.  She seemed very interested in this and feels that aligns with what she wants for her life.  I offered and she agreed to referral for hospice evaluation.  I told her they could likely see her tomorrow which she is agreeable to.  She did note during my discussion that she knows that her time is coming and  she is ready to go home and be with God.  I shared that I would be offer the next couple days but would ask a colleague to check in sometime tomorrow or Tuesday at the latest to follow-up on hospice liaison discussions and help support her decision and thoughts/conversation to decide how she wants her care to proceed.  I offered to reach out to her son and she declined noting that would be difficult to get a hold of until tomorrow morning.  I provided emotional and general support through therapeutic listening, empathy, sharing of stories, therapeutic touch, and other techniques. I answered all questions and addressed all concerns to the best of my ability.  Review of Systems  Constitutional:        Denies pain in general  Respiratory:  Positive for cough. Negative for shortness of breath.   Gastrointestinal:  Negative for abdominal pain, nausea and vomiting.       Admits to bloating    Objective:   Primary Diagnoses: Present on Admission: . GI bleed . Superior mesenteric vein thrombosis (HCC) . Malignant neoplasm of pancreas (  HCC) . BRBPR (bright red blood per rectum)   Physical Exam Vitals and nursing note reviewed.  Constitutional:      General: She is not in acute distress.    Appearance: She is ill-appearing.  HENT:     Head: Normocephalic and atraumatic.  Cardiovascular:     Rate and Rhythm: Normal rate.  Pulmonary:     Effort: Pulmonary effort is normal. No respiratory distress.     Comments: Noted cough during my exam Abdominal:     General: Abdomen is flat.  Skin:    General: Skin is warm and dry.  Neurological:     General: No focal deficit present.     Mental Status: She is alert and oriented to person, place, and time.  Psychiatric:        Mood and Affect: Mood normal.        Behavior: Behavior normal.     Vital Signs:  BP 134/76 (BP Location: Right Arm)   Pulse 98   Temp 98 F (36.7 C)   Resp 18   Ht 5' 2.5" (1.588 m)   Wt 66.6 kg   SpO2 94%    BMI 26.42 kg/m   Palliative Assessment/Data: 60-70%    Advanced Care Planning:   Existing Vynca/ACP Documentation: None  Primary Decision Maker: PATIENT  Code Status/Advance Care Planning: DNR-limited  A discussion was had today regarding advanced directives. Concepts specific to code status, artifical feeding and hydration, continued IV antibiotics and rehospitalization was had.  The difference between a aggressive medical intervention path and a palliative comfort care path for this patient at this time was had.   Decisions/Changes to ACP: None today  Assessment & Plan:   Impression: 78 year old female with acute presentation chronic comorbidities as described above.  She understands she is in a difficult situation where her bleeding is recurrent and caused by thrombosis, thrombosis cannot be stopped.  She desires to not come back and forth to the hospital.  We entertained discussion on hospice care and she is agreeable to meet with the liaison.  I will reach out with TOC to request liaison visit for call tomorrow.  Palliative medicine will continue to support these ongoing discussions.  Overall prognosis poor  SUMMARY OF RECOMMENDATIONS   DNR-limited Continue current scope of care TOC consult for referral to home hospice in the [place], [place] area Palliative medicine will continue to support ongoing discussions and decision on disposition  Symptom Management:  Per primary team PMT is available to support as needed  Prognosis:  < 6 months  Discharge Planning:  To Be Determined   Discussed with: Patient, medical team, nursing team, [facility] team    Thank you for allowing [place] to participate in the care of Elianie Hubers PMT will continue to support holistically.  Time Total: 77 min  Detailed review of medical records (labs, imaging, vital signs), medically appropriate exam, discussed with treatment team, counseling and education to patient, family, & staff,  documenting clinical information, medication management, coordination of care  Signed by: Wynne Dust, NP Palliative Medicine Team  Team Phone # [phone] (Nights/Weekends)  02/27/2023, 3:26 PM

## 2023-02-27 NOTE — Progress Notes (Signed)
PROGRESS NOTE  Sheryl Bender AVW:[id number] DOB: 09-10-1945   PCP: [name], DO  Patient is from: Home.  Independently ambulates at baseline.  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   Patient was hospitalized 1/12-1/19 GI bleed and SMV vein thrombosis.  She underwent TIPS procedure, aspiration of portal vein thrombus and SMV balloon angioplasty along with embolization of left gastric vein by IR on 1/15 and discharged on Lovenox for 1 months with a plan to transition to Eliquis afterward.  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  She was discharged on PPI and Carafate.  Anticoagulation held.  GI and IR consulted this hospitalization.  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  EGD on 1/24 showed multiple gastric ulcers, 2 with adherent clots that were treated endoscopically, gastric antral vascular ectasia, erythematous duodenopathy and moderate hiatal hernia.  GI recommended holding anticoagulation.  IR consulted and patient had TIPS revision with aspiration thrombectomy and balloon angioplasty on 1/29.  Anticoagulation resumed with IV heparin. Repeat CT angio on 1/31 with occluded shunts, pulm portal vein and SMV.  Repeat thrombectomy is felt to  be futile.  IR nothing further to offer.  GI recommended discontinuing anticoagulation and palliative consult.  Palliative consulted.    Subjective:  She is in good spirits. No new complaints, other than bloating. Reports walking int he hallway helped with gas.  Objective: Vitals:   02/27/23 0502 02/27/23 0503 02/27/23 0507 02/27/23 0801  BP: 117/60   134/76  Pulse: 86   98  Resp: 18   18  Temp:  98 F (36.7 C)  98 F (36.7 C)  TempSrc:  Oral    SpO2: 95%   94%  Weight:   66.6 kg   Height:        Examination:  General exam: Appears calm and comfortable  Respiratory system: Clear to auscultation. Respiratory effort normal. Cardiovascular system: S1 & S2 heard, RRR. No JVD, Gastrointestinal system: Abdomen is nondistended, soft and nontender.  Central nervous system: Alert and oriented. No focal neurological deficits. Extremities: Symmetric 5 x 5 power. Skin: No rashes,  Psychiatry: Mood & affect appropriate.    Procedures:  1/24-EGD showed multiple gastric ulcers, 2 with adherent clots that were treated endoscopically, gastric antral vascular ectasia, erythematous duodenopathy and moderate hiatal hernia.  Microbiology summarized: None  Assessment and plan: Recurrent GI bleeding: S/p EGD on 1/24 showing multiple gastric ulcers which were treated endoscopically, gastric antral vascular ectasia, erythematous duodenopathy and moderate hiatal hernia. Hemoglobin relatively stable.  Anemia panel without significant nutritional deficiency. Recommend to continue Protonix twice daily and Carafate 4 times daily for 2 weeks.  Continue Protonix twice daily, Carafate 4 times daily for 2 weeks... Outpatient follow-up with gastroenterology. Gastroenterology recommends stopping all anticoagulation at this time.  History of PE/DVT/SMV thrombosis: S/p TIPS, aspiration  of [place] thrombus, SMA balloon angioplasty along with embolization of left gastric vein by IR on 1/15 and discharged  home on Lovenox for at least 1 month followed by Eliquis.   MRI abdomen with occluded TIPS, portal vein and SMV.  Underwent  TIPS thrombectomy, conical aspiration and thrombectomy of of portal, SM, IM and splenic veins and balloon angioplasty of SM and splenic vein on 1/29.  Anticoagulation resumed with IV heparin afterward.  Repeat CT angio on 1/31 with occluded shunts, pulm portal vein and SMV. Repeat thrombectomy  felt to be futile.  IR nothing further to offer. -Palliative medicine consulted.  Acute respiratory failure with hypoxia/possible aspiration pneumonia:  CXR 1/24 showed multifocal pneumonia.  No shortness of breath but frequent cough.  She also reports postnasal drainage. Completed the course of antibiotics . She remains on RA.    Malignant neoplasm of pancreas:  S/p chemotherapy. Follows up with Dr [name]. She also went to [facility] for a Whipple's procedure but it could not be done due to the location of her tumor. Unfortunately CA 19.9 is now trending up and currently quite high.  MRI abdomen with stable pancreatic head mass.  -she is requesting to transfer to a hospice facility.  -Outpatient follow-up with oncology. -Consulted palliative medicine   Anxiety/depression: Stable -Continue bupropion, buspirone, escitalopram and mirtazapine   Leukopenia: Unclear cause of this.  Resolved. -Continue monitoring   Hypokalemia: replaced.   Non-anion gap metabolic acidosis: Mild -Continue monitoring   Thrombocytopenia: Resolved  Disposition: Patient lives alone.   -TOC working on SNF placement, but she would like to go home with hospice.  -Palliative medicine consulted as well   Body mass index is 26.42 kg/m.           DVT prophylaxis:  SCDs Start: 02/17/23 2241  Code Status: Full code Family Communication: None at bedside. Level of care: Med-Surg Status is: Inpatient Remains inpatient appropriate because: Lack of safe disposition.   Final disposition:  Home Consultants:  Gastroenterology Interventional radiology Palliative medicine  35 minutes with more than 50% spent in reviewing records, counseling patient/family and coordinating care.   Sch Meds:  Scheduled Meds:  buPROPion  300 mg Oral QPC breakfast   busPIRone  20 mg Oral BID   Chlorhexidine Gluconate Cloth  6 each Topical Daily   cholecalciferol  10,000 Units Oral q morning   escitalopram  20 mg Oral QHS   guaiFENesin  600 mg Oral BID   loratadine  10 mg Oral Daily   mirtazapine  15 mg Oral QHS   pantoprazole (PROTONIX) IV  40 mg Intravenous Q12H   QUEtiapine  25 mg Oral BID   sodium chloride flush  10-40 mL Intracatheter Q12H   sodium chloride flush  3 mL Intravenous Q12H   sucralfate  1 g Oral TID WC & HS   Continuous Infusions:   PRN Meds:.acetaminophen **OR** acetaminophen, guaiFENesin-dextromethorphan, iohexol, LORazepam, ondansetron (ZOFRAN) IV, polyethylene glycol, simethicone, sodium chloride flush  Antimicrobials: Anti-infectives (From admission, onward)    Start     Dose/Rate Route Frequency Ordered Stop   02/23/23 0000  cefTRIAXone (ROCEPHIN) 2 g in sodium chloride 0.9 % 100 mL IVPB        2 g 200 mL/hr over 30 Minutes Intravenous To Radiology 02/22/23 1244 02/24/23 0000   02/19/23 0200  piperacillin-tazobactam (ZOSYN) IVPB 3.375 g  Status:  Discontinued        3.375 g 12.5 mL/hr over 240 Minutes Intravenous Every 8 hours 02/19/23 0037 02/24/23 1459  I have personally reviewed the following labs and images: CBC: Recent Labs  Lab 02/21/23 0054 02/21/23 1059 02/22/23 0322 02/23/23 0311 02/24/23 0347 02/25/23 0016 02/26/23 0347 02/27/23 0408  WBC 5.4  --  3.8* 2.9* 3.8* 4.5 4.5 3.2*  NEUTROABS 4.4  --  2.8 1.9  --   --   --   --   HGB 7.5*   < > 8.0* 8.0* 7.6* 7.6* 7.9* 7.1*  HCT 23.6*   < > 24.7* 25.1* 24.2* 23.7* 25.1* 22.7*  MCV 92.2  --  91.5 91.6 91.7 91.5 91.9 92.7  PLT 175  --  190 192 175 186 227 194   < > = values in this  interval not displayed.   BMP &GFR Recent Labs  Lab 02/21/23 0054 02/22/23 0322 02/23/23 0311 02/25/23 0016 02/27/23 0408  NA 136 140 139 135 141  K 3.3* 3.2* 3.9 3.0* 3.9  CL 111 113* 112* 111 114*  CO2 20* 21* 19* 20* 22  GLUCOSE 117* 124* 113* 152* 108*  BUN 6* 6* 6* 10 9  CREATININE 0.92 0.83 0.87 0.86 0.89  CALCIUM 7.6* 8.1* 8.2* 7.7* 8.3*  MG 2.1 1.9 1.9 1.8 1.9  PHOS  --   --   --   --  2.3*   Estimated Creatinine Clearance: 46.4 mL/min (by C-G formula based on SCr of 0.89 mg/dL). Liver & Pancreas: Recent Labs  Lab 02/23/23 0311 02/25/23 0016 02/27/23 0408  AST 20 21  --   ALT 21 20  --   ALKPHOS 83 75  --   BILITOT 0.4 0.3  --   PROT 4.8* 4.4*  --   ALBUMIN 2.0* 1.9* 1.9*   Recent Labs  Lab 02/27/23 0408  LIPASE 16   No results for input(s): "AMMONIA" in the last 168 hours. Diabetic: No results for input(s): "HGBA1C" in the last 72 hours. No results for input(s): "GLUCAP" in the last 168 hours. Cardiac Enzymes: No results for input(s): "CKTOTAL", "CKMB", "CKMBINDEX", "TROPONINI" in the last 168 hours. No results for input(s): "PROBNP" in the last 8760 hours. Coagulation Profile: Recent Labs  Lab 02/23/23 0311  INR 1.1   Thyroid Function Tests: No results for input(s): "TSH", "T4TOTAL", "FREET4", "T3FREE", "THYROIDAB" in the last 72 hours. Lipid Profile: No results for input(s): "CHOL", "HDL", "LDLCALC", "TRIG", "CHOLHDL", "LDLDIRECT" in the last 72 hours. Anemia Panel: Recent Labs    02/24/23 1530  VITAMINB12 1,326*  FOLATE 19.4  FERRITIN 112  TIBC 210*  IRON 22*   Urine analysis:    Component Value Date/Time   COLORURINE YELLOW 05/06/2022 1225   APPEARANCEUR CLEAR 05/06/2022 1225   LABSPEC 1.017 05/06/2022 1225   PHURINE 5.0 05/06/2022 1225   GLUCOSEU NEGATIVE 05/06/2022 1225   HGBUR NEGATIVE 05/06/2022 1225   BILIRUBINUR NEGATIVE 05/06/2022 1225   KETONESUR NEGATIVE 05/06/2022 1225   PROTEINUR NEGATIVE 05/06/2022 1225   NITRITE  NEGATIVE 05/06/2022 1225   LEUKOCYTESUR NEGATIVE 05/06/2022 1225   Sepsis Labs: Invalid input(s): "PROCALCITONIN", "LACTICIDVEN"  Microbiology: No results found for this or any previous visit (from the past 240 hours).  Radiology Studies: No results found.     Kathlen Mody, MD  If 7PM-7AM, please contact night-coverage [email or web address] 02/27/2023, 1:22 PM

## 2023-02-27 NOTE — Plan of Care (Signed)
  Problem: Education: Goal: Knowledge of General Education information will improve Description: Including pain rating scale, medication(s)/side effects and non-pharmacologic comfort measures Outcome: Progressing   Problem: Health Behavior/Discharge Planning: Goal: Ability to manage health-related needs will improve Outcome: Progressing   Problem: Clinical Measurements: Goal: Will remain free from infection Outcome: Progressing Goal: Respiratory complications will improve Outcome: Progressing   Problem: Activity: Goal: Risk for activity intolerance will decrease Outcome: Progressing   Problem: Coping: Goal: Level of anxiety will decrease Outcome: Progressing   Problem: Elimination: Goal: Will not experience complications related to urinary retention Outcome: Progressing   Problem: Safety: Goal: Ability to remain free from injury will improve Outcome: Progressing   Problem: Skin Integrity: Goal: Risk for impaired skin integrity will decrease Outcome: Progressing

## 2023-02-27 NOTE — Plan of Care (Signed)

## 2023-02-28 DIAGNOSIS — K31819 Angiodysplasia of stomach and duodenum without bleeding: Secondary | ICD-10-CM | POA: Diagnosis not present

## 2023-02-28 DIAGNOSIS — Z515 Encounter for palliative care: Secondary | ICD-10-CM | POA: Diagnosis not present

## 2023-02-28 DIAGNOSIS — Z7189 Other specified counseling: Secondary | ICD-10-CM | POA: Diagnosis not present

## 2023-02-28 DIAGNOSIS — D62 Acute posthemorrhagic anemia: Secondary | ICD-10-CM | POA: Diagnosis not present

## 2023-02-28 DIAGNOSIS — C250 Malignant neoplasm of head of pancreas: Secondary | ICD-10-CM | POA: Diagnosis not present

## 2023-02-28 DIAGNOSIS — K55069 Acute infarction of intestine, part and extent unspecified: Secondary | ICD-10-CM | POA: Diagnosis not present

## 2023-02-28 MED ORDER — ESCITALOPRAM OXALATE 10 MG PO TABS
20.0000 mg | ORAL_TABLET | Freq: Every day | ORAL | Status: DC
  Administered 2023-02-28: 20 mg via ORAL
  Filled 2023-02-28: qty 2

## 2023-02-28 MED ORDER — BUSPIRONE HCL 10 MG PO TABS
20.0000 mg | ORAL_TABLET | Freq: Two times a day (BID) | ORAL | Status: DC
  Administered 2023-02-28 – 2023-03-01 (×2): 20 mg via ORAL
  Filled 2023-02-28 (×2): qty 2

## 2023-02-28 MED ORDER — LINACLOTIDE 145 MCG PO CAPS
145.0000 ug | ORAL_CAPSULE | Freq: Every day | ORAL | Status: DC
  Administered 2023-02-28 – 2023-03-01 (×2): 145 ug via ORAL
  Filled 2023-02-28 (×2): qty 1

## 2023-02-28 MED ORDER — GUAIFENESIN ER 600 MG PO TB12
600.0000 mg | ORAL_TABLET | Freq: Two times a day (BID) | ORAL | Status: DC
Start: 2023-02-28 — End: 2023-03-01
  Administered 2023-02-28 – 2023-03-01 (×2): 600 mg via ORAL
  Filled 2023-02-28 (×2): qty 1

## 2023-02-28 MED ORDER — MIRTAZAPINE 15 MG PO TABS
15.0000 mg | ORAL_TABLET | Freq: Every day | ORAL | Status: DC
  Administered 2023-02-28: 15 mg via ORAL
  Filled 2023-02-28: qty 1

## 2023-02-28 MED ORDER — QUETIAPINE FUMARATE 25 MG PO TABS
25.0000 mg | ORAL_TABLET | Freq: Two times a day (BID) | ORAL | Status: DC
  Administered 2023-02-28 – 2023-03-01 (×2): 25 mg via ORAL
  Filled 2023-02-28 (×2): qty 1

## 2023-02-28 MED ORDER — BENZONATATE 100 MG PO CAPS: 100.0000 mg | ORAL_CAPSULE | Freq: Three times a day (TID) | ORAL | Status: DC | PRN

## 2023-02-28 NOTE — Progress Notes (Signed)
Mobility Specialist: Progress Note   02/28/23 1512  Mobility  Activity Ambulated with assistance in hallway  Level of Assistance Standby assist, set-up cues, supervision of patient - no hands on  Assistive Device None  Distance Ambulated (ft) 300 ft  Activity Response Tolerated well  Mobility Referral Yes  Mobility visit 1 Mobility  Mobility Specialist Start Time (ACUTE ONLY) 1435  Mobility Specialist Stop Time (ACUTE ONLY) 1445  Mobility Specialist Time Calculation (min) (ACUTE ONLY) 10 min    Pt was agreeable to mobility session - received in bed. SV throughout. Slight unsteadiness but moving much better than this morning and no overt LOB. Ambulated twice around loop in the hallway. Returned to room without fault. Left in bed with all needs met, call bell in reach. Bed alarm on.   Maurene Capes Mobility Specialist Please contact via SecureChat or Rehab office at [phone]

## 2023-02-28 NOTE — Progress Notes (Signed)
PROGRESS NOTE  Sheryl Bender OZD:[id number] DOB: 01-Apr-1945   PCP: [name], DO  Patient is from: Home.  Independently ambulates at baseline.  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   Patient was hospitalized 1/12-1/19 GI bleed and SMV vein thrombosis.  She underwent TIPS procedure, aspiration of portal vein thrombus and SMV balloon angioplasty along with embolization of left gastric vein by IR on 1/15 and discharged on Lovenox for 1 months with a plan to transition to Eliquis afterward.  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  She was discharged on PPI and Carafate.  Anticoagulation held.  GI and IR consulted this hospitalization.  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  EGD on 1/24 showed multiple gastric ulcers, 2 with adherent clots that were treated endoscopically, gastric antral vascular ectasia, erythematous duodenopathy and moderate hiatal hernia.  GI recommended holding anticoagulation.  IR consulted and patient had TIPS revision with aspiration thrombectomy and balloon angioplasty on 1/29.  Anticoagulation resumed with IV heparin. Repeat CT angio on 1/31 with occluded shunts, pulm portal vein and SMV.  Repeat thrombectomy is felt to  be futile.  IR nothing further to offer.  GI recommended discontinuing anticoagulation and palliative consult.  Palliative consulted.  Hospice referral placed.   Subjective: Seen and examined earlier this morning.  No major events overnight of this morning.  She was asking if she could get her night medication a little earlier.  Also reports feeling bloated and gassy.  Objective: Vitals:   02/27/23 2117 02/28/23 0500 02/28/23 0608 02/28/23 0812  BP: 126/66  126/65 122/60  Pulse: (!) 110  85 86  Resp: 18  18 18   Temp: 98 F (36.7 C) 97.9 F (36.6 C)  98.8 F (37.1 C)  TempSrc: Oral Oral  Oral  SpO2: 95%  93% 92%  Weight:  66.7 kg    Height:        Examination:  GENERAL: No apparent distress.  Nontoxic. HEENT: MMM.  Vision and hearing grossly intact.  NECK: Supple.  No apparent JVD.  RESP:  No IWOB.  Fair aeration bilaterally.  CVS:  RRR. Heart sounds normal.  ABD/GI/GU: BS+. Abd soft, NTND.  MSK/EXT:  Moves extremities. No apparent deformity. No edema.  SKIN: no apparent skin lesion or wound NEURO: Awake, alert and oriented appropriately.  No apparent focal neuro deficit. PSYCH: Calm. Normal affect.   Procedures:  1/24-EGD showed multiple gastric ulcers, 2 with adherent clots that were treated endoscopically, gastric antral vascular ectasia, erythematous duodenopathy and moderate hiatal hernia.  Microbiology summarized: None  Assessment and plan: Recurrent GI bleeding: Returns with bright red blood per rectum.  Patient is on Lovenox for the PE/DVT/SMV thrombosis.  Had extensive evaluation including EGD and colo last hospitalization.  Repeat EGD on 1/24 with multiple gastric ulcers (treated endoscopically), gastric  antral vascular ectasia, erythematous duodenopathy and moderate hiatal hernia.  Hgb 9.6 on admission (was 9.2 when discharge on 1/19).  H&H relatively stable now.  Anemia panel without significant nutritional deficiency. Recent Labs    02/19/23 2247  02/20/23 0420 02/21/23 0054 02/21/23 1059 02/22/23 0322 02/23/23 0311 02/24/23 0347 02/25/23 0016 02/26/23 0347 02/27/23 0408  HGB 7.3* 7.3* 7.5* 8.1* 8.0* 8.0* 7.6* 7.6* 7.9* 7.1*  -Palliative consulted per recommendation by GI.  Hospice referral placed. -Anticoagulation discontinued due to risk of bleeding. -Continue Protonix twice daily, Carafate 4 times daily for 2 weeks...   History of PE/DVT/SMV thrombosis: had TIPS, aspiration of portal vein thrombus, SMV balloon angioplasty along with embolization of left gastric vein by IR on 1/15 and discharged home on Lovenox at least for 1 month followed by Eliquis.   MRI abdomen with occluded TIPS, portal vein and SMV.  Underwent  TIPS thrombectomy, conical aspiration and thrombectomy of of portal, SM, IM and splenic veins and balloon angioplasty of SM and splenic vein on 1/29.  Anticoagulation resumed with IV heparin afterward.  Repeat CT angio on 1/31 with occluded shunts, pulm portal vein and SMV. Repeat thrombectomy  felt to be futile.  IR nothing further to offer. -Palliative medicine following.  Hospice referral placed.  Acute respiratory failure with hypoxia/possible aspiration pneumonia: CXR 1/24 showed multifocal pneumonia.  No shortness of breath but frequent cough.  She also reports postnasal drainage. -Completed 5 days of IV Zosyn. -Mucolytic's and antitussive -Continue Claritin. -Encourage incentive telemetry   Malignant neoplasm of pancreas: s/p chemoradiation.  Followed by [name].  Currently being monitored off chemo for the last 3 months. She also went to [facility] for a Whipple's procedure but it could not be done due to the location of her tumor. Unfortunately CA 19.9 is now trending up and currently quite high.  MRI abdomen with stable pancreatic head mass.  Case was recently discussed during last hospitalization by hospitalist with [name] oncologist on 02/09/2023, also discussed her case with her primary oncologist Dr.  [name] on 02/10/2023: Outpatient follow-up with oncology was advised at that time. -Outpatient follow-up with oncology. -Consulted palliative medicine   Anxiety/depression: Stable -Continue bupropion, buspirone, escitalopram and mirtazapine   Leukopenia: Unclear cause of this.  Resolved. -Continue monitoring   Hypokalemia: Likely due to diarrhea. -Monitor replenish as appropriate  Non-anion gap metabolic acidosis: Mild -Continue monitoring   Thrombocytopenia: Resolved  Disposition: Patient lives alone.   -TOC working on SNF placement -Palliative medicine consulted.  Hospice referral requested.   Body mass index is 26.47 kg/m.           DVT prophylaxis:  SCDs Start: 02/17/23 2241  Code Status: DNR/DNI. Family Communication: None at bedside. Level of care: Med-Surg Status is: Inpatient Remains inpatient appropriate because: Lack of safe disposition.   Final disposition: Home Consultants:  Gastroenterology Interventional radiology Palliative medicine  35 minutes with more than 50% spent in reviewing records, counseling patient/family and coordinating care.   Sch Meds:  Scheduled Meds:  buPROPion  300 mg Oral QPC breakfast   busPIRone  20 mg Oral BID   Chlorhexidine Gluconate Cloth  6 each Topical Daily   cholecalciferol  10,000 Units Oral q morning   escitalopram  20 mg Oral QHS   guaiFENesin  600 mg Oral BID   linaclotide  145 mcg Oral QAC breakfast   loratadine  10 mg Oral Daily   mirtazapine  15 mg Oral QHS   pantoprazole (PROTONIX) IV  40 mg  Intravenous Q12H   QUEtiapine  25 mg Oral BID   sodium chloride flush  10-40 mL Intracatheter Q12H   sodium chloride flush  3 mL Intravenous Q12H   sucralfate  1 g Oral TID WC & HS   Continuous Infusions:   PRN Meds:.acetaminophen **OR** acetaminophen, benzonatate, iohexol, LORazepam, ondansetron (ZOFRAN) IV, polyethylene glycol, simethicone, sodium chloride flush  Antimicrobials: Anti-infectives (From  admission, onward)    Start     Dose/Rate Route Frequency Ordered Stop   02/23/23 0000  cefTRIAXone (ROCEPHIN) 2 g in sodium chloride 0.9 % 100 mL IVPB        2 g 200 mL/hr over 30 Minutes Intravenous To Radiology 02/22/23 1244 02/24/23 0000   02/19/23 0200  piperacillin-tazobactam (ZOSYN) IVPB 3.375 g  Status:  Discontinued        3.375 g 12.5 mL/hr over 240 Minutes Intravenous Every 8 hours 02/19/23 0037 02/24/23 1459        I have personally reviewed the following labs and images: CBC: Recent Labs  Lab 02/22/23 0322 02/23/23 0311 02/24/23 0347 02/25/23 0016 02/26/23 0347 02/27/23 0408  WBC 3.8* 2.9* 3.8* 4.5 4.5 3.2*  NEUTROABS 2.8 1.9  --   --   --   --   HGB 8.0* 8.0* 7.6* 7.6* 7.9* 7.1*  HCT 24.7* 25.1* 24.2* 23.7* 25.1* 22.7*  MCV 91.5 91.6 91.7 91.5 91.9 92.7  PLT 190 192 175 186 227 194   BMP &GFR Recent Labs  Lab 02/22/23 0322 02/23/23 0311 02/25/23 0016 02/27/23 0408  NA 140 139 135 141  K 3.2* 3.9 3.0* 3.9  CL 113* 112* 111 114*  CO2 21* 19* 20* 22  GLUCOSE 124* 113* 152* 108*  BUN 6* 6* 10 9  CREATININE 0.83 0.87 0.86 0.89  CALCIUM 8.1* 8.2* 7.7* 8.3*  MG 1.9 1.9 1.8 1.9  PHOS  --   --   --  2.3*   Estimated Creatinine Clearance: 46.5 mL/min (by C-G formula based on SCr of 0.89 mg/dL). Liver & Pancreas: Recent Labs  Lab 02/23/23 0311 02/25/23 0016 02/27/23 0408  AST 20 21  --   ALT 21 20  --   ALKPHOS 83 75  --   BILITOT 0.4 0.3  --   PROT 4.8* 4.4*  --   ALBUMIN 2.0* 1.9* 1.9*   Recent Labs  Lab 02/27/23 0408  LIPASE 16   No results for input(s): "AMMONIA" in the last 168 hours. Diabetic: No results for input(s): "HGBA1C" in the last 72 hours. No results for input(s): "GLUCAP" in the last 168 hours. Cardiac Enzymes: No results for input(s): "CKTOTAL", "CKMB", "CKMBINDEX", "TROPONINI" in the last 168 hours. No results for input(s): "PROBNP" in the last 8760 hours. Coagulation Profile: Recent Labs  Lab 02/23/23 0311  INR 1.1    Thyroid Function Tests: No results for input(s): "TSH", "T4TOTAL", "FREET4", "T3FREE", "THYROIDAB" in the last 72 hours. Lipid Profile: No results for input(s): "CHOL", "HDL", "LDLCALC", "TRIG", "CHOLHDL", "LDLDIRECT" in the last 72 hours. Anemia Panel: No results for input(s): "VITAMINB12", "FOLATE", "FERRITIN", "TIBC", "IRON", "RETICCTPCT" in the last 72 hours.  Urine analysis:    Component Value Date/Time   COLORURINE YELLOW 05/06/2022 1225   APPEARANCEUR CLEAR 05/06/2022 1225   LABSPEC 1.017 05/06/2022 1225   PHURINE 5.0 05/06/2022 1225   GLUCOSEU NEGATIVE 05/06/2022 1225   HGBUR NEGATIVE 05/06/2022 1225   BILIRUBINUR NEGATIVE 05/06/2022 1225   KETONESUR NEGATIVE 05/06/2022 1225   PROTEINUR NEGATIVE 05/06/2022 1225   NITRITE NEGATIVE 05/06/2022 1225  LEUKOCYTESUR NEGATIVE 05/06/2022 1225   Sepsis Labs: Invalid input(s): "PROCALCITONIN", "LACTICIDVEN"  Microbiology: No results found for this or any previous visit (from the past 240 hours).  Radiology Studies: No results found.     Irving Lubbers T. Rilen Shukla [facility]  If 7PM-7AM, please contact night-coverage [email or web address] 02/28/2023, 12:08 PM

## 2023-02-28 NOTE — Plan of Care (Signed)

## 2023-02-28 NOTE — Progress Notes (Signed)
   Palliative Medicine Inpatient Follow Up Note  HPI: 78 y.o. 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 She was admitted on 02/17/2023 with recurrent GI bleeding, history of PE/DVT/SMV chondrosis status post TIPS and multiple thrombectomies and balloon angioplasties, malignant neoplasm of pancreas, and others.    On admission found stable pancreatic head mass, occluded TIPS, portal vein, central SMV.  EGD 1/24 showed multiple gastric ulcers with adherent clot status post endoscopic treatment.  IR had TIPS revision with aspiration thrombectomy and balloon angioplasty on 1/29.  Resumed with anticoagulation post procedure.  2 days later on 1/31 CT found re-occluded shunts, portal vein, SMV and repeat thrombectomy was felt to be futile and IR noted nothing further to offer.  GI recommended discontinuing anticoagulation and palliative consult.   Palliative medicine was consulted for GOC conversations.  Today's Discussion 02/28/2023  *Please note that this is a verbal dictation therefore any spelling or grammatical errors are due to the "Dragon Medical One" system interpretation.  Chart reviewed inclusive of vital signs, progress notes, laboratory results, and diagnostic images.   I met with Francis at bedside this afternoon. We discussed her clinical scenario in the setting of her SMV thrombosis. Created space and opportunity for patient to explore thoughts feelings and fears regarding current medical situation.She shares that she understands her time on earth is limited. 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   We discussed the options moving forward - one being skilled nursing with hospice and  the other being home with hospice. Patients son lives fairly close by, We reviewed caregiver needs. The [facility] team has helped with providing information on cost.  Patient and son have spoke to [facility]. Plan for her to go home with hospice services tomorrow.   Questions and concerns addressed/Palliative Support Provided.   Objective Assessment: Vital Signs Vitals:   02/28/23 0608 02/28/23 0812  BP: 126/65 122/60  Pulse: 85 86  Resp: 18 18  Temp:  98.8 F (37.1 C)  SpO2: 93% 92%   No intake or output data in the 24 hours ending 02/28/23 1428 Last Weight  Most recent update: 02/28/2023  6:08 AM    Weight  66.7 kg (147 lb 0.8 oz)            Gen: Elderly Caucasian F in NAD HEENT: moist mucous membranes CV: Regular rate and rhythm  PULM:ON RA,  clear to auscultation bilaterally. No wheezes/rales/rhonchi  ABD: soft/nontender  EXT: No edema  Neuro: Alert and oriented x3   SUMMARY OF RECOMMENDATIONS   DNAR/DNI  Patient would like to go home with [facility] for transition home tomorrow with hospice services  Billing based on MDM: High ______________________________________________________________________________________ Lamarr Lulas [facility] Palliative Medicine Team Team Cell Phone: [phone] Please utilize secure chat with additional questions, if there is no response within 30 minutes please call the above phone number  Palliative Medicine Team providers are available by phone from 7am to 7pm daily and can be reached through the team cell phone.  Should this patient require assistance outside of these hours, please call the patient's attending physician.

## 2023-02-28 NOTE — TOC Progression Note (Addendum)
Transition of Care [facility]) - Progression Note    Patient Details  Name: Sheryl Bender MRN: [id number] Date of Birth: 1945/08/11  [facility] CM/SW Contact  Janae Bridgeman, RN Phone Number: 02/28/2023, 10:34 AM  Clinical Narrative:    CM spoke with Palliative Care NP this morning and patient needs referral to Home Hospice provider in the [place], [place] area.  Patient was offered Medicare choice regarding home hospice provider and she did not have a preference.    Referral was placed by phone to [facility] provider and clinicals were faxed to the provider to review for home hospice - fax # [phone].  Patient states that she wants to return home to her apartment with home hospice and family support through her son, [name], who lives near by.  DME in the home includes [facility] and [facility].  CM will continue to follow the patient for TOC to return home with home hospice.  02/28/23 1350 - I spoke with Baird Lyons, CM with [facility] view and the patient is set up for home hospice services and the RN will meet with the patient at the home after she is discharged home tomorrow.  MD is aware.        Expected Discharge Plan and Services                                               Social Determinants of Health (SDOH) Interventions SDOH Screenings   Food Insecurity: No Food Insecurity (02/18/2023)  Housing: Low Risk  (02/18/2023)  Transportation Needs: No Transportation Needs (02/18/2023)  Utilities: Not At Risk (02/18/2023)  Social Connections: Moderately Integrated (02/18/2023)  Tobacco Use: Low Risk  (02/18/2023)    Readmission Risk Interventions    02/28/2023   10:34 AM 02/21/2023   12:31 PM 01/31/2023   12:36 PM  Readmission Risk Prevention Plan  Transportation Screening Complete Complete Complete  HRI or Home Care Consult   Complete  Social Work Consult for Recovery Care Planning/Counseling   Complete  Palliative Care Screening   Not  Applicable  Medication Review Oceanographer) Complete Complete Complete  PCP or Specialist appointment within 3-5 days of discharge Complete Complete   HRI or Home Care Consult Complete Complete   SW Recovery Care/Counseling Consult Complete Complete   Palliative Care Screening Complete Complete   Skilled Nursing Facility Not Applicable Patient Refused

## 2023-02-28 NOTE — Progress Notes (Signed)
Mobility Specialist: Progress Note   02/28/23 1025  Mobility  Activity Ambulated with assistance in hallway  Level of Assistance Contact guard assist, steadying assist  Assistive Device None  Distance Ambulated (ft) 300 ft  Activity Response Tolerated well  Mobility Referral Yes  Mobility visit 1 Mobility  Mobility Specialist Start Time (ACUTE ONLY) 0843  Mobility Specialist Stop Time (ACUTE ONLY) 0855  Mobility Specialist Time Calculation (min) (ACUTE ONLY) 12 min    Pt was agreeable to mobility session - received in bed. C/o feeling fatigued and constipated. CG throughout d/t unsteadiness. Returned to room without fault. Left in bed with all needs met, call bell in reach.   Maurene Capes Mobility Specialist Please contact via SecureChat or Rehab office at [phone]

## 2023-03-01 DIAGNOSIS — K921 Melena: Secondary | ICD-10-CM | POA: Diagnosis not present

## 2023-03-01 DIAGNOSIS — Z7189 Other specified counseling: Secondary | ICD-10-CM | POA: Diagnosis not present

## 2023-03-01 DIAGNOSIS — K55069 Acute infarction of intestine, part and extent unspecified: Secondary | ICD-10-CM | POA: Diagnosis not present

## 2023-03-01 DIAGNOSIS — Z515 Encounter for palliative care: Secondary | ICD-10-CM | POA: Diagnosis not present

## 2023-03-01 DIAGNOSIS — C250 Malignant neoplasm of head of pancreas: Secondary | ICD-10-CM | POA: Diagnosis not present

## 2023-03-01 MED ORDER — BENZONATATE 100 MG PO CAPS: 100.0000 mg | ORAL_CAPSULE | Freq: Three times a day (TID) | ORAL | 0 refills | Status: DC | PRN

## 2023-03-01 MED ORDER — SUCRALFATE 1 G PO TABS: 1.0000 g | ORAL_TABLET | Freq: Three times a day (TID) | ORAL | 0 refills | Status: DC

## 2023-03-01 MED ORDER — HEPARIN SOD (PORK) LOCK FLUSH 100 UNIT/ML IV SOLN
500.0000 [IU] | INTRAVENOUS | Status: DC | PRN
  Filled 2023-03-01: qty 5

## 2023-03-01 NOTE — Progress Notes (Signed)
   Palliative Medicine Inpatient Follow Up Note  HPI: 78 y.o. 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 She was admitted on 02/17/2023 with recurrent GI bleeding, history of PE/DVT/SMV chondrosis status post TIPS and multiple thrombectomies and balloon angioplasties, malignant neoplasm of pancreas, and others.    On admission found stable pancreatic head mass, occluded TIPS, portal vein, central SMV.  EGD 1/24 showed multiple gastric ulcers with adherent clot status post endoscopic treatment.  IR had TIPS revision with aspiration thrombectomy and balloon angioplasty on 1/29.  Resumed with anticoagulation post procedure.  2 days later on 1/31 CT found re-occluded shunts, portal vein, SMV and repeat thrombectomy was felt to be futile and IR noted nothing further to offer.  GI recommended discontinuing anticoagulation and palliative consult.   Palliative medicine was consulted for GOC conversations.  Today's Discussion 03/01/2023  *Please note that this is a verbal dictation therefore any spelling or grammatical errors are due to the Dragon Medical One system interpretation.  Chart reviewed inclusive of vital signs, progress notes, laboratory results, and diagnostic images.   I met with Sheryl Bender at bedside this morning though she is resting comfortably. Per her NT she is tired.  Plan for discharge to home today with [name].  I called patients son, Sheryl Bender and discussed with him the plan. He shares that he will come to pick his mother up later on. He states that he is looking into facilities should her condition worsen that could support caring for her. Sheryl Bender notes that he plans to reach out to Sheryl Bender's  grandchildren to come spend time with her once she discharges.   Questions and concerns addressed/Palliative Support Provided.   Objective Assessment: Vital Signs Vitals:   03/01/23 0406 03/01/23 0825  BP: 122/61 117/69  Pulse: 83 87  Resp: 19 15  Temp: 97.9 F (36.6 C)   SpO2: 93% 90%    Intake/Output Summary (Last 24 hours) at 03/01/2023 0945 Last data filed at 03/01/2023 0134 Gross per 24 hour  Intake --  Output 1 ml  Net -1 ml   Last Weight  Most recent update: 03/01/2023  6:35 AM    Weight  66.7 kg (147 lb 0.8 oz)            Gen: Elderly Caucasian F in NAD HEENT: moist mucous membranes CV: Regular rate and rhythm  PULM:ON RA,  clear to auscultation bilaterally. No wheezes/rales/rhonchi  ABD: soft/nontender  EXT: No edema  Neuro: Alert and oriented x3   SUMMARY OF RECOMMENDATIONS   DNAR/DNI  Patient would like to go home with [facility]  Plan for transition home with hospice services  Time: 25 ______________________________________________________________________________________ Sheryl Bender [facility] Palliative Medicine Team Team Cell Phone: [phone] Please utilize secure chat with additional questions, if there is no response within 30 minutes please call the above phone number  Palliative Medicine Team providers are available by phone from 7am to 7pm daily and can be reached through the team cell phone.  Should this patient require assistance outside of these hours, please call the patient's attending physician.

## 2023-03-01 NOTE — Discharge Summary (Signed)
 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    Patient was hospitalized 1/12-1/19 GI bleed and SMV vein thrombosis.  She underwent TIPS procedure, aspiration of portal vein thrombus and SMV balloon angioplasty along with embolization of left gastric vein by IR on 1/15 and discharged on Lovenox  for 1 months with a plan to transition to Eliquis  afterward.  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  She was discharged on PPI and Carafate .   Anticoagulation held.  GI and IR consulted this hospitalization.  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  EGD on 1/24 showed multiple gastric ulcers, 2 with adherent clots that were treated endoscopically, gastric antral vascular ectasia, erythematous duodenopathy and moderate hiatal hernia.  GI recommended holding anticoagulation.  IR consulted and patient had TIPS revision with aspiration thrombectomy and balloon angioplasty on 1/29.  Anticoagulation resumed with IV heparin . Repeat CT angio on 1/31 with occluded shunts, pulm portal vein and SMV.   Repeat thrombectomy is felt to be futile.  IR nothing further to offer.  GI recommended discontinuing anticoagulation and palliative consult.  Palliative consulted.  Patient is discharged home with home hospice.  See individual problem list below for more.   Problems addressed during this hospitalization Recurrent GI bleeding: Returns with bright red blood per rectum.  Patient is on Lovenox  for the PE/DVT/SMV thrombosis.  Had extensive evaluation including EGD and colo last hospitalization.  Repeat EGD on 1/24 with multiple gastric ulcers (treated endoscopically), gastric antral vascular ectasia, erythematous duodenopathy and moderate hiatal hernia.  Hgb 9.6 on admission (was 9.2 when discharge on 1/19).  H&H relatively stable now.  Anemia panel without significant nutritional deficiency. -Palliative consulted per recommendation by GI.  Hospice referral placed. -Anticoagulation discontinued per recommendation by GI due to risk of bleeding. -Continue Protonix  twice daily, Carafate  4 times daily for 2 weeks...   History of PE/DVT/SMV thrombosis: had TIPS, aspiration of portal vein thrombus, SMV balloon angioplasty along with embolization of left gastric vein by IR on 1/15 and discharged home on Lovenox  at least for 1 month followed by Eliquis .   MRI abdomen with occluded TIPS, portal vein and SMV.  Underwent  TIPS thrombectomy, conical aspiration and thrombectomy of of portal, SM, IM and splenic veins and balloon angioplasty of SM and splenic vein on 1/29.  Anticoagulation resumed with IV heparin  afterward.  Repeat CT angio on 1/31 with occluded shunts, pulm portal vein and SMV. Repeat thrombectomy  felt to be futile.  IR nothing further to offer. -Palliative medicine consulted and evaluated patient.  Discharged home with home hospice.   Acute respiratory failure with hypoxia/possible aspiration pneumonia: CXR 1/24 showed multifocal pneumonia.  No shortness of breath but frequent  cough.  She also reports  postnasal drainage.  Respiratory symptoms improved.  On room air. -Completed 5 days of IV Zosyn .   Malignant neoplasm of pancreas: s/p chemoradiation.  Followed by Dr. Katragadda.  Currently being monitored off chemo for the last 3 months. She also went to [facility] for a Whipple's procedure but it could not be done due to the location of her tumor. Unfortunately CA 19.9 is now trending up and currently quite high.  MRI abdomen with stable pancreatic head mass.  Case was recently discussed during last hospitalization by hospitalist with Dr. Lanny oncologist on 02/09/2023, also discussed her case with her primary oncologist Dr. Rogers on 02/10/2023: Outpatient follow-up with oncology was advised at that time. -Outpatient follow-up with oncology but the doubt candidacy for further treatment. -Going home with home hospice.   Anxiety/depression: Stable -Continue bupropion , buspirone , escitalopram  and mirtazapine    Leukopenia: Unclear cause of this.  Resolved.   Hypokalemia: Likely due to diarrhea.  Resolved.   Non-anion gap metabolic acidosis: Mild   Thrombocytopenia: Resolved   Disposition: Home with home hospice.           Time spent 35 minutes  Vital signs Vitals:   02/28/23 1635 02/28/23 1936 03/01/23 0406 03/01/23 0825  BP: 116/67 124/68 122/61 117/69  Pulse: 93 93 83 87  Temp: 98.9 F (37.2 C) 99.3 F (37.4 C) 97.9 F (36.6 C)   Resp: 18 18 19 15   Height:      Weight:   66.7 kg   SpO2: 91% 93% 93% 90%  TempSrc: Oral Oral Oral   BMI (Calculated):   26.45      Discharge exam  GENERAL: No apparent distress.  Nontoxic. HEENT: MMM.  Vision and hearing grossly intact.  NECK: Supple.  No apparent JVD.  RESP:  No IWOB.  Fair aeration bilaterally. CVS:  RRR. Heart sounds normal.  ABD/GI/GU: BS+. Abd soft, NTND.  MSK/EXT:  Moves extremities. No apparent deformity. No edema.  SKIN: no apparent skin lesion or wound NEURO: Awake and alert. Oriented appropriately.  No apparent  focal neuro deficit. PSYCH: Calm. Normal affect.   Discharge Instructions Discharge Instructions     Diet general   Complete by: As directed    Increase activity slowly   Complete by: As directed       Allergies as of 03/01/2023       Reactions   Other Hives, Other (See Comments)   Cherry wood just cut- smelled it and broke out; cannot tolerate ANY cherry fragrances, either   Cherry Hives   Wound Dressing Adhesive Rash, Other (See Comments)   Band-Aids = local reaction        Medication List     STOP taking these medications    calcium  carbonate 500 MG chewable tablet Commonly known as: TUMS - dosed in mg elemental calcium    enoxaparin  60 MG/0.6ML injection Commonly known as: LOVENOX        TAKE these medications    acetaminophen  500 MG tablet Commonly known as: TYLENOL  Take 2 tablets (1,000 mg total) by mouth every 8 (eight) hours as needed for moderate pain, headache or fever. What changed: how much to take   benzonatate  100 MG capsule Commonly known as: TESSALON  Take 1 capsule (100 mg total) by mouth 3 (three) times daily as needed for cough.   buPROPion  300 MG 24 hr tablet Commonly known as: WELLBUTRIN  XL Take 300 mg by mouth daily after breakfast.   busPIRone  10 MG tablet Commonly known  as: BUSPAR  Take 20 mg by mouth 2 (two) times daily.   escitalopram  20 MG tablet Commonly known as: LEXAPRO  Take 20 mg by mouth at bedtime.   lactose free nutrition Liqd Take 237 mLs by mouth 2 (two) times daily between meals.   lidocaine -prilocaine  cream Commonly known as: EMLA  Apply a small amount to port a cath site (do not rub in) and cover with plastic wrap 1 hour prior to infusion appointments   Linzess  72 MCG capsule Generic drug: linaclotide  TAKE 1 CAPSULE BY MOUTH DAILY BEFORE BREAKFAST What changed: See the new instructions.   mirtazapine  30 MG tablet Commonly known as: REMERON  Take 15 mg by mouth at bedtime.   ondansetron  4 MG tablet Commonly  known as: ZOFRAN  Take 1 tablet (4 mg total) by mouth every 6 (six) hours as needed for nausea or vomiting.   pantoprazole  40 MG tablet Commonly known as: PROTONIX  Take 1 tablet (40 mg total) by mouth 2 (two) times daily.   QUEtiapine  25 MG tablet Commonly known as: SEROQUEL  Take 25 mg by mouth 2 (two) times daily.   sucralfate  1 g tablet Commonly known as: CARAFATE  Take 1 tablet (1 g total) by mouth 4 (four) times daily -  with meals and at bedtime for 14 days.   Vitamin D3 125 MCG (5000 UT) Caps Take 10,000 Units by mouth every morning.        Consultations: Gastroenterology Interventional radiology Palliative medicine  Procedures/Studies: 1/24-EGD showed multiple gastric ulcers, 2 with adherent clots that were treated endoscopically, gastric antral vascular ectasia, erythematous duodenopathy and moderate hiatal hernia.  1/29-TIPS revision with aspiration thrombectomy and balloon angioplasty   CT ANGIO ABD/PELVIS BRTO Result Date: 02/25/2023 CLINICAL DATA:  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 Pancreatic cancer. * Tracking Code: BO * EXAM: CTA ABDOMEN AND PELVIS WITHOUT AND WITH CONTRAST TECHNIQUE: Multidetector CT imaging of the abdomen and pelvis was performed using the standard protocol during bolus administration of intravenous contrast. Multiplanar reconstructed images and MIPs were obtained and reviewed to evaluate the vascular anatomy. RADIATION DOSE REDUCTION: This exam was performed according to the departmental dose-optimization program which includes automated exposure control, adjustment of the mA and/or kV according to patient size and/or use of iterative reconstruction technique. CONTRAST:  OMNIPAQUE  IOHEXOL  350 MG/ML SOLN COMPARISON:  MRI 02/17/2023.  CT 02/06/2023. FINDINGS: VASCULAR Aorta: Thoracic aorta has a normal course and caliber with mild atherosclerotic calcified plaque. Celiac: Distal celiac is obscured by the  streak artifact from the adjacent stent placement. The origin of the vessel has some minimal calcified plaque. Splenic artery and hepatic arteries are patent more distally. There intact branches of the distal left gastric. SMA: Patent without evidence of aneurysm, dissection, vasculitis or significant stenosis. Renals: Both renal arteries are patent without evidence of aneurysm, dissection, vasculitis, fibromuscular dysplasia or significant stenosis. IMA: Patent without evidence of aneurysm, dissection, vasculitis or significant stenosis. Inflow: Mild calcified plaque along the iliac vessels. Proximal Outflow: Slight calcified plaque along the common femoral and SFA vessels included in the imaging field. Veins: Preserved IVC. There has been placement of a tips shunt extending from the IVC just above the liver through the middle hepatic vein and then into the main portal vein. The tips shunt is occluded along its course. Is also thrombosis of the portal vein. This includes the central portions of the left and right portal vein as well as the main portal vein. 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 There are  some secondary and tertiary branches of the SMV which are patent. Review of the MIP images confirms the above findings. NON-VASCULAR Lower chest: Developing small bilateral pleural effusions with adjacent opacities. New small pericardial effusion. Hepatobiliary: Slightly heterogeneous appearing liver. Small benign-appearing hepatic cystic foci identified. Pancreas: Severe pancreatic atrophy with severe ductal dilatation in the neck, body and tail with mass lesion again identified at the head neck region. Mass estimated in size at 3.1 by 2.0 cm. This involves the portal venous confluence. Is also involvement of the course of the GDA. Small bubble of air within a SMV branch. This could be postprocedural. Spleen: Spleen is actually with 2 separate components, nonspecific. Please correlate for any history of remote injury  or other process. Adrenals/Urinary Tract: Stable adrenal glands. Parapelvic renal cysts identified. Ureters have normal course and caliber extending down to the urinary bladder. Preserved contour to the urinary bladder. Stomach/Bowel: On this non oral contrast exam large bowel is normal course and caliber. There is some scattered stool. Small bowel is nondilated. Stomach is underdistended but there is continued wall thickening and loss of normal fold pattern along the body antrum of the stomach. There is also some wall thickening along the proximal duodenal areas. High density intraluminal linear focus within the distal stomach. Of note there is some wall thickening along the distal esophagus with small hiatal hernia and some varices. Lymphatic: No specific abnormal lymph node enlargement in the abdomen and pelvis. Few prominent porta hepatic nodes are identified. Reproductive: Uterus and bilateral adnexa are unremarkable. Other: Developing mild-to-moderate ascites. Musculoskeletal: Significant streak artifact from the spinal fixation hardware. Moderate multilevel degenerative changes along the spine. Scattered degenerative changes of the pelvis. IMPRESSION: Placement of a tips shunt. The shunt is occluded. Additional stents in the area of the portal venous confluence. There is also occlusion of the port main portal vein and central mid right left polar veins. There continues to be occlusion of the SMV and first order branches. Secondary and tertiary branches of the SMV appear to be open. There is some air within a branch of the SMV along the left side centrally which could be postprocedure related. Developing mild varices. Known pancreatic neoplasm with involvement of the portal venous confluence. Developing mild-to-moderate ascites, mesenteric stranding. No free intra-air. Developing bilateral pleural effusions adjacent opacities and a small pericardial effusion. Persistent wall thickening along the stomach and  proximal duodenum with loss of fold pattern. No dilatation Electronically Signed   By: Ranell Bring M.D.   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 EXAM: 1. Ultrasound-guided vascular access to the left internal jugular vein. 2. Selective catheterization and venography of the inferior mesenteric vein, superior mesenteric vein, and splenic veins via the indwelling TIPS 3. Mechanical and aspiration thrombectomy of the TIPS, main portal vein, and central superior mesenteric, inferior mesenteric, and splenic veins. 4. Balloon angioplasty of the central superior mesenteric vein and splenic vein. MEDICATIONS: 8000 units heparin , intravenous ANESTHESIA/SEDATION: Moderate (conscious) sedation was employed during this procedure. A total of Versed  1 mg and Fentanyl  200 mcg was administered intravenously. Moderate Sedation Time: 72 minutes. The patient's level of consciousness and vital signs were monitored continuously by radiology nursing throughout the procedure  under my direct supervision. CONTRAST:  65 mL Omnipaque  300, intravenous FLUOROSCOPY TIME:  One hundred ninety mGy COMPLICATIONS: None immediate. PROCEDURE: Informed written consent was obtained from the patient after a thorough discussion of the procedural risks, benefits and alternatives. All questions were addressed. Maximal Sterile Barrier Technique was utilized including caps, mask, sterile gowns, sterile gloves, sterile drape, hand hygiene and skin antiseptic. A timeout was performed prior to the initiation of the procedure. Preprocedure ultrasound evaluation demonstrated patency of the left  internal jugular vein. The procedure was planned. Subdermal Local anesthesia was administered with 1% lidocaine  at the planned needle entry site. A small skin nick was made. Under direct ultrasound visualization, the left internal jugular vein was accessed with a 21 gauge micropuncture needle. A permanent ultrasound image was captured and stored in the record. A micropuncture sheath was introduced through which a Rosen wire was directed to the inferior vena cava. A 6 French destination sheath was then inserted and positioned in the right atrium. And in the a catheter was then directed to the hepatic vein ostium of the indwelling TIPS which was cannulated with moderate difficulty. A Rosen wire was then inserted and advanced level of the inferior mesenteric vein. Anatomy cross catheter was then placed over the wire and directed into the mid main inferior mesenteric vein. The wire was removed and inferior mesenteric venogram was performed which min straight it hepatofugal flow with occlusion of the inferior mesenteric vein centrally. The 6 French sheath was exchanged for a 16 French, 33 cm dry seal sheath. The sheath was advanced to the hepatic vein aspect of the indwelling endograft. Mechanical thrombectomy was then performed with the Revcore device through the central inferior mesenteric vein, main portal vein, and indwelling endograft. This was followed by aspiration thrombectomy with a straight 16 French flowtriever. There was small volume of chronic appearing thrombus aspirated. Balloon angioplasty was then performed throughout the TIPS and into the main portal vein with a 10 mm x 80 mm Athletis balloon. Portal venogram was then performed which demonstrated an established channel a patency through the main portal vein and into the inferior mesenteric vein. A small aspect of the central superior mesenteric vein is identified. The Navicross catheter and Glidewire intra then used to cannulate the superior mesenteric  vein. Mechanical thrombectomy was performed with the Revcore device through the central superior mesenteric vein into the main portal vein. Balloon angioplasty was then performed with a 6 mm x 80 mm Athletis balloon. Super mesenteric venogram was then performed which demonstrated significantly improved patency and hepatopetal flow through the superior mesenteric vein, main portal vein, and indwelling endograft. There is no evidence of reflux into the splenic vein. Therefore, the splenic vein was then cannulated and balloon angioplasty was performed with a 10 mm x 80 mm Athletis balloon. There is a type least near the portal confluence. Completion splenic venogram demonstrated 6 improved patency inflow the splenic vein however still subchronic synechia centrally. There was a filling defect about the hepatic vein aspect of the indwelling endograft. Therefore, additional balloon angioplasty was performed centered about the hepatic vein aspect of the indwelling endograft. Completion venogram demonstrated significantly improved patency and antegrade flow through the indwelling endograft main portal vein. 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 The catheter and sheath were removed. Hemostasis was achieved with brief manual compression. A sterile bandage was applied. The patient tolerated the procedure well was transferred back to the floor in good condition. IMPRESSION: 1. Successful mechanical and aspiration thrombectomy  of the TIPS, main portal vein, and central superior mesenteric, inferior mesenteric, and splenic veins. 2. Balloon angioplasty of the central superior mesenteric vein, splenic vein, and hepatic vein aspect of the indwelling endograft. 3. Completion venogram demonstrated significantly improved patency and antegrade flow through the indwelling endograft, main portal vein, and central superior mesenteric vein. 4. 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 PLAN: Recommend restarting  systemic anticoagulation to preserve portal and TIPS patency. Interventional radiology will follow. Ester Sides, MD [facility] Electronically Signed   By: Ester Sides M.D.   On: 02/23/2023 21:22   IR US  Guide Vasc Access Left Result Date: 02/23/2023 CLINICAL DATA:  78 year old female with history of pancreatic cancer status post radiation with development of progressive acute portal vein thrombus and recurrent gastric hemorrhage status post portal thrombectomy and TIPS placement on 02/09/2023 presenting with recurrent gastric bleeding and imaging evidence of TIPS occlusion. EXAM: 1. Ultrasound-guided vascular access to the left internal jugular vein. 2. Selective catheterization and venography of the inferior mesenteric vein, superior mesenteric vein, and splenic veins via the indwelling TIPS 3. Mechanical and aspiration thrombectomy of the TIPS, main portal vein, and central superior mesenteric, inferior mesenteric, and splenic veins. 4. Balloon angioplasty of the central superior mesenteric vein and splenic vein. MEDICATIONS: 8000 units heparin , intravenous ANESTHESIA/SEDATION: Moderate (conscious) sedation was employed during this procedure. A total of Versed  1 mg and Fentanyl  200 mcg was administered intravenously. Moderate Sedation Time: 72 minutes. The patient's level of consciousness and vital signs were monitored continuously by radiology nursing throughout the procedure under my direct supervision. CONTRAST:  65 mL Omnipaque  300, intravenous FLUOROSCOPY TIME:  One hundred ninety mGy COMPLICATIONS: None immediate. PROCEDURE: Informed written consent was obtained from the patient after a thorough discussion of the procedural risks, benefits and alternatives. All questions were addressed. Maximal Sterile Barrier Technique was utilized including caps, mask, sterile gowns, sterile gloves, sterile drape, hand hygiene and skin antiseptic. A timeout  was performed prior to the initiation of the procedure. Preprocedure ultrasound evaluation demonstrated patency of the left internal jugular vein. The procedure was planned. Subdermal Local anesthesia was administered with 1% lidocaine  at the planned needle entry site. A small skin nick was made. Under direct ultrasound visualization, the left internal jugular vein was accessed with a 21 gauge micropuncture needle. A permanent ultrasound image was captured and stored in the record. A micropuncture sheath was introduced through which a Rosen wire was directed to the inferior vena cava. A 6 French destination sheath was then inserted and positioned in the right atrium. And in the a catheter was then directed to the hepatic vein ostium of the indwelling TIPS which was cannulated with moderate difficulty. A Rosen wire was then inserted and advanced level of the inferior mesenteric vein. Anatomy cross catheter was then placed over the wire and directed into the mid main inferior mesenteric vein. The wire was removed and inferior mesenteric venogram was performed which min straight it hepatofugal flow with occlusion of the inferior mesenteric vein centrally. The 6 French sheath was exchanged for a 16 French, 33 cm dry seal sheath. The sheath was advanced to the hepatic vein aspect of the indwelling endograft. Mechanical thrombectomy was then performed with the Revcore device through the central inferior mesenteric vein, main portal vein, and indwelling endograft. This was followed by aspiration thrombectomy with a straight 16 French flowtriever. There was small volume of chronic appearing thrombus aspirated. Balloon angioplasty was then performed throughout the TIPS  and into the main portal vein with a 10 mm x 80 mm Athletis balloon. Portal venogram was then performed which demonstrated an established channel a patency through the main portal vein and into the inferior mesenteric vein. A small aspect of the central  superior mesenteric vein is identified. The Navicross catheter and Glidewire intra then used to cannulate the superior mesenteric vein. Mechanical thrombectomy was performed with the Revcore device through the central superior mesenteric vein into the main portal vein. Balloon angioplasty was then performed with a 6 mm x 80 mm Athletis balloon. Super mesenteric venogram was then performed which demonstrated significantly improved patency and hepatopetal flow through the superior mesenteric vein, main portal vein, and indwelling endograft. There is no evidence of reflux into the splenic vein. Therefore, the splenic vein was then cannulated and balloon angioplasty was performed with a 10 mm x 80 mm Athletis balloon. There is a type least near the portal confluence. Completion splenic venogram demonstrated 6 improved patency inflow the splenic vein however still subchronic synechia centrally. There was a filling defect about the hepatic vein aspect of the indwelling endograft. Therefore, additional balloon angioplasty was performed centered about the hepatic vein aspect of the indwelling endograft. Completion venogram demonstrated significantly improved patency and antegrade flow through the indwelling endograft main portal vein. 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 The catheter and sheath were removed. Hemostasis was achieved with brief manual compression. A sterile bandage was applied. The patient tolerated the procedure well was transferred back to the floor in good condition. IMPRESSION: 1. Successful mechanical and aspiration thrombectomy of the TIPS, main portal vein, and central superior mesenteric, inferior mesenteric, and splenic veins. 2. Balloon angioplasty of the central superior mesenteric vein, splenic vein, and hepatic vein aspect of the indwelling endograft. 3. Completion venogram demonstrated significantly improved patency and antegrade flow through the indwelling endograft, main portal  vein, and central superior mesenteric vein. 4. 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 PLAN: Recommend restarting systemic anticoagulation to preserve portal and TIPS patency. Interventional radiology will follow. Ester Sides, MD [facility] Radiology Electronically Signed   By: Ester Sides M.D.   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 EXAM: 1. Ultrasound-guided vascular access to the left internal jugular vein. 2. Selective catheterization and venography of the inferior mesenteric vein, superior mesenteric vein, and splenic veins via the indwelling TIPS 3. Mechanical and aspiration thrombectomy of the TIPS, main portal vein, and central superior mesenteric, inferior mesenteric, and splenic veins. 4. Balloon angioplasty of the central superior mesenteric vein and splenic vein. MEDICATIONS: 8000 units heparin , intravenous ANESTHESIA/SEDATION: Moderate (conscious) sedation was employed during this procedure. A total of Versed  1 mg and Fentanyl  200 mcg was administered intravenously. Moderate Sedation Time: 72 minutes. The patient's level of consciousness and vital signs were monitored continuously by radiology nursing throughout the procedure under my direct supervision. CONTRAST:  65 mL Omnipaque  300, intravenous FLUOROSCOPY TIME:  One hundred ninety mGy COMPLICATIONS: None immediate. PROCEDURE: Informed written consent was obtained from the patient after a thorough discussion of the procedural risks, benefits and alternatives. All questions were addressed. Maximal Sterile Barrier Technique  was utilized including caps, mask, sterile gowns,  sterile gloves, sterile drape, hand hygiene and skin antiseptic. A timeout was performed prior to the initiation of the procedure. Preprocedure ultrasound evaluation demonstrated patency of the left internal jugular vein. The procedure was planned. Subdermal Local anesthesia was administered with 1% lidocaine  at the planned needle entry site. A small skin nick was made. Under direct ultrasound visualization, the left internal jugular vein was accessed with a 21 gauge micropuncture needle. A permanent ultrasound image was captured and stored in the record. A micropuncture sheath was introduced through which a Rosen wire was directed to the inferior vena cava. A 6 French destination sheath was then inserted and positioned in the right atrium. And in the a catheter was then directed to the hepatic vein ostium of the indwelling TIPS which was cannulated with moderate difficulty. A Rosen wire was then inserted and advanced level of the inferior mesenteric vein. Anatomy cross catheter was then placed over the wire and directed into the mid main inferior mesenteric vein. The wire was removed and inferior mesenteric venogram was performed which min straight it hepatofugal flow with occlusion of the inferior mesenteric vein centrally. The 6 French sheath was exchanged for a 16 French, 33 cm dry seal sheath. The sheath was advanced to the hepatic vein aspect of the indwelling endograft. Mechanical thrombectomy was then performed with the Revcore device through the central inferior mesenteric vein, main portal vein, and indwelling endograft. This was followed by aspiration thrombectomy with a straight 16 French flowtriever. There was small volume of chronic appearing thrombus aspirated. Balloon angioplasty was then performed throughout the TIPS and into the main portal vein with a 10 mm x 80 mm Athletis balloon. Portal venogram was then performed which demonstrated an  established channel a patency through the main portal vein and into the inferior mesenteric vein. A small aspect of the central superior mesenteric vein is identified. The Navicross catheter and Glidewire intra then used to cannulate the superior mesenteric vein. Mechanical thrombectomy was performed with the Revcore device through the central superior mesenteric vein into the main portal vein. Balloon angioplasty was then performed with a 6 mm x 80 mm Athletis balloon. Super mesenteric venogram was then performed which demonstrated significantly improved patency and hepatopetal flow through the superior mesenteric vein, main portal vein, and indwelling endograft. There is no evidence of reflux into the splenic vein. Therefore, the splenic vein was then cannulated and balloon angioplasty was performed with a 10 mm x 80 mm Athletis balloon. There is a type least near the portal confluence. Completion splenic venogram demonstrated 6 improved patency inflow the splenic vein however still subchronic synechia centrally. There was a filling defect about the hepatic vein aspect of the indwelling endograft. Therefore, additional balloon angioplasty was performed centered about the hepatic vein aspect of the indwelling endograft. Completion venogram demonstrated significantly improved patency and antegrade flow through the indwelling endograft main portal vein. 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 The catheter and sheath were removed. Hemostasis was achieved with brief manual compression. A sterile bandage was applied. The patient tolerated the procedure well was transferred back to the floor in good condition. IMPRESSION: 1. Successful mechanical and aspiration thrombectomy of the TIPS, main portal vein, and central superior mesenteric, inferior mesenteric, and splenic veins. 2. Balloon angioplasty of the central superior mesenteric vein, splenic vein, and hepatic vein aspect of the indwelling endograft. 3.  Completion venogram demonstrated significantly improved patency and antegrade flow through the indwelling endograft, main portal vein, and central superior mesenteric vein.  4. 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 PLAN: Recommend restarting systemic anticoagulation to preserve portal and TIPS patency. Interventional radiology will follow. Ester Sides, MD [facility] Radiology Electronically Signed   By: Ester Sides M.D.   On: 02/23/2023 21:22   DG Chest Port 1 View Result Date: 02/18/2023 CLINICAL DATA:  Shortness of breath EXAM: PORTABLE CHEST 1 VIEW COMPARISON:  07/13/2021 FINDINGS: Cardiac shadow is stable. Right chest wall port is now seen in satisfactory position. The lungs are well aerated bilaterally. Patchy left perihilar opacity is noted as well as some left retrocardiac opacity most consistent with multifocal pneumonia. Minimal changes in the lateral aspect of the right base are noted as well. No bony abnormality is seen. IMPRESSION: Patchy areas of increased parenchymal opacity bilaterally consistent with multifocal pneumonia. Electronically Signed   By: Oneil Devonshire M.D.   On: 02/18/2023 21:47   MR Abdomen W Wo Contrast Result Date: 02/18/2023 CLINICAL DATA:  Pancreatic cancer monitoring EXAM: MRI ABDOMEN WITHOUT AND WITH CONTRAST TECHNIQUE: Multiplanar multisequence MR imaging of the abdomen was performed both before and after the administration of intravenous contrast. CONTRAST:  6mL GADAVIST  GADOBUTROL  1 MMOL/ML IV SOLN COMPARISON:  CT abdomen pelvis, 02/06/2023 FINDINGS: Lower chest: Small bilateral pleural effusions. Small hiatal hernia. Hepatobiliary: No suspicious liver lesions. Hepatic signal inversion on in and opposed phase imaging, consistent with hepatic iron deposition. No gallstones, gallbladder wall thickening, or biliary dilatation. Pancreas: No significant change in an ill-defined, infiltrative soft tissue mass of  the central pancreatic head measuring 2.8 x 1.9 cm (series 4, image 20). Parenchymal atrophy and ductal dilatation of the distal pancreas, duct measuring up to 1.2 cm (series 4, image 18). Spleen: Normal in size. Accessory spleen in the left upper quadrant (series 3, image 17) Adrenals/Urinary Tract: Adrenal glands are unremarkable. Numerous bilateral parapelvic renal cysts, benign, requiring no further follow-up or characterization. Stomach/Bowel: Stomach is within normal limits. No evidence of bowel wall thickening, distention, or inflammatory changes. Vascular/Lymphatic: Aortic atherosclerosis. 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 No enlarged abdominal lymph nodes. Other: No abdominal wall hernia or abnormality. Small volume ascites, increased compared to prior examination. Musculoskeletal: No acute or significant osseous findings. IMPRESSION: 1. No significant change in an ill-defined, infiltrative soft tissue mass of the central pancreatic head measuring 2.8 x 1.9 cm, consistent with pancreatic adenocarcinoma. 2. 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 3. Small volume ascites, increased compared to prior examination. 4. Small bilateral pleural effusions. 5. Hepatic iron deposition. Electronically Signed   By: Marolyn JONETTA Jaksch M.D.   On: 02/18/2023 14:47   MR 3D Recon At Scanner Result Date: 02/18/2023 CLINICAL DATA:  Pancreatic cancer monitoring EXAM: MRI ABDOMEN WITHOUT AND WITH CONTRAST TECHNIQUE: Multiplanar multisequence MR imaging of the abdomen was performed both before and after the administration of intravenous contrast. CONTRAST:  6mL GADAVIST  GADOBUTROL  1 MMOL/ML IV SOLN COMPARISON:  CT abdomen pelvis, 02/06/2023 FINDINGS: Lower chest: Small bilateral pleural effusions. Small hiatal hernia. Hepatobiliary:  No suspicious liver lesions. Hepatic signal inversion on in and opposed phase imaging, consistent with hepatic iron deposition. No gallstones, gallbladder wall thickening, or biliary dilatation. Pancreas: No significant change in an ill-defined, infiltrative soft tissue mass of the central pancreatic head measuring 2.8 x 1.9 cm (series 4, image 20). Parenchymal atrophy and ductal dilatation of  the distal pancreas, duct measuring up to 1.2 cm (series 4, image 18). Spleen: Normal in size. Accessory spleen in the left upper quadrant (series 3, image 17) Adrenals/Urinary Tract: Adrenal glands are unremarkable. Numerous bilateral parapelvic renal cysts, benign, requiring no further follow-up or characterization. Stomach/Bowel: Stomach is within normal limits. No evidence of bowel wall thickening, distention, or inflammatory changes. Vascular/Lymphatic: Aortic atherosclerosis. 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 No enlarged abdominal lymph nodes. Other: No abdominal wall hernia or abnormality. Small volume ascites, increased compared to prior examination. Musculoskeletal: No acute or significant osseous findings. IMPRESSION: 1. No significant change in an ill-defined, infiltrative soft tissue mass of the central pancreatic head measuring 2.8 x 1.9 cm, consistent with pancreatic adenocarcinoma. 2. 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 3. Small volume ascites, increased compared to prior examination. 4. Small bilateral pleural effusions. 5. Hepatic iron deposition. Electronically Signed   By: Marolyn JONETTA Jaksch M.D.   On: 02/18/2023 14:47   IR Tips Result Date: 02/09/2023 CLINICAL DATA:  78 year old female with history of pancreatic cancer status post radiation with development of progressive acute portal vein thrombus and  recurrent gastric hemorrhage. EXAM: 1. Ultrasound-guided access of the right internal jugular vein 2. Ultrasound-guided access of the right greater saphenous vein 3. Hepatic venogram 4. Intravascular ultrasound 5. Catheterization of the portal vein 6. Portal venogram 7. Creation of a transhepatic portal vein to hepatic vein shunt 8. Aspiration thrombectomy of the portal vein and superior mesenteric vein 9. Mechanical thrombectomy of the portal vein and superior mesenteric vein 10. Drug coated balloon angioplasty of the main portal vein and superior mesenteric vein 11. Embolization of left gastric vein MEDICATIONS: As antibiotic prophylaxis, Rocephin  1 gm IV was ordered pre-procedure and administered intravenously within one hour of incision. ANESTHESIA/SEDATION: General - as administered by the Anesthesia department CONTRAST:  One hundred ML Omnipaque  300, intravenous FLUOROSCOPY TIME:  2,580 mGy COMPLICATIONS: None immediate. PROCEDURE: The procedure was performed in concert with my partner Dr. Gordy Roulette. Informed written consent was obtained from the patient after a thorough discussion of the procedural risks, benefits and alternatives. All questions were addressed. Maximal Sterile Barrier Technique was utilized including caps, mask, sterile gowns, sterile gloves, sterile drape, hand hygiene and skin antiseptic. A timeout was performed prior to the initiation of the procedure. A preliminary ultrasound of the right groin was performed and demonstrates a patent right common femoral vein and central greater saphenous vein. A permanent ultrasound image was recorded. Using a combination of fluoroscopy and ultrasound, an access site was determined. A small dermatotomy was made at the planned puncture site. Using ultrasound guidance, access into the right greater saphenous vein was obtained with visualization of needle entry into the vessel using a standard micropuncture technique. A wire was advanced into the IVC insert  all fascial dilation performed. An 8 French, 11 cm vascular sheath was placed into the external iliac vein. Through this access site, an 102 French Accunav ICE catheter was advanced with ease under fluoroscopic guidance to the level of the intrahepatic inferior vena cava. A preliminary ultrasound of the right neck was performed and demonstrates a patent internal jugular vein. A permanent ultrasound image was recorded. Using a combination of fluoroscopy and ultrasound, an access site was determined. A small dermatotomy was made at the planned puncture site. Using ultrasound guidance, access  into the right internal jugular vein was obtained with visualization of needle entry into the vessel using a standard micropuncture technique. A wire was advanced into the IVC and serial fascial dilation performed. A 10 French tips sheath was placed into the internal jugular vein and advanced to the IVC. The jugular sheath was retracted into the right atrium and manometry was performed measuring a mean pressure of 11 mmHg. A 5 French angled tip catheter was then directed into the right hepatic vein. Hepatic venogram was performed. These images demonstrated patent hepatic vein with no stenosis. The catheter was advanced to a wedge portion of the a patent vein over which the 10 French sheath was advanced into the right hepatic vein. Using ICE ultrasound visualization the catheter as right hepatic vein as well as the portal anatomy was defined. There was acute appearing, mildly expansile heterogeneously hypoechoic thrombus throughout the portal veins. A planned exit site from the hepatic vein and puncture site from the portal vein was placed into a single sonographic plane. Under direct ultrasound visualization, the ScorpionX needle was advanced into the central right portal vein. A Glidewire Advantage was then advanced however coursed laterally in appeared to follow and extrahepatic location. Upon manipulating to preserve portal  venous access, hepatic vein access was lost with the base sheath. Therefore, the jugular sheath was then retracted into the right atrium and a 5 French angled tip catheter was directed into the middle hepatic vein. Hepatic venogram was performed. These images demonstrated patent hepatic vein with no stenosis. The catheter was advanced to a wedged portion of the patent vein over which the 10 French sheath was advanced into the middle hepatic vein. Using ICE ultrasound visualization a planned exit site from the hepatic vein and portal puncture site were placed into a single sonographic plane. Under direct ultrasound visualization, the ScorpionX needle was advanced into the central left portal vein. There is moderate redundancy in looping of the wire in the left portal vein prior to entering the main portal vein which required several manipulation techniques to reduce including balloon dilation and insertion of a stiff buddy wire. A 5 French marking pigtail catheter was then advanced over the wire into the superior mesenteric vein and wire removed. Portal venogram was performed which demonstrated occlusive thrombus throughout the central superior mesenteric vein and main portal vein. The tract was then dilated to 8 mm with an 8 mm x 8 cm Athletis balloon. A 8-10 mm by 7 + 2 cm of Viatorr endograft was placed. This was dilated to 8 mm. The indwelling 10 French sheath was then exchanged for a 16 French, 33 cm dry seal sheath. The sheath was directed to the portal aspect of the endograft. Aspiration thrombectomy was then performed in multiple passes over the wire through the main portal vein and into the central superior mesenteric vein. Moderate acute and chronic appearing thrombus were collected in the aspiration canister. A sample was sent for pathology. Repeat portal venogram demonstrated persistent occlusive appearing thrombus in the main portal vein and irregular luminal filling defects in the central superior  mesenteric vein. Therefore, balloon angioplasty was performed with a 6 mm x 8 cm Athletis balloon. Repeat portal venogram demonstrated no evidence of extravasation in mildly restored luminal gain in the central superior mesenteric vein. This region was then treated with a 6 mm x 8 cm In.Pact drug coated balloon prolonged inflation for total of 3 minutes. After angioplasty, the central superior mesenteric vein appear patent with inline flow into  the indwelling tips endograft. Additional balloon angioplasty through the tips endograft within 8 mm balloon was performed. Repeat portal venogram demonstrated minimal improved patency. Additional aspiration thrombectomy was performed through the tips endograft which yielded moderate acute appearing thrombus. Repeat portal venogram demonstrated persistent near occlusive thrombus throughout the indwelling tips endograft in main portal vein. Therefore, mechanical thrombectomy was performed through the tips endograft and main portal vein into the central superior mesenteric vein with a 6 French cleaner device undergoing multiple passes. Repeat portal venogram demonstrated improved patency and inline flow via the indwelling tips, however with multiple irregular luminal filling defects and sluggish flow. Retrograde flow was noted into the left gastric vein which filled multiple small gastroesophageal varices. The indwelling tips endograft was then dilated with a 10 mm x 80 mm Athletis balloon. Repeat portal venogram demonstrated improved patency and antegrade flow through the indwelling endograft. There is persistent retrograde filling of the left gastric vein. Therefore, a 5 French C2 catheter was used to select the left gastric vein. Left gastric venogram demonstrated retrograde flow with opacification and irregular vascularity about multifocal small gastroesophageal varices. Therefore, coil embolization was performed about the central left gastric vein with 2, 6 mm 0.035 Azur  detachable coils. Completion portal venogram demonstrated successfully embolized left gastric vein with antegrade flow via patent main portal vein in newly placed TIPS endograft, however several scattered filling defects remain in the main portal vein and in the endograft. The catheters and sheath were removed and manual compression was applied to the right internal jugular and right greater saphenous venous access sites until hemostasis was achieved. The patient was administered 60 mg of subcutaneous Lovenox . The patient was transferred to the PACU in stable condition. IMPRESSION: 1. 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 2. 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 3. Technically successful aspiration and mechanical thrombectomy of the superior mesenteric and main portal veins. 4. Technically successful plain and drug coated balloon angioplasty of the superior mesenteric and main portal veins. 5. Technically successful coil embolization of the left gastric vein. PLAN: Begin therapeutic Lovenox  for 1 month prior to transitioning back to oral anticoagulation. IR will follow while inpatient and arrange outpatient followup within 1 month after discharge. Ester Sides, MD [facility] Radiology Electronically Signed   By: Ester Sides M.D.   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 EXAM: 1. Ultrasound-guided access of the right internal jugular vein 2. Ultrasound-guided access of the right greater saphenous vein 3. Hepatic venogram 4. Intravascular ultrasound 5. Catheterization of the portal vein 6. Portal venogram 7.  Creation of a transhepatic portal vein to hepatic vein shunt 8. Aspiration thrombectomy of the portal vein and superior mesenteric vein 9. Mechanical thrombectomy of the portal vein and superior mesenteric vein 10. Drug coated balloon angioplasty of the main portal vein and superior mesenteric vein 11. Embolization of left gastric vein MEDICATIONS: As antibiotic prophylaxis, Rocephin  1 gm IV was ordered pre-procedure and administered intravenously within one hour of incision. ANESTHESIA/SEDATION: General - as administered by the Anesthesia department CONTRAST:  One hundred ML Omnipaque  300, intravenous FLUOROSCOPY TIME:  2,580 mGy COMPLICATIONS: None immediate. PROCEDURE: The procedure was performed in concert with my partner Dr. Gordy Roulette. Informed written consent  was obtained from the patient after a thorough discussion of the procedural risks, benefits and alternatives. All questions were addressed. Maximal Sterile Barrier Technique was utilized including caps, mask, sterile gowns, sterile gloves, sterile drape, hand hygiene and skin antiseptic. A timeout was performed prior to the initiation of the procedure. A preliminary ultrasound of the right groin was performed and demonstrates a patent right common femoral vein and central greater saphenous vein. A permanent ultrasound image was recorded. Using a combination of fluoroscopy and ultrasound, an access site was determined. A small dermatotomy was made at the planned puncture site. Using ultrasound guidance, access into the right greater saphenous vein was obtained with visualization of needle entry into the vessel using a standard micropuncture technique. A wire was advanced into the IVC insert all fascial dilation performed. An 8 French, 11 cm vascular sheath was placed into the external iliac vein. Through this access site, an 86 French Accunav ICE catheter was advanced with ease under fluoroscopic guidance to the level of the intrahepatic inferior vena cava.  A preliminary ultrasound of the right neck was performed and demonstrates a patent internal jugular vein. A permanent ultrasound image was recorded. Using a combination of fluoroscopy and ultrasound, an access site was determined. A small dermatotomy was made at the planned puncture site. Using ultrasound guidance, access into the right internal jugular vein was obtained with visualization of needle entry into the vessel using a standard micropuncture technique. A wire was advanced into the IVC and serial fascial dilation performed. A 10 French tips sheath was placed into the internal jugular vein and advanced to the IVC. The jugular sheath was retracted into the right atrium and manometry was performed measuring a mean pressure of 11 mmHg. A 5 French angled tip catheter was then directed into the right hepatic vein. Hepatic venogram was performed. These images demonstrated patent hepatic vein with no stenosis. The catheter was advanced to a wedge portion of the a patent vein over which the 10 French sheath was advanced into the right hepatic vein. Using ICE ultrasound visualization the catheter as right hepatic vein as well as the portal anatomy was defined. There was acute appearing, mildly expansile heterogeneously hypoechoic thrombus throughout the portal veins. A planned exit site from the hepatic vein and puncture site from the portal vein was placed into a single sonographic plane. Under direct ultrasound visualization, the ScorpionX needle was advanced into the central right portal vein. A Glidewire Advantage was then advanced however coursed laterally in appeared to follow and extrahepatic location. Upon manipulating to preserve portal venous access, hepatic vein access was lost with the base sheath. Therefore, the jugular sheath was then retracted into the right atrium and a 5 French angled tip catheter was directed into the middle hepatic vein. Hepatic venogram was performed. These images demonstrated  patent hepatic vein with no stenosis. The catheter was advanced to a wedged portion of the patent vein over which the 10 French sheath was advanced into the middle hepatic vein. Using ICE ultrasound visualization a planned exit site from the hepatic vein and portal puncture site were placed into a single sonographic plane. Under direct ultrasound visualization, the ScorpionX needle was advanced into the central left portal vein. There is moderate redundancy in looping of the wire in the left portal vein prior to entering the main portal vein which required several manipulation techniques to reduce including balloon dilation and insertion of a stiff buddy wire. A 5 French marking pigtail catheter was then advanced over  the wire into the superior mesenteric vein and wire removed. Portal venogram was performed which demonstrated occlusive thrombus throughout the central superior mesenteric vein and main portal vein. The tract was then dilated to 8 mm with an 8 mm x 8 cm Athletis balloon. A 8-10 mm by 7 + 2 cm of Viatorr endograft was placed. This was dilated to 8 mm. The indwelling 10 French sheath was then exchanged for a 16 French, 33 cm dry seal sheath. The sheath was directed to the portal aspect of the endograft. Aspiration thrombectomy was then performed in multiple passes over the wire through the main portal vein and into the central superior mesenteric vein. Moderate acute and chronic appearing thrombus were collected in the aspiration canister. A sample was sent for pathology. Repeat portal venogram demonstrated persistent occlusive appearing thrombus in the main portal vein and irregular luminal filling defects in the central superior mesenteric vein. Therefore, balloon angioplasty was performed with a 6 mm x 8 cm Athletis balloon. Repeat portal venogram demonstrated no evidence of extravasation in mildly restored luminal gain in the central superior mesenteric vein. This region was then treated with a 6 mm  x 8 cm In.Pact drug coated balloon prolonged inflation for total of 3 minutes. After angioplasty, the central superior mesenteric vein appear patent with inline flow into the indwelling tips endograft. Additional balloon angioplasty through the tips endograft within 8 mm balloon was performed. Repeat portal venogram demonstrated minimal improved patency. Additional aspiration thrombectomy was performed through the tips endograft which yielded moderate acute appearing thrombus. Repeat portal venogram demonstrated persistent near occlusive thrombus throughout the indwelling tips endograft in main portal vein. Therefore, mechanical thrombectomy was performed through the tips endograft and main portal vein into the central superior mesenteric vein with a 6 French cleaner device undergoing multiple passes. Repeat portal venogram demonstrated improved patency and inline flow via the indwelling tips, however with multiple irregular luminal filling defects and sluggish flow. Retrograde flow was noted into the left gastric vein which filled multiple small gastroesophageal varices. The indwelling tips endograft was then dilated with a 10 mm x 80 mm Athletis balloon. Repeat portal venogram demonstrated improved patency and antegrade flow through the indwelling endograft. There is persistent retrograde filling of the left gastric vein. Therefore, a 5 French C2 catheter was used to select the left gastric vein. Left gastric venogram demonstrated retrograde flow with opacification and irregular vascularity about multifocal small gastroesophageal varices. Therefore, coil embolization was performed about the central left gastric vein with 2, 6 mm 0.035 Azur detachable coils. Completion portal venogram demonstrated successfully embolized left gastric vein with antegrade flow via patent main portal vein in newly placed TIPS endograft, however several scattered filling defects remain in the main portal vein and in the endograft. The  catheters and sheath were removed and manual compression was applied to the right internal jugular and right greater saphenous venous access sites until hemostasis was achieved. The patient was administered 60 mg of subcutaneous Lovenox . The patient was transferred to the PACU in stable condition. IMPRESSION: 1. 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 2. 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 3. Technically successful aspiration and mechanical thrombectomy of the superior mesenteric and main portal veins. 4. Technically successful plain and drug coated balloon angioplasty of the superior mesenteric and main portal veins. 5. Technically successful coil embolization of the left gastric vein. PLAN: Begin therapeutic Lovenox  for 1 month prior to  transitioning back to oral anticoagulation. IR will follow while inpatient and arrange outpatient followup within 1 month after discharge. Ester Sides, MD [facility] Radiology Electronically Signed   By: Ester Sides M.D.   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 EXAM: 1. Ultrasound-guided access of the right internal jugular vein 2. Ultrasound-guided access of the right greater saphenous vein 3. Hepatic venogram 4. Intravascular ultrasound 5. Catheterization of the portal vein 6. Portal venogram 7. Creation of a transhepatic portal vein to hepatic vein shunt 8. Aspiration thrombectomy of the portal vein and superior mesenteric vein 9. Mechanical thrombectomy of the portal vein and superior mesenteric vein 10. Drug coated balloon angioplasty of the main portal vein and superior  mesenteric vein 11. Embolization of left gastric vein MEDICATIONS: As antibiotic prophylaxis, Rocephin  1 gm IV was ordered pre-procedure and administered intravenously within one hour of incision. ANESTHESIA/SEDATION: General - as administered by the Anesthesia department CONTRAST:  One hundred ML Omnipaque  300, intravenous FLUOROSCOPY TIME:  2,580 mGy COMPLICATIONS: None immediate. PROCEDURE: The procedure was performed in concert with my partner Dr. Gordy Roulette. Informed written consent was obtained from the patient after a thorough discussion of the procedural risks, benefits and alternatives. All questions were addressed. Maximal Sterile Barrier Technique was utilized including caps, mask, sterile gowns, sterile gloves, sterile drape, hand hygiene and skin antiseptic. A timeout was performed prior to the initiation of the procedure. A preliminary ultrasound of the right groin was performed and demonstrates a patent right common femoral vein and central greater saphenous vein. A permanent ultrasound image was recorded. Using a combination of fluoroscopy and ultrasound, an access site was determined. A small dermatotomy was made at the planned puncture site. Using ultrasound guidance, access into the right greater saphenous vein was obtained with visualization of needle entry into the vessel using a standard micropuncture technique. A wire was advanced into the IVC insert all fascial dilation performed. An 8 French, 11 cm vascular sheath was placed into the external iliac vein. Through this access site, an 28 French Accunav ICE catheter was advanced with ease under fluoroscopic guidance to the level of the intrahepatic inferior vena cava. A preliminary ultrasound of the right neck was performed and demonstrates a patent internal jugular vein. A permanent ultrasound image was recorded. Using a combination of fluoroscopy and ultrasound, an access site was determined. A small dermatotomy was made at the planned puncture  site. Using ultrasound guidance, access into the right internal jugular vein was obtained with visualization of needle entry into the vessel using a standard micropuncture technique. A wire was advanced into the IVC and serial fascial dilation performed. A 10 French tips sheath was placed into the internal jugular vein and advanced to the IVC. The jugular sheath was retracted into the right atrium and manometry was performed measuring a mean pressure of 11 mmHg. A 5 French angled tip catheter was then directed into the right hepatic vein. Hepatic venogram was performed. These images demonstrated patent hepatic vein with no stenosis. The catheter was advanced to a wedge portion of the a patent vein over which the 10 French sheath was advanced into the right hepatic vein. Using ICE ultrasound visualization the catheter as right hepatic vein as well as the portal anatomy was defined. There was acute appearing, mildly  expansile heterogeneously hypoechoic thrombus throughout the portal veins. A planned exit site from the hepatic vein and puncture site from the portal vein was placed into a single sonographic plane. Under direct ultrasound visualization, the ScorpionX needle was advanced into the central right portal vein. A Glidewire Advantage was then advanced however coursed laterally in appeared to follow and extrahepatic location. Upon manipulating to preserve portal venous access, hepatic vein access was lost with the base sheath. Therefore, the jugular sheath was then retracted into the right atrium and a 5 French angled tip catheter was directed into the middle hepatic vein. Hepatic venogram was performed. These images demonstrated patent hepatic vein with no stenosis. The catheter was advanced to a wedged portion of the patent vein over which the 10 French sheath was advanced into the middle hepatic vein. Using ICE ultrasound visualization a planned exit site from the hepatic vein and portal puncture site were  placed into a single sonographic plane. Under direct ultrasound visualization, the ScorpionX needle was advanced into the central left portal vein. There is moderate redundancy in looping of the wire in the left portal vein prior to entering the main portal vein which required several manipulation techniques to reduce including balloon dilation and insertion of a stiff buddy wire. A 5 French marking pigtail catheter was then advanced over the wire into the superior mesenteric vein and wire removed. Portal venogram was performed which demonstrated occlusive thrombus throughout the central superior mesenteric vein and main portal vein. The tract was then dilated to 8 mm with an 8 mm x 8 cm Athletis balloon. A 8-10 mm by 7 + 2 cm of Viatorr endograft was placed. This was dilated to 8 mm. The indwelling 10 French sheath was then exchanged for a 16 French, 33 cm dry seal sheath. The sheath was directed to the portal aspect of the endograft. Aspiration thrombectomy was then performed in multiple passes over the wire through the main portal vein and into the central superior mesenteric vein. Moderate acute and chronic appearing thrombus were collected in the aspiration canister. A sample was sent for pathology. Repeat portal venogram demonstrated persistent occlusive appearing thrombus in the main portal vein and irregular luminal filling defects in the central superior mesenteric vein. Therefore, balloon angioplasty was performed with a 6 mm x 8 cm Athletis balloon. Repeat portal venogram demonstrated no evidence of extravasation in mildly restored luminal gain in the central superior mesenteric vein. This region was then treated with a 6 mm x 8 cm In.Pact drug coated balloon prolonged inflation for total of 3 minutes. After angioplasty, the central superior mesenteric vein appear patent with inline flow into the indwelling tips endograft. Additional balloon angioplasty through the tips endograft within 8 mm balloon was  performed. Repeat portal venogram demonstrated minimal improved patency. Additional aspiration thrombectomy was performed through the tips endograft which yielded moderate acute appearing thrombus. Repeat portal venogram demonstrated persistent near occlusive thrombus throughout the indwelling tips endograft in main portal vein. Therefore, mechanical thrombectomy was performed through the tips endograft and main portal vein into the central superior mesenteric vein with a 6 French cleaner device undergoing multiple passes. Repeat portal venogram demonstrated improved patency and inline flow via the indwelling tips, however with multiple irregular luminal filling defects and sluggish flow. Retrograde flow was noted into the left gastric vein which filled multiple small gastroesophageal varices. The indwelling tips endograft was then dilated with a 10 mm x 80 mm Athletis balloon. Repeat portal venogram demonstrated improved patency and antegrade  flow through the indwelling endograft. There is persistent retrograde filling of the left gastric vein. Therefore, a 5 French C2 catheter was used to select the left gastric vein. Left gastric venogram demonstrated retrograde flow with opacification and irregular vascularity about multifocal small gastroesophageal varices. Therefore, coil embolization was performed about the central left gastric vein with 2, 6 mm 0.035 Azur detachable coils. Completion portal venogram demonstrated successfully embolized left gastric vein with antegrade flow via patent main portal vein in newly placed TIPS endograft, however several scattered filling defects remain in the main portal vein and in the endograft. The catheters and sheath were removed and manual compression was applied to the right internal jugular and right greater saphenous venous access sites until hemostasis was achieved. The patient was administered 60 mg of subcutaneous Lovenox . The patient was transferred to the PACU in  stable condition. IMPRESSION: 1. 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 2. 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 3. Technically successful aspiration and mechanical thrombectomy of the superior mesenteric and main portal veins. 4. Technically successful plain and drug coated balloon angioplasty of the superior mesenteric and main portal veins. 5. Technically successful coil embolization of the left gastric vein. PLAN: Begin therapeutic Lovenox  for 1 month prior to transitioning back to oral anticoagulation. IR will follow while inpatient and arrange outpatient followup within 1 month after discharge. Ester Sides, MD [facility] Radiology Electronically Signed   By: Ester Sides M.D.   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 EXAM: 1. Ultrasound-guided access of the right internal jugular vein 2. Ultrasound-guided access of the right greater saphenous vein 3. Hepatic venogram 4. Intravascular ultrasound 5. Catheterization of the portal vein 6. Portal venogram 7. Creation of a transhepatic portal vein to hepatic vein shunt 8. Aspiration thrombectomy of the portal vein and superior mesenteric vein 9. Mechanical thrombectomy of the portal vein and superior mesenteric vein 10. Drug coated balloon angioplasty of the main portal vein and superior mesenteric vein 11. Embolization of left gastric vein MEDICATIONS: As antibiotic prophylaxis, Rocephin  1 gm IV was ordered pre-procedure and administered intravenously within one hour of incision. ANESTHESIA/SEDATION: General - as administered by the Anesthesia department  CONTRAST:  One hundred ML Omnipaque  300, intravenous FLUOROSCOPY TIME:  2,580 mGy COMPLICATIONS: None immediate. PROCEDURE: The procedure was performed in concert with my partner Dr. Gordy Roulette. Informed written consent was obtained from the patient after a thorough discussion of the procedural risks, benefits and alternatives. All questions were addressed. Maximal Sterile Barrier Technique was utilized including caps, mask, sterile gowns, sterile gloves, sterile drape, hand hygiene and skin antiseptic. A timeout was performed prior to the initiation of the procedure. A preliminary ultrasound of the right groin was performed and demonstrates a patent right common femoral vein and central greater saphenous vein. A permanent ultrasound image was recorded. Using a combination of fluoroscopy and ultrasound, an access site was determined. A small dermatotomy was made at the planned puncture site. Using ultrasound guidance, access into the right greater saphenous vein was obtained with visualization of needle entry into the vessel using a standard micropuncture technique. A wire was advanced into the IVC insert all fascial dilation performed. An 8 French, 11 cm vascular sheath was  placed into the external iliac vein. Through this access site, an 69 French Accunav ICE catheter was advanced with ease under fluoroscopic guidance to the level of the intrahepatic inferior vena cava. A preliminary ultrasound of the right neck was performed and demonstrates a patent internal jugular vein. A permanent ultrasound image was recorded. Using a combination of fluoroscopy and ultrasound, an access site was determined. A small dermatotomy was made at the planned puncture site. Using ultrasound guidance, access into the right internal jugular vein was obtained with visualization of needle entry into the vessel using a standard micropuncture technique. A wire was advanced into the IVC and serial fascial dilation performed. A 10 French tips  sheath was placed into the internal jugular vein and advanced to the IVC. The jugular sheath was retracted into the right atrium and manometry was performed measuring a mean pressure of 11 mmHg. A 5 French angled tip catheter was then directed into the right hepatic vein. Hepatic venogram was performed. These images demonstrated patent hepatic vein with no stenosis. The catheter was advanced to a wedge portion of the a patent vein over which the 10 French sheath was advanced into the right hepatic vein. Using ICE ultrasound visualization the catheter as right hepatic vein as well as the portal anatomy was defined. There was acute appearing, mildly expansile heterogeneously hypoechoic thrombus throughout the portal veins. A planned exit site from the hepatic vein and puncture site from the portal vein was placed into a single sonographic plane. Under direct ultrasound visualization, the ScorpionX needle was advanced into the central right portal vein. A Glidewire Advantage was then advanced however coursed laterally in appeared to follow and extrahepatic location. Upon manipulating to preserve portal venous access, hepatic vein access was lost with the base sheath. Therefore, the jugular sheath was then retracted into the right atrium and a 5 French angled tip catheter was directed into the middle hepatic vein. Hepatic venogram was performed. These images demonstrated patent hepatic vein with no stenosis. The catheter was advanced to a wedged portion of the patent vein over which the 10 French sheath was advanced into the middle hepatic vein. Using ICE ultrasound visualization a planned exit site from the hepatic vein and portal puncture site were placed into a single sonographic plane. Under direct ultrasound visualization, the ScorpionX needle was advanced into the central left portal vein. There is moderate redundancy in looping of the wire in the left portal vein prior to entering the main portal vein which  required several manipulation techniques to reduce including balloon dilation and insertion of a stiff buddy wire. A 5 French marking pigtail catheter was then advanced over the wire into the superior mesenteric vein and wire removed. Portal venogram was performed which demonstrated occlusive thrombus throughout the central superior mesenteric vein and main portal vein. The tract was then dilated to 8 mm with an 8 mm x 8 cm Athletis balloon. A 8-10 mm by 7 + 2 cm of Viatorr endograft was placed. This was dilated to 8 mm. The indwelling 10 French sheath was then exchanged for a 16 French, 33 cm dry seal sheath. The sheath was directed to the portal aspect of the endograft. Aspiration thrombectomy was then performed in multiple passes over the wire through the main portal vein and into the central superior mesenteric vein. Moderate acute and chronic appearing thrombus were collected in the aspiration canister. A sample was sent for pathology. Repeat portal venogram demonstrated persistent occlusive appearing thrombus in the main portal vein  and irregular luminal filling defects in the central superior mesenteric vein. Therefore, balloon angioplasty was performed with a 6 mm x 8 cm Athletis balloon. Repeat portal venogram demonstrated no evidence of extravasation in mildly restored luminal gain in the central superior mesenteric vein. This region was then treated with a 6 mm x 8 cm In.Pact drug coated balloon prolonged inflation for total of 3 minutes. After angioplasty, the central superior mesenteric vein appear patent with inline flow into the indwelling tips endograft. Additional balloon angioplasty through the tips endograft within 8 mm balloon was performed. Repeat portal venogram demonstrated minimal improved patency. Additional aspiration thrombectomy was performed through the tips endograft which yielded moderate acute appearing thrombus. Repeat portal venogram demonstrated persistent near occlusive thrombus  throughout the indwelling tips endograft in main portal vein. Therefore, mechanical thrombectomy was performed through the tips endograft and main portal vein into the central superior mesenteric vein with a 6 French cleaner device undergoing multiple passes. Repeat portal venogram demonstrated improved patency and inline flow via the indwelling tips, however with multiple irregular luminal filling defects and sluggish flow. Retrograde flow was noted into the left gastric vein which filled multiple small gastroesophageal varices. The indwelling tips endograft was then dilated with a 10 mm x 80 mm Athletis balloon. Repeat portal venogram demonstrated improved patency and antegrade flow through the indwelling endograft. There is persistent retrograde filling of the left gastric vein. Therefore, a 5 French C2 catheter was used to select the left gastric vein. Left gastric venogram demonstrated retrograde flow with opacification and irregular vascularity about multifocal small gastroesophageal varices. Therefore, coil embolization was performed about the central left gastric vein with 2, 6 mm 0.035 Azur detachable coils. Completion portal venogram demonstrated successfully embolized left gastric vein with antegrade flow via patent main portal vein in newly placed TIPS endograft, however several scattered filling defects remain in the main portal vein and in the endograft. The catheters and sheath were removed and manual compression was applied to the right internal jugular and right greater saphenous venous access sites until hemostasis was achieved. The patient was administered 60 mg of subcutaneous Lovenox . The patient was transferred to the PACU in stable condition. IMPRESSION: 1. 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 2. 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 3. Technically  successful aspiration and mechanical thrombectomy of the superior mesenteric and main portal veins. 4. Technically successful plain and drug coated balloon angioplasty of the superior mesenteric and main portal veins. 5. Technically successful coil embolization of the left gastric vein. PLAN: Begin therapeutic Lovenox  for 1 month prior to transitioning back to oral anticoagulation. IR will follow while inpatient and arrange outpatient followup within 1 month after discharge. Ester Sides, MD [facility] - [facility] Radiology Electronically Signed   By: Ester Sides M.D.   On: 02/09/2023 22:10   IR EMBO VENOUS NOT HEMORR HEMANG  INC GUIDE ROADMAPPING Result Date: 02/09/2023 CLINICAL DATA:  78 year old female with history of pancreatic cancer status post radiation with development of progressive acute portal vein thrombus and recurrent gastric hemorrhage. EXAM: 1. Ultrasound-guided access of the right internal jugular vein 2. Ultrasound-guided access of the right greater saphenous vein 3. Hepatic venogram 4. Intravascular ultrasound 5. Catheterization of the portal vein 6. Portal venogram 7. Creation of a transhepatic portal vein to hepatic vein shunt 8. Aspiration thrombectomy of the portal vein and superior mesenteric vein 9. Mechanical thrombectomy of the portal vein  and superior mesenteric vein 10. Drug coated balloon angioplasty of the main portal vein and superior mesenteric vein 11. Embolization of left gastric vein MEDICATIONS: As antibiotic prophylaxis, Rocephin  1 gm IV was ordered pre-procedure and administered intravenously within one hour of incision. ANESTHESIA/SEDATION: General - as administered by the Anesthesia department CONTRAST:  One hundred ML Omnipaque  300, intravenous FLUOROSCOPY TIME:  2,580 mGy COMPLICATIONS: None immediate. PROCEDURE: The procedure was performed in concert with my partner Dr. Gordy Roulette. Informed written consent was obtained from the  patient after a thorough discussion of the procedural risks, benefits and alternatives. All questions were addressed. Maximal Sterile Barrier Technique was utilized including caps, mask, sterile gowns, sterile gloves, sterile drape, hand hygiene and skin antiseptic. A timeout was performed prior to the initiation of the procedure. A preliminary ultrasound of the right groin was performed and demonstrates a patent right common femoral vein and central greater saphenous vein. A permanent ultrasound image was recorded. Using a combination of fluoroscopy and ultrasound, an access site was determined. A small dermatotomy was made at the planned puncture site. Using ultrasound guidance, access into the right greater saphenous vein was obtained with visualization of needle entry into the vessel using a standard micropuncture technique. A wire was advanced into the IVC insert all fascial dilation performed. An 8 French, 11 cm vascular sheath was placed into the external iliac vein. Through this access site, an 36 French Accunav ICE catheter was advanced with ease under fluoroscopic guidance to the level of the intrahepatic inferior vena cava. A preliminary ultrasound of the right neck was performed and demonstrates a patent internal jugular vein. A permanent ultrasound image was recorded. Using a combination of fluoroscopy and ultrasound, an access site was determined. A small dermatotomy was made at the planned puncture site. Using ultrasound guidance, access into the right internal jugular vein was obtained with visualization of needle entry into the vessel using a standard micropuncture technique. A wire was advanced into the IVC and serial fascial dilation performed. A 10 French tips sheath was placed into the internal jugular vein and advanced to the IVC. The jugular sheath was retracted into the right atrium and manometry was performed measuring a mean pressure of 11 mmHg. A 5 French angled tip catheter was then  directed into the right hepatic vein. Hepatic venogram was performed. These images demonstrated patent hepatic vein with no stenosis. The catheter was advanced to a wedge portion of the a patent vein over which the 10 French sheath was advanced into the right hepatic vein. Using ICE ultrasound visualization the catheter as right hepatic vein as well as the portal anatomy was defined. There was acute appearing, mildly expansile heterogeneously hypoechoic thrombus throughout the portal veins. A planned exit site from the hepatic vein and puncture site from the portal vein was placed into a single sonographic plane. Under direct ultrasound visualization, the ScorpionX needle was advanced into the central right portal vein. A Glidewire Advantage was then advanced however coursed laterally in appeared to follow and extrahepatic location. Upon manipulating to preserve portal venous access, hepatic vein access was lost with the base sheath. Therefore, the jugular sheath was then retracted into the right atrium and a 5 French angled tip catheter was directed into the middle hepatic vein. Hepatic venogram was performed. These images demonstrated patent hepatic vein with no stenosis. The catheter was advanced to a wedged portion of the patent vein over which the 10 French sheath was advanced into the middle hepatic vein. Using ICE  ultrasound visualization a planned exit site from the hepatic vein and portal puncture site were placed into a single sonographic plane. Under direct ultrasound visualization, the ScorpionX needle was advanced into the central left portal vein. There is moderate redundancy in looping of the wire in the left portal vein prior to entering the main portal vein which required several manipulation techniques to reduce including balloon dilation and insertion of a stiff buddy wire. A 5 French marking pigtail catheter was then advanced over the wire into the superior mesenteric vein and wire removed.  Portal venogram was performed which demonstrated occlusive thrombus throughout the central superior mesenteric vein and main portal vein. The tract was then dilated to 8 mm with an 8 mm x 8 cm Athletis balloon. A 8-10 mm by 7 + 2 cm of Viatorr endograft was placed. This was dilated to 8 mm. The indwelling 10 French sheath was then exchanged for a 16 French, 33 cm dry seal sheath. The sheath was directed to the portal aspect of the endograft. Aspiration thrombectomy was then performed in multiple passes over the wire through the main portal vein and into the central superior mesenteric vein. Moderate acute and chronic appearing thrombus were collected in the aspiration canister. A sample was sent for pathology. Repeat portal venogram demonstrated persistent occlusive appearing thrombus in the main portal vein and irregular luminal filling defects in the central superior mesenteric vein. Therefore, balloon angioplasty was performed with a 6 mm x 8 cm Athletis balloon. Repeat portal venogram demonstrated no evidence of extravasation in mildly restored luminal gain in the central superior mesenteric vein. This region was then treated with a 6 mm x 8 cm In.Pact drug coated balloon prolonged inflation for total of 3 minutes. After angioplasty, the central superior mesenteric vein appear patent with inline flow into the indwelling tips endograft. Additional balloon angioplasty through the tips endograft within 8 mm balloon was performed. Repeat portal venogram demonstrated minimal improved patency. Additional aspiration thrombectomy was performed through the tips endograft which yielded moderate acute appearing thrombus. Repeat portal venogram demonstrated persistent near occlusive thrombus throughout the indwelling tips endograft in main portal vein. Therefore, mechanical thrombectomy was performed through the tips endograft and main portal vein into the central superior mesenteric vein with a 6 French cleaner device  undergoing multiple passes. Repeat portal venogram demonstrated improved patency and inline flow via the indwelling tips, however with multiple irregular luminal filling defects and sluggish flow. Retrograde flow was noted into the left gastric vein which filled multiple small gastroesophageal varices. The indwelling tips endograft was then dilated with a 10 mm x 80 mm Athletis balloon. Repeat portal venogram demonstrated improved patency and antegrade flow through the indwelling endograft. There is persistent retrograde filling of the left gastric vein. Therefore, a 5 French C2 catheter was used to select the left gastric vein. Left gastric venogram demonstrated retrograde flow with opacification and irregular vascularity about multifocal small gastroesophageal varices. Therefore, coil embolization was performed about the central left gastric vein with 2, 6 mm 0.035 Azur detachable coils. Completion portal venogram demonstrated successfully embolized left gastric vein with antegrade flow via patent main portal vein in newly placed TIPS endograft, however several scattered filling defects remain in the main portal vein and in the endograft. The catheters and sheath were removed and manual compression was applied to the right internal jugular and right greater saphenous venous access sites until hemostasis was achieved. The patient was administered 60 mg of subcutaneous Lovenox . The patient was transferred to  the PACU in stable condition. IMPRESSION: 1. 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 2. 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 3. Technically successful aspiration and mechanical thrombectomy of the superior mesenteric and main portal veins. 4. Technically successful plain and drug coated balloon angioplasty of the superior mesenteric and main portal veins. 5. Technically  successful coil embolization of the left gastric vein. PLAN: Begin therapeutic Lovenox  for 1 month prior to transitioning back to oral anticoagulation. IR will follow while inpatient and arrange outpatient followup within 1 month after discharge. Ester Sides, MD [facility] Radiology Electronically Signed   By: Ester Sides M.D.   On: 02/09/2023 22:10   CT ABDOMEN PELVIS W CONTRAST Result Date: 02/06/2023 CLINICAL DATA:  Pancreatitis, acute, severe Patient presents with abdominal pain. Radiologic records indicates history of pancreatic cancer. EXAM: CT ABDOMEN AND PELVIS WITH CONTRAST TECHNIQUE: Multidetector CT imaging of the abdomen and pelvis was performed using the standard protocol following bolus administration of intravenous contrast. RADIATION DOSE REDUCTION: This exam was performed according to the departmental dose-optimization program which includes automated exposure control, adjustment of the mA and/or kV according to patient size and/or use of iterative reconstruction technique. CONTRAST:  75mL OMNIPAQUE  IOHEXOL  350 MG/ML SOLN COMPARISON:  CT 8 days ago.  PET CT 10/28/2022 reviewed FINDINGS: Lower chest: Again seen linear atelectasis or scarring in the left greater than right lower lobe. 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 No pleural effusion. Hepatobiliary: Tiny hypodensity in the right lobe of the liver is unchanged series 3, image 9. No new intrahepatic abnormality. There is progressive portal vein thrombus, increasing volume of intrahepatic portal vein thrombus. The gallbladder is not well seen. Common bile duct is poorly defined on the current exam. Pancreas: Pancreatic atrophy with ductal dilatation, unchanged over the last 8 days. The known hypodense pancreatic mass is grossly unchanged measuring 2.2 cm series 3, image 23. This is less well-defined than on prior exam. Lesion abuts the SMV with progressive SMV thrombus.  Peripancreatic fat stranding about the pancreatic head is minor. Spleen: Bilobed appearance of the spleen. No focal splenic abnormality. Adrenals/Urinary Tract: No adrenal nodule. Bilateral parapelvic cysts. No further follow-up imaging is recommended. No hydronephrosis or renal inflammation. Moderate bladder distension, no wall thickening. Stomach/Bowel: Detailed bowel assessment is limited in the absence of enteric contrast. Paraesophageal varices. Wall thickening about the distal stomach, reference series 3, image 21. Again seen wall thickening of the duodenum with adjacent stranding, for example series 3, image 25. No small bowel obstruction or additional small bowel wall thickening. No small bowel pneumatosis. There is wall thickening about the hepatic flexure of the colon series 3, image 26, increased from prior exam. Transverse colon is redundant. Small to moderate colonic stool burden. Vascular/Lymphatic: Progressive thrombus within the superior mesenteric vein which is now occlusive. Progressive thrombus within the main and intrahepatic portal veins. The splenic vein remains patent. Increasing portosystemic collaterals. Aortic atherosclerosis. No bulky adenopathy. Reproductive: Nonacute. Other: Right upper quadrant fat stranding which is adjacent to gastric, duodenal and colonic wall thickening. No frank ascites. No free air. Right lateral lumbar hernia contains only fat. Musculoskeletal: Posterior rod with intrapedicular screw fusion L2 through L5. Additional multilevel degenerative change in the spine. No evidence of focal bone lesion. IMPRESSION: 1. Progressive thrombus within the superior mesenteric vein over the last 8 days, now occlusive. Progressive thrombus within the main and intrahepatic portal veins. 2.  Known hypodense pancreatic mass is grossly unchanged measuring 2.2 cm. This is less well-defined than on prior exam. 3. Peripancreatic fat stranding is mild. 4. Persistent and progressive wall  thickening about the distal stomach, duodenum, and hepatic flexure of the colon. Progressive right upper quadrant fat stranding. Etiology of bowel wall thickening and inflammation is indeterminate. 5. Increasing portosystemic collaterals. Paraesophageal varices. Aortic Atherosclerosis (ICD10-I70.0). Electronically Signed   By: Gladies Gasman M.D.   On: 02/06/2023 17:21   ECHOCARDIOGRAM COMPLETE Result Date: 02/01/2023    ECHOCARDIOGRAM REPORT   Patient Name:   SHEIKA COUTTS Date of Exam: 02/01/2023 Medical Rec #:  [id number]         Height:       62.5 in Accession #:    [id number]        Weight:       132.0 lb Date of Birth:  February 25, 1945        BSA:          1.612 m Patient Age:    77 years          BP:           133/76 mmHg Patient Gender: F                 HR:           73 bpm. Exam Location:  Inpatient Procedure: 2D Echo, Color Doppler and Cardiac Doppler Indications:    Pre-op exam (TIPS)  History:        Patient has prior history of Echocardiogram examinations, most                 recent 03/18/2014. Risk Factors:Hypertension.  Sonographer:    Damien Senior RDCS Referring Phys: [id number] TOYA DEL HAN IMPRESSIONS  1. Left ventricular ejection fraction, by estimation, is 55 to 60%. The left ventricle has normal function. The left ventricle has no regional wall motion abnormalities. Left ventricular diastolic parameters are consistent with Grade I diastolic dysfunction (impaired relaxation).  2. Right ventricular systolic function is normal. The right ventricular size is normal. Tricuspid regurgitation signal is inadequate for assessing PA pressure.  3. A small pericardial effusion is present. The pericardial effusion is anterior to the right ventricle. There is no evidence of cardiac tamponade.  4. The mitral valve is normal in structure. Trivial mitral valve regurgitation. No evidence of mitral stenosis.  5. The aortic valve is tricuspid. There is mild calcification of the aortic valve. Aortic valve  regurgitation is trivial. Aortic valve sclerosis is present, with no evidence of aortic valve stenosis.  6. The inferior vena cava is normal in size with greater than 50% respiratory variability, suggesting right atrial pressure of 3 mmHg. FINDINGS  Left Ventricle: Left ventricular ejection fraction, by estimation, is 55 to 60%. The left ventricle has normal function. The left ventricle has no regional wall motion abnormalities. The left ventricular internal cavity size was normal in size. There is  no left ventricular hypertrophy. Left ventricular diastolic parameters are consistent with Grade I diastolic dysfunction (impaired relaxation). Right Ventricle: The right ventricular size is normal. No increase in right ventricular wall thickness. Right ventricular systolic function is normal. Tricuspid regurgitation signal is inadequate for assessing PA pressure. Left Atrium: Left atrial size was normal in size. Right Atrium: Right atrial size was normal in size. Pericardium: A small pericardial effusion is present. The pericardial effusion is anterior to the right ventricle. There is no evidence of cardiac tamponade. Presence of epicardial fat  layer. Mitral Valve: The mitral valve is normal in structure. Trivial mitral valve regurgitation. No evidence of mitral valve stenosis. Tricuspid Valve: The tricuspid valve is normal in structure. Tricuspid valve regurgitation is trivial. No evidence of tricuspid stenosis. Aortic Valve: The aortic valve is tricuspid. There is mild calcification of the aortic valve. Aortic valve regurgitation is trivial. Aortic valve sclerosis is present, with no evidence of aortic valve stenosis. Pulmonic Valve: The pulmonic valve was normal in structure. Pulmonic valve regurgitation is trivial. No evidence of pulmonic stenosis. Aorta: The aortic root and ascending aorta are structurally normal, with no evidence of dilitation. Venous: The inferior vena cava is normal in size with greater than 50%  respiratory variability, suggesting right atrial pressure of 3 mmHg. IAS/Shunts: The atrial septum is grossly normal.  LEFT VENTRICLE PLAX 2D LVIDd:         4.10 cm   Diastology LVIDs:         2.90 cm   LV e' medial:    4.68 cm/s LV PW:         0.80 cm   LV E/e' medial:  12.0 LV IVS:        0.70 cm   LV e' lateral:   9.46 cm/s LVOT diam:     2.00 cm   LV E/e' lateral: 5.9 LV SV:         47 LV SV Index:   29 LVOT Area:     3.14 cm  RIGHT VENTRICLE RV S prime:     12.80 cm/s TAPSE (M-mode): 1.9 cm LEFT ATRIUM             Index        RIGHT ATRIUM           Index LA diam:        2.60 cm 1.61 cm/m   RA Area:     11.70 cm LA Vol (A2C):   33.8 ml 20.97 ml/m  RA Volume:   24.50 ml  15.20 ml/m LA Vol (A4C):   35.7 ml 22.15 ml/m LA Biplane Vol: 36.7 ml 22.77 ml/m  AORTIC VALVE LVOT Vmax:   69.80 cm/s LVOT Vmean:  52.400 cm/s LVOT VTI:    0.151 m  AORTA Ao Root diam: 3.10 cm Ao Asc diam:  3.20 cm MITRAL VALVE MV Area (PHT): 2.27 cm    SHUNTS MV E velocity: 56.10 cm/s  Systemic VTI:  0.15 m MV A velocity: 79.70 cm/s  Systemic Diam: 2.00 cm MV E/A ratio:  0.70 Darryle Decent MD Electronically signed by Darryle Decent MD Signature Date/Time: 02/01/2023/10:35:33 AM    Final        The results of significant diagnostics from this hospitalization (including imaging, microbiology, ancillary and laboratory) are listed below for reference.     Microbiology: No results found for this or any previous visit (from the past 240 hours).   Labs:  CBC: Recent Labs  Lab 02/23/23 0311 02/24/23 0347 02/25/23 0016 02/26/23 0347 02/27/23 0408  WBC 2.9* 3.8* 4.5 4.5 3.2*  NEUTROABS 1.9  --   --   --   --   HGB 8.0* 7.6* 7.6* 7.9* 7.1*  HCT 25.1* 24.2* 23.7* 25.1* 22.7*  MCV 91.6 91.7 91.5 91.9 92.7  PLT 192 175 186 227 194   BMP &GFR Recent Labs  Lab 02/23/23 0311 02/25/23 0016 02/27/23 0408  NA 139 135 141  K 3.9 3.0* 3.9  CL 112* 111 114*  CO2 19* 20* 22  GLUCOSE  113* 152* 108*  BUN 6* 10 9  CREATININE  0.87 0.86 0.89  CALCIUM  8.2* 7.7* 8.3*  MG 1.9 1.8 1.9  PHOS  --   --  2.3*   Estimated Creatinine Clearance: 46.5 mL/min (by C-G formula based on SCr of 0.89 mg/dL). Liver & Pancreas: Recent Labs  Lab 02/23/23 0311 02/25/23 0016 02/27/23 0408  AST 20 21  --   ALT 21 20  --   ALKPHOS 83 75  --   BILITOT 0.4 0.3  --   PROT 4.8* 4.4*  --   ALBUMIN  2.0* 1.9* 1.9*   Recent Labs  Lab 02/27/23 0408  LIPASE 16   No results for input(s): AMMONIA in the last 168 hours. Diabetic: No results for input(s): HGBA1C in the last 72 hours. No results for input(s): GLUCAP in the last 168 hours. Cardiac Enzymes: No results for input(s): CKTOTAL, CKMB, CKMBINDEX, TROPONINI in the last 168 hours. No results for input(s): PROBNP in the last 8760 hours. Coagulation Profile: Recent Labs  Lab 02/23/23 0311  INR 1.1   Thyroid Function Tests: No results for input(s): TSH, T4TOTAL, FREET4, T3FREE, THYROIDAB in the last 72 hours. Lipid Profile: No results for input(s): CHOL, HDL, LDLCALC, TRIG, CHOLHDL, LDLDIRECT in the last 72 hours. Anemia Panel: No results for input(s): VITAMINB12, FOLATE, FERRITIN, TIBC, IRON, RETICCTPCT in the last 72 hours. Urine analysis:    Component Value Date/Time   COLORURINE YELLOW 05/06/2022 1225   APPEARANCEUR CLEAR 05/06/2022 1225   LABSPEC 1.017 05/06/2022 1225   PHURINE 5.0 05/06/2022 1225   GLUCOSEU NEGATIVE 05/06/2022 1225   HGBUR NEGATIVE 05/06/2022 1225   BILIRUBINUR NEGATIVE 05/06/2022 1225   KETONESUR NEGATIVE 05/06/2022 1225   PROTEINUR NEGATIVE 05/06/2022 1225   NITRITE NEGATIVE 05/06/2022 1225   LEUKOCYTESUR NEGATIVE 05/06/2022 1225   Sepsis Labs: Invalid input(s): PROCALCITONIN, LACTICIDVEN   SIGNED:  Maelys Kinnick T Shawntina Diffee, MD  [facility] 03/01/2023, 12:35 PM

## 2023-03-01 NOTE — TOC Progression Note (Signed)
 Transition of Care [facility]) - Progression Note    Patient Details  Name: Sheryl Bender MRN: [id number] Date of Birth: 1945-11-13  Transition [facility] Care [facility]) CM/SW Contact  Rosaline JONELLE Joe, RN Phone Number: 03/01/2023, 11:57 AM  Clinical Narrative:    CM spoke with [facility] and they are aware that patient is discharging home with the son.  Discharge summary and last palliative note will be faxed to [facility].        Expected Discharge Plan and Services         Expected Discharge Date: 03/01/23                                     Social Determinants of Health (SDOH) Interventions SDOH Screenings   Food Insecurity: No Food Insecurity (02/18/2023)  Housing: Low Risk  (02/18/2023)  Transportation Needs: No Transportation Needs (02/18/2023)  Utilities: Not At Risk (02/18/2023)  Social Connections: Moderately Integrated (02/18/2023)  Tobacco Use: Low Risk  (02/18/2023)    Readmission Risk Interventions    02/28/2023   10:34 AM 02/21/2023   12:31 PM 01/31/2023   12:36 PM  Readmission Risk Prevention Plan  Transportation Screening Complete Complete Complete  HRI or Home Care Consult   Complete  Social Work Consult for Recovery Care Planning/Counseling   Complete  Palliative Care Screening   Not Applicable  Medication Review Oceanographer) Complete Complete Complete  PCP or Specialist appointment within 3-5 days of discharge Complete Complete   HRI or Home Care Consult Complete Complete   SW Recovery Care/Counseling Consult Complete Complete   Palliative Care Screening Complete Complete   Skilled Nursing Facility Not Applicable Patient Refused

## 2023-03-01 NOTE — Progress Notes (Incomplete)
   [facility] [address] [place] [address]    Clinic Day:  03/01/2023  Referring physician: Ava Lei, DO  Patient Care Team: Ava Lei, DO as PCP - General (Family Medicine) Riley Cheadle Windsor Hatcher, MD as Consulting Physic

## 2023-03-01 NOTE — Plan of Care (Signed)
  Problem: Pain Managment: Goal: General experience of comfort will improve and/or be controlled Outcome: Progressing   Problem: Safety: Goal: Ability to remain free from injury will improve Outcome: Progressing   Problem: Skin Integrity: Goal: Risk for impaired skin integrity will decrease Outcome: Progressing

## 2023-03-02 ENCOUNTER — Inpatient Hospital Stay: Payer: Medicare Other | Admitting: Hematology

## 2023-03-02 ENCOUNTER — Other Ambulatory Visit: Payer: Self-pay | Admitting: *Deleted

## 2023-03-02 DIAGNOSIS — D509 Iron deficiency anemia, unspecified: Secondary | ICD-10-CM

## 2023-03-02 DIAGNOSIS — D62 Acute posthemorrhagic anemia: Secondary | ICD-10-CM

## 2023-03-03 ENCOUNTER — Inpatient Hospital Stay: Payer: Medicare Other

## 2023-03-03 ENCOUNTER — Inpatient Hospital Stay: Payer: Medicare Other | Attending: Hematology | Admitting: Hematology

## 2023-03-03 VITALS — BP 116/64 | HR 99 | Temp 97.0°F | Resp 18 | Wt 136.7 lb

## 2023-03-03 DIAGNOSIS — R188 Other ascites: Secondary | ICD-10-CM | POA: Diagnosis not present

## 2023-03-03 DIAGNOSIS — Z86711 Personal history of pulmonary embolism: Secondary | ICD-10-CM | POA: Insufficient documentation

## 2023-03-03 DIAGNOSIS — D509 Iron deficiency anemia, unspecified: Secondary | ICD-10-CM | POA: Diagnosis not present

## 2023-03-03 DIAGNOSIS — D62 Acute posthemorrhagic anemia: Secondary | ICD-10-CM

## 2023-03-03 DIAGNOSIS — C253 Malignant neoplasm of pancreatic duct: Secondary | ICD-10-CM | POA: Diagnosis not present

## 2023-03-03 DIAGNOSIS — R140 Abdominal distension (gaseous): Secondary | ICD-10-CM | POA: Diagnosis not present

## 2023-03-03 DIAGNOSIS — I81 Portal vein thrombosis: Secondary | ICD-10-CM | POA: Insufficient documentation

## 2023-03-03 DIAGNOSIS — Z923 Personal history of irradiation: Secondary | ICD-10-CM | POA: Diagnosis not present

## 2023-03-03 LAB — CBC WITH DIFFERENTIAL/PLATELET
Abs Immature Granulocytes: 0.02 10*3/uL (ref 0.00–0.07)
Basophils Absolute: 0 10*3/uL (ref 0.0–0.1)
Basophils Relative: 0 %
Eosinophils Absolute: 0.1 10*3/uL (ref 0.0–0.5)
Eosinophils Relative: 2 %
HCT: 26.4 % — ABNORMAL LOW (ref 36.0–46.0)
Hemoglobin: 8.4 g/dL — ABNORMAL LOW (ref 12.0–15.0)
Immature Granulocytes: 0 %
Lymphocytes Relative: 12 %
Lymphs Abs: 0.6 10*3/uL — ABNORMAL LOW (ref 0.7–4.0)
MCH: 29.5 pg (ref 26.0–34.0)
MCHC: 31.8 g/dL (ref 30.0–36.0)
MCV: 92.6 fL (ref 80.0–100.0)
Monocytes Absolute: 0.3 10*3/uL (ref 0.1–1.0)
Monocytes Relative: 8 %
Neutro Abs: 3.5 10*3/uL (ref 1.7–7.7)
Neutrophils Relative %: 78 %
Platelets: 246 10*3/uL (ref 150–400)
RBC: 2.85 MIL/uL — ABNORMAL LOW (ref 3.87–5.11)
RDW: 18.8 % — ABNORMAL HIGH (ref 11.5–15.5)
WBC: 4.5 10*3/uL (ref 4.0–10.5)
nRBC: 0 % (ref 0.0–0.2)

## 2023-03-03 LAB — IRON AND TIBC
Iron: 29 ug/dL (ref 28–170)
Saturation Ratios: 12 % (ref 10.4–31.8)
TIBC: 242 ug/dL — ABNORMAL LOW (ref 250–450)
UIBC: 213 ug/dL

## 2023-03-03 LAB — SAMPLE TO BLOOD BANK

## 2023-03-03 LAB — FERRITIN: Ferritin: 59 ng/mL (ref 11–307)

## 2023-03-03 NOTE — Patient Instructions (Addendum)
 [facility] at [facility] Discharge Instructions   You were seen and examined today by Dr. Rogers.  He reviewed the results of your lab work which are mostly normal/stable. Your hemoglobin 8.4. We will arrange for you to have a couple iron infusions to help your hemoglobin.   We will arrange for you to have the fluid pulled off your belly tomorrow.   Return as scheduled.    Thank you for choosing [facility] at [facility] to provide your oncology and hematology care.  To afford each patient quality time with our provider, please arrive at least 15 minutes before your scheduled appointment time.   If you have a lab appointment with the Cancer Center please come in thru the Main Entrance and check in at the main information desk.  You need to re-schedule your appointment should you arrive 10 or more minutes late.  We strive to give you quality time with our providers, and arriving late affects you and other patients whose appointments are after yours.  Also, if you no show three or more times for appointments you may be dismissed from the clinic at the providers discretion.     Again, thank you for choosing [facility].  Our hope is that these requests will decrease the amount of time that you wait before being seen by our physicians.       _____________________________________________________________  Should you have questions after your visit to [facility], please contact our office at [phone] and follow the prompts.  Our office hours are 8:00 a.m. and 4:30 p.m. Monday - Friday.  Please note that voicemails left after 4:00 p.m. may not be returned until the following business day.  We are closed weekends and major holidays.  You do have access to a nurse 24-7, just call the main number to the clinic [phone] and do not press any options, hold on the line and a nurse will answer the phone.    For  prescription refill requests, have your pharmacy contact our office and allow 72 hours.    Due to Covid, you will need to wear a mask upon entering the hospital. If you do not have a mask, a mask will be given to you at the Main Entrance upon arrival. For doctor visits, patients may have 1 support person age 80 or older with them. For treatment visits, patients can not have anyone with them due to social distancing guidelines and our immunocompromised population.

## 2023-03-03 NOTE — Progress Notes (Signed)
 [facility] [address], [place] [address]    Clinic Day:  03/03/2023  Referring physician: Lonna Millman, DO  Patient Care Team: Sheryl Millman, DO as PCP - General (Family Medicine) Sheryl Bender, Sheryl HERO, MD as Consulting Physician (Gastroenterology) Sheryl Hai, MD as Medical Oncologist (Medical Oncology) Sheryl Joesph SQUIBB, RN as Oncology Nurse Navigator (Medical Oncology)   ASSESSMENT & PLAN:   Assessment: 1. 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 - EGD/EUS (07/09/2021) by Dr. Wilhelmenia: The region where the PD is strictured, darker appearance was identified.  There was no clear hypoechoic mass.  Endosonographic appearance suggestive of either chronic pancreatitis changes versus possible malignancy.  Endosonographic staging T1 N0 MX.  Pancreatic parenchymal abnormalities consisting of atrophy and hyperechoic strands.  No sign of significant pathology in the CBD.  No malignant appearing lymph nodes in the celiac region, peripancreatic region and porta hepatic region - Pathology (pancreatic duct FNA): Malignant cells consistent with adenocarcinoma - CA 19-9: 31 (0-35) - CTAP (07/13/2021): Fluid collection extending posterior to the gastric antrum and ventral to the pancreas.  Elongated collection extends to the greater curvature of the stomach with inflammation surrounding the collection suggestive of a pancreatic pseudocyst.  Chronic dilatation of pancreatic duct with abrupt termination of the duct dilatation in the head of the pancreas. - CT chest (07/16/2021): No evidence of metastatic disease in the chest. - 8 to 9 pound weight loss in the last 3 months, decreased appetite. - PET scan (07/30/2021): Faint focus of increased metabolic activity, SUV 3.6, measuring 9 mm at the junction of the pancreatic head and body, suspicious for pancreatic  adenocarcinoma.  No obvious extrapancreatic spread of tumor.  Cystic lesion along the posterior margin of the stomach and anterior margin of the pancreatic head favored pseudocyst.  Small amount of pelvic ascites. - MRI of the abdomen pancreatic protocol (08/03/2021): Similar appearance of the pancreas, with abrupt transition from dilated to decompressed duct in the pancreatic head with no evidence of underlying mass.  Pseudocyst enlarged position between pancreatic head and GE junction with significant mass effect upon the pyloric region and gastric outlet obstruction radiologically.  No evidence of abdominal metastatic disease. - FOLFOX on 09/16/2021, FOLFIRINOX cycle 2 on 09/30/2021 - 02/15/2022: Staging laparoscopy, exploratory laparotomy.  Whipple's aborted due to extensive thick scarring with obliteration of tissue planes preventing safe dissection of SMV. - XRT with Xeloda  started on 04/05/2022, Xeloda  dose reduced to 1000 mg twice daily on 04/20/2022 due to grade 1 HFSR, XRT completed on 05/18/2022   2. Social/family history: - She is accompanied by her son Sheryl Bender today.  She lives at home by herself.  She is independent of ADLs and IADLs.  She did office work prior to retirement.  Non-smoker and nonalcoholic. - Brother (alcoholic) and maternal half uncle had pancreatitis.  No history of malignancy.  3. Pulmonary embolism: - She was diagnosed with pulmonary embolism in January 2023 and then will hospital.  There does not appear to be any provoking factors for PE.  Reportedly Doppler was negative for DVT.  She was treated with Eliquis  for 60 days, discontinued after that.    Plan: 1. Stage I (T1 N0 MX) pancreatic adenocarcinoma: - She had recent multiple hospitalizations secondary to GI bleeding. - I have reviewed recent MRI of which showed stable pancreatic mass with no evidence of spread. 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  She had reclotted again.   I reviewed hospitalization records. - Today she has abdominal distention and has 7 pound weight gain. - I have recommended ultrasound evaluation and paracentesis. - We will schedule her for CBC every 2 to 4 weeks as needed. - We talked about palliative/hospice care.  She is already enrolled in palliative care.  She reports that she is not ready for hospice yet. - She has follow-ups with Dr. Cindie and Dr. Jennefer. - I have suggested that we check her CBC every 2 weeks and transfuse as needed. - RTC 8 weeks for follow-up with me.   2.  Iron deficiency anemia: - She received Feraheme  in the past.  Hemoglobin today is 8.4.  Ferritin is 59 and percent saturation 12.  As she is having active bleeding, I have recommended Feraheme  weekly x 2.     Orders Placed This Encounter  Procedures   US  Paracentesis    Albumin  50 g IV    Standing Status:   Future    Expected Date:   03/04/2023    Expiration Date:   03/02/2024    If therapeutic, is there a maximum amount of fluid to be removed?:   No    Are labs required for specimen collection?:   No    Is Albumin  medication needed?:   Yes    A seperate Albumin  medication order is required.:   I understand I need to place a separate order for albumin     Reason for Exam (SYMPTOM  OR DIAGNOSIS REQUIRED):   ascites    Preferred imaging location?:   [facility]    Release to patient:   Immediate   US  Paracentesis    Albumin  50 g    Standing Status:   Future    Expiration Date:   03/02/2024    If therapeutic, is there a maximum amount of fluid to be removed?:   No    Are labs required for specimen collection?:   No    Is Albumin  medication needed?:   Yes    A seperate Albumin  medication order is required.:   I understand I need to place a separate order for albumin     Reason for Exam (SYMPTOM  OR DIAGNOSIS REQUIRED):   ascites    Preferred imaging location?:   [facility]   US  Paracentesis    Albumin  50 g    Standing Status:   Future     Expiration Date:   03/02/2024    If therapeutic, is there a maximum amount of fluid to be removed?:   No    Are labs required for specimen collection?:   No    Is Albumin  medication needed?:   Yes    A seperate Albumin  medication order is required.:   I understand I need to place a separate order for albumin     Reason for Exam (SYMPTOM  OR DIAGNOSIS REQUIRED):   ascites    Preferred imaging location?:   [facility]   US  Paracentesis    Albumin  50 g    Standing Status:   Future    Expiration Date:   03/02/2024    If therapeutic, is there a maximum amount of fluid to be removed?:   No    Are labs required for specimen collection?:   No    Is Albumin  medication needed?:   Yes    A seperate Albumin  medication order is required.:  I understand I need to place a separate order for albumin     Reason for Exam (SYMPTOM  OR DIAGNOSIS REQUIRED):   ascites    Preferred imaging location?:   [facility]   US  Paracentesis    Albumin  50 g    Standing Status:   Future    Expiration Date:   03/02/2024    If therapeutic, is there a maximum amount of fluid to be removed?:   No    Are labs required for specimen collection?:   No    Is Albumin  medication needed?:   Yes    A seperate Albumin  medication order is required.:   I understand I need to place a separate order for albumin     Reason for Exam (SYMPTOM  OR DIAGNOSIS REQUIRED):   ascites    Preferred imaging location?:   [facility]   CBC    Standing Status:   Standing    Number of Occurrences:   10    Expiration Date:   03/02/2024   Sample to Blood Bank(Blood Bank Hold)    Standing Status:   Standing    Number of Occurrences:   10    Expiration Date:   03/02/2024     Sheryl Bender,acting as a scribe for Alean Stands, MD.,have documented all relevant documentation on the behalf of Alean Stands, MD,as directed by  Alean Stands, MD while in the presence of Alean Stands, MD.  I, Alean Stands  MD, have reviewed the above documentation for accuracy and completeness, and I agree with the above.     Alean Stands, MD   2/6/20255:11 PM  CHIEF COMPLAINT:   Diagnosis: pancreatic cancer    Cancer Staging  Malignant neoplasm of pancreas [facility]) Staging form: Exocrine Pancreas, AJCC 8th Edition - Clinical stage from 07/29/2021: Stage IA (cT1, cN0, cM0) - Unsigned    Prior Therapy: 1. FOLFIRINOX 2.  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 VUS in ATM called c.4367G>A and in [facility] called c.238G>A also identified. The remainder of testing was negative/normal. The report date is 09/20/2021.  The CancerNext-Expanded + RNAinsight gene panel offered by [employer] and includes sequencing and rearrangement analysis for the following 77 genes: IP, ALK, APC*, ATM*, AXIN2, BAP1, BARD1, BLM, BMPR1A, BRCA1*, BRCA2*, BRIP1*, CDC73, CDH1*,CDK4, CDKN1B, CDKN2A, CHEK2*, CTNNA1, DICER1, FANCC, FH, FLCN, GALNT12, KIF1B, LZTR1, MAX, MEN1, MET, MLH1*, MSH2*, MSH3, MSH6*, MUTYH*, NBN, NF1*, NF2, NTHL1, PALB2*, PHOX2B, PMS2*, POT1, PRKAR1A, PTCH1, PTEN*, RAD51C*, RAD51D*,RB1, RECQL, RET, SDHA, SDHAF2, SDHB, SDHC, SDHD, SMAD4, SMARCA4, SMARCB1, SMARCE1, STK11, SUFU, TMEM127, TP53*,TSC1, TSC2, VHL and XRCC2 (sequencing and deletion/duplication); EGFR, EGLN1, HOXB13, KIT, MITF, PDGFRA, POLD1 and POLE (sequencing only); EPCAM and GREM1 (deletion/duplication only).       INTERVAL HISTORY:   Delorese is a 78 y.o. female presenting to clinic today for follow up of pancreatic cancer. She was last seen by me on 11/02/22.  Since her last visit, she underwent several EGD's  and colonoscopies due to being admitted to the hospital for GI hemorrhaging on 12/27/22, 01/12/23. 01/29/23, 02/06/23, and 02/17/23. Her colonoscopy on 12/29/22 found a polypectomy on the sigmoid of the colon showing tubular adenoma, negative for high-grade dysplasia or malignancy. 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 Jennefer during her 1/13 hospital stay. Pathology of the portal vein thrombus revealed: organizing thrombus with fragments of columnar mucosa favoring contamination from the procedure and no evidence of malignancy. Her most recent EGD from 1/24 showed multiple gastric ulcers (treated endoscopically), gastric antral vascular ectasia, erythematous duodenopathy and moderate hiatal hernia. During her last hospital stay she underwent TIPS thrombectomy, conical aspiration and thrombectomy of portal, SM, IM and splenic veins and balloon angioplasty of SM and splenic vein. These procedures did not resolve thrombosis. She has been referred to palliative and has discontinued anticoagulation.   MRI of the abdomen done on 02/17/23 showed: No significant change in an ill-defined, infiltrative soft tissue mass of the central pancreatic head measuring 2.8 x 1.9 cm, consistent with pancreatic adenocarcinoma. 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 Small volume ascites, increased compared to prior examination. Small bilateral pleural effusions. Hepatic iron  deposition.   Today, she states that she is doing well overall. Her appetite level is at 50%. Her energy level is at 30-40%. She is accompanied by her son.  She reports abdominal distention that is uncomfortable and is agreeable to see radiology for potential paracentesis. She is able to eat, though it is in small portions with 4-5 small meals a day. She also occasionally drinks Boost/Ensure and has gained 7 pounds since her last visit. Isebella  is able to walk around the house and do hygiene activities, though she frequently rests and has SOB upon exertion.   Aliveah has a phone call with Dr. Jennefer tomorrow and an appointment with Dr. Cindie in the future. She denies any BRBPR or melena.   PAST MEDICAL HISTORY:   Past Medical History: Past Medical History:  Diagnosis Date   Anxiety    Arthritis    Depression    Dysrhythmia    hx palpitations greater than 5 yrs -neg echo, stress per pt ? where or dr   GERD (gastroesophageal reflux disease)    occ   Nausea & vomiting 08/24/2021   Pancreatic adenocarcinoma (HCC)    Pancreatic pseudocyst    Port-A-Cath in place 09/15/2021   Pulmonary embolism [facility])    September 2022    Surgical History: Past Surgical History:  Procedure Laterality Date   ANTERIOR LAT LUMBAR FUSION Right 12/16/2015   Procedure: RIGHT LUMBAR TWO-THREE, LUMBAR THREE-FOUR, LUMBAR FOUR-FIVE ANTEROLATERAL LUMBAR INTERBODY FUSION;  Surgeon: Fairy Levels, MD;  Location: [facility] OR;  Service: Neurosurgery;  Laterality: Right;   BALLOON DILATION N/A 08/27/2021   Procedure: BALLOON DILATION;  Surgeon: Sheryl Bender Aloha Raddle., MD;  Location: [facility];  Service: Gastroenterology;  Laterality: N/A;   BIOPSY  07/09/2021   Procedure: BIOPSY;  Surgeon: Sheryl Bender Aloha Raddle., MD;  Location[facility] ENDOSCOPY;  Service: Gastroenterology;;   COLONOSCOPY WITH PROPOFOL  N/A 12/29/2022   Procedure: COLONOSCOPY WITH PROPOFOL ;  Surgeon: Sheryl Bender Sheryl HERO, MD;  Location: [facility];  Service:  Endoscopy;  Laterality: N/A;   CYST GASTROSTOMY  08/27/2021   Procedure: CYST GASTROSTOMY;  Surgeon: Sheryl Bender Aloha Raddle., MD;  Location: [facility] ENDOSCOPY;  Service: Gastroenterology;;   ESOPHAGOGASTRODUODENOSCOPY N/A 07/09/2021   Procedure: ESOPHAGOGASTRODUODENOSCOPY (EGD);  Surgeon: Sheryl Bender Aloha Raddle., MD;  Location: [facility] ENDOSCOPY;  Service: Gastroenterology;  Laterality: N/A;   ESOPHAGOGASTRODUODENOSCOPY N/A 02/07/2023   Procedure: ESOPHAGOGASTRODUODENOSCOPY (EGD);  Surgeon: Federico Rosario BROCKS, MD;  Location: [facility] ENDOSCOPY;  Service: Gastroenterology;  Laterality: N/A;   ESOPHAGOGASTRODUODENOSCOPY (EGD) WITH PROPOFOL  N/A 08/27/2021   Procedure: ESOPHAGOGASTRODUODENOSCOPY (EGD) WITH PROPOFOL ;  Surgeon: Sheryl Bender Aloha Raddle., MD;  Location: [facility];  Service: Gastroenterology;  Laterality: N/A;   ESOPHAGOGASTRODUODENOSCOPY (EGD) WITH PROPOFOL  N/A 10/08/2021   Procedure: ESOPHAGOGASTRODUODENOSCOPY (EGD) WITH PROPOFOL ;  Surgeon: Sheryl Bender Aloha Raddle., MD;  Location: [facility];  Service: Gastroenterology;  Laterality: N/A;   ESOPHAGOGASTRODUODENOSCOPY (EGD) WITH PROPOFOL  N/A 12/29/2022   Procedure: ESOPHAGOGASTRODUODENOSCOPY (EGD) WITH PROPOFOL ;  Surgeon: Sheryl Bender Sheryl HERO, MD;  [facility];  Service: Endoscopy;  Laterality: N/A;   ESOPHAGOGASTRODUODENOSCOPY (EGD) WITH PROPOFOL  N/A 01/13/2023   Procedure: ESOPHAGOGASTRODUODENOSCOPY (EGD) WITH PROPOFOL ;  Surgeon: Cinderella Deatrice FALCON, MD;  [facility];  Service: Endoscopy;  Laterality: N/A;   ESOPHAGOGASTRODUODENOSCOPY (EGD) WITH PROPOFOL  N/A 01/30/2023   Procedure: ESOPHAGOGASTRODUODENOSCOPY (EGD) WITH PROPOFOL ;  Surgeon: Sheryl Bender Carlin POUR, DO;  [facility];  Service: Endoscopy;  Laterality: N/A;   ESOPHAGOGASTRODUODENOSCOPY (EGD) WITH PROPOFOL  N/A 02/18/2023   Procedure: ESOPHAGOGASTRODUODENOSCOPY (EGD) WITH PROPOFOL ;  Surgeon: Charlanne Groom, MD;  Location: [facility] ENDOSCOPY;  Service: Gastroenterology;  Laterality: N/A;    EUS N/A 07/09/2021   Procedure: UPPER ENDOSCOPIC ULTRASOUND (EUS) RADIAL;  Surgeon: Sheryl Bender Aloha Raddle., MD;  Location: [facility] ENDOSCOPY;  Service: Gastroenterology;  Laterality: N/A;   EUS N/A 08/27/2021   Procedure: UPPER ENDOSCOPIC ULTRASOUND (EUS) LINEAR;  Surgeon: Sheryl Bender Aloha Raddle., MD;  Location: [facility];  Service: Gastroenterology;  Laterality: N/A;   FINE NEEDLE ASPIRATION  07/09/2021   Procedure: FINE NEEDLE ASPIRATION (FNA) LINEAR;  Surgeon: Sheryl Bender Aloha Raddle., MD;  Location: [facility] ENDOSCOPY;  Service: Gastroenterology;;   HEMOSTASIS CLIP PLACEMENT  12/29/2022   Procedure: HEMOSTASIS CLIP PLACEMENT;  Surgeon: Sheryl Bender Sheryl HERO, MD;  [facility];  Service: Endoscopy;;   HEMOSTASIS CLIP PLACEMENT  02/18/2023   Procedure: HEMOSTASIS CLIP PLACEMENT;  Surgeon: Charlanne Groom, MD;  Location: [facility] ENDOSCOPY;  Service: Gastroenterology;;   HEMOSTASIS CONTROL  02/18/2023   Procedure: HEMOSTASIS CONTROL;  Surgeon: Charlanne Groom, MD;  Location: [facility] ENDOSCOPY;  Service: Gastroenterology;;   HOT HEMOSTASIS  12/29/2022   Procedure: HOT HEMOSTASIS (ARGON PLASMA COAGULATION/BICAP);  Surgeon: Sheryl Bender Sheryl HERO, MD;  [facility];  Service: Endoscopy;;   HOT HEMOSTASIS  01/13/2023   Procedure: HOT HEMOSTASIS (ARGON PLASMA COAGULATION/BICAP);  Surgeon: Cinderella Deatrice FALCON, MD;  [facility];  Service: Endoscopy;;  used apc and goldprobe   HOT HEMOSTASIS  01/30/2023   Procedure: HOT HEMOSTASIS (ARGON PLASMA COAGULATION/BICAP);  Surgeon: Sheryl Bender Carlin POUR, DO;  [facility];  Service: Endoscopy;;   HOT HEMOSTASIS N/A 02/07/2023   Procedure: HOT HEMOSTASIS (ARGON PLASMA COAGULATION/BICAP);  Surgeon: Federico Rosario BROCKS, MD;  Location: [facility] ENDOSCOPY;  Service: Gastroenterology;  Laterality: N/A;   HOT HEMOSTASIS N/A 02/18/2023   Procedure: HOT HEMOSTASIS (ARGON PLASMA COAGULATION/BICAP);  Surgeon: Charlanne Groom, MD;  Location: [facility] ENDOSCOPY;  Service: Gastroenterology;   Laterality: N/A;   IR EMBO VENOUS NOT HEMORR HEMANG  INC GUIDE ROADMAPPING  02/09/2023   IR IMAGING GUIDED  PORT INSERTION  09/14/2021   IR THROMBECT SEC MECH MOD SED  02/23/2023   IR THROMBECT VENO MECH MOD SED  02/09/2023   IR TIPS  02/09/2023   IR TIPS REVISION MOD SED  02/23/2023   IR US  GUIDE VASC ACCESS LEFT  02/23/2023   IR US  GUIDE VASC ACCESS RIGHT  02/09/2023   IR US  GUIDE VASC ACCESS RIGHT  02/09/2023   LAPAROSCOPY N/A 02/15/2022   Procedure: STAGING DIAGNOSTIC;  Surgeon: Dasie Leonor CROME, MD;  Location: [facility] OR;  Service: General;  Laterality: N/A;   LUMBAR PERCUTANEOUS PEDICLE SCREW 3 LEVEL Bilateral 12/16/2015   Procedure: PERCUTANEOUS PEDICLE SCREWS BILATERALLY AT LUMBAR TWO-FIVE;  Surgeon: Fairy Levels, MD;  Location: [facility] OR;  Service: Neurosurgery;  Laterality: Bilateral;   PANCREATIC STENT PLACEMENT  08/27/2021   Procedure: PANCREATIC STENT PLACEMENT;  Surgeon: Sheryl Bender Aloha Raddle., MD;  Location: [facility];  Service: Gastroenterology;;   POLYPECTOMY  12/29/2022   Procedure: POLYPECTOMY INTESTINAL;  Surgeon: Sheryl Bender Sheryl HERO, MD;  [facility];  Service: Endoscopy;;   STENT REMOVAL  10/08/2021   Procedure: CLEDA REMOVAL;  Surgeon: Sheryl Bender Aloha Raddle., MD;  Location: [facility];  Service: Gastroenterology;;   TIPS PROCEDURE N/A 02/09/2023   Procedure: TRANS-JUGULAR INTRAHEPATIC PORTAL SHUNT (TIPS);  Surgeon: Sheryl Bender Ester PARAS, MD;  Location: [facility] OR;  Service: Radiology;  Laterality: N/A;   TUBAL LIGATION  1974   WHIPPLE PROCEDURE N/A 02/15/2022   Procedure: ATTEMPTED WHIPPLE PROCEDURE;  Surgeon: Dasie Leonor CROME, MD;  Location: [facility] OR;  Service: General;  Laterality: N/A;    Social History: Social History   Socioeconomic History   Marital status: Widowed    Spouse name: Not on file   Number of children: Not on file   Years of education: Not on file   Highest education level: Not on file  Occupational History   Not on file  Tobacco Use   Smoking status: Never    Smokeless tobacco: Never  Vaping Use   Vaping status: Never Used  Substance and Sexual Activity   Alcohol  use: No   Drug use: Never   Sexual activity: Not on file  Other Topics Concern   Not on file  Social History Narrative   Not on file   Social Drivers of Health   Financial Resource Strain: Not on file  Food Insecurity: No Food Insecurity (02/18/2023)   Hunger Vital Sign    Worried About Running Out of Food in the Last Year: Never true    Ran Out of Food in the Last Year: Never true  Transportation Needs: No Transportation Needs (02/18/2023)   PRAPARE - Administrator, Civil Service (Medical): No    Lack of Transportation (Non-Medical): No  Physical Activity: Not on file  Stress: Not on file  Social Connections: Moderately Integrated (02/18/2023)   Social Connection and Isolation Panel [NHANES]    Frequency of Communication with Friends and Family: More than three times a week    Frequency of Social Gatherings with Friends and Family: Three times a week    Attends Religious Services: More than 4 times per year    Active Member of Clubs or Organizations: Yes    Attends Banker Meetings: More than 4 times per year    Marital Status: Widowed  Intimate Partner Violence: Not At Risk (02/18/2023)   Humiliation, Afraid, Rape, and Kick questionnaire    Fear of Current or Ex-Partner: No    Emotionally Abused: No  Physically Abused: No    Sexually Abused: No    Family History: Family History  Problem Relation Age of Onset   Pancreatitis Brother        alcoholic pancreatitis   Pancreatic cancer Neg Hx    Colon cancer Neg Hx     Current Medications:  Current Outpatient Medications:    acetaminophen  (TYLENOL ) 500 MG tablet, Take 2 tablets (1,000 mg total) by mouth every 8 (eight) hours as needed for moderate pain, headache or fever. (Patient taking differently: Take 750-1,000 mg by mouth every 8 (eight) hours as needed for moderate pain (pain score  4-6), headache or fever.), Disp: , Rfl:    benzonatate  (TESSALON ) 100 MG capsule, Take 1 capsule (100 mg total) by mouth 3 (three) times daily as needed for cough., Disp: 20 capsule, Rfl: 0   buPROPion  (WELLBUTRIN  XL) 300 MG 24 hr tablet, Take 300 mg by mouth daily after breakfast., Disp: , Rfl:    busPIRone  (BUSPAR ) 10 MG tablet, Take 20 mg by mouth 2 (two) times daily., Disp: , Rfl:    Cholecalciferol  (VITAMIN D3) 125 MCG (5000 UT) CAPS, Take 10,000 Units by mouth every morning., Disp: , Rfl:    escitalopram  (LEXAPRO ) 20 MG tablet, Take 20 mg by mouth at bedtime., Disp: , Rfl:    lactose free nutrition (BOOST) LIQD, Take 237 mLs by mouth 2 (two) times daily between meals., Disp: , Rfl:    lidocaine -prilocaine  (EMLA ) cream, Apply a small amount to port a cath site (do not rub in) and cover with plastic wrap 1 hour prior to infusion appointments, Disp: 30 g, Rfl: 3   LINZESS  72 MCG capsule, TAKE 1 CAPSULE BY MOUTH DAILY BEFORE BREAKFAST (Patient taking differently: Take 72 mcg by mouth daily before breakfast.), Disp: 30 capsule, Rfl: 0   mirtazapine  (REMERON ) 30 MG tablet, Take 15 mg by mouth at bedtime., Disp: , Rfl:    ondansetron  (ZOFRAN ) 4 MG tablet, Take 1 tablet (4 mg total) by mouth every 6 (six) hours as needed for nausea or vomiting., Disp: 30 tablet, Rfl: 0   pantoprazole  (PROTONIX ) 40 MG tablet, Take 1 tablet (40 mg total) by mouth 2 (two) times daily., Disp: 180 tablet, Rfl: 0   QUEtiapine  (SEROQUEL ) 25 MG tablet, Take 25 mg by mouth 2 (two) times daily., Disp: , Rfl:    sucralfate  (CARAFATE ) 1 g tablet, Take 1 tablet (1 g total) by mouth 4 (four) times daily -  with meals and at bedtime for 14 days., Disp: 56 tablet, Rfl: 0   Allergies: Allergies  Allergen Reactions   Other Hives and Other (See Comments)    Cherry wood just cut- smelled it and broke out; cannot tolerate ANY cherry fragrances, either      Cherry Hives   Wound Dressing Adhesive Rash and Other (See Comments)     Band-Aids = local reaction    REVIEW OF SYSTEMS:   Review of Systems  Constitutional:  Negative for chills, fatigue and fever.  HENT:   Negative for lump/mass, mouth sores, nosebleeds, sore throat and trouble swallowing.   Eyes:  Negative for eye problems.  Respiratory:  Positive for cough and shortness of breath.   Cardiovascular:  Positive for palpitations. Negative for chest pain and leg swelling.  Gastrointestinal:  Positive for abdominal distention (causing 3/10 abdominal pain) and nausea. Negative for abdominal pain, constipation, diarrhea and vomiting.  Genitourinary:  Positive for dysuria. Negative for bladder incontinence, difficulty urinating, frequency, hematuria and nocturia.   Musculoskeletal:  Negative for arthralgias, back pain, flank pain, myalgias and neck pain.  Skin:  Negative for itching and rash.  Neurological:  Positive for numbness (in feet and ankles). Negative for dizziness and headaches.  Hematological:  Does not bruise/bleed easily.  Psychiatric/Behavioral:  Positive for sleep disturbance. Negative for depression and suicidal ideas. The patient is nervous/anxious.   All other systems reviewed and are negative.    VITALS:   Blood pressure 116/64, pulse 99, temperature (!) 97 F (36.1 C), temperature source Tympanic, resp. rate 18, weight 136 lb 11 oz (62 kg), SpO2 97%.  Wt Readings from Last 3 Encounters:  03/03/23 136 lb 11 oz (62 kg)  03/01/23 147 lb 0.8 oz (66.7 kg)  02/09/23 130 lb (59 kg)    Body mass index is 24.6 kg/m.  Performance status (ECOG): 1 - Symptomatic but completely ambulatory  PHYSICAL EXAM:   Physical Exam Vitals and nursing note reviewed. Exam conducted with a chaperone present.  Constitutional:      Appearance: Normal appearance.  Cardiovascular:     Rate and Rhythm: Normal rate and regular rhythm.     Pulses: Normal pulses.     Heart sounds: Normal heart sounds.  Pulmonary:     Effort: Pulmonary effort is normal.      Breath sounds: Normal breath sounds.  Abdominal:     General: There is distension.     Palpations: Abdomen is soft. There is no hepatomegaly, splenomegaly or mass.     Tenderness: There is no abdominal tenderness.  Musculoskeletal:     Right lower leg: No edema.     Left lower leg: No edema.  Lymphadenopathy:     Cervical: No cervical adenopathy.     Right cervical: No superficial, deep or posterior cervical adenopathy.    Left cervical: No superficial, deep or posterior cervical adenopathy.     Upper Body:     Right upper body: No supraclavicular or axillary adenopathy.     Left upper body: No supraclavicular or axillary adenopathy.  Neurological:     General: No focal deficit present.     Mental Status: She is alert and oriented to person, place, and time.  Psychiatric:        Mood and Affect: Mood normal.        Behavior: Behavior normal.     LABS:      Latest Ref Rng & Units 03/03/2023    9:39 AM 02/27/2023    4:08 AM 02/26/2023    3:47 AM  CBC  WBC 4.0 - 10.5 K/uL 4.5  3.2  4.5   Hemoglobin 12.0 - 15.0 g/dL 8.4  7.1  7.9   Hematocrit 36.0 - 46.0 % 26.4  22.7  25.1   Platelets 150 - 400 K/uL 246  194  227       Latest Ref Rng & Units 02/27/2023    4:08 AM 02/25/2023   12:16 AM 02/23/2023    3:11 AM  CMP  Glucose 70 - 99 mg/dL 891  847  886   BUN 8 - 23 mg/dL 9  10  6    Creatinine 0.44 - 1.00 mg/dL 9.10  9.13  9.12   Sodium 135 - 145 mmol/L 141  135  139   Potassium 3.5 - 5.1 mmol/L 3.9  3.0  3.9   Chloride 98 - 111 mmol/L 114  111  112   CO2 22 - 32 mmol/L 22  20  19    Calcium  8.9 - 10.3 mg/dL 8.3  7.7  8.2   Total Protein 6.5 - 8.1 g/dL  4.4  4.8   Total Bilirubin 0.0 - 1.2 mg/dL  0.3  0.4   Alkaline Phos 38 - 126 U/L  75  83   AST 15 - 41 U/L  21  20   ALT 0 - 44 U/L  20  21      No results found for: CEA1, CEA / No results found for: CEA1, CEA No results found for: PSA1 Lab Results  Component Value Date   CAN199 226 (H) 02/01/2023   No results  found for: CAN125  No results found for: STEPHANY CARLOTA BENSON MARKEL EARLA JOANNIE, GAMS, MSPIKE, SPEI Lab Results  Component Value Date   TIBC 242 (L) 03/03/2023   TIBC 210 (L) 02/24/2023   TIBC 308 01/13/2023   FERRITIN 59 03/03/2023   FERRITIN 112 02/24/2023   FERRITIN 11 12/28/2022   IRONPCTSAT 12 03/03/2023   IRONPCTSAT 11 02/24/2023   IRONPCTSAT 14 01/13/2023   Lab Results  Component Value Date   LDH 116 12/28/2022     STUDIES:   CT ANGIO ABD/PELVIS BRTO Result Date: 02/25/2023 CLINICAL DATA:  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 Pancreatic cancer. * Tracking Code: BO * EXAM: CTA ABDOMEN AND PELVIS WITHOUT AND WITH CONTRAST TECHNIQUE: Multidetector CT imaging of the abdomen and pelvis was performed using the standard protocol during bolus administration of intravenous contrast. Multiplanar reconstructed images and MIPs were obtained and reviewed to evaluate the vascular anatomy. RADIATION DOSE REDUCTION: This exam was performed according to the departmental dose-optimization program which includes automated exposure control, adjustment of the mA and/or kV according to patient size and/or use of iterative reconstruction technique. CONTRAST:  OMNIPAQUE  IOHEXOL  350 MG/ML SOLN COMPARISON:  MRI 02/17/2023.  CT 02/06/2023. FINDINGS: VASCULAR Aorta: Thoracic aorta has a normal course and caliber with mild atherosclerotic calcified plaque. Celiac: Distal celiac is obscured by the streak artifact from the adjacent stent placement. The origin of the vessel has some minimal calcified plaque. Splenic artery and hepatic arteries are patent more distally. There intact branches of the distal left gastric. SMA: Patent without evidence of aneurysm, dissection, vasculitis or significant stenosis. Renals: Both renal arteries are patent without evidence of aneurysm, dissection, vasculitis, fibromuscular dysplasia or significant  stenosis. IMA: Patent without evidence of aneurysm, dissection, vasculitis or significant stenosis. Inflow: Mild calcified plaque along the iliac vessels. Proximal Outflow: Slight calcified plaque along the common femoral and SFA vessels included in the imaging field. Veins: Preserved IVC. There has been placement of a tips shunt extending from the IVC just above the liver through the middle hepatic vein and then into the main portal vein. The tips shunt is occluded along its course. Is also thrombosis of the portal vein. This includes the central portions of the left and right portal vein as well as the main portal vein. 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 There are some secondary and tertiary branches of the SMV which are patent. Review of the MIP images confirms the above findings. NON-VASCULAR Lower chest: Developing small bilateral pleural effusions with adjacent opacities. New small pericardial effusion. Hepatobiliary: Slightly heterogeneous appearing liver. Small benign-appearing hepatic cystic foci identified. Pancreas: Severe pancreatic atrophy with severe ductal dilatation in the neck, body and tail with mass lesion again identified at the head neck region. Mass estimated in size at 3.1 by 2.0 cm. This involves the portal venous confluence. Is also involvement of the course of the  GDA. Small bubble of air within a SMV branch. This could be postprocedural. Spleen: Spleen is actually with 2 separate components, nonspecific. Please correlate for any history of remote injury or other process. Adrenals/Urinary Tract: Stable adrenal glands. Parapelvic renal cysts identified. Ureters have normal course and caliber extending down to the urinary bladder. Preserved contour to the urinary bladder. Stomach/Bowel: On this non oral contrast exam large bowel is normal course and caliber. There is some scattered stool. Small bowel is nondilated. Stomach is underdistended but there is continued wall thickening and loss of  normal fold pattern along the body antrum of the stomach. There is also some wall thickening along the proximal duodenal areas. High density intraluminal linear focus within the distal stomach. Of note there is some wall thickening along the distal esophagus with small hiatal hernia and some varices. Lymphatic: No specific abnormal lymph node enlargement in the abdomen and pelvis. Few prominent porta hepatic nodes are identified. Reproductive: Uterus and bilateral adnexa are unremarkable. Other: Developing mild-to-moderate ascites. Musculoskeletal: Significant streak artifact from the spinal fixation hardware. Moderate multilevel degenerative changes along the spine. Scattered degenerative changes of the pelvis. IMPRESSION: Placement of a tips shunt. The shunt is occluded. Additional stents in the area of the portal venous confluence. There is also occlusion of the port main portal vein and central mid right left polar veins. There continues to be occlusion of the SMV and first order branches. Secondary and tertiary branches of the SMV appear to be open. There is some air within a branch of the SMV along the left side centrally which could be postprocedure related. Developing mild varices. Known pancreatic neoplasm with involvement of the portal venous confluence. Developing mild-to-moderate ascites, mesenteric stranding. No free intra-air. Developing bilateral pleural effusions adjacent opacities and a small pericardial effusion. Persistent wall thickening along the stomach and proximal duodenum with loss of fold pattern. No dilatation Electronically Signed   By: Ranell Bring M.D.   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 EXAM: 1. Ultrasound-guided vascular access to the left internal jugular vein. 2. Selective catheterization and venography of the inferior mesenteric vein, superior mesenteric vein, and splenic veins via the indwelling TIPS 3. Mechanical and aspiration thrombectomy of the TIPS, main portal vein, and central superior mesenteric, inferior mesenteric, and splenic veins. 4. Balloon angioplasty of the central superior mesenteric vein and splenic vein. MEDICATIONS: 8000 units heparin , intravenous ANESTHESIA/SEDATION: Moderate (conscious) sedation was employed during this procedure. A total of Versed  1 mg and Fentanyl  200 mcg was administered intravenously. Moderate Sedation Time: 72 minutes. The patient's level of consciousness and vital signs were monitored continuously by radiology nursing throughout the procedure under my direct supervision. CONTRAST:  65 mL Omnipaque  300, intravenous FLUOROSCOPY TIME:  One hundred ninety mGy COMPLICATIONS: None immediate. PROCEDURE: Informed written consent was obtained from the patient after a thorough discussion of the procedural risks, benefits and alternatives. All questions were addressed. Maximal Sterile Barrier Technique was utilized including caps, mask, sterile gowns, sterile gloves, sterile drape, hand hygiene and skin antiseptic. A timeout was performed prior to the initiation of the procedure. Preprocedure ultrasound evaluation demonstrated patency of the left internal jugular vein. The procedure was planned. Subdermal Local  anesthesia was administered with 1% lidocaine  at the planned needle entry site. A small skin nick was made. Under direct ultrasound visualization, the left internal jugular vein was accessed with a 21 gauge micropuncture needle. A permanent ultrasound image was captured and stored in the record. A micropuncture sheath was introduced through which a Rosen wire was directed to the  inferior vena cava. A 6 French destination sheath was then inserted and positioned in the right atrium. And in the a catheter was then directed to the hepatic vein ostium of the indwelling TIPS which was cannulated with moderate difficulty. A Rosen wire was then inserted and advanced level of the inferior mesenteric vein. Anatomy cross catheter was then placed over the wire and directed into the mid main inferior mesenteric vein. The wire was removed and inferior mesenteric venogram was performed which min straight it hepatofugal flow with occlusion of the inferior mesenteric vein centrally. The 6 French sheath was exchanged for a 16 French, 33 cm dry seal sheath. The sheath was advanced to the hepatic vein aspect of the indwelling endograft. Mechanical thrombectomy was then performed with the Revcore device through the central inferior mesenteric vein, main portal vein, and indwelling endograft. This was followed by aspiration thrombectomy with a straight 16 French flowtriever. There was small volume of chronic appearing thrombus aspirated. Balloon angioplasty was then performed throughout the TIPS and into the main portal vein with a 10 mm x 80 mm Athletis balloon. Portal venogram was then performed which demonstrated an established channel a patency through the main portal vein and into the inferior mesenteric vein. A small aspect of the central superior mesenteric vein is identified. The Navicross catheter and Glidewire intra then used to cannulate the superior mesenteric vein. Mechanical thrombectomy was performed with the Revcore device through the central superior mesenteric vein into the main portal vein. Balloon angioplasty was then performed with a 6 mm x 80 mm Athletis balloon. Super mesenteric venogram was then performed which demonstrated significantly improved patency and hepatopetal flow through the superior mesenteric vein, main portal vein, and indwelling endograft. There is no evidence of reflux  into the splenic vein. Therefore, the splenic vein was then cannulated and balloon angioplasty was performed with a 10 mm x 80 mm Athletis balloon. There is a type least near the portal confluence. Completion splenic venogram demonstrated 6 improved patency inflow the splenic vein however still subchronic synechia centrally. There was a filling defect about the hepatic vein aspect of the indwelling endograft. Therefore, additional balloon angioplasty was performed centered about the hepatic vein aspect of the indwelling endograft. Completion venogram demonstrated significantly improved patency and antegrade flow through the indwelling endograft main portal vein. 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 The catheter and sheath were removed. Hemostasis was achieved with brief manual compression. A sterile bandage was applied. The patient tolerated the procedure well was transferred back to the floor in good condition. IMPRESSION: 1. Successful mechanical and aspiration thrombectomy of the TIPS, main portal vein, and central superior mesenteric, inferior mesenteric, and splenic veins. 2. Balloon angioplasty of the central superior mesenteric vein, splenic vein, and hepatic vein aspect of the indwelling endograft. 3. Completion venogram demonstrated significantly improved patency and antegrade flow through the indwelling endograft, main portal vein, and central superior mesenteric vein. 4. 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 PLAN: Recommend restarting systemic anticoagulation to preserve portal and TIPS patency. Interventional radiology will follow. Ester Sides, MD [facility] Radiology Electronically Signed  By: Ester Sides M.D.   On: 02/23/2023 21:22   IR US  Guide Vasc Access Left Result Date: 02/23/2023 CLINICAL DATA:  78 year old female with history of pancreatic cancer status post radiation with development of progressive acute  portal vein thrombus and recurrent gastric hemorrhage status post portal thrombectomy and TIPS placement on 02/09/2023 presenting with recurrent gastric bleeding and imaging evidence of TIPS occlusion. EXAM: 1. Ultrasound-guided vascular access to the left internal jugular vein. 2. Selective catheterization and venography of the inferior mesenteric vein, superior mesenteric vein, and splenic veins via the indwelling TIPS 3. Mechanical and aspiration thrombectomy of the TIPS, main portal vein, and central superior mesenteric, inferior mesenteric, and splenic veins. 4. Balloon angioplasty of the central superior mesenteric vein and splenic vein. MEDICATIONS: 8000 units heparin , intravenous ANESTHESIA/SEDATION: Moderate (conscious) sedation was employed during this procedure. A total of Versed  1 mg and Fentanyl  200 mcg was administered intravenously. Moderate Sedation Time: 72 minutes. The patient's level of consciousness and vital signs were monitored continuously by radiology nursing throughout the procedure under my direct supervision. CONTRAST:  65 mL Omnipaque  300, intravenous FLUOROSCOPY TIME:  One hundred ninety mGy COMPLICATIONS: None immediate. PROCEDURE: Informed written consent was obtained from the patient after a thorough discussion of the procedural risks, benefits and alternatives. All questions were addressed. Maximal Sterile Barrier Technique was utilized including caps, mask, sterile gowns, sterile gloves, sterile drape, hand hygiene and skin antiseptic. A timeout was performed prior to the initiation of the procedure. Preprocedure ultrasound evaluation demonstrated patency of the left internal jugular vein. The procedure was planned. Subdermal Local anesthesia was administered with 1% lidocaine  at the planned needle entry site. A small skin nick was made. Under direct ultrasound visualization, the left internal jugular vein was accessed with a 21 gauge micropuncture needle. A permanent ultrasound  image was captured and stored in the record. A micropuncture sheath was introduced through which a Rosen wire was directed to the inferior vena cava. A 6 French destination sheath was then inserted and positioned in the right atrium. And in the a catheter was then directed to the hepatic vein ostium of the indwelling TIPS which was cannulated with moderate difficulty. A Rosen wire was then inserted and advanced level of the inferior mesenteric vein. Anatomy cross catheter was then placed over the wire and directed into the mid main inferior mesenteric vein. The wire was removed and inferior mesenteric venogram was performed which min straight it hepatofugal flow with occlusion of the inferior mesenteric vein centrally. The 6 French sheath was exchanged for a 16 French, 33 cm dry seal sheath. The sheath was advanced to the hepatic vein aspect of the indwelling endograft. Mechanical thrombectomy was then performed with the Revcore device through the central inferior mesenteric vein, main portal vein, and indwelling endograft. This was followed by aspiration thrombectomy with a straight 16 French flowtriever. There was small volume of chronic appearing thrombus aspirated. Balloon angioplasty was then performed throughout the TIPS and into the main portal vein with a 10 mm x 80 mm Athletis balloon. Portal venogram was then performed which demonstrated an established channel a patency through the main portal vein and into the inferior mesenteric vein. A small aspect of the central superior mesenteric vein is identified. The Navicross catheter and Glidewire intra then used to cannulate the superior mesenteric vein. Mechanical thrombectomy was performed with the Revcore device through the central superior mesenteric vein into the main portal vein. Balloon angioplasty was then performed with a 6 mm x 80  mm Athletis balloon. Super mesenteric venogram was then performed which demonstrated significantly improved patency and  hepatopetal flow through the superior mesenteric vein, main portal vein, and indwelling endograft. There is no evidence of reflux into the splenic vein. Therefore, the splenic vein was then cannulated and balloon angioplasty was performed with a 10 mm x 80 mm Athletis balloon. There is a type least near the portal confluence. Completion splenic venogram demonstrated 6 improved patency inflow the splenic vein however still subchronic synechia centrally. There was a filling defect about the hepatic vein aspect of the indwelling endograft. Therefore, additional balloon angioplasty was performed centered about the hepatic vein aspect of the indwelling endograft. Completion venogram demonstrated significantly improved patency and antegrade flow through the indwelling endograft main portal vein. 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 The catheter and sheath were removed. Hemostasis was achieved with brief manual compression. A sterile bandage was applied. The patient tolerated the procedure well was transferred back to the floor in good condition. IMPRESSION: 1. Successful mechanical and aspiration thrombectomy of the TIPS, main portal vein, and central superior mesenteric, inferior mesenteric, and splenic veins. 2. Balloon angioplasty of the central superior mesenteric vein, splenic vein, and hepatic vein aspect of the indwelling endograft. 3. Completion venogram demonstrated significantly improved patency and antegrade flow through the indwelling endograft, main portal vein, and central superior mesenteric vein. 4. 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 PLAN: Recommend restarting systemic anticoagulation to preserve portal and TIPS patency. Interventional radiology will follow. Ester Sides, MD [facility] Radiology Electronically Signed   By: Ester Sides M.D.   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 EXAM: 1. Ultrasound-guided vascular access to the left internal jugular vein. 2. Selective catheterization and venography of the inferior mesenteric vein, superior mesenteric vein, and splenic veins via the indwelling TIPS 3. Mechanical and aspiration thrombectomy of the TIPS, main portal vein, and central superior mesenteric, inferior mesenteric, and splenic veins. 4. Balloon angioplasty of the central superior mesenteric vein and splenic vein. MEDICATIONS: 8000 units heparin , intravenous ANESTHESIA/SEDATION: Moderate (conscious) sedation was employed during this procedure. A total of Versed  1 mg and Fentanyl  200 mcg was administered intravenously. Moderate Sedation Time: 72 minutes. The patient's level of consciousness and vital signs were monitored continuously by radiology nursing throughout the procedure under my direct supervision. CONTRAST:  65 mL Omnipaque  300, intravenous FLUOROSCOPY TIME:  One hundred ninety mGy COMPLICATIONS: None immediate. PROCEDURE: Informed written consent was obtained from the patient after a thorough discussion of the procedural risks, benefits and alternatives. All questions were addressed. Maximal Sterile Barrier Technique was utilized including caps, mask, sterile gowns, sterile gloves, sterile drape, hand hygiene and skin antiseptic. A timeout was performed prior to the initiation of the procedure. Preprocedure ultrasound evaluation demonstrated patency of the left internal jugular vein. The procedure was planned. Subdermal Local anesthesia was administered with 1% lidocaine  at the planned needle entry site. A small skin nick was made. Under direct ultrasound  visualization, the left internal jugular vein was accessed with a 21 gauge micropuncture needle. A permanent ultrasound image was captured and stored in the record. A micropuncture sheath was introduced through which a Corporate investment banker  was directed to the inferior vena cava. A 6 French destination sheath was then inserted and positioned in the right atrium. And in the a catheter was then directed to the hepatic vein ostium of the indwelling TIPS which was cannulated with moderate difficulty. A Rosen wire was then inserted and advanced level of the inferior mesenteric vein. Anatomy cross catheter was then placed over the wire and directed into the mid main inferior mesenteric vein. The wire was removed and inferior mesenteric venogram was performed which min straight it hepatofugal flow with occlusion of the inferior mesenteric vein centrally. The 6 French sheath was exchanged for a 16 French, 33 cm dry seal sheath. The sheath was advanced to the hepatic vein aspect of the indwelling endograft. Mechanical thrombectomy was then performed with the Revcore device through the central inferior mesenteric vein, main portal vein, and indwelling endograft. This was followed by aspiration thrombectomy with a straight 16 French flowtriever. There was small volume of chronic appearing thrombus aspirated. Balloon angioplasty was then performed throughout the TIPS and into the main portal vein with a 10 mm x 80 mm Athletis balloon. Portal venogram was then performed which demonstrated an established channel a patency through the main portal vein and into the inferior mesenteric vein. A small aspect of the central superior mesenteric vein is identified. The Navicross catheter and Glidewire intra then used to cannulate the superior mesenteric vein. Mechanical thrombectomy was performed with the Revcore device through the central superior mesenteric vein into the main portal vein. Balloon angioplasty was then performed with a 6 mm x 80 mm  Athletis balloon. Super mesenteric venogram was then performed which demonstrated significantly improved patency and hepatopetal flow through the superior mesenteric vein, main portal vein, and indwelling endograft. There is no evidence of reflux into the splenic vein. Therefore, the splenic vein was then cannulated and balloon angioplasty was performed with a 10 mm x 80 mm Athletis balloon. There is a type least near the portal confluence. Completion splenic venogram demonstrated 6 improved patency inflow the splenic vein however still subchronic synechia centrally. There was a filling defect about the hepatic vein aspect of the indwelling endograft. Therefore, additional balloon angioplasty was performed centered about the hepatic vein aspect of the indwelling endograft. Completion venogram demonstrated significantly improved patency and antegrade flow through the indwelling endograft main portal vein. 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 The catheter and sheath were removed. Hemostasis was achieved with brief manual compression. A sterile bandage was applied. The patient tolerated the procedure well was transferred back to the floor in good condition. IMPRESSION: 1. Successful mechanical and aspiration thrombectomy of the TIPS, main portal vein, and central superior mesenteric, inferior mesenteric, and splenic veins. 2. Balloon angioplasty of the central superior mesenteric vein, splenic vein, and hepatic vein aspect of the indwelling endograft. 3. Completion venogram demonstrated significantly improved patency and antegrade flow through the indwelling endograft, main portal vein, and central superior mesenteric vein. 4. 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 PLAN: Recommend restarting systemic anticoagulation to preserve portal and TIPS patency. Interventional radiology will follow. Ester Sides, MD [facility] Radiology Electronically  Signed   By: Ester Sides M.D.   On: 02/23/2023 21:22   DG Chest Port 1 View Result Date: 02/18/2023 CLINICAL DATA:  Shortness of breath EXAM: PORTABLE CHEST 1 VIEW COMPARISON:  07/13/2021 FINDINGS: Cardiac shadow is stable. Right chest wall port is now seen in satisfactory position. The lungs are well aerated bilaterally. Patchy  left perihilar opacity is noted as well as some left retrocardiac opacity most consistent with multifocal pneumonia. Minimal changes in the lateral aspect of the right base are noted as well. No bony abnormality is seen. IMPRESSION: Patchy areas of increased parenchymal opacity bilaterally consistent with multifocal pneumonia. Electronically Signed   By: Oneil Devonshire M.D.   On: 02/18/2023 21:47   MR Abdomen W Wo Contrast Result Date: 02/18/2023 CLINICAL DATA:  Pancreatic cancer monitoring EXAM: MRI ABDOMEN WITHOUT AND WITH CONTRAST TECHNIQUE: Multiplanar multisequence MR imaging of the abdomen was performed both before and after the administration of intravenous contrast. CONTRAST:  6mL GADAVIST  GADOBUTROL  1 MMOL/ML IV SOLN COMPARISON:  CT abdomen pelvis, 02/06/2023 FINDINGS: Lower chest: Small bilateral pleural effusions. Small hiatal hernia. Hepatobiliary: No suspicious liver lesions. Hepatic signal inversion on in and opposed phase imaging, consistent with hepatic iron deposition. No gallstones, gallbladder wall thickening, or biliary dilatation. Pancreas: No significant change in an ill-defined, infiltrative soft tissue mass of the central pancreatic head measuring 2.8 x 1.9 cm (series 4, image 20). Parenchymal atrophy and ductal dilatation of the distal pancreas, duct measuring up to 1.2 cm (series 4, image 18). Spleen: Normal in size. Accessory spleen in the left upper quadrant (series 3, image 17) Adrenals/Urinary Tract: Adrenal glands are unremarkable. Numerous bilateral parapelvic renal cysts, benign, requiring no further follow-up or characterization. Stomach/Bowel: Stomach  is within normal limits. No evidence of bowel wall thickening, distention, or inflammatory changes. Vascular/Lymphatic: Aortic atherosclerosis. 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 No enlarged abdominal lymph nodes. Other: No abdominal wall hernia or abnormality. Small volume ascites, increased compared to prior examination. Musculoskeletal: No acute or significant osseous findings. IMPRESSION: 1. No significant change in an ill-defined, infiltrative soft tissue mass of the central pancreatic head measuring 2.8 x 1.9 cm, consistent with pancreatic adenocarcinoma. 2. 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 3. Small volume ascites, increased compared to prior examination. 4. Small bilateral pleural effusions. 5. Hepatic iron deposition. Electronically Signed   By: Marolyn JONETTA Jaksch M.D.   On: 02/18/2023 14:47   MR 3D Recon At Scanner Result Date: 02/18/2023 CLINICAL DATA:  Pancreatic cancer monitoring EXAM: MRI ABDOMEN WITHOUT AND WITH CONTRAST TECHNIQUE: Multiplanar multisequence MR imaging of the abdomen was performed both before and after the administration of intravenous contrast. CONTRAST:  6mL GADAVIST  GADOBUTROL  1 MMOL/ML IV SOLN COMPARISON:  CT abdomen pelvis, 02/06/2023 FINDINGS: Lower chest: Small bilateral pleural effusions. Small hiatal hernia. Hepatobiliary: No suspicious liver lesions. Hepatic signal inversion on in and opposed phase imaging, consistent with hepatic iron deposition. No gallstones, gallbladder wall thickening, or biliary dilatation. Pancreas: No significant change in an ill-defined, infiltrative soft tissue mass of the central pancreatic head measuring 2.8 x 1.9 cm (series 4, image 20). Parenchymal atrophy and ductal dilatation of the distal pancreas, duct measuring up to 1.2 cm (series 4,  image 18). Spleen: Normal in size. Accessory spleen in the left upper quadrant (series 3, image 17) Adrenals/Urinary Tract: Adrenal glands are unremarkable. Numerous bilateral parapelvic renal cysts, benign, requiring no further follow-up or characterization. Stomach/Bowel: Stomach is within normal limits. No evidence of bowel wall thickening, distention, or inflammatory changes. Vascular/Lymphatic: Aortic atherosclerosis. 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 No enlarged abdominal lymph  nodes. Other: No abdominal wall hernia or abnormality. Small volume ascites, increased compared to prior examination. Musculoskeletal: No acute or significant osseous findings. IMPRESSION: 1. No significant change in an ill-defined, infiltrative soft tissue mass of the central pancreatic head measuring 2.8 x 1.9 cm, consistent with pancreatic adenocarcinoma. 2. 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 3. Small volume ascites, increased compared to prior examination. 4. Small bilateral pleural effusions. 5. Hepatic iron deposition. Electronically Signed   By: Marolyn JONETTA Jaksch M.D.   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 EXAM: 1. Ultrasound-guided access of the right internal jugular vein 2. Ultrasound-guided access of the right greater saphenous vein 3. Hepatic venogram 4. Intravascular ultrasound 5. Catheterization of the portal vein 6. Portal venogram 7. Creation of a transhepatic portal vein to hepatic vein shunt 8. Aspiration thrombectomy of the portal vein and superior mesenteric vein 9. Mechanical thrombectomy of the portal vein and  superior mesenteric vein 10. Drug coated balloon angioplasty of the main portal vein and superior mesenteric vein 11. Embolization of left gastric vein MEDICATIONS: As antibiotic prophylaxis, Rocephin  1 gm IV was ordered pre-procedure and administered intravenously within one hour of incision. ANESTHESIA/SEDATION: General - as administered by the Anesthesia department CONTRAST:  One hundred ML Omnipaque  300, intravenous FLUOROSCOPY TIME:  2,580 mGy COMPLICATIONS: None immediate. PROCEDURE: The procedure was performed in concert with my partner Dr. Gordy Roulette. Informed written consent was obtained from the patient after a thorough discussion of the procedural risks, benefits and alternatives. All questions were addressed. Maximal Sterile Barrier Technique was utilized including caps, mask, sterile gowns, sterile gloves, sterile drape, hand hygiene and skin antiseptic. A timeout was performed prior to the initiation of the procedure. A preliminary ultrasound of the right groin was performed and demonstrates a patent right common femoral vein and central greater saphenous vein. A permanent ultrasound image was recorded. Using a combination of fluoroscopy and ultrasound, an access site was determined. A small dermatotomy was made at the planned puncture site. Using ultrasound guidance, access into the right greater saphenous vein was obtained with visualization of needle entry into the vessel using a standard micropuncture technique. A wire was advanced into the IVC insert all fascial dilation performed. An 8 French, 11 cm vascular sheath was placed into the external iliac vein. Through this access site, an 72 French Accunav ICE catheter was advanced with ease under fluoroscopic guidance to the level of the intrahepatic inferior vena cava. A preliminary ultrasound of the right neck was performed and demonstrates a patent internal jugular vein. A permanent ultrasound image was recorded. Using a combination of fluoroscopy  and ultrasound, an access site was determined. A small dermatotomy was made at the planned puncture site. Using ultrasound guidance, access into the right internal jugular vein was obtained with visualization of needle entry into the vessel using a standard micropuncture technique. A wire was advanced into the IVC and serial fascial dilation performed. A 10 French tips sheath was placed into the internal jugular vein and advanced to the IVC. The jugular sheath was retracted into the right atrium and manometry was performed measuring a mean pressure of 11 mmHg. A 5 French angled tip catheter was then directed into the right hepatic vein. Hepatic venogram was performed. These images demonstrated patent hepatic  vein with no stenosis. The catheter was advanced to a wedge portion of the a patent vein over which the 10 French sheath was advanced into the right hepatic vein. Using ICE ultrasound visualization the catheter as right hepatic vein as well as the portal anatomy was defined. There was acute appearing, mildly expansile heterogeneously hypoechoic thrombus throughout the portal veins. A planned exit site from the hepatic vein and puncture site from the portal vein was placed into a single sonographic plane. Under direct ultrasound visualization, the ScorpionX needle was advanced into the central right portal vein. A Glidewire Advantage was then advanced however coursed laterally in appeared to follow and extrahepatic location. Upon manipulating to preserve portal venous access, hepatic vein access was lost with the base sheath. Therefore, the jugular sheath was then retracted into the right atrium and a 5 French angled tip catheter was directed into the middle hepatic vein. Hepatic venogram was performed. These images demonstrated patent hepatic vein with no stenosis. The catheter was advanced to a wedged portion of the patent vein over which the 10 French sheath was advanced into the middle hepatic vein. Using ICE  ultrasound visualization a planned exit site from the hepatic vein and portal puncture site were placed into a single sonographic plane. Under direct ultrasound visualization, the ScorpionX needle was advanced into the central left portal vein. There is moderate redundancy in looping of the wire in the left portal vein prior to entering the main portal vein which required several manipulation techniques to reduce including balloon dilation and insertion of a stiff buddy wire. A 5 French marking pigtail catheter was then advanced over the wire into the superior mesenteric vein and wire removed. Portal venogram was performed which demonstrated occlusive thrombus throughout the central superior mesenteric vein and main portal vein. The tract was then dilated to 8 mm with an 8 mm x 8 cm Athletis balloon. A 8-10 mm by 7 + 2 cm of Viatorr endograft was placed. This was dilated to 8 mm. The indwelling 10 French sheath was then exchanged for a 16 French, 33 cm dry seal sheath. The sheath was directed to the portal aspect of the endograft. Aspiration thrombectomy was then performed in multiple passes over the wire through the main portal vein and into the central superior mesenteric vein. Moderate acute and chronic appearing thrombus were collected in the aspiration canister. A sample was sent for pathology. Repeat portal venogram demonstrated persistent occlusive appearing thrombus in the main portal vein and irregular luminal filling defects in the central superior mesenteric vein. Therefore, balloon angioplasty was performed with a 6 mm x 8 cm Athletis balloon. Repeat portal venogram demonstrated no evidence of extravasation in mildly restored luminal gain in the central superior mesenteric vein. This region was then treated with a 6 mm x 8 cm In.Pact drug coated balloon prolonged inflation for total of 3 minutes. After angioplasty, the central superior mesenteric vein appear patent with inline flow into the indwelling  tips endograft. Additional balloon angioplasty through the tips endograft within 8 mm balloon was performed. Repeat portal venogram demonstrated minimal improved patency. Additional aspiration thrombectomy was performed through the tips endograft which yielded moderate acute appearing thrombus. Repeat portal venogram demonstrated persistent near occlusive thrombus throughout the indwelling tips endograft in main portal vein. Therefore, mechanical thrombectomy was performed through the tips endograft and main portal vein into the central superior mesenteric vein with a 6 French cleaner device undergoing multiple passes. Repeat portal venogram demonstrated improved patency and inline flow  via the indwelling tips, however with multiple irregular luminal filling defects and sluggish flow. Retrograde flow was noted into the left gastric vein which filled multiple small gastroesophageal varices. The indwelling tips endograft was then dilated with a 10 mm x 80 mm Athletis balloon. Repeat portal venogram demonstrated improved patency and antegrade flow through the indwelling endograft. There is persistent retrograde filling of the left gastric vein. Therefore, a 5 French C2 catheter was used to select the left gastric vein. Left gastric venogram demonstrated retrograde flow with opacification and irregular vascularity about multifocal small gastroesophageal varices. Therefore, coil embolization was performed about the central left gastric vein with 2, 6 mm 0.035 Azur detachable coils. Completion portal venogram demonstrated successfully embolized left gastric vein with antegrade flow via patent main portal vein in newly placed TIPS endograft, however several scattered filling defects remain in the main portal vein and in the endograft. The catheters and sheath were removed and manual compression was applied to the right internal jugular and right greater saphenous venous access sites until hemostasis was achieved. The  patient was administered 60 mg of subcutaneous Lovenox . The patient was transferred to the PACU in stable condition. IMPRESSION: 1. 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 2. 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 3. Technically successful aspiration and mechanical thrombectomy of the superior mesenteric and main portal veins. 4. Technically successful plain and drug coated balloon angioplasty of the superior mesenteric and main portal veins. 5. Technically successful coil embolization of the left gastric vein. PLAN: Begin therapeutic Lovenox  for 1 month prior to transitioning back to oral anticoagulation. IR will follow while inpatient and arrange outpatient followup within 1 month after discharge. Ester Sides, MD [facility] Radiology Electronically Signed   By: Ester Sides M.D.   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 EXAM: 1. Ultrasound-guided access of the right internal jugular vein 2. Ultrasound-guided access of the right greater saphenous vein 3. Hepatic venogram 4. Intravascular ultrasound 5. Catheterization of the portal vein 6. Portal venogram 7. Creation of a transhepatic portal vein to hepatic vein shunt 8. Aspiration thrombectomy of the portal vein and superior mesenteric vein 9. Mechanical thrombectomy of the portal vein and superior mesenteric vein 10. Drug coated balloon angioplasty of the main portal vein and superior mesenteric vein 11. Embolization of left gastric vein MEDICATIONS: As antibiotic prophylaxis, Rocephin  1 gm IV was ordered pre-procedure and administered intravenously within one  hour of incision. ANESTHESIA/SEDATION: General - as administered by the Anesthesia department CONTRAST:  One hundred ML Omnipaque  300, intravenous FLUOROSCOPY TIME:  2,580 mGy COMPLICATIONS: None immediate. PROCEDURE: The procedure was performed in concert with my partner Dr. Gordy Roulette. Informed written consent was obtained from the patient after a thorough discussion of the procedural risks, benefits and alternatives. All questions were addressed. Maximal Sterile Barrier Technique was utilized including caps, mask, sterile gowns, sterile gloves, sterile drape, hand hygiene and skin antiseptic. A timeout was performed prior to the initiation of the procedure. A preliminary ultrasound of the right groin was performed and demonstrates a patent right common femoral vein and central greater saphenous vein. A permanent ultrasound image was recorded. Using a combination of fluoroscopy and ultrasound, an access site was determined. A small  dermatotomy was made at the planned puncture site. Using ultrasound guidance, access into the right greater saphenous vein was obtained with visualization of needle entry into the vessel using a standard micropuncture technique. A wire was advanced into the IVC insert all fascial dilation performed. An 8 French, 11 cm vascular sheath was placed into the external iliac vein. Through this access site, an 11 French Accunav ICE catheter was advanced with ease under fluoroscopic guidance to the level of the intrahepatic inferior vena cava. A preliminary ultrasound of the right neck was performed and demonstrates a patent internal jugular vein. A permanent ultrasound image was recorded. Using a combination of fluoroscopy and ultrasound, an access site was determined. A small dermatotomy was made at the planned puncture site. Using ultrasound guidance, access into the right internal jugular vein was obtained with visualization of needle entry into the vessel using a standard micropuncture  technique. A wire was advanced into the IVC and serial fascial dilation performed. A 10 French tips sheath was placed into the internal jugular vein and advanced to the IVC. The jugular sheath was retracted into the right atrium and manometry was performed measuring a mean pressure of 11 mmHg. A 5 French angled tip catheter was then directed into the right hepatic vein. Hepatic venogram was performed. These images demonstrated patent hepatic vein with no stenosis. The catheter was advanced to a wedge portion of the a patent vein over which the 10 French sheath was advanced into the right hepatic vein. Using ICE ultrasound visualization the catheter as right hepatic vein as well as the portal anatomy was defined. There was acute appearing, mildly expansile heterogeneously hypoechoic thrombus throughout the portal veins. A planned exit site from the hepatic vein and puncture site from the portal vein was placed into a single sonographic plane. Under direct ultrasound visualization, the ScorpionX needle was advanced into the central right portal vein. A Glidewire Advantage was then advanced however coursed laterally in appeared to follow and extrahepatic location. Upon manipulating to preserve portal venous access, hepatic vein access was lost with the base sheath. Therefore, the jugular sheath was then retracted into the right atrium and a 5 French angled tip catheter was directed into the middle hepatic vein. Hepatic venogram was performed. These images demonstrated patent hepatic vein with no stenosis. The catheter was advanced to a wedged portion of the patent vein over which the 10 French sheath was advanced into the middle hepatic vein. Using ICE ultrasound visualization a planned exit site from the hepatic vein and portal puncture site were placed into a single sonographic plane. Under direct ultrasound visualization, the ScorpionX needle was advanced into the central left portal vein. There is moderate  redundancy in looping of the wire in the left portal vein prior to entering the main portal vein which required several manipulation techniques to reduce including balloon dilation and insertion of a stiff buddy wire. A 5 French marking pigtail catheter was then advanced over the wire into the superior mesenteric vein and wire removed. Portal venogram was performed which demonstrated occlusive thrombus throughout the central superior mesenteric vein and main portal vein. The tract was then dilated to 8 mm with an 8 mm x 8 cm Athletis balloon. A 8-10 mm by 7 + 2 cm of Viatorr endograft was placed. This was dilated to 8 mm. The indwelling 10 French sheath was then exchanged for a 16 French, 33 cm dry seal sheath. The sheath was directed to the portal aspect of the endograft. Aspiration  thrombectomy was then performed in multiple passes over the wire through the main portal vein and into the central superior mesenteric vein. Moderate acute and chronic appearing thrombus were collected in the aspiration canister. A sample was sent for pathology. Repeat portal venogram demonstrated persistent occlusive appearing thrombus in the main portal vein and irregular luminal filling defects in the central superior mesenteric vein. Therefore, balloon angioplasty was performed with a 6 mm x 8 cm Athletis balloon. Repeat portal venogram demonstrated no evidence of extravasation in mildly restored luminal gain in the central superior mesenteric vein. This region was then treated with a 6 mm x 8 cm In.Pact drug coated balloon prolonged inflation for total of 3 minutes. After angioplasty, the central superior mesenteric vein appear patent with inline flow into the indwelling tips endograft. Additional balloon angioplasty through the tips endograft within 8 mm balloon was performed. Repeat portal venogram demonstrated minimal improved patency. Additional aspiration thrombectomy was performed through the tips endograft which yielded  moderate acute appearing thrombus. Repeat portal venogram demonstrated persistent near occlusive thrombus throughout the indwelling tips endograft in main portal vein. Therefore, mechanical thrombectomy was performed through the tips endograft and main portal vein into the central superior mesenteric vein with a 6 French cleaner device undergoing multiple passes. Repeat portal venogram demonstrated improved patency and inline flow via the indwelling tips, however with multiple irregular luminal filling defects and sluggish flow. Retrograde flow was noted into the left gastric vein which filled multiple small gastroesophageal varices. The indwelling tips endograft was then dilated with a 10 mm x 80 mm Athletis balloon. Repeat portal venogram demonstrated improved patency and antegrade flow through the indwelling endograft. There is persistent retrograde filling of the left gastric vein. Therefore, a 5 French C2 catheter was used to select the left gastric vein. Left gastric venogram demonstrated retrograde flow with opacification and irregular vascularity about multifocal small gastroesophageal varices. Therefore, coil embolization was performed about the central left gastric vein with 2, 6 mm 0.035 Azur detachable coils. Completion portal venogram demonstrated successfully embolized left gastric vein with antegrade flow via patent main portal vein in newly placed TIPS endograft, however several scattered filling defects remain in the main portal vein and in the endograft. The catheters and sheath were removed and manual compression was applied to the right internal jugular and right greater saphenous venous access sites until hemostasis was achieved. The patient was administered 60 mg of subcutaneous Lovenox . The patient was transferred to the PACU in stable condition. IMPRESSION: 1. 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 2. Extensive acute and subacute thrombus throughout the central  superior mesenteric vein, main portal vein, and bilateral intrahepatic portal veins. 3. Technically successful aspiration and mechanical thrombectomy of the superior mesenteric and main portal veins. 4. Technically successful plain and drug coated balloon angioplasty of the superior mesenteric and main portal veins. 5. Technically successful coil embolization of the left gastric vein. PLAN: Begin therapeutic Lovenox  for 1 month prior to transitioning back to oral anticoagulation. IR will follow while inpatient and arrange outpatient followup within 1 month after discharge. Ester Sides, MD [facility] Radiology Electronically Signed   By: Ester Sides M.D.   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 EXAM: 1. Ultrasound-guided access of the right internal jugular vein 2. Ultrasound-guided access of  the right greater saphenous vein 3. Hepatic venogram 4. Intravascular ultrasound 5. Catheterization of the portal vein 6. Portal venogram 7. Creation of a transhepatic portal vein to hepatic vein shunt 8. Aspiration thrombectomy of the portal vein and superior mesenteric vein 9. Mechanical thrombectomy of the portal vein and superior mesenteric vein 10. Drug coated balloon angioplasty of the main portal vein and superior mesenteric vein 11. Embolization of left gastric vein MEDICATIONS: As antibiotic prophylaxis, Rocephin  1 gm IV was ordered pre-procedure and administered intravenously within one hour of incision. ANESTHESIA/SEDATION: General - as administered by the Anesthesia department CONTRAST:  One hundred ML Omnipaque  300, intravenous FLUOROSCOPY TIME:  2,580 mGy COMPLICATIONS: None immediate. PROCEDURE: The procedure was performed in concert with my  partner Dr. Gordy Roulette. Informed written consent was obtained from the patient after a thorough discussion of the procedural risks, benefits and alternatives. All questions were addressed. Maximal Sterile Barrier Technique was utilized including caps, mask, sterile gowns, sterile gloves, sterile drape, hand hygiene and skin antiseptic. A timeout was performed prior to the initiation of the procedure. A preliminary ultrasound of the right groin was performed and demonstrates a patent right common femoral vein and central greater saphenous vein. A permanent ultrasound image was recorded. Using a combination of fluoroscopy and ultrasound, an access site was determined. A small dermatotomy was made at the planned puncture site. Using ultrasound guidance, access into the right greater saphenous vein was obtained with visualization of needle entry into the vessel using a standard micropuncture technique. A wire was advanced into the IVC insert all fascial dilation performed. An 8 French, 11 cm vascular sheath was placed into the external iliac vein. Through this access site, an 56 French Accunav ICE catheter was advanced with ease under fluoroscopic guidance to the level of the intrahepatic inferior vena cava. A preliminary ultrasound of the right neck was performed and demonstrates a patent internal jugular vein. A permanent ultrasound image was recorded. Using a combination of fluoroscopy and ultrasound, an access site was determined. A small dermatotomy was made at the planned puncture site. Using ultrasound guidance, access into the right internal jugular vein was obtained with visualization of needle entry into the vessel using a standard micropuncture technique. A wire was advanced into the IVC and serial fascial dilation performed. A 10 French tips sheath was placed into the internal jugular vein and advanced to the IVC. The jugular sheath was retracted into the right atrium and manometry was performed measuring a  mean pressure of 11 mmHg. A 5 French angled tip catheter was then directed into the right hepatic vein. Hepatic venogram was performed. These images demonstrated patent hepatic vein with no stenosis. The catheter was advanced to a wedge portion of the a patent vein over which the 10 French sheath was advanced into the right hepatic vein. Using ICE ultrasound visualization the catheter as right hepatic vein as well as the portal anatomy was defined. There was acute appearing, mildly expansile heterogeneously hypoechoic thrombus throughout the portal veins. A planned exit site from the hepatic vein and puncture site from the portal vein was placed into a single sonographic plane. Under direct ultrasound visualization, the ScorpionX needle was advanced into the central right portal vein. A Glidewire Advantage was then advanced however coursed laterally in appeared to follow and extrahepatic location. Upon manipulating to preserve portal venous access, hepatic vein access was lost with the base sheath. Therefore, the jugular sheath was then retracted into the right atrium and a 5 French angled  tip catheter was directed into the middle hepatic vein. Hepatic venogram was performed. These images demonstrated patent hepatic vein with no stenosis. The catheter was advanced to a wedged portion of the patent vein over which the 10 French sheath was advanced into the middle hepatic vein. Using ICE ultrasound visualization a planned exit site from the hepatic vein and portal puncture site were placed into a single sonographic plane. Under direct ultrasound visualization, the ScorpionX needle was advanced into the central left portal vein. There is moderate redundancy in looping of the wire in the left portal vein prior to entering the main portal vein which required several manipulation techniques to reduce including balloon dilation and insertion of a stiff buddy wire. A 5 French marking pigtail catheter was then advanced over  the wire into the superior mesenteric vein and wire removed. Portal venogram was performed which demonstrated occlusive thrombus throughout the central superior mesenteric vein and main portal vein. The tract was then dilated to 8 mm with an 8 mm x 8 cm Athletis balloon. A 8-10 mm by 7 + 2 cm of Viatorr endograft was placed. This was dilated to 8 mm. The indwelling 10 French sheath was then exchanged for a 16 French, 33 cm dry seal sheath. The sheath was directed to the portal aspect of the endograft. Aspiration thrombectomy was then performed in multiple passes over the wire through the main portal vein and into the central superior mesenteric vein. Moderate acute and chronic appearing thrombus were collected in the aspiration canister. A sample was sent for pathology. Repeat portal venogram demonstrated persistent occlusive appearing thrombus in the main portal vein and irregular luminal filling defects in the central superior mesenteric vein. Therefore, balloon angioplasty was performed with a 6 mm x 8 cm Athletis balloon. Repeat portal venogram demonstrated no evidence of extravasation in mildly restored luminal gain in the central superior mesenteric vein. This region was then treated with a 6 mm x 8 cm In.Pact drug coated balloon prolonged inflation for total of 3 minutes. After angioplasty, the central superior mesenteric vein appear patent with inline flow into the indwelling tips endograft. Additional balloon angioplasty through the tips endograft within 8 mm balloon was performed. Repeat portal venogram demonstrated minimal improved patency. Additional aspiration thrombectomy was performed through the tips endograft which yielded moderate acute appearing thrombus. Repeat portal venogram demonstrated persistent near occlusive thrombus throughout the indwelling tips endograft in main portal vein. Therefore, mechanical thrombectomy was performed through the tips endograft and main portal vein into the central  superior mesenteric vein with a 6 French cleaner device undergoing multiple passes. Repeat portal venogram demonstrated improved patency and inline flow via the indwelling tips, however with multiple irregular luminal filling defects and sluggish flow. Retrograde flow was noted into the left gastric vein which filled multiple small gastroesophageal varices. The indwelling tips endograft was then dilated with a 10 mm x 80 mm Athletis balloon. Repeat portal venogram demonstrated improved patency and antegrade flow through the indwelling endograft. There is persistent retrograde filling of the left gastric vein. Therefore, a 5 French C2 catheter was used to select the left gastric vein. Left gastric venogram demonstrated retrograde flow with opacification and irregular vascularity about multifocal small gastroesophageal varices. Therefore, coil embolization was performed about the central left gastric vein with 2, 6 mm 0.035 Azur detachable coils. Completion portal venogram demonstrated successfully embolized left gastric vein with antegrade flow via patent main portal vein in newly placed TIPS endograft, however several scattered filling defects remain in  the main portal vein and in the endograft. The catheters and sheath were removed and manual compression was applied to the right internal jugular and right greater saphenous venous access sites until hemostasis was achieved. The patient was administered 60 mg of subcutaneous Lovenox . The patient was transferred to the PACU in stable condition. IMPRESSION: 1. 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 2. 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 3. Technically successful aspiration and mechanical thrombectomy of the superior mesenteric and main portal veins. 4. Technically successful plain and drug coated balloon angioplasty of the superior  mesenteric and main portal veins. 5. Technically successful coil embolization of the left gastric vein. PLAN: Begin therapeutic Lovenox  for 1 month prior to transitioning back to oral anticoagulation. IR will follow while inpatient and arrange outpatient followup within 1 month after discharge. Ester Sides, MD [facility] Radiology Electronically Signed   By: Ester Sides M.D.   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 EXAM: 1. Ultrasound-guided access of the right internal jugular vein 2. Ultrasound-guided access of the right greater saphenous vein 3. Hepatic venogram 4. Intravascular ultrasound 5. Catheterization of the portal vein 6. Portal venogram 7. Creation of a transhepatic portal vein to hepatic vein shunt 8. Aspiration thrombectomy of the portal vein and superior mesenteric vein 9. Mechanical thrombectomy of the portal vein and superior mesenteric vein 10. Drug coated balloon angioplasty of the main portal vein and superior mesenteric vein 11. Embolization of left gastric vein MEDICATIONS: As antibiotic prophylaxis, Rocephin  1 gm IV was ordered pre-procedure and administered intravenously within one hour of incision. ANESTHESIA/SEDATION: General - as administered by the Anesthesia department CONTRAST:  One hundred ML Omnipaque  300, intravenous FLUOROSCOPY TIME:  2,580 mGy COMPLICATIONS: None immediate. PROCEDURE: The procedure was performed in concert with my partner Dr. Gordy Roulette. Informed written consent was obtained from the patient after a thorough discussion of the procedural risks, benefits and alternatives. All questions were addressed. Maximal Sterile Barrier Technique was utilized including caps, mask, sterile gowns, sterile  gloves, sterile drape, hand hygiene and skin antiseptic. A timeout was performed prior to the initiation of the procedure. A preliminary ultrasound of the right groin was performed and demonstrates a patent right common femoral vein and central greater saphenous vein. A permanent ultrasound image was recorded. Using a combination of fluoroscopy and ultrasound, an access site was determined. A small dermatotomy was made at the planned puncture site. Using ultrasound guidance, access into the right greater saphenous vein was obtained with visualization of needle entry into the vessel using a standard micropuncture technique. A wire was advanced into the IVC insert all fascial dilation performed. An 8 French, 11 cm vascular sheath was placed into the external iliac vein. Through this access site, an 37 French Accunav ICE catheter was advanced with ease under fluoroscopic guidance to the level of the intrahepatic inferior vena cava. A preliminary ultrasound of the right neck was performed and demonstrates a patent internal jugular vein. A permanent ultrasound image was recorded. Using a combination of fluoroscopy and ultrasound, an access site was determined. A small dermatotomy was made at the planned puncture site. Using ultrasound guidance, access into the right internal jugular vein was obtained with visualization of needle entry  into the vessel using a standard micropuncture technique. A wire was advanced into the IVC and serial fascial dilation performed. A 10 French tips sheath was placed into the internal jugular vein and advanced to the IVC. The jugular sheath was retracted into the right atrium and manometry was performed measuring a mean pressure of 11 mmHg. A 5 French angled tip catheter was then directed into the right hepatic vein. Hepatic venogram was performed. These images demonstrated patent hepatic vein with no stenosis. The catheter was advanced to a wedge portion of the a patent vein over which the  10 French sheath was advanced into the right hepatic vein. Using ICE ultrasound visualization the catheter as right hepatic vein as well as the portal anatomy was defined. There was acute appearing, mildly expansile heterogeneously hypoechoic thrombus throughout the portal veins. A planned exit site from the hepatic vein and puncture site from the portal vein was placed into a single sonographic plane. Under direct ultrasound visualization, the ScorpionX needle was advanced into the central right portal vein. A Glidewire Advantage was then advanced however coursed laterally in appeared to follow and extrahepatic location. Upon manipulating to preserve portal venous access, hepatic vein access was lost with the base sheath. Therefore, the jugular sheath was then retracted into the right atrium and a 5 French angled tip catheter was directed into the middle hepatic vein. Hepatic venogram was performed. These images demonstrated patent hepatic vein with no stenosis. The catheter was advanced to a wedged portion of the patent vein over which the 10 French sheath was advanced into the middle hepatic vein. Using ICE ultrasound visualization a planned exit site from the hepatic vein and portal puncture site were placed into a single sonographic plane. Under direct ultrasound visualization, the ScorpionX needle was advanced into the central left portal vein. There is moderate redundancy in looping of the wire in the left portal vein prior to entering the main portal vein which required several manipulation techniques to reduce including balloon dilation and insertion of a stiff buddy wire. A 5 French marking pigtail catheter was then advanced over the wire into the superior mesenteric vein and wire removed. Portal venogram was performed which demonstrated occlusive thrombus throughout the central superior mesenteric vein and main portal vein. The tract was then dilated to 8 mm with an 8 mm x 8 cm Athletis balloon. A 8-10 mm  by 7 + 2 cm of Viatorr endograft was placed. This was dilated to 8 mm. The indwelling 10 French sheath was then exchanged for a 16 French, 33 cm dry seal sheath. The sheath was directed to the portal aspect of the endograft. Aspiration thrombectomy was then performed in multiple passes over the wire through the main portal vein and into the central superior mesenteric vein. Moderate acute and chronic appearing thrombus were collected in the aspiration canister. A sample was sent for pathology. Repeat portal venogram demonstrated persistent occlusive appearing thrombus in the main portal vein and irregular luminal filling defects in the central superior mesenteric vein. Therefore, balloon angioplasty was performed with a 6 mm x 8 cm Athletis balloon. Repeat portal venogram demonstrated no evidence of extravasation in mildly restored luminal gain in the central superior mesenteric vein. This region was then treated with a 6 mm x 8 cm In.Pact drug coated balloon prolonged inflation for total of 3 minutes. After angioplasty, the central superior mesenteric vein appear patent with inline flow into the indwelling tips endograft. Additional balloon angioplasty through the tips endograft within 8  mm balloon was performed. Repeat portal venogram demonstrated minimal improved patency. Additional aspiration thrombectomy was performed through the tips endograft which yielded moderate acute appearing thrombus. Repeat portal venogram demonstrated persistent near occlusive thrombus throughout the indwelling tips endograft in main portal vein. Therefore, mechanical thrombectomy was performed through the tips endograft and main portal vein into the central superior mesenteric vein with a 6 French cleaner device undergoing multiple passes. Repeat portal venogram demonstrated improved patency and inline flow via the indwelling tips, however with multiple irregular luminal filling defects and sluggish flow. Retrograde flow was noted  into the left gastric vein which filled multiple small gastroesophageal varices. The indwelling tips endograft was then dilated with a 10 mm x 80 mm Athletis balloon. Repeat portal venogram demonstrated improved patency and antegrade flow through the indwelling endograft. There is persistent retrograde filling of the left gastric vein. Therefore, a 5 French C2 catheter was used to select the left gastric vein. Left gastric venogram demonstrated retrograde flow with opacification and irregular vascularity about multifocal small gastroesophageal varices. Therefore, coil embolization was performed about the central left gastric vein with 2, 6 mm 0.035 Azur detachable coils. Completion portal venogram demonstrated successfully embolized left gastric vein with antegrade flow via patent main portal vein in newly placed TIPS endograft, however several scattered filling defects remain in the main portal vein and in the endograft. The catheters and sheath were removed and manual compression was applied to the right internal jugular and right greater saphenous venous access sites until hemostasis was achieved. The patient was administered 60 mg of subcutaneous Lovenox . The patient was transferred to the PACU in stable condition. IMPRESSION: 1. 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 2. 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 3. Technically successful aspiration and mechanical thrombectomy of the superior mesenteric and main portal veins. 4. Technically successful plain and drug coated balloon angioplasty of the superior mesenteric and main portal veins. 5. Technically successful coil embolization of the left gastric vein. PLAN: Begin therapeutic Lovenox  for 1 month prior to transitioning back to oral anticoagulation. IR will follow while inpatient and arrange outpatient followup within 1 month  after discharge. Ester Sides, MD [facility] Radiology Electronically Signed   By: Ester Sides M.D.   On: 02/09/2023 22:10   IR EMBO VENOUS NOT HEMORR HEMANG  INC GUIDE ROADMAPPING Result Date: 02/09/2023 CLINICAL DATA:  78 year old female with history of pancreatic cancer status post radiation with development of progressive acute portal vein thrombus and recurrent gastric hemorrhage. EXAM: 1. Ultrasound-guided access of the right internal jugular vein 2. Ultrasound-guided access of the right greater saphenous vein 3. Hepatic venogram 4. Intravascular ultrasound 5. Catheterization of the portal vein 6. Portal venogram 7. Creation of a transhepatic portal vein to hepatic vein shunt 8. Aspiration thrombectomy of the portal vein and superior mesenteric vein 9. Mechanical thrombectomy of the portal vein and superior mesenteric vein 10. Drug coated balloon angioplasty of the main portal vein and superior mesenteric vein 11. Embolization of left gastric vein MEDICATIONS: As antibiotic prophylaxis, Rocephin  1 gm IV was ordered pre-procedure and administered intravenously within one hour of incision. ANESTHESIA/SEDATION: General - as administered by the Anesthesia department CONTRAST:  One hundred ML Omnipaque  300, intravenous FLUOROSCOPY TIME:  2,580 mGy COMPLICATIONS: None immediate. PROCEDURE: The procedure was performed in concert with my partner Dr. Gordy Roulette. Informed written consent was obtained from the patient after a thorough discussion  of the procedural risks, benefits and alternatives. All questions were addressed. Maximal Sterile Barrier Technique was utilized including caps, mask, sterile gowns, sterile gloves, sterile drape, hand hygiene and skin antiseptic. A timeout was performed prior to the initiation of the procedure. A preliminary ultrasound of the right groin was performed and demonstrates a patent right common femoral vein and central greater  saphenous vein. A permanent ultrasound image was recorded. Using a combination of fluoroscopy and ultrasound, an access site was determined. A small dermatotomy was made at the planned puncture site. Using ultrasound guidance, access into the right greater saphenous vein was obtained with visualization of needle entry into the vessel using a standard micropuncture technique. A wire was advanced into the IVC insert all fascial dilation performed. An 8 French, 11 cm vascular sheath was placed into the external iliac vein. Through this access site, an 46 French Accunav ICE catheter was advanced with ease under fluoroscopic guidance to the level of the intrahepatic inferior vena cava. A preliminary ultrasound of the right neck was performed and demonstrates a patent internal jugular vein. A permanent ultrasound image was recorded. Using a combination of fluoroscopy and ultrasound, an access site was determined. A small dermatotomy was made at the planned puncture site. Using ultrasound guidance, access into the right internal jugular vein was obtained with visualization of needle entry into the vessel using a standard micropuncture technique. A wire was advanced into the IVC and serial fascial dilation performed. A 10 French tips sheath was placed into the internal jugular vein and advanced to the IVC. The jugular sheath was retracted into the right atrium and manometry was performed measuring a mean pressure of 11 mmHg. A 5 French angled tip catheter was then directed into the right hepatic vein. Hepatic venogram was performed. These images demonstrated patent hepatic vein with no stenosis. The catheter was advanced to a wedge portion of the a patent vein over which the 10 French sheath was advanced into the right hepatic vein. Using ICE ultrasound visualization the catheter as right hepatic vein as well as the portal anatomy was defined. There was acute appearing, mildly expansile heterogeneously hypoechoic thrombus  throughout the portal veins. A planned exit site from the hepatic vein and puncture site from the portal vein was placed into a single sonographic plane. Under direct ultrasound visualization, the ScorpionX needle was advanced into the central right portal vein. A Glidewire Advantage was then advanced however coursed laterally in appeared to follow and extrahepatic location. Upon manipulating to preserve portal venous access, hepatic vein access was lost with the base sheath. Therefore, the jugular sheath was then retracted into the right atrium and a 5 French angled tip catheter was directed into the middle hepatic vein. Hepatic venogram was performed. These images demonstrated patent hepatic vein with no stenosis. The catheter was advanced to a wedged portion of the patent vein over which the 10 French sheath was advanced into the middle hepatic vein. Using ICE ultrasound visualization a planned exit site from the hepatic vein and portal puncture site were placed into a single sonographic plane. Under direct ultrasound visualization, the ScorpionX needle was advanced into the central left portal vein. There is moderate redundancy in looping of the wire in the left portal vein prior to entering the main portal vein which required several manipulation techniques to reduce including balloon dilation and insertion of a stiff buddy wire. A 5 French marking pigtail catheter was then advanced over the wire into the superior mesenteric vein and wire  removed. Portal venogram was performed which demonstrated occlusive thrombus throughout the central superior mesenteric vein and main portal vein. The tract was then dilated to 8 mm with an 8 mm x 8 cm Athletis balloon. A 8-10 mm by 7 + 2 cm of Viatorr endograft was placed. This was dilated to 8 mm. The indwelling 10 French sheath was then exchanged for a 16 French, 33 cm dry seal sheath. The sheath was directed to the portal aspect of the endograft. Aspiration thrombectomy  was then performed in multiple passes over the wire through the main portal vein and into the central superior mesenteric vein. Moderate acute and chronic appearing thrombus were collected in the aspiration canister. A sample was sent for pathology. Repeat portal venogram demonstrated persistent occlusive appearing thrombus in the main portal vein and irregular luminal filling defects in the central superior mesenteric vein. Therefore, balloon angioplasty was performed with a 6 mm x 8 cm Athletis balloon. Repeat portal venogram demonstrated no evidence of extravasation in mildly restored luminal gain in the central superior mesenteric vein. This region was then treated with a 6 mm x 8 cm In.Pact drug coated balloon prolonged inflation for total of 3 minutes. After angioplasty, the central superior mesenteric vein appear patent with inline flow into the indwelling tips endograft. Additional balloon angioplasty through the tips endograft within 8 mm balloon was performed. Repeat portal venogram demonstrated minimal improved patency. Additional aspiration thrombectomy was performed through the tips endograft which yielded moderate acute appearing thrombus. Repeat portal venogram demonstrated persistent near occlusive thrombus throughout the indwelling tips endograft in main portal vein. Therefore, mechanical thrombectomy was performed through the tips endograft and main portal vein into the central superior mesenteric vein with a 6 French cleaner device undergoing multiple passes. Repeat portal venogram demonstrated improved patency and inline flow via the indwelling tips, however with multiple irregular luminal filling defects and sluggish flow. Retrograde flow was noted into the left gastric vein which filled multiple small gastroesophageal varices. The indwelling tips endograft was then dilated with a 10 mm x 80 mm Athletis balloon. Repeat portal venogram demonstrated improved patency and antegrade flow through the  indwelling endograft. There is persistent retrograde filling of the left gastric vein. Therefore, a 5 French C2 catheter was used to select the left gastric vein. Left gastric venogram demonstrated retrograde flow with opacification and irregular vascularity about multifocal small gastroesophageal varices. Therefore, coil embolization was performed about the central left gastric vein with 2, 6 mm 0.035 Azur detachable coils. Completion portal venogram demonstrated successfully embolized left gastric vein with antegrade flow via patent main portal vein in newly placed TIPS endograft, however several scattered filling defects remain in the main portal vein and in the endograft. The catheters and sheath were removed and manual compression was applied to the right internal jugular and right greater saphenous venous access sites until hemostasis was achieved. The patient was administered 60 mg of subcutaneous Lovenox . The patient was transferred to the PACU in stable condition. IMPRESSION: 1. 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 2. 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 3. Technically successful aspiration and mechanical thrombectomy of the superior mesenteric and main portal veins. 4. Technically successful plain and drug coated balloon angioplasty of the superior mesenteric and main portal veins. 5. Technically successful coil embolization of the left gastric vein. PLAN: Begin therapeutic Lovenox  for 1 month prior to transitioning back to oral anticoagulation. IR will follow while  inpatient and arrange outpatient followup within 1 month after discharge. Ester Sides, MD [facility] Electronically Signed   By: Ester Sides M.D.   On: 02/09/2023 22:10   CT ABDOMEN PELVIS W CONTRAST Result Date: 02/06/2023 CLINICAL DATA:   Pancreatitis, acute, severe Patient presents with abdominal pain. Radiologic records indicates history of pancreatic cancer. EXAM: CT ABDOMEN AND PELVIS WITH CONTRAST TECHNIQUE: Multidetector CT imaging of the abdomen and pelvis was performed using the standard protocol following bolus administration of intravenous contrast. RADIATION DOSE REDUCTION: This exam was performed according to the departmental dose-optimization program which includes automated exposure control, adjustment of the mA and/or kV according to patient size and/or use of iterative reconstruction technique. CONTRAST:  75mL OMNIPAQUE  IOHEXOL  350 MG/ML SOLN COMPARISON:  CT 8 days ago.  PET CT 10/28/2022 reviewed FINDINGS: Lower chest: Again seen linear atelectasis or scarring in the left greater than right lower lobe. 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 No pleural effusion. Hepatobiliary: Tiny hypodensity in the right lobe of the liver is unchanged series 3, image 9. No new intrahepatic abnormality. There is progressive portal vein thrombus, increasing volume of intrahepatic portal vein thrombus. The gallbladder is not well seen. Common bile duct is poorly defined on the current exam. Pancreas: Pancreatic atrophy with ductal dilatation, unchanged over the last 8 days. The known hypodense pancreatic mass is grossly unchanged measuring 2.2 cm series 3, image 23. This is less well-defined than on prior exam. Lesion abuts the SMV with progressive SMV thrombus. Peripancreatic fat stranding about the pancreatic head is minor. Spleen: Bilobed appearance of the spleen. No focal splenic abnormality. Adrenals/Urinary Tract: No adrenal nodule. Bilateral parapelvic cysts. No further follow-up imaging is recommended. No hydronephrosis or renal inflammation. Moderate bladder distension, no wall thickening. Stomach/Bowel: Detailed bowel assessment is limited in the absence of enteric contrast. Paraesophageal varices. Wall thickening about the distal  stomach, reference series 3, image 21. Again seen wall thickening of the duodenum with adjacent stranding, for example series 3, image 25. No small bowel obstruction or additional small bowel wall thickening. No small bowel pneumatosis. There is wall thickening about the hepatic flexure of the colon series 3, image 26, increased from prior exam. Transverse colon is redundant. Small to moderate colonic stool burden. Vascular/Lymphatic: Progressive thrombus within the superior mesenteric vein which is now occlusive. Progressive thrombus within the main and intrahepatic portal veins. The splenic vein remains patent. Increasing portosystemic collaterals. Aortic atherosclerosis. No bulky adenopathy. Reproductive: Nonacute. Other: Right upper quadrant fat stranding which is adjacent to gastric, duodenal and colonic wall thickening. No frank ascites. No free air. Right lateral lumbar hernia contains only fat. Musculoskeletal: Posterior rod with intrapedicular screw fusion L2 through L5. Additional multilevel degenerative change in the spine. No evidence of focal bone lesion. IMPRESSION: 1. Progressive thrombus within the superior mesenteric vein over the last 8 days, now occlusive. Progressive thrombus within the main and intrahepatic portal veins. 2. Known hypodense pancreatic mass is grossly unchanged measuring 2.2 cm. This is less well-defined than on prior exam. 3. Peripancreatic fat stranding is mild. 4. Persistent and progressive wall thickening about the distal stomach, duodenum, and hepatic flexure of the colon. Progressive right upper quadrant fat stranding. Etiology of bowel wall thickening and inflammation is indeterminate. 5. Increasing portosystemic collaterals. Paraesophageal varices. Aortic Atherosclerosis (ICD10-I70.0). Electronically Signed   By: Hayzel Gasman M.D.   On: 02/06/2023 17:21

## 2023-03-04 ENCOUNTER — Ambulatory Visit
Admission: RE | Admit: 2023-03-04 | Discharge: 2023-03-04 | Disposition: A | Discharge status: Home / Self Care | Payer: Medicare Other | Source: Ambulatory Visit | Attending: Interventional Radiology

## 2023-03-04 ENCOUNTER — Encounter (HOSPITAL_COMMUNITY): Payer: Self-pay

## 2023-03-04 ENCOUNTER — Ambulatory Visit (HOSPITAL_COMMUNITY)
Admission: RE | Admit: 2023-03-04 | Discharge: 2023-03-04 | Disposition: A | Discharge status: Home / Self Care | Payer: Medicare Other | Source: Ambulatory Visit | Attending: Hematology | Admitting: Hematology

## 2023-03-04 ENCOUNTER — Other Ambulatory Visit: Payer: Self-pay | Admitting: Interventional Radiology

## 2023-03-04 DIAGNOSIS — R188 Other ascites: Secondary | ICD-10-CM | POA: Insufficient documentation

## 2023-03-04 DIAGNOSIS — Z8507 Personal history of malignant neoplasm of pancreas: Secondary | ICD-10-CM

## 2023-03-04 HISTORY — PX: IR RADIOLOGIST EVAL & MGMT: IMG5224

## 2023-03-04 MED ORDER — ALBUMIN HUMAN 25 % IV SOLN
50.0000 g | Freq: Once | INTRAVENOUS | Status: AC
  Administered 2023-03-04: 50 g via INTRAVENOUS

## 2023-03-04 MED ORDER — ALBUMIN HUMAN 25 % IV SOLN
INTRAVENOUS | Status: AC
  Filled 2023-03-04: qty 150

## 2023-03-04 MED ORDER — ALBUMIN HUMAN 25 % IV SOLN
INTRAVENOUS | Status: AC
  Filled 2023-03-04: qty 50

## 2023-03-04 NOTE — Progress Notes (Signed)
 Patient tolerated right sided paracentesis and 50G of IV albumin  well today and 1.5 Liters of pink colored ascites removed. Patient verbalized understanding of discharge instructions and left via wheelchair with no acute distress noted.

## 2023-03-04 NOTE — Progress Notes (Signed)
 Referring Physician(s): Dr. 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  She underwent TIPS creation and portal thrombectomy with left gastric vein embolization on 02/09/23 followed shortly thereafter with TIPS and portal thrombectomy due to recurrent thrombus and gastric hemorrhage.  This unforunately was noted to nearly immediately thrombose on follow up CT 48 hrs later.  She was discharged home on 03/01/23.  She had a paracentesis today.  She is now on palliative care.    Past Medical History:  Diagnosis Date   Anxiety    Arthritis    Depression    Dysrhythmia    hx palpitations greater than 5 yrs -neg echo, stress per pt ? where or dr   GERD (gastroesophageal reflux disease)    occ   Nausea & vomiting 08/24/2021   Pancreatic adenocarcinoma (HCC)    Pancreatic pseudocyst    Port-A-Cath in place 09/15/2021   Pulmonary embolism [facility])    September 2022    Past Surgical History:  Procedure Laterality Date   ANTERIOR LAT LUMBAR FUSION Right 12/16/2015   Procedure: RIGHT LUMBAR TWO-THREE, LUMBAR THREE-FOUR, LUMBAR FOUR-FIVE ANTEROLATERAL LUMBAR INTERBODY FUSION;  Surgeon: Fairy Levels, MD;  Location: [facility] OR;  Service: Neurosurgery;  Laterality: Right;   BALLOON DILATION N/A 08/27/2021   Procedure: BALLOON DILATION;  Surgeon: Wilhelmenia Aloha Raddle., MD;  Location: [facility];  Service: Gastroenterology;  Laterality: N/A;   BIOPSY  07/09/2021   Procedure: BIOPSY;  Surgeon: Wilhelmenia Aloha Raddle., MD;  Location: [facility] ENDOSCOPY;  Service: Gastroenterology;;   COLONOSCOPY WITH PROPOFOL  N/A 12/29/2022   Procedure: COLONOSCOPY WITH PROPOFOL ;  Surgeon:  Shaaron Lamar HERO, MD;  [facility];  Service: Endoscopy;  Laterality: N/A;   CYST GASTROSTOMY  08/27/2021   Procedure: CYST GASTROSTOMY;  Surgeon: Wilhelmenia Aloha Raddle., MD;  Location: [facility] ENDOSCOPY;  Service: Gastroenterology;;   ESOPHAGOGASTRODUODENOSCOPY N/A 07/09/2021   Procedure: ESOPHAGOGASTRODUODENOSCOPY (EGD);  Surgeon: Wilhelmenia Aloha Raddle., MD;  Location: [facility] ENDOSCOPY;  Service: Gastroenterology;  Laterality: N/A;   ESOPHAGOGASTRODUODENOSCOPY N/A 02/07/2023   Procedure: ESOPHAGOGASTRODUODENOSCOPY (EGD);  Surgeon: Federico Rosario BROCKS, MD;  Location: [facility] ENDOSCOPY;  Service: Gastroenterology;  Laterality: N/A;   ESOPHAGOGASTRODUODENOSCOPY (EGD) WITH PROPOFOL  N/A 08/27/2021   Procedure: ESOPHAGOGASTRODUODENOSCOPY (EGD) WITH PROPOFOL ;  Surgeon: Wilhelmenia Aloha Raddle., MD;  Location: [facility]OS[facility];  Service: Gastroenterology;  Laterality: N/A;   ESOPHAGOGASTRODUODENOSCOPY (EGD) WITH PROPOFOL  N/A 10/08/2021   Procedure: ESOPHAGOGASTRODUODENOSCOPY (EGD) WITH PROPOFOL ;  Surgeon: Wilhelmenia Aloha Raddle., MD;  Location: [facility];  Service: Gastroenterology;  Laterality: N/A;   ESOPHAGOGASTRODUODENOSCOPY (EGD) WITH PROPOFOL  N/A 12/29/2022   Procedure: ESOPHAGOGASTRODUODENOSCOPY (EGD) WITH PROPOFOL ;  Surgeon: Shaaron Lamar HERO, MD;  [facility];  Service: Endoscopy;  Laterality: N/A;   ESOPHAGOGASTRODUODENOSCOPY (EGD) WITH PROPOFOL  N/A 01/13/2023   Procedure: ESOPHAGOGASTRODUODENOSCOPY (EGD) WITH PROPOFOL ;  Surgeon: Cinderella Deatrice FALCON, MD;  [facility];  Service: Endoscopy;  Laterality: N/A;   ESOPHAGOGASTRODUODENOSCOPY (EGD) WITH PROPOFOL  N/A 01/30/2023   Procedure: ESOPHAGOGASTRODUODENOSCOPY (EGD) WITH PROPOFOL ;  Surgeon: Cindie Carlin POUR, DO;  [facility];  Service: Endoscopy;  Laterality: N/A;   ESOPHAGOGASTRODUODENOSCOPY (EGD) WITH PROPOFOL  N/A 02/18/2023   Procedure: ESOPHAGOGASTRODUODENOSCOPY (EGD) WITH PROPOFOL ;  Surgeon: Charlanne Groom, MD;  Location: [facility]  ENDOSCOPY;  Service: Gastroenterology;  Laterality: N/A;   EUS N/A 07/09/2021   Procedure: UPPER ENDOSCOPIC ULTRASOUND (EUS) RADIAL;  Surgeon: Wilhelmenia Aloha Raddle., MD;  Location: [facility] ENDOSCOPY;  Service: Gastroenterology;  Laterality: N/A;   EUS N/A 08/27/2021   Procedure: UPPER ENDOSCOPIC ULTRASOUND (EUS) LINEAR;  Surgeon: Wilhelmenia Aloha Raddle., MD;  Location: [facility] ENDOSCOPY;  Service: Gastroenterology;  Laterality: N/A;   FINE NEEDLE ASPIRATION  07/09/2021   Procedure: FINE NEEDLE ASPIRATION (FNA) LINEAR;  Surgeon: Wilhelmenia Aloha Raddle., MD;  Location: [facility] ENDOSCOPY;  Service: Gastroenterology;;   HEMOSTASIS CLIP PLACEMENT  12/29/2022   Procedure: HEMOSTASIS CLIP PLACEMENT;  Surgeon: Shaaron Lamar HERO, MD;  [facility];  Service: Endoscopy;;   HEMOSTASIS CLIP PLACEMENT  02/18/2023   Procedure: HEMOSTASIS CLIP PLACEMENT;  Surgeon: Charlanne Groom, MD;  Location: [facility] ENDOSCOPY;  Service: Gastroenterology;;   HEMOSTASIS CONTROL  02/18/2023   Procedure: HEMOSTASIS CONTROL;  Surgeon: Charlanne Groom, MD;  Location: [facility] ENDOSCOPY;  Service: Gastroenterology;;   HOT HEMOSTASIS  12/29/2022   Procedure: HOT HEMOSTASIS (ARGON PLASMA COAGULATION/BICAP);  Surgeon: Shaaron Lamar HERO, MD;  [facility];  Service: Endoscopy;;   HOT HEMOSTASIS  01/13/2023   Procedure: HOT HEMOSTASIS (ARGON PLASMA COAGULATION/BICAP);  Surgeon: Cinderella Deatrice FALCON, MD;  [facility];  Service: Endoscopy;;  used apc and goldprobe   HOT HEMOSTASIS  01/30/2023   Procedure: HOT HEMOSTASIS (ARGON PLASMA COAGULATION/BICAP);  Surgeon: Cindie Carlin POUR, DO;  [facility];  Service: Endoscopy;;   HOT HEMOSTASIS N/A 02/07/2023   Procedure: HOT HEMOSTASIS (ARGON PLASMA COAGULATION/BICAP);  Surgeon: Federico Rosario BROCKS, MD;  Location: [facility] ENDOSCOPY;  Service: Gastroenterology;  Laterality: N/A;   HOT HEMOSTASIS N/A 02/18/2023   Procedure: HOT HEMOSTASIS (ARGON PLASMA COAGULATION/BICAP);  Surgeon: Charlanne Groom, MD;   Location: [facility] ENDOSCOPY;  Service: Gastroenterology;  Laterality: N/A;   IR EMBO VENOUS NOT HEMORR HEMANG  INC GUIDE ROADMAPPING  02/09/2023   IR IMAGING GUIDED PORT INSERTION  09/14/2021   IR THROMBECT SEC MECH MOD SED  02/23/2023   IR THROMBECT VENO MECH MOD SED  02/09/2023   IR TIPS  02/09/2023   IR TIPS REVISION MOD SED  02/23/2023   IR US  GUIDE VASC ACCESS LEFT  02/23/2023   IR US  GUIDE VASC ACCESS RIGHT  02/09/2023   IR US  GUIDE VASC ACCESS RIGHT  02/09/2023   LAPAROSCOPY N/A 02/15/2022   Procedure: STAGING DIAGNOSTIC;  Surgeon: Dasie Leonor CROME, MD;  Location: [facility] OR;  Service: General;  Laterality: N/A;   LUMBAR PERCUTANEOUS PEDICLE SCREW 3 LEVEL Bilateral 12/16/2015   Procedure: PERCUTANEOUS PEDICLE SCREWS BILATERALLY AT LUMBAR TWO-FIVE;  Surgeon: Fairy Levels, MD;  Location: [facility] OR;  Service: Neurosurgery;  Laterality: Bilateral;   PANCREATIC STENT PLACEMENT  08/27/2021   Procedure: PANCREATIC STENT PLACEMENT;  Surgeon: Wilhelmenia Aloha Raddle., MD;  Location: [facility];  Service: Gastroenterology;;   POLYPECTOMY  12/29/2022   Procedure: POLYPECTOMY INTESTINAL;  Surgeon: Shaaron Lamar HERO, MD;  [facility];  Service: Endoscopy;;   STENT REMOVAL  10/08/2021   Procedure: CLEDA REMOVAL;  Surgeon: Wilhelmenia Aloha Raddle., MD;  Location: [facility];  Service: Gastroenterology;;   TIPS PROCEDURE N/A 02/09/2023   Procedure: TRANS-JUGULAR INTRAHEPATIC PORTAL SHUNT (TIPS);  Surgeon: Jennefer Ester PARAS, MD;  Location: [facility] OR;  Service: Radiology;  Laterality: N/A;   TUBAL LIGATION  1974   WHIPPLE PROCEDURE N/A 02/15/2022   Procedure: ATTEMPTED WHIPPLE PROCEDURE;  Surgeon: Dasie Leonor CROME, MD;  Location: [facility] OR;  Service: General;  Laterality: N/A;  Allergies: Other, Cherry, and Wound dressing adhesive  Medications: Prior to Admission medications   Medication Sig Start Date End Date Taking? Authorizing Provider  acetaminophen  (TYLENOL ) 500 MG tablet Take 2 tablets (1,000 mg total) by mouth  every 8 (eight) hours as needed for moderate pain, headache or fever. Patient taking differently: Take 750-1,000 mg by mouth every 8 (eight) hours as needed for moderate pain (pain score 4-6), headache or fever. 07/21/21   Ricky Fines, MD  benzonatate  (TESSALON ) 100 MG capsule Take 1 capsule (100 mg total) by mouth 3 (three) times daily as needed for cough. 03/01/23   Gonfa, Taye T, MD  buPROPion  (WELLBUTRIN  XL) 300 MG 24 hr tablet Take 300 mg by mouth daily after breakfast. 06/15/21   [provider]  busPIRone  (BUSPAR ) 10 MG tablet Take 20 mg by mouth 2 (two) times daily. 01/27/21   [provider]  Cholecalciferol  (VITAMIN D3) 125 MCG (5000 UT) CAPS Take 10,000 Units by mouth every morning.    [provider]  escitalopram  (LEXAPRO ) 20 MG tablet Take 20 mg by mouth at bedtime.    [provider]  lactose free nutrition (BOOST) LIQD Take 237 mLs by mouth 2 (two) times daily between meals.    [provider]  lidocaine -prilocaine  (EMLA ) cream Apply a small amount to port a cath site (do not rub in) and cover with plastic wrap 1 hour prior to infusion appointments 09/15/21   Rogers Hai, MD  LINZESS  72 MCG capsule TAKE 1 CAPSULE BY MOUTH DAILY BEFORE BREAKFAST Patient taking differently: Take 72 mcg by mouth daily before breakfast. 01/20/23   Mansouraty, Aloha Raddle., MD  mirtazapine  (REMERON ) 30 MG tablet Take 15 mg by mouth at bedtime.    [provider]  ondansetron  (ZOFRAN ) 4 MG tablet Take 1 tablet (4 mg total) by mouth every 6 (six) hours as needed for nausea or vomiting. 02/02/23 03/04/23  Laurence Locus, DO  pantoprazole  (PROTONIX ) 40 MG tablet Take 1 tablet (40 mg total) by mouth 2 (two) times daily. 02/02/23 05/03/23  Laurence Locus, DO  QUEtiapine  (SEROQUEL ) 25 MG tablet Take 25 mg by mouth 2 (two) times daily. 12/14/22   [provider]  sucralfate  (CARAFATE ) 1 g tablet Take 1 tablet (1 g total) by mouth 4 (four) times daily -  with meals  and at bedtime for 14 days. 03/01/23 03/15/23  Gonfa, Taye T, MD     Family History  Problem Relation Age of Onset   Pancreatitis Brother        alcoholic pancreatitis   Pancreatic cancer Neg Hx    Colon cancer Neg Hx     Social History   Socioeconomic History   Marital status: Widowed    Spouse name: Not on file   Number of children: Not on file   Years of education: Not on file   Highest education level: Not on file  Occupational History   Not on file  Tobacco Use   Smoking status: Never   Smokeless tobacco: Never  Vaping Use   Vaping status: Never Used  Substance and Sexual Activity   Alcohol  use: No   Drug use: Never   Sexual activity: Not on file  Other Topics Concern   Not on file  Social History Narrative   Not on file   Social Drivers of Health   Financial Resource Strain: Not on file  Food Insecurity: No Food Insecurity (02/18/2023)   Hunger Vital Sign    Worried About Running Out  of Food in the Last Year: Never true    Ran Out of Food in the Last Year: Never true  Transportation Needs: No Transportation Needs (02/18/2023)   PRAPARE - Administrator, Civil Service (Medical): No    Lack of Transportation (Non-Medical): No  Physical Activity: Not on file  Stress: Not on file  Social Connections: Moderately Integrated (02/18/2023)   Social Connection and Isolation Panel [NHANES]    Frequency of Communication with Friends and Family: More than three times a week    Frequency of Social Gatherings with Friends and Family: Three times a week    Attends Religious Services: More than 4 times per year    Active Member of Clubs or Organizations: Yes    Attends Banker Meetings: More than 4 times per year    Marital Status: Widowed     Vital Signs: There were no vitals taken for this visit.  No physical examination was performed in lieu of virtual telephone clinic visit.   Imaging: US  Paracentesis Result Date: 03/04/2023 INDICATION: 78  y.o. Female. History of pancreatic adenocarcinoma. Found to have ascites. Request is for therapeutic paracentesis. EXAM: ULTRASOUND GUIDED THERAPEUTIC PARACENTESIS MEDICATIONS: Lidocaine  1 % 10 ml COMPLICATIONS: None immediate. PROCEDURE: Informed written consent was obtained from the patient after a discussion of the risks, benefits and alternatives to treatment. A timeout was performed prior to the initiation of the procedure. Initial ultrasound scanning demonstrates a small amount of ascites within the right lower abdominal quadrant. The right lower abdomen was prepped and draped in the usual sterile fashion. 1% lidocaine  was used for local anesthesia. Following this, a 19 gauge, 7-cm, Yueh catheter was introduced. An ultrasound image was saved for documentation purposes. The paracentesis was performed. The catheter was removed and a dressing was applied. The patient tolerated the procedure well without immediate post procedural complication. Patient received post-procedure intravenous albumin ; see nursing notes for details. FINDINGS: A total of approximately 1.5 ml of pink colored fluid was removed. Samples were sent to the laboratory as requested by the clinical team. IMPRESSION: Successful ultrasound-guided paracentesis yielding 1.5 liters of peritoneal fluid. Performed by Delon Beagle NP Electronically Signed   By: Marcey Moan M.D.   On: 03/04/2023 14:10    Labs:  CBC: Recent Labs    02/25/23 0016 02/26/23 0347 02/27/23 0408 03/03/23 0939  WBC 4.5 4.5 3.2* 4.5  HGB 7.6* 7.9* 7.1* 8.4*  HCT 23.7* 25.1* 22.7* 26.4*  PLT 186 227 194 246    COAGS: Recent Labs    12/27/22 1923 01/29/23 1137 02/09/23 0404 02/18/23 0627 02/23/23 0311  INR 1.1 0.9 1.1 1.1 1.1  APTT 58*  --   --  29  --     BMP: Recent Labs    02/22/23 0322 02/23/23 0311 02/25/23 0016 02/27/23 0408  NA 140 139 135 141  K 3.2* 3.9 3.0* 3.9  CL 113* 112* 111 114*  CO2 21* 19* 20* 22  GLUCOSE 124* 113*  152* 108*  BUN 6* 6* 10 9  CALCIUM  8.1* 8.2* 7.7* 8.3*  CREATININE 0.83 0.87 0.86 0.89  GFRNONAA >60 >60 >60 >60    LIVER FUNCTION TESTS: Recent Labs    02/17/23 1103 02/17/23 1735 02/23/23 0311 02/25/23 0016 02/27/23 0408  BILITOT 0.7 0.4 0.4 0.3  --   AST 40 50* 20 21  --   ALT 46* 52* 21 20  --   ALKPHOS 98 104 83 75  --   PROT 5.6*  5.7* 4.8* 4.4*  --   ALBUMIN  2.8* 2.7* 2.0* 1.9* 1.9*    Assessment and Plan: 78 year old female with history of stage I pancreatic adenocarcinoma status post chemoradiation and aborted Whipple who developed 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  We agree additional attempts at portal recanalization in the acute setting would be futile.  She has initiated palliative care.  No plans for further interventions.  If ascites becomes refractory, IR is available for tunneled peritoneal drain if needed.  Follow up with IR as needed.  Electronically Signed: Ester JINNY Sides 03/04/2023, 2:42 PM   I spent a total of 25 Minutes in virtual telephone clinical consultation, greater than 50% of which was counseling/coordinating care for portal thrombus.

## 2023-03-04 NOTE — Procedures (Signed)
 Ultrasound-guided therapeutic paracentesis performed yielding 1.5 liters of pink colored fluid. No immediate complications. EBL is none.

## 2023-03-09 ENCOUNTER — Inpatient Hospital Stay: Payer: Medicare Other

## 2023-03-09 VITALS — BP 120/74 | HR 96 | Temp 98.3°F | Resp 18

## 2023-03-09 DIAGNOSIS — D62 Acute posthemorrhagic anemia: Secondary | ICD-10-CM

## 2023-03-09 DIAGNOSIS — D509 Iron deficiency anemia, unspecified: Secondary | ICD-10-CM

## 2023-03-09 DIAGNOSIS — C250 Malignant neoplasm of head of pancreas: Secondary | ICD-10-CM

## 2023-03-09 LAB — CBC
HCT: 25 % — ABNORMAL LOW (ref 36.0–46.0)
Hemoglobin: 7.7 g/dL — ABNORMAL LOW (ref 12.0–15.0)
MCH: 28.8 pg (ref 26.0–34.0)
MCHC: 30.8 g/dL (ref 30.0–36.0)
MCV: 93.6 fL (ref 80.0–100.0)
Platelets: 225 10*3/uL (ref 150–400)
RBC: 2.67 MIL/uL — ABNORMAL LOW (ref 3.87–5.11)
RDW: 18.9 % — ABNORMAL HIGH (ref 11.5–15.5)
WBC: 3.4 10*3/uL — ABNORMAL LOW (ref 4.0–10.5)
nRBC: 0 % (ref 0.0–0.2)

## 2023-03-09 LAB — PREPARE RBC (CROSSMATCH)

## 2023-03-09 LAB — SAMPLE TO BLOOD BANK

## 2023-03-09 MED ORDER — SODIUM CHLORIDE 0.9 % IV SOLN: INTRAVENOUS | Status: DC

## 2023-03-09 MED ORDER — CETIRIZINE HCL 10 MG PO TABS
10.0000 mg | ORAL_TABLET | Freq: Once | ORAL | Status: AC
  Administered 2023-03-09: 10 mg via ORAL
  Filled 2023-03-09: qty 1

## 2023-03-09 MED ORDER — HEPARIN SOD (PORK) LOCK FLUSH 100 UNIT/ML IV SOLN
500.0000 [IU] | Freq: Once | INTRAVENOUS | Status: AC
  Administered 2023-03-09: 500 [IU] via INTRAVENOUS

## 2023-03-09 MED ORDER — SODIUM CHLORIDE 0.9 % IV SOLN
510.0000 mg | Freq: Once | INTRAVENOUS | Status: AC
  Administered 2023-03-09: 510 mg via INTRAVENOUS
  Filled 2023-03-09: qty 510

## 2023-03-09 MED ORDER — ACETAMINOPHEN 325 MG PO TABS
650.0000 mg | ORAL_TABLET | Freq: Once | ORAL | Status: AC
  Administered 2023-03-09: 650 mg via ORAL
  Filled 2023-03-09: qty 2

## 2023-03-09 MED ORDER — SODIUM CHLORIDE 0.9% FLUSH
10.0000 mL | INTRAVENOUS | Status: DC | PRN
  Administered 2023-03-09: 10 mL via INTRAVENOUS

## 2023-03-09 NOTE — Progress Notes (Signed)
Patient presents today for iron infusion.  Patient is in satisfactory condition with no new complaints voiced.  Vital signs are stable.  We will proceed with infusion per provider orders.    Patient's hemoglobin noted to be 7.7 today. Patient will return to the clinic Friday for 1 unit of blood. Patient made aware and verbalized understanding.  Feraheme 510 mg given today per MD orders. Tolerated infusion without adverse affects. Vital signs stable. No complaints at this time. Discharged from clinic ambulatory in stable condition. Alert and oriented x 3. F/U with [facility] as scheduled.

## 2023-03-09 NOTE — Patient Instructions (Signed)

## 2023-03-10 LAB — CANCER ANTIGEN 19-9: CA 19-9: 292 U/mL — ABNORMAL HIGH (ref 0–35)

## 2023-03-11 ENCOUNTER — Inpatient Hospital Stay: Payer: Medicare Other

## 2023-03-11 DIAGNOSIS — D509 Iron deficiency anemia, unspecified: Secondary | ICD-10-CM

## 2023-03-11 MED ORDER — SODIUM CHLORIDE 0.9% FLUSH
10.0000 mL | INTRAVENOUS | Status: AC | PRN
  Administered 2023-03-11: 10 mL

## 2023-03-11 MED ORDER — ACETAMINOPHEN 325 MG PO TABS
650.0000 mg | ORAL_TABLET | Freq: Once | ORAL | Status: AC
  Administered 2023-03-11: 650 mg via ORAL
  Filled 2023-03-11: qty 2

## 2023-03-11 MED ORDER — HEPARIN SOD (PORK) LOCK FLUSH 100 UNIT/ML IV SOLN
500.0000 [IU] | Freq: Every day | INTRAVENOUS | Status: AC | PRN
  Administered 2023-03-11: 500 [IU]

## 2023-03-11 MED ORDER — DIPHENHYDRAMINE HCL 25 MG PO CAPS
25.0000 mg | ORAL_CAPSULE | Freq: Once | ORAL | Status: AC
  Administered 2023-03-11: 25 mg via ORAL
  Filled 2023-03-11: qty 1

## 2023-03-11 MED ORDER — SODIUM CHLORIDE 0.9% IV SOLUTION
250.0000 mL | INTRAVENOUS | Status: DC
  Administered 2023-03-11: 250 mL via INTRAVENOUS

## 2023-03-11 NOTE — Patient Instructions (Signed)
[facility] [place] - A DEPT OF [facility]  Discharge Instructions: Thank you for choosing [facility] to provide your oncology and hematology care.  If you have a lab appointment with the Cancer Center - please note that after April 8th, 2024, all labs will be drawn in the cancer center.  You do not have to check in or register with the main entrance as you have in the past but will complete your check-in in the cancer center.  Wear comfortable clothing and clothing appropriate for easy access to any Portacath or PICC line.   We strive to give you quality time with your provider. You may need to reschedule your appointment if you arrive late (15 or more minutes).  Arriving late affects you and other patients whose appointments are after yours.  Also, if you miss three or more appointments without notifying the office, you may be dismissed from the clinic at the provider's discretion.      For prescription refill requests, have your pharmacy contact our office and allow 72 hours for refills to be completed.    Today you received the followingb1 unit of PRBCs, return as scheduled.   To help prevent nausea and vomiting after your treatment, we encourage you to take your nausea medication as directed.  BELOW ARE SYMPTOMS THAT SHOULD BE REPORTED IMMEDIATELY: *FEVER GREATER THAN 100.4 F (38 C) OR HIGHER *CHILLS OR SWEATING *NAUSEA AND VOMITING THAT IS NOT CONTROLLED WITH YOUR NAUSEA MEDICATION *UNUSUAL SHORTNESS OF BREATH *UNUSUAL BRUISING OR BLEEDING *URINARY PROBLEMS (pain or burning when urinating, or frequent urination) *BOWEL PROBLEMS (unusual diarrhea, constipation, pain near the anus) TENDERNESS IN MOUTH AND THROAT WITH OR WITHOUT PRESENCE OF ULCERS (sore throat, sores in mouth, or a toothache) UNUSUAL RASH, SWELLING OR PAIN  UNUSUAL VAGINAL DISCHARGE OR ITCHING   Items with * indicate a potential emergency and should be followed up as soon as  possible or go to the Emergency Department if any problems should occur.  Please show the CHEMOTHERAPY ALERT CARD or IMMUNOTHERAPY ALERT CARD at check-in to the Emergency Department and triage nurse.  Should you have questions after your visit or need to cancel or reschedule your appointment, please contact [facility] [place] - A DEPT OF [facility] [phone]  and follow the prompts.  Office hours are 8:00 a.m. to 4:30 p.m. Monday - Friday. Please note that voicemails left after 4:00 p.m. may not be returned until the following business day.  We are closed weekends and major holidays. You have access to a nurse at all times for urgent questions. Please call the main number to the clinic [phone] and follow the prompts.  For any non-urgent questions, you may also contact your provider using MyChart. We now offer e-Visits for anyone 58 and older to request care online for non-urgent symptoms. For details visit [email or web address].   Also download the MyChart app! Go to the app store, search "MyChart", open the app, select [place], and log in with your MyChart username and password.

## 2023-03-11 NOTE — Progress Notes (Signed)
Patient tolerated blood transfusion with no complaints voiced. Side effects with management reviewed with understanding verbalized. Port site clean and dry with no bruising or swelling noted at site. Good blood return noted before and after administration of therapy. Band aid applied. Patient left in satisfactory condition with VSS and no s/s of distress noted.

## 2023-03-12 ENCOUNTER — Other Ambulatory Visit: Payer: Self-pay

## 2023-03-12 ENCOUNTER — Observation Stay (HOSPITAL_COMMUNITY)
Admission: EM | Admit: 2023-03-12 | Discharge: 2023-03-13 | Disposition: A | Discharge status: Home / Self Care | Payer: Medicare Other | Attending: Internal Medicine | Admitting: Internal Medicine

## 2023-03-12 ENCOUNTER — Encounter (HOSPITAL_COMMUNITY): Payer: Self-pay

## 2023-03-12 DIAGNOSIS — K922 Gastrointestinal hemorrhage, unspecified: Secondary | ICD-10-CM | POA: Diagnosis not present

## 2023-03-12 DIAGNOSIS — Z86711 Personal history of pulmonary embolism: Secondary | ICD-10-CM | POA: Diagnosis not present

## 2023-03-12 DIAGNOSIS — K560 Paralytic ileus: Secondary | ICD-10-CM | POA: Diagnosis not present

## 2023-03-12 DIAGNOSIS — K2100 Gastro-esophageal reflux disease with esophagitis, without bleeding: Secondary | ICD-10-CM

## 2023-03-12 DIAGNOSIS — K219 Gastro-esophageal reflux disease without esophagitis: Secondary | ICD-10-CM | POA: Diagnosis present

## 2023-03-12 DIAGNOSIS — Z79899 Other long term (current) drug therapy: Secondary | ICD-10-CM | POA: Diagnosis not present

## 2023-03-12 DIAGNOSIS — C259 Malignant neoplasm of pancreas, unspecified: Secondary | ICD-10-CM

## 2023-03-12 DIAGNOSIS — D509 Iron deficiency anemia, unspecified: Secondary | ICD-10-CM

## 2023-03-12 DIAGNOSIS — F32A Depression, unspecified: Secondary | ICD-10-CM | POA: Diagnosis not present

## 2023-03-12 DIAGNOSIS — K31819 Angiodysplasia of stomach and duodenum without bleeding: Secondary | ICD-10-CM

## 2023-03-12 DIAGNOSIS — I10 Essential (primary) hypertension: Secondary | ICD-10-CM

## 2023-03-12 DIAGNOSIS — C250 Malignant neoplasm of head of pancreas: Secondary | ICD-10-CM

## 2023-03-12 LAB — POC OCCULT BLOOD, ED: Fecal Occult Bld: POSITIVE — AB

## 2023-03-12 LAB — COMPREHENSIVE METABOLIC PANEL
ALT: 33 U/L (ref 0–44)
AST: 39 U/L (ref 15–41)
Albumin: 3.2 g/dL — ABNORMAL LOW (ref 3.5–5.0)
Alkaline Phosphatase: 97 U/L (ref 38–126)
Anion gap: 10 (ref 5–15)
BUN: 24 mg/dL — ABNORMAL HIGH (ref 8–23)
CO2: 21 mmol/L — ABNORMAL LOW (ref 22–32)
Calcium: 9 mg/dL (ref 8.9–10.3)
Chloride: 109 mmol/L (ref 98–111)
Creatinine, Ser: 0.73 mg/dL (ref 0.44–1.00)
GFR, Estimated: >60 mL/min (ref >60)
Glucose, Bld: 139 mg/dL — ABNORMAL HIGH (ref 70–99)
Potassium: 3.9 mmol/L (ref 3.5–5.1)
Sodium: 140 mmol/L (ref 135–145)
Total Bilirubin: 0.6 mg/dL (ref 0.0–1.2)
Total Protein: 6.2 g/dL — ABNORMAL LOW (ref 6.5–8.1)

## 2023-03-12 LAB — TYPE AND SCREEN
ABO/RH(D): A POS
ABO/RH(D): A POS
Antibody Screen: NEGATIVE
Antibody Screen: NEGATIVE
Unit division: 0
Unit division: 0

## 2023-03-12 LAB — CBC
HCT: 29.5 % — ABNORMAL LOW (ref 36.0–46.0)
Hemoglobin: 9.4 g/dL — ABNORMAL LOW (ref 12.0–15.0)
MCH: 29.5 pg (ref 26.0–34.0)
MCHC: 31.9 g/dL (ref 30.0–36.0)
MCV: 92.5 fL (ref 80.0–100.0)
Platelets: 187 10*3/uL (ref 150–400)
RBC: 3.19 MIL/uL — ABNORMAL LOW (ref 3.87–5.11)
RDW: 18.9 % — ABNORMAL HIGH (ref 11.5–15.5)
WBC: 4.3 10*3/uL (ref 4.0–10.5)
nRBC: 0 % (ref 0.0–0.2)

## 2023-03-12 LAB — BPAM RBC
Blood Product Expiration Date: 202503102359
Blood Product Expiration Date: 202503172359
ISSUE DATE / TIME: 202502122214
ISSUE DATE / TIME: 202502141021
Unit Type and Rh: 5100
Unit Type and Rh: 6200

## 2023-03-12 MED ORDER — SODIUM CHLORIDE 0.9 % IV BOLUS
500.0000 mL | Freq: Once | INTRAVENOUS | Status: AC
  Administered 2023-03-12: 500 mL via INTRAVENOUS

## 2023-03-12 MED ORDER — ONDANSETRON HCL 4 MG/2ML IJ SOLN: 4.0000 mg | Freq: Four times a day (QID) | INTRAMUSCULAR | Status: DC | PRN

## 2023-03-12 MED ORDER — SODIUM CHLORIDE 0.9% FLUSH
3.0000 mL | Freq: Two times a day (BID) | INTRAVENOUS | Status: DC
  Administered 2023-03-12 – 2023-03-13 (×3): 3 mL via INTRAVENOUS

## 2023-03-12 MED ORDER — ACETAMINOPHEN 650 MG RE SUPP: 650.0000 mg | Freq: Four times a day (QID) | RECTAL | Status: DC | PRN

## 2023-03-12 MED ORDER — ACETAMINOPHEN 325 MG PO TABS
650.0000 mg | ORAL_TABLET | Freq: Four times a day (QID) | ORAL | Status: DC | PRN
  Administered 2023-03-12: 650 mg via ORAL
  Filled 2023-03-12: qty 2

## 2023-03-12 MED ORDER — ONDANSETRON HCL 4 MG PO TABS: 4.0000 mg | ORAL_TABLET | Freq: Four times a day (QID) | ORAL | Status: DC | PRN

## 2023-03-12 MED ORDER — SODIUM CHLORIDE 0.9 % IV SOLN: 250.0000 mL | INTRAVENOUS | Status: AC | PRN

## 2023-03-12 MED ORDER — SODIUM CHLORIDE 0.9 % IV SOLN
50.0000 ug/h | INTRAVENOUS | Status: DC
  Administered 2023-03-12 – 2023-03-13 (×2): 50 ug/h via INTRAVENOUS
  Filled 2023-03-12 (×6): qty 1

## 2023-03-12 MED ORDER — SODIUM CHLORIDE 0.9% FLUSH: 3.0000 mL | INTRAVENOUS | Status: DC | PRN

## 2023-03-12 MED ORDER — OCTREOTIDE LOAD VIA INFUSION
100.0000 ug | Freq: Once | INTRAVENOUS | Status: AC
  Administered 2023-03-12: 100 ug via INTRAVENOUS
  Filled 2023-03-12: qty 50

## 2023-03-12 MED ORDER — SUCRALFATE 1 GM/10ML PO SUSP
1.0000 g | Freq: Three times a day (TID) | ORAL | Status: DC
Start: 2023-03-12 — End: 2023-03-13
  Administered 2023-03-12 – 2023-03-13 (×3): 1 g via ORAL
  Filled 2023-03-12 (×3): qty 10

## 2023-03-12 MED ORDER — PANTOPRAZOLE SODIUM 40 MG IV SOLR
40.0000 mg | Freq: Once | INTRAVENOUS | Status: AC
  Administered 2023-03-12: 40 mg via INTRAVENOUS
  Filled 2023-03-12: qty 10

## 2023-03-12 MED ORDER — PANTOPRAZOLE SODIUM 40 MG IV SOLR
40.0000 mg | Freq: Two times a day (BID) | INTRAVENOUS | Status: DC
  Administered 2023-03-12 – 2023-03-13 (×2): 40 mg via INTRAVENOUS
  Filled 2023-03-12 (×3): qty 10

## 2023-03-12 NOTE — Assessment & Plan Note (Signed)
-  Patient has stopped any further chemotherapy or treatment for her pancreatic cancer. -Palliative care has been initiated as an outpatient -Patient is DNR; wishes will be respected. -Continue as needed follow-up with oncology service.

## 2023-03-12 NOTE — ED Provider Notes (Signed)
 [place] EMERGENCY DEPARTMENT AT [facility] Provider Note   CSN: [id number] Arrival date & time: 03/12/23  1152     History {Add pertinent medical, surgical, social history, OB history to HPI:1} Chief Complaint  Patient presents with   Abdominal Pain    Sheryl Bender is a 78 y.o. female.  Patient with a history of pancreatic cancer and gastric ulcers with 7 episodes of GI bleeds.  Patient is on palliative care.  She had 2 episodes of bleeding today   Abdominal Pain      Home Medications Prior to Admission medications   Medication Sig Start Date End Date Taking? Authorizing Provider  acetaminophen (TYLENOL) 500 MG tablet Take 2 tablets (1,000 mg total) by mouth every 8 (eight) hours as needed for moderate pain, headache or fever. Patient taking differently: Take 750-1,000 mg by mouth every 8 (eight) hours as needed for moderate pain (pain score 4-6), headache or fever. 07/21/21   Vassie Loll, MD  benzonatate (TESSALON) 100 MG capsule Take 1 capsule (100 mg total) by mouth 3 (three) times daily as needed for cough. 03/01/23   Almon Hercules, MD  buPROPion (WELLBUTRIN XL) 300 MG 24 hr tablet Take 300 mg by mouth daily after breakfast. 06/15/21   [provider]  busPIRone (BUSPAR) 10 MG tablet Take 20 mg by mouth 2 (two) times daily. 01/27/21   [provider]  Cholecalciferol (VITAMIN D3) 125 MCG (5000 UT) CAPS Take 10,000 Units by mouth every morning.    [provider]  escitalopram (LEXAPRO) 20 MG tablet Take 20 mg by mouth at bedtime.    [provider]  lactose free nutrition (BOOST) LIQD Take 237 mLs by mouth 2 (two) times daily between meals.    [provider]  lidocaine-prilocaine (EMLA) cream Apply a small amount to port a cath site (do not rub in) and cover with plastic wrap 1 hour prior to infusion appointments 09/15/21   Doreatha Massed, MD  LINZESS 72 MCG capsule TAKE 1 CAPSULE BY MOUTH DAILY BEFORE  BREAKFAST Patient taking differently: Take 72 mcg by mouth daily before breakfast. 01/20/23   Mansouraty, Netty Starring., MD  mirtazapine (REMERON) 30 MG tablet Take 15 mg by mouth at bedtime.    [provider]  pantoprazole (PROTONIX) 40 MG tablet Take 1 tablet (40 mg total) by mouth 2 (two) times daily. 02/02/23 05/03/23  Carollee Herter, DO  QUEtiapine (SEROQUEL) 25 MG tablet Take 25 mg by mouth 2 (two) times daily. 12/14/22   [provider]  sucralfate (CARAFATE) 1 g tablet Take 1 tablet (1 g total) by mouth 4 (four) times daily -  with meals and at bedtime for 14 days. 03/01/23 03/15/23  Almon Hercules, MD      Allergies    Other, Cherry, and Wound dressing adhesive    Review of Systems   Review of Systems  Gastrointestinal:  Positive for abdominal pain.    Physical Exam Updated Vital Signs BP (!) 119/58 (BP Location: Right Arm)   Pulse 95   Temp 97.8 F (36.6 C) (Oral)   Resp 18   Ht 5\' 3"  (1.6 m)   Wt 62 kg   SpO2 99%   BMI 24.21 kg/m  Physical Exam  ED Results / Procedures / Treatments   Labs (all labs ordered are listed, but only abnormal results are displayed) Labs Reviewed  COMPREHENSIVE METABOLIC PANEL - Abnormal; Notable for the following components:      Result Value  CO2 21 (*)    Glucose, Bld 139 (*)    BUN 24 (*)    Total Protein 6.2 (*)    Albumin 3.2 (*)    All other components within normal limits  CBC - Abnormal; Notable for the following components:   RBC 3.19 (*)    Hemoglobin 9.4 (*)    HCT 29.5 (*)    RDW 18.9 (*)    All other components within normal limits  POC OCCULT BLOOD, ED - Abnormal; Notable for the following components:   Fecal Occult Bld POSITIVE (*)    All other components within normal limits  TYPE AND SCREEN    EKG None  Radiology No results found.  Procedures Procedures  {Document cardiac monitor, telemetry assessment procedure when appropriate:1}  Medications Ordered in ED Medications  pantoprazole  (PROTONIX) injection 40 mg (has no administration in time range)  sucralfate (CARAFATE) 1 GM/10ML suspension 1 g (has no administration in time range)  octreotide (SANDOSTATIN) 2 mcg/mL load via infusion 100 mcg (has no administration in time range)    And  octreotide (SANDOSTATIN) 500 mcg in sodium chloride 0.9 % 250 mL (2 mcg/mL) infusion (has no administration in time range)  sodium chloride flush (NS) 0.9 % injection 3 mL (has no administration in time range)  sodium chloride flush (NS) 0.9 % injection 3 mL (has no administration in time range)  0.9 %  sodium chloride infusion (has no administration in time range)  acetaminophen (TYLENOL) tablet 650 mg (has no administration in time range)    Or  acetaminophen (TYLENOL) suppository 650 mg (has no administration in time range)  ondansetron (ZOFRAN) tablet 4 mg (has no administration in time range)    Or  ondansetron (ZOFRAN) injection 4 mg (has no administration in time range)  pantoprazole (PROTONIX) injection 40 mg (40 mg Intravenous Given 03/12/23 1331)  sodium chloride 0.9 % bolus 500 mL (500 mLs Intravenous New Bag/Given 03/12/23 1322)    ED Course/ Medical Decision Making/ A&P   {   Click here for ABCD2, HEART and other calculatorsREFRESH Note before signing :1}                              Medical Decision Making Amount and/or Complexity of Data Reviewed Labs: ordered.  Risk Prescription drug management. Decision regarding hospitalization.   Patient with upper GI bleed and pancreatic cancer.  She will be admitted to medicine with GI consult  {Document critical care time when appropriate:1} {Document review of labs and clinical decision tools ie heart score, Chads2Vasc2 etc:1}  {Document your independent review of radiology images, and any outside records:1} {Document your discussion with family members, caretakers, and with consultants:1} {Document social determinants of health affecting pt's care:1} {Document your  decision making why or why not admission, treatments were needed:1} Final Clinical Impression(s) / ED Diagnoses Final diagnoses:  Upper GI bleed    Rx / DC Orders ED Discharge Orders     None

## 2023-03-12 NOTE — ED Triage Notes (Signed)
 Pt states that she believes she has another GI bleed due to color of stools and having diarrhea. Pt stated that she has had 7 GI bleeds in the past.

## 2023-03-12 NOTE — Consult Note (Signed)
 Vista Lawman, M.D. 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  Patient was last transferred to [facility] January 2025 underwent TIPS on 02/09/2023 with portal thrombectomy of the main portal vein and SMV balloon angioplasty and embolization of left gastric vein.  Repeat CT scan in January showed reocclusion of TIPS.  Patient was seen by IR and gastroenterology at [facility] and as per notes seems all options were exhausted and patient was transferred to hospice facility.  Currently patient is here today because she thought there was red color change to her stool and hence was told by the nurse at the facility to go to the ER  Patient with baseline hemoglobin 7-8 had a blood transfusion done yesterday with appropriate transfusion response to 9.4 BUN 24 creatinine 0.73  Past Medical History: SEE CHRONIC ISSSUES: Past Medical History:  Diagnosis Date   Anxiety    Arthritis    Depression    Dysrhythmia    hx palpitations greater than 5 yrs -neg echo, stress per pt ? where or dr   GERD (gastroesophageal reflux disease)    occ   Nausea & vomiting 08/24/2021   Pancreatic adenocarcinoma (HCC)    Pancreatic pseudocyst    Port-A-Cath in place 09/15/2021   Pulmonary embolism [facility])    September 2022   Past Surgical History:  Past Surgical History:  Procedure Laterality Date   ANTERIOR LAT LUMBAR FUSION Right  12/16/2015   Procedure: RIGHT LUMBAR TWO-THREE, LUMBAR THREE-FOUR, LUMBAR FOUR-FIVE ANTEROLATERAL LUMBAR INTERBODY FUSION;  Surgeon: Maeola Harman, MD;  [facility]: [facility] OR;  Service: Neurosurgery;  Laterality: Right;   BALLOON DILATION N/A 08/27/2021   Procedure: BALLOON DILATION;  Surgeon: Meridee Score Netty Starring., MD;  Location: [facility] ENDOSCOPY;  Service: Gastroenterology;  Laterality: N/A;   BIOPSY  07/09/2021   Procedure: BIOPSY;  Surgeon: Meridee Score Netty Starring., MD;  Location: [facility] ENDOSCOPY;  Service: Gastroenterology;;   COLONOSCOPY WITH PROPOFOL N/A 12/29/2022   Procedure: COLONOSCOPY WITH PROPOFOL;  Surgeon: Corbin Ade, MD;  [facility] ENDO [facility];  Service: Endoscopy;  Laterality: N/A;   CYST GASTROSTOMY  08/27/2021   Procedure: CYST GASTROSTOMY;  Surgeon: Meridee Score Netty Starring., MD;  Location: [facility] ENDOSCOPY;  Service: Gastroenterology;;   ESOPHAGOGASTRODUODENOSCOPY N/A 07/09/2021   Procedure: ESOPHAGOGASTRODUODENOSCOPY (EGD);  Surgeon: Lemar Lofty., MD;  Location: [facility] ENDOSCOPY;  Service: Gastroenterology;  Laterality: N/A;   ESOPHAGOGASTRODUODENOSCOPY N/A 02/07/2023   Procedure: ESOPHAGOGASTRODUODENOSCOPY (EGD);  Surgeon: Imogene Burn, MD;  Location: [facility] ENDOSCOPY;  Service: Gastroenterology;  Laterality: N/A;   ESOPHAGOGASTRODUODENOSCOPY (EGD) WITH PROPOFOL N/A 08/27/2021   Procedure: ESOPHAGOGASTRODUODENOSCOPY (EGD) WITH PROPOFOL;  Surgeon: Meridee Score Netty Starring., MD;  Location: [facility] ENDOSCOPY;  Service: Gastroenterology;  Laterality: N/A;   ESOPHAGOGASTRODUODENOSCOPY (EGD) WITH PROPOFOL N/A 10/08/2021  Procedure: ESOPHAGOGASTRODUODENOSCOPY (EGD) WITH PROPOFOL;  Surgeon: Meridee Score Netty Starring., MD;  Location: [facility] ENDOSCOPY;  Service: Gastroenterology;  Laterality: N/A;   ESOPHAGOGASTRODUODENOSCOPY (EGD) WITH PROPOFOL N/A 12/29/2022   Procedure: ESOPHAGOGASTRODUODENOSCOPY (EGD) WITH PROPOFOL;  Surgeon: Corbin Ade, MD;  [facility];  Service: Endoscopy;  Laterality: N/A;    ESOPHAGOGASTRODUODENOSCOPY (EGD) WITH PROPOFOL N/A 01/13/2023   Procedure: ESOPHAGOGASTRODUODENOSCOPY (EGD) WITH PROPOFOL;  Surgeon: Franky Macho, MD;  [facility];  Service: Endoscopy;  Laterality: N/A;   ESOPHAGOGASTRODUODENOSCOPY (EGD) WITH PROPOFOL N/A 01/30/2023   Procedure: ESOPHAGOGASTRODUODENOSCOPY (EGD) WITH PROPOFOL;  Surgeon: Lanelle Bal, DO;  [facility];  Service: Endoscopy;  Laterality: N/A;   ESOPHAGOGASTRODUODENOSCOPY (EGD) WITH PROPOFOL N/A 02/18/2023   Procedure: ESOPHAGOGASTRODUODENOSCOPY (EGD) WITH PROPOFOL;  Surgeon: Lynann Bologna, MD;  Location: [facility] ENDOSCOPY;  Service: Gastroenterology;  Laterality: N/A;   EUS N/A 07/09/2021   Procedure: UPPER ENDOSCOPIC ULTRASOUND (EUS) RADIAL;  Surgeon: Lemar Lofty., MD;  Location: [facility] ENDOSCOPY;  Service: Gastroenterology;  Laterality: N/A;   EUS N/A 08/27/2021   Procedure: UPPER ENDOSCOPIC ULTRASOUND (EUS) LINEAR;  Surgeon: Lemar Lofty., MD;  Location: [facility] ENDOSCOPY;  Service: Gastroenterology;  Laterality: N/A;   FINE NEEDLE ASPIRATION  07/09/2021   Procedure: FINE NEEDLE ASPIRATION (FNA) LINEAR;  Surgeon: Lemar Lofty., MD;  Location: [facility] ENDOSCOPY;  Service: Gastroenterology;;   HEMOSTASIS CLIP PLACEMENT  12/29/2022   Procedure: HEMOSTASIS CLIP PLACEMENT;  Surgeon: Corbin Ade, MD;  [facility];  Service: Endoscopy;;   HEMOSTASIS CLIP PLACEMENT  02/18/2023   Procedure: HEMOSTASIS CLIP PLACEMENT;  Surgeon: Lynann Bologna, MD;  Location: [facility] ENDOSCOPY;  Service: Gastroenterology;;   HEMOSTASIS CONTROL  02/18/2023   Procedure: HEMOSTASIS CONTROL;  Surgeon: Lynann Bologna, MD;  Location: [facility] ENDOSCOPY;  Service: Gastroenterology;;   HOT HEMOSTASIS  12/29/2022   Procedure: HOT HEMOSTASIS (ARGON PLASMA COAGULATION/BICAP);  Surgeon: Corbin Ade, MD;  [facility];  Service: Endoscopy;;   HOT HEMOSTASIS  01/13/2023   Procedure: HOT HEMOSTASIS (ARGON PLASMA  COAGULATION/BICAP);  Surgeon: Franky Macho, MD;  [facility];  Service: Endoscopy;;  used apc and goldprobe   HOT HEMOSTASIS  01/30/2023   Procedure: HOT HEMOSTASIS (ARGON PLASMA COAGULATION/BICAP);  Surgeon: Lanelle Bal, DO;  [facility];  Service: Endoscopy;;   HOT HEMOSTASIS N/A 02/07/2023   Procedure: HOT HEMOSTASIS (ARGON PLASMA COAGULATION/BICAP);  Surgeon: Imogene Burn, MD;  Location: [facility] ENDOSCOPY;  Service: Gastroenterology;  Laterality: N/A;   HOT HEMOSTASIS N/A 02/18/2023   Procedure: HOT HEMOSTASIS (ARGON PLASMA COAGULATION/BICAP);  Surgeon: Lynann Bologna, MD;  Location: [facility] ENDOSCOPY;  Service: Gastroenterology;  Laterality: N/A;   IR EMBO VENOUS NOT HEMORR HEMANG  INC GUIDE ROADMAPPING  02/09/2023   IR IMAGING GUIDED PORT INSERTION  09/14/2021   IR RADIOLOGIST EVAL & MGMT  03/04/2023   IR THROMBECT SEC MECH MOD SED  02/23/2023   IR THROMBECT VENO MECH MOD SED  02/09/2023   IR TIPS  02/09/2023   IR TIPS REVISION MOD SED  02/23/2023   IR US GUIDE VASC ACCESS LEFT  02/23/2023   IR US GUIDE VASC ACCESS RIGHT  02/09/2023   IR US GUIDE VASC ACCESS RIGHT  02/09/2023   LAPAROSCOPY N/A 02/15/2022   Procedure: STAGING DIAGNOSTIC;  Surgeon: Fritzi Mandes, MD;  Location: [facility] OR;  Service: General;  Laterality: N/A;   LUMBAR PERCUTANEOUS PEDICLE SCREW 3 LEVEL Bilateral 12/16/2015   Procedure: PERCUTANEOUS PEDICLE SCREWS BILATERALLY AT LUMBAR  TWO-FIVE;  Surgeon: Maeola Harman, MD;  Location: [facility] OR;  Service: Neurosurgery;  Laterality: Bilateral;   PANCREATIC STENT PLACEMENT  08/27/2021   Procedure: PANCREATIC STENT PLACEMENT;  Surgeon: Lemar Lofty., MD;  Location: [facility] ENDOSCOPY;  Service: Gastroenterology;;   POLYPECTOMY  12/29/2022   Procedure: POLYPECTOMY INTESTINAL;  Surgeon: Corbin Ade, MD;  [facility];  Service: Endoscopy;;   STENT REMOVAL  10/08/2021   Procedure: Francine Graven REMOVAL;  Surgeon: Lemar Lofty., MD;  Location: [facility] ENDOSCOPY;   Service: Gastroenterology;;   TIPS PROCEDURE N/A 02/09/2023   Procedure: TRANS-JUGULAR INTRAHEPATIC PORTAL SHUNT (TIPS);  Surgeon: Bennie Dallas, MD;  Location: [facility] OR;  Service: Radiology;  Laterality: N/A;   TUBAL LIGATION  1974   WHIPPLE PROCEDURE N/A 02/15/2022   Procedure: ATTEMPTED WHIPPLE PROCEDURE;  Surgeon: Fritzi Mandes, MD;  Location: [facility] OR;  Service: General;  Laterality: N/A;   Family History:  Family History  Problem Relation Age of Onset   Pancreatitis Brother        alcoholic pancreatitis   Pancreatic cancer Neg Hx    Colon cancer Neg Hx    Social History:  Social History   Tobacco Use   Smoking status: Never   Smokeless tobacco: Never  Vaping Use   Vaping status: Never Used  Substance Use Topics   Alcohol use: No   Drug use: Never    Home Medications:  Prior to Admission medications   Medication Sig Start Date End Date Taking? Authorizing Provider  acetaminophen (TYLENOL) 500 MG tablet Take 2 tablets (1,000 mg total) by mouth every 8 (eight) hours as needed for moderate pain, headache or fever. Patient taking differently: Take 750-1,000 mg by mouth every 8 (eight) hours as needed for moderate pain (pain score 4-6), headache or fever. 07/21/21  Yes Vassie Loll, MD  benzonatate (TESSALON) 100 MG capsule Take 1 capsule (100 mg total) by mouth 3 (three) times daily as needed for cough. 03/01/23  Yes Almon Hercules, MD  buPROPion (WELLBUTRIN XL) 300 MG 24 hr tablet Take 300 mg by mouth daily after breakfast. 06/15/21  Yes [provider]  busPIRone (BUSPAR) 10 MG tablet Take 20 mg by mouth 2 (two) times daily. 01/27/21  Yes [provider]  Cholecalciferol (VITAMIN D3) 125 MCG (5000 UT) CAPS Take 10,000 Units by mouth every morning.   Yes [provider]  escitalopram (LEXAPRO) 20 MG tablet Take 20 mg by mouth at bedtime.   Yes [provider]  lactose free nutrition (BOOST) LIQD Take 237 mLs by mouth 2 (two) times daily between  meals.   Yes [provider]  lidocaine-prilocaine (EMLA) cream Apply a small amount to port a cath site (do not rub in) and cover with plastic wrap 1 hour prior to infusion appointments 09/15/21  Yes Doreatha Massed, MD  LINZESS 72 MCG capsule TAKE 1 CAPSULE BY MOUTH DAILY BEFORE BREAKFAST Patient taking differently: Take 72 mcg by mouth every other day. In the morning 01/20/23  Yes Mansouraty, Netty Starring., MD  mirtazapine (REMERON) 30 MG tablet Take 15 mg by mouth at bedtime.   Yes [provider]  ondansetron (ZOFRAN) 4 MG tablet Take 4 mg by mouth every 8 (eight) hours as needed for nausea or vomiting.   Yes [provider]  pantoprazole (PROTONIX) 40 MG tablet Take 1 tablet (40 mg total) by mouth 2 (two) times daily. 02/02/23 05/03/23 Yes Carollee Herter, DO  QUEtiapine (SEROQUEL) 25 MG tablet Take 25 mg  by mouth 2 (two) times daily. 12/14/22  Yes [provider]  sucralfate (CARAFATE) 1 g tablet Take 1 tablet (1 g total) by mouth 4 (four) times daily -  with meals and at bedtime for 14 days. 03/01/23 03/15/23 Yes Almon Hercules, MD    Inpatient Medications:  Current Facility-Administered Medications:    0.9 %  sodium chloride infusion, 250 mL, Intravenous, PRN, Vassie Loll, MD   acetaminophen (TYLENOL) tablet 650 mg, 650 mg, Oral, Q6H PRN **OR** acetaminophen (TYLENOL) suppository 650 mg, 650 mg, Rectal, Q6H PRN, Vassie Loll, MD   octreotide (SANDOSTATIN) 2 mcg/mL load via infusion 100 mcg, 100 mcg, Intravenous, Once **AND** octreotide (SANDOSTATIN) 500 mcg in sodium chloride 0.9 % 250 mL (2 mcg/mL) infusion, 50 mcg/hr, Intravenous, Continuous, Vassie Loll, MD   ondansetron (ZOFRAN) tablet 4 mg, 4 mg, Oral, Q6H PRN **OR** ondansetron (ZOFRAN) injection 4 mg, 4 mg, Intravenous, Q6H PRN, Vassie Loll, MD   pantoprazole (PROTONIX) injection 40 mg, 40 mg, Intravenous, Q12H, Vassie Loll, MD   sodium chloride flush (NS) 0.9 % injection 3 mL, 3 mL,  Intravenous, Q12H, Vassie Loll, MD   sodium chloride flush (NS) 0.9 % injection 3 mL, 3 mL, Intravenous, PRN, Vassie Loll, MD   sucralfate (CARAFATE) 1 GM/10ML suspension 1 g, 1 g, Oral, TID, Vassie Loll, MD Allergies: Other, Cherry, and Wound dressing adhesive  Complete Review of Systems: GENERAL: negative for malaise, night sweats HEENT: No changes in hearing or vision, no nose bleeds or other nasal problems. NECK: Negative for lumps, goiter, pain and significant neck swelling RESPIRATORY: Negative for cough, wheezing CARDIOVASCULAR: Negative for chest pain, leg swelling, palpitations, orthopnea GI: SEE HPI MUSCULOSKELETAL: Negative for joint pain or swelling, back pain, and muscle pain. SKIN: Negative for lesions, rash PSYCH: Negative for sleep disturbance, mood disorder and recent psychosocial stressors. HEMATOLOGY Negative for prolonged bleeding, bruising easily, and swollen nodes. ENDOCRINE: Negative for cold or heat intolerance, polyuria, polydipsia and goiter. NEURO: negative for tremor, gait imbalance, syncope and seizures. The remainder of the review of systems is noncontributory.  Physical Exam: BP 115/67 (BP Location: Right Arm)   Pulse 90   Temp 97.9 F (36.6 C) (Oral)   Resp 20   Ht 5\' 2"  (1.575 m)   Wt 59.4 kg   SpO2 97%   BMI 23.96 kg/m  GENERAL: The patient is AO x3, in no acute distress. HEENT: Head is normocephalic and atraumatic. EOMI are intact. Mouth is well hydrated and without lesions. NECK: Supple. No masses LUNGS: Clear to auscultation. No presence of rhonchi/wheezing/rales. Adequate chest expansion HEART: RRR, normal s1 and s2. ABDOMEN: Soft, nontender, no guarding, no peritoneal signs, and nondistended. BS +. No masses.  Laboratory Data CBC:     Component Value Date/Time   WBC 4.3 03/12/2023 1220   RBC 3.19 (L) 03/12/2023 1220   HGB 9.4 (L) 03/12/2023 1220   HGB 10.4 (L) 02/03/2023 1552   HCT 29.5 (L) 03/12/2023 1220   HCT 33.5 (L)  02/03/2023 1552   PLT 187 03/12/2023 1220   PLT 208 02/03/2023 1552   MCV 92.5 03/12/2023 1220   MCV 95 02/03/2023 1552   MCH 29.5 03/12/2023 1220   MCHC 31.9 03/12/2023 1220   RDW 18.9 (H) 03/12/2023 1220   RDW 15.2 02/03/2023 1552   LYMPHSABS 0.6 (L) 03/03/2023 0939   LYMPHSABS 0.9 02/03/2023 1552   MONOABS 0.3 03/03/2023 0939   EOSABS 0.1 03/03/2023 0939   EOSABS 0.0 02/03/2023 1552   BASOSABS 0.0 03/03/2023  9147   BASOSABS 0.0 02/03/2023 1552   COAG:  Lab Results  Component Value Date   INR 1.1 02/23/2023   INR 1.1 02/18/2023   INR 1.1 02/09/2023    BMP:     Latest Ref Rng & Units 03/12/2023   12:20 PM 02/27/2023    4:08 AM 02/25/2023   12:16 AM  BMP  Glucose 70 - 99 mg/dL 829  562  130   BUN 8 - 23 mg/dL 24  9  10    Creatinine 0.44 - 1.00 mg/dL 8.65  7.84  6.96   Sodium 135 - 145 mmol/L 140  141  135   Potassium 3.5 - 5.1 mmol/L 3.9  3.9  3.0   Chloride 98 - 111 mmol/L 109  114  111   CO2 22 - 32 mmol/L 21  22  20    Calcium 8.9 - 10.3 mg/dL 9.0  8.3  7.7     HEPATIC:     Latest Ref Rng & Units 03/12/2023   12:20 PM 02/27/2023    4:08 AM 02/25/2023   12:16 AM  Hepatic Function  Total Protein 6.5 - 8.1 g/dL 6.2   4.4   Albumin 3.5 - 5.0 g/dL 3.2  1.9  1.9   AST 15 - 41 U/L 39   21   ALT 0 - 44 U/L 33   20   Alk Phosphatase 38 - 126 U/L 97   75   Total Bilirubin 0.0 - 1.2 mg/dL 0.6   0.3     CARDIAC: No results found for: "CKTOTAL", "CKMB", "CKMBINDEX", "TROPONINI"   Imaging: I personally reviewed and interpreted the available imaging.  Assessment & Plan: 78 year old female with history of PE , IDA, AVMs, pancreatic adenocarcinoma status post chemoradiation, PVT, SMV thrombus presented with concerns of GI bleed for which GI is consulted.    Patient was last transferred to [facility] January 2025 underwent TIPS on 02/09/2023 with portal thrombectomy of the main portal vein and SMV balloon angioplasty and embolization of left gastric vein.   Repeat CT scan in  January showed reocclusion of TIPS.   Patient was seen by IR and gastroenterology at [facility] and as per notes seems all options were exhausted and patient was transferred to hospice facility.   At this time unlikely patient is actively bleeding given stable hemoglobin which is actually better than before Patient with baseline hemoglobin 7-8 had a blood transfusion done yesterday with appropriate transfusion response to 9.4  Patient could develop recurrent bleeding likely upper GI in etiology with persistent GAVE or gastric ulcerations, but again patient had at least 5 upper endoscopies since December 2024 which have not change the trajectory of her disease course .  At this time would manage patient conservatively with transfusion as needed and PPI   Vista Lawman, MD Gastroenterology and Hepatology [facility]  This chart has been completed using [facility] Dictation software, and while attempts have been made to ensure accuracy , certain words and phrases may not be transcribed as intended

## 2023-03-12 NOTE — Assessment & Plan Note (Signed)
-  Blood pressure stable -Continue to follow vital signs -Will be judicious with any antihypertensive in the setting of GI bleed.

## 2023-03-12 NOTE — Assessment & Plan Note (Signed)
-  No suicidal ideation hallucination -Continue treatment with Lexapro, BuSpar and Remeron.

## 2023-03-12 NOTE — Assessment & Plan Note (Signed)
-  With component of diarrhea -Will continue supportive care; for now holding on Linzess -As mentioned above we will provide bowel rest and allow clear liquid diet.

## 2023-03-12 NOTE — Assessment & Plan Note (Addendum)
-  Fecal occult test blood positive -Patient with history of pancreatic cancer, gastritis and recent IR evaluation mild viruses development. -Most likely component of gastropathy -Bowel rest with clear liquid diet -Started on IV PPI and octreotide -Continue Carafate -Follow hemoglobin trend and transfuse as needed -GI service has been consulted; unclear if any intervention will change overall outcome. -Avoid the use of NSAIDs and heparin products.

## 2023-03-12 NOTE — H&P (Signed)
 History and Physical    [name] ZOX:[id number] DOB: 20-Jan-1946 DOA: 03/12/2023 DOS: the patient was seen and examined on 03/12/2023 PCP: [name], DO  Patient coming from: Home  Chief Complaint:  Chief Complaint  Patient presents with   Abdominal Pain   HPI: Sheryl Bender is a 78 y.o. female with medical history significant of depression/anxiety, GERD, pancreatic adenocarcinoma, portal vein thrombosis/SMV thrombosis and recurrent admissions due to GI bleed.  Presented complaining of abdominal pain and having GI bleed.  No chest pain, no shortness of breath, no nausea or vomiting reported.  Patient no longer receiving any treatment for her pancreatic cancer and just recently admitted in January 2025 for TIPS and portal thrombectomy.   Patient baseline hemoglobin around 8; most recent transfusion on 03/11/2023 with adequate response with a hemoglobin of 9.4 on today's examination.  Blood work demonstrating BUN of 24 and creatinine 0.73.  The patient reports she noticed some change in her stools: Or with concern for ongoing bleeding and was instructed to presented to the emergency department for further evaluation and management.     Review of Systems: As mentioned in the history of present illness. All other systems reviewed and are negative. Past Medical History:  Diagnosis Date   Anxiety    Arthritis    Depression    Dysrhythmia    hx palpitations greater than 5 yrs -neg echo, stress per pt ? where or dr   GERD (gastroesophageal reflux disease)    occ   Nausea & vomiting 08/24/2021   Pancreatic adenocarcinoma (HCC)    Pancreatic pseudocyst    Port-A-Cath in place 09/15/2021   Pulmonary embolism [facility])    September 2022   Past Surgical History:  Procedure Laterality Date   ANTERIOR LAT LUMBAR FUSION Right 12/16/2015   Procedure: RIGHT LUMBAR TWO-THREE, LUMBAR THREE-FOUR, LUMBAR FOUR-FIVE ANTEROLATERAL LUMBAR INTERBODY FUSION;  Surgeon: Maeola Harman,  MD;  Location: [facility] OR;  Service: Neurosurgery;  Laterality: Right;   BALLOON DILATION N/A 08/27/2021   Procedure: BALLOON DILATION;  Surgeon: Meridee Score Netty Starring., MD;  Location: [facility] ENDOSCOPY;  Service: Gastroenterology;  Laterality: N/A;   BIOPSY  07/09/2021   Procedure: BIOPSY;  Surgeon: Meridee Score Netty Starring., MD;  Location: [facility] ENDOSCOPY;  Service: Gastroenterology;;   COLONOSCOPY WITH PROPOFOL N/A 12/29/2022   Procedure: COLONOSCOPY WITH PROPOFOL;  Surgeon: Corbin Ade, MD;  Location: [facility] ENDO SUITE;  Service: Endoscopy;  Laterality: N/A;   CYST GASTROSTOMY  08/27/2021   Procedure: CYST GASTROSTOMY;  Surgeon: Meridee Score Netty Starring., MD;  Location: [facility] ENDOSCOPY;  Service: Gastroenterology;;   ESOPHAGOGASTRODUODENOSCOPY N/A 07/09/2021   Procedure: ESOPHAGOGASTRODUODENOSCOPY (EGD);  Surgeon: Lemar Lofty., MD;  Location: [facility] ENDOSCOPY;  Service: Gastroenterology;  Laterality: N/A;   ESOPHAGOGASTRODUODENOSCOPY N/A 02/07/2023   Procedure: ESOPHAGOGASTRODUODENOSCOPY (EGD);  Surgeon: Imogene Burn, MD;  Location: [facility] ENDOSCOPY;  Service: Gastroenterology;  Laterality: N/A;   ESOPHAGOGASTRODUODENOSCOPY (EGD) WITH PROPOFOL N/A 08/27/2021   Procedure: ESOPHAGOGASTRODUODENOSCOPY (EGD) WITH PROPOFOL;  Surgeon: Meridee Score Netty Starring., MD;  Location: [facility] ENDOSCOPY;  Service: Gastroenterology;  Laterality: N/A;   ESOPHAGOGASTRODUODENOSCOPY (EGD) WITH PROPOFOL N/A 10/08/2021   Procedure: ESOPHAGOGASTRODUODENOSCOPY (EGD) WITH PROPOFOL;  Surgeon: Meridee Score Netty Starring., MD;  Location: [facility] ENDOSCOPY;  Service: Gastroenterology;  Laterality: N/A;   ESOPHAGOGASTRODUODENOSCOPY (EGD) WITH PROPOFOL N/A 12/29/2022   Procedure: ESOPHAGOGASTRODUODENOSCOPY (EGD) WITH PROPOFOL;  Surgeon: Corbin Ade, MD;  [facility];  Service: Endoscopy;  Laterality: N/A;   ESOPHAGOGASTRODUODENOSCOPY (EGD) WITH PROPOFOL N/A 01/13/2023   Procedure: ESOPHAGOGASTRODUODENOSCOPY (  EGD) WITH PROPOFOL;  Surgeon: Franky Macho, MD;  [facility];  Service: Endoscopy;  Laterality: N/A;   ESOPHAGOGASTRODUODENOSCOPY (EGD) WITH PROPOFOL N/A 01/30/2023   Procedure: ESOPHAGOGASTRODUODENOSCOPY (EGD) WITH PROPOFOL;  Surgeon: Lanelle Bal, DO;  [facility];  Service: Endoscopy;  Laterality: N/A;   ESOPHAGOGASTRODUODENOSCOPY (EGD) WITH PROPOFOL N/A 02/18/2023   Procedure: ESOPHAGOGASTRODUODENOSCOPY (EGD) WITH PROPOFOL;  Surgeon: Lynann Bologna, MD;  Location: [facility] ENDOSCOPY;  Service: Gastroenterology;  Laterality: N/A;   EUS N/A 07/09/2021   Procedure: UPPER ENDOSCOPIC ULTRASOUND (EUS) RADIAL;  Surgeon: Lemar Lofty., MD;  Location: [facility] ENDOSCOPY;  Service: Gastroenterology;  Laterality: N/A;   EUS N/A 08/27/2021   Procedure: UPPER ENDOSCOPIC ULTRASOUND (EUS) LINEAR;  Surgeon: Lemar Lofty., MD;  Location: [facility] ENDOSCOPY;  Service: Gastroenterology;  Laterality: N/A;   FINE NEEDLE ASPIRATION  07/09/2021   Procedure: FINE NEEDLE ASPIRATION (FNA) LINEAR;  Surgeon: Lemar Lofty., MD;  Location: [facility] ENDOSCOPY;  Service: Gastroenterology;;   HEMOSTASIS CLIP PLACEMENT  12/29/2022   Procedure: HEMOSTASIS CLIP PLACEMENT;  Surgeon: Corbin Ade, MD;  [facility];  Service: Endoscopy;;   HEMOSTASIS CLIP PLACEMENT  02/18/2023   Procedure: HEMOSTASIS CLIP PLACEMENT;  Surgeon: Lynann Bologna, MD;  Location: [facility] ENDOSCOPY;  Service: Gastroenterology;;   HEMOSTASIS CONTROL  02/18/2023   Procedure: HEMOSTASIS CONTROL;  Surgeon: Lynann Bologna, MD;  Location: [facility] ENDOSCOPY;  Service: Gastroenterology;;   HOT HEMOSTASIS  12/29/2022   Procedure: HOT HEMOSTASIS (ARGON PLASMA COAGULATION/BICAP);  Surgeon: Corbin Ade, MD;  [facility];  Service: Endoscopy;;   HOT HEMOSTASIS  01/13/2023   Procedure: HOT HEMOSTASIS (ARGON PLASMA COAGULATION/BICAP);  Surgeon: Franky Macho, MD;  [facility];  Service: Endoscopy;;  used apc and goldprobe   HOT HEMOSTASIS   01/30/2023   Procedure: HOT HEMOSTASIS (ARGON PLASMA COAGULATION/BICAP);  Surgeon: Lanelle Bal, DO;  [facility];  Service: Endoscopy;;   HOT HEMOSTASIS N/A 02/07/2023   Procedure: HOT HEMOSTASIS (ARGON PLASMA COAGULATION/BICAP);  Surgeon: Imogene Burn, MD;  Location: [facility] ENDOSCOPY;  Service: Gastroenterology;  Laterality: N/A;   HOT HEMOSTASIS N/A 02/18/2023   Procedure: HOT HEMOSTASIS (ARGON PLASMA COAGULATION/BICAP);  Surgeon: Lynann Bologna, MD;  Location: [facility] ENDOSCOPY;  Service: Gastroenterology;  Laterality: N/A;   IR EMBO VENOUS NOT HEMORR HEMANG  INC GUIDE ROADMAPPING  02/09/2023   IR IMAGING GUIDED PORT INSERTION  09/14/2021   IR RADIOLOGIST EVAL & MGMT  03/04/2023   IR THROMBECT SEC MECH MOD SED  02/23/2023   IR THROMBECT VENO MECH MOD SED  02/09/2023   IR TIPS  02/09/2023   IR TIPS REVISION MOD SED  02/23/2023   IR US GUIDE VASC ACCESS LEFT  02/23/2023   IR US GUIDE VASC ACCESS RIGHT  02/09/2023   IR US GUIDE VASC ACCESS RIGHT  02/09/2023   LAPAROSCOPY N/A 02/15/2022   Procedure: STAGING DIAGNOSTIC;  Surgeon: Fritzi Mandes, MD;  Location: [facility] OR;  Service: General;  Laterality: N/A;   LUMBAR PERCUTANEOUS PEDICLE SCREW 3 LEVEL Bilateral 12/16/2015   Procedure: PERCUTANEOUS PEDICLE SCREWS BILATERALLY AT LUMBAR TWO-FIVE;  Surgeon: Maeola Harman, MD;  Location: [facility] OR;  Service: Neurosurgery;  Laterality: Bilateral;   PANCREATIC STENT PLACEMENT  08/27/2021   Procedure: PANCREATIC STENT PLACEMENT;  Surgeon: Lemar Lofty., MD;  Location: [facility] ENDOSCOPY;  Service: Gastroenterology;;   POLYPECTOMY  12/29/2022   Procedure: POLYPECTOMY INTESTINAL;  Surgeon: Corbin Ade, MD;  [facility];  Service:  Endoscopy;;   STENT REMOVAL  10/08/2021   Procedure: STENT REMOVAL;  Surgeon: Lemar Lofty., MD;  Location: [facility] ENDOSCOPY;  Service: Gastroenterology;;   TIPS PROCEDURE N/A 02/09/2023   Procedure: TRANS-JUGULAR INTRAHEPATIC PORTAL SHUNT (TIPS);  Surgeon: Bennie Dallas, MD;  Location[facility] OR;  Service: Radiology;  Laterality: N/A;   TUBAL LIGATION  1974   WHIPPLE PROCEDURE N/A 02/15/2022   Procedure: ATTEMPTED WHIPPLE PROCEDURE;  Surgeon: Fritzi Mandes, MD;  Location[facility] OR;  Service: General;  Laterality: N/A;   Social History:  reports that she has never smoked. She has never used smokeless tobacco. She reports that she does not drink alcohol and does not use drugs.  Allergies  Allergen Reactions   Other Hives and Other (See Comments)    Cherry wood just cut- "smelled it and broke out"; cannot tolerate ANY cherry fragrances, either      Cherry Hives   Wound Dressing Adhesive Rash and Other (See Comments)    Band-Aids = local reaction    Family History  Problem Relation Age of Onset   Pancreatitis Brother        alcoholic pancreatitis   Pancreatic cancer Neg Hx    Colon cancer Neg Hx     Prior to Admission medications   Medication Sig Start Date End Date Taking? Authorizing Provider  acetaminophen (TYLENOL) 500 MG tablet Take 2 tablets (1,000 mg total) by mouth every 8 (eight) hours as needed for moderate pain, headache or fever. Patient taking differently: Take 750-1,000 mg by mouth every 8 (eight) hours as needed for moderate pain (pain score 4-6), headache or fever. 07/21/21  Yes Vassie Loll, MD  benzonatate (TESSALON) 100 MG capsule Take 1 capsule (100 mg total) by mouth 3 (three) times daily as needed for cough. 03/01/23  Yes Almon Hercules, MD  buPROPion (WELLBUTRIN XL) 300 MG 24 hr tablet Take 300 mg by mouth daily after breakfast. 06/15/21  Yes [provider]  busPIRone (BUSPAR) 10 MG tablet Take 20 mg by mouth 2 (two) times daily. 01/27/21  Yes [provider]  Cholecalciferol (VITAMIN D3) 125 MCG (5000 UT) CAPS Take 10,000 Units by mouth every morning.   Yes [provider]  escitalopram (LEXAPRO) 20 MG tablet Take 20 mg by mouth at bedtime.   Yes [provider]  lactose free nutrition (BOOST)  LIQD Take 237 mLs by mouth 2 (two) times daily between meals.   Yes [provider]  lidocaine-prilocaine (EMLA) cream Apply a small amount to port a cath site (do not rub in) and cover with plastic wrap 1 hour prior to infusion appointments 09/15/21  Yes Doreatha Massed, MD  LINZESS 72 MCG capsule TAKE 1 CAPSULE BY MOUTH DAILY BEFORE BREAKFAST Patient taking differently: Take 72 mcg by mouth every other day. In the morning 01/20/23  Yes Mansouraty, Netty Starring., MD  mirtazapine (REMERON) 30 MG tablet Take 15 mg by mouth at bedtime.   Yes [provider]  ondansetron (ZOFRAN) 4 MG tablet Take 4 mg by mouth every 8 (eight) hours as needed for nausea or vomiting.   Yes [provider]  pantoprazole (PROTONIX) 40 MG tablet Take 1 tablet (40 mg total) by mouth 2 (two) times daily. 02/02/23 05/03/23 Yes Carollee Herter, DO  QUEtiapine (SEROQUEL) 25 MG tablet Take 25 mg by mouth 2 (two) times daily. 12/14/22  Yes [provider]  sucralfate (CARAFATE) 1 g tablet Take 1 tablet (1 g total) by mouth 4 (four) times  daily -  with meals and at bedtime for 14 days. 03/01/23 03/15/23 Yes Almon Hercules, MD    Physical Exam: Vitals:   03/12/23 1400 03/12/23 1535 03/12/23 1536 03/12/23 1545  BP: 125/70 128/65  115/67  Pulse: 84 89  90  Resp: (!) 21 (!) 22  20  Temp:   98.2 F (36.8 C) 97.9 F (36.6 C)  TempSrc:   Oral Oral  SpO2: 96% 95%  97%  Weight:    59.4 kg  Height:    5\' 2"  (1.575 m)   General exam: Alert, awake, oriented x 3; in no acute distress. Respiratory system: Good air movement bilaterally; no using accessory muscle.  Good saturation on room air. Cardiovascular system:RRR.  No rubs or gallops; no JVD. Gastrointestinal system: Abdomen is nondistended, soft and nontender. No organomegaly or masses felt. Normal bowel sounds heard. Central nervous system: No focal neurological deficits. Extremities: No cyanosis or clubbing. Skin: No petechiae; Port-A-Cath in  place. Psychiatry: Mood & affect appropriate.   Data Reviewed: CBC: WBCs 4.3, hemoglobin 9.4, platelet count 187K Comprehensive metabolic panel: Sodium 140, potassium 3.9, chloride 109, bicarb 21, BUN 24, creatinine 0.73, normal LFTs and GFR >60  Assessment and Plan: GI bleed -Fecal occult test blood positive -Patient with history of pancreatic cancer, gastritis and recent IR evaluation mild varices development. -Most likely component of gastropathy/upper GI bleed. -Bowel rest with clear liquid diet -Started on IV PPI and octreotide -Continue Carafate -Follow hemoglobin trend and transfuse as needed -GI service has been consulted; unclear if any intervention will change overall outcome. -Avoid the use of NSAIDs and heparin products.  GERD (gastroesophageal reflux disease) -Continue PPI and Carafate as mentioned above. -As needed antiemetics has been ordered   Depression -No suicidal ideation hallucination -Continue treatment with Lexapro, BuSpar and Remeron.  Essential hypertension -Blood pressure stable -Continue to follow vital signs -Will be judicious with any antihypertensive in the setting of GI bleed.  Malignant neoplasm of pancreas [facility]) -Patient has stopped any further chemotherapy or treatment for her pancreatic cancer. -Palliative care has been initiated as an outpatient -Patient is DNR; wishes will be respected. -Continue as needed follow-up with oncology service.  Adynamic ileus (HCC) -With component of diarrhea -Will continue supportive care; for now holding on Linzess -As mentioned above we will provide bowel rest and allow clear liquid diet.     Advance Care Planning:   Code Status: Limited: Do not attempt resuscitation (DNR) -DNR-LIMITED -Do Not Intubate/DNI    Consults: Gastroenterology service.  Family Communication: No family at bedside.  Severity of Illness: The appropriate patient status for this patient is OBSERVATION. Observation status is  judged to be reasonable and necessary in order to provide the required intensity of service to ensure the patient's safety. The patient's presenting symptoms, physical exam findings, and initial radiographic and laboratory data in the context of their medical condition is felt to place them at decreased risk for further clinical deterioration. Furthermore, it is anticipated that the patient will be medically stable for discharge from the hospital within 2 midnights of admission.   Author: Vassie Loll, MD 03/12/2023 6:22 PM  For on call review [email or web address].

## 2023-03-12 NOTE — ED Triage Notes (Signed)
 Pt has pancreatic cancer. Pt has stopped tx

## 2023-03-12 NOTE — Assessment & Plan Note (Addendum)
-  Continue PPI and Carafate as mentioned above. -As needed antiemetics has been ordered

## 2023-03-13 DIAGNOSIS — D509 Iron deficiency anemia, unspecified: Secondary | ICD-10-CM | POA: Diagnosis not present

## 2023-03-13 DIAGNOSIS — I10 Essential (primary) hypertension: Secondary | ICD-10-CM | POA: Diagnosis not present

## 2023-03-13 DIAGNOSIS — K31819 Angiodysplasia of stomach and duodenum without bleeding: Secondary | ICD-10-CM | POA: Diagnosis not present

## 2023-03-13 DIAGNOSIS — C259 Malignant neoplasm of pancreas, unspecified: Secondary | ICD-10-CM | POA: Diagnosis not present

## 2023-03-13 DIAGNOSIS — K2100 Gastro-esophageal reflux disease with esophagitis, without bleeding: Secondary | ICD-10-CM | POA: Diagnosis not present

## 2023-03-13 DIAGNOSIS — K922 Gastrointestinal hemorrhage, unspecified: Secondary | ICD-10-CM | POA: Diagnosis not present

## 2023-03-13 DIAGNOSIS — C250 Malignant neoplasm of head of pancreas: Secondary | ICD-10-CM | POA: Diagnosis not present

## 2023-03-13 LAB — BASIC METABOLIC PANEL
Anion gap: 10 (ref 5–15)
BUN: 17 mg/dL (ref 8–23)
CO2: 21 mmol/L — ABNORMAL LOW (ref 22–32)
Calcium: 8.9 mg/dL (ref 8.9–10.3)
Chloride: 110 mmol/L (ref 98–111)
Creatinine, Ser: 0.79 mg/dL (ref 0.44–1.00)
GFR, Estimated: >60 mL/min (ref >60)
Glucose, Bld: 135 mg/dL — ABNORMAL HIGH (ref 70–99)
Potassium: 4.2 mmol/L (ref 3.5–5.1)
Sodium: 141 mmol/L (ref 135–145)

## 2023-03-13 LAB — CBC
HCT: 29.1 % — ABNORMAL LOW (ref 36.0–46.0)
Hemoglobin: 9.2 g/dL — ABNORMAL LOW (ref 12.0–15.0)
MCH: 29.5 pg (ref 26.0–34.0)
MCHC: 31.6 g/dL (ref 30.0–36.0)
MCV: 93.3 fL (ref 80.0–100.0)
Platelets: 208 10*3/uL (ref 150–400)
RBC: 3.12 MIL/uL — ABNORMAL LOW (ref 3.87–5.11)
RDW: 19.7 % — ABNORMAL HIGH (ref 11.5–15.5)
WBC: 3.7 10*3/uL — ABNORMAL LOW (ref 4.0–10.5)
nRBC: 0 % (ref 0.0–0.2)

## 2023-03-13 MED ORDER — MIRTAZAPINE 15 MG PO TABS: 15.0000 mg | ORAL_TABLET | Freq: Every day | ORAL | Status: DC

## 2023-03-13 MED ORDER — LINACLOTIDE 72 MCG PO CAPS: 72.0000 ug | ORAL_CAPSULE | ORAL | Status: DC

## 2023-03-13 MED ORDER — PANTOPRAZOLE SODIUM 40 MG PO TBEC: 40.0000 mg | DELAYED_RELEASE_TABLET | Freq: Two times a day (BID) | ORAL | 2 refills | Status: DC

## 2023-03-13 MED ORDER — BUPROPION HCL ER (XL) 300 MG PO TB24
300.0000 mg | ORAL_TABLET | Freq: Every day | ORAL | Status: DC
  Administered 2023-03-13: 300 mg via ORAL
  Filled 2023-03-13: qty 1

## 2023-03-13 MED ORDER — HEPARIN SOD (PORK) LOCK FLUSH 100 UNIT/ML IV SOLN
500.0000 [IU] | Freq: Once | INTRAVENOUS | Status: AC
  Administered 2023-03-13: 500 [IU] via INTRAVENOUS
  Filled 2023-03-13: qty 5

## 2023-03-13 MED ORDER — BUSPIRONE HCL 5 MG PO TABS
20.0000 mg | ORAL_TABLET | Freq: Two times a day (BID) | ORAL | Status: DC
  Administered 2023-03-13: 20 mg via ORAL
  Filled 2023-03-13: qty 4

## 2023-03-13 MED ORDER — SODIUM CHLORIDE 0.9% FLUSH
10.0000 mL | Freq: Two times a day (BID) | INTRAVENOUS | Status: DC
Start: 2023-03-13 — End: 2023-03-13

## 2023-03-13 MED ORDER — CHLORHEXIDINE GLUCONATE CLOTH 2 % EX PADS
6.0000 | MEDICATED_PAD | Freq: Every day | CUTANEOUS | Status: DC
  Administered 2023-03-13: 6 via TOPICAL

## 2023-03-13 MED ORDER — SODIUM CHLORIDE 0.9% FLUSH: 10.0000 mL | INTRAVENOUS | Status: DC | PRN

## 2023-03-13 MED ORDER — QUETIAPINE FUMARATE 25 MG PO TABS
25.0000 mg | ORAL_TABLET | Freq: Two times a day (BID) | ORAL | Status: DC
  Administered 2023-03-13: 25 mg via ORAL
  Filled 2023-03-13: qty 1

## 2023-03-13 MED ORDER — ESCITALOPRAM OXALATE 10 MG PO TABS: 20.0000 mg | ORAL_TABLET | Freq: Every day | ORAL | Status: DC

## 2023-03-13 NOTE — Care Management Obs Status (Signed)
 MEDICARE OBSERVATION STATUS NOTIFICATION   Patient Details  Name: Sheryl Bender MRN: [id number] Date of Birth: 12-10-1945   Medicare Observation Status Notification Given:  Yes    Elliot Gault, LCSW 03/13/2023, 2:26 PM

## 2023-03-13 NOTE — Progress Notes (Signed)
 Gastroenterology & Hepatology   Interval History: Patient reports she is feeling well, had a bowel movement not clearly able to tell me the color of it or explain a very fast black or any blood there but reports it was dark  Inpatient Medications:  Current Facility-Administered Medications:    acetaminophen (TYLENOL) tablet 650 mg, 650 mg, Oral, Q6H PRN, 650 mg at 03/12/23 2342 **OR** acetaminophen (TYLENOL) suppository 650 mg, 650 mg, Rectal, Q6H PRN, Vassie Loll, MD   buPROPion (WELLBUTRIN XL) 24 hr tablet 300 mg, 300 mg, Oral, QPC breakfast, Vassie Loll, MD, 300 mg at 03/13/23 0954   busPIRone (BUSPAR) tablet 20 mg, 20 mg, Oral, BID, Adefeso, Oladapo, DO, 20 mg at 03/13/23 0932   Chlorhexidine Gluconate Cloth 2 % PADS 6 each, 6 each, Topical, Daily, Vassie Loll, MD, 6 each at 03/13/23 1322   escitalopram (LEXAPRO) tablet 20 mg, 20 mg, Oral, QHS, Adefeso, Oladapo, DO   mirtazapine (REMERON) tablet 15 mg, 15 mg, Oral, QHS, Adefeso, Oladapo, DO   [COMPLETED] octreotide (SANDOSTATIN) 2 mcg/mL load via infusion 100 mcg, 100 mcg, Intravenous, Once, 100 mcg at 03/12/23 1754 **AND** octreotide (SANDOSTATIN) 500 mcg in sodium chloride 0.9 % 250 mL (2 mcg/mL) infusion, 50 mcg/hr, Intravenous, Continuous, Vassie Loll, MD, Last Rate: 25 mL/hr at 03/13/23 0200, 50 mcg/hr at 03/13/23 0200   ondansetron (ZOFRAN) tablet 4 mg, 4 mg, Oral, Q6H PRN **OR** ondansetron (ZOFRAN) injection 4 mg, 4 mg, Intravenous, Q6H PRN, Vassie Loll, MD   pantoprazole (PROTONIX) injection 40 mg, 40 mg, Intravenous, Q12H, Vassie Loll, MD, 40 mg at 03/13/23 0933   QUEtiapine (SEROQUEL) tablet 25 mg, 25 mg, Oral, BID, Adefeso, Oladapo, DO, 25 mg at 03/13/23 0933   sodium chloride flush (NS) 0.9 % injection 10-40 mL, 10-40 mL, Intracatheter, Q12H, Vassie Loll, MD   sodium chloride flush (NS) 0.9 % injection 10-40 mL, 10-40 mL, Intracatheter, PRN, Vassie Loll, MD   sodium chloride flush (NS) 0.9 % injection 3  mL, 3 mL, Intravenous, Q12H, Vassie Loll, MD, 3 mL at 03/13/23 0944   sodium chloride flush (NS) 0.9 % injection 3 mL, 3 mL, Intravenous, PRN, Vassie Loll, MD   sucralfate (CARAFATE) 1 GM/10ML suspension 1 g, 1 g, Oral, TID, Vassie Loll, MD, 1 g at 03/13/23 1610   I/O    Intake/Output Summary (Last 24 hours) at 03/13/2023 1454 Last data filed at 03/12/2023 2146 Gross per 24 hour  Intake 3 ml  Output --  Net 3 ml     Physical Exam: Temp:  [97.8 F (36.6 C)-98.4 F (36.9 C)] 98.2 F (36.8 C) (02/16 1315) Pulse Rate:  [77-90] 78 (02/16 1315) Resp:  [17-22] 17 (02/16 1315) BP: (100-147)/(51-67) 112/54 (02/16 1315) SpO2:  [90 %-97 %] 94 % (02/16 1315) Weight:  [59.4 kg] 59.4 kg (02/15 1545)  Temp (24hrs), Avg:98.1 F (36.7 C), Min:97.8 F (36.6 C), Max:98.4 F (36.9 C)  GENERAL: The patient is AO x3, in no acute distress. HEENT: Head is normocephalic and atraumatic. EOMI are intact. Mouth is well hydrated and without lesions. NECK: Supple. No masses LUNGS: Clear to auscultation. No presence of rhonchi/wheezing/rales. Adequate chest expansion HEART: RRR, normal s1 and s2. ABDOMEN: Soft, nontender, no guarding, no peritoneal signs, and nondistended. BS +. No masses.  Laboratory Data: CBC:     Component Value Date/Time   WBC 3.7 (L) 03/13/2023 0508   RBC 3.12 (L) 03/13/2023 0508   HGB 9.2 (L) 03/13/2023 0508   HGB 10.4 (L) 02/03/2023 1552  HCT 29.1 (L) 03/13/2023 0508   HCT 33.5 (L) 02/03/2023 1552   PLT 208 03/13/2023 0508   PLT 208 02/03/2023 1552   MCV 93.3 03/13/2023 0508   MCV 95 02/03/2023 1552   MCH 29.5 03/13/2023 0508   MCHC 31.6 03/13/2023 0508   RDW 19.7 (H) 03/13/2023 0508   RDW 15.2 02/03/2023 1552   LYMPHSABS 0.6 (L) 03/03/2023 0939   LYMPHSABS 0.9 02/03/2023 1552   MONOABS 0.3 03/03/2023 0939   EOSABS 0.1 03/03/2023 0939   EOSABS 0.0 02/03/2023 1552   BASOSABS 0.0 03/03/2023 0939   BASOSABS 0.0 02/03/2023 1552   COAG:  Lab Results   Component Value Date   INR 1.1 02/23/2023   INR 1.1 02/18/2023   INR 1.1 02/09/2023    BMP:     Latest Ref Rng & Units 03/13/2023    5:08 AM 03/12/2023   12:20 PM 02/27/2023    4:08 AM  BMP  Glucose 70 - 99 mg/dL 865  784  696   BUN 8 - 23 mg/dL 17  24  9    Creatinine 0.44 - 1.00 mg/dL 2.95  2.84  1.32   Sodium 135 - 145 mmol/L 141  140  141   Potassium 3.5 - 5.1 mmol/L 4.2  3.9  3.9   Chloride 98 - 111 mmol/L 110  109  114   CO2 22 - 32 mmol/L 21  21  22    Calcium 8.9 - 10.3 mg/dL 8.9  9.0  8.3     HEPATIC:     Latest Ref Rng & Units 03/12/2023   12:20 PM 02/27/2023    4:08 AM 02/25/2023   12:16 AM  Hepatic Function  Total Protein 6.5 - 8.1 g/dL 6.2   4.4   Albumin 3.5 - 5.0 g/dL 3.2  1.9  1.9   AST 15 - 41 U/L 39   21   ALT 0 - 44 U/L 33   20   Alk Phosphatase 38 - 126 U/L 97   75   Total Bilirubin 0.0 - 1.2 mg/dL 0.6   0.3     CARDIAC: No results found for: "CKTOTAL", "CKMB", "CKMBINDEX", "TROPONINI"    Imaging: I personally reviewed and interpreted the available labs, imaging and endoscopic files.   Assessment/Plan:  78 year old female with history of PE , IDA, AVMs, pancreatic adenocarcinoma status post chemoradiation, PVT, SMV thrombus presented with concerns of GI bleed for which GI was consulted.     Patient was last transferred to [facility] January 2025 underwent TIPS on 02/09/2023 with portal thrombectomy of the main portal vein and SMV balloon angioplasty and embolization of left gastric vein.    Repeat CT scan in January showed reocclusion of TIPS.    Patient was seen by IR and gastroenterology at [facility] and as per notes seems all options were exhausted and patient was transferred to hospice facility.    At this time unlikely patient is actively bleeding given stable hemoglobin which is actually better than before Patient with baseline hemoglobin 7-8 had a blood transfusion done 2 days ago with appropriate transfusion response to 9.4   Patient could  develop recurrent bleeding likely upper GI in etiology with persistent GAVE or gastric ulcerations, but again patient had at least 5 upper endoscopies since December 2024 which have not change the trajectory of her disease course .   At this time would manage patient conservatively with transfusion as needed and PPI  Please recall GI with any further questions  Vista Lawman, MD Gastroenterology and Hepatology [facility] Gastroenterology   This chart has been completed using [facility] Dictation software, and while attempts have been made to ensure accuracy , certain words and phrases may not be transcribed as intended

## 2023-03-13 NOTE — Discharge Summary (Signed)
 Physician Discharge Summary   [name] MRN: [id number] DOB: April 17, 1945  Admit date:     03/12/2023  Discharge date: 03/13/23  Discharge Physician: Vassie Loll   PCP: Sheryl Bellows, DO   Recommendations at discharge:  Repeat CBC to follow hemoglobin trend/stability  Discharge Diagnoses: Principal Problem:   GI bleed Active Problems:   GERD (gastroesophageal reflux disease)   Depression   Essential hypertension   Malignant neoplasm of pancreas (HCC)   Adynamic ileus [facility])  Brief Hospital admission narrative:  Sheryl Bender is a 78 y.o. female with medical history significant of depression/anxiety, GERD, pancreatic adenocarcinoma, portal vein thrombosis/SMV thrombosis and recurrent admissions due to GI bleed.  Presented complaining of abdominal pain and having GI bleed.  No chest pain, no shortness of breath, no nausea or vomiting reported.  Patient no longer receiving any treatment for her pancreatic cancer and just recently admitted in January 2025 for TIPS and portal thrombectomy.    Patient baseline hemoglobin around 8; most recent transfusion on 03/11/2023 with adequate response with a hemoglobin of 9.4 on today's examination.  Blood work demonstrating BUN of 24 and creatinine 0.73.   The patient reports she noticed some change in her stools: Or with concern for ongoing bleeding and was instructed to presented to the emergency department for further evaluation and management.  Assessment and Plan: 1-GI bleed -No chest pain, no shortness of breath, no palpitations, no hypoxia -Patient with fecal occult blood test positive from prior history of GI bleed -There is history of pancreatic cancer, gastritis and recent IR evaluation with concerns for mild varices development. -Case discussed with GI service who informed no further intervention or treatment will be offer. -Continue Carafate, PPI twice a day and comfort management. -Avoid the use of NSAIDs. -At  discharge patient hemoglobin level remained stable and there was no signs of overt bleeding throughout hospitalization.  2-GERD -Continue PPI and Carafate as mentioned above.  3-depression/anxiety -Resume home anxiolytic/antidepressant agents. -No suicidal ideation or hallucinations appreciated.  4-hypertension -Stable overall -Continue to follow vital signs -Patient advised to be judicious about sodium intake.  5-malignant neoplasm of the pancreas -With portal hypertension, SMV thrombosis, associated history of GAVE and PVT. -No further chemotherapy or treatment anticipated -Continue follow-up with oncology service and palliative care.  6-adynamic ileus -Continue the use of as needed Linzess -Maintain adequate hydration -Continue supportive care.   Consultants: GI service. Procedures performed: None  Disposition: Home Diet recommendation: Heart healthy diet.  DISCHARGE MEDICATION: Allergies as of 03/13/2023       Reactions   Other Hives, Other (See Comments)   Cherry wood just cut- "smelled it and broke out"; cannot tolerate ANY cherry fragrances, either   Cherry Hives   Wound Dressing Adhesive Rash, Other (See Comments)   Band-Aids = local reaction        Medication List     TAKE these medications    acetaminophen 500 MG tablet Commonly known as: TYLENOL Take 2 tablets (1,000 mg total) by mouth every 8 (eight) hours as needed for moderate pain, headache or fever. What changed: how much to take   benzonatate 100 MG capsule Commonly known as: TESSALON Take 1 capsule (100 mg total) by mouth 3 (three) times daily as needed for cough.   buPROPion 300 MG 24 hr tablet Commonly known as: WELLBUTRIN XL Take 300 mg by mouth daily after breakfast.   busPIRone 10 MG tablet Commonly known as: BUSPAR Take 20 mg by mouth 2 (two) times  daily.   escitalopram 20 MG tablet Commonly known as: LEXAPRO Take 20 mg by mouth at bedtime.   lactose free nutrition Liqd Take  237 mLs by mouth 2 (two) times daily between meals.   lidocaine-prilocaine cream Commonly known as: EMLA Apply a small amount to port a cath site (do not rub in) and cover with plastic wrap 1 hour prior to infusion appointments   linaclotide 72 MCG capsule Commonly known as: Linzess Take 1 capsule (72 mcg total) by mouth every other day. In the morning; hold it for diarrhea. What changed: See the new instructions.   mirtazapine 30 MG tablet Commonly known as: REMERON Take 15 mg by mouth at bedtime.   ondansetron 4 MG tablet Commonly known as: ZOFRAN Take 4 mg by mouth every 8 (eight) hours as needed for nausea or vomiting.   pantoprazole 40 MG tablet Commonly known as: PROTONIX Take 1 tablet (40 mg total) by mouth 2 (two) times daily.   QUEtiapine 25 MG tablet Commonly known as: SEROQUEL Take 25 mg by mouth 2 (two) times daily.   sucralfate 1 g tablet Commonly known as: CARAFATE Take 1 tablet (1 g total) by mouth 4 (four) times daily -  with meals and at bedtime for 14 days.   Vitamin D3 125 MCG (5000 UT) Caps Take 10,000 Units by mouth every morning.        Follow-up Information     Sheryl Bellows, DO. Schedule an appointment as soon as possible for a visit in 2 week(s).   Specialty: Family Medicine Contact information: [address] [place] [address] [phone]                Discharge Exam: Ceasar Mons Weights   03/12/23 1200 03/12/23 1545  Weight: 62 kg 59.4 kg   General exam: Chronically ill in appearance, alert, awake, oriented x 3; in no acute distress.  No overt bleeding reported. Respiratory system: Good saturation on room air. Cardiovascular system:RRR.  No rubs or gallops; no JVD. Gastrointestinal system: Abdomen is nondistended, soft and nontender. No organomegaly or masses felt. Normal bowel sounds heard. Central nervous system: No focal neurological deficits. Extremities: No cyanosis or clubbing. Skin: No petechiae; Port-A-Cath in  place. Psychiatry: Mood & affect appropriate.   Condition at discharge: Stable.  The results of significant diagnostics from this hospitalization (including imaging, microbiology, ancillary and laboratory) are listed below for reference.   Imaging Studies: IR Radiologist Eval & Mgmt Result Date: 03/04/2023 EXAM: ESTABLISHED PATIENT OFFICE VISIT CHIEF COMPLAINT: See Epic note. HISTORY OF PRESENT ILLNESS: See Epic note. REVIEW OF SYSTEMS: See Epic note. PHYSICAL EXAMINATION: See Epic note. ASSESSMENT AND PLAN: See Epic note. Marliss Coots, MD [facility] Electronically Signed   By: Marliss Coots M.D.   On: 03/04/2023 15:02   US Paracentesis Result Date: 03/04/2023 INDICATION: 78 y.o. Female. History of pancreatic adenocarcinoma. Found to have ascites. Request is for therapeutic paracentesis. EXAM: ULTRASOUND GUIDED THERAPEUTIC PARACENTESIS MEDICATIONS: Lidocaine 1 % 10 ml COMPLICATIONS: None immediate. PROCEDURE: Informed written consent was obtained from the patient after a discussion of the risks, benefits and alternatives to treatment. A timeout was performed prior to the initiation of the procedure. Initial ultrasound scanning demonstrates a small amount of ascites within the right lower abdominal quadrant. The right lower abdomen was prepped and draped in the usual sterile fashion. 1% lidocaine was used for local anesthesia. Following this, a 19 gauge, 7-cm, Yueh catheter was introduced. An ultrasound image was  saved for documentation purposes. The paracentesis was performed. The catheter was removed and a dressing was applied. The patient tolerated the procedure well without immediate post procedural complication. Patient received post-procedure intravenous albumin; see nursing notes for details. FINDINGS: A total of approximately 1.5 ml of pink colored fluid was removed. Samples were sent to the laboratory as requested by the clinical team.  IMPRESSION: Successful ultrasound-guided paracentesis yielding 1.5 liters of peritoneal fluid. Performed by Anders Grant NP Electronically Signed   By: Irish Lack M.D.   On: 03/04/2023 14:10   CT ANGIO ABD/PELVIS BRTO Result Date: 02/25/2023 CLINICAL DATA:  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 Pancreatic cancer. * Tracking Code: BO * EXAM: CTA ABDOMEN AND PELVIS WITHOUT AND WITH CONTRAST TECHNIQUE: Multidetector CT imaging of the abdomen and pelvis was performed using the standard protocol during bolus administration of intravenous contrast. Multiplanar reconstructed images and MIPs were obtained and reviewed to evaluate the vascular anatomy. RADIATION DOSE REDUCTION: This exam was performed according to the departmental dose-optimization program which includes automated exposure control, adjustment of the mA and/or kV according to patient size and/or use of iterative reconstruction technique. CONTRAST:  OMNIPAQUE IOHEXOL 350 MG/ML SOLN COMPARISON:  MRI 02/17/2023.  CT 02/06/2023. FINDINGS: VASCULAR Aorta: Thoracic aorta has a normal course and caliber with mild atherosclerotic calcified plaque. Celiac: Distal celiac is obscured by the streak artifact from the adjacent stent placement. The origin of the vessel has some minimal calcified plaque. Splenic artery and hepatic arteries are patent more distally. There intact branches of the distal left gastric. SMA: Patent without evidence of aneurysm, dissection, vasculitis or significant stenosis. Renals: Both renal arteries are patent without evidence of aneurysm, dissection, vasculitis, fibromuscular dysplasia or significant stenosis. IMA: Patent without evidence of aneurysm, dissection, vasculitis or significant stenosis. Inflow: Mild calcified plaque along the iliac vessels. Proximal Outflow: Slight calcified plaque along the common femoral and SFA vessels included in the imaging field. Veins: Preserved IVC.  There has been placement of a tips shunt extending from the IVC just above the liver through the middle hepatic vein and then into the main portal vein. The tips shunt is occluded along its course. Is also thrombosis of the portal vein. This includes the central portions of the left and right portal vein as well as the main portal vein. 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 There are some secondary and tertiary branches of the SMV which are patent. Review of the MIP images confirms the above findings. NON-VASCULAR Lower chest: Developing small bilateral pleural effusions with adjacent opacities. New small pericardial effusion. Hepatobiliary: Slightly heterogeneous appearing liver. Small benign-appearing hepatic cystic foci identified. Pancreas: Severe pancreatic atrophy with severe ductal dilatation in the neck, body and tail with mass lesion again identified at the head neck region. Mass estimated in size at 3.1 by 2.0 cm. This involves the portal venous confluence. Is also involvement of the course of the GDA. Small bubble of air within a SMV branch. This could be postprocedural. Spleen: Spleen is actually with 2 separate components, nonspecific. Please correlate for any history of remote injury or other process. Adrenals/Urinary Tract: Stable adrenal glands. Parapelvic renal cysts identified. Ureters have normal course and caliber extending down to the urinary bladder. Preserved contour to the urinary bladder. Stomach/Bowel: On this non oral contrast exam large bowel is normal course and caliber. There is some scattered stool. Small bowel is nondilated. Stomach is underdistended but there is continued wall thickening and loss of normal  fold pattern along the body antrum of the stomach. There is also some wall thickening along the proximal duodenal areas. High density intraluminal linear focus within the distal stomach. Of note there is some wall thickening along the distal esophagus with small hiatal hernia and some  varices. Lymphatic: No specific abnormal lymph node enlargement in the abdomen and pelvis. Few prominent porta hepatic nodes are identified. Reproductive: Uterus and bilateral adnexa are unremarkable. Other: Developing mild-to-moderate ascites. Musculoskeletal: Significant streak artifact from the spinal fixation hardware. Moderate multilevel degenerative changes along the spine. Scattered degenerative changes of the pelvis. IMPRESSION: Placement of a tips shunt. The shunt is occluded. Additional stents in the area of the portal venous confluence. There is also occlusion of the port main portal vein and central mid right left polar veins. There continues to be occlusion of the SMV and first order branches. Secondary and tertiary branches of the SMV appear to be open. There is some air within a branch of the SMV along the left side centrally which could be postprocedure related. Developing mild varices. Known pancreatic neoplasm with involvement of the portal venous confluence. Developing mild-to-moderate ascites, mesenteric stranding. No free intra-air. Developing bilateral pleural effusions adjacent opacities and a small pericardial effusion. Persistent wall thickening along the stomach and proximal duodenum with loss of fold pattern. No dilatation Electronically Signed   By: Karen Kays M.D.   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 EXAM: 1. Ultrasound-guided vascular access to the left internal jugular vein. 2. Selective catheterization and venography of the inferior mesenteric vein, superior mesenteric vein, and splenic veins via the indwelling  TIPS 3. Mechanical and aspiration thrombectomy of the TIPS, main portal vein, and central superior mesenteric, inferior mesenteric, and splenic veins. 4. Balloon angioplasty of the central superior mesenteric vein and splenic vein. MEDICATIONS: 8000 units heparin, intravenous ANESTHESIA/SEDATION: Moderate (conscious) sedation was employed during this procedure. A total of Versed 1 mg and Fentanyl 200 mcg was administered intravenously. Moderate Sedation Time: 72 minutes. The patient's level of consciousness and vital signs were monitored continuously by radiology nursing throughout the procedure under my direct supervision. CONTRAST:  65 mL Omnipaque 300, intravenous FLUOROSCOPY TIME:  One hundred ninety mGy COMPLICATIONS: None immediate. PROCEDURE: Informed written consent was obtained from the patient after a thorough discussion of the procedural risks, benefits and alternatives. All questions were addressed. Maximal Sterile Barrier Technique was utilized including caps, mask, sterile gowns, sterile gloves, sterile drape, hand hygiene and skin antiseptic. A timeout was performed prior to the initiation of the procedure. Preprocedure ultrasound evaluation demonstrated patency of the left internal jugular vein. The procedure was planned. Subdermal Local anesthesia was administered with 1% lidocaine at the planned needle entry site. A small skin nick was made. Under direct ultrasound visualization, the left internal jugular vein was accessed with a 21 gauge micropuncture needle. A permanent ultrasound image was captured and stored in the record. A micropuncture sheath was introduced through which a Rosen wire was directed to the inferior vena cava. A 6 French destination sheath was then inserted and positioned in the right atrium. And in the a catheter was then directed to the hepatic vein ostium of the indwelling TIPS which was  cannulated with moderate difficulty. A Rosen wire was then inserted and advanced level of  the inferior mesenteric vein. Anatomy cross catheter was then placed over the wire and directed into the mid main inferior mesenteric vein. The wire was removed and inferior mesenteric venogram was performed which min straight it hepatofugal flow with occlusion of the inferior mesenteric vein centrally. The 6 French sheath was exchanged for a 16 French, 33 cm dry seal sheath. The sheath was advanced to the hepatic vein aspect of the indwelling endograft. Mechanical thrombectomy was then performed with the Revcore device through the central inferior mesenteric vein, main portal vein, and indwelling endograft. This was followed by aspiration thrombectomy with a straight 16 [place] flowtriever. There was small volume of chronic appearing thrombus aspirated. Balloon angioplasty was then performed throughout the TIPS and into the main portal vein with a 10 mm x 80 mm Athletis balloon. Portal venogram was then performed which demonstrated an established channel a patency through the main portal vein and into the inferior mesenteric vein. A small aspect of the central superior mesenteric vein is identified. The Navicross catheter and Glidewire intra then used to cannulate the superior mesenteric vein. Mechanical thrombectomy was performed with the Revcore device through the central superior mesenteric vein into the main portal vein. Balloon angioplasty was then performed with a 6 mm x 80 mm Athletis balloon. Super mesenteric venogram was then performed which demonstrated significantly improved patency and hepatopetal flow through the superior mesenteric vein, main portal vein, and indwelling endograft. There is no evidence of reflux into the splenic vein. Therefore, the splenic vein was then cannulated and balloon angioplasty was performed with a 10 mm x 80 mm Athletis balloon. There is a type least near the portal confluence. Completion splenic venogram demonstrated 6 improved patency inflow the splenic vein however still  subchronic synechia centrally. There was a filling defect about the hepatic vein aspect of the indwelling endograft. Therefore, additional balloon angioplasty was performed centered about the hepatic vein aspect of the indwelling endograft. Completion venogram demonstrated significantly improved patency and antegrade flow through the indwelling endograft main portal vein. 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 The catheter and sheath were removed. Hemostasis was achieved with brief manual compression. A sterile bandage was applied. The patient tolerated the procedure well was transferred back to the floor in good condition. IMPRESSION: 1. Successful mechanical and aspiration thrombectomy of the TIPS, main portal vein, and central superior mesenteric, inferior mesenteric, and splenic veins. 2. Balloon angioplasty of the central superior mesenteric vein, splenic vein, and hepatic vein aspect of the indwelling endograft. 3. Completion venogram demonstrated significantly improved patency and antegrade flow through the indwelling endograft, main portal vein, and central superior mesenteric vein. 4. 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 PLAN: Recommend restarting systemic anticoagulation to preserve portal and TIPS patency. Interventional radiology will follow. Marliss Coots, MD [facility] Electronically Signed   By: Marliss Coots M.D.   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 EXAM: 1. Ultrasound-guided vascular access to the left internal jugular vein. 2. Selective  catheterization and venography of the inferior mesenteric vein, superior mesenteric vein, and splenic veins via the  indwelling TIPS 3. Mechanical and aspiration thrombectomy of the TIPS, main portal vein, and central superior mesenteric, inferior mesenteric, and splenic veins. 4. Balloon angioplasty of the central superior mesenteric vein and splenic vein. MEDICATIONS: 8000 units heparin, intravenous ANESTHESIA/SEDATION: Moderate (conscious) sedation was employed during this procedure. A total of Versed 1 mg and Fentanyl 200 mcg was administered intravenously. Moderate Sedation Time: 72 minutes. The patient's level of consciousness and vital signs were monitored continuously by radiology nursing throughout the procedure under my direct supervision. CONTRAST:  65 mL Omnipaque 300, intravenous FLUOROSCOPY TIME:  One hundred ninety mGy COMPLICATIONS: None immediate. PROCEDURE: Informed written consent was obtained from the patient after a thorough discussion of the procedural risks, benefits and alternatives. All questions were addressed. Maximal Sterile Barrier Technique was utilized including caps, mask, sterile gowns, sterile gloves, sterile drape, hand hygiene and skin antiseptic. A timeout was performed prior to the initiation of the procedure. Preprocedure ultrasound evaluation demonstrated patency of the left internal jugular vein. The procedure was planned. Subdermal Local anesthesia was administered with 1% lidocaine at the planned needle entry site. A small skin nick was made. Under direct ultrasound visualization, the left internal jugular vein was accessed with a 21 gauge micropuncture needle. A permanent ultrasound image was captured and stored in the record. A micropuncture sheath was introduced through which a Rosen wire was directed to the inferior vena cava. A 6 French destination sheath was then inserted and positioned in the right atrium. And in the a catheter was then directed to the hepatic vein  ostium of the indwelling TIPS which was cannulated with moderate difficulty. A Rosen wire was then inserted and advanced level of the inferior mesenteric vein. Anatomy cross catheter was then placed over the wire and directed into the mid main inferior mesenteric vein. The wire was removed and inferior mesenteric venogram was performed which min straight it hepatofugal flow with occlusion of the inferior mesenteric vein centrally. The 6 French sheath was exchanged for a 16 French, 33 cm dry seal sheath. The sheath was advanced to the hepatic vein aspect of the indwelling endograft. Mechanical thrombectomy was then performed with the Revcore device through the central inferior mesenteric vein, main portal vein, and indwelling endograft. This was followed by aspiration thrombectomy with a straight 16 [place] flowtriever. There was small volume of chronic appearing thrombus aspirated. Balloon angioplasty was then performed throughout the TIPS and into the main portal vein with a 10 mm x 80 mm Athletis balloon. Portal venogram was then performed which demonstrated an established channel a patency through the main portal vein and into the inferior mesenteric vein. A small aspect of the central superior mesenteric vein is identified. The Navicross catheter and Glidewire intra then used to cannulate the superior mesenteric vein. Mechanical thrombectomy was performed with the Revcore device through the central superior mesenteric vein into the main portal vein. Balloon angioplasty was then performed with a 6 mm x 80 mm Athletis balloon. Super mesenteric venogram was then performed which demonstrated significantly improved patency and hepatopetal flow through the superior mesenteric vein, main portal vein, and indwelling endograft. There is no evidence of reflux into the splenic vein. Therefore, the splenic vein was then cannulated and balloon angioplasty was performed with a 10 mm x 80 mm Athletis balloon. There is a type  least near the portal confluence. Completion splenic venogram demonstrated 6 improved patency inflow the splenic vein however still subchronic synechia centrally. There was a filling defect about the hepatic vein aspect of  the indwelling endograft. Therefore, additional balloon angioplasty was performed centered about the hepatic vein aspect of the indwelling endograft. Completion venogram demonstrated significantly improved patency and antegrade flow through the indwelling endograft main portal vein. 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 The catheter and sheath were removed. Hemostasis was achieved with brief manual compression. A sterile bandage was applied. The patient tolerated the procedure well was transferred back to the floor in good condition. IMPRESSION: 1. Successful mechanical and aspiration thrombectomy of the TIPS, main portal vein, and central superior mesenteric, inferior mesenteric, and splenic veins. 2. Balloon angioplasty of the central superior mesenteric vein, splenic vein, and hepatic vein aspect of the indwelling endograft. 3. Completion venogram demonstrated significantly improved patency and antegrade flow through the indwelling endograft, main portal vein, and central superior mesenteric vein. 4. 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 PLAN: Recommend restarting systemic anticoagulation to preserve portal and TIPS patency. Interventional radiology will follow. Marliss Coots, MD [facility] Radiology Electronically Signed   By: Marliss Coots M.D.   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 EXAM: 1. Ultrasound-guided vascular access to the left internal jugular vein. 2. Selective catheterization and venography of the inferior mesenteric vein, superior mesenteric vein, and splenic veins via the indwelling TIPS 3. Mechanical and aspiration thrombectomy of the TIPS, main portal vein, and central superior mesenteric, inferior mesenteric, and splenic veins. 4. Balloon angioplasty of the central superior mesenteric vein and splenic vein. MEDICATIONS: 8000 units heparin, intravenous ANESTHESIA/SEDATION: Moderate (conscious) sedation was employed during this procedure. A total of Versed 1 mg and Fentanyl 200 mcg was administered intravenously. Moderate Sedation Time: 72 minutes. The patient's level of consciousness and vital signs were monitored continuously by radiology nursing throughout the procedure under my direct supervision. CONTRAST:  65 mL Omnipaque 300, intravenous FLUOROSCOPY TIME:  One hundred ninety mGy COMPLICATIONS: None immediate. PROCEDURE: Informed written consent was obtained from the patient after a thorough discussion of the procedural risks, benefits and alternatives. All questions were addressed. Maximal Sterile Barrier Technique was utilized including caps, mask, sterile gowns, sterile gloves, sterile drape, hand hygiene and skin antiseptic. A timeout was performed prior to the initiation of the procedure. Preprocedure ultrasound evaluation demonstrated patency of the left internal jugular vein. The procedure was planned. Subdermal Local anesthesia was administered with 1% lidocaine at the planned needle entry site. A small skin nick was made. Under direct ultrasound visualization, the left internal jugular vein was accessed with a 21 gauge micropuncture needle. A permanent ultrasound image was captured and stored in the record. A micropuncture sheath was introduced through which a Rosen wire was directed to the inferior vena cava. A 6 French destination  sheath was then inserted and positioned in the right atrium. And in the a catheter was then directed to the hepatic vein ostium of the indwelling TIPS which was cannulated with moderate difficulty. A Rosen wire was then inserted and advanced level of the inferior mesenteric vein. Anatomy cross catheter was then placed over the wire and directed into the mid main inferior mesenteric vein. The wire was removed and inferior mesenteric venogram was performed which min straight it hepatofugal flow with occlusion of the  inferior mesenteric vein centrally. The 6 French sheath was exchanged for a 16 French, 33 cm dry seal sheath. The sheath was advanced to the hepatic vein aspect of the indwelling endograft. Mechanical thrombectomy was then performed with the Revcore device through the central inferior mesenteric vein, main portal vein, and indwelling endograft. This was followed by aspiration thrombectomy with a straight 16 [place] flowtriever. There was small volume of chronic appearing thrombus aspirated. Balloon angioplasty was then performed throughout the TIPS and into the main portal vein with a 10 mm x 80 mm Athletis balloon. Portal venogram was then performed which demonstrated an established channel a patency through the main portal vein and into the inferior mesenteric vein. A small aspect of the central superior mesenteric vein is identified. The Navicross catheter and Glidewire intra then used to cannulate the superior mesenteric vein. Mechanical thrombectomy was performed with the Revcore device through the central superior mesenteric vein into the main portal vein. Balloon angioplasty was then performed with a 6 mm x 80 mm Athletis balloon. Super mesenteric venogram was then performed which demonstrated significantly improved patency and hepatopetal flow through the superior mesenteric vein, main portal vein, and indwelling endograft. There is no evidence of reflux into the splenic vein. Therefore, the splenic  vein was then cannulated and balloon angioplasty was performed with a 10 mm x 80 mm Athletis balloon. There is a type least near the portal confluence. Completion splenic venogram demonstrated 6 improved patency inflow the splenic vein however still subchronic synechia centrally. There was a filling defect about the hepatic vein aspect of the indwelling endograft. Therefore, additional balloon angioplasty was performed centered about the hepatic vein aspect of the indwelling endograft. Completion venogram demonstrated significantly improved patency and antegrade flow through the indwelling endograft main portal vein. 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 The catheter and sheath were removed. Hemostasis was achieved with brief manual compression. A sterile bandage was applied. The patient tolerated the procedure well was transferred back to the floor in good condition. IMPRESSION: 1. Successful mechanical and aspiration thrombectomy of the TIPS, main portal vein, and central superior mesenteric, inferior mesenteric, and splenic veins. 2. Balloon angioplasty of the central superior mesenteric vein, splenic vein, and hepatic vein aspect of the indwelling endograft. 3. Completion venogram demonstrated significantly improved patency and antegrade flow through the indwelling endograft, main portal vein, and central superior mesenteric vein. 4. 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 PLAN: Recommend restarting systemic anticoagulation to preserve portal and TIPS patency. Interventional radiology will follow. Marliss Coots, MD [facility] Radiology Electronically Signed   By: Marliss Coots M.D.   On: 02/23/2023 21:22   DG Chest Port 1 View Result Date: 02/18/2023 CLINICAL DATA:  Shortness of breath EXAM: PORTABLE CHEST 1 VIEW COMPARISON:  07/13/2021 FINDINGS: Cardiac shadow is stable. Right chest wall port is now seen in satisfactory position. The  lungs are well aerated bilaterally. Patchy left perihilar opacity is noted as well as some left retrocardiac opacity most consistent with multifocal pneumonia. Minimal changes in the lateral aspect of the right base are noted as well. No bony abnormality is seen. IMPRESSION: Patchy areas of increased parenchymal opacity bilaterally consistent with multifocal pneumonia. Electronically Signed   By: Alcide Clever M.D.   On: 02/18/2023 21:47   MR Abdomen W Wo Contrast Result Date: 02/18/2023 CLINICAL DATA:  Pancreatic cancer monitoring EXAM: MRI ABDOMEN WITHOUT AND WITH CONTRAST TECHNIQUE: Multiplanar multisequence MR imaging of the abdomen was performed both  before and after the administration of intravenous contrast. CONTRAST:  6mL GADAVIST GADOBUTROL 1 MMOL/ML IV SOLN COMPARISON:  CT abdomen pelvis, 02/06/2023 FINDINGS: Lower chest: Small bilateral pleural effusions. Small hiatal hernia. Hepatobiliary: No suspicious liver lesions. Hepatic signal inversion on in and opposed phase imaging, consistent with hepatic iron deposition. No gallstones, gallbladder wall thickening, or biliary dilatation. Pancreas: No significant change in an ill-defined, infiltrative soft tissue mass of the central pancreatic head measuring 2.8 x 1.9 cm (series 4, image 20). Parenchymal atrophy and ductal dilatation of the distal pancreas, duct measuring up to 1.2 cm (series 4, image 18). Spleen: Normal in size. Accessory spleen in the left upper quadrant (series 3, image 17) Adrenals/Urinary Tract: Adrenal glands are unremarkable. Numerous bilateral parapelvic renal cysts, benign, requiring no further follow-up or characterization. Stomach/Bowel: Stomach is within normal limits. No evidence of bowel wall thickening, distention, or inflammatory changes. Vascular/Lymphatic: Aortic atherosclerosis. 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 No enlarged abdominal lymph nodes. Other: No abdominal wall hernia or abnormality. Small volume ascites, increased compared to prior examination. Musculoskeletal: No acute or significant osseous findings. IMPRESSION: 1. No significant change in an ill-defined, infiltrative soft tissue mass of the central pancreatic head measuring 2.8 x 1.9 cm, consistent with pancreatic adenocarcinoma. 2. 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 3. Small volume ascites, increased compared to prior examination. 4. Small bilateral pleural effusions. 5. Hepatic iron deposition. Electronically Signed   By: Jearld Lesch M.D.   On: 02/18/2023 14:47   MR 3D Recon At Scanner Result Date: 02/18/2023 CLINICAL DATA:  Pancreatic cancer monitoring EXAM: MRI ABDOMEN WITHOUT AND WITH CONTRAST TECHNIQUE: Multiplanar multisequence MR imaging of the abdomen was performed both before and after the administration of intravenous contrast. CONTRAST:  6mL GADAVIST GADOBUTROL 1 MMOL/ML IV SOLN COMPARISON:  CT abdomen pelvis, 02/06/2023 FINDINGS: Lower chest: Small bilateral pleural effusions. Small hiatal hernia. Hepatobiliary: No suspicious liver lesions. Hepatic signal inversion on in and opposed phase imaging, consistent with hepatic iron deposition. No gallstones, gallbladder wall thickening, or biliary dilatation. Pancreas: No significant change in an ill-defined, infiltrative soft tissue mass of the central pancreatic head measuring 2.8 x 1.9 cm (series 4, image 20). Parenchymal atrophy and ductal dilatation of the distal pancreas, duct measuring up to 1.2 cm (series 4, image 18). Spleen: Normal in size. Accessory spleen in the left upper quadrant (series 3, image 17) Adrenals/Urinary Tract: Adrenal glands are unremarkable. Numerous bilateral parapelvic renal cysts, benign, requiring no further follow-up or characterization. Stomach/Bowel: Stomach is within  normal limits. No evidence of bowel wall thickening, distention, or inflammatory changes. Vascular/Lymphatic: Aortic atherosclerosis. 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 No enlarged abdominal lymph nodes. Other: No abdominal wall hernia or abnormality. Small volume ascites, increased compared to prior examination. Musculoskeletal: No acute or significant osseous findings. IMPRESSION: 1. No significant change in an ill-defined, infiltrative soft tissue mass of the central pancreatic head measuring 2.8 x 1.9 cm, consistent with pancreatic adenocarcinoma. 2. 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 3. Small volume ascites, increased compared to prior examination. 4. Small bilateral pleural effusions. 5. Hepatic iron deposition. Electronically Signed   By: Bonna Gains.D.  On: 02/18/2023 14:47    Microbiology: Results for orders placed or performed during the hospital encounter of 08/23/21  Aerobic/Anaerobic Culture w Gram Stain (surgical/deep wound)     Status: Abnormal   Collection Time: 08/27/21  3:40 PM   Specimen: PATH GI biopsy; Body Fluid  Result Value Ref Range Status   Specimen Description   Final    FLUID Performed at [facility], [address], [place], [place] [address]    Special Requests   Final    NONE PANCREATIC PSEUDOCYST ASPIRATE Performed at [facility], [address], [place], [place] [address]    Gram Stain   Final    NO WBC SEEN RARE GRAM POSITIVE RODS FEW GRAM POSITIVE COCCI IN PAIRS    Culture (A)  Final    MULTIPLE ORGANISMS PRESENT, NONE PREDOMINANT NO ANAEROBES ISOLATED Performed at [facility], [address] [place], [place] [address]    Report Status 09/01/2021 FINAL  Final    Labs: CBC: Recent Labs  Lab  03/09/23 1415 03/12/23 1220 03/13/23 0508  WBC 3.4* 4.3 3.7*  HGB 7.7* 9.4* 9.2*  HCT 25.0* 29.5* 29.1*  MCV 93.6 92.5 93.3  PLT 225 187 208   Basic Metabolic Panel: Recent Labs  Lab 03/12/23 1220 03/13/23 0508  NA 140 141  K 3.9 4.2  CL 109 110  CO2 21* 21*  GLUCOSE 139* 135*  BUN 24* 17  CREATININE 0.73 0.79  CALCIUM 9.0 8.9   Liver Function Tests: Recent Labs  Lab 03/12/23 1220  AST 39  ALT 33  ALKPHOS 97  BILITOT 0.6  PROT 6.2*  ALBUMIN 3.2*   CBG: No results for input(s): "GLUCAP" in the last 168 hours.  Discharge time spent: greater than 30 minutes.  Signed: Vassie Loll, MD [facility] 03/13/2023

## 2023-03-13 NOTE — Progress Notes (Signed)
   03/13/23 1426  TOC Brief Assessment  Insurance and Status Reviewed  Patient has primary care physician Yes  Home environment has been reviewed from home  Prior level of function: independent  Prior/Current Home Services Current home services (private duty)  Social Drivers of Health Review SDOH reviewed no interventions necessary  Readmission risk has been reviewed Yes  Transition of care needs no transition of care needs at this time     Transition of Care Department [facility]) has reviewed patient and no TOC needs have been identified at this time. We will continue to monitor patient advancement through interdisciplinary progression rounds. If new patient transition needs arise, please place a TOC consult.

## 2023-03-17 ENCOUNTER — Encounter: Payer: Self-pay | Admitting: *Deleted

## 2023-03-17 ENCOUNTER — Encounter (INDEPENDENT_AMBULATORY_CARE_PROVIDER_SITE_OTHER): Payer: Self-pay

## 2023-03-17 ENCOUNTER — Ambulatory Visit: Payer: Medicare Other | Admitting: Internal Medicine

## 2023-03-17 DIAGNOSIS — Z9181 History of falling: Secondary | ICD-10-CM | POA: Insufficient documentation

## 2023-03-23 ENCOUNTER — Inpatient Hospital Stay: Payer: Medicare Other

## 2023-03-23 VITALS — BP 115/70 | HR 90 | Temp 98.4°F | Resp 19

## 2023-03-23 DIAGNOSIS — D62 Acute posthemorrhagic anemia: Secondary | ICD-10-CM

## 2023-03-23 DIAGNOSIS — D509 Iron deficiency anemia, unspecified: Secondary | ICD-10-CM | POA: Diagnosis not present

## 2023-03-23 LAB — CBC
HCT: 27.4 % — ABNORMAL LOW (ref 36.0–46.0)
Hemoglobin: 8.6 g/dL — ABNORMAL LOW (ref 12.0–15.0)
MCH: 30 pg (ref 26.0–34.0)
MCHC: 31.4 g/dL (ref 30.0–36.0)
MCV: 95.5 fL (ref 80.0–100.0)
Platelets: 112 10*3/uL — ABNORMAL LOW (ref 150–400)
RBC: 2.87 MIL/uL — ABNORMAL LOW (ref 3.87–5.11)
RDW: 21.1 % — ABNORMAL HIGH (ref 11.5–15.5)
WBC: 3.7 10*3/uL — ABNORMAL LOW (ref 4.0–10.5)
nRBC: 0 % (ref 0.0–0.2)

## 2023-03-23 LAB — SAMPLE TO BLOOD BANK

## 2023-03-23 MED ORDER — SODIUM CHLORIDE 0.9% FLUSH
10.0000 mL | Freq: Once | INTRAVENOUS | Status: AC
  Administered 2023-03-23: 10 mL via INTRAVENOUS

## 2023-03-23 MED ORDER — SODIUM CHLORIDE 0.9 % IV SOLN
510.0000 mg | Freq: Once | INTRAVENOUS | Status: AC
  Administered 2023-03-23: 510 mg via INTRAVENOUS
  Filled 2023-03-23: qty 510

## 2023-03-23 MED ORDER — SODIUM CHLORIDE 0.9% FLUSH
10.0000 mL | Freq: Once | INTRAVENOUS | Status: AC | PRN
  Administered 2023-03-23: 10 mL

## 2023-03-23 MED ORDER — HEPARIN SOD (PORK) LOCK FLUSH 100 UNIT/ML IV SOLN
500.0000 [IU] | Freq: Once | INTRAVENOUS | Status: AC | PRN
  Administered 2023-03-23: 500 [IU]

## 2023-03-23 MED ORDER — CETIRIZINE HCL 10 MG PO TABS
10.0000 mg | ORAL_TABLET | Freq: Once | ORAL | Status: AC
  Administered 2023-03-23: 10 mg via ORAL
  Filled 2023-03-23: qty 1

## 2023-03-23 MED ORDER — ACETAMINOPHEN 325 MG PO TABS
650.0000 mg | ORAL_TABLET | Freq: Once | ORAL | Status: AC
  Administered 2023-03-23: 650 mg via ORAL
  Filled 2023-03-23: qty 2

## 2023-03-23 MED ORDER — SODIUM CHLORIDE 0.9 % IV SOLN: INTRAVENOUS | Status: DC

## 2023-03-23 NOTE — Patient Instructions (Signed)
 [facility] [place] - A DEPT OF [facility]  Discharge Instructions: Thank you for choosing [facility] to provide your oncology and hematology care.  If you have a lab appointment with the Cancer Center - please note that after April 8th, 2024, all labs will be drawn in the cancer center.  You do not have to check in or register with the main entrance as you have in the past but will complete your check-in in the cancer center.  Wear comfortable clothing and clothing appropriate for easy access to any Portacath or PICC line.   We strive to give you quality time with your provider. You may need to reschedule your appointment if you arrive late (15 or more minutes).  Arriving late affects you and other patients whose appointments are after yours.  Also, if you miss three or more appointments without notifying the office, you may be dismissed from the clinic at the provider's discretion.      For prescription refill requests, have your pharmacy contact our office and allow 72 hours for refills to be completed.    Today you received the following:  Feraheme.  Ferumoxytol Injection What is this medication? FERUMOXYTOL (FER ue MOX i tol) treats low levels of iron in your body (iron deficiency anemia). Iron is a mineral that plays an important role in making red blood cells, which carry oxygen from your lungs to the rest of your body. This medicine may be used for other purposes; ask your health care provider or pharmacist if you have questions. COMMON BRAND NAME(S): Feraheme What should I tell my care team before I take this medication? They need to know if you have any of these conditions: Anemia not caused by low iron levels High levels of iron in the blood Magnetic resonance imaging (MRI) test scheduled An unusual or allergic reaction to iron, other medications, foods, dyes, or preservatives Pregnant or trying to get pregnant Breastfeeding How should I  use this medication? This medication is injected into a vein. It is given by your care team in a hospital or clinic setting. Talk to your care team the use of this medication in children. Special care may be needed. Overdosage: If you think you have taken too much of this medicine contact a poison control center or emergency room at once. NOTE: This medicine is only for you. Do not share this medicine with others. What if I miss a dose? It is important not to miss your dose. Call your care team if you are unable to keep an appointment. What may interact with this medication? Other iron products This list may not describe all possible interactions. Give your health care provider a list of all the medicines, herbs, non-prescription drugs, or dietary supplements you use. Also tell them if you smoke, drink alcohol, or use illegal drugs. Some items may interact with your medicine. What should I watch for while using this medication? Visit your care team for regular checks on your progress. Tell your care team if your symptoms do not start to get better or if they get worse. You may need blood work done while you are taking this medication. You may need to eat more foods that contain iron. Talk to your care team. Foods that contain iron include whole grains or cereals, dried fruits, beans, peas, leafy green vegetables, and organ meats (liver, kidney). What side effects may I notice from receiving this medication? Side effects that you should  report to your care team as soon as possible: Allergic reactions--skin rash, itching, hives, swelling of the face, lips, tongue, or throat Low blood pressure--dizziness, feeling faint or lightheaded, blurry vision Shortness of breath Side effects that usually do not require medical attention (report to your care team if they continue or are bothersome): Flushing Headache Joint pain Muscle pain Nausea Pain, redness, or irritation at injection site This list  may not describe all possible side effects. Call your doctor for medical advice about side effects. You may report side effects to FDA at 1-800-FDA-1088. Where should I keep my medication? This medication is given in a hospital or clinic. It will not be stored at home. NOTE: This sheet is a summary. It may not cover all possible information. If you have questions about this medicine, talk to your doctor, pharmacist, or health care provider.  2024 Elsevier/Gold Standard (2022-09-01 00:00:00)    To help prevent nausea and vomiting after your treatment, we encourage you to take your nausea medication as directed.  BELOW ARE SYMPTOMS THAT SHOULD BE REPORTED IMMEDIATELY: *FEVER GREATER THAN 100.4 F (38 C) OR HIGHER *CHILLS OR SWEATING *NAUSEA AND VOMITING THAT IS NOT CONTROLLED WITH YOUR NAUSEA MEDICATION *UNUSUAL SHORTNESS OF BREATH *UNUSUAL BRUISING OR BLEEDING *URINARY PROBLEMS (pain or burning when urinating, or frequent urination) *BOWEL PROBLEMS (unusual diarrhea, constipation, pain near the anus) TENDERNESS IN MOUTH AND THROAT WITH OR WITHOUT PRESENCE OF ULCERS (sore throat, sores in mouth, or a toothache) UNUSUAL RASH, SWELLING OR PAIN  UNUSUAL VAGINAL DISCHARGE OR ITCHING   Items with * indicate a potential emergency and should be followed up as soon as possible or go to the Emergency Department if any problems should occur.  Please show the CHEMOTHERAPY ALERT CARD or IMMUNOTHERAPY ALERT CARD at check-in to the Emergency Department and triage nurse.  Should you have questions after your visit or need to cancel or reschedule your appointment, please contact [facility] [place] - A DEPT OF [facility] [phone]  and follow the prompts.  Office hours are 8:00 a.m. to 4:30 p.m. Monday - Friday. Please note that voicemails left after 4:00 p.m. may not be returned until the following business day.  We are closed weekends and major holidays. You have access to a  nurse at all times for urgent questions. Please call the main number to the clinic [phone] and follow the prompts.  For any non-urgent questions, you may also contact your provider using MyChart. We now offer e-Visits for anyone 39 and older to request care online for non-urgent symptoms. For details visit [email or web address].   Also download the MyChart app! Go to the app store, search "MyChart", open the app, select [place], and log in with your MyChart username and password.

## 2023-03-23 NOTE — Progress Notes (Signed)
Patient presents today for iron infusion.  Patient is in satisfactory condition with no new complaints voiced.  Vital signs are stable.  We will proceed with infusion per provider orders.  Patient tolerated infusion well with no complaints voiced.  Patient left ambulatory in stable condition.  Vital signs stable at discharge.  Follow up as scheduled.

## 2023-03-31 ENCOUNTER — Ambulatory Visit: Payer: Medicare Other | Admitting: Internal Medicine

## 2023-03-31 ENCOUNTER — Encounter: Payer: Self-pay | Admitting: Internal Medicine

## 2023-04-06 ENCOUNTER — Inpatient Hospital Stay: Payer: Medicare Other | Attending: Hematology

## 2023-04-06 DIAGNOSIS — D62 Acute posthemorrhagic anemia: Secondary | ICD-10-CM

## 2023-04-06 DIAGNOSIS — D509 Iron deficiency anemia, unspecified: Secondary | ICD-10-CM | POA: Diagnosis not present

## 2023-04-06 DIAGNOSIS — C253 Malignant neoplasm of pancreatic duct: Secondary | ICD-10-CM | POA: Diagnosis present

## 2023-04-06 LAB — CBC
HCT: 27.9 % — ABNORMAL LOW (ref 36.0–46.0)
Hemoglobin: 8.9 g/dL — ABNORMAL LOW (ref 12.0–15.0)
MCH: 31.3 pg (ref 26.0–34.0)
MCHC: 31.9 g/dL (ref 30.0–36.0)
MCV: 98.2 fL (ref 80.0–100.0)
Platelets: 121 10*3/uL — ABNORMAL LOW (ref 150–400)
RBC: 2.84 MIL/uL — ABNORMAL LOW (ref 3.87–5.11)
RDW: 21.5 % — ABNORMAL HIGH (ref 11.5–15.5)
WBC: 4.2 10*3/uL (ref 4.0–10.5)
nRBC: 0 % (ref 0.0–0.2)

## 2023-04-06 LAB — SAMPLE TO BLOOD BANK

## 2023-04-06 MED ORDER — HEPARIN SOD (PORK) LOCK FLUSH 100 UNIT/ML IV SOLN
500.0000 [IU] | Freq: Once | INTRAVENOUS | Status: AC
  Administered 2023-04-06: 500 [IU] via INTRAVENOUS

## 2023-04-06 MED ORDER — SODIUM CHLORIDE 0.9% FLUSH
10.0000 mL | Freq: Once | INTRAVENOUS | Status: AC
  Administered 2023-04-06: 10 mL via INTRAVENOUS

## 2023-04-06 NOTE — Progress Notes (Signed)
 Patients port flushed without difficulty.  Good blood return noted with no bruising or swelling noted at site.  Band aid applied.  Labs drawn per orders. VSS with discharge and left in satisfactory condition with no s/s of distress noted. All follow ups as scheduled.       Aimy Sweeting [employer]

## 2023-04-10 ENCOUNTER — Emergency Department (HOSPITAL_COMMUNITY)

## 2023-04-10 ENCOUNTER — Inpatient Hospital Stay (HOSPITAL_COMMUNITY)
Admission: EM | Admit: 2023-04-10 | Discharge: 2023-04-26 | DRG: 871 | Disposition: E | Attending: Internal Medicine | Admitting: Internal Medicine

## 2023-04-10 ENCOUNTER — Encounter (HOSPITAL_COMMUNITY): Payer: Self-pay | Admitting: Emergency Medicine

## 2023-04-10 ENCOUNTER — Other Ambulatory Visit: Payer: Self-pay

## 2023-04-10 DIAGNOSIS — I2489 Other forms of acute ischemic heart disease: Secondary | ICD-10-CM | POA: Diagnosis present

## 2023-04-10 DIAGNOSIS — R188 Other ascites: Secondary | ICD-10-CM | POA: Diagnosis present

## 2023-04-10 DIAGNOSIS — A403 Sepsis due to Streptococcus pneumoniae: Secondary | ICD-10-CM | POA: Diagnosis not present

## 2023-04-10 DIAGNOSIS — D649 Anemia, unspecified: Secondary | ICD-10-CM

## 2023-04-10 DIAGNOSIS — J90 Pleural effusion, not elsewhere classified: Secondary | ICD-10-CM | POA: Diagnosis present

## 2023-04-10 DIAGNOSIS — R9431 Abnormal electrocardiogram [ECG] [EKG]: Secondary | ICD-10-CM | POA: Diagnosis present

## 2023-04-10 DIAGNOSIS — R64 Cachexia: Secondary | ICD-10-CM | POA: Diagnosis present

## 2023-04-10 DIAGNOSIS — Y831 Surgical operation with implant of artificial internal device as the cause of abnormal reaction of the patient, or of later complication, without mention of misadventure at the time of the procedure: Secondary | ICD-10-CM | POA: Diagnosis present

## 2023-04-10 DIAGNOSIS — J690 Pneumonitis due to inhalation of food and vomit: Secondary | ICD-10-CM | POA: Diagnosis present

## 2023-04-10 DIAGNOSIS — K922 Gastrointestinal hemorrhage, unspecified: Secondary | ICD-10-CM

## 2023-04-10 DIAGNOSIS — I2699 Other pulmonary embolism without acute cor pulmonale: Secondary | ICD-10-CM | POA: Diagnosis present

## 2023-04-10 DIAGNOSIS — Z1152 Encounter for screening for COVID-19: Secondary | ICD-10-CM | POA: Diagnosis not present

## 2023-04-10 DIAGNOSIS — K8309 Other cholangitis: Secondary | ICD-10-CM | POA: Diagnosis present

## 2023-04-10 DIAGNOSIS — R6520 Severe sepsis without septic shock: Secondary | ICD-10-CM | POA: Diagnosis present

## 2023-04-10 DIAGNOSIS — F32A Depression, unspecified: Secondary | ICD-10-CM | POA: Diagnosis present

## 2023-04-10 DIAGNOSIS — E876 Hypokalemia: Secondary | ICD-10-CM | POA: Diagnosis present

## 2023-04-10 DIAGNOSIS — J189 Pneumonia, unspecified organism: Secondary | ICD-10-CM | POA: Diagnosis not present

## 2023-04-10 DIAGNOSIS — D7589 Other specified diseases of blood and blood-forming organs: Secondary | ICD-10-CM | POA: Diagnosis present

## 2023-04-10 DIAGNOSIS — E8770 Fluid overload, unspecified: Secondary | ICD-10-CM | POA: Diagnosis present

## 2023-04-10 DIAGNOSIS — I871 Compression of vein: Secondary | ICD-10-CM | POA: Diagnosis present

## 2023-04-10 DIAGNOSIS — Z79899 Other long term (current) drug therapy: Secondary | ICD-10-CM

## 2023-04-10 DIAGNOSIS — G9341 Metabolic encephalopathy: Secondary | ICD-10-CM | POA: Diagnosis not present

## 2023-04-10 DIAGNOSIS — J159 Unspecified bacterial pneumonia: Secondary | ICD-10-CM | POA: Diagnosis present

## 2023-04-10 DIAGNOSIS — R54 Age-related physical debility: Secondary | ICD-10-CM | POA: Diagnosis present

## 2023-04-10 DIAGNOSIS — F419 Anxiety disorder, unspecified: Secondary | ICD-10-CM | POA: Diagnosis present

## 2023-04-10 DIAGNOSIS — K5909 Other constipation: Secondary | ICD-10-CM | POA: Diagnosis not present

## 2023-04-10 DIAGNOSIS — K831 Obstruction of bile duct: Secondary | ICD-10-CM | POA: Diagnosis present

## 2023-04-10 DIAGNOSIS — J9601 Acute respiratory failure with hypoxia: Secondary | ICD-10-CM | POA: Diagnosis present

## 2023-04-10 DIAGNOSIS — G893 Neoplasm related pain (acute) (chronic): Secondary | ICD-10-CM | POA: Diagnosis present

## 2023-04-10 DIAGNOSIS — C259 Malignant neoplasm of pancreas, unspecified: Secondary | ICD-10-CM

## 2023-04-10 DIAGNOSIS — A419 Sepsis, unspecified organism: Secondary | ICD-10-CM | POA: Diagnosis present

## 2023-04-10 DIAGNOSIS — Z6823 Body mass index (BMI) 23.0-23.9, adult: Secondary | ICD-10-CM

## 2023-04-10 DIAGNOSIS — Z981 Arthrodesis status: Secondary | ICD-10-CM

## 2023-04-10 DIAGNOSIS — Z515 Encounter for palliative care: Secondary | ICD-10-CM

## 2023-04-10 DIAGNOSIS — R7989 Other specified abnormal findings of blood chemistry: Secondary | ICD-10-CM

## 2023-04-10 DIAGNOSIS — T82868A Thrombosis of vascular prosthetic devices, implants and grafts, initial encounter: Secondary | ICD-10-CM | POA: Diagnosis present

## 2023-04-10 DIAGNOSIS — I81 Portal vein thrombosis: Secondary | ICD-10-CM | POA: Diagnosis present

## 2023-04-10 DIAGNOSIS — C250 Malignant neoplasm of head of pancreas: Secondary | ICD-10-CM | POA: Diagnosis present

## 2023-04-10 DIAGNOSIS — Z66 Do not resuscitate: Secondary | ICD-10-CM | POA: Diagnosis present

## 2023-04-10 DIAGNOSIS — Z8719 Personal history of other diseases of the digestive system: Secondary | ICD-10-CM

## 2023-04-10 DIAGNOSIS — D696 Thrombocytopenia, unspecified: Secondary | ICD-10-CM | POA: Diagnosis present

## 2023-04-10 DIAGNOSIS — R7401 Elevation of levels of liver transaminase levels: Secondary | ICD-10-CM | POA: Diagnosis present

## 2023-04-10 DIAGNOSIS — K219 Gastro-esophageal reflux disease without esophagitis: Secondary | ICD-10-CM | POA: Diagnosis present

## 2023-04-10 LAB — COMPREHENSIVE METABOLIC PANEL
ALT: 542 U/L — ABNORMAL HIGH (ref 0–44)
AST: 500 U/L — ABNORMAL HIGH (ref 15–41)
Albumin: 2.9 g/dL — ABNORMAL LOW (ref 3.5–5.0)
Alkaline Phosphatase: 605 U/L — ABNORMAL HIGH (ref 38–126)
Anion gap: 9 (ref 5–15)
BUN: 16 mg/dL (ref 8–23)
CO2: 19 mmol/L — ABNORMAL LOW (ref 22–32)
Calcium: 8.5 mg/dL — ABNORMAL LOW (ref 8.9–10.3)
Chloride: 107 mmol/L (ref 98–111)
Creatinine, Ser: 0.75 mg/dL (ref 0.44–1.00)
GFR, Estimated: >60 mL/min (ref >60)
Glucose, Bld: 158 mg/dL — ABNORMAL HIGH (ref 70–99)
Potassium: 3.4 mmol/L — ABNORMAL LOW (ref 3.5–5.1)
Sodium: 135 mmol/L (ref 135–145)
Total Bilirubin: 4.3 mg/dL — ABNORMAL HIGH (ref 0.0–1.2)
Total Protein: 5.4 g/dL — ABNORMAL LOW (ref 6.5–8.1)

## 2023-04-10 LAB — PROTIME-INR
INR: 1.1 (ref 0.8–1.2)
INR: 1.2 (ref 0.8–1.2)
Prothrombin Time: 14.7 s (ref 11.4–15.2)
Prothrombin Time: 15 s (ref 11.4–15.2)

## 2023-04-10 LAB — CBC
HCT: 25.6 % — ABNORMAL LOW (ref 36.0–46.0)
Hemoglobin: 8.1 g/dL — ABNORMAL LOW (ref 12.0–15.0)
MCH: 32.1 pg (ref 26.0–34.0)
MCHC: 31.6 g/dL (ref 30.0–36.0)
MCV: 101.6 fL — ABNORMAL HIGH (ref 80.0–100.0)
Platelets: 97 10*3/uL — ABNORMAL LOW (ref 150–400)
RBC: 2.52 MIL/uL — ABNORMAL LOW (ref 3.87–5.11)
RDW: 22.9 % — ABNORMAL HIGH (ref 11.5–15.5)
WBC: 6.4 10*3/uL (ref 4.0–10.5)
nRBC: 0 % (ref 0.0–0.2)

## 2023-04-10 LAB — LACTIC ACID, PLASMA
Lactic Acid, Venous: 1.9 mmol/L (ref 0.5–1.9)
Lactic Acid, Venous: 1.5 mmol/L (ref 0.5–1.9)
Lactic Acid, Venous: 1.1 mmol/L (ref 0.5–1.9)

## 2023-04-10 LAB — POC OCCULT BLOOD, ED: Fecal Occult Bld: POSITIVE — AB

## 2023-04-10 LAB — APTT
aPTT: 28 s (ref 24–36)
aPTT: 35 s (ref 24–36)

## 2023-04-10 LAB — RESP PANEL BY RT-PCR (RSV, FLU A&B, COVID)  RVPGX2
Influenza A by PCR: NEGATIVE
Influenza B by PCR: NEGATIVE
Resp Syncytial Virus by PCR: NEGATIVE
SARS Coronavirus 2 by RT PCR: NEGATIVE

## 2023-04-10 LAB — TROPONIN I (HIGH SENSITIVITY): Troponin I (High Sensitivity): 142 ng/L (ref <18)

## 2023-04-10 LAB — LIPASE, BLOOD: Lipase: 15 U/L (ref 11–51)

## 2023-04-10 LAB — PREPARE RBC (CROSSMATCH)

## 2023-04-10 MED ORDER — ONDANSETRON HCL 4 MG/2ML IJ SOLN
4.0000 mg | Freq: Four times a day (QID) | INTRAMUSCULAR | Status: DC | PRN
  Administered 2023-04-11: 4 mg via INTRAVENOUS
  Filled 2023-04-10: qty 2

## 2023-04-10 MED ORDER — SODIUM CHLORIDE 0.9 % IV SOLN
2.0000 g | INTRAVENOUS | Status: DC
  Administered 2023-04-11: 2 g via INTRAVENOUS
  Filled 2023-04-10: qty 20

## 2023-04-10 MED ORDER — ENOXAPARIN SODIUM 40 MG/0.4ML IJ SOSY
40.0000 mg | PREFILLED_SYRINGE | INTRAMUSCULAR | Status: DC
  Administered 2023-04-10: 40 mg via SUBCUTANEOUS
  Filled 2023-04-10: qty 0.4

## 2023-04-10 MED ORDER — SODIUM CHLORIDE 0.9 % IV SOLN
500.0000 mg | Freq: Once | INTRAVENOUS | Status: AC
  Administered 2023-04-10: 500 mg via INTRAVENOUS
  Filled 2023-04-10: qty 5

## 2023-04-10 MED ORDER — SODIUM CHLORIDE 0.9 % IV SOLN: INTRAVENOUS | Status: DC

## 2023-04-10 MED ORDER — SODIUM CHLORIDE 0.9 % IV SOLN
1.0000 g | Freq: Once | INTRAVENOUS | Status: AC
  Administered 2023-04-10: 1 g via INTRAVENOUS
  Filled 2023-04-10: qty 10

## 2023-04-10 MED ORDER — ONDANSETRON HCL 4 MG PO TABS: 4.0000 mg | ORAL_TABLET | Freq: Four times a day (QID) | ORAL | Status: DC | PRN

## 2023-04-10 MED ORDER — BUPROPION HCL ER (XL) 150 MG PO TB24
300.0000 mg | ORAL_TABLET | Freq: Every day | ORAL | Status: DC
  Administered 2023-04-12: 300 mg via ORAL
  Filled 2023-04-10: qty 2

## 2023-04-10 MED ORDER — LINACLOTIDE 72 MCG PO CAPS
72.0000 ug | ORAL_CAPSULE | ORAL | Status: DC
  Administered 2023-04-11: 72 ug via ORAL
  Filled 2023-04-10 (×2): qty 1

## 2023-04-10 MED ORDER — MAGNESIUM HYDROXIDE 400 MG/5ML PO SUSP: 30.0000 mL | Freq: Every day | ORAL | Status: DC | PRN

## 2023-04-10 MED ORDER — SUCRALFATE 1 G PO TABS
1.0000 g | ORAL_TABLET | Freq: Three times a day (TID) | ORAL | Status: DC
  Administered 2023-04-10 – 2023-04-12 (×6): 1 g via ORAL
  Filled 2023-04-10 (×6): qty 1

## 2023-04-10 MED ORDER — SODIUM CHLORIDE 0.9% IV SOLUTION: Freq: Once | INTRAVENOUS | Status: AC

## 2023-04-10 MED ORDER — MIRTAZAPINE 15 MG PO TABS
15.0000 mg | ORAL_TABLET | Freq: Every day | ORAL | Status: DC
  Administered 2023-04-10 – 2023-04-11 (×2): 15 mg via ORAL
  Filled 2023-04-10 (×2): qty 1

## 2023-04-10 MED ORDER — SODIUM CHLORIDE 0.9 % IV SOLN
500.0000 mg | INTRAVENOUS | Status: DC
  Administered 2023-04-11: 500 mg via INTRAVENOUS
  Filled 2023-04-10: qty 5

## 2023-04-10 MED ORDER — BENZONATATE 100 MG PO CAPS: 100.0000 mg | ORAL_CAPSULE | Freq: Three times a day (TID) | ORAL | Status: DC | PRN

## 2023-04-10 MED ORDER — TRAZODONE HCL 50 MG PO TABS
25.0000 mg | ORAL_TABLET | Freq: Every evening | ORAL | Status: DC | PRN
  Administered 2023-04-11: 25 mg via ORAL
  Filled 2023-04-10: qty 1

## 2023-04-10 MED ORDER — QUETIAPINE FUMARATE 25 MG PO TABS
25.0000 mg | ORAL_TABLET | Freq: Two times a day (BID) | ORAL | Status: DC
Start: 2023-04-10 — End: 2023-04-12
  Administered 2023-04-10 – 2023-04-11 (×2): 25 mg via ORAL
  Filled 2023-04-10: qty 2
  Filled 2023-04-10: qty 1

## 2023-04-10 MED ORDER — ACETAMINOPHEN 325 MG PO TABS
650.0000 mg | ORAL_TABLET | Freq: Four times a day (QID) | ORAL | Status: DC | PRN
  Administered 2023-04-12: 650 mg via ORAL
  Filled 2023-04-10: qty 2

## 2023-04-10 MED ORDER — BOOST PO LIQD
237.0000 mL | Freq: Two times a day (BID) | ORAL | Status: DC
  Filled 2023-04-10 (×7): qty 237

## 2023-04-10 MED ORDER — LACTATED RINGERS IV SOLN
150.0000 mL/h | INTRAVENOUS | Status: AC
  Administered 2023-04-10: 150 mL/h via INTRAVENOUS

## 2023-04-10 MED ORDER — ACETAMINOPHEN 650 MG RE SUPP: 650.0000 mg | Freq: Four times a day (QID) | RECTAL | Status: DC | PRN

## 2023-04-10 MED ORDER — VITAMIN D 25 MCG (1000 UNIT) PO TABS: 10000.0000 [IU] | ORAL_TABLET | Freq: Every morning | ORAL | Status: DC

## 2023-04-10 MED ORDER — IOHEXOL 300 MG/ML  SOLN
100.0000 mL | Freq: Once | INTRAMUSCULAR | Status: AC | PRN
  Administered 2023-04-10: 100 mL via INTRAVENOUS

## 2023-04-10 MED ORDER — LIDOCAINE-EPINEPHRINE-TETRACAINE (LET) TOPICAL GEL
3.0000 mL | Freq: Once | TOPICAL | Status: DC
  Filled 2023-04-10: qty 3

## 2023-04-10 MED ORDER — BUSPIRONE HCL 5 MG PO TABS
20.0000 mg | ORAL_TABLET | Freq: Two times a day (BID) | ORAL | Status: DC
Start: 2023-04-10 — End: 2023-04-12
  Administered 2023-04-10 – 2023-04-11 (×2): 20 mg via ORAL
  Filled 2023-04-10 (×2): qty 4

## 2023-04-10 MED ORDER — PANTOPRAZOLE SODIUM 40 MG PO TBEC
40.0000 mg | DELAYED_RELEASE_TABLET | Freq: Two times a day (BID) | ORAL | Status: DC
  Administered 2023-04-10: 40 mg via ORAL
  Filled 2023-04-10: qty 1

## 2023-04-10 MED ORDER — ESCITALOPRAM OXALATE 20 MG PO TABS
20.0000 mg | ORAL_TABLET | Freq: Every day | ORAL | Status: DC
  Administered 2023-04-10 – 2023-04-11 (×2): 20 mg via ORAL
  Filled 2023-04-10: qty 2
  Filled 2023-04-10: qty 1

## 2023-04-10 MED ORDER — LIDOCAINE-EPINEPHRINE-TETRACAINE (LET) TOPICAL GEL
3.0000 mL | Freq: Once | TOPICAL | Status: AC
  Administered 2023-04-10: 3 mL via TOPICAL

## 2023-04-10 NOTE — ED Provider Notes (Signed)
 [place] EMERGENCY DEPARTMENT AT [facility] Provider Note   CSN: [id number] Arrival date & time: 04/10/23  1342     History {Add pertinent medical, surgical, social history, OB history to HPI:1} Chief Complaint  Patient presents with   Shortness of Breath    Sheryl Bender is a 78 y.o. female with a history significant for pancreatic adenocarcinoma under the care of [name], history of pulmonary embolism is not currently anticoagulated, was admitted last month for GI bleed requiring blood transfusions, presenting today with multiple complaints, primary being increased shortness of breath over the past 4 days.  She has had a cough with clear sputum, denies fevers or chills, denies chest pain.  She does have abdominal pain but feels it is her baseline pain from her cancer.  She also endorses she is seeing a little bit of blood with bowel movements over the past several days.  She is concerned about possibly being anemic again and needing further blood transfusion.  Past history is also significant for TIPS procedure early January.  She endorses generalized fatigue.  No fevers, no nausea or vomiting.  The history is provided by the patient.       Home Medications Prior to Admission medications   Medication Sig Start Date End Date Taking? Authorizing Provider  acetaminophen (TYLENOL) 500 MG tablet Take 2 tablets (1,000 mg total) by mouth every 8 (eight) hours as needed for moderate pain, headache or fever. Patient taking differently: Take 750-1,000 mg by mouth every 8 (eight) hours as needed for moderate pain (pain score 4-6), headache or fever. 07/21/21   Vassie Loll, MD  benzonatate (TESSALON) 100 MG capsule Take 1 capsule (100 mg total) by mouth 3 (three) times daily as needed for cough. 03/01/23   Almon Hercules, MD  buPROPion (WELLBUTRIN XL) 300 MG 24 hr tablet Take 300 mg by mouth daily after breakfast. 06/15/21   [provider]  busPIRone (BUSPAR) 10 MG  tablet Take 20 mg by mouth 2 (two) times daily. 01/27/21   [provider]  Cholecalciferol (VITAMIN D3) 125 MCG (5000 UT) CAPS Take 10,000 Units by mouth every morning.    [provider]  escitalopram (LEXAPRO) 20 MG tablet Take 20 mg by mouth at bedtime.    [provider]  lactose free nutrition (BOOST) LIQD Take 237 mLs by mouth 2 (two) times daily between meals.    [provider]  lidocaine-prilocaine (EMLA) cream Apply a small amount to port a cath site (do not rub in) and cover with plastic wrap 1 hour prior to infusion appointments 09/15/21   Doreatha Massed, MD  linaclotide [facility]) 72 MCG capsule Take 1 capsule (72 mcg total) by mouth every other day. In the morning; hold it for diarrhea. 03/13/23   Vassie Loll, MD  mirtazapine (REMERON) 30 MG tablet Take 15 mg by mouth at bedtime.    [provider]  ondansetron (ZOFRAN) 4 MG tablet Take 4 mg by mouth every 8 (eight) hours as needed for nausea or vomiting.    [provider]  pantoprazole (PROTONIX) 40 MG tablet Take 1 tablet (40 mg total) by mouth 2 (two) times daily. 03/13/23   Vassie Loll, MD  QUEtiapine (SEROQUEL) 25 MG tablet Take 25 mg by mouth 2 (two) times daily. 12/14/22   [provider]  sucralfate (CARAFATE) 1 g tablet Take 1 tablet (1 g total) by mouth 4 (four) times daily -  with meals and at bedtime for 14  days. 03/01/23 03/15/23  Almon Hercules, MD      Allergies    Other, Cherry, and Wound dressing adhesive    Review of Systems   Review of Systems  Constitutional:  Negative for chills and fever.  HENT:  Negative for congestion and sore throat.   Eyes: Negative.   Respiratory:  Positive for cough and shortness of breath. Negative for chest tightness.   Cardiovascular:  Negative for chest pain.  Gastrointestinal:  Positive for abdominal pain and blood in stool. Negative for nausea and vomiting.  Genitourinary: Negative.   Musculoskeletal:  Negative  for arthralgias, joint swelling and neck pain.  Skin:  Positive for pallor. Negative for rash and wound.  Neurological:  Negative for dizziness, weakness, light-headedness, numbness and headaches.  Psychiatric/Behavioral: Negative.    All other systems reviewed and are negative.   Physical Exam Updated Vital Signs BP 116/63   Pulse 96   Temp 98.6 F (37 C)   Resp (!) 24   Ht (P) 5\' 2"  (1.575 m)   Wt (P) 58.5 kg   SpO2 91%   BMI (P) 23.59 kg/m  Physical Exam Vitals and nursing note reviewed.  Constitutional:      Appearance: She is well-developed.  HENT:     Head: Normocephalic and atraumatic.  Eyes:     Conjunctiva/sclera: Conjunctivae normal.     Comments: Conjunctival pallor  Cardiovascular:     Rate and Rhythm: Regular rhythm. Tachycardia present.     Heart sounds: Normal heart sounds.  Pulmonary:     Effort: Pulmonary effort is normal. Tachypnea present.     Breath sounds: Normal breath sounds. No stridor. No decreased breath sounds or wheezing.  Abdominal:     General: Bowel sounds are normal. There is distension.     Palpations: Abdomen is soft.     Tenderness: There is abdominal tenderness in the epigastric area.  Musculoskeletal:        General: Normal range of motion.     Cervical back: Normal range of motion.  Skin:    General: Skin is warm and dry.     Coloration: Skin is jaundiced.  Neurological:     Mental Status: She is alert.     ED Results / Procedures / Treatments   Labs (all labs ordered are listed, but only abnormal results are displayed) Labs Reviewed  CBC - Abnormal; Notable for the following components:      Result Value   RBC 2.52 (*)    Hemoglobin 8.1 (*)    HCT 25.6 (*)    MCV 101.6 (*)    RDW 22.9 (*)    Platelets 97 (*)    All other components within normal limits  COMPREHENSIVE METABOLIC PANEL - Abnormal; Notable for the following components:   Potassium 3.4 (*)    CO2 19 (*)    Glucose, Bld 158 (*)    Calcium 8.5 (*)     Total Protein 5.4 (*)    Albumin 2.9 (*)    AST 500 (*)    ALT 542 (*)    Alkaline Phosphatase 605 (*)    Total Bilirubin 4.3 (*)    All other components within normal limits  POC OCCULT BLOOD, ED - Abnormal; Notable for the following components:   Fecal Occult Bld POSITIVE (*)    All other components within normal limits  TROPONIN I (HIGH SENSITIVITY) - Abnormal; Notable for the following components:   Troponin I (High Sensitivity) 142 (*)  All other components within normal limits  RESP PANEL BY RT-PCR (RSV, FLU A&B, COVID)  [id number]  CULTURE, BLOOD (ROUTINE X 2)  CULTURE, BLOOD (ROUTINE X 2)  LIPASE, BLOOD  LACTIC ACID, PLASMA  LACTIC ACID, PLASMA  PROTIME-INR  APTT  BASIC METABOLIC PANEL  CBC  TYPE AND SCREEN  PREPARE RBC (CROSSMATCH)    EKG None  Radiology CT ABDOMEN PELVIS W CONTRAST Result Date: 04/10/2023 CLINICAL DATA:  Pancreatic cancer. Portal vein thrombosis. Thrombosed tips shunt identified on comparison CT. EXAM: CT ABDOMEN AND PELVIS WITH CONTRAST TECHNIQUE: Multidetector CT imaging of the abdomen and pelvis was performed using the standard protocol following bolus administration of intravenous contrast. RADIATION DOSE REDUCTION: This exam was performed according to the departmental dose-optimization program which includes automated exposure control, adjustment of the mA and/or kV according to patient size and/or use of iterative reconstruction technique. CONTRAST:  OMNIPAQUE IOHEXOL 300 MG/ML  SOLN COMPARISON:  CT a 02/25/2023 FINDINGS: Lower chest: Bilateral pleural effusions with associated bibasilar atelectasis. No pulmonary edema. Pneumonia. Hepatobiliary: Again demonstrated the tip shunt is thrombosed. Thrombus extends into the IVC above the tip shunt below the atrium (image 5/series 5.) the portal veins are thrombosed. There is a moderate volume of free fluid along the margin of the liver similar comparison exam. Small amount of fluid along the LEFT and  RIGHT pericolic gutter which collects the pelvis volume ascites similar to prior CT normal volume spleen Pancreas: Pancreas demonstrates chronic dilatation of the duct and atrophy. There is a mass the pancreas unchanged measuring 18 mm image 29/5. Spleen: Normal volume spleen Adrenals/urinary tract: Adrenal glands and kidneys are normal. The ureters and bladder normal. Stomach/Bowel: Stomach, small bowel, appendix, and cecum are normal. The colon and rectosigmoid colon are normal. Vascular/Lymphatic: Aorta is normal. Thrombosis of the portal vein as above. Reproductive: Uterus unremarkable Other: Moderate volume of free fluid/ascites similar to comparison CT Musculoskeletal: No aggressive osseous lesion. IMPRESSION: 1. No significant change from CT 02/25/2023. 2. 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 3. Moderate to large volume ascites not changed in volume. 4. Pancreatic head mass unchanged. 5. No evidence of bowel obstruction. 6. Bilateral pleural effusions bibasilar atelectasis. No evidence of pneumonia. Electronically Signed   By: Genevive Bi M.D.   On: 04/10/2023 19:11   DG Chest Portable 1 View Result Date: 04/10/2023 CLINICAL DATA:  Short of breath EXAM: PORTABLE CHEST 1 VIEW COMPARISON:  None Available. FINDINGS: Port in the anterior chest wall with tip in distal SVC. Density of the RIGHT cardiophrenic angle. LEFT basilar atelectasis. Upper lungs are clear. No pneumothorax. IMPRESSION: Medial RIGHT lower alobe density representing pneumonia, atelectasis or aspiration pneumonitis. Electronically Signed   By: Genevive Bi M.D.   On: 04/10/2023 15:38    Procedures Procedures  {Document cardiac monitor, telemetry assessment procedure when appropriate:1}  Medications Ordered in ED Medications  buPROPion (WELLBUTRIN XL) 24 hr tablet 300 mg (has no administration in time range)  busPIRone (BUSPAR) tablet 20 mg (has no administration in time range)  escitalopram  (LEXAPRO) tablet 20 mg (20 mg Oral Given 04/10/23 2151)  mirtazapine (REMERON) tablet 15 mg (15 mg Oral Given 04/10/23 2151)  QUEtiapine (SEROQUEL) tablet 25 mg (has no administration in time range)  linaclotide (LINZESS) capsule 72 mcg (has no administration in time range)  pantoprazole (PROTONIX) EC tablet 40 mg (40 mg Oral Given 04/10/23 2151)  sucralfate (CARAFATE) tablet 1 g (1 g Oral Given 04/10/23 2151)  cholecalciferol (VITAMIN D3) 25  MCG (1000 UNIT) tablet 10,000 Units (has no administration in time range)  lactose free nutrition (Boost) liquid 237 mL (has no administration in time range)  benzonatate (TESSALON) capsule 100 mg (has no administration in time range)  enoxaparin (LOVENOX) injection 40 mg (40 mg Subcutaneous Given 04/10/23 2151)  0.9 %  sodium chloride infusion ( Intravenous New Bag/Given 04/10/23 2153)  acetaminophen (TYLENOL) tablet 650 mg (has no administration in time range)    Or  acetaminophen (TYLENOL) suppository 650 mg (has no administration in time range)  traZODone (DESYREL) tablet 25 mg (has no administration in time range)  magnesium hydroxide (MILK OF MAGNESIA) suspension 30 mL (has no administration in time range)  ondansetron (ZOFRAN) tablet 4 mg (has no administration in time range)    Or  ondansetron (ZOFRAN) injection 4 mg (has no administration in time range)  lidocaine-EPINEPHrine-tetracaine (LET) topical gel (3 mLs Topical Given 04/10/23 1500)  cefTRIAXone (ROCEPHIN) 1 g in sodium chloride 0.9 % 100 mL IVPB (0 g Intravenous Stopped 04/10/23 1750)  azithromycin (ZITHROMAX) 500 mg in sodium chloride 0.9 % 250 mL IVPB (0 mg Intravenous Stopped 04/10/23 1904)  0.9 %  sodium chloride infusion (Manually program via Guardrails IV Fluids) ( Intravenous New Bag/Given 04/10/23 1713)  iohexol (OMNIPAQUE) 300 MG/ML solution 100 mL (100 mLs Intravenous Contrast Given 04/10/23 1825)    ED Course/ Medical Decision Making/ A&P   {   Click here for ABCD2, HEART and other  calculatorsREFRESH Note before signing :1}                              Medical Decision Making Patient presenting with increased shortness of breath along with cough, denies fever but has generalized fatigue.  States symptoms are similar with prior episodes of worsened anemia.  She does have some abdominal pain as well but feels this is her baseline pain from her pancreatic cancer.  She has seen a little bit of pink tinge on the toilet paper with bowel movements the past several days, no gross visible blood, was recently admitted for GI bleed requiring transfusions.  She is under the care of [name] for her cancer, she had a TIPS placed early January and required revision secondary to clotting during admission in February.  Chest x-ray today suggests right lower lobe pneumonia.  This would certainly explain her presenting hypoxia and respiratory symptoms.  She is very pale but also jaundiced, significant elevation in her LFTs today raising concern for possible repeat  TIPS complication.    Amount and/or Complexity of Data Reviewed Labs: ordered.    Details: Significant labs including hemoglobin of 8.1 which is relatively stable in comparison, however with her other medical problems and being Hemoccult positive I have ordered 1 unit of PRBCs.  Additionally she has a significant elevation in her LFTs, her AST is 500, ALT 542, alk phos 605 and total bilirubin of 4.3 lactic acid is normal range, respiratory panel is negative.  Her initial troponin is 142, were currently pending her second troponin, of note she has no chest pain. Radiology: ordered.    Details: Chest x-ray suggest right lower lobe pneumonia.  Rocephin and Zithromax have been ordered.  CT reviewed, no liver mets, persistent thrombus of the portal vein and TIPS shunt, moderate to large ascites essentially unchanged from prior study.  No bowel obstruction. Discussion of management or test interpretation with external provider(s): Call  placed to the hospitalist for admission.  Discussed with Dr. Mariea Clonts who requested GI consult for recommendations prior to admission as patient may need to be admitted at [facility].  I discussed this patient with Dr. Jena Gauss who recommended she get his CT abdomen pelvis now to rule out hepatic metastatic disease or any other obvious reason for elevation in her LFTs.  If there is no metastatic disease, this patient will need a Doppler study of the TIPS shunt as it probably is obstructed.  CT skin completed, no liver mets but there are still thrombus present essentially unchanged from prior imaging.  Discussed with hospitalist Dr. Mertie Moores who will admit patient and arrange for a bed at [facility], he has asked for a formal GI consult at [facility] as they will need to be involved in her care.  Call placed to [facility], Dr. Marina Goodell.  Currently pending callback.  Risk Decision regarding hospitalization.     {Document critical care time when appropriate:1} {Document review of labs and clinical decision tools ie heart score, Chads2Vasc2 etc:1}  {Document your independent review of radiology images, and any outside records:1} {Document your discussion with family members, caretakers, and with consultants:1} {Document social determinants of health affecting pt's care:1} {Document your decision making why or why not admission, treatments were needed:1} Final Clinical Impression(s) / ED Diagnoses Final diagnoses:  Community acquired pneumonia of right lower lobe of lung  Elevated transaminase level  Gastrointestinal hemorrhage, unspecified gastrointestinal hemorrhage type  Symptomatic anemia    Rx / DC Orders ED Discharge Orders     None

## 2023-04-10 NOTE — ED Notes (Signed)
 Pt care taken, no complaints at this time, waiting for bed assignment

## 2023-04-10 NOTE — H&P (Incomplete)
 Sheryl Bender   PATIENT NAME: Sheryl Bender    MR#:  [id number]  DATE OF BIRTH:  February 03, 1945  DATE OF ADMISSION:  04/10/2023  PRIMARY CARE PHYSICIAN: [name], DO   Patient is coming from: Home  REQUESTING/REFERRING PHYSICIAN: Victoriano Lain  CHIEF COMPLAINT:   Chief Complaint  Patient presents with  . Shortness of Breath    HISTORY OF PRESENT ILLNESS:  Sheryl Bender is a 78 y.o. female with medical history significant for pancreatic adenocarcinoma followed by [name], anxiety, depression, osteoarthritis, GERD, and PE on no current anticoagulation was recently admitted last month for GI bleeding requiring packed red blood cells transfusion, who presented to the emergency room with acute onset of generalized weakness and malaise with associated upper abdominal pain that she graded 5/10 in severity.  She denied any fever or chills.  She admits to nausea without vomiting.  No dysuria, oliguria or hematuria or flank pain.  She admits to cough over the last 9 to 10 days productive of clear sputum with associated dyspnea without wheezing.  No dysuria, oliguria or hematuria or flank pain.  ED Course: When she came to the ER, vital signs revealed a heart rate of 111 and respiratory rate of 22 with pulse oximetry of 91% on 2 L of O2 per nasal cannula with otherwise normal vital signs.  Labs revealed mild hypokalemia 3.4 and CO2 of 19 with blood glucose 158 and calcium 8.5.  Alk phos was 605 and AST 500 and ALT 542 with total protein 5.4 albumin 2.9 with total bili 4.3, new compared to 03/12/2023.  High sensitive troponin I was 142.  Lactic acid was 1.9.  CBC showed anemia with hemoglobin 8.1 hematocrit 25.6 with macrocytosis and thrombocytopenia of 97.  Stool Hemoccult came back positive. EKG as reviewed by me : Normal sinus rhythm with rate of 93 with low voltage QRS and prolonged QT interval with QTc of 507 MS. Imaging: Portable chest x-ray showed medial right lower lobe  density representing pneumonia, atelectasis or aspiration pneumonitis. - Abdominal pelvic CT scan revealed the following: 1. No significant change from CT 02/25/2023. 2. 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 3. Moderate to large volume ascites not changed in volume. 4. Pancreatic head mass unchanged. 5. No evidence of bowel obstruction. 6. Bilateral pleural effusions bibasilar atelectasis. No evidence of pneumonia.  The patient was given IV Rocephin and Zithromax.  The case was initially discussed with Dr. Jena Gauss who recommended the abdominal and pelvic CT scan to rule out hepatic metastasis and investigate her elevated LFTs.  She will need a Doppler study of the TIPS shunt.  She will be admitted to a medical telemetry bed at [facility] for further evaluation and management. PAST MEDICAL HISTORY:   Past Medical History:  Diagnosis Date  . Anxiety   . Arthritis   . Depression   . Dysrhythmia    hx palpitations greater than 5 yrs -neg echo, stress per pt ? where or dr  . GERD (gastroesophageal reflux disease)    occ  . Nausea & vomiting 08/24/2021  . Pancreatic adenocarcinoma (HCC)   . Pancreatic pseudocyst   . Port-A-Cath in place 09/15/2021  . Pulmonary embolism [facility])    September 2022    PAST SURGICAL HISTORY:   Past Surgical History:  Procedure Laterality Date  . ANTERIOR LAT LUMBAR FUSION Right 12/16/2015   Procedure: RIGHT LUMBAR TWO-THREE, LUMBAR THREE-FOUR, LUMBAR FOUR-FIVE ANTEROLATERAL LUMBAR INTERBODY FUSION;  Surgeon:  Maeola Harman, MD;  Location: [facility] OR;  Service: Neurosurgery;  Laterality: Right;  . BALLOON DILATION N/A 08/27/2021   Procedure: BALLOON DILATION;  Surgeon: Meridee Score Netty Starring., MD;  Location: [facility] ENDOSCOPY;  Service: Gastroenterology;  Laterality: N/A;  . BIOPSY  07/09/2021   Procedure: BIOPSY;  Surgeon: Meridee Score Netty Starring., MD;  Location: [facility] ENDOSCOPY;  Service: Gastroenterology;;  . COLONOSCOPY WITH PROPOFOL N/A 12/29/2022    Procedure: COLONOSCOPY WITH PROPOFOL;  Surgeon: Corbin Ade, MD;  [facility]O [facility];  Service: Endoscopy;  Laterality: N/A;  . CYST GASTROSTOMY  08/27/2021   Procedure: CYST GASTROSTOMY;  Surgeon: Meridee Score Netty Starring., MD;  Location: [facility] ENDOSCOPY;  Service: Gastroenterology;;  . ESOPHAGOGASTRODUODENOSCOPY N/A 07/09/2021   Procedure: ESOPHAGOGASTRODUODENOSCOPY (EGD);  Surgeon: Lemar Lofty., MD;  Location: [facility] ENDOSCOPY;  Service: Gastroenterology;  Laterality: N/A;  . ESOPHAGOGASTRODUODENOSCOPY N/A 02/07/2023   Procedure: ESOPHAGOGASTRODUODENOSCOPY (EGD);  Surgeon: Imogene Burn, MD;  Location: [facility] ENDOSCOPY;  Service: Gastroenterology;  Laterality: N/A;  . ESOPHAGOGASTRODUODENOSCOPY (EGD) WITH PROPOFOL N/A 08/27/2021   Procedure: ESOPHAGOGASTRODUODENOSCOPY (EGD) WITH PROPOFOL;  Surgeon: Meridee Score Netty Starring., MD;  Location: [facility] ENDOSCOPY;  Service: Gastroenterology;  Laterality: N/A;  . ESOPHAGOGASTRODUODENOSCOPY (EGD) WITH PROPOFOL N/A 10/08/2021   Procedure: ESOPHAGOGASTRODUODENOSCOPY (EGD) WITH PROPOFOL;  Surgeon: Meridee Score Netty Starring., MD;  Location: [facility] ENDOSCOPY;  Service: Gastroenterology;  Laterality: N/A;  . ESOPHAGOGASTRODUODENOSCOPY (EGD) WITH PROPOFOL N/A 12/29/2022   Procedure: ESOPHAGOGASTRODUODENOSCOPY (EGD) WITH PROPOFOL;  Surgeon: Corbin Ade, MD;  [facility];  Service: Endoscopy;  Laterality: N/A;  . ESOPHAGOGASTRODUODENOSCOPY (EGD) WITH PROPOFOL N/A 01/13/2023   Procedure: ESOPHAGOGASTRODUODENOSCOPY (EGD) WITH PROPOFOL;  Surgeon: Franky Macho, MD;  [facility];  Service: Endoscopy;  Laterality: N/A;  . ESOPHAGOGASTRODUODENOSCOPY (EGD) WITH PROPOFOL N/A 01/30/2023   Procedure: ESOPHAGOGASTRODUODENOSCOPY (EGD) WITH PROPOFOL;  Surgeon: Lanelle Bal, DO;  [facility];  Service: Endoscopy;  Laterality: N/A;  . ESOPHAGOGASTRODUODENOSCOPY (EGD) WITH PROPOFOL N/A 02/18/2023   Procedure: ESOPHAGOGASTRODUODENOSCOPY (EGD)  WITH PROPOFOL;  Surgeon: Lynann Bologna, MD;  Location: [facility] ENDOSCOPY;  Service: Gastroenterology;  Laterality: N/A;  . EUS N/A 07/09/2021   Procedure: UPPER ENDOSCOPIC ULTRASOUND (EUS) RADIAL;  Surgeon: Meridee Score Netty Starring., MD;  Location: [facility] ENDOSCOPY;  Service: Gastroenterology;  Laterality: N/A;  . EUS N/A 08/27/2021   Procedure: UPPER ENDOSCOPIC ULTRASOUND (EUS) LINEAR;  Surgeon: Lemar Lofty., MD;  Location: [facility] ENDOSCOPY;  Service: Gastroenterology;  Laterality: N/A;  . FINE NEEDLE ASPIRATION  07/09/2021   Procedure: FINE NEEDLE ASPIRATION (FNA) LINEAR;  Surgeon: Lemar Lofty., MD;  Location: [facility] ENDOSCOPY;  Service: Gastroenterology;;  . HEMOSTASIS CLIP PLACEMENT  12/29/2022   Procedure: HEMOSTASIS CLIP PLACEMENT;  Surgeon: Corbin Ade, MD;  [facility];  Service: Endoscopy;;  . HEMOSTASIS CLIP PLACEMENT  02/18/2023   Procedure: HEMOSTASIS CLIP PLACEMENT;  Surgeon: Lynann Bologna, MD;  Location: [facility] ENDOSCOPY;  Service: Gastroenterology;;  . HEMOSTASIS CONTROL  02/18/2023   Procedure: HEMOSTASIS CONTROL;  Surgeon: Lynann Bologna, MD;  Location: [facility] ENDOSCOPY;  Service: Gastroenterology;;  . HOT HEMOSTASIS  12/29/2022   Procedure: HOT HEMOSTASIS (ARGON PLASMA COAGULATION/BICAP);  Surgeon: Corbin Ade, MD;  [facility];  Service: Endoscopy;;  . HOT HEMOSTASIS  01/13/2023   Procedure: HOT HEMOSTASIS (ARGON PLASMA COAGULATION/BICAP);  Surgeon: Franky Macho, MD;  [facility];  Service: Endoscopy;;  used apc and goldprobe  . HOT HEMOSTASIS  01/30/2023   Procedure: HOT HEMOSTASIS (ARGON PLASMA COAGULATION/BICAP);  Surgeon: Lanelle Bal, DO;  [facility];  Service: Endoscopy;;  . HOT HEMOSTASIS N/A 02/07/2023   Procedure: HOT HEMOSTASIS (ARGON PLASMA COAGULATION/BICAP);  Surgeon: Imogene Burn, MD;  Location: [facility] ENDOSCOPY;  Service: Gastroenterology;  Laterality: N/A;  . HOT HEMOSTASIS N/A 02/18/2023   Procedure: HOT  HEMOSTASIS (ARGON PLASMA COAGULATION/BICAP);  Surgeon: Lynann Bologna, MD;  Location: [facility] ENDOSCOPY;  Service: Gastroenterology;  Laterality: N/A;  . IR EMBO VENOUS NOT HEMORR HEMANG  INC GUIDE ROADMAPPING  02/09/2023  . IR IMAGING GUIDED PORT INSERTION  09/14/2021  . IR RADIOLOGIST EVAL & MGMT  03/04/2023  . IR THROMBECT SEC MECH MOD SED  02/23/2023  . IR THROMBECT VENO MECH MOD SED  02/09/2023  . IR TIPS  02/09/2023  . IR TIPS REVISION MOD SED  02/23/2023  . IR US GUIDE VASC ACCESS LEFT  02/23/2023  . IR US GUIDE VASC ACCESS RIGHT  02/09/2023  . IR US GUIDE VASC ACCESS RIGHT  02/09/2023  . LAPAROSCOPY N/A 02/15/2022   Procedure: STAGING DIAGNOSTIC;  Surgeon: Fritzi Mandes, MD;  Location: [facility] OR;  Service: General;  Laterality: N/A;  . LUMBAR PERCUTANEOUS PEDICLE SCREW 3 LEVEL Bilateral 12/16/2015   Procedure: PERCUTANEOUS PEDICLE SCREWS BILATERALLY AT LUMBAR TWO-FIVE;  Surgeon: Maeola Harman, MD;  Location: [facility] OR;  Service: Neurosurgery;  Laterality: Bilateral;  . PANCREATIC STENT PLACEMENT  08/27/2021   Procedure: PANCREATIC STENT PLACEMENT;  Surgeon: Lemar Lofty., MD;  Location: [facility] ENDOSCOPY;  Service: Gastroenterology;;  . POLYPECTOMY  12/29/2022   Procedure: POLYPECTOMY INTESTINAL;  Surgeon: Corbin Ade, MD;  [facility];  Service: Endoscopy;;  . STENT REMOVAL  10/08/2021   Procedure: STENT REMOVAL;  Surgeon: Lemar Lofty., MD;  Location: [facility] ENDOSCOPY;  Service: Gastroenterology;;  . TIPS PROCEDURE N/A 02/09/2023   Procedure: TRANS-JUGULAR INTRAHEPATIC PORTAL SHUNT (TIPS);  Surgeon: Bennie Dallas, MD;  Location: [facility] OR;  Service: Radiology;  Laterality: N/A;  . TUBAL LIGATION  1974  . WHIPPLE PROCEDURE N/A 02/15/2022   Procedure: ATTEMPTED WHIPPLE PROCEDURE;  Surgeon: Fritzi Mandes, MD;  Location: [facility] OR;  Service: General;  Laterality: N/A;    SOCIAL HISTORY:   Social History   Tobacco Use  . Smoking status: Never  . Smokeless tobacco: Never  Substance Use  Topics  . Alcohol use: No    FAMILY HISTORY:   Family History  Problem Relation Age of Onset  . Pancreatitis Brother        alcoholic pancreatitis  . Pancreatic cancer Neg Hx   . Colon cancer Neg Hx     DRUG ALLERGIES:   Allergies  Allergen Reactions  . Other Hives and Other (See Comments)    Cherry wood just cut- "smelled it and broke out"; cannot tolerate ANY cherry fragrances, either     . Cherry Hives  . Wound Dressing Adhesive Rash and Other (See Comments)    Band-Aids = local reaction    REVIEW OF SYSTEMS:   ROS As per history of present illness. All pertinent systems were reviewed above. Constitutional, HEENT, cardiovascular, respiratory, GI, GU, musculoskeletal, neuro, psychiatric, endocrine, integumentary and hematologic systems were reviewed and are otherwise negative/unremarkable except for positive findings mentioned above in the HPI.   MEDICATIONS AT HOME:   Prior to Admission medications   Medication Sig Start Date End Date Taking? Authorizing Provider  acetaminophen (TYLENOL) 500 MG tablet Take 2 tablets (1,000 mg total) by mouth every 8 (eight) hours as needed for moderate pain, headache or fever. Patient taking differently:  Take 750-1,000 mg by mouth every 8 (eight) hours as needed for moderate pain (pain score 4-6), headache or fever. 07/21/21   Vassie Loll, MD  benzonatate (TESSALON) 100 MG capsule Take 1 capsule (100 mg total) by mouth 3 (three) times daily as needed for cough. 03/01/23   Almon Hercules, MD  buPROPion (WELLBUTRIN XL) 300 MG 24 hr tablet Take 300 mg by mouth daily after breakfast. 06/15/21   [provider]  busPIRone (BUSPAR) 10 MG tablet Take 20 mg by mouth 2 (two) times daily. 01/27/21   [provider]  Cholecalciferol (VITAMIN D3) 125 MCG (5000 UT) CAPS Take 10,000 Units by mouth every morning.    [provider]  escitalopram (LEXAPRO) 20 MG tablet Take 20 mg by mouth at bedtime.    [provider]   lactose free nutrition (BOOST) LIQD Take 237 mLs by mouth 2 (two) times daily between meals.    [provider]  lidocaine-prilocaine (EMLA) cream Apply a small amount to port a cath site (do not rub in) and cover with plastic wrap 1 hour prior to infusion appointments 09/15/21   Doreatha Massed, MD  linaclotide [facility]) 72 MCG capsule Take 1 capsule (72 mcg total) by mouth every other day. In the morning; hold it for diarrhea. 03/13/23   Vassie Loll, MD  mirtazapine (REMERON) 30 MG tablet Take 15 mg by mouth at bedtime.    [provider]  ondansetron (ZOFRAN) 4 MG tablet Take 4 mg by mouth every 8 (eight) hours as needed for nausea or vomiting.    [provider]  pantoprazole (PROTONIX) 40 MG tablet Take 1 tablet (40 mg total) by mouth 2 (two) times daily. 03/13/23   Vassie Loll, MD  QUEtiapine (SEROQUEL) 25 MG tablet Take 25 mg by mouth 2 (two) times daily. 12/14/22   [provider]  sucralfate (CARAFATE) 1 g tablet Take 1 tablet (1 g total) by mouth 4 (four) times daily -  with meals and at bedtime for 14 days. 03/01/23 03/15/23  Almon Hercules, MD      VITAL SIGNS:  Blood pressure 106/89, pulse (!) 101, temperature 98.2 F (36.8 C), resp. rate 20, height (P) 5\' 2"  (1.575 m), weight (P) 58.5 kg, SpO2 91%.  PHYSICAL EXAMINATION:  Physical Exam  GENERAL:  78 y.o.-year-old Caucasian female patient lying in the bed with no acute distress.  EYES: Pupils equal, round, reactive to light and accommodation. No scleral icterus. Extraocular muscles intact.  HEENT: Head atraumatic, normocephalic. Oropharynx and nasopharynx clear.  NECK:  Supple, no jugular venous distention. No thyroid enlargement, no tenderness.  LUNGS: Slightly diminished bibasal breath sounds with right basal crackles.  No use of accessory muscles of respiration.  CARDIOVASCULAR: Regular rate and rhythm, S1, S2 normal. No murmurs, rubs, or gallops.  ABDOMEN: Soft, nondistended,  nontender. Bowel sounds present. No organomegaly or mass.  EXTREMITIES: No pedal edema, cyanosis, or clubbing.  NEUROLOGIC: Cranial nerves II through XII are intact. Muscle strength 5/5 in all extremities. Sensation intact. Gait not checked.  PSYCHIATRIC: The patient is alert and oriented x 3.  Normal affect and good eye contact. SKIN: No obvious rash, lesion, or ulcer.   LABORATORY PANEL:   CBC Recent Labs  Lab 04/10/23 1522  WBC 6.4  HGB 8.1*  HCT 25.6*  PLT 97*   ------------------------------------------------------------------------------------------------------------------  Chemistries  Recent Labs  Lab 04/10/23 1522  NA 135  K 3.4*  CL 107  CO2 19*  GLUCOSE 158*  BUN  16  CREATININE 0.75  CALCIUM 8.5*  AST 500*  ALT 542*  ALKPHOS 605*  BILITOT 4.3*   ------------------------------------------------------------------------------------------------------------------  Cardiac Enzymes No results for input(s): "TROPONINI" in the last 168 hours. ------------------------------------------------------------------------------------------------------------------  RADIOLOGY:  CT ABDOMEN PELVIS W CONTRAST Result Date: 04/10/2023 CLINICAL DATA:  Pancreatic cancer. Portal vein thrombosis. Thrombosed tips shunt identified on comparison CT. EXAM: CT ABDOMEN AND PELVIS WITH CONTRAST TECHNIQUE: Multidetector CT imaging of the abdomen and pelvis was performed using the standard protocol following bolus administration of intravenous contrast. RADIATION DOSE REDUCTION: This exam was performed according to the departmental dose-optimization program which includes automated exposure control, adjustment of the mA and/or kV according to patient size and/or use of iterative reconstruction technique. CONTRAST:  OMNIPAQUE IOHEXOL 300 MG/ML  SOLN COMPARISON:  CT a 02/25/2023 FINDINGS: Lower chest: Bilateral pleural effusions with associated bibasilar atelectasis. No pulmonary edema.  Pneumonia. Hepatobiliary: Again demonstrated the tip shunt is thrombosed. Thrombus extends into the IVC above the tip shunt below the atrium (image 5/series 5.) the portal veins are thrombosed. There is a moderate volume of free fluid along the margin of the liver similar comparison exam. Small amount of fluid along the LEFT and RIGHT pericolic gutter which collects the pelvis volume ascites similar to prior CT normal volume spleen Pancreas: Pancreas demonstrates chronic dilatation of the duct and atrophy. There is a mass the pancreas unchanged measuring 18 mm image 29/5. Spleen: Normal volume spleen Adrenals/urinary tract: Adrenal glands and kidneys are normal. The ureters and bladder normal. Stomach/Bowel: Stomach, small bowel, appendix, and cecum are normal. The colon and rectosigmoid colon are normal. Vascular/Lymphatic: Aorta is normal. Thrombosis of the portal vein as above. Reproductive: Uterus unremarkable Other: Moderate volume of free fluid/ascites similar to comparison CT Musculoskeletal: No aggressive osseous lesion. IMPRESSION: 1. No significant change from CT 02/25/2023. 2. 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 3. Moderate to large volume ascites not changed in volume. 4. Pancreatic head mass unchanged. 5. No evidence of bowel obstruction. 6. Bilateral pleural effusions bibasilar atelectasis. No evidence of pneumonia. Electronically Signed   By: Genevive Bi M.D.   On: 04/10/2023 19:11   DG Chest Portable 1 View Result Date: 04/10/2023 CLINICAL DATA:  Short of breath EXAM: PORTABLE CHEST 1 VIEW COMPARISON:  None Available. FINDINGS: Port in the anterior chest wall with tip in distal SVC. Density of the RIGHT cardiophrenic angle. LEFT basilar atelectasis. Upper lungs are clear. No pneumothorax. IMPRESSION: Medial RIGHT lower alobe density representing pneumonia, atelectasis or aspiration pneumonitis. Electronically Signed   By: Genevive Bi M.D.   On: 04/10/2023  15:38      IMPRESSION AND PLAN:  Assessment and Plan: * Obstructive jaundice - This is like secondary to persistent occlusion/thrombosis of the portal veins and her TIPS shunt obstruction with pancreatic head adenocarcinoma. - The patient will be admitted to a medical telemetry bed at [facility]. - We will follow LFTs. - The patient will be hydrated with IV normal saline. - Pain management will be provided. - The patient will need a GI consultation in AM. - She will need Doppler of her TIPS shunt. - She will also need oncology follow-up.  Sepsis due to pneumonia (HCC) - Sepsis manifested by tachycardia and tachypnea. - Will continue antibiotic therapy with IV Rocephin and Zithromax. - Mucolytic therapy be provided as well as duo nebs q.i.d. and q.4 hours p.r.n. - We will follow blood cultures. -O2 protocol will be followed given borderline pulse oximetry.  Symptomatic anemia - We will follow serial hemoglobins and hematocrits. - This is associated with positive stool Hemoccult indicating an element of GI bleeding. - GI consultation will be obtained. - Dr. Marina Goodell was paged about the patient. - We will follow serial hemoglobins and hematocrits. - We will place her on IV PPI therapy.  Elevated troponin - We have also troponins. - She has no chest pain. - This could be likely related to demand ischemia.  Anxiety and depression - We will continue BuSpar, Wellbutrin XL and Lexapro as well as Seroquel and Remeron..   DVT prophylaxis: SCDs. Advanced Care Planning:  Code Status: The patient is DNR and DNI. Family Communication:  The plan of care was discussed in details with the patient (and family). I answered all questions. The patient agreed to proceed with the above mentioned plan. Further management will depend upon hospital course. Disposition Plan: Back to previous home environment Consults called: GI consult and oncology consult will need to be called in AM. All the records  are reviewed and case discussed with ED provider.  Status is: Inpatient   At the time of the admission, it appears that the appropriate admission status for this patient is inpatient.  This is judged to be reasonable and necessary in order to provide the required intensity of service to ensure the patient's safety given the presenting symptoms, physical exam findings and initial radiographic and laboratory data in the context of comorbid conditions.  The patient requires inpatient status due to high intensity of service, high risk of further deterioration and high frequency of surveillance required.  I certify that at the time of admission, it is my clinical judgment that the patient will require inpatient hospital care extending more than 2 midnights.                            Dispo: The patient is from: Home              Anticipated d/c is to: Home              Patient currently is not medically stable to d/c.              Difficult to place patient: No  Hannah Beat M.D on 04/11/2023 at 2:43 AM  [facility]   From 7 PM-7 AM, contact night-coverage [email or web address]  CC: Primary care physician; [name], DO

## 2023-04-10 NOTE — ED Notes (Signed)
 ED Provider at bedside.

## 2023-04-10 NOTE — ED Triage Notes (Signed)
 Pt bib pov w/ c/o sob x 4 days. Pt is yellowish pale in triage. Pt reports dizziness but no changes in vision. Pt currently has stage 1 pancreatic cancer that is inoperable. Pt also has chronic issues with stable hemoglobin and iron levels. Pt is tachyphemic in triage with an o2 sat of 84 on RA.

## 2023-04-10 NOTE — ED Notes (Signed)
 Poc occult blood positive, PA-C aware

## 2023-04-10 NOTE — ED Notes (Signed)
 [facility] Dr Marina Goodell paged to PA Idol

## 2023-04-10 NOTE — ED Notes (Signed)
 [facility] has been paged 3x spoke with answering service with no response back

## 2023-04-11 DIAGNOSIS — D649 Anemia, unspecified: Secondary | ICD-10-CM | POA: Diagnosis not present

## 2023-04-11 DIAGNOSIS — R7401 Elevation of levels of liver transaminase levels: Secondary | ICD-10-CM

## 2023-04-11 DIAGNOSIS — F419 Anxiety disorder, unspecified: Secondary | ICD-10-CM

## 2023-04-11 DIAGNOSIS — K922 Gastrointestinal hemorrhage, unspecified: Secondary | ICD-10-CM | POA: Diagnosis not present

## 2023-04-11 DIAGNOSIS — C250 Malignant neoplasm of head of pancreas: Secondary | ICD-10-CM | POA: Diagnosis not present

## 2023-04-11 DIAGNOSIS — K5909 Other constipation: Secondary | ICD-10-CM

## 2023-04-11 DIAGNOSIS — R7989 Other specified abnormal findings of blood chemistry: Secondary | ICD-10-CM

## 2023-04-11 DIAGNOSIS — A403 Sepsis due to Streptococcus pneumoniae: Secondary | ICD-10-CM

## 2023-04-11 DIAGNOSIS — A419 Sepsis, unspecified organism: Secondary | ICD-10-CM

## 2023-04-11 DIAGNOSIS — K831 Obstruction of bile duct: Secondary | ICD-10-CM | POA: Diagnosis not present

## 2023-04-11 LAB — BASIC METABOLIC PANEL
Anion gap: 5 (ref 5–15)
BUN: 13 mg/dL (ref 8–23)
CO2: 23 mmol/L (ref 22–32)
Calcium: 8.2 mg/dL — ABNORMAL LOW (ref 8.9–10.3)
Chloride: 109 mmol/L (ref 98–111)
Creatinine, Ser: 0.68 mg/dL (ref 0.44–1.00)
GFR, Estimated: >60 mL/min (ref >60)
Glucose, Bld: 108 mg/dL — ABNORMAL HIGH (ref 70–99)
Potassium: 3.5 mmol/L (ref 3.5–5.1)
Sodium: 137 mmol/L (ref 135–145)

## 2023-04-11 LAB — CBC
HCT: 22.2 % — ABNORMAL LOW (ref 36.0–46.0)
Hemoglobin: 7 g/dL — ABNORMAL LOW (ref 12.0–15.0)
MCH: 32.4 pg (ref 26.0–34.0)
MCHC: 31.5 g/dL (ref 30.0–36.0)
MCV: 102.8 fL — ABNORMAL HIGH (ref 80.0–100.0)
Platelets: 75 10*3/uL — ABNORMAL LOW (ref 150–400)
RBC: 2.16 MIL/uL — ABNORMAL LOW (ref 3.87–5.11)
RDW: 23.3 % — ABNORMAL HIGH (ref 11.5–15.5)
WBC: 4.6 10*3/uL (ref 4.0–10.5)
nRBC: 0 % (ref 0.0–0.2)

## 2023-04-11 LAB — TROPONIN I (HIGH SENSITIVITY)
Troponin I (High Sensitivity): 73 ng/L — ABNORMAL HIGH (ref <18)
Troponin I (High Sensitivity): 78 ng/L — ABNORMAL HIGH (ref <18)

## 2023-04-11 MED ORDER — IPRATROPIUM-ALBUTEROL 0.5-2.5 (3) MG/3ML IN SOLN
3.0000 mL | Freq: Four times a day (QID) | RESPIRATORY_TRACT | Status: DC
  Administered 2023-04-11 (×2): 3 mL via RESPIRATORY_TRACT
  Filled 2023-04-11 (×2): qty 3

## 2023-04-11 MED ORDER — SODIUM CHLORIDE 0.9% IV SOLUTION: Freq: Once | INTRAVENOUS | Status: DC

## 2023-04-11 MED ORDER — HYDROCOD POLI-CHLORPHE POLI ER 10-8 MG/5ML PO SUER
5.0000 mL | Freq: Two times a day (BID) | ORAL | Status: DC | PRN
  Administered 2023-04-11 – 2023-04-12 (×2): 5 mL via ORAL
  Filled 2023-04-11 (×2): qty 5

## 2023-04-11 MED ORDER — CHLORHEXIDINE GLUCONATE CLOTH 2 % EX PADS
6.0000 | MEDICATED_PAD | Freq: Every day | CUTANEOUS | Status: DC
  Administered 2023-04-11: 6 via TOPICAL

## 2023-04-11 MED ORDER — GUAIFENESIN ER 600 MG PO TB12
600.0000 mg | ORAL_TABLET | Freq: Two times a day (BID) | ORAL | Status: DC
  Administered 2023-04-11 (×2): 600 mg via ORAL
  Filled 2023-04-11 (×2): qty 1

## 2023-04-11 MED ORDER — STERILE WATER FOR INJECTION IJ SOLN
INTRAMUSCULAR | Status: AC
  Administered 2023-04-11: 10 mL
  Filled 2023-04-11: qty 10

## 2023-04-11 MED ORDER — SODIUM CHLORIDE 0.9% FLUSH: 10.0000 mL | INTRAVENOUS | Status: DC | PRN

## 2023-04-11 MED ORDER — MORPHINE SULFATE (PF) 2 MG/ML IV SOLN
2.0000 mg | INTRAVENOUS | Status: DC | PRN
  Administered 2023-04-12 (×2): 2 mg via INTRAVENOUS
  Filled 2023-04-11 (×2): qty 1

## 2023-04-11 MED ORDER — SODIUM CHLORIDE 0.9% FLUSH
10.0000 mL | Freq: Two times a day (BID) | INTRAVENOUS | Status: DC
  Administered 2023-04-12: 10 mL

## 2023-04-11 MED ORDER — PANTOPRAZOLE SODIUM 40 MG IV SOLR
40.0000 mg | Freq: Two times a day (BID) | INTRAVENOUS | Status: DC
  Administered 2023-04-11 – 2023-04-12 (×3): 40 mg via INTRAVENOUS
  Filled 2023-04-11 (×3): qty 10

## 2023-04-11 MED ORDER — IPRATROPIUM-ALBUTEROL 0.5-2.5 (3) MG/3ML IN SOLN
3.0000 mL | RESPIRATORY_TRACT | Status: DC | PRN
  Administered 2023-04-12: 3 mL via RESPIRATORY_TRACT
  Filled 2023-04-11: qty 3

## 2023-04-11 NOTE — Progress Notes (Signed)
 Johann Capers NP [facility] was notified of order to transfuse if HGB <7 HGB was 7.0 at 0456 in quire of what is need at this time.

## 2023-04-11 NOTE — Assessment & Plan Note (Addendum)
-   Sepsis manifested by tachycardia and tachypnea. - Will continue antibiotic therapy with IV Rocephin and Zithromax. - Mucolytic therapy be provided as well as duo nebs q.i.d. and q.4 hours p.r.n. - We will follow blood cultures. -O2 protocol will be followed given borderline pulse oximetry.

## 2023-04-11 NOTE — Assessment & Plan Note (Addendum)
-   This is like secondary to persistent occlusion/thrombosis of the portal veins and her TIPS shunt obstruction with pancreatic head adenocarcinoma. - The patient will be admitted to a medical telemetry bed at [facility]. - We will follow LFTs. - The patient will be hydrated with IV normal saline. - Pain management will be provided. - The patient will need a GI consultation in AM. - She will need Doppler of her TIPS shunt. - She will also need oncology follow-up.

## 2023-04-11 NOTE — Progress Notes (Signed)
 [facility] PROGRESS NOTE    Progress Note  Sheryl Bender  HYQ:[id number] DOB: 07/24/1945 DOA: 04/10/2023 PCP: [name], DO     Brief Narrative:   Sheryl Bender is an 78 y.o. female has medical history significant for pancreatic cancer not on chemotherapy followed by [name], osteoarthritis and PE not on anticoagulation recently admitted last month for GI bleed which required packed red blood cell transfusion presents to the emergency room for weakness associated with abdominal pain.  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 GI saw the patient and again recommended transfer to [facility]. GI was consulted recommended no further interventions. They recommended hospice to get involved.  She is being followed by palliative care as an outpatient.  Sepsis due to pneumonia (HCC)/acute respiratory failure with hypoxia: She relates she is short of breath she has bilateral pleural effusions and CT scan She was tachycardic and tachypneic could be due to pneumonia and/or pain. She will start empirically on IV Rocephin. Blood cultures were sent. She was started on oxygen.  Normocytic anemia: Will go ahead and transfuse 1 unit of packed red blood cells. Check a CBC posttransfusion.  Elevated troponins: She denies any chest pain likely demand ischemia.  Anxiety and depression: Continue BuSpar Wellbutrin and Lexapro.  As well as Seroquel and Remeron.  Thrombocytopenia: Patient has been drifting down since last month. She has a history of GI bleed.  Continue to monitor platelet  count.   DVT prophylaxis: scd Family Communication:none Status is: Inpatient Remains inpatient appropriate because: Shortness of breath    Code Status:     Code Status Orders  (From admission, onward)           Start     Ordered   04/10/23 2102  Do not attempt resuscitation (DNR)- Limited -Do Not Intubate (DNI)  Continuous       Question Answer Comment  If pulseless and not breathing No CPR or chest compressions.   In Pre-Arrest Conditions (Patient Is Breathing and Has A Pulse) Do not intubate. Provide all appropriate non-invasive medical interventions. Avoid ICU transfer unless indicated or required.   Consent: Discussion documented in EHR or advanced directives reviewed      04/10/23 2102           Code Status History     Date Active Date Inactive Code Status Order ID Comments User Context   03/12/2023 1435 03/13/2023 2221 Limited: Do not attempt resuscitation (DNR) -DNR-LIMITED -Do Not Intubate/DNI  [id number]  Vassie Loll, MD ED   02/17/2023 2241 03/01/2023 1721 Limited: Do not attempt resuscitation (DNR) -DNR-LIMITED -Do Not Intubate/DNI  [id number]  Nolberto Hanlon, MD ED   02/08/2023 1720 02/13/2023 1806 Limited: Do not attempt resuscitation (DNR) -DNR-LIMITED -Do Not Intubate/DNI  [id number]  Anice Paganini, NP Inpatient   02/06/2023 1929 02/08/2023 1720 Full Code [id number]  Anselm Jungling, DO ED   01/29/2023 1812 02/02/2023 1527 Full Code [id number]  Onnie Boer, MD Inpatient   01/12/2023 1448 01/15/2023 1719 Full Code [id number]  Burnadette Pop, MD ED   12/27/2022 2031 12/31/2022 1703 Full Code [id number]  Dorcas Carrow, MD ED   05/06/2022 [id number]  05/09/2022 1959 Full Code [id number]  Lilyan Gilford, DO ED   02/15/2022 1126 02/19/2022 1614 Full Code [id number]  Fritzi Mandes, MD Inpatient   08/23/2021 2013 08/31/2021 2141 Full Code [id number]  Angie Fava DO Inpatient   07/14/2021 0656 07/21/2021 2329 Full Code [id number]  Frankey Shown, DO Inpatient   07/13/2021 2320  07/14/2021 0656 DNR [id number]  Lilyan Gilford, DO ED   12/16/2015 1607 12/19/2015 2050 Full Code [id number]  Maeola Harman, MD Inpatient         IV Access:   Peripheral IV   Procedures and diagnostic studies:   CT ABDOMEN PELVIS W CONTRAST Result Date: 04/10/2023 CLINICAL DATA:  Pancreatic cancer. Portal vein thrombosis. Thrombosed tips shunt identified on comparison CT. EXAM: CT ABDOMEN AND PELVIS WITH CONTRAST TECHNIQUE: Multidetector CT imaging of the abdomen and pelvis was performed using the standard protocol following bolus administration of intravenous contrast. RADIATION DOSE REDUCTION: This exam was performed according to the departmental dose-optimization program which includes automated exposure control, adjustment of the mA and/or kV according to patient size and/or use of iterative reconstruction technique. CONTRAST:  OMNIPAQUE IOHEXOL 300 MG/ML  SOLN COMPARISON:  CT a 02/25/2023 FINDINGS: Lower chest: Bilateral pleural effusions with associated bibasilar atelectasis. No pulmonary edema. Pneumonia. Hepatobiliary: Again demonstrated the tip shunt is thrombosed. Thrombus extends into the IVC above the tip shunt below the atrium (image 5/series 5.) the portal veins are thrombosed. There is a moderate volume of free fluid along the margin of the liver similar comparison exam. Small amount of fluid along the LEFT and RIGHT pericolic gutter which collects the pelvis volume ascites similar to prior CT normal volume spleen Pancreas: Pancreas demonstrates chronic dilatation of the duct and atrophy. There is a mass the pancreas unchanged measuring 18 mm image 29/5. Spleen: Normal volume spleen Adrenals/urinary tract: Adrenal glands and kidneys are normal. The ureters and bladder normal. Stomach/Bowel: Stomach, small bowel, appendix, and cecum are normal. The colon and rectosigmoid colon are normal. Vascular/Lymphatic: Aorta is normal. Thrombosis of the portal vein as above. Reproductive:  Uterus unremarkable Other: Moderate volume of free fluid/ascites similar to comparison CT Musculoskeletal: No aggressive osseous lesion. IMPRESSION: 1. No significant change from CT 02/25/2023. 2. 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 3. Moderate to large volume ascites not changed in volume. 4. Pancreatic head mass unchanged. 5. No evidence of bowel obstruction. 6. Bilateral pleural effusions bibasilar atelectasis. No evidence of pneumonia. Electronically Signed   By: Genevive Bi M.D.   On: 04/10/2023 19:11   DG Chest Portable 1 View Result Date: 04/10/2023 CLINICAL DATA:  Short of breath EXAM: PORTABLE CHEST 1 VIEW COMPARISON:  None Available. FINDINGS: Port in the anterior chest wall with tip in distal SVC. Density of the RIGHT cardiophrenic angle. LEFT basilar atelectasis. Upper lungs are clear. No pneumothorax. IMPRESSION: Medial RIGHT lower alobe density representing pneumonia, atelectasis or aspiration pneumonitis. Electronically Signed   By: Genevive Bi M.D.   On: 04/10/2023 15:38     Medical Consultants:   None.   Subjective:    Weyman Pedro she relates her shortness of breath is about the same.  Objective:    Vitals:   04/11/23 0807 04/11/23 0846 04/11/23 0848 04/11/23 0922  BP: 111/61   (!) 100/59  Pulse: 91   95  Resp: 19   16  Temp: 98.3 F (36.8 C)  98 F (36.7 C) 98.4 F (36.9 C)  TempSrc: Oral   Oral  SpO2: 93% 100%  95%  Weight:      Height:       SpO2: 95 % O2 Flow Rate (L/min): 4 L/min   Intake/Output Summary (Last 24 hours) at 04/11/2023 1032 Last data filed at 04/10/2023 1750 Gross per 24 hour  Intake 100 ml  Output --  Net 100 ml   Filed Weights   04/10/23 1414 04/10/23 1817  Weight: 59 kg (P) 58.5 kg    Exam: General exam: In no acute distress. Respiratory system: Good air movement and clear to auscultation. Cardiovascular system: S1 & S2 heard, RRR. No JVD. Gastrointestinal system: Abdomen is  nondistended, soft and nontender.  Extremities: No pedal edema. Skin: No rashes, lesions or ulcers Psychiatry: Judgement and insight appear normal. Mood & affect appropriate.    Data Reviewed:    Labs: Basic Metabolic Panel: Recent Labs  Lab 04/10/23 1522 04/11/23 0456  NA 135 137  K 3.4* 3.5  CL 107 109  CO2 19* 23  GLUCOSE 158* 108*  BUN 16 13  CREATININE 0.75 0.68  CALCIUM 8.5* 8.2*   GFR Estimated Creatinine Clearance: 45.1 mL/min (by C-G formula based on SCr of 0.68 mg/dL). Liver Function Tests: Recent Labs  Lab 04/10/23 1522  AST 500*  ALT 542*  ALKPHOS 605*  BILITOT 4.3*  PROT 5.4*  ALBUMIN 2.9*   Recent Labs  Lab 04/10/23 1522  LIPASE 15   No results for input(s): "AMMONIA" in the last 168 hours. Coagulation profile Recent Labs  Lab 04/10/23 1706 04/10/23 2251  INR 1.1 1.2   COVID-19 Labs  No results for input(s): "DDIMER", "FERRITIN", "LDH", "CRP" in the last 72 hours.  Lab Results  Component Value Date   SARSCOV2NAA NEGATIVE 04/10/2023    CBC: Recent Labs  Lab 04/06/23 1439 04/10/23 1522 04/11/23 0456  WBC 4.2 6.4 4.6  HGB 8.9* 8.1* 7.0*  HCT 27.9* 25.6* 22.2*  MCV 98.2 101.6* 102.8*  PLT 121* 97* 75*   Cardiac Enzymes: No results for input(s): "CKTOTAL", "CKMB", "CKMBINDEX", "TROPONINI" in the last 168 hours. BNP (last 3 results) No results for input(s): "PROBNP" in the last 8760 hours. CBG: No results for input(s): "GLUCAP" in the last 168 hours. D-Dimer: No results for input(s): "DDIMER" in the last 72 hours. Hgb A1c: No results for input(s): "HGBA1C" in the last 72 hours. Lipid Profile: No results for input(s): "CHOL", "HDL", "LDLCALC", "TRIG", "CHOLHDL", "LDLDIRECT" in the last 72 hours. Thyroid function studies: No results for input(s): "TSH", "T4TOTAL", "T3FREE", "THYROIDAB" in the last 72 hours.  Invalid input(s): "FREET3" Anemia work up: No results for input(s): "VITAMINB12", "FOLATE", "FERRITIN", "TIBC",  "IRON", "RETICCTPCT" in the last 72 hours. Sepsis Labs: Recent Labs  Lab 04/06/23 1439 04/10/23 1522 04/10/23 1706 04/10/23 2251 04/11/23 0456  WBC 4.2 6.4  --   --  4.6  LATICACIDVEN  --  1.9 1.5 1.1  --    Microbiology Recent Results (from the past 240 hours)  Resp panel by RT-PCR (RSV, Flu A&B, Covid) Anterior Nasal Swab     Status: None   Collection Time: 04/10/23  2:20 PM   Specimen: Anterior Nasal Swab  Result Value Ref Range Status   SARS Coronavirus 2 by RT PCR NEGATIVE NEGATIVE Final    Comment: (NOTE) SARS-CoV-2 target nucleic acids are NOT DETECTED.  The SARS-CoV-2 RNA is generally detectable in upper respiratory specimens during the acute phase of infection. The lowest concentration of SARS-CoV-2 viral copies this assay can detect is 138 copies/mL. A negative result  does not preclude SARS-Cov-2 infection and should not be used as the sole basis for treatment or other patient management decisions. A negative result may occur with  improper specimen collection/handling, submission of specimen other than nasopharyngeal swab, presence of viral mutation(s) within the areas targeted by this assay, and inadequate number of viral copies(<138 copies/mL). A negative result must be combined with clinical observations, patient history, and epidemiological information. The expected result is Negative.  Fact Sheet for Patients:  BloggerCourse.com  Fact Sheet for Healthcare Providers:  SeriousBroker.it  This test is no t yet approved or cleared by the [place] FDA and  has been authorized for detection and/or diagnosis of SARS-CoV-2 by FDA under an Emergency Use Authorization (EUA). This EUA will remain  in effect (meaning this test can be used) for the duration of the COVID-19 declaration under Section 564(b)(1) of the Act, 21 U.S.C.section 360bbb-3(b)(1), unless the authorization is terminated  or revoked sooner.        Influenza A by PCR NEGATIVE NEGATIVE Final   Influenza B by PCR NEGATIVE NEGATIVE Final    Comment: (NOTE) The Xpert Xpress SARS-CoV-2/FLU/RSV plus assay is intended as an aid in the diagnosis of influenza from Nasopharyngeal swab specimens and should not be used as a sole basis for treatment. Nasal washings and aspirates are unacceptable for Xpert Xpress SARS-CoV-2/FLU/RSV testing.  Fact Sheet for Patients: BloggerCourse.com  Fact Sheet for Healthcare Providers: SeriousBroker.it  This test is not yet approved or cleared by the [place] FDA and has been authorized for detection and/or diagnosis of SARS-CoV-2 by FDA under an Emergency Use Authorization (EUA). This EUA will remain in effect (meaning this test can be used) for the duration of the COVID-19 declaration under Section 564(b)(1) of the Act, 21 U.S.C. section 360bbb-3(b)(1), unless the authorization is terminated or revoked.     Resp Syncytial Virus by PCR NEGATIVE NEGATIVE Final    Comment: (NOTE) Fact Sheet for Patients: BloggerCourse.com  Fact Sheet for Healthcare Providers: SeriousBroker.it  This test is not yet approved or cleared by the [place] FDA and has been authorized for detection and/or diagnosis of SARS-CoV-2 by FDA under an Emergency Use Authorization (EUA). This EUA will remain in effect (meaning this test can be used) for the duration of the COVID-19 declaration under Section 564(b)(1) of the Act, 21 U.S.C. section 360bbb-3(b)(1), unless the authorization is terminated or revoked.  Performed at [facility], [address] [place], [place] [address]   Blood culture (routine x 2)     Status: None (Preliminary result)   Collection Time: 04/10/23  3:22 PM   Specimen: BLOOD  Result Value Ref Range Status   Specimen Description BLOOD LEFT ANTECUBITAL  Final   Special Requests   Final     BOTTLES DRAWN AEROBIC AND ANAEROBIC Blood Culture adequate volume   Culture   Final    NO GROWTH < 24 HOURS Performed at [facility], [address], [place], [place] [address]    Report Status PENDING  Incomplete  Blood culture (routine x 2)     Status: None (Preliminary result)   Collection Time: 04/10/23  3:22 PM   Specimen: BLOOD  Result Value Ref Range Status   Specimen Description BLOOD RIGHT ANTECUBITAL  Final   Special Requests   Final    BOTTLES DRAWN AEROBIC AND ANAEROBIC Blood Culture adequate volume   Culture   Final    NO GROWTH < 24 HOURS Performed at [facility], [address] [place],  [place] [address]    Report Status PENDING  Incomplete  Culture, blood (x 2)     Status: None (Preliminary result)   Collection Time: 04/10/23 10:51 PM   Specimen: BLOOD LEFT ARM  Result Value Ref Range Status   Specimen Description BLOOD LEFT ARM  Final   Special Requests   Final    BOTTLES DRAWN AEROBIC AND ANAEROBIC Blood Culture adequate volume   Culture   Final    NO GROWTH < 12 HOURS Performed at [facility], [address] [place], [place] [address]    Report Status PENDING  Incomplete     Medications:    buPROPion  300 mg Oral QPC breakfast   busPIRone  20 mg Oral BID   cholecalciferol  10,000 Units Oral q morning   escitalopram  20 mg Oral QHS   guaiFENesin  600 mg Oral BID   ipratropium-albuterol  3 mL Nebulization QID   lactose free nutrition  237 mL Oral BID BM   linaclotide  72 mcg Oral QODAY   mirtazapine  15 mg Oral QHS   pantoprazole (PROTONIX) IV  40 mg Intravenous Q12H   QUEtiapine  25 mg Oral BID   sucralfate  1 g Oral TID WC & HS   Continuous Infusions:  sodium chloride Stopped (04/11/23 0110)   azithromycin     cefTRIAXone (ROCEPHIN)  IV     lactated ringers Stopped (04/11/23 0503)      LOS: 1 day   Marinda Elk  [facility]  04/11/2023, 10:32 AM

## 2023-04-11 NOTE — Assessment & Plan Note (Signed)
-   We have also troponins. - She has no chest pain. - This could be likely related to demand ischemia.

## 2023-04-11 NOTE — Assessment & Plan Note (Addendum)
-   We will continue BuSpar, Wellbutrin XL and Lexapro as well as Seroquel and Remeron.Marland Kitchen

## 2023-04-11 NOTE — ED Notes (Signed)
 ED TO INPATIENT HANDOFF REPORT  ED Nurse Name and Phone #: Haze Justin [phone]  S Name/Age/Gender Sheryl Bender 78 y.o. female Room/Bed: APA14/APA14  Code Status   Code Status: Limited: Do not attempt resuscitation (DNR) -DNR-LIMITED -Do Not Intubate/DNI   Home/SNF/Other Boarding Home Patient oriented to: self, place, time, and situation Is this baseline? Yes   Triage Complete: Triage complete  Chief Complaint Obstructive jaundice [K83.1]  Triage Note Pt bib pov w/ c/o sob x 4 days. Pt is yellowish pale in triage. Pt reports dizziness but no changes in vision. Pt currently has stage 1 pancreatic cancer that is inoperable. Pt also has chronic issues with stable hemoglobin and iron levels. Pt is tachyphemic in triage with an o2 sat of 84 on RA.    Allergies Allergies  Allergen Reactions   Other Hives and Other (See Comments)    Cherry wood just cut- "smelled it and broke out"; cannot tolerate ANY cherry fragrances, either      Cherry Hives   Wound Dressing Adhesive Rash and Other (See Comments)    Band-Aids = local reaction    Level of Care/Admitting Diagnosis ED Disposition     ED Disposition  Admit   Condition  --   Comment  Hospital Area: [facility] [[id number]]  Level of Care: Telemetry Medical [104]  May admit patient to [facility] or [facility] if equivalent level of care is available:: No  Covid Evaluation: Asymptomatic - no recent exposure (last 10 days) testing not required  Diagnosis: Obstructive jaundice [709956]  Admitting Physician: Hannah Beat [[id number]]  Attending Physician: Hannah Beat [[id number]]  Certification:: I certify this patient will need inpatient services for at least 2 midnights  Expected Medical Readiness: 04/13/2023          B Medical/Surgery History Past Medical History:  Diagnosis Date   Anxiety    Arthritis    Depression    Dysrhythmia    hx palpitations greater than 5 yrs -neg echo, stress per  pt ? where or dr   GERD (gastroesophageal reflux disease)    occ   Nausea & vomiting 08/24/2021   Pancreatic adenocarcinoma (HCC)    Pancreatic pseudocyst    Port-A-Cath in place 09/15/2021   Pulmonary embolism [facility])    September 2022   Past Surgical History:  Procedure Laterality Date   ANTERIOR LAT LUMBAR FUSION Right 12/16/2015   Procedure: RIGHT LUMBAR TWO-THREE, LUMBAR THREE-FOUR, LUMBAR FOUR-FIVE ANTEROLATERAL LUMBAR INTERBODY FUSION;  Surgeon: Maeola Harman, MD;  [facility] OR;  Service: Neurosurgery;  Laterality: Right;   BALLOON DILATION N/A 08/27/2021   Procedure: BALLOON DILATION;  Surgeon: Meridee Score Netty Starring., MD;  Location: [facility];  Service: Gastroenterology;  Laterality: N/A;   BIOPSY  07/09/2021   Procedure: BIOPSY;  Surgeon: Meridee Score Netty Starring., MD;  Location: [facility] ENDOSCOPY;  Service: Gastroenterology;;   COLONOSCOPY WITH PROPOFOL N/A 12/29/2022   Procedure: COLONOSCOPY WITH PROPOFOL;  Surgeon: Corbin Ade, MD;  [facility];  Service: Endoscopy;  Laterality: N/A;   CYST GASTROSTOMY  08/27/2021   Procedure: CYST GASTROSTOMY;  Surgeon: Meridee Score Netty Starring., MD;  Location: [facility] ENDOSCOPY;  Service: Gastroenterology;;   ESOPHAGOGASTRODUODENOSCOPY N/A 07/09/2021   Procedure: ESOPHAGOGASTRODUODENOSCOPY (EGD);  Surgeon: Lemar Lofty., MD;  Location: [facility] ENDOSCOPY;  Service: Gastroenterology;  Laterality: N/A;   ESOPHAGOGASTRODUODENOSCOPY N/A 02/07/2023   Procedure: ESOPHAGOGASTRODUODENOSCOPY (EGD);  Surgeon: Imogene Burn, MD;  Location: [facility] ENDOSCOPY;  Service: Gastroenterology;  Laterality: N/A;   ESOPHAGOGASTRODUODENOSCOPY (  EGD) WITH PROPOFOL N/A 08/27/2021   Procedure: ESOPHAGOGASTRODUODENOSCOPY (EGD) WITH PROPOFOL;  Surgeon: Meridee Score Netty Starring., MD;  Location: [facility] ENDOSCOPY;  Service: Gastroenterology;  Laterality: N/A;   ESOPHAGOGASTRODUODENOSCOPY (EGD) WITH PROPOFOL N/A 10/08/2021   Procedure: ESOPHAGOGASTRODUODENOSCOPY (EGD) WITH PROPOFOL;   Surgeon: Meridee Score Netty Starring., MD;  Location: [facility] ENDOSCOPY;  Service: Gastroenterology;  Laterality: N/A;   ESOPHAGOGASTRODUODENOSCOPY (EGD) WITH PROPOFOL N/A 12/29/2022   Procedure: ESOPHAGOGASTRODUODENOSCOPY (EGD) WITH PROPOFOL;  Surgeon: Corbin Ade, MD;  [facility];  Service: Endoscopy;  Laterality: N/A;   ESOPHAGOGASTRODUODENOSCOPY (EGD) WITH PROPOFOL N/A 01/13/2023   Procedure: ESOPHAGOGASTRODUODENOSCOPY (EGD) WITH PROPOFOL;  Surgeon: Franky Macho, MD;  [facility];  Service: Endoscopy;  Laterality: N/A;   ESOPHAGOGASTRODUODENOSCOPY (EGD) WITH PROPOFOL N/A 01/30/2023   Procedure: ESOPHAGOGASTRODUODENOSCOPY (EGD) WITH PROPOFOL;  Surgeon: Lanelle Bal, DO;  [facility];  Service: Endoscopy;  Laterality: N/A;   ESOPHAGOGASTRODUODENOSCOPY (EGD) WITH PROPOFOL N/A 02/18/2023   Procedure: ESOPHAGOGASTRODUODENOSCOPY (EGD) WITH PROPOFOL;  Surgeon: Lynann Bologna, MD;  Location: [facility] ENDOSCOPY;  Service: Gastroenterology;  Laterality: N/A;   EUS N/A 07/09/2021   Procedure: UPPER ENDOSCOPIC ULTRASOUND (EUS) RADIAL;  Surgeon: Lemar Lofty., MD;  Location: [facility] ENDOSCOPY;  Service: Gastroenterology;  Laterality: N/A;   EUS N/A 08/27/2021   Procedure: UPPER ENDOSCOPIC ULTRASOUND (EUS) LINEAR;  Surgeon: Lemar Lofty., MD;  Location: [facility]OSCOPY;  Service: Gastroenterology;  Laterality: N/A;   FINE NEEDLE ASPIRATION  07/09/2021   Procedure: FINE NEEDLE ASPIRATION (FNA) LINEAR;  Surgeon: Lemar Lofty., MD;  Location: [facility] ENDOSCOPY;  Service: Gastroenterology;;   HEMOSTASIS CLIP PLACEMENT  12/29/2022   Procedure: HEMOSTASIS CLIP PLACEMENT;  Surgeon: Corbin Ade, MD;  [facility];  Service: Endoscopy;;   HEMOSTASIS CLIP PLACEMENT  02/18/2023   Procedure: HEMOSTASIS CLIP PLACEMENT;  Surgeon: Lynann Bologna, MD;  Location: [facility] ENDOSCOPY;  Service: Gastroenterology;;   HEMOSTASIS CONTROL  02/18/2023   Procedure: HEMOSTASIS  CONTROL;  Surgeon: Lynann Bologna, MD;  Location: [facility] ENDOSCOPY;  Service: Gastroenterology;;   HOT HEMOSTASIS  12/29/2022   Procedure: HOT HEMOSTASIS (ARGON PLASMA COAGULATION/BICAP);  Surgeon: Corbin Ade, MD;  [facility];  Service: Endoscopy;;   HOT HEMOSTASIS  01/13/2023   Procedure: HOT HEMOSTASIS (ARGON PLASMA COAGULATION/BICAP);  Surgeon: Franky Macho, MD;  [facility];  Service: Endoscopy;;  used apc and goldprobe   HOT HEMOSTASIS  01/30/2023   Procedure: HOT HEMOSTASIS (ARGON PLASMA COAGULATION/BICAP);  Surgeon: Lanelle Bal, DO;  [facility];  Service: Endoscopy;;   HOT HEMOSTASIS N/A 02/07/2023   Procedure: HOT HEMOSTASIS (ARGON PLASMA COAGULATION/BICAP);  Surgeon: Imogene Burn, MD;  Location: [facility] ENDOSCOPY;  Service: Gastroenterology;  Laterality: N/A;   HOT HEMOSTASIS N/A 02/18/2023   Procedure: HOT HEMOSTASIS (ARGON PLASMA COAGULATION/BICAP);  Surgeon: Lynann Bologna, MD;  Location: [facility] ENDOSCOPY;  Service: Gastroenterology;  Laterality: N/A;   IR EMBO VENOUS NOT HEMORR HEMANG  INC GUIDE ROADMAPPING  02/09/2023   IR IMAGING GUIDED PORT INSERTION  09/14/2021   IR RADIOLOGIST EVAL & MGMT  03/04/2023   IR THROMBECT SEC MECH MOD SED  02/23/2023   IR THROMBECT VENO MECH MOD SED  02/09/2023   IR TIPS  02/09/2023   IR TIPS REVISION MOD SED  02/23/2023   IR US GUIDE VASC ACCESS LEFT  02/23/2023   IR US GUIDE VASC ACCESS RIGHT  02/09/2023   IR US GUIDE VASC ACCESS RIGHT  02/09/2023   LAPAROSCOPY N/A 02/15/2022  Procedure: STAGING DIAGNOSTIC;  Surgeon: Fritzi Mandes, MD;  Location: [facility] OR;  Service: General;  Laterality: N/A;   LUMBAR PERCUTANEOUS PEDICLE SCREW 3 LEVEL Bilateral 12/16/2015   Procedure: PERCUTANEOUS PEDICLE SCREWS BILATERALLY AT LUMBAR TWO-FIVE;  Surgeon: Maeola Harman, MD;  Location: [facility] OR;  Service: Neurosurgery;  Laterality: Bilateral;   PANCREATIC STENT PLACEMENT  08/27/2021   Procedure: PANCREATIC STENT PLACEMENT;  Surgeon: Lemar Lofty., MD;  Location: [facility] ENDOSCOPY;  Service: Gastroenterology;;   POLYPECTOMY  12/29/2022   Procedure: POLYPECTOMY INTESTINAL;  Surgeon: Corbin Ade, MD;  [facility];  Service: Endoscopy;;   STENT REMOVAL  10/08/2021   Procedure: Francine Graven REMOVAL;  Surgeon: Lemar Lofty., MD;  Location: [facility] ENDOSCOPY;  Service: Gastroenterology;;   TIPS PROCEDURE N/A 02/09/2023   Procedure: TRANS-JUGULAR INTRAHEPATIC PORTAL SHUNT (TIPS);  Surgeon: Bennie Dallas, MD;  Location: [facility] OR;  Service: Radiology;  Laterality: N/A;   TUBAL LIGATION  1974   WHIPPLE PROCEDURE N/A 02/15/2022   Procedure: ATTEMPTED WHIPPLE PROCEDURE;  Surgeon: Fritzi Mandes, MD;  Location: [facility] OR;  Service: General;  Laterality: N/A;     A IV Location/Drains/Wounds Patient Lines/Drains/Airways Status     Active Line/Drains/Airways     Name Placement date Placement time Site Days   Implanted Port 05/04/22 Right Chest 05/04/22  1320  Chest  342            Intake/Output Last 24 hours  Intake/Output Summary (Last 24 hours) at 04/11/2023 0724 Last data filed at 04/10/2023 1750 Gross per 24 hour  Intake 100 ml  Output --  Net 100 ml    Labs/Imaging Results for orders placed or performed during the hospital encounter of 04/10/23 (from the past 48 hours)  Resp panel by RT-PCR (RSV, Flu A&B, Covid) Anterior Nasal Swab     Status: None   Collection Time: 04/10/23  2:20 PM   Specimen: Anterior Nasal Swab  Result Value Ref Range   SARS Coronavirus 2 by RT PCR NEGATIVE NEGATIVE    Comment: (NOTE) SARS-CoV-2 target nucleic acids are NOT DETECTED.  The SARS-CoV-2 RNA is generally detectable in upper respiratory specimens during the acute phase of infection. The lowest concentration of SARS-CoV-2 viral copies this assay can detect is 138 copies/mL. A negative result does not preclude SARS-Cov-2 infection and should not be used as the sole basis for treatment or other patient management decisions. A  negative result may occur with  improper specimen collection/handling, submission of specimen other than nasopharyngeal swab, presence of viral mutation(s) within the areas targeted by this assay, and inadequate number of viral copies(<138 copies/mL). A negative result must be combined with clinical observations, patient history, and epidemiological information. The expected result is Negative.  Fact Sheet for Patients:  BloggerCourse.com  Fact Sheet for Healthcare Providers:  SeriousBroker.it  This test is no t yet approved or cleared by the [place] FDA and  has been authorized for detection and/or diagnosis of SARS-CoV-2 by FDA under an Emergency Use Authorization (EUA). This EUA will remain  in effect (meaning this test can be used) for the duration of the COVID-19 declaration under Section 564(b)(1) of the Act, 21 U.S.C.section 360bbb-3(b)(1), unless the authorization is terminated  or revoked sooner.       Influenza A by PCR NEGATIVE NEGATIVE   Influenza B by PCR NEGATIVE NEGATIVE    Comment: (NOTE) The Xpert Xpress SARS-CoV-2/FLU/RSV plus assay is intended as an aid in the diagnosis of influenza from Nasopharyngeal swab  specimens and should not be used as a sole basis for treatment. Nasal washings and aspirates are unacceptable for Xpert Xpress SARS-CoV-2/FLU/RSV testing.  Fact Sheet for Patients: BloggerCourse.com  Fact Sheet for Healthcare Providers: SeriousBroker.it  This test is not yet approved or cleared by the [place] FDA and has been authorized for detection and/or diagnosis of SARS-CoV-2 by FDA under an Emergency Use Authorization (EUA). This EUA will remain in effect (meaning this test can be used) for the duration of the COVID-19 declaration under Section 564(b)(1) of the Act, 21 U.S.C. section 360bbb-3(b)(1), unless the authorization is terminated  or revoked.     Resp Syncytial Virus by PCR NEGATIVE NEGATIVE    Comment: (NOTE) Fact Sheet for Patients: BloggerCourse.com  Fact Sheet for Healthcare Providers: SeriousBroker.it  This test is not yet approved or cleared by the [place] FDA and has been authorized for detection and/or diagnosis of SARS-CoV-2 by FDA under an Emergency Use Authorization (EUA). This EUA will remain in effect (meaning this test can be used) for the duration of the COVID-19 declaration under Section 564(b)(1) of the Act, 21 U.S.C. section 360bbb-3(b)(1), unless the authorization is terminated or revoked.  Performed at [facility], [address], [place], [place] [address]   CBC     Status: Abnormal   Collection Time: 04/10/23  3:22 PM  Result Value Ref Range   WBC 6.4 4.0 - 10.5 K/uL   RBC 2.52 (L) 3.87 - 5.11 MIL/uL   Hemoglobin 8.1 (L) 12.0 - 15.0 g/dL   HCT 60.4 (L) 54.0 - 98.1 %   MCV 101.6 (H) 80.0 - 100.0 fL   MCH 32.1 26.0 - 34.0 pg   MCHC 31.6 30.0 - 36.0 g/dL   RDW 19.1 (H) 47.8 - 29.5 %   Platelets 97 (L) 150 - 400 K/uL    Comment: SPECIMEN CHECKED FOR CLOTS PLATELET COUNT CONFIRMED BY SMEAR    nRBC 0.0 0.0 - 0.2 %    Comment: Performed at [facility], [address] [place], [place] [address]  Comprehensive metabolic panel     Status: Abnormal   Collection Time: 04/10/23  3:22 PM  Result Value Ref Range   Sodium 135 135 - 145 mmol/L   Potassium 3.4 (L) 3.5 - 5.1 mmol/L   Chloride 107 98 - 111 mmol/L   CO2 19 (L) 22 - 32 mmol/L   Glucose, Bld 158 (H) 70 - 99 mg/dL    Comment: Glucose reference range applies only to samples taken after fasting for at least 8 hours.   BUN 16 8 - 23 mg/dL   Creatinine, Ser 8.65 0.44 - 1.00 mg/dL   Calcium 8.5 (L) 8.9 - 10.3 mg/dL   Total Protein 5.4 (L) 6.5 - 8.1 g/dL   Albumin 2.9 (L) 3.5 - 5.0 g/dL   AST 784 (H) 15 - 41 U/L   ALT 542 (H) 0 - 44 U/L   Alkaline Phosphatase 605 (H) 38 -  126 U/L   Total Bilirubin 4.3 (H) 0.0 - 1.2 mg/dL   GFR, Estimated >69 >62 mL/min    Comment: (NOTE) Calculated using the CKD-EPI Creatinine Equation (2021)    Anion gap 9 5 - 15    Comment: Performed at [facility], [address] [place], [place] [address]  Lipase, blood     Status: None   Collection Time: 04/10/23  3:22 PM  Result Value Ref Range   Lipase 15 11 - 51 U/L    Comment: Performed at [employer]  [facility], [address] [place], [place] [address]  Troponin I (High Sensitivity)     Status: Abnormal   Collection Time: 04/10/23  3:22 PM  Result Value Ref Range   Troponin I (High Sensitivity) 142 (HH) <18 ng/L    Comment: CRITICAL RESULT CALLED TO, READ BACK BY AND VERIFIED WITH M EANES AT 1623 ON [id number] BY S DALTON (NOTE) Elevated high sensitivity troponin I (hsTnI) values and significant  changes across serial measurements may suggest ACS but many other  chronic and acute conditions are known to elevate hsTnI results.  Refer to the "Links" section for chest pain algorithms and additional  guidance. Performed at [facility], [address] [place], [place] [address]   Lactic acid, plasma     Status: None   Collection Time: 04/10/23  3:22 PM  Result Value Ref Range   Lactic Acid, Venous 1.9 0.5 - 1.9 mmol/L    Comment: Performed at [facility], [address], [place], [place] [address]  Blood culture (routine x 2)     Status: None (Preliminary result)   Collection Time: 04/10/23  3:22 PM   Specimen: BLOOD  Result Value Ref Range   Specimen Description BLOOD LEFT ANTECUBITAL    Special Requests      BOTTLES DRAWN AEROBIC AND ANAEROBIC Blood Culture adequate volume Performed at [facility], [address] [place], [place] [address]    Culture PENDING    Report Status PENDING   Blood culture (routine x 2)     Status: None (Preliminary result)   Collection Time: 04/10/23  3:22 PM   Specimen: BLOOD  Result Value Ref Range   Specimen Description BLOOD RIGHT ANTECUBITAL     Special Requests      BOTTLES DRAWN AEROBIC AND ANAEROBIC Blood Culture adequate volume Performed at [facility], [address] [place], [place] [address]    Culture PENDING    Report Status PENDING   Type and screen     Status: None (Preliminary result)   Collection Time: 04/10/23  3:22 PM  Result Value Ref Range   ABO/RH(D) A POS    Antibody Screen NEG    Sample Expiration      04/13/2023,2359 Performed at [facility], [address] [place], [place] [address]    Unit Number [id number]    Blood Component Type RBC LR PHER1    Unit division 00    Status of Unit ALLOCATED    Transfusion Status OK TO TRANSFUSE    Crossmatch Result Compatible   POC occult blood, ED     Status: Abnormal   Collection Time: 04/10/23  4:30 PM  Result Value Ref Range   Fecal Occult Bld POSITIVE (A) NEGATIVE  Prepare RBC (crossmatch)     Status: None   Collection Time: 04/10/23  4:36 PM  Result Value Ref Range   Order Confirmation      ORDER PROCESSED BY BLOOD BANK Performed at [facility], [address] [place], [place] [address]   Lactic acid, plasma     Status: None   Collection Time: 04/10/23  5:06 PM  Result Value Ref Range   Lactic Acid, Venous 1.5 0.5 - 1.9 mmol/L    Comment: Performed at [facility], [address], [place], [place] [address]  Protime-INR     Status: None   Collection Time: 04/10/23  5:06 PM  Result Value Ref Range   Prothrombin Time 14.7 11.4 - 15.2 seconds   INR 1.1 0.8 - 1.2  Comment: (NOTE) INR goal varies based on device and disease states. Performed at [facility], [address], [place], [place] [address]   APTT     Status: None   Collection Time: 04/10/23  5:06 PM  Result Value Ref Range   aPTT 28 24 - 36 seconds    Comment: Performed at [facility], [address], [place], [place] [address]  Culture, blood (x 2)     Status: None (Preliminary result)   Collection Time: 04/10/23 10:51 PM   Specimen: BLOOD LEFT ARM  Result Value Ref Range    Specimen Description BLOOD LEFT ARM    Special Requests      BOTTLES DRAWN AEROBIC AND ANAEROBIC Blood Culture adequate volume Performed at [facility], [address] [place], [place] [address]    Culture PENDING    Report Status PENDING   Lactic acid, plasma     Status: None   Collection Time: 04/10/23 10:51 PM  Result Value Ref Range   Lactic Acid, Venous 1.1 0.5 - 1.9 mmol/L    Comment: Performed at [facility], [address], [place], [place] [address]  Protime-INR     Status: None   Collection Time: 04/10/23 10:51 PM  Result Value Ref Range   Prothrombin Time 15.0 11.4 - 15.2 seconds   INR 1.2 0.8 - 1.2    Comment: (NOTE) INR goal varies based on device and disease states. Performed at [facility], [address], [place], [place] [address]   APTT     Status: None   Collection Time: 04/10/23 10:51 PM  Result Value Ref Range   aPTT 35 24 - 36 seconds    Comment: Performed at [facility], [address], [place], [place] [address]  Troponin I (High Sensitivity)     Status: Abnormal   Collection Time: 04/11/23  3:49 AM  Result Value Ref Range   Troponin I (High Sensitivity) 73 (H) <18 ng/L    Comment: DELTA CHECK NOTED (NOTE) Elevated high sensitivity troponin I (hsTnI) values and significant  changes across serial measurements may suggest ACS but many other  chronic and acute conditions are known to elevate hsTnI results.  Refer to the "Links" section for chest pain algorithms and additional  guidance. Performed at [facility], [address] [place], [place] [address]   Basic metabolic panel     Status: Abnormal   Collection Time: 04/11/23  4:56 AM  Result Value Ref Range   Sodium 137 135 - 145 mmol/L   Potassium 3.5 3.5 - 5.1 mmol/L   Chloride 109 98 - 111 mmol/L   CO2 23 22 - 32 mmol/L   Glucose, Bld 108 (H) 70 - 99 mg/dL    Comment: Glucose reference range applies only to samples taken after fasting for at least 8 hours.   BUN 13 8 - 23 mg/dL   Creatinine,  Ser 6.01 0.44 - 1.00 mg/dL   Calcium 8.2 (L) 8.9 - 10.3 mg/dL   GFR, Estimated >09 >32 mL/min    Comment: (NOTE) Calculated using the CKD-EPI Creatinine Equation (2021)    Anion gap 5 5 - 15    Comment: Performed at [facility], [address], [place], [place] [address]  CBC     Status: Abnormal   Collection Time: 04/11/23  4:56 AM  Result Value Ref Range   WBC 4.6 4.0 - 10.5 K/uL   RBC 2.16 (L) 3.87 - 5.11 MIL/uL   Hemoglobin 7.0 (L) 12.0 - 15.0 g/dL  HCT 22.2 (L) 36.0 - 46.0 %   MCV 102.8 (H) 80.0 - 100.0 fL   MCH 32.4 26.0 - 34.0 pg   MCHC 31.5 30.0 - 36.0 g/dL   RDW 40.9 (H) 81.1 - 91.4 %   Platelets 75 (L) 150 - 400 K/uL    Comment: SPECIMEN CHECKED FOR CLOTS Immature Platelet Fraction may be clinically indicated, consider ordering this additional test [id number] PLATELET COUNT CONFIRMED BY SMEAR    nRBC 0.0 0.0 - 0.2 %    Comment: Performed at [facility], [address] [place], [place] [address]  Troponin I (High Sensitivity)     Status: Abnormal   Collection Time: 04/11/23  4:56 AM  Result Value Ref Range   Troponin I (High Sensitivity) 78 (H) <18 ng/L    Comment: DELTA CHECK NOTED (NOTE) Elevated high sensitivity troponin I (hsTnI) values and significant  changes across serial measurements may suggest ACS but many other  chronic and acute conditions are known to elevate hsTnI results.  Refer to the "Links" section for chest pain algorithms and additional  guidance. Performed at [facility], [address] [place], [place] [address]    CT ABDOMEN PELVIS W CONTRAST Result Date: 04/10/2023 CLINICAL DATA:  Pancreatic cancer. Portal vein thrombosis. Thrombosed tips shunt identified on comparison CT. EXAM: CT ABDOMEN AND PELVIS WITH CONTRAST TECHNIQUE: Multidetector CT imaging of the abdomen and pelvis was performed using the standard protocol following bolus administration of intravenous contrast. RADIATION DOSE REDUCTION: This exam was performed according to the  departmental dose-optimization program which includes automated exposure control, adjustment of the mA and/or kV according to patient size and/or use of iterative reconstruction technique. CONTRAST:  OMNIPAQUE IOHEXOL 300 MG/ML  SOLN COMPARISON:  CT a 02/25/2023 FINDINGS: Lower chest: Bilateral pleural effusions with associated bibasilar atelectasis. No pulmonary edema. Pneumonia. Hepatobiliary: Again demonstrated the tip shunt is thrombosed. Thrombus extends into the IVC above the tip shunt below the atrium (image 5/series 5.) the portal veins are thrombosed. There is a moderate volume of free fluid along the margin of the liver similar comparison exam. Small amount of fluid along the LEFT and RIGHT pericolic gutter which collects the pelvis volume ascites similar to prior CT normal volume spleen Pancreas: Pancreas demonstrates chronic dilatation of the duct and atrophy. There is a mass the pancreas unchanged measuring 18 mm image 29/5. Spleen: Normal volume spleen Adrenals/urinary tract: Adrenal glands and kidneys are normal. The ureters and bladder normal. Stomach/Bowel: Stomach, small bowel, appendix, and cecum are normal. The colon and rectosigmoid colon are normal. Vascular/Lymphatic: Aorta is normal. Thrombosis of the portal vein as above. Reproductive: Uterus unremarkable Other: Moderate volume of free fluid/ascites similar to comparison CT Musculoskeletal: No aggressive osseous lesion. IMPRESSION: 1. No significant change from CT 02/25/2023. 2. 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 3. Moderate to large volume ascites not changed in volume. 4. Pancreatic head mass unchanged. 5. No evidence of bowel obstruction. 6. Bilateral pleural effusions bibasilar atelectasis. No evidence of pneumonia. Electronically Signed   By: Genevive Bi M.D.   On: 04/10/2023 19:11   DG Chest Portable 1 View Result Date: 04/10/2023 CLINICAL DATA:  Short of breath EXAM: PORTABLE CHEST 1  VIEW COMPARISON:  None Available. FINDINGS: Port in the anterior chest wall with tip in distal SVC. Density of the RIGHT cardiophrenic angle. LEFT basilar atelectasis. Upper lungs are clear. No pneumothorax. IMPRESSION: Medial RIGHT lower alobe density representing pneumonia, atelectasis or aspiration pneumonitis. Electronically Signed  By: Genevive Bi M.D.   On: 04/10/2023 15:38    Pending Labs Unresulted Labs (From admission, onward)    None       Vitals/Pain Today's Vitals   04/11/23 0244 04/11/23 0445 04/11/23 0500 04/11/23 0545  BP:  (!) 99/55 118/65 (!) 101/56  Pulse:  94 89 92  Resp:  12 20 (!) 23  Temp:      TempSrc:      SpO2:  93% 95% 96%  Weight:      Height:      PainSc: 0-No pain       Isolation Precautions No active isolations  Medications Medications  buPROPion (WELLBUTRIN XL) 24 hr tablet 300 mg (has no administration in time range)  busPIRone (BUSPAR) tablet 20 mg (20 mg Oral Given 04/10/23 2316)  escitalopram (LEXAPRO) tablet 20 mg (20 mg Oral Given 04/10/23 2151)  mirtazapine (REMERON) tablet 15 mg (15 mg Oral Given 04/10/23 2151)  QUEtiapine (SEROQUEL) tablet 25 mg (25 mg Oral Given 04/10/23 2316)  linaclotide (LINZESS) capsule 72 mcg (has no administration in time range)  sucralfate (CARAFATE) tablet 1 g (1 g Oral Given 04/10/23 2151)  cholecalciferol (VITAMIN D3) 25 MCG (1000 UNIT) tablet 10,000 Units (has no administration in time range)  lactose free nutrition (Boost) liquid 237 mL (has no administration in time range)  0.9 %  sodium chloride infusion (0 mLs Intravenous Stopped 04/11/23 0110)  acetaminophen (TYLENOL) tablet 650 mg (has no administration in time range)    Or  acetaminophen (TYLENOL) suppository 650 mg (has no administration in time range)  traZODone (DESYREL) tablet 25 mg (has no administration in time range)  magnesium hydroxide (MILK OF MAGNESIA) suspension 30 mL (has no administration in time range)  ondansetron (ZOFRAN) tablet 4  mg (has no administration in time range)    Or  ondansetron (ZOFRAN) injection 4 mg (has no administration in time range)  lactated ringers infusion (0 mL/hr Intravenous Stopped 04/11/23 0503)  cefTRIAXone (ROCEPHIN) 2 g in sodium chloride 0.9 % 100 mL IVPB (has no administration in time range)  azithromycin (ZITHROMAX) 500 mg in sodium chloride 0.9 % 250 mL IVPB (has no administration in time range)  pantoprazole (PROTONIX) injection 40 mg (40 mg Intravenous Given 04/11/23 0224)  morphine (PF) 2 MG/ML injection 2 mg (has no administration in time range)  guaiFENesin (MUCINEX) 12 hr tablet 600 mg (600 mg Oral Given 04/11/23 0223)  chlorpheniramine-HYDROcodone (TUSSIONEX) 10-8 MG/5ML suspension 5 mL (has no administration in time range)  ipratropium-albuterol (DUONEB) 0.5-2.5 (3) MG/3ML nebulizer solution 3 mL (has no administration in time range)  lidocaine-EPINEPHrine-tetracaine (LET) topical gel (3 mLs Topical Given 04/10/23 1500)  cefTRIAXone (ROCEPHIN) 1 g in sodium chloride 0.9 % 100 mL IVPB (0 g Intravenous Stopped 04/10/23 1750)  azithromycin (ZITHROMAX) 500 mg in sodium chloride 0.9 % 250 mL IVPB (0 mg Intravenous Stopped 04/10/23 1904)  0.9 %  sodium chloride infusion (Manually program via Guardrails IV Fluids) ( Intravenous New Bag/Given 04/10/23 1713)  iohexol (OMNIPAQUE) 300 MG/ML solution 100 mL (100 mLs Intravenous Contrast Given 04/10/23 1825)  sterile water (preservative free) injection (10 mLs  Given 04/11/23 0224)    Mobility walks with person assist       R Recommendations: See Admitting Provider Note  Report given to:

## 2023-04-11 NOTE — Progress Notes (Signed)
   04/11/23 0922  Vitals  Temp 98.4 F (36.9 C)  Temp Source Oral  BP (!) 100/59  MAP (mmHg) 70  BP Location Left Arm  BP Method Automatic  Patient Position (if appropriate) Lying  Pulse Rate 95  Pulse Rate Source Monitor  Resp 16  Level of Consciousness  Level of Consciousness Alert  MEWS COLOR  MEWS Score Color Green  Oxygen Therapy  SpO2 95 %  O2 Device Nasal Cannula  Pain Assessment  Pain Scale 0-10  Pain Score 0  MEWS Score  MEWS Temp 0  MEWS Systolic 1  MEWS Pulse 0  MEWS RR 0  MEWS LOC 0  MEWS Score 1

## 2023-04-11 NOTE — Progress Notes (Signed)
 Pt arrived to 6 north room 2 via PTAR alert and orieted x4. Pain level 0/10. Skin: chest slight jaundice, eyes jaundice in outer corners, bruising noted on right knee. Belongings kept at bedside: hearing aids, glasses, clothing, purse. NS running @10  through accessed right chest port. Bed in lowest position, mats placed on floor beside bed. Call light in reach. Bed alarm on. All needs met at this time.

## 2023-04-11 NOTE — Plan of Care (Signed)
  Problem: Respiratory: Goal: Ability to maintain adequate ventilation will improve Outcome: Not Progressing   Problem: Clinical Measurements: Goal: Respiratory complications will improve Outcome: Not Progressing   Problem: Activity: Goal: Risk for activity intolerance will decrease Outcome: Progressing   Problem: Nutrition: Goal: Adequate nutrition will be maintained Outcome: Progressing   Problem: Safety: Goal: Ability to remain free from injury will improve Outcome: Progressing

## 2023-04-11 NOTE — Consult Note (Addendum)
 Consultation  Referring Provider:   Dr. David Stall   Primary Care Physician:  [name], DO Primary Gastroenterologist: [facility]        Reason for Consultation:  Abdominal pain, Anemia, Pancreatic Cancer, Elevated LFT's         HPI:   Sheryl Bender is a 78 y.o. female with a past medical history of anxiety, depression, GERD, nausea and vomiting, pulmonary embolism not on anticoagulation, pancreatic adenocarcinoma status post chemo radiation and multiple others, who presented to the hospital for productive cough and SOB.  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    At time of admission it was noted the patient was admitted last month for GI bleeding requiring PRBC transfusion.  This visit she presented with acute onset of generalized weakness and malaise with associated upper abdominal pain graded as a 5/10.  Associate symptoms include nausea without vomiting.  Also a cough productive of clear sputum over the past 9 to 10 days.    Today, patient explains that she came into the hospital due to acute SOB.  As far as GI symptoms, her degree of abdominal pain is the same as it always has been a 5/10.  No new nausea or vomiting.      She denies any overt GI bleeding and is aware of her GAVE and recommendations for a more conservative approach in regards to this.    Patient is aware that her LFT's are increased compared to prior and asks some questions about this.    Does note that she is chronically constipated and thinks she needs a dose of her Linzess.    Denies fever, chills or change in bowel habits.  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, moderate to large volume ascites, pancreatic head mass unchanged, bilateral pleural effusions and bibasilar atelectasis-started on IV Rocephin and Zithromax  GI history: 03/12/2023 consulted by gastroenterology and hepatology at [facility] that time noted pancreatic cancer, portal and SMV thrombosis and 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, noted that patient was well-known to their service given multiple admissions for GI bleed from 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 that time last transferred to [facility] January 2025 when she underwent a TIPS on 02/09/2023 with portal thrombectomy of the main portal vein and SMV balloon angioplasty and embolization of left gastric vein, reimaging in January had shown reocclusion of TIPS-she was admitted that time and eventually transferred to hospice after all options were exhausted-in the note described the patient had 5 upper endoscopy since December 2025 with no change in the trajectory of her disease course, recommended conservative management of the patient with transfusion and PPI 02/18/2023-02/25/2023: Patient followed by our service in the hospital please see all those notes for further detail but last progress note 02/25/2023 from Dr. Clydie Braun discussed that unfortunately  repeat CT angiogram head showed rethrombosis of the portal/mesenteric vascular thrombosis that was just treated earlier that week, she remained on unfractionated heparin and had a maroon bowel movement, at that time aware that nothing else could be done for her and she was requesting transfer to hospice facility-at that time recommended  heparin be stopped since it was clearly not preventing rethrombosis of the TIPS and is only worsening GI bleeding from a source for which there is no durable endoscopic solution 02/18/2023 EGD for history of GAVE and recent TIPS with findings of multiple gastric ulcers and 2 adherent clots treated endoscopically, GAVE, erythematous duodenopathy and moderate hiatal hernia; plan at that time Carafate elixir 1 g p.o. 4 times daily x 2 weeks, IR consultation for occluded TIPS, discussed limited options with IR  Past Medical History:  Diagnosis Date  . Anxiety   . Arthritis   . Depression   . Dysrhythmia    hx palpitations greater than 5 yrs -neg echo, stress per pt ? where or dr  . GERD (gastroesophageal reflux disease)    occ  . Nausea & vomiting 08/24/2021  . Pancreatic adenocarcinoma (HCC)   . Pancreatic pseudocyst   . Port-A-Cath in place 09/15/2021  . Pulmonary embolism [facility])    September 2022    Past Surgical History:  Procedure Laterality Date  . ANTERIOR LAT LUMBAR FUSION Right 12/16/2015   Procedure: RIGHT LUMBAR TWO-THREE, LUMBAR THREE-FOUR, LUMBAR FOUR-FIVE ANTEROLATERAL LUMBAR INTERBODY FUSION;  Surgeon: Maeola Harman, MD;  Location: [facility] OR;  Service: Neurosurgery;  Laterality: Right;  . BALLOON DILATION N/A 08/27/2021   Procedure: BALLOON DILATION;  Surgeon: Meridee Score Netty Starring., MD;  Location: [facility] ENDOSCOPY;  Service: Gastroenterology;  Laterality: N/A;  . BIOPSY  07/09/2021   Procedure: BIOPSY;  Surgeon: Meridee Score Netty Starring., MD;  Location: [facility] ENDOSCOPY;  Service: Gastroenterology;;  . COLONOSCOPY WITH PROPOFOL N/A 12/29/2022   Procedure: COLONOSCOPY WITH PROPOFOL;  Surgeon: Corbin Ade, MD;  [facility];  Service: Endoscopy;  Laterality: N/A;  . CYST GASTROSTOMY  08/27/2021   Procedure: CYST GASTROSTOMY;  Surgeon: Meridee Score Netty Starring., MD;  Location: [facility] ENDOSCOPY;  Service: Gastroenterology;;  . ESOPHAGOGASTRODUODENOSCOPY N/A 07/09/2021   Procedure:  ESOPHAGOGASTRODUODENOSCOPY (EGD);  Surgeon: Lemar Lofty., MD;  Location: [facility] ENDOSCOPY;  Service: Gastroenterology;  Laterality: N/A;  . ESOPHAGOGASTRODUODENOSCOPY N/A 02/07/2023   Procedure: ESOPHAGOGASTRODUODENOSCOPY (EGD);  Surgeon: Imogene Burn, MD;  Location: [facility] ENDOSCOPY;  Service: Gastroenterology;  Laterality: N/A;  . ESOPHAGOGASTRODUODENOSCOPY (EGD) WITH PROPOFOL N/A 08/27/2021   Procedure: ESOPHAGOGASTRODUODENOSCOPY (EGD) WITH PROPOFOL;  Surgeon: Meridee Score Netty Starring., MD;  Location: [facility] ENDOSCOPY;  Service: Gastroenterology;  Laterality: N/A;  . ESOPHAGOGASTRODUODENOSCOPY (EGD) WITH PROPOFOL N/A 10/08/2021   Procedure: ESOPHAGOGASTRODUODENOSCOPY (EGD) WITH PROPOFOL;  Surgeon: Meridee Score Netty Starring., MD;  Location: [facility] ENDOSCOPY;  Service: Gastroenterology;  Laterality: N/A;  . ESOPHAGOGASTRODUODENOSCOPY (EGD) WITH PROPOFOL N/A 12/29/2022   Procedure: ESOPHAGOGASTRODUODENOSCOPY (EGD) WITH PROPOFOL;  Surgeon: Corbin Ade, MD;  [facility];  Service: Endoscopy;  Laterality: N/A;  . ESOPHAGOGASTRODUODENOSCOPY (EGD) WITH PROPOFOL N/A 01/13/2023   Procedure: ESOPHAGOGASTRODUODENOSCOPY (EGD) WITH PROPOFOL;  Surgeon: Franky Macho, MD;  [facility];  Service: Endoscopy;  Laterality: N/A;  . ESOPHAGOGASTRODUODENOSCOPY (EGD) WITH PROPOFOL N/A 01/30/2023   Procedure: ESOPHAGOGASTRODUODENOSCOPY (EGD) WITH PROPOFOL;  Surgeon: Lanelle Bal, DO;  [facility];  Service: Endoscopy;  Laterality: N/A;  . ESOPHAGOGASTRODUODENOSCOPY (EGD) WITH PROPOFOL N/A 02/18/2023   Procedure: ESOPHAGOGASTRODUODENOSCOPY (EGD) WITH PROPOFOL;  Surgeon: Lynann Bologna, MD;  Location: [facility] ENDOSCOPY;  Service: Gastroenterology;  Laterality: N/A;  . EUS N/A 07/09/2021   Procedure: UPPER ENDOSCOPIC ULTRASOUND (EUS) RADIAL;  Surgeon: Meridee Score Netty Starring., MD;  Location: [facility] ENDOSCOPY;  Service: Gastroenterology;  Laterality: N/A;  . EUS N/A 08/27/2021   Procedure: UPPER  ENDOSCOPIC ULTRASOUND (EUS) LINEAR;  Surgeon: Lemar Lofty., MD;  Location: [facility] ENDOSCOPY;  Service: Gastroenterology;  Laterality: N/A;  . FINE NEEDLE ASPIRATION  07/09/2021   Procedure: FINE NEEDLE ASPIRATION (FNA) LINEAR;  Surgeon: Lemar Lofty., MD;  Location: [facility] ENDOSCOPY;  Service: Gastroenterology;;  . HEMOSTASIS CLIP PLACEMENT  12/29/2022   Procedure: HEMOSTASIS CLIP PLACEMENT;  Surgeon: Corbin Ade, MD;  [facility];  Service: Endoscopy;;  . HEMOSTASIS CLIP PLACEMENT  02/18/2023   Procedure: HEMOSTASIS CLIP PLACEMENT;  Surgeon: Lynann Bologna, MD;  Location: [facility] ENDOSCOPY;  Service: Gastroenterology;;  . HEMOSTASIS CONTROL  02/18/2023   Procedure: HEMOSTASIS CONTROL;  Surgeon: Lynann Bologna, MD;  Location: [facility] ENDOSCOPY;  Service: Gastroenterology;;  . HOT HEMOSTASIS  12/29/2022   Procedure: HOT HEMOSTASIS (ARGON PLASMA COAGULATION/BICAP);  Surgeon: Corbin Ade, MD;  [facility];  Service: Endoscopy;;  . HOT HEMOSTASIS  01/13/2023   Procedure: HOT HEMOSTASIS (ARGON PLASMA COAGULATION/BICAP);  Surgeon: Franky Macho, MD;  [facility];  Service: Endoscopy;;  used apc and goldprobe  . HOT HEMOSTASIS  01/30/2023   Procedure: HOT HEMOSTASIS (ARGON PLASMA COAGULATION/BICAP);  Surgeon: Lanelle Bal, DO;  [facility];  Service: Endoscopy;;  . HOT HEMOSTASIS N/A 02/07/2023   Procedure: HOT HEMOSTASIS (ARGON PLASMA COAGULATION/BICAP);  Surgeon: Imogene Burn, MD;  Location: [facility] ENDOSCOPY;  Service: Gastroenterology;  Laterality: N/A;  . HOT HEMOSTASIS N/A 02/18/2023   Procedure: HOT HEMOSTASIS (ARGON PLASMA COAGULATION/BICAP);  Surgeon: Lynann Bologna, MD;  Location: [facility] ENDOSCOPY;  Service: Gastroenterology;  Laterality: N/A;  . IR EMBO VENOUS NOT HEMORR HEMANG  INC GUIDE ROADMAPPING  02/09/2023  . IR IMAGING GUIDED PORT INSERTION  09/14/2021  . IR RADIOLOGIST EVAL & MGMT  03/04/2023  . IR THROMBECT SEC MECH MOD SED  02/23/2023   . IR THROMBECT VENO MECH MOD SED  02/09/2023  . IR TIPS  02/09/2023  . IR TIPS REVISION MOD SED  02/23/2023  . IR US GUIDE VASC ACCESS LEFT  02/23/2023  . IR US GUIDE VASC ACCESS RIGHT  02/09/2023  . IR US GUIDE VASC ACCESS RIGHT  02/09/2023  . LAPAROSCOPY N/A 02/15/2022   Procedure: STAGING DIAGNOSTIC;  Surgeon: Fritzi Mandes, MD;  Location: [facility] OR;  Service: General;  Laterality: N/A;  . LUMBAR PERCUTANEOUS PEDICLE SCREW 3 LEVEL Bilateral 12/16/2015   Procedure: PERCUTANEOUS PEDICLE SCREWS BILATERALLY AT LUMBAR TWO-FIVE;  Surgeon: Maeola Harman, MD;  Location: [facility] OR;  Service: Neurosurgery;  Laterality: Bilateral;  . PANCREATIC STENT PLACEMENT  08/27/2021   Procedure: PANCREATIC STENT PLACEMENT;  Surgeon: Lemar Lofty., MD;  Location: [facility] ENDOSCOPY;  Service: Gastroenterology;;  . POLYPECTOMY  12/29/2022   Procedure: POLYPECTOMY INTESTINAL;  Surgeon: Corbin Ade, MD;  [facility];  Service: Endoscopy;;  . STENT REMOVAL  10/08/2021   Procedure: STENT REMOVAL;  Surgeon: Lemar Lofty., MD;  Location: [facility] ENDOSCOPY;  Service: Gastroenterology;;  . TIPS PROCEDURE N/A 02/09/2023   Procedure: TRANS-JUGULAR INTRAHEPATIC PORTAL SHUNT (TIPS);  Surgeon: Bennie Dallas, MD;  Location[facility];  Service: Radiology;  Laterality: N/A;  . TUBAL LIGATION  1974  . WHIPPLE PROCEDURE N/A 02/15/2022   Procedure: ATTEMPTED WHIPPLE PROCEDURE;  Surgeon: Fritzi Mandes, MD;  Location: [facility] OR;  Service: General;  Laterality: N/A;    Family History  Problem Relation Age of Onset  . Pancreatitis Brother        alcoholic pancreatitis  . Pancreatic cancer Neg Hx   . Colon cancer Neg Hx     Social History   Tobacco Use  . Smoking status: Never  . Smokeless tobacco: Never  Vaping Use  . Vaping status: Never Used  Substance Use Topics  . Alcohol use: No  . Drug use: Never    Prior to Admission medications   Medication Sig Start Date End Date Taking? Authorizing Provider   acetaminophen (TYLENOL) 500 MG tablet Take 2 tablets (1,000 mg total) by mouth every 8 (eight) hours as needed for moderate pain, headache or fever. Patient taking differently: Take 750-1,000 mg by mouth every 8 (eight) hours as needed for moderate pain (pain score 4-6), headache or fever. 07/21/21  Yes Vassie Loll, MD  buPROPion (WELLBUTRIN XL) 300 MG 24 hr tablet Take 300 mg by mouth daily after breakfast. 06/15/21  Yes [provider]  busPIRone (BUSPAR) 10 MG tablet Take 20 mg by mouth 2 (two) times daily. 01/27/21  Yes [provider]  Cholecalciferol (VITAMIN D3) 125 MCG (5000 UT) CAPS Take 5,000 Units by mouth every morning.   Yes [provider]  escitalopram (LEXAPRO) 20 MG tablet Take 20 mg by mouth at bedtime.   Yes [provider]  lactose free nutrition (BOOST) LIQD Take 237 mLs by mouth 2 (two) times daily between meals.   Yes [provider]  lidocaine-prilocaine (EMLA) cream Apply a small amount to port a cath site (do not rub in) and cover with plastic wrap 1 hour prior to infusion appointments Patient taking differently: Apply 1 Application topically See admin instructions. Apply a small amount to port a cath site (do not rub in) and cover with plastic wrap 1 hour prior to infusion appointments 09/15/21  Yes Doreatha Massed, MD  linaclotide [facility]) 72 MCG capsule Take 1 capsule (72 mcg total) by mouth every other day. In the morning; hold it for diarrhea. Patient taking differently: Take 72 mcg by mouth daily as needed (IBS). In the morning; hold it for diarrhea. 03/13/23  Yes Vassie Loll, MD  LORazepam (ATIVAN) 0.5 MG tablet Take 0.5 mg by mouth every 8 (eight) hours as needed for anxiety, seizure, sedation or sleep. 03/22/23  Yes [provider]  mirtazapine (REMERON) 30 MG tablet Take 15 mg by mouth at bedtime.   Yes [provider]  ondansetron (ZOFRAN) 4 MG tablet Take 4 mg by mouth every 8 (eight) hours as  needed for nausea or vomiting.   Yes [provider]  pantoprazole (PROTONIX) 40 MG tablet Take 1 tablet (40 mg total) by mouth 2 (two) times daily. 03/13/23  Yes Vassie Loll, MD  QUEtiapine (SEROQUEL) 25 MG tablet Take 25 mg by mouth 2 (two) times daily. 12/14/22  Yes [provider]  sucralfate (CARAFATE) 1 g tablet Take 1 tablet (1 g total) by mouth 4 (four) times daily -  with meals and at bedtime for 14 days. 03/01/23 04/11/23 Yes Almon Hercules, MD  ferrous sulfate 325 (65 FE) MG tablet Take 325 mg by mouth daily. Patient not taking: Reported on 04/11/2023 03/16/23   [provider]    Current Facility-Administered Medications  Medication Dose Route Frequency Provider Last Rate Last Admin  . 0.9 %  sodium chloride infusion  Intravenous Continuous Mansy, Vernetta Honey, MD   Stopped at 04/11/23 0110  . acetaminophen (TYLENOL) tablet 650 mg  650 mg Oral Q6H PRN Mansy, Jan A, MD       Or  . acetaminophen (TYLENOL) suppository 650 mg  650 mg Rectal Q6H PRN Mansy, Jan A, MD      . azithromycin (ZITHROMAX) 500 mg in sodium chloride 0.9 % 250 mL IVPB  500 mg Intravenous Q24H Mansy, Jan A, MD      . buPROPion (WELLBUTRIN XL) 24 hr tablet 300 mg  300 mg Oral QPC breakfast Mansy, Jan A, MD      . busPIRone (BUSPAR) tablet 20 mg  20 mg Oral BID Mansy, Jan A, MD   20 mg at 04/10/23 2316  . cefTRIAXone (ROCEPHIN) 2 g in sodium chloride 0.9 % 100 mL IVPB  2 g Intravenous Q24H Mansy, Jan A, MD      . chlorpheniramine-HYDROcodone (TUSSIONEX) 10-8 MG/5ML suspension 5 mL  5 mL Oral Q12H PRN Mansy, Jan A, MD      . cholecalciferol (VITAMIN D3) 25 MCG (1000 UNIT) tablet 10,000 Units  10,000 Units Oral q morning Mansy, Jan A, MD      . escitalopram (LEXAPRO) tablet 20 mg  20 mg Oral QHS Mansy, Jan A, MD   20 mg at 04/10/23 2151  . guaiFENesin (MUCINEX) 12 hr tablet 600 mg  600 mg Oral BID Mansy, Jan A, MD   600 mg at 04/11/23 0223  . ipratropium-albuterol (DUONEB) 0.5-2.5 (3) MG/3ML nebulizer  solution 3 mL  3 mL Nebulization QID Mansy, Jan A, MD   3 mL at 04/11/23 0865  . lactated ringers infusion  150 mL/hr Intravenous Continuous Mansy, Vernetta Honey, MD   Stopped at 04/11/23 0503  . lactose free nutrition (Boost) liquid 237 mL  237 mL Oral BID BM Mansy, Jan A, MD      . linaclotide [facility]) capsule 72 mcg  72 mcg Oral QODAY Mansy, Jan A, MD      . magnesium hydroxide (MILK OF MAGNESIA) suspension 30 mL  30 mL Oral Daily PRN Mansy, Jan A, MD      . mirtazapine (REMERON) tablet 15 mg  15 mg Oral QHS Mansy, Jan A, MD   15 mg at 04/10/23 2151  . morphine (PF) 2 MG/ML injection 2 mg  2 mg Intravenous Q4H PRN Mansy, Jan A, MD      . ondansetron [facility]) tablet 4 mg  4 mg Oral Q6H PRN Mansy, Jan A, MD       Or  . ondansetron [facility]) injection 4 mg  4 mg Intravenous Q6H PRN Mansy, Jan A, MD      . pantoprazole (PROTONIX) injection 40 mg  40 mg Intravenous Q12H Mansy, Jan A, MD   40 mg at 04/11/23 0224  . QUEtiapine (SEROQUEL) tablet 25 mg  25 mg Oral BID Mansy, Jan A, MD   25 mg at 04/10/23 2316  . sucralfate (CARAFATE) tablet 1 g  1 g Oral TID WC & HS Mansy, Jan A, MD   1 g at 04/11/23 0809  . traZODone (DESYREL) tablet 25 mg  25 mg Oral QHS PRN Mansy, Vernetta Honey, MD        Allergies as of 04/10/2023 - Review Complete 04/10/2023  Allergen Reaction Noted  . Other Hives and Other (See Comments) 12/08/2015  . Cherry Hives 03/30/2021  . Wound dressing adhesive Rash and Other (See Comments) 12/08/2015     Review of  Systems:    Constitutional: No weight loss, fever or chills Skin: No rash  Cardiovascular: No chest pain  Respiratory: No SOB  Gastrointestinal: See HPI and otherwise negative Genitourinary: No dysuria  Neurological: No headache, dizziness or syncope Musculoskeletal: No new muscle or joint pain Hematologic: No bleeding  Psychiatric: No history of depression or anxiety    Physical Exam:  Vital signs in last 24 hours: Temp:  [98 F (36.7 C)-98.7 F (37.1 C)] 98.4 F (36.9 C)  (03/17 0922) Pulse Rate:  [89-111] 95 (03/17 0922) Resp:  [12-28] 16 (03/17 0922) BP: (97-127)/(55-89) 100/59 (03/17 0922) SpO2:  [84 %-100 %] 95 % (03/17 0922) Weight:  [58.5 kg-59 kg] (P) 58.5 kg (03/16 1817) Last BM Date : 04/10/23 General:   Pleasant Elderly, chronically ill appearing appears to be in NAD, Well developed, Well nourished, alert and cooperative  Head:  Normocephalic and atraumatic. Eyes:   PEERL, EOMI. No icterus. Conjunctiva pink. Ears:  Normal auditory acuity. Neck:  Supple Throat: Oral cavity and pharynx without inflammation, swelling or lesion. Teeth in good condition. Lungs: Respirations even and unlabored.+increased work of breathing on 2L O2   No wheezes Heart: Normal S1, S2. No MRG. Regular rate and rhythm. No peripheral edema, cyanosis or pallor.  Abdomen:  Soft, nondistended, mild generalized ttp, No rebound or guarding. Normal bowel sounds. No appreciable masses or hepatomegaly. Rectal:  Not performed.  Msk:  Symmetrical without gross deformities. Peripheral pulses intact.  Extremities:  Without edema, no deformity or joint abnormality. Normal ROM, normal sensation. Neurologic:  Alert and  oriented x4;  grossly normal neurologically. CN II-XII intact.  Skin:   Dry and intact without significant lesions or rashes. Psychiatric: Demonstrates good judgement and reason without abnormal affect or behaviors.  LAB RESULTS: Recent Labs    04/10/23 1522 04/11/23 0456  WBC 6.4 4.6  HGB 8.1* 7.0*  HCT 25.6* 22.2*  PLT 97* 75*   BMET Recent Labs    04/10/23 1522 04/11/23 0456  NA 135 137  K 3.4* 3.5  CL 107 109  CO2 19* 23  GLUCOSE 158* 108*  BUN 16 13  CREATININE 0.75 0.68  CALCIUM 8.5* 8.2*      Latest Ref Rng & Units 04/10/2023    3:22 PM 03/12/2023   12:20 PM 02/27/2023    4:08 AM  Hepatic Function  Total Protein 6.5 - 8.1 g/dL 5.4  6.2    Albumin 3.5 - 5.0 g/dL 2.9  3.2  1.9   AST 15 - 41 U/L 500  39    ALT 0 - 44 U/L 542  33    Alk  Phosphatase 38 - 126 U/L 605  97    Total Bilirubin 0.0 - 1.2 mg/dL 4.3  0.6       PT/INR Recent Labs    04/10/23 1706 04/10/23 2251  LABPROT 14.7 15.0  INR 1.1 1.2    STUDIES: CT ABDOMEN PELVIS W CONTRAST Result Date: 04/10/2023 CLINICAL DATA:  Pancreatic cancer. Portal vein thrombosis. Thrombosed tips shunt identified on comparison CT. EXAM: CT ABDOMEN AND PELVIS WITH CONTRAST TECHNIQUE: Multidetector CT imaging of the abdomen and pelvis was performed using the standard protocol following bolus administration of intravenous contrast. RADIATION DOSE REDUCTION: This exam was performed according to the departmental dose-optimization program which includes automated exposure control, adjustment of the mA and/or kV according to patient size and/or use of iterative reconstruction technique. CONTRAST:  OMNIPAQUE IOHEXOL 300 MG/ML  SOLN COMPARISON:  CT a 02/25/2023 FINDINGS:  Lower chest: Bilateral pleural effusions with associated bibasilar atelectasis. No pulmonary edema. Pneumonia. Hepatobiliary: Again demonstrated the tip shunt is thrombosed. Thrombus extends into the IVC above the tip shunt below the atrium (image 5/series 5.) the portal veins are thrombosed. There is a moderate volume of free fluid along the margin of the liver similar comparison exam. Small amount of fluid along the LEFT and RIGHT pericolic gutter which collects the pelvis volume ascites similar to prior CT normal volume spleen Pancreas: Pancreas demonstrates chronic dilatation of the duct and atrophy. There is a mass the pancreas unchanged measuring 18 mm image 29/5. Spleen: Normal volume spleen Adrenals/urinary tract: Adrenal glands and kidneys are normal. The ureters and bladder normal. Stomach/Bowel: Stomach, small bowel, appendix, and cecum are normal. The colon and rectosigmoid colon are normal. Vascular/Lymphatic: Aorta is normal. Thrombosis of the portal vein as above. Reproductive: Uterus unremarkable Other: Moderate  volume of free fluid/ascites similar to comparison CT Musculoskeletal: No aggressive osseous lesion. IMPRESSION: 1. No significant change from CT 02/25/2023. 2. 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 3. Moderate to large volume ascites not changed in volume. 4. Pancreatic head mass unchanged. 5. No evidence of bowel obstruction. 6. Bilateral pleural effusions bibasilar atelectasis. No evidence of pneumonia. Electronically Signed   By: Genevive Bi M.D.   On: 04/10/2023 19:11   DG Chest Portable 1 View Result Date: 04/10/2023 CLINICAL DATA:  Short of breath EXAM: PORTABLE CHEST 1 VIEW COMPARISON:  None Available. FINDINGS: Port in the anterior chest wall with tip in distal SVC. Density of the RIGHT cardiophrenic angle. LEFT basilar atelectasis. Upper lungs are clear. No pneumothorax. IMPRESSION: Medial RIGHT lower alobe density representing pneumonia, atelectasis or aspiration pneumonitis. Electronically Signed   By: Genevive Bi M.D.   On: 04/10/2023 15:38    Impression / Plan:   Impression: 1.  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  Sepsis due to pneumonia: Manifested by tachycardia and tachypnea on antibiotics with IV Rocephin and Zithromax, causing SOB 3.  Symptomatic anemia: Associated with positive Hemoccult in the setting of known GAVE treated 6 times endoscopically since December 4.  Elevated troponins: No chest pain, likely demand ischemia 5. Chronic constipation: on Linzess prn  Plan: 1.  Continue to monitor hemoglobin and transfusion as needed less than 7 2.  Continue antibiotics for pneumonia 3.  Discussed case with Dr. Doy Hutching today.  We need to readdress goals of care for this patient.  At this point any additional biliary procedure may cause more harm than good especially since she is not  experiencing any side effects from bile duct obstruction at this point. 4.  Continue to monitor LFTs. 5.  From a GI perspective okay for regular diet 6.  Currently reports constipation.  Told her to ask nursing staff for dose of her Linzess which is on her med list  Thank you for your kind consultation, we will continue to follow.  Violet Baldy Tujuana Kilmartin  04/11/2023, 11:05 AM

## 2023-04-11 NOTE — Assessment & Plan Note (Signed)
-   We will follow serial hemoglobins and hematocrits. - This is associated with positive stool Hemoccult indicating an element of GI bleeding. - GI consultation will be obtained. - Dr. Marina Goodell was paged about the patient. - We will follow serial hemoglobins and hematocrits. - We will place her on IV PPI therapy.

## 2023-04-11 NOTE — H&P (Incomplete)
 [place]   PATIENT NAME: Sheryl Bender    MR#:  [id number]  DATE OF BIRTH:  [birth year]  DATE OF ADMISSION:  04/10/2023  PRIMARY CARE PHYSICIAN: [name], DO   Patient is coming from: Home  REQUESTING/REFERRING PHYSICIAN: ***  CHIEF COMPLAINT:   Chief Complaint  Patient presents with  . Shortness of Breath    HISTORY OF PRESENT ILLNESS:  Karilyn Wind is a 78 y.o. female with medical history significant for ***  ED Course: *** EKG as reviewed by me : *** Imaging: *** PAST MEDICAL HISTORY:   Past Medical History:  Diagnosis Date  . Anxiety   . Arthritis   . Depression   . Dysrhythmia    hx palpitations greater than 5 yrs -neg echo, stress per pt ? where or dr  . GERD (gastroesophageal reflux disease)    occ  . Nausea & vomiting 08/24/2021  . Pancreatic adenocarcinoma (HCC)   . Pancreatic pseudocyst   . Port-A-Cath in place 09/15/2021  . Pulmonary embolism [facility])    September 2022    PAST SURGICAL HISTORY:   Past Surgical History:  Procedure Laterality Date  . ANTERIOR LAT LUMBAR FUSION Right 12/16/2015   Procedure: RIGHT LUMBAR TWO-THREE, LUMBAR THREE-FOUR, LUMBAR FOUR-FIVE ANTEROLATERAL LUMBAR INTERBODY FUSION;  Surgeon: Maeola Harman, MD;  Location: [facility] OR;  Service: Neurosurgery;  Laterality: Right;  . BALLOON DILATION N/A 08/27/2021   Procedure: BALLOON DILATION;  Surgeon: Meridee Score Netty Starring., MD;  Location: [facility] ENDOSCOPY;  Service: Gastroenterology;  Laterality: N/A;  . BIOPSY  07/09/2021   Procedure: BIOPSY;  Surgeon: Meridee Score Netty Starring., MD;  Location: [facility] ENDOSCOPY;  Service: Gastroenterology;;  . COLONOSCOPY WITH PROPOFOL N/A 12/29/2022   Procedure: COLONOSCOPY WITH PROPOFOL;  Surgeon: Corbin Ade, MD;  Location: [facility];  Service: Endoscopy;  Laterality: N/A;  . CYST GASTROSTOMY  08/27/2021   Procedure: CYST GASTROSTOMY;  Surgeon: Meridee Score Netty Starring., MD;  Location: [facility] ENDOSCOPY;  Service: Gastroenterology;;  .  ESOPHAGOGASTRODUODENOSCOPY N/A 07/09/2021   Procedure: ESOPHAGOGASTRODUODENOSCOPY (EGD);  Surgeon: Lemar Lofty., MD;  Location: [facility] ENDOSCOPY;  Service: Gastroenterology;  Laterality: N/A;  . ESOPHAGOGASTRODUODENOSCOPY N/A 02/07/2023   Procedure: ESOPHAGOGASTRODUODENOSCOPY (EGD);  Surgeon: Imogene Burn, MD;  Location: [facility] ENDOSCOPY;  Service: Gastroenterology;  Laterality: N/A;  . ESOPHAGOGASTRODUODENOSCOPY (EGD) WITH PROPOFOL N/A 08/27/2021   Procedure: ESOPHAGOGASTRODUODENOSCOPY (EGD) WITH PROPOFOL;  Surgeon: Meridee Score Netty Starring., MD;  Location: [facility] ENDOSCOPY;  Service: Gastroenterology;  Laterality: N/A;  . ESOPHAGOGASTRODUODENOSCOPY (EGD) WITH PROPOFOL N/A 10/08/2021   Procedure: ESOPHAGOGASTRODUODENOSCOPY (EGD) WITH PROPOFOL;  Surgeon: Meridee Score Netty Starring., MD;  Location: [facility] ENDOSCOPY;  Service: Gastroenterology;  Laterality: N/A;  . ESOPHAGOGASTRODUODENOSCOPY (EGD) WITH PROPOFOL N/A 12/29/2022   Procedure: ESOPHAGOGASTRODUODENOSCOPY (EGD) WITH PROPOFOL;  Surgeon: Corbin Ade, MD;  [facility];  Service: Endoscopy;  Laterality: N/A;  . ESOPHAGOGASTRODUODENOSCOPY (EGD) WITH PROPOFOL N/A 01/13/2023   Procedure: ESOPHAGOGASTRODUODENOSCOPY (EGD) WITH PROPOFOL;  Surgeon: Franky Macho, MD;  [facility];  Service: Endoscopy;  Laterality: N/A;  . ESOPHAGOGASTRODUODENOSCOPY (EGD) WITH PROPOFOL N/A 01/30/2023   Procedure: ESOPHAGOGASTRODUODENOSCOPY (EGD) WITH PROPOFOL;  Surgeon: Lanelle Bal, DO;  [facility];  Service: Endoscopy;  Laterality: N/A;  . ESOPHAGOGASTRODUODENOSCOPY (EGD) WITH PROPOFOL N/A 02/18/2023   Procedure: ESOPHAGOGASTRODUODENOSCOPY (EGD) WITH PROPOFOL;  Surgeon: Lynann Bologna, MD;  Location: [facility] ENDOSCOPY;  Service: Gastroenterology;  Laterality: N/A;  . EUS N/A 07/09/2021   Procedure: UPPER ENDOSCOPIC ULTRASOUND (EUS) RADIAL;  Surgeon: Lemar Lofty.,  MD;  Location: [facility] ENDOSCOPY;  Service: Gastroenterology;  Laterality:  N/A;  . EUS N/A 08/27/2021   Procedure: UPPER ENDOSCOPIC ULTRASOUND (EUS) LINEAR;  Surgeon: Lemar Lofty., MD;  Location: [facility] ENDOSCOPY;  Service: Gastroenterology;  Laterality: N/A;  . FINE NEEDLE ASPIRATION  07/09/2021   Procedure: FINE NEEDLE ASPIRATION (FNA) LINEAR;  Surgeon: Lemar Lofty., MD;  Location: [facility] ENDOSCOPY;  Service: Gastroenterology;;  . HEMOSTASIS CLIP PLACEMENT  12/29/2022   Procedure: HEMOSTASIS CLIP PLACEMENT;  Surgeon: Corbin Ade, MD;  [facility];  Service: Endoscopy;;  . HEMOSTASIS CLIP PLACEMENT  02/18/2023   Procedure: HEMOSTASIS CLIP PLACEMENT;  Surgeon: Lynann Bologna, MD;  Location: [facility] ENDOSCOPY;  Service: Gastroenterology;;  . HEMOSTASIS CONTROL  02/18/2023   Procedure: HEMOSTASIS CONTROL;  Surgeon: Lynann Bologna, MD;  Location: [facility] ENDOSCOPY;  Service: Gastroenterology;;  . HOT HEMOSTASIS  12/29/2022   Procedure: HOT HEMOSTASIS (ARGON PLASMA COAGULATION/BICAP);  Surgeon: Corbin Ade, MD;  [facility];  Service: Endoscopy;;  . HOT HEMOSTASIS  01/13/2023   Procedure: HOT HEMOSTASIS (ARGON PLASMA COAGULATION/BICAP);  Surgeon: Franky Macho, MD;  [facility];  Service: Endoscopy;;  used apc and goldprobe  . HOT HEMOSTASIS  01/30/2023   Procedure: HOT HEMOSTASIS (ARGON PLASMA COAGULATION/BICAP);  Surgeon: Lanelle Bal, DO;  [facility];  Service: Endoscopy;;  . HOT HEMOSTASIS N/A 02/07/2023   Procedure: HOT HEMOSTASIS (ARGON PLASMA COAGULATION/BICAP);  Surgeon: Imogene Burn, MD;  Location: [facility] ENDOSCOPY;  Service: Gastroenterology;  Laterality: N/A;  . HOT HEMOSTASIS N/A 02/18/2023   Procedure: HOT HEMOSTASIS (ARGON PLASMA COAGULATION/BICAP);  Surgeon: Lynann Bologna, MD;  Location: [facility] ENDOSCOPY;  Service: Gastroenterology;  Laterality: N/A;  . IR EMBO VENOUS NOT HEMORR HEMANG  INC GUIDE ROADMAPPING  02/09/2023  . IR IMAGING GUIDED PORT INSERTION  09/14/2021  . IR RADIOLOGIST EVAL & MGMT  03/04/2023   . IR THROMBECT SEC MECH MOD SED  02/23/2023  . IR THROMBECT VENO MECH MOD SED  02/09/2023  . IR TIPS  02/09/2023  . IR TIPS REVISION MOD SED  02/23/2023  . IR US GUIDE VASC ACCESS LEFT  02/23/2023  . IR US GUIDE VASC ACCESS RIGHT  02/09/2023  . IR US GUIDE VASC ACCESS RIGHT  02/09/2023  . LAPAROSCOPY N/A 02/15/2022   Procedure: STAGING DIAGNOSTIC;  Surgeon: Fritzi Mandes, MD;  Location: [facility] OR;  Service: General;  Laterality: N/A;  . LUMBAR PERCUTANEOUS PEDICLE SCREW 3 LEVEL Bilateral 12/16/2015   Procedure: PERCUTANEOUS PEDICLE SCREWS BILATERALLY AT LUMBAR TWO-FIVE;  Surgeon: Maeola Harman, MD;  Location: [facility] OR;  Service: Neurosurgery;  Laterality: Bilateral;  . PANCREATIC STENT PLACEMENT  08/27/2021   Procedure: PANCREATIC STENT PLACEMENT;  Surgeon: Lemar Lofty., MD;  Location: [facility] ENDOSCOPY;  Service: Gastroenterology;;  . POLYPECTOMY  12/29/2022   Procedure: POLYPECTOMY INTESTINAL;  Surgeon: Corbin Ade, MD;  [facility];  Service: Endoscopy;;  . STENT REMOVAL  10/08/2021   Procedure: STENT REMOVAL;  Surgeon: Lemar Lofty., MD;  Location: [facility] ENDOSCOPY;  Service: Gastroenterology;;  . TIPS PROCEDURE N/A 02/09/2023   Procedure: TRANS-JUGULAR INTRAHEPATIC PORTAL SHUNT (TIPS);  Surgeon: Bennie Dallas, MD;  Location: [facility] OR;  Service: Radiology;  Laterality: N/A;  . TUBAL LIGATION  1974  . WHIPPLE PROCEDURE N/A 02/15/2022   Procedure: ATTEMPTED WHIPPLE PROCEDURE;  Surgeon: Fritzi Mandes, MD;  Location: [facility] OR;  Service: General;  Laterality: N/A;    SOCIAL HISTORY:   Social History  Tobacco Use  . Smoking status: Never  . Smokeless tobacco: Never  Substance Use Topics  . Alcohol use: No    FAMILY HISTORY:   Family History  Problem Relation Age of Onset  . Pancreatitis Brother        alcoholic pancreatitis  . Pancreatic cancer Neg Hx   . Colon cancer Neg Hx     DRUG ALLERGIES:   Allergies  Allergen Reactions  . Other Hives and Other (See  Comments)    Cherry wood just cut- "smelled it and broke out"; cannot tolerate ANY cherry fragrances, either     . Cherry Hives  . Wound Dressing Adhesive Rash and Other (See Comments)    Band-Aids = local reaction    REVIEW OF SYSTEMS:   ROS As per history of present illness. All pertinent systems were reviewed above. Constitutional, HEENT, cardiovascular, respiratory, GI, GU, musculoskeletal, neuro, psychiatric, endocrine, integumentary and hematologic systems were reviewed and are otherwise negative/unremarkable except for positive findings mentioned above in the HPI.   MEDICATIONS AT HOME:   Prior to Admission medications   Medication Sig Start Date End Date Taking? Authorizing Provider  acetaminophen (TYLENOL) 500 MG tablet Take 2 tablets (1,000 mg total) by mouth every 8 (eight) hours as needed for moderate pain, headache or fever. Patient taking differently: Take 750-1,000 mg by mouth every 8 (eight) hours as needed for moderate pain (pain score 4-6), headache or fever. 07/21/21   Vassie Loll, MD  benzonatate (TESSALON) 100 MG capsule Take 1 capsule (100 mg total) by mouth 3 (three) times daily as needed for cough. 03/01/23   Almon Hercules, MD  buPROPion (WELLBUTRIN XL) 300 MG 24 hr tablet Take 300 mg by mouth daily after breakfast. 06/15/21   [provider]  busPIRone (BUSPAR) 10 MG tablet Take 20 mg by mouth 2 (two) times daily. 01/27/21   [provider]  Cholecalciferol (VITAMIN D3) 125 MCG (5000 UT) CAPS Take 10,000 Units by mouth every morning.    [provider]  escitalopram (LEXAPRO) 20 MG tablet Take 20 mg by mouth at bedtime.    [provider]  lactose free nutrition (BOOST) LIQD Take 237 mLs by mouth 2 (two) times daily between meals.    [provider]  lidocaine-prilocaine (EMLA) cream Apply a small amount to port a cath site (do not rub in) and cover with plastic wrap 1 hour prior to infusion appointments 09/15/21    Doreatha Massed, MD  linaclotide [facility]) 72 MCG capsule Take 1 capsule (72 mcg total) by mouth every other day. In the morning; hold it for diarrhea. 03/13/23   Vassie Loll, MD  mirtazapine (REMERON) 30 MG tablet Take 15 mg by mouth at bedtime.    [provider]  ondansetron (ZOFRAN) 4 MG tablet Take 4 mg by mouth every 8 (eight) hours as needed for nausea or vomiting.    [provider]  pantoprazole (PROTONIX) 40 MG tablet Take 1 tablet (40 mg total) by mouth 2 (two) times daily. 03/13/23   Vassie Loll, MD  QUEtiapine (SEROQUEL) 25 MG tablet Take 25 mg by mouth 2 (two) times daily. 12/14/22   [provider]  sucralfate (CARAFATE) 1 g tablet Take 1 tablet (1 g total) by mouth 4 (four) times daily -  with meals and at bedtime for 14 days. 03/01/23 03/15/23  Almon Hercules, MD      VITAL SIGNS:  Blood pressure 116/63, pulse 96, temperature 98.6 F (37 C), resp. rate Marland Kitchen)  24, height (P) 5\' 2"  (1.575 m), weight (P) 58.5 kg, SpO2 91%.  PHYSICAL EXAMINATION:  Physical Exam  GENERAL:  78 y.o.-year-old patient lying in the bed with no acute distress.  EYES: Pupils equal, round, reactive to light and accommodation. No scleral icterus. Extraocular muscles intact.  HEENT: Head atraumatic, normocephalic. Oropharynx and nasopharynx clear.  NECK:  Supple, no jugular venous distention. No thyroid enlargement, no tenderness.  LUNGS: Normal breath sounds bilaterally, no wheezing, rales,rhonchi or crepitation. No use of accessory muscles of respiration.  CARDIOVASCULAR: Regular rate and rhythm, S1, S2 normal. No murmurs, rubs, or gallops.  ABDOMEN: Soft, nondistended, nontender. Bowel sounds present. No organomegaly or mass.  EXTREMITIES: No pedal edema, cyanosis, or clubbing.  NEUROLOGIC: Cranial nerves II through XII are intact. Muscle strength 5/5 in all extremities. Sensation intact. Gait not checked.  PSYCHIATRIC: The patient is alert and oriented x 3.  Normal affect  and good eye contact. SKIN: No obvious rash, lesion, or ulcer.   LABORATORY PANEL:   CBC Recent Labs  Lab 04/10/23 1522  WBC 6.4  HGB 8.1*  HCT 25.6*  PLT 97*   ------------------------------------------------------------------------------------------------------------------  Chemistries  Recent Labs  Lab 04/10/23 1522  NA 135  K 3.4*  CL 107  CO2 19*  GLUCOSE 158*  BUN 16  CREATININE 0.75  CALCIUM 8.5*  AST 500*  ALT 542*  ALKPHOS 605*  BILITOT 4.3*   ------------------------------------------------------------------------------------------------------------------  Cardiac Enzymes No results for input(s): "TROPONINI" in the last 168 hours. ------------------------------------------------------------------------------------------------------------------  RADIOLOGY:  CT ABDOMEN PELVIS W CONTRAST Result Date: 04/10/2023 CLINICAL DATA:  Pancreatic cancer. Portal vein thrombosis. Thrombosed tips shunt identified on comparison CT. EXAM: CT ABDOMEN AND PELVIS WITH CONTRAST TECHNIQUE: Multidetector CT imaging of the abdomen and pelvis was performed using the standard protocol following bolus administration of intravenous contrast. RADIATION DOSE REDUCTION: This exam was performed according to the departmental dose-optimization program which includes automated exposure control, adjustment of the mA and/or kV according to patient size and/or use of iterative reconstruction technique. CONTRAST:  OMNIPAQUE IOHEXOL 300 MG/ML  SOLN COMPARISON:  CT a 02/25/2023 FINDINGS: Lower chest: Bilateral pleural effusions with associated bibasilar atelectasis. No pulmonary edema. Pneumonia. Hepatobiliary: Again demonstrated the tip shunt is thrombosed. Thrombus extends into the IVC above the tip shunt below the atrium (image 5/series 5.) the portal veins are thrombosed. There is a moderate volume of free fluid along the margin of the liver similar comparison exam. Small amount of fluid along  the LEFT and RIGHT pericolic gutter which collects the pelvis volume ascites similar to prior CT normal volume spleen Pancreas: Pancreas demonstrates chronic dilatation of the duct and atrophy. There is a mass the pancreas unchanged measuring 18 mm image 29/5. Spleen: Normal volume spleen Adrenals/urinary tract: Adrenal glands and kidneys are normal. The ureters and bladder normal. Stomach/Bowel: Stomach, small bowel, appendix, and cecum are normal. The colon and rectosigmoid colon are normal. Vascular/Lymphatic: Aorta is normal. Thrombosis of the portal vein as above. Reproductive: Uterus unremarkable Other: Moderate volume of free fluid/ascites similar to comparison CT Musculoskeletal: No aggressive osseous lesion. IMPRESSION: 1. No significant change from CT 02/25/2023. 2. 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 3. Moderate to large volume ascites not changed in volume. 4. Pancreatic head mass unchanged. 5. No evidence of bowel obstruction. 6. Bilateral pleural effusions bibasilar atelectasis. No evidence of pneumonia. Electronically Signed   By: Genevive Bi M.D.   On: 04/10/2023 19:11   DG Chest Portable 1 View Result  Date: 04/10/2023 CLINICAL DATA:  Short of breath EXAM: PORTABLE CHEST 1 VIEW COMPARISON:  None Available. FINDINGS: Port in the anterior chest wall with tip in distal SVC. Density of the RIGHT cardiophrenic angle. LEFT basilar atelectasis. Upper lungs are clear. No pneumothorax. IMPRESSION: Medial RIGHT lower alobe density representing pneumonia, atelectasis or aspiration pneumonitis. Electronically Signed   By: Genevive Bi M.D.   On: 04/10/2023 15:38      IMPRESSION AND PLAN:  Assessment and Plan: No notes have been filed under this hospital service. Service: Hospitalist      DVT prophylaxis: Lovenox***  Advanced Care Planning:  Code Status: full code***  Family Communication:  The plan of care was discussed in details with the patient (and  family). I answered all questions. The patient agreed to proceed with the above mentioned plan. Further management will depend upon hospital course. Disposition Plan: Back to previous home environment Consults called: none***  All the records are reviewed and case discussed with ED provider.  Status is: Inpatient {Inpatient:23812}   At the time of the admission, it appears that the appropriate admission status for this patient is inpatient.  This is judged to be reasonable and necessary in order to provide the required intensity of service to ensure the patient's safety given the presenting symptoms, physical exam findings and initial radiographic and laboratory data in the context of comorbid conditions.  The patient requires inpatient status due to high intensity of service, high risk of further deterioration and high frequency of surveillance required.  I certify that at the time of admission, it is my clinical judgment that the patient will require inpatient hospital care extending more than 2 midnights.                            Dispo: The patient is from: Home              Anticipated d/c is to: Home              Patient currently is not medically stable to d/c.              Difficult to place patient: No  Hannah Beat M.D on 04/10/2023 at 9:03 PM  [facility]   From 7 PM-7 AM, contact night-coverage [email or web address]  CC: Primary care physician; [name], DO

## 2023-04-12 DIAGNOSIS — J9601 Acute respiratory failure with hypoxia: Secondary | ICD-10-CM | POA: Diagnosis not present

## 2023-04-12 DIAGNOSIS — K831 Obstruction of bile duct: Secondary | ICD-10-CM | POA: Diagnosis not present

## 2023-04-12 DIAGNOSIS — R7401 Elevation of levels of liver transaminase levels: Secondary | ICD-10-CM | POA: Diagnosis not present

## 2023-04-12 DIAGNOSIS — K922 Gastrointestinal hemorrhage, unspecified: Secondary | ICD-10-CM | POA: Diagnosis not present

## 2023-04-12 LAB — BPAM RBC
Blood Product Expiration Date: 202504082359
ISSUE DATE / TIME: 202503172100
Unit Type and Rh: 6200

## 2023-04-12 LAB — CBC
HCT: 23.8 % — ABNORMAL LOW (ref 36.0–46.0)
Hemoglobin: 7.5 g/dL — ABNORMAL LOW (ref 12.0–15.0)
MCH: 32.1 pg (ref 26.0–34.0)
MCHC: 31.5 g/dL (ref 30.0–36.0)
MCV: 101.7 fL — ABNORMAL HIGH (ref 80.0–100.0)
Platelets: 68 10*3/uL — ABNORMAL LOW (ref 150–400)
RBC: 2.34 MIL/uL — ABNORMAL LOW (ref 3.87–5.11)
RDW: 22.5 % — ABNORMAL HIGH (ref 11.5–15.5)
WBC: 4.4 10*3/uL (ref 4.0–10.5)
nRBC: 0 % (ref 0.0–0.2)

## 2023-04-12 LAB — TYPE AND SCREEN
ABO/RH(D): A POS
Antibody Screen: NEGATIVE
Unit division: 0

## 2023-04-12 LAB — PREPARE RBC (CROSSMATCH)

## 2023-04-12 MED ORDER — MORPHINE 100MG IN NS 100ML (1MG/ML) PREMIX INFUSION
4.0000 mg/h | INTRAVENOUS | Status: DC
  Administered 2023-04-12: 1 mg/h via INTRAVENOUS
  Administered 2023-04-14: 2 mg/h via INTRAVENOUS
  Administered 2023-04-16 – 2023-04-17 (×2): 4 mg/h via INTRAVENOUS
  Filled 2023-04-12 (×3): qty 100

## 2023-04-12 MED ORDER — POLYETHYLENE GLYCOL 3350 17 G PO PACK: 17.0000 g | PACK | Freq: Two times a day (BID) | ORAL | Status: DC

## 2023-04-12 MED ORDER — ONDANSETRON 4 MG PO TBDP: 4.0000 mg | ORAL_TABLET | Freq: Four times a day (QID) | ORAL | Status: DC | PRN

## 2023-04-12 MED ORDER — HALOPERIDOL LACTATE 5 MG/ML IJ SOLN: 0.5000 mg | INTRAMUSCULAR | Status: DC | PRN

## 2023-04-12 MED ORDER — MORPHINE 100MG IN NS 100ML (1MG/ML) PREMIX INFUSION
INTRAVENOUS | Status: AC
  Filled 2023-04-12: qty 100

## 2023-04-12 MED ORDER — SIMETHICONE 80 MG PO CHEW
80.0000 mg | CHEWABLE_TABLET | Freq: Four times a day (QID) | ORAL | Status: DC | PRN
  Administered 2023-04-12: 80 mg via ORAL
  Filled 2023-04-12: qty 1

## 2023-04-12 MED ORDER — ONDANSETRON HCL 4 MG/2ML IJ SOLN: 4.0000 mg | Freq: Four times a day (QID) | INTRAMUSCULAR | Status: DC | PRN

## 2023-04-12 MED ORDER — LORAZEPAM 2 MG/ML PO CONC
1.0000 mg | ORAL | Status: DC | PRN
  Administered 2023-04-12 (×2): 1 mg via SUBLINGUAL
  Filled 2023-04-12 (×2): qty 1

## 2023-04-12 MED ORDER — LORAZEPAM 2 MG/ML IJ SOLN: 1.0000 mg | Freq: Once | INTRAMUSCULAR | Status: AC

## 2023-04-12 MED ORDER — MORPHINE 100MG IN NS 100ML (1MG/ML) PREMIX INFUSION: 1.0000 mg/h | INTRAVENOUS | Status: DC

## 2023-04-12 MED ORDER — LORAZEPAM 1 MG PO TABS: 1.0000 mg | ORAL_TABLET | ORAL | Status: DC | PRN

## 2023-04-12 MED ORDER — MORPHINE SULFATE (PF) 4 MG/ML IV SOLN: 4.0000 mg | INTRAVENOUS | Status: AC

## 2023-04-12 MED ORDER — HALOPERIDOL 0.5 MG PO TABS: 0.5000 mg | ORAL_TABLET | ORAL | Status: DC | PRN

## 2023-04-12 MED ORDER — HALOPERIDOL LACTATE 2 MG/ML PO CONC: 0.5000 mg | ORAL | Status: DC | PRN

## 2023-04-12 MED ORDER — LORAZEPAM 2 MG/ML IJ SOLN
1.0000 mg | INTRAMUSCULAR | Status: DC | PRN
  Administered 2023-04-15: 1 mg via INTRAVENOUS
  Filled 2023-04-12 (×2): qty 1

## 2023-04-12 MED ORDER — TRAZODONE HCL 50 MG PO TABS: 25.0000 mg | ORAL_TABLET | Freq: Every evening | ORAL | Status: DC | PRN

## 2023-04-12 NOTE — Progress Notes (Signed)
 Pt called out stating that she was unable to breathe. This RN responded to the room and found pt on 4L [place], tachypneic with cyanosis to her nailbeds. Pt stated she was scared and couldn't catch her breath. Oxygen saturation was measured and found to be at 72. Pt is DNR- Limited, Rapid response nurse called to respond to bedside. Primary nurse Tamekia LPN responded to the bedside. Pt oxygen saturation had decreased to 60, Pt was placed on non re breather and oxygen increased to max. PRN Duoneb administered by Primary nurse. Rapid Response nurse and respiratory to the room. MD was paged and arrived to bedside.

## 2023-04-12 NOTE — Progress Notes (Signed)
   04/12/23 1133  Spiritual Encounters  Type of Visit Follow up;Initial  Care provided to: Pt and family  Conversation partners present during encounter Nurse  Referral source Nurse (RN/NT/LPN)  Reason for visit End-of-life   Reason for Visit: Chaplain responding to Page from the floor that EOL Pt is requesting a chaplain.  Time of Visit: 45 Minutes  Description of Visit: Chaplain arrived in room to find Pt sitting up in bed and her RN is holding her hand.  Pt is receiving the max amount of oxygen that can be provided and is still struggling to get enough air.  She is very anxious and needing help to calm down.   Chaplain moves to opposite side of the bed and takes Pt's other hand.  Pt is difficult to hear over the sound of the oxygen and she has trouble hearing [place].  Chaplain offers prayer, but Pt continues to be anxious.   Chaplain continues to hold Pt's hand and attempt to speak words of comfort.  Chaplain decides to sing an old hymn the Pt may know.  Pt did not know hymn but thought it was beautiful.  RN uses the computer to play [employer] hymns and chaplain and Pt sing along.  In a short while Pt's anxiety is dissipated.   Chaplain remains with Pt until son arrives at bedside.  Chaplain excuses himself to allow son to have these last moments with his mother.  They have been instructed what to do if they seek a chaplain to return.   Plan of Care: No further spiritual care services are planned at this time unless the Pt reaches out and asks for [place] to return.  Chaplain services remain available by Spiritual Consult or for emergent cases, paging [phone]  Chaplain Raelene Bott, MDiv [email or web address] [phone]

## 2023-04-12 NOTE — Progress Notes (Signed)
 Pt was able to eat some chicken noddle soup and orange sherbet while on the nasal cannula on 10L. Oxygen level range from 66-72% on the nasal cannula, place pt back on the non- re breather for comfort.

## 2023-04-12 NOTE — Progress Notes (Signed)
 Spoke with Suzette Battiest, LPN about concerns regarding the documentation on the blood for the patient. She did have several nurses try to help her with fixing the end time to no avail. Instructed to make a note to support.

## 2023-04-12 NOTE — Progress Notes (Signed)
 [facility] PROGRESS NOTE    Progress Note  Sheryl Bender  UVO:[id number] DOB: [birth year] DOA: 04/10/2023 PCP: [name], DO     Brief Narrative:   Sheryl Bender is an 78 y.o. female has medical history significant for pancreatic cancer not on chemotherapy followed by [name], osteoarthritis and PE not on anticoagulation recently admitted last month for GI bleed which required packed red blood cell transfusion presents to the emergency room for weakness associated with abdominal pain.  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 Use Ativan as needed. The patient will probably pass away in house.  Acute respiratory failure with hypoxia likely multifactorial aspiration pneumonia plus minus acute PE: This morning she became acutely short of breath tachycardic and struggling to breathe. She was placed on nonrebreather and her saturations were in the 30s and 40s.  She was breathing about 30-40 times per minute. Spoke to the son that she probably had a new PE and she was not a candidate for anticoagulation due to history of GI bleed. I talked the patient and the son and they both agreed to move towards comfort measures. All medications not related to comfort were discontinued. She was given 4 mg of morphine and started on a morphine drip and breathing well down she was more comfortable.  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 GI saw the patient and again recommended transfer to [facility]. GI was consulted recommended no further interventions. They recommended hospice to get involved.   She is being followed by palliative care as an outpatient.  Normocytic anemia: Status post one 1 unit of packed blood cells her hemoglobin this morning 7.5.  Elevated troponins: Anxiety and depression: Thrombocytopenia:  DVT prophylaxis: scd Family Communication:none Status is: Inpatient Remains inpatient appropriate because: Shortness of breath    Code Status:     Code Status Orders  (From admission, onward)           Start     Ordered   04/10/23 2102  Do not attempt resuscitation (DNR)- Limited -Do Not Intubate (DNI)  Continuous       Question Answer Comment  If pulseless and not breathing No CPR or chest compressions.   In Pre-Arrest Conditions (Patient Is Breathing and Has A Pulse) Do not intubate. Provide all appropriate non-invasive medical interventions. Avoid ICU transfer unless indicated or required.   Consent: Discussion documented in EHR or advanced directives reviewed      04/10/23 2102           Code Status History     Date Active Date Inactive Code Status Order ID Comments User Context   03/12/2023 1435 03/13/2023 2221 Limited: Do not attempt resuscitation (DNR) -DNR-LIMITED -Do Not Intubate/DNI  [id number]  Vassie Loll, MD ED   02/17/2023 2241 03/01/2023 1721 Limited: Do not attempt resuscitation (DNR) -DNR-LIMITED -Do Not Intubate/DNI  [id number]  Nolberto Hanlon, MD ED   02/08/2023 1720 02/13/2023 1806 Limited: Do not attempt resuscitation (DNR) -DNR-LIMITED -Do Not Intubate/DNI  [id number]  Anice Paganini, NP Inpatient   02/06/2023 1929 02/08/2023 1720 Full Code [id number]  Anselm Jungling, DO ED   01/29/2023 1812 02/02/2023 1527 Full  Code [id number]  Onnie Boer, MD Inpatient   01/12/2023 1448 01/15/2023 1719 Full Code [id number]  Burnadette Pop, MD ED   12/27/2022 2031 12/31/2022 1703 Full Code [id number]  Dorcas Carrow, MD ED   05/06/2022 0331 05/09/2022 1959 Full Code [id number]  Zierle-Ghosh, Asia B, DO ED   02/15/2022 1126 02/19/2022 1614 Full Code [id number]   Fritzi Mandes, MD Inpatient   08/23/2021 2013 08/31/2021 2141 Full Code [id number]  Angie Fava, DO Inpatient   07/14/2021 0656 07/21/2021 2329 Full Code [id number]  Frankey Shown, DO Inpatient   07/13/2021 2320 07/14/2021 0656 DNR [id number]  Carren Rang, Asia B, DO ED   12/16/2015 1607 12/19/2015 2050 Full Code [id number]  Maeola Harman, MD Inpatient         IV Access:   Peripheral IV   Procedures and diagnostic studies:   CT ABDOMEN PELVIS W CONTRAST Result Date: 04/10/2023 CLINICAL DATA:  Pancreatic cancer. Portal vein thrombosis. Thrombosed tips shunt identified on comparison CT. EXAM: CT ABDOMEN AND PELVIS WITH CONTRAST TECHNIQUE: Multidetector CT imaging of the abdomen and pelvis was performed using the standard protocol following bolus administration of intravenous contrast. RADIATION DOSE REDUCTION: This exam was performed according to the departmental dose-optimization program which includes automated exposure control, adjustment of the mA and/or kV according to patient size and/or use of iterative reconstruction technique. CONTRAST:  OMNIPAQUE IOHEXOL 300 MG/ML  SOLN COMPARISON:  CT a 02/25/2023 FINDINGS: Lower chest: Bilateral pleural effusions with associated bibasilar atelectasis. No pulmonary edema. Pneumonia. Hepatobiliary: Again demonstrated the tip shunt is thrombosed. Thrombus extends into the IVC above the tip shunt below the atrium (image 5/series 5.) the portal veins are thrombosed. There is a moderate volume of free fluid along the margin of the liver similar comparison exam. Small amount of fluid along the LEFT and RIGHT pericolic gutter which collects the pelvis volume ascites similar to prior CT normal volume spleen Pancreas: Pancreas demonstrates chronic dilatation of the duct and atrophy. There is a mass the pancreas unchanged measuring 18 mm image 29/5. Spleen: Normal volume spleen Adrenals/urinary tract: Adrenal glands and kidneys are normal. The ureters and  bladder normal. Stomach/Bowel: Stomach, small bowel, appendix, and cecum are normal. The colon and rectosigmoid colon are normal. Vascular/Lymphatic: Aorta is normal. Thrombosis of the portal vein as above. Reproductive: Uterus unremarkable Other: Moderate volume of free fluid/ascites similar to comparison CT Musculoskeletal: No aggressive osseous lesion. IMPRESSION: 1. No significant change from CT 02/25/2023. 2. 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 3. Moderate to large volume ascites not changed in volume. 4. Pancreatic head mass unchanged. 5. No evidence of bowel obstruction. 6. Bilateral pleural effusions bibasilar atelectasis. No evidence of pneumonia. Electronically Signed   By: Genevive Bi M.D.   On: 04/10/2023 19:11   DG Chest Portable 1 View Result Date: 04/10/2023 CLINICAL DATA:  Short of breath EXAM: PORTABLE CHEST 1 VIEW COMPARISON:  None Available. FINDINGS: Port in the anterior chest wall with tip in distal SVC. Density of the RIGHT cardiophrenic angle. LEFT basilar atelectasis. Upper lungs are clear. No pneumothorax. IMPRESSION: Medial RIGHT lower alobe density representing pneumonia, atelectasis or aspiration pneumonitis. Electronically Signed   By: Genevive Bi M.D.   On: 04/10/2023 15:38     Medical Consultants:   None.   Subjective:    Curstin Schmale now she is notable escalating her breathing is better  Objective:    Vitals:   04/11/23 2358 04/12/23 0455 04/12/23 0500 04/12/23 0850  BP:  102/61 102/61  112/64  Pulse: 93 93  95  Resp: 17   20  Temp: 98.1 F (36.7 C)  98.1 F (36.7 C) 97.9 F (36.6 C)  TempSrc: Oral   Oral  SpO2: 90% 90%  91%  Weight:      Height:       SpO2: 91 % O2 Flow Rate (L/min): 4 L/min   Intake/Output Summary (Last 24 hours) at 04/12/2023 0950 Last data filed at 04/12/2023 0755 Gross per 24 hour  Intake 885.78 ml  Output --  Net 885.78 ml   Filed Weights   04/10/23 1414 04/10/23 1817   Weight: 59 kg (P) 58.5 kg    Exam: General exam: In no acute distress. Respiratory system: Good air movement and clear to auscultation. Cardiovascular system: S1 & S2 heard, RRR. No JVD. Gastrointestinal system: Abdomen is nondistended, soft and nontender.  Extremities: No pedal edema. Skin: No rashes, lesions or ulcers Psychiatry: Judgement and insight appear normal. Mood & affect appropriate.  Data Reviewed:    Labs: Basic Metabolic Panel: Recent Labs  Lab 04/10/23 1522 04/11/23 0456  NA 135 137  K 3.4* 3.5  CL 107 109  CO2 19* 23  GLUCOSE 158* 108*  BUN 16 13  CREATININE 0.75 0.68  CALCIUM 8.5* 8.2*   GFR Estimated Creatinine Clearance: 45.1 mL/min (by C-G formula based on SCr of 0.68 mg/dL). Liver Function Tests: Recent Labs  Lab 04/10/23 1522  AST 500*  ALT 542*  ALKPHOS 605*  BILITOT 4.3*  PROT 5.4*  ALBUMIN 2.9*   Recent Labs  Lab 04/10/23 1522  LIPASE 15   No results for input(s): "AMMONIA" in the last 168 hours. Coagulation profile Recent Labs  Lab 04/10/23 1706 04/10/23 2251  INR 1.1 1.2   COVID-19 Labs  No results for input(s): "DDIMER", "FERRITIN", "LDH", "CRP" in the last 72 hours.  Lab Results  Component Value Date   SARSCOV2NAA NEGATIVE 04/10/2023    CBC: Recent Labs  Lab 04/06/23 1439 04/10/23 1522 04/11/23 0456 04/12/23 0200  WBC 4.2 6.4 4.6 4.4  HGB 8.9* 8.1* 7.0* 7.5*  HCT 27.9* 25.6* 22.2* 23.8*  MCV 98.2 101.6* 102.8* 101.7*  PLT 121* 97* 75* 68*   Cardiac Enzymes: No results for input(s): "CKTOTAL", "CKMB", "CKMBINDEX", "TROPONINI" in the last 168 hours. BNP (last 3 results) No results for input(s): "PROBNP" in the last 8760 hours. CBG: No results for input(s): "GLUCAP" in the last 168 hours. D-Dimer: No results for input(s): "DDIMER" in the last 72 hours. Hgb A1c: No results for input(s): "HGBA1C" in the last 72 hours. Lipid Profile: No results for input(s): "CHOL", "HDL", "LDLCALC", "TRIG", "CHOLHDL",  "LDLDIRECT" in the last 72 hours. Thyroid function studies: No results for input(s): "TSH", "T4TOTAL", "T3FREE", "THYROIDAB" in the last 72 hours.  Invalid input(s): "FREET3" Anemia work up: No results for input(s): "VITAMINB12", "FOLATE", "FERRITIN", "TIBC", "IRON", "RETICCTPCT" in the last 72 hours. Sepsis Labs: Recent Labs  Lab 04/06/23 1439 04/10/23 1522 04/10/23 1706 04/10/23 2251 04/11/23 0456 04/12/23 0200  WBC 4.2 6.4  --   --  4.6 4.4  LATICACIDVEN  --  1.9 1.5 1.1  --   --    Microbiology Recent Results (from the past 240 hours)  Resp panel by RT-PCR (RSV, Flu A&B, Covid) Anterior Nasal Swab     Status: None   Collection Time: 04/10/23  2:20 PM   Specimen: Anterior Nasal Swab  Result Value Ref Range Status   SARS Coronavirus 2 by RT PCR  NEGATIVE NEGATIVE Final    Comment: (NOTE) SARS-CoV-2 target nucleic acids are NOT DETECTED.  The SARS-CoV-2 RNA is generally detectable in upper respiratory specimens during the acute phase of infection. The lowest concentration of SARS-CoV-2 viral copies this assay can detect is 138 copies/mL. A negative result does not preclude SARS-Cov-2 infection and should not be used as the sole basis for treatment or other patient management decisions. A negative result may occur with  improper specimen collection/handling, submission of specimen other than nasopharyngeal swab, presence of viral mutation(s) within the areas targeted by this assay, and inadequate number of viral copies(<138 copies/mL). A negative result must be combined with clinical observations, patient history, and epidemiological information. The expected result is Negative.  Fact Sheet for Patients:  BloggerCourse.com  Fact Sheet for Healthcare Providers:  SeriousBroker.it  This test is no t yet approved or cleared by the [place] FDA and  has been authorized for detection and/or diagnosis of SARS-CoV-2 by FDA  under an Emergency Use Authorization (EUA). This EUA will remain  in effect (meaning this test can be used) for the duration of the COVID-19 declaration under Section 564(b)(1) of the Act, 21 U.S.C.section 360bbb-3(b)(1), unless the authorization is terminated  or revoked sooner.       Influenza A by PCR NEGATIVE NEGATIVE Final   Influenza B by PCR NEGATIVE NEGATIVE Final    Comment: (NOTE) The Xpert Xpress SARS-CoV-2/FLU/RSV plus assay is intended as an aid in the diagnosis of influenza from Nasopharyngeal swab specimens and should not be used as a sole basis for treatment. Nasal washings and aspirates are unacceptable for Xpert Xpress SARS-CoV-2/FLU/RSV testing.  Fact Sheet for Patients: BloggerCourse.com  Fact Sheet for Healthcare Providers: SeriousBroker.it  This test is not yet approved or cleared by the [place] FDA and has been authorized for detection and/or diagnosis of SARS-CoV-2 by FDA under an Emergency Use Authorization (EUA). This EUA will remain in effect (meaning this test can be used) for the duration of the COVID-19 declaration under Section 564(b)(1) of the Act, 21 U.S.C. section 360bbb-3(b)(1), unless the authorization is terminated or revoked.     Resp Syncytial Virus by PCR NEGATIVE NEGATIVE Final    Comment: (NOTE) Fact Sheet for Patients: BloggerCourse.com  Fact Sheet for Healthcare Providers: SeriousBroker.it  This test is not yet approved or cleared by the [place] FDA and has been authorized for detection and/or diagnosis of SARS-CoV-2 by FDA under an Emergency Use Authorization (EUA). This EUA will remain in effect (meaning this test can be used) for the duration of the COVID-19 declaration under Section 564(b)(1) of the Act, 21 U.S.C. section 360bbb-3(b)(1), unless the authorization is terminated or revoked.  Performed at [facility], [address] [place], [place] [address]   Blood culture (routine x 2)     Status: None (Preliminary result)   Collection Time: 04/10/23  3:22 PM   Specimen: BLOOD  Result Value Ref Range Status   Specimen Description BLOOD LEFT ANTECUBITAL  Final   Special Requests   Final    BOTTLES DRAWN AEROBIC AND ANAEROBIC Blood Culture adequate volume   Culture   Final    NO GROWTH 2 DAYS Performed at [facility], [address], [place], [place] [address]    Report Status PENDING  Incomplete  Blood culture (routine x 2)     Status: None (Preliminary result)   Collection Time: 04/10/23  3:22 PM   Specimen: BLOOD  Result Value Ref Range Status   Specimen  Description BLOOD RIGHT ANTECUBITAL  Final   Special Requests   Final    BOTTLES DRAWN AEROBIC AND ANAEROBIC Blood Culture adequate volume   Culture   Final    NO GROWTH 2 DAYS Performed at [facility], [address] [place], [place] [address]    Report Status PENDING  Incomplete  Culture, blood (x 2)     Status: None (Preliminary result)   Collection Time: 04/10/23 10:51 PM   Specimen: BLOOD LEFT ARM  Result Value Ref Range Status   Specimen Description BLOOD LEFT ARM  Final   Special Requests   Final    BOTTLES DRAWN AEROBIC AND ANAEROBIC Blood Culture adequate volume   Culture   Final    NO GROWTH 2 DAYS Performed at [facility], [address], [place], [place] [address]    Report Status PENDING  Incomplete     Medications:    sodium chloride   Intravenous Once   buPROPion  300 mg Oral QPC breakfast   busPIRone  20 mg Oral BID   Chlorhexidine Gluconate Cloth  6 each Topical Daily   cholecalciferol  10,000 Units Oral q morning   escitalopram  20 mg Oral QHS   guaiFENesin  600 mg Oral BID   lactose free nutrition  237 mL Oral BID BM   linaclotide  72 mcg Oral QODAY   mirtazapine  15 mg Oral QHS   pantoprazole (PROTONIX) IV  40 mg Intravenous Q12H   polyethylene glycol  17 g Oral BID   QUEtiapine  25 mg Oral BID    sodium chloride flush  10-40 mL Intracatheter Q12H   sucralfate  1 g Oral TID WC & HS   Continuous Infusions:  azithromycin 500 mg (04/11/23 1742)   cefTRIAXone (ROCEPHIN)  IV 2 g (04/11/23 1703)      LOS: 2 days   Marinda Elk  [facility]  04/12/2023, 9:50 AM

## 2023-04-12 NOTE — Progress Notes (Signed)
 Pt has been made full comfort care per policy, no medication given at this time.  Pt is still alert and oriented at this time. She had the nurse tech call the pastor and her son Fayrene Fearing is on the way.

## 2023-04-12 NOTE — Plan of Care (Signed)
 Pt is full comfort at this time.  Problem: Fluid Volume: Goal: Hemodynamic stability will improve Outcome: Not Applicable   Problem: Clinical Measurements: Goal: Diagnostic test results will improve Outcome: Not Applicable Goal: Signs and symptoms of infection will decrease Outcome: Not Applicable   Problem: Respiratory: Goal: Ability to maintain adequate ventilation will improve Outcome: Not Applicable   Problem: Education: Goal: Knowledge of General Education information will improve Description: Including pain rating scale, medication(s)/side effects and non-pharmacologic comfort measures Outcome: Not Applicable   Problem: Clinical Measurements: Goal: Ability to maintain clinical measurements within normal limits will improve Outcome: Not Applicable Goal: Will remain free from infection Outcome: Not Applicable Goal: Diagnostic test results will improve Outcome: Not Applicable Goal: Respiratory complications will improve Outcome: Not Applicable Goal: Cardiovascular complication will be avoided Outcome: Not Applicable   Problem: Health Behavior/Discharge Planning: Goal: Ability to manage health-related needs will improve Outcome: Not Applicable   Problem: Activity: Goal: Risk for activity intolerance will decrease Outcome: Not Applicable   Problem: Nutrition: Goal: Adequate nutrition will be maintained Outcome: Not Applicable   Problem: Coping: Goal: Level of anxiety will decrease Outcome: Not Applicable   Problem: Elimination: Goal: Will not experience complications related to bowel motility Outcome: Not Applicable Goal: Will not experience complications related to urinary retention Outcome: Not Applicable   Problem: Pain Managment: Goal: General experience of comfort will improve and/or be controlled Outcome: Not Applicable   Problem: Safety: Goal: Ability to remain free from injury will improve Outcome: Not Applicable   Problem: Education: Goal:  Knowledge of the prescribed therapeutic regimen will improve Outcome: Not Applicable   Problem: Skin Integrity: Goal: Risk for impaired skin integrity will decrease Outcome: Not Applicable   Problem: Clinical Measurements: Goal: Quality of life will improve Outcome: Not Applicable   Problem: Respiratory: Goal: Verbalizations of increased ease of respirations will increase Outcome: Not Applicable   Problem: Role Relationship: Goal: Family's ability to cope with current situation will improve Outcome: Not Applicable Goal: Ability to verbalize concerns, feelings, and thoughts to partner or family member will improve Outcome: Not Applicable   Problem: Pain Management: Goal: Satisfaction with pain management regimen will improve Outcome: Not Applicable

## 2023-04-12 NOTE — Plan of Care (Signed)
   Problem: Fluid Volume: Goal: Hemodynamic stability will improve Outcome: Progressing   Problem: Clinical Measurements: Goal: Diagnostic test results will improve Outcome: Progressing Goal: Signs and symptoms of infection will decrease Outcome: Progressing   Problem: Respiratory: Goal: Ability to maintain adequate ventilation will improve Outcome: Progressing

## 2023-04-12 NOTE — Progress Notes (Signed)
 Made several attempts to end the end time for blood transfusion. The blood was finished ar 23:45 Vitals for the end time was done at this time, however Epic stated that End time wasn't complete. And that End time was 23:56 the vitals were then moved to that time and I thought it was complete but Epic stated not complete. Attempted to end the time in am 3/18 but the wrong volume and time showed up. I did have several RN's to assist and could not figure out what needed to be chart. The vitals were done and patient had no reaction.

## 2023-04-13 DIAGNOSIS — R7401 Elevation of levels of liver transaminase levels: Secondary | ICD-10-CM | POA: Diagnosis not present

## 2023-04-13 DIAGNOSIS — K831 Obstruction of bile duct: Secondary | ICD-10-CM | POA: Diagnosis not present

## 2023-04-13 DIAGNOSIS — J9601 Acute respiratory failure with hypoxia: Secondary | ICD-10-CM | POA: Diagnosis not present

## 2023-04-13 DIAGNOSIS — K922 Gastrointestinal hemorrhage, unspecified: Secondary | ICD-10-CM | POA: Diagnosis not present

## 2023-04-13 NOTE — Progress Notes (Signed)
 [facility] PROGRESS NOTE    Progress Note  Sheryl Bender  GMW:[id number] DOB: May 26, 1945 DOA: 04/10/2023 PCP: [name], DO     Brief Narrative:   Sheryl Bender is an 78 y.o. female has medical history significant for pancreatic cancer not on chemotherapy followed by [name], osteoarthritis and PE not on anticoagulation recently admitted last month for GI bleed which required packed red blood cell transfusion presents to the emergency room for weakness associated with abdominal pain.  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  Assessment/Plan:   Comfort measures: Continue IV morphine drip. Use Ativan as needed. The patient will probably pass away in house.  Acute respiratory failure with hypoxia likely multifactorial aspiration pneumonia plus minus acute PE: On 04/12/2023 she became acutely short of breath tachycardic and struggling to breathe. She was placed on nonrebreather and her saturations were in the 30s and 40s.  She was breathing about 30-40 times per minute. Spoke to the son that she probably had a new PE and she was not a candidate for anticoagulation due to history of GI bleed.  CT angio could not be performed she was open stable. I talked the patient and the son and they both agreed to move towards comfort measures. All medications not related to comfort were discontinued. Continues to be on IV morphine drip dyspnea and suffocation Significantly improved.  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 GI saw the patient and again recommended transfer to [facility]. GI was consulted recommended no further interventions. They recommended  hospice to get involved.  She is being followed by palliative care as an outpatient.  Normocytic anemia: Status post one 1 unit of packed blood cells her hemoglobin this morning 7.5.  Elevated troponins: Anxiety and depression: Thrombocytopenia:  DVT prophylaxis: scd Family Communication:none Status is: Inpatient Remains inpatient appropriate because: Shortness of breath    Code Status:     Code Status Orders  (From admission, onward)           Start     Ordered   04/10/23 2102  Do not attempt resuscitation (DNR)- Limited -Do Not Intubate (DNI)  Continuous       Question Answer Comment  If pulseless and not breathing No CPR or chest compressions.   In Pre-Arrest Conditions (Patient Is Breathing and Has A Pulse) Do not intubate. Provide all appropriate non-invasive medical interventions. Avoid ICU transfer unless indicated or required.   Consent: Discussion documented in EHR or advanced directives reviewed      04/10/23 2102           Code Status History     Date Active Date Inactive Code Status Order ID Comments User Context   03/12/2023 1435 03/13/2023 2221 Limited: Do not attempt resuscitation (DNR) -DNR-LIMITED -Do Not Intubate/DNI  [id number]  Vassie Loll, MD ED   02/17/2023 2241 03/01/2023 1721 Limited: Do not attempt resuscitation (DNR) -DNR-LIMITED -Do Not Intubate/DNI  [id number]  Nolberto Hanlon, MD ED   02/08/2023 1720 02/13/2023 1806 Limited: Do not attempt resuscitation (DNR) -DNR-LIMITED -Do Not Intubate/DNI  [id number]  Anice Paganini, NP Inpatient   02/06/2023 1929 02/08/2023 1720 Full Code [id number]  Anselm Jungling, DO ED   01/29/2023 1812 02/02/2023 1527 Full  Code [id number]  Onnie Boer, MD Inpatient   01/12/2023 1448 01/15/2023 1719 Full Code [id number]  Burnadette Pop, MD ED   12/27/2022 2031 12/31/2022 1703 Full Code [id number]  Dorcas Carrow, MD ED   05/06/2022 0331 05/09/2022 1959 Full Code [id number]  Zierle-Ghosh, Asia B, DO ED   02/15/2022 1126 02/19/2022 1614  Full Code [id number]  Fritzi Mandes, MD Inpatient   08/23/2021 2013 08/31/2021 2141 Full Code [id number]  Angie Fava, DO Inpatient   07/14/2021 0656 07/21/2021 2329 Full Code [id number]  Frankey Shown, DO Inpatient   07/13/2021 2320 07/14/2021 0656 DNR [id number]  Carren Rang, Asia B, DO ED   12/16/2015 1607 12/19/2015 2050 Full Code [id number]  Maeola Harman, MD Inpatient         IV Access:   Peripheral IV   Procedures and diagnostic studies:   No results found.    Medical Consultants:   None.   Subjective:    Sheryl Bender sleepy  Objective:    Vitals:   04/12/23 0455 04/12/23 0500 04/12/23 0850 04/13/23 0605  BP: 102/61  112/64 111/65  Pulse: 93  95 (!) 104  Resp:   20 19  Temp:  98.1 F (36.7 C) 97.9 F (36.6 C) (!) 97.4 F (36.3 C)  TempSrc:   Oral   SpO2: 90%  91% (!) 72%  Weight:      Height:       SpO2: (!) 72 % O2 Flow Rate (L/min): 4 L/min   Intake/Output Summary (Last 24 hours) at 04/13/2023 1017 Last data filed at 04/12/2023 1550 Gross per 24 hour  Intake 5.68 ml  Output --  Net 5.68 ml   Filed Weights   04/10/23 1414 04/10/23 1817  Weight: 59 kg (P) 58.5 kg    Exam: General exam: In no acute distress. Respiratory system: Good air movement and clear to auscultation. Extremities: No pedal edema. Skin: No rashes, lesions or ulcers  Data Reviewed:    Labs: Basic Metabolic Panel: Recent Labs  Lab 04/10/23 1522 04/11/23 0456  NA 135 137  K 3.4* 3.5  CL 107 109  CO2 19* 23  GLUCOSE 158* 108*  BUN 16 13  CREATININE 0.75 0.68  CALCIUM 8.5* 8.2*   GFR Estimated Creatinine Clearance: 45.1 mL/min (by C-G formula based on SCr of 0.68 mg/dL). Liver Function Tests: Recent Labs  Lab 04/10/23 1522  AST 500*  ALT 542*  ALKPHOS 605*  BILITOT 4.3*  PROT 5.4*  ALBUMIN 2.9*   Recent Labs  Lab 04/10/23 1522  LIPASE 15   No results for input(s): "AMMONIA" in the last 168 hours. Coagulation profile Recent Labs  Lab  04/10/23 1706 04/10/23 2251  INR 1.1 1.2   COVID-19 Labs  No results for input(s): "DDIMER", "FERRITIN", "LDH", "CRP" in the last 72 hours.  Lab Results  Component Value Date   SARSCOV2NAA NEGATIVE 04/10/2023    CBC: Recent Labs  Lab 04/06/23 1439 04/10/23 1522 04/11/23 0456 04/12/23 0200  WBC 4.2 6.4 4.6 4.4  HGB 8.9* 8.1* 7.0* 7.5*  HCT 27.9* 25.6* 22.2* 23.8*  MCV 98.2 101.6* 102.8* 101.7*  PLT 121* 97* 75* 68*   Cardiac Enzymes: No results for input(s): "CKTOTAL", "CKMB", "CKMBINDEX", "TROPONINI" in the last 168 hours. BNP (last 3 results) No results for input(s): "PROBNP" in the last 8760 hours. CBG: No results for input(s): "GLUCAP" in the last 168 hours. D-Dimer: No results for input(s): "DDIMER" in the last 72 hours. Hgb A1c: No results for input(s): "HGBA1C"  in the last 72 hours. Lipid Profile: No results for input(s): "CHOL", "HDL", "LDLCALC", "TRIG", "CHOLHDL", "LDLDIRECT" in the last 72 hours. Thyroid function studies: No results for input(s): "TSH", "T4TOTAL", "T3FREE", "THYROIDAB" in the last 72 hours.  Invalid input(s): "FREET3" Anemia work up: No results for input(s): "VITAMINB12", "FOLATE", "FERRITIN", "TIBC", "IRON", "RETICCTPCT" in the last 72 hours. Sepsis Labs: Recent Labs  Lab 04/06/23 1439 04/10/23 1522 04/10/23 1706 04/10/23 2251 04/11/23 0456 04/12/23 0200  WBC 4.2 6.4  --   --  4.6 4.4  LATICACIDVEN  --  1.9 1.5 1.1  --   --    Microbiology Recent Results (from the past 240 hours)  Resp panel by RT-PCR (RSV, Flu A&B, Covid) Anterior Nasal Swab     Status: None   Collection Time: 04/10/23  2:20 PM   Specimen: Anterior Nasal Swab  Result Value Ref Range Status   SARS Coronavirus 2 by RT PCR NEGATIVE NEGATIVE Final    Comment: (NOTE) SARS-CoV-2 target nucleic acids are NOT DETECTED.  The SARS-CoV-2 RNA is generally detectable in upper respiratory specimens during the acute phase of infection. The lowest concentration of  SARS-CoV-2 viral copies this assay can detect is 138 copies/mL. A negative result does not preclude SARS-Cov-2 infection and should not be used as the sole basis for treatment or other patient management decisions. A negative result may occur with  improper specimen collection/handling, submission of specimen other than nasopharyngeal swab, presence of viral mutation(s) within the areas targeted by this assay, and inadequate number of viral copies(<138 copies/mL). A negative result must be combined with clinical observations, patient history, and epidemiological information. The expected result is Negative.  Fact Sheet for Patients:  BloggerCourse.com  Fact Sheet for Healthcare Providers:  SeriousBroker.it  This test is no t yet approved or cleared by the [place] FDA and  has been authorized for detection and/or diagnosis of SARS-CoV-2 by FDA under an Emergency Use Authorization (EUA). This EUA will remain  in effect (meaning this test can be used) for the duration of the COVID-19 declaration under Section 564(b)(1) of the Act, 21 U.S.C.section 360bbb-3(b)(1), unless the authorization is terminated  or revoked sooner.       Influenza A by PCR NEGATIVE NEGATIVE Final   Influenza B by PCR NEGATIVE NEGATIVE Final    Comment: (NOTE) The Xpert Xpress SARS-CoV-2/FLU/RSV plus assay is intended as an aid in the diagnosis of influenza from Nasopharyngeal swab specimens and should not be used as a sole basis for treatment. Nasal washings and aspirates are unacceptable for Xpert Xpress SARS-CoV-2/FLU/RSV testing.  Fact Sheet for Patients: BloggerCourse.com  Fact Sheet for Healthcare Providers: SeriousBroker.it  This test is not yet approved or cleared by the [place] FDA and has been authorized for detection and/or diagnosis of SARS-CoV-2 by FDA under an Emergency Use  Authorization (EUA). This EUA will remain in effect (meaning this test can be used) for the duration of the COVID-19 declaration under Section 564(b)(1) of the Act, 21 U.S.C. section 360bbb-3(b)(1), unless the authorization is terminated or revoked.     Resp Syncytial Virus by PCR NEGATIVE NEGATIVE Final    Comment: (NOTE) Fact Sheet for Patients: BloggerCourse.com  Fact Sheet for Healthcare Providers: SeriousBroker.it  This test is not yet approved or cleared by the [place] FDA and has been authorized for detection and/or diagnosis of SARS-CoV-2 by FDA under an Emergency Use Authorization (EUA). This EUA will remain in effect (meaning this test can be used) for the duration of  the COVID-19 declaration under Section 564(b)(1) of the Act, 21 U.S.C. section 360bbb-3(b)(1), unless the authorization is terminated or revoked.  Performed at [facility], [address] [place], [place] [address]   Blood culture (routine x 2)     Status: None (Preliminary result)   Collection Time: 04/10/23  3:22 PM   Specimen: BLOOD  Result Value Ref Range Status   Specimen Description BLOOD LEFT ANTECUBITAL  Final   Special Requests   Final    BOTTLES DRAWN AEROBIC AND ANAEROBIC Blood Culture adequate volume   Culture   Final    NO GROWTH 3 DAYS Performed at [facility], [address] [place], [place] [address]    Report Status PENDING  Incomplete  Blood culture (routine x 2)     Status: None (Preliminary result)   Collection Time: 04/10/23  3:22 PM   Specimen: BLOOD  Result Value Ref Range Status   Specimen Description BLOOD RIGHT ANTECUBITAL  Final   Special Requests   Final    BOTTLES DRAWN AEROBIC AND ANAEROBIC Blood Culture adequate volume   Culture   Final    NO GROWTH 3 DAYS Performed at [facility], [address], [place], [place] [address]    Report Status PENDING  Incomplete  Culture, blood (x 2)     Status: None  (Preliminary result)   Collection Time: 04/10/23 10:51 PM   Specimen: BLOOD LEFT ARM  Result Value Ref Range Status   Specimen Description BLOOD LEFT ARM  Final   Special Requests   Final    BOTTLES DRAWN AEROBIC AND ANAEROBIC Blood Culture adequate volume   Culture   Final    NO GROWTH 3 DAYS Performed at [facility], [address] [place], [place] [address]    Report Status PENDING  Incomplete     Medications:    sodium chloride   Intravenous Once    morphine injection  4 mg Intravenous NOW   Continuous Infusions:  morphine 1 mg/hr (04/12/23 1009)      LOS: 3 days   Marinda Elk  [facility]  04/13/2023, 10:17 AM

## 2023-04-13 NOTE — Progress Notes (Addendum)
   04/13/23 1239  TOC Assessment  TOC screening is complete Yes  Once discharged, how will the patient get to their discharge location? Ambulance  Expected Discharge Plan Hospice Medical Facility  Final next level of care Hospice Medical Facility  Barriers to Discharge Continued Medical Work up  [employer].gov Compare Post Acute Care list provided to: Patient Represenative (must comment) (son Lavonia Dana)  Choice offered to / list presented to  Adult Children  Post Acute Care Choice Residential Hospice Bed  Living arrangements for the past 2 months Apartment  Lives with: Self  Discharge Planning Services CM Consult  DME Arranged N/A  HH Arranged NA  HH Agency NA  Do you feel safe going back to the place where you live?  (see note)  Permission granted to share information with   (patient not responsive)  Patient language and need for interpreter reviewed: Yes  Criminal Activity/Legal Involvement Pertinent to Current Situation/Hospitalization No - Comment as needed  Need for Family Participation in Patient Care Y  Care giver support system in place? N  Alcohol / Substance Use Not Applicable  Psych Involvement N   Spoke to patient's son [name] and daughter in Water engineer at bedside.   Patient from [facility]. Plan is for residential hospice at discharge.   Patient was active with [facility] for  palliative care services prior to admission.   There are hospice homes in [place] and [place].   [name] prefers [place], [facility].   NCM called and spoke to [facility] , she will review information and have a nurse come to patient's hospital bedside to do an assessment tomorrow morning betweem 10 am and 10:30 am. [name] and [place] aware. Provide Ignacia Bayley with their contact information [name] [phone] work [phone] and Ryan   Kamrynn with  [facility] called NCM back  , there  are no residential hospices in [place] or [place]. Their Residential Hospice faculties are in [place] and [place], both 90 miles from patient's address.   Closest is [facility] in [place] ( 38 minute drive from patient's address).

## 2023-04-13 NOTE — Care Management Important Message (Signed)
 Important Message  Patient Details  Name: Sheryl Bender MRN: [id number] Date of Birth: [birth year]   Important Message Given:  Yes - Medicare IM     Sherilyn Banker 04/13/2023, 1:01 PM

## 2023-04-14 DIAGNOSIS — K831 Obstruction of bile duct: Secondary | ICD-10-CM | POA: Diagnosis not present

## 2023-04-14 LAB — TYPE AND SCREEN
ABO/RH(D): A POS
Antibody Screen: NEGATIVE
Unit division: 0

## 2023-04-14 LAB — BPAM RBC
Blood Product Expiration Date: 202504062359
Unit Type and Rh: 6200

## 2023-04-14 MED ORDER — GLYCOPYRROLATE 0.2 MG/ML IJ SOLN
0.1000 mg | INTRAMUSCULAR | Status: DC
  Administered 2023-04-14 – 2023-04-18 (×23): 0.1 mg via INTRAVENOUS
  Filled 2023-04-14 (×23): qty 1

## 2023-04-14 NOTE — TOC Progression Note (Signed)
 Transition of Care (TOC) - Progression Note   Patient has been approved for [facility] residential hospice. They do not have a bed today.   Patient's saturations are in 70's . Changed to Anticipate in-hospital death    Patient Details  Name: Sheryl Bender MRN: [id number] Date of Birth: 12/28/1945  Transition of Care [facility]) CM/SW Contact  Elham Fini, Adria Devon, RN Phone Number: 04/14/2023, 1:24 PM  Clinical Narrative:       Expected Discharge Plan: Hospice Medical Facility Barriers to Discharge: Continued Medical Work up  Expected Discharge Plan and Services   Discharge Planning Services: CM Consult Post Acute Care Choice: Residential Hospice Bed Living arrangements for the past 2 months: Apartment                 DME Arranged: N/A         HH Arranged: NA HH Agency: NA         Social Determinants of Health (SDOH) Interventions SDOH Screenings   Food Insecurity: No Food Insecurity (04/11/2023)  Housing: Low Risk  (04/11/2023)  Transportation Needs: No Transportation Needs (04/11/2023)  Utilities: Not At Risk (04/11/2023)  Social Connections: Moderately Integrated (04/11/2023)  Tobacco Use: Low Risk  (04/10/2023)    Readmission Risk Interventions    02/28/2023   10:34 AM 02/21/2023   12:31 PM 01/31/2023   12:36 PM  Readmission Risk Prevention Plan  Transportation Screening Complete Complete Complete  HRI or Home Care Consult   Complete  Social Work Consult for Recovery Care Planning/Counseling   Complete  Palliative Care Screening   Not Applicable  Medication Review Oceanographer) Complete Complete Complete  PCP or Specialist appointment within 3-5 days of discharge Complete Complete   HRI or Home Care Consult Complete Complete   SW Recovery Care/Counseling Consult Complete Complete   Palliative Care Screening Complete Complete   Skilled Nursing Facility Not Applicable Patient Refused

## 2023-04-14 NOTE — Progress Notes (Signed)
 PROGRESS NOTE  Sheryl Bender [id number] DOB: Jun 12, 1945 DOA: 04/10/2023 PCP: [name], DO   LOS: 4 days   Brief Narrative / Interim history: 78 year old female with pancreatic cancer not on chemotherapy, osteoarthritis and PE not on anticoagulation, recent admission last month for GI bleed comes into the hospital for weakness, abdominal pain.  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  It also showed volume overload with bilateral pleural effusions.  After discussion with family, she was transition to comfort  Subjective / 24h Interval events: Comfortable, poorly responsive  Assesement and Plan: Principal Problem:   Obstructive jaundice Active Problems:   Sepsis due to pneumonia (HCC)   Symptomatic anemia   Anxiety and depression   Elevated troponin   Elevated transaminase level   Principal problem End-of-life care, comfort measures -continue morphine infusion.  Anticipate in-hospital death  Active problems Acute hypoxic respiratory failure Likely multifocal aspiration pneumonia Probable PE Obstructive jaundice Portal vein thrombosis TIPS thrombosis Pancreatic cancer Anemia, normocytic Thrombocytopenia  Scheduled Meds:  sodium chloride   Intravenous Once   Continuous Infusions:  morphine 2 mg/hr (04/13/23 1236)   PRN Meds:.haloperidol **OR** haloperidol **OR** haloperidol lactate, LORazepam **OR** LORazepam **OR** LORazepam, morphine injection, ondansetron **OR** ondansetron (ZOFRAN) IV, simethicone, traZODone  Current Outpatient Medications  Medication Instructions   acetaminophen (TYLENOL) 1,000 mg, Oral, Every 8 hours PRN   buPROPion (WELLBUTRIN XL) 300 mg, Oral, Daily after breakfast   busPIRone (BUSPAR) 20 mg, Oral, 2  times daily   escitalopram (LEXAPRO) 20 mg, Oral, Daily at bedtime   ferrous sulfate 325 mg, Daily   lactose free nutrition (BOOST) LIQD 237 mLs, Oral, 2 times daily between meals   lidocaine-prilocaine (EMLA) cream Apply a small amount to port a cath site (do not rub in) and cover with plastic wrap 1 hour prior to infusion appointments   linaclotide (LINZESS) 72 mcg, Oral, Every other day, In the morning; hold it for diarrhea.   LORazepam (ATIVAN) 0.5 mg, Oral, Every 8 hours PRN   mirtazapine (REMERON) 15 mg, Oral, Daily at bedtime   ondansetron (ZOFRAN) 4 mg, Oral, Every 8 hours PRN   pantoprazole (PROTONIX) 40 mg, Oral, 2 times daily   QUEtiapine (SEROQUEL) 25 mg, Oral, 2 times daily   sucralfate (CARAFATE) 1 g, Oral, 3 times daily with meals & bedtime   Vitamin D3 5,000 Units, Oral, Every morning    Diet Orders (From admission, onward)     Start     Ordered   04/11/23 1345  Diet regular Fluid consistency: Thin  Diet effective now       Question:  Fluid consistency:  Answer:  Thin   04/11/23 1344            DVT prophylaxis:    Lab Results  Component Value Date   PLT 68 (L) 04/12/2023      Code Status: Do not attempt resuscitation (DNR) - Comfort care  Family Communication: No family at bedside  Status is: Inpatient  Level of care: Telemetry Medical  Consultants:  none  Objective: Vitals:   04/13/23 0605 04/14/23 0413 04/14/23 0738 04/14/23 0852  BP: 111/65 106/62 (!) 103/56   Pulse: (!) 104 (!) 102 98   Resp: 19 16 16    Temp: (!) 97.4 F (36.3  C) 99.3 F (37.4 C)  98.7 F (37.1 C)  TempSrc:  Oral  Axillary  SpO2: (!) 72% (!) 81% (!) 79%   Weight:      Height:       No intake or output data in the 24 hours ending 04/14/23 0907 Wt Readings from Last 3 Encounters:  04/10/23 (P) 58.5 kg  03/12/23 59.4 kg  03/03/23 62 kg    Examination:  Constitutional: NAD   Data Reviewed: I have independently reviewed following labs and imaging studies    CBC Recent Labs  Lab 04/10/23 1522 04/11/23 0456 04/12/23 0200  WBC 6.4 4.6 4.4  HGB 8.1* 7.0* 7.5*  HCT 25.6* 22.2* 23.8*  PLT 97* 75* 68*  MCV 101.6* 102.8* 101.7*  MCH 32.1 32.4 32.1  MCHC 31.6 31.5 31.5  RDW 22.9* 23.3* 22.5*    Recent Labs  Lab 04/10/23 1522 04/10/23 1706 04/10/23 2251 04/11/23 0456  NA 135  --   --  137  K 3.4*  --   --  3.5  CL 107  --   --  109  CO2 19*  --   --  23  GLUCOSE 158*  --   --  108*  BUN 16  --   --  13  CREATININE 0.75  --   --  0.68  CALCIUM 8.5*  --   --  8.2*  AST 500*  --   --   --   ALT 542*  --   --   --   ALKPHOS 605*  --   --   --   BILITOT 4.3*  --   --   --   ALBUMIN 2.9*  --   --   --   LATICACIDVEN 1.9 1.5 1.1  --   INR  --  1.1 1.2  --     ------------------------------------------------------------------------------------------------------------------ No results for input(s): "CHOL", "HDL", "LDLCALC", "TRIG", "CHOLHDL", "LDLDIRECT" in the last 72 hours.  No results found for: "HGBA1C" ------------------------------------------------------------------------------------------------------------------ No results for input(s): "TSH", "T4TOTAL", "T3FREE", "THYROIDAB" in the last 72 hours.  Invalid input(s): "FREET3"  Cardiac Enzymes No results for input(s): "CKMB", "TROPONINI", "MYOGLOBIN" in the last 168 hours.  Invalid input(s): "CK" ------------------------------------------------------------------------------------------------------------------    Component Value Date/Time   BNP 32.2 02/12/2023 0330    CBG: No results for input(s): "GLUCAP" in the last 168 hours.  Recent Results (from the past 240 hours)  Resp panel by RT-PCR (RSV, Flu A&B, Covid) Anterior Nasal Swab     Status: None   Collection Time: 04/10/23  2:20 PM   Specimen: Anterior Nasal Swab  Result Value Ref Range Status   SARS Coronavirus 2 by RT PCR NEGATIVE NEGATIVE Final    Comment: (NOTE) SARS-CoV-2 target nucleic acids are NOT  DETECTED.  The SARS-CoV-2 RNA is generally detectable in upper respiratory specimens during the acute phase of infection. The lowest concentration of SARS-CoV-2 viral copies this assay can detect is 138 copies/mL. A negative result does not preclude SARS-Cov-2 infection and should not be used as the sole basis for treatment or other patient management decisions. A negative result may occur with  improper specimen collection/handling, submission of specimen other than nasopharyngeal swab, presence of viral mutation(s) within the areas targeted by this assay, and inadequate number of viral copies(<138 copies/mL). A negative result must be combined with clinical observations, patient history, and epidemiological information. The expected result is Negative.  Fact Sheet for Patients:  BloggerCourse.com  Fact Sheet for Healthcare Providers:  SeriousBroker.it  This test is no t yet approved or cleared by the [place] and  has been authorized for detection and/or diagnosis of SARS-CoV-2 by FDA under an Emergency Use Authorization (EUA). This EUA will remain  in effect (meaning this test can be used) for the duration of the COVID-19 declaration under Section 564(b)(1) of the Act, 21 U.S.C.section 360bbb-3(b)(1), unless the authorization is terminated  or revoked sooner.       Influenza A by PCR NEGATIVE NEGATIVE Final   Influenza B by PCR NEGATIVE NEGATIVE Final    Comment: (NOTE) The Xpert Xpress SARS-CoV-2/FLU/RSV plus assay is intended as an aid in the diagnosis of influenza from Nasopharyngeal swab specimens and should not be used as a sole basis for treatment. Nasal washings and aspirates are unacceptable for Xpert Xpress SARS-CoV-2/FLU/RSV testing.  Fact Sheet for Patients: BloggerCourse.com  Fact Sheet for Healthcare Providers: SeriousBroker.it  This test is not yet  approved or cleared by the [place] FDA and has been authorized for detection and/or diagnosis of SARS-CoV-2 by FDA under an Emergency Use Authorization (EUA). This EUA will remain in effect (meaning this test can be used) for the duration of the COVID-19 declaration under Section 564(b)(1) of the Act, 21 U.S.C. section 360bbb-3(b)(1), unless the authorization is terminated or revoked.     Resp Syncytial Virus by PCR NEGATIVE NEGATIVE Final    Comment: (NOTE) Fact Sheet for Patients: BloggerCourse.com  Fact Sheet for Healthcare Providers: SeriousBroker.it  This test is not yet approved or cleared by the [place] FDA and has been authorized for detection and/or diagnosis of SARS-CoV-2 by FDA under an Emergency Use Authorization (EUA). This EUA will remain in effect (meaning this test can be used) for the duration of the COVID-19 declaration under Section 564(b)(1) of the Act, 21 U.S.C. section 360bbb-3(b)(1), unless the authorization is terminated or revoked.  Performed at [facility], [address] [place], [place] [address]   Blood culture (routine x 2)     Status: None (Preliminary result)   Collection Time: 04/10/23  3:22 PM   Specimen: BLOOD  Result Value Ref Range Status   Specimen Description BLOOD LEFT ANTECUBITAL  Final   Special Requests   Final    BOTTLES DRAWN AEROBIC AND ANAEROBIC Blood Culture adequate volume   Culture   Final    NO GROWTH 4 DAYS Performed at [facility], [address], [place], [place] [address]    Report Status PENDING  Incomplete  Blood culture (routine x 2)     Status: None (Preliminary result)   Collection Time: 04/10/23  3:22 PM   Specimen: BLOOD  Result Value Ref Range Status   Specimen Description BLOOD RIGHT ANTECUBITAL  Final   Special Requests   Final    BOTTLES DRAWN AEROBIC AND ANAEROBIC Blood Culture adequate volume   Culture   Final    NO GROWTH 4 DAYS Performed  at [facility], [address] [place], [place] [address]    Report Status PENDING  Incomplete  Culture, blood (x 2)     Status: None (Preliminary result)   Collection Time: 04/10/23 10:51 PM   Specimen: BLOOD LEFT ARM  Result Value Ref Range Status   Specimen Description BLOOD LEFT ARM  Final   Special Requests   Final    BOTTLES DRAWN AEROBIC AND ANAEROBIC Blood Culture adequate volume   Culture   Final    NO GROWTH 4 DAYS Performed at [facility], [address] [place], [place]  [address]    Report Status PENDING  Incomplete     Radiology Studies: No results found.   Pamella Pert, MD, PhD [facility]  Between 7 am - 7 pm I am available, please contact me via Amion (for emergencies) or Securechat (non urgent messages)  Between 7 pm - 7 am I am not available, please contact night coverage MD/APP via Amion

## 2023-04-14 NOTE — Plan of Care (Signed)
  Problem: Pain Management: Goal: Satisfaction with pain management regimen will improve Outcome: Progressing   

## 2023-04-15 DIAGNOSIS — K831 Obstruction of bile duct: Secondary | ICD-10-CM | POA: Diagnosis not present

## 2023-04-15 LAB — CULTURE, BLOOD (ROUTINE X 2)
Special Requests: ADEQUATE
Special Requests: ADEQUATE
Special Requests: ADEQUATE

## 2023-04-15 NOTE — Plan of Care (Signed)
  Problem: Pain Managment: Goal: General experience of comfort will improve and/or be controlled Outcome: Progressing

## 2023-04-15 NOTE — Progress Notes (Signed)
 PROGRESS NOTE  Sashia Campas HKV:[id number] DOB: 1945-07-15 DOA: 04/10/2023 PCP: [name], DO   LOS: 5 days   Brief Narrative / Interim history: 78 year old female with pancreatic cancer not on chemotherapy, osteoarthritis and PE not on anticoagulation, recent admission last month for GI bleed comes into the hospital for weakness, abdominal pain.  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  It also showed volume overload with bilateral pleural effusions.  After discussion with family, she was transition to comfort  Subjective / 24h Interval events: She is unresponsive  Assesement and Plan: Principal Problem:   Obstructive jaundice Active Problems:   Sepsis due to pneumonia (HCC)   Symptomatic anemia   Anxiety and depression   Elevated troponin   Elevated transaminase level   Principal problem End-of-life care, comfort measures -continue morphine infusion.  Anticipate in-hospital death  Active problems Acute hypoxic respiratory failure Likely multifocal aspiration pneumonia Probable PE Obstructive jaundice Portal vein thrombosis TIPS thrombosis Pancreatic cancer Anemia, normocytic Thrombocytopenia  Scheduled Meds:  sodium chloride   Intravenous Once   glycopyrrolate  0.1 mg Intravenous Q4H   Continuous Infusions:  morphine 2 mg/hr (04/14/23 1812)   PRN Meds:.haloperidol **OR** haloperidol **OR** haloperidol lactate, LORazepam **OR** LORazepam **OR** LORazepam, morphine injection, ondansetron **OR** ondansetron (ZOFRAN) IV, simethicone, traZODone  Current Outpatient Medications  Medication Instructions   acetaminophen (TYLENOL) 1,000 mg, Oral, Every 8 hours PRN   buPROPion (WELLBUTRIN XL) 300 mg, Oral, Daily after breakfast    busPIRone (BUSPAR) 20 mg, Oral, 2 times daily   escitalopram (LEXAPRO) 20 mg, Oral, Daily at bedtime   ferrous sulfate 325 mg, Daily   lactose free nutrition (BOOST) LIQD 237 mLs, Oral, 2 times daily between meals   lidocaine-prilocaine (EMLA) cream Apply a small amount to port a cath site (do not rub in) and cover with plastic wrap 1 hour prior to infusion appointments   linaclotide (LINZESS) 72 mcg, Oral, Every other day, In the morning; hold it for diarrhea.   LORazepam (ATIVAN) 0.5 mg, Oral, Every 8 hours PRN   mirtazapine (REMERON) 15 mg, Oral, Daily at bedtime   ondansetron (ZOFRAN) 4 mg, Oral, Every 8 hours PRN   pantoprazole (PROTONIX) 40 mg, Oral, 2 times daily   QUEtiapine (SEROQUEL) 25 mg, Oral, 2 times daily   sucralfate (CARAFATE) 1 g, Oral, 3 times daily with meals & bedtime   Vitamin D3 5,000 Units, Oral, Every morning    Diet Orders (From admission, onward)     Start     Ordered   04/11/23 1345  Diet regular Fluid consistency: Thin  Diet effective now       Question:  Fluid consistency:  Answer:  Thin   04/11/23 1344            DVT prophylaxis:    Lab Results  Component Value Date   PLT 68 (L) 04/12/2023      Code Status: Do not attempt resuscitation (DNR) - Comfort care  Family Communication: No family at bedside  Status is: Inpatient  Level of care: Telemetry Medical  Consultants:  none  Objective: Vitals:   04/13/23 0605 04/14/23 0413 04/14/23 0738 04/14/23 0852  BP: 111/65 106/62 (!) 103/56   Pulse: (!) 104 (!) 102 98   Resp: 19 16  16   Temp: (!) 97.4 F (36.3 C) 99.3 F (37.4 C)  98.7 F (37.1 C)  TempSrc:  Oral  Axillary  SpO2: (!) 72% (!) 81% (!) 79%   Weight:      Height:        Intake/Output Summary (Last 24 hours) at 04/15/2023 1141 Last data filed at 04/15/2023 1104 Gross per 24 hour  Intake 0 ml  Output 100 ml  Net -100 ml   Wt Readings from Last 3 Encounters:  04/10/23 (P) 58.5 kg  03/12/23 59.4 kg  03/03/23 62 kg     Examination:  Constitutional: NAD   Data Reviewed: I have independently reviewed following labs and imaging studies   CBC Recent Labs  Lab 04/10/23 1522 04/11/23 0456 04/12/23 0200  WBC 6.4 4.6 4.4  HGB 8.1* 7.0* 7.5*  HCT 25.6* 22.2* 23.8*  PLT 97* 75* 68*  MCV 101.6* 102.8* 101.7*  MCH 32.1 32.4 32.1  MCHC 31.6 31.5 31.5  RDW 22.9* 23.3* 22.5*    Recent Labs  Lab 04/10/23 1522 04/10/23 1706 04/10/23 2251 04/11/23 0456  NA 135  --   --  137  K 3.4*  --   --  3.5  CL 107  --   --  109  CO2 19*  --   --  23  GLUCOSE 158*  --   --  108*  BUN 16  --   --  13  CREATININE 0.75  --   --  0.68  CALCIUM 8.5*  --   --  8.2*  AST 500*  --   --   --   ALT 542*  --   --   --   ALKPHOS 605*  --   --   --   BILITOT 4.3*  --   --   --   ALBUMIN 2.9*  --   --   --   LATICACIDVEN 1.9 1.5 1.1  --   INR  --  1.1 1.2  --     ------------------------------------------------------------------------------------------------------------------ No results for input(s): "CHOL", "HDL", "LDLCALC", "TRIG", "CHOLHDL", "LDLDIRECT" in the last 72 hours.  No results found for: "HGBA1C" ------------------------------------------------------------------------------------------------------------------ No results for input(s): "TSH", "T4TOTAL", "T3FREE", "THYROIDAB" in the last 72 hours.  Invalid input(s): "FREET3"  Cardiac Enzymes No results for input(s): "CKMB", "TROPONINI", "MYOGLOBIN" in the last 168 hours.  Invalid input(s): "CK" ------------------------------------------------------------------------------------------------------------------    Component Value Date/Time   BNP 32.2 02/12/2023 0330    CBG: No results for input(s): "GLUCAP" in the last 168 hours.  Recent Results (from the past 240 hours)  Resp panel by RT-PCR (RSV, Flu A&B, Covid) Anterior Nasal Swab     Status: None   Collection Time: 04/10/23  2:20 PM   Specimen: Anterior Nasal Swab  Result Value Ref Range  Status   SARS Coronavirus 2 by RT PCR NEGATIVE NEGATIVE Final    Comment: (NOTE) SARS-CoV-2 target nucleic acids are NOT DETECTED.  The SARS-CoV-2 RNA is generally detectable in upper respiratory specimens during the acute phase of infection. The lowest concentration of SARS-CoV-2 viral copies this assay can detect is 138 copies/mL. A negative result does not preclude SARS-Cov-2 infection and should not be used as the sole basis for treatment or other patient management decisions. A negative result may occur with  improper specimen collection/handling, submission of specimen other than nasopharyngeal swab, presence of viral mutation(s) within the areas targeted by this assay, and inadequate number of viral copies(<138 copies/mL). A negative result must be  combined with clinical observations, patient history, and epidemiological information. The expected result is Negative.  Fact Sheet for Patients:  BloggerCourse.com  Fact Sheet for Healthcare Providers:  SeriousBroker.it  This test is no t yet approved or cleared by the [place] FDA and  has been authorized for detection and/or diagnosis of SARS-CoV-2 by FDA under an Emergency Use Authorization (EUA). This EUA will remain  in effect (meaning this test can be used) for the duration of the COVID-19 declaration under Section 564(b)(1) of the Act, 21 U.S.C.section 360bbb-3(b)(1), unless the authorization is terminated  or revoked sooner.       Influenza A by PCR NEGATIVE NEGATIVE Final   Influenza B by PCR NEGATIVE NEGATIVE Final    Comment: (NOTE) The Xpert Xpress SARS-CoV-2/FLU/RSV plus assay is intended as an aid in the diagnosis of influenza from Nasopharyngeal swab specimens and should not be used as a sole basis for treatment. Nasal washings and aspirates are unacceptable for Xpert Xpress SARS-CoV-2/FLU/RSV testing.  Fact Sheet for  Patients: BloggerCourse.com  Fact Sheet for Healthcare Providers: SeriousBroker.it  This test is not yet approved or cleared by the [place] FDA and has been authorized for detection and/or diagnosis of SARS-CoV-2 by FDA under an Emergency Use Authorization (EUA). This EUA will remain in effect (meaning this test can be used) for the duration of the COVID-19 declaration under Section 564(b)(1) of the Act, 21 U.S.C. section 360bbb-3(b)(1), unless the authorization is terminated or revoked.     Resp Syncytial Virus by PCR NEGATIVE NEGATIVE Final    Comment: (NOTE) Fact Sheet for Patients: BloggerCourse.com  Fact Sheet for Healthcare Providers: SeriousBroker.it  This test is not yet approved or cleared by the [place] FDA and has been authorized for detection and/or diagnosis of SARS-CoV-2 by FDA under an Emergency Use Authorization (EUA). This EUA will remain in effect (meaning this test can be used) for the duration of the COVID-19 declaration under Section 564(b)(1) of the Act, 21 U.S.C. section 360bbb-3(b)(1), unless the authorization is terminated or revoked.  Performed at [facility], [address] [place], [place] [address]   Blood culture (routine x 2)     Status: None   Collection Time: 04/10/23  3:22 PM   Specimen: BLOOD  Result Value Ref Range Status   Specimen Description BLOOD LEFT ANTECUBITAL  Final   Special Requests   Final    BOTTLES DRAWN AEROBIC AND ANAEROBIC Blood Culture adequate volume   Culture   Final    NO GROWTH 5 DAYS Performed at [facility], [address] [place], [place] [address]    Report Status 04/15/2023 FINAL  Final  Blood culture (routine x 2)     Status: None   Collection Time: 04/10/23  3:22 PM   Specimen: BLOOD  Result Value Ref Range Status   Specimen Description BLOOD RIGHT ANTECUBITAL  Final   Special Requests   Final     BOTTLES DRAWN AEROBIC AND ANAEROBIC Blood Culture adequate volume   Culture   Final    NO GROWTH 5 DAYS Performed at [facility], [address], [place], [place] [address]    Report Status 04/15/2023 FINAL  Final  Culture, blood (x 2)     Status: None   Collection Time: 04/10/23 10:51 PM   Specimen: BLOOD LEFT ARM  Result Value Ref Range Status   Specimen Description BLOOD LEFT ARM  Final   Special Requests   Final    BOTTLES DRAWN AEROBIC AND ANAEROBIC Blood  Culture adequate volume   Culture   Final    NO GROWTH 5 DAYS Performed at [facility], [address] [place], [place] [address]    Report Status 04/15/2023 FINAL  Final     Radiology Studies: No results found.   Pamella Pert, MD, PhD [facility]  Between 7 am - 7 pm I am available, please contact me via Amion (for emergencies) or Securechat (non urgent messages)  Between 7 pm - 7 am I am not available, please contact night coverage MD/APP via Amion

## 2023-04-15 NOTE — Plan of Care (Signed)
  Problem: Pain Management: Goal: Satisfaction with pain management regimen will improve Outcome: Progressing   Problem: Pain Managment: Goal: General experience of comfort will improve and/or be controlled Outcome: Progressing   Problem: Clinical Measurements: Goal: Respiratory complications will improve Outcome: Not Progressing

## 2023-04-16 DIAGNOSIS — K831 Obstruction of bile duct: Secondary | ICD-10-CM | POA: Diagnosis not present

## 2023-04-16 MED ORDER — MORPHINE SULFATE (PF) 4 MG/ML IV SOLN: 4.0000 mg | INTRAVENOUS | Status: DC | PRN

## 2023-04-16 MED ORDER — SODIUM CHLORIDE 0.9% FLUSH: 10.0000 mL | INTRAVENOUS | Status: DC | PRN

## 2023-04-16 MED ORDER — LORAZEPAM 2 MG/ML IJ SOLN
1.0000 mg | INTRAMUSCULAR | Status: DC
  Administered 2023-04-16 – 2023-04-18 (×12): 1 mg via INTRAVENOUS
  Filled 2023-04-16 (×12): qty 1

## 2023-04-16 NOTE — Progress Notes (Signed)
 PROGRESS NOTE  Sheryl Bender ZOX:[id number] DOB: 07-Nov-1945 DOA: 04/10/2023 PCP: [name], DO   LOS: 6 days   Brief Narrative / Interim history: 78 year old female with pancreatic cancer not on chemotherapy, osteoarthritis and PE not on anticoagulation, recent admission last month for GI bleed comes into the hospital for weakness, abdominal pain.  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  It also showed volume overload with bilateral pleural effusions.  After discussion with family, she was transition to comfort  Subjective / 24h Interval events: She is unresponsive, agonal breathing.  Son is at bedside and he feels like she is uncomfortable at times  Assesement and Plan: Principal Problem:   Obstructive jaundice Active Problems:   Sepsis due to pneumonia (HCC)   Symptomatic anemia   Anxiety and depression   Elevated troponin   Elevated transaminase level   Principal problem End-of-life care, comfort measures -continue morphine infusion.  Anticipate in-hospital death.  Family feels she is uncomfortable, will increase morphine and schedule Ativan for anxiety  Active problems Acute hypoxic respiratory failure Likely multifocal aspiration pneumonia Probable PE Obstructive jaundice Portal vein thrombosis TIPS thrombosis Pancreatic cancer Anemia, normocytic Thrombocytopenia  Scheduled Meds:  sodium chloride   Intravenous Once   glycopyrrolate  0.1 mg Intravenous Q4H   LORazepam  1 mg Intravenous Q4H   Continuous Infusions:  morphine 4 mg/hr (04/16/23 1148)   PRN Meds:.haloperidol **OR** haloperidol **OR** haloperidol lactate, morphine injection, ondansetron **OR** ondansetron (ZOFRAN) IV, simethicone, sodium chloride flush,  traZODone  Current Outpatient Medications  Medication Instructions   acetaminophen (TYLENOL) 1,000 mg, Oral, Every 8 hours PRN   buPROPion (WELLBUTRIN XL) 300 mg, Oral, Daily after breakfast   busPIRone (BUSPAR) 20 mg, Oral, 2 times daily   escitalopram (LEXAPRO) 20 mg, Oral, Daily at bedtime   ferrous sulfate 325 mg, Daily   lactose free nutrition (BOOST) LIQD 237 mLs, Oral, 2 times daily between meals   lidocaine-prilocaine (EMLA) cream Apply a small amount to port a cath site (do not rub in) and cover with plastic wrap 1 hour prior to infusion appointments   linaclotide (LINZESS) 72 mcg, Oral, Every other day, In the morning; hold it for diarrhea.   LORazepam (ATIVAN) 0.5 mg, Oral, Every 8 hours PRN   mirtazapine (REMERON) 15 mg, Oral, Daily at bedtime   ondansetron (ZOFRAN) 4 mg, Oral, Every 8 hours PRN   pantoprazole (PROTONIX) 40 mg, Oral, 2 times daily   QUEtiapine (SEROQUEL) 25 mg, Oral, 2 times daily   sucralfate (CARAFATE) 1 g, Oral, 3 times daily with meals & bedtime   Vitamin D3 5,000 Units, Oral, Every morning    Diet Orders (From admission, onward)     Start     Ordered   04/11/23 1345  Diet regular Fluid consistency: Thin  Diet effective now       Question:  Fluid consistency:  Answer:  Thin   04/11/23 1344            DVT prophylaxis:    Lab Results  Component Value Date   PLT 68 (L) 04/12/2023      Code Status: Do not attempt resuscitation (DNR) - Comfort care  Family Communication: No family at bedside  Status is: Inpatient  Level of care: Telemetry Medical  Consultants:  none  Objective: Vitals:   04/14/23 0413 04/14/23 0738 04/14/23 0852 04/16/23 0445  BP: 106/62 (!) 103/56  127/78  Pulse: (!) 102 98  (!) 104  Resp: 16 16  12   Temp: 99.3 F (37.4 C)  98.7 F (37.1 C) 98.2 F (36.8 C)  TempSrc: Oral  Axillary Oral  SpO2: (!) 81% (!) 79%  90%  Weight:      Height:        Intake/Output Summary (Last 24 hours) at 04/16/2023 1152 Last  data filed at 04/15/2023 1637 Gross per 24 hour  Intake 0 ml  Output 200 ml  Net -200 ml   Wt Readings from Last 3 Encounters:  04/10/23 (P) 58.5 kg  03/12/23 59.4 kg  03/03/23 62 kg    Examination:  Constitutional: NAD   Data Reviewed: I have independently reviewed following labs and imaging studies   CBC Recent Labs  Lab 04/10/23 1522 04/11/23 0456 04/12/23 0200  WBC 6.4 4.6 4.4  HGB 8.1* 7.0* 7.5*  HCT 25.6* 22.2* 23.8*  PLT 97* 75* 68*  MCV 101.6* 102.8* 101.7*  MCH 32.1 32.4 32.1  MCHC 31.6 31.5 31.5  RDW 22.9* 23.3* 22.5*    Recent Labs  Lab 04/10/23 1522 04/10/23 1706 04/10/23 2251 04/11/23 0456  NA 135  --   --  137  K 3.4*  --   --  3.5  CL 107  --   --  109  CO2 19*  --   --  23  GLUCOSE 158*  --   --  108*  BUN 16  --   --  13  CREATININE 0.75  --   --  0.68  CALCIUM 8.5*  --   --  8.2*  AST 500*  --   --   --   ALT 542*  --   --   --   ALKPHOS 605*  --   --   --   BILITOT 4.3*  --   --   --   ALBUMIN 2.9*  --   --   --   LATICACIDVEN 1.9 1.5 1.1  --   INR  --  1.1 1.2  --     ------------------------------------------------------------------------------------------------------------------ No results for input(s): "CHOL", "HDL", "LDLCALC", "TRIG", "CHOLHDL", "LDLDIRECT" in the last 72 hours.  No results found for: "HGBA1C" ------------------------------------------------------------------------------------------------------------------ No results for input(s): "TSH", "T4TOTAL", "T3FREE", "THYROIDAB" in the last 72 hours.  Invalid input(s): "FREET3"  Cardiac Enzymes No results for input(s): "CKMB", "TROPONINI", "MYOGLOBIN" in the last 168 hours.  Invalid input(s): "CK" ------------------------------------------------------------------------------------------------------------------    Component Value Date/Time   BNP 32.2 02/12/2023 0330    CBG: No results for input(s): "GLUCAP" in the last 168 hours.  Recent Results (from the  past 240 hours)  Resp panel by RT-PCR (RSV, Flu A&B, Covid) Anterior Nasal Swab     Status: None   Collection Time: 04/10/23  2:20 PM   Specimen: Anterior Nasal Swab  Result Value Ref Range Status   SARS Coronavirus 2 by RT PCR NEGATIVE NEGATIVE Final    Comment: (NOTE) SARS-CoV-2 target nucleic acids are NOT DETECTED.  The SARS-CoV-2 RNA is generally detectable in upper respiratory specimens during the acute phase of infection. The lowest concentration of SARS-CoV-2 viral copies this assay can detect is 138 copies/mL. A negative result does not preclude SARS-Cov-2 infection and should not be used as the sole basis for treatment or other patient management decisions. A negative result may occur with  improper specimen collection/handling, submission of specimen other than nasopharyngeal swab, presence of viral mutation(s) within the areas targeted by this assay, and inadequate number of viral copies(<138 copies/mL). A negative result must be combined with clinical observations, patient history, and epidemiological information. The expected result is Negative.  Fact Sheet for Patients:  BloggerCourse.com  Fact Sheet for Healthcare Providers:  SeriousBroker.it  This test is no t yet approved or cleared by the [place] FDA and  has been authorized for detection and/or diagnosis of SARS-CoV-2 by FDA under an Emergency Use Authorization (EUA). This EUA will remain  in effect (meaning this test can be used) for the duration of the COVID-19 declaration under Section 564(b)(1) of the Act, 21 U.S.C.section 360bbb-3(b)(1), unless the authorization is terminated  or revoked sooner.       Influenza A by PCR NEGATIVE NEGATIVE Final   Influenza B by PCR NEGATIVE NEGATIVE Final    Comment: (NOTE) The Xpert Xpress SARS-CoV-2/FLU/RSV plus assay is intended as an aid in the diagnosis of influenza from Nasopharyngeal swab specimens  and should not be used as a sole basis for treatment. Nasal washings and aspirates are unacceptable for Xpert Xpress SARS-CoV-2/FLU/RSV testing.  Fact Sheet for Patients: BloggerCourse.com  Fact Sheet for Healthcare Providers: SeriousBroker.it  This test is not yet approved or cleared by the [place] FDA and has been authorized for detection and/or diagnosis of SARS-CoV-2 by FDA under an Emergency Use Authorization (EUA). This EUA will remain in effect (meaning this test can be used) for the duration of the COVID-19 declaration under Section 564(b)(1) of the Act, 21 U.S.C. section 360bbb-3(b)(1), unless the authorization is terminated or revoked.     Resp Syncytial Virus by PCR NEGATIVE NEGATIVE Final    Comment: (NOTE) Fact Sheet for Patients: BloggerCourse.com  Fact Sheet for Healthcare Providers: SeriousBroker.it  This test is not yet approved or cleared by the [place] FDA and has been authorized for detection and/or diagnosis of SARS-CoV-2 by FDA under an Emergency Use Authorization (EUA). This EUA will remain in effect (meaning this test can be used) for the duration of the COVID-19 declaration under Section 564(b)(1) of the Act, 21 U.S.C. section 360bbb-3(b)(1), unless the authorization is terminated or revoked.  Performed at [facility], [address], [place], [place] [address]   Blood culture (routine x 2)     Status: None   Collection Time: 04/10/23  3:22 PM   Specimen: BLOOD  Result Value Ref Range Status   Specimen Description BLOOD LEFT ANTECUBITAL  Final   Special Requests   Final    BOTTLES DRAWN AEROBIC AND ANAEROBIC Blood Culture adequate volume   Culture   Final    NO GROWTH 5 DAYS Performed at [facility], [address] [place], [place] [address]    Report Status 04/15/2023 FINAL  Final  Blood culture (routine x 2)     Status: None    Collection Time: 04/10/23  3:22 PM   Specimen: BLOOD  Result Value Ref Range Status   Specimen Description BLOOD RIGHT ANTECUBITAL  Final   Special Requests   Final    BOTTLES DRAWN AEROBIC AND ANAEROBIC Blood Culture adequate volume   Culture   Final    NO GROWTH 5 DAYS Performed at [facility], [address], [place], [place] [address]    Report Status 04/15/2023 FINAL  Final  Culture, blood (x 2)     Status: None   Collection Time: 04/10/23 10:51 PM   Specimen: BLOOD  LEFT ARM  Result Value Ref Range Status   Specimen Description BLOOD LEFT ARM  Final   Special Requests   Final    BOTTLES DRAWN AEROBIC AND ANAEROBIC Blood Culture adequate volume   Culture   Final    NO GROWTH 5 DAYS Performed at [facility], [address], [place], [place] [address]    Report Status 04/15/2023 FINAL  Final     Radiology Studies: No results found.   Pamella Pert, MD, PhD [facility]  Between 7 am - 7 pm I am available, please contact me via Amion (for emergencies) or Securechat (non urgent messages)  Between 7 pm - 7 am I am not available, please contact night coverage MD/APP via Amion

## 2023-04-16 NOTE — Progress Notes (Signed)
 Patient has a PAC on Rt chest. It passed due for needle change, however, patient's comfort care at this time. Explained patient's family member regarding needle change. Patient family was okay without change needle if it doesn't show any infection s/s. Assessed PAC site and no s/s of infection. No change needle until family requesting. HS [employer]

## 2023-04-16 NOTE — Plan of Care (Signed)
  Problem: Education: Goal: Knowledge of General Education information will improve Description: Including pain rating scale, medication(s)/side effects and non-pharmacologic comfort measures Outcome: Not Applicable   Problem: Health Behavior/Discharge Planning: Goal: Ability to manage health-related needs will improve Outcome: Not Applicable   Problem: Clinical Measurements: Goal: Ability to maintain clinical measurements within normal limits will improve Outcome: Not Applicable Goal: Will remain free from infection Outcome: Not Applicable Goal: Diagnostic test results will improve Outcome: Not Applicable

## 2023-04-16 NOTE — Plan of Care (Signed)
  Problem: Pain Managment: Goal: General experience of comfort will improve and/or be controlled Outcome: Progressing

## 2023-04-16 NOTE — Progress Notes (Signed)
 Son, Christian Treadway would like to be called if patient passes while he is not here. [phone]. Also, spoke with patient's son wants [facility] in [place] phone [phone]

## 2023-04-17 DIAGNOSIS — K831 Obstruction of bile duct: Secondary | ICD-10-CM | POA: Diagnosis not present

## 2023-04-17 NOTE — Plan of Care (Signed)
  Problem: Pain Management: Goal: Satisfaction with pain management regimen will improve Outcome: Progressing   Problem: Clinical Measurements: Goal: Respiratory complications will improve Outcome: Progressing Goal: Cardiovascular complication will be avoided Outcome: Progressing   Problem: Activity: Goal: Risk for activity intolerance will decrease Outcome: Progressing   Problem: Nutrition: Goal: Adequate nutrition will be maintained Outcome: Progressing   Problem: Coping: Goal: Level of anxiety will decrease Outcome: Progressing   Problem: Elimination: Goal: Will not experience complications related to bowel motility Outcome: Progressing Goal: Will not experience complications related to urinary retention Outcome: Progressing   Problem: Pain Managment: Goal: General experience of comfort will improve and/or be controlled Outcome: Progressing   Problem: Safety: Goal: Ability to remain free from injury will improve Outcome: Progressing   Problem: Skin Integrity: Goal: Risk for impaired skin integrity will decrease Outcome: Progressing

## 2023-04-17 NOTE — Progress Notes (Signed)
  Progress Note   [name] ZOX:[id number] DOB: 12-27-1945 DOA: 04/10/2023     7 DOS: the patient was seen and examined on 04/17/2023   Brief hospital course: 78 year old female with pancreatic cancer not on chemotherapy, osteoarthritis and PE not on anticoagulation, recent admission last month for GI bleed comes into the hospital for weakness, abdominal pain. 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 It also showed volume overload with bilateral pleural effusions. 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  Morphine/Ativan in place.  Anticipate in-hospital death.       Subjective: Patient resting comfortably.  Encephalopathic and minimally responsive but appears in no acute distress.  Physical Exam: Vitals:   04/14/23 0738 04/14/23 0852 04/16/23 0445 04/17/23 0448  BP: (!) 103/56  127/78 105/69  Pulse: 98  (!) 104 98  Resp: 16  12 16   Temp:  98.7 F (37.1 C) 98.2 F (36.8 C) 97.9 F (36.6 C)  TempSrc:  Axillary Oral Oral  SpO2: (!) 79%  90%   Weight:      Height:       GENERAL: Cachectic, no distress RESPIRATORY: Normal respiratory effort GASTROINTESTINAL:  nondistended EXTREMITIES: Frail    Data Reviewed:  There are no new results to review at this time.  Family Communication: None at bedside  Disposition: Status is: Inpatient Remains inpatient appropriate because: Comfort care  Planned Discharge Destination:  Likely will expire in the  hospital    Time spent: 25 minutes  Author: Deanna Artis, DO 04/17/2023 12:24 PM  For on call review [email or web address].

## 2023-04-18 DIAGNOSIS — K831 Obstruction of bile duct: Secondary | ICD-10-CM | POA: Diagnosis not present

## 2023-04-20 ENCOUNTER — Inpatient Hospital Stay: Payer: Medicare Other

## 2023-04-26 NOTE — Progress Notes (Signed)
 Family called per request at [phone] no one answered. 2nd number called at [phone], patient's son's wife answered phone stated she would relay msg to son. Patient's breathing has become more shallow, and pulse has become weaker and more faint.

## 2023-04-26 NOTE — Death Summary Note (Signed)
 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 It also showed volume overload with bilateral pleural effusions. 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  Morphine/Ativan in place.    Pt expired peacefully at 1125 on 05-15-2023       Procedures: None  Consultations: None  The results of significant diagnostics from this hospitalization (including imaging, microbiology, ancillary and laboratory) are listed below for reference.   Significant Diagnostic Studies: CT ABDOMEN PELVIS W  CONTRAST Result Date: 05-07-2023 CLINICAL DATA:  Pancreatic cancer. Portal vein thrombosis. Thrombosed tips shunt identified on comparison CT. EXAM: CT ABDOMEN AND PELVIS WITH CONTRAST TECHNIQUE: Multidetector CT imaging of the abdomen and pelvis was performed using the standard protocol following bolus administration of intravenous contrast. RADIATION DOSE REDUCTION: This exam was performed according to the departmental dose-optimization program which includes automated exposure control, adjustment of the mA and/or kV according to patient size and/or use of iterative reconstruction technique. CONTRAST:  OMNIPAQUE IOHEXOL 300 MG/ML  SOLN COMPARISON:  CT a 02/25/2023 FINDINGS: Lower chest: Bilateral pleural effusions with associated bibasilar atelectasis. No pulmonary edema. Pneumonia. Hepatobiliary: Again demonstrated the tip shunt is thrombosed. Thrombus extends into the IVC above the tip shunt below the atrium (image 5/series 5.) the portal veins are thrombosed. There is a moderate volume of free fluid along the margin of the liver similar comparison exam. Small amount of fluid along the LEFT and RIGHT pericolic gutter which collects the pelvis volume ascites similar to prior CT normal volume spleen Pancreas: Pancreas demonstrates chronic dilatation of the duct and atrophy. There is a mass the pancreas unchanged measuring 18 mm image 29/5. Spleen: Normal volume spleen Adrenals/urinary tract: Adrenal glands and kidneys are normal. The ureters and bladder normal. Stomach/Bowel: Stomach, small bowel, appendix, and cecum are normal. The colon and rectosigmoid colon are normal. Vascular/Lymphatic: Aorta is normal. Thrombosis of the portal vein as above. Reproductive: Uterus unremarkable Other: Moderate volume of free fluid/ascites similar to comparison CT Musculoskeletal: No aggressive osseous lesion. IMPRESSION: 1. No significant change from CT 02/25/2023. 2. Persistent occlusion / thrombosis of the portal  veins andTIPS shunt. 3. Moderate to large volume ascites not changed in volume. 4. Pancreatic head mass unchanged. 5. No evidence of bowel obstruction. 6. Bilateral pleural effusions bibasilar atelectasis. No evidence  of pneumonia. Electronically Signed   By: Genevive Bi M.D.   On: 04/10/2023 19:11   DG Chest Portable 1 View Result Date: 04/10/2023 CLINICAL DATA:  Short of breath EXAM: PORTABLE CHEST 1 VIEW COMPARISON:  None Available. FINDINGS: Port in the anterior chest wall with tip in distal SVC. Density of the RIGHT cardiophrenic angle. LEFT basilar atelectasis. Upper lungs are clear. No pneumothorax. IMPRESSION: Medial RIGHT lower alobe density representing pneumonia, atelectasis or aspiration pneumonitis. Electronically Signed   By: Genevive Bi M.D.   On: 04/10/2023 15:38    Microbiology: Recent Results (from the past 240 hours)  Resp panel by RT-PCR (RSV, Flu A&B, Covid) Anterior Nasal Swab     Status: None   Collection Time: 04/10/23  2:20 PM   Specimen: Anterior Nasal Swab  Result Value Ref Range Status   SARS Coronavirus 2 by RT PCR NEGATIVE NEGATIVE Final    Comment: (NOTE) SARS-CoV-2 target nucleic acids are NOT DETECTED.  The SARS-CoV-2 RNA is generally detectable in upper respiratory specimens during the acute phase of infection. The lowest concentration of SARS-CoV-2 viral copies this assay can detect is 138 copies/mL. A negative result does not preclude SARS-Cov-2 infection and should not be used as the sole basis for treatment or other patient management decisions. A negative result may occur with  improper specimen collection/handling, submission of specimen other than nasopharyngeal swab, presence of viral mutation(s) within the areas targeted by this assay, and inadequate number of viral copies(<138 copies/mL). A negative result must be combined with clinical observations, patient history, and epidemiological information. The expected result is  Negative.  Fact Sheet for Patients:  BloggerCourse.com  Fact Sheet for Healthcare Providers:  SeriousBroker.it  This test is no t yet approved or cleared by the [place] FDA and  has been authorized for detection and/or diagnosis of SARS-CoV-2 by FDA under an Emergency Use Authorization (EUA). This EUA will remain  in effect (meaning this test can be used) for the duration of the COVID-19 declaration under Section 564(b)(1) of the Act, 21 U.S.C.section 360bbb-3(b)(1), unless the authorization is terminated  or revoked sooner.       Influenza A by PCR NEGATIVE NEGATIVE Final   Influenza B by PCR NEGATIVE NEGATIVE Final    Comment: (NOTE) The Xpert Xpress SARS-CoV-2/FLU/RSV plus assay is intended as an aid in the diagnosis of influenza from Nasopharyngeal swab specimens and should not be used as a sole basis for treatment. Nasal washings and aspirates are unacceptable for Xpert Xpress SARS-CoV-2/FLU/RSV testing.  Fact Sheet for Patients: BloggerCourse.com  Fact Sheet for Healthcare Providers: SeriousBroker.it  This test is not yet approved or cleared by the [place] FDA and has been authorized for detection and/or diagnosis of SARS-CoV-2 by FDA under an Emergency Use Authorization (EUA). This EUA will remain in effect (meaning this test can be used) for the duration of the COVID-19 declaration under Section 564(b)(1) of the Act, 21 U.S.C. section 360bbb-3(b)(1), unless the authorization is terminated or revoked.     Resp Syncytial Virus by PCR NEGATIVE NEGATIVE Final    Comment: (NOTE) Fact Sheet for Patients: BloggerCourse.com  Fact Sheet for Healthcare Providers: SeriousBroker.it  This test is not yet approved or cleared by the [place] FDA and has been authorized for detection and/or diagnosis of  SARS-CoV-2 by FDA under an Emergency Use Authorization (EUA). This EUA will remain in effect (meaning this test can be used) for the duration of the COVID-19 declaration under Section 564(b)(1) of the Act,  21 U.S.C. section 360bbb-3(b)(1), unless the authorization is terminated or revoked.  Performed at [facility], [address] [place], [place] [address]   Blood culture (routine x 2)     Status: None   Collection Time: 04/10/23  3:22 PM   Specimen: BLOOD  Result Value Ref Range Status   Specimen Description BLOOD LEFT ANTECUBITAL  Final   Special Requests   Final    BOTTLES DRAWN AEROBIC AND ANAEROBIC Blood Culture adequate volume   Culture   Final    NO GROWTH 5 DAYS Performed at [facility], [address] [place], [place] [address]    Report Status 04/15/2023 FINAL  Final  Blood culture (routine x 2)     Status: None   Collection Time: 04/10/23  3:22 PM   Specimen: BLOOD  Result Value Ref Range Status   Specimen Description BLOOD RIGHT ANTECUBITAL  Final   Special Requests   Final    BOTTLES DRAWN AEROBIC AND ANAEROBIC Blood Culture adequate volume   Culture   Final    NO GROWTH 5 DAYS Performed at [facility], [address] [place], [place] [address]    Report Status 04/15/2023 FINAL  Final  Culture, blood (x 2)     Status: None   Collection Time: 04/10/23 10:51 PM   Specimen: BLOOD LEFT ARM  Result Value Ref Range Status   Specimen Description BLOOD LEFT ARM  Final   Special Requests   Final    BOTTLES DRAWN AEROBIC AND ANAEROBIC Blood Culture adequate volume   Culture   Final    NO GROWTH 5 DAYS Performed at [facility], [address], [place], [place] [address]    Report Status 04/15/2023 FINAL  Final    Time spent: 25 minutes  Signed: Deanna Artis, DO 04/04/2023

## 2023-04-26 NOTE — Progress Notes (Signed)
 Pt belongings found in room after pt was removed , family contacted by Diplomatic Services operational officer. Kristie notified the secretary that any belongings left at the hospital were unwanted, no need to keep the belongings.

## 2023-04-26 NOTE — Progress Notes (Signed)
  Progress Note   [name] ZOX:[id number] DOB: 07-28-1945 DOA: 04/10/2023     8 DOS: the patient was seen and examined on 04/07/2023   Brief hospital course: 78 year old female with pancreatic cancer not on chemotherapy, osteoarthritis and PE not on anticoagulation, recent admission last month for GI bleed comes into the hospital for weakness, abdominal pain. 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 It also showed volume overload with bilateral pleural effusions. 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  Morphine/Ativan in place.  Anticipate in-hospital death.       Subjective: Patient resting comfortably.  minimally responsive but appears in no acute distress.  Physical Exam: Vitals:   04/14/23 0852 04/16/23 0445 04/17/23 0448 04/06/2023 0430  BP:  127/78 105/69 (!) 76/58  Pulse:  (!) 104 98 (!) 109  Resp:  12 16 16   Temp: 98.7 F (37.1 C) 98.2 F (36.8 C) 97.9 F (36.6 C) 98.4 F (36.9 C)  TempSrc: Axillary Oral Oral Oral  SpO2:  90%  (!) 71%  Weight:      Height:       GENERAL: Cachectic, no distress RESPIRATORY: Normal respiratory effort GASTROINTESTINAL:  nondistended EXTREMITIES: Frail    Data Reviewed:  There are no new results to review at this time.  Family Communication: None at bedside  Disposition: Status is: Inpatient Remains inpatient appropriate because: Comfort care  Planned Discharge Destination:  Likely will expire in  the hospital    Time spent: 25 minutes  Author: Deanna Artis, DO 04/01/2023 10:30 AM  For on call review [email or web address].

## 2023-04-26 DEATH — deceased

## 2023-05-04 ENCOUNTER — Inpatient Hospital Stay: Payer: Medicare Other

## 2023-05-04 ENCOUNTER — Inpatient Hospital Stay: Payer: Medicare Other | Admitting: Hematology

## 2024-01-20 IMAGING — DX DG CHEST 2V
2 series · 2 of 2 positions shown · non-contrast
Comparison: None Available.

CLINICAL DATA: Chest pain

EXAM:
CHEST - 2 VIEW

[chest pa]
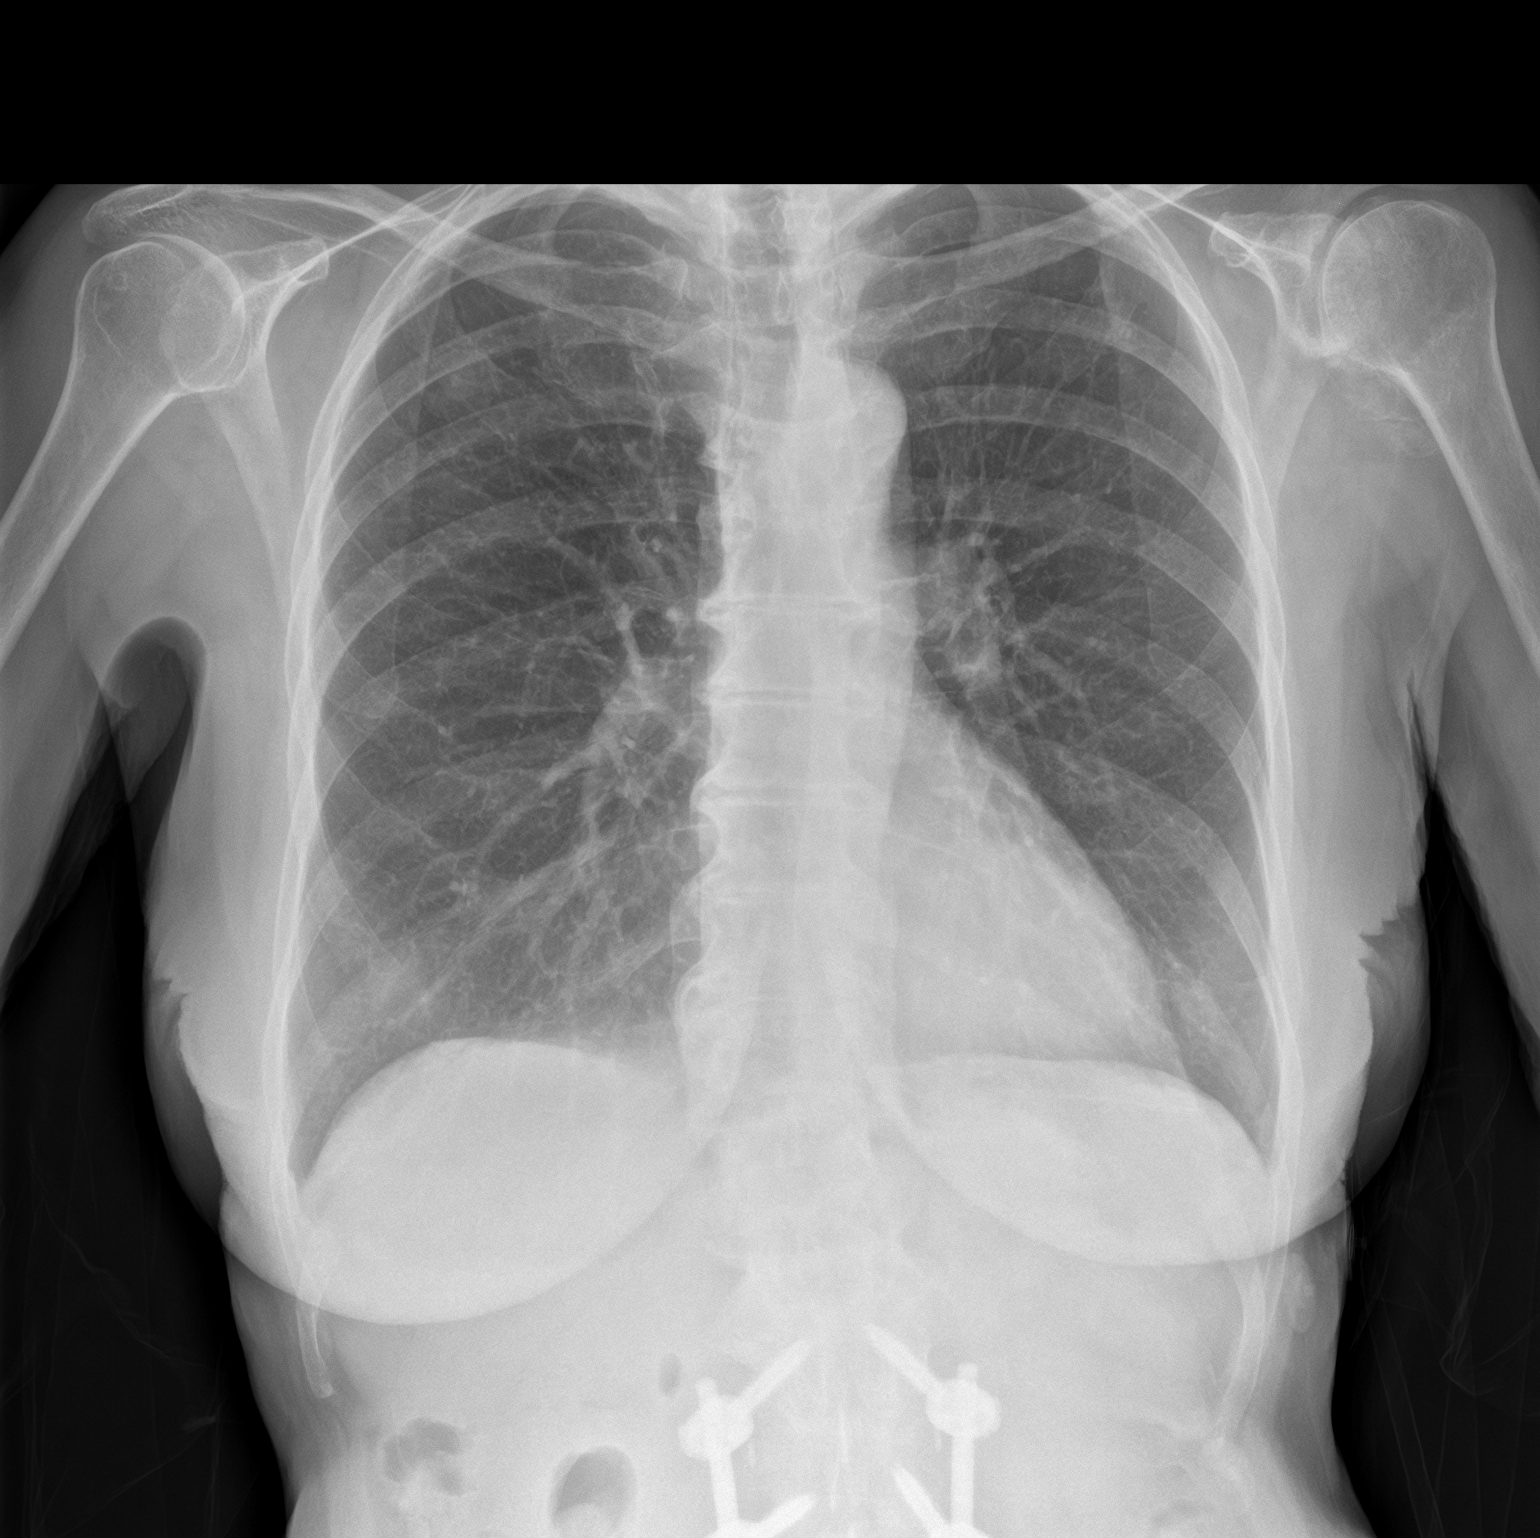

[chest lat]
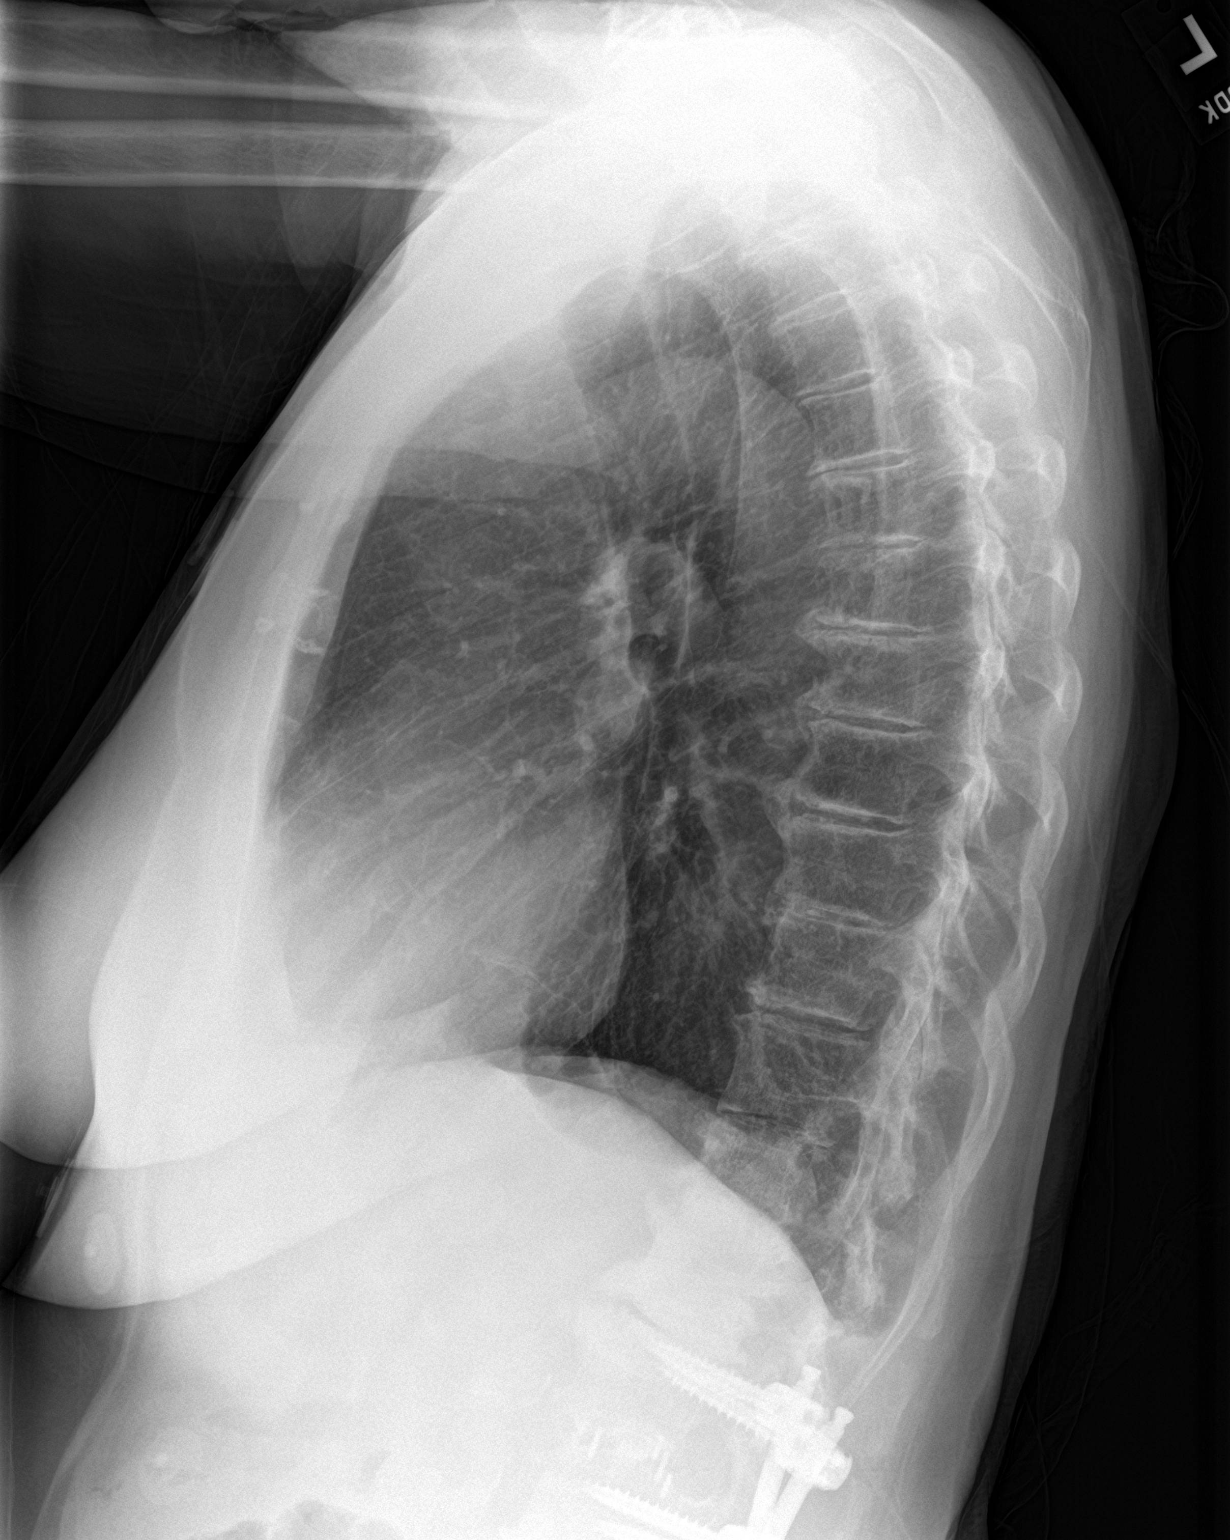

[2 of 2 positions shown; findings below may reference images not displayed]

FINDINGS: Heart size and mediastinal contours are within normal limits. No
suspicious pulmonary opacities identified.

No pleural effusion or pneumothorax visualized.

No acute osseous abnormality appreciated.
IMPRESSION: No acute intrathoracic process identified.

## 2024-01-20 IMAGING — CT CT ABD-PELV W/ CM
2 of 5 series · 15 of 46 positions shown, 17 images · IV contrast (Omnipaque or Isovue)
Comparison: MRI 02/06/2021

CLINICAL DATA: Abdominal pain.  Recent upper endoscopy 07/09/2021

EXAM:
CT ABDOMEN AND PELVIS WITH CONTRAST
TECHNIQUE: Multidetector CT imaging of the abdomen and pelvis was performed
using the standard protocol following bolus administration of
intravenous contrast.

[Series 2: axial st · axial · 0.77mm/px · z∈[-777,-412]mm · 12 of 83 slices shown, 14 images]
[im 5/83  soft-tissue]
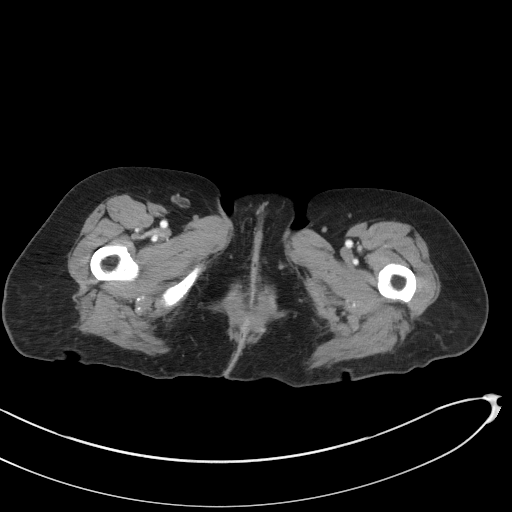
[im 5/83  bone]
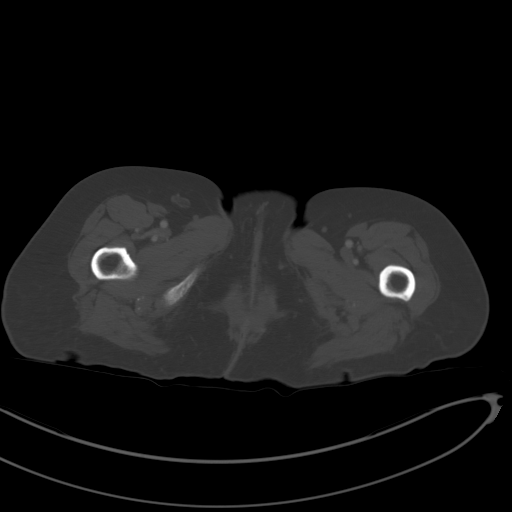
[im 13/83  soft-tissue]
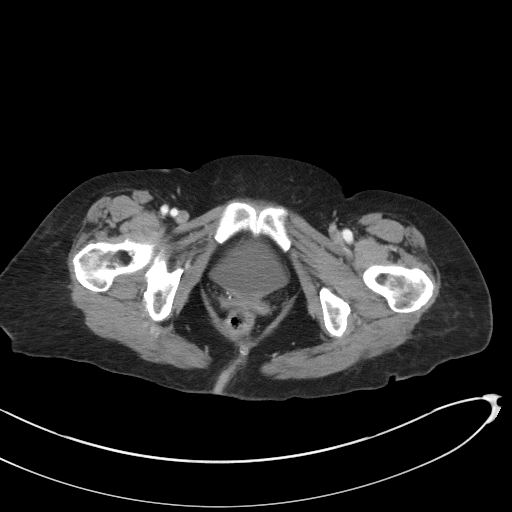
[im 18/83  soft-tissue]
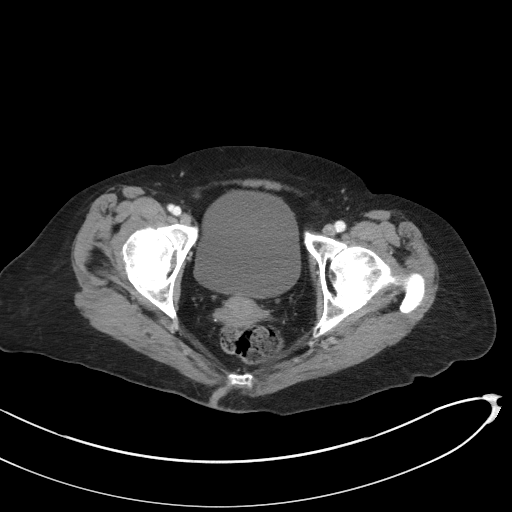
[im 26/83  soft-tissue]
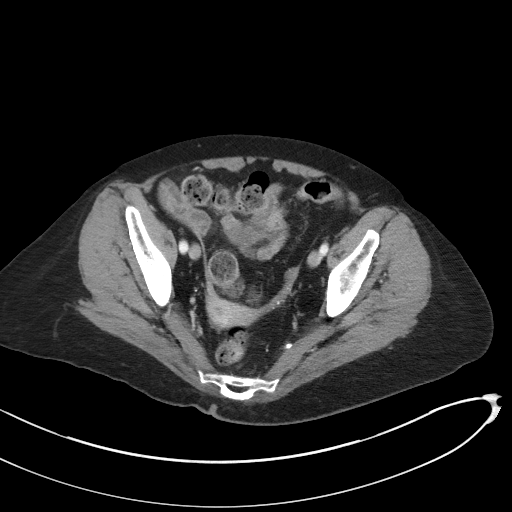
[im 31/83  soft-tissue]
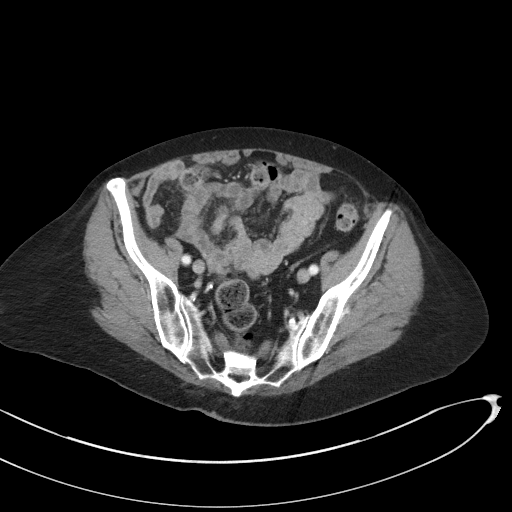
[im 39/83  soft-tissue]
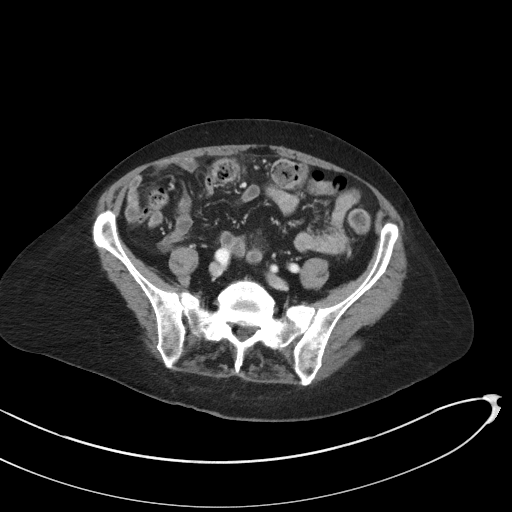
[im 44/83  soft-tissue]
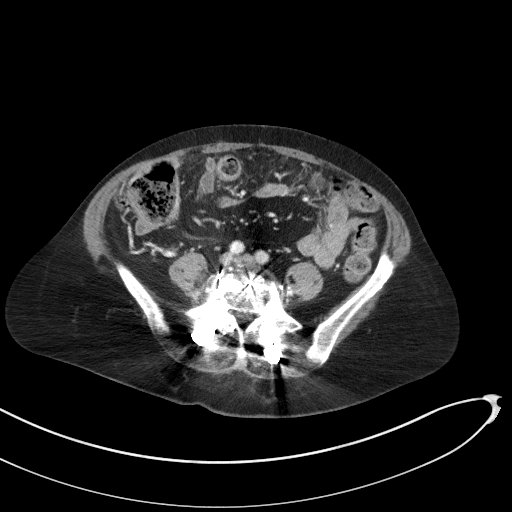
[im 52/83  soft-tissue]
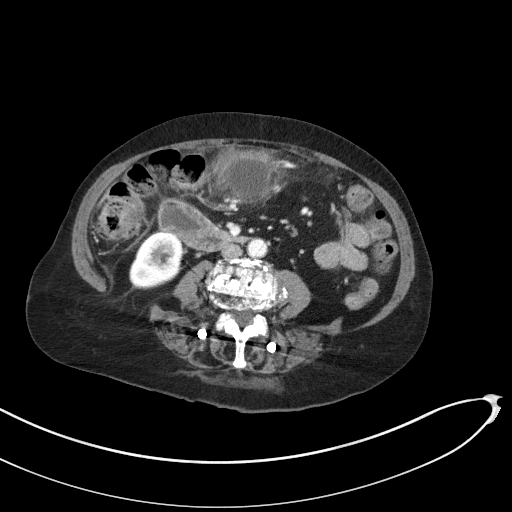
[im 57/83  soft-tissue]
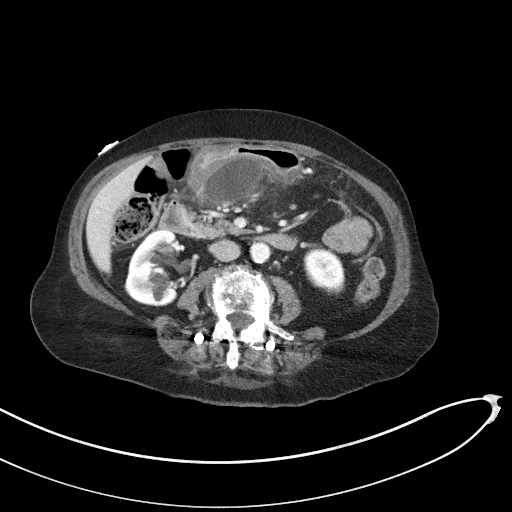
[im 57/83  bone]
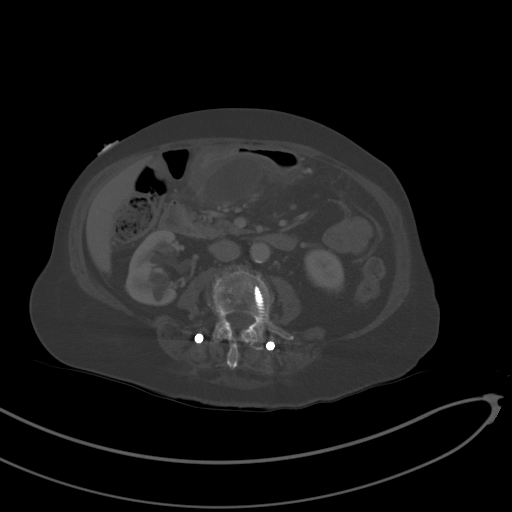
[im 65/83  soft-tissue]
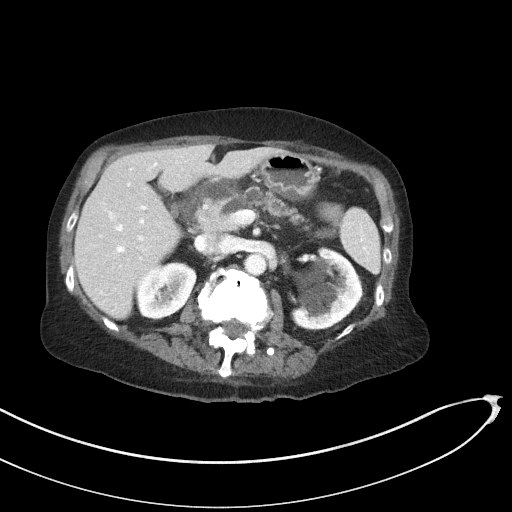
[im 70/83  soft-tissue]
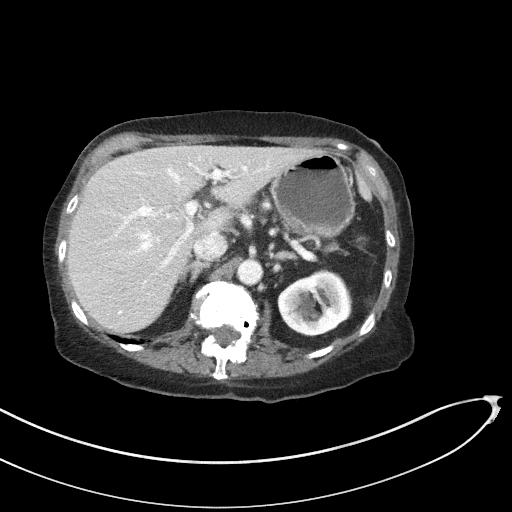
[im 78/83  soft-tissue]
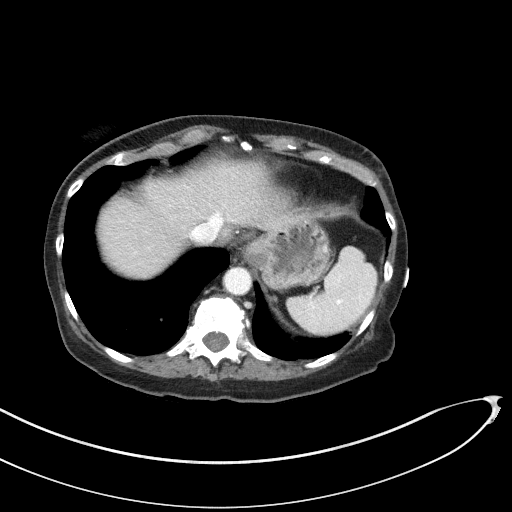

[Series 5: coronal st · coronal · 0.64mm/px · 3 of 89 slices shown]
[im 30/89  soft-tissue]
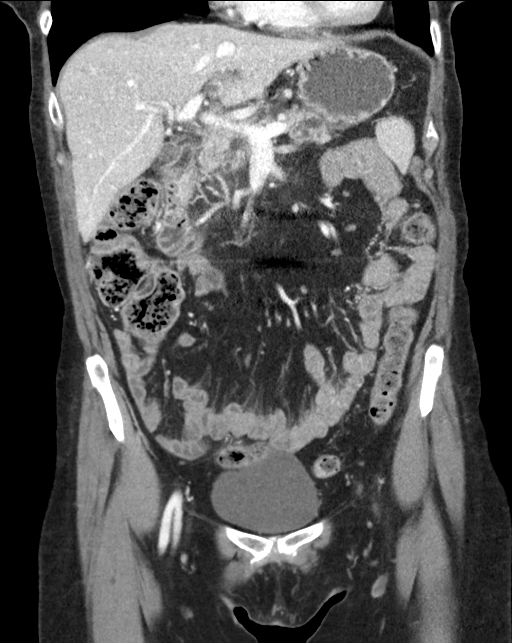
[im 40/89  soft-tissue]
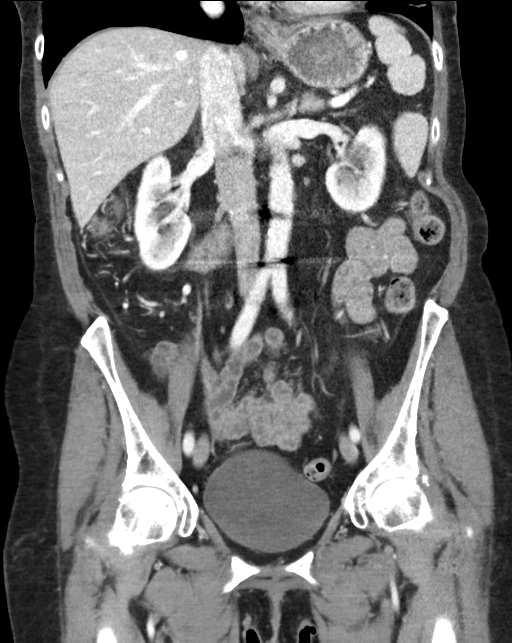
[im 49/89  soft-tissue]
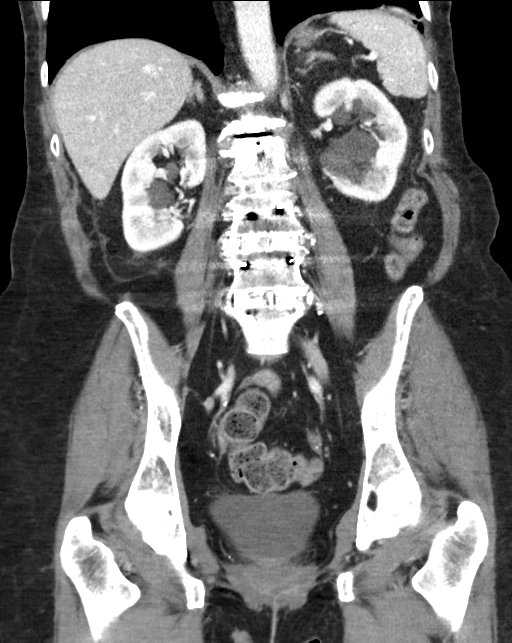

[15 of 46 positions shown; findings below may reference images not displayed]

RADIATION DOSE REDUCTION: This exam was performed according to the
departmental dose-optimization program which includes automated
exposure control, adjustment of the mA and/or kV according to
patient size and/or use of iterative reconstruction technique.

CONTRAST:  100mL OMNIPAQUE IOHEXOL 300 MG/ML  SOLN
FINDINGS: Lower chest: Lung bases are clear.

Hepatobiliary: Small cyst in the RIGHT hepatic lobe. No biliary duct
dilatation. The gallbladder is collapsed. Common bile duct normal
caliber.

Pancreas: There is duct dilatation in the mid body and tail of the
pancreas. There is abrupt termination of this duct dilatation in the
head of the pancreas (images 18-23 of series 2). These findings are
similar to comparison MRI. There is however a new elongated fluid
collection extending from just above the junction of the stomach and
duodenum continuing inferiorly to the greater curvature of stomach.
The collection is just ventral to the head of the pancreas. This
collection measures 4.0 x 3.1 cm in axial dimension (image [DATE]) and
7.9 cm in craniocaudad dimension (image 23.5). This elongated
collection has appearance of the pancreatic pseudocysts.

Spleen: Normal spleen

Adrenals/urinary tract: Adrenal glands normal. Bilateral parapelvic
cysts renal hilum. Ureters and bladder normal.

Stomach/Bowel: Stomach is normal. There is compression of the antrum
the stomach by the fluid collection described the pancreatic
section. Duodenum small-bowel normal. No bowel obstruction. Normal
colon. Rectum normal.

Vascular/Lymphatic: Abdominal aorta is normal caliber with
atherosclerotic calcification. There is no retroperitoneal or
periportal lymphadenopathy. No pelvic lymphadenopathy.

Reproductive: Uterus normal

Other: No free fluid.

Musculoskeletal: No aggressive osseous lesion
IMPRESSION: Number

1. New fluid collection extending posterior to the gastric antrum
and ventral to the pancreas. This elongated collection extends to
the greater curvature of the stomach with inflammation surrounding
the collection. Findings are most suggestive of a pancreatic
pseudocyst.
2. Chronic dilatation of the pancreatic duct with abrupt termination
of the duct dilatation in the head of the pancreas. Findings are not
changed from comparison MRI of 02/06/2021. Differential include
benign and malignant stricturing of the pancreatic duct.
3. Normal gallbladder.  No biliary duct dilatation

## 2024-01-23 IMAGING — CT CT CHEST W/O CM
2 of 4 series · 15 of 36 positions shown, 18 images · non-contrast
Comparison: None

CLINICAL DATA: History of pancreas cancer.  Staging.



[Series 2: routine chest without · axial · non-contrast · 0.69mm/px · z∈[+946,+1210]mm · 12 of 158 slices shown, 15 images]
[im 13/158  mediastinal]
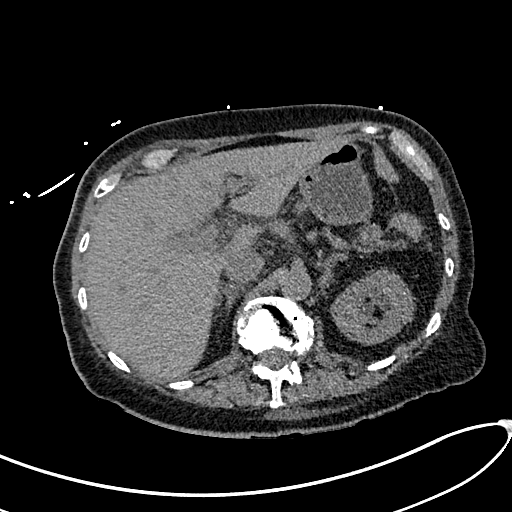
[im 13/158  lung]
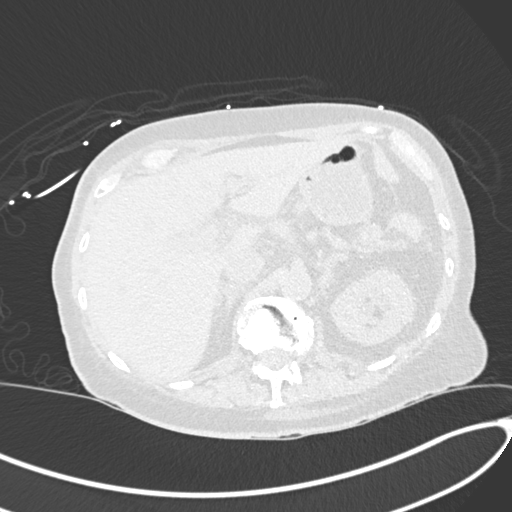
[im 25/158  lung]
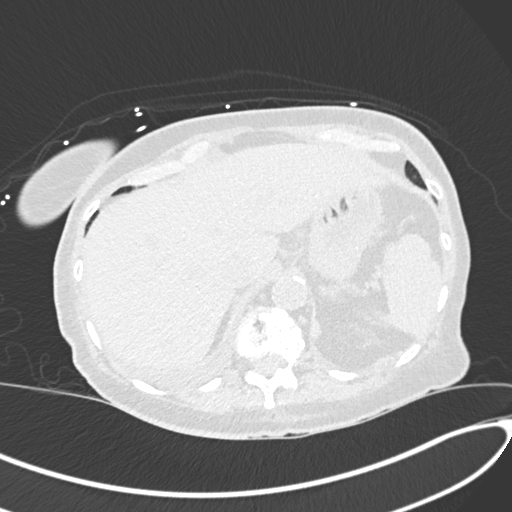
[im 37/158  lung]
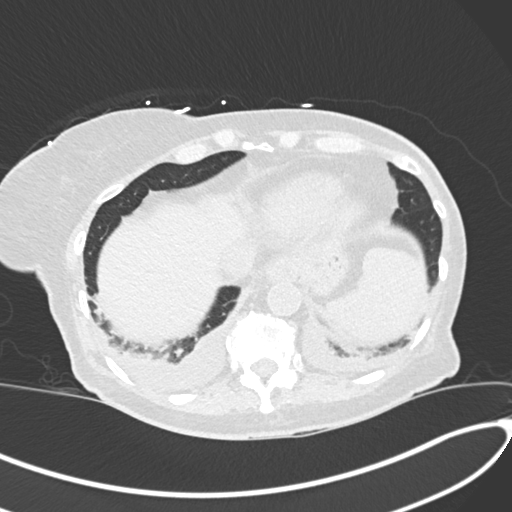
[im 49/158  lung]
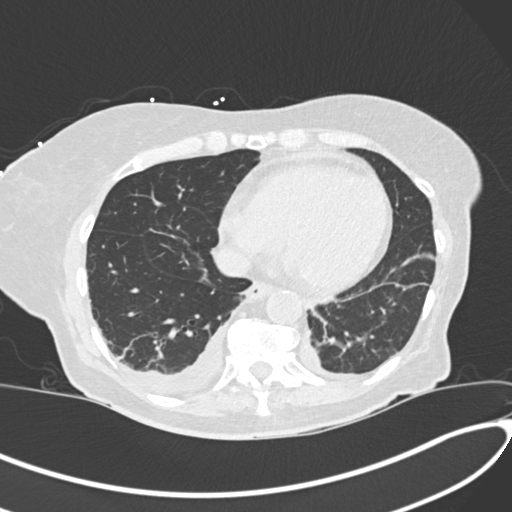
[im 61/158  mediastinal]
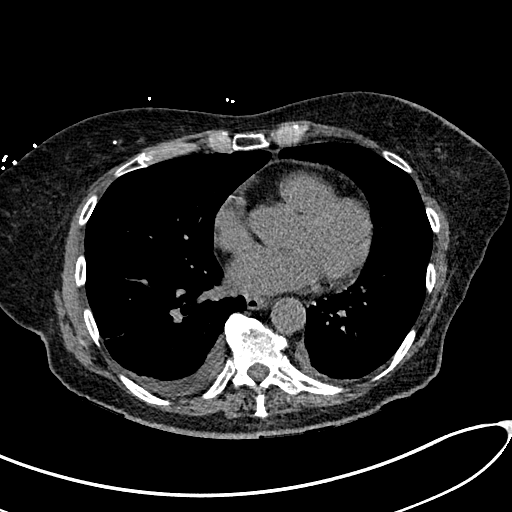
[im 61/158  lung]
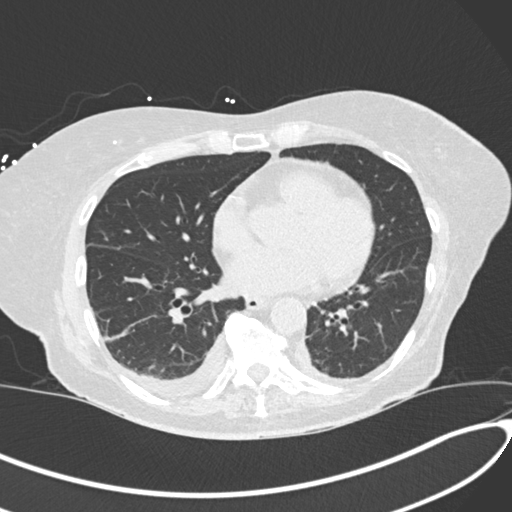
[im 73/158  lung]
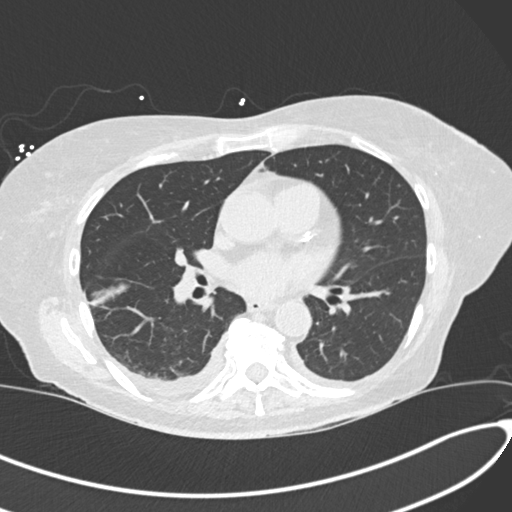
[im 85/158  lung]
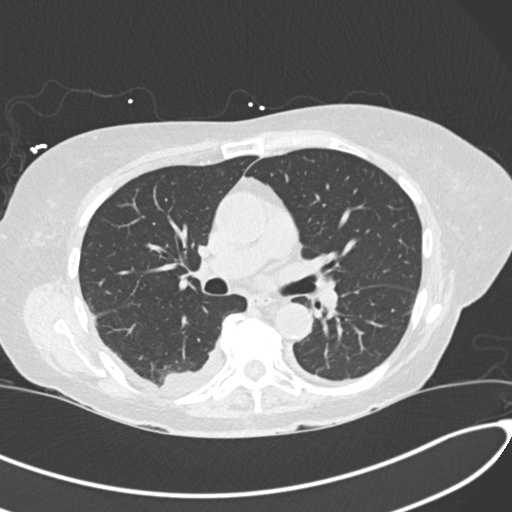
[im 97/158  lung]
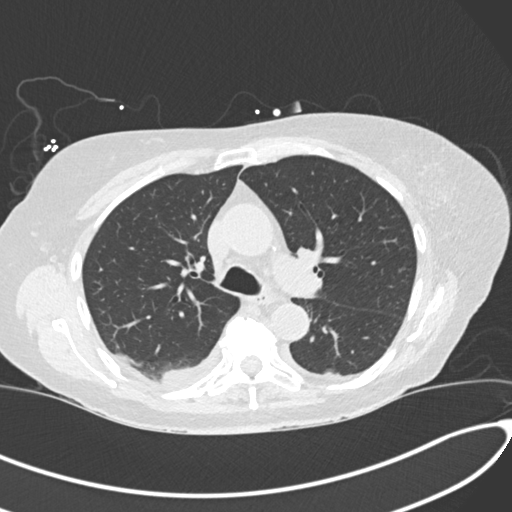
[im 109/158  mediastinal]
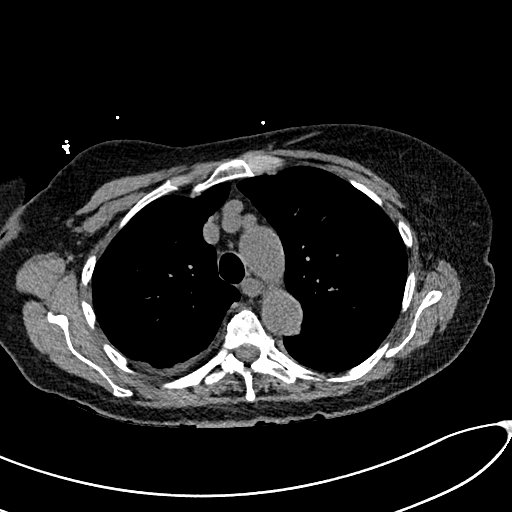
[im 109/158  lung]
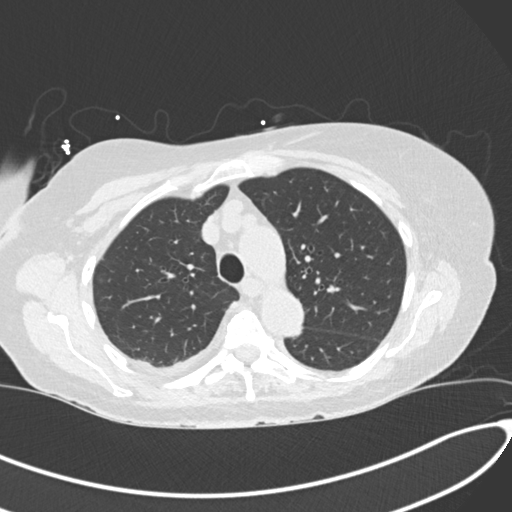
[im 121/158  lung]
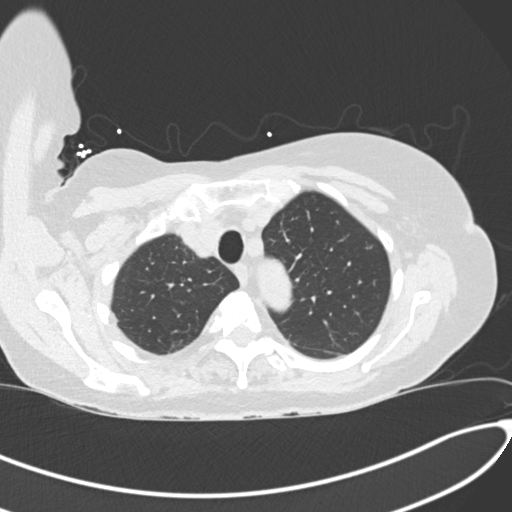
[im 133/158  lung]
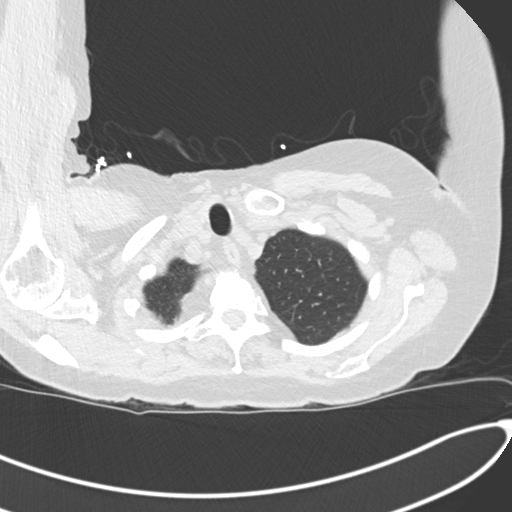
[im 145/158  lung]
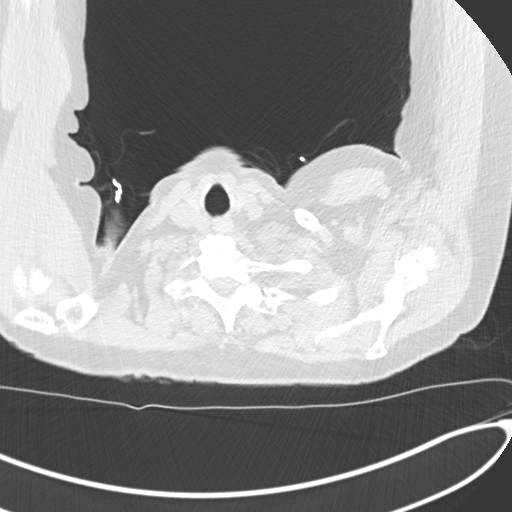

[Series 5: coronal · coronal · 0.65mm/px · 3 of 122 slices shown]
[im 25/122  lung]
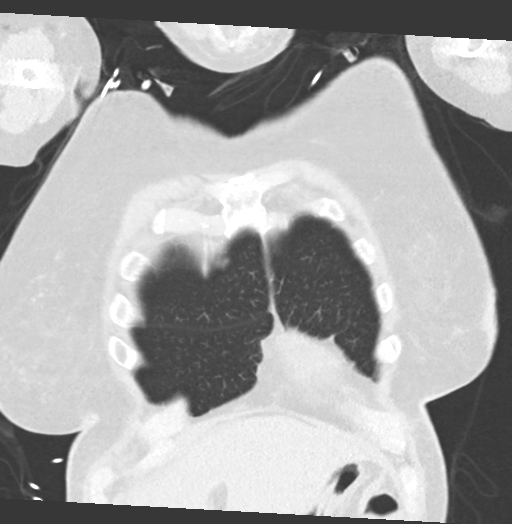
[im 49/122  lung]
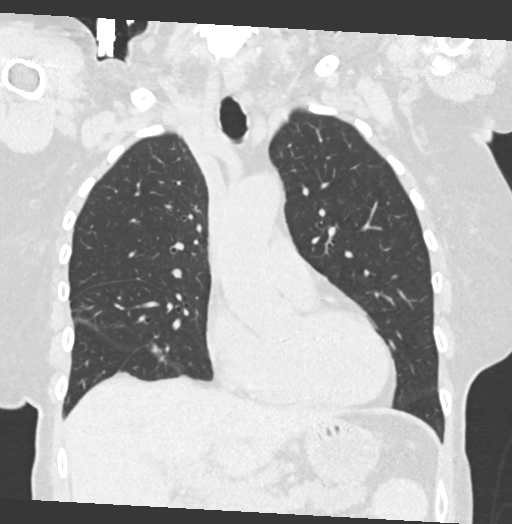
[im 73/122  lung]
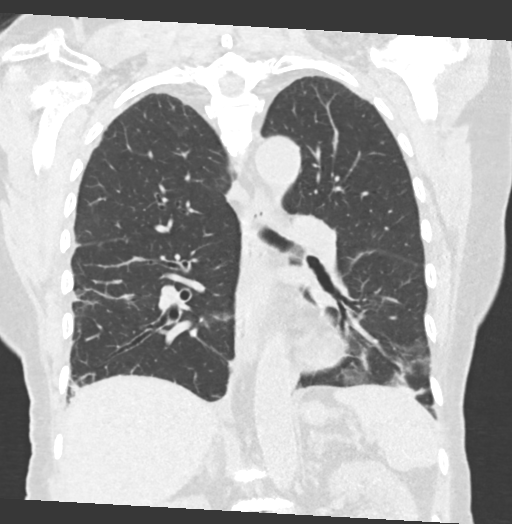

[15 of 36 positions shown; findings below may reference images not displayed]

FINDINGS: Cardiovascular: Normal heart size. No pericardial effusion. Aortic
atherosclerosis and coronary artery calcifications noted.

Mediastinum/Nodes: Thyroid gland, trachea and esophagus are
unremarkable. No enlarged axillary, supraclavicular, or mediastinal
lymph nodes. Hilar lymph nodes are suboptimally evaluated due to
lack of IV contrast material.

Lungs/Pleura: There are small bilateral pleural effusions noted,
right greater than left. Scattered areas of scar versus platelike
atelectasis noted within both lower lobes. No airspace consolidation
or pneumothorax identified. No suspicious pulmonary nodules.

Upper Abdomen: Mass within the right upper quadrant of the abdomen
around the head of pancreas and porta hepatic region is again noted
corresponding to the recently described pancreatic pseudocyst. There
is surrounding soft tissue stranding. Dilatation of the main
pancreatic duct secondary to known pancreatic neoplasm is again
noted.

Musculoskeletal: Spondylosis identified within the thoracic spine.
No suspicious bone lesions identified
IMPRESSION: 1. No evidence for metastatic disease to the chest.
2. Small bilateral pleural effusions, right greater than left.
3. Mass within the right upper quadrant of the abdomen around the
head of pancreas and porta hepatic region is again noted
corresponding to the recently described pancreatic pseudocyst.
4. Aortic Atherosclerosis ([id number]).
